# Patient Record
Sex: Female | Born: 1965 | Race: White | Hispanic: No | Marital: Married | Smoking: Former smoker
Health system: Southern US, Community
[De-identification: ages and names within clinical notes are randomized; demographics above are authoritative.]

## PROBLEM LIST (undated history)

## (undated) DIAGNOSIS — Z85850 Personal history of malignant neoplasm of thyroid: Secondary | ICD-10-CM

## (undated) DIAGNOSIS — E785 Hyperlipidemia, unspecified: Secondary | ICD-10-CM

## (undated) DIAGNOSIS — Z923 Personal history of irradiation: Secondary | ICD-10-CM

## (undated) DIAGNOSIS — R9389 Abnormal findings on diagnostic imaging of other specified body structures: Secondary | ICD-10-CM

## (undated) DIAGNOSIS — K219 Gastro-esophageal reflux disease without esophagitis: Secondary | ICD-10-CM

## (undated) DIAGNOSIS — Z9221 Personal history of antineoplastic chemotherapy: Secondary | ICD-10-CM

## (undated) DIAGNOSIS — F419 Anxiety disorder, unspecified: Secondary | ICD-10-CM

## (undated) DIAGNOSIS — E039 Hypothyroidism, unspecified: Secondary | ICD-10-CM

## (undated) DIAGNOSIS — C801 Malignant (primary) neoplasm, unspecified: Secondary | ICD-10-CM

## (undated) DIAGNOSIS — C20 Malignant neoplasm of rectum: Secondary | ICD-10-CM

## (undated) DIAGNOSIS — N840 Polyp of corpus uteri: Secondary | ICD-10-CM

## (undated) DIAGNOSIS — Z8639 Personal history of other endocrine, nutritional and metabolic disease: Secondary | ICD-10-CM

## (undated) MED FILL — Fluorouracil IV Soln 5 GM/100ML (50 MG/ML): INTRAVENOUS | Qty: 70 | Status: AC

---

## 1996-04-28 HISTORY — PX: LAPAROSCOPIC CHOLECYSTECTOMY: SUR755

## 1998-03-12 ENCOUNTER — Other Ambulatory Visit: Admission: RE | Admit: 1998-03-12 | Discharge: 1998-03-12 | Payer: Self-pay | Admitting: Obstetrics and Gynecology

## 2000-03-28 HISTORY — PX: TUBAL LIGATION: SHX77

## 2000-05-09 ENCOUNTER — Other Ambulatory Visit: Admission: RE | Admit: 2000-05-09 | Discharge: 2000-05-09 | Payer: Self-pay | Admitting: *Deleted

## 2000-06-20 ENCOUNTER — Observation Stay (HOSPITAL_COMMUNITY): Admission: AD | Admit: 2000-06-20 | Discharge: 2000-06-21 | Payer: Self-pay | Admitting: Obstetrics and Gynecology

## 2000-06-20 HISTORY — PX: OTHER SURGICAL HISTORY: SHX169

## 2000-07-19 ENCOUNTER — Ambulatory Visit (HOSPITAL_COMMUNITY): Admission: RE | Admit: 2000-07-19 | Discharge: 2000-07-19 | Payer: Self-pay | Admitting: Obstetrics and Gynecology

## 2000-10-10 ENCOUNTER — Ambulatory Visit (HOSPITAL_COMMUNITY): Admission: RE | Admit: 2000-10-10 | Discharge: 2000-10-10 | Payer: Self-pay | Admitting: Obstetrics and Gynecology

## 2000-12-26 ENCOUNTER — Inpatient Hospital Stay (HOSPITAL_COMMUNITY): Admission: AD | Admit: 2000-12-26 | Discharge: 2000-12-29 | Payer: Self-pay | Admitting: Obstetrics and Gynecology

## 2000-12-26 ENCOUNTER — Encounter (INDEPENDENT_AMBULATORY_CARE_PROVIDER_SITE_OTHER): Payer: Self-pay | Admitting: *Deleted

## 2001-02-02 ENCOUNTER — Other Ambulatory Visit: Admission: RE | Admit: 2001-02-02 | Discharge: 2001-02-02 | Payer: Self-pay | Admitting: Obstetrics and Gynecology

## 2002-11-28 ENCOUNTER — Ambulatory Visit (HOSPITAL_COMMUNITY): Admission: RE | Admit: 2002-11-28 | Discharge: 2002-11-28 | Payer: Self-pay | Admitting: Internal Medicine

## 2002-11-28 ENCOUNTER — Encounter: Payer: Self-pay | Admitting: Internal Medicine

## 2004-04-20 ENCOUNTER — Other Ambulatory Visit: Admission: RE | Admit: 2004-04-20 | Discharge: 2004-04-20 | Payer: Self-pay | Admitting: Obstetrics and Gynecology

## 2004-08-13 ENCOUNTER — Encounter (INDEPENDENT_AMBULATORY_CARE_PROVIDER_SITE_OTHER): Payer: Self-pay | Admitting: *Deleted

## 2004-08-13 ENCOUNTER — Ambulatory Visit (HOSPITAL_COMMUNITY): Admission: RE | Admit: 2004-08-13 | Discharge: 2004-08-13 | Payer: Self-pay | Admitting: Obstetrics and Gynecology

## 2004-08-13 HISTORY — PX: OTHER SURGICAL HISTORY: SHX169

## 2006-09-14 ENCOUNTER — Encounter (HOSPITAL_COMMUNITY): Admission: RE | Admit: 2006-09-14 | Discharge: 2006-10-14 | Payer: Self-pay | Admitting: Endocrinology

## 2007-07-24 ENCOUNTER — Ambulatory Visit (HOSPITAL_COMMUNITY): Admission: RE | Admit: 2007-07-24 | Discharge: 2007-07-24 | Payer: Self-pay | Admitting: Family Medicine

## 2007-07-26 ENCOUNTER — Ambulatory Visit (HOSPITAL_COMMUNITY): Admission: RE | Admit: 2007-07-26 | Discharge: 2007-07-26 | Payer: Self-pay | Admitting: Family Medicine

## 2010-01-27 ENCOUNTER — Ambulatory Visit (HOSPITAL_COMMUNITY): Admission: RE | Admit: 2010-01-27 | Discharge: 2010-01-27 | Payer: Self-pay | Admitting: Obstetrics and Gynecology

## 2010-08-13 NOTE — Op Note (Signed)
[facility]  Patient:    LAVAUGHN, BISIG                      MRN: [id number] Proc. Date: 06/20/00 Adm. Date:  [id number] Disc. Date: [id number] Attending:  Osborn Coho                           Operative Report  PREOPERATIVE DIAGNOSES:       1. Intrauterine pregnancy at 13 weeks estimated                                  gestational age.                               2. Incarcerated uterus.                               3. Urinary retention.  POSTOPERATIVE DIAGNOSES:      1. Intrauterine pregnancy at 13 weeks estimated                                  gestational age.                               2. Incarcerated uterus.                               3. Urinary retention.  PROCEDURE:                    Reduction of incarcerated uterus.  SURGEON:                      Mark E. Dareen Piano, M.D.  ANESTHESIA:                   Spinal.  ESTIMATED BLOOD LOSS:         Minimal.  DRAINS:                       Foley to bedside drainage.  COMPLICATIONS:                None.  INDICATIONS:                  The patient is a 45 year old white female, G4, P3, who presented to the office earlier today complaining of the inability to void since 3:00 a.m.  The patient was complaining of exquisite pain at that time.  She was felt to have an incarcerated uterus.  Her bladder was drained and attempts to release the incarcerated uterus in the office were made.  The patient left with a pessary in her vagina.  The patient tried to void later in the day, and the pessary fell out.  She had been  unable to void since being catheterized in the office this morning.  The patient again was becoming painful.  A Foley catheter was placed in the emergency room and 850 cc of urine was found.  Urinalysis was normal.  The patient had a retroverted uterus,  with the cervix being very anteverted up near the bladder neck.  DESCRIPTION OF PROCEDURE:     The patient was taken to  the operating room, where a spinal anesthetic was placed.  At that point, she was placed in the dorsal lithotomy position and prepped with Hibiclens.  She was draped with blue and green sheets.  At that point, a tenaculum was applied to the anterior cervical lip and a hand placed in the vagina.  The cervix was then pulled posteriorly with the hand elevating the fundus of the uterus.  The released the incarcerated uterus and the cervix was then placed posteriorly.  At that point, a sponge was placed in the vagina and the patient will be observed overnight with the Foley catheter.  Fetal heart tones were in the 160s at the conclusion of this procedure.  The patient tolerated the procedure well.  She was taken to the recovery room in stable condition. DD:  06/20/00 TD:  06/21/00 Job: [id number]

## 2010-08-13 NOTE — Op Note (Signed)
NAMEELIF, Samantha Reynolds               ACCOUNT NO.:  [id number]   MEDICAL RECORD NO.:  [id number]          PATIENT TYPE:  AMB   LOCATION:  SDC                           FACILITY:  [facility]   PHYSICIAN:  Michelle L. Grewal, M.D.DATE OF BIRTH:  1966-03-10   DATE OF PROCEDURE:  08/13/2004  DATE OF DISCHARGE:                                 OPERATIVE REPORT   PREOPERATIVE DIAGNOSIS:  Menorrhagia.   POSTOPERATIVE DIAGNOSIS:  Menorrhagia.   PROCEDURE:  Dilatation and curettage, hysteroscopy, ThermaChoice endometrial  ablation.   SURGEON:  Michelle L. Vincente Poli, M.D.   ANESTHESIA:  General.   PROCEDURE:  The patient was taken to the operating room.  She was intubated  without difficulty.  She was prepped and draped in the usual sterile  fashion.  An in-and-out catheter was used to empty the bladder.  Exam under  anesthesia revealed her uterus was approximately nine to 10 weeks' size and  retroverted.  A speculum was inserted into the vagina, the cervix was  grasped with a tenaculum, and a paracervical block was performed in the  standard fashion at 5 and 7 o'clock.  The uterus was gently sounded to 10 cm  and the [facility] dilators were then used to gently dilate the internal cervical  os.  The diagnostic hysteroscope was inserted one time without difficulty  with excellent visualization.  We noted that the patient had a very large  endometrial cavity that was tortuous.  She did have some polypoid areas kind  of down around near the lower uterine segment and the endocervix, but  otherwise I found no polyps or fibroids.  The hysteroscope was removed and a  thorough sharp uterine curettage was performed.  All tissue was sent to  pathology.  At that point, then the ThermaChoice array was inserted and  according to the manufacturer's specifications, we filled up the balloon  with approximately 15 mL of D5W.  Once we started the ablation cycle at the  point of three minutes' ablation, the machine read an  over-read area stating  overheat, and I discontinued the cycle.  I immediately removed the  balloon.  The balloon was intact.  We then at that point decided to use  another balloon.  A brand new ThermaChoice balloon was used and again  according to the manufacturer's specifications, I did insert it and prior to  even starting the ablation cycle, the machine immediately said over-read  error, overheat error, and I discontinued the procedure completely.  I do  not know if this error is due to an intrinsic problem with the generator  machine versus the possibility that she had such a large cavity that the  balloon was not able to contact completely with the endometrium.  I was  unable to get a normal ablation started.  That may have been the  possibility.  I do not think the patient had a uterine perforation with this  pressure maintained in the normal range throughout the entire procedure, and  the hysteroscope had a 40 mL deficit.  At the end of the procedure, there  was no  active bleeding noted.  She has the normal drainage which appeared to  be hysteroscopic fluid.  The intact balloon was removed a second time as  well.  All sponge, lap and instrument counts are correct x2.  After the  procedure, I went in and discussed the entire procedure with her husband.  Given that there was a three-minute ablation cycle, I feel that she may have  benefitted a little bit at least from the procedure, even though we were  unable to do the complete eight-minute ablation.  At the end of the  procedure, the patient was extubated and went to  the recovery room, where she was very comfortable.  She was given Toradol.  She will be discharged home with Darvocet.  She was advised to call should  she have any nausea, vomiting, severe abdominal pain or fever, and she will  follow up in two weeks for postop check.      MLG/MEDQ  D:  08/13/2004  T:  08/13/2004  Job:  [id number]

## 2010-08-13 NOTE — Op Note (Signed)
[facility]  Patient:    Samantha Reynolds, Samantha Reynolds Visit Number: [id number] MRN: [id number]          Service Type: OBS Location: 910A [id number] [id number] Attending Physician:  Marcelle Overlie Dictated by:   Miguel Aschoff, M.D. Proc. Date: 12/27/00 Admit Date:  12/26/2000                             Operative Report  PREOPERATIVE DIAGNOSIS:         Desired sterilization postpartum.  POSTOPERATIVE DIAGNOSIS:        Desired sterilization postpartum.  OPERATION:                      Postpartum [place] sterilization.  SURGEON:                        Miguel Aschoff, M.D.  ANESTHESIA:                     Epidural.  COMPLICATIONS:                  None.  INDICATIONS:                    The patient is a 45 year old old white female, gravida 5, para 4-0-1-4 who delivered on the morning of August 31, 2000.  The patient has requested that a sterilization be performed for socioeconomic reasons and has given her informed consent for bilateral tubal sterilization.  DESCRIPTION OF PROCEDURE:       After satisfactory epidural anesthesia a small infraumbilical incision was made after the patient had been prepped and draped in the usual sterile fashion.  Incision was extended down to subcutaneous tissue, until the fascia was identified. The fascia was then incised and the peritoneum revealed below.  The peritoneum was then entered carefully avoiding underlying structures.  Using [employer] retractors it was possible to bring the left tube into view.  The tube was traced back to its fimbriated end, then grasped on its mid portion and doubly ligated with two ligatures of 0 plain gut.  The area of tube above the ligatures was then excised and the tubal stumps were cauterized. Then the identical procedure was carried out on the right side again with the tube being identified and traced out to its fimbriated end and then again a loop being created in the mid portion and doubly ligated with two  ligatures of 0 plain gut.  After this was done inspection was made for hemostasis.  Hemostasis appeared to be excellent and at this point the abdomen was closed.  The parietal peritoneum was closed using pursestring suture of 0 Vicryl. The fascia was closed 1 interlocking 0 Vicryl.  The subcutaneous tissue was closed using an interrupted suture of 0 Vicryl and then the skin incision was closed using subcuticular 4.0 Vicryl. The estimated blood loss was less than 20 cc.  The patient tolerated the procedure well and went to the recovery room in satisfactory condition. Dictated by:   Miguel Aschoff, M.D. Attending Physician:  Marcelle Overlie DD:  12/27/00 TD:  12/27/00 Job: [id number]

## 2011-02-03 ENCOUNTER — Other Ambulatory Visit (HOSPITAL_COMMUNITY): Payer: Self-pay | Admitting: Obstetrics and Gynecology

## 2011-02-03 DIAGNOSIS — Z139 Encounter for screening, unspecified: Secondary | ICD-10-CM

## 2011-02-08 ENCOUNTER — Ambulatory Visit (HOSPITAL_COMMUNITY)
Admission: RE | Admit: 2011-02-08 | Discharge: 2011-02-08 | Disposition: A | Discharge status: Home / Self Care | Payer: 59 | Source: Ambulatory Visit | Attending: Obstetrics and Gynecology | Admitting: Obstetrics and Gynecology

## 2011-02-08 DIAGNOSIS — Z1231 Encounter for screening mammogram for malignant neoplasm of breast: Secondary | ICD-10-CM | POA: Insufficient documentation

## 2011-02-08 DIAGNOSIS — Z139 Encounter for screening, unspecified: Secondary | ICD-10-CM

## 2011-03-09 ENCOUNTER — Ambulatory Visit (HOSPITAL_COMMUNITY)
Admission: RE | Admit: 2011-03-09 | Discharge: 2011-03-09 | Disposition: A | Discharge status: Home / Self Care | Payer: 59 | Source: Ambulatory Visit | Attending: Podiatry | Admitting: Podiatry

## 2011-03-09 DIAGNOSIS — R262 Difficulty in walking, not elsewhere classified: Secondary | ICD-10-CM | POA: Insufficient documentation

## 2011-03-09 DIAGNOSIS — IMO0001 Reserved for inherently not codable concepts without codable children: Secondary | ICD-10-CM | POA: Insufficient documentation

## 2011-03-09 DIAGNOSIS — M25579 Pain in unspecified ankle and joints of unspecified foot: Secondary | ICD-10-CM | POA: Insufficient documentation

## 2011-03-09 DIAGNOSIS — M722 Plantar fascial fibromatosis: Secondary | ICD-10-CM | POA: Insufficient documentation

## 2011-03-09 NOTE — Progress Notes (Signed)
Physical Therapy Evaluation  Patient Details  Name: Samantha Reynolds MRN: [id number] Date of Birth: 02/12/1966  Today's Date: 03/09/2011 Time: 4098-1191 Time Calculation (min): 46 min Charges: 1 eval Visit#: 1  of 6   Re-eval: 04/08/11 Assessment Diagnosis: B plantar fascitis Next MD Visit: unscheduled  Prior Therapy: None  Subjective Symptoms/Limitations Symptoms: Pt reports that she had started notice pain to the bottom of her R foot about a year ago.  She has had conservative treatment including cortisone injections over the past year (3 to her L, 2 to her R).  Over the past year she started to notice more pain in her L foot.  She has increased pain when She has been educated on orthotics.  She has bought OTC insoles and new shoes.  Pt pain is 4-5/10 to her L foot, R no pain.  Range: 0-10/10.  Nature: Throbbing pain from middle of arch to back of her heel.  Aggravating: AM and over an hour of walking.  Alleviating: Ice, rest. She has not tried heating her foot.  Her goals are to reduce the pain and feel comfortable with her exercises so she does not re-injure herself.  Pain Assessment Currently in Pain?: Yes Pain Score:   5 Pain Location: Heel Pain Orientation: Right;Left Pain Type: Acute pain  Objective/Assessment Diagnosis B plantar fascitis Next MD Visit unscheduled  Prior Therapy None Prior Function Vocation Full time employment Vocation Requirements Works in admissions  Leisure Hobbies-yes (Comment) Comments She enjoys shopping, watching her children play sports at the college and high school level. Flexibility Hoovers Positive Thomas Positive 90/90 Positive Functional Tests Functional Tests Observation: Decreased (R>L) flexibility to her B: hamstrings, hip flexors, quadricpes, gastroc and solues complex Functional Tests Palpation: Increased pain with fascial restricitons to the medial insertion of plantar fascia on her L foot.  Without pain to her R plantar fascial  insertion  Increased calcaneal mobility. Functional Tests LEFS:  RLE Strength Right Ankle Dorsiflexion 5/5 Right Ankle Plantar Flexion 5/5 Right Ankle Inversion 4/5 Right Ankle Eversion 4/5 LLE Assessment LLE Assessment X LLE Strength LLE Overall Strength Comments ankle 5/5 thorughout   Exercise/Treatments Stretches Active Hamstring Stretch: 1 rep;30 seconds;Limitations Active Hamstring Stretch Limitations: BLE Passive Hamstring Stretch: 1 rep;30 seconds;Limitations Passive Hamstring Stretch Limitations: BLE long sitting on bed Hip Flexor Stretch: 1 rep;30 seconds;Limitations Hip Flexor Stretch Limitations: BLE with leg hanging of EOB Gastroc Stretch: 1 rep;30 seconds;Limitations Gastroc Stretch Limitations: BLE Soleus Stretch: 1 rep;30 seconds;Limitations Soleus Stretch Limitations: BLE Towel scrunches x10 minutes Physical Therapy Assessment and Plan PT Assessment and Plan Clinical Impression Statement: Pt is a 45 year old female who is referred to PT secondary to plantar fascial pain. After examination it was found that she has current body structure impairments including increased pain, decreased LE flexibility, impaired gait, and decreased percieved functional ability which is limiting her ability to fully participate in her everyday activities.  She will benefit from skilled OPPT in order to address the above impairments in order to maximize function and improve quality of life.  PT Frequency: Min 2X/week PT Duration: Other (comment) (3 weeks) PT Treatment/Interventions: Gait training;Stair training;Functional mobility training;Therapeutic activities;Therapeutic exercise;Neuromuscular re-education;Patient/family education;Other (comment) (Manual techniques, MYK, and modalities for pain control) PT Plan: MYK techniques, modalities for pain control, ankle t-band, towel inversion/eversion as needed.     Goals Home Exercise Program Pt will Perform Home Exercise Program:  Independently PT Short Term Goals Time to Complete Short Term Goals: 3 weeks PT Short Term Goal 1:  Pt will report pain less than or equal to 3/10 for 75% of her day while walking for greater then an hour for improved quality of life.  PT Short Term Goal 2: Pt will improve her LE flexibility and decrease her fascial restrictions to her plantar region to tolerate walking for greater than an hour without pain.   PT Short Term Goal 3: Pt will improve her LEFS by 9 points for improved percieved functional ability.   Problem List Patient Active Problem List  Diagnoses  . Plantar fascial fibromatosis  . Pain in joint, ankle and foot    PT - End of Session Activity Tolerance: Patient tolerated treatment well   Constantin Hillery 03/09/2011, 7:08 PM  Physician Documentation Your signature is required to indicate approval of the treatment plan as stated above.  Please sign and either send electronically or make a copy of this report for your files and return this physician signed original.   Please mark one 1.__approve of plan  2. ___approve of plan with the following conditions.   ______________________________                                                          _____________________ Physician Signature                                                                                                             Date

## 2011-03-15 ENCOUNTER — Ambulatory Visit (HOSPITAL_COMMUNITY)
Admission: RE | Admit: 2011-03-15 | Discharge: 2011-03-15 | Disposition: A | Discharge status: Home / Self Care | Payer: 59 | Source: Ambulatory Visit | Attending: Podiatry | Admitting: Podiatry

## 2011-03-15 NOTE — Progress Notes (Signed)
Physical Therapy Treatment Patient Details  Name: Samantha Reynolds MRN: [id number] Date of Birth: February 18, 1966  Today's Date: 03/15/2011 Time: 5621-3086 Time Calculation (min): 42 min Visit#: 2  of 6   Re-eval: 04/08/11 Charges: Therex x 38'  Subjective: Symptoms/Limitations Symptoms: Pt reports that she is only having in the left foot. Pt reprots taht she may have overdone it with the exercises. Pain Assessment Currently in Pain?: Yes Pain Score:   8 Pain Location: Ankle Pain Orientation: Left;Posterior   Exercise/Treatments Stretches Active Hamstring Stretch: 2 reps;30 seconds Active Hamstring Stretch Limitations: BLE Passive Hamstring Stretch: 2 reps;30 seconds Gastroc Stretch: 2 reps;30 seconds Gastroc Stretch Limitations: BLE Soleus Stretch: 2 reps;30 seconds Soleus Stretch Limitations: BLE Standing Other Standing Knee Exercises: Plantar stretch 2x30" Grn tband ankle all directions x 10 each  Physical Therapy Assessment and Plan PT Assessment and Plan Clinical Impression Statement: Pt completes stretches and new exercises with minimal difficulty and minimal need for cueing. Began plantar stretch on step to decrease plantar fascia tightness. BEgan ankle tband exercises to increase ankle strenght/stability. Pt reports pain decrease to 4/10 at end of session. PT Plan: Continue to progress per PT POC. Address need for manual techniques to decrease pain/tightness next session.     Problem List Patient Active Problem List  Diagnoses  . Plantar fascial fibromatosis  . Pain in joint, ankle and foot    PT - End of Session Activity Tolerance: Patient tolerated treatment well General Behavior During Session: [facility] for tasks performed Cognition: [facility] for tasks performed  Antonieta Iba 03/15/2011, 5:36 PM

## 2011-03-17 ENCOUNTER — Ambulatory Visit (HOSPITAL_COMMUNITY)
Admission: RE | Admit: 2011-03-17 | Discharge: 2011-03-17 | Disposition: A | Discharge status: Home / Self Care | Payer: 59 | Source: Ambulatory Visit | Attending: Podiatry | Admitting: Podiatry

## 2011-03-17 NOTE — Progress Notes (Signed)
Physical Therapy Treatment Patient Details  Name: Samantha Reynolds MRN: [id number] Date of Birth: 1965-12-10  Today's Date: 03/17/2011 Time: 4098-1191 Time Calculation (min): 36 min Visit#: 3  of 6   Re-eval: 04/08/11  Charge: therex: 21 min [name] 5 min  Manual 10 min  Subjective: Symptoms/Limitations Symptoms: The soreness is decreasing but her pain remains the same.   Pain Assessment Pain Score:   5 Pain Location: Foot Pain Orientation: Left Pain Type: Acute pain  Objective:  Exercise/Treatments Ankle Exercises Ankle Plantar Flexion:  (Plantar Fascia stretch 1x 1 min) Heel Raises:  (Slant board stretch 1'x 2)  Towel Inversion/Eversion:  (sidelying in and eversion with 3# x 10)  Modalities Modalities: Ultrasound Manual Therapy Manual Therapy: Other (comment) Other Manual Therapy: massage to forefoot to decrease tension. Ultrasound Ultrasound Location: medial aspect of L hind foot. Ultrasound Parameters: 1.2 w/cm2; 3 MgHz x 5 min.  Physical Therapy Assessment and Plan PT Assessment and Plan Clinical Impression Statement: Changed modalities to US/STM to gastroc and plantar surface of foot.  Tightness resolved medial gastroc region, decreased adhesions plantar surface following STM.  PT Plan: assess pain releif following session.  Continue progressing per POC.    Goals    Problem List Patient Active Problem List  Diagnoses  . Plantar fascial fibromatosis  . Pain in joint, ankle and foot    PT - End of Session Activity Tolerance: Patient tolerated treatment well General Behavior During Session: [facility] for tasks performed Cognition: [facility] for tasks performed  Juel Burrow 03/17/2011, 5:46 PM

## 2011-03-23 ENCOUNTER — Ambulatory Visit (HOSPITAL_COMMUNITY)
Admission: RE | Admit: 2011-03-23 | Discharge: 2011-03-23 | Disposition: A | Discharge status: Home / Self Care | Payer: 59 | Source: Ambulatory Visit | Attending: Family Medicine | Admitting: Family Medicine

## 2011-03-23 NOTE — Progress Notes (Signed)
Physical Therapy Treatment Patient Details  Name: Samantha Reynolds MRN: [id number] Date of Birth: 04-20-1965  Today's Date: 03/23/2011 Time: 4098-1191 Time Calculation (min): 35 min Visit#: 4  of 8   Re-eval: 04/08/11  Charge: therex 15 min [name] 10 min Manual 10 min  Subjective: Symptoms/Limitations Symptoms: Pt stated she had less pain following last session but unable to report if the relief lasted.  She was feeling good on Friday but up on feet all weekend for Christmas and has increased pain now LLE 5/10 R 2/10.  Pt wearing new pair of [employer] running shoes she reported they are specialiized for plantar fascia with orthotics stated they are helping with pain. Pain Assessment Currently in Pain?: Yes Pain Score:   5 Pain Location: Foot Pain Orientation: Left;Right  Objective:  Exercise/Treatments Stretches Gastroc Stretch: 3 reps;30 seconds Gastroc Stretch Limitations: BLE Soleus Stretch: 30 seconds;3 reps Soleus Stretch Limitations: BLE Standing Other Standing Knee Exercises: Plantar stretch 3.x30"   Modalities Modalities: Ultrasound Manual Therapy Other Manual Therapy: message gastroc to forefoot to decrease tension. Ultrasound Ultrasound Location: medial aspect of B hind foot Ultrasound Parameters: 3 MHz continous 1.2 w/cm2 x 5 min each Ultrasound Goals: Pain  Physical Therapy Assessment and Plan PT Assessment and Plan Clinical Impression Statement: Continued with US/STM modalitiy to gastroc and plantar surface of both feet and stretches to reduce tightness.  Pt stated pain relieved at end of session. PT Plan: Continue progressing per POC.    Goals    Problem List Patient Active Problem List  Diagnoses  . Plantar fascial fibromatosis  . Pain in joint, ankle and foot    PT - End of Session Activity Tolerance: Patient tolerated treatment well General Behavior During Session: [facility] for tasks performed Cognition: [facility] for tasks performed  Juel Burrow 03/23/2011, 5:52 PM

## 2011-03-24 ENCOUNTER — Ambulatory Visit (HOSPITAL_COMMUNITY)
Admission: RE | Admit: 2011-03-24 | Discharge: 2011-03-24 | Disposition: A | Discharge status: Home / Self Care | Payer: 59 | Source: Ambulatory Visit | Attending: Family Medicine | Admitting: Family Medicine

## 2011-03-24 NOTE — Progress Notes (Signed)
Physical Therapy Treatment Patient Details  Name: Samantha Reynolds MRN: [id number] Date of Birth: [birth year]  Today's Date: 03/24/2011 Time: 1650-1730 Time Calculation (min): 40 min Visit#: 5  of 8   Re-eval: 04/08/11  Charge: therex [name] 10 min Manual 10 min  Subjective: Symptoms/Limitations Symptoms: Was feeling good following last session with the [name], my R foot was totally pain free but noticed L foot hurt on the outside.  I wasn't on my feet as much as I thought I was going to be (first day back to work since Christmas break).  Pain scale R 1/10, L 5/10. Pain Assessment Currently in Pain?: Yes Pain Score:   5 Pain Location: Foot Pain Orientation: Left Multiple Pain Sites: Yes  Objective:  Exercise/Treatments Stretches Gastroc Stretch: 3 reps;30 seconds Gastroc Stretch Limitations: BLE Soleus Stretch: 30 seconds;3 reps Soleus Stretch Limitations: BLE Standing Other Standing Knee Exercises: Plantar stretch 3.x30"   Modalities Modalities: Ultrasound Ultrasound Ultrasound Location: B LE heels Ultrasound Parameters: 3 MHz continous 1.2 w/cm2 x 5 min each foot Ultrasound Goals: Pain  Physical Therapy Assessment and Plan PT Assessment and Plan Clinical Impression Statement: Continued with US/STM modalitiy to gastroc and plantar surface of both feet and stretches to reduce tightness. Able to reduce tightness B gastrocs with manual STM.  Pt stated pain relieved at end of session. PT Plan: Begin SLS barefoot on foam to help increase intrinsic muscular support next session    Goals Home Exercise Program Pt will Perform Home Exercise Program: Independently PT Short Term Goals Time to Complete Short Term Goals: 3 weeks PT Short Term Goal 1: Pt will report pain less than or equal to 3/10 for 75% of her day while walking for greater then an hour for improved quality of life.  PT Short Term Goal 2: Pt will improve her LE flexibility and decrease her fascial restrictions to  her plantar region to tolerate walking for greater than an hour without pain.   PT Short Term Goal 3: Pt will improve her LEFS by 9 points for improved percieved functional ability.   Problem List Patient Active Problem List  Diagnoses  . Plantar fascial fibromatosis  . Pain in joint, ankle and foot    PT - End of Session Activity Tolerance: Patient tolerated treatment well General Cognition: [facility] for tasks performed  Juel Burrow 03/24/2011, 5:33 PM

## 2011-03-25 ENCOUNTER — Ambulatory Visit (HOSPITAL_COMMUNITY): Payer: 59

## 2011-03-29 DIAGNOSIS — C801 Malignant (primary) neoplasm, unspecified: Secondary | ICD-10-CM

## 2011-03-29 HISTORY — DX: Malignant (primary) neoplasm, unspecified: C80.1

## 2011-03-30 ENCOUNTER — Ambulatory Visit (HOSPITAL_COMMUNITY)
Admission: RE | Admit: 2011-03-30 | Discharge: 2011-03-30 | Disposition: A | Discharge status: Home / Self Care | Payer: 59 | Source: Ambulatory Visit | Attending: Podiatry | Admitting: Podiatry

## 2011-03-30 DIAGNOSIS — R262 Difficulty in walking, not elsewhere classified: Secondary | ICD-10-CM | POA: Insufficient documentation

## 2011-03-30 DIAGNOSIS — IMO0001 Reserved for inherently not codable concepts without codable children: Secondary | ICD-10-CM | POA: Insufficient documentation

## 2011-03-30 DIAGNOSIS — M25579 Pain in unspecified ankle and joints of unspecified foot: Secondary | ICD-10-CM | POA: Insufficient documentation

## 2011-03-30 NOTE — Progress Notes (Signed)
Physical Therapy Treatment Patient Details  Name: Samantha Reynolds MRN: [id number] Date of Birth: Nov 29, 1965  Today's Date: 03/30/2011 Time: 7829-5621 Time Calculation (min): 39 min Visit#: 6  of 8   Re-eval: 04/08/11  Charge: therex 19 min [name] 10 min Manual 10 min  Subjective: Symptoms/Limitations Symptoms: Pt stated she's feeling good today, have taken it easy last couple days.  She reported feet felt good following [name] last session.  Pain scale R 1/10, L 3/10.  Objective:   Exercise/Treatments  Stretches Gastroc Stretch: 3 reps;30 seconds Gastroc Stretch Limitations: BLE Soleus Stretch: 30 seconds;3 reps Soleus Stretch Limitations: BLE Standing SLS: SLS barefoot on foam 49" B max of 4 Ankle Exercises Ankle Plantar Flexion: Standing;Limitations Ankle Plantar Flexion Limitations: 3x 30" plantar fascia st.   SLS: SLS barefoot on foam 49" B max of 4  Modalities Modalities: Ultrasound Manual Therapy Other Manual Therapy: message gastroc to forefoot to decrease tension Ultrasound Ultrasound Location: B LE Heels Ultrasound Parameters: 3 MHz continous 1.2 w/cm2 x 5 min each foot Ultrasound Goals: Pain  Physical Therapy Assessment and Plan PT Assessment and Plan Clinical Impression Statement: Added SLS on foam to increase intrinsic muscular support, pt demonstrated with good ankle strategy with increased time each attempts.   PT Plan: Assess pain relief following treatment.    Goals    Problem List Patient Active Problem List  Diagnoses  . Plantar fascial fibromatosis  . Pain in joint, ankle and foot    PT - End of Session Activity Tolerance: Patient tolerated treatment well General Behavior During Session: [facility] for tasks performed Cognition: [facility] for tasks performed  Juel Burrow 03/30/2011, 5:39 PM

## 2011-03-31 MED ORDER — EPINEPHRINE HCL 1 MG/ML IJ SOLN
INTRAMUSCULAR | Status: AC
  Filled 2011-03-31: qty 1

## 2011-04-01 ENCOUNTER — Ambulatory Visit (HOSPITAL_COMMUNITY)
Admission: RE | Admit: 2011-04-01 | Discharge: 2011-04-01 | Disposition: A | Discharge status: Home / Self Care | Payer: 59 | Source: Ambulatory Visit | Attending: Family Medicine | Admitting: Family Medicine

## 2011-04-01 NOTE — Progress Notes (Signed)
Physical Therapy Treatment Patient Details  Name: CONCHETTA LAMIA MRN: [id number] Date of Birth: 1965/07/31  Today's Date: 04/01/2011 Time: 4098-1191 Time Calculation (min): 58 min Visit#: 7  of 8   Re-eval: 04/08/11  Charge: Therex 38 min [name] 10 min Manual 5 min Self care home management 5 min  Subjective: Symptoms/Limitations Symptoms: Pt stated she was surprised at how well her feet felt after standing for long periods last night.  Pain scale R 1/10, L 4/10. Pain Assessment Currently in Pain?: Yes Pain Score:   4 Pain Location: Foot Pain Orientation: Left  Objective:   Exercise/Treatments Stretches Gastroc Stretch: 3 reps;30 seconds Gastroc Stretch Limitations: BLE Soleus Stretch: 30 seconds;3 reps Soleus Stretch Limitations: BLE Standing SLS: SLS barefoot on foam R 49" L 46" max of 3 Other Standing Knee Exercises: Plantar stretch 3.x30" Other Standing Knee Exercises: Instructed standing tennis ball massage x 5 min each Manual Therapy Other Manual Therapy: Massage to L lateral gastroc to forefoot to decrease tension x 5 min Ultrasound Ultrasound Location: B LE Heels Ultrasound Parameters: 3 MHz continous 1.2 w/cm2 x 5 min each foot  Ultrasound Goals: Pain  Physical Therapy Assessment and Plan PT Assessment and Plan Clinical Impression Statement: Instructed tennis ball massage to add to HEP, pt stated relief following.  Written instructions given to pt for proper tech.  L lateral gastroc decreased tension palpated at end of session following US/ STM. PT Plan: Reassess next session.    Goals    Problem List Patient Active Problem List  Diagnoses  . Plantar fascial fibromatosis  . Pain in joint, ankle and foot    PT - End of Session Activity Tolerance: Patient tolerated treatment well General Behavior During Session: [facility] for tasks performed Cognition: [facility] for tasks performed  Juel Burrow 04/01/2011, 6:12 PM

## 2011-04-05 ENCOUNTER — Ambulatory Visit (HOSPITAL_COMMUNITY): Payer: 59

## 2011-04-07 ENCOUNTER — Ambulatory Visit (HOSPITAL_COMMUNITY): Payer: 59

## 2011-09-22 ENCOUNTER — Encounter (INDEPENDENT_AMBULATORY_CARE_PROVIDER_SITE_OTHER): Payer: Self-pay

## 2011-09-26 ENCOUNTER — Encounter (INDEPENDENT_AMBULATORY_CARE_PROVIDER_SITE_OTHER): Payer: Self-pay

## 2011-10-27 ENCOUNTER — Encounter (INDEPENDENT_AMBULATORY_CARE_PROVIDER_SITE_OTHER): Payer: Self-pay | Admitting: Surgery

## 2011-10-31 ENCOUNTER — Ambulatory Visit (INDEPENDENT_AMBULATORY_CARE_PROVIDER_SITE_OTHER): Payer: Commercial Managed Care - PPO | Admitting: Surgery

## 2011-10-31 ENCOUNTER — Encounter (INDEPENDENT_AMBULATORY_CARE_PROVIDER_SITE_OTHER): Payer: Self-pay | Admitting: Surgery

## 2011-10-31 VITALS — BP 142/82 | HR 72 | Temp 97.3°F | Resp 16 | Ht 67.0 in | Wt 186.1 lb

## 2011-10-31 DIAGNOSIS — D440 Neoplasm of uncertain behavior of thyroid gland: Secondary | ICD-10-CM | POA: Insufficient documentation

## 2011-10-31 DIAGNOSIS — D449 Neoplasm of uncertain behavior of unspecified endocrine gland: Secondary | ICD-10-CM

## 2011-10-31 NOTE — Progress Notes (Signed)
General Surgery - [facility]  Chief Complaint  Patient presents with  . New Evaluation    thyroid nodule with atypia left lobe - referral from Dr. Sam Morayati, [facility]    HISTORY: The patient is a 45-year-old white female referred by her endocrinologist for left thyroid lobectomy for diagnosis of a left thyroid nodule with cytologic atypia. 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 She recently started on Armour Thyroid in May of 2013. She has had no prior surgery on the head or neck. Patient underwent ultrasound in May 2013. This showed a 9 mm nodule in the left thyroid lobe which was a new finding. Patient underwent fine-needle aspiration which showed a follicular lesion with cytologic atypia. Further testing raised a suspicion of malignancy. Left thyroid lobectomy was recommended for definitive diagnosis. Ultrasound does show a 3 mm nodule in the right lobe and a poorly defined hyperechoic area in the central portion of the right lobe.  There is a family history of thyroidectomy in a paternal grandmother for unknown cause. There is no family history of thyroid malignancy. There is no family history of other endocrinopathy.  Past Medical History  Diagnosis Date  . Thyroid disease      Current Outpatient Prescriptions  Medication Sig Dispense Refill  . piroxicam (FELDENE) 20 MG capsule Take 20 mg by mouth daily.      . thyroid (ARMOUR) 60 MG tablet Take 60 mg by mouth daily.         Allergies  Allergen Reactions  . Demerol (Meperidine) Itching    All over the body.  . Penicillins Hives    Happened in childhood, does not remember if it spread all over the body or not.     Family History  Problem Relation Age of Onset  . Cancer Maternal Aunt     spine/back  . Cancer Paternal Grandfather     lung     History   Social History  . Marital Status: Married    Spouse Name: N/A    Number of  Children: N/A  . Years of Education: N/A   Social History Main Topics  . Smoking status: Former Smoker    Quit date: 10/31/1991  . Smokeless tobacco: Never Used  . Alcohol Use: No  . Drug Use: No  . Sexually Active: None   Other Topics Concern  . None   Social History Narrative  . None     REVIEW OF SYSTEMS - PERTINENT POSITIVES ONLY: Denies tremor. Denies palpitation. Denies compressive symptoms.  EXAM: Filed Vitals:   10/31/11 1336  BP: 142/82  Pulse: 72  Temp: 97.3 F (36.3 C)  Resp: 16    HEENT: normocephalic; pupils equal and reactive; sclerae clear; dentition good; mucous membranes moist NECK:  Firm thyroid gland with right lobe slightly larger than left, no discrete palpable nodules; symmetric on extension; no palpable anterior or posterior cervical lymphadenopathy; no supraclavicular masses; no tenderness CHEST: clear to auscultation bilaterally without rales, rhonchi, or wheezes CARDIAC: regular rate and rhythm without significant murmur; peripheral pulses are full EXT:  non-tender without edema; no deformity NEURO: no gross focal deficits; no sign of tremor   LABORATORY RESULTS: See [facility] (CHL-Epic) for most recent results   RADIOLOGY RESULTS: See [facility] (CHL-Epic) for most recent results   IMPRESSION: #1 left thyroid nodule, 9 mm, with cytologic atypia on fine needle aspiration biopsy #2 history of thyroiditis  PLAN: The patient   and I and her husband discussed all of the above findings at length. I agree with left thyroid lobectomy for definitive diagnosis based on the cytopathology findings. I have offered total thyroidectomy given that there are nodules in the right lobe and that the patient is already taking thyroid hormone supplementation. We discussed the pros and cons of lobectomy versus total thyroidectomy. Patient would like to proceed with left thyroid lobectomy, and consider proceeding with right thyroid lobectomy only in  the event of malignancy or other significant abnormality.  We discussed the procedure, the hospital stay, and the postoperative recovery. We discussed the likely need for lifelong thyroid hormone replacement therapy. We discussed potential risk including recurrent nerve injury and injury to parathyroid glands. They understand and wish to proceed with surgery.  The risks and benefits of the procedure have been discussed at length with the patient.  The patient understands the proposed procedure, potential alternative treatments, and the course of recovery to be expected.  All of the patient's questions have been answered at this time.  The patient wishes to proceed with surgery.  Jayvyn Haselton M. Eden Toohey, MD, FACS General & Endocrine Surgery [facility]   Visit Diagnoses: 1. Neoplasm of uncertain behavior of thyroid gland, left lobe     Primary Care Physician: LUKING,SCOTT, MD   

## 2011-10-31 NOTE — Patient Instructions (Signed)
[facility] [phone]  THYROID & PARATHYROID SURGERY -- POST OP INSTRUCTIONS  Always review your discharge instruction sheet from the facility where your surgery was performed.  1. A prescription for pain medication may be given to you upon discharge.  Take your pain medication as prescribed.  If narcotic pain medicine is not needed, then you may take acetaminophen (Tylenol) or ibuprofen (Advil) as needed. 2. Take your usually prescribed medications unless otherwise directed. 3. If you need a refill on your pain medication, please contact your pharmacy. They will contact our office to request authorization.  Prescriptions will not be processed after 5 pm or on weekends. 4. Start with a light diet upon arrival home, such as soup and crackers or toast.  Be sure to drink plenty of fluids daily.  Resume your normal diet the day after surgery. 5. Most patients will experience some swelling and bruising on the chest and neck area.  Ice packs will help.  Swelling and bruising can take several days to resolve.  6. It is common to experience some constipation if taking pain medication after surgery.  Increasing fluid intake and taking a stool softener will usually help or prevent this problem.  A mild laxative (Milk of Magnesia or Miralax) should be taken according to package directions if there are no bowel movements after 48 hours. 7. You may remove your bandages 24-48 hours after surgery, and you may shower at that time.  You have steri-strips (small skin tapes) in place directly over the incision.  These strips should be left on the skin for 7-10 days and then removed. 8. You may resume regular (light) daily activities beginning the next day-such as daily self-care, walking, climbing stairs-gradually increasing activities as tolerated.  You may have sexual intercourse when it is comfortable.  Refrain from any heavy lifting or straining until approved by your doctor.  You may drive when  you no longer are taking prescription pain medication, you can comfortably wear a seatbelt, and you can safely maneuver your car and apply brakes. 9. You should see your doctor in the office for a follow-up appointment approximately two to three weeks after your surgery.  Make sure that you call for this appointment within a day or two after you arrive home to insure a convenient appointment time.  WHEN TO CALL YOUR DOCTOR: 1. Fever greater than 101.5 2. Inability to urinate 3. Nausea and/or vomiting - persistent 4. Extreme swelling or bruising 5. Continued bleeding from incision 6. Increased pain, redness, or drainage from the incision 7. Difficulty swallowing or breathing 8. Muscle cramping or spasms 9. Numbness or tingling in hands or around lips  The clinic staff is available to answer your questions during regular business hours.  Please don't hesitate to call and ask to speak to one of the nurses if you have concerns. [facility] [phone]  [email or web address]   

## 2011-11-09 ENCOUNTER — Encounter (HOSPITAL_COMMUNITY): Payer: Self-pay

## 2011-11-16 ENCOUNTER — Encounter (INDEPENDENT_AMBULATORY_CARE_PROVIDER_SITE_OTHER): Payer: Self-pay

## 2011-11-17 ENCOUNTER — Encounter (INDEPENDENT_AMBULATORY_CARE_PROVIDER_SITE_OTHER): Payer: Self-pay

## 2011-11-18 ENCOUNTER — Encounter (HOSPITAL_COMMUNITY)
Admission: RE | Admit: 2011-11-18 | Discharge: 2011-11-18 | Disposition: A | Discharge status: Home / Self Care | Payer: 59 | Source: Ambulatory Visit | Attending: Surgery | Admitting: Surgery

## 2011-11-18 ENCOUNTER — Encounter (HOSPITAL_COMMUNITY): Payer: Self-pay

## 2011-11-18 ENCOUNTER — Ambulatory Visit (HOSPITAL_COMMUNITY)
Admission: RE | Admit: 2011-11-18 | Discharge: 2011-11-18 | Disposition: A | Discharge status: Home / Self Care | Payer: 59 | Source: Ambulatory Visit | Attending: Surgery | Admitting: Surgery

## 2011-11-18 DIAGNOSIS — E041 Nontoxic single thyroid nodule: Secondary | ICD-10-CM | POA: Insufficient documentation

## 2011-11-18 DIAGNOSIS — Z01812 Encounter for preprocedural laboratory examination: Secondary | ICD-10-CM | POA: Insufficient documentation

## 2011-11-18 DIAGNOSIS — M948X9 Other specified disorders of cartilage, unspecified sites: Secondary | ICD-10-CM | POA: Insufficient documentation

## 2011-11-18 LAB — CBC
HCT: 35.3 % — ABNORMAL LOW (ref 36.0–46.0)
Hemoglobin: 12.5 g/dL (ref 12.0–15.0)
MCV: 93.6 fL (ref 78.0–100.0)
RDW: 12.7 % (ref 11.5–15.5)
WBC: 4.3 10*3/uL (ref 4.0–10.5)

## 2011-11-18 LAB — BASIC METABOLIC PANEL
BUN: 7 mg/dL (ref 6–23)
Chloride: 102 mEq/L (ref 96–112)
Creatinine, Ser: 0.79 mg/dL (ref 0.50–1.10)
Glucose, Bld: 88 mg/dL (ref 70–99)
Potassium: 3.8 mEq/L (ref 3.5–5.1)

## 2011-11-18 LAB — SURGICAL PCR SCREEN: Staphylococcus aureus: NEGATIVE

## 2011-11-18 NOTE — Patient Instructions (Signed)
YOUR SURGERY IS SCHEDULED ON:  Thursday  8/29  AT 7:30 AM  REPORT TO [place] [facility] AT:  5:30 AM      PHONE # FOR SHORT STAY IS [phone]  DO NOT EAT OR DRINK ANYTHING AFTER MIDNIGHT THE NIGHT BEFORE YOUR SURGERY.  YOU MAY BRUSH YOUR TEETH, RINSE OUT YOUR MOUTH--BUT NO WATER, NO FOOD, NO CHEWING GUM, NO MINTS, NO CANDIES, NO CHEWING TOBACCO.  PLEASE TAKE THE FOLLOWING MEDICATIONS THE AM OF YOUR SURGERY WITH A FEW SIPS OF WATER:  THYROID MEDICINE    IF YOU USE INHALERS--USE YOUR INHALERS THE AM OF YOUR SURGERY AND BRING INHALERS TO THE HOSPITAL -TAKE TO SURGERY.    IF YOU ARE DIABETIC:  DO NOT TAKE ANY DIABETIC MEDICATIONS THE AM OF YOUR SURGERY.  IF YOU TAKE INSULIN IN THE EVENINGS--PLEASE ONLY TAKE 1/2 NORMAL EVENING DOSE THE NIGHT BEFORE YOUR SURGERY.  NO INSULIN THE AM OF YOUR SURGERY.  IF YOU HAVE SLEEP APNEA AND USE CPAP OR BIPAP--PLEASE BRING THE MASK --NOT THE MACHINE-NOT THE TUBING   -JUST THE MASK. DO NOT BRING VALUABLES, MONEY, CREDIT CARDS.  CONTACT LENS, DENTURES / PARTIALS, GLASSES SHOULD NOT BE WORN TO SURGERY AND IN MOST CASES-HEARING AIDS WILL NEED TO BE REMOVED.  BRING YOUR GLASSES CASE, ANY EQUIPMENT NEEDED FOR YOUR CONTACT LENS. FOR PATIENTS ADMITTED TO THE HOSPITAL--CHECK OUT TIME THE DAY OF DISCHARGE IS 11:00 AM.  ALL INPATIENT ROOMS ARE PRIVATE - WITH BATHROOM, TELEPHONE, TELEVISION AND WIFI INTERNET. IF YOU ARE BEING DISCHARGED THE SAME DAY OF YOUR SURGERY--YOU CAN NOT DRIVE YOURSELF HOME--AND SHOULD NOT GO HOME ALONE BY TAXI OR BUS.  NO DRIVING OR OPERATING MACHINERY FOR 24 HOURS FOLLOWING ANESTHESIA / PAIN MEDICATIONS.                            SPECIAL INSTRUCTIONS:  CHLORHEXIDINE SOAP SHOWER (other brand names are Betasept and Hibiclens ) PLEASE SHOWER WITH CHLORHEXIDINE THE NIGHT BEFORE YOUR SURGERY AND THE AM OF YOUR SURGERY. DO NOT USE CHLORHEXIDINE ON YOUR FACE OR PRIVATE AREAS--YOU MAY USE YOUR NORMAL SOAP THOSE AREAS AND YOUR NORMAL SHAMPOO.    WOMEN SHOULD AVOID SHAVING UNDER ARMS AND SHAVING LEGS 48 HOURS BEFORE USING CHLORHEXIDINE TO AVOID SKIN IRRITATION.  DO NOT USE IF ALLERGIC TO CHLORHEXIDINE.  PLEASE READ OVER ANY  FACT SHEETS THAT YOU WERE GIVEN: MRSA INFORMATION

## 2011-11-18 NOTE — Progress Notes (Signed)
Quick Note:  These results are acceptable for scheduled surgery.  Marnesha Gagen M. Shamone Winzer, MD, FACS [facility] Office: [phone]   ______ 

## 2011-11-18 NOTE — Pre-Procedure Instructions (Signed)
PREOP CBC, BMET AND CXR WERE DONE TODAY AT [facility] AS PER ANESTHESIOLOGIST'S GUIDELINES. PREOP INSTRUCTIONS DISCUSSED WITH PT USING TEACH BACK METHOD.

## 2011-11-24 ENCOUNTER — Ambulatory Visit (HOSPITAL_COMMUNITY): Payer: 59 | Admitting: Anesthesiology

## 2011-11-24 ENCOUNTER — Observation Stay (HOSPITAL_COMMUNITY)
Admission: RE | Admit: 2011-11-24 | Discharge: 2011-11-25 | Disposition: A | Discharge status: Home / Self Care | Payer: 59 | Source: Ambulatory Visit | Attending: Surgery | Admitting: Surgery

## 2011-11-24 ENCOUNTER — Encounter (HOSPITAL_COMMUNITY)
Admission: RE | Disposition: A | Discharge status: Home / Self Care | Payer: Self-pay | Source: Ambulatory Visit | Attending: Surgery

## 2011-11-24 ENCOUNTER — Encounter (HOSPITAL_COMMUNITY): Payer: Self-pay | Admitting: Anesthesiology

## 2011-11-24 ENCOUNTER — Encounter (HOSPITAL_COMMUNITY): Payer: Self-pay | Admitting: *Deleted

## 2011-11-24 DIAGNOSIS — Z79899 Other long term (current) drug therapy: Secondary | ICD-10-CM | POA: Insufficient documentation

## 2011-11-24 DIAGNOSIS — C73 Malignant neoplasm of thyroid gland: Secondary | ICD-10-CM

## 2011-11-24 DIAGNOSIS — E065 Other chronic thyroiditis: Secondary | ICD-10-CM | POA: Insufficient documentation

## 2011-11-24 DIAGNOSIS — D440 Neoplasm of uncertain behavior of thyroid gland: Secondary | ICD-10-CM | POA: Diagnosis present

## 2011-11-24 DIAGNOSIS — E041 Nontoxic single thyroid nodule: Secondary | ICD-10-CM | POA: Insufficient documentation

## 2011-11-24 HISTORY — PX: THYROID LOBECTOMY: SHX420

## 2011-11-24 SURGERY — LOBECTOMY, THYROID
Anesthesia: General | Site: Neck | Laterality: Left | Wound class: Clean

## 2011-11-24 MED ORDER — CISATRACURIUM BESYLATE (PF) 10 MG/5ML IV SOLN
INTRAVENOUS | Status: DC | PRN
  Administered 2011-11-24: 4 mg via INTRAVENOUS

## 2011-11-24 MED ORDER — CIPROFLOXACIN IN D5W 400 MG/200ML IV SOLN
INTRAVENOUS | Status: AC
  Filled 2011-11-24: qty 200

## 2011-11-24 MED ORDER — ONDANSETRON HCL 4 MG PO TABS: 4.0000 mg | ORAL_TABLET | Freq: Four times a day (QID) | ORAL | Status: DC | PRN

## 2011-11-24 MED ORDER — ONDANSETRON HCL 4 MG/2ML IJ SOLN
INTRAMUSCULAR | Status: DC | PRN
  Administered 2011-11-24: 4 mg via INTRAVENOUS

## 2011-11-24 MED ORDER — GLYCOPYRROLATE 0.2 MG/ML IJ SOLN
INTRAMUSCULAR | Status: DC | PRN
  Administered 2011-11-24: .4 mg via INTRAVENOUS

## 2011-11-24 MED ORDER — 0.9 % SODIUM CHLORIDE (POUR BTL) OPTIME
TOPICAL | Status: DC | PRN
  Administered 2011-11-24: 1000 mL

## 2011-11-24 MED ORDER — HYDROCODONE-ACETAMINOPHEN 5-325 MG PO TABS
1.0000 | ORAL_TABLET | ORAL | Status: DC | PRN
  Administered 2011-11-25 (×3): 1 via ORAL
  Filled 2011-11-24 (×2): qty 1
  Filled 2011-11-24: qty 2

## 2011-11-24 MED ORDER — ACETAMINOPHEN 325 MG PO TABS: 650.0000 mg | ORAL_TABLET | ORAL | Status: DC | PRN

## 2011-11-24 MED ORDER — HYDROMORPHONE HCL PF 1 MG/ML IJ SOLN
INTRAMUSCULAR | Status: AC
  Filled 2011-11-24: qty 1

## 2011-11-24 MED ORDER — SUFENTANIL CITRATE 50 MCG/ML IV SOLN
INTRAVENOUS | Status: DC | PRN
  Administered 2011-11-24: 10 ug via INTRAVENOUS
  Administered 2011-11-24: 20 ug via INTRAVENOUS

## 2011-11-24 MED ORDER — LACTATED RINGERS IV SOLN: INTRAVENOUS | Status: DC

## 2011-11-24 MED ORDER — LACTATED RINGERS IV SOLN
INTRAVENOUS | Status: DC | PRN
  Administered 2011-11-24 (×2): via INTRAVENOUS

## 2011-11-24 MED ORDER — HYDROMORPHONE HCL PF 1 MG/ML IJ SOLN
1.0000 mg | INTRAMUSCULAR | Status: DC | PRN
  Administered 2011-11-24 – 2011-11-25 (×5): 1 mg via INTRAVENOUS
  Filled 2011-11-24 (×5): qty 1

## 2011-11-24 MED ORDER — LIDOCAINE HCL 4 % MT SOLN
OROMUCOSAL | Status: DC | PRN
  Administered 2011-11-24: 4 mL via TOPICAL

## 2011-11-24 MED ORDER — PROPOFOL 10 MG/ML IV BOLUS
INTRAVENOUS | Status: DC | PRN
  Administered 2011-11-24: 160 mg via INTRAVENOUS

## 2011-11-24 MED ORDER — CIPROFLOXACIN IN D5W 400 MG/200ML IV SOLN
INTRAVENOUS | Status: DC | PRN
  Administered 2011-11-24: 400 mg via INTRAVENOUS

## 2011-11-24 MED ORDER — NEOSTIGMINE METHYLSULFATE 1 MG/ML IJ SOLN
INTRAMUSCULAR | Status: DC | PRN
  Administered 2011-11-24: 3 mg via INTRAVENOUS

## 2011-11-24 MED ORDER — MIDAZOLAM HCL 5 MG/5ML IJ SOLN
INTRAMUSCULAR | Status: DC | PRN
  Administered 2011-11-24 (×2): 1 mg via INTRAVENOUS

## 2011-11-24 MED ORDER — SUCCINYLCHOLINE CHLORIDE 20 MG/ML IJ SOLN
INTRAMUSCULAR | Status: DC | PRN
  Administered 2011-11-24: 80 mg via INTRAVENOUS

## 2011-11-24 MED ORDER — ONDANSETRON HCL 4 MG/2ML IJ SOLN: 4.0000 mg | Freq: Four times a day (QID) | INTRAMUSCULAR | Status: DC | PRN

## 2011-11-24 MED ORDER — PROMETHAZINE HCL 25 MG/ML IJ SOLN: 6.2500 mg | INTRAMUSCULAR | Status: DC | PRN

## 2011-11-24 MED ORDER — HYDROMORPHONE HCL PF 1 MG/ML IJ SOLN
0.2500 mg | INTRAMUSCULAR | Status: DC | PRN
  Administered 2011-11-24 (×2): 0.5 mg via INTRAVENOUS

## 2011-11-24 MED ORDER — KCL IN DEXTROSE-NACL 20-5-0.45 MEQ/L-%-% IV SOLN
INTRAVENOUS | Status: DC
  Administered 2011-11-24: 13:00:00 via INTRAVENOUS
  Administered 2011-11-25: 75 mL/h via INTRAVENOUS
  Filled 2011-11-24 (×3): qty 1000

## 2011-11-24 MED ORDER — DEXAMETHASONE SODIUM PHOSPHATE 4 MG/ML IJ SOLN
INTRAMUSCULAR | Status: DC | PRN
  Administered 2011-11-24: 10 mg via INTRAVENOUS

## 2011-11-24 SURGICAL SUPPLY — 40 items
APL SKNCLS STERI-STRIP NONHPOA (GAUZE/BANDAGES/DRESSINGS) ×1
ATTRACTOMAT 16X20 MAGNETIC DRP (DRAPES) ×2 IMPLANT
BENZOIN TINCTURE PRP APPL 2/3 (GAUZE/BANDAGES/DRESSINGS) ×2 IMPLANT
BLADE HEX COATED 2.75 (ELECTRODE) ×2 IMPLANT
BLADE SURG 15 STRL LF DISP TIS (BLADE) ×1 IMPLANT
BLADE SURG 15 STRL SS (BLADE) ×2
CANISTER SUCTION 2500CC (MISCELLANEOUS) ×2 IMPLANT
CHLORAPREP W/TINT 10.5 ML (MISCELLANEOUS) ×2 IMPLANT
CLIP TI MEDIUM 6 (CLIP) ×4 IMPLANT
CLIP TI WIDE RED SMALL 6 (CLIP) ×4 IMPLANT
CLOTH BEACON ORANGE TIMEOUT ST (SAFETY) ×2 IMPLANT
CLSR STERI-STRIP ANTIMIC 1/2X4 (GAUZE/BANDAGES/DRESSINGS) ×1 IMPLANT
DISSECTOR ROUND CHERRY 3/8 STR (MISCELLANEOUS) IMPLANT
DRAPE PED LAPAROTOMY (DRAPES) ×2 IMPLANT
DRESSING SURGICEL FIBRLLR 1X2 (HEMOSTASIS) ×1 IMPLANT
DRSG SURGICEL FIBRILLAR 1X2 (HEMOSTASIS) ×2
ELECT REM PT RETURN 9FT ADLT (ELECTROSURGICAL) ×2
ELECTRODE REM PT RTRN 9FT ADLT (ELECTROSURGICAL) ×1 IMPLANT
GAUZE SPONGE 4X4 16PLY XRAY LF (GAUZE/BANDAGES/DRESSINGS) ×2 IMPLANT
GLOVE SURG ORTHO 8.0 STRL STRW (GLOVE) ×2 IMPLANT
GOWN STRL NON-REIN LRG LVL3 (GOWN DISPOSABLE) ×2 IMPLANT
GOWN STRL REIN XL XLG (GOWN DISPOSABLE) ×4 IMPLANT
KIT BASIN OR (CUSTOM PROCEDURE TRAY) ×2 IMPLANT
NS IRRIG 1000ML POUR BTL (IV SOLUTION) ×2 IMPLANT
PACK BASIC VI WITH GOWN DISP (CUSTOM PROCEDURE TRAY) ×2 IMPLANT
PENCIL BUTTON HOLSTER BLD 10FT (ELECTRODE) ×2 IMPLANT
SHEARS HARMONIC 9CM CVD (BLADE) ×2 IMPLANT
SPONGE GAUZE 4X4 12PLY (GAUZE/BANDAGES/DRESSINGS) ×1 IMPLANT
STAPLER VISISTAT 35W (STAPLE) ×2 IMPLANT
STRIP CLOSURE SKIN 1/2X4 (GAUZE/BANDAGES/DRESSINGS) ×2 IMPLANT
SUT MNCRL AB 4-0 PS2 18 (SUTURE) ×2 IMPLANT
SUT SILK 2 0 (SUTURE) ×2
SUT SILK 2-0 18XBRD TIE 12 (SUTURE) ×1 IMPLANT
SUT SILK 3 0 (SUTURE)
SUT SILK 3-0 18XBRD TIE 12 (SUTURE) IMPLANT
SUT VIC AB 3-0 SH 18 (SUTURE) ×3 IMPLANT
SYR BULB IRRIGATION 50ML (SYRINGE) ×2 IMPLANT
TAPE CLOTH SURG 4X10 WHT LF (GAUZE/BANDAGES/DRESSINGS) ×1 IMPLANT
TOWEL OR 17X26 10 PK STRL BLUE (TOWEL DISPOSABLE) ×2 IMPLANT
YANKAUER SUCT BULB TIP 10FT TU (MISCELLANEOUS) ×2 IMPLANT

## 2011-11-24 NOTE — Brief Op Note (Signed)
11/24/2011  8:42 AM  PATIENT:  Samantha Reynolds  46 y.o. female  PRE-OPERATIVE DIAGNOSIS:  thyroid nodule with atypia  POST-OPERATIVE DIAGNOSIS:  thyroid nodule with atypia  PROCEDURE:  Procedure(s) (LRB): THYROID LOBECTOMY (Left)  SURGEON:  Surgeon(s) and Role:    * Velora Heckler, MD - Primary    * Adolph Pollack, MD - Assisting  ANESTHESIA:   general  EBL:  Total I/O In: 1000 [I.V.:1000] Out: -   BLOOD ADMINISTERED:none  DRAINS: none   LOCAL MEDICATIONS USED:  NONE  SPECIMEN:  Excision  DISPOSITION OF SPECIMEN:  PATHOLOGY  COUNTS:  YES  TOURNIQUET:  * No tourniquets in log *  DICTATION: .Other Dictation: Dictation Number [id number]  PLAN OF CARE: Admit for overnight observation  PATIENT DISPOSITION:  PACU - hemodynamically stable.   Delay start of Pharmacological VTE agent (>24hrs) due to surgical blood loss or risk of bleeding: yes  Velora Heckler, MD, FACS General & Endocrine Surgery [facility] Office: [phone]

## 2011-11-24 NOTE — Transfer of Care (Signed)
Immediate Anesthesia Transfer of Care Note  Patient: Samantha Reynolds  Procedure(s) Performed: Procedure(s) (LRB): THYROID LOBECTOMY (Left)  Patient Location: PACU  Anesthesia Type: General  Level of Consciousness: awake, alert  and oriented  Airway & Oxygen Therapy: Patient Spontanous Breathing and Patient connected to face mask oxygen  Post-op Assessment: Report given to PACU RN and Post -op Vital signs reviewed and stable  Post vital signs: Reviewed and stable  Complications: No apparent anesthesia complications

## 2011-11-24 NOTE — Anesthesia Postprocedure Evaluation (Signed)
  Anesthesia Post-op Note  Patient: Samantha Reynolds  Procedure(s) Performed: Procedure(s) (LRB): THYROID LOBECTOMY (Left)  Patient Location: PACU  Anesthesia Type: General  Level of Consciousness: awake and alert   Airway and Oxygen Therapy: Patient Spontanous Breathing  Post-op Pain: mild  Post-op Assessment: Post-op Vital signs reviewed, Patient's Cardiovascular Status Stable, Respiratory Function Stable, Patent Airway and No signs of Nausea or vomiting  Post-op Vital Signs: stable  Complications: No apparent anesthesia complications

## 2011-11-24 NOTE — H&P (View-Only) (Signed)
General Surgery [facility]  Chief Complaint  Patient presents with  . New Evaluation    thyroid nodule with atypia left lobe - referral from Dr. Sharin Grave, [facility]    HISTORY: The patient is a 46 year old white female referred by her endocrinologist for left thyroid lobectomy for diagnosis of a left thyroid nodule with cytologic atypia. 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 She recently started on Armour Thyroid in May of 2013. She has had no prior surgery on the head or neck. Patient underwent ultrasound in May 2013. This showed a 9 mm nodule in the left thyroid lobe which was a new finding. Patient underwent fine-needle aspiration which showed a follicular lesion with cytologic atypia. Further testing raised a suspicion of malignancy. Left thyroid lobectomy was recommended for definitive diagnosis. Ultrasound does show a 3 mm nodule in the right lobe and a poorly defined hyperechoic area in the central portion of the right lobe.  There is a family history of thyroidectomy in a paternal grandmother for unknown cause. There is no family history of thyroid malignancy. There is no family history of other endocrinopathy.  Past Medical History  Diagnosis Date  . Thyroid disease      Current Outpatient Prescriptions  Medication Sig Dispense Refill  . piroxicam (FELDENE) 20 MG capsule Take 20 mg by mouth daily.      Marland Kitchen thyroid (ARMOUR) 60 MG tablet Take 60 mg by mouth daily.         Allergies  Allergen Reactions  . Demerol (Meperidine) Itching    All over the body.  . Penicillins Hives    Happened in childhood, does not remember if it spread all over the body or not.     Family History  Problem Relation Age of Onset  . Cancer Maternal Aunt     spine/back  . Cancer Paternal Grandfather     lung     History   Social History  . Marital Status: Married    Spouse Name: N/A    Number of  Children: N/A  . Years of Education: N/A   Social History Main Topics  . Smoking status: Former Smoker    Quit date: 10/31/1991  . Smokeless tobacco: Never Used  . Alcohol Use: No  . Drug Use: No  . Sexually Active: None   Other Topics Concern  . None   Social History Narrative  . None     REVIEW OF SYSTEMS - PERTINENT POSITIVES ONLY: Denies tremor. Denies palpitation. Denies compressive symptoms.  EXAM: Filed Vitals:   10/31/11 1336  BP: 142/82  Pulse: 72  Temp: 97.3 F (36.3 C)  Resp: 16    HEENT: normocephalic; pupils equal and reactive; sclerae clear; dentition good; mucous membranes moist NECK:  Firm thyroid gland with right lobe slightly larger than left, no discrete palpable nodules; symmetric on extension; no palpable anterior or posterior cervical lymphadenopathy; no supraclavicular masses; no tenderness CHEST: clear to auscultation bilaterally without rales, rhonchi, or wheezes CARDIAC: regular rate and rhythm without significant murmur; peripheral pulses are full EXT:  non-tender without edema; no deformity NEURO: no gross focal deficits; no sign of tremor   LABORATORY RESULTS: See [facility] (CHL-Epic) for most recent results   RADIOLOGY RESULTS: See [facility] (CHL-Epic) for most recent results   IMPRESSION: #1 left thyroid nodule, 9 mm, with cytologic atypia on fine needle aspiration biopsy #2 history of thyroiditis  PLAN: The patient  and I and her husband discussed all of the above findings at length. I agree with left thyroid lobectomy for definitive diagnosis based on the cytopathology findings. I have offered total thyroidectomy given that there are nodules in the right lobe and that the patient is already taking thyroid hormone supplementation. We discussed the pros and cons of lobectomy versus total thyroidectomy. Patient would like to proceed with left thyroid lobectomy, and consider proceeding with right thyroid lobectomy only in  the event of malignancy or other significant abnormality.  We discussed the procedure, the hospital stay, and the postoperative recovery. We discussed the likely need for lifelong thyroid hormone replacement therapy. We discussed potential risk including recurrent nerve injury and injury to parathyroid glands. They understand and wish to proceed with surgery.  The risks and benefits of the procedure have been discussed at length with the patient.  The patient understands the proposed procedure, potential alternative treatments, and the course of recovery to be expected.  All of the patient's questions have been answered at this time.  The patient wishes to proceed with surgery.  Velora Heckler, MD, FACS General & Endocrine Surgery [facility]   Visit Diagnoses: 1. Neoplasm of uncertain behavior of thyroid gland, left lobe     Primary Care Physician: Lilyan Punt, MD

## 2011-11-24 NOTE — Interval H&P Note (Signed)
History and Physical Interval Note:  11/24/2011 7:25 AM  Samantha Reynolds  has presented today for surgery, with the diagnosis of thyroid nodule with atypia.  The various methods of treatment have been discussed with the patient and family. After consideration of risks, benefits and other options for treatment, the patient has consented to    Procedure(s) (LRB): THYROID LOBECTOMY (Left) as a surgical intervention.    The patient's history has been reviewed, patient examined, no change in status, stable for surgery.  I have reviewed the patient's chart and labs.  Questions were answered to the patient's satisfaction.    Velora Heckler, MD, [facility] Office: [phone]   Gevorg Brum Judie Petit

## 2011-11-24 NOTE — Anesthesia Procedure Notes (Signed)
Procedure Name: Intubation Date/Time: 11/24/2011 7:40 AM Performed by: Leroy Libman L Patient Re-evaluated:Patient Re-evaluated prior to inductionOxygen Delivery Method: Circle system utilized Preoxygenation: Pre-oxygenation with 100% oxygen Intubation Type: IV induction Ventilation: Mask ventilation without difficulty and Oral airway inserted - appropriate to patient size Laryngoscope Size: Hyacinth Meeker and 2 Grade View: Grade I Tube type: Reinforced Tube size: 7.5 mm Number of attempts: 1 Airway Equipment and Method: Stylet and LTA kit utilized Placement Confirmation: ETT inserted through vocal cords under direct vision,  breath sounds checked- equal and bilateral and positive ETCO2 Secured at: 22 cm Tube secured with: Tape Dental Injury: Teeth and Oropharynx as per pre-operative assessment

## 2011-11-24 NOTE — Anesthesia Preprocedure Evaluation (Signed)
Anesthesia Evaluation  Patient identified by MRN, date of birth, ID band Patient awake    Reviewed: Allergy & Precautions, H&P , NPO status , Patient's Chart, lab work & pertinent test results  Airway Mallampati: II TM Distance: >3 FB Neck ROM: Full    Dental No notable dental hx.    Pulmonary neg pulmonary ROS,  breath sounds clear to auscultation  Pulmonary exam normal       Cardiovascular negative cardio ROS  Rhythm:Regular Rate:Normal     Neuro/Psych negative neurological ROS  negative psych ROS   GI/Hepatic negative GI ROS, Neg liver ROS,   Endo/Other  negative endocrine ROS  Renal/GU negative Renal ROS  negative genitourinary   Musculoskeletal negative musculoskeletal ROS (+)   Abdominal   Peds negative pediatric ROS (+)  Hematology negative hematology ROS (+)   Anesthesia Other Findings   Reproductive/Obstetrics negative OB ROS                           Anesthesia Physical Anesthesia Plan  ASA: II  Anesthesia Plan: General   Post-op Pain Management:    Induction: Intravenous  Airway Management Planned: Oral ETT  Additional Equipment:   Intra-op Plan:   Post-operative Plan: Extubation in OR  Informed Consent: I have reviewed the patients History and Physical, chart, labs and discussed the procedure including the risks, benefits and alternatives for the proposed anesthesia with the patient or authorized representative who has indicated his/her understanding and acceptance.   Dental advisory given  Plan Discussed with: CRNA  Anesthesia Plan Comments:         Anesthesia Quick Evaluation  

## 2011-11-25 ENCOUNTER — Encounter (HOSPITAL_COMMUNITY): Payer: Self-pay | Admitting: Surgery

## 2011-11-25 ENCOUNTER — Telehealth (INDEPENDENT_AMBULATORY_CARE_PROVIDER_SITE_OTHER): Payer: Self-pay

## 2011-11-25 MED ORDER — HYDROCODONE-ACETAMINOPHEN 5-325 MG PO TABS: 1.0000 | ORAL_TABLET | ORAL | Status: AC | PRN

## 2011-11-25 NOTE — Care Management Note (Signed)
    Page 1 of 1   11/25/2011     1:25:06 PM   CARE MANAGEMENT NOTE 11/25/2011  Patient:  Samantha Reynolds, Samantha Reynolds   Account Number:  [id number]  Date Initiated:  11/25/2011  Documentation initiated by:  Lorenda Ishihara  Subjective/Objective Assessment:   46 yo female admitted s/p thyroid lobectomy. PTA lived at home with spouse.     Action/Plan:   Anticipated DC Date:  11/25/2011   Anticipated DC Plan:  HOME/SELF CARE      DC Planning Services  CM consult      Choice offered to / List presented to:             Status of service:  Completed, signed off Medicare Important Message given?   (If response is "NO", the following Medicare IM given date fields will be blank) Date Medicare IM given:   Date Additional Medicare IM given:    Discharge Disposition:  HOME/SELF CARE  Per UR Regulation:  Reviewed for med. necessity/level of care/duration of stay  If discussed at Long Length of Stay Meetings, dates discussed:    Comments:

## 2011-11-25 NOTE — Telephone Encounter (Signed)
LMOM for pt to call and confirm po appt. Pt also needs to make f/u appt with [name] for 4 wk f/u.

## 2011-11-25 NOTE — Discharge Summary (Signed)
Physician Discharge Summary [facility]  Patient ID: Samantha Reynolds MRN: [id number] DOB/AGE: 07-29-1965 46 y.o.  Admit date: 11/24/2011 Discharge date: 11/25/2011  Admission Diagnoses:  Thyroid nodule with atypia  Discharge Diagnoses:  Principal Problem:  *Neoplasm of uncertain behavior of thyroid gland, left lobe   Discharged Condition: good  Hospital Course: patient admitted for observation after left thyroid lobectomy.  Post op course stable without any sign of bleeding or edema.  Tolerated diet.  Pain well controlled.  Prepared for discharge on POD#1.  Consults: None  Significant Diagnostic Studies: none  Treatments: surgery: left thyroid lobectomy 11/24/2011  Discharge Exam: Blood pressure 101/70, pulse 77, temperature 97.8 F (36.6 C), temperature source Oral, resp. rate 16, height 5\' 7"  (1.702 m), weight 185 lb 12.8 oz (84.278 kg), last menstrual period 11/22/2011, SpO2 99.00%. HEENT - clear Neck - incision clear and dry, minimal STS, voice normal Chest - clear bilat  Disposition: Home with family  Discharge Orders    Future Appointments: Provider: Department: Dept Phone: Center:   12/12/2011 10:15 AM Velora Heckler, MD [facility] [phone] None     Future Orders Please Complete By Expires   Diet - low sodium heart healthy      Increase activity slowly      Discharge instructions      Comments:   [facility], [place] [phone]  THYROID & PARATHYROID SURGERY -- POST OP INSTRUCTIONS  Always review your discharge instruction sheet from the facility where your surgery was performed.  A prescription for pain medication may be given to you upon discharge.  Take your pain medication as prescribed.  If narcotic pain medicine is not needed, then you may take acetaminophen (Tylenol) or ibuprofen (Advil) as needed. Take your usually prescribed medications unless otherwise directed. If you need a refill on your pain medication, please  contact your pharmacy. They will contact our office to request authorization.  Prescriptions will not be processed after 5 pm or on weekends. Start with a light diet upon arrival home, such as soup and crackers or toast.  Be sure to drink plenty of fluids daily.  Resume your normal diet the day after surgery. Most patients will experience some swelling and bruising on the chest and neck area.  Ice packs will help.  Swelling and bruising can take several days to resolve.  It is common to experience some constipation if taking pain medication after surgery.  Increasing fluid intake and taking a stool softener will usually help or prevent this problem.  A mild laxative (Milk of Magnesia or Miralax) should be taken according to package directions if there are no bowel movements after 48 hours. You may remove your bandages 24-48 hours after surgery, and you may shower at that time.  You have steri-strips (small skin tapes) in place directly over the incision.  These strips should be left on the skin for 7-10 days and then removed. You may resume regular (light) daily activities beginning the next day-such as daily self-care, walking, climbing stairs-gradually increasing activities as tolerated.  You may have sexual intercourse when it is comfortable.  Refrain from any heavy lifting or straining until approved by your doctor.  You may drive when you no longer are taking prescription pain medication, you can comfortably wear a seatbelt, and you can safely maneuver your car and apply brakes. You should see your doctor in the office for a follow-up appointment approximately two to three weeks after your surgery.  Make sure  that you call for this appointment within a day or two after you arrive home to insure a convenient appointment time.  WHEN TO CALL YOUR DOCTOR: Fever greater than 101.5 Inability to urinate Nausea and/or vomiting - persistent Extreme swelling or bruising Continued bleeding from  incision Increased pain, redness, or drainage from the incision Difficulty swallowing or breathing Muscle cramping or spasms Numbness or tingling in hands or around lips  The clinic staff is available to answer your questions during regular business hours.  Please don't hesitate to call and ask to speak to one of the nurses if you have concerns. [facility], [place] [phone]  [email or web address]   Remove dressing in 24 hours        Medication List  As of 11/25/2011  9:56 AM   TAKE these medications         HYDROcodone-acetaminophen 5-325 MG per tablet   Commonly known as: NORCO/VICODIN   Take 1-2 tablets by mouth every 4 (four) hours as needed for pain.      ibuprofen 200 MG tablet   Commonly known as: ADVIL,MOTRIN   Take 400 mg by mouth every 6 (six) hours as needed.      thyroid 60 MG tablet   Commonly known as: ARMOUR   Take 60 mg by mouth every morning.      vitamin B-12 1000 MCG tablet   Commonly known as: CYANOCOBALAMIN   Take 2,000 mcg by mouth daily.           Follow-up Information    Follow up with Velora Heckler, MD. Schedule an appointment as soon as possible for a visit in 3 weeks.   Contact information:   [address] [place] [address] [phone]         Velora Heckler, MD, FACS General & Endocrine Surgery [facility] Office: [phone]  Signed: Velora Heckler 11/25/2011, 9:56 AM

## 2011-11-25 NOTE — Op Note (Signed)
Samantha Reynolds, CHRISTENSON NO.:  [id number]  MEDICAL RECORD NO.:  [id number]  LOCATION:  1535                         FACILITY:  [facility]  PHYSICIAN:  Velora Heckler, MD      DATE OF BIRTH:  Dec 03, 1965  DATE OF PROCEDURE:  11/24/2011                               OPERATIVE REPORT   PREOPERATIVE DIAGNOSIS:  Left thyroid nodule.  POSTOPERATIVE DIAGNOSIS:  Left thyroid nodule.  PROCEDURE:  Left thyroid lobectomy.  SURGEON:  Velora Heckler, MD, FACS  ASSISTANT:  Adolph Pollack, MD, FACS  ANESTHESIA:  General per Dr. Phillips Grout.  ESTIMATED BLOOD LOSS:  Minimal.  PREPARATION:  ChloraPrep.  COMPLICATIONS:  None.  INDICATIONS:  The patient is a 46 year old white female, referred by her endocrinologist for left thyroid lobectomy for diagnosis of a left thyroid nodule with cytologic atypia on fine-needle aspiration biopsy. The patient has been followed for approximately 5 years with underlying thyroiditis.  Ultrasound demonstrated a 9-mm nodule in the left thyroid lobe which was a new finding.  Fine-needle aspiration biopsy showed a follicular lesion with cytologic atypia, raising a suspicion of malignancy.  The patient now comes to surgery for excision for definitive diagnosis.  BODY OF REPORT:  Procedure was done in OR #1 at the [facility].  The patient was brought to the operating room and placed in a supine position on the operating room table.  Following administration of general anesthesia, the patient was positioned and then prepped and draped in usual strict aseptic fashion.  After ascertaining that an adequate level of anesthesia been achieved, a Kocher incision was made with a #15 blade.  Dissection was carried through subcutaneous tissues and platysma and hemostasis obtained with electrocautery.  Skin flaps were elevated cephalad and caudad from the thyroid notch to the sternal notch.  A Mahorner self-retaining retractor was  placed for exposure.  Strap muscles were incised in the midline. The dissection was begun on the left side.  Left thyroid lobe was slightly firm and moderately thickened.  There were no dominant nodules palpable.  Strap muscles were reflected laterally and the lobe was gently mobilized with blunt dissection.  Venous tributaries were divided between small and medium Ligaclips with the Harmonic Scalpel.  Superior pole was dissected out, and superior pole vessels divided between medium Ligaclips with the Harmonic Scalpel.  Gland was rolled anteriorly, and the recurrent laryngeal nerve was identified.  Branches of the inferior thyroid artery were divided between small Ligaclips with the Harmonic Scalpel.  Inferior venous tributaries were divided between small Ligaclips with the Harmonic Scalpel.  Ligament of Allyson Sabal was released with the electrocautery and the gland was rolled onto the anterior trachea.  Isthmus was mobilized across the midline.  A moderate pyramidal lobe was also dissected out and resected with the isthmus. Isthmus was transected at its junction with the right thyroid lobe using the Harmonic Scalpel.  Specimen was submitted in its entirety to pathology for review.  Right thyroid lobe appeared grossly normal.  It was palpated.  There were no dominant nor discrete masses.  Remainder of the neck was without palpable adenopathy.  The neck was irrigated with warm saline.  Surgicel was placed throughout the operative field.  Strap muscles were reapproximated in the midline with interrupted 3-0 Vicryl sutures.  Platysma was closed with interrupted 3-0 Vicryl sutures.  Skin was closed with a running 4-0 Monocryl subcuticular suture.  Wound was washed and dried and benzoin and Steri-Strips were applied.  Sterile dressings were applied.  The patient was awakened from anesthesia and brought to the operating room in stable condition.  The patient tolerated the procedure well.  Velora Heckler, MD, FACS General & Endocrine Surgery [facility] Office: [phone]   TMG/MEDQ  D:  11/24/2011  T:  11/25/2011  Job:  [id number]  cc:   Alan Mulder, M.D. Fax: [phone]

## 2011-11-29 ENCOUNTER — Telehealth (INDEPENDENT_AMBULATORY_CARE_PROVIDER_SITE_OTHER): Payer: Self-pay

## 2011-11-29 NOTE — Telephone Encounter (Signed)
Per Dr Ardine Eng request appt made with Dr Donalda Ewings for 9-19 and path reports faxed to his office.

## 2011-11-29 NOTE — Telephone Encounter (Signed)
Pt returned call and advised of appt with Dr Gerrit Friends 9-16. Pt states she will make her appt with [name] for 4 weeks per Dr Ardine Eng request.

## 2011-12-01 ENCOUNTER — Telehealth (INDEPENDENT_AMBULATORY_CARE_PROVIDER_SITE_OTHER): Payer: Self-pay

## 2011-12-01 NOTE — Telephone Encounter (Addendum)
[facility] @ Dr. Williemae Natter office called regarding Ms. Lengel's post operative follow up (s/p left lobectomy) care.  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  Dr. Gerrit Friends would like Dr. Williemae Natter (referring Physician) opinion for the following:          1.  Treat & manage thyroid papillary carcinoma  2.  Observe/Surveillance Thyroid Carcinoma  3.  Total Thyroidectomy.   We will forward a copy of this telephone encounter to Dr. Kerrie Pleasure and ask to please give Dr. Gerrit Friends a call to discuss in further detail.

## 2011-12-12 ENCOUNTER — Encounter (INDEPENDENT_AMBULATORY_CARE_PROVIDER_SITE_OTHER): Payer: Self-pay | Admitting: Surgery

## 2011-12-12 ENCOUNTER — Ambulatory Visit (INDEPENDENT_AMBULATORY_CARE_PROVIDER_SITE_OTHER): Payer: Commercial Managed Care - PPO | Admitting: Surgery

## 2011-12-12 VITALS — BP 114/80 | HR 78 | Temp 98.4°F | Resp 16 | Ht 67.0 in | Wt 179.0 lb

## 2011-12-12 DIAGNOSIS — D440 Neoplasm of uncertain behavior of thyroid gland: Secondary | ICD-10-CM

## 2011-12-12 DIAGNOSIS — D449 Neoplasm of uncertain behavior of unspecified endocrine gland: Secondary | ICD-10-CM

## 2011-12-12 DIAGNOSIS — C73 Malignant neoplasm of thyroid gland: Secondary | ICD-10-CM

## 2011-12-12 NOTE — Patient Instructions (Signed)
  COCOA BUTTER & VITAMIN E CREAM  (Palmer's or other brand)  Apply cocoa butter/vitamin E cream to your incision 2 - 3 times daily.  Massage cream into incision for one minute with each application.  Use sunscreen (50 SPF or higher) for first 6 months after surgery if area is exposed to sun.  You may substitute Mederma or other scar reducing creams as desired.   

## 2011-12-12 NOTE — Progress Notes (Signed)
General Surgery [facility]  Visit Diagnoses: 1. Thyroid cancer, left lobe, 6mm   2. Neoplasm of uncertain behavior of thyroid gland, left lobe     HISTORY: Patient returns for her first postoperative visit having undergone left thyroid lobectomy. Final pathology showed extensive lymphocytic thyroiditis. The solitary nodule in the left lobe measured 6 mm in size and final pathology showed a follicular variant of papillary thyroid carcinoma. There were no worrisome prognostic findings. 2 lymph nodes were negative for metastatic disease.  Patient has been seen by her endocrinologist. He recommends careful observation but no further surgical intervention at this point. She remains on Armour thyroid 60 mg daily.  EXAM: Surgical incision is well-healed. No sign of seroma. No sign of infection. Voice quality is normal.  IMPRESSION: Follicular variant of papillary thyroid carcinoma, 6 mm, left lobe  PLAN: Patient will continue with close followup both with her endocrinologist and here at our office. She will return to work tomorrow. We will see her back in 3 months for physical examination.  Velora Heckler, MD, FACS General & Endocrine Surgery [facility]

## 2012-01-02 ENCOUNTER — Other Ambulatory Visit (HOSPITAL_COMMUNITY): Payer: Self-pay | Admitting: Obstetrics and Gynecology

## 2012-01-02 DIAGNOSIS — IMO0001 Reserved for inherently not codable concepts without codable children: Secondary | ICD-10-CM

## 2012-02-17 ENCOUNTER — Ambulatory Visit (HOSPITAL_COMMUNITY)
Admission: RE | Admit: 2012-02-17 | Discharge: 2012-02-17 | Disposition: A | Discharge status: Home / Self Care | Payer: 59 | Source: Ambulatory Visit | Attending: Obstetrics and Gynecology | Admitting: Obstetrics and Gynecology

## 2012-02-17 ENCOUNTER — Other Ambulatory Visit: Payer: Self-pay | Admitting: Obstetrics and Gynecology

## 2012-02-17 DIAGNOSIS — IMO0001 Reserved for inherently not codable concepts without codable children: Secondary | ICD-10-CM

## 2012-02-17 DIAGNOSIS — Z1231 Encounter for screening mammogram for malignant neoplasm of breast: Secondary | ICD-10-CM | POA: Insufficient documentation

## 2012-04-02 ENCOUNTER — Ambulatory Visit (INDEPENDENT_AMBULATORY_CARE_PROVIDER_SITE_OTHER): Payer: Commercial Managed Care - PPO | Admitting: Surgery

## 2012-04-02 ENCOUNTER — Encounter (INDEPENDENT_AMBULATORY_CARE_PROVIDER_SITE_OTHER): Payer: Self-pay | Admitting: Surgery

## 2012-04-02 VITALS — BP 122/78 | HR 88 | Temp 96.6°F | Resp 18 | Ht 67.0 in | Wt 183.4 lb

## 2012-04-02 DIAGNOSIS — C73 Malignant neoplasm of thyroid gland: Secondary | ICD-10-CM

## 2012-04-02 DIAGNOSIS — Z85850 Personal history of malignant neoplasm of thyroid: Secondary | ICD-10-CM

## 2012-04-02 NOTE — Progress Notes (Signed)
General Surgery [facility]  Visit Diagnoses: 1. Thyroid cancer, left lobe, 6mm   2. History of thyroid cancer     HISTORY: Patient is a 47 year old white female who underwent thyroid lobectomy with an incidental finding of a 6 mm follicular variant of papillary thyroid carcinoma in the left thyroid lobe. Surgical margins were negative. This was felt to be a low risk lesion. Completion thyroidectomy and radioactive iodine treatment were not performed.  Patient continues close follow-up with her endocrinologist. She had an ultrasound in December which showed no sign of recurrent disease. Her dosage of Armour thyroid was increased to 90 mg daily.  PERTINENT REVIEW OF SYSTEMS: Patient feels well. Denies fatigue. Denies palpitations. Denies tremors. Denies difficulty sleeping.  EXAM: HEENT: normocephalic; pupils equal and reactive; sclerae clear; dentition good; mucous membranes moist NECK:  Surgical incision well healed; symmetric on extension; no palpable anterior or posterior cervical lymphadenopathy; no supraclavicular masses; no tenderness CHEST: clear to auscultation bilaterally without rales, rhonchi, or wheezes CARDIAC: regular rate and rhythm without significant murmur; peripheral pulses are full EXT:  non-tender without edema; no deformity NEURO: no gross focal deficits; no sign of tremor   IMPRESSION: Follicular variant of papillary thyroid carcinoma, 6 mm, status post lobectomy, no sign of recurrent disease  PLAN: Patient will continue close follow-up with Dr. Sharin Grave. Patient will see me in August 2014 at the one-year anniversary of her surgery. Her endocrinologist plans a followup ultrasound prior to that date.  Velora Heckler, MD, [facility] Office: [phone]

## 2012-04-02 NOTE — Patient Instructions (Addendum)
  COCOA BUTTER & VITAMIN E CREAM  (Palmer's or other brand)  Apply cocoa butter/vitamin E cream to your incision 2 - 3 times daily.  Massage cream into incision for one minute with each application.  Use sunscreen (50 SPF or higher) for first 6 months after surgery if area is exposed to sun.  You may substitute Mederma or other scar reducing creams as desired.   

## 2012-06-21 ENCOUNTER — Encounter: Payer: Self-pay | Admitting: *Deleted

## 2012-06-26 ENCOUNTER — Ambulatory Visit (INDEPENDENT_AMBULATORY_CARE_PROVIDER_SITE_OTHER): Payer: 59 | Admitting: Family Medicine

## 2012-06-26 ENCOUNTER — Encounter: Payer: Self-pay | Admitting: Family Medicine

## 2012-06-26 VITALS — BP 128/80 | HR 70 | Ht 67.0 in | Wt 189.6 lb

## 2012-06-26 DIAGNOSIS — M5431 Sciatica, right side: Secondary | ICD-10-CM | POA: Insufficient documentation

## 2012-06-26 DIAGNOSIS — M5430 Sciatica, unspecified side: Secondary | ICD-10-CM

## 2012-06-26 MED ORDER — PREDNISONE 20 MG PO TABS: ORAL_TABLET | ORAL | Status: DC

## 2012-06-26 MED ORDER — DICLOFENAC SODIUM 75 MG PO TBEC: 75.0000 mg | DELAYED_RELEASE_TABLET | Freq: Two times a day (BID) | ORAL | Status: AC

## 2012-06-26 NOTE — Progress Notes (Signed)
  Subjective:    Patient ID: Samantha Reynolds, female    DOB: December 15, 1965, 47 y.o.   MRN: [id number]  Hip Pain  The incident occurred more than 1 week ago. There was no injury mechanism. The pain is present in the right hip. The quality of the pain is described as aching. The pain is at a severity of 3/10. The pain is moderate. The pain has been intermittent since onset. Associated symptoms include numbness and tingling. She reports no foreign bodies present. Nothing aggravates the symptoms. She has tried heat and NSAIDs for the symptoms. The treatment provided mild relief.   This patient relates the pain been present for about a month maybe even up to 6 weeks she describes as right hip pain but she points more to the right but I rates the pain going down the right side of her leg and down the right side all the way to the calf sometimes burning sometimes aching sitting laying for any period of time causes more trouble she also relates occasional numbness on the side of her leg as well. She denies any injury but she does states stiffness with movement. Has never had this problem before. Has tried over-the-counter medications without much help.   Review of Systems  Neurological: Positive for tingling and numbness.   Past medical history family history social history all reviewed    Objective:   Physical Exam  Vital signs stable, lungs are clear heart is regular pulse normal lower back nontender negative straight leg raise reflexes good strength good      Assessment & Plan:  Sciatica of right side  if not doing dramatically better in the next 4 weeks may need MRI. Prednisone taper next 9 days. Then Voltaren twice a day for the next few weeks. Call us if any problems take with food

## 2012-06-26 NOTE — Patient Instructions (Signed)
Prednisone   20 mg tablet. Take 3 tablets daily for 3 days then 2 tablets daily for 3 days then 1 tablet daily for 3 days  After prednisone is finished may use Voltaren 75 mg twice daily with a snack  Do your stretches twice daily  If not significant improvement over the next 4-8 weeks or if significantly worse I would recommend to followup. The next step would be doing MRI.

## 2013-01-17 ENCOUNTER — Other Ambulatory Visit (HOSPITAL_COMMUNITY): Payer: Self-pay | Admitting: Obstetrics and Gynecology

## 2013-01-17 DIAGNOSIS — Z139 Encounter for screening, unspecified: Secondary | ICD-10-CM

## 2013-02-18 ENCOUNTER — Ambulatory Visit (HOSPITAL_COMMUNITY): Payer: 59

## 2013-02-18 ENCOUNTER — Ambulatory Visit (HOSPITAL_COMMUNITY)
Admission: RE | Admit: 2013-02-18 | Discharge: 2013-02-18 | Disposition: A | Discharge status: Home / Self Care | Payer: 59 | Source: Ambulatory Visit | Attending: Obstetrics and Gynecology | Admitting: Obstetrics and Gynecology

## 2013-02-18 DIAGNOSIS — Z1231 Encounter for screening mammogram for malignant neoplasm of breast: Secondary | ICD-10-CM | POA: Insufficient documentation

## 2013-02-18 DIAGNOSIS — Z139 Encounter for screening, unspecified: Secondary | ICD-10-CM

## 2013-12-26 ENCOUNTER — Other Ambulatory Visit: Payer: Self-pay | Admitting: Obstetrics and Gynecology

## 2013-12-27 LAB — CYTOLOGY - PAP

## 2014-02-06 ENCOUNTER — Other Ambulatory Visit (HOSPITAL_COMMUNITY): Payer: Self-pay | Admitting: Obstetrics and Gynecology

## 2014-02-06 DIAGNOSIS — Z1231 Encounter for screening mammogram for malignant neoplasm of breast: Secondary | ICD-10-CM

## 2014-02-19 ENCOUNTER — Ambulatory Visit (HOSPITAL_COMMUNITY)
Admission: RE | Admit: 2014-02-19 | Discharge: 2014-02-19 | Disposition: A | Discharge status: Home / Self Care | Payer: 59 | Source: Ambulatory Visit | Attending: Obstetrics and Gynecology | Admitting: Obstetrics and Gynecology

## 2014-02-19 DIAGNOSIS — Z1231 Encounter for screening mammogram for malignant neoplasm of breast: Secondary | ICD-10-CM | POA: Insufficient documentation

## 2014-04-28 ENCOUNTER — Encounter: Payer: Self-pay | Admitting: Family Medicine

## 2014-04-28 ENCOUNTER — Encounter: Payer: Self-pay | Admitting: Nurse Practitioner

## 2014-04-28 ENCOUNTER — Ambulatory Visit (INDEPENDENT_AMBULATORY_CARE_PROVIDER_SITE_OTHER): Payer: 59 | Admitting: Nurse Practitioner

## 2014-04-28 VITALS — BP 112/68 | Temp 98.6°F | Ht 67.5 in | Wt 193.0 lb

## 2014-04-28 DIAGNOSIS — J069 Acute upper respiratory infection, unspecified: Secondary | ICD-10-CM

## 2014-04-28 DIAGNOSIS — J029 Acute pharyngitis, unspecified: Secondary | ICD-10-CM

## 2014-04-28 MED ORDER — AZITHROMYCIN 250 MG PO TABS: ORAL_TABLET | ORAL | Status: DC

## 2014-04-28 NOTE — Progress Notes (Signed)
Subjective:  Presents for c/o low grade fever, sore throat, head congestion and cough that began 2 days ago. Headache. occas nonproductive cough. Right ear pain. No rash. Taking fluids well. Voiding nl.   Objective:   BP 112/68 mmHg  Temp(Src) 98.6 F (37 C) (Oral)  Ht 5' 7.5" (1.715 m)  Wt 193 lb (87.544 kg)  BMI 29.76 kg/m2 NAD. Alert, oriented. TMs clear effusion, more on right. Pharynx several small pockets white exudate on tonsils, mild erythema. Clear PND. Neck supple with mild tender, anterior lymphadenopathy. Lungs clear. Heart RRR.  Assessment: Exudative pharyngitis  Acute upper respiratory infection  Plan: . Meds ordered this encounter  Medications  . azithromycin (ZITHROMAX Z-PAK) 250 MG tablet    Sig: Take 2 tablets (500 mg) on  Day 1,  followed by 1 tablet (250 mg) once daily on Days 2 through 5.    Dispense:  6 each    Refill:  0    Order Specific Question:  Supervising Provider    Answer:  Mikey Kirschner [[id number]]   Continue OTC meds as directed. Call back in 48-72 hours if no improvement, sooner if worse.

## 2015-01-08 ENCOUNTER — Other Ambulatory Visit (HOSPITAL_COMMUNITY): Payer: Self-pay | Admitting: Obstetrics and Gynecology

## 2015-01-08 DIAGNOSIS — Z1231 Encounter for screening mammogram for malignant neoplasm of breast: Secondary | ICD-10-CM

## 2015-02-27 ENCOUNTER — Ambulatory Visit (HOSPITAL_COMMUNITY)
Admission: RE | Admit: 2015-02-27 | Discharge: 2015-02-27 | Disposition: A | Discharge status: Home / Self Care | Payer: 59 | Source: Ambulatory Visit | Attending: Obstetrics and Gynecology | Admitting: Obstetrics and Gynecology

## 2015-02-27 ENCOUNTER — Ambulatory Visit (HOSPITAL_COMMUNITY): Payer: 59

## 2015-02-27 DIAGNOSIS — Z1231 Encounter for screening mammogram for malignant neoplasm of breast: Secondary | ICD-10-CM | POA: Diagnosis present

## 2015-04-07 MED FILL — ARMOUR THYROID 90 MG TABLET: 90 | 90 days supply | Qty: 90 | Fill #0

## 2015-04-21 ENCOUNTER — Encounter (HOSPITAL_BASED_OUTPATIENT_CLINIC_OR_DEPARTMENT_OTHER): Payer: Self-pay | Admitting: *Deleted

## 2015-04-22 ENCOUNTER — Encounter (HOSPITAL_BASED_OUTPATIENT_CLINIC_OR_DEPARTMENT_OTHER): Payer: Self-pay | Admitting: *Deleted

## 2015-04-22 ENCOUNTER — Encounter (HOSPITAL_COMMUNITY)
Admission: RE | Admit: 2015-04-22 | Discharge: 2015-04-22 | Disposition: A | Discharge status: Home / Self Care | Payer: 59 | Source: Ambulatory Visit | Attending: Obstetrics and Gynecology | Admitting: Obstetrics and Gynecology

## 2015-04-22 DIAGNOSIS — Z01812 Encounter for preprocedural laboratory examination: Secondary | ICD-10-CM | POA: Insufficient documentation

## 2015-04-22 DIAGNOSIS — N939 Abnormal uterine and vaginal bleeding, unspecified: Secondary | ICD-10-CM | POA: Insufficient documentation

## 2015-04-22 LAB — BASIC METABOLIC PANEL
Anion gap: 6 (ref 5–15)
BUN: 13 mg/dL (ref 6–20)
CALCIUM: 9 mg/dL (ref 8.9–10.3)
CO2: 28 mmol/L (ref 22–32)
CREATININE: 0.8 mg/dL (ref 0.44–1.00)
Chloride: 104 mmol/L (ref 101–111)
GFR calc non Af Amer: >60 mL/min (ref >60)
Glucose, Bld: 103 mg/dL — ABNORMAL HIGH (ref 65–99)
Potassium: 4.1 mmol/L (ref 3.5–5.1)
SODIUM: 138 mmol/L (ref 135–145)

## 2015-04-22 LAB — CBC
HCT: 38.5 % (ref 36.0–46.0)
Hemoglobin: 13.2 g/dL (ref 12.0–15.0)
MCH: 32.3 pg (ref 26.0–34.0)
MCHC: 34.3 g/dL (ref 30.0–36.0)
MCV: 94.1 fL (ref 78.0–100.0)
PLATELETS: 149 10*3/uL — AB (ref 150–400)
RBC: 4.09 MIL/uL (ref 3.87–5.11)
RDW: 12.8 % (ref 11.5–15.5)
WBC: 5.5 10*3/uL (ref 4.0–10.5)

## 2015-04-22 LAB — PREGNANCY, URINE: PREG TEST UR: NEGATIVE

## 2015-04-22 NOTE — Progress Notes (Signed)
NPO AFTER MN.  ARRIVE AT 0600.  NEEDS URINE PREG.  GETTING LAB WORK DONE TODAY AT [place] (CBC, BMET).  WILL TAKE ARMOUR THYROID AM DOS W/ SIPS OF WATER.

## 2015-04-22 NOTE — H&P (Signed)
  50 year old G 5 P 4 with abnormal bleeding. Endometrial biopsy revealed fragments of endometrial polyp.  Past Medical History  Diagnosis Date  . Anemia   . History of thyroid cancer     2013  s/p  left lobe thyroidectomy--  follicular varient papillary / lymphocyctic thyroiditis  . H/O Hashimoto thyroiditis   . Endometrial thickening on ultra sound    Past Surgical History  Procedure Laterality Date  . Endometrial ablation  07/2003  . Tubal ligation    . Thyroid lobectomy  11/24/2011    Procedure: THYROID LOBECTOMY;  Surgeon: Earnstine Regal, MD;  Location: [facility];  Service: General;  Laterality: Left;  Left Thyroid Lobectomy  . Cholecystectomy  04/1996  . D & c hysterooscopy w/ thermachoice endometrial ablation  08-13-2004  . Reduction incarcerated uterus  06-20-2000    intrauterine preg. 13 wks /  urinary retention   Prior to Admission medications   Medication Sig Start Date End Date Taking? Authorizing Provider  ibuprofen (ADVIL,MOTRIN) 200 MG tablet Take 400 mg by mouth every 6 (six) hours as needed.    Historical Provider, MD  thyroid (ARMOUR) 90 MG tablet Take 90 mg by mouth daily.    Historical Provider, MD  vitamin B-12 (CYANOCOBALAMIN) 1000 MCG tablet Take 2,000 mcg by mouth daily.    Historical Provider, MD    Allergies: Demerol; Penicillins; and Other Family History  Problem Relation Age of Onset  . Cancer Maternal Aunt     spine/back  . Cancer Paternal Grandfather     lung   Social History   Social History  . Marital Status: Married    Spouse Name: N/A  . Number of Children: N/A  . Years of Education: N/A   Occupational History  . Not on file.   Social History Main Topics  . Smoking status: Former Smoker    Quit date: 10/31/1991  . Smokeless tobacco: Never Used  . Alcohol Use: No  . Drug Use: No  . Sexual Activity: Not on file   Other Topics Concern  . Not on file   Social History Narrative   There were no vitals taken for this visit. General  alert and oriented Lung CTAB Car RRR Abdomen is soft and non tender Pelvic Above  IMPRESSION: Endometrial Polyp  PLAN: D and C Hysteroscopy Risks reviewed Consent signed

## 2015-04-30 ENCOUNTER — Ambulatory Visit (HOSPITAL_BASED_OUTPATIENT_CLINIC_OR_DEPARTMENT_OTHER): Payer: 59 | Admitting: Anesthesiology

## 2015-04-30 ENCOUNTER — Encounter (HOSPITAL_BASED_OUTPATIENT_CLINIC_OR_DEPARTMENT_OTHER)
Admission: RE | Disposition: A | Discharge status: Home / Self Care | Payer: Self-pay | Source: Ambulatory Visit | Attending: Obstetrics and Gynecology

## 2015-04-30 ENCOUNTER — Ambulatory Visit (HOSPITAL_BASED_OUTPATIENT_CLINIC_OR_DEPARTMENT_OTHER)
Admission: RE | Admit: 2015-04-30 | Discharge: 2015-04-30 | Disposition: A | Discharge status: Home / Self Care | Payer: 59 | Source: Ambulatory Visit | Attending: Obstetrics and Gynecology | Admitting: Obstetrics and Gynecology

## 2015-04-30 ENCOUNTER — Encounter (HOSPITAL_BASED_OUTPATIENT_CLINIC_OR_DEPARTMENT_OTHER): Payer: Self-pay | Admitting: *Deleted

## 2015-04-30 DIAGNOSIS — Z85850 Personal history of malignant neoplasm of thyroid: Secondary | ICD-10-CM | POA: Diagnosis not present

## 2015-04-30 DIAGNOSIS — Z888 Allergy status to other drugs, medicaments and biological substances status: Secondary | ICD-10-CM | POA: Insufficient documentation

## 2015-04-30 DIAGNOSIS — Z87891 Personal history of nicotine dependence: Secondary | ICD-10-CM | POA: Diagnosis not present

## 2015-04-30 DIAGNOSIS — N882 Stricture and stenosis of cervix uteri: Secondary | ICD-10-CM | POA: Insufficient documentation

## 2015-04-30 DIAGNOSIS — Z880 Allergy status to penicillin: Secondary | ICD-10-CM | POA: Diagnosis not present

## 2015-04-30 DIAGNOSIS — Z9049 Acquired absence of other specified parts of digestive tract: Secondary | ICD-10-CM | POA: Diagnosis not present

## 2015-04-30 DIAGNOSIS — Z79899 Other long term (current) drug therapy: Secondary | ICD-10-CM | POA: Insufficient documentation

## 2015-04-30 DIAGNOSIS — N840 Polyp of corpus uteri: Secondary | ICD-10-CM | POA: Diagnosis not present

## 2015-04-30 DIAGNOSIS — N938 Other specified abnormal uterine and vaginal bleeding: Secondary | ICD-10-CM | POA: Insufficient documentation

## 2015-04-30 DIAGNOSIS — K219 Gastro-esophageal reflux disease without esophagitis: Secondary | ICD-10-CM | POA: Insufficient documentation

## 2015-04-30 DIAGNOSIS — Z885 Allergy status to narcotic agent status: Secondary | ICD-10-CM | POA: Diagnosis not present

## 2015-04-30 DIAGNOSIS — N939 Abnormal uterine and vaginal bleeding, unspecified: Secondary | ICD-10-CM | POA: Diagnosis not present

## 2015-04-30 DIAGNOSIS — E039 Hypothyroidism, unspecified: Secondary | ICD-10-CM | POA: Insufficient documentation

## 2015-04-30 DIAGNOSIS — Z9851 Tubal ligation status: Secondary | ICD-10-CM | POA: Diagnosis not present

## 2015-04-30 DIAGNOSIS — N736 Female pelvic peritoneal adhesions (postinfective): Secondary | ICD-10-CM | POA: Diagnosis not present

## 2015-04-30 HISTORY — DX: Personal history of malignant neoplasm of thyroid: Z85.850

## 2015-04-30 HISTORY — DX: Hypothyroidism, unspecified: E03.9

## 2015-04-30 HISTORY — PX: HYSTEROSCOPY WITH D & C: SHX1775

## 2015-04-30 HISTORY — DX: Personal history of other endocrine, nutritional and metabolic disease: Z86.39

## 2015-04-30 HISTORY — DX: Abnormal findings on diagnostic imaging of other specified body structures: R93.89

## 2015-04-30 HISTORY — DX: Polyp of corpus uteri: N84.0

## 2015-04-30 HISTORY — DX: Gastro-esophageal reflux disease without esophagitis: K21.9

## 2015-04-30 SURGERY — DILATATION AND CURETTAGE /HYSTEROSCOPY
Anesthesia: General | Site: Vagina

## 2015-04-30 MED ORDER — FENTANYL CITRATE (PF) 100 MCG/2ML IJ SOLN
25.0000 ug | INTRAMUSCULAR | Status: DC | PRN
  Filled 2015-04-30: qty 1

## 2015-04-30 MED ORDER — OXYCODONE HCL 5 MG PO TABS
ORAL_TABLET | ORAL | Status: AC
  Filled 2015-04-30: qty 1

## 2015-04-30 MED ORDER — DEXAMETHASONE SODIUM PHOSPHATE 10 MG/ML IJ SOLN
INTRAMUSCULAR | Status: AC
  Filled 2015-04-30: qty 1

## 2015-04-30 MED ORDER — ONDANSETRON HCL 4 MG/2ML IJ SOLN
4.0000 mg | Freq: Four times a day (QID) | INTRAMUSCULAR | Status: DC | PRN
  Filled 2015-04-30: qty 2

## 2015-04-30 MED ORDER — DEXAMETHASONE SODIUM PHOSPHATE 4 MG/ML IJ SOLN
INTRAMUSCULAR | Status: DC | PRN
  Administered 2015-04-30: 10 mg via INTRAVENOUS

## 2015-04-30 MED ORDER — DEXTROSE 5 % IV SOLN: INTRAVENOUS | Status: DC

## 2015-04-30 MED ORDER — ACETAMINOPHEN 325 MG PO TABS
ORAL_TABLET | ORAL | Status: DC | PRN
  Administered 2015-04-30: 1000 mg via ORAL

## 2015-04-30 MED ORDER — LIDOCAINE HCL (CARDIAC) 20 MG/ML IV SOLN
INTRAVENOUS | Status: DC | PRN
  Administered 2015-04-30: 100 mg via INTRAVENOUS

## 2015-04-30 MED ORDER — FENTANYL CITRATE (PF) 100 MCG/2ML IJ SOLN
INTRAMUSCULAR | Status: AC
  Filled 2015-04-30: qty 2

## 2015-04-30 MED ORDER — PROPOFOL 10 MG/ML IV BOLUS
INTRAVENOUS | Status: AC
  Filled 2015-04-30: qty 20

## 2015-04-30 MED ORDER — PROPOFOL 10 MG/ML IV BOLUS
INTRAVENOUS | Status: DC | PRN
  Administered 2015-04-30: 200 mg via INTRAVENOUS

## 2015-04-30 MED ORDER — LIDOCAINE HCL 1 % IJ SOLN
INTRAMUSCULAR | Status: DC | PRN
  Administered 2015-04-30: 10 mL

## 2015-04-30 MED ORDER — GLYCINE 1.5 % IR SOLN
Status: DC | PRN
  Administered 2015-04-30: 3000 mL

## 2015-04-30 MED ORDER — OXYCODONE HCL 5 MG/5ML PO SOLN
5.0000 mg | Freq: Once | ORAL | Status: AC | PRN
  Filled 2015-04-30: qty 5

## 2015-04-30 MED ORDER — OXYCODONE HCL 5 MG PO TABS
5.0000 mg | ORAL_TABLET | Freq: Once | ORAL | Status: AC | PRN
  Administered 2015-04-30: 5 mg via ORAL
  Filled 2015-04-30: qty 1

## 2015-04-30 MED ORDER — FENTANYL CITRATE (PF) 100 MCG/2ML IJ SOLN
INTRAMUSCULAR | Status: DC | PRN
  Administered 2015-04-30 (×2): 50 ug via INTRAVENOUS

## 2015-04-30 MED ORDER — LACTATED RINGERS IV SOLN
INTRAVENOUS | Status: DC
  Filled 2015-04-30: qty 1000

## 2015-04-30 MED ORDER — LIDOCAINE HCL (CARDIAC) 20 MG/ML IV SOLN
INTRAVENOUS | Status: AC
  Filled 2015-04-30: qty 5

## 2015-04-30 MED ORDER — WHITE PETROLATUM GEL
Status: AC
  Filled 2015-04-30: qty 5

## 2015-04-30 MED ORDER — MIDAZOLAM HCL 5 MG/5ML IJ SOLN
INTRAMUSCULAR | Status: DC | PRN
  Administered 2015-04-30: 2 mg via INTRAVENOUS

## 2015-04-30 MED ORDER — DEXTROSE 5 % IV SOLN
Freq: Once | INTRAVENOUS | Status: AC
  Administered 2015-04-30: 370 mL via INTRAVENOUS
  Administered 2015-04-30: 900 mL via INTRAVENOUS
  Filled 2015-04-30: qty 9.25

## 2015-04-30 MED ORDER — ONDANSETRON HCL 4 MG/2ML IJ SOLN
INTRAMUSCULAR | Status: AC
  Filled 2015-04-30: qty 2

## 2015-04-30 MED ORDER — ACETAMINOPHEN 500 MG PO TABS
ORAL_TABLET | ORAL | Status: AC
  Filled 2015-04-30: qty 2

## 2015-04-30 MED ORDER — LACTATED RINGERS IV SOLN
INTRAVENOUS | Status: DC
  Administered 2015-04-30: 07:00:00 via INTRAVENOUS
  Filled 2015-04-30: qty 1000

## 2015-04-30 MED ORDER — KETOROLAC TROMETHAMINE 30 MG/ML IJ SOLN
INTRAMUSCULAR | Status: DC | PRN
Start: 2015-04-30 — End: 2015-04-30
  Administered 2015-04-30: 30 mg via INTRAVENOUS

## 2015-04-30 MED ORDER — ONDANSETRON HCL 4 MG/2ML IJ SOLN
INTRAMUSCULAR | Status: DC | PRN
  Administered 2015-04-30: 4 mg via INTRAVENOUS

## 2015-04-30 MED ORDER — KETOROLAC TROMETHAMINE 30 MG/ML IJ SOLN
INTRAMUSCULAR | Status: AC
  Filled 2015-04-30: qty 1

## 2015-04-30 MED ORDER — MIDAZOLAM HCL 2 MG/2ML IJ SOLN
INTRAMUSCULAR | Status: AC
  Filled 2015-04-30: qty 2

## 2015-04-30 SURGICAL SUPPLY — 21 items
CATH ROBINSON RED A/P 16FR (CATHETERS) ×2 IMPLANT
COVER BACK TABLE 60X90IN (DRAPES) ×2 IMPLANT
DRAPE LG THREE QUARTER DISP (DRAPES) ×2 IMPLANT
DRSG TELFA 3X8 NADH (GAUZE/BANDAGES/DRESSINGS) ×4 IMPLANT
ELECT REM PT RETURN 9FT ADLT (ELECTROSURGICAL)
ELECTRODE REM PT RTRN 9FT ADLT (ELECTROSURGICAL) IMPLANT
GLOVE BIO SURGEON STRL SZ 6.5 (GLOVE) ×5 IMPLANT
GOWN STRL REUS W/ TWL LRG LVL3 (GOWN DISPOSABLE) ×2 IMPLANT
GOWN STRL REUS W/TWL LRG LVL3 (GOWN DISPOSABLE) ×4
KIT ROOM TURNOVER WOR (KITS) ×2 IMPLANT
LEGGING LITHOTOMY PAIR STRL (DRAPES) ×2 IMPLANT
LOOP ANGLED CUTTING 22FR (CUTTING LOOP) IMPLANT
NDL SPNL 25GX3.5 QUINCKE BL (NEEDLE) ×1 IMPLANT
NEEDLE SPNL 25GX3.5 QUINCKE BL (NEEDLE) ×2 IMPLANT
PACK BASIN DAY SURGERY FS (CUSTOM PROCEDURE TRAY) ×1 IMPLANT
PAD DRESSING TELFA 3X8 NADH (GAUZE/BANDAGES/DRESSINGS) ×2 IMPLANT
SYR CONTROL 10ML LL (SYRINGE) ×1 IMPLANT
TOWEL OR 17X24 6PK STRL BLUE (TOWEL DISPOSABLE) ×4 IMPLANT
TRAY DSU PREP LF (CUSTOM PROCEDURE TRAY) ×2 IMPLANT
TUBING AQUILEX INFLOW (TUBING) ×2 IMPLANT
TUBING AQUILEX OUTFLOW (TUBING) ×2 IMPLANT

## 2015-04-30 NOTE — Discharge Instructions (Signed)
° °  D & C Home care Instructions: ° ° °Personal hygiene:  Used sanitary napkins for vaginal drainage not tampons. Shower or tub bathe the day after your procedure. No douching until bleeding stops. Always wipe from front to back after  Elimination. ° °Activity: Do not drive or operate any equipment today. The effects of the anesthesia are still present and drowsiness may result. Rest today, not necessarily flat bed rest, just take it easy. You may resume your normal activity in one to 2 days. ° °Sexual activity: No intercourse for one week or as indicated by your physician ° °Diet: Eat a light diet as desired this evening. You may resume a regular diet tomorrow. ° °Return to work: One to 2 days. ° °General Expectations of your surgery: Vaginal bleeding should be no heavier than a normal period. Spotting may continue up to 10 days. Mild cramps may continue for a couple of days. You may have a regular period in 2-6 weeks. ° °Unexpected observations call your doctor if these occur: persistent or heavy bleeding. Severe abdominal cramping or pain. Elevation of temperature greater than 100°F. ° °Call for an appointment in one week. ° ° ° °Patient's Signature_______________________________________________________ ° °Nurse's Signature________________________________________________________ ° ° °Post Anesthesia Home Care Instructions ° ° °Activity: °Get plenty of rest for the remainder of the day. A responsible adult should stay with you for 24 hours following the procedure.  °For the next 24 hours, DO NOT: °-Drive a car °-Operate machinery °-Drink alcoholic beverages °-Take any medication unless instructed by your physician °-Make any legal decisions or sign important papers. ° °Meals: °Start with liquid foods such as gelatin or soup. Progress to regular foods as tolerated. Avoid greasy, spicy, heavy foods. If nausea and/or vomiting occur, drink only clear liquids until the nausea and/or vomiting subsides. Call your  physician if vomiting continues. ° °Special Instructions/Symptoms: °Your throat may feel dry or sore from the anesthesia or the breathing tube placed in your throat during surgery. If this causes discomfort, gargle with warm salt water. The discomfort should disappear within 24 hours. ° °If you had a scopolamine patch placed behind your ear for the management of post- operative nausea and/or vomiting: ° °1. The medication in the patch is effective for 72 hours, after which it should be removed.  Wrap patch in a tissue and discard in the trash. Wash hands thoroughly with soap and water. °2. You may remove the patch earlier than 72 hours if you experience unpleasant side effects which may include dry mouth, dizziness or visual disturbances. °3. Avoid touching the patch. Wash your hands with soap and water after contact with the patch. °  ° °

## 2015-04-30 NOTE — Progress Notes (Signed)
H and P on the chart No changes Will proceed with D and C, Hysteroscopy Consent signed 

## 2015-04-30 NOTE — Anesthesia Procedure Notes (Signed)
Procedure Name: LMA Insertion Date/Time: 04/30/2015 7:32 AM Performed by: Bethena Roys T Pre-anesthesia Checklist: Patient identified, Emergency Drugs available, Suction available and Patient being monitored Patient Re-evaluated:Patient Re-evaluated prior to inductionOxygen Delivery Method: Circle System Utilized Preoxygenation: Pre-oxygenation with 100% oxygen Intubation Type: IV induction Ventilation: Mask ventilation without difficulty LMA: LMA inserted LMA Size: 5.0 Number of attempts: 1 Airway Equipment and Method: Bite block Placement Confirmation: positive ETCO2 Dental Injury: Teeth and Oropharynx as per pre-operative assessment

## 2015-04-30 NOTE — Anesthesia Preprocedure Evaluation (Signed)
Anesthesia Evaluation  Patient identified by MRN, date of birth, ID band Patient awake    Reviewed: Allergy & Precautions, NPO status , Patient's Chart, lab work & pertinent test results  Airway Mallampati: II   Neck ROM: full    Dental   Pulmonary former smoker,    breath sounds clear to auscultation       Cardiovascular negative cardio ROS   Rhythm:regular Rate:Normal     Neuro/Psych  Neuromuscular disease    GI/Hepatic GERD  ,  Endo/Other  Hypothyroidism   Renal/GU      Musculoskeletal   Abdominal   Peds  Hematology   Anesthesia Other Findings   Reproductive/Obstetrics                             Anesthesia Physical Anesthesia Plan  ASA: II  Anesthesia Plan: General   Post-op Pain Management:    Induction: Intravenous  Airway Management Planned: LMA  Additional Equipment:   Intra-op Plan:   Post-operative Plan:   Informed Consent: I have reviewed the patients History and Physical, chart, labs and discussed the procedure including the risks, benefits and alternatives for the proposed anesthesia with the patient or authorized representative who has indicated his/her understanding and acceptance.     Plan Discussed with: CRNA, Anesthesiologist and Surgeon  Anesthesia Plan Comments:         Anesthesia Quick Evaluation

## 2015-04-30 NOTE — Transfer of Care (Signed)
Immediate Anesthesia Transfer of Care Note  Patient: Samantha Reynolds  Procedure(s) Performed: Procedure(s): DILATATION AND CURETTAGE / INTENDED HYSTEROSCOPY (N/A)  Patient Location: PACU  Anesthesia Type:General  Level of Consciousness: awake, alert  and oriented  Airway & Oxygen Therapy: Patient Spontanous Breathing and Patient connected to nasal cannula oxygen  Post-op Assessment: Report given to RN  Post vital signs: Reviewed and stable  Last Vitals:  Filed Vitals:   04/30/15 0612 04/30/15 0811  BP: 114/82 128/78  Pulse: 98 101  Temp: 36.9 C 36.6 C  Resp: 16 14    Complications: No apparent anesthesia complications

## 2015-04-30 NOTE — Brief Op Note (Signed)
04/30/2015  8:03 AM  PATIENT:  Samantha Reynolds  50 y.o. female  PRE-OPERATIVE DIAGNOSIS:   Endometrial polyp   POST-OPERATIVE DIAGNOSIS:   Endometrial Polyp Cervical Stenosis Intrauterine adhesions  PROCEDURE:  Procedure(s): DILATATION AND CURETTAGE / INTENDED HYSTEROSCOPY (N/A)     SURGEON:  Surgeon(s) and Role:    * Dian Queen, MD - Primary  PHYSICIAN ASSISTANT:   ASSISTANTS: none   ANESTHESIA:   paracervical block and MAC  EBL:  Total I/O In: 400 [I.V.:400] Out: -   BLOOD ADMINISTERED:none  DRAINS: none   LOCAL MEDICATIONS USED:  LIDOCAINE   SPECIMEN:  Source of Specimen:  uterine currettings  DISPOSITION OF SPECIMEN:  PATHOLOGY  COUNTS:  YES  TOURNIQUET:  * No tourniquets in log *  DICTATION: .Other Dictation: Dictation Number [id number]  PLAN OF CARE: Other Dictation: Dictation Number [id number]  PATIENT DISPOSITION:  PACU - hemodynamically stable.   Delay start of Pharmacological VTE agent (>24hrs) due to surgical blood loss or risk of bleeding: not applicable

## 2015-04-30 NOTE — Anesthesia Postprocedure Evaluation (Signed)
Anesthesia Post Note  Patient: Samantha Reynolds  Procedure(s) Performed: Procedure(s) (LRB): DILATATION AND CURETTAGE / INTENDED HYSTEROSCOPY (N/A)  Patient location during evaluation: PACU Anesthesia Type: General Level of consciousness: awake and alert and patient cooperative Pain management: pain level controlled Vital Signs Assessment: post-procedure vital signs reviewed and stable Respiratory status: spontaneous breathing and respiratory function stable Cardiovascular status: stable Anesthetic complications: no    Last Vitals:  Filed Vitals:   04/30/15 0845 04/30/15 0923  BP: 108/77 108/73  Pulse: 78 77  Temp:  36.7 C  Resp: 16 14    Last Pain:  Filed Vitals:   04/30/15 0926  PainSc: [place]

## 2015-05-01 ENCOUNTER — Encounter (HOSPITAL_BASED_OUTPATIENT_CLINIC_OR_DEPARTMENT_OTHER): Payer: Self-pay | Admitting: Obstetrics and Gynecology

## 2015-05-01 NOTE — Op Note (Signed)
NAMELUSINE, GARI               ACCOUNT NO.:  [id number]  MEDICAL RECORD NO.:  [id number]  LOCATION:                                 FACILITY:  PHYSICIAN:  Candon Caras L. Vernona Peake, M.D.DATE OF BIRTH:  Nov 19, 1965  DATE OF PROCEDURE:  04/30/2015 DATE OF DISCHARGE:                              OPERATIVE REPORT   PREOPERATIVE DIAGNOSES:  Endometrial polyps and abnormal uterine bleeding.  POSTOPERATIVE DIAGNOSES:  Endometrial polyps and abnormal uterine bleeding and cervical stenosis and intrauterine adhesions.  SURGEON:  Remmy Crass L. Helane Rima, M.D.  ANESTHESIA:  MAC with paracervical.  PATHOLOGY:  Uterine curettings.  PROCEDURE:  Attempted diagnostic hysteroscopy with limited visualization and dilation and curettage and removal of endometrial tissue.  ESTIMATED BLOOD LOSS:  Minimal.  COMPLICATIONS:  None.  INDICATION FOR PROCEDURE:  This is a 50 year old female, who is postmenopausal.  She had history of endometrial ablation many years ago for menorrhagia.  She presented with abnormal uterine bleeding. Endometrial biopsy in the office had revealed fragments of endometrial polyps.  The patient was counseled for hysteroscopy and D and C.  The risks have been reviewed with the patient.  Consent was signed.  DESCRIPTION OF PROCEDURE:  She was taken to the operating room and was administered anesthesia.  She was prepped and draped and an In- and out catheter used to empty the bladder.  A speculum was inserted into the vagina.  The cervix was grasped with a tenaculum.  The uterus was noted to be retroverted.  Paracervical block was performed.  The cervical internal os was noted to be very stenotic internally consistent with cervical stenosis.  We gently used the [facility] dilators starting with the smallest and attempted to dilate.  With the hysteroscope in, I could see that the cervix was very stenotic at the level of the internal os where it is just open a very small amount, and it  funneled from the external os to the internal os.  I believe she has significant adhesions from her history of endometrial ablation as well as from her history of being postmenopausal with not on any hormone therapy.  We attempted to insert the hysteroscope, was very limited, felt very stiff, and I was concerned that we might perforate the uterus, so my visualization was limited.  I removed the hysteroscope and then inserted a sharp curette which I was able to insert into the uterine cavity and thoroughly curette tissue. There was a minimal amount of tissue and some of it appeared to be polypoid.  I then probed the cavity with skinny polyp forceps and cannot feel any intracavitary lesions.  All tissue was sent to pathology for analysis.  All other instruments were removed from the vagina.  All sponge, lap, and instrument counts were correct x2.     Preeti Winegardner L. Helane Rima, M.D.     Nevin Bloodgood  D:  04/30/2015  T:  04/30/2015  Job:  [id number]

## 2015-05-14 ENCOUNTER — Other Ambulatory Visit (INDEPENDENT_AMBULATORY_CARE_PROVIDER_SITE_OTHER): Payer: Self-pay | Admitting: *Deleted

## 2015-05-14 DIAGNOSIS — Z1211 Encounter for screening for malignant neoplasm of colon: Secondary | ICD-10-CM

## 2015-07-06 ENCOUNTER — Other Ambulatory Visit (INDEPENDENT_AMBULATORY_CARE_PROVIDER_SITE_OTHER): Payer: Self-pay | Admitting: *Deleted

## 2015-07-06 ENCOUNTER — Encounter (INDEPENDENT_AMBULATORY_CARE_PROVIDER_SITE_OTHER): Payer: Self-pay | Admitting: *Deleted

## 2015-07-06 NOTE — Telephone Encounter (Signed)
Patient needs suprep 

## 2015-07-07 MED ORDER — SUPREP BOWEL PREP KIT 17.5-3.13-1.6 GM/177ML PO SOLN: 1.0000 | Freq: Once | ORAL | Status: DC

## 2015-07-14 MED FILL — ARMOUR THYROID 90 MG TABLET: 90 | 90 days supply | Qty: 90 | Fill #1

## 2015-07-23 ENCOUNTER — Telehealth (INDEPENDENT_AMBULATORY_CARE_PROVIDER_SITE_OTHER): Payer: Self-pay | Admitting: *Deleted

## 2015-07-23 NOTE — Telephone Encounter (Signed)
Referring MD/PCP: scott luking   Procedure: tcs  Reason/Indication:  screenng  Has patient had this procedure before?  no  If so, when, by whom and where?    Is there a family history of colon cancer?  no  Who?  What age when diagnosed?    Is patient diabetic?   no      Does patient have prosthetic heart valve or mechanical valve?  no  Do you have a pacemaker?  no  Has patient ever had endocarditis? no  Has patient had joint replacement within last 12 months?  no  Does patient tend to be constipated or take laxatives? no  Does patient have a history of alcohol/drug use?  no  Is patient on Coumadin, Plavix and/or Aspirin? no  Medications: armour thyroid 90 mg daily,  Allergies: see epic  Medication Adjustment:   Procedure date & time: 08/20/15 at 730

## 2015-07-24 NOTE — Telephone Encounter (Signed)
agree

## 2015-08-05 MED FILL — SUPREP BOWEL PREP KIT: 17.5-3.13-1 | 1 days supply | Qty: 354 | Fill #0

## 2015-08-20 ENCOUNTER — Ambulatory Visit (HOSPITAL_COMMUNITY)
Admission: RE | Admit: 2015-08-20 | Discharge: 2015-08-20 | Disposition: A | Discharge status: Home Health | Payer: 59 | Source: Ambulatory Visit | Attending: Internal Medicine | Admitting: Internal Medicine

## 2015-08-20 ENCOUNTER — Encounter (HOSPITAL_COMMUNITY)
Admission: RE | Disposition: A | Discharge status: Home Health | Payer: Self-pay | Source: Ambulatory Visit | Attending: Internal Medicine

## 2015-08-20 ENCOUNTER — Encounter (HOSPITAL_COMMUNITY): Payer: Self-pay | Admitting: *Deleted

## 2015-08-20 DIAGNOSIS — D123 Benign neoplasm of transverse colon: Secondary | ICD-10-CM | POA: Insufficient documentation

## 2015-08-20 DIAGNOSIS — K644 Residual hemorrhoidal skin tags: Secondary | ICD-10-CM | POA: Insufficient documentation

## 2015-08-20 DIAGNOSIS — Z801 Family history of malignant neoplasm of trachea, bronchus and lung: Secondary | ICD-10-CM | POA: Diagnosis not present

## 2015-08-20 DIAGNOSIS — Z1211 Encounter for screening for malignant neoplasm of colon: Secondary | ICD-10-CM | POA: Insufficient documentation

## 2015-08-20 DIAGNOSIS — Z87891 Personal history of nicotine dependence: Secondary | ICD-10-CM | POA: Insufficient documentation

## 2015-08-20 DIAGNOSIS — Z85850 Personal history of malignant neoplasm of thyroid: Secondary | ICD-10-CM | POA: Diagnosis not present

## 2015-08-20 DIAGNOSIS — E890 Postprocedural hypothyroidism: Secondary | ICD-10-CM | POA: Diagnosis not present

## 2015-08-20 DIAGNOSIS — K219 Gastro-esophageal reflux disease without esophagitis: Secondary | ICD-10-CM | POA: Diagnosis not present

## 2015-08-20 HISTORY — PX: COLONOSCOPY: SHX5424

## 2015-08-20 HISTORY — PX: POLYPECTOMY: SHX5525

## 2015-08-20 SURGERY — COLONOSCOPY
Anesthesia: Moderate Sedation

## 2015-08-20 MED ORDER — FENTANYL CITRATE (PF) 100 MCG/2ML IJ SOLN
INTRAMUSCULAR | Status: DC | PRN
  Administered 2015-08-20 (×3): 25 ug via INTRAVENOUS

## 2015-08-20 MED ORDER — MEPERIDINE HCL 50 MG/ML IJ SOLN
INTRAMUSCULAR | Status: AC
  Filled 2015-08-20: qty 1

## 2015-08-20 MED ORDER — SIMETHICONE 40 MG/0.6ML PO SUSP
ORAL | Status: DC
Start: 2015-08-20 — End: 2015-08-20
  Filled 2015-08-20: qty 30

## 2015-08-20 MED ORDER — MIDAZOLAM HCL 5 MG/5ML IJ SOLN
INTRAMUSCULAR | Status: AC
  Filled 2015-08-20: qty 10

## 2015-08-20 MED ORDER — SIMETHICONE 40 MG/0.6ML PO SUSP
ORAL | Status: DC | PRN
  Administered 2015-08-20: 08:00:00

## 2015-08-20 MED ORDER — MIDAZOLAM HCL 5 MG/5ML IJ SOLN
INTRAMUSCULAR | Status: DC | PRN
  Administered 2015-08-20 (×2): 2 mg via INTRAVENOUS
  Administered 2015-08-20 (×2): 3 mg via INTRAVENOUS

## 2015-08-20 MED ORDER — FENTANYL CITRATE (PF) 100 MCG/2ML IJ SOLN
INTRAMUSCULAR | Status: DC
Start: 2015-08-20 — End: 2015-08-20
  Filled 2015-08-20: qty 2

## 2015-08-20 MED ORDER — SODIUM CHLORIDE 0.9 % IV SOLN
INTRAVENOUS | Status: DC
  Administered 2015-08-20: 07:00:00 via INTRAVENOUS

## 2015-08-20 NOTE — Op Note (Signed)
[facility] Patient Name: Samantha Reynolds Procedure Date: 08/20/2015 7:31 AM MRN: [id number] Date of Birth: 11-Aug-1965 Attending MD: Hildred Laser , MD CSN: [id number] Age: 50 Admit Type: Outpatient Procedure:                Colonoscopy Indications:              Screening for colorectal malignant neoplasm Providers:                Hildred Laser, MD, Renda Rolls, RN, Georgeann Oppenheim,                            Technician Referring MD:             Elayne Snare. Wolfgang Phoenix, MD Medicines:                Fentanyl 75 micrograms IV, Midazolam 10 mg IV Complications:            No immediate complications. Estimated Blood Loss:     Estimated blood loss: none. Procedure:                Pre-Anesthesia Assessment:                           - Prior to the procedure, a History and Physical                            was performed, and patient medications and                            allergies were reviewed. The patient's tolerance of                            previous anesthesia was also reviewed. The risks                            and benefits of the procedure and the sedation                            options and risks were discussed with the patient.                            All questions were answered, and informed consent                            was obtained. Prior Anticoagulants: The patient                            last took ibuprofen 3 days prior to the procedure.                            ASA Grade Assessment: I - A normal, healthy                            patient. After reviewing the risks and benefits,  the patient was deemed in satisfactory condition to                            undergo the procedure.                           After obtaining informed consent, the colonoscope                            was passed under direct vision. Throughout the                            procedure, the patient's blood pressure, pulse, and                             oxygen saturations were monitored continuously. The                            [id number]) scope was introduced through                            the anus and advanced to the the cecum, identified                            by appendiceal orifice and ileocecal valve. The                            colonoscopy was performed without difficulty. The                            patient tolerated the procedure well. The quality                            of the bowel preparation was excellent. The                            ileocecal valve, appendiceal orifice, and rectum                            were photographed. Scope In: 7:49:42 AM Scope Out: 8:05:35 AM Scope Withdrawal Time: 0 hours 7 minutes 49 seconds  Total Procedure Duration: 0 hours 15 minutes 53 seconds  Findings:      A small polyp was found in the splenic flexure. The polyp was sessile.       Biopsies were taken with a cold forceps for histology.      The exam was otherwise normal throughout the examined colon.      External hemorrhoids were found during retroflexion. The hemorrhoids       were small. Impression:               - One small polyp at the splenic flexure. Biopsied.                           - External hemorrhoids. Moderate Sedation:      Moderate (conscious) sedation was administered  by the endoscopy nurse       and supervised by the endoscopist. The following parameters were       monitored: oxygen saturation, heart rate, blood pressure, CO2       capnography and response to care. Total physician intraservice time was       26 minutes. Recommendation:           - Patient has a contact number available for                            emergencies. The signs and symptoms of potential                            delayed complications were discussed with the                            patient. Return to normal activities tomorrow.                            Written discharge instructions were provided to the                             patient.                           - Patient has a contact number available for                            emergencies. The signs and symptoms of potential                            delayed complications were discussed with the                            patient. Return to normal activities tomorrow.                            Written discharge instructions were provided to the                            patient.                           - Resume previous diet today.                           - Continue present medications.                           - Await pathology results.                           - Repeat colonoscopy for surveillance based on                            pathology results. Procedure Code(s):        --- Professional ---  [id number], Colonoscopy, flexible; with biopsy, single                            or multiple                           99152, Moderate sedation services provided by the                            same physician or other qualified health care                            professional performing the diagnostic or                            therapeutic service that the sedation supports,                            requiring the presence of an independent trained                            observer to assist in the monitoring of the                            patient's level of consciousness and physiological                            status; initial 15 minutes of intraservice time,                            patient age 64 years or older                           [phone], Moderate sedation services; each additional                            15 minutes intraservice time Diagnosis Code(s):        --- Professional ---                           Z12.11, Encounter for screening for malignant                            neoplasm of colon                           D12.3, Benign neoplasm of transverse colon (hepatic                             flexure or splenic flexure)                           K64.4, Residual hemorrhoidal skin tags CPT copyright 2016 American Medical Association. All rights reserved. The codes documented in this report are preliminary and upon coder review may  be revised to meet current compliance requirements. [employer]  Laural Golden, MD Hildred Laser, MD 08/20/2015 8:13:47 AM This report has been signed electronically. Number of Addenda: 0

## 2015-08-20 NOTE — Discharge Instructions (Signed)
Resume usual diet and medications. No driving for 24 hours. Physician will call with biopsy results.  Colonoscopy, Care After Refer to this sheet in the next few weeks. These instructions provide you with information on caring for yourself after your procedure. Your health care provider may also give you more specific instructions. Your treatment has been planned according to current medical practices, but problems sometimes occur. Call your health care provider if you have any problems or questions after your procedure. WHAT TO EXPECT AFTER THE PROCEDURE  After your procedure, it is typical to have the following:  A small amount of blood in your stool.  Moderate amounts of gas and mild abdominal cramping or bloating. HOME CARE INSTRUCTIONS  Do not drive, operate machinery, or sign important documents for 24 hours.  You may shower and resume your regular physical activities, but move at a slower pace for the first 24 hours.  Take frequent rest periods for the first 24 hours.  Walk around or put a warm pack on your abdomen to help reduce abdominal cramping and bloating.  Drink enough fluids to keep your urine clear or pale yellow.  You may resume your normal diet as instructed by your health care provider. Avoid heavy or fried foods that are hard to digest.  Avoid drinking alcohol for 24 hours or as instructed by your health care provider.  Only take over-the-counter or prescription medicines as directed by your health care provider.  If a tissue sample (biopsy) was taken during your procedure:  Do not take aspirin or blood thinners for 7 days, or as instructed by your health care provider.  Do not drink alcohol for 7 days, or as instructed by your health care provider.  Eat soft foods for the first 24 hours. SEEK MEDICAL CARE IF: You have persistent spotting of blood in your stool 2-3 days after the procedure. SEEK IMMEDIATE MEDICAL CARE IF:  You have more than a small  spotting of blood in your stool.  You pass large blood clots in your stool.  Your abdomen is swollen (distended).  You have nausea or vomiting.  You have a fever.  You have increasing abdominal pain that is not relieved with medicine.   This information is not intended to replace advice given to you by your health care provider. Make sure you discuss any questions you have with your health care provider.   Colon Polyps Polyps are lumps of extra tissue growing inside the body. Polyps can grow in the large intestine (colon). Most colon polyps are noncancerous (benign). However, some colon polyps can become cancerous over time. Polyps that are larger than a pea may be harmful. To be safe, caregivers remove and test all polyps. CAUSES  Polyps form when mutations in the genes cause your cells to grow and divide even though no more tissue is needed. RISK FACTORS There are a number of risk factors that can increase your chances of getting colon polyps. They include:  Being older than 50 years.  Family history of colon polyps or colon cancer.  Long-term colon diseases, such as colitis or Crohn disease.  Being overweight.  Smoking.  Being inactive.  Drinking too much alcohol. SYMPTOMS  Most small polyps do not cause symptoms. If symptoms are present, they may include:  Blood in the stool. The stool may look dark red or black.  Constipation or diarrhea that lasts longer than 1 week. DIAGNOSIS People often do not know they have polyps until their caregiver finds them  during a regular checkup. Your caregiver can use 4 tests to check for polyps:  Digital rectal exam. The caregiver wears gloves and feels inside the rectum. This test would find polyps only in the rectum.  Barium enema. The caregiver puts a liquid called barium into your rectum before taking X-rays of your colon. Barium makes your colon look white. Polyps are dark, so they are easy to see in the X-ray  pictures.  Sigmoidoscopy. A thin, flexible tube (sigmoidoscope) is placed into your rectum. The sigmoidoscope has a light and tiny camera in it. The caregiver uses the sigmoidoscope to look at the last third of your colon.  Colonoscopy. This test is like sigmoidoscopy, but the caregiver looks at the entire colon. This is the most common method for finding and removing polyps. TREATMENT  Any polyps will be removed during a sigmoidoscopy or colonoscopy. The polyps are then tested for cancer. PREVENTION  To help lower your risk of getting more colon polyps:  Eat plenty of fruits and vegetables. Avoid eating fatty foods.  Do not smoke.  Avoid drinking alcohol.  Exercise every day.  Lose weight if recommended by your caregiver.  Eat plenty of calcium and folate. Foods that are rich in calcium include milk, cheese, and broccoli. Foods that are rich in folate include chickpeas, kidney beans, and spinach. HOME CARE INSTRUCTIONS Keep all follow-up appointments as directed by your caregiver. You may need periodic exams to check for polyps. SEEK MEDICAL CARE IF: You notice bleeding during a bowel movement.   This information is not intended to replace advice given to you by your health care provider. Make sure you discuss any questions you have with your health care provider.

## 2015-08-20 NOTE — H&P (Signed)
Samantha Reynolds is an 50 y.o. female.   Chief Complaint: Patient is here for colonoscopy. HPI: Patient is 50 year old Caucasian female who is here for screening colonoscopy. She denies abdominal pain change in bowel habits or rectal bleeding. Family history is negative for CRC.  Past Medical History  Diagnosis Date  . 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 H/O Hashimoto thyroiditis   . Endometrial thickening on ultra sound   . Hypothyroidism   . Endometrial polyp   . GERD (gastroesophageal reflux disease)     Past Surgical History  Procedure Laterality Date  . Thyroid lobectomy  11/24/2011    Procedure: THYROID LOBECTOMY;  Surgeon: Earnstine Regal, MD;  Location: [facility];  Service: General;  Laterality: Left;  Left Thyroid Lobectomy  . D & c hysterooscopy w/ thermachoice endometrial ablation  08-13-2004  . Reduction incarcerated uterus  06-20-2000    intrauterine preg. 13 wks /  urinary retention  . Tubal ligation  2002  . Laparoscopic cholecystectomy  04/1996  . Hysteroscopy w/d&c N/A 04/30/2015    Procedure: DILATATION AND CURETTAGE / INTENDED HYSTEROSCOPY;  Surgeon: Dian Queen, MD;  Location: [facility];  Service: Gynecology;  Laterality: N/A;    Family History  Problem Relation Age of Onset  . Cancer Maternal Aunt     spine/back  . Cancer Paternal Grandfather     lung   Social History:  reports that she quit smoking about 23 years ago. Her smoking use included Cigarettes. She has a 2.5 pack-year smoking history. She has never used smokeless tobacco. She reports that she does not drink alcohol or use illicit drugs.  Allergies:  Allergies  Allergen Reactions  . Demerol [Meperidine] Itching    All over the body.  . Penicillins Hives     childhood, does not remember if it spread all over the body or not. Has patient had a PCN reaction causing immediate  rash, facial/tongue/throat swelling, SOB or lightheadedness with hypotension: Nounknown Has patient had a PCN reaction causing severe rash involving mucus membranes or skin necrosis: Nounknown Has patient had a PCN reaction that required hospitalization Nounknown Has patient had a PCN reaction occurring within the last 10 years: {Yes/No:30  . Other Other (See Comments)    COCONUT-HIVES     Medications Prior to Admission  Medication Sig Dispense Refill  . calcium carbonate (TUMS - DOSED IN MG ELEMENTAL CALCIUM) 500 MG chewable tablet Chew 1 tablet by mouth as needed for indigestion or heartburn.    Marland Kitchen ibuprofen (ADVIL,MOTRIN) 200 MG tablet Take 400 mg by mouth every 6 (six) hours as needed.    [place] Gunning BOWEL PREP SOLN Take 1 kit by mouth once. 1 Bottle 0  . thyroid (ARMOUR) 90 MG tablet Take 90 mg by mouth every morning.     . vitamin B-12 (CYANOCOBALAMIN) 1000 MCG tablet Take 1,000 mcg by mouth daily.       No results found for this or any previous visit (from the past 48 hour(s)). No results found.  ROS  Blood pressure 127/89, pulse 109, temperature 97.8 F (36.6 C), temperature source Oral, resp. rate 19, height 5' 7.5" (1.715 m), weight 195 lb (88.451 kg), SpO2 99 %. Physical Exam  Constitutional: She appears well-developed and well-nourished.  HENT:  Mouth/Throat: Oropharynx is clear and moist.  Eyes: Conjunctivae are normal. No scleral icterus.  Neck: No thyromegaly present.  Cardiovascular: Normal rate, regular rhythm and normal heart sounds.   No murmur heard. Respiratory: Effort normal and breath sounds normal.  GI: Soft. She exhibits no distension and no mass. There is no tenderness.  Musculoskeletal: She exhibits no edema.  Lymphadenopathy:    She has no cervical adenopathy.  Neurological: She is alert.  Skin: Skin is warm and dry.     Assessment/Plan Average risk screening colonoscopy.  Rogene Houston, MD 08/20/2015, 7:34 AM

## 2015-08-25 ENCOUNTER — Encounter (HOSPITAL_COMMUNITY): Payer: Self-pay | Admitting: Internal Medicine

## 2015-10-08 ENCOUNTER — Telehealth: Payer: Self-pay | Admitting: Family Medicine

## 2015-10-08 DIAGNOSIS — Z139 Encounter for screening, unspecified: Secondary | ICD-10-CM

## 2015-10-08 DIAGNOSIS — Z13220 Encounter for screening for lipoid disorders: Secondary | ICD-10-CM

## 2015-10-08 NOTE — Telephone Encounter (Signed)
Last labs done at hospital on 04/22/15 cbc and met 7

## 2015-10-08 NOTE — Telephone Encounter (Signed)
Pt's specialist does tsh. Orders ready pt notified.

## 2015-10-08 NOTE — Telephone Encounter (Signed)
Patient is requesting lab work for physical appointment Aug.14.

## 2015-10-08 NOTE — Telephone Encounter (Signed)
I recommend lipid, liver, metabolic 7, CBC-check with patient I would recommend TSH and less she has a doctor who is following this

## 2015-10-12 MED FILL — ARMOUR THYROID 90 MG TABLET: 90 | 90 days supply | Qty: 90 | Fill #2

## 2015-11-03 DIAGNOSIS — Z13220 Encounter for screening for lipoid disorders: Secondary | ICD-10-CM | POA: Diagnosis not present

## 2015-11-03 DIAGNOSIS — Z139 Encounter for screening, unspecified: Secondary | ICD-10-CM | POA: Diagnosis not present

## 2015-11-04 LAB — CBC WITH DIFFERENTIAL/PLATELET
BASOS: 0 %
Basophils Absolute: 0 10*3/uL (ref 0.0–0.2)
EOS (ABSOLUTE): 0.1 10*3/uL (ref 0.0–0.4)
EOS: 1 %
HEMATOCRIT: 39.7 % (ref 34.0–46.6)
Hemoglobin: 13.5 g/dL (ref 11.1–15.9)
IMMATURE GRANS (ABS): 0 10*3/uL (ref 0.0–0.1)
IMMATURE GRANULOCYTES: 0 %
LYMPHS: 30 %
Lymphocytes Absolute: 1.4 10*3/uL (ref 0.7–3.1)
MCH: 32.5 pg (ref 26.6–33.0)
MCHC: 34 g/dL (ref 31.5–35.7)
MCV: 95 fL (ref 79–97)
MONOS ABS: 0.3 10*3/uL (ref 0.1–0.9)
Monocytes: 6 %
NEUTROS ABS: 3 10*3/uL (ref 1.4–7.0)
NEUTROS PCT: 63 %
PLATELETS: 165 10*3/uL (ref 150–379)
RBC: 4.16 x10E6/uL (ref 3.77–5.28)
RDW: 13.2 % (ref 12.3–15.4)
WBC: 4.7 10*3/uL (ref 3.4–10.8)

## 2015-11-04 LAB — HEPATIC FUNCTION PANEL
ALBUMIN: 4.5 g/dL (ref 3.5–5.5)
ALK PHOS: 76 IU/L (ref 39–117)
ALT: 22 IU/L (ref 0–32)
AST: 19 IU/L (ref 0–40)
BILIRUBIN TOTAL: 2.1 mg/dL — AB (ref 0.0–1.2)
Bilirubin, Direct: 0.28 mg/dL (ref 0.00–0.40)
TOTAL PROTEIN: 6.9 g/dL (ref 6.0–8.5)

## 2015-11-04 LAB — BASIC METABOLIC PANEL
BUN/Creatinine Ratio: 12 (ref 9–23)
BUN: 10 mg/dL (ref 6–24)
CO2: 20 mmol/L (ref 18–29)
CREATININE: 0.84 mg/dL (ref 0.57–1.00)
Calcium: 9.1 mg/dL (ref 8.7–10.2)
Chloride: 101 mmol/L (ref 96–106)
GFR, EST AFRICAN AMERICAN: 93 mL/min/{1.73_m2} (ref >59)
GFR, EST NON AFRICAN AMERICAN: 81 mL/min/{1.73_m2} (ref >59)
Glucose: 90 mg/dL (ref 65–99)
POTASSIUM: 4.1 mmol/L (ref 3.5–5.2)
SODIUM: 140 mmol/L (ref 134–144)

## 2015-11-04 LAB — LIPID PANEL
CHOLESTEROL TOTAL: 189 mg/dL (ref 100–199)
Chol/HDL Ratio: 5.1 ratio units — ABNORMAL HIGH (ref 0.0–4.4)
HDL: 37 mg/dL — ABNORMAL LOW (ref >39)
LDL Calculated: 116 mg/dL — ABNORMAL HIGH (ref 0–99)
Triglycerides: 179 mg/dL — ABNORMAL HIGH (ref 0–149)
VLDL CHOLESTEROL CAL: 36 mg/dL (ref 5–40)

## 2015-11-09 ENCOUNTER — Encounter: Payer: Self-pay | Admitting: Family Medicine

## 2015-11-09 ENCOUNTER — Ambulatory Visit (INDEPENDENT_AMBULATORY_CARE_PROVIDER_SITE_OTHER): Payer: 59 | Admitting: Family Medicine

## 2015-11-09 DIAGNOSIS — E785 Hyperlipidemia, unspecified: Secondary | ICD-10-CM

## 2015-11-09 HISTORY — DX: Gilbert syndrome: E80.4

## 2015-11-09 HISTORY — DX: Hyperlipidemia, unspecified: E78.5

## 2015-11-09 MED ORDER — DICLOFENAC SODIUM 75 MG PO TBEC: 75.0000 mg | DELAYED_RELEASE_TABLET | Freq: Two times a day (BID) | ORAL | 0 refills | Status: DC

## 2015-11-09 MED FILL — DICLOFENAC SOD EC 75 MG TAB: 75 | 15 days supply | Qty: 30 | Fill #0

## 2015-11-09 NOTE — Progress Notes (Signed)
   Subjective:    Patient ID: Samantha Reynolds, female    DOB: 12/05/1965, 50 y.o.   MRN: [id number]  HPI The patient comes in today for a wellness visit. Pt has physicals done yearly at gyn.     A review of their health history was completed.  A review of medications was also completed.  Any needed refills; none  Eating habits: health conscious  Falls/  MVA accidents in past few months: none  Regular exercise:   Specialist pt sees on regular basis: Dr. Ronnald Collum for thyroid. Has physicals done at gyn  Preventative health issues were discussed.   Additional concerns: none Patient does have family history of gilbert syndrome Review of Systems  Constitutional: Negative for activity change, appetite change and fatigue.  HENT: Negative for congestion.   Respiratory: Negative for cough.   Cardiovascular: Negative for chest pain.  Gastrointestinal: Negative for abdominal pain.  Endocrine: Negative for polydipsia and polyphagia.  Neurological: Negative for weakness.  Psychiatric/Behavioral: Negative for confusion.       Objective:   Physical Exam  Constitutional: She appears well-nourished. No distress.  Cardiovascular: Normal rate, regular rhythm and normal heart sounds.   No murmur heard. Pulmonary/Chest: Effort normal and breath sounds normal. No respiratory distress.  Musculoskeletal: She exhibits no edema.  Lymphadenopathy:    She has no cervical adenopathy.  Neurological: She is alert. She exhibits normal muscle tone.  Psychiatric: Her behavior is normal.  Vitals reviewed.         Assessment & Plan:  1. Gilbert syndrome Bilirubin stable if patient ever has jaundice follow-up  2. Hyperlipidemia Low fat diet follow-up within 2 years

## 2015-12-28 MED FILL — ARMOUR THYROID 90 MG TABLET: 90 | 90 days supply | Qty: 90 | Fill #3

## 2015-12-29 ENCOUNTER — Telehealth: Payer: Self-pay | Admitting: Obstetrics and Gynecology

## 2015-12-29 MED ORDER — MEGESTROL ACETATE 40 MG PO TABS: 40.0000 mg | ORAL_TABLET | Freq: Three times a day (TID) | ORAL | 0 refills | Status: DC

## 2015-12-29 MED FILL — MEGESTROL 40 MG TABLET: 40 | 15 days supply | Qty: 45 | Fill #0

## 2015-12-29 NOTE — Telephone Encounter (Signed)
Discussed pt's pending travel, : she requests attempted hormone manipulation of time of menses, to avoid menses during travel. Hx reviewed,no contraindication to progestins. Will Rx Megace 40 mg tid til spotting stops then q day til end of trip.

## 2016-02-01 ENCOUNTER — Other Ambulatory Visit (HOSPITAL_COMMUNITY): Payer: Self-pay | Admitting: Obstetrics and Gynecology

## 2016-02-01 DIAGNOSIS — Z1231 Encounter for screening mammogram for malignant neoplasm of breast: Secondary | ICD-10-CM

## 2016-02-29 ENCOUNTER — Ambulatory Visit (HOSPITAL_COMMUNITY)
Admission: RE | Admit: 2016-02-29 | Discharge: 2016-02-29 | Disposition: A | Discharge status: Home / Self Care | Payer: 59 | Source: Ambulatory Visit | Attending: Obstetrics and Gynecology | Admitting: Obstetrics and Gynecology

## 2016-02-29 DIAGNOSIS — Z1231 Encounter for screening mammogram for malignant neoplasm of breast: Secondary | ICD-10-CM | POA: Diagnosis not present

## 2016-03-01 DIAGNOSIS — C73 Malignant neoplasm of thyroid gland: Secondary | ICD-10-CM | POA: Diagnosis not present

## 2016-03-01 DIAGNOSIS — E063 Autoimmune thyroiditis: Secondary | ICD-10-CM | POA: Diagnosis not present

## 2016-03-08 DIAGNOSIS — D511 Vitamin B12 deficiency anemia due to selective vitamin B12 malabsorption with proteinuria: Secondary | ICD-10-CM | POA: Diagnosis not present

## 2016-03-08 DIAGNOSIS — C73 Malignant neoplasm of thyroid gland: Secondary | ICD-10-CM | POA: Diagnosis not present

## 2016-03-08 DIAGNOSIS — E063 Autoimmune thyroiditis: Secondary | ICD-10-CM | POA: Diagnosis not present

## 2016-03-08 DIAGNOSIS — E011 Iodine-deficiency related multinodular (endemic) goiter: Secondary | ICD-10-CM | POA: Diagnosis not present

## 2016-03-08 DIAGNOSIS — E039 Hypothyroidism, unspecified: Secondary | ICD-10-CM | POA: Diagnosis not present

## 2016-03-08 MED FILL — ARMOUR THYROID 60 MG TABLET: 60 | 30 days supply | Qty: 30 | Fill #0

## 2016-04-29 DIAGNOSIS — Z01419 Encounter for gynecological examination (general) (routine) without abnormal findings: Secondary | ICD-10-CM | POA: Diagnosis not present

## 2016-04-29 DIAGNOSIS — Z6831 Body mass index (BMI) 31.0-31.9, adult: Secondary | ICD-10-CM | POA: Diagnosis not present

## 2016-04-29 DIAGNOSIS — N951 Menopausal and female climacteric states: Secondary | ICD-10-CM | POA: Diagnosis not present

## 2016-04-29 MED FILL — TEMAZEPAM 30 MG CAPSULE: 30 | 30 days supply | Qty: 30 | Fill #0

## 2016-04-29 MED FILL — ALPRAZolam 0.5 MG TABS: 0.5 | 15 days supply | Qty: 60 | Fill #0

## 2016-04-29 MED FILL — PROGESTERONE 100 MG CAPSULE: 100 | 30 days supply | Qty: 30 | Fill #0

## 2016-05-17 MED FILL — ARMOUR THYROID 90 MG TABLET: 90 | 90 days supply | Qty: 90 | Fill #0

## 2016-05-27 MED FILL — PROGESTERONE 100 MG CAPSULE: 100 | 30 days supply | Qty: 30 | Fill #1

## 2016-06-29 ENCOUNTER — Ambulatory Visit (INDEPENDENT_AMBULATORY_CARE_PROVIDER_SITE_OTHER): Payer: 59 | Admitting: Family Medicine

## 2016-06-29 ENCOUNTER — Encounter: Payer: Self-pay | Admitting: Family Medicine

## 2016-06-29 VITALS — BP 118/72 | Temp 98.0°F | Ht 67.0 in | Wt 193.4 lb

## 2016-06-29 DIAGNOSIS — J029 Acute pharyngitis, unspecified: Secondary | ICD-10-CM

## 2016-06-29 DIAGNOSIS — A084 Viral intestinal infection, unspecified: Secondary | ICD-10-CM | POA: Diagnosis not present

## 2016-06-29 LAB — POCT RAPID STREP A (OFFICE): RAPID STREP A SCREEN: NEGATIVE

## 2016-06-29 NOTE — Progress Notes (Signed)
   Subjective:    Patient ID: Samantha Reynolds, female    DOB: 21-Mar-1966, 51 y.o.   MRN: [id number]  Sore Throat   This is a new problem. The current episode started in the past 7 days. Associated symptoms include ear pain and headaches. Pertinent negatives include no congestion, coughing or shortness of breath. Associated symptoms comments: Vomiting and diarrhea. She has tried acetaminophen for the symptoms.   Started over the weekend with the soreness on the left posterior part of her throat she felt like there was a sore there and now there seems to be several sores ear but none on the right side there is no fevers so she with no sweats chills or vomiting. She did over the weekend have an intestinal bug including vomiting and diarrhea but that is now gone   Review of Systems  Constitutional: Negative for activity change and fever.  HENT: Positive for ear pain. Negative for congestion and rhinorrhea.   Eyes: Negative for discharge.  Respiratory: Negative for cough, shortness of breath and wheezing.   Cardiovascular: Negative for chest pain.  Neurological: Positive for headaches.       Objective:   Physical Exam  Constitutional: She appears well-developed.  HENT:  Head: Normocephalic.  Nose: Nose normal.  Mouth/Throat: Oropharynx is clear and moist. No oropharyngeal exudate.  Neck: Neck supple.  Cardiovascular: Normal rate and normal heart sounds.   No murmur heard. Pulmonary/Chest: Effort normal and breath sounds normal. She has no wheezes.  Lymphadenopathy:    She has no cervical adenopathy.  Skin: Skin is warm and dry.  Nursing note and vitals reviewed.         Assessment & Plan:  Intestinal gastroenteritis viral resolving no need to do any other intervention  Aphthous ulcers in the back of the throat strep test negative should gradually get better over the next 5 days if not getting better or getting worse may need Valtrex or short course prednisone patient give [name]  update on Monday I do not feel this is a sign a cancer any type of growth does not need to see ENT currently

## 2016-06-30 LAB — STREP A DNA PROBE: STREP GP A DIRECT, DNA PROBE: NEGATIVE

## 2016-06-30 MED FILL — PROGESTERONE 100 MG CAPSULE: 100 | 30 days supply | Qty: 30 | Fill #2

## 2016-08-15 MED FILL — PROGESTERONE 100 MG CAPSULE: 100 | 30 days supply | Qty: 30 | Fill #3

## 2016-08-15 MED FILL — ARMOUR THYROID 90 MG TABLET: 90 | 90 days supply | Qty: 90 | Fill #1

## 2016-10-03 MED FILL — PROGESTERONE 100 MG CAPSULE: 100 | 30 days supply | Qty: 30 | Fill #4

## 2016-10-03 MED FILL — ALPRAZolam 0.5 MG TABS: 0.5 | 15 days supply | Qty: 60 | Fill #1

## 2016-11-10 MED FILL — ARMOUR THYROID 90 MG TABLET: 90 | 90 days supply | Qty: 90 | Fill #2

## 2016-11-22 DIAGNOSIS — H5213 Myopia, bilateral: Secondary | ICD-10-CM | POA: Diagnosis not present

## 2016-11-22 DIAGNOSIS — H524 Presbyopia: Secondary | ICD-10-CM | POA: Diagnosis not present

## 2016-12-19 MED FILL — PROGESTERONE 100 MG CAPSULE: 100 | 30 days supply | Qty: 30 | Fill #5

## 2017-01-25 ENCOUNTER — Other Ambulatory Visit (HOSPITAL_COMMUNITY): Payer: Self-pay | Admitting: Obstetrics and Gynecology

## 2017-01-25 DIAGNOSIS — Z1231 Encounter for screening mammogram for malignant neoplasm of breast: Secondary | ICD-10-CM

## 2017-01-27 MED FILL — ARMOUR THYROID 90 MG TABLET: 90 | 90 days supply | Qty: 90 | Fill #3

## 2017-01-27 MED FILL — ALPRAZolam 0.5 MG TABS: 0.5 | 15 days supply | Qty: 60 | Fill #0

## 2017-01-27 MED FILL — PROGESTERONE 100 MG CAPSULE: 100 | 30 days supply | Qty: 30 | Fill #6

## 2017-03-01 ENCOUNTER — Encounter (HOSPITAL_COMMUNITY): Payer: Self-pay

## 2017-03-01 ENCOUNTER — Ambulatory Visit (HOSPITAL_COMMUNITY)
Admission: RE | Admit: 2017-03-01 | Discharge: 2017-03-01 | Disposition: A | Discharge status: Home / Self Care | Payer: 59 | Source: Ambulatory Visit | Attending: Obstetrics and Gynecology | Admitting: Obstetrics and Gynecology

## 2017-03-01 DIAGNOSIS — Z1231 Encounter for screening mammogram for malignant neoplasm of breast: Secondary | ICD-10-CM | POA: Insufficient documentation

## 2017-03-09 DIAGNOSIS — E890 Postprocedural hypothyroidism: Secondary | ICD-10-CM | POA: Diagnosis not present

## 2017-03-09 DIAGNOSIS — E063 Autoimmune thyroiditis: Secondary | ICD-10-CM | POA: Diagnosis not present

## 2017-03-15 DIAGNOSIS — D511 Vitamin B12 deficiency anemia due to selective vitamin B12 malabsorption with proteinuria: Secondary | ICD-10-CM | POA: Diagnosis not present

## 2017-03-15 DIAGNOSIS — E890 Postprocedural hypothyroidism: Secondary | ICD-10-CM | POA: Diagnosis not present

## 2017-03-15 DIAGNOSIS — E011 Iodine-deficiency related multinodular (endemic) goiter: Secondary | ICD-10-CM | POA: Diagnosis not present

## 2017-03-15 DIAGNOSIS — E063 Autoimmune thyroiditis: Secondary | ICD-10-CM | POA: Diagnosis not present

## 2017-03-15 DIAGNOSIS — C73 Malignant neoplasm of thyroid gland: Secondary | ICD-10-CM | POA: Diagnosis not present

## 2017-04-05 ENCOUNTER — Telehealth (INDEPENDENT_AMBULATORY_CARE_PROVIDER_SITE_OTHER): Payer: Self-pay | Admitting: *Deleted

## 2017-04-05 MED FILL — HEMMOREX-HC 25 MG SUPP: 25 | 14 days supply | Qty: 14 | Fill #0

## 2017-04-05 MED FILL — DICYCLOMINE 10 MG CAPSULE: 10 | 30 days supply | Qty: 90 | Fill #0

## 2017-04-05 NOTE — Telephone Encounter (Signed)
Per Dr.Rehman - May call in Dicyclomine 10 mg - take 1 in the morning prior to meal , the 2 nd and 3 rd dose patient will take as needed.#90 with 5 refills. Anusol Suppository - 1 per rectum for 2 weeks @14  with 1 refill. Patient was also ask to keep a stool diary.  The above prescriptions were called to [facility] , patient's husband to pick up the medication.  Patient is aware.

## 2017-04-05 NOTE — Telephone Encounter (Signed)
Patient states that she is can't remember exactly when but knows that it was before Christmas , she started seeing blood after bowel movements. She says that it was a pinky color and it had a gathered look to it. She has had Gallbladder Surgery , so her BM's are not normal . She goes every morning and ifs not formed but is soft. Depending on what she eats during the day will depend on how many more times a day she will go to the bathroom. She sees blood then and she has seen it in the stool. On yesterday , she saw a clot,purple in color, and this has concerned her. She experiences a burning sensation in her abdomen afterwards.  Patient had TCS on 08/20/2015 , 1 small polyp removed and this was a tubular adenoma , also external hemorrhoids were noted.

## 2017-04-10 MED FILL — PROGESTERONE 100 MG CAPSULE: 100 | 30 days supply | Qty: 30 | Fill #7

## 2017-05-16 MED FILL — ARMOUR THYROID 90 MG TABLET: 90 | 90 days supply | Qty: 90 | Fill #0

## 2017-06-01 DIAGNOSIS — Z6831 Body mass index (BMI) 31.0-31.9, adult: Secondary | ICD-10-CM | POA: Diagnosis not present

## 2017-06-01 DIAGNOSIS — Z01419 Encounter for gynecological examination (general) (routine) without abnormal findings: Secondary | ICD-10-CM | POA: Diagnosis not present

## 2017-06-01 MED FILL — traZODone HCL 50 MG TABS: 50 | 30 days supply | Qty: 30 | Fill #0

## 2017-06-01 MED FILL — PROGESTERONE 100 MG CAPSULE: 100 | 90 days supply | Qty: 90 | Fill #0

## 2017-06-01 MED FILL — ALPRAZolam 0.5 MG TABS: 0.5 | 15 days supply | Qty: 60 | Fill #0

## 2017-08-01 MED FILL — ARMOUR THYROID 90 MG TABLET: 90 | 90 days supply | Qty: 90 | Fill #1

## 2017-08-03 ENCOUNTER — Other Ambulatory Visit (HOSPITAL_COMMUNITY)
Admission: RE | Admit: 2017-08-03 | Discharge: 2017-08-03 | Disposition: A | Discharge status: Home / Self Care | Payer: 59 | Source: Ambulatory Visit | Attending: Internal Medicine | Admitting: Internal Medicine

## 2017-08-03 ENCOUNTER — Other Ambulatory Visit (INDEPENDENT_AMBULATORY_CARE_PROVIDER_SITE_OTHER): Payer: Self-pay | Admitting: Internal Medicine

## 2017-08-03 DIAGNOSIS — K625 Hemorrhage of anus and rectum: Secondary | ICD-10-CM | POA: Insufficient documentation

## 2017-08-03 LAB — CBC
HCT: 38.8 % (ref 36.0–46.0)
HEMOGLOBIN: 13.7 g/dL (ref 12.0–15.0)
MCH: 33.2 pg (ref 26.0–34.0)
MCHC: 35.3 g/dL (ref 30.0–36.0)
MCV: 93.9 fL (ref 78.0–100.0)
Platelets: 188 10*3/uL (ref 150–400)
RBC: 4.13 MIL/uL (ref 3.87–5.11)
RDW: 12.9 % (ref 11.5–15.5)
WBC: 6.3 10*3/uL (ref 4.0–10.5)

## 2017-09-27 MED FILL — ALPRAZolam 0.5 MG TABS: 0.5 | 15 days supply | Qty: 60 | Fill #1

## 2017-09-27 MED FILL — traZODone HCL 50 MG TABS: 50 | 30 days supply | Qty: 30 | Fill #1

## 2017-09-29 ENCOUNTER — Other Ambulatory Visit (HOSPITAL_COMMUNITY): Payer: Self-pay | Admitting: Obstetrics and Gynecology

## 2017-09-29 DIAGNOSIS — Z1231 Encounter for screening mammogram for malignant neoplasm of breast: Secondary | ICD-10-CM

## 2017-10-19 ENCOUNTER — Encounter: Payer: Self-pay | Admitting: Nutrition

## 2017-10-19 ENCOUNTER — Encounter: Payer: 59 | Attending: Family Medicine | Admitting: Nutrition

## 2017-10-19 VITALS — Ht 67.0 in | Wt 193.4 lb

## 2017-10-19 DIAGNOSIS — Z6830 Body mass index (BMI) 30.0-30.9, adult: Secondary | ICD-10-CM | POA: Insufficient documentation

## 2017-10-19 DIAGNOSIS — E669 Obesity, unspecified: Secondary | ICD-10-CM | POA: Insufficient documentation

## 2017-10-19 DIAGNOSIS — E782 Mixed hyperlipidemia: Secondary | ICD-10-CM | POA: Insufficient documentation

## 2017-10-19 DIAGNOSIS — Z713 Dietary counseling and surveillance: Secondary | ICD-10-CM | POA: Insufficient documentation

## 2017-10-19 NOTE — Progress Notes (Signed)
  Medical Nutrition Therapy:  Appt start time: 1100 end time:  1130.   Assessment:  Primary concerns today: First Visit  Overweight and elevated TG, LDL. She would like to lose some weight. PMH: Hypothyroidism. Doesn't like to eat breakfast. Sometimes skips lunch. Doesn't get hungry. Eats more at night and snacks at night after supper. Drinks sodas and sweet tea. Working on cutting out soda and reducing sweet tea.  Would like to lose 10 lbs. Use to walk on treadmill during work. [place] to make changes with her diet and meal planing to reduce TG and LDL and lose 10 lbs.  Preferred Learning Style:   No preference indicated   Learning Readiness:   Ready  Change in progress   MEDICATIONS: see list   DIETARY INTAKE:   Eats 2-3 meals per day. Eats out 2-3 times per week.   Usual physical activity: Walks  Estimated energy needs: 1500  calories 170 g carbohydrates 112 g protein 42 g fat  Progress Towards Goal(s):  In progress.   Nutritional Diagnosis:  [place]-3.3 Overweight/obesity As related to Excess calorie intake.  As evidenced by BMI > 27.    Intervention:  Nutrition and heart healthy education provided on My Plate, CHO counting, meal planning, portion sizes, timing of meals, avoiding snacks between meals  as prescribed, benefits of exercising 30 minutes per day Ways to lower TG and increasing HIgh Fiber foods.   Teaching Method Utilized:  Visual Auditory Hands on  Handouts given during visit include:  The Plate Method  Lowering TG  Cardiac TLC Handout   Barriers to learning/adherence to lifestyle change: none  Demonstrated degree of understanding via:  Teach Back   Monitoring/Evaluation:  Dietary intake, exercise, meal planning, and body weight in 1 day(s).

## 2017-10-19 NOTE — Patient Instructions (Signed)
Goals 1. Eat meals on time 2. Don't skip meals 3. Cut down on chips, snack foods 4. Increase fresh fruits and vegetable and high fiber foods Drjink only water Lose 1 lbs per week

## 2017-10-25 ENCOUNTER — Encounter: Payer: 59 | Attending: Family Medicine | Admitting: Nutrition

## 2017-10-25 ENCOUNTER — Encounter: Payer: Self-pay | Admitting: Nutrition

## 2017-10-25 VITALS — Ht 67.0 in | Wt 194.0 lb

## 2017-10-25 DIAGNOSIS — Z713 Dietary counseling and surveillance: Secondary | ICD-10-CM | POA: Diagnosis not present

## 2017-10-25 DIAGNOSIS — E669 Obesity, unspecified: Secondary | ICD-10-CM

## 2017-10-25 DIAGNOSIS — E782 Mixed hyperlipidemia: Secondary | ICD-10-CM

## 2017-10-25 NOTE — Progress Notes (Signed)
  Medical Nutrition Therapy:  Appt start time: 1315end time:  8550  Assessment:  Primary concerns today: First Visit  Overweight and elevated TG, LDL.  Drinking water now and cut out tea and soda.  Has been cutting back on heavier carb foods.  Trying new fresher foods. Trying to eat more baked and broiled foods. Walking once a week for 15-30 minutes.    Wt up 1 lb  Preferred Learning Style:   No preference indicated   Learning Readiness:   Ready  Change in progress   MEDICATIONS: see list   DIETARY INTAKE:   Eats 2-3 meals per day. Eats out 2-3 times per week.   Usual physical activity: Walks  Estimated energy needs: 1500  calories 170 g carbohydrates 112 g protein 42 g fat  Progress Towards Goal(s):  In progress.   Nutritional Diagnosis:  [place]-3.3 Overweight/obesity As related to Excess calorie intake.  As evidenced by BMI > 27.    Intervention:  Nutrition and heart healthy education provided on My Plate, CHO counting, meal planning, portion sizes, timing of meals, avoiding snacks between meals  as prescribed, benefits of exercising 30 minutes per day Ways to lower TG and increasing HIgh Fiber foods.   Teaching Method Utilized:  Visual Auditory Hands on  Handouts given during visit include:  The Plate Method  Lowering TG  Cardiac TLC Handout   Barriers to learning/adherence to lifestyle change: none  Demonstrated degree of understanding via:  Teach Back   Monitoring/Evaluation:  Dietary intake, exercise, meal planning, and body weight in 1 month(s).

## 2017-10-25 NOTE — Patient Instructions (Addendum)
Goals 1.  Increase exercise to 30 minutes twice a week. 2. Drink more water- 4  Lose 1-2 llb per week. Follow HIgh Fiber diet.

## 2017-11-09 MED FILL — ARMOUR THYROID 90 MG TABLET: 90 | 90 days supply | Qty: 90 | Fill #2

## 2017-11-15 ENCOUNTER — Encounter: Payer: 59 | Attending: Family Medicine | Admitting: Nutrition

## 2017-11-15 ENCOUNTER — Encounter: Payer: Self-pay | Admitting: Nutrition

## 2017-11-15 VITALS — Ht 67.0 in | Wt 196.0 lb

## 2017-11-15 DIAGNOSIS — Z6830 Body mass index (BMI) 30.0-30.9, adult: Secondary | ICD-10-CM | POA: Insufficient documentation

## 2017-11-15 DIAGNOSIS — E782 Mixed hyperlipidemia: Secondary | ICD-10-CM | POA: Insufficient documentation

## 2017-11-15 DIAGNOSIS — E669 Obesity, unspecified: Secondary | ICD-10-CM | POA: Insufficient documentation

## 2017-11-15 DIAGNOSIS — Z713 Dietary counseling and surveillance: Secondary | ICD-10-CM | POA: Insufficient documentation

## 2017-11-15 NOTE — Progress Notes (Signed)
  Medical Nutrition Therapy:  Appt start time: 1130 end time:  1200 Assessment:  Primary concerns today: Third  Visit  Overweight and elevated TG, LDL.  Gained 2 lbs Changes made: Drinking more water-- drinking 1 bottle per day now.  Still using crystal light for dinner. Has cut backt tea and soda.  Has been more aware of food choices and making more healthier food choices. Exercise: not exercising much.  Feel better over all though surprised she hasn't lost any weight.    Current calorie and exercise routine hasn't been enough of a deficient to lose weight yet. Lipid Panel     Component Value Date/Time   CHOL 189 11/03/2015 0841   TRIG 179 (H) 11/03/2015 0841   HDL 37 (L) 11/03/2015 0841   CHOLHDL 5.1 (H) 11/03/2015 0841   LDLCALC 116 (H) 11/03/2015 0841    Preferred Learning Style:   No preference indicated   Learning Readiness:   Ready  Change in progress   MEDICATIONS: see list   DIETARY INTAKE:  B) Yogurt or still skips or breakfast bar L) Apple , PB and yogurt, unsweet tea or water crystal light D) Chicken, sliced tomatoes, corn on cob,  Snack: wheat thins   Usual physical activity: Walks  Estimated energy needs: 1500  calories 170 g carbohydrates 112 g protein 42 g fat  Progress Towards Goal(s):  In progress.   Nutritional Diagnosis:  [place]-3.3 Overweight/obesity As related to Excess calorie intake.  As evidenced by BMI > 27.    Intervention:  Nutrition and heart healthy education provided on My Plate, CHO counting, meal planning, portion sizes, timing of meals, avoiding snacks between meals  as prescribed, benefits of exercising 30 minutes per day Ways to lower TG and increasing HIgh Fiber foods.  Goals 1. Start walking 15-20 minutes 3 times per week. 2. Continue to make better choces 3 Lose 2 lb per month Get TG less than 150 mg/dl. Teaching Method Utilized:  Visual Auditory Hands on  Handouts given during visit include:  The Plate  Method  Lowering TG  Cardiac TLC Handout   Barriers to learning/adherence to lifestyle change: none  Demonstrated degree of understanding via:  Teach Back   Monitoring/Evaluation:  Dietary intake, exercise, meal planning, and body weight PRN.

## 2017-11-15 NOTE — Patient Instructions (Addendum)
Goals 1. Start walking 15-20 minutes 3 times per week. 2. Continue to make better choces 3 Lose 2 lb per month Get TG less than 150 mg/dl.

## 2017-12-01 MED FILL — ALPRAZolam 0.5 MG TABS: 0.5 | 15 days supply | Qty: 60 | Fill #0

## 2017-12-01 MED FILL — PROGESTERONE 100 MG CAPSULE: 100 | 90 days supply | Qty: 90 | Fill #1

## 2018-01-02 ENCOUNTER — Ambulatory Visit (INDEPENDENT_AMBULATORY_CARE_PROVIDER_SITE_OTHER): Payer: 59 | Admitting: Internal Medicine

## 2018-01-02 ENCOUNTER — Encounter (INDEPENDENT_AMBULATORY_CARE_PROVIDER_SITE_OTHER): Payer: Self-pay | Admitting: Internal Medicine

## 2018-01-02 VITALS — BP 114/80 | HR 72 | Temp 98.2°F | Resp 18 | Ht 67.0 in | Wt 190.1 lb

## 2018-01-02 DIAGNOSIS — K921 Melena: Secondary | ICD-10-CM | POA: Diagnosis not present

## 2018-01-02 MED ORDER — HYDROCORTISONE ACETATE 25 MG RE SUPP: 25.0000 mg | Freq: Every day | RECTAL | 1 refills | Status: DC

## 2018-01-02 MED FILL — HYDROCORTISONE ACETATE 25 M: 25 | 14 days supply | Qty: 14 | Fill #0

## 2018-01-02 NOTE — Progress Notes (Signed)
Presenting complaint;  Hematochezia.  Subjective:  Patient is 52 year old Caucasian female who presents with 1 year history of hematochezia.  Her bowels have been irregular but she has never been constipated.  She was advised to take fiber supplement daily.  She feels her bowels are more bulky and soft but bleeding has not stopped.  She has used Preparation H once again without any benefit.  Lately she has had bleeding every day with her bowel movements.  She has noted perianal burning and occasionally mild pain at defecation.  In addition to gummy fibers she has been also taking probiotic. She has good appetite and her weight has been stable.  She takes ibuprofen for back pain.  She may take 10 to 15 pills/month.  She does not take it every day.  She has screening colonoscopy in February 2017 with removal of a small polyp which was a tubular adenoma and she had small external hemorrhoids.  It was decided to wait 10 years before next exam.  Family history is negative for inflammatory bowel disease.  Current Medications: Outpatient Encounter Medications as of 01/02/2018  Medication Sig  . ALPRAZolam (XANAX) 0.5 MG tablet Take 0.5 mg by mouth 2 (two) times daily as needed for anxiety.  . calcium carbonate (TUMS - DOSED IN MG ELEMENTAL CALCIUM) 500 MG chewable tablet Chew 1 tablet by mouth as needed for indigestion or heartburn.  Marland Kitchen ibuprofen (ADVIL,MOTRIN) 200 MG tablet Take 400 mg by mouth every 6 (six) hours as needed.  . Progesterone Micronized (PROGESTERONE PO) Take by mouth. At bedtime  . thyroid (ARMOUR) 90 MG tablet Take 90 mg by mouth every morning.   . [DISCONTINUED] diclofenac (VOLTAREN) 75 MG EC tablet Take 1 tablet (75 mg total) by mouth 2 (two) times daily. (Patient not taking: Reported on 06/29/2016)  . [DISCONTINUED] megestrol (MEGACE) 40 MG tablet Take 1 tablet (40 mg total) by mouth 3 (three) times daily. Til bleeding stops then once daily. (Patient not taking: Reported on 06/29/2016)   . [DISCONTINUED] vitamin B-12 (CYANOCOBALAMIN) 1000 MCG tablet Take 1,000 mcg by mouth daily.    No facility-administered encounter medications on file as of 01/02/2018.      Objective: Blood pressure 114/80, pulse 72, temperature 98.2 F (36.8 C), temperature source Oral, resp. rate 18, height 5\' 7"  (1.702 m), weight 190 lb 1.6 oz (86.2 kg), last menstrual period 02/16/2015. Patient is alert and in no acute distress. Conjunctiva is pink. Sclera is nonicteric Oropharyngeal mucosa is normal. No neck masses or thyromegaly noted. Cardiac exam with regular rhythm normal S1 and S2. No murmur or gallop noted. Lungs are clear to auscultation. Abdomen is symmetrical soft and nontender with organomegaly or masses. Rectal examination was limited to external inspection and no ulcer or other abnormalities noted. No LE edema or clubbing noted.    Assessment:  #1.  Chronic hematochezia.  She is noticing small amount of blood with her bowel movements almost daily for close to 12 months.  Her last colonoscopy was over 2-1/2 years ago.  Doubt hemorrhoidal bleeding given its frequency.  She could have chronic anal fissure or mild proctitis.  Plan:  She will continue fiber supplement at a dose of 3 g p.o. twice daily. Anusol HC suppository 1 per rectum daily at bedtime for 2 weeks. Patient will keep symptom diary as to bleeding episodes in the next 2 weeks and call with progress report. If she fails to respond to topical steroids would treat her with topical calcium channel blocker before  considering flexible sigmoidoscopy. Office visit on as-needed basis.

## 2018-01-02 NOTE — Patient Instructions (Signed)
Keep symptom diary as to frequency of rectal bleeding and bowel movements for the next 2 weeks while you are using suppository.

## 2018-01-16 ENCOUNTER — Encounter: Payer: Self-pay | Admitting: Family Medicine

## 2018-01-16 ENCOUNTER — Ambulatory Visit (INDEPENDENT_AMBULATORY_CARE_PROVIDER_SITE_OTHER): Payer: 59 | Admitting: Family Medicine

## 2018-01-16 VITALS — BP 130/80 | Ht 67.0 in | Wt 190.2 lb

## 2018-01-16 DIAGNOSIS — Z13220 Encounter for screening for lipoid disorders: Secondary | ICD-10-CM

## 2018-01-16 DIAGNOSIS — Z131 Encounter for screening for diabetes mellitus: Secondary | ICD-10-CM | POA: Diagnosis not present

## 2018-01-16 DIAGNOSIS — R21 Rash and other nonspecific skin eruption: Secondary | ICD-10-CM

## 2018-01-16 MED ORDER — TRIAMCINOLONE ACETONIDE 0.1 % EX CREA: TOPICAL_CREAM | CUTANEOUS | 4 refills | Status: DC

## 2018-01-16 MED FILL — TRIAMCINOLONE 0.1% CREAM: 0.1 | 7 days supply | Qty: 45 | Fill #0

## 2018-01-16 NOTE — Progress Notes (Signed)
   Subjective:    Patient ID: Samantha Reynolds, female    DOB: 11-14-1965, 52 y.o.   MRN: [id number]  HPI Patient arrives with a spot under her left arm pit that feels raw for a month. Patient stated that the place doesn't look raw or irritated but feels raw and irritated.  The area was first noticed on her left side underneath the arm is approximately three fourths of an inch by 3 inches long rough skin with some rawness around it and some discomfort denies any other symptoms no chest related symptoms no other skin lesions she is aware of Review of Systems  Constitutional: Negative for activity change and appetite change.  HENT: Negative for congestion and rhinorrhea.   Respiratory: Negative for cough and shortness of breath.   Cardiovascular: Negative for chest pain and leg swelling.  Gastrointestinal: Negative for abdominal pain, nausea and vomiting.  Skin: Negative for color change.  Neurological: Negative for dizziness and weakness.  Psychiatric/Behavioral: Negative for agitation and confusion.       Objective:   Physical Exam  Constitutional: She appears well-nourished. No distress.  HENT:  Head: Normocephalic and atraumatic.  Eyes: Right eye exhibits no discharge. Left eye exhibits no discharge.  Neck: No tracheal deviation present.  Cardiovascular: Normal rate, regular rhythm and normal heart sounds.  No murmur heard. Pulmonary/Chest: Effort normal and breath sounds normal. No respiratory distress.  Musculoskeletal: She exhibits no edema.  Lymphadenopathy:    She has no cervical adenopathy.  Neurological: She is alert. Coordination normal.  Skin: Skin is warm and dry.  Psychiatric: She has a normal mood and affect. Her behavior is normal.  Vitals reviewed.   Excoriated skin underneath the left arm as well as thickened skin underneath the right arm the right arm is not as much as the left we will go ahead and use steroid cream but referred to dermatology        Assessment & Plan:  Screening labs ordered Unusual rash with thickened skin recommend steroid cream recommend dermatology consult Patient does female health checkups with her gynecologist

## 2018-01-23 ENCOUNTER — Other Ambulatory Visit (INDEPENDENT_AMBULATORY_CARE_PROVIDER_SITE_OTHER): Payer: Self-pay | Admitting: Internal Medicine

## 2018-01-23 ENCOUNTER — Encounter (INDEPENDENT_AMBULATORY_CARE_PROVIDER_SITE_OTHER): Payer: Self-pay | Admitting: *Deleted

## 2018-01-23 ENCOUNTER — Telehealth (INDEPENDENT_AMBULATORY_CARE_PROVIDER_SITE_OTHER): Payer: Self-pay | Admitting: *Deleted

## 2018-01-23 DIAGNOSIS — K921 Melena: Secondary | ICD-10-CM

## 2018-01-23 NOTE — Telephone Encounter (Signed)
Flex sig sch'd 02/19/18, patient aware

## 2018-01-23 NOTE — Telephone Encounter (Signed)
Patient called the offie on 01/17/2018 and states that she finished the suppositories , she says that she is no worse , no better.  Per Dr.Rehman -arrange a Sigmoidoscopy with conscious sedation. Patient was called and made aware.

## 2018-01-24 DIAGNOSIS — K921 Melena: Secondary | ICD-10-CM | POA: Insufficient documentation

## 2018-02-01 DIAGNOSIS — L438 Other lichen planus: Secondary | ICD-10-CM | POA: Diagnosis not present

## 2018-02-07 DIAGNOSIS — Z131 Encounter for screening for diabetes mellitus: Secondary | ICD-10-CM | POA: Diagnosis not present

## 2018-02-07 DIAGNOSIS — Z13220 Encounter for screening for lipoid disorders: Secondary | ICD-10-CM | POA: Diagnosis not present

## 2018-02-07 MED FILL — ALPRAZolam 0.5 MG TABS: 0.5 | 15 days supply | Qty: 60 | Fill #1

## 2018-02-07 MED FILL — ARMOUR THYROID 90 MG TABLET: 90 | 90 days supply | Qty: 90 | Fill #3

## 2018-02-07 MED FILL — FLUTICASONE PROP 0.05% CRM: 0.05 | 30 days supply | Qty: 60 | Fill #0

## 2018-02-08 LAB — GLUCOSE, RANDOM: Glucose: 95 mg/dL (ref 65–99)

## 2018-02-08 LAB — LIPID PANEL
CHOL/HDL RATIO: 4.7 ratio — AB (ref 0.0–4.4)
Cholesterol, Total: 166 mg/dL (ref 100–199)
HDL: 35 mg/dL — AB (ref >39)
LDL Calculated: 98 mg/dL (ref 0–99)
Triglycerides: 165 mg/dL — ABNORMAL HIGH (ref 0–149)
VLDL CHOLESTEROL CAL: 33 mg/dL (ref 5–40)

## 2018-02-19 ENCOUNTER — Encounter (HOSPITAL_COMMUNITY)
Admission: RE | Disposition: A | Discharge status: Home / Self Care | Payer: Self-pay | Source: Ambulatory Visit | Attending: Internal Medicine

## 2018-02-19 ENCOUNTER — Ambulatory Visit (HOSPITAL_COMMUNITY)
Admission: RE | Admit: 2018-02-19 | Discharge: 2018-02-19 | Disposition: A | Discharge status: Home / Self Care | Payer: 59 | Source: Ambulatory Visit | Attending: Internal Medicine | Admitting: Internal Medicine

## 2018-02-19 ENCOUNTER — Other Ambulatory Visit: Payer: Self-pay

## 2018-02-19 ENCOUNTER — Encounter (HOSPITAL_COMMUNITY): Payer: Self-pay | Admitting: *Deleted

## 2018-02-19 DIAGNOSIS — C20 Malignant neoplasm of rectum: Secondary | ICD-10-CM

## 2018-02-19 DIAGNOSIS — Z79899 Other long term (current) drug therapy: Secondary | ICD-10-CM | POA: Insufficient documentation

## 2018-02-19 DIAGNOSIS — Z880 Allergy status to penicillin: Secondary | ICD-10-CM | POA: Insufficient documentation

## 2018-02-19 DIAGNOSIS — K921 Melena: Secondary | ICD-10-CM

## 2018-02-19 DIAGNOSIS — Z8042 Family history of malignant neoplasm of prostate: Secondary | ICD-10-CM | POA: Insufficient documentation

## 2018-02-19 DIAGNOSIS — E890 Postprocedural hypothyroidism: Secondary | ICD-10-CM | POA: Insufficient documentation

## 2018-02-19 DIAGNOSIS — K219 Gastro-esophageal reflux disease without esophagitis: Secondary | ICD-10-CM | POA: Diagnosis not present

## 2018-02-19 DIAGNOSIS — F419 Anxiety disorder, unspecified: Secondary | ICD-10-CM | POA: Insufficient documentation

## 2018-02-19 DIAGNOSIS — Z801 Family history of malignant neoplasm of trachea, bronchus and lung: Secondary | ICD-10-CM | POA: Diagnosis not present

## 2018-02-19 DIAGNOSIS — Z8249 Family history of ischemic heart disease and other diseases of the circulatory system: Secondary | ICD-10-CM | POA: Diagnosis not present

## 2018-02-19 DIAGNOSIS — K621 Rectal polyp: Secondary | ICD-10-CM

## 2018-02-19 DIAGNOSIS — Z85850 Personal history of malignant neoplasm of thyroid: Secondary | ICD-10-CM | POA: Insufficient documentation

## 2018-02-19 DIAGNOSIS — K6289 Other specified diseases of anus and rectum: Secondary | ICD-10-CM

## 2018-02-19 DIAGNOSIS — Z87891 Personal history of nicotine dependence: Secondary | ICD-10-CM | POA: Insufficient documentation

## 2018-02-19 DIAGNOSIS — Z885 Allergy status to narcotic agent status: Secondary | ICD-10-CM | POA: Diagnosis not present

## 2018-02-19 DIAGNOSIS — Z9049 Acquired absence of other specified parts of digestive tract: Secondary | ICD-10-CM | POA: Insufficient documentation

## 2018-02-19 DIAGNOSIS — E063 Autoimmune thyroiditis: Secondary | ICD-10-CM | POA: Diagnosis not present

## 2018-02-19 DIAGNOSIS — Z888 Allergy status to other drugs, medicaments and biological substances status: Secondary | ICD-10-CM | POA: Diagnosis not present

## 2018-02-19 HISTORY — PX: POLYPECTOMY: SHX5525

## 2018-02-19 HISTORY — DX: Malignant (primary) neoplasm, unspecified: C80.1

## 2018-02-19 HISTORY — PX: FLEXIBLE SIGMOIDOSCOPY: SHX5431

## 2018-02-19 HISTORY — PX: BIOPSY: SHX5522

## 2018-02-19 HISTORY — DX: Anxiety disorder, unspecified: F41.9

## 2018-02-19 LAB — CBC
HCT: 32.7 % — ABNORMAL LOW (ref 36.0–46.0)
HEMOGLOBIN: 11.1 g/dL — AB (ref 12.0–15.0)
MCH: 32.1 pg (ref 26.0–34.0)
MCHC: 33.9 g/dL (ref 30.0–36.0)
MCV: 94.5 fL (ref 80.0–100.0)
Platelets: 147 10*3/uL — ABNORMAL LOW (ref 150–400)
RBC: 3.46 MIL/uL — AB (ref 3.87–5.11)
RDW: 12.4 % (ref 11.5–15.5)
WBC: 4.5 10*3/uL (ref 4.0–10.5)
nRBC: 0 % (ref 0.0–0.2)

## 2018-02-19 SURGERY — SIGMOIDOSCOPY, FLEXIBLE
Anesthesia: Moderate Sedation

## 2018-02-19 MED ORDER — FENTANYL CITRATE (PF) 100 MCG/2ML IJ SOLN
INTRAMUSCULAR | Status: DC | PRN
  Administered 2018-02-19 (×4): 25 ug via INTRAVENOUS

## 2018-02-19 MED ORDER — FENTANYL CITRATE (PF) 100 MCG/2ML IJ SOLN
INTRAMUSCULAR | Status: AC
  Filled 2018-02-19: qty 2

## 2018-02-19 MED ORDER — STERILE WATER FOR IRRIGATION IR SOLN
Status: DC | PRN
  Administered 2018-02-19: 10:00:00

## 2018-02-19 MED ORDER — SODIUM CHLORIDE 0.9 % IV SOLN
INTRAVENOUS | Status: DC
  Administered 2018-02-19: 10:00:00 via INTRAVENOUS

## 2018-02-19 MED ORDER — MIDAZOLAM HCL 5 MG/5ML IJ SOLN
INTRAMUSCULAR | Status: DC | PRN
  Administered 2018-02-19 (×5): 2 mg via INTRAVENOUS

## 2018-02-19 MED ORDER — MIDAZOLAM HCL 5 MG/5ML IJ SOLN
INTRAMUSCULAR | Status: AC
  Filled 2018-02-19: qty 10

## 2018-02-19 NOTE — Progress Notes (Signed)
Work excuse note given to [employer], [facility] of Surgical services for [facility].

## 2018-02-19 NOTE — Discharge Instructions (Signed)
Resume usual medications as before. Resume usual diet. No driving for 24 hours. Physician will call with results of biopsy and blood test.       Flexible Sigmoidoscopy, Care After This sheet gives you information about how to care for yourself after your procedure. Your health care provider may also give you more specific instructions. If you have problems or questions, contact your health care provider. What can I expect after the procedure? After the procedure, it is common to have:  Abdominal cramping or pain.  Bloating.  A small amount of rectal bleeding if you had a biopsy.  Follow these instructions at home:  Take over-the-counter and prescription medicines only as told by your health care provider.  Do not drive for 24 hours if you received a medicine to help you relax (sedative).  Keep all follow-up visits as told by your health care provider. This is important. Contact a health care provider if:  You have abdominal pain or cramping that gets worse or is not helped with medicine.  You continue to have small amounts of rectal bleeding after 24 hours.  You have nausea or vomiting.  You feel weak or dizzy.  You have a fever. Get help right away if:  You pass large blood clots or see a large amount of blood in the toilet after having a bowel movement.  You have nausea or vomiting for more than 24 hours after the procedure. This information is not intended to replace advice given to you by your health care provider. Make sure you discuss any questions you have with your health care provider. Document Released: 03/19/2013 Document Revised: 10/02/2015 Document Reviewed: 06/13/2015 Elsevier Interactive Patient Education  [employer].

## 2018-02-19 NOTE — H&P (Signed)
Samantha Reynolds is an 52 y.o. female.   Chief Complaint: Patient is here for occipital sigmoidoscopy. HPI: Patient is 52 year old Caucasian female who presents with 1 year history of hematochezia.  She has been passing a small amount of blood with her bowel movements.  She was seen in the office 6 weeks ago.  She has not responded to topical therapy.  She had colonoscopy in May 2017 with removal of a small polyp from splenic flexure and was a tubular adenoma.  She also had small external hemorrhoids.  Lately she has noted rectal discomfort.  It is not intractable.  Her bowels generally been normal.  She has not experienced diarrhea or constipation.  Since her office visit she has lost 8 or 9 pounds.  She says her appetite has not been as good. Family history is negative for IBD or CRC.  Past Medical History:  Diagnosis Date  . Anxiety   . Cancer [facility]) 2013   thyroid  . Endometrial polyp   . Endometrial thickening on ultra sound   . GERD (gastroesophageal reflux disease)   . H/O Hashimoto thyroiditis   . 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 Hypothyroidism     Past Surgical History:  Procedure Laterality Date  . COLONOSCOPY N/A 08/20/2015   Procedure: COLONOSCOPY;  Surgeon: Rogene Houston, MD;  Location: [facility];  Service: Endoscopy;  Laterality: N/A;  730  . [place] ENDOMETRIAL ABLATION  08-13-2004  . HYSTEROSCOPY W/D&C N/A 04/30/2015   Procedure: DILATATION AND CURETTAGE / INTENDED HYSTEROSCOPY;  Surgeon: Dian Queen, MD;  Location: [place];  Service: Gynecology;  Laterality: N/A;  . LAPAROSCOPIC CHOLECYSTECTOMY  04/1996  . POLYPECTOMY  08/20/2015   Procedure: POLYPECTOMY;  Surgeon: Rogene Houston, MD;  Location: [facility];  Service: Endoscopy;;  Splenic flexure polypectomy  . REDUCTION INCARCERATED UTERUS  06-20-2000   intrauterine preg. 13 wks /  urinary retention  . THYROID LOBECTOMY  11/24/2011   Procedure: THYROID LOBECTOMY;  Surgeon: Earnstine Regal, MD;  Location: [facility];  Service: General;  Laterality: Left;  Left Thyroid Lobectomy  . TUBAL LIGATION  2002    Family History  Problem Relation Age of Onset  . Hypertension Mother   . Hypertension Father   . Prostate cancer Father   . Cancer Maternal Aunt        spine/back  . Cancer Paternal Grandfather        lung  . Healthy Son   . Healthy Son   . Healthy Son   . Colon cancer Neg Hx    Social History:  reports that she quit smoking about 26 years ago. Her smoking use included cigarettes. She has a 2.50 pack-year smoking history. She has never used smokeless tobacco. She reports that she does not drink alcohol or use drugs.  Allergies:  Allergies  Allergen Reactions  . Demerol [Meperidine] Itching    All over the body.  . Penicillins Hives     childhood, does not remember if it spread all over the body or not. Has patient had a PCN reaction causing immediate rash, facial/tongue/throat swelling, SOB or lightheadedness with hypotension: Nounknown Has patient had a PCN reaction causing severe rash involving mucus membranes or skin necrosis: Nounknown Has patient had a PCN reaction that required hospitalization Nounknown Has patient had a PCN reaction occurring within the  last 10 years: {Yes/No:30  . Other Other (See Comments)    COCONUT-HIVES     Medications Prior to Admission  Medication Sig Dispense Refill  . ALPRAZolam (XANAX) 0.5 MG tablet Take 0.5 mg by mouth 2 (two) times daily as needed for anxiety.    . calcium carbonate (TUMS - DOSED IN MG ELEMENTAL CALCIUM) 500 MG chewable tablet Chew 1 tablet by mouth 3 (three) times daily as needed for indigestion or heartburn.     Marland Kitchen FIBER ADULT GUMMIES PO Take 1 tablet by mouth 3 (three) times daily.    . fluticasone (CUTIVATE) 0.05 % cream Apply 1 application topically 2 (two) times daily as  needed. Skin irritation/rash  3  . ibuprofen (ADVIL,MOTRIN) 200 MG tablet Take 400 mg by mouth every 8 (eight) hours as needed (for pain.).     Marland Kitchen ketotifen (ALAWAY) 0.025 % ophthalmic solution Place 1 drop into both eyes daily as needed (for allergy eyes.).    Marland Kitchen Probiotic Product (CULTURELLE PROBIOTICS PO) Take 1 capsule by mouth at bedtime.    . progesterone (PROMETRIUM) 100 MG capsule Take 100 mg by mouth at bedtime.    Marland Kitchen thyroid (ARMOUR) 90 MG tablet Take 90 mg by mouth daily before breakfast.     . hydrocortisone (ANUSOL-HC) 25 MG suppository Place 1 suppository (25 mg total) rectally at bedtime. (Patient not taking: Reported on 02/12/2018) 14 suppository 1  . triamcinolone cream (KENALOG) 0.1 % Apply thin amount bid prn (Patient not taking: Reported on 02/12/2018) 45 g 4    No results found for this or any previous visit (from the past 48 hour(s)). No results found.  ROS  Blood pressure (!) 146/113, pulse (!) 108, temperature 98.3 F (36.8 C), temperature source Oral, resp. rate 14, height 5\' 7"  (1.702 m), weight 83 kg, last menstrual period 02/16/2015, SpO2 100 %. Physical Exam  Constitutional: She appears well-developed and well-nourished.  HENT:  Mouth/Throat: Oropharynx is clear and moist.  Eyes: Conjunctivae are normal. No scleral icterus.  Neck: No thyromegaly present.  Cardiovascular: Normal rate, regular rhythm and normal heart sounds.  No murmur heard. Respiratory: Effort normal and breath sounds normal.  GI: Soft. She exhibits no distension and no mass. There is no tenderness.  Musculoskeletal: She exhibits no edema.  Lymphadenopathy:    She has no cervical adenopathy.  Neurological: She is alert.  Skin: Skin is warm and dry.     Assessment/Plan Hematochezia. Diagnostic flexible sigmoidoscopy  Hildred Laser, MD 02/19/2018, 10:15 AM

## 2018-02-19 NOTE — Op Note (Signed)
[facility] Patient Name: Samantha Reynolds Procedure Date: 02/19/2018 10:08 AM MRN: [id number] Date of Birth: 11-Feb-1966 Attending MD: Hildred Laser , MD CSN: [id number] Age: 52 Admit Type: Outpatient Procedure:                Flexible Sigmoidoscopy Indications:              Hematochezia Providers:                Hildred Laser, MD, Jeanann Lewandowsky. Sharon Seller, RN, Nelma Rothman, Technician Referring MD:             Elayne Snare. Wolfgang Phoenix, MD Medicines:                Fentanyl 100 micrograms IV, Midazolam 10 mg IV Complications:            No immediate complications. Estimated Blood Loss:     Estimated blood loss was minimal. Estimated blood                            loss was minimal. Procedure:                Pre-Anesthesia Assessment:                           - Prior to the procedure, a History and Physical                            was performed, and patient medications and                            allergies were reviewed. The patient's tolerance of                            previous anesthesia was also reviewed. The risks                            and benefits of the procedure and the sedation                            options and risks were discussed with the patient.                            All questions were answered, and informed consent                            was obtained. Prior Anticoagulants: The patient has                            taken no previous anticoagulant or antiplatelet                            agents. ASA Grade Assessment: II - A patient with  mild systemic disease. After reviewing the risks                            and benefits, the patient was deemed in                            satisfactory condition to undergo the procedure.                           After obtaining informed consent, the scope was                            passed under direct vision. The PCF-[id number]                            ([id number])  scope was introduced through the anus and                            advanced to the the sigmoid colon. The flexible                            sigmoidoscopy was accomplished without difficulty.                            The patient tolerated the procedure well. The                            quality of the bowel preparation was excellent. Scope In: 10:29:04 AM Scope Out: 10:43:38 AM Total Procedure Duration: 0 hours 14 minutes 34 seconds  Findings:      The perianal examination was normal.      The digital rectal exam revealed a less than 1 cm (diameter) firm and       mobile rectal mass palpated 2 to 4 cm from the anal verge. The mass was       non-circumferential and located predominantly at the posterior bowel       wall.      The sigmoid colon appeared normal.      The rectum, proximal rectum and recto-sigmoid colon appeared normal.      A polypoid and ulcerated non-obstructing medium-sized mass was found in       the distal rectum. The mass was partially circumferential (involving       one-third of the lumen circumference). The mass measured three cm in       length. This was biopsied with a cold forceps for histology. The       pathology specimen was placed into Bottle Number 1.      A small polyp was found in the rectum. The polyp was sessile. The polyp       was removed with a cold snare. Resection and retrieval were complete.       The pathology specimen was placed into Bottle Number 2.      External and internal hemorrhoids were found during retroflexion. The       hemorrhoids were small. Impression:               - Rectal mass 2 to 4 cm from the  anal verge.                           - The sigmoid colon is normal.                           - The rectum, proximal rectum and recto-sigmoid                            colon are normal.                           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 Biopsied.                           - One small polyp in the rectum, removed  with a                            cold snare. Resected and retrieved. Moderate Sedation:      Moderate (conscious) sedation was administered by the endoscopy nurse       and supervised by the endoscopist. The following parameters were       monitored: oxygen saturation, heart rate, blood pressure, CO2       capnography and response to care. Total physician intraservice time was       21 minutes. Recommendation:           - Discharge patient to home (with spouse).                           - Resume previous diet today.                           - Continue present medications.                           - Await pathology results.                           - Check CBC and CEA today. Procedure Code(s):        --- Professional ---                           [phone], Sigmoidoscopy, flexible; with removal of                            tumor(s), polyp(s), or other lesion(s) by snare                            technique                           [id number], 59, Sigmoidoscopy, flexible; with biopsy,                            single or multiple  G0500, Moderate sedation services provided by the                            same physician or other qualified health care                            professional performing a gastrointestinal                            endoscopic service that sedation supports,                            requiring the presence of an independent trained                            observer to assist in the monitoring of the                            patient's level of consciousness and physiological                            status; initial 15 minutes of intra-service time;                            patient age 16 years or older (additional time may                            be reported with [phone], as appropriate) Diagnosis Code(s):        --- Professional ---                           K62.89, Other specified diseases of anus and rectum                            C20, Malignant neoplasm of rectum                           K62.1, Rectal polyp                           K92.1, Melena (includes Hematochezia) CPT copyright 2018 [employer]. All rights reserved. The codes documented in this report are preliminary and upon coder review may  be revised to meet current compliance requirements. Hildred Laser, MD Hildred Laser, MD 02/19/2018 11:11:51 AM This report has been signed electronically. Number of Addenda: 0

## 2018-02-19 NOTE — Progress Notes (Signed)
Samantha Reynolds cannot drive, operate heavy machinery , or sign legal documents until after noon on Tuesday November 26th,2019.  She can return to work on Wednesday November 27th, 2019.

## 2018-02-20 ENCOUNTER — Other Ambulatory Visit (INDEPENDENT_AMBULATORY_CARE_PROVIDER_SITE_OTHER): Payer: Self-pay | Admitting: *Deleted

## 2018-02-20 DIAGNOSIS — C20 Malignant neoplasm of rectum: Secondary | ICD-10-CM

## 2018-02-20 LAB — CEA: CEA: 2.1 ng/mL (ref 0.0–4.7)

## 2018-02-21 ENCOUNTER — Ambulatory Visit (HOSPITAL_COMMUNITY)
Admission: RE | Admit: 2018-02-21 | Discharge: 2018-02-21 | Disposition: A | Discharge status: Home / Self Care | Payer: 59 | Source: Ambulatory Visit | Attending: Internal Medicine | Admitting: Internal Medicine

## 2018-02-21 ENCOUNTER — Other Ambulatory Visit: Payer: Self-pay | Admitting: Family Medicine

## 2018-02-21 ENCOUNTER — Telehealth: Payer: Self-pay | Admitting: Family Medicine

## 2018-02-21 ENCOUNTER — Other Ambulatory Visit (INDEPENDENT_AMBULATORY_CARE_PROVIDER_SITE_OTHER): Payer: Self-pay | Admitting: *Deleted

## 2018-02-21 DIAGNOSIS — C20 Malignant neoplasm of rectum: Secondary | ICD-10-CM | POA: Diagnosis not present

## 2018-02-21 DIAGNOSIS — C218 Malignant neoplasm of overlapping sites of rectum, anus and anal canal: Secondary | ICD-10-CM | POA: Diagnosis not present

## 2018-02-21 MED ORDER — GADOBUTROL 1 MMOL/ML IV SOLN
7.5000 mL | Freq: Once | INTRAVENOUS | Status: AC | PRN
  Administered 2018-02-21: 7.5 mL via INTRAVENOUS

## 2018-02-21 MED ORDER — ALPRAZOLAM 0.5 MG PO TABS: 0.5000 mg | ORAL_TABLET | Freq: Two times a day (BID) | ORAL | 1 refills | Status: DC | PRN

## 2018-02-21 MED ORDER — IOPAMIDOL (ISOVUE-300) INJECTION 61%
100.0000 mL | Freq: Once | INTRAVENOUS | Status: AC | PRN
  Administered 2018-02-21: 100 mL via INTRAVENOUS

## 2018-02-21 MED FILL — ALPRAZolam 0.5 MG TABS: 0.5 | 30 days supply | Qty: 60 | Fill #0

## 2018-02-21 NOTE — Telephone Encounter (Signed)
I did have the opportunity to talk with the patient.  Let her know that we are thinking about her and praying for her.  We did discuss how she is requesting a prescription for Xanax to use when she feels overwhelmed or at evening time to help her sleep.  60 tablets were sent in with 1 refill.  We will connect with her again in a few weeks time

## 2018-02-26 ENCOUNTER — Encounter (HOSPITAL_COMMUNITY): Payer: Self-pay | Admitting: Hematology

## 2018-02-26 ENCOUNTER — Other Ambulatory Visit (HOSPITAL_COMMUNITY): Payer: Self-pay | Admitting: Nurse Practitioner

## 2018-02-26 ENCOUNTER — Other Ambulatory Visit: Payer: Self-pay

## 2018-02-26 ENCOUNTER — Inpatient Hospital Stay (HOSPITAL_COMMUNITY): Payer: 59 | Attending: Hematology | Admitting: Hematology

## 2018-02-26 VITALS — BP 132/83 | HR 101 | Temp 98.1°F | Resp 18 | Ht 67.0 in | Wt 180.4 lb

## 2018-02-26 DIAGNOSIS — Z5111 Encounter for antineoplastic chemotherapy: Secondary | ICD-10-CM | POA: Diagnosis not present

## 2018-02-26 DIAGNOSIS — Z801 Family history of malignant neoplasm of trachea, bronchus and lung: Secondary | ICD-10-CM | POA: Insufficient documentation

## 2018-02-26 DIAGNOSIS — Z452 Encounter for adjustment and management of vascular access device: Secondary | ICD-10-CM | POA: Insufficient documentation

## 2018-02-26 DIAGNOSIS — Z809 Family history of malignant neoplasm, unspecified: Secondary | ICD-10-CM | POA: Insufficient documentation

## 2018-02-26 DIAGNOSIS — K921 Melena: Secondary | ICD-10-CM | POA: Diagnosis not present

## 2018-02-26 DIAGNOSIS — Z87891 Personal history of nicotine dependence: Secondary | ICD-10-CM | POA: Diagnosis not present

## 2018-02-26 DIAGNOSIS — R911 Solitary pulmonary nodule: Secondary | ICD-10-CM | POA: Insufficient documentation

## 2018-02-26 DIAGNOSIS — R634 Abnormal weight loss: Secondary | ICD-10-CM | POA: Insufficient documentation

## 2018-02-26 DIAGNOSIS — Z8042 Family history of malignant neoplasm of prostate: Secondary | ICD-10-CM | POA: Insufficient documentation

## 2018-02-26 DIAGNOSIS — Z85850 Personal history of malignant neoplasm of thyroid: Secondary | ICD-10-CM | POA: Insufficient documentation

## 2018-02-26 DIAGNOSIS — C20 Malignant neoplasm of rectum: Secondary | ICD-10-CM

## 2018-02-26 DIAGNOSIS — Z79899 Other long term (current) drug therapy: Secondary | ICD-10-CM | POA: Diagnosis not present

## 2018-02-26 DIAGNOSIS — R200 Anesthesia of skin: Secondary | ICD-10-CM | POA: Diagnosis not present

## 2018-02-26 DIAGNOSIS — C787 Secondary malignant neoplasm of liver and intrahepatic bile duct: Secondary | ICD-10-CM | POA: Insufficient documentation

## 2018-02-26 DIAGNOSIS — Z791 Long term (current) use of non-steroidal anti-inflammatories (NSAID): Secondary | ICD-10-CM | POA: Insufficient documentation

## 2018-02-26 DIAGNOSIS — F419 Anxiety disorder, unspecified: Secondary | ICD-10-CM | POA: Insufficient documentation

## 2018-02-26 DIAGNOSIS — C19 Malignant neoplasm of rectosigmoid junction: Secondary | ICD-10-CM

## 2018-02-26 DIAGNOSIS — K5900 Constipation, unspecified: Secondary | ICD-10-CM | POA: Diagnosis not present

## 2018-02-26 DIAGNOSIS — R110 Nausea: Secondary | ICD-10-CM | POA: Insufficient documentation

## 2018-02-26 HISTORY — DX: Malignant neoplasm of rectum: C20

## 2018-02-26 NOTE — Progress Notes (Signed)
[facility] CONSULT NOTE  Patient Care Team: [name], MD as PCP - General (Family Medicine)  CHIEF COMPLAINTS/PURPOSE OF CONSULTATION:  Newly diagnosed rectal adenocarcinoma.  HISTORY OF PRESENTING ILLNESS:  Samantha Reynolds 52 y.o. female is seen in consultation today for further work-up and management of newly diagnosed rectal cancer.  She had a colonoscopy in May 2017 which showed tubular adenoma of splenic flexure.  She has been having intermittent bleeding for the last 1 year.  She also reportedly lost about 20 pounds in the last 3 months, at least 5 pounds of it is intentional.  She had a colonoscopy on 02/19/2018 which showed a polypoid and ulcerated nonobstructing medium-sized mass found in the distal rectum.  The mass was partially circumferential involving one third of the lumen circumference.  This was biopsied and consistent with adenocarcinoma.  MRI of the pelvis on 02/21/2018 shows T3 N1/2 staging.  Distance of the tumor to the anal sphincter is 2.1 cm.  CT CAP on 02/21/2018 showed 9 mm left lower lobe pulmonary nodule and 1.6 cm hypoattenuation in the right posterior lobe of the liver.  She works as a Dance movement psychotherapist.  Her only medical problem is a left thyroid cancer, resected 6 years ago.  Family history is significant for paternal grandfather had lung cancer was a smoker.  Maternal aunt had a rare form of cancer on her back.  Father had prostate cancer.  No colorectal cancers in the family.  She is accompanied by her husband today.  MEDICAL HISTORY:  Past Medical History:  Diagnosis Date  . Anxiety   . Cancer [facility]) 2013   thyroid  . Endometrial polyp   . Endometrial thickening on ultra sound   . GERD (gastroesophageal reflux disease)   . H/O Hashimoto thyroiditis   . 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 Hypothyroidism     SURGICAL HISTORY: Past  Surgical History:  Procedure Laterality Date  . BIOPSY  02/19/2018   Procedure: BIOPSY;  Surgeon: Rogene Houston, MD;  [facility];  Service: Endoscopy;;  rectum  . COLONOSCOPY N/A 08/20/2015   Procedure: COLONOSCOPY;  Surgeon: Rogene Houston, MD;  [facility];  Service: Endoscopy;  Laterality: N/A;  730  . [place] ENDOMETRIAL ABLATION  08-13-2004  . FLEXIBLE SIGMOIDOSCOPY N/A 02/19/2018   Procedure: FLEXIBLE SIGMOIDOSCOPY;  Surgeon: Rogene Houston, MD;  [facility];  Service: Endoscopy;  Laterality: N/A;  . HYSTEROSCOPY W/D&C N/A 04/30/2015   Procedure: DILATATION AND CURETTAGE / INTENDED HYSTEROSCOPY;  Surgeon: Dian Queen, MD;  Location: [place];  Service: Gynecology;  Laterality: N/A;  . LAPAROSCOPIC CHOLECYSTECTOMY  04/1996  . POLYPECTOMY  08/20/2015   Procedure: POLYPECTOMY;  Surgeon: Rogene Houston, MD;  [facility];  Service: Endoscopy;;  Splenic flexure polypectomy  . POLYPECTOMY  02/19/2018   Procedure: POLYPECTOMY;  Surgeon: Rogene Houston, MD;  [facility];  Service: Endoscopy;;  rectum  . REDUCTION INCARCERATED UTERUS  06-20-2000   intrauterine preg. 13 wks /  urinary retention  . THYROID LOBECTOMY  11/24/2011   Procedure: THYROID LOBECTOMY;  Surgeon: Earnstine Regal, MD;  Location: [facility];  Service: General;  Laterality: Left;  Left Thyroid Lobectomy  . TUBAL LIGATION  2002    SOCIAL HISTORY: Social History   Socioeconomic History  .  Marital status: Married    Spouse name: Not on file  . Number of children: 4  . Years of education: Not on file  . Highest education level: Not on file  Occupational History  . Not on file  Social Needs  . Financial resource strain: Not hard at all  . Food insecurity:    Worry: Never true    Inability: Never true  . Transportation needs:    Medical: No    Non-medical: No  Tobacco Use  . Smoking status: Former Smoker     Packs/day: 0.25    Years: 10.00    Pack years: 2.50    Types: Cigarettes    Last attempt to quit: 10/31/1991    Years since quitting: 26.3  . Smokeless tobacco: Never Used  Substance and Sexual Activity  . Alcohol use: No  . Drug use: No  . Sexual activity: Not Currently  Lifestyle  . Physical activity:    Days per week: 0 days    Minutes per session: 0 min  . Stress: To some extent  Relationships  . Social connections:    Talks on phone: More than three times a week    Gets together: Twice a week    Attends religious service: More than 4 times per year    Active member of club or organization: Yes    Attends meetings of clubs or organizations: More than 4 times per year    Relationship status: Married  . Intimate partner violence:    Fear of current or ex partner: No    Emotionally abused: No    Physically abused: No    Forced sexual activity: No  Other Topics Concern  . Not on file  Social History Narrative  . Not on file    FAMILY HISTORY: Family History  Problem Relation Age of Onset  . Hypertension Mother   . Hypertension Father   . Prostate cancer Father   . Cancer Maternal Aunt        spine/back  . Cancer Paternal Grandfather        lung  . Healthy Son   . Healthy Son   . Healthy Son   . Healthy Son   . Colon cancer Neg Hx     ALLERGIES:  is allergic to demerol [meperidine]; penicillins; and other.  MEDICATIONS:  Current Outpatient Medications  Medication Sig Dispense Refill  . ALPRAZolam (XANAX) 0.5 MG tablet Take 1 tablet (0.5 mg total) by mouth 2 (two) times daily as needed for anxiety. 60 tablet 1  . fluticasone (CUTIVATE) 0.05 % cream Apply 1 application topically 2 (two) times daily as needed. Skin irritation/rash  3  . ibuprofen (ADVIL,MOTRIN) 200 MG tablet Take 400 mg by mouth every 8 (eight) hours as needed (for pain.).     Marland Kitchen progesterone (PROMETRIUM) 100 MG capsule Take 100 mg by mouth at bedtime.    Marland Kitchen thyroid (ARMOUR) 90 MG tablet Take 90  mg by mouth daily before breakfast.     . calcium carbonate (TUMS - DOSED IN MG ELEMENTAL CALCIUM) 500 MG chewable tablet Chew 1 tablet by mouth 3 (three) times daily as needed for indigestion or heartburn.     Marland Kitchen ketotifen (ALAWAY) 0.025 % ophthalmic solution Place 1 drop into both eyes daily as needed (for allergy eyes.).     No current facility-administered medications for this visit.     REVIEW OF SYSTEMS:   Constitutional: Denies fevers, chills or abnormal night sweats Eyes: Denies blurriness  of vision, double vision or watery eyes Ears, nose, mouth, throat, and face: Denies mucositis or sore throat Respiratory: Denies cough, dyspnea or wheezes Cardiovascular: Denies palpitation, chest discomfort or lower extremity swelling Gastrointestinal: Occasional nausea. Skin: Denies abnormal skin rashes Lymphatics: Denies new lymphadenopathy or easy bruising Neurological:Denies numbness, tingling or new weaknesses Behavioral/Psych: Mood is stable, no new changes  All other systems were reviewed with the patient and are negative.  PHYSICAL EXAMINATION: ECOG PERFORMANCE STATUS: 0 - Asymptomatic  Vitals:   02/26/18 1316  BP: 132/83  Pulse: (!) 101  Resp: 18  Temp: 98.1 F (36.7 C)  SpO2: 100%   Filed Weights   02/26/18 1316  Weight: 180 lb 6.4 oz (81.8 kg)    GENERAL:alert, no distress and comfortable SKIN: skin color, texture, turgor are normal, no rashes or significant lesions EYES: normal, conjunctiva are pink and non-injected, sclera clear OROPHARYNX:no exudate, no erythema and lips, buccal mucosa, and tongue normal  NECK: supple, thyroid normal size, non-tender, without nodularity LYMPH:  no palpable lymphadenopathy in the cervical, axillary or inguinal LUNGS: clear to auscultation and percussion with normal breathing effort HEART: regular rate & rhythm and no murmurs and no lower extremity edema ABDOMEN:abdomen soft, non-tender and normal bowel sounds Musculoskeletal:no  cyanosis of digits and no clubbing  PSYCH: alert & oriented x 3 with fluent speech NEURO: no focal motor/sensory deficits Rectal exam: Mass palpable about 5 to 6 cm from the anal verge.  No blood on the examining glove.  LABORATORY DATA:  I have reviewed the data as listed Lab Results  Component Value Date   WBC 4.5 02/19/2018   HGB 11.1 (L) 02/19/2018   HCT 32.7 (L) 02/19/2018   MCV 94.5 02/19/2018   PLT 147 (L) 02/19/2018     Chemistry      Component Value Date/Time   NA 140 11/03/2015 0841   K 4.1 11/03/2015 0841   CL 101 11/03/2015 0841   CO2 20 11/03/2015 0841   BUN 10 11/03/2015 0841   CREATININE 0.84 11/03/2015 0841      Component Value Date/Time   CALCIUM 9.1 11/03/2015 0841   ALKPHOS 76 11/03/2015 0841   AST 19 11/03/2015 0841   ALT 22 11/03/2015 0841   BILITOT 2.1 (H) 11/03/2015 0841       RADIOGRAPHIC STUDIES: I have personally reviewed the radiological images as listed and agreed with the findings in the report. Ct Chest W Contrast  Result Date: 02/21/2018 CLINICAL DATA:  Rectal cancer on colonoscopy. Hematochezia for 1 year. Thyroid cancer with thyroidectomy in 2013. EXAM: CT CHEST, ABDOMEN, AND PELVIS WITH CONTRAST TECHNIQUE: Multidetector CT imaging of the chest, abdomen and pelvis was performed following the standard protocol during bolus administration of intravenous contrast. CONTRAST:  167mL ISOVUE-300 IOPAMIDOL (ISOVUE-300) INJECTION 61% COMPARISON:  Colonoscopy report 02/19/2018.  Chest CT 07/26/07 FINDINGS: CT CHEST FINDINGS Cardiovascular: Ascending aortic dilatation is mild, including at 4.2 cm on image 30/2. Similar to minimally increased compared to 4.1 cm on 07/26/2007. Normal caliber of the transverse and descending aorta. Normal heart size, without pericardial effusion. No central pulmonary embolism, on this non-dedicated study. Mediastinum/Nodes: Left thyroidectomy. No supraclavicular adenopathy. No mediastinal or hilar adenopathy. Tiny hiatal  hernia. Lungs/Pleura: No pleural fluid. Isolated, mildly spiculated pleural-based left lower lobe 9 x 7 mm pulmonary nodule on image 136/4, new since 2009. Possible subpleural left lower lobe 3 mm nodule versus area of pleural thickening on image 108/4. Musculoskeletal: No acute osseous abnormality. CT ABDOMEN PELVIS FINDINGS Hepatobiliary: Focal  steatosis adjacent the falciform ligament. Vague area of hypoattenuation or hypoenhancement in the posterior right hepatic lobe measures 1.6 cm on image 61/2. There is more wedge-shaped mild hypoenhancement peripheral to this including on image 61/2. Cholecystectomy, without biliary ductal dilatation. Pancreas: Normal, without mass or ductal dilatation. Spleen: Hypoattenuating splenic lesion of 1.9 cm anteriorly is present at 1.3 cm in 2009 and is likely a benign lesion such as a hemangioma or lymphangioma. Adrenals/Urinary Tract: Normal adrenal glands. Normal kidneys, without hydronephrosis. Normal urinary bladder. Stomach/Bowel: Normal remainder of the stomach. Low rectal wall thickening and asymmetric right-sided mucosal hyperenhancement, including on image 123/2. Small perirectal nodes, the largest of which measures 7 mm on image 118/2 (5 o'clock position). Normal terminal ileum and appendix.  Normal small bowel. Vascular/Lymphatic: Aortic atherosclerosis. No abdominal or pelvic sidewall adenopathy. Reproductive: Retroverted uterus.  No adnexal mass. Other: No significant free fluid. Musculoskeletal: No acute osseous abnormality. IMPRESSION: 1. Rectal wall thickening, likely representing the primary lesion. Perirectal nodes of maximally 7 mm, suspicious for nodal metastasis. 2. Isolated 9 mm left lower lobe pulmonary nodule, new since 07/26/2007. Suspicious for pulmonary metastasis from 1 of the patient's primaries. Consider further evaluation with PET versus tissue sampling. 3. Right hepatic lobe hypoattenuation, including central more nodular component and peripheral  more wedge-shaped component. Indeterminate. Differential considerations include a benign or malignant lesion with secondary altered perfusion. Given geographic appearance, focal steatosis is possible. This would also be better characterized with PET and especially if PET is not definitive, dedicated pre and post contrast abdominal MRI. 4.  Aortic Atherosclerosis (ICD10-I70.0). 5. Slight increase in ascending aortic dilatation. Recommend annual imaging followup by CTA or MRA. This recommendation follows 2010 ACCF/AHA/AATS/ACR/ASA/SCA/SCAI/SIR/STS/SVM Guidelines for the Diagnosis and Management of Patients with Thoracic Aortic Disease. Circulation. 2010; 121: S283-T517 Electronically Signed   By: Abigail Miyamoto M.D.   On: 02/21/2018 12:27   Mr Pelvis W OH Contrast  Result Date: 02/21/2018 CLINICAL DATA:  New diagnosis of rectal cancer. EXAM: MRI PELVIS WITHOUT AND WITH CONTRAST TECHNIQUE: Multiplanar multisequence MR imaging of the pelvis was performed both before and after administration of intravenous contrast. Small amount of [name] gel was administered per rectum to optimize tumor evaluation. CONTRAST:  7.5 cc Gadavist COMPARISON:  Today's CT, dictated separately. Colonoscopy report of 02/19/2018 also reviewed. FINDINGS: TUMOR LOCATION Location from Anal Verge:  Mid, 7.2 cm from anal verge on 16/3 Shortest Distance from Tumor to Anal Sphincter: On the order of 2.1 cm on image 18/6 TUMOR DESCRIPTION Circumferential extent: approximately 180 degree circumferential, centered about the 6 o'clock position on image 21/4. Craniocaudal Extent:  4.4 cm on image 14/6 T - CATEGORY Extension through Muscularis Propria:  Yes, T3 Maximum extension beyond Muscularis Propria: 6 mm, including on image 21/4 (advanced T3 = >67mm) Extramural vascular invasion/tumor thrombus: Likely present including at the 5 o'clock position on image 10/9 and image 16/4. Shortest distance of any tumor/node from Mesorectal Fascia: 7 mm, including on  image 21/4. Invasion of Anterior Peritoneal Reflection:  No Involvement of Adjacent Organs or Pelvic Sidewall Structures:  No N - CATEGORY Mesorectal Lymph Nodes >=15mm: Up to 6 mm at the 7 o'clock position on image 3/9 and image 8/9. Extra-mesorectal Lymphadenopathy: Equivocal. Right obturator node of 6 mm on image 10/9. Right external iliac node at 8 mm on image 15/9. Other: No significant free fluid. Normal urinary bladder, uterus, and adnexa. IMPRESSION: Rectal adenocarcinoma T stage:   T3 Rectal adenocarcinoma N stage: N1-equivocal N2 nodes. Distance from tumor to  the anal sphincter is 2.1 cm. Electronically Signed   By: Abigail Miyamoto M.D.   On: 02/21/2018 14:01   Ct Abdomen Pelvis W Contrast  Result Date: 02/21/2018 CLINICAL DATA:  Rectal cancer on colonoscopy. Hematochezia for 1 year. Thyroid cancer with thyroidectomy in 2013. EXAM: CT CHEST, ABDOMEN, AND PELVIS WITH CONTRAST TECHNIQUE: Multidetector CT imaging of the chest, abdomen and pelvis was performed following the standard protocol during bolus administration of intravenous contrast. CONTRAST:  143mL ISOVUE-300 IOPAMIDOL (ISOVUE-300) INJECTION 61% COMPARISON:  Colonoscopy report 02/19/2018.  Chest CT 07/26/07 FINDINGS: CT CHEST FINDINGS Cardiovascular: Ascending aortic dilatation is mild, including at 4.2 cm on image 30/2. Similar to minimally increased compared to 4.1 cm on 07/26/2007. Normal caliber of the transverse and descending aorta. Normal heart size, without pericardial effusion. No central pulmonary embolism, on this non-dedicated study. Mediastinum/Nodes: Left thyroidectomy. No supraclavicular adenopathy. No mediastinal or hilar adenopathy. Tiny hiatal hernia. Lungs/Pleura: No pleural fluid. Isolated, mildly spiculated pleural-based left lower lobe 9 x 7 mm pulmonary nodule on image 136/4, new since 2009. Possible subpleural left lower lobe 3 mm nodule versus area of pleural thickening on image 108/4. Musculoskeletal: No acute osseous  abnormality. CT ABDOMEN PELVIS FINDINGS Hepatobiliary: Focal steatosis adjacent the falciform ligament. Vague area of hypoattenuation or hypoenhancement in the posterior right hepatic lobe measures 1.6 cm on image 61/2. There is more wedge-shaped mild hypoenhancement peripheral to this including on image 61/2. Cholecystectomy, without biliary ductal dilatation. Pancreas: Normal, without mass or ductal dilatation. Spleen: Hypoattenuating splenic lesion of 1.9 cm anteriorly is present at 1.3 cm in 2009 and is likely a benign lesion such as a hemangioma or lymphangioma. Adrenals/Urinary Tract: Normal adrenal glands. Normal kidneys, without hydronephrosis. Normal urinary bladder. Stomach/Bowel: Normal remainder of the stomach. Low rectal wall thickening and asymmetric right-sided mucosal hyperenhancement, including on image 123/2. Small perirectal nodes, the largest of which measures 7 mm on image 118/2 (5 o'clock position). Normal terminal ileum and appendix.  Normal small bowel. Vascular/Lymphatic: Aortic atherosclerosis. No abdominal or pelvic sidewall adenopathy. Reproductive: Retroverted uterus.  No adnexal mass. Other: No significant free fluid. Musculoskeletal: No acute osseous abnormality. IMPRESSION: 1. Rectal wall thickening, likely representing the primary lesion. Perirectal nodes of maximally 7 mm, suspicious for nodal metastasis. 2. Isolated 9 mm left lower lobe pulmonary nodule, new since 07/26/2007. Suspicious for pulmonary metastasis from 1 of the patient's primaries. Consider further evaluation with PET versus tissue sampling. 3. Right hepatic lobe hypoattenuation, including central more nodular component and peripheral more wedge-shaped component. Indeterminate. Differential considerations include a benign or malignant lesion with secondary altered perfusion. Given geographic appearance, focal steatosis is possible. This would also be better characterized with PET and especially if PET is not  definitive, dedicated pre and post contrast abdominal MRI. 4.  Aortic Atherosclerosis (ICD10-I70.0). 5. Slight increase in ascending aortic dilatation. Recommend annual imaging followup by CTA or MRA. This recommendation follows 2010 ACCF/AHA/AATS/ACR/ASA/SCA/SCAI/SIR/STS/SVM Guidelines for the Diagnosis and Management of Patients with Thoracic Aortic Disease. Circulation. 2010; 121: [id number] Electronically Signed   By: Abigail Miyamoto M.D.   On: 02/21/2018 12:27    ASSESSMENT & PLAN:  Rectal cancer ([place]) 1.  T3 N1/2 rectal adenocarcinoma: - 1 year of intermittent rectal bleeding, reports 20 pounds of weight loss in the last 3 months, although some of it is intentional. - Colonoscopy on 02/19/2018 shows rectal mass, 2 to 4 cm from the anal verge, normal sigmoid colon.  Biopsies consistent with adenocarcinoma. - MRI of the pelvis shows rectal adenocarcinoma,  T stage-T3, N stage- N1-equivocal N2 nodes.  Maximum extension beyond muscularis propria is 6 mm (advanced T3).  Circumferential resection margin is 7 mm.  Right obturator node 6 mm.  Right external iliac node at 8 mm.  Craniocaudal extent of the tumor is 4.4 cm.  Approximately 180 degree circumferential.  Shortest distance from the distal tumor to anal sphincter is 2.1 cm.  Location from anal verge, 7.2 cm to the middle of the tumor. - CT CAP on 02/21/2018 shows 9 x 7 mm left lower lobe pulmonary nodule, new since 2009.  Possible subpleural left lower lobe 3 mm nodule versus area of pleural thickening.  Vague area of hypoattenuation in the posterior right hepatic lobe measuring 1.6 cm.  Differential includes benign/malignant lesion with altered perfusion.  Focal steatosis is possible. - We talked about pathology and scan findings in detail.  She has a clear circumferential resection margin, greater than 1 mm from mesorectal fascia. - We will make an appointment for her to see Dr. Marcello Moores.  We will also make a referral to radiation oncology. - We talked  about combination chemoradiation therapy with Xeloda.  We will have a multidisciplinary discussion after she has a PET CT scan.  I have recommended PET CT scan due to the vague left lower lobe pulmonary nodule and right hepatic nodule. - We will also consider upfront chemotherapy (total neoadjuvant therapy) if Dr. Marcello Moores feels it will be useful for getting better margins. - She will also need port placement for administration of chemotherapy. -We also briefly talked about side effects of chemotherapy.  2.  Thyroid cancer: -She had a left thyroidectomy done about 6 years ago.  She is on Synthroid at this time. -We plan to check her TSH, thyroglobulin and antithyroglobulin antibodies in later part of this month.  Orders Placed This Encounter  Procedures  . Ambulatory referral to Social Work    Referral Priority:   Routine    Referral Type:   Consultation    Referral Reason:   Specialty Services Required    Number of Visits Requested:   1    All questions were answered. The patient knows to call the clinic with any problems, questions or concerns.     Derek Jack, MD 02/26/2018 3:51 PM

## 2018-02-26 NOTE — Assessment & Plan Note (Addendum)
1.  T3 N1/2 rectal adenocarcinoma: - 1 year of intermittent rectal bleeding, reports 20 pounds of weight loss in the last 3 months, although some of it is intentional. - Colonoscopy on 02/19/2018 shows rectal mass, 2 to 4 cm from the anal verge, normal sigmoid colon.  Biopsies consistent with adenocarcinoma. - MRI of the pelvis shows rectal adenocarcinoma, T stage-T3, N stage- N1-equivocal N2 nodes.  Maximum extension beyond muscularis propria is 6 mm (advanced T3).  Circumferential resection margin is 7 mm.  Right obturator node 6 mm.  Right external iliac node at 8 mm.  Craniocaudal extent of the tumor is 4.4 cm.  Approximately 180 degree circumferential.  Shortest distance from the distal tumor to anal sphincter is 2.1 cm.  Location from anal verge, 7.2 cm to the middle of the tumor. - CT CAP on 02/21/2018 shows 9 x 7 mm left lower lobe pulmonary nodule, new since 2009.  Possible subpleural left lower lobe 3 mm nodule versus area of pleural thickening.  Vague area of hypoattenuation in the posterior right hepatic lobe measuring 1.6 cm.  Differential includes benign/malignant lesion with altered perfusion.  Focal steatosis is possible. - We talked about pathology and scan findings in detail.  She has a clear circumferential resection margin, greater than 1 mm from mesorectal fascia. - We will make an appointment for her to see Dr. Marcello Moores.  We will also make a referral to radiation oncology. - We talked about combination chemoradiation therapy with Xeloda.  We will have a multidisciplinary discussion after she has a PET CT scan.  I have recommended PET CT scan due to the vague left lower lobe pulmonary nodule and right hepatic nodule. - We will also consider upfront chemotherapy (total neoadjuvant therapy) if Dr. Marcello Moores feels it will be useful for getting better margins. - She will also need port placement for administration of chemotherapy. -We also briefly talked about side effects of  chemotherapy.  2.  Thyroid cancer: -She had a left thyroidectomy done about 6 years ago.  She is on Synthroid at this time. -We plan to check her TSH, thyroglobulin and antithyroglobulin antibodies in later part of this month.

## 2018-02-26 NOTE — Patient Instructions (Signed)
[facility] at [facility] Discharge Instructions     Thank you for choosing [facility] at [facility] to provide your oncology and hematology care.  To afford each patient quality time with our provider, please arrive at least 15 minutes before your scheduled appointment time.   If you have a lab appointment with the Cancer Center please come in thru the  Main Entrance and check in at the main information desk  You need to re-schedule your appointment should you arrive 10 or more minutes late.  We strive to give you quality time with our providers, and arriving late affects you and other patients whose appointments are after yours.  Also, if you no show three or more times for appointments you may be dismissed from the clinic at the providers discretion.     Again, thank you for choosing [facility].  Our hope is that these requests will decrease the amount of time that you wait before being seen by our physicians.       _____________________________________________________________  Should you have questions after your visit to [facility], please contact our office at [phone] between the hours of 8:00 a.m. and 4:30 p.m.  Voicemails left after 4:00 p.m. will not be returned until the following business day.  For prescription refill requests, have your pharmacy contact our office and allow 72 hours.    Cancer Center Support Programs:   > Cancer Support Group  2nd Tuesday of the month 1pm-2pm, Journey Room    

## 2018-02-27 ENCOUNTER — Encounter: Payer: Self-pay | Admitting: General Practice

## 2018-02-27 DIAGNOSIS — C20 Malignant neoplasm of rectum: Secondary | ICD-10-CM | POA: Diagnosis not present

## 2018-02-27 MED FILL — PROGESTERONE 100 MG CAPSULE: 100 | 90 days supply | Qty: 90 | Fill #2

## 2018-02-27 NOTE — Progress Notes (Signed)
[place] Psychosocial Distress Screening Clinical Social Work  Clinical Social Work was referred by distress screening protocol.  The patient scored a 10 on the Psychosocial Distress Thermometer which indicates severe distress. Clinical Social Worker contacted patient by phone to assess for distress and other psychosocial needs. Unable to reach patient, left VM w my contact information and encouraged her to call back. CSW will also reach out to patient again.  ONCBCN DISTRESS SCREENING 02/26/2018  Screening Type Initial Screening  Distress experienced in past week (1-10) 10  Emotional problem type Adjusting to illness  Information Concerns Type Lack of info about diagnosis;Lack of info about treatment;Lack of info about complementary therapy choices  Physical Problem type Nausea/vomiting;Pain;Loss of appetitie    Clinical Social Worker follow up needed: Yes.   Continue to try to reach patient.    If yes, follow up plan:  Beverely Pace, [place], LCSW Clinical Social Worker Phone:  [phone]

## 2018-02-28 ENCOUNTER — Ambulatory Visit: Payer: Self-pay | Admitting: General Surgery

## 2018-02-28 NOTE — Progress Notes (Signed)
GI Location of Tumor / Histology: Rectal Cancer  [place] presented with intermittent bleeding for about a year and about a 20 pound weight loss.  5 of which was intentional.  No real pain, described as discomfort.  PET 03/05/2018:  CT CAP 02/21/2018: 9 mm left lower lobe pulmonary nodule and 1.6 cm hypo-attenuation in the right posterior lobe of the liver.  MRI Pelvis 02/21/2018:  Staging scan T3, N1-equivocal N2 nodes, 2.1 cm from tumor to anal sphincter.  Colonoscopy 02/19/2018:  Polypoid and ulcerated non-obstructing medium sized mass found in the distal rectum.  The mass was partially circumferential involving one third of the lumen circumference.  Biopsies of Rectum 02/19/2018    Past/Anticipated interventions by surgeon, if any:  Dr. Marcello Moores 02/27/2018 -MRI shows good potential margins. -I have recommended a proctectomy with coloanal anastomosis when she has completed neoadjuvant treatment. -She seems to be a good candidate for total neoadjuvant therapy as well. -I will leave this decision up to her and her oncologist. -I will see her back approximately 1 week after completing radiation treatment.  Past/Anticipated interventions by medical oncology, if any:  Dr. Delton Coombes 02/26/2018 -We talked about pathology and scan findings in detail.  She has a clear circumferential resection margin, greater than 1 mm from mesorectal fascia. -We will make an appointment for her to see Dr. Marcello Moores.  We will also make a referral to radiation oncology. -We talked about combination chemoradiation therapy with xeloda. We will have a multidisciplinary discussion after she has a PET CT scan.  I have recommended PET CT scan due to the vague left lower lobe pulmonary nodule and right hepatic nodule. -We will also consider upfront chemotherapy if Dr. Marcello Moores feels it will be useful for getting better margins. -She will also need port placement for administration of chemotherapy. -03/07/2018  follow-up   Weight changes, if any: About 20 pound weight loss.  Bowel/Bladder complaints, if any: Having softer more frequent bowel movements.  Nausea / Vomiting, if any: Having some nausea.  Pain issues, if any:  Just discomfort.  Any blood per rectum: Having some bleeding with bowels, some darker red early in the morning, turning brighter throughout the day.   BP 137/80 (BP Location: Left Arm, Patient Position: Sitting)   Pulse (!) 111   Temp 98.8 F (37.1 C) (Oral)   Resp 18   Ht 5\' 7"  (1.702 m)   Wt 177 lb 4 oz (80.4 kg)   LMP 02/16/2015   SpO2 100%   BMI 27.76 kg/m    Wt Readings from Last 3 Encounters:  03/01/18 177 lb 4 oz (80.4 kg)  02/26/18 180 lb 6.4 oz (81.8 kg)  02/19/18 183 lb (83 kg)   SAFETY ISSUES:  Prior radiation? No  Pacemaker/ICD? No  Possible current pregnancy? No  Is the patient on methotrexate? No  Current Complaints/Details: Left thyroid cancer- thyroidectomy 6 years ago.

## 2018-03-01 ENCOUNTER — Other Ambulatory Visit: Payer: Self-pay

## 2018-03-01 ENCOUNTER — Ambulatory Visit
Admission: RE | Admit: 2018-03-01 | Discharge: 2018-03-01 | Disposition: A | Discharge status: Home / Self Care | Payer: 59 | Source: Ambulatory Visit | Attending: Radiation Oncology | Admitting: Radiation Oncology

## 2018-03-01 ENCOUNTER — Encounter: Payer: Self-pay | Admitting: Radiation Oncology

## 2018-03-01 VITALS — BP 137/80 | HR 111 | Temp 98.8°F | Resp 18 | Ht 67.0 in | Wt 177.2 lb

## 2018-03-01 DIAGNOSIS — Z87891 Personal history of nicotine dependence: Secondary | ICD-10-CM | POA: Diagnosis not present

## 2018-03-01 DIAGNOSIS — Z808 Family history of malignant neoplasm of other organs or systems: Secondary | ICD-10-CM | POA: Diagnosis not present

## 2018-03-01 DIAGNOSIS — Z79899 Other long term (current) drug therapy: Secondary | ICD-10-CM | POA: Insufficient documentation

## 2018-03-01 DIAGNOSIS — C20 Malignant neoplasm of rectum: Secondary | ICD-10-CM

## 2018-03-01 DIAGNOSIS — Z85850 Personal history of malignant neoplasm of thyroid: Secondary | ICD-10-CM | POA: Diagnosis not present

## 2018-03-01 DIAGNOSIS — E039 Hypothyroidism, unspecified: Secondary | ICD-10-CM | POA: Insufficient documentation

## 2018-03-01 NOTE — Progress Notes (Addendum)
Radiation Oncology         [phone] ________________________________  Name: Samantha Reynolds        MRN: [id number]  Date of Service: 03/01/2018 DOB: [birth year]  [facility]:[id number], Elayne Snare, MD  Derek Jack, MD     REFERRING PHYSICIAN: Derek Jack, MD   DIAGNOSIS: The primary encounter diagnosis was Rectal adenocarcinoma [facility]). A diagnosis of Rectal cancer [facility]) was also pertinent to this visit.   HISTORY OF PRESENT ILLNESS: Samantha Reynolds is a 52 y.o. female seen at the request of Dr. Delton Coombes with a newly diagnosed rectal cancer. The patient has had 20 pounds of weight loss and intermittent rectal bleeding over the last year. She was evaluated by GI in 2017 and had a tubular adenoma. A colonoscopy on 02/19/18 revealed a polypoid, nonobstructing mass in the distal rectum. It involved one third of the lumen circumference. A biopsy revealed adenocarcinoma. She underwent a CT CAP on 02/21/18 that revealed a 9 mm LLL nodule and a 1.6 cm hypoattenuation in the right posterior lobe of the liver, and her MRI of the pelvis that day revealed T3N1-2 disease. She has met with Dr. Marcello Moores as well and comes today to discuss treatment recommendations.    PREVIOUS RADIATION THERAPY: No   PAST MEDICAL HISTORY:  Past Medical History:  Diagnosis Date  . Anxiety   . Cancer [facility]) 2013   thyroid  . Endometrial polyp   . Endometrial thickening on ultra sound   . GERD (gastroesophageal reflux disease)   . H/O Hashimoto thyroiditis   . 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 Hypothyroidism        PAST SURGICAL HISTORY: Past Surgical History:  Procedure Laterality Date  . BIOPSY  02/19/2018   Procedure: BIOPSY;  Surgeon: Rogene Houston, MD;  [facility];  Service: Endoscopy;;  rectum  . COLONOSCOPY N/A 08/20/2015   Procedure: COLONOSCOPY;  Surgeon: Rogene Houston, MD;   [facility];  Service: Endoscopy;  Laterality: N/A;  730  . [place] ENDOMETRIAL ABLATION  08-13-2004  . FLEXIBLE SIGMOIDOSCOPY N/A 02/19/2018   Procedure: FLEXIBLE SIGMOIDOSCOPY;  Surgeon: Rogene Houston, MD;  [facility];  Service: Endoscopy;  Laterality: N/A;  . HYSTEROSCOPY W/D&C N/A 04/30/2015   Procedure: DILATATION AND CURETTAGE / INTENDED HYSTEROSCOPY;  Surgeon: Dian Queen, MD;  Location: [place];  Service: Gynecology;  Laterality: N/A;  . LAPAROSCOPIC CHOLECYSTECTOMY  04/1996  . POLYPECTOMY  08/20/2015   Procedure: POLYPECTOMY;  Surgeon: Rogene Houston, MD;  [facility];  Service: Endoscopy;;  Splenic flexure polypectomy  . POLYPECTOMY  02/19/2018   Procedure: POLYPECTOMY;  Surgeon: Rogene Houston, MD;  [facility];  Service: Endoscopy;;  rectum  . REDUCTION INCARCERATED UTERUS  06-20-2000   intrauterine preg. 13 wks /  urinary retention  . THYROID LOBECTOMY  11/24/2011   Procedure: THYROID LOBECTOMY;  Surgeon: Earnstine Regal, MD;  Location: [facility];  Service: General;  Laterality: Left;  Left Thyroid Lobectomy  . TUBAL LIGATION  2002     FAMILY HISTORY:  Family History  Problem Relation Age of Onset  . Hypertension Mother   . Hypertension Father   . Prostate cancer Father   . Cancer Maternal Aunt        spine/back  . Cancer Paternal Grandfather  lung  . Healthy Son   . Healthy Son   . Healthy Son   . Healthy Son   . Colon cancer Neg Hx      SOCIAL HISTORY:  reports that she quit smoking about 26 years ago. Her smoking use included cigarettes. She has a 2.50 pack-year smoking history. She has never used smokeless tobacco. She reports that she does not drink alcohol or use drugs.   ALLERGIES: Demerol [meperidine]; Penicillins; and Other   MEDICATIONS:  Current Outpatient Medications  Medication Sig Dispense Refill  . ALPRAZolam (XANAX) 0.5 MG tablet Take 1  tablet (0.5 mg total) by mouth 2 (two) times daily as needed for anxiety. 60 tablet 1  . calcium carbonate (TUMS - DOSED IN MG ELEMENTAL CALCIUM) 500 MG chewable tablet Chew 1 tablet by mouth 3 (three) times daily as needed for indigestion or heartburn.     . fluticasone (CUTIVATE) 0.05 % cream Apply 1 application topically 2 (two) times daily as needed. Skin irritation/rash  3  . ibuprofen (ADVIL,MOTRIN) 200 MG tablet Take 400 mg by mouth every 8 (eight) hours as needed (for pain.).     Marland Kitchen ketotifen (ALAWAY) 0.025 % ophthalmic solution Place 1 drop into both eyes daily as needed (for allergy eyes.).    Marland Kitchen progesterone (PROMETRIUM) 100 MG capsule Take 100 mg by mouth at bedtime.    Marland Kitchen thyroid (ARMOUR) 90 MG tablet Take 90 mg by mouth daily before breakfast.      No current facility-administered medications for this encounter.      REVIEW OF SYSTEMS: On review of systems, the patient reports that she is doing well overall but is concerned about her cancer diagnosis and feels a bit numb about acknowledging she has a cancer. She denies any chest pain, shortness of breath, cough, fevers, chills, night sweats, unintended weight changes. She denies any bladder disturbances, and denies abdominal pain, nausea or vomiting. She does have some deep pelvic pressure and rectal bleeding as well as loose stools which predates her bleeding symptoms due to cholecystectomy. She denies any new musculoskeletal or joint aches or pains. A complete review of systems is obtained and is otherwise negative.     PHYSICAL EXAM:  Wt Readings from Last 3 Encounters:  03/01/18 177 lb 4 oz (80.4 kg)  02/26/18 180 lb 6.4 oz (81.8 kg)  02/19/18 183 lb (83 kg)   Temp Readings from Last 3 Encounters:  03/01/18 98.8 F (37.1 C) (Oral)  02/26/18 98.1 F (36.7 C) (Oral)  02/19/18 98.6 F (37 C) (Oral)   BP Readings from Last 3 Encounters:  03/01/18 137/80  02/26/18 132/83  02/19/18 113/80   Pulse Readings from Last 3  Encounters:  03/01/18 (!) 111  02/26/18 (!) 101  02/19/18 (!) 102   Pain Assessment Pain Score: 0-No pain/10  In general this is a well appearing caucasian female in no acute distress. She is alert and oriented x4 and appropriate throughout the examination. HEENT reveals that the patient is normocephalic, atraumatic. EOMs are intact. Skin is intact without any evidence of gross lesions. Cardiopulmonary assessment is negative for acute distress and she exhibits normal effort.    ECOG = 1  0 - Asymptomatic (Fully active, able to carry on all predisease activities without restriction)  1 - Symptomatic but completely ambulatory (Restricted in physically strenuous activity but ambulatory and able to carry out work of a light or sedentary nature. For example, light housework, office work)  2 - Symptomatic, <50% in bed  during the day (Ambulatory and capable of all self care but unable to carry out any work activities. Up and about more than 50% of waking hours)  3 - Symptomatic, >50% in bed, but not bedbound (Capable of only limited self-care, confined to bed or chair 50% or more of waking hours)  4 - Bedbound (Completely disabled. Cannot carry on any self-care. Totally confined to bed or chair)  5 - Death   Eustace Pen MM, Creech RH, Tormey DC, et al. [phone]). "Toxicity and response criteria of the [employer]". [place] Oncol. 5 (6): 649-55    LABORATORY DATA:  Lab Results  Component Value Date   WBC 4.5 02/19/2018   HGB 11.1 (L) 02/19/2018   HCT 32.7 (L) 02/19/2018   MCV 94.5 02/19/2018   PLT 147 (L) 02/19/2018   Lab Results  Component Value Date   NA 140 11/03/2015   K 4.1 11/03/2015   CL 101 11/03/2015   CO2 20 11/03/2015   Lab Results  Component Value Date   ALT 22 11/03/2015   AST 19 11/03/2015   ALKPHOS 76 11/03/2015   BILITOT 2.1 (H) 11/03/2015      RADIOGRAPHY: Ct Chest W Contrast  Result Date: 02/21/2018 CLINICAL DATA:  Rectal cancer on  colonoscopy. Hematochezia for 1 year. Thyroid cancer with thyroidectomy in 2013. EXAM: CT CHEST, ABDOMEN, AND PELVIS WITH CONTRAST TECHNIQUE: Multidetector CT imaging of the chest, abdomen and pelvis was performed following the standard protocol during bolus administration of intravenous contrast. CONTRAST:  166m ISOVUE-300 IOPAMIDOL (ISOVUE-300) INJECTION 61% COMPARISON:  Colonoscopy report 02/19/2018.  Chest CT 07/26/07 FINDINGS: CT CHEST FINDINGS Cardiovascular: Ascending aortic dilatation is mild, including at 4.2 cm on image 30/2. Similar to minimally increased compared to 4.1 cm on 07/26/2007. Normal caliber of the transverse and descending aorta. Normal heart size, without pericardial effusion. No central pulmonary embolism, on this non-dedicated study. Mediastinum/Nodes: Left thyroidectomy. No supraclavicular adenopathy. No mediastinal or hilar adenopathy. Tiny hiatal hernia. Lungs/Pleura: No pleural fluid. Isolated, mildly spiculated pleural-based left lower lobe 9 x 7 mm pulmonary nodule on image 136/4, new since 2009. Possible subpleural left lower lobe 3 mm nodule versus area of pleural thickening on image 108/4. Musculoskeletal: No acute osseous abnormality. CT ABDOMEN PELVIS FINDINGS Hepatobiliary: Focal steatosis adjacent the falciform ligament. Vague area of hypoattenuation or hypoenhancement in the posterior right hepatic lobe measures 1.6 cm on image 61/2. There is more wedge-shaped mild hypoenhancement peripheral to this including on image 61/2. Cholecystectomy, without biliary ductal dilatation. Pancreas: Normal, without mass or ductal dilatation. Spleen: Hypoattenuating splenic lesion of 1.9 cm anteriorly is present at 1.3 cm in 2009 and is likely a benign lesion such as a hemangioma or lymphangioma. Adrenals/Urinary Tract: Normal adrenal glands. Normal kidneys, without hydronephrosis. Normal urinary bladder. Stomach/Bowel: Normal remainder of the stomach. Low rectal wall thickening and  asymmetric right-sided mucosal hyperenhancement, including on image 123/2. Small perirectal nodes, the largest of which measures 7 mm on image 118/2 (5 o'clock position). Normal terminal ileum and appendix.  Normal small bowel. Vascular/Lymphatic: Aortic atherosclerosis. No abdominal or pelvic sidewall adenopathy. Reproductive: Retroverted uterus.  No adnexal mass. Other: No significant free fluid. Musculoskeletal: No acute osseous abnormality. IMPRESSION: 1. Rectal wall thickening, likely representing the primary lesion. Perirectal nodes of maximally 7 mm, suspicious for nodal metastasis. 2. Isolated 9 mm left lower lobe pulmonary nodule, new since 07/26/2007. Suspicious for pulmonary metastasis from 1 of the patient's primaries. Consider further evaluation with PET versus tissue sampling. 3.  Right hepatic lobe hypoattenuation, including central more nodular component and peripheral more wedge-shaped component. Indeterminate. Differential considerations include a benign or malignant lesion with secondary altered perfusion. Given geographic appearance, focal steatosis is possible. This would also be better characterized with PET and especially if PET is not definitive, dedicated pre and post contrast abdominal MRI. 4.  Aortic Atherosclerosis (ICD10-I70.0). 5. Slight increase in ascending aortic dilatation. Recommend annual imaging followup by CTA or MRA. This recommendation follows 2010 ACCF/AHA/AATS/ACR/ASA/SCA/SCAI/SIR/STS/SVM Guidelines for the Diagnosis and Management of Patients with Thoracic Aortic Disease. Circulation. 2010; 121: X528-U132 Electronically Signed   By: Abigail Miyamoto M.D.   On: 02/21/2018 12:27   Mr Pelvis W GM Contrast  Result Date: 02/21/2018 CLINICAL DATA:  New diagnosis of rectal cancer. EXAM: MRI PELVIS WITHOUT AND WITH CONTRAST TECHNIQUE: Multiplanar multisequence MR imaging of the pelvis was performed both before and after administration of intravenous contrast. Small amount of [name] gel  was administered per rectum to optimize tumor evaluation. CONTRAST:  7.5 cc Gadavist COMPARISON:  Today's CT, dictated separately. Colonoscopy report of 02/19/2018 also reviewed. FINDINGS: TUMOR LOCATION Location from Anal Verge:  Mid, 7.2 cm from anal verge on 16/3 Shortest Distance from Tumor to Anal Sphincter: On the order of 2.1 cm on image 18/6 TUMOR DESCRIPTION Circumferential extent: approximately 180 degree circumferential, centered about the 6 o'clock position on image 21/4. Craniocaudal Extent:  4.4 cm on image 14/6 T - CATEGORY Extension through Muscularis Propria:  Yes, T3 Maximum extension beyond Muscularis Propria: 6 mm, including on image 21/4 (advanced T3 = >56m) Extramural vascular invasion/tumor thrombus: Likely present including at the 5 o'clock position on image 10/9 and image 16/4. Shortest distance of any tumor/node from Mesorectal Fascia: 7 mm, including on image 21/4. Invasion of Anterior Peritoneal Reflection:  No Involvement of Adjacent Organs or Pelvic Sidewall Structures:  No N - CATEGORY Mesorectal Lymph Nodes >=551m Up to 6 mm at the 7 o'clock position on image 3/9 and image 8/9. Extra-mesorectal Lymphadenopathy: Equivocal. Right obturator node of 6 mm on image 10/9. Right external iliac node at 8 mm on image 15/9. Other: No significant free fluid. Normal urinary bladder, uterus, and adnexa. IMPRESSION: Rectal adenocarcinoma T stage:   T3 Rectal adenocarcinoma N stage: N1-equivocal N2 nodes. Distance from tumor to the anal sphincter is 2.1 cm. Electronically Signed   By: KyAbigail Miyamoto.D.   On: 02/21/2018 14:01   Ct Abdomen Pelvis W Contrast  Result Date: 02/21/2018 CLINICAL DATA:  Rectal cancer on colonoscopy. Hematochezia for 1 year. Thyroid cancer with thyroidectomy in 2013. EXAM: CT CHEST, ABDOMEN, AND PELVIS WITH CONTRAST TECHNIQUE: Multidetector CT imaging of the chest, abdomen and pelvis was performed following the standard protocol during bolus administration of intravenous  contrast. CONTRAST:  10035mSOVUE-300 IOPAMIDOL (ISOVUE-300) INJECTION 61% COMPARISON:  Colonoscopy report 02/19/2018.  Chest CT 07/26/07 FINDINGS: CT CHEST FINDINGS Cardiovascular: Ascending aortic dilatation is mild, including at 4.2 cm on image 30/2. Similar to minimally increased compared to 4.1 cm on 07/26/2007. Normal caliber of the transverse and descending aorta. Normal heart size, without pericardial effusion. No central pulmonary embolism, on this non-dedicated study. Mediastinum/Nodes: Left thyroidectomy. No supraclavicular adenopathy. No mediastinal or hilar adenopathy. Tiny hiatal hernia. Lungs/Pleura: No pleural fluid. Isolated, mildly spiculated pleural-based left lower lobe 9 x 7 mm pulmonary nodule on image 136/4, new since 2009. Possible subpleural left lower lobe 3 mm nodule versus area of pleural thickening on image 108/4. Musculoskeletal: No acute osseous abnormality. CT ABDOMEN PELVIS FINDINGS Hepatobiliary: Focal  steatosis adjacent the falciform ligament. Vague area of hypoattenuation or hypoenhancement in the posterior right hepatic lobe measures 1.6 cm on image 61/2. There is more wedge-shaped mild hypoenhancement peripheral to this including on image 61/2. Cholecystectomy, without biliary ductal dilatation. Pancreas: Normal, without mass or ductal dilatation. Spleen: Hypoattenuating splenic lesion of 1.9 cm anteriorly is present at 1.3 cm in 2009 and is likely a benign lesion such as a hemangioma or lymphangioma. Adrenals/Urinary Tract: Normal adrenal glands. Normal kidneys, without hydronephrosis. Normal urinary bladder. Stomach/Bowel: Normal remainder of the stomach. Low rectal wall thickening and asymmetric right-sided mucosal hyperenhancement, including on image 123/2. Small perirectal nodes, the largest of which measures 7 mm on image 118/2 (5 o'clock position). Normal terminal ileum and appendix.  Normal small bowel. Vascular/Lymphatic: Aortic atherosclerosis. No abdominal or pelvic  sidewall adenopathy. Reproductive: Retroverted uterus.  No adnexal mass. Other: No significant free fluid. Musculoskeletal: No acute osseous abnormality. IMPRESSION: 1. Rectal wall thickening, likely representing the primary lesion. Perirectal nodes of maximally 7 mm, suspicious for nodal metastasis. 2. Isolated 9 mm left lower lobe pulmonary nodule, new since 07/26/2007. Suspicious for pulmonary metastasis from 1 of the patient's primaries. Consider further evaluation with PET versus tissue sampling. 3. Right hepatic lobe hypoattenuation, including central more nodular component and peripheral more wedge-shaped component. Indeterminate. Differential considerations include a benign or malignant lesion with secondary altered perfusion. Given geographic appearance, focal steatosis is possible. This would also be better characterized with PET and especially if PET is not definitive, dedicated pre and post contrast abdominal MRI. 4.  Aortic Atherosclerosis (ICD10-I70.0). 5. Slight increase in ascending aortic dilatation. Recommend annual imaging followup by CTA or MRA. This recommendation follows 2010 ACCF/AHA/AATS/ACR/ASA/SCA/SCAI/SIR/STS/SVM Guidelines for the Diagnosis and Management of Patients with Thoracic Aortic Disease. Circulation. 2010; 121: [id number] Electronically Signed   By: Abigail Miyamoto M.D.   On: 02/21/2018 12:27       IMPRESSION/PLAN: 1. 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 Dr. Lisbeth Renshaw discusses the pathology findings and reviews the nature of rectal cancers. Dr. Lisbeth Renshaw recommends proceeding with the PET scan to rule out metastatic disease in the lungs.  He outlines the options of chemotherapy in the neoadjuvant versus adjuvant setting and the course of chemoRT which is recommended. We are awaiting Dr. Tomie China plan on timing for her IV chemotherapy. We discussed the risks, benefits, short, and long term effects of radiotherapy, and the patient is interested in proceeding. Dr. Lisbeth Renshaw  discusses the delivery and logistics of radiotherapy and anticipates a course of 5 1/2 weeks of radiotherapy. We will plan to follow up with the patient to coordinate simulation depending on the timing of her IV chemotherapy. She was not interested in signing consent yet at this time. She was given precautions to call sooner if she has questions or concerns.   In a visit lasting 60 minutes, greater than 50% of the time was spent face to face discussing her case, and coordinating the patient's care.   The above documentation reflects my direct findings during this shared patient visit. Please see the separate note by Dr. Lisbeth Renshaw on this date for the remainder of the patient's plan of care.    Carola Rhine, PAC

## 2018-03-02 ENCOUNTER — Encounter (HOSPITAL_COMMUNITY): Payer: Self-pay

## 2018-03-02 ENCOUNTER — Ambulatory Visit (HOSPITAL_COMMUNITY)
Admission: RE | Admit: 2018-03-02 | Discharge: 2018-03-02 | Disposition: A | Discharge status: Home / Self Care | Payer: 59 | Source: Ambulatory Visit | Attending: Obstetrics and Gynecology | Admitting: Obstetrics and Gynecology

## 2018-03-02 DIAGNOSIS — Z1231 Encounter for screening mammogram for malignant neoplasm of breast: Secondary | ICD-10-CM | POA: Insufficient documentation

## 2018-03-04 ENCOUNTER — Other Ambulatory Visit (HOSPITAL_COMMUNITY): Payer: Self-pay | Admitting: Hematology

## 2018-03-04 ENCOUNTER — Other Ambulatory Visit (HOSPITAL_COMMUNITY): Payer: Self-pay | Admitting: *Deleted

## 2018-03-04 DIAGNOSIS — C20 Malignant neoplasm of rectum: Secondary | ICD-10-CM

## 2018-03-04 MED ORDER — CAPECITABINE 500 MG PO TABS: 825.0000 mg/m2 | ORAL_TABLET | Freq: Two times a day (BID) | ORAL | 0 refills | Status: DC

## 2018-03-04 NOTE — Telephone Encounter (Signed)
Prescription for Xeloda sent electronically to [facility] outpatient pharmacy.

## 2018-03-05 ENCOUNTER — Ambulatory Visit (HOSPITAL_COMMUNITY): Payer: 59

## 2018-03-05 ENCOUNTER — Ambulatory Visit
Admission: RE | Admit: 2018-03-05 | Discharge: 2018-03-05 | Disposition: A | Discharge status: Home / Self Care | Payer: 59 | Source: Ambulatory Visit | Attending: Nurse Practitioner | Admitting: Nurse Practitioner

## 2018-03-05 ENCOUNTER — Telehealth (HOSPITAL_COMMUNITY): Payer: Self-pay | Admitting: Pharmacist

## 2018-03-05 ENCOUNTER — Other Ambulatory Visit (HOSPITAL_COMMUNITY): Payer: Self-pay | Admitting: *Deleted

## 2018-03-05 ENCOUNTER — Telehealth (HOSPITAL_COMMUNITY): Payer: Self-pay | Admitting: Pharmacy Technician

## 2018-03-05 DIAGNOSIS — C19 Malignant neoplasm of rectosigmoid junction: Secondary | ICD-10-CM

## 2018-03-05 DIAGNOSIS — R911 Solitary pulmonary nodule: Secondary | ICD-10-CM | POA: Insufficient documentation

## 2018-03-05 DIAGNOSIS — C20 Malignant neoplasm of rectum: Secondary | ICD-10-CM | POA: Diagnosis not present

## 2018-03-05 DIAGNOSIS — C787 Secondary malignant neoplasm of liver and intrahepatic bile duct: Secondary | ICD-10-CM | POA: Diagnosis not present

## 2018-03-05 LAB — GLUCOSE, CAPILLARY: Glucose-Capillary: 82 mg/dL (ref 70–99)

## 2018-03-05 MED ORDER — FLUDEOXYGLUCOSE F - 18 (FDG) INJECTION
9.4100 | Freq: Once | INTRAVENOUS | Status: AC | PRN
  Administered 2018-03-05: 9.41 via INTRAVENOUS

## 2018-03-05 NOTE — Telephone Encounter (Signed)
I have patient scheduled to get labs drawn tomorrow.

## 2018-03-05 NOTE — Telephone Encounter (Signed)
Oral Oncology Patient Advocate Encounter  Received notification from [employer] that prior authorization for Xeloda is required.  PA submitted on CoverMyMeds Key [id number]  Status is pending  Oral Oncology Clinic will continue to follow.  [place] Patient [place] Phone [phone] Fax [phone] 03/05/2018 3:52 PM

## 2018-03-05 NOTE — Telephone Encounter (Signed)
Oral Oncology Pharmacist Encounter  Received new prescription for Xeloda (capecitabine) for the treatment of Stage IIIB rectal adenocarcinoma in conjunction with RT, planned duration 5 1/2 weeks of RT.  Recommend checking a CMP prior to the start of Xeloda to check SCr and evaluation patient renal function. Prescription dose and frequency assessed.   Current medication list in Epic reviewed, no relevant DDIs with Xeloda identified.  Prescription has been e-scribed to the [facility] for benefits analysis and approval.  Oral Oncology Clinic will continue to follow for insurance authorization, copayment issues, initial counseling and start date.  Darl Pikes, PharmD, BCPS, [facility] Hematology/Oncology Clinical Pharmacist [facility] [phone]  03/05/2018 12:32 PM

## 2018-03-06 ENCOUNTER — Telehealth: Payer: Self-pay | Admitting: Radiation Oncology

## 2018-03-06 ENCOUNTER — Inpatient Hospital Stay (HOSPITAL_COMMUNITY): Payer: 59 | Attending: Hematology

## 2018-03-06 ENCOUNTER — Other Ambulatory Visit (HOSPITAL_COMMUNITY): Payer: Self-pay | Admitting: Nurse Practitioner

## 2018-03-06 DIAGNOSIS — C19 Malignant neoplasm of rectosigmoid junction: Secondary | ICD-10-CM | POA: Diagnosis not present

## 2018-03-06 DIAGNOSIS — C20 Malignant neoplasm of rectum: Secondary | ICD-10-CM

## 2018-03-06 LAB — CBC WITH DIFFERENTIAL/PLATELET
Abs Immature Granulocytes: 0.02 10*3/uL (ref 0.00–0.07)
Basophils Absolute: 0 10*3/uL (ref 0.0–0.1)
Basophils Relative: 0 %
Eosinophils Absolute: 0 10*3/uL (ref 0.0–0.5)
Eosinophils Relative: 0 %
HCT: 39.2 % (ref 36.0–46.0)
Hemoglobin: 13.3 g/dL (ref 12.0–15.0)
Immature Granulocytes: 0 %
Lymphocytes Relative: 20 %
Lymphs Abs: 1.1 10*3/uL (ref 0.7–4.0)
MCH: 31.9 pg (ref 26.0–34.0)
MCHC: 33.9 g/dL (ref 30.0–36.0)
MCV: 94 fL (ref 80.0–100.0)
Monocytes Absolute: 0.4 10*3/uL (ref 0.1–1.0)
Monocytes Relative: 6 %
Neutro Abs: 4 10*3/uL (ref 1.7–7.7)
Neutrophils Relative %: 74 %
Platelets: 178 10*3/uL (ref 150–400)
RBC: 4.17 MIL/uL (ref 3.87–5.11)
RDW: 13 % (ref 11.5–15.5)
WBC: 5.5 10*3/uL (ref 4.0–10.5)
nRBC: 0 % (ref 0.0–0.2)

## 2018-03-06 LAB — COMPREHENSIVE METABOLIC PANEL
ALT: 44 U/L (ref 0–44)
AST: 37 U/L (ref 15–41)
Albumin: 4.9 g/dL (ref 3.5–5.0)
Alkaline Phosphatase: 72 U/L (ref 38–126)
Anion gap: 9 (ref 5–15)
BUN: 13 mg/dL (ref 6–20)
CALCIUM: 9.4 mg/dL (ref 8.9–10.3)
CO2: 25 mmol/L (ref 22–32)
Chloride: 104 mmol/L (ref 98–111)
Creatinine, Ser: 0.77 mg/dL (ref 0.44–1.00)
GFR calc Af Amer: >60 mL/min (ref >60)
GFR calc non Af Amer: >60 mL/min (ref >60)
Glucose, Bld: 108 mg/dL — ABNORMAL HIGH (ref 70–99)
Potassium: 3.2 mmol/L — ABNORMAL LOW (ref 3.5–5.1)
Sodium: 138 mmol/L (ref 135–145)
Total Bilirubin: 2.6 mg/dL — ABNORMAL HIGH (ref 0.3–1.2)
Total Protein: 7.9 g/dL (ref 6.5–8.1)

## 2018-03-06 NOTE — Telephone Encounter (Signed)
Oral Oncology Patient Advocate Encounter  Prior Authorization for Xeloda (Capecitabine) has been approved.    PA# [id number] Effective dates: 03/05/18 through 03/05/19 (Max of 12 fills)  Patients co-pay is $30.87.  Oral Oncology Clinic will continue to follow.   [place] Patient [place] Phone [phone] Fax [phone] 03/06/2018 10:17 AM

## 2018-03-06 NOTE — Telephone Encounter (Signed)
I called the patient to review her PET imaging. She was unavailable to take my call and I left her a voicemail to return my call.

## 2018-03-07 ENCOUNTER — Ambulatory Visit (HOSPITAL_COMMUNITY): Payer: 59

## 2018-03-07 ENCOUNTER — Encounter (HOSPITAL_COMMUNITY): Payer: Self-pay | Admitting: Hematology

## 2018-03-07 ENCOUNTER — Inpatient Hospital Stay (HOSPITAL_COMMUNITY): Payer: 59 | Attending: Hematology | Admitting: Hematology

## 2018-03-07 VITALS — BP 139/90 | HR 116 | Temp 98.4°F | Resp 18 | Wt 177.2 lb

## 2018-03-07 DIAGNOSIS — Z87891 Personal history of nicotine dependence: Secondary | ICD-10-CM | POA: Insufficient documentation

## 2018-03-07 DIAGNOSIS — Z85850 Personal history of malignant neoplasm of thyroid: Secondary | ICD-10-CM | POA: Diagnosis not present

## 2018-03-07 DIAGNOSIS — C20 Malignant neoplasm of rectum: Secondary | ICD-10-CM | POA: Diagnosis not present

## 2018-03-07 DIAGNOSIS — Z85820 Personal history of malignant melanoma of skin: Secondary | ICD-10-CM | POA: Diagnosis not present

## 2018-03-07 DIAGNOSIS — Z79899 Other long term (current) drug therapy: Secondary | ICD-10-CM | POA: Diagnosis not present

## 2018-03-07 NOTE — Progress Notes (Signed)
[place], [place] [address]   CLINIC:  Medical Oncology/Hematology  PCP:  [name], MD [address] [place] [address] [phone]   REASON FOR VISIT: Follow-up for rectal cancer  CURRENT THERAPY: Surgery and chemoradiation    INTERVAL HISTORY:  Ms. Nay 52 y.o. female returns for routine follow-up for rectal cancer. She is here today with her husband. She has rectal discomfort but not pain. She is now having regular bowel movements. She denies any new pain. Denies any nausea, vomiting, or diarrhea. Denies any fevers or recent infections. Denies any bleeding or easy bruising. She reports her appetite at 25% and energy level at 50%. She lives at home with her husband and performs all her own ADLs activities. She still works a full time job at [employer].     REVIEW OF SYSTEMS:  Review of Systems  Constitutional: Positive for appetite change and fatigue.  All other systems reviewed and are negative.    PAST MEDICAL/SURGICAL HISTORY:  Past Medical History:  Diagnosis Date  . Anxiety   . Cancer [facility]) 2013   thyroid  . Endometrial polyp   . Endometrial thickening on ultra sound   . GERD (gastroesophageal reflux disease)   . H/O Hashimoto thyroiditis   . 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 Hypothyroidism    Past Surgical History:  Procedure Laterality Date  . BIOPSY  02/19/2018   Procedure: BIOPSY;  Surgeon: Rogene Houston, MD;  [facility];  Service: Endoscopy;;  rectum  . COLONOSCOPY N/A 08/20/2015   Procedure: COLONOSCOPY;  Surgeon: Rogene Houston, MD;  [facility];  Service: Endoscopy;  Laterality: N/A;  730  . [place] ENDOMETRIAL ABLATION  08-13-2004  . FLEXIBLE SIGMOIDOSCOPY N/A 02/19/2018   Procedure: FLEXIBLE SIGMOIDOSCOPY;  Surgeon: Rogene Houston, MD;  [facility];  Service: Endoscopy;  Laterality: N/A;  . HYSTEROSCOPY W/D&C N/A 04/30/2015   Procedure: DILATATION AND CURETTAGE / INTENDED HYSTEROSCOPY;  Surgeon: Dian Queen, MD;  Location: [place];  Service: Gynecology;  Laterality: N/A;  . LAPAROSCOPIC CHOLECYSTECTOMY  04/1996  . POLYPECTOMY  08/20/2015   Procedure: POLYPECTOMY;  Surgeon: Rogene Houston, MD;  [facility];  Service: Endoscopy;;  Splenic flexure polypectomy  . POLYPECTOMY  02/19/2018   Procedure: POLYPECTOMY;  Surgeon: Rogene Houston, MD;  [facility];  Service: Endoscopy;;  rectum  . REDUCTION INCARCERATED UTERUS  06-20-2000   intrauterine preg. 13 wks /  urinary retention  . THYROID LOBECTOMY  11/24/2011   Procedure: THYROID LOBECTOMY;  Surgeon: Earnstine Regal, MD;  Location: [facility];  Service: General;  Laterality: Left;  Left Thyroid Lobectomy  . TUBAL LIGATION  2002     SOCIAL HISTORY:  Social History   Socioeconomic History  . Marital status: Married    Spouse name: Not on file  . Number of children: 4  . Years of education: Not on file  . Highest education level: Not on file  Occupational History  . Not on file  Social Needs  . Financial resource strain: Not hard at all  . Food insecurity:    Worry: Never true    Inability: Never true  . Transportation needs:    Medical: No    Non-medical: No  Tobacco Use  .  Smoking status: Former Smoker    Packs/day: 0.25    Years: 10.00    Pack years: 2.50    Types: Cigarettes    Last attempt to quit: 10/31/1991    Years since quitting: 26.3  . Smokeless tobacco: Never Used  Substance and Sexual Activity  . Alcohol use: No  . Drug use: No  . Sexual activity: Not Currently  Lifestyle  . Physical activity:    Days per week: 0 days    Minutes per session: 0 min  . Stress: To some extent  Relationships  . Social connections:    Talks on phone: More than three times a week    Gets together:  Twice a week    Attends religious service: More than 4 times per year    Active member of club or organization: Yes    Attends meetings of clubs or organizations: More than 4 times per year    Relationship status: Married  . Intimate partner violence:    Fear of current or ex partner: No    Emotionally abused: No    Physically abused: No    Forced sexual activity: No  Other Topics Concern  . Not on file  Social History Narrative  . Not on file    FAMILY HISTORY:  Family History  Problem Relation Age of Onset  . Hypertension Mother   . Hypertension Father   . Prostate cancer Father   . Cancer Maternal Aunt        spine/back  . Cancer Paternal Grandfather        lung  . Healthy Son   . Healthy Son   . Healthy Son   . Healthy Son   . Colon cancer Neg Hx     CURRENT MEDICATIONS:  Outpatient Encounter Medications as of 03/07/2018  Medication Sig Note  . ALPRAZolam (XANAX) 0.5 MG tablet Take 1 tablet (0.5 mg total) by mouth 2 (two) times daily as needed for anxiety.   . calcium carbonate (TUMS - DOSED IN MG ELEMENTAL CALCIUM) 500 MG chewable tablet Chew 1 tablet by mouth 3 (three) times daily as needed for indigestion or heartburn.  02/12/2018: rarely  . capecitabine (XELODA) 500 MG tablet Take 3 tablets (1,500 mg total) by mouth 2 (two) times daily after a meal. M-F during XRT   . fluticasone (CUTIVATE) 0.05 % cream Apply 1 application topically 2 (two) times daily as needed. Skin irritation/rash   . ibuprofen (ADVIL,MOTRIN) 200 MG tablet Take 400 mg by mouth every 8 (eight) hours as needed (for pain.).    Marland Kitchen ketotifen (ALAWAY) 0.025 % ophthalmic solution Place 1 drop into both eyes daily as needed (for allergy eyes.).   Marland Kitchen progesterone (PROMETRIUM) 100 MG capsule Take 100 mg by mouth at bedtime.   Marland Kitchen thyroid (ARMOUR) 90 MG tablet Take 90 mg by mouth daily before breakfast.     No facility-administered encounter medications on file as of 03/07/2018.      PHYSICAL EXAM:  ECOG  Performance status: 1  Vitals:   03/07/18 1349  BP: 139/90  Pulse: (!) 116  Resp: 18  Temp: 98.4 F (36.9 C)  SpO2: 98%   Filed Weights   03/07/18 1349  Weight: 177 lb 3.2 oz (80.4 kg)    Physical Exam  Constitutional: She is oriented to person, place, and time. She appears well-developed and well-nourished.  Musculoskeletal: Normal range of motion.  Neurological: She is alert and oriented to person, place, and time.  Skin: Skin  is warm and dry.  Psychiatric: She has a normal mood and affect. Her behavior is normal. Judgment and thought content normal.     LABORATORY DATA:  I have reviewed the labs as listed.  CBC    Component Value Date/Time   WBC 5.5 03/06/2018 0957   RBC 4.17 03/06/2018 0957   HGB 13.3 03/06/2018 0957   HGB 13.5 11/03/2015 0841   HCT 39.2 03/06/2018 0957   HCT 39.7 11/03/2015 0841   PLT 178 03/06/2018 0957   PLT 165 11/03/2015 0841   MCV 94.0 03/06/2018 0957   MCV 95 11/03/2015 0841   MCH 31.9 03/06/2018 0957   MCHC 33.9 03/06/2018 0957   RDW 13.0 03/06/2018 0957   RDW 13.2 11/03/2015 0841   LYMPHSABS 1.1 03/06/2018 0957   LYMPHSABS 1.4 11/03/2015 0841   MONOABS 0.4 03/06/2018 0957   EOSABS 0.0 03/06/2018 0957   EOSABS 0.1 11/03/2015 0841   BASOSABS 0.0 03/06/2018 0957   BASOSABS 0.0 11/03/2015 0841   CMP Latest Ref Rng & Units 03/06/2018 02/07/2018 11/03/2015  Glucose 70 - 99 mg/dL 108(H) 95 90  BUN 6 - 20 mg/dL 13 - 10  Creatinine 0.44 - 1.00 mg/dL 0.77 - 0.84  Sodium 135 - 145 mmol/L 138 - 140  Potassium 3.5 - 5.1 mmol/L 3.2(L) - 4.1  Chloride 98 - 111 mmol/L 104 - 101  CO2 22 - 32 mmol/L 25 - 20  Calcium 8.9 - 10.3 mg/dL 9.4 - 9.1  Total Protein 6.5 - 8.1 g/dL 7.9 - 6.9  Total Bilirubin 0.3 - 1.2 mg/dL 2.6(H) - 2.1(H)  Alkaline Phos 38 - 126 U/L 72 - 76  AST 15 - 41 U/L 37 - 19  ALT 0 - 44 U/L 44 - 22       DIAGNOSTIC IMAGING:  I have independently reviewed the scans and discussed with the patient.   I have reviewed Francene Finders, NP's note and agree with the documentation.  I personally performed a face-to-face visit, made revisions and my assessment and plan is as follows.     ASSESSMENT & PLAN:   Rectal cancer ([place]) 1.  T3 N1/2 rectal adenocarcinoma: - 1 year of intermittent rectal bleeding, reports 20 pounds of weight loss in the last 3 months, although some of it is intentional. - Colonoscopy on 02/19/2018 shows rectal mass, 2 to 4 cm from the anal verge, normal sigmoid colon.  Biopsies consistent with adenocarcinoma. - MRI of the pelvis shows rectal adenocarcinoma, T stage-T3, N stage- N1-equivocal N2 nodes.  Maximum extension beyond muscularis propria is 6 mm (advanced T3).  Circumferential resection margin is 7 mm.  Right obturator node 6 mm.  Right external iliac node at 8 mm.  Craniocaudal extent of the tumor is 4.4 cm.  Approximately 180 degree circumferential.  Shortest distance from the distal tumor to anal sphincter is 2.1 cm.  Location from anal verge, 7.2 cm to the middle of the tumor. - CT CAP on 02/21/2018 shows 9 x 7 mm left lower lobe pulmonary nodule, new since 2009.  Possible subpleural left lower lobe 3 mm nodule versus area of pleural thickening.  Vague area of hypoattenuation in the posterior right hepatic lobe measuring 1.6 cm.  Differential includes benign/malignant lesion with altered perfusion.  Focal steatosis is possible. - We talked about pathology and scan findings in detail.  She has a clear circumferential resection margin, greater than 1 mm from mesorectal fascia. - She was evaluated by Dr. Marcello Moores.  I called and  talked to Dr. Marcello Moores last week.  She did not think that neoadjuvant chemotherapy is necessary for clear margins. -We talked about the PET CT scan dated 03/05/2018 which shows hypermetabolic lesion in the right lobe of the liver.  There is also small hypermetabolic lesion in the left lower lobe of the lung. -We have arranged for biopsy of the liver lesion.  This will be done  on Friday.  I have also recommended MRI of the liver with and without contrast. - We will see her back after the pathology report is available.  2.  Thyroid cancer: -She had a left thyroidectomy done about 6 years ago.  She is on Synthroid at this time. -We plan to check her TSH, thyroglobulin and antithyroglobulin antibodies in later part of this month.      Orders placed this encounter:  Orders Placed This Encounter  Procedures  . MR LIVER W [place], [place] [phone]

## 2018-03-07 NOTE — Assessment & Plan Note (Signed)
1.  T3 N1/2 rectal adenocarcinoma: - 1 year of intermittent rectal bleeding, reports 20 pounds of weight loss in the last 3 months, although some of it is intentional. - Colonoscopy on 02/19/2018 shows rectal mass, 2 to 4 cm from the anal verge, normal sigmoid colon.  Biopsies consistent with adenocarcinoma. - MRI of the pelvis shows rectal adenocarcinoma, T stage-T3, N stage- N1-equivocal N2 nodes.  Maximum extension beyond muscularis propria is 6 mm (advanced T3).  Circumferential resection margin is 7 mm.  Right obturator node 6 mm.  Right external iliac node at 8 mm.  Craniocaudal extent of the tumor is 4.4 cm.  Approximately 180 degree circumferential.  Shortest distance from the distal tumor to anal sphincter is 2.1 cm.  Location from anal verge, 7.2 cm to the middle of the tumor. - CT CAP on 02/21/2018 shows 9 x 7 mm left lower lobe pulmonary nodule, new since 2009.  Possible subpleural left lower lobe 3 mm nodule versus area of pleural thickening.  Vague area of hypoattenuation in the posterior right hepatic lobe measuring 1.6 cm.  Differential includes benign/malignant lesion with altered perfusion.  Focal steatosis is possible. - We talked about pathology and scan findings in detail.  She has a clear circumferential resection margin, greater than 1 mm from mesorectal fascia. - She was evaluated by Dr. Marcello Moores.  I called and talked to Dr. Marcello Moores last week.  She did not think that neoadjuvant chemotherapy is necessary for clear margins. -We talked about the PET CT scan dated 03/05/2018 which shows hypermetabolic lesion in the right lobe of the liver.  There is also small hypermetabolic lesion in the left lower lobe of the lung. -We have arranged for biopsy of the liver lesion.  This will be done on Friday.  I have also recommended MRI of the liver with and without contrast. - We will see her back after the pathology report is available.  2.  Thyroid cancer: -She had a left thyroidectomy done about  6 years ago.  She is on Synthroid at this time. -We plan to check her TSH, thyroglobulin and antithyroglobulin antibodies in later part of this month.

## 2018-03-07 NOTE — Patient Instructions (Signed)
[facility]  Discharge Instructions:   _______________________________________________________________  Thank you for choosing [facility] to provide your oncology and hematology care.  To afford each patient quality time with our providers, please arrive at least 15 minutes before your scheduled appointment.  You need to re-schedule your appointment if you arrive 10 or more minutes late.  We strive to give you quality time with our providers, and arriving late affects you and other patients whose appointments are after yours.  Also, if you no show three or more times for appointments you may be dismissed from the clinic.  Again, thank you for choosing [facility]. Our hope is that these requests will allow you access to exceptional care and in a timely manner. _______________________________________________________________  If you have questions after your visit, please contact our office at [phone] between the hours of 8:30 a.m. and 5:00 p.m. Voicemails left after 4:30 p.m. will not be returned until the following business day. _______________________________________________________________  For prescription refill requests, have your pharmacy contact our office. _______________________________________________________________  Recommendations made by the consultant and any test results will be sent to your referring physician. _______________________________________________________________ 

## 2018-03-08 ENCOUNTER — Ambulatory Visit (HOSPITAL_COMMUNITY)
Admission: RE | Admit: 2018-03-08 | Discharge: 2018-03-08 | Disposition: A | Discharge status: Home / Self Care | Payer: 59 | Source: Ambulatory Visit | Attending: Nurse Practitioner | Admitting: Nurse Practitioner

## 2018-03-08 ENCOUNTER — Other Ambulatory Visit: Payer: Self-pay | Admitting: Radiology

## 2018-03-08 DIAGNOSIS — C20 Malignant neoplasm of rectum: Secondary | ICD-10-CM | POA: Insufficient documentation

## 2018-03-08 DIAGNOSIS — N281 Cyst of kidney, acquired: Secondary | ICD-10-CM | POA: Insufficient documentation

## 2018-03-08 DIAGNOSIS — Z79899 Other long term (current) drug therapy: Secondary | ICD-10-CM | POA: Insufficient documentation

## 2018-03-08 DIAGNOSIS — D734 Cyst of spleen: Secondary | ICD-10-CM | POA: Insufficient documentation

## 2018-03-08 DIAGNOSIS — K769 Liver disease, unspecified: Secondary | ICD-10-CM | POA: Diagnosis present

## 2018-03-08 DIAGNOSIS — Z85850 Personal history of malignant neoplasm of thyroid: Secondary | ICD-10-CM | POA: Diagnosis not present

## 2018-03-08 DIAGNOSIS — Z8249 Family history of ischemic heart disease and other diseases of the circulatory system: Secondary | ICD-10-CM | POA: Diagnosis not present

## 2018-03-08 DIAGNOSIS — E890 Postprocedural hypothyroidism: Secondary | ICD-10-CM | POA: Diagnosis not present

## 2018-03-08 DIAGNOSIS — Z79890 Hormone replacement therapy (postmenopausal): Secondary | ICD-10-CM | POA: Insufficient documentation

## 2018-03-08 DIAGNOSIS — C787 Secondary malignant neoplasm of liver and intrahepatic bile duct: Secondary | ICD-10-CM | POA: Diagnosis not present

## 2018-03-08 DIAGNOSIS — C229 Malignant neoplasm of liver, not specified as primary or secondary: Secondary | ICD-10-CM | POA: Diagnosis not present

## 2018-03-08 DIAGNOSIS — F419 Anxiety disorder, unspecified: Secondary | ICD-10-CM | POA: Insufficient documentation

## 2018-03-08 MED ORDER — GADOBUTROL 1 MMOL/ML IV SOLN
7.5000 mL | Freq: Once | INTRAVENOUS | Status: AC | PRN
  Administered 2018-03-08: 7.5 mL via INTRAVENOUS

## 2018-03-09 ENCOUNTER — Other Ambulatory Visit: Payer: Self-pay

## 2018-03-09 ENCOUNTER — Ambulatory Visit (HOSPITAL_COMMUNITY): Payer: 59 | Admitting: Hematology

## 2018-03-09 ENCOUNTER — Encounter (HOSPITAL_COMMUNITY): Payer: Self-pay

## 2018-03-09 ENCOUNTER — Ambulatory Visit (HOSPITAL_COMMUNITY)
Admission: RE | Admit: 2018-03-09 | Discharge: 2018-03-09 | Disposition: A | Discharge status: Home / Self Care | Payer: 59 | Source: Ambulatory Visit | Attending: Nurse Practitioner | Admitting: Nurse Practitioner

## 2018-03-09 DIAGNOSIS — Z8249 Family history of ischemic heart disease and other diseases of the circulatory system: Secondary | ICD-10-CM | POA: Diagnosis not present

## 2018-03-09 DIAGNOSIS — D734 Cyst of spleen: Secondary | ICD-10-CM | POA: Diagnosis not present

## 2018-03-09 DIAGNOSIS — Z85850 Personal history of malignant neoplasm of thyroid: Secondary | ICD-10-CM | POA: Diagnosis not present

## 2018-03-09 DIAGNOSIS — K7689 Other specified diseases of liver: Secondary | ICD-10-CM | POA: Diagnosis not present

## 2018-03-09 DIAGNOSIS — E890 Postprocedural hypothyroidism: Secondary | ICD-10-CM | POA: Diagnosis not present

## 2018-03-09 DIAGNOSIS — Z79899 Other long term (current) drug therapy: Secondary | ICD-10-CM | POA: Diagnosis not present

## 2018-03-09 DIAGNOSIS — F419 Anxiety disorder, unspecified: Secondary | ICD-10-CM | POA: Diagnosis not present

## 2018-03-09 DIAGNOSIS — C229 Malignant neoplasm of liver, not specified as primary or secondary: Secondary | ICD-10-CM | POA: Diagnosis not present

## 2018-03-09 DIAGNOSIS — C20 Malignant neoplasm of rectum: Secondary | ICD-10-CM

## 2018-03-09 DIAGNOSIS — Z79890 Hormone replacement therapy (postmenopausal): Secondary | ICD-10-CM | POA: Diagnosis not present

## 2018-03-09 HISTORY — PX: IR US GUIDE BX ASP/DRAIN: IMG2392

## 2018-03-09 LAB — CBC WITH DIFFERENTIAL/PLATELET
ABS IMMATURE GRANULOCYTES: 0.03 10*3/uL (ref 0.00–0.07)
BASOS PCT: 1 %
Basophils Absolute: 0 10*3/uL (ref 0.0–0.1)
Eosinophils Absolute: 0 10*3/uL (ref 0.0–0.5)
Eosinophils Relative: 1 %
HCT: 38.3 % (ref 36.0–46.0)
Hemoglobin: 12.7 g/dL (ref 12.0–15.0)
Immature Granulocytes: 1 %
Lymphocytes Relative: 23 %
Lymphs Abs: 1 10*3/uL (ref 0.7–4.0)
MCH: 31.1 pg (ref 26.0–34.0)
MCHC: 33.2 g/dL (ref 30.0–36.0)
MCV: 93.9 fL (ref 80.0–100.0)
Monocytes Absolute: 0.3 10*3/uL (ref 0.1–1.0)
Monocytes Relative: 8 %
Neutro Abs: 2.9 10*3/uL (ref 1.7–7.7)
Neutrophils Relative %: 66 %
PLATELETS: 162 10*3/uL (ref 150–400)
RBC: 4.08 MIL/uL (ref 3.87–5.11)
RDW: 12.7 % (ref 11.5–15.5)
WBC: 4.3 10*3/uL (ref 4.0–10.5)
nRBC: 0 % (ref 0.0–0.2)

## 2018-03-09 LAB — COMPREHENSIVE METABOLIC PANEL
ALT: 31 U/L (ref 0–44)
AST: 26 U/L (ref 15–41)
Albumin: 4.4 g/dL (ref 3.5–5.0)
Alkaline Phosphatase: 59 U/L (ref 38–126)
Anion gap: 16 — ABNORMAL HIGH (ref 5–15)
BUN: 15 mg/dL (ref 6–20)
CO2: 23 mmol/L (ref 22–32)
Calcium: 9.3 mg/dL (ref 8.9–10.3)
Chloride: 99 mmol/L (ref 98–111)
Creatinine, Ser: 0.82 mg/dL (ref 0.44–1.00)
GFR calc Af Amer: >60 mL/min (ref >60)
GFR calc non Af Amer: >60 mL/min (ref >60)
Glucose, Bld: 99 mg/dL (ref 70–99)
Potassium: 3.2 mmol/L — ABNORMAL LOW (ref 3.5–5.1)
Sodium: 138 mmol/L (ref 135–145)
Total Bilirubin: 2.4 mg/dL — ABNORMAL HIGH (ref 0.3–1.2)
Total Protein: 7.3 g/dL (ref 6.5–8.1)

## 2018-03-09 LAB — PROTIME-INR
INR: 0.98
Prothrombin Time: 12.9 seconds (ref 11.4–15.2)

## 2018-03-09 MED ORDER — LIDOCAINE HCL 1 % IJ SOLN
INTRAMUSCULAR | Status: AC
  Filled 2018-03-09: qty 20

## 2018-03-09 MED ORDER — LIDOCAINE-EPINEPHRINE (PF) 1 %-1:200000 IJ SOLN
INTRAMUSCULAR | Status: AC
  Filled 2018-03-09: qty 30

## 2018-03-09 MED ORDER — FENTANYL CITRATE (PF) 100 MCG/2ML IJ SOLN
INTRAMUSCULAR | Status: AC
  Filled 2018-03-09: qty 2

## 2018-03-09 MED ORDER — MIDAZOLAM HCL 2 MG/2ML IJ SOLN
INTRAMUSCULAR | Status: AC | PRN
  Administered 2018-03-09 (×2): 1 mg via INTRAVENOUS

## 2018-03-09 MED ORDER — FENTANYL CITRATE (PF) 100 MCG/2ML IJ SOLN
INTRAMUSCULAR | Status: AC | PRN
  Administered 2018-03-09 (×2): 50 ug via INTRAVENOUS

## 2018-03-09 MED ORDER — GELATIN ABSORBABLE 12-7 MM EX MISC
CUTANEOUS | Status: AC
  Filled 2018-03-09: qty 1

## 2018-03-09 MED ORDER — SODIUM CHLORIDE 0.9 % IV SOLN
INTRAVENOUS | Status: AC | PRN
  Administered 2018-03-09: 10 mL/h via INTRAVENOUS

## 2018-03-09 MED ORDER — MIDAZOLAM HCL 2 MG/2ML IJ SOLN
INTRAMUSCULAR | Status: AC
  Filled 2018-03-09: qty 2

## 2018-03-09 MED ORDER — LIDOCAINE-EPINEPHRINE 2 %-1:100000 IJ SOLN
INTRAMUSCULAR | Status: AC | PRN
  Administered 2018-03-09: 10 mL

## 2018-03-09 MED ORDER — HYDROCODONE-ACETAMINOPHEN 5-325 MG PO TABS
1.0000 | ORAL_TABLET | ORAL | Status: AC
  Administered 2018-03-09: 1 via ORAL
  Filled 2018-03-09: qty 1

## 2018-03-09 MED ORDER — SODIUM CHLORIDE 0.9 % IV SOLN
INTRAVENOUS | Status: DC
  Administered 2018-03-09: 08:00:00 via INTRAVENOUS

## 2018-03-09 NOTE — H&P (Signed)
Chief Complaint: Patient was seen in consultation today for liver lesion biopsy at the request of Grady L  Referring Physician(s): Lockamy,Randi L Dr Delton Coombes  Supervising Physician: Sandi Mariscal  Patient Status: [facility] - Out-pt  History of Present Illness: Samantha Reynolds is a 52 y.o. female   Hx Thyroid Ca 2013 New dx rectal cancer Nov 2019 To start treatment soon with Dr Delton Coombes  +PET: right hepatic lobe and left pulmonary nodule  MR yesterday: IMPRESSION: 1. 1.4 cm lesion in the posterior right hepatic lobe restricts diffusion and shows relatively diffuse delayed enhancement. Given the hypermetabolism seen in this lesion on PET-CT, the finding remains highly concerning for metastatic disease. 2. 5 mm focus of delayed hyperenhancement in the lateral segment left liver, nonspecific, but as a second metastatic deposit is not be excluded, close attention will be required. 3. Complex cyst anterior spleen. 4. Tiny simple cyst interpolar right kidney.  Scheduled now for liver lesion biopsy   Past Medical History:  Diagnosis Date  . Anxiety   . Cancer [facility]) 2013   thyroid  . Endometrial polyp   . Endometrial thickening on ultra sound   . GERD (gastroesophageal reflux disease)   . H/O Hashimoto thyroiditis   . 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 Hypothyroidism     Past Surgical History:  Procedure Laterality Date  . BIOPSY  02/19/2018   Procedure: BIOPSY;  Surgeon: Rogene Houston, MD;  [facility]: [facility];  Service: Endoscopy;;  rectum  . COLONOSCOPY N/A 08/20/2015   Procedure: COLONOSCOPY;  Surgeon: Rogene Houston, MD;  Location: [facility];  Service: Endoscopy;  Laterality: N/A;  730  . [place] ENDOMETRIAL ABLATION  08-13-2004  . FLEXIBLE SIGMOIDOSCOPY N/A 02/19/2018   Procedure: FLEXIBLE SIGMOIDOSCOPY;   Surgeon: Rogene Houston, MD;  Location: [facility];  Service: Endoscopy;  Laterality: N/A;  . HYSTEROSCOPY W/D&C N/A 04/30/2015   Procedure: DILATATION AND CURETTAGE / INTENDED HYSTEROSCOPY;  Surgeon: Dian Queen, MD;  Location: [place];  Service: Gynecology;  Laterality: N/A;  . LAPAROSCOPIC CHOLECYSTECTOMY  04/1996  . POLYPECTOMY  08/20/2015   Procedure: POLYPECTOMY;  Surgeon: Rogene Houston, MD;  [facility]: [facility];  Service: Endoscopy;;  Splenic flexure polypectomy  . POLYPECTOMY  02/19/2018   Procedure: POLYPECTOMY;  Surgeon: Rogene Houston, MD;  Location: [facility];  Service: Endoscopy;;  rectum  . REDUCTION INCARCERATED UTERUS  06-20-2000   intrauterine preg. 13 wks /  urinary retention  . THYROID LOBECTOMY  11/24/2011   Procedure: THYROID LOBECTOMY;  Surgeon: Earnstine Regal, MD;  Location: [facility];  Service: General;  Laterality: Left;  Left Thyroid Lobectomy  . TUBAL LIGATION  2002    Allergies: Demerol [meperidine]; Penicillins; and Other  Medications: Prior to Admission medications   Medication Sig Start Date End Date Taking? Authorizing Provider  ALPRAZolam Duanne Moron) 0.5 MG tablet Take 1 tablet (0.5 mg total) by mouth 2 (two) times daily as needed for anxiety. 02/21/18  Yes Luking, Scott A, MD  fluticasone (CUTIVATE) 0.05 % cream Apply 1 application topically 2 (two) times daily as needed. Skin irritation/rash 02/07/18  Yes [provider]  ibuprofen (ADVIL,MOTRIN) 200 MG tablet Take 400 mg by mouth every 8 (eight) hours as needed (for pain.).    Yes [provider]  ketotifen (ALAWAY) 0.025 % ophthalmic solution Place  1 drop into both eyes daily as needed (for allergy eyes.).   Yes [provider]  progesterone (PROMETRIUM) 100 MG capsule Take 100 mg by mouth at bedtime.   Yes [provider]  thyroid (ARMOUR) 90 MG tablet Take 90 mg by mouth daily before breakfast.    Yes [provider]  calcium  carbonate (TUMS - DOSED IN MG ELEMENTAL CALCIUM) 500 MG chewable tablet Chew 1 tablet by mouth 3 (three) times daily as needed for indigestion or heartburn.     [provider]  capecitabine (XELODA) 500 MG tablet Take 3 tablets (1,500 mg total) by mouth 2 (two) times daily after a meal. M-F during XRT 03/04/18   Derek Jack, MD     Family History  Problem Relation Age of Onset  . Hypertension Mother   . Hypertension Father   . Prostate cancer Father   . Cancer Maternal Aunt        spine/back  . Cancer Paternal Grandfather        lung  . Healthy Son   . Healthy Son   . Healthy Son   . Healthy Son   . Colon cancer Neg Hx     Social History   Socioeconomic History  . Marital status: Married    Spouse name: Not on file  . Number of children: 4  . Years of education: Not on file  . Highest education level: Not on file  Occupational History  . Not on file  Social Needs  . Financial resource strain: Not hard at all  . Food insecurity:    Worry: Never true    Inability: Never true  . Transportation needs:    Medical: No    Non-medical: No  Tobacco Use  . Smoking status: Former Smoker    Packs/day: 0.25    Years: 10.00    Pack years: 2.50    Types: Cigarettes    Last attempt to quit: 10/31/1991    Years since quitting: 26.3  . Smokeless tobacco: Never Used  Substance and Sexual Activity  . Alcohol use: No  . Drug use: No  . Sexual activity: Not Currently  Lifestyle  . Physical activity:    Days per week: 0 days    Minutes per session: 0 min  . Stress: To some extent  Relationships  . Social connections:    Talks on phone: More than three times a week    Gets together: Twice a week    Attends religious service: More than 4 times per year    Active member of club or organization: Yes    Attends meetings of clubs or organizations: More than 4 times per year    Relationship status: Married  Other Topics Concern  . Not on file  Social History  Narrative  . Not on file    Review of Systems: A 12 point ROS discussed and pertinent positives are indicated in the HPI above.  All other systems are negative.  Review of Systems  Constitutional: Positive for activity change, appetite change, fatigue and unexpected weight change.  Respiratory: Negative for shortness of breath.   Gastrointestinal: Positive for abdominal pain, anal bleeding and rectal pain.  Neurological: Positive for weakness.  Psychiatric/Behavioral: Negative for behavioral problems and confusion.    Vital Signs: BP 127/87   Pulse (!) 110   Temp 98.8 F (37.1 C)   Resp 20   Ht 5\' 7"  (1.702 m)   Wt 175 lb (79.4 kg)  LMP 02/16/2015 (Approximate)   SpO2 100%   BMI 27.41 kg/m   Physical Exam Vitals signs reviewed.  Constitutional:      Appearance: Normal appearance.  Cardiovascular:     Rate and Rhythm: Normal rate and regular rhythm.  Pulmonary:     Effort: Pulmonary effort is normal.     Breath sounds: Normal breath sounds.  Abdominal:     General: Bowel sounds are normal.     Palpations: Abdomen is soft.     Tenderness: There is abdominal tenderness.  Musculoskeletal: Normal range of motion.  Skin:    General: Skin is warm and dry.  Neurological:     General: No focal deficit present.     Mental Status: She is alert and oriented to person, place, and time.  Psychiatric:        Mood and Affect: Mood normal.        Behavior: Behavior normal.        Thought Content: Thought content normal.        Judgment: Judgment normal.     Imaging: Ct Chest W Contrast  Result Date: 02/21/2018 CLINICAL DATA:  Rectal cancer on colonoscopy. Hematochezia for 1 year. Thyroid cancer with thyroidectomy in 2013. EXAM: CT CHEST, ABDOMEN, AND PELVIS WITH CONTRAST TECHNIQUE: Multidetector CT imaging of the chest, abdomen and pelvis was performed following the standard protocol during bolus administration of intravenous contrast. CONTRAST:  169mL ISOVUE-300 IOPAMIDOL  (ISOVUE-300) INJECTION 61% COMPARISON:  Colonoscopy report 02/19/2018.  Chest CT 07/26/07 FINDINGS: CT CHEST FINDINGS Cardiovascular: Ascending aortic dilatation is mild, including at 4.2 cm on image 30/2. Similar to minimally increased compared to 4.1 cm on 07/26/2007. Normal caliber of the transverse and descending aorta. Normal heart size, without pericardial effusion. No central pulmonary embolism, on this non-dedicated study. Mediastinum/Nodes: Left thyroidectomy. No supraclavicular adenopathy. No mediastinal or hilar adenopathy. Tiny hiatal hernia. Lungs/Pleura: No pleural fluid. Isolated, mildly spiculated pleural-based left lower lobe 9 x 7 mm pulmonary nodule on image 136/4, new since 2009. Possible subpleural left lower lobe 3 mm nodule versus area of pleural thickening on image 108/4. Musculoskeletal: No acute osseous abnormality. CT ABDOMEN PELVIS FINDINGS Hepatobiliary: Focal steatosis adjacent the falciform ligament. Vague area of hypoattenuation or hypoenhancement in the posterior right hepatic lobe measures 1.6 cm on image 61/2. There is more wedge-shaped mild hypoenhancement peripheral to this including on image 61/2. Cholecystectomy, without biliary ductal dilatation. Pancreas: Normal, without mass or ductal dilatation. Spleen: Hypoattenuating splenic lesion of 1.9 cm anteriorly is present at 1.3 cm in 2009 and is likely a benign lesion such as a hemangioma or lymphangioma. Adrenals/Urinary Tract: Normal adrenal glands. Normal kidneys, without hydronephrosis. Normal urinary bladder. Stomach/Bowel: Normal remainder of the stomach. Low rectal wall thickening and asymmetric right-sided mucosal hyperenhancement, including on image 123/2. Small perirectal nodes, the largest of which measures 7 mm on image 118/2 (5 o'clock position). Normal terminal ileum and appendix.  Normal small bowel. Vascular/Lymphatic: Aortic atherosclerosis. No abdominal or pelvic sidewall adenopathy. Reproductive: Retroverted  uterus.  No adnexal mass. Other: No significant free fluid. Musculoskeletal: No acute osseous abnormality. IMPRESSION: 1. Rectal wall thickening, likely representing the primary lesion. Perirectal nodes of maximally 7 mm, suspicious for nodal metastasis. 2. Isolated 9 mm left lower lobe pulmonary nodule, new since 07/26/2007. Suspicious for pulmonary metastasis from 1 of the patient's primaries. Consider further evaluation with PET versus tissue sampling. 3. Right hepatic lobe hypoattenuation, including central more nodular component and peripheral more wedge-shaped component. Indeterminate. Differential considerations include a  benign or malignant lesion with secondary altered perfusion. Given geographic appearance, focal steatosis is possible. This would also be better characterized with PET and especially if PET is not definitive, dedicated pre and post contrast abdominal MRI. 4.  Aortic Atherosclerosis (ICD10-I70.0). 5. Slight increase in ascending aortic dilatation. Recommend annual imaging followup by CTA or MRA. This recommendation follows 2010 ACCF/AHA/AATS/ACR/ASA/SCA/SCAI/SIR/STS/SVM Guidelines for the Diagnosis and Management of Patients with Thoracic Aortic Disease. Circulation. 2010; 121: T517-O160 Electronically Signed   By: Abigail Miyamoto M.D.   On: 02/21/2018 12:27   Mr Pelvis W VP Contrast  Result Date: 02/21/2018 CLINICAL DATA:  New diagnosis of rectal cancer. EXAM: MRI PELVIS WITHOUT AND WITH CONTRAST TECHNIQUE: Multiplanar multisequence MR imaging of the pelvis was performed both before and after administration of intravenous contrast. Small amount of [name] gel was administered per rectum to optimize tumor evaluation. CONTRAST:  7.5 cc Gadavist COMPARISON:  Today's CT, dictated separately. Colonoscopy report of 02/19/2018 also reviewed. FINDINGS: TUMOR LOCATION Location from Anal Verge:  Mid, 7.2 cm from anal verge on 16/3 Shortest Distance from Tumor to Anal Sphincter: On the order of 2.1 cm on  image 18/6 TUMOR DESCRIPTION Circumferential extent: approximately 180 degree circumferential, centered about the 6 o'clock position on image 21/4. Craniocaudal Extent:  4.4 cm on image 14/6 T - CATEGORY Extension through Muscularis Propria:  Yes, T3 Maximum extension beyond Muscularis Propria: 6 mm, including on image 21/4 (advanced T3 = >33mm) Extramural vascular invasion/tumor thrombus: Likely present including at the 5 o'clock position on image 10/9 and image 16/4. Shortest distance of any tumor/node from Mesorectal Fascia: 7 mm, including on image 21/4. Invasion of Anterior Peritoneal Reflection:  No Involvement of Adjacent Organs or Pelvic Sidewall Structures:  No N - CATEGORY Mesorectal Lymph Nodes >=42mm: Up to 6 mm at the 7 o'clock position on image 3/9 and image 8/9. Extra-mesorectal Lymphadenopathy: Equivocal. Right obturator node of 6 mm on image 10/9. Right external iliac node at 8 mm on image 15/9. Other: No significant free fluid. Normal urinary bladder, uterus, and adnexa. IMPRESSION: Rectal adenocarcinoma T stage:   T3 Rectal adenocarcinoma N stage: N1-equivocal N2 nodes. Distance from tumor to the anal sphincter is 2.1 cm. Electronically Signed   By: Abigail Miyamoto M.D.   On: 02/21/2018 14:01   Ct Abdomen Pelvis W Contrast  Result Date: 02/21/2018 CLINICAL DATA:  Rectal cancer on colonoscopy. Hematochezia for 1 year. Thyroid cancer with thyroidectomy in 2013. EXAM: CT CHEST, ABDOMEN, AND PELVIS WITH CONTRAST TECHNIQUE: Multidetector CT imaging of the chest, abdomen and pelvis was performed following the standard protocol during bolus administration of intravenous contrast. CONTRAST:  139mL ISOVUE-300 IOPAMIDOL (ISOVUE-300) INJECTION 61% COMPARISON:  Colonoscopy report 02/19/2018.  Chest CT 07/26/07 FINDINGS: CT CHEST FINDINGS Cardiovascular: Ascending aortic dilatation is mild, including at 4.2 cm on image 30/2. Similar to minimally increased compared to 4.1 cm on 07/26/2007. Normal caliber of the  transverse and descending aorta. Normal heart size, without pericardial effusion. No central pulmonary embolism, on this non-dedicated study. Mediastinum/Nodes: Left thyroidectomy. No supraclavicular adenopathy. No mediastinal or hilar adenopathy. Tiny hiatal hernia. Lungs/Pleura: No pleural fluid. Isolated, mildly spiculated pleural-based left lower lobe 9 x 7 mm pulmonary nodule on image 136/4, new since 2009. Possible subpleural left lower lobe 3 mm nodule versus area of pleural thickening on image 108/4. Musculoskeletal: No acute osseous abnormality. CT ABDOMEN PELVIS FINDINGS Hepatobiliary: Focal steatosis adjacent the falciform ligament. Vague area of hypoattenuation or hypoenhancement in the posterior right hepatic lobe measures 1.6  cm on image 61/2. There is more wedge-shaped mild hypoenhancement peripheral to this including on image 61/2. Cholecystectomy, without biliary ductal dilatation. Pancreas: Normal, without mass or ductal dilatation. Spleen: Hypoattenuating splenic lesion of 1.9 cm anteriorly is present at 1.3 cm in 2009 and is likely a benign lesion such as a hemangioma or lymphangioma. Adrenals/Urinary Tract: Normal adrenal glands. Normal kidneys, without hydronephrosis. Normal urinary bladder. Stomach/Bowel: Normal remainder of the stomach. Low rectal wall thickening and asymmetric right-sided mucosal hyperenhancement, including on image 123/2. Small perirectal nodes, the largest of which measures 7 mm on image 118/2 (5 o'clock position). Normal terminal ileum and appendix.  Normal small bowel. Vascular/Lymphatic: Aortic atherosclerosis. No abdominal or pelvic sidewall adenopathy. Reproductive: Retroverted uterus.  No adnexal mass. Other: No significant free fluid. Musculoskeletal: No acute osseous abnormality. IMPRESSION: 1. Rectal wall thickening, likely representing the primary lesion. Perirectal nodes of maximally 7 mm, suspicious for nodal metastasis. 2. Isolated 9 mm left lower lobe  pulmonary nodule, new since 07/26/2007. Suspicious for pulmonary metastasis from 1 of the patient's primaries. Consider further evaluation with PET versus tissue sampling. 3. Right hepatic lobe hypoattenuation, including central more nodular component and peripheral more wedge-shaped component. Indeterminate. Differential considerations include a benign or malignant lesion with secondary altered perfusion. Given geographic appearance, focal steatosis is possible. This would also be better characterized with PET and especially if PET is not definitive, dedicated pre and post contrast abdominal MRI. 4.  Aortic Atherosclerosis (ICD10-I70.0). 5. Slight increase in ascending aortic dilatation. Recommend annual imaging followup by CTA or MRA. This recommendation follows 2010 ACCF/AHA/AATS/ACR/ASA/SCA/SCAI/SIR/STS/SVM Guidelines for the Diagnosis and Management of Patients with Thoracic Aortic Disease. Circulation. 2010; 121: [id number] Electronically Signed   By: Abigail Miyamoto M.D.   On: 02/21/2018 12:27   Mr Liver W VZ Contrast  Result Date: 03/08/2018 CLINICAL DATA:  Rectal cancer. Hypermetabolic lesion in the central right liver on PET-CT. EXAM: MRI ABDOMEN WITHOUT AND WITH CONTRAST TECHNIQUE: Multiplanar multisequence MR imaging of the abdomen was performed both before and after the administration of intravenous contrast. CONTRAST:  7.5 cc Gadavist COMPARISON:  PET-CT 03/05/2018.  CT abdomen and pelvis 02/21/2018. FINDINGS: Lower chest: Unremarkable. Hepatobiliary: 1.4 cm T1 hypointense, T2 intermediate intensity lesion is identified in the posterior right liver (segment VI). This lesion restricts diffusion and shows circumferential low level peripheral enhancement. Delayed post-contrast imaging shows altered perfusion peripheral to the lesion. A 5 mm focus of hyperenhancement is identified in the lateral segment left liver on the 5 minutes postcontrast T1 gradient imaging (image 26/series 22). No definite signal  abnormality identified at this location on precontrast imaging. Gallbladder surgically absent. No intrahepatic or extrahepatic biliary dilation. Pancreas: No focal mass lesion. No dilatation of the main duct. No intraparenchymal cyst. No peripancreatic edema. Spleen: 2.3 cm septated cystic lesion identified anterior spleen. No irregular septal thickening or gross mural nodularity. Adrenals/Urinary Tract: No adrenal nodule or mass. Tiny cysts noted interpolar right kidney. Left kidney unremarkable. Stomach/Bowel: Stomach is nondistended. No gastric wall thickening. No evidence of outlet obstruction. Duodenum is normally positioned as is the ligament of Treitz. No small bowel wall thickening. No small bowel or colonic dilatation within the visualized abdomen. Vascular/Lymphatic: No abdominal aortic aneurysm. There is no gastrohepatic or hepatoduodenal ligament lymphadenopathy. No intraperitoneal or retroperitoneal lymphadenopathy. Other:  No intraperitoneal free fluid. Musculoskeletal: No abnormal marrow enhancement within the visualized bony anatomy. IMPRESSION: 1. 1.4 cm lesion in the posterior right hepatic lobe restricts diffusion and shows relatively diffuse delayed enhancement. Given the hypermetabolism  seen in this lesion on PET-CT, the finding remains highly concerning for metastatic disease. 2. 5 mm focus of delayed hyperenhancement in the lateral segment left liver, nonspecific, but as a second metastatic deposit is not be excluded, close attention will be required. 3. Complex cyst anterior spleen. 4. Tiny simple cyst interpolar right kidney. Electronically Signed   By: Misty Stanley M.D.   On: 03/08/2018 10:28   Nm Pet Image Initial (pi) Skull Base To Thigh  Result Date: 03/05/2018 CLINICAL DATA:  Initial treatment strategy for rectal cancer. EXAM: NUCLEAR MEDICINE PET SKULL BASE TO THIGH TECHNIQUE: 9.41 mCi F-18 FDG was injected intravenously. Full-ring PET imaging was performed from the skull base to  thigh after the radiotracer. CT data was obtained and used for attenuation correction and anatomic localization. Fasting blood glucose: 82 mg/dl COMPARISON:  CT abdomen/pelvis 02/21/2018 and pelvic MRI 02/21/2018 FINDINGS: Mediastinal blood pool activity: SUV max 2.65 NECK: No hypermetabolic lymph nodes in the neck. Incidental CT findings: none CHEST: No enlarged or hypermetabolic mediastinal or hilar lymph nodes. 7 mm left basilar pulmonary nodule also seen on the prior chest CT is very slightly hypermetabolic with SUV max of 5.78. This is worrisome for a metastatic focus. Incidental CT findings: none ABDOMEN/PELVIS: There is a hypermetabolic liver lesion in segment 6 consistent with a metastasis. It is difficult to see on the noncontrast CT scan but is evident on a prior CT scan of the abdomen. No other liver lesions are identified. The splenic lesion is not hypermetabolic. The rectal mass is hypermetabolic with SUV max of 46.96. 9 mm left perirectal lymph node is hypermetabolic with SUV max of 2.95. There are also small nodes in the sigmoid mesocolon matter mildly hypermetabolic. The more caudal node on image number 221 measures 9 mm and the SUV max is 3.37. The slightly more cephalad node measures 8 mm in the SUV max is 3.64. No retroperitoneal adenopathy. Incidental CT findings: none SKELETON: No focal hypermetabolic activity to suggest skeletal metastasis. Incidental CT findings: none IMPRESSION: 1. Hypermetabolic rectal mass consistent with known neoplasm. 2. Small but hypermetabolic left perirectal and sigmoid mesocolon lymph nodes. 3. Single hepatic metastatic lesion in the right hepatic lobe. 4. 7 mm pulmonary nodule at the left lung base is suspicious for metastasis. Electronically Signed   By: Marijo Sanes M.D.   On: 03/05/2018 16:22   Mm 3d Screen Breast Bilateral  Result Date: 03/02/2018 CLINICAL DATA:  Screening. EXAM: DIGITAL SCREENING BILATERAL MAMMOGRAM WITH TOMO AND CAD COMPARISON:  Previous  exam(s). ACR Breast Density Category c: The breast tissue is heterogeneously dense, which may obscure small masses. FINDINGS: There are no findings suspicious for malignancy. Images were processed with CAD. IMPRESSION: No mammographic evidence of malignancy. A result letter of this screening mammogram will be mailed directly to the patient. RECOMMENDATION: Screening mammogram in one year. (Code:[id number]) BI-RADS CATEGORY  1: Negative. Electronically Signed   By: Curlene Dolphin M.D.   On: 03/02/2018 16:39    Labs:  CBC: Recent Labs    08/03/17 1528 02/19/18 1102 03/06/18 0957  WBC 6.3 4.5 5.5  HGB 13.7 11.1* 13.3  HCT 38.8 32.7* 39.2  PLT 188 147* 178    COAGS: No results for input(s): INR, APTT in the last 8760 hours.  BMP: Recent Labs    02/07/18 0826 03/06/18 0957  NA  --  138  K  --  3.2*  CL  --  104  CO2  --  25  GLUCOSE 95 108*  BUN  --  13  CALCIUM  --  9.4  CREATININE  --  0.77  GFRNONAA  --  >60  GFRAA  --  >60    LIVER FUNCTION TESTS: Recent Labs    03/06/18 0957  BILITOT 2.6*  AST 37  ALT 44  ALKPHOS 72  PROT 7.9  ALBUMIN 4.9    TUMOR MARKERS: No results for input(s): AFPTM, CEA, CA199, CHROMGRNA in the last 8760 hours.  Assessment and Plan:  Newly diagnosed rectal cancer +PET; MR liver lesion Scheduled for biopsy today of same Risks and benefits discussed with the patient including, but not limited to bleeding, infection, damage to adjacent structures or low yield requiring additional tests.  All of the patient's questions were answered, patient is agreeable to proceed. Consent signed and in chart.   Thank you for this interesting consult.  I greatly enjoyed meeting Samantha Reynolds and look forward to participating in their care.  A copy of this report was sent to the requesting provider on this date.  Electronically Signed: Lavonia Drafts, PA-C 03/09/2018, 8:17 AM   I spent a total of  30 Minutes   in face to face in clinical  consultation, greater than 50% of which was counseling/coordinating care for liver lesion biopsy

## 2018-03-09 NOTE — Progress Notes (Signed)
Pt c/o of anterior mid upper abdominal discomfort, new onset. Pt states she has been experiencing discomfort after eating. Also needs to get up fr the bathroom. Dr Pascal Lux and Jannifer Franklin pa notified, orders followed.

## 2018-03-09 NOTE — Procedures (Signed)
Pre Procedure Dx: Liver lesion Post Procedural Dx: Same  Technically successful US guided biopsy of indeterminate lesion within the right lobe of the liver.   EBL: Trace No immediate complications.   Jay Korinne Greenstein, MD Pager #: [phone]     

## 2018-03-09 NOTE — Discharge Instructions (Signed)
Liver Biopsy, Care After °These instructions give you information on caring for yourself after your procedure. Your doctor may also give you more specific instructions. Call your doctor if you have any problems or questions after your procedure. °Follow these instructions at home: °· Rest at home for 1-2 days or as told by your doctor. °· Have someone stay with you for at least 24 hours. °· Do not do these things in the first 24 hours: °? Drive. °? Use machinery. °? Take care of other people. °? Sign legal documents. °? Take a bath or shower. °· There are many different ways to close and cover a cut (incision). For example, a cut can be closed with stitches, skin glue, or adhesive strips. Follow your doctor's instructions on: °? Taking care of your cut. °? Changing and removing your bandage (dressing). °? Removing whatever was used to close your cut. °· Do not drink alcohol in the first week. °· Do not lift more than 5 pounds or play contact sports for the first 2 weeks. °· Take medicines only as told by your doctor. For 1 week, do not take medicine that has aspirin in it or medicines like ibuprofen. °· Get your test results. °Contact a doctor if: °· A cut bleeds and leaves more than just a small spot of blood. °· A cut is red, puffs up (swells), or hurts more than before. °· Fluid or something else comes from a cut. °· A cut smells bad. °· You have a fever or chills. °Get help right away if: °· You have swelling, bloating, or pain in your belly (abdomen). °· You get dizzy or faint. °· You have a rash. °· You feel sick to your stomach (nauseous) or throw up (vomit). °· You have trouble breathing, feel short of breath, or feel faint. °· Your chest hurts. °· You have problems talking or seeing. °· You have trouble balancing or moving your arms or legs. °This information is not intended to replace advice given to you by your health care provider. Make sure you discuss any questions you have with your health care  provider. °Document Released: 12/22/2007 Document Revised: 08/20/2015 Document Reviewed: 05/10/2013 °Elsevier Interactive Patient Education © 2018 [employer]vier Inc[employer] ° ° ° ° °Moderate Conscious Sedation, Adult, Care After °These instructions provide you with information about caring for yourself after your procedure. Your health care provider may also give you more specific instructions. Your treatment has been planned according to current medical practices, but problems sometimes occur. Call your health care provider if you have any problems or questions after your procedure. °What can I expect after the procedure? °After your procedure, it is common: °· To feel sleepy for several hours. °· To feel clumsy and have poor balance for several hours. °· To have poor judgment for several hours. °· To vomit if you eat too soon. ° °Follow these instructions at home: °For at least 24 hours after the procedure: ° °· Do not: °? Participate in activities where you could fall or become injured. °? Drive. °? Use heavy machinery. °? Drink alcohol. °? Take sleeping pills or medicines that cause drowsiness. °? Make important decisions or sign legal documents. °? Take care of children on your own. °· Rest. °Eating and drinking °· Follow the diet recommended by your health care provider. °· If you vomit: °? Drink water, juice, or soup when you can drink without vomiting. °? Make sure you have little or no nausea before eating solid foods. °General instructions °·   Have a responsible adult stay with you until you are awake and alert. °· Take over-the-counter and prescription medicines only as told by your health care provider. °· If you smoke, do not smoke without supervision. °· Keep all follow-up visits as told by your health care provider. This is important. °Contact a health care provider if: °· You keep feeling nauseous or you keep vomiting. °· You feel light-headed. °· You develop a rash. °· You have a fever. °Get help right away  if: °· You have trouble breathing. °This information is not intended to replace advice given to you by your health care provider. Make sure you discuss any questions you have with your health care provider. °Document Released: 01/02/2013 Document Revised: 08/17/2015 Document Reviewed: 07/04/2015 °Elsevier Interactive Patient Education © 2018 Elsevier Inc. ° ° °

## 2018-03-12 ENCOUNTER — Ambulatory Visit (HOSPITAL_COMMUNITY): Payer: 59 | Admitting: Hematology

## 2018-03-13 ENCOUNTER — Ambulatory Visit (INDEPENDENT_AMBULATORY_CARE_PROVIDER_SITE_OTHER): Payer: 59 | Admitting: General Surgery

## 2018-03-13 ENCOUNTER — Encounter (HOSPITAL_COMMUNITY): Payer: Self-pay | Admitting: Hematology

## 2018-03-13 ENCOUNTER — Encounter (HOSPITAL_COMMUNITY): Payer: Self-pay | Admitting: *Deleted

## 2018-03-13 ENCOUNTER — Other Ambulatory Visit (HOSPITAL_COMMUNITY)
Admission: RE | Admit: 2018-03-13 | Discharge: 2018-03-13 | Disposition: A | Discharge status: Home / Self Care | Payer: 59 | Source: Ambulatory Visit | Attending: Endocrinology | Admitting: Endocrinology

## 2018-03-13 ENCOUNTER — Encounter: Payer: Self-pay | Admitting: General Surgery

## 2018-03-13 ENCOUNTER — Inpatient Hospital Stay (HOSPITAL_BASED_OUTPATIENT_CLINIC_OR_DEPARTMENT_OTHER): Payer: 59 | Admitting: Hematology

## 2018-03-13 ENCOUNTER — Inpatient Hospital Stay (HOSPITAL_COMMUNITY): Payer: 59

## 2018-03-13 ENCOUNTER — Other Ambulatory Visit (HOSPITAL_COMMUNITY): Payer: 59

## 2018-03-13 ENCOUNTER — Ambulatory Visit: Payer: 59 | Admitting: General Surgery

## 2018-03-13 ENCOUNTER — Ambulatory Visit (HOSPITAL_COMMUNITY): Payer: 59

## 2018-03-13 ENCOUNTER — Encounter (HOSPITAL_COMMUNITY)
Admission: RE | Admit: 2018-03-13 | Discharge: 2018-03-13 | Disposition: A | Discharge status: Home / Self Care | Payer: 59 | Source: Ambulatory Visit | Attending: General Surgery | Admitting: General Surgery

## 2018-03-13 ENCOUNTER — Encounter (HOSPITAL_COMMUNITY): Payer: Self-pay

## 2018-03-13 VITALS — BP 109/89 | HR 103 | Temp 98.0°F | Resp 16 | Wt 173.0 lb

## 2018-03-13 DIAGNOSIS — K5900 Constipation, unspecified: Secondary | ICD-10-CM | POA: Diagnosis not present

## 2018-03-13 DIAGNOSIS — C787 Secondary malignant neoplasm of liver and intrahepatic bile duct: Secondary | ICD-10-CM

## 2018-03-13 DIAGNOSIS — R200 Anesthesia of skin: Secondary | ICD-10-CM | POA: Diagnosis not present

## 2018-03-13 DIAGNOSIS — R911 Solitary pulmonary nodule: Secondary | ICD-10-CM

## 2018-03-13 DIAGNOSIS — C73 Malignant neoplasm of thyroid gland: Secondary | ICD-10-CM | POA: Insufficient documentation

## 2018-03-13 DIAGNOSIS — Z85850 Personal history of malignant neoplasm of thyroid: Secondary | ICD-10-CM | POA: Diagnosis not present

## 2018-03-13 DIAGNOSIS — Z5111 Encounter for antineoplastic chemotherapy: Secondary | ICD-10-CM | POA: Diagnosis not present

## 2018-03-13 DIAGNOSIS — Z87891 Personal history of nicotine dependence: Secondary | ICD-10-CM

## 2018-03-13 DIAGNOSIS — Z791 Long term (current) use of non-steroidal anti-inflammatories (NSAID): Secondary | ICD-10-CM

## 2018-03-13 DIAGNOSIS — E063 Autoimmune thyroiditis: Secondary | ICD-10-CM | POA: Insufficient documentation

## 2018-03-13 DIAGNOSIS — Z79899 Other long term (current) drug therapy: Secondary | ICD-10-CM | POA: Diagnosis not present

## 2018-03-13 DIAGNOSIS — F419 Anxiety disorder, unspecified: Secondary | ICD-10-CM | POA: Diagnosis not present

## 2018-03-13 DIAGNOSIS — E890 Postprocedural hypothyroidism: Secondary | ICD-10-CM | POA: Diagnosis not present

## 2018-03-13 DIAGNOSIS — C20 Malignant neoplasm of rectum: Secondary | ICD-10-CM | POA: Diagnosis not present

## 2018-03-13 DIAGNOSIS — R634 Abnormal weight loss: Secondary | ICD-10-CM | POA: Diagnosis not present

## 2018-03-13 DIAGNOSIS — K921 Melena: Secondary | ICD-10-CM | POA: Diagnosis not present

## 2018-03-13 DIAGNOSIS — R110 Nausea: Secondary | ICD-10-CM | POA: Diagnosis not present

## 2018-03-13 LAB — T4, FREE: Free T4: 0.95 ng/dL (ref 0.82–1.77)

## 2018-03-13 LAB — TSH: TSH: <0.01 u[IU]/mL — ABNORMAL LOW (ref 0.350–4.500)

## 2018-03-13 MED ORDER — LIDOCAINE-PRILOCAINE 2.5-2.5 % EX CREA: TOPICAL_CREAM | CUTANEOUS | 3 refills | Status: DC

## 2018-03-13 MED ORDER — PROCHLORPERAZINE MALEATE 10 MG PO TABS: 10.0000 mg | ORAL_TABLET | Freq: Four times a day (QID) | ORAL | 1 refills | Status: DC | PRN

## 2018-03-13 MED FILL — LIDOCAINE-PRILOCAINE CREAM: 2.5-2.5 | 30 days supply | Qty: 30 | Fill #0

## 2018-03-13 MED FILL — MAGIC MW (HC/LIDO/BEN): 12 days supply | Qty: 240 | Fill #0

## 2018-03-13 MED FILL — PROCHLORPERAZINE 10 MG TAB: 10 | 8 days supply | Qty: 30 | Fill #0

## 2018-03-13 NOTE — Patient Instructions (Addendum)
Implanted Port Insertion, Care After This sheet gives you information about how to care for yourself after your procedure. Your health care provider may also give you more specific instructions. If you have problems or questions, contact your health care provider. What can I expect after the procedure? After your procedure, it is common to have:  Discomfort at the port insertion site.  Bruising on the skin over the port. This should improve over 3-4 days.  Follow these instructions at home: [facility] care  After your port is placed, you will get a manufacturer's information card. The card has information about your port. Keep this card with you at all times.  Take care of the port as told by your health care provider. Ask your health care provider if you or a family member can get training for taking care of the port at home. A home health care nurse may also take care of the port.  Make sure to remember what type of port you have. Incision care  Follow instructions from your health care provider about how to take care of your port insertion site. Make sure you: ? Wash your hands with soap and water before you change your bandage (dressing). If soap and water are not available, use hand sanitizer. ? Change your dressing as told by your health care provider. ? Leave stitches (sutures), skin glue, or adhesive strips in place. These skin closures may need to stay in place for 2 weeks or longer. If adhesive strip edges start to loosen and curl up, you may trim the loose edges. Do not remove adhesive strips completely unless your health care provider tells you to do that.  Check your port insertion site every day for signs of infection. Check for: ? More redness, swelling, or pain. ? More fluid or blood. ? Warmth. ? Pus or a bad smell. General instructions  Do not take baths, swim, or use a hot tub until your health care provider approves.  Do not lift anything that is heavier than 10 lb (4.5  kg) for a week, or as told by your health care provider.  Ask your health care provider when it is okay to:  Implanted [facility] Guide An implanted port is a type of central line that is placed under the skin. Central lines are used to provide IV access when treatment or nutrition needs to be given through a person's veins. Implanted ports are used for long-term IV access. An implanted port may be placed because:  You need IV medicine that would be irritating to the small veins in your hands or arms.  You need long-term IV medicines, such as antibiotics.  You need IV nutrition for a long period.  You need frequent blood draws for lab tests.  You need dialysis.  Implanted ports are usually placed in the chest area, but they can also be placed in the upper arm, the abdomen, or the leg. An implanted port has two main parts:  Reservoir. The reservoir is round and will appear as a small, raised area under your skin. The reservoir is the part where a needle is inserted to give medicines or draw blood.  Catheter. The catheter is a thin, flexible tube that extends from the reservoir. The catheter is placed into a large vein. Medicine that is inserted into the reservoir goes into the catheter and then into the vein.  How will I care for my incision site? Do not get the incision site wet. Bathe or shower  as directed by your health care provider. How is my port accessed? Special steps must be taken to access the port:  Before the port is accessed, a numbing cream can be placed on the skin. This helps numb the skin over the port site.  Your health care provider uses a sterile technique to access the port. ? Your health care provider must put on a mask and sterile gloves. ? The skin over your port is cleaned carefully with an antiseptic and allowed to dry. ? The port is gently pinched between sterile gloves, and a needle is inserted into the port.  Only "non-coring" port needles should be used  to access the port. Once the port is accessed, a blood return should be checked. This helps ensure that the port is in the vein and is not clogged.  If your port needs to remain accessed for a constant infusion, a clear (transparent) bandage will be placed over the needle site. The bandage and needle will need to be changed every week, or as directed by your health care provider.  Keep the bandage covering the needle clean and dry. Do not get it wet. Follow your health care provider's instructions on how to take a shower or bath while the port is accessed.  If your port does not need to stay accessed, no bandage is needed over the port.  What is flushing? Flushing helps keep the port from getting clogged. Follow your health care provider's instructions on how and when to flush the port. Ports are usually flushed with saline solution or a medicine called heparin. The need for flushing will depend on how the port is used.  If the port is used for intermittent medicines or blood draws, the port will need to be flushed: ? After medicines have been given. ? After blood has been drawn. ? As part of routine maintenance.  If a constant infusion is running, the port may not need to be flushed.  How long will my port stay implanted? The port can stay in for as long as your health care provider thinks it is needed. When it is time for the port to come out, surgery will be done to remove it. The procedure is similar to the one performed when the port was put in. When should I seek immediate medical care? When you have an implanted port, you should seek immediate medical care if:  You notice a bad smell coming from the incision site.  You have swelling, redness, or drainage at the incision site.  You have more swelling or pain at the port site or the surrounding area.  You have a fever that is not controlled with medicine.  This information is not intended to replace advice given to you by your  health care provider. Make sure you discuss any questions you have with your health care provider. Document Released: 03/14/2005 Document Revised: 08/20/2015 Document Reviewed: 11/19/2012 Elsevier Interactive Patient Education  2017 [employer]. ? Return to work or school. ? Resume usual physical activities or sports.  Do not drive for 24 hours if you were given a medicine to help you relax (sedative).  Take over-the-counter and prescription medicines only as told by your health care provider.  Wear a medical alert bracelet in case of an emergency. This will tell any health care providers that you have a port.  Keep all follow-up visits as told by your health care provider. This is important. Contact a health care provider if:  You  cannot flush your port with saline as directed, or you cannot draw blood from the port.  You have a fever or chills.  You have more redness, swelling, or pain around your port insertion site.  You have more fluid or blood coming from your port insertion site.  Your port insertion site feels warm to the touch.  You have pus or a bad smell coming from the port insertion site. Get help right away if:  You have chest pain or shortness of breath.  You have bleeding from your port that you cannot control. Summary  Take care of the port as told by your health care provider.  Change your dressing as told by your health care provider.  Keep all follow-up visits as told by your health care provider. This information is not intended to replace advice given to you by your health care provider. Make sure you discuss any questions you have with your health care provider. Document Released: 01/02/2013 Document Revised: 02/03/2016 Document Reviewed: 02/03/2016 Elsevier Interactive Patient Education  2017 [employer].

## 2018-03-13 NOTE — H&P (Signed)
Samantha Reynolds; [id number]; 1966/03/23   HPI Patient is a 52 year old white female who was referred to my care by Dr. Delton Coombes for Port-A-Cath placement.  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  She is about to undergo chemotherapy.  She has 2 out of 10 pain from a recent liver biopsy.  She denies any fever or chills.  She is to start chemotherapy tomorrow. Past Medical History:  Diagnosis Date  . Anxiety   . Cancer [facility]) 2013   thyroid  . Endometrial polyp   . Endometrial thickening on ultra sound   . GERD (gastroesophageal reflux disease)   . H/O Hashimoto thyroiditis   . 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 Hypothyroidism     Past Surgical History:  Procedure Laterality Date  . BIOPSY  02/19/2018   Procedure: BIOPSY;  Surgeon: Rogene Houston, MD;  [facility];  Service: Endoscopy;;  rectum  . COLONOSCOPY N/A 08/20/2015   Procedure: COLONOSCOPY;  Surgeon: Rogene [place], MD;  [facility];  Service: Endoscopy;  Laterality: N/A;  730  . [place] ENDOMETRIAL ABLATION  08-13-2004  . FLEXIBLE SIGMOIDOSCOPY N/A 02/19/2018   Procedure: FLEXIBLE SIGMOIDOSCOPY;  Surgeon: Rogene [place], MD;  [facility];  Service: Endoscopy;  Laterality: N/A;  . HYSTEROSCOPY W/D&C N/A 04/30/2015   Procedure: DILATATION AND CURETTAGE / INTENDED HYSTEROSCOPY;  Surgeon: Dian Queen, MD;  Location: [place];  Service: Gynecology;  Laterality: N/A;  . IR US GUIDE BX ASP/DRAIN  03/09/2018  . LAPAROSCOPIC CHOLECYSTECTOMY  04/1996  . POLYPECTOMY  08/20/2015   Procedure: POLYPECTOMY;  Surgeon: Rogene [place], MD;  [facility];  Service: Endoscopy;;  Splenic flexure polypectomy  . POLYPECTOMY  02/19/2018   Procedure: POLYPECTOMY;  Surgeon: Rogene [place], MD;  [facility];  Service:  Endoscopy;;  rectum  . REDUCTION INCARCERATED UTERUS  06-20-2000   intrauterine preg. 13 wks /  urinary retention  . THYROID LOBECTOMY  11/24/2011   Procedure: THYROID LOBECTOMY;  Surgeon: Earnstine Regal, MD;  Location: [facility];  Service: General;  Laterality: Left;  Left Thyroid Lobectomy  . TUBAL LIGATION  2002    Family History  Problem Relation Age of Onset  . Hypertension Mother   . Hypertension Father   . Prostate cancer Father   . Cancer Maternal Aunt        spine/back  . Cancer Paternal Grandfather        lung  . Healthy Son   . Healthy Son   . Healthy Son   . Healthy Son   . Colon cancer Neg Hx     Current Outpatient Medications on File Prior to Visit  Medication Sig Dispense Refill  . ALPRAZolam (XANAX) 0.5 MG tablet Take 1 tablet (0.5 mg total) by mouth 2 (two) times daily as needed for anxiety. 60 tablet 1  . calcium carbonate (TUMS - DOSED IN MG ELEMENTAL CALCIUM) 500 MG chewable tablet Chew 1 tablet by mouth 3 (three) times daily as needed for indigestion or heartburn.     . fluorouracil CALGB 63149 in sodium chloride 0.9 % 150 mL Inject into the vein over 96 hr.    . fluticasone (CUTIVATE) 0.05 % cream Apply 1 application topically 2 (two) times daily as needed. Skin irritation/rash  3  . ibuprofen (ADVIL,MOTRIN)  200 MG tablet Take 400 mg by mouth every 8 (eight) hours as needed (for pain.).     Marland Kitchen ketotifen (ALAWAY) 0.025 % ophthalmic solution Place 1 drop into both eyes daily as needed (for allergy eyes.).    Marland Kitchen leucovorin in dextrose 5 % 250 mL Inject into the vein once.    . lidocaine-prilocaine (EMLA) cream Apply small amount to port site one hour prior to appointment and cover with plastic wrap. 30 g 3  . OXALIPLATIN IV Inject into the vein.    Marland Kitchen prochlorperazine (COMPAZINE) 10 MG tablet Take 1 tablet (10 mg total) by mouth every 6 (six) hours as needed (NAUSEA). 30 tablet 1  . progesterone (PROMETRIUM) 100 MG capsule Take 100 mg by mouth at bedtime.    Marland Kitchen thyroid  (ARMOUR) 90 MG tablet Take 90 mg by mouth daily before breakfast.      No current facility-administered medications on file prior to visit.     Allergies  Allergen Reactions  . Demerol [Meperidine] Itching    All over the body.  . Penicillins Hives     childhood, does not remember if it spread all over the body or not. Has patient had a PCN reaction causing immediate rash, facial/tongue/throat swelling, SOB or lightheadedness with hypotension: Nounknown Has patient had a PCN reaction causing severe rash involving mucus membranes or skin necrosis: Nounknown Has patient had a PCN reaction that required hospitalization Nounknown Has patient had a PCN reaction occurring within the last 10 years: {Yes/No:30  . Other Other (See Comments)    COCONUT-HIVES     Social History   Substance and Sexual Activity  Alcohol Use No    Social History   Tobacco Use  Smoking Status Former Smoker  . Packs/day: 0.25  . Years: 10.00  . Pack years: 2.50  . Types: Cigarettes  . Last attempt to quit: 10/31/1991  . Years since quitting: 26.3  Smokeless Tobacco Never Used    Review of Systems  Constitutional: Positive for malaise/fatigue.  HENT: Negative.   Eyes: Negative.   Respiratory: Negative.   Cardiovascular: Negative.   Gastrointestinal: Negative.   Genitourinary: Negative.   Musculoskeletal: Negative.   Skin: Negative.   Neurological: Negative.   Endo/Heme/Allergies: Negative.   Psychiatric/Behavioral: Negative.     Objective   Vitals:   03/13/18 1213  BP: 109/89  Pulse: (!) 103  Resp: 16  Temp: 98 F (36.7 C)    Physical Exam Vitals signs reviewed.  Constitutional:      Appearance: Normal appearance. She is not ill-appearing.  HENT:     Head: Normocephalic and atraumatic.  Cardiovascular:     Rate and Rhythm: Normal rate.     Heart sounds: Normal heart sounds. No murmur. No friction rub. No gallop.   Pulmonary:     Effort: Pulmonary effort is normal. No  respiratory distress.     Breath sounds: Normal breath sounds. No stridor. No wheezing, rhonchi or rales.  Skin:    General: Skin is warm and dry.  Neurological:     Mental Status: She is alert and oriented to person, place, and time.    Oncology notes reviewed Assessment  Stage IV rectal cancer, need for central venous access Plan   Patient is scheduled for Port-A-Cath insertion on 03/14/2018.  The risks and benefits of the procedure including bleeding, infection, and pneumothorax were fully explained to the patient, who gave informed consent.  I will leave the Port-A-Cath accessed.

## 2018-03-13 NOTE — Progress Notes (Signed)
START ON PATHWAY REGIMEN - Colorectal     A cycle is every 14 days:     Bevacizumab-xxxx      Irinotecan      Oxaliplatin      Leucovorin      5-Fluorouracil   **Always confirm dose/schedule in your pharmacy ordering system**  Patient Characteristics: Distant Metastases, First Line, Potentially Resectable, KRAS Mutation Positive/Unknown, BRAF Wild-Type/Unknown, Bevacizumab Eligible Therapeutic Status: Distant Metastases BRAF Mutation Status: Awaiting Test Results KRAS/NRAS Mutation Status: Awaiting Test Results Line of Therapy: First Line Bevacizumab Eligibility: Eligible Intent of Therapy: Curative Intent, Discussed with Patient

## 2018-03-13 NOTE — Progress Notes (Signed)
I spoke with Amber in pathology ordered [facility] on Accession # [id number] Stage IV Dx: C20.

## 2018-03-13 NOTE — Patient Instructions (Signed)
[facility] Discharge Instructions     Thank you for choosing [facility] to provide your oncology and hematology care.  To afford each patient quality time with our provider, please arrive at least 15 minutes before your scheduled appointment time.   If you have a lab appointment with the Cancer Center please come in thru the  Main Entrance and check in at the main information desk  You need to re-schedule your appointment should you arrive 10 or more minutes late.  We strive to give you quality time with our providers, and arriving late affects you and other patients whose appointments are after yours.  Also, if you no show three or more times for appointments you may be dismissed from the clinic at the providers discretion.     Again, thank you for choosing [facility].  Our hope is that these requests will decrease the amount of time that you wait before being seen by our physicians.       _____________________________________________________________  Should you have questions after your visit to [facility], please contact our office at [phone] between the hours of 8:00 a.m. and 4:30 p.m.  Voicemails left after 4:00 p.m. will not be returned until the following business day.  For prescription refill requests, have your pharmacy contact our office and allow 72 hours.    Cancer Center Support Programs:   > Cancer Support Group  2nd Tuesday of the month 1pm-2pm, Journey Room    

## 2018-03-13 NOTE — Progress Notes (Signed)
Samantha Reynolds; [id number]; 1965-12-12   HPI Patient is a 52 year old white female who was referred to my care by Dr. Delton Coombes for Port-A-Cath placement.  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  She is about to undergo chemotherapy.  She has 2 out of 10 pain from a recent liver biopsy.  She denies any fever or chills.  She is to start chemotherapy tomorrow. Past Medical History:  Diagnosis Date  . Anxiety   . Cancer [facility]) 2013   thyroid  . Endometrial polyp   . Endometrial thickening on ultra sound   . GERD (gastroesophageal reflux disease)   . H/O Hashimoto thyroiditis   . 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 Hypothyroidism     Past Surgical History:  Procedure Laterality Date  . BIOPSY  02/19/2018   Procedure: BIOPSY;  Surgeon: Rogene Houston, MD;  [facility];  Service: Endoscopy;;  rectum  . COLONOSCOPY N/A 08/20/2015   Procedure: COLONOSCOPY;  Surgeon: Rogene Houston, MD;  [facility];  Service: Endoscopy;  Laterality: N/A;  730  . [place] ENDOMETRIAL ABLATION  08-13-2004  . FLEXIBLE SIGMOIDOSCOPY N/A 02/19/2018   Procedure: FLEXIBLE SIGMOIDOSCOPY;  Surgeon: Rogene Houston, MD;  [facility];  Service: Endoscopy;  Laterality: N/A;  . HYSTEROSCOPY W/D&C N/A 04/30/2015   Procedure: DILATATION AND CURETTAGE / INTENDED HYSTEROSCOPY;  Surgeon: Dian Queen, MD;  Location: [place];  Service: Gynecology;  Laterality: N/A;  . IR US GUIDE BX ASP/DRAIN  03/09/2018  . LAPAROSCOPIC CHOLECYSTECTOMY  04/1996  . POLYPECTOMY  08/20/2015   Procedure: POLYPECTOMY;  Surgeon: Rogene Houston, MD;  [facility];  Service: Endoscopy;;  Splenic flexure polypectomy  . POLYPECTOMY  02/19/2018   Procedure: POLYPECTOMY;  Surgeon: Rogene Houston, MD;  [facility];  Service:  Endoscopy;;  rectum  . REDUCTION INCARCERATED UTERUS  06-20-2000   intrauterine preg. 13 wks /  urinary retention  . THYROID LOBECTOMY  11/24/2011   Procedure: THYROID LOBECTOMY;  Surgeon: Earnstine Regal, MD;  [facility];  Service: General;  Laterality: Left;  Left Thyroid Lobectomy  . TUBAL LIGATION  2002    Family History  Problem Relation Age of Onset  . Hypertension Mother   . Hypertension Father   . Prostate cancer Father   . Cancer Maternal Aunt        spine/back  . Cancer Paternal Grandfather        lung  . Healthy Son   . Healthy Son   . Healthy Son   . Healthy Son   . Colon cancer Neg Hx     Current Outpatient Medications on File Prior to Visit  Medication Sig Dispense Refill  . ALPRAZolam (XANAX) 0.5 MG tablet Take 1 tablet (0.5 mg total) by mouth 2 (two) times daily as needed for anxiety. 60 tablet 1  . calcium carbonate (TUMS - DOSED IN MG ELEMENTAL CALCIUM) 500 MG chewable tablet Chew 1 tablet by mouth 3 (three) times daily as needed for indigestion or heartburn.     . fluorouracil CALGB 56314 in sodium chloride 0.9 % 150 mL Inject into the vein over 96 hr.    . fluticasone (CUTIVATE) 0.05 % cream Apply 1 application topically 2 (two) times daily as needed. Skin irritation/rash  3  . ibuprofen (ADVIL,MOTRIN)  200 MG tablet Take 400 mg by mouth every 8 (eight) hours as needed (for pain.).     Marland Kitchen ketotifen (ALAWAY) 0.025 % ophthalmic solution Place 1 drop into both eyes daily as needed (for allergy eyes.).    Marland Kitchen leucovorin in dextrose 5 % 250 mL Inject into the vein once.    . lidocaine-prilocaine (EMLA) cream Apply small amount to port site one hour prior to appointment and cover with plastic wrap. 30 g 3  . OXALIPLATIN IV Inject into the vein.    Marland Kitchen prochlorperazine (COMPAZINE) 10 MG tablet Take 1 tablet (10 mg total) by mouth every 6 (six) hours as needed (NAUSEA). 30 tablet 1  . progesterone (PROMETRIUM) 100 MG capsule Take 100 mg by mouth at bedtime.    Marland Kitchen thyroid  (ARMOUR) 90 MG tablet Take 90 mg by mouth daily before breakfast.      No current facility-administered medications on file prior to visit.     Allergies  Allergen Reactions  . Demerol [Meperidine] Itching    All over the body.  . Penicillins Hives     childhood, does not remember if it spread all over the body or not. Has patient had a PCN reaction causing immediate rash, facial/tongue/throat swelling, SOB or lightheadedness with hypotension: no:[id number] Has patient had a PCN reaction causing severe rash involving mucus membranes or skin necrosis: Nounknown Has patient had a PCN reaction that required hospitalization Nounknown Has patient had a PCN reaction occurring within the last 10 years: no:30  . Other Other (See Comments)    COCONUT-HIVES     Social History   Substance and Sexual Activity  Alcohol Use No    Social History   Tobacco Use  Smoking Status Former Smoker  . Packs/day: 0.25  . Years: 10.00  . Pack years: 2.50  . Types: Cigarettes  . Last attempt to quit: 10/31/1991  . Years since quitting: 26.3  Smokeless Tobacco Never Used    Review of Systems  Constitutional: Positive for malaise/fatigue.  HENT: Negative.   Eyes: Negative.   Respiratory: Negative.   Cardiovascular: Negative.   Gastrointestinal: Negative.   Genitourinary: Negative.   Musculoskeletal: Negative.   Skin: Negative.   Neurological: Negative.   Endo/Heme/Allergies: Negative.   Psychiatric/Behavioral: Negative.     Objective   Vitals:   03/13/18 1213  BP: 109/89  Pulse: (!) 103  Resp: 16  Temp: 98 F (36.7 C)    Physical Exam Vitals signs reviewed.  Constitutional:      Appearance: Normal appearance. She is not ill-appearing.  HENT:     Head: Normocephalic and atraumatic.  Cardiovascular:     Rate and Rhythm: Normal rate.     Heart sounds: Normal heart sounds. No murmur. No friction rub. No gallop.   Pulmonary:     Effort: Pulmonary effort is normal. No  respiratory distress.     Breath sounds: Normal breath sounds. No stridor. No wheezing, rhonchi or rales.  Skin:    General: Skin is warm and dry.  Neurological:     Mental Status: She is alert and oriented to person, place, and time.    Oncology notes reviewed Assessment  Stage IV rectal cancer, need for central venous access Plan   Patient is scheduled for Port-A-Cath insertion on 03/14/2018.  The risks and benefits of the procedure including bleeding, infection, and pneumothorax were fully explained to the patient, who gave informed consent.  I will leave the Port-A-Cath accessed.

## 2018-03-13 NOTE — Progress Notes (Signed)
[place], [place] [address]   CLINIC:  Medical Oncology/Hematology  PCP:  [name], MD [address] [place] [address] [phone]   REASON FOR VISIT: Follow-up for rectal cancer  CURRENT THERAPY: Folfox    INTERVAL HISTORY:  Ms. Parrillo 52 y.o. female returns for routine follow-up for rectal cancer. She is here today with her husband. She is having anxiety and decreased appetite and energy level. She is unable to sleep at night due to the stress and anxiety. Otherwise she is ready to begin treatment. She denies any bleeding. Denies any new pains. Denies any nausea, vomiting, or diarrhea. She reports her appetite and energy level at 25%. She is maintaining her weight at this time.     REVIEW OF SYSTEMS:  Review of Systems  Psychiatric/Behavioral: Positive for sleep disturbance. The patient is nervous/anxious.   All other systems reviewed and are negative.    PAST MEDICAL/SURGICAL HISTORY:  Past Medical History:  Diagnosis Date  . Anxiety   . Cancer [facility]) 2013   thyroid  . Endometrial polyp   . Endometrial thickening on ultra sound   . GERD (gastroesophageal reflux disease)   . H/O Hashimoto thyroiditis   . 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 Hypothyroidism    Past Surgical History:  Procedure Laterality Date  . BIOPSY  02/19/2018   Procedure: BIOPSY;  Surgeon: Rogene Houston, MD;  [facility];  Service: Endoscopy;;  rectum  . COLONOSCOPY N/A 08/20/2015   Procedure: COLONOSCOPY;  Surgeon: Rogene Houston, MD;  [facility];  Service: Endoscopy;  Laterality: N/A;  730  . [place] ENDOMETRIAL ABLATION  08-13-2004  . FLEXIBLE SIGMOIDOSCOPY N/A 02/19/2018   Procedure: FLEXIBLE SIGMOIDOSCOPY;  Surgeon: Rogene Houston, MD;  [facility];  Service:  Endoscopy;  Laterality: N/A;  . HYSTEROSCOPY W/D&C N/A 04/30/2015   Procedure: DILATATION AND CURETTAGE / INTENDED HYSTEROSCOPY;  Surgeon: Dian Queen, MD;  Location: [place];  Service: Gynecology;  Laterality: N/A;  . IR US GUIDE BX ASP/DRAIN  03/09/2018  . LAPAROSCOPIC CHOLECYSTECTOMY  04/1996  . POLYPECTOMY  08/20/2015   Procedure: POLYPECTOMY;  Surgeon: Rogene Houston, MD;  [facility];  Service: Endoscopy;;  Splenic flexure polypectomy  . POLYPECTOMY  02/19/2018   Procedure: POLYPECTOMY;  Surgeon: Rogene Houston, MD;  [facility];  Service: Endoscopy;;  rectum  . REDUCTION INCARCERATED UTERUS  06-20-2000   intrauterine preg. 13 wks /  urinary retention  . THYROID LOBECTOMY  11/24/2011   Procedure: THYROID LOBECTOMY;  Surgeon: Earnstine Regal, MD;  [facility];  Service: General;  Laterality: Left;  Left Thyroid Lobectomy  . TUBAL LIGATION  2002     SOCIAL HISTORY:  Social History   Socioeconomic History  . Marital status: Married    Spouse name: Not on file  . Number of children: 4  . Years of education: Not on file  . Highest education level: Not on file  Occupational History  . Not on file  Social Needs  . Financial resource strain: Not hard at all  . Food insecurity:    Worry: Never true    Inability: Never true  . Transportation needs:    Medical: No    Non-medical: No  Tobacco Use  .  Smoking status: Former Smoker    Packs/day: 0.25    Years: 10.00    Pack years: 2.50    Types: Cigarettes    Last attempt to quit: 10/31/1991    Years since quitting: 26.3  . Smokeless tobacco: Never Used  Substance and Sexual Activity  . Alcohol use: No  . Drug use: No  . Sexual activity: Not Currently  Lifestyle  . Physical activity:    Days per week: 0 days    Minutes per session: 0 min  . Stress: To some extent  Relationships  . Social connections:    Talks on phone: More than three times a week    Gets together: Twice a  week    Attends religious service: More than 4 times per year    Active member of club or organization: Yes    Attends meetings of clubs or organizations: More than 4 times per year    Relationship status: Married  . Intimate partner violence:    Fear of current or ex partner: No    Emotionally abused: No    Physically abused: No    Forced sexual activity: No  Other Topics Concern  . Not on file  Social History Narrative  . Not on file    FAMILY HISTORY:  Family History  Problem Relation Age of Onset  . Hypertension Mother   . Hypertension Father   . Prostate cancer Father   . Cancer Maternal Aunt        spine/back  . Cancer Paternal Grandfather        lung  . Healthy Son   . Healthy Son   . Healthy Son   . Healthy Son   . Colon cancer Neg Hx     CURRENT MEDICATIONS:  Outpatient Encounter Medications as of 03/13/2018  Medication Sig Note  . ALPRAZolam (XANAX) 0.5 MG tablet Take 1 tablet (0.5 mg total) by mouth 2 (two) times daily as needed for anxiety.   . calcium carbonate (TUMS - DOSED IN MG ELEMENTAL CALCIUM) 500 MG chewable tablet Chew 1 tablet by mouth 3 (three) times daily as needed for indigestion or heartburn.  02/12/2018: rarely  . fluticasone (CUTIVATE) 0.05 % cream Apply 1 application topically 2 (two) times daily as needed. Skin irritation/rash   . ibuprofen (ADVIL,MOTRIN) 200 MG tablet Take 400 mg by mouth every 8 (eight) hours as needed (for pain.).    Marland Kitchen ketotifen (ALAWAY) 0.025 % ophthalmic solution Place 1 drop into both eyes daily as needed (for allergy eyes.).   Marland Kitchen progesterone (PROMETRIUM) 100 MG capsule Take 100 mg by mouth at bedtime.   Marland Kitchen thyroid (ARMOUR) 90 MG tablet Take 90 mg by mouth daily before breakfast.    . [DISCONTINUED] capecitabine (XELODA) 500 MG tablet Take 3 tablets (1,500 mg total) by mouth 2 (two) times daily after a meal. M-F during XRT 03/09/2018: Not started yet   No facility-administered encounter medications on file as of  03/13/2018.     ALLERGIES:  Please refer to allergy list.   PHYSICAL EXAM:  ECOG Performance status: 1  Vitals:   03/13/18 0937  BP: 133/86  Pulse: (!) 109  Resp: 16  Temp: 98.7 F (37.1 C)  SpO2: 100%   Filed Weights   03/13/18 0937  Weight: 173 lb (78.5 kg)    Physical Exam Constitutional:      Appearance: Normal appearance. She is normal weight.  Musculoskeletal: Normal range of motion.  Skin:    General: Skin  is warm and dry.  Neurological:     Mental Status: She is alert and oriented to person, place, and time. Mental status is at baseline.  Psychiatric:        Mood and Affect: Mood normal.        Behavior: Behavior normal.        Thought Content: Thought content normal.        Judgment: Judgment normal.      LABORATORY DATA:  I have reviewed the labs as listed.  CBC    Component Value Date/Time   WBC 4.3 03/09/2018 0742   RBC 4.08 03/09/2018 0742   HGB 12.7 03/09/2018 0742   HGB 13.5 11/03/2015 0841   HCT 38.3 03/09/2018 0742   HCT 39.7 11/03/2015 0841   PLT 162 03/09/2018 0742   PLT 165 11/03/2015 0841   MCV 93.9 03/09/2018 0742   MCV 95 11/03/2015 0841   MCH 31.1 03/09/2018 0742   MCHC 33.2 03/09/2018 0742   RDW 12.7 03/09/2018 0742   RDW 13.2 11/03/2015 0841   LYMPHSABS 1.0 03/09/2018 0742   LYMPHSABS 1.4 11/03/2015 0841   MONOABS 0.3 03/09/2018 0742   EOSABS 0.0 03/09/2018 0742   EOSABS 0.1 11/03/2015 0841   BASOSABS 0.0 03/09/2018 0742   BASOSABS 0.0 11/03/2015 0841   CMP Latest Ref Rng & Units 03/09/2018 03/06/2018 02/07/2018  Glucose 70 - 99 mg/dL 99 108(H) 95  BUN 6 - 20 mg/dL 15 13 -  Creatinine 0.44 - 1.00 mg/dL 0.82 0.77 -  Sodium 135 - 145 mmol/L 138 138 -  Potassium 3.5 - 5.1 mmol/L 3.2(L) 3.2(L) -  Chloride 98 - 111 mmol/L 99 104 -  CO2 22 - 32 mmol/L 23 25 -  Calcium 8.9 - 10.3 mg/dL 9.3 9.4 -  Total Protein 6.5 - 8.1 g/dL 7.3 7.9 -  Total Bilirubin 0.3 - 1.2 mg/dL 2.4(H) 2.6(H) -  Alkaline Phos 38 - 126 U/L 59 72 -    AST 15 - 41 U/L 26 37 -  ALT 0 - 44 U/L 31 44 -       DIAGNOSTIC IMAGING:  I have independently reviewed the scans and discussed with the patient.   I have reviewed Francene Finders, NP's note and agree with the documentation.  I personally performed a face-to-face visit, made revisions and my assessment and plan is as follows.    ASSESSMENT & PLAN:   Rectal cancer ([facility]) 1.  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 - Colonoscopy on 02/19/2018 shows rectal mass, 2 to 4 cm from the anal verge, normal sigmoid colon.  Biopsies consistent with adenocarcinoma. - MRI of the pelvis shows rectal adenocarcinoma, T stage-T3, N stage- N1-equivocal N2 nodes.  Maximum extension beyond muscularis propria is 6 mm (advanced T3).  Circumferential resection margin is 7 mm.  Right obturator node 6 mm.  Right external iliac node at 8 mm.  Craniocaudal extent of the tumor is 4.4 cm.  Approximately 180 degree circumferential.  Shortest distance from the distal tumor to anal sphincter is 2.1 cm.  Location from anal verge, 7.2 cm to the middle of the tumor. - CT CAP on 02/21/2018 shows 9 x 7 mm left lower lobe pulmonary nodule, new since 2009.  Possible subpleural left lower lobe 3 mm nodule versus area of pleural thickening.  Vague area of hypoattenuation in the posterior  right hepatic lobe measuring 1.6 cm.  Differential includes benign/malignant lesion with altered perfusion.  Focal steatosis is possible. - MRI of the liver on 03/08/2018 shows 1.4 cm lesion in the posterior right hepatic lobe.  There is a 5 mm focus of delayed hyperenhancement in the lateral segment of the left liver, questionable for a second metastatic deposit. -PET CT scan on 03/05/2018 shows hypermetabolic rectal mass.  Left perirectal hypermetabolic and sigmoid mesocolon lymph nodes.  Single hepatic  metastatic lesion in the right hepatic lobe.  7 mm pulmonary nodule at the left lung base is suspicious for meta stasis. -We discussed the liver biopsy results dated 03/09/2018 consistent with adenocarcinoma. -She can be considered as having oligo metastatic disease.  I will obtain foundation 1 testing.  We will also obtain MSI testing. - I have recommended primary treatment with chemotherapy followed by restaging scans in 2 months.  This will be followed by short course radiation therapy and synchronous resection of liver metastasis and rectal lesion. - We talked about various chemotherapy options including standard FOLFOX-based regimen.  I think she will benefit from FOLFOXIRI regimen with bevacizumab given her Strathman age and good performance status. - She will need port for chemotherapy administration.  We talked briefly about side effects of chemotherapy.  I plan to start out with FOLFOX and bevacizumab and add Irinotecan during second cycle.  2.  Thyroid cancer: -She had a left thyroidectomy done about 6 years ago.  She is on Synthroid at this time. -We plan to check her TSH, thyroglobulin and antithyroglobulin antibodies in later part of this month.      Orders placed this encounter:  No orders of the defined types were placed in this encounter.     Derek Jack, MD [place] [phone]

## 2018-03-13 NOTE — Assessment & Plan Note (Signed)
1.  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 - Colonoscopy on 02/19/2018 shows rectal mass, 2 to 4 cm from the anal verge, normal sigmoid colon.  Biopsies consistent with adenocarcinoma. - MRI of the pelvis shows rectal adenocarcinoma, T stage-T3, N stage- N1-equivocal N2 nodes.  Maximum extension beyond muscularis propria is 6 mm (advanced T3).  Circumferential resection margin is 7 mm.  Right obturator node 6 mm.  Right external iliac node at 8 mm.  Craniocaudal extent of the tumor is 4.4 cm.  Approximately 180 degree circumferential.  Shortest distance from the distal tumor to anal sphincter is 2.1 cm.  Location from anal verge, 7.2 cm to the middle of the tumor. - CT CAP on 02/21/2018 shows 9 x 7 mm left lower lobe pulmonary nodule, new since 2009.  Possible subpleural left lower lobe 3 mm nodule versus area of pleural thickening.  Vague area of hypoattenuation in the posterior right hepatic lobe measuring 1.6 cm.  Differential includes benign/malignant lesion with altered perfusion.  Focal steatosis is possible. - MRI of the liver on 03/08/2018 shows 1.4 cm lesion in the posterior right hepatic lobe.  There is a 5 mm focus of delayed hyperenhancement in the lateral segment of the left liver, questionable for a second metastatic deposit. -PET CT scan on 03/05/2018 shows hypermetabolic rectal mass.  Left perirectal hypermetabolic and sigmoid mesocolon lymph nodes.  Single hepatic metastatic lesion in the right hepatic lobe.  7 mm pulmonary nodule at the left lung base is suspicious for meta stasis. -We discussed the liver biopsy results dated 03/09/2018 consistent with adenocarcinoma. -She can be considered as having oligo metastatic disease.  I will obtain foundation 1 testing.  We will also obtain MSI testing. - I have recommended primary treatment with chemotherapy  followed by restaging scans in 2 months.  This will be followed by short course radiation therapy and synchronous resection of liver metastasis and rectal lesion. - We talked about various chemotherapy options including standard FOLFOX-based regimen.  I think she will benefit from FOLFOXIRI regimen with bevacizumab given her Emmitt age and good performance status. - She will need port for chemotherapy administration.  We talked briefly about side effects of chemotherapy.  I plan to start out with FOLFOX and bevacizumab and add Irinotecan during second cycle.  2.  Thyroid cancer: -She had a left thyroidectomy done about 6 years ago.  She is on Synthroid at this time. -We plan to check her TSH, thyroglobulin and antithyroglobulin antibodies in later part of this month.

## 2018-03-13 NOTE — Progress Notes (Unsigned)
Extensive education packet provided to patient.  Diagnosis and staging discussed.  Discussed chemotherapy regimen in detail. Medications and side effects reviewed, how to manage side effects discussed.  Reviewed nausea/vomiting/diarrhea and when to call the clinic.  Reviewed infection prevention and importance of fevers. Reviewed contraception and intimacy during chemotherapy treatments.  Discussed how to reach nurse navigator and how to reach clinic after hours.  Patient provided with copy of support staff phone numbers.  Patient and husband were given time to ask questions and all were answered to their satisfaction. Consent will be obtained tomorrow prior to treatment. Patient aware of appointment times.

## 2018-03-13 NOTE — Patient Instructions (Signed)
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  We are going to be treating you with chemotherapy with palliative intent. This means that your cancer is not curable but it is treatable.  We are going to be treating you with the FOLFOX regimen. This is Fluorouracil (5-FU, Adrucil ), Oxaliplatin (Eloxatin), Leucovorin.  We will be giving you the 5-FU through an ambulatory pump that you will take home and you will return in 2 days to have it discontinued.  This will be given every 2 weeks. You will see the doctor regularly throughout treatment.  We monitor your lab work prior to every treatment. The doctor monitors your response to treatment by the way you are feeling, your blood work, and scans periodically.  There will be wait times while you are here for treatment.  It will take about 30 minutes to 1 hour for your lab work to result.  Then there will be wait times while pharmacy mixes your medications.   You will have pre-medications that you receive prior to each chemotherapy: Premeds: Aloxi - high powered nausea/vomiting prevention medication used for chemotherapy patients.  Dexamethasone - steroid - given to reduce the risk of you having an allergic type reaction to the chemotherapy. Dexamethasone can cause you to feel energized, nervous/anxious/jittery, make you have trouble sleeping, and/or make you feel hot/flushed in the face/neck and/or look pink/red in the face/neck. These side effects will pass as the medication wears off.   POTENTIAL SIDE EFFECTS OF TREATMENT: 5-Fluorouracil (Adrucil)  About This Drug Fluorouracil is used to treat cancer. It is given in the vein (IV).  This is the drug that will go home with you in your ambulatory pump.   Possible Side Effects . Hair loss. Hair loss is often temporary, although with certain medicine, hair loss can sometimes be permanent. Hair loss may happen suddenly or gradually. If you lose  hair, you may lose it from your head, face, armpits, pubic area, chest, and/or legs. You may also notice your hair getting thin. . Changes in your nail color, nail loss and/or brittle nail . Darkening of the skin, or changes to the color of your skin and/or veins used for infusion . Rash, itching . Nausea and throwing up (vomiting) . Loose bowel movements (diarrhea) . Ulcers - sores that may cause pain or bleeding in your digestive tract, which includes your mouth, esophagus, stomach, small/large intestines and rectum . Soreness of the mouth and throat. You may have red areas, white patches, or sores that hurt. . Decreased appetite (decreased hunger) . Changes in the tissue of the heart and/or heart attack. Some changes may happen that can cause your heart to have less ability to pump blood. . Bone marrow depression. This is a decrease in the number of white blood cells, red blood cells, and platelets. This may raise your risk of infection, make you tired and weak (fatigue), and raise your risk of bleeding . Sensitivity to light (photosensitivity). Photosensitivity means that you may become more sensitive to the sun and/or light. Your eyes may water more, mostly in bright light. . Allergic reaction: Allergic reactions, including anaphylaxis are rare but may happen in some patients. Signs of allergic reaction to this drug may be swelling of the face, feeling like your tongue or throat are swelling, trouble breathing, rash, itching, fever, chills, feeling dizzy, and/or feeling that your heart is beating in a fast or not normal way. If this happens,  do not take another dose of this drug. You should get urgent medical treatment. . Blurred vision or other changes in eyesight Note: Not all possible side effects are included above.  Warnings and Precautions . Hand-and-foot syndrome. The palms of your hands or soles of your feet may tingle, become numb, painful, swollen, or red. . Changes in your central  nervous system can happen. The central nervous system is made up of your brain and spinal cord. You could feel extreme tiredness, agitation, confusion, hallucinations (see or hear things that are not there), trouble understanding or speaking, loss of control of your bowels or bladder, eyesight changes, numbness or lack of strength to your arms, legs, face, or body, and coma. If you start to have any of these symptoms let your doctor know right away. . Side effects of this drug may be unexpectedly severe in some patients Note: Some of the side effects above are very rare. If you have concerns and/or questions, please discuss them with your medical team.  Important Information . This drug may be present in the saliva, tears, sweat, urine, stool, vomit, semen, and vaginal secretions. Talk to your doctor and/or your nurse about the necessary precautions to take during this time.  Treating Side Effects . To help with hair loss, wash with a mild shampoo and avoid washing your hair every day. . Avoid rubbing your scalp, pat your hair or scalp dry. . Avoid coloring your hair. . Limit your use of hair spray, electric curlers, blow dryers, and curling irons. . If you are interested in getting a wig, talk to your nurse. You can also call the [place] at [phone] to find out information about the "Look Good, Feel Better" program close to where you live. It is a free program where women getting chemotherapy can learn about wigs, turbans and scarves as well as makeup techniques and skin and nail care. Marland Kitchen Keeping your nails moisturized may help with brittleness. . To help with itching, moisturize your skin several times day. . Avoid sun exposure and apply sunscreen routinely when outdoors. . If you get a rash do not put anything on it unless your doctor or nurse says you may. Keep the area around the rash clean and dry. Ask your doctor for medicine if your rash bothers you. . To help with  decreased appetite, eat small, frequent meals. . Eat high caloric food such as pudding, ice cream, yogurt and milkshakes. . Drink plenty of fluids (a minimum of eight glasses per day is recommended). . If you throw up or have loose bowel movements, you should drink more fluids so that you do not become dehydrated (lack water in the body from losing too much fluid). . To help with nausea and vomiting, eat small, frequent meals instead of three large meals a day. Choose foods and drinks that are at room temperature. Ask your nurse or doctor about other helpful tips and medicine that is available to help or stop lessen these symptoms. . If you get diarrhea, eat low-fiber foods that are high in protein and calories and avoid foods that can irritate your digestive tracts or lead to cramping. . Ask your nurse or doctor about medicine that can lessen or stop your diarrhea. . Mouth care is very important. Your mouth care should consist of routine, gentle cleaning of your teeth or dentures and rinsing your mouth with a mixture of 1/2 teaspoon of salt in 8 ounces of water or  teaspoon of baking  soda in 8 ounces of water. This should be done at least after each meal and at bedtime. . If you have mouth sores, avoid mouthwash that has alcohol. Also avoid alcohol and smoking because they can bother your mouth and throat. . Manage tiredness by pacing your activities for the day. . Be sure to include periods of rest between energy-draining activities. . To help decrease your risk of infections, wash your hands regularly. . Avoid close contact with people who have a cold, the flu, or other infections. . Use a soft toothbrush. Check with your nurse before using dental floss. . Be very careful when using knives or tools. . Use an electric shaver instead of a razor.  Food and Drug Interactions . There are no known interactions of fluorouracil with food. . Check with your doctor or pharmacist about all other  prescription medicines and dietary supplements you are taking before starting this medicine as there are a lot of known drug interactions with fluorouracil. Also, check with your doctor or pharmacist before starting any new prescription or over-the-counter medicines, or dietary supplement to make sure that there are no interactions.  When to Call the Doctor Call your doctor or nurse if you have any of these symptoms and/or any new or unusual symptoms: . Fever of 100.5 F (38 C) or higher . Chills . Easy bleeding or bruising . Trouble breathing . Feeling dizzy or lightheaded . Feeling that your heart is beating in a fast or not normal way (palpitations) . Chest pain or symptoms of a heart attack. Most heart attacks involve pain in the center of the chest that lasts more than a few minutes. The pain may go away and come back or it can be constant. It can feel like pressure, squeezing, fullness, or pain. Sometimes pain is felt in one or both arms, the back, neck, jaw, or stomach. If any of these symptoms last 2 minutes, call 911. Marland Kitchen Confusion and/or agitation . Hallucinations . Trouble understanding or speaking . Blurry vision or changes in your eyesight . Numbness or lack of strength to your arms, legs, face, or body . Nausea that stops you from eating or drinking and/or is not relieved by prescribed medicines . Throwing up more than 3 times a day . Loose bowel movements (diarrhea) 4 times a day or loose bowel movements with lack of strength or a feeling of being dizzy . Lasting loss of appetite or rapid weight loss of five pounds in a week . Pain in your mouth or throat that makes it hard to eat or drink . Pain along the digestive tract - especially if worse after eating . Blood in your vomit (bright red or coffee-ground) and/or stools (bright red, or black/tarry) . Coughing up blood . Fatigue that interferes with your daily activities . Painful, red, or swollen areas on your hands or feet .  Numbness and/or tingling of your hands and/or feet . Signs of allergic reaction: swelling of the face, feeling like your tongue or throat are swelling, trouble breathing, rash, itching, fever, chills, feeling dizzy, and/or feeling that your heart is beating in a fast or not normal way . If you think you are pregnant or may have impregnated your partner  Reproduction Warnings . Pregnancy warning: This drug may have harmful effects on the unborn baby. Women of child bearing potential should use effective methods of birth control during your cancer treatment. Let your doctor know right away if you think you may be pregnant. Marland Kitchen  Breastfeeding warning: It is not known if this drug passes into breast milk. For this reason, women should talk to their doctor about the risks and benefits of breast feeding during treatment with this drug because this drug may enter the breast milk and cause harm to a breast feeding baby. . Fertility warning: In men and women both, this drug may affect your ability to have children in the future. Talk with your doctor or nurse if you plan to have children. Ask for information on sperm or egg banking.  Leucovorin Calcium (Generic Name) Other Names: Leucovorin, Wellcovorin, Folinic Acid, Citrovorum Factor  About This Drug Leucovorin is a vitamin. It is used in combination with other cancer fighting drugs such as 5-fluorouracil and methotrexate. Leucovorin helps 5-fluorouracil kill the cancer cells. It also helps to reduce the side effects of methotrexate. Leucovorin is given in the vein (IV) or by mouth (orally).  Will take 2 hours to infuse.  Possible Side Effects . Rash . Wheezing Leucovorin by itself has very few side effects. Side effects you may have can be caused by the other drugs you are taking, such as 5-fluorouracil or methotrexate.  Allergic Reactions . Allergic reactions to this drug are rare. While you are getting this drug by in your vein (IV), please tell your  nurse right away if you have any of these symptoms of an allergic reaction: . Trouble catching your breath . Feeling like your tongue or throat are swelling . Feeling your heart beat quickly or in a not normal way (palpitations) . Feeling dizzy or lightheaded . Flushing, itching, rash, and/or hives . If you are taking the oral form of leucovorin and you have these symptoms, do not take another dose of this drug and get urgent medical treatment.  Treating Side Effects If you get a rash do not put anything on it unless your doctor or nurse says you may. Keep the area around the rash clean and dry. Ask your doctor for medicine if your rash bothers you.  Food and Drug Interactions There are no known interactions of leucovorin with food. This drug may interact with other medicines. Tell your doctor and pharmacist about all the medicines and dietary supplements (vitamins, minerals, herbs and others) that you are taking at this time. The safety and use of dietary supplements and alternative diets are often not known. Using these might affect your cancer or interfere with your treatment. Until more is known, you should not use dietary supplements or alternative diets without your cancer doctor's help.  When to Call the Doctor . Call your doctor or nurse right away if you have any of these symptoms: . Wheezing or trouble breathing . Rash with or without itching, redness, or hives . If you take leucovorin by mouth, please also call your doctor right away if you have: Marland Kitchen Throwing up (vomiting) . Loose bowel movements (diarrhea)  Reproduction Concerns . Pregnancy warning: It is not known if this drug may harm an unborn child. For this reason, be sure to talk with your doctor if you are pregnant or planning to become pregnant while getting this drug. . Genetic counseling is available for you to talk about the effects of this drug therapy on future pregnancies. Also, a genetic counselor can look at the  possible risk of problems in the unborn baby due to this medicine if an exposure happens during pregnancy. . Breast feeding warning: It is not known if this drug passes into breast milk. For this reason,  women should talk to their doctor about the risks and benefits of breast feeding during treatment with this drug because this drug may enter the breast milk and badly harm a breast feeding baby.   Oxaliplatin (Generic Name) Other Names: EloxatinTM  About This Drug Oxaliplatin is used to treat cancer. It is given in the vein (IV).  Will take 2 hours to infuse and will infuse with the leucovorin.       Possible Side Effects (More Common) . Effects on the nerves are called peripheral neuropathy. You may feel numbness, tingling, or pain in your hands and feet. Cold temperatures can make it worse. Avoid cold drinks with ice. Dress warmly and cover the skin if you go out in the cold. Wear socks or gloves if you have to touch cold objects like cold flooring or items in the refrigerator or freezer. . It may be hard for you to button your clothes, open jars, or walk as usual. The effect on the nerves may get worse with more doses of the drug. These effects get better in some people after the drug is stopped but it does not get better in all people . Bone marrow depression: This is a decrease in the number of white blood cells, red blood cells, and platelets. It may increase your risk for infection, make you tired and weak (fatigue), and raise your risk of bleeding. . Nausea and throwing up (vomiting). These symptoms may happen within a few hours after your treatment and may last up to 72 hours. Medicines are available to stop or lessen these side effects. . Electrolyte changes. Your blood will be checked for electrolyte changes as needed.  Possible Side Effects (Less Common) . Skin and tissue irritation: This may involve redness, pain, warmth, or swelling at the IV site. This happens if the drug leaks  out of the vein and into nearby tissue. . Serious allergic reactions including anaphylaxis are rare. While you are getting this drug in your vein (IV), tell your nurse right away if you have any of these symptoms of an allergic reaction: . Trouble catching your breath. . Feeling like your tongue or throat are swelling. . Feeling your heart beat quickly or in a not normal way (palpitations). . Feeling dizzy or lightheaded. . Flushing, itching, rash, or hives. . Changes in your liver function. Your doctor will check your liver function as needed.  Treating Side Effects . Do not drink cold drinks or use ice cubes in drinks. Drink fluids at room temperature or warmer. Drink through a straw. . Wear gloves to touch cold objects. Be aware that most metals are cold to the touch, especially in winter. Examples are your car door handle and your mail box latch. . Wear warm clothing in cold weather at all times. Cover your mouth and nose with a scarf or a pulldown cap (ski cap) to warm the air that goes to your lungs. . Ask your doctor or nurse about medicine to treat nausea, throwing up, headache, or loose bowel movements. . While you are getting this drug in your vein, tell your nurse right away if you have any pain, redness, or swelling at the site of the IV infusion. . Mouth care is very important. Your mouth care should consist of routine, gentle cleaning of your teeth or dentures and rinsing your mouth with a mixture of 1/2 teaspoon of salt in 8 ounces of water or  teaspoon of baking soda in 8 ounces of water. This  should be done at least after each meal and at bedtime. . Drink 6-8 cups of fluids each day unless your doctor has told you to limit your fluid intake due to some other health problem. A cup is 8 ounces of fluid. If you throw up or have loose bowel movements you should drink more fluids so that you do not become dehydrated (lack water in the body due to losing too much fluid).  Food and  Drug Interactions There are no known interactions of oxaliplatin with food. This drug may interact with other medicines. Tell your doctor and pharmacist about all the medicines and dietary supplements (vitamins, minerals, herbs and others) that you are taking at this time. The safety and use of dietary supplements and alternative diets are often not known. Using these might affect your cancer or interfere with your treatment. Until more is known, you should not use dietary supplements or alternative diets without your cancer doctor's help.  When to Call the Doctor Call your doctor or nurse right away if you have any of these symptoms: . Fever of 100.5 F (38 C) or above . Chills . Easy bruising or bleeding . Wheezing or trouble breathing . Rash or itching . Feeling dizzy or lightheaded . Feeling that your heart is beating in a fast or not normal way (palpitations) . Loose bowel movements (diarrhea) more than 4 times a day or diarrhea with weakness or feeling lightheaded . Blurred vision or other changes in eyesight . Pain when passing urine; blood in urine . Pain in your lower back or side . Feeling confused or agitated . Nausea that stops you from eating or drinking . Throwing up more than 3 times a day . Chest pain or symptoms of a heart attack. Most heart attacks involve pain in the center of the chest that lasts more than a few minutes. The pain may go away and come back. It can feel like pressure, squeezing, fullness, or pain. Sometimes pain is felt in one or both arms, the back, neck, jaw, or stomach. If any of these symptoms last 2 minutes, call 911. Marland Kitchen Symptoms of a stroke such as sudden numbness or weakness of your face, arm, or leg, mostly on one side of your body; sudden confusion, trouble speaking or understanding; sudden trouble seeing in one or both eyes; sudden trouble walking, feeling dizzy, loss of balance or coordination; or sudden, bad headache with no known cause. If you have  any of these symptoms for 2 minutes, call 911. . Signs of liver problems: dark urine, pale bowel movements, bad stomach pain, feeling very tired and weak, itching, or yellowing of the eyes or skin. Call your doctor or nurse as soon as possible if any of these symptoms happen: . Change in hearing, ringing in the ears . Decreased urine . Unusual thirst or passing urine often . Pain in your mouth or throat that makes it hard to eat or drink . Nausea that is not relieved by prescribed medicines . Rash that is not relieved by prescribed medicines . Heavy menstrual period that lasts longer than normal . Numbness, tingling, decreased feeling or weakness in fingers, toes, arms, or legs . Trouble walking or changes in the way you walk, feeling clumsy when buttoning clothes, opening jars, or other routine hand motions . Swelling of legs, ankles, or feet . Weight gain of 5 pounds in one week (fluid retention) . Lasting loss of appetite or rapid weight loss of five pounds in a  week . Fatigue that interferes with your daily activities . Headache that does not go away . Painful, red, or swollen areas on your hands or feet. . No bowel movement for 3 days or you feel uncomfortable . Extreme weakness that interferes with normal activities  Reproduction Concerns Infertility Warning: Sexual problems and reproduction concerns may happen. In both men and women, this drug may affect your ability to have children. This cannot be determined before your treatment. Talk with your doctor or nurse if you plan to have children. Ask for information on sperm or egg banking. In men, this drug may interfere with your ability to make sperm, but it should not change your ability to have sexual relations. In women, menstrual bleeding may become irregular or stop while you are getting this drug. Do not assume that you cannot become pregnant if you do not have a menstrual period. Women may go through signs of menopause (change of  life) like vaginal dryness or itching. Vaginal lubricants can be used to lessen vaginal dryness, itching, and pain during sexual relations. Genetic counseling is available for you to talk about the effects of this drug therapy on future pregnancies. Also, a genetic counselor can look at the possible risk of problems in the unborn baby due to this medicine if an exposure happens during pregnancy. Pregnancy warning: This drug may have harmful effects on the unborn child, so effective methods of birth control should be used during your cancer treatment. Breast feeding warning: It is not known if this drug passes into breast milk. For this reason, women should talk to their doctor about the risks and benefits of breast feeding during treatment with this drug because this drug may enter the breast milk and badly harm a breast feeding baby.  SELF CARE ACTIVITIES WHILE ON CHEMOTHERAPY:  Hydration Increase your fluid intake 48 hours prior to treatment and drink at least 8 to 12 cups (64 ounces) of water/decaffeinated beverages per day after treatment. You can still have your cup of coffee or soda but these beverages do not count as part of your 8 to 12 cups that you need to drink daily. No alcohol intake.  Medications Continue taking your normal prescription medication as prescribed.  If you start any new herbal or new supplements please let us know first to make sure it is safe.  Mouth Care Have teeth cleaned professionally before starting treatment. Keep dentures and partial plates clean. Use soft toothbrush and do not use mouthwashes that contain alcohol. Biotene is a good mouthwash that is available at most pharmacies or may be ordered by calling [phone]. Use warm salt water gargles (1 teaspoon salt per 1 quart warm water) before and after meals and at bedtime. Or you may rinse with 2 tablespoons of three-percent hydrogen peroxide mixed in eight ounces of water. If you are still having problems with  your mouth or sores in your mouth please call the clinic. If you need dental work, please let the doctor know before you go for your appointment so that we can coordinate the best possible time for you in regards to your chemo regimen. You need to also let your dentist know that you are actively taking chemo. We may need to do labs prior to your dental appointment.  Skin Care Always use sunscreen that has not expired and with SPF (Sun Protection Factor) of 50 or higher. Wear hats to protect your head from the sun. Remember to use sunscreen on your hands, ears,  face, & feet.  Use good moisturizing lotions such as udder cream, eucerin, or even Vaseline. Some chemotherapies can cause dry skin, color changes in your skin and nails.    . Avoid long, hot showers or baths. . Use gentle, fragrance-free soaps and laundry detergent. . Use moisturizers, preferably creams or ointments rather than lotions because the thicker consistency is better at preventing skin dehydration. Apply the cream or ointment within 15 minutes of showering. Reapply moisturizer at night, and moisturize your hands every time after you wash them.  Hair Loss (if your doctor says your hair will fall out)  . If your doctor says that your hair is likely to fall out, decide before you begin chemo whether you want to wear a wig. You may want to shop before treatment to match your hair color. . Hats, turbans, and scarves can also camouflage hair loss, although some people prefer to leave their heads uncovered. If you go bare-headed outdoors, be sure to use sunscreen on your scalp. . Cut your hair short. It eases the inconvenience of shedding lots of hair, but it also can reduce the emotional impact of watching your hair fall out. . Don't perm or color your hair during chemotherapy. Those chemical treatments are already damaging to hair and can enhance hair loss. Once your chemo treatments are done and your hair has grown back, it's OK to resume  dyeing or perming hair. With chemotherapy, hair loss is almost always temporary. But when it grows back, it may be a different color or texture. In older adults who still had hair color before chemotherapy, the new growth may be completely gray.  Often, new hair is very fine and soft.  Infection Prevention Please wash your hands for at least 30 seconds using warm soapy water. Handwashing is the #1 way to prevent the spread of germs. Stay away from sick people or people who are getting over a cold. If you develop respiratory systems such as green/yellow mucus production or productive cough or persistent cough let us know and we will see if you need an antibiotic. It is a good idea to keep a pair of gloves on when going into grocery stores/[employer] to decrease your risk of coming into contact with germs on the carts, etc. Carry alcohol hand gel with you at all times and use it frequently if out in public. If your temperature reaches 100.5 or higher please call the clinic and let us know.  If it is after hours or on the weekend please go to the ER if your temperature is over 100.5.  Please have your own personal thermometer at home to use.    Sex and bodily fluids If you are going to have sex, a condom must be used to protect the person that isn't taking chemotherapy. Chemo can decrease your libido (sex drive). For a few days after chemotherapy, chemotherapy can be excreted through your bodily fluids.  When using the toilet please close the lid and flush the toilet twice.  Do this for a few day after you have had chemotherapy.   Effects of chemotherapy on your sex life Some changes are simple and won't last long. They won't affect your sex life permanently. Sometimes you may feel: . too tired . not strong enough to be very active . sick or sore  . not in the mood . anxious or low Your anxiety might not seem related to sex. For example, you may be worried about the cancer and how  your treatment is going.  Or you may be worried about money, or about how you family are coping with your illness. These things can cause stress, which can affect your interest in sex. It's important to talk to your partner about how you feel. Remember - the changes to your sex life don't usually last long. There's usually no medical reason to stop having sex during chemo. The drugs won't have any long term physical effects on your performance or enjoyment of sex. Cancer can't be passed on to your partner during sex  Contraception It's important to use reliable contraception during treatment. Avoid getting pregnant while you or your partner are having chemotherapy. This is because the drugs may harm the baby. Sometimes chemotherapy drugs can leave a man or woman infertile.  This means you would not be able to have children in the future. You might want to talk to someone about permanent infertility. It can be very difficult to learn that you may no longer be able to have children. Some people find counselling helpful. There might be ways to preserve your fertility, although this is easier for men than for women. You may want to speak to a fertility expert. You can talk about sperm banking or harvesting your eggs. You can also ask about other fertility options, such as donor eggs. If you have or have had breast cancer, your doctor might advise you not to take the contraceptive pill. This is because the hormones in it might affect the cancer.  It is not known for sure whether or not chemotherapy drugs can be passed on through semen or secretions from the vagina. Because of this some doctors advise people to use a barrier method if you have sex during treatment. This applies to vaginal, anal or oral sex. Generally, doctors advise a barrier method only for the time you are actually having the treatment and for about a week after your treatment. Advice like this can be worrying, but this does not mean that you have to avoid being  intimate with your partner. You can still have close contact with your partner and continue to enjoy sex.  Animals If you have cats or birds we just ask that you not change the litter or change the cage.  Please have someone else do this for you while you are on chemotherapy.   Food Safety During and After Cancer Treatment Food safety is important for people both during and after cancer treatment. Cancer and cancer treatments, such as chemotherapy, radiation therapy, and stem cell/bone marrow transplantation, often weaken the immune system. This makes it harder for your body to protect itself from foodborne illness, also called food poisoning. Foodborne illness is caused by eating food that contains harmful bacteria, parasites, or viruses.  Foods to avoid Some foods have a higher risk of becoming tainted with bacteria. These include: Marland Kitchen Unwashed fresh fruit and vegetables, especially leafy vegetables that can hide dirt and other contaminants . Raw sprouts, such as alfalfa sprouts . Raw or undercooked beef, especially ground beef, or other raw or undercooked meat and poultry . Fatty, fried, or spicy foods immediately before or after treatment.  These can sit heavy on your stomach and make you feel nauseous. . Raw or undercooked shellfish, such as oysters. . Sushi and sashimi, which often contain raw fish.  . Unpasteurized beverages, such as unpasteurized fruit juices, raw milk, raw yogurt, or cider . Undercooked eggs, such as soft boiled, over easy, and poached; raw, unpasteurized eggs; or  foods made with raw egg, such as homemade raw cookie dough and homemade mayonnaise Simple steps for food safety Shop smart. . Do not buy food stored or displayed in an unclean area. . Do not buy bruised or damaged fruits or vegetables. . Do not buy cans that have cracks, dents, or bulges. . Pick up foods that can spoil at the end of your shopping trip and store them in a cooler on the way home. Prepare and  clean up foods carefully. . Rinse all fresh fruits and vegetables under running water, and dry them with a clean towel or paper towel. . Clean the top of cans before opening them. . After preparing food, wash your hands for 20 seconds with hot water and soap. Pay special attention to areas between fingers and under nails. . Clean your utensils and dishes with hot water and soap. Marland Kitchen Disinfect your kitchen and cutting boards using 1 teaspoon of liquid, unscented bleach mixed into 1 quart of water.   Dispose of old food. . Eat canned and packaged food before its expiration date (the "use by" or "best before" date). . Consume refrigerated leftovers within 3 to 4 days. After that time, throw out the food. Even if the food does not smell or look spoiled, it still may be unsafe. Some bacteria, such as Listeria, can grow even on foods stored in the refrigerator if they are kept for too long. Take precautions when eating out. . At restaurants, avoid buffets and salad bars where food sits out for a long time and comes in contact with many people. Food can become contaminated when someone with a virus, often a norovirus, or another "bug" handles it. . Put any leftover food in a "to-go" container yourself, rather than having the server do it. And, refrigerate leftovers as soon as you get home. . Choose restaurants that are clean and that are willing to prepare your food as you order it cooked.   MEDICATIONS:                                                                                                                                                                Compazine/Prochlorperazine 10mg  tablet. Take 1 tablet every 6 hours as needed for nausea/vomiting. (This can make you sleepy)   EMLA cream. Apply a quarter size amount to port site 1 hour prior to chemo. Do not rub in. Cover with plastic wrap.   Over-the-Counter Meds:  Colace - 100 mg capsules - take 2 capsules daily.  If this doesn't help  then you can increase to 2 capsules twice daily.  Call us if this does not help your bowels move.   Imodium 2mg  capsule. Take 2 capsules after the 1st loose stool and then 1 capsule every 2  hours until you go a total of 12 hours without having a loose stool. Call the [facility] if loose stools continue. If diarrhea occurs at bedtime, take 2 capsules at bedtime. Then take 2 capsules every 4 hours until morning. Call [facility].    Diarrhea Sheet   If you are having loose stools/diarrhea, please purchase Imodium and begin taking as outlined:  At the first sign of poorly formed or loose stools you should begin taking Imodium (loperamide) 2 mg capsules.  Take two caplets (4mg ) followed by one caplet (2mg ) every 2 hours until you have had no diarrhea for 12 hours.  During the night take two caplets (4mg ) at bedtime and continue every 4 hours during the night until the morning.  Stop taking Imodium only after there is no sign of diarrhea for 12 hours.    Always call the [facility] if you are having loose stools/diarrhea that you can't get under control.  Loose stools/diarrhea leads to dehydration (loss of water) in your body.  We have other options of trying to get the loose stools/diarrhea to stop but you must let us know!   Constipation Sheet  Colace - 100 mg capsules - take 2 capsules daily.  If this doesn't help then you can increase to 2 capsules twice daily.  Please call if the above does not work for you.   Do not go more than 2 days without a bowel movement.  It is very important that you do not become constipated.  It will make you feel sick to your stomach (nausea) and can cause abdominal pain and vomiting.   Nausea Sheet   Compazine/Prochlorperazine 10mg  tablet. Take 1 tablet every 6 hours as needed for nausea/vomiting. (This can make you sleepy)  If you are having persistent nausea (nausea that does not stop) please call the [facility] and let us know the amount of  nausea that you are experiencing.  If you begin to vomit, you need to call the [facility] and if it is the weekend and you have vomited more than one time and can't get it to stop-go to the Emergency Room.  Persistent nausea/vomiting can lead to dehydration (loss of fluid in your body) and will make you feel terrible.   Ice chips, sips of clear liquids, foods that are @ room temperature, crackers, and toast tend to be better tolerated.   SYMPTOMS TO REPORT AS SOON AS POSSIBLE AFTER TREATMENT:   FEVER GREATER THAN 100.5 F  CHILLS WITH OR WITHOUT FEVER  NAUSEA AND VOMITING THAT IS NOT CONTROLLED WITH YOUR NAUSEA MEDICATION  UNUSUAL SHORTNESS OF BREATH  UNUSUAL BRUISING OR BLEEDING  TENDERNESS IN MOUTH AND THROAT WITH OR WITHOUT PRESENCE OF ULCERS  URINARY PROBLEMS  BOWEL PROBLEMS  UNUSUAL RASH      Wear comfortable clothing and clothing appropriate for easy access to any Portacath or PICC line. Let us know if there is anything that we can do to make your therapy better!    What to do if you need assistance after hours or on the weekends: CALL [phone].  HOLD on the line, do not hang up.  You will hear multiple messages but at the end you will be connected with a nurse triage line.  They will contact the doctor if necessary.  Most of the time they will be able to assist you.  Do not call the hospital operator.      I have been informed and understand all of the instructions  given to me and have received a copy. I have been instructed to call the clinic [phone] or my family physician as soon as possible for continued medical care, if indicated. I do not have any more questions at this time but understand that I may call the [facility] or the Patient Navigator at [phone] during office hours should I have questions or need assistance in obtaining follow-up care.

## 2018-03-14 ENCOUNTER — Ambulatory Visit (HOSPITAL_COMMUNITY): Payer: 59

## 2018-03-14 ENCOUNTER — Ambulatory Visit (HOSPITAL_COMMUNITY): Payer: 59 | Admitting: Anesthesiology

## 2018-03-14 ENCOUNTER — Encounter (HOSPITAL_COMMUNITY)
Admission: RE | Disposition: A | Discharge status: Home / Self Care | Payer: Self-pay | Source: Home / Self Care | Attending: General Surgery

## 2018-03-14 ENCOUNTER — Ambulatory Visit (HOSPITAL_COMMUNITY)
Admission: RE | Admit: 2018-03-14 | Discharge: 2018-03-14 | Disposition: A | Discharge status: Home / Self Care | Payer: 59 | Attending: General Surgery | Admitting: General Surgery

## 2018-03-14 ENCOUNTER — Encounter (HOSPITAL_COMMUNITY): Payer: Self-pay | Admitting: Anesthesiology

## 2018-03-14 VITALS — BP 117/78 | HR 92 | Temp 97.7°F | Resp 18

## 2018-03-14 DIAGNOSIS — F17210 Nicotine dependence, cigarettes, uncomplicated: Secondary | ICD-10-CM | POA: Insufficient documentation

## 2018-03-14 DIAGNOSIS — Z87891 Personal history of nicotine dependence: Secondary | ICD-10-CM | POA: Diagnosis not present

## 2018-03-14 DIAGNOSIS — Z79890 Hormone replacement therapy (postmenopausal): Secondary | ICD-10-CM | POA: Insufficient documentation

## 2018-03-14 DIAGNOSIS — F419 Anxiety disorder, unspecified: Secondary | ICD-10-CM | POA: Insufficient documentation

## 2018-03-14 DIAGNOSIS — Z801 Family history of malignant neoplasm of trachea, bronchus and lung: Secondary | ICD-10-CM | POA: Diagnosis not present

## 2018-03-14 DIAGNOSIS — K219 Gastro-esophageal reflux disease without esophagitis: Secondary | ICD-10-CM | POA: Diagnosis not present

## 2018-03-14 DIAGNOSIS — E890 Postprocedural hypothyroidism: Secondary | ICD-10-CM | POA: Insufficient documentation

## 2018-03-14 DIAGNOSIS — C20 Malignant neoplasm of rectum: Secondary | ICD-10-CM | POA: Diagnosis not present

## 2018-03-14 DIAGNOSIS — E039 Hypothyroidism, unspecified: Secondary | ICD-10-CM | POA: Diagnosis not present

## 2018-03-14 DIAGNOSIS — Z8550 Personal history of malignant neoplasm of unspecified urinary tract organ: Secondary | ICD-10-CM | POA: Diagnosis not present

## 2018-03-14 DIAGNOSIS — Z8042 Family history of malignant neoplasm of prostate: Secondary | ICD-10-CM | POA: Insufficient documentation

## 2018-03-14 DIAGNOSIS — Z451 Encounter for adjustment and management of infusion pump: Secondary | ICD-10-CM | POA: Diagnosis not present

## 2018-03-14 DIAGNOSIS — Z95828 Presence of other vascular implants and grafts: Secondary | ICD-10-CM

## 2018-03-14 HISTORY — PX: PORTACATH PLACEMENT: SHX2246

## 2018-03-14 LAB — THYROID ANTIBODIES
Thyroglobulin Antibody: <1 IU/mL (ref 0.0–0.9)
Thyroperoxidase Ab SerPl-aCnc: 63 IU/mL — ABNORMAL HIGH (ref 0–34)

## 2018-03-14 LAB — T3, FREE: T3 FREE: 6.2 pg/mL — AB (ref 2.0–4.4)

## 2018-03-14 SURGERY — INSERTION, TUNNELED CENTRAL VENOUS DEVICE, WITH PORT
Anesthesia: Monitor Anesthesia Care

## 2018-03-14 MED ORDER — LIDOCAINE HCL (PF) 1 % IJ SOLN
INTRAMUSCULAR | Status: AC
  Filled 2018-03-14: qty 30

## 2018-03-14 MED ORDER — OXALIPLATIN CHEMO INJECTION 100 MG/20ML
85.0000 mg/m2 | Freq: Once | INTRAVENOUS | Status: AC
  Administered 2018-03-14: 165 mg via INTRAVENOUS
  Filled 2018-03-14: qty 33

## 2018-03-14 MED ORDER — PROPOFOL 10 MG/ML IV BOLUS
INTRAVENOUS | Status: DC | PRN
  Administered 2018-03-14: 10 mg via INTRAVENOUS
  Administered 2018-03-14 (×5): 20 mg via INTRAVENOUS

## 2018-03-14 MED ORDER — HYDROCODONE-ACETAMINOPHEN 5-325 MG PO TABS: 1.0000 | ORAL_TABLET | ORAL | 0 refills | Status: DC | PRN

## 2018-03-14 MED ORDER — VANCOMYCIN HCL 10 G IV SOLR
INTRAVENOUS | Status: AC
  Filled 2018-03-14: qty 1500

## 2018-03-14 MED ORDER — DIPHENHYDRAMINE HCL 50 MG/ML IJ SOLN
INTRAMUSCULAR | Status: AC
  Filled 2018-03-14: qty 1

## 2018-03-14 MED ORDER — KETOROLAC TROMETHAMINE 30 MG/ML IJ SOLN
INTRAMUSCULAR | Status: AC
  Filled 2018-03-14: qty 1

## 2018-03-14 MED ORDER — DEXTROSE 5 % IV SOLN
Freq: Once | INTRAVENOUS | Status: AC
  Administered 2018-03-14: 12:00:00 via INTRAVENOUS

## 2018-03-14 MED ORDER — VANCOMYCIN HCL IN DEXTROSE 1-5 GM/200ML-% IV SOLN
INTRAVENOUS | Status: AC
  Filled 2018-03-14: qty 200

## 2018-03-14 MED ORDER — CHLORHEXIDINE GLUCONATE CLOTH 2 % EX PADS: 6.0000 | MEDICATED_PAD | Freq: Once | CUTANEOUS | Status: DC

## 2018-03-14 MED ORDER — LEUCOVORIN CALCIUM INJECTION 350 MG
800.0000 mg | Freq: Once | INTRAVENOUS | Status: AC
  Administered 2018-03-14: 800 mg via INTRAVENOUS
  Filled 2018-03-14: qty 40

## 2018-03-14 MED ORDER — SODIUM CHLORIDE 0.9 % IV SOLN
10.0000 mg | Freq: Once | INTRAVENOUS | Status: AC
  Administered 2018-03-14: 10 mg via INTRAVENOUS
  Filled 2018-03-14: qty 1

## 2018-03-14 MED ORDER — SODIUM CHLORIDE 0.9 % IV SOLN
2400.0000 mg/m2 | INTRAVENOUS | Status: DC
  Administered 2018-03-14: 4650 mg via INTRAVENOUS
  Filled 2018-03-14: qty 93

## 2018-03-14 MED ORDER — PALONOSETRON HCL INJECTION 0.25 MG/5ML
0.2500 mg | Freq: Once | INTRAVENOUS | Status: AC
  Administered 2018-03-14: 0.25 mg via INTRAVENOUS
  Filled 2018-03-14: qty 5

## 2018-03-14 MED ORDER — SODIUM CHLORIDE (PF) 0.9 % IJ SOLN
INTRAMUSCULAR | Status: DC | PRN
  Administered 2018-03-14: 50 mL via INTRAVENOUS

## 2018-03-14 MED ORDER — FLUOROURACIL CHEMO INJECTION 2.5 GM/50ML
400.0000 mg/m2 | Freq: Once | INTRAVENOUS | Status: AC
  Administered 2018-03-14: 750 mg via INTRAVENOUS
  Filled 2018-03-14: qty 15

## 2018-03-14 MED ORDER — LACTATED RINGERS IV SOLN
INTRAVENOUS | Status: DC
  Administered 2018-03-14: 08:00:00 via INTRAVENOUS

## 2018-03-14 MED ORDER — MEPERIDINE HCL 50 MG/ML IJ SOLN: 6.2500 mg | INTRAMUSCULAR | Status: DC | PRN

## 2018-03-14 MED ORDER — DIPHENHYDRAMINE HCL 50 MG/ML IJ SOLN
12.5000 mg | Freq: Once | INTRAMUSCULAR | Status: AC
  Administered 2018-03-14: 12.5 mg via INTRAVENOUS

## 2018-03-14 MED ORDER — ONDANSETRON HCL 4 MG/2ML IJ SOLN: 4.0000 mg | Freq: Once | INTRAMUSCULAR | Status: DC | PRN

## 2018-03-14 MED ORDER — PROPOFOL 500 MG/50ML IV EMUL
INTRAVENOUS | Status: DC | PRN
  Administered 2018-03-14: 10:00:00 via INTRAVENOUS
  Administered 2018-03-14: 150 ug/kg/min via INTRAVENOUS

## 2018-03-14 MED ORDER — VANCOMYCIN HCL IN DEXTROSE 1-5 GM/200ML-% IV SOLN
1000.0000 mg | INTRAVENOUS | Status: AC
  Administered 2018-03-14: 1000 mg via INTRAVENOUS

## 2018-03-14 MED ORDER — HYDROCODONE-ACETAMINOPHEN 7.5-325 MG PO TABS: 1.0000 | ORAL_TABLET | Freq: Once | ORAL | Status: DC | PRN

## 2018-03-14 MED ORDER — HEPARIN SOD (PORK) LOCK FLUSH 100 UNIT/ML IV SOLN
INTRAVENOUS | Status: AC
  Filled 2018-03-14: qty 5

## 2018-03-14 MED ORDER — KETOROLAC TROMETHAMINE 30 MG/ML IJ SOLN
30.0000 mg | Freq: Once | INTRAMUSCULAR | Status: AC | PRN
  Administered 2018-03-14: 30 mg via INTRAVENOUS

## 2018-03-14 MED ORDER — LIDOCAINE HCL (PF) 1 % IJ SOLN
INTRAMUSCULAR | Status: DC | PRN
  Administered 2018-03-14: 9 mL

## 2018-03-14 MED ORDER — FENTANYL CITRATE (PF) 100 MCG/2ML IJ SOLN
INTRAMUSCULAR | Status: AC
  Filled 2018-03-14: qty 2

## 2018-03-14 MED ORDER — MIDAZOLAM HCL 2 MG/2ML IJ SOLN
INTRAMUSCULAR | Status: DC | PRN
  Administered 2018-03-14: 2 mg via INTRAVENOUS

## 2018-03-14 MED ORDER — PROPOFOL 10 MG/ML IV BOLUS
INTRAVENOUS | Status: AC
  Filled 2018-03-14: qty 20

## 2018-03-14 MED ORDER — LIDOCAINE HCL 1 % IJ SOLN
INTRAMUSCULAR | Status: DC | PRN
  Administered 2018-03-14: 40 mg via INTRADERMAL

## 2018-03-14 MED ORDER — MIDAZOLAM HCL 2 MG/2ML IJ SOLN
INTRAMUSCULAR | Status: AC
  Filled 2018-03-14: qty 2

## 2018-03-14 MED ORDER — FENTANYL CITRATE (PF) 100 MCG/2ML IJ SOLN
INTRAMUSCULAR | Status: DC | PRN
  Administered 2018-03-14 (×2): 25 ug via INTRAVENOUS

## 2018-03-14 MED ORDER — HEPARIN SOD (PORK) LOCK FLUSH 100 UNIT/ML IV SOLN
INTRAVENOUS | Status: DC | PRN
  Administered 2018-03-14: 500 [IU] via INTRAVENOUS

## 2018-03-14 MED ORDER — DEXTROSE 5 % IV SOLN
INTRAVENOUS | Status: DC
  Administered 2018-03-14: 12:00:00 via INTRAVENOUS

## 2018-03-14 MED ORDER — ONDANSETRON HCL 4 MG/2ML IJ SOLN
INTRAMUSCULAR | Status: AC
  Filled 2018-03-14: qty 2

## 2018-03-14 MED ORDER — HYDROMORPHONE HCL 1 MG/ML IJ SOLN: 0.2500 mg | INTRAMUSCULAR | Status: DC | PRN

## 2018-03-14 SURGICAL SUPPLY — 33 items
ADH SKN CLS APL DERMABOND .7 (GAUZE/BANDAGES/DRESSINGS) ×1
BAG DECANTER FOR FLEXI CONT (MISCELLANEOUS) ×2 IMPLANT
CHLORAPREP W/TINT 10.5 ML (MISCELLANEOUS) ×2 IMPLANT
CLOTH BEACON ORANGE TIMEOUT ST (SAFETY) ×2 IMPLANT
COVER LIGHT HANDLE STERIS (MISCELLANEOUS) ×4 IMPLANT
DECANTER SPIKE VIAL GLASS SM (MISCELLANEOUS) ×2 IMPLANT
DERMABOND ADVANCED (GAUZE/BANDAGES/DRESSINGS) ×1
DERMABOND ADVANCED .7 DNX12 (GAUZE/BANDAGES/DRESSINGS) ×1 IMPLANT
DRAPE C-ARM FOLDED MOBILE STRL (DRAPES) ×2 IMPLANT
DRSG TEGADERM 4X10 (GAUZE/BANDAGES/DRESSINGS) ×1 IMPLANT
ELECT REM PT RETURN 9FT ADLT (ELECTROSURGICAL) ×2
ELECTRODE REM PT RTRN 9FT ADLT (ELECTROSURGICAL) ×1 IMPLANT
GLOVE BIOGEL PI IND STRL 7.0 (GLOVE) ×2 IMPLANT
GLOVE BIOGEL PI INDICATOR 7.0 (GLOVE) ×3
GLOVE ECLIPSE 7.0 STRL STRAW (GLOVE) ×2 IMPLANT
GLOVE SURG SS PI 7.5 STRL IVOR (GLOVE) ×2 IMPLANT
GOWN STRL REUS W/TWL LRG LVL3 (GOWN DISPOSABLE) ×4 IMPLANT
IV NS 500ML (IV SOLUTION) ×2
IV NS 500ML BAXH (IV SOLUTION) ×1 IMPLANT
KIT PORT POWER 8FR ISP MRI (Port) ×2 IMPLANT
KIT TURNOVER KIT A (KITS) ×2 IMPLANT
NDL HYPO 25X1 1.5 SAFETY (NEEDLE) ×1 IMPLANT
NEEDLE HYPO 25X1 1.5 SAFETY (NEEDLE) ×2 IMPLANT
PACK MINOR (CUSTOM PROCEDURE TRAY) ×2 IMPLANT
PAD ARMBOARD 7.5X6 YLW CONV (MISCELLANEOUS) ×2 IMPLANT
SET BASIN LINEN APH (SET/KITS/TRAYS/PACK) ×2 IMPLANT
SPONGE GAUZE 2X2 8PLY STRL LF (GAUZE/BANDAGES/DRESSINGS) ×3 IMPLANT
SUT MNCRL AB 4-0 PS2 18 (SUTURE) ×2 IMPLANT
SUT VIC AB 3-0 SH 27 (SUTURE) ×2
SUT VIC AB 3-0 SH 27X BRD (SUTURE) ×1 IMPLANT
SYR 20CC LL (SYRINGE) ×2 IMPLANT
SYR 5ML LL (SYRINGE) ×2 IMPLANT
SYR CONTROL 10ML LL (SYRINGE) ×2 IMPLANT

## 2018-03-14 NOTE — Anesthesia Postprocedure Evaluation (Signed)
Anesthesia Post Note  Patient: Samantha Reynolds  Procedure(s) Performed: INSERTION PORT-A-CATH (N/A )  Patient location during evaluation: PACU Anesthesia Type: MAC Level of consciousness: awake and alert and patient cooperative Pain management: satisfactory to patient Vital Signs Assessment: post-procedure vital signs reviewed and stable Respiratory status: spontaneous breathing Cardiovascular status: stable Postop Assessment: no apparent nausea or vomiting Anesthetic complications: no     Last Vitals:  Vitals:   03/14/18 1015 03/14/18 1044  BP: 110/79 117/85  Pulse: 79 87  Resp: 16 18  Temp:  37 C  SpO2: 95% 100%    Last Pain:  Vitals:   03/14/18 1044  TempSrc: Oral  PainSc: 0-No pain                 Pratyush Ammon

## 2018-03-14 NOTE — Transfer of Care (Signed)
Immediate Anesthesia Transfer of Care Note  Patient: Samantha Reynolds  Procedure(s) Performed: INSERTION PORT-A-CATH (N/A )  Patient Location: PACU  Anesthesia Type:MAC  Level of Consciousness: awake and alert   Airway & Oxygen Therapy: Patient Spontanous Breathing  Post-op Assessment: Report given to RN  Post vital signs: Reviewed  Last Vitals:  Vitals Value Taken Time  BP    Temp    Pulse    Resp    SpO2      Last Pain:  Vitals:   03/14/18 0806  TempSrc: Oral  PainSc: 2          Complications: No apparent anesthesia complications

## 2018-03-14 NOTE — Op Note (Signed)
Patient:  Samantha Reynolds  DOB:  20-Nov-1965  MRN:  [id number]   Preop Diagnosis: Metastatic rectal carcinoma  Postop Diagnosis: Same  Procedure: Port-A-Cath insertion  Surgeon: Aviva Signs, MD  Anes: MAC  Indications: Patient is a 52 year old white female who is about to undergo chemotherapy for metastatic rectal carcinoma.  The risks and benefits of the procedure including bleeding, infection, and pneumothorax were fully explained to the patient, who gave informed consent.  Procedure note: The patient was placed in Trendelenburg position after the left upper chest was prepped and draped using usual sterile technique with DuraPrep.  Surgical site confirmation was performed.  1% Xylocaine was used for local anesthesia.  An incision was made below the left clavicle.  A subcutaneous pocket was formed.  A needle was advanced into the left subclavian vein using Seldinger technique without difficulty.  A guidewire was then advanced into the right atrium under fluoroscopic guidance.  An introducer and peel-away sheath were placed over the guidewire.  The guidewire was removed and the catheter inserted into the peel-away sheath.  The peel-away sheath was removed and the catheter was attached to the port.  Port was then placed in subcutaneous pocket.  Adequate positioning was confirmed by fluoroscopy.  Good backflow blood was noted on aspiration of the port.  The port was left accessed.  The port was flushed with heparin flush.  Subcutaneous layer was reapproximated using a 3-0 Vicryl interrupted suture.  The skin was closed using a 4-0 Monocryl subcuticular suture.  Dermabond was applied.  All tape and needle counts were correct at the end of the procedure.  The patient was awakened and transferred to PACU in stable condition.  A chest x-ray will be performed at that time.  Complications: None  EBL: Minimal  Specimen: None

## 2018-03-14 NOTE — Discharge Instructions (Signed)
Implanted Port Home Guide °An implanted port is a device that is placed under the skin. It is usually placed in the chest. The device can be used to give IV medicine, to take blood, or for dialysis. You may have an implanted port if: °· You need IV medicine that would be irritating to the small veins in your hands or arms. °· You need IV medicines, such as antibiotics, for a long period of time. °· You need IV nutrition for a long period of time. °· You need dialysis. °Having a port means that your health care provider will not need to use the veins in your arms for these procedures. You may have fewer limitations when using a port than you would if you used other types of long-term IVs, and you will likely be able to return to normal activities after your incision heals. °An implanted port has two main parts: °· Reservoir. The reservoir is the part where a needle is inserted to give medicines or draw blood. The reservoir is round. After it is placed, it appears as a small, raised area under your skin. °· Catheter. The catheter is a thin, flexible tube that connects the reservoir to a vein. Medicine that is inserted into the reservoir goes into the catheter and then into the vein. °How is my port accessed? °To access your port: °· A numbing cream may be placed on the skin over the port site. °· Your health care provider will put on a mask and sterile gloves. °· The skin over your port will be cleaned carefully with a germ-killing soap and allowed to dry. °· Your health care provider will gently pinch the port and insert a needle into it. °· Your health care provider will check for a blood return to make sure the port is in the vein and is not clogged. °· If your port needs to remain accessed to get medicine continuously (constant infusion), your health care provider will place a clear bandage (dressing) over the needle site. The dressing and needle will need to be changed every week, or as told by your health care  provider. °What is flushing? °Flushing helps keep the port from getting clogged. Follow instructions from your health care provider about how and when to flush the port. Ports are usually flushed with saline solution or a medicine called heparin. The need for flushing will depend on how the port is used: °· If the port is only used from time to time to give medicines or draw blood, the port may need to be flushed: °? Before and after medicines have been given. °? Before and after blood has been drawn. °? As part of routine maintenance. Flushing may be recommended every 4-6 weeks. °· If a constant infusion is running, the port may not need to be flushed. °· Throw away any syringes in a disposal container that is meant for sharp items (sharps container). You can buy a sharps container from a pharmacy, or you can make one by using an empty hard plastic bottle with a cover. °How long will my port stay implanted? °The port can stay in for as long as your health care provider thinks it is needed. When it is time for the port to come out, a surgery will be done to remove it. The surgery will be similar to the procedure that was done to put the port in. °Follow these instructions at home: ° °· Flush your port as told by your health care provider. °·   If you need an infusion over several days, follow instructions from your health care provider about how to take care of your port site. Make sure you: °? Wash your hands with soap and water before you change your dressing. If soap and water are not available, use alcohol-based hand sanitizer. °? Change your dressing as told by your health care provider. °? Place any used dressings or infusion bags into a plastic bag. Throw that bag in the trash. °? Keep the dressing that covers the needle clean and dry. Do not get it wet. °? Do not use scissors or sharp objects near the tube. °? Keep the tube clamped, unless it is being used. °· Check your port site every day for signs of  infection. Check for: °? Redness, swelling, or pain. °? Fluid or blood. °? Pus or a bad smell. °· Protect the skin around the port site. °? Avoid wearing bra straps that rub or irritate the site. °? Protect the skin around your port from seat belts. Place a soft pad over your chest if needed. °· Bathe or shower as told by your health care provider. The site may get wet as long as you are not actively receiving an infusion. °· Return to your normal activities as told by your health care provider. Ask your health care provider what activities are safe for you. °· Carry a medical alert card or wear a medical alert bracelet at all times. This will let health care providers know that you have an implanted port in case of an emergency. °Get help right away if: °· You have redness, swelling, or pain at the port site. °· You have fluid or blood coming from your port site. °· You have pus or a bad smell coming from the port site. °· You have a fever. °Summary °· Implanted ports are usually placed in the chest for long-term IV access. °· Follow instructions from your health care provider about flushing the port and changing bandages (dressings). °· Take care of the area around your port by avoiding clothing that puts pressure on the area, and by watching for signs of infection. °· Protect the skin around your port from seat belts. Place a soft pad over your chest if needed. °· Get help right away if you have a fever or you have redness, swelling, pain, drainage, or a bad smell at the port site. °This information is not intended to replace advice given to you by your health care provider. Make sure you discuss any questions you have with your health care provider. °Document Released: 03/14/2005 Document Revised: 04/16/2016 Document Reviewed: 04/16/2016 °Elsevier Interactive Patient Education © 2019 [employer] ° ° ° ° °Monitored Anesthesia Care, Care After °These instructions provide you with information about caring for  yourself after your procedure. Your health care provider may also give you more specific instructions. Your treatment has been planned according to current medical practices, but problems sometimes occur. Call your health care provider if you have any problems or questions after your procedure. °What can I expect after the procedure? °After your procedure, you may: °· Feel sleepy for several hours. °· Feel clumsy and have poor balance for several hours. °· Feel forgetful about what happened after the procedure. °· Have poor judgment for several hours. °· Feel nauseous or vomit. °· Have a sore throat if you had a breathing tube during the procedure. °Follow these instructions at home: °For at least 24 hours after the procedure: ° °  ° °·   Have a responsible adult stay with you. It is important to have someone help care for you until you are awake and alert. °· Rest as needed. °· Do not: °? Participate in activities in which you could fall or become injured. °? Drive. °? Use heavy machinery. °? Drink alcohol. °? Take sleeping pills or medicines that cause drowsiness. °? Make important decisions or sign legal documents. °? Take care of children on your own. °Eating and drinking °· Follow the diet that is recommended by your health care provider. °· If you vomit, drink water, juice, or soup when you can drink without vomiting. °· Make sure you have little or no nausea before eating solid foods. °General instructions °· Take over-the-counter and prescription medicines only as told by your health care provider. °· If you have sleep apnea, surgery and certain medicines can increase your risk for breathing problems. Follow instructions from your health care provider about wearing your sleep device: °? Anytime you are sleeping, including during daytime naps. °? While taking prescription pain medicines, sleeping medicines, or medicines that make you drowsy. °· If you smoke, do not smoke without supervision. °· Keep all  follow-up visits as told by your health care provider. This is important. °Contact a health care provider if: °· You keep feeling nauseous or you keep vomiting. °· You feel light-headed. °· You develop a rash. °· You have a fever. °Get help right away if: °· You have trouble breathing. °Summary °· For several hours after your procedure, you may feel sleepy and have poor judgment. °· Have a responsible adult stay with you for at least 24 hours or until you are awake and alert. °This information is not intended to replace advice given to you by your health care provider. Make sure you discuss any questions you have with your health care provider. °Document Released: 07/05/2015 Document Revised: 10/28/2016 Document Reviewed: 07/05/2015 °Elsevier Interactive Patient Education © 2019 [employer]. ° ° °

## 2018-03-14 NOTE — Interval H&P Note (Signed)
History and Physical Interval Note:  03/14/2018 8:55 AM  Samantha Reynolds  has presented today for surgery, with the diagnosis of rectal cancer  The various methods of treatment have been discussed with the patient and family. After consideration of risks, benefits and other options for treatment, the patient has consented to  Procedure(s): INSERTION PORT-A-CATH (N/A) as a surgical intervention .  The patient's history has been reviewed, patient examined, no change in status, stable for surgery.  I have reviewed the patient's chart and labs.  Questions were answered to the patient's satisfaction.     Aviva Signs

## 2018-03-14 NOTE — Anesthesia Preprocedure Evaluation (Signed)
Anesthesia Evaluation  Patient identified by MRN, date of birth, ID band Patient awake    Reviewed: Allergy & Precautions, H&P , NPO status , Patient's Chart, lab work & pertinent test results  Airway Mallampati: II  TM Distance: >3 FB Neck ROM: full    Dental no notable dental hx.    Pulmonary neg pulmonary ROS, former smoker,    Pulmonary exam normal breath sounds clear to auscultation       Cardiovascular Exercise Tolerance: Good negative cardio ROS   Rhythm:regular Rate:Normal     Neuro/Psych PSYCHIATRIC DISORDERS Anxiety  Neuromuscular disease    GI/Hepatic Neg liver ROS, GERD  ,  Endo/Other  Hypothyroidism   Renal/GU negative Renal ROS  negative genitourinary   Musculoskeletal   Abdominal   Peds  Hematology negative hematology ROS (+)   Anesthesia Other Findings Metastatic rectal  CA   Reproductive/Obstetrics negative OB ROS                             Anesthesia Physical Anesthesia Plan  ASA: III  Anesthesia Plan: MAC   Post-op Pain Management:    Induction:   PONV Risk Score and Plan:   Airway Management Planned:   Additional Equipment:   Intra-op Plan:   Post-operative Plan:   Informed Consent: I have reviewed the patients History and Physical, chart, labs and discussed the procedure including the risks, benefits and alternatives for the proposed anesthesia with the patient or authorized representative who has indicated his/her understanding and acceptance.     Plan Discussed with: CRNA  Anesthesia Plan Comments:         Anesthesia Quick Evaluation

## 2018-03-15 ENCOUNTER — Encounter (HOSPITAL_COMMUNITY): Payer: Self-pay | Admitting: General Surgery

## 2018-03-15 NOTE — Progress Notes (Signed)
Tolerated infusions w/o adverse reaction.  Alert, in no distress.  VSS.  Discharged ambulatory in c/o spouse.  

## 2018-03-16 ENCOUNTER — Encounter (HOSPITAL_COMMUNITY): Payer: Self-pay

## 2018-03-16 ENCOUNTER — Inpatient Hospital Stay (HOSPITAL_COMMUNITY): Payer: 59

## 2018-03-16 ENCOUNTER — Other Ambulatory Visit: Payer: Self-pay

## 2018-03-16 VITALS — BP 117/76 | HR 87 | Temp 98.4°F | Resp 18

## 2018-03-16 DIAGNOSIS — C787 Secondary malignant neoplasm of liver and intrahepatic bile duct: Secondary | ICD-10-CM | POA: Diagnosis not present

## 2018-03-16 DIAGNOSIS — R911 Solitary pulmonary nodule: Secondary | ICD-10-CM | POA: Diagnosis not present

## 2018-03-16 DIAGNOSIS — C20 Malignant neoplasm of rectum: Secondary | ICD-10-CM

## 2018-03-16 DIAGNOSIS — R200 Anesthesia of skin: Secondary | ICD-10-CM | POA: Diagnosis not present

## 2018-03-16 DIAGNOSIS — K921 Melena: Secondary | ICD-10-CM | POA: Diagnosis not present

## 2018-03-16 DIAGNOSIS — R634 Abnormal weight loss: Secondary | ICD-10-CM | POA: Diagnosis not present

## 2018-03-16 DIAGNOSIS — R110 Nausea: Secondary | ICD-10-CM | POA: Diagnosis not present

## 2018-03-16 DIAGNOSIS — K5900 Constipation, unspecified: Secondary | ICD-10-CM | POA: Diagnosis not present

## 2018-03-16 DIAGNOSIS — Z5111 Encounter for antineoplastic chemotherapy: Secondary | ICD-10-CM | POA: Diagnosis not present

## 2018-03-16 MED ORDER — DEXAMETHASONE 4 MG PO TABS
4.0000 mg | ORAL_TABLET | Freq: Once | ORAL | Status: AC
  Administered 2018-03-16: 4 mg via ORAL
  Filled 2018-03-16: qty 1

## 2018-03-16 MED ORDER — DEXAMETHASONE 4 MG PO TABS: 4.0000 mg | ORAL_TABLET | Freq: Every day | ORAL | 1 refills | Status: DC

## 2018-03-16 MED ORDER — SODIUM CHLORIDE 0.9% FLUSH
10.0000 mL | INTRAVENOUS | Status: DC | PRN
  Administered 2018-03-16: 10 mL
  Filled 2018-03-16: qty 10

## 2018-03-16 MED ORDER — HEPARIN SOD (PORK) LOCK FLUSH 100 UNIT/ML IV SOLN
500.0000 [IU] | Freq: Once | INTRAVENOUS | Status: AC | PRN
  Administered 2018-03-16: 500 [IU]

## 2018-03-16 NOTE — Progress Notes (Signed)
Samantha Reynolds presents to have home infusion pump d/c'd and for port-a-cath deaccess with flush.  Proper placement of portacath confirmed by CXR.  Portacath located left chest wall accessed with  H 20 needle.  Good blood return present. Portacath flushed with NS and 500U/40ml Heparin, and needle removed intact.  Procedure tolerated well and without incident.  Discharged ambulatory.

## 2018-03-19 LAB — THYROGLOBULIN LEVEL: Thyroglobulin: 13 ng/mL

## 2018-03-26 DIAGNOSIS — C20 Malignant neoplasm of rectum: Secondary | ICD-10-CM | POA: Diagnosis not present

## 2018-03-27 ENCOUNTER — Encounter (HOSPITAL_COMMUNITY): Payer: Self-pay | Admitting: Hematology

## 2018-03-27 ENCOUNTER — Other Ambulatory Visit: Payer: Self-pay

## 2018-03-27 ENCOUNTER — Inpatient Hospital Stay (HOSPITAL_COMMUNITY): Payer: 59

## 2018-03-27 ENCOUNTER — Encounter (HOSPITAL_COMMUNITY): Payer: Self-pay | Admitting: *Deleted

## 2018-03-27 ENCOUNTER — Inpatient Hospital Stay (HOSPITAL_BASED_OUTPATIENT_CLINIC_OR_DEPARTMENT_OTHER): Payer: 59 | Admitting: Hematology

## 2018-03-27 ENCOUNTER — Emergency Department (HOSPITAL_COMMUNITY): Payer: 59

## 2018-03-27 ENCOUNTER — Inpatient Hospital Stay (HOSPITAL_COMMUNITY)
Admission: EM | Admit: 2018-03-27 | Discharge: 2018-03-29 | DRG: 871 | Disposition: A | Discharge status: Home / Self Care | Payer: 59 | Attending: Family Medicine | Admitting: Family Medicine

## 2018-03-27 VITALS — BP 105/73 | HR 87 | Temp 97.7°F | Resp 18 | Wt 171.1 lb

## 2018-03-27 VITALS — BP 114/72 | HR 75 | Temp 98.1°F | Resp 18

## 2018-03-27 DIAGNOSIS — D61810 Antineoplastic chemotherapy induced pancytopenia: Secondary | ICD-10-CM | POA: Diagnosis present

## 2018-03-27 DIAGNOSIS — Z791 Long term (current) use of non-steroidal anti-inflammatories (NSAID): Secondary | ICD-10-CM

## 2018-03-27 DIAGNOSIS — Z9049 Acquired absence of other specified parts of digestive tract: Secondary | ICD-10-CM | POA: Diagnosis not present

## 2018-03-27 DIAGNOSIS — C787 Secondary malignant neoplasm of liver and intrahepatic bile duct: Secondary | ICD-10-CM

## 2018-03-27 DIAGNOSIS — K219 Gastro-esophageal reflux disease without esophagitis: Secondary | ICD-10-CM | POA: Diagnosis present

## 2018-03-27 DIAGNOSIS — R6520 Severe sepsis without septic shock: Secondary | ICD-10-CM | POA: Diagnosis present

## 2018-03-27 DIAGNOSIS — Z9851 Tubal ligation status: Secondary | ICD-10-CM | POA: Diagnosis not present

## 2018-03-27 DIAGNOSIS — Z881 Allergy status to other antibiotic agents status: Secondary | ICD-10-CM

## 2018-03-27 DIAGNOSIS — C20 Malignant neoplasm of rectum: Secondary | ICD-10-CM

## 2018-03-27 DIAGNOSIS — Z85850 Personal history of malignant neoplasm of thyroid: Secondary | ICD-10-CM

## 2018-03-27 DIAGNOSIS — R911 Solitary pulmonary nodule: Secondary | ICD-10-CM

## 2018-03-27 DIAGNOSIS — K921 Melena: Secondary | ICD-10-CM | POA: Diagnosis not present

## 2018-03-27 DIAGNOSIS — Z808 Family history of malignant neoplasm of other organs or systems: Secondary | ICD-10-CM

## 2018-03-27 DIAGNOSIS — T451X5A Adverse effect of antineoplastic and immunosuppressive drugs, initial encounter: Secondary | ICD-10-CM | POA: Diagnosis present

## 2018-03-27 DIAGNOSIS — F419 Anxiety disorder, unspecified: Secondary | ICD-10-CM | POA: Diagnosis present

## 2018-03-27 DIAGNOSIS — R739 Hyperglycemia, unspecified: Secondary | ICD-10-CM | POA: Diagnosis not present

## 2018-03-27 DIAGNOSIS — Z888 Allergy status to other drugs, medicaments and biological substances status: Secondary | ICD-10-CM

## 2018-03-27 DIAGNOSIS — A419 Sepsis, unspecified organism: Secondary | ICD-10-CM | POA: Diagnosis not present

## 2018-03-27 DIAGNOSIS — Z79899 Other long term (current) drug therapy: Secondary | ICD-10-CM

## 2018-03-27 DIAGNOSIS — R200 Anesthesia of skin: Secondary | ICD-10-CM | POA: Diagnosis not present

## 2018-03-27 DIAGNOSIS — E785 Hyperlipidemia, unspecified: Secondary | ICD-10-CM | POA: Diagnosis not present

## 2018-03-27 DIAGNOSIS — R634 Abnormal weight loss: Secondary | ICD-10-CM | POA: Diagnosis not present

## 2018-03-27 DIAGNOSIS — R110 Nausea: Secondary | ICD-10-CM | POA: Diagnosis not present

## 2018-03-27 DIAGNOSIS — Z87891 Personal history of nicotine dependence: Secondary | ICD-10-CM | POA: Diagnosis not present

## 2018-03-27 DIAGNOSIS — T380X5A Adverse effect of glucocorticoids and synthetic analogues, initial encounter: Secondary | ICD-10-CM | POA: Diagnosis present

## 2018-03-27 DIAGNOSIS — Z91018 Allergy to other foods: Secondary | ICD-10-CM

## 2018-03-27 DIAGNOSIS — K5900 Constipation, unspecified: Secondary | ICD-10-CM

## 2018-03-27 DIAGNOSIS — R9431 Abnormal electrocardiogram [ECG] [EKG]: Secondary | ICD-10-CM | POA: Diagnosis present

## 2018-03-27 DIAGNOSIS — E890 Postprocedural hypothyroidism: Secondary | ICD-10-CM | POA: Diagnosis present

## 2018-03-27 DIAGNOSIS — E876 Hypokalemia: Secondary | ICD-10-CM | POA: Diagnosis present

## 2018-03-27 DIAGNOSIS — E872 Acidosis, unspecified: Secondary | ICD-10-CM | POA: Diagnosis present

## 2018-03-27 DIAGNOSIS — Z8042 Family history of malignant neoplasm of prostate: Secondary | ICD-10-CM | POA: Diagnosis not present

## 2018-03-27 DIAGNOSIS — R509 Fever, unspecified: Secondary | ICD-10-CM | POA: Diagnosis not present

## 2018-03-27 DIAGNOSIS — Z8249 Family history of ischemic heart disease and other diseases of the circulatory system: Secondary | ICD-10-CM | POA: Diagnosis not present

## 2018-03-27 DIAGNOSIS — Z801 Family history of malignant neoplasm of trachea, bronchus and lung: Secondary | ICD-10-CM

## 2018-03-27 DIAGNOSIS — I2510 Atherosclerotic heart disease of native coronary artery without angina pectoris: Secondary | ICD-10-CM | POA: Diagnosis present

## 2018-03-27 DIAGNOSIS — J181 Lobar pneumonia, unspecified organism: Secondary | ICD-10-CM | POA: Diagnosis present

## 2018-03-27 DIAGNOSIS — R000 Tachycardia, unspecified: Secondary | ICD-10-CM | POA: Diagnosis not present

## 2018-03-27 HISTORY — DX: Malignant neoplasm of rectum: C20

## 2018-03-27 HISTORY — DX: Gilbert syndrome: E80.4

## 2018-03-27 HISTORY — DX: Hyperlipidemia, unspecified: E78.5

## 2018-03-27 LAB — I-STAT CG4 LACTIC ACID, ED
Lactic Acid, Venous: 2.54 mmol/L (ref 0.5–1.9)
Lactic Acid, Venous: 2.8 mmol/L (ref 0.5–1.9)

## 2018-03-27 LAB — COMPREHENSIVE METABOLIC PANEL
ALBUMIN: 4.1 g/dL (ref 3.5–5.0)
ALT: 29 U/L (ref 0–44)
ALT: 48 U/L — ABNORMAL HIGH (ref 0–44)
AST: 32 U/L (ref 15–41)
AST: 66 U/L — AB (ref 15–41)
Albumin: 4.3 g/dL (ref 3.5–5.0)
Alkaline Phosphatase: 55 U/L (ref 38–126)
Alkaline Phosphatase: 65 U/L (ref 38–126)
Anion gap: 5 (ref 5–15)
Anion gap: 9 (ref 5–15)
BILIRUBIN TOTAL: 2.6 mg/dL — AB (ref 0.3–1.2)
BUN: 9 mg/dL (ref 6–20)
BUN: 10 mg/dL (ref 6–20)
CALCIUM: 9 mg/dL (ref 8.9–10.3)
CO2: 26 mmol/L (ref 22–32)
CO2: 21 mmol/L — AB (ref 22–32)
Calcium: 8.8 mg/dL — ABNORMAL LOW (ref 8.9–10.3)
Chloride: 109 mmol/L (ref 98–111)
Chloride: 108 mmol/L (ref 98–111)
Creatinine, Ser: 0.92 mg/dL (ref 0.44–1.00)
Creatinine, Ser: 0.81 mg/dL (ref 0.44–1.00)
GFR calc Af Amer: >60 mL/min (ref >60)
GFR calc Af Amer: >60 mL/min (ref >60)
GFR calc non Af Amer: >60 mL/min (ref >60)
GFR calc non Af Amer: >60 mL/min (ref >60)
GLUCOSE: 107 mg/dL — AB (ref 70–99)
Glucose, Bld: 174 mg/dL — ABNORMAL HIGH (ref 70–99)
POTASSIUM: 3.1 mmol/L — AB (ref 3.5–5.1)
Potassium: 2.9 mmol/L — ABNORMAL LOW (ref 3.5–5.1)
Sodium: 140 mmol/L (ref 135–145)
Sodium: 138 mmol/L (ref 135–145)
Total Bilirubin: 2.6 mg/dL — ABNORMAL HIGH (ref 0.3–1.2)
Total Protein: 6.9 g/dL (ref 6.5–8.1)
Total Protein: 7.1 g/dL (ref 6.5–8.1)

## 2018-03-27 LAB — CBC WITH DIFFERENTIAL/PLATELET
Abs Immature Granulocytes: 0.01 10*3/uL (ref 0.00–0.07)
Abs Immature Granulocytes: 0.01 10*3/uL (ref 0.00–0.07)
Basophils Absolute: 0 10*3/uL (ref 0.0–0.1)
Basophils Absolute: 0 10*3/uL (ref 0.0–0.1)
Basophils Relative: 0 %
Basophils Relative: 0 %
EOS PCT: 0 %
Eosinophils Absolute: 0 10*3/uL (ref 0.0–0.5)
Eosinophils Absolute: 0 10*3/uL (ref 0.0–0.5)
Eosinophils Relative: 1 %
HCT: 36.6 % (ref 36.0–46.0)
HCT: 34.4 % — ABNORMAL LOW (ref 36.0–46.0)
Hemoglobin: 12.3 g/dL (ref 12.0–15.0)
Hemoglobin: 11.5 g/dL — ABNORMAL LOW (ref 12.0–15.0)
Immature Granulocytes: 0 %
Immature Granulocytes: 0 %
Lymphocytes Relative: 38 %
Lymphocytes Relative: 2 %
Lymphs Abs: 1.2 10*3/uL (ref 0.7–4.0)
Lymphs Abs: 0.1 10*3/uL — ABNORMAL LOW (ref 0.7–4.0)
MCH: 31.9 pg (ref 26.0–34.0)
MCH: 31.8 pg (ref 26.0–34.0)
MCHC: 33.6 g/dL (ref 30.0–36.0)
MCHC: 33.4 g/dL (ref 30.0–36.0)
MCV: 95.1 fL (ref 80.0–100.0)
MCV: 95 fL (ref 80.0–100.0)
Monocytes Absolute: 0.3 10*3/uL (ref 0.1–1.0)
Monocytes Absolute: 0.1 10*3/uL (ref 0.1–1.0)
Monocytes Relative: 9 %
Monocytes Relative: 4 %
NRBC: 0 % (ref 0.0–0.2)
Neutro Abs: 1.7 10*3/uL (ref 1.7–7.7)
Neutro Abs: 3.5 10*3/uL (ref 1.7–7.7)
Neutrophils Relative %: 52 %
Neutrophils Relative %: 94 %
Platelets: 125 10*3/uL — ABNORMAL LOW (ref 150–400)
Platelets: 84 10*3/uL — ABNORMAL LOW (ref 150–400)
RBC: 3.85 MIL/uL — ABNORMAL LOW (ref 3.87–5.11)
RBC: 3.62 MIL/uL — ABNORMAL LOW (ref 3.87–5.11)
RDW: 13.8 % (ref 11.5–15.5)
RDW: 14 % (ref 11.5–15.5)
WBC: 3.1 10*3/uL — ABNORMAL LOW (ref 4.0–10.5)
WBC: 3.7 10*3/uL — ABNORMAL LOW (ref 4.0–10.5)
nRBC: 0 % (ref 0.0–0.2)

## 2018-03-27 LAB — INFLUENZA PANEL BY PCR (TYPE A & B)
Influenza A By PCR: NEGATIVE
Influenza B By PCR: NEGATIVE

## 2018-03-27 LAB — PROTIME-INR
INR: 1
Prothrombin Time: 13.1 seconds (ref 11.4–15.2)

## 2018-03-27 LAB — I-STAT BETA HCG BLOOD, ED (MC, WL, AP ONLY): I-stat hCG, quantitative: 9.8 m[IU]/mL — ABNORMAL HIGH (ref <5)

## 2018-03-27 MED ORDER — POTASSIUM CHLORIDE CRYS ER 20 MEQ PO TBCR
40.0000 meq | EXTENDED_RELEASE_TABLET | Freq: Once | ORAL | Status: AC
  Administered 2018-03-27: 40 meq via ORAL
  Filled 2018-03-27: qty 2

## 2018-03-27 MED ORDER — POTASSIUM CHLORIDE IN NACL 40-0.9 MEQ/L-% IV SOLN: INTRAVENOUS | Status: DC

## 2018-03-27 MED ORDER — DIPHENHYDRAMINE HCL 50 MG/ML IJ SOLN
50.0000 mg | Freq: Once | INTRAMUSCULAR | Status: AC
  Administered 2018-03-27: 50 mg via INTRAVENOUS
  Filled 2018-03-27: qty 1

## 2018-03-27 MED ORDER — PALONOSETRON HCL INJECTION 0.25 MG/5ML
0.2500 mg | Freq: Once | INTRAVENOUS | Status: AC
  Administered 2018-03-27: 0.25 mg via INTRAVENOUS

## 2018-03-27 MED ORDER — SODIUM CHLORIDE 0.9 % IV SOLN
10.0000 mg | Freq: Once | INTRAVENOUS | Status: AC
  Administered 2018-03-27: 10 mg via INTRAVENOUS
  Filled 2018-03-27: qty 1

## 2018-03-27 MED ORDER — ONDANSETRON HCL 4 MG/2ML IJ SOLN
4.0000 mg | Freq: Once | INTRAMUSCULAR | Status: AC
  Administered 2018-03-27: 4 mg via INTRAVENOUS
  Filled 2018-03-27: qty 2

## 2018-03-27 MED ORDER — OXALIPLATIN CHEMO INJECTION 100 MG/20ML
85.0000 mg/m2 | Freq: Once | INTRAVENOUS | Status: AC
  Administered 2018-03-27: 165 mg via INTRAVENOUS
  Filled 2018-03-27: qty 33

## 2018-03-27 MED ORDER — LEUCOVORIN CALCIUM INJECTION 350 MG
800.0000 mg | Freq: Once | INTRAVENOUS | Status: AC
  Administered 2018-03-27: 800 mg via INTRAVENOUS
  Filled 2018-03-27: qty 35

## 2018-03-27 MED ORDER — POTASSIUM CHLORIDE 10 MEQ/100ML IV SOLN
10.0000 meq | INTRAVENOUS | Status: DC
  Administered 2018-03-28: 10 meq via INTRAVENOUS
  Filled 2018-03-27 (×2): qty 100

## 2018-03-27 MED ORDER — PROCHLORPERAZINE MALEATE 10 MG PO TABS: 10.0000 mg | ORAL_TABLET | Freq: Four times a day (QID) | ORAL | 3 refills | Status: DC | PRN

## 2018-03-27 MED ORDER — SODIUM CHLORIDE 0.9 % IV BOLUS (SEPSIS)
1000.0000 mL | Freq: Once | INTRAVENOUS | Status: AC
  Administered 2018-03-27: 1000 mL via INTRAVENOUS

## 2018-03-27 MED ORDER — METRONIDAZOLE IN NACL 5-0.79 MG/ML-% IV SOLN: 500.0000 mg | Freq: Three times a day (TID) | INTRAVENOUS | Status: DC

## 2018-03-27 MED ORDER — METRONIDAZOLE IN NACL 5-0.79 MG/ML-% IV SOLN
500.0000 mg | Freq: Three times a day (TID) | INTRAVENOUS | Status: DC
  Administered 2018-03-27 – 2018-03-29 (×6): 500 mg via INTRAVENOUS
  Filled 2018-03-27 (×6): qty 100

## 2018-03-27 MED ORDER — PALONOSETRON HCL INJECTION 0.25 MG/5ML
INTRAVENOUS | Status: AC
  Filled 2018-03-27: qty 5

## 2018-03-27 MED ORDER — ONDANSETRON HCL 4 MG/2ML IJ SOLN
4.0000 mg | Freq: Four times a day (QID) | INTRAMUSCULAR | Status: DC | PRN
  Administered 2018-03-28 (×2): 4 mg via INTRAVENOUS
  Filled 2018-03-27 (×2): qty 2

## 2018-03-27 MED ORDER — ACETAMINOPHEN 325 MG PO TABS
650.0000 mg | ORAL_TABLET | Freq: Once | ORAL | Status: AC
  Administered 2018-03-27: 650 mg via ORAL
  Filled 2018-03-27: qty 2

## 2018-03-27 MED ORDER — ACETAMINOPHEN 500 MG PO TABS: 1000.0000 mg | ORAL_TABLET | ORAL | Status: DC

## 2018-03-27 MED ORDER — VANCOMYCIN HCL IN DEXTROSE 1-5 GM/200ML-% IV SOLN: 1000.0000 mg | Freq: Once | INTRAVENOUS | Status: DC

## 2018-03-27 MED ORDER — SODIUM CHLORIDE 0.9 % IV BOLUS (SEPSIS)
500.0000 mL | Freq: Once | INTRAVENOUS | Status: AC
  Administered 2018-03-27: 500 mL via INTRAVENOUS

## 2018-03-27 MED ORDER — VANCOMYCIN HCL 10 G IV SOLR
1500.0000 mg | Freq: Once | INTRAVENOUS | Status: AC
  Administered 2018-03-28: 1500 mg via INTRAVENOUS
  Filled 2018-03-27 (×2): qty 1500

## 2018-03-27 MED ORDER — CLINDAMYCIN PHOSPHATE 900 MG/50ML IV SOLN: 900.0000 mg | INTRAVENOUS | Status: DC

## 2018-03-27 MED ORDER — POTASSIUM CHLORIDE IN NACL 40-0.9 MEQ/L-% IV SOLN
INTRAVENOUS | Status: DC
  Administered 2018-03-28: 125 mL/h via INTRAVENOUS
  Filled 2018-03-27 (×3): qty 1000

## 2018-03-27 MED ORDER — DEXTROSE 5 % IV SOLN
Freq: Once | INTRAVENOUS | Status: AC
  Administered 2018-03-27: 11:00:00 via INTRAVENOUS

## 2018-03-27 MED ORDER — SODIUM CHLORIDE 0.9% FLUSH
10.0000 mL | INTRAVENOUS | Status: DC | PRN
  Administered 2018-03-27: 10 mL
  Filled 2018-03-27: qty 10

## 2018-03-27 MED ORDER — SODIUM CHLORIDE 0.9 % IV SOLN
2400.0000 mg/m2 | INTRAVENOUS | Status: DC
  Administered 2018-03-27: 4650 mg via INTRAVENOUS
  Filled 2018-03-27: qty 93

## 2018-03-27 MED ORDER — ONDANSETRON HCL 4 MG PO TABS: 4.0000 mg | ORAL_TABLET | Freq: Four times a day (QID) | ORAL | Status: DC | PRN

## 2018-03-27 MED ORDER — SODIUM CHLORIDE 0.9 % IV SOLN
2.0000 g | Freq: Once | INTRAVENOUS | Status: AC
  Administered 2018-03-27: 2 g via INTRAVENOUS
  Filled 2018-03-27: qty 2

## 2018-03-27 MED ORDER — DEXTROSE 5 % IV SOLN
INTRAVENOUS | Status: DC
  Administered 2018-03-27: 11:00:00 via INTRAVENOUS

## 2018-03-27 NOTE — Patient Instructions (Signed)
[facility] at [facility] Discharge Instructions     Thank you for choosing [facility] at [facility] to provide your oncology and hematology care.  To afford each patient quality time with our provider, please arrive at least 15 minutes before your scheduled appointment time.   If you have a lab appointment with the Cancer Center please come in thru the  Main Entrance and check in at the main information desk  You need to re-schedule your appointment should you arrive 10 or more minutes late.  We strive to give you quality time with our providers, and arriving late affects you and other patients whose appointments are after yours.  Also, if you no show three or more times for appointments you may be dismissed from the clinic at the providers discretion.     Again, thank you for choosing [facility].  Our hope is that these requests will decrease the amount of time that you wait before being seen by our physicians.       _____________________________________________________________  Should you have questions after your visit to  Cancer Center, please contact our office at [phone] between the hours of 8:00 a.m. and 4:30 p.m.  Voicemails left after 4:00 p.m. will not be returned until the following business day.  For prescription refill requests, have your pharmacy contact our office and allow 72 hours.    Cancer Center Support Programs:   > Cancer Support Group  2nd Tuesday of the month 1pm-2pm, Journey Room    

## 2018-03-27 NOTE — Patient Instructions (Signed)
[facility] Discharge Instructions for Patients Receiving Chemotherapy   Beginning January 23rd 2017 lab work for the [facility] will be done in the  Main lab at [facility] on 1st floor. If you have a lab appointment with the [facility] please come in thru the  Main Entrance and check in at the main information desk   Today you received the following chemotherapy agents leucovorin, oxaliplatin, and 5FU.  To help prevent nausea and vomiting after your treatment, we encourage you to take your nausea medication   If you develop nausea and vomiting, or diarrhea that is not controlled by your medication, call the clinic.  The clinic phone number is [phone]. Office hours are Monday-Friday 8:30am-5:00pm.  BELOW ARE SYMPTOMS THAT SHOULD BE REPORTED IMMEDIATELY:  *FEVER GREATER THAN 101.0 F  *CHILLS WITH OR WITHOUT FEVER  NAUSEA AND VOMITING THAT IS NOT CONTROLLED WITH YOUR NAUSEA MEDICATION  *UNUSUAL SHORTNESS OF BREATH  *UNUSUAL BRUISING OR BLEEDING  TENDERNESS IN MOUTH AND THROAT WITH OR WITHOUT PRESENCE OF ULCERS  *URINARY PROBLEMS  *BOWEL PROBLEMS  UNUSUAL RASH Items with * indicate a potential emergency and should be followed up as soon as possible. If you have an emergency after office hours please contact your primary care physician or go to the nearest emergency department.  Please call the clinic during office hours if you have any questions or concerns.   You may also contact the Patient Navigator at [phone] should you have any questions or need assistance in obtaining follow up care.      Resources For Cancer Patients and their Caregivers ? American Cancer Society: Can assist with transportation, wigs, general needs, runs Look Good Feel Better.        343-019-1146 ? Cancer Care: Provides financial assistance, online support groups, medication/co-pay assistance.  1-800-813-HOPE (787) 679-1335) ? [place] [facility]ists [place] cancer patients and their families through emotional , educational and financial support.  330 750 6607 ? [place] DSS Where to apply for food stamps, Medicaid and utility assistance. 7197766080 ? [facility]: Transportation to medical appointments. 4787247861 ? Social Security Administration: May apply for disability if have a Stage IV cancer. 657-876-1383 272-498-9525 ? [employer], Disability and Transit Services: Assists with nutrition, care and transit needs. (724)170-1519

## 2018-03-27 NOTE — ED Provider Notes (Signed)
[name] EMERGENCY DEPARTMENT Provider Note   CSN: [id number] Arrival date & time: 03/27/18  2024     History   Chief Complaint Chief Complaint  Patient presents with  . Fever    HPI Samantha Reynolds is a 52 y.o. female.  HPI   52 year old female with recent diagnosis of rectal cancer, last chemo today Zentz with shaking chills and fever she denies headache, neck pain, cough, chest pain, dyspnea, change in rashes, or wounds.  She states she has had some nausea and had one episode of diarrhea.  She has had her flu shot had and has had no definite sick contacts.  She was diagnosed with rectal carcinoma the end of November after an episode of rectal bleeding.  She had a flex sigmoid performed.  She is undergoing chemotherapy.  She has known mets to the liver.  She had a port placed in her left anterior chest in mid December.  She states that there was some soreness at the time it was placed but since then it has healed well and she has not noticed any discharge, tenderness, or redness at the site.  She has had some increased frequency of urination but no pain today. Past Medical History:  Diagnosis Date  . Anxiety   . Cancer [facility]) 2013   thyroid  . Endometrial polyp   . Endometrial thickening on ultra sound   . GERD (gastroesophageal reflux disease)   . H/O Hashimoto thyroiditis   . 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 Hypothyroidism     Patient Active Problem List   Diagnosis Date Noted  . 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[place]) 02/26/2018  . Hematochezia 01/24/2018  . 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 Hyperlipidemia 11/09/2015  . Sciatica of right side 06/26/2012  . History of thyroid cancer 04/02/2012  . 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 Pain in joint, ankle and foot 03/09/2011    Past Surgical History:  Procedure Laterality Date  . BIOPSY  02/19/2018   Procedure: BIOPSY;  Surgeon: Rogene Houston, MD;  [facility];  Service: Endoscopy;;  rectum  . COLONOSCOPY N/A 08/20/2015   Procedure: COLONOSCOPY;  Surgeon: Rogene Houston, MD;  [facility];  Service: Endoscopy;  Laterality: N/A;  730  . [place] ENDOMETRIAL ABLATION  08-13-2004  . FLEXIBLE SIGMOIDOSCOPY N/A 02/19/2018   Procedure: FLEXIBLE SIGMOIDOSCOPY;  Surgeon: Rogene Houston, MD;  [facility];  Service: Endoscopy;  Laterality: N/A;  . HYSTEROSCOPY W/D&C N/A 04/30/2015   Procedure: DILATATION AND CURETTAGE / INTENDED HYSTEROSCOPY;  Surgeon: Dian Queen, MD;  Location: [place];  Service: Gynecology;  Laterality: N/A;  . IR US GUIDE BX ASP/DRAIN  03/09/2018  . LAPAROSCOPIC CHOLECYSTECTOMY  04/1996  . POLYPECTOMY  08/20/2015   Procedure: POLYPECTOMY;  Surgeon: Rogene Houston, MD;  [facility];  Service: Endoscopy;;  Splenic flexure polypectomy  . POLYPECTOMY  02/19/2018   Procedure: POLYPECTOMY;  Surgeon: Rogene Houston, MD;  [facility];  Service: Endoscopy;;  rectum  . PORTACATH PLACEMENT N/A 03/14/2018   Procedure: INSERTION PORT-A-CATH;  Surgeon: Aviva Signs, MD;  Location: [facility];  Service: General;  Laterality: N/A;  . REDUCTION INCARCERATED UTERUS  06-20-2000   intrauterine preg. 13 wks /  urinary retention  . THYROID LOBECTOMY  11/24/2011   Procedure: THYROID LOBECTOMY;  Surgeon: Earnstine Regal, MD;  Location: [facility];  Service: General;  Laterality: Left;  Left Thyroid Lobectomy  . TUBAL LIGATION  2002     OB History   No obstetric history on file.      Home Medications    Prior to Admission medications   Medication Sig Start Date End Date Taking? Authorizing Provider  ALPRAZolam Duanne Moron) 0.5 MG tablet Take 1 tablet (0.5 mg total) by mouth 2 (two) times daily as needed for anxiety. 02/21/18   [name], MD  dexamethasone (DECADRON) 4 MG tablet Take 1  tablet (4 mg total) by mouth daily. For 2 days after chemotherapy 03/16/18   Glennie Isle, NP-C  fluorouracil CALGB 35361 in sodium chloride 0.9 % 150 mL Inject into the vein over 96 hr.    [provider]  fluticasone (CUTIVATE) 0.05 % cream Apply 1 application topically 2 (two) times daily as needed. Skin irritation/rash 02/07/18   [provider]  HYDROcodone-acetaminophen (NORCO) 5-325 MG tablet Take 1 tablet by mouth every 4 (four) hours as needed for moderate pain. Patient not taking: Reported on 03/27/2018 03/14/18   Aviva Signs, MD  ketotifen (ALAWAY) 0.025 % ophthalmic solution Place 1 drop into both eyes daily as needed (for allergy eyes.).    [provider]  leucovorin in dextrose 5 % 250 mL Inject into the vein once.    [provider]  lidocaine-prilocaine (EMLA) cream Apply small amount to port site one hour prior to appointment and cover with plastic wrap. 03/13/18   Derek Jack, MD  OXALIPLATIN IV Inject into the vein.    [provider]  prochlorperazine (COMPAZINE) 10 MG tablet Take 1 tablet (10 mg total) by mouth every 6 (six) hours as needed (NAUSEA). 03/13/18   Derek Jack, MD  prochlorperazine (COMPAZINE) 10 MG tablet Take 1 tablet (10 mg total) by mouth every 6 (six) hours as needed for nausea or vomiting. 03/27/18   Lockamy, Randi L, NP-C  progesterone (PROMETRIUM) 100 MG capsule Take 100 mg by mouth at bedtime.    [provider]  thyroid (ARMOUR) 90 MG tablet Take 90 mg by mouth daily before breakfast.     [provider]    Family History Family History  Problem Relation Age of Onset  . Hypertension Mother   . Hypertension Father   . Prostate cancer Father   . Cancer Maternal Aunt        spine/back  . Cancer Paternal Grandfather        lung  . Healthy Son   . Healthy Son   . Healthy Son   . Healthy Son   . Colon cancer Neg Hx     Social History Social History   Tobacco  Use  . Smoking status: Former Smoker    Packs/day: 0.25    Years: 10.00    Pack years: 2.50    Types: Cigarettes    Last attempt to quit: 10/31/1991    Years since quitting: 26.4  . Smokeless tobacco: Never Used  Substance Use Topics  . Alcohol use: No  . Drug use: No     Allergies   Demerol [meperidine]; Penicillins; and Other   Review of Systems Review of Systems  All other systems reviewed and are negative.    Physical Exam Updated Vital Signs BP 103/70 (BP Location: Left Arm)   Pulse (!) 124   Temp (!) 102.8 F (39.3 C) (Oral)   Resp 20   Ht  1.702 m (5\' 7" )   Wt 77.6 kg   LMP 02/16/2015 (Approximate)   SpO2 98%   BMI 26.79 kg/m   Physical Exam Vitals signs and nursing note reviewed. Exam conducted with a chaperone present.  Constitutional:      Appearance: Normal appearance.  HENT:     Head: Normocephalic and atraumatic.     Right Ear: Tympanic membrane and external ear normal.     Left Ear: Tympanic membrane and external ear normal.     Nose: Nose normal.     Mouth/Throat:     Mouth: Mucous membranes are moist.     Pharynx: Oropharynx is clear.  Eyes:     Pupils: Pupils are equal, round, and reactive to light.  Neck:     Musculoskeletal: Normal range of motion.  Cardiovascular:     Rate and Rhythm: Tachycardia present.     Pulses: Normal pulses.  Pulmonary:     Effort: Pulmonary effort is normal.     Breath sounds: Normal breath sounds.     Comments: Chest wall with left port in place no tenderness, erythema, or induration Abdominal:     General: Abdomen is flat.     Palpations: Abdomen is soft.  Musculoskeletal: Normal range of motion.        General: No swelling or tenderness.  Skin:    General: Skin is warm and dry.     Capillary Refill: Capillary refill takes less than 2 seconds.  Neurological:     General: No focal deficit present.     Mental Status: She is alert and oriented to person, place, and time.  Psychiatric:        Mood and  Affect: Mood normal.        Behavior: Behavior normal.      ED Treatments / Results  Labs (all labs ordered are listed, but only abnormal results are displayed) Labs Reviewed  CULTURE, BLOOD (ROUTINE X 2)  CULTURE, BLOOD (ROUTINE X 2)  COMPREHENSIVE METABOLIC PANEL  CBC WITH DIFFERENTIAL/PLATELET  PROTIME-INR  URINALYSIS, ROUTINE W REFLEX MICROSCOPIC  I-STAT CG4 LACTIC ACID, ED  I-STAT BETA HCG BLOOD, ED ([facility], [facility], [facility] ONLY)    EKG None  Radiology No results found.  Procedures .Critical Care Performed by: Pattricia Boss, MD Authorized by: Pattricia Boss, MD   Critical care provider statement:    Critical care time (minutes):  45   Critical care end time:  03/27/2018 10:40 PM   Critical care was necessary to treat or prevent imminent or life-threatening deterioration of the following conditions:  Sepsis   Critical care was time spent personally by me on the following activities:  Discussions with consultants, evaluation of patient's response to treatment, examination of patient, ordering and performing treatments and interventions, ordering and review of laboratory studies, ordering and review of radiographic studies, pulse oximetry, re-evaluation of patient's condition, obtaining history from patient or surrogate and review of old charts   (including critical care time)  Medications Ordered in ED Medications - No data to display   Initial Impression / Assessment and Plan / ED Course  I have reviewed the triage vital signs and the nursing notes.  Pertinent labs & imaging results that were available during my care of the patient were reviewed by me and considered in my medical decision making (see chart for details).     Sepsis - Repeat Assessment  Performed at:    10:40 PM   Vitals     Blood pressure 103/70, pulse Marland Kitchen)  124, temperature (!) 102.8 F (39.3 C), temperature source Oral, resp. rate 20, height 1.702 m (5\' 7" ), weight 77.6 kg, last menstrual period  02/16/2015, SpO2 98 %.  Heart:     Tachycardic  Lungs:    CTA  Capillary Refill:   <2 sec  Peripheral Pulse:   Radial pulse palpable  Skin:     Normal Color    52 year old female with rectal cancer and liver mets presents today after recent chemotherapeutics  with fever, chills, tachycardia, elevated lactic acid.  Blood cultures obtained.  No definitive source seen on exam or initial work-up.  Urinalysis is pending.  Patient is receiving broad-spectrum antibiotics.  Historically, she reports rash with penicillin.  She received cefepime here in the emergency department without any reaction.  She also reports that she had some redness after receiving vancomycin peri-surgically for her port placement.  She has received Benadryl here and we will observe while she is receiving Vancomycin Heart rate has decreased from 1 35-1 22.  Blood pressure has remained stable with systolic at approximately 105.  This is reported baseline for her.   Currently she is received 1 L of fluid and remaining 2 L are to be infused.  Plan admission to hospitalist service for ongoing evaluation and treatment of sepsis Discussed discussed discussed with Dr. Olevia Bowens and he will see for admission to stepdown Final Clinical Impressions(s) / ED Diagnoses   Final diagnoses:  Sepsis, due to unspecified organism, unspecified whether acute organ dysfunction present [facility])    ED Discharge Orders    None       Pattricia Boss, MD 03/27/18 2243

## 2018-03-27 NOTE — ED Notes (Signed)
Date and time results received: 03/27/18 2125 (use smartphrase ".now" to insert current time)  Test: lactic acid Critical Value: 2.8  Name of Provider Notified: Dr. Jeanell Sparrow at 2125  Orders Received? Or Actions Taken?: no/na

## 2018-03-27 NOTE — ED Triage Notes (Signed)
Pt started having nausea and chills tonight around 6pm; pt states she finished her treatment around 2pm today; pt took a compazine at home with some relief; pt states her temp at 100.5

## 2018-03-27 NOTE — H&P (Signed)
History and Physical    Samantha Reynolds OMV:[id number] DOB: Feb 24, 1966 DOA: 03/27/2018  PCP: [name], MD   Patient coming from: Home.  I have personally briefly reviewed patient's old medical records in [place]  Chief Complaint: Nausea and chills.  HPI: Samantha Reynolds is a 52 y.o. female with medical history significant of anxiety, thyroid cancer, thyroid lobectomy, Hashimoto's thyroiditis, hypothyroidism, GERD, endometrial polyp and thickening on ultrasound, Gilbert syndrome, hyperlipidemia, rectal carcinoma who underwent chemotherapy earlier today with FOLFOX.  She is also on 5-FU home infusion pump.  She was discharged from the cancer center after the FOLFOX infusion around 1400.  Around 1800 the patient started feeling nauseous and having chills.  She denies sick contacts to her best of her knowledge or travel history.  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  Occasional nonspecific headache, but no sore throat, rhinorrhea or productive cough.  No chest pain, dizziness, palpitations, diaphoresis, PND orthopnea or pitting edema of the lower extremities.  Positive nausea, one episode of mildly loose stools, mild abdominal discomfort, flatulence and bloating sensation, but no emesis, constipation, melena or hematochezia.  She denies dysuria, frequency or hematuria.  ED Course: Initial vital signs temperature 102.8 F, pulse 124, respirations 20, blood pressure 103/70 mmHg and O2 sat 98% on room air.  The patient received 2500 normal saline bolus, Zofran 4 mg IVP x1, K-Lor 40 mEq p.o. x1, acetaminophen 650 mg p.o. x1 dose, cefepime, metronidazole and vancomycin.  She was premedicated with diphenhydramine 50 mg IVP prior to vancomycin infusion.  I spoke to Dr. Delton Coombes about the patient's condition and will be holding her 5-FU infusion pump for now.  Blood cultures x2 were drawn.  Her urinalysis was normal.  CBC showed a white count of 3.7 with 94% neutrophils,  2% lymphocytes and 4% monocytes.  Hemoglobin was 11.5 g/dL and platelets of 84.  Lactic acid was 2.8 then 2.54 mmol/L.  PT and INR were normal.  CMP shows a potassium of 2.9 and CO2 21 mmol/L.  All other electrolytes anion gap are within normal limits.  Glucose 174 and bilirubin 2.6 mg/dL.  AST 66 and ALT 48 units/L.  All other values are within normal limits.  Influenza a and B by PCR was negative.  Her chest radiograph did not show any acute cardiopulmonary pathology.  Review of Systems: As per HPI otherwise 10 point review of systems negative  Past Medical History:  Diagnosis Date  . Anxiety   . Cancer [facility]) 2013   thyroid  . Endometrial polyp   . Endometrial thickening on ultra sound   . GERD (gastroesophageal reflux disease)   . H/O Hashimoto thyroiditis   . 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 Hypothyroidism     Past Surgical History:  Procedure Laterality Date  . BIOPSY  02/19/2018   Procedure: BIOPSY;  Surgeon: Rogene Houston, MD;  [facility];  Service: Endoscopy;;  rectum  . COLONOSCOPY N/A 08/20/2015   Procedure: COLONOSCOPY;  Surgeon: Rogene Houston, MD;  [facility];  Service: Endoscopy;  Laterality: N/A;  730  . [place] ENDOMETRIAL ABLATION  08-13-2004  . FLEXIBLE SIGMOIDOSCOPY N/A 02/19/2018   Procedure: FLEXIBLE SIGMOIDOSCOPY;  Surgeon: Rogene Houston, MD;  [facility];  Service: Endoscopy;  Laterality: N/A;  . HYSTEROSCOPY W/D&C N/A 04/30/2015  Procedure: DILATATION AND CURETTAGE / INTENDED HYSTEROSCOPY;  Surgeon: Dian Queen, MD;  Location: [place];  Service: Gynecology;  Laterality: N/A;  . IR US GUIDE BX ASP/DRAIN  03/09/2018  . LAPAROSCOPIC CHOLECYSTECTOMY  04/1996  . POLYPECTOMY  08/20/2015   Procedure: POLYPECTOMY;  Surgeon: Rogene Houston, MD;  [facility];   Service: Endoscopy;;  Splenic flexure polypectomy  . POLYPECTOMY  02/19/2018   Procedure: POLYPECTOMY;  Surgeon: Rogene Houston, MD;  [facility];  Service: Endoscopy;;  rectum  . PORTACATH PLACEMENT N/A 03/14/2018   Procedure: INSERTION PORT-A-CATH;  Surgeon: Aviva Signs, MD;  Location: [facility];  Service: General;  Laterality: N/A;  . REDUCTION INCARCERATED UTERUS  06-20-2000   intrauterine preg. 13 wks /  urinary retention  . THYROID LOBECTOMY  11/24/2011   Procedure: THYROID LOBECTOMY;  Surgeon: Earnstine Regal, MD;  Location: [facility];  Service: General;  Laterality: Left;  Left Thyroid Lobectomy  . TUBAL LIGATION  2002     reports that she quit smoking about 26 years ago. Her smoking use included cigarettes. She has a 2.50 pack-year smoking history. She has never used smokeless tobacco. She reports that she does not drink alcohol or use drugs.  Allergies  Allergen Reactions  . Demerol [Meperidine] Itching    All over the body.  . Penicillins Hives     childhood, does not remember if it spread all over the body or not. Has patient had a PCN reaction causing immediate rash, facial/tongue/throat swelling, SOB or lightheadedness with hypotension: unknown Has patient had a PCN reaction causing severe rash involving mucus membranes or skin necrosis: unknown Has patient had a PCN reaction that required hospitalization unknown Has patient had a PCN reaction occurring within the last 10 years:   . Other Hives and Other (See Comments)    COCONUT   . Vancomycin Rash    Will need Benadryl prior to administration per IV. "Red man syndrome"    Family History  Problem Relation Age of Onset  . Hypertension Mother   . Hypertension Father   . Prostate cancer Father   . Cancer Maternal Aunt        spine/back  . Cancer Paternal Grandfather        lung  . Healthy Son   . Healthy Son   . Healthy Son   . Healthy Son   . Colon cancer Neg Hx    Prior to Admission medications     Medication Sig Start Date End Date Taking? Authorizing Provider  ALPRAZolam Duanne Moron) 0.5 MG tablet Take 1 tablet (0.5 mg total) by mouth 2 (two) times daily as needed for anxiety. Patient taking differently: Take 1 mg by mouth at bedtime. *May take twice daily as needed for anxiety 02/21/18  Yes Luking, Elayne Snare, MD  dexamethasone (DECADRON) 4 MG tablet Take 1 tablet (4 mg total) by mouth daily. For 2 days after chemotherapy Patient taking differently: Take 4 mg by mouth daily. For 2 days after chemotherapy. Treatments are every 2 weeks 03/16/18  Yes Lockamy, Randi L, NP-C  docusate sodium (COLACE) 100 MG capsule Take 100 mg by mouth daily as needed for mild constipation.   Yes [provider]  fluorouracil CALGB 81191 in sodium chloride 0.9 % 150 mL Inject into the vein over 96 hr.   Yes [provider]  fluticasone (CUTIVATE) 0.05 % cream Apply 1 application topically 2 (two) times daily as needed. Skin irritation/rash 02/07/18  Yes  [provider]  leucovorin in dextrose 5 % 250 mL Inject into the vein once.   Yes [provider]  lidocaine-prilocaine (EMLA) cream Apply small amount to port site one hour prior to appointment and cover with plastic wrap. 03/13/18  Yes Derek Jack, MD  OXALIPLATIN IV Inject into the vein every 14 (fourteen) days.    Yes [provider]  prochlorperazine (COMPAZINE) 10 MG tablet Take 1 tablet (10 mg total) by mouth every 6 (six) hours as needed for nausea or vomiting. 03/27/18  Yes Lockamy, Randi L, NP-C  progesterone (PROMETRIUM) 100 MG capsule Take 100 mg by mouth at bedtime.   Yes [provider]  thyroid (ARMOUR) 90 MG tablet Take 90 mg by mouth daily before breakfast.    Yes [provider]  HYDROcodone-acetaminophen (NORCO) 5-325 MG tablet Take 1 tablet by mouth every 4 (four) hours as needed for moderate pain. Patient not taking: Reported on 03/27/2018 03/14/18   Aviva Signs, MD   prochlorperazine (COMPAZINE) 10 MG tablet Take 1 tablet (10 mg total) by mouth every 6 (six) hours as needed (NAUSEA). Patient not taking: Reported on 03/27/2018 03/13/18   Derek Jack, MD    Physical Exam: Vitals:   03/27/18 2028 03/27/18 2039  BP:  103/70  Pulse:  (!) 124  Resp:  20  Temp:  (!) 102.8 F (39.3 C)  TempSrc:  Oral  SpO2:  98%  Weight: 77.6 kg   Height: 5\' 7"  (1.702 m)     Constitutional: Looks acutely ill, but currently in NAD, calm, comfortable Eyes: PERRL, lids and conjunctivae normal ENMT: Mucous membranes are moist. Posterior pharynx clear of any exudate or lesions. Neck: normal, supple, no masses, no thyromegaly Respiratory: clear to auscultation bilaterally, no wheezing, no crackles. Normal respiratory effort. No accessory muscle use.  Cardiovascular: Tachycardic at 115 bpm, no murmurs / rubs / gallops. No extremity edema. 2+ pedal pulses. No carotid bruits.  Abdomen: Nondistended.  Bowel sounds positive. Soft, mild suprapubic tenderness, no guarding or rebound, no masses palpated. No hepatosplenomegaly.  Musculoskeletal: no clubbing / cyanosis. No joint deformity upper and lower extremities. Good ROM, no contractures. Normal muscle tone.  Skin: no rashes, lesions, ulcers on limited dermatological examination. Neurologic: CN 2-12 grossly intact. Sensation intact, DTR normal. Strength 5/5 in all 4.  Psychiatric: Normal judgment and insight. Alert and oriented x 4. Normal mood.   Labs on Admission: I have personally reviewed following labs and imaging studies  CBC: Recent Labs  Lab 03/27/18 0929 03/27/18 2115  WBC 3.1* 3.7*  NEUTROABS 1.7 3.5  HGB 12.3 11.5*  HCT 36.6 34.4*  MCV 95.1 95.0  PLT 125* 84*   Basic Metabolic Panel: Recent Labs  Lab 03/27/18 0929 03/27/18 2103  NA 140 138  K 3.1* 2.9*  CL 109 108  CO2 26 21*  GLUCOSE 107* 174*  BUN 9 10  CREATININE 0.92 0.81  CALCIUM 8.8* 9.0   GFR: Estimated Creatinine Clearance:  85.2 mL/min (by C-G formula based on SCr of 0.81 mg/dL). Liver Function Tests: Recent Labs  Lab 03/27/18 0929 03/27/18 2103  AST 32 66*  ALT 29 48*  ALKPHOS 55 65  BILITOT 2.6* 2.6*  PROT 6.9 7.1  ALBUMIN 4.1 4.3   No results for input(s): LIPASE, AMYLASE in the last 168 hours. No results for input(s): AMMONIA in the last 168 hours. Coagulation Profile: Recent Labs  Lab 03/27/18 2103  INR 1.00   Cardiac Enzymes: No results for input(s): CKTOTAL, CKMB, CKMBINDEX,  TROPONINI in the last 168 hours. BNP (last 3 results) No results for input(s): PROBNP in the last 8760 hours. HbA1C: No results for input(s): HGBA1C in the last 72 hours. CBG: No results for input(s): GLUCAP in the last 168 hours. Lipid Profile: No results for input(s): CHOL, HDL, LDLCALC, TRIG, CHOLHDL, LDLDIRECT in the last 72 hours. Thyroid Function Tests: No results for input(s): TSH, T4TOTAL, FREET4, T3FREE, THYROIDAB in the last 72 hours. Anemia Panel: No results for input(s): VITAMINB12, FOLATE, FERRITIN, TIBC, IRON, RETICCTPCT in the last 72 hours. Urine analysis: No results found for: COLORURINE, APPEARANCEUR, LABSPEC, [place], GLUCOSEU, [place], BILIRUBINUR, KETONESUR, PROTEINUR, UROBILINOGEN, NITRITE, LEUKOCYTESUR  Radiological Exams on Admission: Dg Chest Port 1 View  Result Date: 03/27/2018 CLINICAL DATA:  Fever EXAM: PORTABLE CHEST 1 VIEW COMPARISON:  03/14/2018 chest radiograph. FINDINGS: Left subclavian Port-A-Cath terminates in the middle third of the SVC. Stable cardiomediastinal silhouette with normal heart size. No pneumothorax. No pleural effusion. Lungs appear clear, with no acute consolidative airspace disease and no pulmonary edema. IMPRESSION: No active disease. Electronically Signed   By: Ilona Sorrel M.D.   On: 03/27/2018 21:29    EKG: Independently reviewed.  Vent. rate 131 BPM PR interval * ms QRS duration 85 ms QT/QTc 286/423 ms P-R-T axes 64 60 21 Sinus tachycardia Abnormal  R-wave progression, early transition Minimal ST depression, inferior leads  Assessment/Plan Principal Problem:   Sepsis due to undetermined organism ([place]) Admit to stepdown/inpatient. Continue IV fluids. Continue cefepime 2 g IVPB every 8 hours. Continue metronidazole 500 mg IVPB every 8 hours. Continue vancomycin per pharmacy. Premedicate with Benadryl before vancomycin infusion. Follow-up blood culture and sensitivity.  Active Problems:   Lactic acidosis Secondary to sepsis. Give LR bolus. Continue IV fluids. Follow-up lactic acid.    Hypokalemia Replacing. Check magnesium and phosphorus level. Follow-up potassium level.    Antineoplastic chemotherapy induced pancytopenia (HCC) Monitor CBC.    Hyperglycemia Secondary to prechemotherapy oral dexamethasone. Carbohydrate modified diet while hyperglycemic. Monitor CBG before meals and bedtime. Begin regular insulin sliding scale if the patient glucose persistently high.    Abnormal EKG No chest pain or dyspnea. Minimal ST depressions on inferior leads. Check echocardiogram.    History of thyroid cancer Continue Armour Thyroid.    Gilbert syndrome Monitor liver functions.    Hyperlipidemia Not on medical therapy due to [facility] syndrome. Patient to consider high-fiber diet and lifestyle modifications.    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HCC) 5-FU infusion pump will be held for now. Follow-up with Dr. Delton Coombes in the near future.    GERD (gastroesophageal reflux disease) Protonix 40 mg p.o. daily.    Anxiety Continue alprazolam as needed.     DVT prophylaxis: SCDs. Code Status: Full code. Family Communication:  Disposition Plan: Admit for IV hydration, IV antibiotics and electrolyte replacement. Consults called: Admission status: Inpatient/stepdown.   Reubin Milan MD [facility]   If 7PM-7AM, please contact night-coverage [email or web address] Password [email or web address]  03/27/2018, 11:39 PM   This  document was prepared using Dragon voice recognition software and may contain some unintended transcription errors.

## 2018-03-27 NOTE — Assessment & Plan Note (Signed)
1.  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 - Colonoscopy on 02/19/2018 shows rectal mass, 2 to 4 cm from the anal verge, normal sigmoid colon.  Biopsies consistent with adenocarcinoma. - MRI of the pelvis shows rectal adenocarcinoma, T stage-T3, N stage- N1-equivocal N2 nodes.  Maximum extension beyond muscularis propria is 6 mm (advanced T3).  Circumferential resection margin is 7 mm.  Right obturator node 6 mm.  Right external iliac node at 8 mm.  Craniocaudal extent of the tumor is 4.4 cm.  Approximately 180 degree circumferential.  Shortest distance from the distal tumor to anal sphincter is 2.1 cm.  Location from anal verge, 7.2 cm to the middle of the tumor. - CT CAP on 02/21/2018 shows 9 x 7 mm left lower lobe pulmonary nodule, new since 2009.  Possible subpleural left lower lobe 3 mm nodule versus area of pleural thickening.  Vague area of hypoattenuation in the posterior right hepatic lobe measuring 1.6 cm.  Differential includes benign/malignant lesion with altered perfusion.  Focal steatosis is possible. - MRI of the liver on 03/08/2018 shows 1.4 cm lesion in the posterior right hepatic lobe.  There is a 5 mm focus of delayed hyperenhancement in the lateral segment of the left liver, questionable for a second metastatic deposit. -PET CT scan on 03/05/2018 shows hypermetabolic rectal mass.  Left perirectal hypermetabolic and sigmoid mesocolon lymph nodes.  Single hepatic metastatic lesion in the right hepatic lobe.  7 mm pulmonary nodule at the left lung base is suspicious for meta stasis. -Liver biopsy on 03/09/2018 consistent with adenocarcinoma.  -We will obtain foundation 1 and MSI testing. - Cycle 1 FOLFOX on 03/14/2018.  She had constipation lasting for 5 days.  She also had cold sensitivity for few days.  She had some nausea but denied any  vomiting. -She may proceed with cycle 2 FOLFOX today.  I am reluctant to add bevacizumab today as she is having occasional rectal bleeding from her malignancy.  Rectal bleeding has improved from prior to start of therapy. - Her bilirubin was elevated about 2.  I am reluctant to add Irinotecan at this time.  Upon further questioning she reports that she was diagnosed with Gilbert's syndrome and her 64s. -We will send a UG T1 A1 level. -We will see her back in 2 weeks for follow-up.  2.  Thyroid cancer: -She had a left thyroidectomy done about 6 years ago.  She is on Synthroid at this time. -We plan to check her TSH, thyroglobulin and antithyroglobulin antibodies in later part of this month.

## 2018-03-27 NOTE — Progress Notes (Signed)
[facility] [place], [place] [address]   CLINIC:  Medical Oncology/Hematology  PCP:  [name], MD [address] [place] [address] [phone]   REASON FOR VISIT: Follow-up for rectal cancer  CURRENT THERAPY: Folfox   BRIEF ONCOLOGIC HISTORY:    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[place])   02/26/2018 Initial Diagnosis    Rectal cancer ([place])    03/13/2018 Cancer Staging    Staging form: Colon and Rectum, AJCC 8th Edition - Clinical stage from 03/13/2018: Stage IVA (cT3, cN1b, cM1a) - Signed by Derek Jack, MD on 03/13/2018    03/14/2018 -  Chemotherapy    The patient had palonosetron (ALOXI) injection 0.25 mg, 0.25 mg, Intravenous,  Once, 2 of 6 cycles Administration: 0.25 mg (03/14/2018), 0.25 mg (03/27/2018) bevacizumab (AVASTIN) 400 mg in sodium chloride 0.9 % 100 mL chemo infusion, 5 mg/kg = 400 mg, Intravenous,  Once, 0 of 4 cycles irinotecan (CAMPTOSAR) 320 mg in dextrose 5 % 500 mL chemo infusion, 165 mg/m2 = 320 mg, Intravenous,  Once, 0 of 4 cycles leucovorin 800 mg in dextrose 5 % 250 mL infusion, 772 mg, Intravenous,  Once, 2 of 6 cycles Administration: 800 mg (03/14/2018), 800 mg (03/27/2018) oxaliplatin (ELOXATIN) 165 mg in dextrose 5 % 500 mL chemo infusion, 85 mg/m2 = 165 mg, Intravenous,  Once, 2 of 6 cycles Administration: 165 mg (03/14/2018), 165 mg (03/27/2018) fluorouracil (ADRUCIL) chemo injection 750 mg, 400 mg/m2 = 750 mg (100 % of original dose 400 mg/m2), Intravenous,  Once, 1 of 1 cycle Dose modification: 400 mg/m2 (original dose 400 mg/m2, Cycle 1) Administration: 750 mg (03/14/2018) fluorouracil (ADRUCIL) 4,650 mg in sodium chloride 0.9 % 57 mL chemo infusion, 2,400 mg/m2 = 4,650 mg, Intravenous, 1 Day/Dose, 2 of 6 cycles Administration: 4,650 mg (03/14/2018)  for chemotherapy treatment.       CANCER STAGING: Cancer Staging Malignant neoplasm of rectum [facility]) Staging form: Colon and Rectum,  AJCC 8th Edition - Clinical stage from 03/13/2018: Stage IVA (cT3, cN1b, cM1a) - Signed by Derek Jack, MD on 03/13/2018    INTERVAL HISTORY:  Ms. Carlino 52 y.o. female returns for routine follow-up for rectal cancer. She is here today with her husband. She has cold sensitivity and numbness in her hands and fingers for a week after treatment but it has improved. She had constipation after her last treatment. She was also nauseous for a few days. All of these symptoms have improved and she is feeling better than she did before. She is still having mild blood in her stool.  Denies any vomiting or diarrhea. Denies any new pains. Had not noticed any recent bleeding such as epistaxis or hematuria. Denies recent chest pain on exertion, shortness of breath on minimal exertion, pre-syncopal episodes, or palpitations. Denies any recent fevers, infections, or recent hospitalizations. She reports her appetite as low and her energy level is 75%.     REVIEW OF SYSTEMS:  Review of Systems  Constitutional: Positive for chills.  Gastrointestinal: Positive for constipation and nausea.     PAST MEDICAL/SURGICAL HISTORY:  Past Medical History:  Diagnosis Date  . Anxiety   . Cancer [facility]) 2013   thyroid  . Endometrial polyp   . Endometrial thickening on ultra sound   . GERD (gastroesophageal reflux disease)   . H/O Hashimoto thyroiditis   . 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  Hypothyroidism    Past Surgical History:  Procedure Laterality Date  . BIOPSY  02/19/2018   Procedure: BIOPSY;  Surgeon: Rogene Houston, MD;  [facility];  Service: Endoscopy;;  rectum  . COLONOSCOPY N/A 08/20/2015   Procedure: COLONOSCOPY;  Surgeon: Rogene Houston, MD;  [facility];  Service: Endoscopy;  Laterality: N/A;  730  . [place] ENDOMETRIAL ABLATION   08-13-2004  . FLEXIBLE SIGMOIDOSCOPY N/A 02/19/2018   Procedure: FLEXIBLE SIGMOIDOSCOPY;  Surgeon: Rogene Houston, MD;  [facility];  Service: Endoscopy;  Laterality: N/A;  . HYSTEROSCOPY W/D&C N/A 04/30/2015   Procedure: DILATATION AND CURETTAGE / INTENDED HYSTEROSCOPY;  Surgeon: Dian Queen, MD;  Location: [place];  Service: Gynecology;  Laterality: N/A;  . IR US GUIDE BX ASP/DRAIN  03/09/2018  . LAPAROSCOPIC CHOLECYSTECTOMY  04/1996  . POLYPECTOMY  08/20/2015   Procedure: POLYPECTOMY;  Surgeon: Rogene Houston, MD;  [facility];  Service: Endoscopy;;  Splenic flexure polypectomy  . POLYPECTOMY  02/19/2018   Procedure: POLYPECTOMY;  Surgeon: Rogene Houston, MD;  [facility];  Service: Endoscopy;;  rectum  . PORTACATH PLACEMENT N/A 03/14/2018   Procedure: INSERTION PORT-A-CATH;  Surgeon: Aviva Signs, MD;  Location: [facility];  Service: General;  Laterality: N/A;  . REDUCTION INCARCERATED UTERUS  06-20-2000   intrauterine preg. 13 wks /  urinary retention  . THYROID LOBECTOMY  11/24/2011   Procedure: THYROID LOBECTOMY;  Surgeon: Earnstine Regal, MD;  Location: [facility];  Service: General;  Laterality: Left;  Left Thyroid Lobectomy  . TUBAL LIGATION  2002     SOCIAL HISTORY:  Social History   Socioeconomic History  . Marital status: Married    Spouse name: Not on file  . Number of children: 4  . Years of education: Not on file  . Highest education level: Not on file  Occupational History  . Not on file  Social Needs  . Financial resource strain: Not hard at all  . Food insecurity:    Worry: Never true    Inability: Never true  . Transportation needs:    Medical: No    Non-medical: No  Tobacco Use  . Smoking status: Former Smoker    Packs/day: 0.25    Years: 10.00    Pack years: 2.50    Types: Cigarettes    Last attempt to quit: 10/31/1991    Years since quitting: 26.4  . Smokeless tobacco: Never Used  Substance and  Sexual Activity  . Alcohol use: No  . Drug use: No  . Sexual activity: Not Currently  Lifestyle  . Physical activity:    Days per week: 0 days    Minutes per session: 0 min  . Stress: To some extent  Relationships  . Social connections:    Talks on phone: More than three times a week    Gets together: Twice a week    Attends religious service: More than 4 times per year    Active member of club or organization: Yes    Attends meetings of clubs or organizations: More than 4 times per year    Relationship status: Married  . Intimate partner violence:    Fear of current or ex partner: No    Emotionally abused: No    Physically abused: No    Forced sexual activity: No  Other Topics Concern  . Not on file  Social History Narrative  .  Not on file    FAMILY HISTORY:  Family History  Problem Relation Age of Onset  . Hypertension Mother   . Hypertension Father   . Prostate cancer Father   . Cancer Maternal Aunt        spine/back  . Cancer Paternal Grandfather        lung  . Healthy Son   . Healthy Son   . Healthy Son   . Healthy Son   . Colon cancer Neg Hx     CURRENT MEDICATIONS:  Outpatient Encounter Medications as of 03/27/2018  Medication Sig Note  . ALPRAZolam (XANAX) 0.5 MG tablet Take 1 tablet (0.5 mg total) by mouth 2 (two) times daily as needed for anxiety.   Marland Kitchen dexamethasone (DECADRON) 4 MG tablet Take 1 tablet (4 mg total) by mouth daily. For 2 days after chemotherapy   . fluorouracil CALGB 99371 in sodium chloride 0.9 % 150 mL Inject into the vein over 96 hr.   . fluticasone (CUTIVATE) 0.05 % cream Apply 1 application topically 2 (two) times daily as needed. Skin irritation/rash   . HYDROcodone-acetaminophen (NORCO) 5-325 MG tablet Take 1 tablet by mouth every 4 (four) hours as needed for moderate pain. (Patient not taking: Reported on 03/27/2018)   . ketotifen (ALAWAY) 0.025 % ophthalmic solution Place 1 drop into both eyes daily as needed (for allergy eyes.).    Marland Kitchen leucovorin in dextrose 5 % 250 mL Inject into the vein once.   . lidocaine-prilocaine (EMLA) cream Apply small amount to port site one hour prior to appointment and cover with plastic wrap.   . OXALIPLATIN IV Inject into the vein.   Marland Kitchen prochlorperazine (COMPAZINE) 10 MG tablet Take 1 tablet (10 mg total) by mouth every 6 (six) hours as needed (NAUSEA).   . prochlorperazine (COMPAZINE) 10 MG tablet Take 1 tablet (10 mg total) by mouth every 6 (six) hours as needed for nausea or vomiting.   . progesterone (PROMETRIUM) 100 MG capsule Take 100 mg by mouth at bedtime.   Marland Kitchen thyroid (ARMOUR) 90 MG tablet Take 90 mg by mouth daily before breakfast.    . [DISCONTINUED] calcium carbonate (TUMS - DOSED IN MG ELEMENTAL CALCIUM) 500 MG chewable tablet Chew 1 tablet by mouth 3 (three) times daily as needed for indigestion or heartburn.  02/12/2018: rarely  . [DISCONTINUED] ibuprofen (ADVIL,MOTRIN) 200 MG tablet Take 400 mg by mouth every 8 (eight) hours as needed (for pain.).     No facility-administered encounter medications on file as of 03/27/2018.     ALLERGIES:  Please see epic.  PHYSICAL EXAM:  ECOG Performance status: 1  Vitals:   03/27/18 0944  BP: 105/73  Pulse: 87  Resp: 18  Temp: 97.7 F (36.5 C)  SpO2: 100%   Filed Weights   03/27/18 0944  Weight: 171 lb 1.2 oz (77.6 kg)    Physical Exam Constitutional:      Appearance: Normal appearance. She is normal weight.  Cardiovascular:     Rate and Rhythm: Normal rate and regular rhythm.     Heart sounds: Normal heart sounds.  Pulmonary:     Effort: Pulmonary effort is normal.     Breath sounds: Normal breath sounds.  Musculoskeletal: Normal range of motion.  Skin:    General: Skin is warm and dry.  Neurological:     Mental Status: She is alert and oriented to person, place, and time. Mental status is at baseline.  Psychiatric:  Mood and Affect: Mood normal.        Behavior: Behavior normal.        Thought Content:  Thought content normal.        Judgment: Judgment normal.      LABORATORY DATA:  I have reviewed the labs as listed.  CBC    Component Value Date/Time   WBC 3.1 (L) 03/27/2018 0929   RBC 3.85 (L) 03/27/2018 0929   HGB 12.3 03/27/2018 0929   HGB 13.5 11/03/2015 0841   HCT 36.6 03/27/2018 0929   HCT 39.7 11/03/2015 0841   PLT 125 (L) 03/27/2018 0929   PLT 165 11/03/2015 0841   MCV 95.1 03/27/2018 0929   MCV 95 11/03/2015 0841   MCH 31.9 03/27/2018 0929   MCHC 33.6 03/27/2018 0929   RDW 13.8 03/27/2018 0929   RDW 13.2 11/03/2015 0841   LYMPHSABS 1.2 03/27/2018 0929   LYMPHSABS 1.4 11/03/2015 0841   MONOABS 0.3 03/27/2018 0929   EOSABS 0.0 03/27/2018 0929   EOSABS 0.1 11/03/2015 0841   BASOSABS 0.0 03/27/2018 0929   BASOSABS 0.0 11/03/2015 0841   CMP Latest Ref Rng & Units 03/27/2018 03/09/2018 03/06/2018  Glucose 70 - 99 mg/dL 107(H) 99 108(H)  BUN 6 - 20 mg/dL '9 15 13  '$ Creatinine 0.44 - 1.00 mg/dL 0.92 0.82 0.77  Sodium 135 - 145 mmol/L 140 138 138  Potassium 3.5 - 5.1 mmol/L 3.1(L) 3.2(L) 3.2(L)  Chloride 98 - 111 mmol/L 109 99 104  CO2 22 - 32 mmol/L '26 23 25  '$ Calcium 8.9 - 10.3 mg/dL 8.8(L) 9.3 9.4  Total Protein 6.5 - 8.1 g/dL 6.9 7.3 7.9  Total Bilirubin 0.3 - 1.2 mg/dL 2.6(H) 2.4(H) 2.6(H)  Alkaline Phos 38 - 126 U/L 55 59 72  AST 15 - 41 U/L 32 26 37  ALT 0 - 44 U/L 29 31 44       DIAGNOSTIC IMAGING:  I have independently reviewed the scans and discussed with the patient.   I have reviewed Francene Finders, NP's note and agree with the documentation.  I personally performed a face-to-face visit, made revisions and my assessment and plan is as follows.    ASSESSMENT & PLAN:   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HCC) 1.  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 - Colonoscopy on 02/19/2018 shows rectal mass, 2 to 4 cm from  the anal verge, normal sigmoid colon.  Biopsies consistent with adenocarcinoma. - MRI of the pelvis shows rectal adenocarcinoma, T stage-T3, N stage- N1-equivocal N2 nodes.  Maximum extension beyond muscularis propria is 6 mm (advanced T3).  Circumferential resection margin is 7 mm.  Right obturator node 6 mm.  Right external iliac node at 8 mm.  Craniocaudal extent of the tumor is 4.4 cm.  Approximately 180 degree circumferential.  Shortest distance from the distal tumor to anal sphincter is 2.1 cm.  Location from anal verge, 7.2 cm to the middle of the tumor. - CT CAP on 02/21/2018 shows 9 x 7 mm left lower lobe pulmonary nodule, new since 2009.  Possible subpleural left lower lobe 3 mm nodule versus area of pleural thickening.  Vague area of hypoattenuation in the posterior right hepatic lobe measuring 1.6 cm.  Differential includes benign/malignant lesion with altered perfusion.  Focal steatosis is possible. - MRI of the liver on 03/08/2018 shows 1.4 cm lesion in  the posterior right hepatic lobe.  There is a 5 mm focus of delayed hyperenhancement in the lateral segment of the left liver, questionable for a second metastatic deposit. -PET CT scan on 03/05/2018 shows hypermetabolic rectal mass.  Left perirectal hypermetabolic and sigmoid mesocolon lymph nodes.  Single hepatic metastatic lesion in the right hepatic lobe.  7 mm pulmonary nodule at the left lung base is suspicious for meta stasis. -Liver biopsy on 03/09/2018 consistent with adenocarcinoma.  -We will obtain foundation 1 and MSI testing. - Cycle 1 FOLFOX on 03/14/2018.  She had constipation lasting for 5 days.  She also had cold sensitivity for few days.  She had some nausea but denied any vomiting. -She may proceed with cycle 2 FOLFOX today.  I am reluctant to add bevacizumab today as she is having occasional rectal bleeding from her malignancy.  Rectal bleeding has improved from prior to start of therapy. - Her bilirubin was elevated about 2.   I am reluctant to add Irinotecan at this time.  Upon further questioning she reports that she was diagnosed with Gilbert's syndrome and her 62s. -We will send a UG T1 A1 level. -We will see her back in 2 weeks for follow-up.  2.  Thyroid cancer: -She had a left thyroidectomy done about 6 years ago.  She is on Synthroid at this time. -We plan to check her TSH, thyroglobulin and antithyroglobulin antibodies in later part of this month.      Orders placed this encounter:  Orders Placed This Encounter  Procedures  . CEA  . CBC with Differential/Platelet  . Comprehensive metabolic panel      Derek Jack, MD [place] [phone]

## 2018-03-27 NOTE — Progress Notes (Signed)
1100 lab work reviewed and pt seen by Dr. Delton Coombes who approved patient for FOLFOX today. Per MD, we will hold off on starting Avastin d/t pt still having rectal bleeding and we will hold off on starting Irinotecan d/t increased bilirubin. Ok to proceed with FOLFOX today.  Victory Dakin Rollyson tolerated FOLFOX without incident or complaint. VSS upon completion of treatment. 5FU infusing via home infusion pump without difficulties. Discharged self ambulatory in satisfactory condition in presence of husband.

## 2018-03-27 NOTE — Progress Notes (Signed)
Pharmacy Note:  Initial antibiotic(s) regimen of Vancomycin and Cefepime ordered by EDP to treat unknown source.  Estimated Creatinine Clearance: 75 mL/min (by C-G formula based on SCr of 0.92 mg/dL).   Allergies  Allergen Reactions  . Demerol [Meperidine] Itching    All over the body.  . Penicillins Hives     childhood, does not remember if it spread all over the body or not. Has patient had a PCN reaction causing immediate rash, facial/tongue/throat swelling, SOB or lightheadedness with hypotension: No, gives Has patient had a PCN reaction causing severe rash involving mucus membranes or skin necrosis: no Has patient had a PCN reaction that required hospitalization no Has patient had a PCN reaction occurring within the last 10 years: No  . Other Other (See Comments)    COCONUT-HIVES     Vitals:   03/27/18 2039  BP: 103/70  Pulse: (!) 124  Resp: 20  Temp: (!) 102.8 F (39.3 C)  SpO2: 98%    Anti-infectives (From admission, onward)   Start     Dose/Rate Route Frequency Ordered Stop   03/27/18 2130  vancomycin (VANCOCIN) 1,500 mg in sodium chloride 0.9 % 500 mL IVPB     1,500 mg 250 mL/hr over 120 Minutes Intravenous  Once 03/27/18 2103     03/27/18 2100  ceFEPIme (MAXIPIME) 2 g in sodium chloride 0.9 % 100 mL IVPB     2 g 200 mL/hr over 30 Minutes Intravenous  Once 03/27/18 2057     03/27/18 2100  metroNIDAZOLE (FLAGYL) IVPB 500 mg     500 mg 100 mL/hr over 60 Minutes Intravenous Every 8 hours 03/27/18 2057     03/27/18 2100  vancomycin (VANCOCIN) IVPB 1000 mg/200 mL premix  Status:  Discontinued     1,000 mg 200 mL/hr over 60 Minutes Intravenous  Once 03/27/18 2057 03/27/18 2103      Plan: Initial dose(s) of Vancomycin 1500mg  IV loading dose and Cefepime 2gm IV X 1 ordered. F/U admission orders for further dosing if therapy continued.  Isac Sarna, BS [place], [place] Clinical Pharmacist Pager [phone] 03/27/2018 9:04 PM

## 2018-03-28 ENCOUNTER — Other Ambulatory Visit: Payer: Self-pay

## 2018-03-28 DIAGNOSIS — F419 Anxiety disorder, unspecified: Secondary | ICD-10-CM

## 2018-03-28 DIAGNOSIS — K219 Gastro-esophageal reflux disease without esophagitis: Secondary | ICD-10-CM

## 2018-03-28 DIAGNOSIS — D61810 Antineoplastic chemotherapy induced pancytopenia: Secondary | ICD-10-CM

## 2018-03-28 DIAGNOSIS — T451X5A Adverse effect of antineoplastic and immunosuppressive drugs, initial encounter: Secondary | ICD-10-CM

## 2018-03-28 DIAGNOSIS — R6520 Severe sepsis without septic shock: Secondary | ICD-10-CM

## 2018-03-28 DIAGNOSIS — A419 Sepsis, unspecified organism: Secondary | ICD-10-CM | POA: Diagnosis present

## 2018-03-28 DIAGNOSIS — R9431 Abnormal electrocardiogram [ECG] [EKG]: Secondary | ICD-10-CM | POA: Diagnosis present

## 2018-03-28 LAB — URINALYSIS, ROUTINE W REFLEX MICROSCOPIC
Bilirubin Urine: NEGATIVE
Glucose, UA: NEGATIVE mg/dL
Hgb urine dipstick: NEGATIVE
Ketones, ur: NEGATIVE mg/dL
Leukocytes, UA: NEGATIVE
Nitrite: NEGATIVE
Protein, ur: NEGATIVE mg/dL
Specific Gravity, Urine: 1.014 (ref 1.005–1.030)
pH: 5 (ref 5.0–8.0)

## 2018-03-28 LAB — COMPREHENSIVE METABOLIC PANEL
ALBUMIN: 3 g/dL — AB (ref 3.5–5.0)
ALT: 97 U/L — ABNORMAL HIGH (ref 0–44)
AST: 167 U/L — ABNORMAL HIGH (ref 15–41)
Alkaline Phosphatase: 50 U/L (ref 38–126)
Anion gap: 5 (ref 5–15)
BILIRUBIN TOTAL: 2.5 mg/dL — AB (ref 0.3–1.2)
BUN: 10 mg/dL (ref 6–20)
CO2: 18 mmol/L — ABNORMAL LOW (ref 22–32)
Calcium: 7.8 mg/dL — ABNORMAL LOW (ref 8.9–10.3)
Chloride: 117 mmol/L — ABNORMAL HIGH (ref 98–111)
Creatinine, Ser: 0.72 mg/dL (ref 0.44–1.00)
GFR calc Af Amer: >60 mL/min (ref >60)
GLUCOSE: 144 mg/dL — AB (ref 70–99)
Potassium: 3.7 mmol/L (ref 3.5–5.1)
Sodium: 140 mmol/L (ref 135–145)
Total Protein: 5 g/dL — ABNORMAL LOW (ref 6.5–8.1)

## 2018-03-28 LAB — CBC WITH DIFFERENTIAL/PLATELET
Abs Immature Granulocytes: 0.01 10*3/uL (ref 0.00–0.07)
Basophils Absolute: 0 10*3/uL (ref 0.0–0.1)
Basophils Relative: 0 %
EOS ABS: 0 10*3/uL (ref 0.0–0.5)
Eosinophils Relative: 0 %
HCT: 26.6 % — ABNORMAL LOW (ref 36.0–46.0)
Hemoglobin: 8.7 g/dL — ABNORMAL LOW (ref 12.0–15.0)
Immature Granulocytes: 1 %
Lymphocytes Relative: 3 %
Lymphs Abs: 0.1 10*3/uL — ABNORMAL LOW (ref 0.7–4.0)
MCH: 31.9 pg (ref 26.0–34.0)
MCHC: 32.7 g/dL (ref 30.0–36.0)
MCV: 97.4 fL (ref 80.0–100.0)
Monocytes Absolute: 0.1 10*3/uL (ref 0.1–1.0)
Monocytes Relative: 6 %
NEUTROS PCT: 90 %
Neutro Abs: 1.6 10*3/uL — ABNORMAL LOW (ref 1.7–7.7)
Platelets: 59 10*3/uL — ABNORMAL LOW (ref 150–400)
RBC: 2.73 MIL/uL — ABNORMAL LOW (ref 3.87–5.11)
RDW: 14.1 % (ref 11.5–15.5)
WBC: 1.8 10*3/uL — ABNORMAL LOW (ref 4.0–10.5)
nRBC: 0 % (ref 0.0–0.2)

## 2018-03-28 LAB — PHOSPHORUS: Phosphorus: 1 mg/dL — CL (ref 2.5–4.6)

## 2018-03-28 LAB — TROPONIN I
Troponin I: <0.03 ng/mL (ref <0.03)
Troponin I: <0.03 ng/mL (ref <0.03)

## 2018-03-28 LAB — PROCALCITONIN: PROCALCITONIN: 0.21 ng/mL

## 2018-03-28 LAB — LACTIC ACID, PLASMA
Lactic Acid, Venous: 2.7 mmol/L (ref 0.5–1.9)
Lactic Acid, Venous: 1.7 mmol/L (ref 0.5–1.9)

## 2018-03-28 LAB — MAGNESIUM: Magnesium: 2 mg/dL (ref 1.7–2.4)

## 2018-03-28 LAB — APTT: aPTT: 28 seconds (ref 24–36)

## 2018-03-28 MED ORDER — HEPARIN SOD (PORK) LOCK FLUSH 100 UNIT/ML IV SOLN
INTRAVENOUS | Status: AC
  Filled 2018-03-28: qty 5

## 2018-03-28 MED ORDER — DOCUSATE SODIUM 100 MG PO CAPS: 100.0000 mg | ORAL_CAPSULE | Freq: Every day | ORAL | Status: DC | PRN

## 2018-03-28 MED ORDER — POTASSIUM PHOSPHATE MONOBASIC 500 MG PO TABS
1000.0000 mg | ORAL_TABLET | Freq: Once | ORAL | Status: DC
  Filled 2018-03-28: qty 2

## 2018-03-28 MED ORDER — ACETAMINOPHEN 325 MG PO TABS
ORAL_TABLET | ORAL | Status: AC
  Filled 2018-03-28: qty 2

## 2018-03-28 MED ORDER — PROCHLORPERAZINE MALEATE 5 MG PO TABS
10.0000 mg | ORAL_TABLET | Freq: Four times a day (QID) | ORAL | Status: DC | PRN
  Administered 2018-03-28 (×2): 10 mg via ORAL
  Filled 2018-03-28 (×2): qty 2

## 2018-03-28 MED ORDER — HEPARIN SOD (PORK) LOCK FLUSH 100 UNIT/ML IV SOLN
INTRAVENOUS | Status: AC
  Administered 2018-03-28: 04:00:00
  Filled 2018-03-28: qty 5

## 2018-03-28 MED ORDER — LEVOFLOXACIN IN D5W 750 MG/150ML IV SOLN
750.0000 mg | INTRAVENOUS | Status: DC
  Administered 2018-03-28 – 2018-03-29 (×2): 750 mg via INTRAVENOUS
  Filled 2018-03-28 (×2): qty 150

## 2018-03-28 MED ORDER — K PHOS MONO-SOD PHOS DI & MONO 155-852-130 MG PO TABS
500.0000 mg | ORAL_TABLET | Freq: Once | ORAL | Status: AC
  Administered 2018-03-28: 500 mg via ORAL
  Filled 2018-03-28: qty 2

## 2018-03-28 MED ORDER — DEXAMETHASONE 4 MG PO TABS
4.0000 mg | ORAL_TABLET | Freq: Every day | ORAL | Status: AC
  Administered 2018-03-28 – 2018-03-29 (×2): 4 mg via ORAL
  Filled 2018-03-28 (×2): qty 1

## 2018-03-28 MED ORDER — POTASSIUM CHLORIDE IN NACL 40-0.9 MEQ/L-% IV SOLN
INTRAVENOUS | Status: DC
  Administered 2018-03-28: 175 mL/h via INTRAVENOUS
  Administered 2018-03-29: 125 mL/h via INTRAVENOUS
  Filled 2018-03-28 (×5): qty 1000

## 2018-03-28 MED ORDER — POTASSIUM PHOSPHATES 15 MMOLE/5ML IV SOLN
30.0000 mmol | Freq: Once | INTRAVENOUS | Status: AC
  Administered 2018-03-28 (×2): 30 mmol via INTRAVENOUS
  Filled 2018-03-28 (×2): qty 10

## 2018-03-28 MED ORDER — ACETAMINOPHEN 325 MG PO TABS
650.0000 mg | ORAL_TABLET | Freq: Four times a day (QID) | ORAL | Status: DC | PRN
  Administered 2018-03-28: 650 mg via ORAL

## 2018-03-28 MED ORDER — MAGNESIUM SULFATE 2 GM/50ML IV SOLN
2.0000 g | Freq: Once | INTRAVENOUS | Status: AC
  Administered 2018-03-28: 2 g via INTRAVENOUS
  Filled 2018-03-28: qty 50

## 2018-03-28 MED ORDER — LACTATED RINGERS IV BOLUS
1000.0000 mL | Freq: Once | INTRAVENOUS | Status: AC
  Administered 2018-03-28: 1000 mL via INTRAVENOUS

## 2018-03-28 MED ORDER — THYROID 30 MG PO TABS
90.0000 mg | ORAL_TABLET | Freq: Every day | ORAL | Status: DC
  Administered 2018-03-29: 90 mg via ORAL
  Filled 2018-03-28 (×3): qty 3

## 2018-03-28 MED ORDER — MAGNESIUM SULFATE 2 GM/50ML IV SOLN
INTRAVENOUS | Status: AC
  Administered 2018-03-28: 2 g via INTRAVENOUS
  Filled 2018-03-28: qty 50

## 2018-03-28 MED ORDER — THYROID 60 MG PO TABS
90.0000 mg | ORAL_TABLET | Freq: Every day | ORAL | Status: DC
  Filled 2018-03-28: qty 1

## 2018-03-28 MED ORDER — ALPRAZOLAM 1 MG PO TABS
1.0000 mg | ORAL_TABLET | Freq: Every evening | ORAL | Status: DC | PRN
  Administered 2018-03-28: 1 mg via ORAL
  Filled 2018-03-28: qty 1

## 2018-03-28 MED ORDER — PROGESTERONE MICRONIZED 100 MG PO CAPS
100.0000 mg | ORAL_CAPSULE | Freq: Every day | ORAL | Status: DC
  Administered 2018-03-28: 100 mg via ORAL
  Filled 2018-03-28 (×2): qty 1

## 2018-03-28 MED ORDER — ACETAMINOPHEN 500 MG PO TABS
500.0000 mg | ORAL_TABLET | Freq: Once | ORAL | Status: AC
  Administered 2018-03-28: 500 mg via ORAL
  Filled 2018-03-28: qty 1

## 2018-03-28 MED ORDER — SODIUM CHLORIDE 0.9 % IV BOLUS: 1000.0000 mL | Freq: Once | INTRAVENOUS | Status: DC

## 2018-03-28 NOTE — ED Notes (Signed)
Pt bp's a little higher and pt does not want fluids at this time, pt has swleling noted to hands and face also.

## 2018-03-28 NOTE — ED Notes (Signed)
Pt has had 3 loose bms with mucous and blood tinged. Husband states pt has had blood in stools and this is not new. Per pharmacist, finish Pot phosphate then start vancomycin. Pt/husband aware

## 2018-03-28 NOTE — ED Notes (Signed)
Per pharmacist, mag sulfate is compatible with pot phosphate and Na 81meq potassium

## 2018-03-28 NOTE — ED Notes (Signed)
Dr Wynetta Emery aware of bp's

## 2018-03-28 NOTE — ED Notes (Signed)
CRITICAL VALUE ALERT  Critical Value:  Phosphorus 1.0  Date & Time Notied:  03/28/18 0239  Provider Notified: Tennis Must, MD  Orders Received/Actions taken: n/a

## 2018-03-28 NOTE — Progress Notes (Signed)
PROGRESS NOTE   Samantha Reynolds  OBS:[id number]  DOB: [birth year]  DOA: 03/27/2018 PCP: [name], MD   Brief Admission Hx: Samantha Reynolds is a 53 y.o. female with medical history significant of anxiety, thyroid cancer, thyroid lobectomy, Hashimoto's thyroiditis, hypothyroidism, GERD, endometrial polyp and thickening on ultrasound, Gilbert syndrome, hyperlipidemia, rectal carcinoma who underwent chemotherapy earlier today with FOLFOX.  She is also on 5-FU home infusion pump.  She was discharged from the cancer center after the FOLFOX infusion around 1400.  Around 1800 the patient started feeling nauseous and having chills.   MDM/Assessment & Plan: 1. Severe sepsis - unknown source - continue supportive therapy, continue broad spectrum antibiotics : Pt apparently experienced red man reaction from vancomycin and was discontinued, I discussed with pharmacy team and will Rx levofloxacin to cover pseudomonas. Continue to follow closely.   2. Lactic acidosis - treating with aggressive fluid replacement, follow lactic acid level.  3.  Hypokalemia - replete and recheck in AM.  4. Antineoplastic chemotherapy induced pancytopenia - follow closely, neutropenic precautions.  5. Hyperglycemia from steroids - follow.  6. Hypothyroidism - continue home armour thyroid 7. HLE - had not been on medical therapy due to [place] syndrom.   8. Rectal cancer - per oncologist Dr. Delton Coombes, hold 5-FU infusion pump for now.  9. GERD - protonix ordered.  10. GAD - resume home alprazolam as needed.  11. Hypotension - bolus IVFs, consider pressors if no improvement.    DVT prophylaxis: SCDS Code Status: FULL  Family Communication: husband  Disposition Plan: Home when medically stabilized  Antimicrobials:  Vancomycin 12/31  Metronidazole 12/31  Cefepime 12/31  Levofloxacin 1/1    Subjective: Pt feels weak and ill.  No diarrhea.  No chest pain and no SOB.  Pt having fever.  Pt reports  nonproductive cough.    Objective: Vitals:   03/28/18 0930 03/28/18 1100 03/28/18 1119 03/28/18 1130  BP: 96/66 (!) 89/57 (!) 88/56 (!) 85/60  Pulse:   87 81  Resp: 19 (!) 25 20 (!) 21  Temp:      TempSrc:      SpO2:   100% 99%  Weight:      Height:        Intake/Output Summary (Last 24 hours) at 03/28/2018 1247 Last data filed at 03/28/2018 1019 Gross per 24 hour  Intake 6128.94 ml  Output -  Net 6128.94 ml   Filed Weights   03/27/18 2028  Weight: 77.6 kg   REVIEW OF SYSTEMS  As per history otherwise all reviewed and reported negative  Exam:  General exam: awake, alert, NAD, cooperative.  Pt does appear ill.  Pt is pale.  Dry mucus membranes.   Respiratory system: Clear. No increased work of breathing. Cardiovascular system: S1 & S2 heard, RRR. No JVD, murmurs, gallops, clicks or pedal edema. Gastrointestinal system: Abdomen is nondistended, soft and nontender. Normal bowel sounds heard. Central nervous system: Alert and oriented. No focal neurological deficits. Extremities: no CCE.  Data Reviewed: Basic Metabolic Panel: Recent Labs  Lab 03/27/18 0929 03/27/18 2103 03/27/18 2115 03/28/18 0303  NA 140 138  --  140  K 3.1* 2.9*  --  3.7  CL 109 108  --  117*  CO2 26 21*  --  18*  GLUCOSE 107* 174*  --  144*  BUN 9 10  --  10  CREATININE 0.92 0.81  --  0.72  CALCIUM 8.8* 9.0  --  7.8*  MG  --   --  2.0  --   PHOS  --   --  1.0*  --    Liver Function Tests: Recent Labs  Lab 03/27/18 0929 03/27/18 2103 03/28/18 0303  AST 32 66* 167*  ALT 29 48* 97*  ALKPHOS 55 65 50  BILITOT 2.6* 2.6* 2.5*  PROT 6.9 7.1 5.0*  ALBUMIN 4.1 4.3 3.0*   No results for input(s): LIPASE, AMYLASE in the last 168 hours. No results for input(s): AMMONIA in the last 168 hours. CBC: Recent Labs  Lab 03/27/18 0929 03/27/18 2115 03/28/18 0303  WBC 3.1* 3.7* 1.8*  NEUTROABS 1.7 3.5 1.6*  HGB 12.3 11.5* 8.7*  HCT 36.6 34.4* 26.6*  MCV 95.1 95.0 97.4  PLT 125* 84* 59*    Cardiac Enzymes: Recent Labs  Lab 03/28/18 0303 03/28/18 0908  TROPONINI <0.03 <0.03   CBG (last 3)  No results for input(s): GLUCAP in the last 72 hours. Recent Results (from the past 240 hour(s))  Blood Culture (routine x 2)     Status: None (Preliminary result)   Collection Time: 03/27/18  9:15 PM  Result Value Ref Range Status   Specimen Description LEFT ANTECUBITAL  Final   Special Requests   Final    BOTTLES DRAWN AEROBIC AND ANAEROBIC Blood Culture adequate volume   Culture   Final    NO GROWTH < 12 HOURS Performed at [facility], [address], [place], [place] [address]    Report Status PENDING  Incomplete  Blood Culture (routine x 2)     Status: None (Preliminary result)   Collection Time: 03/27/18  9:15 PM  Result Value Ref Range Status   Specimen Description RIGHT ANTECUBITAL  Final   Special Requests   Final    BOTTLES DRAWN AEROBIC AND ANAEROBIC Blood Culture adequate volume   Culture   Final    NO GROWTH < 12 HOURS Performed at [facility], [address], [place], [place] [address]    Report Status PENDING  Incomplete     Studies: Dg Chest Port 1 View  Result Date: 03/27/2018 CLINICAL DATA:  Fever EXAM: PORTABLE CHEST 1 VIEW COMPARISON:  03/14/2018 chest radiograph. FINDINGS: Left subclavian Port-A-Cath terminates in the middle third of the SVC. Stable cardiomediastinal silhouette with normal heart size. No pneumothorax. No pleural effusion. Lungs appear clear, with no acute consolidative airspace disease and no pulmonary edema. IMPRESSION: No active disease. Electronically Signed   By: Ilona Sorrel M.D.   On: 03/27/2018 21:29     Scheduled Meds: . acetaminophen      . dexamethasone  4 mg Oral Daily  . heparin lock flush      . progesterone  100 mg Oral QHS  . thyroid  90 mg Oral QAC breakfast   Continuous Infusions: . 0.9 % NaCl with KCl 40 mEq / L    . levofloxacin (LEVAQUIN) IV    . metronidazole Stopped (03/28/18 0743)  . [COMPLETED]  potassium PHOSPHATE IVPB (in mmol) 30 mmol (03/28/18 0821)  . sodium chloride      Principal Problem:   Sepsis due to undetermined organism [facility]) Active Problems:   History of thyroid cancer   Gilbert syndrome   Hyperlipidemia   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HCC)   GERD (gastroesophageal reflux disease)   Anxiety   Lactic acidosis   Hypokalemia   Antineoplastic chemotherapy induced pancytopenia (HCC)   Hyperglycemia   Abnormal EKG   Severe sepsis ([place])   Time spent:   Irwin Brakeman, MD, FAAFP [facility]  Pager [phone]  If 7PM-7AM, please contact night-coverage [email or web address] Password [email or web address] 03/28/2018, 12:47 PM    LOS: 1 day

## 2018-03-28 NOTE — ED Notes (Signed)
Dr. Olevia Bowens contacted and stated to cancel I-stat lactic acid.

## 2018-03-28 NOTE — ED Notes (Signed)
Date and time results received: 03/28/18 0458 (use smartphrase ".now" to insert current time)  Test: Lactic Acid Critical Value: 2.7  Name of Provider Notified: Dr Olevia Bowens  Orders Received? Or Actions Taken?:

## 2018-03-28 NOTE — ED Notes (Signed)
Dr Wynetta Emery aware of pt temp.

## 2018-03-28 NOTE — ED Notes (Addendum)
Patient's on call oncologist contacted and stated that patient's chemo pump could be disconnected and her port-a-cath could be de-accessed. Chemo pump turned off by the patient. RN de-accessed . Port-a-cath de-accessed per on call oncologist.

## 2018-03-29 ENCOUNTER — Encounter (HOSPITAL_COMMUNITY): Payer: 59

## 2018-03-29 ENCOUNTER — Inpatient Hospital Stay (HOSPITAL_COMMUNITY): Payer: 59

## 2018-03-29 ENCOUNTER — Ambulatory Visit (HOSPITAL_COMMUNITY): Payer: 59

## 2018-03-29 ENCOUNTER — Other Ambulatory Visit (HOSPITAL_COMMUNITY): Payer: 59

## 2018-03-29 ENCOUNTER — Ambulatory Visit (HOSPITAL_COMMUNITY): Payer: 59 | Admitting: Hematology

## 2018-03-29 DIAGNOSIS — R6520 Severe sepsis without septic shock: Secondary | ICD-10-CM

## 2018-03-29 DIAGNOSIS — E872 Acidosis: Secondary | ICD-10-CM

## 2018-03-29 DIAGNOSIS — C20 Malignant neoplasm of rectum: Secondary | ICD-10-CM

## 2018-03-29 DIAGNOSIS — Z85850 Personal history of malignant neoplasm of thyroid: Secondary | ICD-10-CM

## 2018-03-29 DIAGNOSIS — R739 Hyperglycemia, unspecified: Secondary | ICD-10-CM

## 2018-03-29 DIAGNOSIS — E785 Hyperlipidemia, unspecified: Secondary | ICD-10-CM

## 2018-03-29 LAB — COMPREHENSIVE METABOLIC PANEL
ALT: 119 U/L — ABNORMAL HIGH (ref 0–44)
AST: 85 U/L — ABNORMAL HIGH (ref 15–41)
Albumin: 3 g/dL — ABNORMAL LOW (ref 3.5–5.0)
Alkaline Phosphatase: 58 U/L (ref 38–126)
Anion gap: 3 — ABNORMAL LOW (ref 5–15)
BUN: 8 mg/dL (ref 6–20)
CHLORIDE: 114 mmol/L — AB (ref 98–111)
CO2: 21 mmol/L — ABNORMAL LOW (ref 22–32)
Calcium: 7.9 mg/dL — ABNORMAL LOW (ref 8.9–10.3)
Creatinine, Ser: 0.65 mg/dL (ref 0.44–1.00)
GFR calc Af Amer: >60 mL/min (ref >60)
Glucose, Bld: 100 mg/dL — ABNORMAL HIGH (ref 70–99)
Potassium: 4.6 mmol/L (ref 3.5–5.1)
Sodium: 138 mmol/L (ref 135–145)
Total Bilirubin: 2.6 mg/dL — ABNORMAL HIGH (ref 0.3–1.2)
Total Protein: 5.4 g/dL — ABNORMAL LOW (ref 6.5–8.1)

## 2018-03-29 LAB — CBC WITH DIFFERENTIAL/PLATELET
Abs Immature Granulocytes: 0.01 10*3/uL (ref 0.00–0.07)
Basophils Absolute: 0 10*3/uL (ref 0.0–0.1)
Basophils Relative: 0 %
Eosinophils Absolute: 0 10*3/uL (ref 0.0–0.5)
Eosinophils Relative: 2 %
HCT: 28.6 % — ABNORMAL LOW (ref 36.0–46.0)
Hemoglobin: 9.2 g/dL — ABNORMAL LOW (ref 12.0–15.0)
Immature Granulocytes: 1 %
Lymphocytes Relative: 24 %
Lymphs Abs: 0.4 10*3/uL — ABNORMAL LOW (ref 0.7–4.0)
MCH: 32.4 pg (ref 26.0–34.0)
MCHC: 32.2 g/dL (ref 30.0–36.0)
MCV: 100.7 fL — ABNORMAL HIGH (ref 80.0–100.0)
Monocytes Absolute: 0.2 10*3/uL (ref 0.1–1.0)
Monocytes Relative: 13 %
NEUTROS PCT: 60 %
Neutro Abs: 1 10*3/uL — ABNORMAL LOW (ref 1.7–7.7)
PLATELETS: 56 10*3/uL — AB (ref 150–400)
RBC: 2.84 MIL/uL — ABNORMAL LOW (ref 3.87–5.11)
RDW: 14 % (ref 11.5–15.5)
WBC: 1.6 10*3/uL — ABNORMAL LOW (ref 4.0–10.5)
nRBC: 0 % (ref 0.0–0.2)

## 2018-03-29 LAB — MAGNESIUM: Magnesium: 2.4 mg/dL (ref 1.7–2.4)

## 2018-03-29 LAB — HIV ANTIBODY (ROUTINE TESTING W REFLEX): HIV Screen 4th Generation wRfx: NONREACTIVE

## 2018-03-29 MED ORDER — LEVOFLOXACIN 750 MG PO TABS: 750.0000 mg | ORAL_TABLET | Freq: Every day | ORAL | 0 refills | Status: AC

## 2018-03-29 NOTE — Discharge Summary (Signed)
Physician Discharge Summary  Samantha Reynolds WIO:[id number] DOB: 1966/03/06 DOA: 03/27/2018  PCP: [name], MD Oncologist: Dr. Delton Coombes  Admit date: 03/27/2018 Discharge date: 03/29/2018  Admitted From: Home  Disposition: Home   Recommendations for Outpatient Follow-up:  1. Follow up with PCP in 1 weeks 2. Follow up with Dr. Delton Coombes next week.  3. Please obtain BMP/CBC in one week  Discharge Condition: STABLE   CODE STATUS: FULL    Brief Hospitalization Summary: Please see all hospital notes, images, labs for full details of the hospitalization. HPI: Samantha Reynolds is a 53 y.o. female with medical history significant of anxiety, thyroid cancer, thyroid lobectomy, Hashimoto's thyroiditis, hypothyroidism, GERD, endometrial polyp and thickening on ultrasound, Gilbert syndrome, hyperlipidemia, rectal carcinoma who underwent chemotherapy earlier today with FOLFOX.  She is also on 5-FU home infusion pump.  She was discharged from the cancer center after the FOLFOX infusion around 1400.  Around 1800 the patient started feeling nauseous and having chills.  She denies sick contacts to her best of her knowledge or travel history.  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  Occasional nonspecific headache, but no sore throat, rhinorrhea or productive cough.  No chest pain, dizziness, palpitations, diaphoresis, PND orthopnea or pitting edema of the lower extremities.  Positive nausea, one episode of mildly loose stools, mild abdominal discomfort, flatulence and bloating sensation, but no emesis, constipation, melena or hematochezia.  She denies dysuria, frequency or hematuria.  ED Course: Initial vital signs temperature 102.8 F, pulse 124, respirations 20, blood pressure 103/70 mmHg and O2 sat 98% on room air.  The patient received 2500 normal saline bolus, Zofran 4 mg IVP x1, K-Lor 40 mEq p.o. x1, acetaminophen 650 mg p.o. x1 dose, cefepime, metronidazole and vancomycin.  She  was premedicated with diphenhydramine 50 mg IVP prior to vancomycin infusion.  I spoke to Dr. Delton Coombes about the patient's condition and will be holding her 5-FU infusion pump for now.  Blood cultures x2 were drawn.  Her urinalysis was normal.  CBC showed a white count of 3.7 with 94% neutrophils, 2% lymphocytes and 4% monocytes.  Hemoglobin was 11.5 g/dL and platelets of 84.  Lactic acid was 2.8 then 2.54 mmol/L.  PT and INR were normal.  CMP shows a potassium of 2.9 and CO2 21 mmol/L.  All other electrolytes anion gap are within normal limits.  Glucose 174 and bilirubin 2.6 mg/dL.  AST 66 and ALT 48 units/L.  All other values are within normal limits.  Influenza a and B by PCR was negative.  Her chest radiograph did not show any acute cardiopulmonary pathology.  Brief Admission Hx: Samantha Reynolds a 53 y.o.femalewith medical history significant ofanxiety, thyroid cancer, thyroid lobectomy, Hashimoto's thyroiditis, hypothyroidism, GERD, endometrial polyp and thickening on ultrasound,Gilbert syndrome, hyperlipidemia, rectal carcinoma who underwent chemotherapy earlier today withFOLFOX. She is also on 5-FU home infusion pump. She was discharged from the cancer center after the FOLFOX infusion around 1400. Around 1800 the patient started feeling nauseous and having chills.  MDM/Assessment & Plan: 1. Severe sepsis - RESOLVED - treated with supportive therapy,  broad spectrum antibiotics : Pt apparently experienced red man reaction from vancomycin and was discontinued, I discussed with pharmacy team and will Rx levofloxacin to cover pseudomonas. 2. RLL Pneumonia - noted on repeat CXR this morning after hydration, Pt feels much better today after levofloxacin and will discharge home on levofloxacin to complete course.   3. Lactic acidosis - RESOLVED.  treated with aggressive fluid  replacement.  4.  Hypokalemia - repleted.  5. Antineoplastic chemotherapy induced pancytopenia - follow  closely, neutropenic precautions.  6. Hyperglycemia from steroids - stable. Improved with IV fluids.  7. Hypothyroidism - continue home armour thyroid 8. HLE - had not been on medical therapy due to [facility] syndrome.   9. Rectal cancer - per oncologist Dr. Delton Coombes, hold 5-FU infusion pump for now.  Follow up with oncology.  10. GERD - protonix ordered.  11. GAD - resume home alprazolam as needed.  12. Hypotension - RESOLVED.    DVT prophylaxis: SCDS Code Status: FULL  Family Communication: husband  Disposition Plan: Home  Antimicrobials:  Vancomycin 12/31  Metronidazole 12/31  Cefepime 12/31  Levofloxacin 1/1>1/2  Discharge Diagnoses:  Principal Problem:   Sepsis due to undetermined organism [facility]) Active Problems:   History of thyroid cancer   Gilbert syndrome   Hyperlipidemia   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HCC)   GERD (gastroesophageal reflux disease)   Anxiety   Lactic acidosis   Hypokalemia   Antineoplastic chemotherapy induced pancytopenia (HCC)   Hyperglycemia   Abnormal EKG   Severe sepsis [facility])    Discharge Instructions: Discharge Instructions    Call MD for:  difficulty breathing, headache or visual disturbances   Complete by:  As directed    Call MD for:  persistant dizziness or light-headedness   Complete by:  As directed    Call MD for:  persistant nausea and vomiting   Complete by:  As directed    Call MD for:  severe uncontrolled pain   Complete by:  As directed    Diet - low sodium heart healthy   Complete by:  As directed    Increase activity slowly   Complete by:  As directed       Follow-up Information    Derek Jack, MD. Schedule an appointment as soon as possible for a visit on 04/11/2018.   Specialty:  Hematology Why:  Hospital Follow Up as scheduled Contact information: [place] [address] [phone]            Procedures/Studies: Mr Liver W Wo Contrast  Result Date:  03/08/2018 CLINICAL DATA:  Rectal cancer. Hypermetabolic lesion in the central right liver on PET-CT. EXAM: MRI ABDOMEN WITHOUT AND WITH CONTRAST TECHNIQUE: Multiplanar multisequence MR imaging of the abdomen was performed both before and after the administration of intravenous contrast. CONTRAST:  7.5 cc Gadavist COMPARISON:  PET-CT 03/05/2018.  CT abdomen and pelvis 02/21/2018. FINDINGS: Lower chest: Unremarkable. Hepatobiliary: 1.4 cm T1 hypointense, T2 intermediate intensity lesion is identified in the posterior right liver (segment VI). This lesion restricts diffusion and shows circumferential low level peripheral enhancement. Delayed post-contrast imaging shows altered perfusion peripheral to the lesion. A 5 mm focus of hyperenhancement is identified in the lateral segment left liver on the 5 minutes postcontrast T1 gradient imaging (image 26/series 22). No definite signal abnormality identified at this location on precontrast imaging. Gallbladder surgically absent. No intrahepatic or extrahepatic biliary dilation. Pancreas: No focal mass lesion. No dilatation of the main duct. No intraparenchymal cyst. No peripancreatic edema. Spleen: 2.3 cm septated cystic lesion identified anterior spleen. No irregular septal thickening or gross mural nodularity. Adrenals/Urinary Tract: No adrenal nodule or mass. Tiny cysts noted interpolar right kidney. Left kidney unremarkable. Stomach/Bowel: Stomach is nondistended. No gastric wall thickening. No evidence of outlet obstruction. Duodenum is normally positioned as is the ligament of Treitz. No small bowel wall thickening. No small bowel or colonic dilatation  within the visualized abdomen. Vascular/Lymphatic: No abdominal aortic aneurysm. There is no gastrohepatic or hepatoduodenal ligament lymphadenopathy. No intraperitoneal or retroperitoneal lymphadenopathy. Other:  No intraperitoneal free fluid. Musculoskeletal: No abnormal marrow enhancement within the visualized bony  anatomy. IMPRESSION: 1. 1.4 cm lesion in the posterior right hepatic lobe restricts diffusion and shows relatively diffuse delayed enhancement. Given the hypermetabolism seen in this lesion on PET-CT, the finding remains highly concerning for metastatic disease. 2. 5 mm focus of delayed hyperenhancement in the lateral segment left liver, nonspecific, but as a second metastatic deposit is not be excluded, close attention will be required. 3. Complex cyst anterior spleen. 4. Tiny simple cyst interpolar right kidney. Electronically Signed   By: Misty Stanley M.D.   On: 03/08/2018 10:28   Nm Pet Image Initial (pi) Skull Base To Thigh  Result Date: 03/05/2018 CLINICAL DATA:  Initial treatment strategy for rectal cancer. EXAM: NUCLEAR MEDICINE PET SKULL BASE TO THIGH TECHNIQUE: 9.41 mCi F-18 FDG was injected intravenously. Full-ring PET imaging was performed from the skull base to thigh after the radiotracer. CT data was obtained and used for attenuation correction and anatomic localization. Fasting blood glucose: 82 mg/dl COMPARISON:  CT abdomen/pelvis 02/21/2018 and pelvic MRI 02/21/2018 FINDINGS: Mediastinal blood pool activity: SUV max 2.65 NECK: No hypermetabolic lymph nodes in the neck. Incidental CT findings: none CHEST: No enlarged or hypermetabolic mediastinal or hilar lymph nodes. 7 mm left basilar pulmonary nodule also seen on the prior chest CT is very slightly hypermetabolic with SUV max of 4.09. This is worrisome for a metastatic focus. Incidental CT findings: none ABDOMEN/PELVIS: There is a hypermetabolic liver lesion in segment 6 consistent with a metastasis. It is difficult to see on the noncontrast CT scan but is evident on a prior CT scan of the abdomen. No other liver lesions are identified. The splenic lesion is not hypermetabolic. The rectal mass is hypermetabolic with SUV max of 81.19. 9 mm left perirectal lymph node is hypermetabolic with SUV max of 1.47. There are also small nodes in the  sigmoid mesocolon matter mildly hypermetabolic. The more caudal node on image number 221 measures 9 mm and the SUV max is 3.37. The slightly more cephalad node measures 8 mm in the SUV max is 3.64. No retroperitoneal adenopathy. Incidental CT findings: none SKELETON: No focal hypermetabolic activity to suggest skeletal metastasis. Incidental CT findings: none IMPRESSION: 1. Hypermetabolic rectal mass consistent with known neoplasm. 2. Small but hypermetabolic left perirectal and sigmoid mesocolon lymph nodes. 3. Single hepatic metastatic lesion in the right hepatic lobe. 4. 7 mm pulmonary nodule at the left lung base is suspicious for metastasis. Electronically Signed   By: Marijo Sanes M.D.   On: 03/05/2018 16:22   Ir US Guide Bx Asp/drain  Result Date: 03/09/2018 INDICATION: History of colorectal cancer, now with indeterminate hypermetabolic liver lesion worrisome for metastatic disease. Please perform ultrasound-guided liver lesion biopsy for tissue diagnostic purposes. EXAM: ULTRASOUND GUIDED LIVER LESION BIOPSY COMPARISON:  Abdominal MRI-03/08/2018; PET CT-03/05/2018 MEDICATIONS: None ANESTHESIA/SEDATION: Fentanyl 100 mcg IV; Versed 2 mg IV Total Moderate Sedation time:  20 Minutes. The patient's level of consciousness and vital signs were monitored continuously by radiology nursing throughout the procedure under my direct supervision. COMPLICATIONS: None immediate. PROCEDURE: Informed written consent was obtained from the patient after a discussion of the risks, benefits and alternatives to treatment. The patient understands and consents the procedure. A timeout was performed prior to the initiation of the procedure. Ultrasound scanning was performed of the right  upper abdominal quadrant demonstrates a slightly ill-defined mixed echogenic approximately 1.7 x 1.3 cm nodule within the caudal aspect the right lobe of the liver correlating with the indeterminate lesion seen on preceding abdominal MRI image  15, series 5. The procedure was planned. The right upper abdominal quadrant was prepped and draped in the usual sterile fashion. The overlying soft tissues were anesthetized with 1% lidocaine with epinephrine. A 17 gauge, 6.8 cm co-axial needle was advanced into a peripheral aspect of the lesion. This was followed by 4 core biopsies with an 18 gauge core device under direct ultrasound guidance. Multiple ultrasound images were saved for procedural documentation purposes. The coaxial needle tract was embolized with a small amount of Gel-Foam slurry and superficial hemostasis was obtained with manual compression. Post procedural scanning was negative for definitive area of hemorrhage or additional complication. A dressing was placed. The patient tolerated the procedure well without immediate post procedural complication. IMPRESSION: Technically successful ultrasound guided core needle biopsy of indeterminate lesion within the caudal aspect of the right lobe liver. Electronically Signed   By: Sandi Mariscal M.D.   On: 03/09/2018 10:03   Dg Chest Port 1 View  Result Date: 03/29/2018 CLINICAL DATA:  Nausea, chills, fever, sepsis. History of thyroid and rectal malignancy. Smoker. EXAM: PORTABLE CHEST 1 VIEW COMPARISON:  Portable chest x-ray of March 27, 2018. FINDINGS: The lungs are well-expanded. There is subtle increased density obscuring the right lateral costophrenic angle which is new. In the adjacent right lung base the interstitial markings are more conspicuous as well. Left lung is clear. The heart and mediastinal structures are normal. The power port catheter tip projects over the midportion of the SVC. IMPRESSION: Subsegmental atelectasis or developing pneumonia at the right lung base. Trace right pleural effusion. These findings are new since the study of 2 days ago. Electronically Signed   By: David  Martinique M.D.   On: 03/29/2018 07:36   Dg Chest Port 1 View  Result Date: 03/27/2018 CLINICAL DATA:  Fever  EXAM: PORTABLE CHEST 1 VIEW COMPARISON:  03/14/2018 chest radiograph. FINDINGS: Left subclavian Port-A-Cath terminates in the middle third of the SVC. Stable cardiomediastinal silhouette with normal heart size. No pneumothorax. No pleural effusion. Lungs appear clear, with no acute consolidative airspace disease and no pulmonary edema. IMPRESSION: No active disease. Electronically Signed   By: Ilona Sorrel M.D.   On: 03/27/2018 21:29   Dg Chest Port 1 View  Result Date: 03/14/2018 CLINICAL DATA:  Status post port placement EXAM: PORTABLE CHEST 1 VIEW COMPARISON:  None. FINDINGS: Cardiac shadow is within normal limits. Left chest wall port is noted with the catheter tip in the mid superior vena cava. No pneumothorax is seen. No focal infiltrate or effusion is noted. No bony abnormality is seen. IMPRESSION: No pneumothorax following port placement. Electronically Signed   By: Inez Catalina M.D.   On: 03/14/2018 10:45   Dg C-arm 1-60 Min-no Report  Result Date: 03/14/2018 Fluoroscopy was utilized by the requesting physician.  No radiographic interpretation.   Mm 3d Screen Breast Bilateral  Result Date: 03/02/2018 CLINICAL DATA:  Screening. EXAM: DIGITAL SCREENING BILATERAL MAMMOGRAM WITH TOMO AND CAD COMPARISON:  Previous exam(s). ACR Breast Density Category c: The breast tissue is heterogeneously dense, which may obscure small masses. FINDINGS: There are no findings suspicious for malignancy. Images were processed with CAD. IMPRESSION: No mammographic evidence of malignancy. A result letter of this screening mammogram will be mailed directly to the patient. RECOMMENDATION: Screening mammogram in one year. (  Code:[id number]-B-01Y) BI-RADS CATEGORY  1: Negative. Electronically Signed   By: Curlene Dolphin M.D.   On: 03/02/2018 16:39      Subjective: Pt says she feels much better today and would like to go  Home. She is off oxygen and ambulating and eating and drinking much better.   Discharge Exam: Vitals:    03/29/18 0621 03/29/18 1355  BP: 108/75 104/73  Pulse: 69 84  Resp: 16 17  Temp: 98.4 F (36.9 C) 98.2 F (36.8 C)  SpO2: 100% 100%   Vitals:   03/28/18 1544 03/28/18 2242 03/29/18 0621 03/29/18 1355  BP: 108/70 97/67 108/75 104/73  Pulse: 72 71 69 84  Resp: 16 18 16 17   Temp:  98.7 F (37.1 C) 98.4 F (36.9 C) 98.2 F (36.8 C)  TempSrc:  Oral Oral Oral  SpO2: 99% 99% 100% 100%  Weight:      Height:        General: Pt is alert, awake, not in acute distress Cardiovascular: RRR, S1/S2 +, no rubs, no gallops Respiratory: CTA bilaterally, no wheezing, no rhonchi Abdominal: Soft, NT, ND, bowel sounds + Extremities: no edema, no cyanosis   The results of significant diagnostics from this hospitalization (including imaging, microbiology, ancillary and laboratory) are listed below for reference.     Microbiology: Recent Results (from the past 240 hour(s))  Blood Culture (routine x 2)     Status: None (Preliminary result)   Collection Time: 03/27/18  9:15 PM  Result Value Ref Range Status   Specimen Description LEFT ANTECUBITAL  Final   Special Requests   Final    BOTTLES DRAWN AEROBIC AND ANAEROBIC Blood Culture adequate volume   Culture   Final    NO GROWTH 2 DAYS Performed at [facility], [address], [place], [place] [address]    Report Status PENDING  Incomplete  Blood Culture (routine x 2)     Status: None (Preliminary result)   Collection Time: 03/27/18  9:15 PM  Result Value Ref Range Status   Specimen Description RIGHT ANTECUBITAL  Final   Special Requests   Final    BOTTLES DRAWN AEROBIC AND ANAEROBIC Blood Culture adequate volume   Culture   Final    NO GROWTH 2 DAYS Performed at [facility], [address], [place], [place] [address]    Report Status PENDING  Incomplete     Labs: BNP (last 3 results) No results for input(s): BNP in the last 8760 hours. Basic Metabolic Panel: Recent Labs  Lab 03/27/18 0929 03/27/18 2103 03/27/18 2115  03/28/18 0303 03/29/18 0447  NA 140 138  --  140 138  K 3.1* 2.9*  --  3.7 4.6  CL 109 108  --  117* 114*  CO2 26 21*  --  18* 21*  GLUCOSE 107* 174*  --  144* 100*  BUN 9 10  --  10 8  CREATININE 0.92 0.81  --  0.72 0.65  CALCIUM 8.8* 9.0  --  7.8* 7.9*  MG  --   --  2.0  --  2.4  PHOS  --   --  1.0*  --   --    Liver Function Tests: Recent Labs  Lab 03/27/18 0929 03/27/18 2103 03/28/18 0303 03/29/18 0447  AST 32 66* 167* 85*  ALT 29 48* 97* 119*  ALKPHOS 55 65 50 58  BILITOT 2.6* 2.6* 2.5* 2.6*  PROT 6.9 7.1 5.0* 5.4*  ALBUMIN 4.1 4.3 3.0* 3.0*   No results  for input(s): LIPASE, AMYLASE in the last 168 hours. No results for input(s): AMMONIA in the last 168 hours. CBC: Recent Labs  Lab 03/27/18 0929 03/27/18 2115 03/28/18 0303 03/29/18 0447  WBC 3.1* 3.7* 1.8* 1.6*  NEUTROABS 1.7 3.5 1.6* 1.0*  HGB 12.3 11.5* 8.7* 9.2*  HCT 36.6 34.4* 26.6* 28.6*  MCV 95.1 95.0 97.4 100.7*  PLT 125* 84* 59* 56*   Cardiac Enzymes: Recent Labs  Lab 03/28/18 0303 03/28/18 0908  TROPONINI <0.03 <0.03   BNP: Invalid input(s): POCBNP CBG: No results for input(s): GLUCAP in the last 168 hours. D-Dimer No results for input(s): DDIMER in the last 72 hours. Hgb A1c No results for input(s): HGBA1C in the last 72 hours. Lipid Profile No results for input(s): CHOL, HDL, LDLCALC, TRIG, CHOLHDL, LDLDIRECT in the last 72 hours. Thyroid function studies No results for input(s): TSH, T4TOTAL, T3FREE, THYROIDAB in the last 72 hours.  Invalid input(s): FREET3 Anemia work up No results for input(s): VITAMINB12, FOLATE, FERRITIN, TIBC, IRON, RETICCTPCT in the last 72 hours. Urinalysis    Component Value Date/Time   COLORURINE YELLOW 03/27/2018 2355   APPEARANCEUR CLEAR 03/27/2018 2355   LABSPEC 1.014 03/27/2018 2355   PHURINE 5.0 03/27/2018 2355   GLUCOSEU NEGATIVE 03/27/2018 2355   HGBUR NEGATIVE 03/27/2018 2355   BILIRUBINUR NEGATIVE 03/27/2018 2355   KETONESUR NEGATIVE  03/27/2018 2355   PROTEINUR NEGATIVE 03/27/2018 2355   NITRITE NEGATIVE 03/27/2018 2355   LEUKOCYTESUR NEGATIVE 03/27/2018 2355   Sepsis Labs Invalid input(s): PROCALCITONIN,  WBC,  LACTICIDVEN Microbiology Recent Results (from the past 240 hour(s))  Blood Culture (routine x 2)     Status: None (Preliminary result)   Collection Time: 03/27/18  9:15 PM  Result Value Ref Range Status   Specimen Description LEFT ANTECUBITAL  Final   Special Requests   Final    BOTTLES DRAWN AEROBIC AND ANAEROBIC Blood Culture adequate volume   Culture   Final    NO GROWTH 2 DAYS Performed at [facility], [address] [place], [place] [address]    Report Status PENDING  Incomplete  Blood Culture (routine x 2)     Status: None (Preliminary result)   Collection Time: 03/27/18  9:15 PM  Result Value Ref Range Status   Specimen Description RIGHT ANTECUBITAL  Final   Special Requests   Final    BOTTLES DRAWN AEROBIC AND ANAEROBIC Blood Culture adequate volume   Culture   Final    NO GROWTH 2 DAYS Performed at [facility], [address] [place], [place] [address]    Report Status PENDING  Incomplete    Time coordinating discharge: 34 mins.   SIGNED:  Irwin Brakeman, MD  [facility] 03/29/2018, 3:19 PM Pager [phone]  If 7PM-7AM, please contact night-coverage [email or web address] Password [email or web address]

## 2018-03-29 NOTE — Plan of Care (Signed)

## 2018-03-29 NOTE — Discharge Instructions (Signed)
Sepsis, Diagnosis, Adult °Sepsis is a serious bodily reaction to an infection. The infection that triggers sepsis may be from a bacteria, virus, or fungus. Sepsis can result from an infection in any part of your body. Infections that commonly lead to sepsis include skin, lung, and urinary tract infections. °Sepsis is a medical emergency that must be treated right away in a hospital. In severe cases, it can lead to septic shock. Septic shock can weaken your heart and cause your blood pressure to drop. This can cause your central nervous system and your body's organs to stop working. °What are the causes? °This condition is caused by a severe reaction to infections from bacteria, viruses, or fungus. The germs that most often lead to sepsis include: °· Escherichia coli (E. coli) bacteria. °· Staphylococcus aureus (staph) bacteria. °· Some types of Streptococcus bacteria. °The most common infections affect these organs: °· The lung (pneumonia). °· The kidneys or bladder (urinary tract infection). °· The skin (cellulitis). °· The bowel, gallbladder, or pancreas. °What increases the risk? °You are more likely to develop this condition if: °· Your body's disease-fighting system (immune system) is weakened. °· You are age 65 or older. °· You are female. °· You had surgery or you have been hospitalized. °· You have these devices inserted into your body: °? A small, thin tube (catheter). °? IV line. °? Breathing tube. °? Drainage tube. °· You are not getting enough nutrients from food (malnourished). °· You have a long-term (chronic) disease, such as cancer, lung disease, kidney disease, or diabetes. °· You are African American. °What are the signs or symptoms? °Symptoms of this condition may include: °· Fever. °· Chills or feeling very cold. °· Confusion or anxiety. °· Fatigue. °· Muscle aches. °· Shortness of breath. °· Nausea and vomiting. °· Urinating much less than usual. °· Fast heart rate (tachycardia). °· Rapid  breathing (hyperventilation). °· Changes in skin color. Your skin may look blotchy, pale, or blue. °· Cool, clammy, or sweaty skin. °· Skin rash. °Other symptoms depend on the source of your infection. °How is this diagnosed? °This condition is diagnosed based on: °· Your symptoms. °· Your medical history. °· A physical exam. °Other tests may also be done to find out the cause of the infection and how severe the sepsis is. These tests may include: °· Blood tests. °· Urine tests. °· Swabs from other areas of your body that may have an infection. These samples may be tested (cultured) to find out what type of bacteria is causing the infection. °· Chest X-ray to check for pneumonia. Other imaging tests, such as a CT scan, may also be done. °· Lumbar puncture. This removes a small amount of the fluid that surrounds your brain and spinal cord. The fluid is then examined for infection. °How is this treated? °This condition must be treated in a hospital. Based on the cause of your infection, you may be given an antibiotic, antiviral, or antifungal medicine. °You may also receive: °· Fluids through an IV. °· Oxygen and breathing assistance. °· Medicines to increase your blood pressure. °· Kidney dialysis. This process cleans your blood if your kidneys have failed. °· Surgery to remove infected tissue. °· Blood transfusion if needed. °· Medicine to prevent blood clots. °· Nutrients to correct imbalances in basic body function (metabolism). You may: °? Receive important salts and minerals (electrolytes) through an IV. °? Have your blood sugar level adjusted. °Follow these instructions at home: °Medicines ° °· Take over-the-counter and   prescription medicines only as told by your health care provider.  If you were prescribed an antibiotic, antiviral, or antifungal medicine, take it as told by your health care provider. Do not stop taking the medicine even if you start to feel better. General instructions  If you have a  catheter or other indwelling device, ask to have it removed as soon as possible.  Keep all follow-up visits as told by your health care provider. This is important. Contact a health care provider if:  You do not feel like you are getting better or regaining strength.  You are having trouble coping with your recovery.  You frequently feel tired.  You feel worse or do not seem to get better after surgery.  You think you may have an infection after surgery. Get help right away if:  You have any symptoms of sepsis.  You have difficulty breathing.  You have a rapid or skipping heartbeat.  You become confused or disoriented.  You have a high fever.  Your skin becomes blotchy, pale, or blue.  You have an infection that is getting worse or not getting better. These symptoms may represent a serious problem that is an emergency. Do not wait to see if the symptoms will go away. Get medical help right away. Call your local emergency services (911 in the [place] Do not drive yourself to the hospital. Summary  Sepsis is a medical emergency that requires immediate treatment in a hospital.  This condition is caused by a severe reaction to infections from bacteria, viruses, or fungus.  Based on the cause of your infection, you may be given an antibiotic, antiviral, or antifungal medicine.  Treatment may also include IV fluids, breathing assistance, and kidney dialysis. This information is not intended to replace advice given to you by your health care provider. Make sure you discuss any questions you have with your health care provider. Document Released: 12/11/2002 Document Revised: 10/20/2017 Document Reviewed: 10/20/2017 Elsevier Interactive Patient Education  2019 [place] Pneumonia, Adult Pneumonia is an infection of the lungs. There are different types of pneumonia. One type can develop while a person is in a hospital. A different type, called  community-acquired pneumonia, develops in people who are not, or have not recently been, in the hospital or other health care facility. What are the causes?  Pneumonia may be caused by bacteria, viruses, or funguses. Community-acquired pneumonia is often caused by Streptococcus pneumonia bacteria. These bacteria are often passed from one person to another by breathing in droplets from the cough or sneeze of an infected person. What increases the risk? The condition is more likely to develop in:  People who havechronic diseases, such as chronic obstructive pulmonary disease (COPD), asthma, congestive heart failure, cystic fibrosis, diabetes, or kidney disease.  People who haveearly-stage or late-stage HIV.  People who havesickle cell disease.  People who havehad their spleen removed (splenectomy).  People who havepoor Human resources officer.  People who havemedical conditions that increase the risk of breathing in (aspirating) secretions their own mouth and nose.  People who havea weakened immune system (immunocompromised).  People who smoke.  People whotravel to areas where pneumonia-causing germs commonly exist.  People whoare around animal habitats or animals that have pneumonia-causing germs, including birds, bats, rabbits, cats, and farm animals. What are the signs or symptoms? Symptoms of this condition include:  Adry cough.  A wet (productive) cough.  Fever.  Sweating.  Chest pain, especially when breathing deeply or coughing.  Rapid breathing  or difficulty breathing.  Shortness of breath.  Shaking chills.  Fatigue.  Muscle aches. How is this diagnosed? Your health care provider will take a medical history and perform a physical exam. You may also have other tests, including:  Imaging studies of your chest, including X-rays.  Tests to check your blood oxygen level and other blood gases.  Other tests on blood, mucus (sputum), fluid around your lungs  (pleural fluid), and urine. If your pneumonia is severe, other tests may be done to identify the specific cause of your illness. How is this treated? The type of treatment that you receive depends on many factors, such as the cause of your pneumonia, the medicines you take, and other medical conditions that you have. For most adults, treatment and recovery from pneumonia may occur at home. In some cases, treatment must happen in a hospital. Treatment may include:  Antibiotic medicines, if the pneumonia was caused by bacteria.  Antiviral medicines, if the pneumonia was caused by a virus.  Medicines that are given by mouth or through an IV tube.  Oxygen.  Respiratory therapy. Although rare, treating severe pneumonia may include:  Mechanical ventilation. This is done if you are not breathing well on your own and you cannot maintain a safe blood oxygen level.  Thoracentesis. This procedureremoves fluid around one lung or both lungs to help you breathe better. Follow these instructions at home:   Take over-the-counter and prescription medicines only as told by your health care provider. ? Only takecough medicine if you are losing sleep. Understand that cough medicine can prevent your bodys natural ability to remove mucus from your lungs. ? If you were prescribed an antibiotic medicine, take it as told by your health care provider. Do not stop taking the antibiotic even if you start to feel better.  Sleep in a semi-upright position at night. Try sleeping in a reclining chair, or place a few pillows under your head.  Do not use tobacco products, including cigarettes, chewing tobacco, and e-cigarettes. If you need help quitting, ask your health care provider.  Drink enough water to keep your urine clear or pale yellow. This will help to thin out mucus secretions in your lungs. How is this prevented? There are ways that you can decrease your risk of developing community-acquired pneumonia.  Consider getting a pneumococcal vaccine if:  You are older than 55 years of age.  You are older than 55 years of age and are undergoing cancer treatment, have chronic lung disease, or have other medical conditions that affect your immune system. Ask your health care provider if this applies to you. There are different types and schedules of pneumococcal vaccines. Ask your health care provider which vaccination option is best for you. You may also prevent community-acquired pneumonia if you take these actions:  Get an influenza vaccine every year. Ask your health care provider which type of influenza vaccine is best for you.  Go to the dentist on a regular basis.  Wash your hands often. Use hand sanitizer if soap and water are not available. Contact a health care provider if:  You have a fever.  You are losing sleep because you cannot control your cough with cough medicine. Get help right away if:  You have worsening shortness of breath.  You have increased chest pain.  Your sickness becomes worse, especially if you are an older adult or have a weakened immune system.  You cough up blood. This information is not intended to replace advice  given to you by your health care provider. Make sure you discuss any questions you have with your health care provider. Document Released: 03/14/2005 Document Revised: 12/01/2016 Document Reviewed: 07/09/2014 Elsevier Interactive Patient Education  2019 [place].   Follow with Primary MD  [name], MD  and other consultant's as instructed your Hospitalist MD  Please get a complete blood count and chemistry panel checked by your Primary MD at your next visit, and again as instructed by your Primary MD.  Get Medicines reviewed and adjusted: Please take all your medications with you for your next visit with your Primary MD  Laboratory/radiological data: Please request your Primary MD to go over all hospital tests and  procedure/radiological results at the follow up, please ask your Primary MD to get all Hospital records sent to his/her office.  In some cases, they will be blood work, cultures and biopsy results pending at the time of your discharge. Please request that your primary care M.D. follows up on these results.  Also Note the following: If you experience worsening of your admission symptoms, develop shortness of breath, life threatening emergency, suicidal or homicidal thoughts you must seek medical attention immediately by calling 911 or calling your MD immediately  if symptoms less severe.  You must read complete instructions/literature along with all the possible adverse reactions/side effects for all the Medicines you take and that have been prescribed to you. Take any new Medicines after you have completely understood and accpet all the possible adverse reactions/side effects.   Do not drive when taking Pain medications or sleeping medications (Benzodaizepines)  Do not take more than prescribed Pain, Sleep and Anxiety Medications. It is not advisable to combine anxiety,sleep and pain medications without talking with your primary care practitioner  Special Instructions: If you have smoked or chewed Tobacco  in the last 2 yrs please stop smoking, stop any regular Alcohol  and or any Recreational drug use.  Wear Seat belts while driving.  Please note: You were cared for by a hospitalist during your hospital stay. Once you are discharged, your primary care physician will handle any further medical issues. Please note that NO REFILLS for any discharge medications will be authorized once you are discharged, as it is imperative that you return to your primary care physician (or establish a relationship with a primary care physician if you do not have one) for your post hospital discharge needs so that they can reassess your need for medications and monitor your lab values.

## 2018-03-29 NOTE — Plan of Care (Signed)
  Problem: Education: Goal: Knowledge of General Education information will improve Description Including pain rating scale, medication(s)/side effects and non-pharmacologic comfort measures 03/29/2018 1538 by Rance Muir, RN Outcome: Adequate for Discharge 03/29/2018 1024 by Rance Muir, RN Outcome: Progressing   Problem: Health Behavior/Discharge Planning: Goal: Ability to manage health-related needs will improve 03/29/2018 1538 by Rance Muir, RN Outcome: Adequate for Discharge 03/29/2018 1024 by Rance Muir, RN Outcome: Progressing   Problem: Clinical Measurements: Goal: Ability to maintain clinical measurements within normal limits will improve 03/29/2018 1538 by Rance Muir, RN Outcome: Adequate for Discharge 03/29/2018 1024 by Rance Muir, RN Outcome: Progressing Goal: Will remain free from infection 03/29/2018 1538 by Rance Muir, RN Outcome: Adequate for Discharge 03/29/2018 1024 by Rance Muir, RN Outcome: Progressing Goal: Diagnostic test results will improve 03/29/2018 1538 by Rance Muir, RN Outcome: Adequate for Discharge 03/29/2018 1024 by Rance Muir, RN Outcome: Progressing Goal: Respiratory complications will improve 03/29/2018 1538 by Rance Muir, RN Outcome: Adequate for Discharge 03/29/2018 1024 by Rance Muir, RN Outcome: Progressing Goal: Cardiovascular complication will be avoided 03/29/2018 1538 by Rance Muir, RN Outcome: Adequate for Discharge 03/29/2018 1024 by Rance Muir, RN Outcome: Progressing   Problem: Activity: Goal: Risk for activity intolerance will decrease 03/29/2018 1538 by Rance Muir, RN Outcome: Adequate for Discharge 03/29/2018 1024 by Rance Muir, RN Outcome: Progressing   Problem: Nutrition: Goal: Adequate nutrition will be maintained 03/29/2018 1538 by Rance Muir, RN Outcome: Adequate for Discharge 03/29/2018 1024 by Rance Muir, RN Outcome: Progressing   Problem: Coping: Goal: Level of anxiety will decrease 03/29/2018 1538 by Rance Muir, RN Outcome: Adequate for Discharge 03/29/2018  1024 by Rance Muir, RN Outcome: Progressing   Problem: Elimination: Goal: Will not experience complications related to bowel motility 03/29/2018 1538 by Rance Muir, RN Outcome: Adequate for Discharge 03/29/2018 1024 by Rance Muir, RN Outcome: Progressing Goal: Will not experience complications related to urinary retention 03/29/2018 1538 by Rance Muir, RN Outcome: Adequate for Discharge 03/29/2018 1024 by Rance Muir, RN Outcome: Progressing   Problem: Pain Managment: Goal: General experience of comfort will improve 03/29/2018 1538 by Rance Muir, RN Outcome: Adequate for Discharge 03/29/2018 1024 by Rance Muir, RN Outcome: Progressing   Problem: Safety: Goal: Ability to remain free from injury will improve 03/29/2018 1538 by Rance Muir, RN Outcome: Adequate for Discharge 03/29/2018 1024 by Rance Muir, RN Outcome: Progressing   Problem: Skin Integrity: Goal: Risk for impaired skin integrity will decrease 03/29/2018 1538 by Rance Muir, RN Outcome: Adequate for Discharge 03/29/2018 1024 by Rance Muir, RN Outcome: Progressing

## 2018-03-31 ENCOUNTER — Encounter (HOSPITAL_COMMUNITY): Payer: 59

## 2018-04-01 LAB — CULTURE, BLOOD (ROUTINE X 2)
Culture: NO GROWTH
Culture: NO GROWTH
Special Requests: ADEQUATE
Special Requests: ADEQUATE

## 2018-04-01 NOTE — ED Notes (Signed)
Patient given 500 mg of acetaminophen PRN for headache.

## 2018-04-01 NOTE — ED Notes (Signed)
Hospital bed brought to patient due to patient holding. Patient had bedside toilet at bedside. Patient had hourly rounding throughout the shift.

## 2018-04-01 NOTE — ED Notes (Signed)
Dr. Olevia Bowens contacted about patient getting potassium. Patient started having chest pain after the 1st run of potassium. A re-peat EKG was completed. DR. Olevia Bowens stated to discontinue the other 2 ordered runs of potassium.

## 2018-04-01 NOTE — ED Notes (Signed)
Date and time results received: 03/27/18 23:59 (use smartphrase ".now" to insert current time)  Test: I-STAT LACTIC Critical Value: 2.54  Name of Provider Notified: Dr. Olevia Bowens notified  Orders Received? Or Actions Taken?: Provider notified

## 2018-04-01 NOTE — ED Notes (Signed)
Patient asking for Zofran due to nausea.

## 2018-04-02 ENCOUNTER — Other Ambulatory Visit (HOSPITAL_COMMUNITY): Payer: Self-pay | Admitting: *Deleted

## 2018-04-02 ENCOUNTER — Other Ambulatory Visit: Payer: Self-pay | Admitting: *Deleted

## 2018-04-02 ENCOUNTER — Inpatient Hospital Stay (HOSPITAL_COMMUNITY): Payer: 59 | Attending: Hematology

## 2018-04-02 DIAGNOSIS — Z5111 Encounter for antineoplastic chemotherapy: Secondary | ICD-10-CM | POA: Insufficient documentation

## 2018-04-02 DIAGNOSIS — K5900 Constipation, unspecified: Secondary | ICD-10-CM | POA: Insufficient documentation

## 2018-04-02 DIAGNOSIS — Z79899 Other long term (current) drug therapy: Secondary | ICD-10-CM | POA: Insufficient documentation

## 2018-04-02 DIAGNOSIS — R110 Nausea: Secondary | ICD-10-CM | POA: Insufficient documentation

## 2018-04-02 DIAGNOSIS — D696 Thrombocytopenia, unspecified: Secondary | ICD-10-CM | POA: Diagnosis not present

## 2018-04-02 DIAGNOSIS — C787 Secondary malignant neoplasm of liver and intrahepatic bile duct: Secondary | ICD-10-CM | POA: Insufficient documentation

## 2018-04-02 DIAGNOSIS — Z85850 Personal history of malignant neoplasm of thyroid: Secondary | ICD-10-CM | POA: Diagnosis not present

## 2018-04-02 DIAGNOSIS — Z87891 Personal history of nicotine dependence: Secondary | ICD-10-CM | POA: Diagnosis not present

## 2018-04-02 DIAGNOSIS — C20 Malignant neoplasm of rectum: Secondary | ICD-10-CM

## 2018-04-02 DIAGNOSIS — F419 Anxiety disorder, unspecified: Secondary | ICD-10-CM | POA: Diagnosis not present

## 2018-04-02 DIAGNOSIS — Z452 Encounter for adjustment and management of vascular access device: Secondary | ICD-10-CM | POA: Insufficient documentation

## 2018-04-02 DIAGNOSIS — R5383 Other fatigue: Secondary | ICD-10-CM | POA: Diagnosis not present

## 2018-04-02 LAB — COMPREHENSIVE METABOLIC PANEL
ALK PHOS: 70 U/L (ref 38–126)
ALT: 96 U/L — ABNORMAL HIGH (ref 0–44)
AST: 67 U/L — ABNORMAL HIGH (ref 15–41)
Albumin: 4.3 g/dL (ref 3.5–5.0)
Anion gap: 9 (ref 5–15)
BUN: 11 mg/dL (ref 6–20)
CO2: 25 mmol/L (ref 22–32)
Calcium: 9.2 mg/dL (ref 8.9–10.3)
Chloride: 101 mmol/L (ref 98–111)
Creatinine, Ser: 0.93 mg/dL (ref 0.44–1.00)
GFR calc Af Amer: >60 mL/min (ref >60)
GFR calc non Af Amer: >60 mL/min (ref >60)
GLUCOSE: 118 mg/dL — AB (ref 70–99)
Potassium: 3.5 mmol/L (ref 3.5–5.1)
Sodium: 135 mmol/L (ref 135–145)
Total Bilirubin: 2.6 mg/dL — ABNORMAL HIGH (ref 0.3–1.2)
Total Protein: 7.3 g/dL (ref 6.5–8.1)

## 2018-04-02 LAB — CBC WITH DIFFERENTIAL/PLATELET
Abs Immature Granulocytes: 0.03 10*3/uL (ref 0.00–0.07)
BASOS ABS: 0 10*3/uL (ref 0.0–0.1)
Basophils Relative: 1 %
Eosinophils Absolute: 0.1 10*3/uL (ref 0.0–0.5)
Eosinophils Relative: 1 %
HEMATOCRIT: 35.4 % — AB (ref 36.0–46.0)
Hemoglobin: 12.1 g/dL (ref 12.0–15.0)
Immature Granulocytes: 1 %
Lymphocytes Relative: 37 %
Lymphs Abs: 1.3 10*3/uL (ref 0.7–4.0)
MCH: 32 pg (ref 26.0–34.0)
MCHC: 34.2 g/dL (ref 30.0–36.0)
MCV: 93.7 fL (ref 80.0–100.0)
MONO ABS: 0.4 10*3/uL (ref 0.1–1.0)
Monocytes Relative: 12 %
Neutro Abs: 1.7 10*3/uL (ref 1.7–7.7)
Neutrophils Relative %: 48 %
Platelets: 125 10*3/uL — ABNORMAL LOW (ref 150–400)
RBC: 3.78 MIL/uL — ABNORMAL LOW (ref 3.87–5.11)
RDW: 13.5 % (ref 11.5–15.5)
WBC: 3.5 10*3/uL — ABNORMAL LOW (ref 4.0–10.5)
nRBC: 0 % (ref 0.0–0.2)

## 2018-04-02 LAB — MAGNESIUM: Magnesium: 2.2 mg/dL (ref 1.7–2.4)

## 2018-04-02 LAB — MISC LABCORP TEST (SEND OUT): Labcorp test code: 511500

## 2018-04-02 NOTE — Patient Outreach (Addendum)
[facility]) Care Management  04/02/2018  [name] [birth year] [id number]   Transition of care telephone call:   Subjective: Samantha Reynolds immediately returned call to this RNCM from her work phone after this RNCM left a message on her mobile number. 2 HIPAA identifiers verified. Explained purpose of call and completed transition of care assessment. Samantha Reynolds states she is still a little  weak and plans to take a nap during her lunch break on the days she works. 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 She says her husband is an Therapist, sports and is Insurance account manager and that he is keeping a close eye on her. She says she has 4 children- all boys ages 60,25,22,and 17. She says the 51 and 55 year old still live a home. Her older children are about 25- 30 minutes away and they have children- all girls.  Samantha Reynolds says she has been in touch with the RN Navigator Jene Every at the [facility].  Objective:  Samantha Reynolds was hospitalized at [facility] from 12/31-03/29/18 for fever of 102 with sepsis and right lower lobe pneumonia  approximately 4 hours after she received her first treatment of chemotherapy (FOLFOX). 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 She was discharged to home on 03/29/18 without the need for home health services.  Assessment:  See transition of care flowsheet for assessment details. .   Plan:  No current care management needs identified so will close case to [place] Management care management services and route successful outreach letter to [facility] clinical  pool to be mailed to patient's home address.   Barrington Ellison RN,CCM,CDE [place] Management Coordinator Office Phone [phone] Office Fax [phone]

## 2018-04-05 ENCOUNTER — Other Ambulatory Visit: Payer: Self-pay | Admitting: *Deleted

## 2018-04-05 ENCOUNTER — Encounter (HOSPITAL_COMMUNITY): Payer: Self-pay | Admitting: Hematology

## 2018-04-05 NOTE — Patient Outreach (Signed)
[facility]) Care Management  04/05/2018  Samantha Reynolds 1966/01/29 [id number]   Monthly UMR Case Management Review  Monthly telephonic UMR case management review with [facility] RNCM. Update provided per details in transition of care call note on 04/02/18.  Barrington Ellison RN,CCM,CDE [place] Management Coordinator Office Phone [phone] Office Fax [phone]

## 2018-04-09 ENCOUNTER — Inpatient Hospital Stay (HOSPITAL_COMMUNITY): Payer: 59

## 2018-04-09 DIAGNOSIS — C20 Malignant neoplasm of rectum: Secondary | ICD-10-CM

## 2018-04-09 DIAGNOSIS — Z452 Encounter for adjustment and management of vascular access device: Secondary | ICD-10-CM | POA: Diagnosis not present

## 2018-04-09 DIAGNOSIS — D696 Thrombocytopenia, unspecified: Secondary | ICD-10-CM | POA: Diagnosis not present

## 2018-04-09 DIAGNOSIS — Z5111 Encounter for antineoplastic chemotherapy: Secondary | ICD-10-CM | POA: Diagnosis not present

## 2018-04-09 DIAGNOSIS — R5383 Other fatigue: Secondary | ICD-10-CM | POA: Diagnosis not present

## 2018-04-09 DIAGNOSIS — K5900 Constipation, unspecified: Secondary | ICD-10-CM | POA: Diagnosis not present

## 2018-04-09 DIAGNOSIS — R110 Nausea: Secondary | ICD-10-CM | POA: Diagnosis not present

## 2018-04-09 DIAGNOSIS — C787 Secondary malignant neoplasm of liver and intrahepatic bile duct: Secondary | ICD-10-CM | POA: Diagnosis not present

## 2018-04-09 DIAGNOSIS — F419 Anxiety disorder, unspecified: Secondary | ICD-10-CM | POA: Diagnosis not present

## 2018-04-09 LAB — CBC WITH DIFFERENTIAL/PLATELET
Abs Immature Granulocytes: 0.03 10*3/uL (ref 0.00–0.07)
Basophils Absolute: 0 10*3/uL (ref 0.0–0.1)
Basophils Relative: 0 %
Eosinophils Absolute: 0 10*3/uL (ref 0.0–0.5)
Eosinophils Relative: 0 %
HEMATOCRIT: 34.4 % — AB (ref 36.0–46.0)
Hemoglobin: 11.6 g/dL — ABNORMAL LOW (ref 12.0–15.0)
Immature Granulocytes: 1 %
Lymphocytes Relative: 26 %
Lymphs Abs: 1.2 10*3/uL (ref 0.7–4.0)
MCH: 32.3 pg (ref 26.0–34.0)
MCHC: 33.7 g/dL (ref 30.0–36.0)
MCV: 95.8 fL (ref 80.0–100.0)
MONOS PCT: 9 %
Monocytes Absolute: 0.4 10*3/uL (ref 0.1–1.0)
Neutro Abs: 3 10*3/uL (ref 1.7–7.7)
Neutrophils Relative %: 64 %
Platelets: 85 10*3/uL — ABNORMAL LOW (ref 150–400)
RBC: 3.59 MIL/uL — ABNORMAL LOW (ref 3.87–5.11)
RDW: 15.7 % — ABNORMAL HIGH (ref 11.5–15.5)
WBC: 4.7 10*3/uL (ref 4.0–10.5)
nRBC: 0 % (ref 0.0–0.2)

## 2018-04-09 LAB — COMPREHENSIVE METABOLIC PANEL
ALT: 37 U/L (ref 0–44)
AST: 34 U/L (ref 15–41)
Albumin: 4.3 g/dL (ref 3.5–5.0)
Alkaline Phosphatase: 65 U/L (ref 38–126)
Anion gap: 7 (ref 5–15)
BUN: 10 mg/dL (ref 6–20)
CO2: 24 mmol/L (ref 22–32)
Calcium: 9.2 mg/dL (ref 8.9–10.3)
Chloride: 108 mmol/L (ref 98–111)
Creatinine, Ser: 0.78 mg/dL (ref 0.44–1.00)
Glucose, Bld: 102 mg/dL — ABNORMAL HIGH (ref 70–99)
Potassium: 3.7 mmol/L (ref 3.5–5.1)
Sodium: 139 mmol/L (ref 135–145)
TOTAL PROTEIN: 7.5 g/dL (ref 6.5–8.1)
Total Bilirubin: 4.2 mg/dL — ABNORMAL HIGH (ref 0.3–1.2)

## 2018-04-10 ENCOUNTER — Inpatient Hospital Stay (HOSPITAL_COMMUNITY): Payer: 59 | Admitting: Dietician

## 2018-04-10 ENCOUNTER — Encounter (HOSPITAL_COMMUNITY): Payer: Self-pay | Admitting: Hematology

## 2018-04-10 ENCOUNTER — Encounter (HOSPITAL_COMMUNITY): Payer: Self-pay

## 2018-04-10 ENCOUNTER — Inpatient Hospital Stay (HOSPITAL_COMMUNITY): Payer: 59

## 2018-04-10 ENCOUNTER — Other Ambulatory Visit: Payer: Self-pay

## 2018-04-10 ENCOUNTER — Inpatient Hospital Stay (HOSPITAL_BASED_OUTPATIENT_CLINIC_OR_DEPARTMENT_OTHER): Payer: 59 | Admitting: Hematology

## 2018-04-10 ENCOUNTER — Other Ambulatory Visit (HOSPITAL_COMMUNITY): Payer: 59

## 2018-04-10 VITALS — BP 113/71 | HR 97 | Temp 97.9°F | Wt 168.0 lb

## 2018-04-10 DIAGNOSIS — D696 Thrombocytopenia, unspecified: Secondary | ICD-10-CM | POA: Diagnosis not present

## 2018-04-10 DIAGNOSIS — R110 Nausea: Secondary | ICD-10-CM

## 2018-04-10 DIAGNOSIS — Z79899 Other long term (current) drug therapy: Secondary | ICD-10-CM | POA: Diagnosis not present

## 2018-04-10 DIAGNOSIS — Z85850 Personal history of malignant neoplasm of thyroid: Secondary | ICD-10-CM

## 2018-04-10 DIAGNOSIS — Z452 Encounter for adjustment and management of vascular access device: Secondary | ICD-10-CM | POA: Diagnosis not present

## 2018-04-10 DIAGNOSIS — F419 Anxiety disorder, unspecified: Secondary | ICD-10-CM | POA: Diagnosis not present

## 2018-04-10 DIAGNOSIS — R5383 Other fatigue: Secondary | ICD-10-CM | POA: Diagnosis not present

## 2018-04-10 DIAGNOSIS — C20 Malignant neoplasm of rectum: Secondary | ICD-10-CM | POA: Diagnosis not present

## 2018-04-10 DIAGNOSIS — C787 Secondary malignant neoplasm of liver and intrahepatic bile duct: Secondary | ICD-10-CM | POA: Diagnosis not present

## 2018-04-10 DIAGNOSIS — Z87891 Personal history of nicotine dependence: Secondary | ICD-10-CM

## 2018-04-10 DIAGNOSIS — K5900 Constipation, unspecified: Secondary | ICD-10-CM

## 2018-04-10 DIAGNOSIS — Z5111 Encounter for antineoplastic chemotherapy: Secondary | ICD-10-CM | POA: Diagnosis not present

## 2018-04-10 LAB — CEA: CEA: 2.1 ng/mL (ref 0.0–4.7)

## 2018-04-10 NOTE — Progress Notes (Signed)
Pt presents today for Folfoxiri. VSS. Pt has no complaints of pain or any changes since the last visit. MAR updated and reviewed. Labs drawn yesterday 04/09/2018. Platelets 85.   Dr.Katragadda at the bedside. Plan of care discussed with patient. Per MD we will hold treatment today. Lab work to be drawn on Monday per MD. Bilirubin elevated 4.2 resulting in chemo held today.

## 2018-04-10 NOTE — Progress Notes (Signed)
[facility] [place],  [address]   CLINIC:  Medical Oncology/Hematology  PCP:  [name], MD [address] [place] [address] [phone]   REASON FOR VISIT: Follow-up for rectal cancer  CURRENT THERAPY:Folfox  BRIEF ONCOLOGIC HISTORY:    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[place])   02/26/2018 Initial Diagnosis    Rectal cancer ([place])    03/13/2018 Cancer Staging    Staging form: Colon and Rectum, AJCC 8th Edition - Clinical stage from 03/13/2018: Stage IVA (cT3, cN1b, cM1a) - Signed by Derek Jack, MD on 03/13/2018    03/14/2018 -  Chemotherapy    The patient had palonosetron (ALOXI) injection 0.25 mg, 0.25 mg, Intravenous,  Once, 2 of 6 cycles Administration: 0.25 mg (03/14/2018), 0.25 mg (03/27/2018) bevacizumab (AVASTIN) 400 mg in sodium chloride 0.9 % 100 mL chemo infusion, 5 mg/kg = 400 mg, Intravenous,  Once, 0 of 4 cycles irinotecan (CAMPTOSAR) 320 mg in dextrose 5 % 500 mL chemo infusion, 165 mg/m2 = 320 mg, Intravenous,  Once, 0 of 4 cycles leucovorin 800 mg in dextrose 5 % 250 mL infusion, 772 mg, Intravenous,  Once, 2 of 6 cycles Administration: 800 mg (03/14/2018), 800 mg (03/27/2018) oxaliplatin (ELOXATIN) 165 mg in dextrose 5 % 500 mL chemo infusion, 85 mg/m2 = 165 mg, Intravenous,  Once, 2 of 6 cycles Administration: 165 mg (03/14/2018), 165 mg (03/27/2018) fluorouracil (ADRUCIL) chemo injection 750 mg, 400 mg/m2 = 750 mg (100 % of original dose 400 mg/m2), Intravenous,  Once, 1 of 1 cycle Dose modification: 400 mg/m2 (original dose 400 mg/m2, Cycle 1) Administration: 750 mg (03/14/2018) fluorouracil (ADRUCIL) 4,650 mg in sodium chloride 0.9 % 57 mL chemo infusion, 2,400 mg/m2 = 4,650 mg, Intravenous, 1 Day/Dose, 2 of 6 cycles Administration: 4,650 mg (03/14/2018), 4,650 mg (03/27/2018)  for chemotherapy treatment.       CANCER STAGING: Cancer Staging Malignant neoplasm of rectum [facility]) Staging  form: Colon and Rectum, AJCC 8th Edition - Clinical stage from 03/13/2018: Stage IVA (cT3, cN1b, cM1a) - Signed by Derek Jack, MD on 03/13/2018    INTERVAL HISTORY:  Samantha Reynolds 53 y.o. female returns for routine follow-up for rectal cancer. She is here today with her husband. She is doing well and tolerating treatment well. She was still able to work her full time job with only fatigue and occasional nausea. Denies any vomiting or diarrhea. Denies any new pains. Had not noticed any recent bleeding such as epistaxis, hematuria or hematochezia. Denies recent chest pain on exertion, shortness of breath on minimal exertion, pre-syncopal episodes, or palpitations. Denies any numbness or tingling in hands or feet. Denies any recent fevers, infections, or recent hospitalizations. She reports her appetite is at 100% and her energy level is at 75%. Otherwise she feels ready for her next treatment.      REVIEW OF SYSTEMS:  Review of Systems  Constitutional: Positive for fatigue.  Gastrointestinal: Positive for nausea.  All other systems reviewed and are negative.    PAST MEDICAL/SURGICAL HISTORY:  Past Medical History:  Diagnosis Date  . Anxiety   . Cancer [facility]) 2013   thyroid  . Endometrial polyp   . Endometrial thickening on ultra sound   . GERD (gastroesophageal reflux disease)   . 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 H/O Hashimoto thyroiditis   . 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 Hyperlipidemia 11/09/2015  . Hypothyroidism   . 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[place]) 02/26/2018   Past Surgical History:  Procedure Laterality Date  . BIOPSY  02/19/2018   Procedure: BIOPSY;  Surgeon: Rogene Houston, MD;  [facility];  Service: Endoscopy;;  rectum  . COLONOSCOPY N/A 08/20/2015   Procedure: COLONOSCOPY;  Surgeon: Rogene Houston, MD;   [facility];  Service: Endoscopy;  Laterality: N/A;  [address]  . [place] ENDOMETRIAL ABLATION  08-13-2004  . FLEXIBLE SIGMOIDOSCOPY N/A 02/19/2018   Procedure: FLEXIBLE SIGMOIDOSCOPY;  Surgeon: Rogene Houston, MD;  [facility];  Service: Endoscopy;  Laterality: N/A;  . HYSTEROSCOPY W/D&C N/A 04/30/2015   Procedure: DILATATION AND CURETTAGE / INTENDED HYSTEROSCOPY;  Surgeon: Dian Queen, MD;  Location: [place];  Service: Gynecology;  Laterality: N/A;  . IR US GUIDE BX ASP/DRAIN  03/09/2018  . LAPAROSCOPIC CHOLECYSTECTOMY  04/1996  . POLYPECTOMY  08/20/2015   Procedure: POLYPECTOMY;  Surgeon: Rogene Houston, MD;  [facility];  Service: Endoscopy;;  Splenic flexure polypectomy  . POLYPECTOMY  02/19/2018   Procedure: POLYPECTOMY;  Surgeon: Rogene Houston, MD;  [facility];  Service: Endoscopy;;  rectum  . PORTACATH PLACEMENT N/A 03/14/2018   Procedure: INSERTION PORT-A-CATH;  Surgeon: Aviva Signs, MD;  Location: [facility];  Service: General;  Laterality: N/A;  . REDUCTION INCARCERATED UTERUS  06-20-2000   intrauterine preg. 13 wks /  urinary retention  . THYROID LOBECTOMY  11/24/2011   Procedure: THYROID LOBECTOMY;  Surgeon: Earnstine Regal, MD;  Location: [facility];  Service: General;  Laterality: Left;  Left Thyroid Lobectomy  . TUBAL LIGATION  2002     SOCIAL HISTORY:  Social History   Socioeconomic History  . Marital status: Married    Spouse name: Not on file  . Number of children: 4  . Years of education: Not on file  . Highest education level: Not on file  Occupational History  . Not on file  Social Needs  . Financial resource strain: Not hard at all  . Food insecurity:    Worry: Never true    Inability: Never true  . Transportation needs:    Medical: No    Non-medical: No  Tobacco Use  . Smoking status: Former Smoker    Packs/day: 0.25    Years: 10.00    Pack years: 2.50    Types:  Cigarettes    Last attempt to quit: 10/31/1991    Years since quitting: 26.4  . Smokeless tobacco: Never Used  Substance and Sexual Activity  . Alcohol use: No  . Drug use: No  . Sexual activity: Not Currently  Lifestyle  . Physical activity:    Days per week: 0 days    Minutes per session: 0 min  . Stress: To some extent  Relationships  . Social connections:    Talks on phone: More than three times a week    Gets together: Twice a week    Attends religious service: More than 4 times per year    Active member of club or organization: Yes    Attends meetings of clubs or organizations: More than 4 times per year    Relationship status: Married  . Intimate partner violence:    Fear of current or ex partner: No    Emotionally abused: No    Physically abused: No  Forced sexual activity: No  Other Topics Concern  . Not on file  Social History Narrative  . Not on file    FAMILY HISTORY:  Family History  Problem Relation Age of Onset  . Hypertension Mother   . Hypertension Father   . Prostate cancer Father   . Cancer Maternal Aunt        spine/back  . Cancer Paternal Grandfather        lung  . Healthy Son   . Healthy Son   . Healthy Son   . Healthy Son   . Colon cancer Neg Hx     CURRENT MEDICATIONS:  Outpatient Encounter Medications as of 04/10/2018  Medication Sig Note  . ALPRAZolam (XANAX) 0.5 MG tablet Take 1 tablet (0.5 mg total) by mouth 2 (two) times daily as needed for anxiety. (Patient taking differently: Take 1 mg by mouth at bedtime. *May take twice daily as needed for anxiety)   . dexamethasone (DECADRON) 4 MG tablet Take 1 tablet (4 mg total) by mouth daily. For 2 days after chemotherapy (Patient taking differently: Take 4 mg by mouth daily. For 2 days after chemotherapy. Treatments are every 2 weeks) 03/27/2018: Due for dose on 03/28/2018  . docusate sodium (COLACE) 100 MG capsule Take 100 mg by mouth daily as needed for mild constipation.   . fluorouracil  CALGB 22297 in sodium chloride 0.9 % 150 mL Inject into the vein over 96 hr.   . fluticasone (CUTIVATE) 0.05 % cream Apply 1 application topically 2 (two) times daily as needed. Skin irritation/rash   . leucovorin in dextrose 5 % 250 mL Inject into the vein once.   . lidocaine-prilocaine (EMLA) cream Apply small amount to port site one hour prior to appointment and cover with plastic wrap.   . OXALIPLATIN IV Inject into the vein every 14 (fourteen) days.    . prochlorperazine (COMPAZINE) 10 MG tablet Take 1 tablet (10 mg total) by mouth every 6 (six) hours as needed for nausea or vomiting.   . progesterone (PROMETRIUM) 100 MG capsule Take 100 mg by mouth at bedtime.   Marland Kitchen thyroid (ARMOUR) 90 MG tablet Take 90 mg by mouth daily before breakfast.     No facility-administered encounter medications on file as of 04/10/2018.     ALLERGIES:  Please see allergy list.  PHYSICAL EXAM:  ECOG Performance status: 1  Vital SIGNS: BP:113/71, P:97, R:18, T:97.9, O2:100% Weight: 168  Physical Exam Constitutional:      Appearance: Normal appearance. She is normal weight.  Musculoskeletal: Normal range of motion.  Skin:    General: Skin is warm and dry.  Neurological:     Mental Status: She is alert and oriented to person, place, and time. Mental status is at baseline.  Psychiatric:        Mood and Affect: Mood normal.        Behavior: Behavior normal.        Thought Content: Thought content normal.        Judgment: Judgment normal.      LABORATORY DATA:  I have reviewed the labs as listed.  CBC    Component Value Date/Time   WBC 4.7 04/09/2018 0855   RBC 3.59 (L) 04/09/2018 0855   HGB 11.6 (L) 04/09/2018 0855   HGB 13.5 11/03/2015 0841   HCT 34.4 (L) 04/09/2018 0855   HCT 39.7 11/03/2015 0841   PLT 85 (L) 04/09/2018 0855   PLT 165 11/03/2015 0841   MCV 95.8 04/09/2018  0855   MCV 95 11/03/2015 0841   MCH 32.3 04/09/2018 0855   MCHC 33.7 04/09/2018 0855   RDW 15.7 (H) 04/09/2018 0855    RDW 13.2 11/03/2015 0841   LYMPHSABS 1.2 04/09/2018 0855   LYMPHSABS 1.4 11/03/2015 0841   MONOABS 0.4 04/09/2018 0855   EOSABS 0.0 04/09/2018 0855   EOSABS 0.1 11/03/2015 0841   BASOSABS 0.0 04/09/2018 0855   BASOSABS 0.0 11/03/2015 0841   CMP Latest Ref Rng & Units 04/09/2018 04/02/2018 03/29/2018  Glucose 70 - 99 mg/dL 102(H) 118(H) 100(H)  BUN 6 - 20 mg/dL '10 11 8  '$ Creatinine 0.44 - 1.00 mg/dL 0.78 0.93 0.65  Sodium 135 - 145 mmol/L 139 135 138  Potassium 3.5 - 5.1 mmol/L 3.7 3.5 4.6  Chloride 98 - 111 mmol/L 108 101 114(H)  CO2 22 - 32 mmol/L 24 25 21(L)  Calcium 8.9 - 10.3 mg/dL 9.2 9.2 7.9(L)  Total Protein 6.5 - 8.1 g/dL 7.5 7.3 5.4(L)  Total Bilirubin 0.3 - 1.2 mg/dL 4.2(H) 2.6(H) 2.6(H)  Alkaline Phos 38 - 126 U/L 65 70 58  AST 15 - 41 U/L 34 67(H) 85(H)  ALT 0 - 44 U/L 37 96(H) 119(H)       DIAGNOSTIC IMAGING:  I have independently reviewed the scans and discussed with the patient.   I have reviewed Francene Finders, NP's note and agree with the documentation.  I personally performed a face-to-face visit, made revisions and my assessment and plan is as follows.    ASSESSMENT & PLAN:   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HCC) 1.  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 - Colonoscopy on 02/19/2018 shows rectal mass, 2 to 4 cm from the anal verge, normal sigmoid colon.  Biopsies consistent with adenocarcinoma. - MRI of the pelvis shows rectal adenocarcinoma, T stage-T3, N stage- N1-equivocal N2 nodes.  Maximum extension beyond muscularis propria is 6 mm (advanced T3).  Circumferential resection margin is 7 mm.  Right obturator node 6 mm.  Right external iliac node at 8 mm.  Craniocaudal extent of the tumor is 4.4 cm.  Approximately 180 degree circumferential.   Shortest distance from the distal tumor to anal sphincter is 2.1 cm.  Location from anal verge, 7.2 cm to the middle of the tumor. - CT CAP on 02/21/2018 shows 9 x 7 mm left lower lobe pulmonary nodule, new since 2009.  Possible subpleural left lower lobe 3 mm nodule versus area of pleural thickening.  Vague area of hypoattenuation in the posterior right hepatic lobe measuring 1.6 cm.  Differential includes benign/malignant lesion with altered perfusion.  Focal steatosis is possible. - MRI of the liver on 03/08/2018 shows 1.4 cm lesion in the posterior right hepatic lobe.  There is a 5 mm focus of delayed hyperenhancement in the lateral segment of the left liver, questionable for a second metastatic deposit. -PET CT scan on 03/05/2018 shows hypermetabolic rectal mass.  Left perirectal hypermetabolic and sigmoid mesocolon lymph nodes.  Single hepatic metastatic lesion in the right hepatic lobe.  7 mm pulmonary nodule at the left lung base is suspicious for meta stasis. -Liver biopsy on 03/09/2018 consistent with adenocarcinoma.  -We will obtain foundation 1 and MSI testing. - Cycle 1 FOLFOX on 03/14/2018.  She had constipation lasting for 5 days.  She also  had cold sensitivity for few days.  She had some nausea but denied any vomiting. - Cycle 2 of FOLFOX on 03/27/2018. -She was admitted to the hospital from 03/27/2018 through 03/29/2018 with neutropenic fever and sepsis.  She received IV antibiotics. - She worked all last week without any problems. -Today's blood work shows her bilirubin has increased to 4.6.  Hence I have recommended to hold her chemotherapy cycle. - We will check her total bilirubin and fractionation on Monday. 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  Low starting dose of Irinotecan is indicated. -She has mild thrombocytopenia with platelet count of 85.  2.  Thyroid cancer: -She had a left thyroidectomy done  about 6 years ago.  She is on Synthroid at this time. -We plan to check her TSH, thyroglobulin and antithyroglobulin antibodies in later part of this month.      Orders placed this encounter:  Orders Placed This Encounter  Procedures  . Magnesium  . CBC with Differential/Platelet  . Comprehensive metabolic panel  . CEA      Derek Jack, MD [place] [phone]

## 2018-04-10 NOTE — Patient Instructions (Signed)
[facility] at [facility] Discharge Instructions     Thank you for choosing [facility] at [facility] to provide your oncology and hematology care.  To afford each patient quality time with our provider, please arrive at least 15 minutes before your scheduled appointment time.   If you have a lab appointment with the Cancer Center please come in thru the  Main Entrance and check in at the main information desk  You need to re-schedule your appointment should you arrive 10 or more minutes late.  We strive to give you quality time with our providers, and arriving late affects you and other patients whose appointments are after yours.  Also, if you no show three or more times for appointments you may be dismissed from the clinic at the providers discretion.     Again, thank you for choosing [facility].  Our hope is that these requests will decrease the amount of time that you wait before being seen by our physicians.       _____________________________________________________________  Should you have questions after your visit to [facility], please contact our office at [phone] between the hours of 8:00 a.m. and 4:30 p.m.  Voicemails left after 4:00 p.m. will not be returned until the following business day.  For prescription refill requests, have your pharmacy contact our office and allow 72 hours.    Cancer Center Support Programs:   > Cancer Support Group  2nd Tuesday of the month 1pm-2pm, Journey Room    

## 2018-04-10 NOTE — Assessment & Plan Note (Signed)
1.  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 - Colonoscopy on 02/19/2018 shows rectal mass, 2 to 4 cm from the anal verge, normal sigmoid colon.  Biopsies consistent with adenocarcinoma. - MRI of the pelvis shows rectal adenocarcinoma, T stage-T3, N stage- N1-equivocal N2 nodes.  Maximum extension beyond muscularis propria is 6 mm (advanced T3).  Circumferential resection margin is 7 mm.  Right obturator node 6 mm.  Right external iliac node at 8 mm.  Craniocaudal extent of the tumor is 4.4 cm.  Approximately 180 degree circumferential.  Shortest distance from the distal tumor to anal sphincter is 2.1 cm.  Location from anal verge, 7.2 cm to the middle of the tumor. - CT CAP on 02/21/2018 shows 9 x 7 mm left lower lobe pulmonary nodule, new since 2009.  Possible subpleural left lower lobe 3 mm nodule versus area of pleural thickening.  Vague area of hypoattenuation in the posterior right hepatic lobe measuring 1.6 cm.  Differential includes benign/malignant lesion with altered perfusion.  Focal steatosis is possible. - MRI of the liver on 03/08/2018 shows 1.4 cm lesion in the posterior right hepatic lobe.  There is a 5 mm focus of delayed hyperenhancement in the lateral segment of the left liver, questionable for a second metastatic deposit. -PET CT scan on 03/05/2018 shows hypermetabolic rectal mass.  Left perirectal hypermetabolic and sigmoid mesocolon lymph nodes.  Single hepatic metastatic lesion in the right hepatic lobe.  7 mm pulmonary nodule at the left lung base is suspicious for meta stasis. -Liver biopsy on 03/09/2018 consistent with adenocarcinoma.  -We will obtain foundation 1 and MSI testing. - Cycle 1 FOLFOX on 03/14/2018.  She had constipation lasting for 5 days.  She also  had cold sensitivity for few days.  She had some nausea but denied any vomiting. - Cycle 2 of FOLFOX on 03/27/2018. -She was admitted to the hospital from 03/27/2018 through 03/29/2018 with neutropenic fever and sepsis.  She received IV antibiotics. - She worked all last week without any problems. -Today's blood work shows her bilirubin has increased to 4.6.  Hence I have recommended to hold her chemotherapy cycle. - We will check her total bilirubin and fractionation on Monday. 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  Low starting dose of Irinotecan is indicated. -She has mild thrombocytopenia with platelet count of 85.  2.  Thyroid cancer: -She had a left thyroidectomy done about 6 years ago.  She is on Synthroid at this time. -We plan to check her TSH, thyroglobulin and antithyroglobulin antibodies in later part of this month.

## 2018-04-11 ENCOUNTER — Ambulatory Visit (HOSPITAL_COMMUNITY): Payer: 59 | Admitting: Hematology

## 2018-04-11 ENCOUNTER — Ambulatory Visit (HOSPITAL_COMMUNITY): Payer: 59

## 2018-04-12 ENCOUNTER — Encounter (HOSPITAL_COMMUNITY): Payer: 59

## 2018-04-13 ENCOUNTER — Encounter (HOSPITAL_COMMUNITY): Payer: 59

## 2018-04-14 DIAGNOSIS — C20 Malignant neoplasm of rectum: Secondary | ICD-10-CM | POA: Diagnosis not present

## 2018-04-16 ENCOUNTER — Encounter (HOSPITAL_COMMUNITY): Payer: Self-pay | Admitting: *Deleted

## 2018-04-16 ENCOUNTER — Other Ambulatory Visit (HOSPITAL_COMMUNITY): Payer: Self-pay | Admitting: Surgery

## 2018-04-16 ENCOUNTER — Other Ambulatory Visit (HOSPITAL_COMMUNITY): Payer: Self-pay

## 2018-04-16 ENCOUNTER — Ambulatory Visit (HOSPITAL_COMMUNITY)
Admission: RE | Admit: 2018-04-16 | Discharge: 2018-04-16 | Disposition: A | Discharge status: Home / Self Care | Payer: 59 | Source: Ambulatory Visit | Attending: Nurse Practitioner | Admitting: Nurse Practitioner

## 2018-04-16 ENCOUNTER — Inpatient Hospital Stay (HOSPITAL_COMMUNITY): Payer: 59

## 2018-04-16 ENCOUNTER — Other Ambulatory Visit (HOSPITAL_COMMUNITY): Payer: Self-pay | Admitting: Pharmacist

## 2018-04-16 ENCOUNTER — Other Ambulatory Visit (INDEPENDENT_AMBULATORY_CARE_PROVIDER_SITE_OTHER): Payer: Self-pay | Admitting: Internal Medicine

## 2018-04-16 ENCOUNTER — Other Ambulatory Visit (HOSPITAL_COMMUNITY): Payer: Self-pay | Admitting: Nurse Practitioner

## 2018-04-16 DIAGNOSIS — C20 Malignant neoplasm of rectum: Secondary | ICD-10-CM | POA: Diagnosis not present

## 2018-04-16 DIAGNOSIS — R17 Unspecified jaundice: Secondary | ICD-10-CM

## 2018-04-16 DIAGNOSIS — F419 Anxiety disorder, unspecified: Secondary | ICD-10-CM | POA: Diagnosis not present

## 2018-04-16 DIAGNOSIS — Z5111 Encounter for antineoplastic chemotherapy: Secondary | ICD-10-CM | POA: Diagnosis not present

## 2018-04-16 DIAGNOSIS — C787 Secondary malignant neoplasm of liver and intrahepatic bile duct: Secondary | ICD-10-CM | POA: Diagnosis not present

## 2018-04-16 DIAGNOSIS — K5900 Constipation, unspecified: Secondary | ICD-10-CM | POA: Diagnosis not present

## 2018-04-16 DIAGNOSIS — R5383 Other fatigue: Secondary | ICD-10-CM | POA: Diagnosis not present

## 2018-04-16 DIAGNOSIS — C7800 Secondary malignant neoplasm of unspecified lung: Secondary | ICD-10-CM | POA: Diagnosis not present

## 2018-04-16 DIAGNOSIS — R110 Nausea: Secondary | ICD-10-CM | POA: Diagnosis not present

## 2018-04-16 DIAGNOSIS — Z452 Encounter for adjustment and management of vascular access device: Secondary | ICD-10-CM | POA: Diagnosis not present

## 2018-04-16 DIAGNOSIS — D696 Thrombocytopenia, unspecified: Secondary | ICD-10-CM | POA: Diagnosis not present

## 2018-04-16 LAB — CBC WITH DIFFERENTIAL/PLATELET
Abs Immature Granulocytes: 0.01 10*3/uL (ref 0.00–0.07)
Basophils Absolute: 0 10*3/uL (ref 0.0–0.1)
Basophils Relative: 1 %
Eosinophils Absolute: 0 10*3/uL (ref 0.0–0.5)
Eosinophils Relative: 0 %
HCT: 37.4 % (ref 36.0–46.0)
Hemoglobin: 12.6 g/dL (ref 12.0–15.0)
Immature Granulocytes: 0 %
Lymphocytes Relative: 33 %
Lymphs Abs: 1.1 10*3/uL (ref 0.7–4.0)
MCH: 33.2 pg (ref 26.0–34.0)
MCHC: 33.7 g/dL (ref 30.0–36.0)
MCV: 98.4 fL (ref 80.0–100.0)
Monocytes Absolute: 0.3 10*3/uL (ref 0.1–1.0)
Monocytes Relative: 10 %
Neutro Abs: 1.9 10*3/uL (ref 1.7–7.7)
Neutrophils Relative %: 56 %
Platelets: 122 10*3/uL — ABNORMAL LOW (ref 150–400)
RBC: 3.8 MIL/uL — ABNORMAL LOW (ref 3.87–5.11)
RDW: 17.4 % — ABNORMAL HIGH (ref 11.5–15.5)
WBC: 3.4 10*3/uL — ABNORMAL LOW (ref 4.0–10.5)
nRBC: 0 % (ref 0.0–0.2)

## 2018-04-16 LAB — COMPREHENSIVE METABOLIC PANEL
ALT: 37 U/L (ref 0–44)
AST: 43 U/L — ABNORMAL HIGH (ref 15–41)
Albumin: 4.6 g/dL (ref 3.5–5.0)
Alkaline Phosphatase: 76 U/L (ref 38–126)
Anion gap: 11 (ref 5–15)
BUN: 15 mg/dL (ref 6–20)
CO2: 23 mmol/L (ref 22–32)
Calcium: 9.4 mg/dL (ref 8.9–10.3)
Chloride: 104 mmol/L (ref 98–111)
Creatinine, Ser: 0.69 mg/dL (ref 0.44–1.00)
GFR calc Af Amer: >60 mL/min (ref >60)
GFR calc non Af Amer: >60 mL/min (ref >60)
GLUCOSE: 105 mg/dL — AB (ref 70–99)
Potassium: 3.7 mmol/L (ref 3.5–5.1)
Sodium: 138 mmol/L (ref 135–145)
Total Bilirubin: 5 mg/dL — ABNORMAL HIGH (ref 0.3–1.2)
Total Protein: 7.9 g/dL (ref 6.5–8.1)

## 2018-04-16 LAB — BILIRUBIN, FRACTIONATED(TOT/DIR/INDIR)
Bilirubin, Direct: 0.4 mg/dL — ABNORMAL HIGH (ref 0.0–0.2)
Bilirubin, Direct: 0.4 mg/dL — ABNORMAL HIGH (ref 0.0–0.2)
Indirect Bilirubin: 4.8 mg/dL — ABNORMAL HIGH (ref 0.3–0.9)
Indirect Bilirubin: 4.2 mg/dL — ABNORMAL HIGH (ref 0.3–0.9)
Total Bilirubin: 5.2 mg/dL — ABNORMAL HIGH (ref 0.3–1.2)
Total Bilirubin: 4.6 mg/dL — ABNORMAL HIGH (ref 0.3–1.2)

## 2018-04-16 LAB — MAGNESIUM: Magnesium: 2.4 mg/dL (ref 1.7–2.4)

## 2018-04-16 NOTE — Progress Notes (Signed)
Patient's LFTs noted from this morning. Patient has known Gilbert's syndrome.  Bilirubin has increased in the setting of chemotherapy/stress.  Doubt obstructive process.  Similarly she does not have hepatocellular injury.  Liver lesion is 2.4 cm but no ductal dilation Patient advised to eat her lunch and she will have fractionated bilirubin at 4 PM.

## 2018-04-16 NOTE — Progress Notes (Signed)
Spoke with patient and reviewed lab results with her.  Per Dr. Delton Coombes we will hold patient's treatment tomorrow.

## 2018-04-16 NOTE — Progress Notes (Signed)
Lab work reviewed with Dr. Delton Coombes and Verbal orders received for additional labs.

## 2018-04-17 ENCOUNTER — Ambulatory Visit (HOSPITAL_COMMUNITY): Admission: RE | Admit: 2018-04-17 | Payer: 59 | Source: Ambulatory Visit

## 2018-04-17 ENCOUNTER — Telehealth: Payer: Self-pay | Admitting: General Practice

## 2018-04-17 ENCOUNTER — Encounter (HOSPITAL_COMMUNITY): Payer: Self-pay | Admitting: *Deleted

## 2018-04-17 ENCOUNTER — Ambulatory Visit (HOSPITAL_COMMUNITY): Payer: 59

## 2018-04-17 ENCOUNTER — Other Ambulatory Visit (HOSPITAL_COMMUNITY): Payer: Self-pay | Admitting: Nurse Practitioner

## 2018-04-17 ENCOUNTER — Other Ambulatory Visit (HOSPITAL_COMMUNITY): Payer: Self-pay | Admitting: Hematology

## 2018-04-17 ENCOUNTER — Encounter (HOSPITAL_COMMUNITY): Payer: 59 | Admitting: Dietician

## 2018-04-17 DIAGNOSIS — C20 Malignant neoplasm of rectum: Secondary | ICD-10-CM

## 2018-04-17 LAB — CEA: CEA: 1.6 ng/mL (ref 0.0–4.7)

## 2018-04-17 MED ORDER — HYDROCORTISONE 0.5 % EX CREA: 1.0000 "application " | TOPICAL_CREAM | Freq: Two times a day (BID) | CUTANEOUS | 0 refills | Status: DC

## 2018-04-17 MED ORDER — DOXYCYCLINE HYCLATE 100 MG PO TABS: 100.0000 mg | ORAL_TABLET | Freq: Two times a day (BID) | ORAL | 3 refills | Status: DC

## 2018-04-17 MED FILL — HYDROCORTISONE 1% CREAM: 1 | 14 days supply | Qty: 28 | Fill #0

## 2018-04-17 MED FILL — DOXYCYCLINE HYCLATE 100 MG: 100 | 30 days supply | Qty: 60 | Fill #0

## 2018-04-17 NOTE — Progress Notes (Addendum)
Per Dr. Delton Coombes, patient to come tomorrow for change in therapy.  Patient has been notified of appointment.   I talked to patient about the new chemotherapy agent that will be added to her regimen, Vectibix and talked about the potential side effects. I told her that she would get more teaching tomorrow.  She verbalizes understanding.   I spoke with patient and asked if she was having any more rectal bleeding. Patient states that it is minimal to none at all. She describes her stools as tan/yellow in color and soft formed and she passes large amounts 4 times daily.  She denies diarrhea. She states that if there is any blood it is at the very beginning of the stool and rarely any on the toilet tissue. She does complain of vaginal and rectal dryness but voices the use of cream that helps.   Patient also states that she has an appointment at [facility] with the Hepatologist next Tuesday.     I will relay this information to Dr. Delton Coombes.

## 2018-04-17 NOTE — Telephone Encounter (Signed)
[facility] CSW Progress Notes  Call to patient to assess for needs and resource and to encourage participation in support services programming.  Patient struggling w adjustment to chemotherapy and side effects, aware adjustments are being made to optimize treatments for her specific situation.  Wants to talk w dietician re what to eat/drink to best help herself maintain weight and adjust to treatments.  Message sent to nurse navigator and dietician.  Encouraged to come to upcoming cooking class - will come if she feels well enough after chemotherapy tomorrow.  Continues to work which is a good coping mechanism for her.  Edwyna Shell, LCSW Clinical Social Worker Phone:  [phone]

## 2018-04-18 ENCOUNTER — Encounter (HOSPITAL_COMMUNITY): Payer: Self-pay

## 2018-04-18 ENCOUNTER — Other Ambulatory Visit: Payer: Self-pay

## 2018-04-18 ENCOUNTER — Telehealth (HOSPITAL_COMMUNITY): Payer: Self-pay

## 2018-04-18 ENCOUNTER — Inpatient Hospital Stay (HOSPITAL_COMMUNITY): Payer: 59

## 2018-04-18 VITALS — BP 99/68 | HR 88 | Temp 98.4°F | Resp 18 | Wt 165.1 lb

## 2018-04-18 DIAGNOSIS — Z5111 Encounter for antineoplastic chemotherapy: Secondary | ICD-10-CM | POA: Diagnosis not present

## 2018-04-18 DIAGNOSIS — K5900 Constipation, unspecified: Secondary | ICD-10-CM | POA: Diagnosis not present

## 2018-04-18 DIAGNOSIS — Z452 Encounter for adjustment and management of vascular access device: Secondary | ICD-10-CM | POA: Diagnosis not present

## 2018-04-18 DIAGNOSIS — C20 Malignant neoplasm of rectum: Secondary | ICD-10-CM | POA: Diagnosis not present

## 2018-04-18 DIAGNOSIS — C787 Secondary malignant neoplasm of liver and intrahepatic bile duct: Secondary | ICD-10-CM | POA: Diagnosis not present

## 2018-04-18 DIAGNOSIS — R110 Nausea: Secondary | ICD-10-CM | POA: Diagnosis not present

## 2018-04-18 DIAGNOSIS — F419 Anxiety disorder, unspecified: Secondary | ICD-10-CM | POA: Diagnosis not present

## 2018-04-18 DIAGNOSIS — R5383 Other fatigue: Secondary | ICD-10-CM | POA: Diagnosis not present

## 2018-04-18 DIAGNOSIS — D696 Thrombocytopenia, unspecified: Secondary | ICD-10-CM | POA: Diagnosis not present

## 2018-04-18 LAB — URINALYSIS, DIPSTICK ONLY
Bilirubin Urine: NEGATIVE
Glucose, UA: NEGATIVE mg/dL
Hgb urine dipstick: NEGATIVE
Ketones, ur: NEGATIVE mg/dL
Leukocytes, UA: NEGATIVE
NITRITE: NEGATIVE
Protein, ur: NEGATIVE mg/dL
Specific Gravity, Urine: 1.008 (ref 1.005–1.030)
pH: 5 (ref 5.0–8.0)

## 2018-04-18 MED ORDER — SODIUM CHLORIDE 0.9 % IV SOLN
500.0000 mg | Freq: Once | INTRAVENOUS | Status: AC
  Administered 2018-04-18: 500 mg via INTRAVENOUS
  Filled 2018-04-18: qty 5

## 2018-04-18 MED ORDER — SODIUM CHLORIDE 0.9 % IV SOLN
2400.0000 mg/m2 | INTRAVENOUS | Status: DC
  Administered 2018-04-18: 4650 mg via INTRAVENOUS
  Filled 2018-04-18: qty 93

## 2018-04-18 MED ORDER — PALONOSETRON HCL INJECTION 0.25 MG/5ML
0.2500 mg | Freq: Once | INTRAVENOUS | Status: AC
  Administered 2018-04-18: 0.25 mg via INTRAVENOUS
  Filled 2018-04-18: qty 5

## 2018-04-18 MED ORDER — FLUOROURACIL CHEMO INJECTION 2.5 GM/50ML
400.0000 mg/m2 | Freq: Once | INTRAVENOUS | Status: AC
  Administered 2018-04-18: 750 mg via INTRAVENOUS
  Filled 2018-04-18: qty 15

## 2018-04-18 MED ORDER — SODIUM CHLORIDE 0.9 % IV SOLN
700.0000 mg | Freq: Once | INTRAVENOUS | Status: DC
  Filled 2018-04-18: qty 35

## 2018-04-18 MED ORDER — SODIUM CHLORIDE 0.9 % IV SOLN
INTRAVENOUS | Status: DC
  Administered 2018-04-18: 10:00:00 via INTRAVENOUS

## 2018-04-18 MED ORDER — SODIUM CHLORIDE 0.9 % IV SOLN
10.0000 mg | Freq: Once | INTRAVENOUS | Status: AC
  Administered 2018-04-18: 10 mg via INTRAVENOUS
  Filled 2018-04-18: qty 1

## 2018-04-18 MED ORDER — SODIUM CHLORIDE 0.9 % IV SOLN
700.0000 mg | Freq: Once | INTRAVENOUS | Status: AC
  Administered 2018-04-18: 700 mg via INTRAVENOUS
  Filled 2018-04-18: qty 35

## 2018-04-18 MED FILL — ALPRAZolam 0.5 MG TABS: 0.5 | 30 days supply | Qty: 60 | Fill #1

## 2018-04-18 NOTE — Progress Notes (Signed)
This patient is homozygous for the UGT allele which is significant for increased risk of irinotecan toxicity.  Pt should always be dose reduced for irinotecan .  Check with Provider if otherwise

## 2018-04-18 NOTE — Treatment Plan (Signed)
Ok to treat with elevated bilirubin, Pt has Gilbert's syndrome. No Oxliplatin  or Irinotecan until after [facility] consult Hepatology

## 2018-04-18 NOTE — Telephone Encounter (Signed)
Nutrition  Called patient to inform of changing cooking class tomorrow to Wednesday Jan 29th.  Patient wanted to discuss nutrition concerns.  Questions addressed. Will have face to face meeting with patient on Friday.  Bernd Crom B. Zenia Resides, [place], [place] Registered Dietitian [phone] (pager)

## 2018-04-18 NOTE — Progress Notes (Signed)
Pt presents today for Vectibix, Leucovorin/ Flourouracil / 18fu pump. VSS. MAR updated and reviewed with patient. Consent for Vectibix signed, authorization with Angie confirmed. Labs drawn on Jan. 20th. Mag level 2.4. UA obtained and sent to lab per pharmacist order. Dipstick UA negative for protein. VO received from Dr. Delton Coombes to proceed with treatment. Per Dr. Delton Coombes, tell pt to stop taking Decadron at home and Benadryl can be used over the counter at home for rash PRN. May cut pill in half per MD.   Pt takes Armour 90mg  daily for Thyroid. Per Dr. Delton Coombes, we will use her labs from 03/13/2018 as baseline for TSH. MD to adjust her medication if needed while on Vectibix.   Treatment given today per MD orders. Tolerated infusion without adverse affects. Vital signs stable. No complaints at this time. Discharged from clinic ambulatory. 67F/U infusing per protocol. Patent. F/U with [facility] as scheduled.

## 2018-04-18 NOTE — Patient Instructions (Signed)
[facility] Discharge Instructions for Patients Receiving Chemotherapy  Today you received the following chemotherapy agents   To help prevent nausea and vomiting after your treatment, we encourage you to take your nausea medication   If you develop nausea and vomiting that is not controlled by your nausea medication, call the clinic.   BELOW ARE SYMPTOMS THAT SHOULD BE REPORTED IMMEDIATELY:  *FEVER GREATER THAN 100.5 F  *CHILLS WITH OR WITHOUT FEVER  NAUSEA AND VOMITING THAT IS NOT CONTROLLED WITH YOUR NAUSEA MEDICATION  *UNUSUAL SHORTNESS OF BREATH  *UNUSUAL BRUISING OR BLEEDING  TENDERNESS IN MOUTH AND THROAT WITH OR WITHOUT PRESENCE OF ULCERS  *URINARY PROBLEMS  *BOWEL PROBLEMS  UNUSUAL RASH Items with * indicate a potential emergency and should be followed up as soon as possible.  Feel free to call the clinic should you have any questions or concerns. The clinic phone number is [phone].  Please show the CHEMO ALERT CARD at check-in to the Emergency Department and triage nurse.   

## 2018-04-19 ENCOUNTER — Telehealth (HOSPITAL_COMMUNITY): Payer: Self-pay

## 2018-04-19 ENCOUNTER — Encounter (HOSPITAL_COMMUNITY): Payer: 59

## 2018-04-19 NOTE — Telephone Encounter (Signed)
Called the patient for 24 hour post chemotherapy.  Patient stated prn nausea and uses medications as directed.  No other complaints voiced.

## 2018-04-20 ENCOUNTER — Inpatient Hospital Stay (HOSPITAL_COMMUNITY): Payer: 59

## 2018-04-20 ENCOUNTER — Encounter (HOSPITAL_COMMUNITY): Payer: Self-pay

## 2018-04-20 VITALS — BP 107/76 | HR 90 | Temp 98.0°F | Resp 18

## 2018-04-20 DIAGNOSIS — C20 Malignant neoplasm of rectum: Secondary | ICD-10-CM

## 2018-04-20 DIAGNOSIS — F419 Anxiety disorder, unspecified: Secondary | ICD-10-CM | POA: Diagnosis not present

## 2018-04-20 DIAGNOSIS — D696 Thrombocytopenia, unspecified: Secondary | ICD-10-CM | POA: Diagnosis not present

## 2018-04-20 DIAGNOSIS — Z452 Encounter for adjustment and management of vascular access device: Secondary | ICD-10-CM | POA: Diagnosis not present

## 2018-04-20 DIAGNOSIS — Z5111 Encounter for antineoplastic chemotherapy: Secondary | ICD-10-CM | POA: Diagnosis not present

## 2018-04-20 DIAGNOSIS — R110 Nausea: Secondary | ICD-10-CM | POA: Diagnosis not present

## 2018-04-20 DIAGNOSIS — C787 Secondary malignant neoplasm of liver and intrahepatic bile duct: Secondary | ICD-10-CM | POA: Diagnosis not present

## 2018-04-20 DIAGNOSIS — R5383 Other fatigue: Secondary | ICD-10-CM | POA: Diagnosis not present

## 2018-04-20 DIAGNOSIS — K5900 Constipation, unspecified: Secondary | ICD-10-CM | POA: Diagnosis not present

## 2018-04-20 MED ORDER — SODIUM CHLORIDE 0.9% FLUSH
10.0000 mL | INTRAVENOUS | Status: DC | PRN
  Administered 2018-04-20: 10 mL
  Filled 2018-04-20: qty 10

## 2018-04-20 MED ORDER — HEPARIN SOD (PORK) LOCK FLUSH 100 UNIT/ML IV SOLN: 500.0000 [IU] | Freq: Once | INTRAVENOUS | Status: DC

## 2018-04-20 MED ORDER — HEPARIN SOD (PORK) LOCK FLUSH 100 UNIT/ML IV SOLN
500.0000 [IU] | Freq: Once | INTRAVENOUS | Status: AC | PRN
  Administered 2018-04-20: 500 [IU]

## 2018-04-20 MED ORDER — SODIUM CHLORIDE 0.9% FLUSH: 10.0000 mL | INTRAVENOUS | Status: DC | PRN

## 2018-04-20 NOTE — Progress Notes (Signed)
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  Past medical history of thyroid cancer s/p thyroidectomy, Gilbert's syndrome, GERD, HLD. Patient receiving chemotherapy folfox initially but admitted to hospital on 12/31-1/2 with neutropenic fever and sepsis.  Now with elevated bilirubin and increased risk of irinotecan toxicity due to allele. Chemotherapy has been changed to vectibx, leucovorin, flourouracil/5FU pump.    Met with patient and husband prior to pump removal. Patient reports poor appetite, nothing really sounds good to eat.  "I have to force myself to eat."  Reports that food does not really have a taste.  "I can taste salsa, barbecue chicken pizza from [facility]."  Reports this am ate biscuit with tater tots but reports biscuit felt like it is growing in her mouth.  Likes to drink premier protein shakes, has tried Conservation officer, historic buildings but likes premier the best.  Reports last night for dinner at [employer] from [employer] and some chips.  Reports smell of greasy foods or combination of foods makes her sick on her stomach. Has noticed she gets more nauseated in the evening vs during the day. Has not noticed any certain foods causing stomach upset or abdominal pain.  Reports more constipation vs diarrhea.   Nutrition Focused Physical Exam: deferred   Medications: reviewed   Labs: reviewed   Anthropometrics:   Height: 67 inches Weight: 165 lb 2 oz (1/22) UBW: 190 lb 10/22 BMI: 25 13% weight loss in the last 3 months, significant   Estimated Energy Needs  Kcals: 2250-2625 calories Protein: 112-131 g  Fluid: >2 L/d   NUTRITION DIAGNOSIS: Inadequate oral intake related to cancer and cancer related treatment side effects as evidenced by 13% weight loss in 3 months and poor appetite    INTERVENTION:  Discussed strategies to increase calories and protein. Written material  given Cautioned patient on high fiber foods, encouraged more low fiber foods Encouraged oral nutrition supplements at least 2 per day as tolerated between meals.  Provided samples of other shakes to try.  Contact information provided to patient   MONITORING, EVALUATION, GOAL: Patient will consume adequate calories and protein to prevent weight from decreasing   Next Visit: to be determined.  Patient wants to call RD for follow-up after meets with [place].  Did not want to schedule face to face follow-up at this time.   Samantha Reynolds, [place], [place] Registered Dietitian [phone] (pager)

## 2018-04-20 NOTE — Addendum Note (Signed)
Addended by: Anselmo Rod on: 04/20/2018 02:40 PM   Modules accepted: Orders

## 2018-04-20 NOTE — Progress Notes (Signed)
Samantha Reynolds tolerated 5FU pump well without complaints or incident.5FU pump discontinued with portacath flushed with 10 ml NS and 5 ml Heparin easily per protocol then de-accessed. VSS Pt discharged self ambulatory in satisfactory condition accompanied by her husband

## 2018-04-20 NOTE — Patient Instructions (Signed)
[place] at [facility] Discharge Instructions  5FU pump discontinuation with portacath flush. Follow-up as scheduled. Call clinic for any questions or concerns   Thank you for choosing [place] at [facility] to provide your oncology and hematology care.  To afford each patient quality time with our provider, please arrive at least 15 minutes before your scheduled appointment time.   If you have a lab appointment with the [place] please come in thru the  Main Entrance and check in at the main information desk  You need to re-schedule your appointment should you arrive 10 or more minutes late.  We strive to give you quality time with our providers, and arriving late affects you and other patients whose appointments are after yours.  Also, if you no show three or more times for appointments you may be dismissed from the clinic at the providers discretion.     Again, thank you for choosing [facility].  Our hope is that these requests will decrease the amount of time that you wait before being seen by our physicians.       _____________________________________________________________  Should you have questions after your visit to [facility], please contact our office at [phone] between the hours of 8:00 a.m. and 4:30 p.m.  Voicemails left after 4:00 p.m. will not be returned until the following business day.  For prescription refill requests, have your pharmacy contact our office and allow 72 hours.    Cancer Center Support Programs:   > Cancer Support Group  2nd Tuesday of the month 1pm-2pm, Journey Room

## 2018-04-24 ENCOUNTER — Encounter (HOSPITAL_COMMUNITY): Payer: 59 | Admitting: Dietician

## 2018-04-24 ENCOUNTER — Ambulatory Visit (HOSPITAL_COMMUNITY): Payer: 59

## 2018-04-24 DIAGNOSIS — C20 Malignant neoplasm of rectum: Secondary | ICD-10-CM | POA: Diagnosis not present

## 2018-04-24 DIAGNOSIS — Z79899 Other long term (current) drug therapy: Secondary | ICD-10-CM | POA: Diagnosis not present

## 2018-04-26 ENCOUNTER — Encounter (HOSPITAL_COMMUNITY): Payer: 59

## 2018-04-27 ENCOUNTER — Telehealth (HOSPITAL_COMMUNITY): Payer: Self-pay | Admitting: *Deleted

## 2018-04-27 NOTE — Telephone Encounter (Signed)
Called pt yesterday, 04/26/2018, to check on her.  She reports that she is feeling well, has her normal energy, denies n/v/d.  C/o mild acneiform rash to chest and face - she reports that there are just a few bumps on her chest and several on her face that are pimple-like.  Denies pain to these areas, but states her facial skin feels tight, "like when you have a mild sunburn".  She is using hydrocortisone cream to these areas as directed and states this is helping.  She also reports some constipation - states that she is having BMs every day, but it is a "struggle".  She is currently taking Colace 100 mg bid.  Instructed pt to increase to Colace 200 mg bid per Dr. Tomie China standing orders to see if this will help with her constipation.  She verbalizes understanding.  Instructed to call the clinic with any additional questions or concerns.

## 2018-04-30 ENCOUNTER — Other Ambulatory Visit (HOSPITAL_COMMUNITY): Payer: 59

## 2018-05-01 ENCOUNTER — Ambulatory Visit (HOSPITAL_COMMUNITY): Payer: 59

## 2018-05-01 ENCOUNTER — Ambulatory Visit (HOSPITAL_COMMUNITY): Payer: 59 | Admitting: Hematology

## 2018-05-01 ENCOUNTER — Encounter (HOSPITAL_COMMUNITY): Payer: 59 | Admitting: Dietician

## 2018-05-01 ENCOUNTER — Inpatient Hospital Stay (HOSPITAL_COMMUNITY): Payer: 59 | Attending: Hematology

## 2018-05-01 DIAGNOSIS — C787 Secondary malignant neoplasm of liver and intrahepatic bile duct: Secondary | ICD-10-CM | POA: Insufficient documentation

## 2018-05-01 DIAGNOSIS — Z79899 Other long term (current) drug therapy: Secondary | ICD-10-CM | POA: Diagnosis not present

## 2018-05-01 DIAGNOSIS — Z452 Encounter for adjustment and management of vascular access device: Principal | ICD-10-CM

## 2018-05-01 DIAGNOSIS — Z5111 Encounter for antineoplastic chemotherapy: Secondary | ICD-10-CM | POA: Insufficient documentation

## 2018-05-01 DIAGNOSIS — R5383 Other fatigue: Secondary | ICD-10-CM | POA: Insufficient documentation

## 2018-05-01 DIAGNOSIS — C20 Malignant neoplasm of rectum: Secondary | ICD-10-CM | POA: Diagnosis not present

## 2018-05-01 DIAGNOSIS — R238 Other skin changes: Secondary | ICD-10-CM | POA: Insufficient documentation

## 2018-05-01 DIAGNOSIS — K5900 Constipation, unspecified: Secondary | ICD-10-CM | POA: Diagnosis not present

## 2018-05-01 DIAGNOSIS — F419 Anxiety disorder, unspecified: Secondary | ICD-10-CM | POA: Diagnosis not present

## 2018-05-01 DIAGNOSIS — Z85850 Personal history of malignant neoplasm of thyroid: Secondary | ICD-10-CM | POA: Insufficient documentation

## 2018-05-01 DIAGNOSIS — Z87891 Personal history of nicotine dependence: Secondary | ICD-10-CM | POA: Insufficient documentation

## 2018-05-01 DIAGNOSIS — Z5112 Encounter for antineoplastic immunotherapy: Secondary | ICD-10-CM | POA: Diagnosis not present

## 2018-05-01 LAB — URINALYSIS, DIPSTICK ONLY
Bilirubin Urine: NEGATIVE
Glucose, UA: NEGATIVE mg/dL
Ketones, ur: NEGATIVE mg/dL
Leukocytes, UA: NEGATIVE
Nitrite: NEGATIVE
PH: 5 (ref 5.0–8.0)
Protein, ur: NEGATIVE mg/dL
SPECIFIC GRAVITY, URINE: 1.015 (ref 1.005–1.030)

## 2018-05-01 LAB — CBC WITH DIFFERENTIAL/PLATELET
Abs Immature Granulocytes: 0.01 10*3/uL (ref 0.00–0.07)
Basophils Absolute: 0 10*3/uL (ref 0.0–0.1)
Basophils Relative: 0 %
Eosinophils Absolute: 0 10*3/uL (ref 0.0–0.5)
Eosinophils Relative: 0 %
HCT: 39.3 % (ref 36.0–46.0)
HEMOGLOBIN: 13.1 g/dL (ref 12.0–15.0)
Immature Granulocytes: 0 %
Lymphocytes Relative: 32 %
Lymphs Abs: 1.2 10*3/uL (ref 0.7–4.0)
MCH: 33.5 pg (ref 26.0–34.0)
MCHC: 33.3 g/dL (ref 30.0–36.0)
MCV: 100.5 fL — ABNORMAL HIGH (ref 80.0–100.0)
Monocytes Absolute: 0.3 10*3/uL (ref 0.1–1.0)
Monocytes Relative: 8 %
Neutro Abs: 2.2 10*3/uL (ref 1.7–7.7)
Neutrophils Relative %: 60 %
Platelets: 141 10*3/uL — ABNORMAL LOW (ref 150–400)
RBC: 3.91 MIL/uL (ref 3.87–5.11)
RDW: 17 % — ABNORMAL HIGH (ref 11.5–15.5)
WBC: 3.7 10*3/uL — ABNORMAL LOW (ref 4.0–10.5)
nRBC: 0 % (ref 0.0–0.2)

## 2018-05-01 LAB — COMPREHENSIVE METABOLIC PANEL
ALT: 35 U/L (ref 0–44)
AST: 33 U/L (ref 15–41)
Albumin: 4.7 g/dL (ref 3.5–5.0)
Alkaline Phosphatase: 69 U/L (ref 38–126)
Anion gap: 8 (ref 5–15)
BUN: 9 mg/dL (ref 6–20)
CO2: 27 mmol/L (ref 22–32)
Calcium: 9.3 mg/dL (ref 8.9–10.3)
Chloride: 105 mmol/L (ref 98–111)
Creatinine, Ser: 0.66 mg/dL (ref 0.44–1.00)
GFR calc Af Amer: >60 mL/min (ref >60)
GFR calc non Af Amer: >60 mL/min (ref >60)
Glucose, Bld: 95 mg/dL (ref 70–99)
Potassium: 3.2 mmol/L — ABNORMAL LOW (ref 3.5–5.1)
Sodium: 140 mmol/L (ref 135–145)
Total Bilirubin: 3.2 mg/dL — ABNORMAL HIGH (ref 0.3–1.2)
Total Protein: 7.9 g/dL (ref 6.5–8.1)

## 2018-05-01 LAB — MAGNESIUM: Magnesium: 2.2 mg/dL (ref 1.7–2.4)

## 2018-05-02 ENCOUNTER — Encounter (HOSPITAL_COMMUNITY): Payer: Self-pay | Admitting: Hematology

## 2018-05-02 ENCOUNTER — Other Ambulatory Visit: Payer: Self-pay

## 2018-05-02 ENCOUNTER — Inpatient Hospital Stay (HOSPITAL_COMMUNITY): Payer: 59

## 2018-05-02 ENCOUNTER — Inpatient Hospital Stay (HOSPITAL_BASED_OUTPATIENT_CLINIC_OR_DEPARTMENT_OTHER): Payer: 59 | Admitting: Hematology

## 2018-05-02 VITALS — BP 109/74 | HR 103 | Temp 98.8°F | Resp 18 | Wt 165.3 lb

## 2018-05-02 VITALS — BP 105/70 | HR 83 | Temp 98.2°F | Resp 16

## 2018-05-02 DIAGNOSIS — R238 Other skin changes: Secondary | ICD-10-CM | POA: Diagnosis not present

## 2018-05-02 DIAGNOSIS — R5383 Other fatigue: Secondary | ICD-10-CM

## 2018-05-02 DIAGNOSIS — K5900 Constipation, unspecified: Secondary | ICD-10-CM

## 2018-05-02 DIAGNOSIS — C20 Malignant neoplasm of rectum: Secondary | ICD-10-CM | POA: Diagnosis not present

## 2018-05-02 DIAGNOSIS — Z452 Encounter for adjustment and management of vascular access device: Secondary | ICD-10-CM | POA: Diagnosis not present

## 2018-05-02 DIAGNOSIS — Z87891 Personal history of nicotine dependence: Secondary | ICD-10-CM | POA: Diagnosis not present

## 2018-05-02 DIAGNOSIS — F419 Anxiety disorder, unspecified: Secondary | ICD-10-CM

## 2018-05-02 DIAGNOSIS — Z85850 Personal history of malignant neoplasm of thyroid: Secondary | ICD-10-CM | POA: Diagnosis not present

## 2018-05-02 DIAGNOSIS — Z79899 Other long term (current) drug therapy: Secondary | ICD-10-CM

## 2018-05-02 DIAGNOSIS — Z5111 Encounter for antineoplastic chemotherapy: Secondary | ICD-10-CM | POA: Diagnosis not present

## 2018-05-02 DIAGNOSIS — C787 Secondary malignant neoplasm of liver and intrahepatic bile duct: Secondary | ICD-10-CM

## 2018-05-02 DIAGNOSIS — Z5112 Encounter for antineoplastic immunotherapy: Secondary | ICD-10-CM | POA: Diagnosis not present

## 2018-05-02 MED ORDER — FLUOROURACIL CHEMO INJECTION 2.5 GM/50ML
400.0000 mg/m2 | Freq: Once | INTRAVENOUS | Status: AC
  Administered 2018-05-02: 750 mg via INTRAVENOUS
  Filled 2018-05-02: qty 15

## 2018-05-02 MED ORDER — SODIUM CHLORIDE 0.9% FLUSH
10.0000 mL | INTRAVENOUS | Status: DC | PRN
  Administered 2018-05-02: 10 mL
  Filled 2018-05-02: qty 10

## 2018-05-02 MED ORDER — SODIUM CHLORIDE 0.9 % IV SOLN
Freq: Once | INTRAVENOUS | Status: AC
  Administered 2018-05-02: 12:00:00 via INTRAVENOUS
  Filled 2018-05-02: qty 5

## 2018-05-02 MED ORDER — OXALIPLATIN CHEMO INJECTION 100 MG/20ML
68.0000 mg/m2 | Freq: Once | INTRAVENOUS | Status: AC
  Administered 2018-05-02: 130 mg via INTRAVENOUS
  Filled 2018-05-02: qty 20

## 2018-05-02 MED ORDER — PALONOSETRON HCL INJECTION 0.25 MG/5ML
0.2500 mg | Freq: Once | INTRAVENOUS | Status: AC
  Administered 2018-05-02: 0.25 mg via INTRAVENOUS
  Filled 2018-05-02: qty 5

## 2018-05-02 MED ORDER — LEUCOVORIN CALCIUM INJECTION 350 MG
800.0000 mg | Freq: Once | INTRAVENOUS | Status: AC
  Administered 2018-05-02: 800 mg via INTRAVENOUS
  Filled 2018-05-02: qty 5

## 2018-05-02 MED ORDER — DEXTROSE 5 % IV SOLN
INTRAVENOUS | Status: DC
  Administered 2018-05-02: 14:00:00 via INTRAVENOUS

## 2018-05-02 MED ORDER — SODIUM CHLORIDE 0.9 % IV SOLN
2400.0000 mg/m2 | INTRAVENOUS | Status: DC
  Administered 2018-05-02: 4650 mg via INTRAVENOUS
  Filled 2018-05-02: qty 93

## 2018-05-02 MED ORDER — SODIUM CHLORIDE 0.9 % IV SOLN
INTRAVENOUS | Status: DC
  Administered 2018-05-02: 12:00:00 via INTRAVENOUS

## 2018-05-02 MED ORDER — LACTULOSE 10 GM/15ML PO SOLN: 30.0000 g | Freq: Every evening | ORAL | 0 refills | Status: DC | PRN

## 2018-05-02 MED ORDER — SODIUM CHLORIDE 0.9 % IV SOLN
6.0000 mg/kg | Freq: Once | INTRAVENOUS | Status: AC
  Administered 2018-05-02: 480 mg via INTRAVENOUS
  Filled 2018-05-02: qty 4

## 2018-05-02 MED FILL — LACTULOSE 10 GM/15 ML SOLUT: 10 | 5 days supply | Qty: 236 | Fill #0

## 2018-05-02 NOTE — Assessment & Plan Note (Signed)
1.  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 - Colonoscopy on 02/19/2018 shows rectal mass, 2 to 4 cm from the anal verge, normal sigmoid colon.  Biopsies consistent with adenocarcinoma. - MRI of the pelvis shows rectal adenocarcinoma, T stage-T3, N stage- N1-equivocal N2 nodes.  Maximum extension beyond muscularis propria is 6 mm (advanced T3).  Circumferential resection margin is 7 mm.  Right obturator node 6 mm.  Right external iliac node at 8 mm.  Craniocaudal extent of the tumor is 4.4 cm.  Approximately 180 degree circumferential.  Shortest distance from the distal tumor to anal sphincter is 2.1 cm.  Location from anal verge, 7.2 cm to the middle of the tumor. - CT CAP on 02/21/2018 shows 9 x 7 mm left lower lobe pulmonary nodule, new since 2009.  Possible subpleural left lower lobe 3 mm nodule versus area of pleural thickening.  Vague area of hypoattenuation in the posterior right hepatic lobe measuring 1.6 cm.  Differential includes benign/malignant lesion with altered perfusion.  Focal steatosis is possible. - MRI of the liver on 03/08/2018 shows 1.4 cm lesion in the posterior right hepatic lobe.  There is a 5 mm focus of delayed hyperenhancement in the lateral segment of the left liver, questionable for a second metastatic deposit. -PET CT scan on 03/05/2018 shows hypermetabolic rectal mass.  Left perirectal hypermetabolic and sigmoid mesocolon lymph nodes.  Single hepatic metastatic lesion in the right hepatic lobe.  7 mm pulmonary nodule at the left lung base is suspicious for meta stasis. -Liver biopsy on 03/09/2018 consistent with adenocarcinoma.  - Cycle 1 FOLFOX on 03/14/2018.  She had constipation lasting for 5 days.  She also had cold sensitivity for few days.  She had some  nausea but denied any vomiting. - Cycle 2 of FOLFOX on 03/27/2018. -She was admitted to the hospital from 03/27/2018 through 03/29/2018 with neutropenic fever and sepsis.  She received IV antibiotics. 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  Low starting dose of Irinotecan is indicated. -Because of her elevated total bilirubin to above 5, cycle 3 was given on 04/18/2018 without oxaliplatin.  However vectibix was started. - She did have 1 day of severe weakness after last chemotherapy.  However she did very well without cold sensitivity. -She was evaluated by Dr. Marcille Buffy at transplant hepatology clinic at [facility].  They felt that Irinotecan should be avoided. - We have reviewed her labs.  Bilirubin has come down to her baseline.  We will proceed with cycle 4.  Patient lost about 6 pounds since start of cycle 1.  I will cut back on the dose of oxaliplatin by 20%.  She reports that cold sensitivity lasted up to 12 days after cycle 2. - We will see her back in 2 weeks for follow-up.  We will plan to repeat scans after cycle 6.  2.  EGFR antibody induced rash: -This is grade 2 with occasional sensitivity of the scalp and skin. -She had papular rash on the upper back, extremities and face including nasolabial fold. -We have started her on doxycycline 100 mg twice daily.  She is also using hydrocortisone 2% cream twice daily. -If her rash gets worse we will use fluticasone propionate cream twice  daily.   3.  Thyroid cancer: -She had a left thyroidectomy done about 6 years ago.  She is on Synthroid at this time. -We plan to check her TSH, thyroglobulin and antithyroglobulin antibodies in later part of this month.

## 2018-05-02 NOTE — Patient Instructions (Signed)
[facility] at [facility] Discharge Instructions     Thank you for choosing [facility] at [facility] to provide your oncology and hematology care.  To afford each patient quality time with our provider, please arrive at least 15 minutes before your scheduled appointment time.   If you have a lab appointment with the Cancer Center please come in thru the  Main Entrance and check in at the main information desk  You need to re-schedule your appointment should you arrive 10 or more minutes late.  We strive to give you quality time with our providers, and arriving late affects you and other patients whose appointments are after yours.  Also, if you no show three or more times for appointments you may be dismissed from the clinic at the providers discretion.     Again, thank you for choosing [facility].  Our hope is that these requests will decrease the amount of time that you wait before being seen by our physicians.       _____________________________________________________________  Should you have questions after your visit to [facility], please contact our office at [phone] between the hours of 8:00 a.m. and 4:30 p.m.  Voicemails left after 4:00 p.m. will not be returned until the following business day.  For prescription refill requests, have your pharmacy contact our office and allow 72 hours.    Cancer Center Support Programs:   > Cancer Support Group  2nd Tuesday of the month 1pm-2pm, Journey Room    

## 2018-05-02 NOTE — Progress Notes (Signed)
Labs reviewed at office visit, will given vectibix per md with folfox, decreasing the oxaliplatin per md. Will add emend as well.   Late entry:  Treatment given per orders. Patient tolerated it well without problems. Vitals stable and discharged home from clinic ambulatory. Follow up as scheduled.

## 2018-05-02 NOTE — Progress Notes (Signed)
[place], [place] [address]   CLINIC:  Medical Oncology/Hematology  PCP:  [name], MD [address] [place] [address] [phone]   REASON FOR VISIT: Follow-up for rectal cancer  CURRENT THERAPY:Folfoxand vectibix  BRIEF ONCOLOGIC HISTORY:    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[place])   02/26/2018 Initial Diagnosis    Rectal cancer ([place])    03/13/2018 Cancer Staging    Staging form: Colon and Rectum, AJCC 8th Edition - Clinical stage from 03/13/2018: Stage IVA (cT3, cN1b, cM1a) - Signed by Derek Jack, MD on 03/13/2018    03/14/2018 -  Chemotherapy    The patient had palonosetron (ALOXI) injection 0.25 mg, 0.25 mg, Intravenous,  Once, 4 of 6 cycles Administration: 0.25 mg (03/14/2018), 0.25 mg (03/27/2018), 0.25 mg (04/18/2018) leucovorin 800 mg in dextrose 5 % 250 mL infusion, 772 mg, Intravenous,  Once, 4 of 6 cycles Administration: 800 mg (03/14/2018), 800 mg (03/27/2018), 700 mg (04/18/2018) oxaliplatin (ELOXATIN) 165 mg in dextrose 5 % 500 mL chemo infusion, 85 mg/m2 = 165 mg, Intravenous,  Once, 3 of 5 cycles Dose modification: 68 mg/m2 (80 % of original dose 85 mg/m2, Cycle 4, Reason: Provider Judgment) Administration: 165 mg (03/14/2018), 165 mg (03/27/2018) panitumumab (VECTIBIX) 500 mg in sodium chloride 0.9 % 100 mL chemo infusion, 480 mg, Intravenous,  Once, 2 of 4 cycles Administration: 500 mg (04/18/2018) fluorouracil (ADRUCIL) chemo injection 750 mg, 400 mg/m2 = 750 mg (100 % of original dose 400 mg/m2), Intravenous,  Once, 3 of 5 cycles Dose modification: 400 mg/m2 (original dose 400 mg/m2, Cycle 1), 400 mg/m2 (original dose 400 mg/m2, Cycle 3) Administration: 750 mg (03/14/2018), 750 mg (04/18/2018) fosaprepitant (EMEND) 150 mg, dexamethasone (DECADRON) 12 mg in sodium chloride 0.9 % 145 mL IVPB, , Intravenous,  Once, 1 of 3 cycles fluorouracil (ADRUCIL) 4,650 mg in sodium chloride 0.9 % 57 mL chemo  infusion, 2,400 mg/m2 = 4,650 mg, Intravenous, 1 Day/Dose, 4 of 6 cycles Administration: 4,650 mg (03/14/2018), 4,650 mg (03/27/2018), 4,650 mg (04/18/2018)  for chemotherapy treatment.       CANCER STAGING: Cancer Staging Malignant neoplasm of rectum [facility]) Staging form: Colon and Rectum, AJCC 8th Edition - Clinical stage from 03/13/2018: Stage IVA (cT3, cN1b, cM1a) - Signed by Derek Jack, MD on 03/13/2018    INTERVAL HISTORY:  Samantha Reynolds 53 y.o. female returns for routine follow-up for rectal cancer. She is here with her husband today. She has a light rash on her back. That is not itchy. Her scalp is tender but no redness or rash. Also her face feels mildly sunburn and dry. She has been taking stool softeners daily and still has constipation. Denies any nausea, vomiting, or diarrhea. Denies any new pains. Had not noticed any recent bleeding such as epistaxis, hematuria or hematochezia. Denies recent chest pain on exertion, shortness of breath on minimal exertion, pre-syncopal episodes, or palpitations. Denies any numbness or tingling in hands or feet. Denies any recent fevers, infections, or recent hospitalizations. Patient reports appetite at 50% and energy level at 50%.   REVIEW OF SYSTEMS:  Review of Systems  Constitutional: Positive for fatigue.  Skin: Positive for rash.  All other systems reviewed and are negative.    PAST MEDICAL/SURGICAL HISTORY:  Past Medical History:  Diagnosis Date  . Anxiety   . Cancer [facility]) 2013   thyroid  . Endometrial polyp   . Endometrial thickening on ultra sound   . GERD (gastroesophageal reflux disease)   .  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 H/O Hashimoto thyroiditis   . 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 Hyperlipidemia 11/09/2015  . Hypothyroidism   . 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[place]) 02/26/2018     Past Surgical History:  Procedure Laterality Date  . BIOPSY  02/19/2018   Procedure: BIOPSY;  Surgeon: Rogene Houston, MD;  [facility];  Service: Endoscopy;;  rectum  . COLONOSCOPY N/A 08/20/2015   Procedure: COLONOSCOPY;  Surgeon: Rogene Houston, MD;  [facility];  Service: Endoscopy;  Laterality: N/A;  730  . [place] ENDOMETRIAL ABLATION  08-13-2004  . FLEXIBLE SIGMOIDOSCOPY N/A 02/19/2018   Procedure: FLEXIBLE SIGMOIDOSCOPY;  Surgeon: Rogene Houston, MD;  [facility];  Service: Endoscopy;  Laterality: N/A;  . HYSTEROSCOPY W/D&C N/A 04/30/2015   Procedure: DILATATION AND CURETTAGE / INTENDED HYSTEROSCOPY;  Surgeon: Dian Queen, MD;  Location: [facility];  Service: Gynecology;  Laterality: N/A;  . IR US GUIDE BX ASP/DRAIN  03/09/2018  . LAPAROSCOPIC CHOLECYSTECTOMY  04/1996  . POLYPECTOMY  08/20/2015   Procedure: POLYPECTOMY;  Surgeon: Rogene Houston, MD;  [facility];  Service: Endoscopy;;  Splenic flexure polypectomy  . POLYPECTOMY  02/19/2018   Procedure: POLYPECTOMY;  Surgeon: Rogene Houston, MD;  [facility];  Service: Endoscopy;;  rectum  . PORTACATH PLACEMENT N/A 03/14/2018   Procedure: INSERTION PORT-A-CATH;  Surgeon: Aviva Signs, MD;  Location: [facility];  Service: General;  Laterality: N/A;  . REDUCTION INCARCERATED UTERUS  06-20-2000   intrauterine preg. 13 wks /  urinary retention  . THYROID LOBECTOMY  11/24/2011   Procedure: THYROID LOBECTOMY;  Surgeon: Earnstine Regal, MD;  Location: [facility];  Service: General;  Laterality: Left;  Left Thyroid Lobectomy  . TUBAL LIGATION  2002     SOCIAL HISTORY:  Social History   Socioeconomic History  . Marital status: Married    Spouse name: Not on file  . Number of children: 4  . Years of education: Not on file  . Highest education level: Not on file  Occupational History  . Not on file  Social Needs  . Financial  resource strain: Not hard at all  . Food insecurity:    Worry: Never true    Inability: Never true  . Transportation needs:    Medical: No    Non-medical: No  Tobacco Use  . Smoking status: Former Smoker    Packs/day: 0.25    Years: 10.00    Pack years: 2.50    Types: Cigarettes    Last attempt to quit: 10/31/1991    Years since quitting: 26.5  . Smokeless tobacco: Never Used  Substance and Sexual Activity  . Alcohol use: No  . Drug use: No  . Sexual activity: Not Currently  Lifestyle  . Physical activity:    Days per week: 0 days    Minutes per session: 0 min  . Stress: To some extent  Relationships  . Social connections:    Talks on phone: More than three times a week    Gets together: Twice a week    Attends religious service: More than 4 times per year    Active member of club or organization: Yes    Attends meetings of clubs or organizations: More than  4 times per year    Relationship status: Married  . Intimate partner violence:    Fear of current or ex partner: No    Emotionally abused: No    Physically abused: No    Forced sexual activity: No  Other Topics Concern  . Not on file  Social History Narrative  . Not on file    FAMILY HISTORY:  Family History  Problem Relation Age of Onset  . Hypertension Mother   . Hypertension Father   . Prostate cancer Father   . Cancer Maternal Aunt        spine/back  . Cancer Paternal Grandfather        lung  . Healthy Son   . Healthy Son   . Healthy Son   . Healthy Son   . Colon cancer Neg Hx     CURRENT MEDICATIONS:  Outpatient Encounter Medications as of 05/02/2018  Medication Sig Note  . ALPRAZolam (XANAX) 0.5 MG tablet Take 1 tablet (0.5 mg total) by mouth 2 (two) times daily as needed for anxiety. (Patient taking differently: Take 1 mg by mouth at bedtime. *May take twice daily as needed for anxiety)   . docusate sodium (COLACE) 100 MG capsule Take 100 mg by mouth daily as needed for mild constipation.   Marland Kitchen  doxycycline (VIBRA-TABS) 100 MG tablet Take 1 tablet (100 mg total) by mouth 2 (two) times daily.   . fluorouracil CALGB 40814 in sodium chloride 0.9 % 150 mL Inject into the vein over 96 hr.   . fluticasone (CUTIVATE) 0.05 % cream Apply 1 application topically 2 (two) times daily as needed. Skin irritation/rash   . hydrocortisone cream 0.5 % Apply 1 application topically 2 (two) times daily.   Marland Kitchen leucovorin in dextrose 5 % 250 mL Inject into the vein once.   . lidocaine-prilocaine (EMLA) cream Apply small amount to port site one hour prior to appointment and cover with plastic wrap.   . prochlorperazine (COMPAZINE) 10 MG tablet Take 1 tablet (10 mg total) by mouth every 6 (six) hours as needed for nausea or vomiting.   . progesterone (PROMETRIUM) 100 MG capsule Take 100 mg by mouth at bedtime.   Marland Kitchen thyroid (ARMOUR) 90 MG tablet Take 90 mg by mouth daily before breakfast.    . [DISCONTINUED] dexamethasone (DECADRON) 4 MG tablet Take 1 tablet (4 mg total) by mouth daily. For 2 days after chemotherapy (Patient taking differently: Take 4 mg by mouth daily. For 2 days after chemotherapy. Treatments are every 2 weeks) 03/27/2018: Due for dose on 03/28/2018  . lactulose (CHRONULAC) 10 GM/15ML solution Take 45 mLs (30 g total) by mouth at bedtime as needed for mild constipation.   . [DISCONTINUED] lidocaine (XYLOCAINE) 2 % solution     No facility-administered encounter medications on file as of 05/02/2018.     ALLERGIES:  Please check her medical record.  PHYSICAL EXAM:  ECOG Performance status: 1  Vitals:   05/02/18 1009  BP: 109/74  Pulse: (!) 103  Resp: 18  Temp: 98.8 F (37.1 C)  SpO2: 100%   Filed Weights   05/02/18 1009  Weight: 165 lb 4.8 oz (75 kg)    Physical Exam Constitutional:      Appearance: Normal appearance. She is normal weight.  Cardiovascular:     Rate and Rhythm: Normal rate and regular rhythm.     Heart sounds: Normal heart sounds.  Pulmonary:     Effort:  Pulmonary effort is normal.  Breath sounds: Normal breath sounds.  Abdominal:     General: Abdomen is flat.     Palpations: Abdomen is soft.  Musculoskeletal: Normal range of motion.  Skin:    General: Skin is warm and dry.  Neurological:     Mental Status: She is alert and oriented to person, place, and time. Mental status is at baseline.  Psychiatric:        Mood and Affect: Mood normal.        Behavior: Behavior normal.        Thought Content: Thought content normal.        Judgment: Judgment normal.      LABORATORY DATA:  I have reviewed the labs as listed.  CBC    Component Value Date/Time   WBC 3.7 (L) 05/01/2018 0954   RBC 3.91 05/01/2018 0954   HGB 13.1 05/01/2018 0954   HGB 13.5 11/03/2015 0841   HCT 39.3 05/01/2018 0954   HCT 39.7 11/03/2015 0841   PLT 141 (L) 05/01/2018 0954   PLT 165 11/03/2015 0841   MCV 100.5 (H) 05/01/2018 0954   MCV 95 11/03/2015 0841   MCH 33.5 05/01/2018 0954   MCHC 33.3 05/01/2018 0954   RDW 17.0 (H) 05/01/2018 0954   RDW 13.2 11/03/2015 0841   LYMPHSABS 1.2 05/01/2018 0954   LYMPHSABS 1.4 11/03/2015 0841   MONOABS 0.3 05/01/2018 0954   EOSABS 0.0 05/01/2018 0954   EOSABS 0.1 11/03/2015 0841   BASOSABS 0.0 05/01/2018 0954   BASOSABS 0.0 11/03/2015 0841   CMP Latest Ref Rng & Units 05/01/2018 04/16/2018 04/16/2018  Glucose 70 - 99 mg/dL 95 - -  BUN 6 - 20 mg/dL 9 - -  Creatinine 0.44 - 1.00 mg/dL 0.66 - -  Sodium 135 - 145 mmol/L 140 - -  Potassium 3.5 - 5.1 mmol/L 3.2(L) - -  Chloride 98 - 111 mmol/L 105 - -  CO2 22 - 32 mmol/L 27 - -  Calcium 8.9 - 10.3 mg/dL 9.3 - -  Total Protein 6.5 - 8.1 g/dL 7.9 - -  Total Bilirubin 0.3 - 1.2 mg/dL 3.2(H) 4.6(H) 5.2(H)  Alkaline Phos 38 - 126 U/L 69 - -  AST 15 - 41 U/L 33 - -  ALT 0 - 44 U/L 35 - -       DIAGNOSTIC IMAGING:  I have independently reviewed the scans and discussed with the patient.   I have reviewed Francene Finders, NP's note and agree with the documentation.  I  personally performed a face-to-face visit, made revisions and my assessment and plan is as follows.    ASSESSMENT & PLAN:   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[facility]) 1.  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 - Colonoscopy on 02/19/2018 shows rectal mass, 2 to 4 cm from the anal verge, normal sigmoid colon.  Biopsies consistent with adenocarcinoma. - MRI of the pelvis shows rectal adenocarcinoma, T stage-T3, N stage- N1-equivocal N2 nodes.  Maximum extension beyond muscularis propria is 6 mm (advanced T3).  Circumferential resection margin is 7 mm.  Right obturator node 6 mm.  Right external iliac node at 8 mm.  Craniocaudal extent of the tumor is 4.4 cm.  Approximately 180 degree circumferential.  Shortest distance from the distal tumor to anal sphincter is 2.1 cm.  Location  from anal verge, 7.2 cm to the middle of the tumor. - CT CAP on 02/21/2018 shows 9 x 7 mm left lower lobe pulmonary nodule, new since 2009.  Possible subpleural left lower lobe 3 mm nodule versus area of pleural thickening.  Vague area of hypoattenuation in the posterior right hepatic lobe measuring 1.6 cm.  Differential includes benign/malignant lesion with altered perfusion.  Focal steatosis is possible. - MRI of the liver on 03/08/2018 shows 1.4 cm lesion in the posterior right hepatic lobe.  There is a 5 mm focus of delayed hyperenhancement in the lateral segment of the left liver, questionable for a second metastatic deposit. -PET CT scan on 03/05/2018 shows hypermetabolic rectal mass.  Left perirectal hypermetabolic and sigmoid mesocolon lymph nodes.  Single hepatic metastatic lesion in the right hepatic lobe.  7 mm pulmonary nodule at the left lung base is suspicious for meta stasis. -Liver biopsy on 03/09/2018  consistent with adenocarcinoma.  - Cycle 1 FOLFOX on 03/14/2018.  She had constipation lasting for 5 days.  She also had cold sensitivity for few days.  She had some nausea but denied any vomiting. - Cycle 2 of FOLFOX on 03/27/2018. -She was admitted to the hospital from 03/27/2018 through 03/29/2018 with neutropenic fever and sepsis.  She received IV antibiotics. 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  Low starting dose of Irinotecan is indicated. -Because of her elevated total bilirubin to above 5, cycle 3 was given on 04/18/2018 without oxaliplatin.  However vectibix was started. - She did have 1 day of severe weakness after last chemotherapy.  However she did very well without cold sensitivity. -She was evaluated by Dr. Marcille Buffy at transplant hepatology clinic at [facility]  They felt that Irinotecan should be avoided. - We have reviewed her labs.  Bilirubin has come down to her baseline.  We will proceed with cycle 4.  Patient lost about 6 pounds since start of cycle 1.  I will cut back on the dose of oxaliplatin by 20%.  She reports that cold sensitivity lasted up to 12 days after cycle 2. - We will see her back in 2 weeks for follow-up.  We will plan to repeat scans after cycle 6.  2.  EGFR antibody induced rash: -This is grade 2 with occasional sensitivity of the scalp and skin. -She had papular rash on the upper back, extremities and face including nasolabial fold. -We have started her on doxycycline 100 mg twice daily.  She is also using hydrocortisone 2% cream twice daily. -If her rash gets worse we will use fluticasone propionate cream twice daily.   3.  Thyroid cancer: -She had a left thyroidectomy done about 6 years ago.  She is on Synthroid at this time. -We plan to check her TSH, thyroglobulin and antithyroglobulin antibodies in later part of this month.      Orders placed this encounter:    Orders Placed This Encounter  Procedures  . CEA  . TSH  . Magnesium  . CBC with Differential/Platelet  . Comprehensive metabolic panel      Derek Jack, MD [place] [phone]

## 2018-05-03 ENCOUNTER — Encounter (HOSPITAL_COMMUNITY): Payer: 59

## 2018-05-03 NOTE — Patient Instructions (Signed)
[facility] Discharge Instructions for Patients Receiving Chemotherapy  Today you received the following chemotherapy agents   To help prevent nausea and vomiting after your treatment, we encourage you to take your nausea medication   If you develop nausea and vomiting that is not controlled by your nausea medication, call the clinic.   BELOW ARE SYMPTOMS THAT SHOULD BE REPORTED IMMEDIATELY:  *FEVER GREATER THAN 100.5 F  *CHILLS WITH OR WITHOUT FEVER  NAUSEA AND VOMITING THAT IS NOT CONTROLLED WITH YOUR NAUSEA MEDICATION  *UNUSUAL SHORTNESS OF BREATH  *UNUSUAL BRUISING OR BLEEDING  TENDERNESS IN MOUTH AND THROAT WITH OR WITHOUT PRESENCE OF ULCERS  *URINARY PROBLEMS  *BOWEL PROBLEMS  UNUSUAL RASH Items with * indicate a potential emergency and should be followed up as soon as possible.  Feel free to call the clinic should you have any questions or concerns. The clinic phone number is [phone].  Please show the CHEMO ALERT CARD at check-in to the Emergency Department and triage nurse.   

## 2018-05-04 ENCOUNTER — Inpatient Hospital Stay (HOSPITAL_COMMUNITY): Payer: 59

## 2018-05-04 VITALS — BP 111/65 | HR 73 | Temp 98.4°F | Resp 18

## 2018-05-04 DIAGNOSIS — Z5112 Encounter for antineoplastic immunotherapy: Secondary | ICD-10-CM | POA: Diagnosis not present

## 2018-05-04 DIAGNOSIS — R238 Other skin changes: Secondary | ICD-10-CM | POA: Diagnosis not present

## 2018-05-04 DIAGNOSIS — K5900 Constipation, unspecified: Secondary | ICD-10-CM | POA: Diagnosis not present

## 2018-05-04 DIAGNOSIS — R5383 Other fatigue: Secondary | ICD-10-CM | POA: Diagnosis not present

## 2018-05-04 DIAGNOSIS — C787 Secondary malignant neoplasm of liver and intrahepatic bile duct: Secondary | ICD-10-CM | POA: Diagnosis not present

## 2018-05-04 DIAGNOSIS — C20 Malignant neoplasm of rectum: Secondary | ICD-10-CM | POA: Diagnosis not present

## 2018-05-04 DIAGNOSIS — Z5111 Encounter for antineoplastic chemotherapy: Secondary | ICD-10-CM | POA: Diagnosis not present

## 2018-05-04 DIAGNOSIS — Z452 Encounter for adjustment and management of vascular access device: Secondary | ICD-10-CM | POA: Diagnosis not present

## 2018-05-04 DIAGNOSIS — F419 Anxiety disorder, unspecified: Secondary | ICD-10-CM | POA: Diagnosis not present

## 2018-05-04 MED ORDER — SODIUM CHLORIDE 0.9% FLUSH
10.0000 mL | INTRAVENOUS | Status: DC | PRN
  Administered 2018-05-04: 10 mL
  Filled 2018-05-04: qty 10

## 2018-05-04 MED ORDER — HEPARIN SOD (PORK) LOCK FLUSH 100 UNIT/ML IV SOLN
500.0000 [IU] | Freq: Once | INTRAVENOUS | Status: AC | PRN
  Administered 2018-05-04: 500 [IU]

## 2018-05-04 NOTE — Progress Notes (Signed)
Pt presents today for pump d/c. VSS. Pt has no complaints of pain or any changes since the last visit.  Pt denies any nausea.    No complaints at this time. Discharged from clinic ambulatory. F/U with [facility] as scheduled.

## 2018-05-04 NOTE — Patient Instructions (Signed)
[facility]  Discharge Instructions:   _______________________________________________________________  Thank you for choosing [facility] at [facility] to provide your oncology and hematology care.  To afford each patient quality time with our providers, please arrive at least 15 minutes before your scheduled appointment.  You need to re-schedule your appointment if you arrive 10 or more minutes late.  We strive to give you quality time with our providers, and arriving late affects you and other patients whose appointments are after yours.  Also, if you no show three or more times for appointments you may be dismissed from the clinic.  Again, thank you for choosing [facility] at [facility]. Our hope is that these requests will allow you access to exceptional care and in a timely manner. _______________________________________________________________  If you have questions after your visit, please contact our office at [phone] between the hours of 8:30 a.m. and 5:00 p.m. Voicemails left after 4:30 p.m. will not be returned until the following business day. _______________________________________________________________  For prescription refill requests, have your pharmacy contact our office. _______________________________________________________________  Recommendations made by the consultant and any test results will be sent to your referring physician. _______________________________________________________________ 

## 2018-05-07 DIAGNOSIS — C73 Malignant neoplasm of thyroid gland: Secondary | ICD-10-CM | POA: Diagnosis not present

## 2018-05-07 DIAGNOSIS — D511 Vitamin B12 deficiency anemia due to selective vitamin B12 malabsorption with proteinuria: Secondary | ICD-10-CM | POA: Diagnosis not present

## 2018-05-07 DIAGNOSIS — E063 Autoimmune thyroiditis: Secondary | ICD-10-CM | POA: Diagnosis not present

## 2018-05-07 DIAGNOSIS — E011 Iodine-deficiency related multinodular (endemic) goiter: Secondary | ICD-10-CM | POA: Diagnosis not present

## 2018-05-07 DIAGNOSIS — E890 Postprocedural hypothyroidism: Secondary | ICD-10-CM | POA: Diagnosis not present

## 2018-05-08 MED FILL — ARMOUR THYROID 90 MG TABLET: 90 | 90 days supply | Qty: 90 | Fill #0 | Status: TO

## 2018-05-14 MED FILL — DOXYCYCLINE HYCLATE 100 MG: 100 | 30 days supply | Qty: 60 | Fill #1 | Status: TO

## 2018-05-15 ENCOUNTER — Other Ambulatory Visit (HOSPITAL_COMMUNITY)
Admission: RE | Admit: 2018-05-15 | Discharge: 2018-05-15 | Disposition: A | Discharge status: Home / Self Care | Payer: 59 | Source: Ambulatory Visit | Attending: Hematology | Admitting: Hematology

## 2018-05-15 ENCOUNTER — Other Ambulatory Visit (HOSPITAL_COMMUNITY): Payer: 59

## 2018-05-15 DIAGNOSIS — C20 Malignant neoplasm of rectum: Secondary | ICD-10-CM | POA: Diagnosis not present

## 2018-05-15 LAB — CBC WITH DIFFERENTIAL/PLATELET
Abs Immature Granulocytes: 0 10*3/uL (ref 0.00–0.07)
Basophils Absolute: 0 10*3/uL (ref 0.0–0.1)
Basophils Relative: 1 %
EOS PCT: 0 %
Eosinophils Absolute: 0 10*3/uL (ref 0.0–0.5)
HCT: 38.5 % (ref 36.0–46.0)
HEMOGLOBIN: 12.7 g/dL (ref 12.0–15.0)
Immature Granulocytes: 0 %
LYMPHS PCT: 36 %
Lymphs Abs: 1.3 10*3/uL (ref 0.7–4.0)
MCH: 33.4 pg (ref 26.0–34.0)
MCHC: 33 g/dL (ref 30.0–36.0)
MCV: 101.3 fL — ABNORMAL HIGH (ref 80.0–100.0)
Monocytes Absolute: 0.3 10*3/uL (ref 0.1–1.0)
Monocytes Relative: 9 %
Neutro Abs: 2 10*3/uL (ref 1.7–7.7)
Neutrophils Relative %: 54 %
Platelets: 113 10*3/uL — ABNORMAL LOW (ref 150–400)
RBC: 3.8 MIL/uL — ABNORMAL LOW (ref 3.87–5.11)
RDW: 16.5 % — ABNORMAL HIGH (ref 11.5–15.5)
WBC: 3.6 10*3/uL — ABNORMAL LOW (ref 4.0–10.5)
nRBC: 0 % (ref 0.0–0.2)

## 2018-05-15 LAB — COMPREHENSIVE METABOLIC PANEL
ALT: 45 U/L — ABNORMAL HIGH (ref 0–44)
AST: 49 U/L — ABNORMAL HIGH (ref 15–41)
Albumin: 4.3 g/dL (ref 3.5–5.0)
Alkaline Phosphatase: 69 U/L (ref 38–126)
Anion gap: 7 (ref 5–15)
BUN: 8 mg/dL (ref 6–20)
CHLORIDE: 106 mmol/L (ref 98–111)
CO2: 25 mmol/L (ref 22–32)
Calcium: 9 mg/dL (ref 8.9–10.3)
Creatinine, Ser: 0.62 mg/dL (ref 0.44–1.00)
GFR calc Af Amer: >60 mL/min (ref >60)
Glucose, Bld: 99 mg/dL (ref 70–99)
Potassium: 3.2 mmol/L — ABNORMAL LOW (ref 3.5–5.1)
Sodium: 138 mmol/L (ref 135–145)
Total Bilirubin: 1.9 mg/dL — ABNORMAL HIGH (ref 0.3–1.2)
Total Protein: 7.1 g/dL (ref 6.5–8.1)

## 2018-05-15 LAB — MAGNESIUM: Magnesium: 2.1 mg/dL (ref 1.7–2.4)

## 2018-05-16 ENCOUNTER — Encounter (HOSPITAL_COMMUNITY): Payer: Self-pay | Admitting: Hematology

## 2018-05-16 ENCOUNTER — Inpatient Hospital Stay (HOSPITAL_COMMUNITY): Payer: 59

## 2018-05-16 ENCOUNTER — Inpatient Hospital Stay (HOSPITAL_BASED_OUTPATIENT_CLINIC_OR_DEPARTMENT_OTHER): Payer: 59 | Admitting: Hematology

## 2018-05-16 ENCOUNTER — Encounter (HOSPITAL_COMMUNITY): Payer: Self-pay

## 2018-05-16 VITALS — BP 106/70 | HR 92 | Temp 98.0°F | Resp 18 | Wt 166.4 lb

## 2018-05-16 DIAGNOSIS — K5900 Constipation, unspecified: Secondary | ICD-10-CM | POA: Diagnosis not present

## 2018-05-16 DIAGNOSIS — Z85850 Personal history of malignant neoplasm of thyroid: Secondary | ICD-10-CM | POA: Diagnosis not present

## 2018-05-16 DIAGNOSIS — Z87891 Personal history of nicotine dependence: Secondary | ICD-10-CM

## 2018-05-16 DIAGNOSIS — C20 Malignant neoplasm of rectum: Secondary | ICD-10-CM | POA: Diagnosis not present

## 2018-05-16 DIAGNOSIS — F419 Anxiety disorder, unspecified: Secondary | ICD-10-CM | POA: Diagnosis not present

## 2018-05-16 DIAGNOSIS — Z79899 Other long term (current) drug therapy: Secondary | ICD-10-CM

## 2018-05-16 DIAGNOSIS — Z5112 Encounter for antineoplastic immunotherapy: Secondary | ICD-10-CM | POA: Diagnosis not present

## 2018-05-16 DIAGNOSIS — C787 Secondary malignant neoplasm of liver and intrahepatic bile duct: Secondary | ICD-10-CM | POA: Diagnosis not present

## 2018-05-16 DIAGNOSIS — R238 Other skin changes: Secondary | ICD-10-CM

## 2018-05-16 DIAGNOSIS — Z452 Encounter for adjustment and management of vascular access device: Secondary | ICD-10-CM | POA: Diagnosis not present

## 2018-05-16 DIAGNOSIS — Z5111 Encounter for antineoplastic chemotherapy: Secondary | ICD-10-CM | POA: Diagnosis not present

## 2018-05-16 DIAGNOSIS — R5383 Other fatigue: Secondary | ICD-10-CM | POA: Diagnosis not present

## 2018-05-16 DIAGNOSIS — E876 Hypokalemia: Secondary | ICD-10-CM

## 2018-05-16 LAB — CEA: CEA: 1.5 ng/mL (ref 0.0–4.7)

## 2018-05-16 MED ORDER — LEUCOVORIN CALCIUM INJECTION 350 MG
800.0000 mg | Freq: Once | INTRAVENOUS | Status: AC
  Administered 2018-05-16: 800 mg via INTRAVENOUS
  Filled 2018-05-16: qty 35

## 2018-05-16 MED ORDER — SODIUM CHLORIDE 0.9 % IV SOLN
6.0000 mg/kg | Freq: Once | INTRAVENOUS | Status: AC
  Administered 2018-05-16: 480 mg via INTRAVENOUS
  Filled 2018-05-16: qty 4

## 2018-05-16 MED ORDER — OXALIPLATIN CHEMO INJECTION 100 MG/20ML
68.0000 mg/m2 | Freq: Once | INTRAVENOUS | Status: AC
  Administered 2018-05-16: 130 mg via INTRAVENOUS
  Filled 2018-05-16: qty 6

## 2018-05-16 MED ORDER — PALONOSETRON HCL INJECTION 0.25 MG/5ML
0.2500 mg | Freq: Once | INTRAVENOUS | Status: AC
  Administered 2018-05-16: 0.25 mg via INTRAVENOUS
  Filled 2018-05-16: qty 5

## 2018-05-16 MED ORDER — SODIUM CHLORIDE 0.9 % IV SOLN
2400.0000 mg/m2 | INTRAVENOUS | Status: DC
  Administered 2018-05-16: 4650 mg via INTRAVENOUS
  Filled 2018-05-16: qty 93

## 2018-05-16 MED ORDER — DEXTROSE 5 % IV SOLN
Freq: Once | INTRAVENOUS | Status: AC
  Administered 2018-05-16: 12:00:00 via INTRAVENOUS

## 2018-05-16 MED ORDER — FLUOROURACIL CHEMO INJECTION 2.5 GM/50ML
400.0000 mg/m2 | Freq: Once | INTRAVENOUS | Status: AC
  Administered 2018-05-16: 750 mg via INTRAVENOUS
  Filled 2018-05-16: qty 15

## 2018-05-16 MED ORDER — SODIUM CHLORIDE 0.9% FLUSH
10.0000 mL | INTRAVENOUS | Status: DC | PRN
  Administered 2018-05-16: 10 mL
  Filled 2018-05-16: qty 10

## 2018-05-16 MED ORDER — POTASSIUM CHLORIDE CRYS ER 20 MEQ PO TBCR
40.0000 meq | EXTENDED_RELEASE_TABLET | Freq: Once | ORAL | Status: AC
  Administered 2018-05-16: 40 meq via ORAL
  Filled 2018-05-16: qty 2

## 2018-05-16 MED ORDER — SODIUM CHLORIDE 0.9 % IV SOLN
INTRAVENOUS | Status: DC
  Administered 2018-05-16: 09:00:00 via INTRAVENOUS

## 2018-05-16 MED ORDER — SODIUM CHLORIDE 0.9 % IV SOLN
Freq: Once | INTRAVENOUS | Status: AC
  Administered 2018-05-16: 10:00:00 via INTRAVENOUS
  Filled 2018-05-16: qty 5

## 2018-05-16 NOTE — Progress Notes (Signed)
[place], [place] [address]   CLINIC:  Medical Oncology/Hematology  PCP:  [name], MD [address] [place] [address] [phone]   REASON FOR VISIT: Follow-up for rectal cancer  CURRENT THERAPY:Folfoxand vectibix  BRIEF ONCOLOGIC HISTORY:    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[place])   02/26/2018 Initial Diagnosis    Rectal cancer ([place])    03/13/2018 Cancer Staging    Staging form: Colon and Rectum, AJCC 8th Edition - Clinical stage from 03/13/2018: Stage IVA (cT3, cN1b, cM1a) - Signed by Derek Jack, MD on 03/13/2018    03/14/2018 -  Chemotherapy    The patient had palonosetron (ALOXI) injection 0.25 mg, 0.25 mg, Intravenous,  Once, 5 of 8 cycles Administration: 0.25 mg (03/14/2018), 0.25 mg (03/27/2018), 0.25 mg (04/18/2018), 0.25 mg (05/02/2018) leucovorin 800 mg in dextrose 5 % 250 mL infusion, 772 mg, Intravenous,  Once, 5 of 8 cycles Administration: 800 mg (03/14/2018), 800 mg (03/27/2018), 800 mg (05/02/2018), 700 mg (04/18/2018) oxaliplatin (ELOXATIN) 165 mg in dextrose 5 % 500 mL chemo infusion, 85 mg/m2 = 165 mg, Intravenous,  Once, 4 of 7 cycles Dose modification: 68 mg/m2 (80 % of original dose 85 mg/m2, Cycle 4, Reason: Provider Judgment) Administration: 165 mg (03/14/2018), 165 mg (03/27/2018), 130 mg (05/02/2018) panitumumab (VECTIBIX) 500 mg in sodium chloride 0.9 % 100 mL chemo infusion, 480 mg, Intravenous,  Once, 3 of 6 cycles Administration: 500 mg (04/18/2018), 480 mg (05/02/2018) fluorouracil (ADRUCIL) chemo injection 750 mg, 400 mg/m2 = 750 mg (100 % of original dose 400 mg/m2), Intravenous,  Once, 4 of 7 cycles Dose modification: 400 mg/m2 (original dose 400 mg/m2, Cycle 1), 400 mg/m2 (original dose 400 mg/m2, Cycle 3) Administration: 750 mg (03/14/2018), 750 mg (04/18/2018), 750 mg (05/02/2018) fosaprepitant (EMEND) 150 mg, dexamethasone (DECADRON) 12 mg in sodium chloride 0.9 % 145 mL IVPB, ,  Intravenous,  Once, 2 of 5 cycles Administration:  (05/02/2018) fluorouracil (ADRUCIL) 4,650 mg in sodium chloride 0.9 % 57 mL chemo infusion, 2,400 mg/m2 = 4,650 mg, Intravenous, 1 Day/Dose, 5 of 8 cycles Administration: 4,650 mg (03/14/2018), 4,650 mg (03/27/2018), 4,650 mg (04/18/2018), 4,650 mg (05/02/2018)  for chemotherapy treatment.       CANCER STAGING: Cancer Staging Malignant neoplasm of rectum [facility]) Staging form: Colon and Rectum, AJCC 8th Edition - Clinical stage from 03/13/2018: Stage IVA (cT3, cN1b, cM1a) - Signed by Derek Jack, MD on 03/13/2018    INTERVAL HISTORY:  Ms. Lilja 53 y.o. female returns for routine follow-up for rectal cancer. She is here today with her husband. She is having burning in her scalp. She is also having dry skin all over her body and scalp. She is using lotions twice a day and it has helped. She also reports having a small amount of blood when she has a bowel movement but it is due to being dry and cracked. She is also constipated. Since we cut back on her treatment dose her numbness in her finger went away within the week after treatment. She also had less nausea. Denies any nausea, vomiting, or diarrhea. Denies any new pains. Had not noticed any recent bleeding such as epistaxis, hematuria or hematochezia. Denies recent chest pain on exertion, shortness of breath on minimal exertion, pre-syncopal episodes, or palpitations. Denies any recent fevers, infections, or recent hospitalizations. Patient reports appetite at 75% and energy level at 50%. She is maintaining her weight at this time.     REVIEW OF SYSTEMS:  Review of Systems  Constitutional: Positive for fatigue.  Gastrointestinal: Positive for constipation.  Skin: Positive for itching (dry skin).  All other systems reviewed and are negative.    PAST MEDICAL/SURGICAL HISTORY:  Past Medical History:  Diagnosis Date  . Anxiety   . Cancer [facility]) 2013   thyroid  . Endometrial polyp     . Endometrial thickening on ultra sound   . GERD (gastroesophageal reflux disease)   . 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 H/O Hashimoto thyroiditis   . 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 Hyperlipidemia 11/09/2015  . Hypothyroidism   . 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[place]) 02/26/2018   Past Surgical History:  Procedure Laterality Date  . BIOPSY  02/19/2018   Procedure: BIOPSY;  Surgeon: Rogene Houston, MD;  [facility];  Service: Endoscopy;;  rectum  . COLONOSCOPY N/A 08/20/2015   Procedure: COLONOSCOPY;  Surgeon: Rogene Houston, MD;  [facility];  Service: Endoscopy;  Laterality: N/A;  [address]  . [place] ENDOMETRIAL ABLATION  08-13-2004  . FLEXIBLE SIGMOIDOSCOPY N/A 02/19/2018   Procedure: FLEXIBLE SIGMOIDOSCOPY;  Surgeon: Rogene Houston, MD;  [facility];  Service: Endoscopy;  Laterality: N/A;  . HYSTEROSCOPY W/D&C N/A 04/30/2015   Procedure: DILATATION AND CURETTAGE / INTENDED HYSTEROSCOPY;  Surgeon: Dian Queen, MD;  Location: [facility];  Service: Gynecology;  Laterality: N/A;  . IR US GUIDE BX ASP/DRAIN  03/09/2018  . LAPAROSCOPIC CHOLECYSTECTOMY  04/1996  . POLYPECTOMY  08/20/2015   Procedure: POLYPECTOMY;  Surgeon: Rogene Houston, MD;  [facility];  Service: Endoscopy;;  Splenic flexure polypectomy  . POLYPECTOMY  02/19/2018   Procedure: POLYPECTOMY;  Surgeon: Rogene Houston, MD;  [facility];  Service: Endoscopy;;  rectum  . PORTACATH PLACEMENT N/A 03/14/2018   Procedure: INSERTION PORT-A-CATH;  Surgeon: Aviva Signs, MD;  Location: [facility];  Service: General;  Laterality: N/A;  . REDUCTION INCARCERATED UTERUS  06-20-2000   intrauterine preg. 13 wks /  urinary retention  . THYROID LOBECTOMY  11/24/2011   Procedure: THYROID LOBECTOMY;   Surgeon: Earnstine Regal, MD;  Location: [facility];  Service: General;  Laterality: Left;  Left Thyroid Lobectomy  . TUBAL LIGATION  2002     SOCIAL HISTORY:  Social History   Socioeconomic History  . Marital status: Married    Spouse name: Not on file  . Number of children: 4  . Years of education: Not on file  . Highest education level: Not on file  Occupational History  . Not on file  Social Needs  . Financial resource strain: Not hard at all  . Food insecurity:    Worry: Never true    Inability: Never true  . Transportation needs:    Medical: No    Non-medical: No  Tobacco Use  . Smoking status: Former Smoker    Packs/day: 0.25    Years: 10.00    Pack years: 2.50    Types: Cigarettes    Last attempt to quit: 10/31/1991    Years since quitting: 26.5  . Smokeless tobacco: Never Used  Substance and Sexual Activity  . Alcohol use: No  . Drug use: No  . Sexual activity: Not Currently  Lifestyle  . Physical activity:    Days per week:  0 days    Minutes per session: 0 min  . Stress: To some extent  Relationships  . Social connections:    Talks on phone: More than three times a week    Gets together: Twice a week    Attends religious service: More than 4 times per year    Active member of club or organization: Yes    Attends meetings of clubs or organizations: More than 4 times per year    Relationship status: Married  . Intimate partner violence:    Fear of current or ex partner: No    Emotionally abused: No    Physically abused: No    Forced sexual activity: No  Other Topics Concern  . Not on file  Social History Narrative  . Not on file    FAMILY HISTORY:  Family History  Problem Relation Age of Onset  . Hypertension Mother   . Hypertension Father   . Prostate cancer Father   . Cancer Maternal Aunt        spine/back  . Cancer Paternal Grandfather        lung  . Healthy Son   . Healthy Son   . Healthy Son   . Healthy Son   . Colon cancer Neg Hx      CURRENT MEDICATIONS:  Outpatient Encounter Medications as of 05/16/2018  Medication Sig  . ALPRAZolam (XANAX) 0.5 MG tablet Take 1 tablet (0.5 mg total) by mouth 2 (two) times daily as needed for anxiety. (Patient taking differently: Take 1 mg by mouth at bedtime. *May take twice daily as needed for anxiety)  . docusate sodium (COLACE) 100 MG capsule Take 100 mg by mouth daily as needed for mild constipation.  Marland Kitchen doxycycline (VIBRA-TABS) 100 MG tablet Take 1 tablet (100 mg total) by mouth 2 (two) times daily.  . fluorouracil CALGB 28413 in sodium chloride 0.9 % 150 mL Inject into the vein over 96 hr.  . fluticasone (CUTIVATE) 0.05 % cream Apply 1 application topically 2 (two) times daily as needed. Skin irritation/rash  . hydrocortisone cream 0.5 % Apply 1 application topically 2 (two) times daily.  Marland Kitchen lactulose (CHRONULAC) 10 GM/15ML solution Take 45 mLs (30 g total) by mouth at bedtime as needed for mild constipation.  Marland Kitchen leucovorin in dextrose 5 % 250 mL Inject into the vein once.  . lidocaine-prilocaine (EMLA) cream Apply small amount to port site one hour prior to appointment and cover with plastic wrap.  . prochlorperazine (COMPAZINE) 10 MG tablet Take 1 tablet (10 mg total) by mouth every 6 (six) hours as needed for nausea or vomiting.  . progesterone (PROMETRIUM) 100 MG capsule Take 100 mg by mouth at bedtime.  Marland Kitchen thyroid (ARMOUR) 90 MG tablet Take 90 mg by mouth daily before breakfast.    No facility-administered encounter medications on file as of 05/16/2018.     ALLERGIES:  Please see allergy list in the EMR.  PHYSICAL EXAM:  ECOG Performance status: 1  Vitals:   05/16/18 0838  BP: 119/71  Pulse: 95  Resp: 18  Temp: 98.1 F (36.7 C)  SpO2: 100%   Filed Weights   05/16/18 0838  Weight: 166 lb 6.4 oz (75.5 kg)    Physical Exam Constitutional:      Appearance: Normal appearance. She is normal weight.  Cardiovascular:     Rate and Rhythm: Normal rate and regular  rhythm.     Heart sounds: Normal heart sounds.  Pulmonary:     Effort:  Pulmonary effort is normal.     Breath sounds: Normal breath sounds.  Musculoskeletal: Normal range of motion.  Skin:    General: Skin is warm and dry.  Neurological:     Mental Status: She is alert and oriented to person, place, and time. Mental status is at baseline.  Psychiatric:        Mood and Affect: Mood normal.        Behavior: Behavior normal.        Thought Content: Thought content normal.        Judgment: Judgment normal.      LABORATORY DATA:  I have reviewed the labs as listed.  CBC    Component Value Date/Time   WBC 3.6 (L) 05/15/2018 0900   RBC 3.80 (L) 05/15/2018 0900   HGB 12.7 05/15/2018 0900   HGB 13.5 11/03/2015 0841   HCT 38.5 05/15/2018 0900   HCT 39.7 11/03/2015 0841   PLT 113 (L) 05/15/2018 0900   PLT 165 11/03/2015 0841   MCV 101.3 (H) 05/15/2018 0900   MCV 95 11/03/2015 0841   MCH 33.4 05/15/2018 0900   MCHC 33.0 05/15/2018 0900   RDW 16.5 (H) 05/15/2018 0900   RDW 13.2 11/03/2015 0841   LYMPHSABS 1.3 05/15/2018 0900   LYMPHSABS 1.4 11/03/2015 0841   MONOABS 0.3 05/15/2018 0900   EOSABS 0.0 05/15/2018 0900   EOSABS 0.1 11/03/2015 0841   BASOSABS 0.0 05/15/2018 0900   BASOSABS 0.0 11/03/2015 0841   CMP Latest Ref Rng & Units 05/15/2018 05/01/2018 04/16/2018  Glucose 70 - 99 mg/dL 99 95 -  BUN 6 - 20 mg/dL 8 9 -  Creatinine 0.44 - 1.00 mg/dL 0.62 0.66 -  Sodium 135 - 145 mmol/L 138 140 -  Potassium 3.5 - 5.1 mmol/L 3.2(L) 3.2(L) -  Chloride 98 - 111 mmol/L 106 105 -  CO2 22 - 32 mmol/L 25 27 -  Calcium 8.9 - 10.3 mg/dL 9.0 9.3 -  Total Protein 6.5 - 8.1 g/dL 7.1 7.9 -  Total Bilirubin 0.3 - 1.2 mg/dL 1.9(H) 3.2(H) 4.6(H)  Alkaline Phos 38 - 126 U/L 69 69 -  AST 15 - 41 U/L 49(H) 33 -  ALT 0 - 44 U/L 45(H) 35 -       DIAGNOSTIC IMAGING:  I have independently reviewed the scans and discussed with the patient.   I have reviewed Francene Finders, NP's note and agree  with the documentation.  I personally performed a face-to-face visit, made revisions and my assessment and plan is as follows.    ASSESSMENT & PLAN:   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[facility]) 1.  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 - Colonoscopy on 02/19/2018 shows rectal mass, 2 to 4 cm from the anal verge, normal sigmoid colon.  Biopsies consistent with adenocarcinoma. - MRI of the pelvis shows rectal adenocarcinoma, T stage-T3, N stage- N1-equivocal N2 nodes.  Maximum extension beyond muscularis propria is 6 mm (advanced T3).  Circumferential resection margin is 7 mm.  Right obturator node 6 mm.  Right external iliac node at 8 mm.  Craniocaudal extent of the tumor is 4.4 cm.  Approximately 180 degree circumferential.  Shortest distance from the distal tumor to anal sphincter is 2.1 cm.  Location from anal verge, 7.2 cm to the middle of  the tumor. - CT CAP on 02/21/2018 shows 9 x 7 mm left lower lobe pulmonary nodule, new since 2009.  Possible subpleural left lower lobe 3 mm nodule versus area of pleural thickening.  Vague area of hypoattenuation in the posterior right hepatic lobe measuring 1.6 cm.  Differential includes benign/malignant lesion with altered perfusion.  Focal steatosis is possible. - MRI of the liver on 03/08/2018 shows 1.4 cm lesion in the posterior right hepatic lobe.  There is a 5 mm focus of delayed hyperenhancement in the lateral segment of the left liver, questionable for a second metastatic deposit. -PET CT scan on 03/05/2018 shows hypermetabolic rectal mass.  Left perirectal hypermetabolic and sigmoid mesocolon lymph nodes.  Single hepatic metastatic lesion in the right hepatic lobe.  7 mm pulmonary nodule at the left lung base is suspicious for meta  stasis. -Liver biopsy on 03/09/2018 consistent with adenocarcinoma.  - Cycle 1 FOLFOX on 03/14/2018.  She had constipation lasting for 5 days.  She also had cold sensitivity for few days.  She had some nausea but denied any vomiting. - Cycle 2 of FOLFOX on 03/27/2018. -She was admitted to the hospital from 03/27/2018 through 03/29/2018 with neutropenic fever and sepsis.  She received IV antibiotics. 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  Low starting dose of Irinotecan is indicated. -Because of her elevated total bilirubin to above 5, cycle 3 was given on 04/18/2018 without oxaliplatin.  However vectibix was started. - She did have 1 day of severe weakness after last chemotherapy.  However she did very well without cold sensitivity. -She was evaluated by Dr. Marcille Buffy at transplant hepatology clinic at [facility].  They felt that Irinotecan should be avoided. - Cycle 4 was on 05/02/2018 with oxaliplatin dose reduction by 20%. -She tolerated her last cycle reasonably well.  She had cold sensitivity lasting for 1 week.  Denies any tingling or numbness in extremities.  She had nausea for couple of days which was controlled with Compazine. -She may proceed with cycle 5 at the same dose level.  She will come back in 3 weeks for follow-up.  She is taking 1 week off to visit [place]. - I plan to repeat her scans after 6 cycles.  2.  EGFR antibody induced rash: -She has grade 2 rash with occasional sensitivity of the scalp and skin. -She also has papular rash on the upper back, extremities and face including nasolabial fold. -She is continuing doxycycline 100 mg twice daily.  She is also using hydrocortisone 2% cream twice daily. -She was recommended to use oil-based moisturizing creams like Vaseline and udder  Cream.   3.  Thyroid cancer: -She had a left thyroidectomy done about 6 years ago.  She is on Synthroid at this  time. -TSH, thyroglobulin and antithyroglobulin antibodies were normal.      Orders placed this encounter:  No orders of the defined types were placed in this encounter.     Derek Jack, MD [place] [phone]

## 2018-05-16 NOTE — Assessment & Plan Note (Signed)
1.  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 - Colonoscopy on 02/19/2018 shows rectal mass, 2 to 4 cm from the anal verge, normal sigmoid colon.  Biopsies consistent with adenocarcinoma. - MRI of the pelvis shows rectal adenocarcinoma, T stage-T3, N stage- N1-equivocal N2 nodes.  Maximum extension beyond muscularis propria is 6 mm (advanced T3).  Circumferential resection margin is 7 mm.  Right obturator node 6 mm.  Right external iliac node at 8 mm.  Craniocaudal extent of the tumor is 4.4 cm.  Approximately 180 degree circumferential.  Shortest distance from the distal tumor to anal sphincter is 2.1 cm.  Location from anal verge, 7.2 cm to the middle of the tumor. - CT CAP on 02/21/2018 shows 9 x 7 mm left lower lobe pulmonary nodule, new since 2009.  Possible subpleural left lower lobe 3 mm nodule versus area of pleural thickening.  Vague area of hypoattenuation in the posterior right hepatic lobe measuring 1.6 cm.  Differential includes benign/malignant lesion with altered perfusion.  Focal steatosis is possible. - MRI of the liver on 03/08/2018 shows 1.4 cm lesion in the posterior right hepatic lobe.  There is a 5 mm focus of delayed hyperenhancement in the lateral segment of the left liver, questionable for a second metastatic deposit. -PET CT scan on 03/05/2018 shows hypermetabolic rectal mass.  Left perirectal hypermetabolic and sigmoid mesocolon lymph nodes.  Single hepatic metastatic lesion in the right hepatic lobe.  7 mm pulmonary nodule at the left lung base is suspicious for meta stasis. -Liver biopsy on 03/09/2018 consistent with adenocarcinoma.  - Cycle 1 FOLFOX on 03/14/2018.  She had constipation lasting for 5 days.  She also had cold sensitivity for few days.  She had some  nausea but denied any vomiting. - Cycle 2 of FOLFOX on 03/27/2018. -She was admitted to the hospital from 03/27/2018 through 03/29/2018 with neutropenic fever and sepsis.  She received IV antibiotics. 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  Low starting dose of Irinotecan is indicated. -Because of her elevated total bilirubin to above 5, cycle 3 was given on 04/18/2018 without oxaliplatin.  However vectibix was started. - She did have 1 day of severe weakness after last chemotherapy.  However she did very well without cold sensitivity. -She was evaluated by Dr. Marcille Buffy at transplant hepatology clinic at [facility].  They felt that Irinotecan should be avoided. - Cycle 4 was on 05/02/2018 with oxaliplatin dose reduction by 20%. -She tolerated her last cycle reasonably well.  She had cold sensitivity lasting for 1 week.  Denies any tingling or numbness in extremities.  She had nausea for couple of days which was controlled with Compazine. -She may proceed with cycle 5 at the same dose level.  She will come back in 3 weeks for follow-up.  She is taking 1 week off to visit [place]. - I plan to repeat her scans after 6 cycles.  2.  EGFR antibody induced rash: -She has grade 2 rash with occasional sensitivity of the scalp and skin. -She also has papular rash on the upper back, extremities and face including nasolabial fold. -She is continuing doxycycline 100 mg twice daily.  She is also using hydrocortisone 2% cream twice daily. -She  was recommended to use oil-based moisturizing creams like Vaseline and udder  Cream.   3.  Thyroid cancer: -She had a left thyroidectomy done about 6 years ago.  She is on Synthroid at this time. -TSH, thyroglobulin and antithyroglobulin antibodies were normal.

## 2018-05-16 NOTE — Patient Instructions (Signed)
[facility] at [facility] Discharge Instructions     Thank you for choosing [facility] at [facility] to provide your oncology and hematology care.  To afford each patient quality time with our provider, please arrive at least 15 minutes before your scheduled appointment time.   If you have a lab appointment with the Cancer Center please come in thru the  Main Entrance and check in at the main information desk  You need to re-schedule your appointment should you arrive 10 or more minutes late.  We strive to give you quality time with our providers, and arriving late affects you and other patients whose appointments are after yours.  Also, if you no show three or more times for appointments you may be dismissed from the clinic at the providers discretion.     Again, thank you for choosing [facility].  Our hope is that these requests will decrease the amount of time that you wait before being seen by our physicians.       _____________________________________________________________  Should you have questions after your visit to [facility], please contact our office at [phone] between the hours of 8:00 a.m. and 4:30 p.m.  Voicemails left after 4:00 p.m. will not be returned until the following business day.  For prescription refill requests, have your pharmacy contact our office and allow 72 hours.    Cancer Center Support Programs:   > Cancer Support Group  2nd Tuesday of the month 1pm-2pm, Journey Room    

## 2018-05-16 NOTE — Addendum Note (Signed)
Addended by: Glennie Isle on: 05/16/2018 01:17 PM   Modules accepted: Orders

## 2018-05-16 NOTE — Progress Notes (Signed)
Patent seen by Dr. Delton Coombes with lab review with ok to treat today verbal order.    Patient tolerated chemotherapy with no complaints voiced.  Port site clean and dry with no bruising or swelling noted at site.  Good blood return noted before and after administration of chemotherapy.  Tegaderm dressing intact.  Chemotherapy pump connected with no alarms noted.   Patient left ambulatory with VSS and no s/s of distress noted.

## 2018-05-16 NOTE — Patient Instructions (Signed)
[facility] Discharge Instructions for Patients Receiving Chemotherapy  Today you received the following chemotherapy agents  If you develop nausea and vomiting that is not controlled by your nausea medication, call the clinic.   BELOW ARE SYMPTOMS THAT SHOULD BE REPORTED IMMEDIATELY:  *FEVER GREATER THAN 100.5 F  *CHILLS WITH OR WITHOUT FEVER  NAUSEA AND VOMITING THAT IS NOT CONTROLLED WITH YOUR NAUSEA MEDICATION  *UNUSUAL SHORTNESS OF BREATH  *UNUSUAL BRUISING OR BLEEDING  TENDERNESS IN MOUTH AND THROAT WITH OR WITHOUT PRESENCE OF ULCERS  *URINARY PROBLEMS  *BOWEL PROBLEMS  UNUSUAL RASH Items with * indicate a potential emergency and should be followed up as soon as possible.  Feel free to call the clinic should you have any questions or concerns. The clinic phone number is [phone].  Please show the CHEMO ALERT CARD at check-in to the Emergency Department and triage nurse.   

## 2018-05-18 ENCOUNTER — Inpatient Hospital Stay (HOSPITAL_COMMUNITY): Payer: 59

## 2018-05-18 ENCOUNTER — Encounter: Payer: Self-pay | Admitting: Family Medicine

## 2018-05-18 ENCOUNTER — Encounter (HOSPITAL_COMMUNITY): Payer: Self-pay

## 2018-05-18 VITALS — BP 103/69 | HR 80 | Temp 98.5°F | Resp 18

## 2018-05-18 DIAGNOSIS — Z5111 Encounter for antineoplastic chemotherapy: Secondary | ICD-10-CM | POA: Diagnosis not present

## 2018-05-18 DIAGNOSIS — C787 Secondary malignant neoplasm of liver and intrahepatic bile duct: Secondary | ICD-10-CM | POA: Diagnosis not present

## 2018-05-18 DIAGNOSIS — Z452 Encounter for adjustment and management of vascular access device: Secondary | ICD-10-CM | POA: Diagnosis not present

## 2018-05-18 DIAGNOSIS — R5383 Other fatigue: Secondary | ICD-10-CM | POA: Diagnosis not present

## 2018-05-18 DIAGNOSIS — F419 Anxiety disorder, unspecified: Secondary | ICD-10-CM | POA: Diagnosis not present

## 2018-05-18 DIAGNOSIS — K5900 Constipation, unspecified: Secondary | ICD-10-CM | POA: Diagnosis not present

## 2018-05-18 DIAGNOSIS — C20 Malignant neoplasm of rectum: Secondary | ICD-10-CM | POA: Diagnosis not present

## 2018-05-18 DIAGNOSIS — R238 Other skin changes: Secondary | ICD-10-CM | POA: Diagnosis not present

## 2018-05-18 DIAGNOSIS — Z5112 Encounter for antineoplastic immunotherapy: Secondary | ICD-10-CM | POA: Diagnosis not present

## 2018-05-18 MED ORDER — SODIUM CHLORIDE 0.9% FLUSH
10.0000 mL | INTRAVENOUS | Status: DC | PRN
  Administered 2018-05-18: 10 mL
  Filled 2018-05-18: qty 10

## 2018-05-18 MED ORDER — HEPARIN SOD (PORK) LOCK FLUSH 100 UNIT/ML IV SOLN
500.0000 [IU] | Freq: Once | INTRAVENOUS | Status: AC | PRN
  Administered 2018-05-18: 500 [IU]

## 2018-05-18 NOTE — Progress Notes (Signed)
Samantha Reynolds tolerated 5FU pump well without complaints or incident. 5FU pump discontinued with portacath flushed with 10 ml NS and 5 ml Heparin easily per protocol then de-accessed. VSS Pt discharged self ambulatory in satisfactory condition accompanied by her husband

## 2018-05-18 NOTE — Patient Instructions (Signed)
[facility] at [facility] Discharge Instructions  5FU pump discontinued with portacath flushed per protocol. Follow-up as scheduled. Call clinic for any questions or concerns   Thank you for choosing [facility] at [facility] to provide your oncology and hematology care.  To afford each patient quality time with our provider, please arrive at least 15 minutes before your scheduled appointment time.   If you have a lab appointment with the Cancer Center please come in thru the  Main Entrance and check in at the main information desk  You need to re-schedule your appointment should you arrive 10 or more minutes late.  We strive to give you quality time with our providers, and arriving late affects you and other patients whose appointments are after yours.  Also, if you no show three or more times for appointments you may be dismissed from the clinic at the providers discretion.     Again, thank you for choosing [facility].  Our hope is that these requests will decrease the amount of time that you wait before being seen by our physicians.       _____________________________________________________________  Should you have questions after your visit to [facility], please contact our office at [phone] between the hours of 8:00 a.m. and 4:30 p.m.  Voicemails left after 4:00 p.m. will not be returned until the following business day.  For prescription refill requests, have your pharmacy contact our office and allow 72 hours.    Cancer Center Support Programs:   > Cancer Support Group  2nd Tuesday of the month 1pm-2pm, Journey Room   

## 2018-05-20 ENCOUNTER — Other Ambulatory Visit: Payer: Self-pay | Admitting: Family Medicine

## 2018-05-20 MED ORDER — ALPRAZOLAM 0.5 MG PO TABS: 0.5000 mg | ORAL_TABLET | Freq: Two times a day (BID) | ORAL | 2 refills | Status: DC | PRN

## 2018-05-21 ENCOUNTER — Other Ambulatory Visit (HOSPITAL_COMMUNITY): Payer: Self-pay | Admitting: *Deleted

## 2018-05-21 ENCOUNTER — Encounter (HOSPITAL_COMMUNITY)
Admission: RE | Admit: 2018-05-21 | Discharge: 2018-05-21 | Disposition: A | Discharge status: Home / Self Care | Payer: 59 | Source: Ambulatory Visit | Attending: Hematology | Admitting: Hematology

## 2018-05-21 ENCOUNTER — Encounter (HOSPITAL_COMMUNITY): Payer: Self-pay

## 2018-05-21 DIAGNOSIS — R197 Diarrhea, unspecified: Secondary | ICD-10-CM | POA: Insufficient documentation

## 2018-05-21 DIAGNOSIS — C20 Malignant neoplasm of rectum: Secondary | ICD-10-CM | POA: Diagnosis not present

## 2018-05-21 MED ORDER — SODIUM CHLORIDE 0.9 % IV SOLN
INTRAVENOUS | Status: DC
  Administered 2018-05-21: 14:00:00 via INTRAVENOUS
  Filled 2018-05-21: qty 1000

## 2018-05-21 MED ORDER — SODIUM CHLORIDE 0.9 % IV SOLN
Freq: Once | INTRAVENOUS | Status: AC
  Filled 2018-05-21: qty 1000

## 2018-05-21 MED FILL — ALPRAZolam 0.5 MG TABS: 0.5 | 30 days supply | Qty: 60 | Fill #0 | Status: TO

## 2018-05-21 NOTE — Progress Notes (Unsigned)
Presents to clinic c/o diarrhea and feeling unwell today.  Would like to receive IVF in Short Stay - Dr. Raliegh Ip notified and orders rec'd for NS 1L  w/ KCl 40 mEq and Mg++ 2g to infuse over 2 hours. Orders placed in orders only encounter.

## 2018-05-30 ENCOUNTER — Ambulatory Visit (HOSPITAL_COMMUNITY): Payer: 59 | Admitting: Hematology

## 2018-05-30 ENCOUNTER — Ambulatory Visit (HOSPITAL_COMMUNITY): Payer: 59

## 2018-06-01 ENCOUNTER — Encounter (HOSPITAL_COMMUNITY): Payer: 59

## 2018-06-04 ENCOUNTER — Inpatient Hospital Stay (HOSPITAL_COMMUNITY): Payer: 59 | Attending: Hematology

## 2018-06-04 ENCOUNTER — Other Ambulatory Visit (HOSPITAL_COMMUNITY): Payer: Self-pay | Admitting: Hematology

## 2018-06-04 ENCOUNTER — Other Ambulatory Visit (HOSPITAL_COMMUNITY): Payer: Self-pay

## 2018-06-04 DIAGNOSIS — Z85850 Personal history of malignant neoplasm of thyroid: Secondary | ICD-10-CM | POA: Diagnosis not present

## 2018-06-04 DIAGNOSIS — C787 Secondary malignant neoplasm of liver and intrahepatic bile duct: Secondary | ICD-10-CM | POA: Insufficient documentation

## 2018-06-04 DIAGNOSIS — Z5112 Encounter for antineoplastic immunotherapy: Secondary | ICD-10-CM | POA: Diagnosis not present

## 2018-06-04 DIAGNOSIS — C20 Malignant neoplasm of rectum: Secondary | ICD-10-CM | POA: Diagnosis not present

## 2018-06-04 DIAGNOSIS — Z5111 Encounter for antineoplastic chemotherapy: Secondary | ICD-10-CM | POA: Insufficient documentation

## 2018-06-04 DIAGNOSIS — Z87891 Personal history of nicotine dependence: Secondary | ICD-10-CM | POA: Insufficient documentation

## 2018-06-04 LAB — URINALYSIS, DIPSTICK ONLY
Bilirubin Urine: NEGATIVE
Glucose, UA: NEGATIVE mg/dL
Ketones, ur: NEGATIVE mg/dL
Leukocytes,Ua: NEGATIVE
NITRITE: NEGATIVE
Protein, ur: NEGATIVE mg/dL
Specific Gravity, Urine: 1.015 (ref 1.005–1.030)
pH: 6 (ref 5.0–8.0)

## 2018-06-04 LAB — COMPREHENSIVE METABOLIC PANEL
ALT: 23 U/L (ref 0–44)
AST: 30 U/L (ref 15–41)
Albumin: 4.1 g/dL (ref 3.5–5.0)
Alkaline Phosphatase: 82 U/L (ref 38–126)
Anion gap: 9 (ref 5–15)
BUN: 9 mg/dL (ref 6–20)
CHLORIDE: 105 mmol/L (ref 98–111)
CO2: 25 mmol/L (ref 22–32)
Calcium: 9.1 mg/dL (ref 8.9–10.3)
Creatinine, Ser: 0.63 mg/dL (ref 0.44–1.00)
GFR calc Af Amer: >60 mL/min (ref >60)
GFR calc non Af Amer: >60 mL/min (ref >60)
Glucose, Bld: 129 mg/dL — ABNORMAL HIGH (ref 70–99)
Potassium: 3.1 mmol/L — ABNORMAL LOW (ref 3.5–5.1)
Sodium: 139 mmol/L (ref 135–145)
Total Bilirubin: 1.6 mg/dL — ABNORMAL HIGH (ref 0.3–1.2)
Total Protein: 7.3 g/dL (ref 6.5–8.1)

## 2018-06-04 LAB — CBC WITH DIFFERENTIAL/PLATELET
Abs Immature Granulocytes: 0.01 10*3/uL (ref 0.00–0.07)
BASOS PCT: 0 %
Basophils Absolute: 0 10*3/uL (ref 0.0–0.1)
Eosinophils Absolute: 0 10*3/uL (ref 0.0–0.5)
Eosinophils Relative: 1 %
HCT: 40.4 % (ref 36.0–46.0)
Hemoglobin: 13.2 g/dL (ref 12.0–15.0)
Immature Granulocytes: 0 %
Lymphocytes Relative: 37 %
Lymphs Abs: 1.2 10*3/uL (ref 0.7–4.0)
MCH: 33.1 pg (ref 26.0–34.0)
MCHC: 32.7 g/dL (ref 30.0–36.0)
MCV: 101.3 fL — ABNORMAL HIGH (ref 80.0–100.0)
Monocytes Absolute: 0.4 10*3/uL (ref 0.1–1.0)
Monocytes Relative: 14 %
Neutro Abs: 1.5 10*3/uL — ABNORMAL LOW (ref 1.7–7.7)
Neutrophils Relative %: 48 %
PLATELETS: 106 10*3/uL — AB (ref 150–400)
RBC: 3.99 MIL/uL (ref 3.87–5.11)
RDW: 15.1 % (ref 11.5–15.5)
WBC: 3.1 10*3/uL — ABNORMAL LOW (ref 4.0–10.5)
nRBC: 0 % (ref 0.0–0.2)

## 2018-06-04 LAB — MAGNESIUM: Magnesium: 1.9 mg/dL (ref 1.7–2.4)

## 2018-06-05 ENCOUNTER — Other Ambulatory Visit: Payer: Self-pay

## 2018-06-05 ENCOUNTER — Inpatient Hospital Stay (HOSPITAL_COMMUNITY): Payer: 59

## 2018-06-05 ENCOUNTER — Encounter (HOSPITAL_COMMUNITY): Payer: Self-pay

## 2018-06-05 VITALS — BP 94/66 | HR 77 | Temp 97.9°F | Resp 18 | Wt 164.8 lb

## 2018-06-05 DIAGNOSIS — Z87891 Personal history of nicotine dependence: Secondary | ICD-10-CM | POA: Diagnosis not present

## 2018-06-05 DIAGNOSIS — Z5111 Encounter for antineoplastic chemotherapy: Secondary | ICD-10-CM | POA: Diagnosis not present

## 2018-06-05 DIAGNOSIS — Z5112 Encounter for antineoplastic immunotherapy: Secondary | ICD-10-CM | POA: Diagnosis not present

## 2018-06-05 DIAGNOSIS — C20 Malignant neoplasm of rectum: Secondary | ICD-10-CM | POA: Diagnosis not present

## 2018-06-05 DIAGNOSIS — C787 Secondary malignant neoplasm of liver and intrahepatic bile duct: Secondary | ICD-10-CM | POA: Diagnosis not present

## 2018-06-05 DIAGNOSIS — Z85850 Personal history of malignant neoplasm of thyroid: Secondary | ICD-10-CM | POA: Diagnosis not present

## 2018-06-05 MED ORDER — ALTEPLASE 2 MG IJ SOLR: 2.0000 mg | Freq: Once | INTRAMUSCULAR | Status: DC | PRN

## 2018-06-05 MED ORDER — DEXTROSE 5 % IV SOLN: INTRAVENOUS | Status: DC

## 2018-06-05 MED ORDER — FLUOROURACIL CHEMO INJECTION 2.5 GM/50ML
400.0000 mg/m2 | Freq: Once | INTRAVENOUS | Status: AC
  Administered 2018-06-05: 750 mg via INTRAVENOUS
  Filled 2018-06-05: qty 15

## 2018-06-05 MED ORDER — OXALIPLATIN CHEMO INJECTION 100 MG/20ML
68.0000 mg/m2 | Freq: Once | INTRAVENOUS | Status: AC
  Administered 2018-06-05: 130 mg via INTRAVENOUS
  Filled 2018-06-05: qty 20

## 2018-06-05 MED ORDER — SODIUM CHLORIDE 0.9% FLUSH
10.0000 mL | INTRAVENOUS | Status: DC | PRN
  Administered 2018-06-05: 10 mL
  Filled 2018-06-05: qty 10

## 2018-06-05 MED ORDER — POTASSIUM CHLORIDE CRYS ER 20 MEQ PO TBCR
40.0000 meq | EXTENDED_RELEASE_TABLET | Freq: Once | ORAL | Status: AC
  Administered 2018-06-05: 40 meq via ORAL
  Filled 2018-06-05: qty 2

## 2018-06-05 MED ORDER — SODIUM CHLORIDE 0.9 % IV SOLN
Freq: Once | INTRAVENOUS | Status: AC
  Administered 2018-06-05: 10:00:00 via INTRAVENOUS
  Filled 2018-06-05: qty 5

## 2018-06-05 MED ORDER — SODIUM CHLORIDE 0.9 % IV SOLN
2400.0000 mg/m2 | INTRAVENOUS | Status: DC
  Administered 2018-06-05: 4650 mg via INTRAVENOUS
  Filled 2018-06-05: qty 93

## 2018-06-05 MED ORDER — LEUCOVORIN CALCIUM INJECTION 350 MG
800.0000 mg | Freq: Once | INTRAVENOUS | Status: AC
  Administered 2018-06-05: 800 mg via INTRAVENOUS
  Filled 2018-06-05: qty 35

## 2018-06-05 MED ORDER — PALONOSETRON HCL INJECTION 0.25 MG/5ML
0.2500 mg | Freq: Once | INTRAVENOUS | Status: AC
  Administered 2018-06-05: 0.25 mg via INTRAVENOUS
  Filled 2018-06-05: qty 5

## 2018-06-05 MED ORDER — DEXTROSE 5 % IV SOLN
INTRAVENOUS | Status: DC
  Administered 2018-06-05 (×2): via INTRAVENOUS

## 2018-06-05 MED ORDER — SODIUM CHLORIDE 0.9 % IV SOLN
6.0000 mg/kg | Freq: Once | INTRAVENOUS | Status: AC
  Administered 2018-06-05: 480 mg via INTRAVENOUS
  Filled 2018-06-05: qty 20

## 2018-06-05 MED ORDER — SODIUM CHLORIDE 0.9 % IV SOLN
INTRAVENOUS | Status: DC
  Administered 2018-06-05: 09:00:00 via INTRAVENOUS

## 2018-06-05 NOTE — Progress Notes (Signed)
Labs reviewed with Dr. Delton Coombes. VO received to give pt 50meq of K+ PO x 1 dose today. Proceed with treatment.   Total bili 1.6 viewed by Dr. Delton Coombes. Proceed with treatment.   Treatment given today per MD orders. Tolerated infusion without adverse affects. Vital signs stable. No complaints at this time. 5 FU pump infusing per protocol. Discharged from clinic ambulatory. F/U with [facility] as scheduled.

## 2018-06-05 NOTE — Patient Instructions (Signed)
[facility] Discharge Instructions for Patients Receiving Chemotherapy  Today you received the following chemotherapy agents   To help prevent nausea and vomiting after your treatment, we encourage you to take your nausea medication   If you develop nausea and vomiting that is not controlled by your nausea medication, call the clinic.   BELOW ARE SYMPTOMS THAT SHOULD BE REPORTED IMMEDIATELY:  *FEVER GREATER THAN 100.5 F  *CHILLS WITH OR WITHOUT FEVER  NAUSEA AND VOMITING THAT IS NOT CONTROLLED WITH YOUR NAUSEA MEDICATION  *UNUSUAL SHORTNESS OF BREATH  *UNUSUAL BRUISING OR BLEEDING  TENDERNESS IN MOUTH AND THROAT WITH OR WITHOUT PRESENCE OF ULCERS  *URINARY PROBLEMS  *BOWEL PROBLEMS  UNUSUAL RASH Items with * indicate a potential emergency and should be followed up as soon as possible.  Feel free to call the clinic should you have any questions or concerns. The clinic phone number is [phone].  Please show the CHEMO ALERT CARD at check-in to the Emergency Department and triage nurse.   

## 2018-06-07 ENCOUNTER — Other Ambulatory Visit: Payer: Self-pay

## 2018-06-07 ENCOUNTER — Inpatient Hospital Stay (HOSPITAL_COMMUNITY): Payer: 59

## 2018-06-07 VITALS — BP 100/69 | HR 76 | Temp 97.9°F | Resp 18

## 2018-06-07 DIAGNOSIS — C20 Malignant neoplasm of rectum: Secondary | ICD-10-CM

## 2018-06-07 DIAGNOSIS — Z5112 Encounter for antineoplastic immunotherapy: Secondary | ICD-10-CM | POA: Diagnosis not present

## 2018-06-07 DIAGNOSIS — Z87891 Personal history of nicotine dependence: Secondary | ICD-10-CM | POA: Diagnosis not present

## 2018-06-07 DIAGNOSIS — Z85850 Personal history of malignant neoplasm of thyroid: Secondary | ICD-10-CM | POA: Diagnosis not present

## 2018-06-07 DIAGNOSIS — Z5111 Encounter for antineoplastic chemotherapy: Secondary | ICD-10-CM | POA: Diagnosis not present

## 2018-06-07 DIAGNOSIS — C787 Secondary malignant neoplasm of liver and intrahepatic bile duct: Secondary | ICD-10-CM | POA: Diagnosis not present

## 2018-06-07 MED ORDER — SODIUM CHLORIDE 0.9% FLUSH
10.0000 mL | INTRAVENOUS | Status: DC | PRN
  Administered 2018-06-07: 10 mL
  Filled 2018-06-07: qty 10

## 2018-06-07 MED ORDER — HEPARIN SOD (PORK) LOCK FLUSH 100 UNIT/ML IV SOLN
500.0000 [IU] | Freq: Once | INTRAVENOUS | Status: AC | PRN
  Administered 2018-06-07: 500 [IU]

## 2018-06-11 ENCOUNTER — Other Ambulatory Visit (HOSPITAL_COMMUNITY): Payer: Self-pay | Admitting: *Deleted

## 2018-06-11 ENCOUNTER — Other Ambulatory Visit (HOSPITAL_COMMUNITY): Payer: Self-pay | Admitting: Hematology

## 2018-06-11 MED ORDER — DIPHENOXYLATE-ATROPINE 2.5-0.025 MG PO TABS: 2.0000 | ORAL_TABLET | Freq: Four times a day (QID) | ORAL | 3 refills | Status: DC | PRN

## 2018-06-11 NOTE — Progress Notes (Signed)
Patient called today stating that she is having diarrhea that is uncontrolled. She states that it has been going on since the weekend and imodium is not controlling it.  I have sent a message to the provider, as patient wants to come in for fluids.  Will await orders.

## 2018-06-12 ENCOUNTER — Other Ambulatory Visit (HOSPITAL_COMMUNITY): Payer: Self-pay | Admitting: *Deleted

## 2018-06-12 ENCOUNTER — Encounter (HOSPITAL_COMMUNITY): Payer: Self-pay

## 2018-06-12 ENCOUNTER — Encounter (HOSPITAL_COMMUNITY)
Admission: RE | Admit: 2018-06-12 | Discharge: 2018-06-12 | Disposition: A | Discharge status: Home / Self Care | Payer: 59 | Source: Ambulatory Visit | Attending: Hematology | Admitting: Hematology

## 2018-06-12 ENCOUNTER — Other Ambulatory Visit: Payer: Self-pay

## 2018-06-12 DIAGNOSIS — E876 Hypokalemia: Secondary | ICD-10-CM | POA: Insufficient documentation

## 2018-06-12 DIAGNOSIS — C20 Malignant neoplasm of rectum: Secondary | ICD-10-CM | POA: Diagnosis not present

## 2018-06-12 MED ORDER — SODIUM CHLORIDE 0.9 % IV SOLN: Freq: Once | INTRAVENOUS | Status: DC

## 2018-06-12 MED ORDER — HEPARIN SOD (PORK) LOCK FLUSH 100 UNIT/ML IV SOLN
500.0000 [IU] | Freq: Once | INTRAVENOUS | Status: AC
  Administered 2018-06-12: 500 [IU] via INTRAVENOUS

## 2018-06-12 MED ORDER — SODIUM CHLORIDE 0.9 % IV SOLN
INTRAVENOUS | Status: DC
  Administered 2018-06-12: 10:00:00 via INTRAVENOUS
  Filled 2018-06-12: qty 1000

## 2018-06-13 DIAGNOSIS — C20 Malignant neoplasm of rectum: Secondary | ICD-10-CM | POA: Diagnosis not present

## 2018-06-19 MED FILL — DOXYCYCLINE HYCLATE 100 MG: 100 | 30 days supply | Qty: 60 | Fill #0

## 2018-06-21 ENCOUNTER — Ambulatory Visit
Admission: RE | Admit: 2018-06-21 | Discharge: 2018-06-21 | Disposition: A | Discharge status: Home / Self Care | Payer: 59 | Source: Ambulatory Visit | Attending: Nurse Practitioner | Admitting: Nurse Practitioner

## 2018-06-21 ENCOUNTER — Other Ambulatory Visit: Payer: Self-pay

## 2018-06-21 DIAGNOSIS — R911 Solitary pulmonary nodule: Secondary | ICD-10-CM | POA: Insufficient documentation

## 2018-06-21 DIAGNOSIS — C20 Malignant neoplasm of rectum: Secondary | ICD-10-CM | POA: Diagnosis not present

## 2018-06-21 DIAGNOSIS — C218 Malignant neoplasm of overlapping sites of rectum, anus and anal canal: Secondary | ICD-10-CM | POA: Diagnosis not present

## 2018-06-21 LAB — GLUCOSE, CAPILLARY: Glucose-Capillary: 71 mg/dL (ref 70–99)

## 2018-06-21 MED ORDER — FLUDEOXYGLUCOSE F - 18 (FDG) INJECTION
8.5000 | Freq: Once | INTRAVENOUS | Status: AC | PRN
  Administered 2018-06-21: 8.94 via INTRAVENOUS

## 2018-06-25 ENCOUNTER — Ambulatory Visit (HOSPITAL_COMMUNITY)
Admission: RE | Admit: 2018-06-25 | Discharge: 2018-06-25 | Disposition: A | Discharge status: Home / Self Care | Payer: 59 | Source: Ambulatory Visit | Attending: Nurse Practitioner | Admitting: Nurse Practitioner

## 2018-06-25 ENCOUNTER — Other Ambulatory Visit: Payer: Self-pay

## 2018-06-25 ENCOUNTER — Ambulatory Visit: Payer: 59

## 2018-06-25 DIAGNOSIS — C218 Malignant neoplasm of overlapping sites of rectum, anus and anal canal: Secondary | ICD-10-CM | POA: Diagnosis not present

## 2018-06-25 DIAGNOSIS — C20 Malignant neoplasm of rectum: Secondary | ICD-10-CM | POA: Insufficient documentation

## 2018-06-25 DIAGNOSIS — C787 Secondary malignant neoplasm of liver and intrahepatic bile duct: Secondary | ICD-10-CM | POA: Diagnosis not present

## 2018-06-25 MED ORDER — GADOBUTROL 1 MMOL/ML IV SOLN
7.0000 mL | Freq: Once | INTRAVENOUS | Status: AC | PRN
  Administered 2018-06-25: 7 mL via INTRAVENOUS

## 2018-06-26 ENCOUNTER — Inpatient Hospital Stay (HOSPITAL_BASED_OUTPATIENT_CLINIC_OR_DEPARTMENT_OTHER): Payer: 59 | Admitting: Hematology

## 2018-06-26 ENCOUNTER — Other Ambulatory Visit (HOSPITAL_COMMUNITY)
Admission: RE | Admit: 2018-06-26 | Discharge: 2018-06-26 | Disposition: A | Discharge status: Home / Self Care | Payer: 59 | Source: Ambulatory Visit | Attending: Hematology | Admitting: Hematology

## 2018-06-26 ENCOUNTER — Ambulatory Visit (HOSPITAL_COMMUNITY): Payer: 59

## 2018-06-26 ENCOUNTER — Other Ambulatory Visit: Payer: Self-pay

## 2018-06-26 ENCOUNTER — Encounter (HOSPITAL_COMMUNITY): Payer: Self-pay | Admitting: Hematology

## 2018-06-26 DIAGNOSIS — C20 Malignant neoplasm of rectum: Secondary | ICD-10-CM | POA: Diagnosis not present

## 2018-06-26 DIAGNOSIS — Z87891 Personal history of nicotine dependence: Secondary | ICD-10-CM | POA: Diagnosis not present

## 2018-06-26 DIAGNOSIS — Z5111 Encounter for antineoplastic chemotherapy: Secondary | ICD-10-CM | POA: Diagnosis not present

## 2018-06-26 DIAGNOSIS — C787 Secondary malignant neoplasm of liver and intrahepatic bile duct: Secondary | ICD-10-CM

## 2018-06-26 DIAGNOSIS — Z5112 Encounter for antineoplastic immunotherapy: Secondary | ICD-10-CM | POA: Diagnosis not present

## 2018-06-26 DIAGNOSIS — Z85850 Personal history of malignant neoplasm of thyroid: Secondary | ICD-10-CM | POA: Diagnosis not present

## 2018-06-26 LAB — COMPREHENSIVE METABOLIC PANEL
ALT: 19 U/L (ref 0–44)
ANION GAP: 8 (ref 5–15)
AST: 28 U/L (ref 15–41)
Albumin: 4.1 g/dL (ref 3.5–5.0)
Alkaline Phosphatase: 93 U/L (ref 38–126)
BUN: 9 mg/dL (ref 6–20)
CO2: 26 mmol/L (ref 22–32)
Calcium: 9 mg/dL (ref 8.9–10.3)
Chloride: 107 mmol/L (ref 98–111)
Creatinine, Ser: 0.72 mg/dL (ref 0.44–1.00)
GFR calc non Af Amer: >60 mL/min (ref >60)
Glucose, Bld: 93 mg/dL (ref 70–99)
Potassium: 3.3 mmol/L — ABNORMAL LOW (ref 3.5–5.1)
SODIUM: 141 mmol/L (ref 135–145)
TOTAL PROTEIN: 7.2 g/dL (ref 6.5–8.1)
Total Bilirubin: 2.2 mg/dL — ABNORMAL HIGH (ref 0.3–1.2)

## 2018-06-26 LAB — MAGNESIUM: Magnesium: 2 mg/dL (ref 1.7–2.4)

## 2018-06-26 LAB — CBC WITH DIFFERENTIAL/PLATELET
Abs Immature Granulocytes: 0.03 10*3/uL (ref 0.00–0.07)
BASOS PCT: 1 %
Basophils Absolute: 0 10*3/uL (ref 0.0–0.1)
EOS ABS: 0 10*3/uL (ref 0.0–0.5)
Eosinophils Relative: 0 %
HCT: 41.7 % (ref 36.0–46.0)
Hemoglobin: 13.8 g/dL (ref 12.0–15.0)
Immature Granulocytes: 1 %
Lymphocytes Relative: 33 %
Lymphs Abs: 1.1 10*3/uL (ref 0.7–4.0)
MCH: 32.7 pg (ref 26.0–34.0)
MCHC: 33.1 g/dL (ref 30.0–36.0)
MCV: 98.8 fL (ref 80.0–100.0)
Monocytes Absolute: 0.5 10*3/uL (ref 0.1–1.0)
Monocytes Relative: 15 %
Neutro Abs: 1.6 10*3/uL — ABNORMAL LOW (ref 1.7–7.7)
Neutrophils Relative %: 50 %
PLATELETS: 120 10*3/uL — AB (ref 150–400)
RBC: 4.22 MIL/uL (ref 3.87–5.11)
RDW: 13.8 % (ref 11.5–15.5)
WBC: 3.3 10*3/uL — ABNORMAL LOW (ref 4.0–10.5)
nRBC: 0 % (ref 0.0–0.2)

## 2018-06-26 NOTE — Patient Instructions (Addendum)
[place] at [facility] Discharge Instructions  You were seen today by Dr. Delton Coombes. He went over your recent scan results look good. He would like to send you to a surgeon to have you evaluated. He will see you back in 2 weeks for labs and follow up.   Thank you for choosing [place] at [facility] to provide your oncology and hematology care.  To afford each patient quality time with our provider, please arrive at least 15 minutes before your scheduled appointment time.   If you have a lab appointment with the [facility] please come in thru the  Main Entrance and check in at the main information desk  You need to re-schedule your appointment should you arrive 10 or more minutes late.  We strive to give you quality time with our providers, and arriving late affects you and other patients whose appointments are after yours.  Also, if you no show three or more times for appointments you may be dismissed from the clinic at the providers discretion.     Again, thank you for choosing [facility].  Our hope is that these requests will decrease the amount of time that you wait before being seen by our physicians.       _____________________________________________________________  Should you have questions after your visit to [facility], please contact our office at [phone] between the hours of 8:00 a.m. and 4:30 p.m.  Voicemails left after 4:00 p.m. will not be returned until the following business day.  For prescription refill requests, have your pharmacy contact our office and allow 72 hours.    Cancer Center Support Programs:   > Cancer Support Group  2nd Tuesday of the month 1pm-2pm, Journey Room

## 2018-06-26 NOTE — Progress Notes (Signed)
[place], [place] [address]   CLINIC:  Medical Oncology/Hematology  PCP:  [name], MD [address] [place] [address] [phone]   REASON FOR VISIT:  Follow-up for rectal cancer  CURRENT THERAPY:Folfoxand vectibix   BRIEF ONCOLOGIC HISTORY:    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[place])   02/26/2018 Initial Diagnosis    Rectal cancer ([place])    03/13/2018 Cancer Staging    Staging form: Colon and Rectum, AJCC 8th Edition - Clinical stage from 03/13/2018: Stage IVA (cT3, cN1b, cM1a) - Signed by Derek Jack, MD on 03/13/2018    03/14/2018 -  Chemotherapy    The patient had palonosetron (ALOXI) injection 0.25 mg, 0.25 mg, Intravenous,  Once, 6 of 8 cycles Administration: 0.25 mg (03/14/2018), 0.25 mg (03/27/2018), 0.25 mg (04/18/2018), 0.25 mg (05/02/2018), 0.25 mg (05/16/2018), 0.25 mg (06/05/2018) leucovorin 800 mg in dextrose 5 % 250 mL infusion, 772 mg, Intravenous,  Once, 6 of 8 cycles Administration: 800 mg (03/14/2018), 800 mg (03/27/2018), 800 mg (05/02/2018), 800 mg (05/16/2018), 700 mg (04/18/2018), 800 mg (06/05/2018) oxaliplatin (ELOXATIN) 165 mg in dextrose 5 % 500 mL chemo infusion, 85 mg/m2 = 165 mg, Intravenous,  Once, 5 of 7 cycles Dose modification: 68 mg/m2 (80 % of original dose 85 mg/m2, Cycle 4, Reason: Provider Judgment) Administration: 165 mg (03/14/2018), 165 mg (03/27/2018), 130 mg (05/02/2018), 130 mg (05/16/2018), 130 mg (06/05/2018) panitumumab (VECTIBIX) 500 mg in sodium chloride 0.9 % 100 mL chemo infusion, 480 mg, Intravenous,  Once, 4 of 5 cycles Administration: 500 mg (04/18/2018), 480 mg (05/02/2018), 480 mg (05/16/2018), 480 mg (06/05/2018) fluorouracil (ADRUCIL) chemo injection 750 mg, 400 mg/m2 = 750 mg (100 % of original dose 400 mg/m2), Intravenous,  Once, 5 of 7 cycles Dose modification: 400 mg/m2 (original dose 400 mg/m2, Cycle 1), 400 mg/m2 (original dose 400 mg/m2, Cycle 3) Administration: 750  mg (03/14/2018), 750 mg (04/18/2018), 750 mg (05/02/2018), 750 mg (05/16/2018), 750 mg (06/05/2018) fosaprepitant (EMEND) 150 mg, dexamethasone (DECADRON) 12 mg in sodium chloride 0.9 % 145 mL IVPB, , Intravenous,  Once, 3 of 5 cycles Administration:  (05/02/2018),  (05/16/2018),  (06/05/2018) fluorouracil (ADRUCIL) 4,650 mg in sodium chloride 0.9 % 57 mL chemo infusion, 2,400 mg/m2 = 4,650 mg, Intravenous, 1 Day/Dose, 6 of 8 cycles Administration: 4,650 mg (03/14/2018), 4,650 mg (03/27/2018), 4,650 mg (04/18/2018), 4,650 mg (05/02/2018), 4,650 mg (05/16/2018), 4,650 mg (06/05/2018)  for chemotherapy treatment.       CANCER STAGING: Cancer Staging Malignant neoplasm of rectum [facility]) Staging form: Colon and Rectum, AJCC 8th Edition - Clinical stage from 03/13/2018: Stage IVA (cT3, cN1b, cM1a) - Signed by Derek Jack, MD on 03/13/2018    INTERVAL HISTORY:  Ms. Kosier 53 y.o. female returns for routine follow-up. She is here today alone. She states that she her hair is starting to fall out and her skin is severely dry. She states that the rash still comes and goes. Denies any nausea, vomiting, or diarrhea. Denies any new pains. Had not noticed any recent bleeding such as epistaxis, hematuria or hematochezia. Denies recent chest pain on exertion, shortness of breath on minimal exertion, pre-syncopal episodes, or palpitations. Denies any numbness or tingling in hands or feet. Denies any recent fevers, infections, or recent hospitalizations. Patient reports appetite at 50% and energy level at 50%.   REVIEW OF SYSTEMS:  Review of Systems  Skin: Positive for rash.  Neurological:       Cold sensitivity in the hands  present.  All other systems reviewed and are negative.    PAST MEDICAL/SURGICAL HISTORY:  Past Medical History:  Diagnosis Date  . Anxiety   . Cancer [facility]) 2013   thyroid  . Endometrial polyp   . Endometrial thickening on ultra sound   . GERD (gastroesophageal reflux disease)   .  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 H/O Hashimoto thyroiditis   . 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 Hyperlipidemia 11/09/2015  . Hypothyroidism   . 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[place]) 02/26/2018   Past Surgical History:  Procedure Laterality Date  . BIOPSY  02/19/2018   Procedure: BIOPSY;  Surgeon: Rogene Houston, MD;  [facility];  Service: Endoscopy;;  rectum  . COLONOSCOPY N/A 08/20/2015   Procedure: COLONOSCOPY;  Surgeon: Rogene Houston, MD;  [facility];  Service: Endoscopy;  Laterality: N/A;  730  . [place] ENDOMETRIAL ABLATION  08-13-2004  . FLEXIBLE SIGMOIDOSCOPY N/A 02/19/2018   Procedure: FLEXIBLE SIGMOIDOSCOPY;  Surgeon: Rogene Houston, MD;  [facility];  Service: Endoscopy;  Laterality: N/A;  . HYSTEROSCOPY W/D&C N/A 04/30/2015   Procedure: DILATATION AND CURETTAGE / INTENDED HYSTEROSCOPY;  Surgeon: Dian Queen, MD;  Location: [facility];  Service: Gynecology;  Laterality: N/A;  . IR US GUIDE BX ASP/DRAIN  03/09/2018  . LAPAROSCOPIC CHOLECYSTECTOMY  04/1996  . POLYPECTOMY  08/20/2015   Procedure: POLYPECTOMY;  Surgeon: Rogene Houston, MD;  [facility];  Service: Endoscopy;;  Splenic flexure polypectomy  . POLYPECTOMY  02/19/2018   Procedure: POLYPECTOMY;  Surgeon: Rogene Houston, MD;  [facility];  Service: Endoscopy;;  rectum  . PORTACATH PLACEMENT N/A 03/14/2018   Procedure: INSERTION PORT-A-CATH;  Surgeon: Aviva Signs, MD;  Location: [facility];  Service: General;  Laterality: N/A;  . REDUCTION INCARCERATED UTERUS  06-20-2000   intrauterine preg. 13 wks /  urinary retention  . THYROID LOBECTOMY  11/24/2011   Procedure: THYROID LOBECTOMY;  Surgeon: Earnstine Regal, MD;  Location: [facility];  Service: General;  Laterality: Left;  Left  Thyroid Lobectomy  . TUBAL LIGATION  2002     SOCIAL HISTORY:  Social History   Socioeconomic History  . Marital status: Married    Spouse name: Not on file  . Number of children: 4  . Years of education: Not on file  . Highest education level: Not on file  Occupational History  . Not on file  Social Needs  . Financial resource strain: Not hard at all  . Food insecurity:    Worry: Never true    Inability: Never true  . Transportation needs:    Medical: No    Non-medical: No  Tobacco Use  . Smoking status: Former Smoker    Packs/day: 0.25    Years: 10.00    Pack years: 2.50    Types: Cigarettes    Last attempt to quit: 10/31/1991    Years since quitting: 26.6  . Smokeless tobacco: Never Used  Substance and Sexual Activity  . Alcohol use: No  . Drug use: No  . Sexual activity: Not Currently  Lifestyle  . Physical activity:    Days per week: 0 days    Minutes per session: 0 min  . Stress: To some extent  Relationships  .  Social connections:    Talks on phone: More than three times a week    Gets together: Twice a week    Attends religious service: More than 4 times per year    Active member of club or organization: Yes    Attends meetings of clubs or organizations: More than 4 times per year    Relationship status: Married  . Intimate partner violence:    Fear of current or ex partner: No    Emotionally abused: No    Physically abused: No    Forced sexual activity: No  Other Topics Concern  . Not on file  Social History Narrative  . Not on file    FAMILY HISTORY:  Family History  Problem Relation Age of Onset  . Hypertension Mother   . Hypertension Father   . Prostate cancer Father   . Cancer Maternal Aunt        spine/back  . Cancer Paternal Grandfather        lung  . Healthy Son   . Healthy Son   . Healthy Son   . Healthy Son   . Colon cancer Neg Hx     CURRENT MEDICATIONS:  Outpatient Encounter Medications as of 06/26/2018  Medication Sig   . ALPRAZolam (XANAX) 0.5 MG tablet Take 1 tablet (0.5 mg total) by mouth 2 (two) times daily as needed for anxiety.  . docusate sodium (COLACE) 100 MG capsule Take 100 mg by mouth daily as needed for mild constipation.  Marland Kitchen doxycycline (VIBRA-TABS) 100 MG tablet Take 1 tablet (100 mg total) by mouth 2 (two) times daily.  . fluticasone (CUTIVATE) 0.05 % cream Apply 1 application topically 2 (two) times daily as needed. Skin irritation/rash  . hydrocortisone cream 0.5 % Apply 1 application topically 2 (two) times daily.  Marland Kitchen lactulose (CHRONULAC) 10 GM/15ML solution Take 45 mLs (30 g total) by mouth at bedtime as needed for mild constipation.  . lidocaine-prilocaine (EMLA) cream Apply small amount to port site one hour prior to appointment and cover with plastic wrap.  . prochlorperazine (COMPAZINE) 10 MG tablet Take 1 tablet (10 mg total) by mouth every 6 (six) hours as needed for nausea or vomiting.  . thyroid (ARMOUR) 90 MG tablet Take 90 mg by mouth daily before breakfast.   . diphenoxylate-atropine (LOMOTIL) 2.5-0.025 MG tablet Take 2 tablets by mouth 4 (four) times daily as needed for diarrhea or loose stools. (Patient not taking: Reported on 06/26/2018)  . fluorouracil CALGB 66063 in sodium chloride 0.9 % 150 mL Inject into the vein over 96 hr.  . leucovorin in dextrose 5 % 250 mL Inject into the vein once.  . progesterone (PROMETRIUM) 100 MG capsule Take 100 mg by mouth at bedtime.   Facility-Administered Encounter Medications as of 06/26/2018  Medication  . sodium chloride 0.9 % 1,000 mL with potassium chloride 20 mEq, magnesium sulfate 2 g infusion    Allergies: -Please see allergy list.   PHYSICAL EXAM:  ECOG Performance status: 0  Vitals:   06/26/18 0938  BP: 95/69  Pulse: (!) 111  Resp: 18  Temp: (!) 97.5 F (36.4 C)  SpO2: 100%   Filed Weights   06/26/18 0938  Weight: 164 lb 1.6 oz (74.4 kg)    Physical Exam Constitutional:      Appearance: Normal appearance.   Cardiovascular:     Rate and Rhythm: Normal rate and regular rhythm.     Heart sounds: Normal heart sounds.  Pulmonary:  Effort: Pulmonary effort is normal.     Breath sounds: Normal breath sounds.  Abdominal:     General: There is no distension.     Palpations: Abdomen is soft. There is no mass.  Skin:    General: Skin is warm.     Comments: Scaling of skin on the anterior abdomen.  There is papular rash on the face.  Neurological:     General: No focal deficit present.     Mental Status: She is alert and oriented to person, place, and time.  Psychiatric:        Mood and Affect: Mood normal.        Behavior: Behavior normal.      LABORATORY DATA:  I have reviewed the labs as listed.  CBC    Component Value Date/Time   WBC 3.3 (L) 06/26/2018 1046   RBC 4.22 06/26/2018 1046   HGB 13.8 06/26/2018 1046   HGB 13.5 11/03/2015 0841   HCT 41.7 06/26/2018 1046   HCT 39.7 11/03/2015 0841   PLT 120 (L) 06/26/2018 1046   PLT 165 11/03/2015 0841   MCV 98.8 06/26/2018 1046   MCV 95 11/03/2015 0841   MCH 32.7 06/26/2018 1046   MCHC 33.1 06/26/2018 1046   RDW 13.8 06/26/2018 1046   RDW 13.2 11/03/2015 0841   LYMPHSABS 1.1 06/26/2018 1046   LYMPHSABS 1.4 11/03/2015 0841   MONOABS 0.5 06/26/2018 1046   EOSABS 0.0 06/26/2018 1046   EOSABS 0.1 11/03/2015 0841   BASOSABS 0.0 06/26/2018 1046   BASOSABS 0.0 11/03/2015 0841   CMP Latest Ref Rng & Units 06/26/2018 06/04/2018 05/15/2018  Glucose 70 - 99 mg/dL 93 129(H) 99  BUN 6 - 20 mg/dL '9 9 8  '$ Creatinine 0.44 - 1.00 mg/dL 0.72 0.63 0.62  Sodium 135 - 145 mmol/L 141 139 138  Potassium 3.5 - 5.1 mmol/L 3.3(L) 3.1(L) 3.2(L)  Chloride 98 - 111 mmol/L 107 105 106  CO2 22 - 32 mmol/L '26 25 25  '$ Calcium 8.9 - 10.3 mg/dL 9.0 9.1 9.0  Total Protein 6.5 - 8.1 g/dL 7.2 7.3 7.1  Total Bilirubin 0.3 - 1.2 mg/dL 2.2(H) 1.6(H) 1.9(H)  Alkaline Phos 38 - 126 U/L 93 82 69  AST 15 - 41 U/L 28 30 49(H)  ALT 0 - 44 U/L 19 23 45(H)        DIAGNOSTIC IMAGING:  I have independently reviewed the scans and discussed with the patient.   I have reviewed Venita Lick LPN's note and agree with the documentation.  I personally performed a face-to-face visit, made revisions and my assessment and plan is as follows.    ASSESSMENT & PLAN:   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HCC) 1.  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 - Colonoscopy on 02/19/2018 shows rectal mass, 2 to 4 cm from the anal verge, normal sigmoid colon.  Biopsies consistent with adenocarcinoma. - MRI of the pelvis shows rectal adenocarcinoma, T stage-T3, N stage- N1-equivocal N2 nodes.  Maximum extension beyond muscularis propria is 6 mm (advanced T3).  Circumferential resection margin is 7 mm.  Right obturator node 6 mm.  Right external iliac node at 8 mm.  Craniocaudal extent of the tumor is 4.4 cm.  Approximately 180 degree circumferential.  Shortest distance from the distal tumor to anal  sphincter is 2.1 cm.  Location from anal verge, 7.2 cm to the middle of the tumor. - CT CAP on 02/21/2018 shows 9 x 7 mm left lower lobe pulmonary nodule, new since 2009.  Possible subpleural left lower lobe 3 mm nodule versus area of pleural thickening.  Vague area of hypoattenuation in the posterior right hepatic lobe measuring 1.6 cm.  Differential includes benign/malignant lesion with altered perfusion.  Focal steatosis is possible. - MRI of the liver on 03/08/2018 shows 1.4 cm lesion in the posterior right hepatic lobe.  There is a 5 mm focus of delayed hyperenhancement in the lateral segment of the left liver, questionable for a second metastatic deposit. -PET CT scan on 03/05/2018 shows hypermetabolic rectal mass.  Left perirectal hypermetabolic and sigmoid mesocolon  lymph nodes.  Single hepatic metastatic lesion in the right hepatic lobe.  7 mm pulmonary nodule at the left lung base is suspicious for meta stasis. -Liver biopsy on 03/09/2018 consistent with adenocarcinoma.  - Cycle 1 FOLFOX on 03/14/2018.  She had constipation lasting for 5 days.  She also had cold sensitivity for few days.  She had some nausea but denied any vomiting. - Cycle 2 of FOLFOX on 03/27/2018. -She was admitted to the hospital from 03/27/2018 through 03/29/2018 with neutropenic fever and sepsis.  She received IV antibiotics. 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  Low starting dose of Irinotecan is indicated. -Because of her elevated total bilirubin to above 5, cycle 3 was given on 04/18/2018 without oxaliplatin.  However vectibix was started. - She did have 1 day of severe weakness after last chemotherapy.  However she did very well without cold sensitivity. -She was evaluated by Dr. Marcille Buffy at transplant hepatology clinic at [facility].  They felt that Irinotecan should be avoided. - Oxaliplatin was dose reduced by 20% during cycle 4 on 05/02/2018. - 6 cycles were completed on 06/05/2018. -She continues to have some cold sensitivity just in the hands.  Denies any tingling or numbness extremities. -We reviewed the results of the PET scan dated 06/21/2018 which showed interval resolution of the hypermetabolic them associated with right lobe of liver metastasis.  Spleen has slightly enlarged to 13.4 cm in length from 12.9 cm previously.  Interval decrease in the radiotracer uptake associated with rectal neoplasm with SUV of 5 compared to 17.5 previously.  Previously noted FDG avid sigmoid mesocolon lymph nodes have resolved. - We have also reviewed MRI of the liver results dated 06/25/2018.  This showed decrease in size of the liver metastasis to 5 mm from 15 mm previously.  No new lesions were noted. - I have  recommended seeking opinion from Dr. Marcello Moores at this time.  I have recommended to proceed with her chemotherapy tomorrow.  She is having a lot of skin rash from vectibix.  We will hold off on vectibix during cycle 7. -I will contact Dr. Marcello Moores to let her know the scan findings.  I will see her back in 2 weeks for follow-up.   2.  EGFR antibody induced skin rash: -She had grade 3- with occasional sensitivity of the scalp and skin. -She has papular rash on the upper back extremities, lower extremities, abdomen and face including nasolabial fold. -She will continue doxycycline 100 mg twice daily for another week and will stop it as we are holding vectibix during cycle 7.  3.  Thyroid cancer: -She had a left thyroidectomy done  about 6 years ago.  She is on Synthroid at this time. -TSH, thyroglobulin and antithyroglobulin antibodies were normal.      Orders placed this encounter:  No orders of the defined types were placed in this encounter.     Derek Jack, MD [place] [phone]

## 2018-06-26 NOTE — Assessment & Plan Note (Addendum)
1.  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 - Colonoscopy on 02/19/2018 shows rectal mass, 2 to 4 cm from the anal verge, normal sigmoid colon.  Biopsies consistent with adenocarcinoma. - MRI of the pelvis shows rectal adenocarcinoma, T stage-T3, N stage- N1-equivocal N2 nodes.  Maximum extension beyond muscularis propria is 6 mm (advanced T3).  Circumferential resection margin is 7 mm.  Right obturator node 6 mm.  Right external iliac node at 8 mm.  Craniocaudal extent of the tumor is 4.4 cm.  Approximately 180 degree circumferential.  Shortest distance from the distal tumor to anal sphincter is 2.1 cm.  Location from anal verge, 7.2 cm to the middle of the tumor. - CT CAP on 02/21/2018 shows 9 x 7 mm left lower lobe pulmonary nodule, new since 2009.  Possible subpleural left lower lobe 3 mm nodule versus area of pleural thickening.  Vague area of hypoattenuation in the posterior right hepatic lobe measuring 1.6 cm.  Differential includes benign/malignant lesion with altered perfusion.  Focal steatosis is possible. - MRI of the liver on 03/08/2018 shows 1.4 cm lesion in the posterior right hepatic lobe.  There is a 5 mm focus of delayed hyperenhancement in the lateral segment of the left liver, questionable for a second metastatic deposit. -PET CT scan on 03/05/2018 shows hypermetabolic rectal mass.  Left perirectal hypermetabolic and sigmoid mesocolon lymph nodes.  Single hepatic metastatic lesion in the right hepatic lobe.  7 mm pulmonary nodule at the left lung base is suspicious for meta stasis. -Liver biopsy on 03/09/2018 consistent with adenocarcinoma.  - Cycle 1 FOLFOX on 03/14/2018.  She had constipation lasting for 5 days.  She also had cold sensitivity for few days.  She had some  nausea but denied any vomiting. - Cycle 2 of FOLFOX on 03/27/2018. -She was admitted to the hospital from 03/27/2018 through 03/29/2018 with neutropenic fever and sepsis.  She received IV antibiotics. 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  Low starting dose of Irinotecan is indicated. -Because of her elevated total bilirubin to above 5, cycle 3 was given on 04/18/2018 without oxaliplatin.  However vectibix was started. - She did have 1 day of severe weakness after last chemotherapy.  However she did very well without cold sensitivity. -She was evaluated by Dr. Marcille Buffy at transplant hepatology clinic at [facility].  They felt that Irinotecan should be avoided. - Oxaliplatin was dose reduced by 20% during cycle 4 on 05/02/2018. - 6 cycles were completed on 06/05/2018. -She continues to have some cold sensitivity just in the hands.  Denies any tingling or numbness extremities. -We reviewed the results of the PET scan dated 06/21/2018 which showed interval resolution of the hypermetabolic them associated with right lobe of liver metastasis.  Spleen has slightly enlarged to 13.4 cm in length from 12.9 cm previously.  Interval decrease in the radiotracer uptake associated with rectal neoplasm with SUV of 5 compared to 17.5 previously.  Previously noted FDG avid sigmoid mesocolon lymph nodes have resolved. - We have also reviewed MRI of the liver results dated 06/25/2018.  This showed decrease in size of the liver metastasis to 5 mm from 15 mm previously.  No new lesions were noted. - I have  recommended seeking opinion from Dr. Marcello Moores at this time.  I have recommended to proceed with her chemotherapy tomorrow.  She is having a lot of skin rash from vectibix.  We will hold off on vectibix during cycle 7. -I will contact Dr. Marcello Moores to let her know the scan findings.  I will see her back in 2 weeks for follow-up.   2.  EGFR antibody  induced skin rash: -She had grade 3- with occasional sensitivity of the scalp and skin. -She has papular rash on the upper back extremities, lower extremities, abdomen and face including nasolabial fold. -She will continue doxycycline 100 mg twice daily for another week and will stop it as we are holding vectibix during cycle 7.  3.  Thyroid cancer: -She had a left thyroidectomy done about 6 years ago.  She is on Synthroid at this time. -TSH, thyroglobulin and antithyroglobulin antibodies were normal.

## 2018-06-27 ENCOUNTER — Inpatient Hospital Stay (HOSPITAL_COMMUNITY): Payer: 59 | Attending: Hematology

## 2018-06-27 ENCOUNTER — Ambulatory Visit (HOSPITAL_COMMUNITY): Payer: 59 | Admitting: Hematology

## 2018-06-27 ENCOUNTER — Encounter (HOSPITAL_COMMUNITY): Payer: Self-pay

## 2018-06-27 VITALS — BP 103/72 | HR 94 | Temp 97.9°F | Resp 18

## 2018-06-27 DIAGNOSIS — Z5111 Encounter for antineoplastic chemotherapy: Secondary | ICD-10-CM | POA: Diagnosis not present

## 2018-06-27 DIAGNOSIS — Z85850 Personal history of malignant neoplasm of thyroid: Secondary | ICD-10-CM | POA: Diagnosis not present

## 2018-06-27 DIAGNOSIS — C787 Secondary malignant neoplasm of liver and intrahepatic bile duct: Secondary | ICD-10-CM | POA: Diagnosis not present

## 2018-06-27 DIAGNOSIS — C20 Malignant neoplasm of rectum: Secondary | ICD-10-CM | POA: Diagnosis not present

## 2018-06-27 MED ORDER — DEXTROSE 5 % IV SOLN
INTRAVENOUS | Status: DC
  Administered 2018-06-27: 10:00:00 via INTRAVENOUS

## 2018-06-27 MED ORDER — SODIUM CHLORIDE 0.9 % IV SOLN
Freq: Once | INTRAVENOUS | Status: AC
  Administered 2018-06-27: 09:00:00 via INTRAVENOUS
  Filled 2018-06-27: qty 5

## 2018-06-27 MED ORDER — SODIUM CHLORIDE 0.9% FLUSH
10.0000 mL | INTRAVENOUS | Status: DC | PRN
  Administered 2018-06-27: 10 mL
  Filled 2018-06-27: qty 10

## 2018-06-27 MED ORDER — FLUOROURACIL CHEMO INJECTION 2.5 GM/50ML
400.0000 mg/m2 | Freq: Once | INTRAVENOUS | Status: AC
  Administered 2018-06-27: 750 mg via INTRAVENOUS
  Filled 2018-06-27: qty 15

## 2018-06-27 MED ORDER — OXALIPLATIN CHEMO INJECTION 100 MG/20ML
68.0000 mg/m2 | Freq: Once | INTRAVENOUS | Status: AC
  Administered 2018-06-27: 130 mg via INTRAVENOUS
  Filled 2018-06-27: qty 20

## 2018-06-27 MED ORDER — LEUCOVORIN CALCIUM INJECTION 350 MG
415.0000 mg/m2 | Freq: Once | INTRAVENOUS | Status: AC
  Administered 2018-06-27: 800 mg via INTRAVENOUS
  Filled 2018-06-27: qty 35

## 2018-06-27 MED ORDER — SODIUM CHLORIDE 0.9 % IV SOLN
2400.0000 mg/m2 | INTRAVENOUS | Status: DC
  Administered 2018-06-27: 4650 mg via INTRAVENOUS
  Filled 2018-06-27: qty 93

## 2018-06-27 MED ORDER — DEXTROSE 5 % IV SOLN
INTRAVENOUS | Status: DC
  Administered 2018-06-27: 09:00:00 via INTRAVENOUS

## 2018-06-27 MED ORDER — PALONOSETRON HCL INJECTION 0.25 MG/5ML
0.2500 mg | Freq: Once | INTRAVENOUS | Status: AC
  Administered 2018-06-27: 0.25 mg via INTRAVENOUS
  Filled 2018-06-27: qty 5

## 2018-06-27 NOTE — Progress Notes (Signed)
[id number] Labs reviewed with and pt seen by Dr. Delton Coombes yesterday and pt approved for chemo tx except Vectibix today per MD.                                           Josue Hector tolerated chemo tx well without complaints or incident. Pt discharged with 5FU pump infusing without issues. VSS upon discharge. Pt discharged self ambulatory in satisfactory condition

## 2018-06-27 NOTE — Patient Instructions (Signed)
[facility] Discharge Instructions for Patients Receiving Chemotherapy   Beginning January 23rd 2017 lab work for the [facility] will be done in the  Main lab at [facility] on 1st floor. If you have a lab appointment with the [facility] please come in thru the  Main Entrance and check in at the main information desk   Today you received the following chemotherapy agents Oxaliplatin,Leucovorin and 5FU. Follow-up as scheduled, Call clinic for any questions or concerns  To help prevent nausea and vomiting after your treatment, we encourage you to take your nausea medication   If you develop nausea and vomiting, or diarrhea that is not controlled by your medication, call the clinic.  The clinic phone number is [phone]. Office hours are Monday-Friday 8:30am-5:00pm.  BELOW ARE SYMPTOMS THAT SHOULD BE REPORTED IMMEDIATELY:  *FEVER GREATER THAN 101.0 F  *CHILLS WITH OR WITHOUT FEVER  NAUSEA AND VOMITING THAT IS NOT CONTROLLED WITH YOUR NAUSEA MEDICATION  *UNUSUAL SHORTNESS OF BREATH  *UNUSUAL BRUISING OR BLEEDING  TENDERNESS IN MOUTH AND THROAT WITH OR WITHOUT PRESENCE OF ULCERS  *URINARY PROBLEMS  *BOWEL PROBLEMS  UNUSUAL RASH Items with * indicate a potential emergency and should be followed up as soon as possible. If you have an emergency after office hours please contact your primary care physician or go to the nearest emergency department.  Please call the clinic during office hours if you have any questions or concerns.   You may also contact the Patient Navigator at [phone] should you have any questions or need assistance in obtaining follow up care.      Resources For Cancer Patients and their Caregivers ? [employer]: Can assist with transportation, wigs, general needs, runs Look Good Feel Better.        4844338753 ? Cancer Care: Provides financial assistance, online support groups, medication/co-pay assistance.   1-800-813-HOPE 617-420-1974) ? [place] Assists [place] cancer patients and their families through emotional , educational and financial support.  (732)480-1074 ? [place] DSS Where to apply for food stamps, Medicaid and utility assistance. (910)618-0953 ? [facility]: Transportation to medical appointments. 607-159-1974 ? Social Security Administration: May apply for disability if have a Stage IV cancer. (412) 103-1179 (620) 428-9513 ? [employer], Disability and Transit Services: Assists with nutrition, care and transit needs. 9527567068

## 2018-06-28 ENCOUNTER — Other Ambulatory Visit: Payer: Self-pay

## 2018-06-29 ENCOUNTER — Inpatient Hospital Stay (HOSPITAL_COMMUNITY): Payer: 59

## 2018-06-29 ENCOUNTER — Encounter (HOSPITAL_COMMUNITY): Payer: Self-pay

## 2018-06-29 VITALS — BP 104/74 | HR 76 | Temp 97.9°F | Resp 16

## 2018-06-29 DIAGNOSIS — Z5111 Encounter for antineoplastic chemotherapy: Secondary | ICD-10-CM | POA: Diagnosis not present

## 2018-06-29 DIAGNOSIS — C787 Secondary malignant neoplasm of liver and intrahepatic bile duct: Secondary | ICD-10-CM | POA: Diagnosis not present

## 2018-06-29 DIAGNOSIS — C20 Malignant neoplasm of rectum: Secondary | ICD-10-CM | POA: Diagnosis not present

## 2018-06-29 DIAGNOSIS — Z85850 Personal history of malignant neoplasm of thyroid: Secondary | ICD-10-CM | POA: Diagnosis not present

## 2018-06-29 MED ORDER — HEPARIN SOD (PORK) LOCK FLUSH 100 UNIT/ML IV SOLN
500.0000 [IU] | Freq: Once | INTRAVENOUS | Status: AC | PRN
  Administered 2018-06-29: 500 [IU]

## 2018-06-29 MED ORDER — SODIUM CHLORIDE 0.9% FLUSH
10.0000 mL | INTRAVENOUS | Status: DC | PRN
  Administered 2018-06-29: 10 mL
  Filled 2018-06-29: qty 10

## 2018-06-29 NOTE — Progress Notes (Signed)
Samantha Reynolds returns today for port de access and flush after 46 hr continous infusion of 79fu. Tolerated infusion without problems. Portacath located left chest wall was  deaccessed and flushed with 31ml NS and 500U/70ml Heparin and needle removed intact.  Procedure without incident. Patient tolerated procedure well.

## 2018-06-30 MED FILL — ALPRAZolam 0.5 MG TABS: 0.5 | 30 days supply | Qty: 60 | Fill #0

## 2018-07-03 DIAGNOSIS — C20 Malignant neoplasm of rectum: Secondary | ICD-10-CM | POA: Diagnosis not present

## 2018-07-03 DIAGNOSIS — R911 Solitary pulmonary nodule: Secondary | ICD-10-CM | POA: Diagnosis not present

## 2018-07-03 DIAGNOSIS — C787 Secondary malignant neoplasm of liver and intrahepatic bile duct: Secondary | ICD-10-CM | POA: Diagnosis not present

## 2018-07-11 ENCOUNTER — Ambulatory Visit (HOSPITAL_COMMUNITY): Payer: 59

## 2018-07-11 ENCOUNTER — Ambulatory Visit (HOSPITAL_COMMUNITY): Payer: 59 | Admitting: Hematology

## 2018-07-13 ENCOUNTER — Encounter (HOSPITAL_COMMUNITY): Payer: 59

## 2018-07-13 ENCOUNTER — Other Ambulatory Visit: Payer: Self-pay

## 2018-07-13 ENCOUNTER — Other Ambulatory Visit (HOSPITAL_COMMUNITY): Payer: Self-pay | Admitting: Nurse Practitioner

## 2018-07-13 ENCOUNTER — Ambulatory Visit (HOSPITAL_COMMUNITY)
Admission: RE | Admit: 2018-07-13 | Discharge: 2018-07-13 | Disposition: A | Discharge status: Home / Self Care | Payer: 59 | Source: Ambulatory Visit | Attending: Nurse Practitioner | Admitting: Nurse Practitioner

## 2018-07-13 DIAGNOSIS — C19 Malignant neoplasm of rectosigmoid junction: Secondary | ICD-10-CM | POA: Diagnosis not present

## 2018-07-13 DIAGNOSIS — C787 Secondary malignant neoplasm of liver and intrahepatic bile duct: Secondary | ICD-10-CM | POA: Diagnosis not present

## 2018-07-13 DIAGNOSIS — C189 Malignant neoplasm of colon, unspecified: Secondary | ICD-10-CM | POA: Diagnosis not present

## 2018-07-13 DIAGNOSIS — C20 Malignant neoplasm of rectum: Secondary | ICD-10-CM

## 2018-07-13 NOTE — Progress Notes (Signed)
us

## 2018-07-16 ENCOUNTER — Ambulatory Visit: Payer: 59

## 2018-07-16 ENCOUNTER — Ambulatory Visit: Payer: 59 | Admitting: Radiation Oncology

## 2018-07-17 ENCOUNTER — Other Ambulatory Visit: Payer: Self-pay

## 2018-07-17 ENCOUNTER — Ambulatory Visit
Admission: RE | Admit: 2018-07-17 | Discharge: 2018-07-17 | Disposition: A | Discharge status: Home / Self Care | Payer: 59 | Source: Ambulatory Visit | Attending: Radiation Oncology | Admitting: Radiation Oncology

## 2018-07-17 ENCOUNTER — Encounter: Payer: Self-pay | Admitting: Radiation Oncology

## 2018-07-17 VITALS — Ht 66.5 in | Wt 167.0 lb

## 2018-07-17 DIAGNOSIS — C20 Malignant neoplasm of rectum: Secondary | ICD-10-CM

## 2018-07-17 DIAGNOSIS — K7689 Other specified diseases of liver: Secondary | ICD-10-CM | POA: Diagnosis not present

## 2018-07-17 DIAGNOSIS — Z9221 Personal history of antineoplastic chemotherapy: Secondary | ICD-10-CM | POA: Diagnosis not present

## 2018-07-17 DIAGNOSIS — R918 Other nonspecific abnormal finding of lung field: Secondary | ICD-10-CM | POA: Diagnosis not present

## 2018-07-17 NOTE — Progress Notes (Signed)
Radiation Oncology         [phone]) [phone]  Outpatient Consultation - Conducted via telephone due to current COVID-19 concerns for limiting patient exposure  I spoke with the patient to conduct this consult visit via telephone to spare the patient unnecessary potential exposure in the healthcare setting during the current COVID-19 pandemic. The patient was notified in advance and was offered a [place] meeting to allow for face to face communication but unfortunately reported that they did not have the appropriate resources/technology to support such a visit and instead preferred to proceed with a telephone consult.  ________________________________  Name: Samantha Reynolds        MRN: [id number]  Date of Service: 07/17/2018 DOB: 07-Sep-1965  PR:FFMBWG, Elayne Snare, MD  [name], MD     REFERRING PHYSICIAN: [name], MD   DIAGNOSIS: The encounter diagnosis was 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[place]).   HISTORY OF PRESENT ILLNESS: Samantha Reynolds is a 53 y.o. female seen at the request of Dr. Delton Coombes with a newly diagnosed rectal cancer. The patient has had 20 pounds of weight loss and intermittent rectal bleeding over the last year. She was evaluated by GI in 2017 and had a tubular adenoma. A colonoscopy on 02/19/18 revealed a polypoid, nonobstructing mass in the distal rectum. It involved one third of the lumen circumference. A biopsy revealed adenocarcinoma. She underwent a CT CAP on 02/21/18 that revealed a 9 mm LLL nodule and a 1.6 cm hypoattenuation in the right posterior lobe of the liver, and her MRI of the pelvis that day revealed T3N1-2 disease. She has met with Dr. Marcello Moores as well and at the time of her diagnosis was found to have disease in the liver and a questionable pulmonary nodule on PET in December 2019. She met with Dr. Delton Coombes about chemotherapy and has received 6 cycles of FOLFOX. 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 Her treatments made adjustments for oxaliplatin as well. She was able to complete her 6th cycle though on 06/05/2018. She had repeat PET imaging on 06/21/2018 that showed interval resolution of the lesion in the right hepatic lobe, and decrease in the rectal uptake, and of the regional nodes. She had cycle #7 on 06/27/2018. She went for ultrasound of the liver on 07/13/2018, and this could not be seen. She has been counseled on holding off on any intervention to the liver and to proceed with chemoRT. She is contacted by phone as she cannot participate in [employer].   PREVIOUS RADIATION THERAPY: No   PAST MEDICAL HISTORY:  Past Medical History:  Diagnosis Date   Anxiety    Cancer ([place]) 2013   thyroid   Endometrial polyp    Endometrial thickening on ultra sound    GERD (gastroesophageal reflux disease)    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   Worse in her 108s when she was ill   H/O Hashimoto thyroiditis    History of thyroid cancer no recurrence   2013--  s/p  left lobe thyroidectomy--  follicular varient papillary / lymphocyctic thyroiditis   Hyperlipidemia 11/09/2015   Hypothyroidism    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[place]) 02/26/2018       PAST SURGICAL HISTORY: Past Surgical History:  Procedure Laterality Date   BIOPSY  02/19/2018   Procedure: BIOPSY;  Surgeon: Rogene Houston, MD;  [facility];  Service: Endoscopy;;  rectum  COLONOSCOPY N/A 08/20/2015   Procedure: COLONOSCOPY;  Surgeon: Rogene Houston, MD;  [facility];  Service: Endoscopy;  Laterality: N/A;  [place] ENDOMETRIAL ABLATION  08-13-2004   FLEXIBLE SIGMOIDOSCOPY N/A 02/19/2018   Procedure: FLEXIBLE SIGMOIDOSCOPY;  Surgeon: Rogene Houston, MD;  [facility];  Service: Endoscopy;  Laterality: N/A;   HYSTEROSCOPY W/D&C N/A 04/30/2015   Procedure: DILATATION AND CURETTAGE / INTENDED HYSTEROSCOPY;   Surgeon: Dian Queen, MD;  Location: [facility];  Service: Gynecology;  Laterality: N/A;   IR US GUIDE BX ASP/DRAIN  03/09/2018   LAPAROSCOPIC CHOLECYSTECTOMY  04/1996   POLYPECTOMY  08/20/2015   Procedure: POLYPECTOMY;  Surgeon: Rogene Houston, MD;  [facility];  Service: Endoscopy;;  Splenic flexure polypectomy   POLYPECTOMY  02/19/2018   Procedure: POLYPECTOMY;  Surgeon: Rogene Houston, MD;  [facility];  Service: Endoscopy;;  rectum   PORTACATH PLACEMENT N/A 03/14/2018   Procedure: INSERTION PORT-A-CATH;  Surgeon: Aviva Signs, MD;  Location: [facility];  Service: General;  Laterality: N/A;   REDUCTION INCARCERATED UTERUS  06-20-2000   intrauterine preg. 13 wks /  urinary retention   THYROID LOBECTOMY  11/24/2011   Procedure: THYROID LOBECTOMY;  Surgeon: Earnstine Regal, MD;  Location: [facility];  Service: General;  Laterality: Left;  Left Thyroid Lobectomy   TUBAL LIGATION  2002     FAMILY HISTORY:  Family History  Problem Relation Age of Onset   Hypertension Mother    Hypertension Father    Prostate cancer Father    Cancer Maternal Aunt        spine/back   Cancer Paternal Grandfather        lung   Healthy Son    Healthy Son    Healthy Son    Healthy Son    Colon cancer Neg Hx      SOCIAL HISTORY:  reports that she quit smoking about 26 years ago. Her smoking use included cigarettes. She has a 2.50 pack-year smoking history. She has never used smokeless tobacco. She reports that she does not drink alcohol or use drugs. The patient is married and lives in [place]. 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   ALLERGIES: Demerol [meperidine]; Penicillins; Other; and Vancomycin   MEDICATIONS:  Current Outpatient Medications  Medication Sig Dispense Refill   ALPRAZolam (XANAX) 0.5 MG tablet Take 1 tablet (0.5 mg total) by mouth 2 (two) times daily as needed for  anxiety. 60 tablet 2   lidocaine-prilocaine (EMLA) cream Apply small amount to port site one hour prior to appointment and cover with plastic wrap. 30 g 3   thyroid (ARMOUR) 90 MG tablet Take 90 mg by mouth daily before breakfast.      docusate sodium (COLACE) 100 MG capsule Take 100 mg by mouth daily as needed for mild constipation.     fluorouracil CALGB 02409 in sodium chloride 0.9 % 150 mL Inject into the vein over 96 hr.     fluticasone (CUTIVATE) 0.05 % cream Apply 1 application topically 2 (two) times daily as needed. Skin irritation/rash  3   hydrocortisone cream 0.5 % Apply 1 application topically 2 (two) times daily. (Patient not taking: Reported on 07/17/2018) 30 g 0   lactulose (CHRONULAC) 10 GM/15ML solution Take 45 mLs (30 g total) by mouth at bedtime as needed for mild constipation. (Patient not  taking: Reported on 07/17/2018) 236 mL 0   leucovorin in dextrose 5 % 250 mL Inject into the vein once.     prochlorperazine (COMPAZINE) 10 MG tablet Take 1 tablet (10 mg total) by mouth every 6 (six) hours as needed for nausea or vomiting. (Patient not taking: Reported on 07/17/2018) 90 tablet 3   progesterone (PROMETRIUM) 100 MG capsule Take 100 mg by mouth at bedtime.     No current facility-administered medications for this encounter.    Facility-Administered Medications Ordered in Other Encounters  Medication Dose Route Frequency Provider Last Rate Last Dose   sodium chloride 0.9 % 1,000 mL with potassium chloride 20 mEq, magnesium sulfate 2 g infusion   Intravenous Once Derek Jack, MD         REVIEW OF SYSTEMS: On review of systems, the patient reports that she is doing well overall. The last week she's felt normal again since stopping chemo. She reports she did have very dry skin from one of her chemo agents and has had urinary urgency at times. She is still seeing a small amount of blood with BMs at times and occasional a piece of bloody tissue that passes. She  denies any pelvic pain at this time. She denies any chest pain, shortness of breath, cough, fevers, chills, night sweats, unintended weight changes. She denies any  nausea or vomiting. She denies any new musculoskeletal or joint aches or pains, new skin lesions or concerns. A complete review of systems is obtained and is otherwise negative.      PHYSICAL EXAM:  Wt Readings from Last 3 Encounters:  07/17/18 167 lb (75.8 kg)  06/26/18 164 lb 1.6 oz (74.4 kg)  06/12/18 164 lb 14.5 oz (74.8 kg)   Temp Readings from Last 3 Encounters:  06/29/18 97.9 F (36.6 C) (Oral)  06/27/18 97.9 F (36.6 C) (Oral)  06/26/18 (!) 97.5 F (36.4 C) (Oral)   BP Readings from Last 3 Encounters:  06/29/18 104/74  06/27/18 103/72  06/26/18 95/69   Pulse Readings from Last 3 Encounters:  06/29/18 76  06/27/18 94  06/26/18 (!) 111   Pain Assessment Pain Score: 0-No pain/10  Unable to assess due to nature of encounter.   ECOG = 1  0 - Asymptomatic (Fully active, able to carry on all predisease activities without restriction)  1 - Symptomatic but completely ambulatory (Restricted in physically strenuous activity but ambulatory and able to carry out work of a light or sedentary nature. For example, light housework, office work)  2 - Symptomatic, <50% in bed during the day (Ambulatory and capable of all self care but unable to carry out any work activities. Up and about more than 50% of waking hours)  3 - Symptomatic, >50% in bed, but not bedbound (Capable of only limited self-care, confined to bed or chair 50% or more of waking hours)  4 - Bedbound (Completely disabled. Cannot carry on any self-care. Totally confined to bed or chair)  5 - Death   Eustace Pen MM, Creech RH, Tormey DC, et al. [phone]). "Toxicity and response criteria of the [facility]". [place] Oncol. 5 (6): 649-55    LABORATORY DATA:  Lab Results  Component Value Date   WBC 3.3 (L) 06/26/2018   HGB 13.8  06/26/2018   HCT 41.7 06/26/2018   MCV 98.8 06/26/2018   PLT 120 (L) 06/26/2018   Lab Results  Component Value Date   NA 141 06/26/2018   K 3.3 (L) 06/26/2018  CL 107 06/26/2018   CO2 26 06/26/2018   Lab Results  Component Value Date   ALT 19 06/26/2018   AST 28 06/26/2018   ALKPHOS 93 06/26/2018   BILITOT 2.2 (H) 06/26/2018      RADIOGRAPHY: Mr Liver W Wo Contrast  Result Date: 06/25/2018 CLINICAL DATA:  Follow-up rectal carcinoma.  Hepatic metastasis. EXAM: MRI ABDOMEN WITHOUT AND WITH CONTRAST TECHNIQUE: Multiplanar multisequence MR imaging of the abdomen was performed both before and after the administration of intravenous contrast. CONTRAST:  7 mL Gadavist COMPARISON:  PET-CT 06/21/2018, MRI 03/08/2018, PET-CT 12 919 FINDINGS: Lower chest:  Lung bases are clear. Hepatobiliary: Previously identified lesion in the posterior RIGHT hepatic lobe identified on FDG PET scan and comparison MRI is subtly evident on delayed postcontrast imaging (image 37/18) as a 4 mm lesion decreased from 15 mm. No clear evidence of enhancement of this lesion. Lesion not identified on the diffusion-weighted imaging as previously present. No new hepatic lesions. Normal biliary tree. Pancreas: Normal pancreatic parenchymal intensity. No ductal dilatation or inflammation. Spleen: Spleen is enlarged measuring 14 cm in craniocaudad dimension. Benign nonenhancing cysts the upper pole of the spleen. Adrenals/urinary tract: Adrenal glands and kidneys are normal. Stomach/Bowel: Stomach and limited of the small bowel is unremarkable Vascular/Lymphatic: Abdominal aortic normal caliber. No retroperitoneal periportal lymphadenopathy. Musculoskeletal: No aggressive osseous lesion IMPRESSION: 1. Hepatic metastasis in the RIGHT hepatic lobe is decreased significantly in size and demonstrates no perceptible enhancement. Lesion measures 5 mm reduced from 15 mm. Lesion not appreciably hypermetabolic on most recent PET-CT scan. No  new hepatic metastasis. 2. Benign cyst of the spleen.  Stable moderate splenomegaly. Electronically Signed   By: Suzy Bouchard M.D.   On: 06/25/2018 09:21   Nm Pet Image Restag (ps) Skull Base To Thigh  Result Date: 06/21/2018 CLINICAL DATA:  Subsequent treatment strategy for malignant neoplasm of the rectum. EXAM: NUCLEAR MEDICINE PET SKULL BASE TO THIGH TECHNIQUE: 8.94 mCi F-18 FDG was injected intravenously. Full-ring PET imaging was performed from the skull base to thigh after the radiotracer. CT data was obtained and used for attenuation correction and anatomic localization. Fasting blood glucose: 71 mg/dl COMPARISON:  PET-CT 03/05/2018 FINDINGS: Mediastinal blood pool activity: SUV max 2.16 NECK: No hypermetabolic lymph nodes in the neck. Incidental CT findings: none CHEST: No hypermetabolic mediastinal or hilar nodes. No hypermetabolic pulmonary nodules on the CT scan. The previously noted 7 mm posteromedial left base nodule appears less solid and slightly smaller in size measuring 6 mm, image 127/3. Incidental CT findings: none ABDOMEN/PELVIS: There has been interval resolution of hypermetabolism associated with the previous right lobe of liver metastasis. Anatomic correlate for the treated liver lesion is difficult to identified on the corresponding unenhanced CT images. No new hypermetabolic liver lesions identified. No abnormal radiotracer activity within the pancreas. The spleen is enlarged measuring 13.4 cm in length. Increased from 12.9 cm previously. No focal areas of hypermetabolism identified. No abnormal uptake within the adrenal glands. No hypermetabolic abdominal or pelvic lymph nodes. Interval decrease in radiotracer uptake associated with rectal neoplasm. SUV max measures 5.04. This is compared with 17.5 previously. Previously noted FDG avid sigmoid mesocolon lymph nodes have resolved. Incidental CT findings: Aortic atherosclerosis.  No aneurysm. SKELETON: No focal hypermetabolic  activity to suggest skeletal metastasis. Incidental CT findings: none IMPRESSION: 1. Interval resolution of hypermetabolism associated with hypermetabolic right lobe of liver metastases. 2. Persistent but significantly improved radiotracer uptake associated with rectal neoplasm. 3. Slight improvement in posterior left lung base  nodule. Electronically Signed   By: Kerby Moors M.D.   On: 06/21/2018 13:37   US Abdomen Limited Ruq  Result Date: 07/13/2018 CLINICAL DATA:  Colorectal carcinoma with solitary liver metastasis confirmed on needle biopsy 03/09/2018. interval decrease in size of lesion on follow-up MRI. Percutaneous ablation is contemplated. EXAM: ULTRASOUND ABDOMEN LIMITED COMPARISON:  MR 06/25/2018 and previous FINDINGS: The right lobe liver lesion previously seen is no longer identified on ultrasound. No new liver lesion is identified. Liver parenchyma is homogeneous in echotexture. Antegrade color flow signal in the main portal vein. No biliary ductal dilatation. IMPRESSION: 1. The 4 mm solitary liver lesion seen on recent MRI is not identified on ultrasound. Electronically Signed   By: Lucrezia Europe M.D.   On: 07/13/2018 11:31       IMPRESSION/PLAN: 1. Stage IV adenocarcinoma of the rectum. Dr. Lisbeth Renshaw has reviewed her case but was unavailable for the phone call. We reviewed that if IR is not proceeding with liver ablation at this time, we would recommend moving forward with ChemoRT. She will see Dr. Delton Coombes next week to initiate the process of obtaining Xeloda. We discussed the risks, benefits, short, and long term effects of radiotherapy, and the patient is interested in proceeding. I  discussed the delivery and logistics of radiotherapy and that Dr. Lisbeth Renshaw recommends a course of 5 1/2 weeks of radiotherapy. We did spent time discussing options of hypofractionation, however more long term data is not available for this style of therapy and the patient is more comfortable with the traditional  course of treatment. We will plan to simulate next Tuesday with the plan to begin her treatment on 07/30/2018.  2. Gilbert's disease. She will follow up with Dr. Delton Coombes for managmenet of her Xeloda as he plans to dose modify this due to hyperbilirubinemia, and to follow with hepatology clinic at [facility].  3. Management of her pulmonary nodule and liver lesion. The patient's recent ultrasound showed resolution of her liver disease. She was counseled on a repeat CT of the CAP prior to proceeding with surgery so that if she needed liver ablation, this could be performed prior to surgery with Dr. Marcello Moores. We will follow this aspect expectantly.   Given current concerns for patient exposure during the COVID-19 pandemic, this encounter was conducted via telephone.  The patient has given verbal consent for this type of encounter. The time spent during this encounter was 45 minutes and 50% of that time was spent in the coordination of his care. The attendants for this meeting include Shona Simpson, [facility] and YAZHINI MCAULAY and her husband Kaydra Borgen. During the encounter, Shona Simpson [facility] was located at [facility] Radiation Oncology Department.  Josue Hector and her husband were located at home.      Carola Rhine, PAC

## 2018-07-17 NOTE — Progress Notes (Signed)
Patient reports having a small soft bowel movement this morning. Patient reports on three occasions over the last month she noted having the urge to have a bowel movement then passing only a dime size amount of blood and tissue. She reports her skin from the waist up remains extremely dry with cracks in it from the Vectibix. Reports all these findings to Shona Simpson, PA-C.

## 2018-07-17 NOTE — Progress Notes (Signed)
See progress note under physician encounter. 

## 2018-07-20 ENCOUNTER — Other Ambulatory Visit: Payer: Self-pay

## 2018-07-20 ENCOUNTER — Encounter (HOSPITAL_COMMUNITY): Payer: Self-pay | Admitting: Hematology

## 2018-07-20 ENCOUNTER — Inpatient Hospital Stay (HOSPITAL_BASED_OUTPATIENT_CLINIC_OR_DEPARTMENT_OTHER): Payer: 59 | Admitting: Hematology

## 2018-07-20 VITALS — BP 102/80 | HR 91 | Temp 98.2°F | Resp 18 | Wt 165.0 lb

## 2018-07-20 DIAGNOSIS — Z85850 Personal history of malignant neoplasm of thyroid: Secondary | ICD-10-CM

## 2018-07-20 DIAGNOSIS — C787 Secondary malignant neoplasm of liver and intrahepatic bile duct: Secondary | ICD-10-CM

## 2018-07-20 DIAGNOSIS — C20 Malignant neoplasm of rectum: Secondary | ICD-10-CM | POA: Diagnosis not present

## 2018-07-20 DIAGNOSIS — Z5111 Encounter for antineoplastic chemotherapy: Secondary | ICD-10-CM | POA: Diagnosis not present

## 2018-07-20 MED ORDER — CAPECITABINE 500 MG PO TABS: 1500.0000 mg | ORAL_TABLET | Freq: Two times a day (BID) | ORAL | 0 refills | Status: DC

## 2018-07-20 NOTE — Assessment & Plan Note (Signed)
1.  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 - Colonoscopy on 02/19/2018 shows rectal mass, 2 to 4 cm from the anal verge, normal sigmoid colon.  Biopsies consistent with adenocarcinoma. - MRI of the pelvis shows rectal adenocarcinoma, T stage-T3, N stage- N1-equivocal N2 nodes.  Maximum extension beyond muscularis propria is 6 mm (advanced T3).  Circumferential resection margin is 7 mm.  Right obturator node 6 mm.  Right external iliac node at 8 mm.  Craniocaudal extent of the tumor is 4.4 cm.  Approximately 180 degree circumferential.  Shortest distance from the distal tumor to anal sphincter is 2.1 cm.  Location from anal verge, 7.2 cm to the middle of the tumor. - CT CAP on 02/21/2018 shows 9 x 7 mm left lower lobe pulmonary nodule, new since 2009.  Possible subpleural left lower lobe 3 mm nodule versus area of pleural thickening.  Vague area of hypoattenuation in the posterior right hepatic lobe measuring 1.6 cm.  Differential includes benign/malignant lesion with altered perfusion.  Focal steatosis is possible. - MRI of the liver on 03/08/2018 shows 1.4 cm lesion in the posterior right hepatic lobe.  There is a 5 mm focus of delayed hyperenhancement in the lateral segment of the left liver, questionable for a second metastatic deposit. -PET CT scan on 03/05/2018 shows hypermetabolic rectal mass.  Left perirectal hypermetabolic and sigmoid mesocolon lymph nodes.  Single hepatic metastatic lesion in the right hepatic lobe.  7 mm pulmonary nodule at the left lung base is suspicious for meta stasis. -Liver biopsy on 03/09/2018 consistent with adenocarcinoma.  - Cycle 1 FOLFOX on 03/14/2018.  She had constipation lasting for 5 days.  She also had cold sensitivity for few days.  She had some  nausea but denied any vomiting. - Cycle 2 of FOLFOX on 03/27/2018. -She was admitted to the hospital from 03/27/2018 through 03/29/2018 with neutropenic fever and sepsis.  She received IV antibiotics. 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  Low starting dose of Irinotecan is indicated. -Because of her elevated total bilirubin to above 5, cycle 3 was given on 04/18/2018 without oxaliplatin.  However vectibix was started. - She did have 1 day of severe weakness after last chemotherapy.  However she did very well without cold sensitivity. -She was evaluated by Dr. Marcille Buffy at transplant hepatology clinic at [facility].  They felt that Irinotecan should be avoided. - Oxaliplatin was dose reduced by 20% during cycle 4 on 05/02/2018. - 7 cycles of FOLFOX with vectibix completed on 06/27/2018.  Vectibix was left out during cycle 7.   - PET scan on 06/21/2018 showed interval resolution of the hypermetabolic area associated with right lobe of the liver metastasis.  Spleen has slightly enlarged to 13.4 cm in length and 12.9 cm previously.  Interval decrease in the radiotracer uptake associated with rectal neoplasm with SUV of 5 compared to 17.5 previously.  Previously noted FDG avid sigmoid mesocolon lymph nodes have resolved. - MRI of the liver on 06/25/2018 shows decrease in size of the liver metastasis to 5 mm, from 15 mm previously.  No new lesions noted. -Patient attended a multidisciplinary consultation with physicians at [facility] as a virtual visit. -She was recommended to have ablative therapy to  the liver lesion.  She underwent ultrasound of her liver which did not show liver lesion.  Hence local treatment for liver lesion was put on hold at this time. -She was evaluated by Dr. Lisbeth Renshaw at [facility].  She will initiate radiation therapy to the rectum on 07/30/2018.  This will be followed by surgery by Dr. Marcello Moores. - Today we talked about  concurrent chemotherapy with Xeloda at 825 mg per metered square twice daily Monday through Friday throughout 5 weeks of radiation. -We talked about the various side effects in detail.  Her dose of Xeloda adjusted to her BSA is 1500 mg twice daily. 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  2.  EGFR antibody induced skin rash: -She has developed severe rash when she was receiving 8 weeks. -She still has some rash in the anterior chest wall which is improving.  3.  Thyroid cancer: -She had a left thyroidectomy done about 6 years ago.  She is on Synthroid at this time. -TSH, thyroglobulin and antithyroglobulin antibodies were normal.

## 2018-07-20 NOTE — Patient Instructions (Addendum)
[place] at [facility] Discharge Instructions  You were seen today by Dr. Delton Coombes. He went over your recent lab results. He will send in the Rx for the Xeloda for you to start. He will see you back in 3weeks for labs and follow up.   Thank you for choosing [place] at [facility] to provide your oncology and hematology care.  To afford each patient quality time with our provider, please arrive at least 15 minutes before your scheduled appointment time.   If you have a lab appointment with the  please come in thru the  Main Entrance and check in at the main information desk  You need to re-schedule your appointment should you arrive 10 or more minutes late.  We strive to give you quality time with our providers, and arriving late affects you and other patients whose appointments are after yours.  Also, if you no show three or more times for appointments you may be dismissed from the clinic at the providers discretion.     Again, thank you for choosing [facility].  Our hope is that these requests will decrease the amount of time that you wait before being seen by our physicians.       _____________________________________________________________  Should you have questions after your visit to [facility], please contact our office at [phone] between the hours of 8:00 a.m. and 4:30 p.m.  Voicemails left after 4:00 p.m. will not be returned until the following business day.  For prescription refill requests, have your pharmacy contact our office and allow 72 hours.    Cancer Center Support Programs:   > Cancer Support Group  2nd Tuesday of the month 1pm-2pm, Journey Room

## 2018-07-20 NOTE — Progress Notes (Signed)
[place], [place] [address]   CLINIC:  Medical Oncology/Hematology  PCP:  [name], MD [address] [place] [address] [phone]   REASON FOR VISIT:  Follow-up for rectal cancer  CURRENT THERAPY:Folfoxand vectibix   BRIEF ONCOLOGIC HISTORY:    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[facility])   02/26/2018 Initial Diagnosis    Rectal cancer ([facility])    03/13/2018 Cancer Staging    Staging form: Colon and Rectum, AJCC 8th Edition - Clinical stage from 03/13/2018: Stage IVA (cT3, cN1b, cM1a) - Signed by Derek Jack, MD on 03/13/2018    03/14/2018 -  Chemotherapy    The patient had palonosetron (ALOXI) injection 0.25 mg, 0.25 mg, Intravenous,  Once, 7 of 8 cycles Administration: 0.25 mg (03/14/2018), 0.25 mg (03/27/2018), 0.25 mg (04/18/2018), 0.25 mg (05/02/2018), 0.25 mg (05/16/2018), 0.25 mg (06/05/2018), 0.25 mg (06/27/2018) leucovorin 800 mg in dextrose 5 % 250 mL infusion, 772 mg, Intravenous,  Once, 7 of 8 cycles Administration: 800 mg (03/14/2018), 800 mg (03/27/2018), 800 mg (05/02/2018), 800 mg (05/16/2018), 700 mg (04/18/2018), 800 mg (06/05/2018), 800 mg (06/27/2018) oxaliplatin (ELOXATIN) 165 mg in dextrose 5 % 500 mL chemo infusion, 85 mg/m2 = 165 mg, Intravenous,  Once, 6 of 7 cycles Dose modification: 68 mg/m2 (80 % of original dose 85 mg/m2, Cycle 4, Reason: Provider Judgment) Administration: 165 mg (03/14/2018), 165 mg (03/27/2018), 130 mg (05/02/2018), 130 mg (05/16/2018), 130 mg (06/05/2018), 130 mg (06/27/2018) panitumumab (VECTIBIX) 500 mg in sodium chloride 0.9 % 100 mL chemo infusion, 480 mg, Intravenous,  Once, 4 of 5 cycles Administration: 500 mg (04/18/2018), 480 mg (05/02/2018), 480 mg (05/16/2018), 480 mg (06/05/2018) fluorouracil (ADRUCIL) chemo injection 750 mg, 400 mg/m2 = 750 mg (100 % of original dose 400 mg/m2), Intravenous,  Once, 6 of 7 cycles Dose modification: 400 mg/m2 (original dose 400 mg/m2, Cycle 1), 400  mg/m2 (original dose 400 mg/m2, Cycle 3) Administration: 750 mg (03/14/2018), 750 mg (04/18/2018), 750 mg (05/02/2018), 750 mg (05/16/2018), 750 mg (06/05/2018), 750 mg (06/27/2018) fosaprepitant (EMEND) 150 mg, dexamethasone (DECADRON) 12 mg in sodium chloride 0.9 % 145 mL IVPB, , Intravenous,  Once, 4 of 5 cycles Administration:  (05/02/2018),  (05/16/2018),  (06/05/2018),  (06/27/2018) fluorouracil (ADRUCIL) 4,650 mg in sodium chloride 0.9 % 57 mL chemo infusion, 2,400 mg/m2 = 4,650 mg, Intravenous, 1 Day/Dose, 7 of 8 cycles Administration: 4,650 mg (03/14/2018), 4,650 mg (03/27/2018), 4,650 mg (04/18/2018), 4,650 mg (05/02/2018), 4,650 mg (05/16/2018), 4,650 mg (06/05/2018), 4,650 mg (06/27/2018)  for chemotherapy treatment.       CANCER STAGING: Cancer Staging Malignant neoplasm of rectum [facility]) Staging form: Colon and Rectum, AJCC 8th Edition - Clinical stage from 03/13/2018: Stage IVA (cT3, cN1b, cM1a) - Signed by Derek Jack, MD on 03/13/2018    INTERVAL HISTORY:  Ms. Lett 53 y.o. female returns for routine follow-up and consideration for next cycle of chemotherapy. She is here today alone. She states that she continues to have tingling in her hands and feet of and on, not constant. She states that she has noticed hair growth on her face, eye brows and upper lip area. Denies any nausea, vomiting, or diarrhea. Denies any new pains. Had not noticed any recent bleeding such as epistaxis, hematuria or hematochezia. Denies recent chest pain on exertion, shortness of breath on minimal exertion, pre-syncopal episodes, or palpitations. Denies any recent fevers, infections, or recent hospitalizations. Patient reports appetite at 75% and energy level at 75%.  REVIEW OF SYSTEMS:  Review of Systems  Skin: Positive for itching and rash.  All other systems reviewed and are negative.    PAST MEDICAL/SURGICAL HISTORY:  Past Medical History:  Diagnosis Date  . Anxiety   . Cancer [facility]) 2013    thyroid  . Endometrial polyp   . Endometrial thickening on ultra sound   . GERD (gastroesophageal reflux disease)   . 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 H/O Hashimoto thyroiditis   . 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 Hyperlipidemia 11/09/2015  . Hypothyroidism   . 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[facility]) 02/26/2018   Past Surgical History:  Procedure Laterality Date  . BIOPSY  02/19/2018   Procedure: BIOPSY;  Surgeon: Rogene Houston, MD;  [facility];  Service: Endoscopy;;  rectum  . COLONOSCOPY N/A 08/20/2015   Procedure: COLONOSCOPY;  Surgeon: Rogene Houston, MD;  [facility];  Service: Endoscopy;  Laterality: N/A;  730  . [place] ENDOMETRIAL ABLATION  08-13-2004  . FLEXIBLE SIGMOIDOSCOPY N/A 02/19/2018   Procedure: FLEXIBLE SIGMOIDOSCOPY;  Surgeon: Rogene Houston, MD;  [facility];  Service: Endoscopy;  Laterality: N/A;  . HYSTEROSCOPY W/D&C N/A 04/30/2015   Procedure: DILATATION AND CURETTAGE / INTENDED HYSTEROSCOPY;  Surgeon: Dian Queen, MD;  Location: [facility];  Service: Gynecology;  Laterality: N/A;  . IR US GUIDE BX ASP/DRAIN  03/09/2018  . LAPAROSCOPIC CHOLECYSTECTOMY  04/1996  . POLYPECTOMY  08/20/2015   Procedure: POLYPECTOMY;  Surgeon: Rogene Houston, MD;  [facility];  Service: Endoscopy;;  Splenic flexure polypectomy  . POLYPECTOMY  02/19/2018   Procedure: POLYPECTOMY;  Surgeon: Rogene Houston, MD;  [facility];  Service: Endoscopy;;  rectum  . PORTACATH PLACEMENT N/A 03/14/2018   Procedure: INSERTION PORT-A-CATH;  Surgeon: Aviva Signs, MD;  Location: [facility];  Service: General;  Laterality: N/A;  . REDUCTION INCARCERATED UTERUS  06-20-2000   intrauterine preg. 13 wks /  urinary retention  . THYROID LOBECTOMY  11/24/2011    Procedure: THYROID LOBECTOMY;  Surgeon: Earnstine Regal, MD;  Location: [facility];  Service: General;  Laterality: Left;  Left Thyroid Lobectomy  . TUBAL LIGATION  2002     SOCIAL HISTORY:  Social History   Socioeconomic History  . Marital status: Married    Spouse name: Not on file  . Number of children: 4  . Years of education: Not on file  . Highest education level: Not on file  Occupational History    Comment: Marketing executive at [employer]  . Financial resource strain: Not hard at all  . Food insecurity:    Worry: Never true    Inability: Never true  . Transportation needs:    Medical: No    Non-medical: No  Tobacco Use  . Smoking status: Former Smoker    Packs/day: 0.25    Years: 10.00    Pack years: 2.50    Types: Cigarettes    Last attempt to quit: 10/31/1991    Years since quitting: 26.7  . Smokeless tobacco: Never Used  Substance and Sexual Activity  . Alcohol use: No  . Drug use: No  . Sexual activity: Not Currently  Lifestyle  . Physical activity:    Days per week: 0 days  Minutes per session: 0 min  . Stress: To some extent  Relationships  . Social connections:    Talks on phone: More than three times a week    Gets together: Twice a week    Attends religious service: More than 4 times per year    Active member of club or organization: Yes    Attends meetings of clubs or organizations: More than 4 times per year    Relationship status: Married  . Intimate partner violence:    Fear of current or ex partner: No    Emotionally abused: No    Physically abused: No    Forced sexual activity: No  Other Topics Concern  . Not on file  Social History Narrative  . Not on file    FAMILY HISTORY:  Family History  Problem Relation Age of Onset  . Hypertension Mother   . Hypertension Father   . Prostate cancer Father   . Cancer Maternal Aunt        spine/back  . Cancer Paternal Grandfather        lung  . Healthy Son   . Healthy Son   . Healthy  Son   . Healthy Son   . Colon cancer Neg Hx     CURRENT MEDICATIONS:  Outpatient Encounter Medications as of 07/20/2018  Medication Sig  . ALPRAZolam (XANAX) 0.5 MG tablet Take 1 tablet (0.5 mg total) by mouth 2 (two) times daily as needed for anxiety.  . capecitabine (XELODA) 500 MG tablet Take 3 tablets (1,500 mg total) by mouth 2 (two) times daily after a meal. Take Monday through Friday throughout the course of radiation.  . lidocaine-prilocaine (EMLA) cream Apply small amount to port site one hour prior to appointment and cover with plastic wrap.  . thyroid (ARMOUR) 90 MG tablet Take 90 mg by mouth daily before breakfast.    Facility-Administered Encounter Medications as of 07/20/2018  Medication  . sodium chloride 0.9 % 1,000 mL with potassium chloride 20 mEq, magnesium sulfate 2 g infusion    ALLERGIES:  Please see epic for details.  PHYSICAL EXAM:  ECOG Performance status: 0  Vitals:   07/20/18 0825  BP: 102/80  Pulse: 91  Resp: 18  Temp: 98.2 F (36.8 C)   Filed Weights   07/20/18 0825  Weight: 165 lb (74.8 kg)    Physical Exam Vitals signs reviewed.  Constitutional:      Appearance: Normal appearance.  Cardiovascular:     Rate and Rhythm: Normal rate and regular rhythm.     Heart sounds: Normal heart sounds.  Pulmonary:     Effort: Pulmonary effort is normal.     Breath sounds: Normal breath sounds.  Abdominal:     General: There is no distension.     Palpations: Abdomen is soft. There is no mass.  Musculoskeletal:        General: No swelling.  Skin:    General: Skin is warm.  Neurological:     General: No focal deficit present.     Mental Status: She is alert and oriented to person, place, and time.  Psychiatric:        Mood and Affect: Mood normal.        Behavior: Behavior normal.      LABORATORY DATA:  I have reviewed the labs as listed.  CBC    Component Value Date/Time   WBC 3.3 (L) 06/26/2018 1046   RBC 4.22 06/26/2018 1046   HGB  13.8  06/26/2018 1046   HGB 13.5 11/03/2015 0841   HCT 41.7 06/26/2018 1046   HCT 39.7 11/03/2015 0841   PLT 120 (L) 06/26/2018 1046   PLT 165 11/03/2015 0841   MCV 98.8 06/26/2018 1046   MCV 95 11/03/2015 0841   MCH 32.7 06/26/2018 1046   MCHC 33.1 06/26/2018 1046   RDW 13.8 06/26/2018 1046   RDW 13.2 11/03/2015 0841   LYMPHSABS 1.1 06/26/2018 1046   LYMPHSABS 1.4 11/03/2015 0841   MONOABS 0.5 06/26/2018 1046   EOSABS 0.0 06/26/2018 1046   EOSABS 0.1 11/03/2015 0841   BASOSABS 0.0 06/26/2018 1046   BASOSABS 0.0 11/03/2015 0841   CMP Latest Ref Rng & Units 06/26/2018 06/04/2018 05/15/2018  Glucose 70 - 99 mg/dL 93 129(H) 99  BUN 6 - 20 mg/dL _0 Creatinine 0.44 - 1.00 mg/dL 0.72 0.63 0.62  Sodium 135 - 145 mmol/L 141 139 138  Potassium 3.5 - 5.1 mmol/L 3.3(L) 3.1(L) 3.2(L)  Chloride 98 - 111 mmol/L 107 105 106  CO2 22 - 32 mmol/L _1 Calcium 8.9 - 10.3 mg/dL 9.0 9.1 9.0  Total Protein 6.5 - 8.1 g/dL 7.2 7.3 7.1  Total Bilirubin 0.3 - 1.2 mg/dL 2.2(H) 1.6(H) 1.9(H)  Alkaline Phos 38 - 126 U/L 93 82 69  AST 15 - 41 U/L 28 30 49(H)  ALT 0 - 44 U/L 19 23 45(H)       DIAGNOSTIC IMAGING:  I have independently reviewed the scans and discussed with the patient.   I have reviewed Venita Lick LPN's note and agree with the documentation.  I personally performed a face-to-face visit, made revisions and my assessment and plan is as follows.    ASSESSMENT & PLAN:   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[facility]) 1.  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 - Colonoscopy on 02/19/2018 shows rectal mass, 2 to 4 cm from the anal verge, normal sigmoid colon.  Biopsies consistent with adenocarcinoma. - MRI of the pelvis shows rectal adenocarcinoma, T stage-T3, N stage-  N1-equivocal N2 nodes.  Maximum extension beyond muscularis propria is 6 mm (advanced T3).  Circumferential resection margin is 7 mm.  Right obturator node 6 mm.  Right external iliac node at 8 mm.  Craniocaudal extent of the tumor is 4.4 cm.  Approximately 180 degree circumferential.  Shortest distance from the distal tumor to anal sphincter is 2.1 cm.  Location from anal verge, 7.2 cm to the middle of the tumor. - CT CAP on 02/21/2018 shows 9 x 7 mm left lower lobe pulmonary nodule, new since 2009.  Possible subpleural left lower lobe 3 mm nodule versus area of pleural thickening.  Vague area of hypoattenuation in the posterior right hepatic lobe measuring 1.6 cm.  Differential includes benign/malignant lesion with altered perfusion.  Focal steatosis is possible. - MRI of the liver on 03/08/2018 shows 1.4 cm lesion in the posterior right hepatic lobe.  There is a 5 mm focus of delayed hyperenhancement in the lateral segment of the left liver, questionable for a second metastatic deposit. -PET CT scan on 03/05/2018 shows hypermetabolic rectal mass.  Left perirectal hypermetabolic and sigmoid mesocolon lymph nodes.  Single hepatic metastatic lesion in the right hepatic lobe.  7 mm pulmonary nodule at the left lung base is suspicious  for meta stasis. -Liver biopsy on 03/09/2018 consistent with adenocarcinoma.  - Cycle 1 FOLFOX on 03/14/2018.  She had constipation lasting for 5 days.  She also had cold sensitivity for few days.  She had some nausea but denied any vomiting. - Cycle 2 of FOLFOX on 03/27/2018. -She was admitted to the hospital from 03/27/2018 through 03/29/2018 with neutropenic fever and sepsis.  She received IV antibiotics. 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  Low starting dose of Irinotecan is indicated. -Because of her elevated total bilirubin to above 5, cycle 3 was given on 04/18/2018 without oxaliplatin.  However  vectibix was started. - She did have 1 day of severe weakness after last chemotherapy.  However she did very well without cold sensitivity. -She was evaluated by Dr. Marcille Buffy at transplant hepatology clinic at [facility].  They felt that Irinotecan should be avoided. - Oxaliplatin was dose reduced by 20% during cycle 4 on 05/02/2018. - 7 cycles of FOLFOX with vectibix completed on 06/27/2018.  Vectibix was left out during cycle 7.   - PET scan on 06/21/2018 showed interval resolution of the hypermetabolic area associated with right lobe of the liver metastasis.  Spleen has slightly enlarged to 13.4 cm in length and 12.9 cm previously.  Interval decrease in the radiotracer uptake associated with rectal neoplasm with SUV of 5 compared to 17.5 previously.  Previously noted FDG avid sigmoid mesocolon lymph nodes have resolved. - MRI of the liver on 06/25/2018 shows decrease in size of the liver metastasis to 5 mm, from 15 mm previously.  No new lesions noted. -Patient attended a multidisciplinary consultation with physicians at [facility] as a virtual visit. -She was recommended to have ablative therapy to the liver lesion.  She underwent ultrasound of her liver which did not show liver lesion.  Hence local treatment for liver lesion was put on hold at this time. -She was evaluated by Dr. Lisbeth Renshaw at [place] [facility].  She will initiate radiation therapy to the rectum on 07/30/2018.  This will be followed by surgery by Dr. Marcello Moores. - Today we talked about concurrent chemotherapy with Xeloda at 825 mg per metered square twice daily Monday through Friday throughout 5 weeks of radiation. -We talked about the various side effects in detail.  Her dose of Xeloda adjusted to her BSA is 1500 mg twice daily. 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  2.  EGFR antibody induced skin rash: -She has developed severe rash when she was receiving 8 weeks. -She still has some rash in the anterior chest wall  which is improving.  3.  Thyroid cancer: -She had a left thyroidectomy done about 6 years ago.  She is on Synthroid at this time. -TSH, thyroglobulin and antithyroglobulin antibodies were normal.      Orders placed this encounter:  No orders of the defined types were placed in this encounter.     Derek Jack, MD [place] [phone]

## 2018-07-20 NOTE — Progress Notes (Signed)
I met with patient after visit with Dr. Delton Coombes. I provided her with written material on Xeloda that she will be taking with radiation.  She was given the opportunity to ask questions and all were answered to her satisfaction.

## 2018-07-23 ENCOUNTER — Telehealth (HOSPITAL_COMMUNITY): Payer: Self-pay | Admitting: Pharmacy Technician

## 2018-07-23 ENCOUNTER — Telehealth (HOSPITAL_COMMUNITY): Payer: Self-pay | Admitting: Pharmacist

## 2018-07-23 DIAGNOSIS — C20 Malignant neoplasm of rectum: Secondary | ICD-10-CM

## 2018-07-23 MED ORDER — CAPECITABINE 500 MG PO TABS: 1500.0000 mg | ORAL_TABLET | Freq: Two times a day (BID) | ORAL | 0 refills | Status: DC

## 2018-07-23 NOTE — Telephone Encounter (Signed)
Oral Chemotherapy Pharmacist Encounter  Xeloda will be delivered on 4/29. She knows that she will not start the Xeloda until the start of her radiation.   Patient Education I spoke with patient for overview of new oral chemotherapy medication: Xeloda (capecitabine) for the treatment of stage IV rectal adenocarcinoma in conjunction with RT, planned duration until the end of radiation, ~5 weeks.  Pt is doing well. Counseled patient on administration, dosing, side effects, monitoring, drug-food interactions, safe handling, storage, and disposal. Patient will take 3 tablets (1,500 mg total) by mouth 2 (two) times daily after a meal. Take Monday through Friday throughout the course of radiation.  Side effects include but not limited to: diarrhea, N/V, decreased wbc/plt/hgb, hand-foot syndrome, fatigue.    Reviewed with patient importance of keeping a medication schedule and plan for any missed doses.  Ms. Coppernoll voiced understanding and appreciation.  All questions answered. Medication handout placed in the mail.  Provided patient with [facility] phone number. Patient knows to call the office with questions or concerns. Oral Chemotherapy Navigation Clinic will continue to follow.  Darl Pikes, PharmD, BCPS, [facility] Hematology/Oncology Clinical Pharmacist [facility] [phone]  07/23/2018 2:57 PM

## 2018-07-23 NOTE — Telephone Encounter (Signed)
Oral Oncology Pharmacist Encounter  Received new prescription for Xeloda (capecitabine) for the treatment of stage IV rectal adenocarcinoma in conjunction with RT, planned duration until the end of radiation, ~5 weeks.  CMP from 06/26/2018 assessed, no relevant lab abnormalities. Prescription dose and frequency assessed.   Current medication list in Epic reviewed, no DDIs with Xeloda identified.  Prescription has been e-scribed to the [facility] for benefits analysis and approval.  Oral Oncology Clinic will continue to follow for insurance authorization, copayment issues, initial counseling and start date.  Darl Pikes, PharmD, BCPS, [facility] Hematology/Oncology Clinical Pharmacist [facility] [phone]  07/23/2018 9:25 AM

## 2018-07-23 NOTE — Telephone Encounter (Signed)
Oral Oncology Patient Advocate Encounter  After completing a benefits investigation, prior authorization for Xeloda (Capecitabine) is still effective from previous approval on 03/05/2018.  Per [employer], this approval is for a max of 12 fills between 03/05/18-03/05/2019.  Patient's copay is $0.00.  I will call the patient to set up delivery of her medication.  [place] Patient Samantha Reynolds Phone [phone] Fax [phone] 07/23/2018 10:49 AM

## 2018-07-23 NOTE — Telephone Encounter (Signed)
Spoke with patient and scheduled delivery of Xeloda to her home for 4/29.  Patient is aware that she will need a refill to cover the remaining treatment days.

## 2018-07-24 ENCOUNTER — Other Ambulatory Visit: Payer: Self-pay

## 2018-07-24 ENCOUNTER — Ambulatory Visit
Admission: RE | Admit: 2018-07-24 | Discharge: 2018-07-24 | Disposition: A | Discharge status: Home / Self Care | Payer: 59 | Source: Ambulatory Visit | Attending: Radiation Oncology | Admitting: Radiation Oncology

## 2018-07-24 DIAGNOSIS — C20 Malignant neoplasm of rectum: Secondary | ICD-10-CM | POA: Insufficient documentation

## 2018-07-24 DIAGNOSIS — Z510 Encounter for antineoplastic radiation therapy: Secondary | ICD-10-CM | POA: Diagnosis not present

## 2018-07-24 MED FILL — CAPECITABINE 500 MG TABS: 500 | 28 days supply | Qty: 120 | Fill #0

## 2018-07-25 MED FILL — ARMOUR THYROID 90 MG TABLET: 90 | 90 days supply | Qty: 90 | Fill #0

## 2018-07-25 NOTE — Progress Notes (Signed)
  Radiation Oncology         [phone] ________________________________  Name: Samantha Reynolds MRN: [id number]  Date: 07/24/2018  DOB: [birth year]   SIMULATION AND TREATMENT PLANNING NOTE  DIAGNOSIS:     ICD-10-CM   1. 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[place]) C20      The patient presented for simulation for the patient's upcoming course of radiation for the diagnosis of rectal cancer. The patient was placed in a supine position. A customized vac-lock bag was constructed to aid in patient immobilization on. This complex treatment device will be used on a daily basis during the treatment. In this fashion a CT scan was obtained through the pelvic region and the isocenter was placed near midline within the pelvis. Surface markings were placed.  The patient's imaging was loaded into the radiation treatment planning system. The patient will initially be planned to receive a course of radiation to a dose of 45 Gy. This will be accomplished in 25 fractions at 1.8 gray per fraction. This initial treatment will correspond to a 3-D conformal technique. The target has been contoured in addition to the rectum, bladder and femoral heads. Dose volume histograms of each of these structures have been requested and these will be carefully reviewed as part of the 3-D conformal treatment planning process. To accomplish this initial treatment, 4 customized blocks have been designed for this purpose. Each of these 4 complex treatment devices will be used on a daily basis during the initial course of the treatment. It is anticipated that the patient will then receive a boost for an additional 5.4 Gy. The anticipated total dose therefore will be 50.4 Gy.    Special treatment procedure The patient will receive chemotherapy during the course of radiation treatment. The patient may experience increased or overlapping toxicity due to this combined-modality approach and the patient will be monitored for such problems. This may  include extra lab work as necessary. This therefore constitutes a special treatment procedure.    ________________________________  Jodelle Gross, MD, PhD

## 2018-07-25 NOTE — Progress Notes (Signed)
  Radiation Oncology         [phone]) [phone] ________________________________  Name: Samantha Reynolds MRN: [id number]  Date: 07/24/2018  DOB: 07/21/1965  Optical Surface Tracking Plan:  Since intensity modulated radiotherapy (IMRT) and 3D conformal radiation treatment methods are predicated on accurate and precise positioning for treatment, intrafraction motion monitoring is medically necessary to ensure accurate and safe treatment delivery.  The ability to quantify intrafraction motion without excessive ionizing radiation dose can only be performed with optical surface tracking. Accordingly, surface imaging offers the opportunity to obtain 3D measurements of patient position throughout IMRT and 3D treatments without excessive radiation exposure.  I am ordering optical surface tracking for this patient's upcoming course of radiotherapy. ________________________________  Kyung Rudd, MD 07/25/2018 3:25 PM    Reference:   Ursula Alert, J, et al. Surface imaging-based analysis of intrafraction motion for breast radiotherapy patients.Journal of [place], n. 6, nov. 2014. ISSN [id number].   Available at: <http://www.jacmp.org/index.php/jacmp/article/view/4957>.

## 2018-07-26 DIAGNOSIS — Z510 Encounter for antineoplastic radiation therapy: Secondary | ICD-10-CM | POA: Diagnosis not present

## 2018-07-26 DIAGNOSIS — C20 Malignant neoplasm of rectum: Secondary | ICD-10-CM | POA: Diagnosis not present

## 2018-07-30 ENCOUNTER — Ambulatory Visit
Admission: RE | Admit: 2018-07-30 | Discharge: 2018-07-30 | Disposition: A | Discharge status: Home / Self Care | Payer: 59 | Source: Ambulatory Visit | Attending: Radiation Oncology | Admitting: Radiation Oncology

## 2018-07-30 ENCOUNTER — Other Ambulatory Visit: Payer: Self-pay

## 2018-07-30 DIAGNOSIS — C787 Secondary malignant neoplasm of liver and intrahepatic bile duct: Secondary | ICD-10-CM | POA: Diagnosis not present

## 2018-07-30 DIAGNOSIS — R634 Abnormal weight loss: Secondary | ICD-10-CM | POA: Diagnosis not present

## 2018-07-30 DIAGNOSIS — F419 Anxiety disorder, unspecified: Secondary | ICD-10-CM | POA: Diagnosis not present

## 2018-07-30 DIAGNOSIS — K625 Hemorrhage of anus and rectum: Secondary | ICD-10-CM | POA: Diagnosis not present

## 2018-07-30 DIAGNOSIS — R197 Diarrhea, unspecified: Secondary | ICD-10-CM | POA: Diagnosis not present

## 2018-07-30 DIAGNOSIS — Z85850 Personal history of malignant neoplasm of thyroid: Secondary | ICD-10-CM | POA: Diagnosis not present

## 2018-07-30 DIAGNOSIS — R110 Nausea: Secondary | ICD-10-CM | POA: Diagnosis not present

## 2018-07-30 DIAGNOSIS — Z510 Encounter for antineoplastic radiation therapy: Secondary | ICD-10-CM | POA: Diagnosis not present

## 2018-07-30 DIAGNOSIS — K5900 Constipation, unspecified: Secondary | ICD-10-CM | POA: Diagnosis not present

## 2018-07-30 DIAGNOSIS — Z79899 Other long term (current) drug therapy: Secondary | ICD-10-CM | POA: Diagnosis not present

## 2018-07-30 DIAGNOSIS — Z8249 Family history of ischemic heart disease and other diseases of the circulatory system: Secondary | ICD-10-CM | POA: Diagnosis not present

## 2018-07-30 DIAGNOSIS — Z8042 Family history of malignant neoplasm of prostate: Secondary | ICD-10-CM | POA: Diagnosis not present

## 2018-07-30 DIAGNOSIS — C20 Malignant neoplasm of rectum: Secondary | ICD-10-CM | POA: Insufficient documentation

## 2018-07-30 DIAGNOSIS — R5383 Other fatigue: Secondary | ICD-10-CM | POA: Diagnosis not present

## 2018-07-30 DIAGNOSIS — E785 Hyperlipidemia, unspecified: Secondary | ICD-10-CM | POA: Diagnosis not present

## 2018-07-30 DIAGNOSIS — Z87891 Personal history of nicotine dependence: Secondary | ICD-10-CM | POA: Diagnosis not present

## 2018-07-31 ENCOUNTER — Other Ambulatory Visit (HOSPITAL_COMMUNITY): Payer: Self-pay | Admitting: *Deleted

## 2018-07-31 ENCOUNTER — Ambulatory Visit
Admission: RE | Admit: 2018-07-31 | Discharge: 2018-07-31 | Disposition: A | Discharge status: Home / Self Care | Payer: 59 | Source: Ambulatory Visit | Attending: Radiation Oncology | Admitting: Radiation Oncology

## 2018-07-31 ENCOUNTER — Other Ambulatory Visit: Payer: Self-pay

## 2018-07-31 DIAGNOSIS — R110 Nausea: Secondary | ICD-10-CM | POA: Diagnosis not present

## 2018-07-31 DIAGNOSIS — K625 Hemorrhage of anus and rectum: Secondary | ICD-10-CM | POA: Diagnosis not present

## 2018-07-31 DIAGNOSIS — R5383 Other fatigue: Secondary | ICD-10-CM | POA: Diagnosis not present

## 2018-07-31 DIAGNOSIS — E785 Hyperlipidemia, unspecified: Secondary | ICD-10-CM | POA: Diagnosis not present

## 2018-07-31 DIAGNOSIS — R197 Diarrhea, unspecified: Secondary | ICD-10-CM | POA: Diagnosis not present

## 2018-07-31 DIAGNOSIS — C787 Secondary malignant neoplasm of liver and intrahepatic bile duct: Secondary | ICD-10-CM | POA: Diagnosis not present

## 2018-07-31 DIAGNOSIS — R634 Abnormal weight loss: Secondary | ICD-10-CM | POA: Diagnosis not present

## 2018-07-31 DIAGNOSIS — K5900 Constipation, unspecified: Secondary | ICD-10-CM | POA: Diagnosis not present

## 2018-07-31 DIAGNOSIS — C20 Malignant neoplasm of rectum: Secondary | ICD-10-CM | POA: Diagnosis not present

## 2018-07-31 MED ORDER — ONDANSETRON HCL 4 MG PO TABS: 4.0000 mg | ORAL_TABLET | Freq: Three times a day (TID) | ORAL | 2 refills | Status: DC | PRN

## 2018-08-01 ENCOUNTER — Other Ambulatory Visit: Payer: Self-pay

## 2018-08-01 ENCOUNTER — Ambulatory Visit
Admission: RE | Admit: 2018-08-01 | Discharge: 2018-08-01 | Disposition: A | Discharge status: Home / Self Care | Payer: 59 | Source: Ambulatory Visit | Attending: Radiation Oncology | Admitting: Radiation Oncology

## 2018-08-01 DIAGNOSIS — E785 Hyperlipidemia, unspecified: Secondary | ICD-10-CM | POA: Diagnosis not present

## 2018-08-01 DIAGNOSIS — C787 Secondary malignant neoplasm of liver and intrahepatic bile duct: Secondary | ICD-10-CM | POA: Diagnosis not present

## 2018-08-01 DIAGNOSIS — R5383 Other fatigue: Secondary | ICD-10-CM | POA: Diagnosis not present

## 2018-08-01 DIAGNOSIS — R634 Abnormal weight loss: Secondary | ICD-10-CM | POA: Diagnosis not present

## 2018-08-01 DIAGNOSIS — K5900 Constipation, unspecified: Secondary | ICD-10-CM | POA: Diagnosis not present

## 2018-08-01 DIAGNOSIS — K625 Hemorrhage of anus and rectum: Secondary | ICD-10-CM | POA: Diagnosis not present

## 2018-08-01 DIAGNOSIS — C20 Malignant neoplasm of rectum: Secondary | ICD-10-CM | POA: Diagnosis not present

## 2018-08-01 DIAGNOSIS — R110 Nausea: Secondary | ICD-10-CM | POA: Diagnosis not present

## 2018-08-01 DIAGNOSIS — R197 Diarrhea, unspecified: Secondary | ICD-10-CM | POA: Diagnosis not present

## 2018-08-02 ENCOUNTER — Ambulatory Visit
Admission: RE | Admit: 2018-08-02 | Discharge: 2018-08-02 | Disposition: A | Discharge status: Home / Self Care | Payer: 59 | Source: Ambulatory Visit | Attending: Radiation Oncology | Admitting: Radiation Oncology

## 2018-08-02 ENCOUNTER — Other Ambulatory Visit: Payer: Self-pay

## 2018-08-02 DIAGNOSIS — E785 Hyperlipidemia, unspecified: Secondary | ICD-10-CM | POA: Diagnosis not present

## 2018-08-02 DIAGNOSIS — K625 Hemorrhage of anus and rectum: Secondary | ICD-10-CM | POA: Diagnosis not present

## 2018-08-02 DIAGNOSIS — R110 Nausea: Secondary | ICD-10-CM | POA: Diagnosis not present

## 2018-08-02 DIAGNOSIS — R197 Diarrhea, unspecified: Secondary | ICD-10-CM | POA: Diagnosis not present

## 2018-08-02 DIAGNOSIS — R634 Abnormal weight loss: Secondary | ICD-10-CM | POA: Diagnosis not present

## 2018-08-02 DIAGNOSIS — K5900 Constipation, unspecified: Secondary | ICD-10-CM | POA: Diagnosis not present

## 2018-08-02 DIAGNOSIS — R5383 Other fatigue: Secondary | ICD-10-CM | POA: Diagnosis not present

## 2018-08-02 DIAGNOSIS — C787 Secondary malignant neoplasm of liver and intrahepatic bile duct: Secondary | ICD-10-CM | POA: Diagnosis not present

## 2018-08-02 DIAGNOSIS — C20 Malignant neoplasm of rectum: Secondary | ICD-10-CM | POA: Diagnosis not present

## 2018-08-03 ENCOUNTER — Other Ambulatory Visit: Payer: Self-pay

## 2018-08-03 ENCOUNTER — Ambulatory Visit
Admission: RE | Admit: 2018-08-03 | Discharge: 2018-08-03 | Disposition: A | Discharge status: Home / Self Care | Payer: 59 | Source: Ambulatory Visit | Attending: Radiation Oncology | Admitting: Radiation Oncology

## 2018-08-03 DIAGNOSIS — C20 Malignant neoplasm of rectum: Secondary | ICD-10-CM | POA: Diagnosis not present

## 2018-08-03 DIAGNOSIS — E785 Hyperlipidemia, unspecified: Secondary | ICD-10-CM | POA: Diagnosis not present

## 2018-08-03 DIAGNOSIS — R634 Abnormal weight loss: Secondary | ICD-10-CM | POA: Diagnosis not present

## 2018-08-03 DIAGNOSIS — R5383 Other fatigue: Secondary | ICD-10-CM | POA: Diagnosis not present

## 2018-08-03 DIAGNOSIS — K625 Hemorrhage of anus and rectum: Secondary | ICD-10-CM | POA: Diagnosis not present

## 2018-08-03 DIAGNOSIS — K5900 Constipation, unspecified: Secondary | ICD-10-CM | POA: Diagnosis not present

## 2018-08-03 DIAGNOSIS — R110 Nausea: Secondary | ICD-10-CM | POA: Diagnosis not present

## 2018-08-03 DIAGNOSIS — R197 Diarrhea, unspecified: Secondary | ICD-10-CM | POA: Diagnosis not present

## 2018-08-03 DIAGNOSIS — C787 Secondary malignant neoplasm of liver and intrahepatic bile duct: Secondary | ICD-10-CM | POA: Diagnosis not present

## 2018-08-06 ENCOUNTER — Encounter (HOSPITAL_COMMUNITY): Payer: Self-pay | Admitting: Hematology

## 2018-08-06 ENCOUNTER — Inpatient Hospital Stay (HOSPITAL_COMMUNITY): Payer: 59 | Attending: Hematology | Admitting: Hematology

## 2018-08-06 ENCOUNTER — Inpatient Hospital Stay (HOSPITAL_COMMUNITY): Payer: 59

## 2018-08-06 ENCOUNTER — Ambulatory Visit
Admission: RE | Admit: 2018-08-06 | Discharge: 2018-08-06 | Disposition: A | Discharge status: Home / Self Care | Payer: 59 | Source: Ambulatory Visit | Attending: Radiation Oncology | Admitting: Radiation Oncology

## 2018-08-06 ENCOUNTER — Other Ambulatory Visit: Payer: Self-pay

## 2018-08-06 ENCOUNTER — Encounter (HOSPITAL_COMMUNITY): Admission: RE | Admit: 2018-08-06 | Payer: 59 | Source: Ambulatory Visit | Admitting: Hematology

## 2018-08-06 VITALS — BP 122/84 | HR 90 | Resp 20 | Wt 161.6 lb

## 2018-08-06 DIAGNOSIS — K5900 Constipation, unspecified: Secondary | ICD-10-CM | POA: Diagnosis not present

## 2018-08-06 DIAGNOSIS — E785 Hyperlipidemia, unspecified: Secondary | ICD-10-CM | POA: Insufficient documentation

## 2018-08-06 DIAGNOSIS — Z85850 Personal history of malignant neoplasm of thyroid: Secondary | ICD-10-CM | POA: Diagnosis not present

## 2018-08-06 DIAGNOSIS — R197 Diarrhea, unspecified: Secondary | ICD-10-CM | POA: Insufficient documentation

## 2018-08-06 DIAGNOSIS — Z87891 Personal history of nicotine dependence: Secondary | ICD-10-CM | POA: Insufficient documentation

## 2018-08-06 DIAGNOSIS — C20 Malignant neoplasm of rectum: Secondary | ICD-10-CM

## 2018-08-06 DIAGNOSIS — Z8042 Family history of malignant neoplasm of prostate: Secondary | ICD-10-CM | POA: Diagnosis not present

## 2018-08-06 DIAGNOSIS — R634 Abnormal weight loss: Secondary | ICD-10-CM | POA: Diagnosis not present

## 2018-08-06 DIAGNOSIS — C787 Secondary malignant neoplasm of liver and intrahepatic bile duct: Secondary | ICD-10-CM | POA: Diagnosis not present

## 2018-08-06 DIAGNOSIS — Z79899 Other long term (current) drug therapy: Secondary | ICD-10-CM | POA: Insufficient documentation

## 2018-08-06 DIAGNOSIS — F419 Anxiety disorder, unspecified: Secondary | ICD-10-CM | POA: Insufficient documentation

## 2018-08-06 DIAGNOSIS — Z8249 Family history of ischemic heart disease and other diseases of the circulatory system: Secondary | ICD-10-CM | POA: Insufficient documentation

## 2018-08-06 DIAGNOSIS — R5383 Other fatigue: Secondary | ICD-10-CM | POA: Diagnosis not present

## 2018-08-06 DIAGNOSIS — Z510 Encounter for antineoplastic radiation therapy: Secondary | ICD-10-CM | POA: Insufficient documentation

## 2018-08-06 DIAGNOSIS — K625 Hemorrhage of anus and rectum: Secondary | ICD-10-CM | POA: Insufficient documentation

## 2018-08-06 DIAGNOSIS — R110 Nausea: Secondary | ICD-10-CM | POA: Diagnosis not present

## 2018-08-06 LAB — COMPREHENSIVE METABOLIC PANEL
ALT: 33 U/L (ref 0–44)
AST: 45 U/L — ABNORMAL HIGH (ref 15–41)
Albumin: 4.2 g/dL (ref 3.5–5.0)
Alkaline Phosphatase: 117 U/L (ref 38–126)
Anion gap: 8 (ref 5–15)
BUN: 15 mg/dL (ref 6–20)
CO2: 24 mmol/L (ref 22–32)
Calcium: 9.2 mg/dL (ref 8.9–10.3)
Chloride: 106 mmol/L (ref 98–111)
Creatinine, Ser: 0.63 mg/dL (ref 0.44–1.00)
GFR calc Af Amer: >60 mL/min (ref >60)
GFR calc non Af Amer: >60 mL/min (ref >60)
Glucose, Bld: 109 mg/dL — ABNORMAL HIGH (ref 70–99)
Potassium: 3.5 mmol/L (ref 3.5–5.1)
Sodium: 138 mmol/L (ref 135–145)
Total Bilirubin: 2.7 mg/dL — ABNORMAL HIGH (ref 0.3–1.2)
Total Protein: 7.2 g/dL (ref 6.5–8.1)

## 2018-08-06 LAB — CBC WITH DIFFERENTIAL/PLATELET
Abs Immature Granulocytes: 0.02 10*3/uL (ref 0.00–0.07)
Basophils Absolute: 0 10*3/uL (ref 0.0–0.1)
Basophils Relative: 0 %
Eosinophils Absolute: 0 10*3/uL (ref 0.0–0.5)
Eosinophils Relative: 1 %
HCT: 34.1 % — ABNORMAL LOW (ref 36.0–46.0)
Hemoglobin: 11.4 g/dL — ABNORMAL LOW (ref 12.0–15.0)
Immature Granulocytes: 1 %
Lymphocytes Relative: 26 %
Lymphs Abs: 0.7 10*3/uL (ref 0.7–4.0)
MCH: 32.5 pg (ref 26.0–34.0)
MCHC: 33.4 g/dL (ref 30.0–36.0)
MCV: 97.2 fL (ref 80.0–100.0)
Monocytes Absolute: 0.2 10*3/uL (ref 0.1–1.0)
Monocytes Relative: 7 %
Neutro Abs: 1.9 10*3/uL (ref 1.7–7.7)
Neutrophils Relative %: 65 %
Platelets: 87 10*3/uL — ABNORMAL LOW (ref 150–400)
RBC: 3.51 MIL/uL — ABNORMAL LOW (ref 3.87–5.11)
RDW: 12.9 % (ref 11.5–15.5)
WBC: 2.9 10*3/uL — ABNORMAL LOW (ref 4.0–10.5)
nRBC: 0 % (ref 0.0–0.2)

## 2018-08-06 LAB — TSH: TSH: 0.114 u[IU]/mL — ABNORMAL LOW (ref 0.350–4.500)

## 2018-08-06 LAB — MAGNESIUM: Magnesium: 2.1 mg/dL (ref 1.7–2.4)

## 2018-08-06 MED FILL — ONDANSETRON HCL 4 MG TABLET: 4 | 10 days supply | Qty: 30 | Fill #0

## 2018-08-06 MED FILL — ALPRAZolam 0.5 MG TABS: 0.5 | 30 days supply | Qty: 60 | Fill #1

## 2018-08-06 NOTE — Patient Instructions (Addendum)
[facility] at [facility] Discharge Instructions  You were seen today by Dr. Katragadda. He went over your recent lab results. He will see you back in 1 week for labs and follow up.   Thank you for choosing [facility] at [facility] to provide your oncology and hematology care.  To afford each patient quality time with our provider, please arrive at least 15 minutes before your scheduled appointment time.   If you have a lab appointment with the Cancer Center please come in thru the  Main Entrance and check in at the main information desk  You need to re-schedule your appointment should you arrive 10 or more minutes late.  We strive to give you quality time with our providers, and arriving late affects you and other patients whose appointments are after yours.  Also, if you no show three or more times for appointments you may be dismissed from the clinic at the providers discretion.     Again, thank you for choosing [facility].  Our hope is that these requests will decrease the amount of time that you wait before being seen by our physicians.       _____________________________________________________________  Should you have questions after your visit to [facility], please contact our office at [phone] between the hours of 8:00 a.m. and 4:30 p.m.  Voicemails left after 4:00 p.m. will not be returned until the following business day.  For prescription refill requests, have your pharmacy contact our office and allow 72 hours.    Cancer Center Support Programs:   > Cancer Support Group  2nd Tuesday of the month 1pm-2pm, [facility]    

## 2018-08-06 NOTE — Assessment & Plan Note (Signed)
1.  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 - Colonoscopy on 02/19/2018 shows rectal mass, 2 to 4 cm from the anal verge, normal sigmoid colon.  Biopsies consistent with adenocarcinoma. - MRI of the pelvis shows rectal adenocarcinoma, T stage-T3, N stage- N1-equivocal N2 nodes.  Maximum extension beyond muscularis propria is 6 mm (advanced T3).  Circumferential resection margin is 7 mm.  Right obturator node 6 mm.  Right external iliac node at 8 mm.  Craniocaudal extent of the tumor is 4.4 cm.  Approximately 180 degree circumferential.  Shortest distance from the distal tumor to anal sphincter is 2.1 cm.  Location from anal verge, 7.2 cm to the middle of the tumor. - CT CAP on 02/21/2018 shows 9 x 7 mm left lower lobe pulmonary nodule, new since 2009.  Possible subpleural left lower lobe 3 mm nodule versus area of pleural thickening.  Vague area of hypoattenuation in the posterior right hepatic lobe measuring 1.6 cm.  Differential includes benign/malignant lesion with altered perfusion.  Focal steatosis is possible. - MRI of the liver on 03/08/2018 shows 1.4 cm lesion in the posterior right hepatic lobe.  There is a 5 mm focus of delayed hyperenhancement in the lateral segment of the left liver, questionable for a second metastatic deposit. -PET CT scan on 03/05/2018 shows hypermetabolic rectal mass.  Left perirectal hypermetabolic and sigmoid mesocolon lymph nodes.  Single hepatic metastatic lesion in the right hepatic lobe.  7 mm pulmonary nodule at the left lung base is suspicious for meta stasis. -Liver biopsy on 03/09/2018 consistent with adenocarcinoma.  - Cycle 1 FOLFOX on 03/14/2018.  She had constipation lasting for 5 days.  She also had cold sensitivity for few days.  She had some  nausea but denied any vomiting. - Cycle 2 of FOLFOX on 03/27/2018. -She was admitted to the hospital from 03/27/2018 through 03/29/2018 with neutropenic fever and sepsis.  She received IV antibiotics. 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  Low starting dose of Irinotecan is indicated. -Because of her elevated total bilirubin to above 5, cycle 3 was given on 04/18/2018 without oxaliplatin.  However vectibix was started. - She did have 1 day of severe weakness after last chemotherapy.  However she did very well without cold sensitivity. -She was evaluated by Dr. Marcille Buffy at transplant hepatology clinic at [facility].  They felt that Irinotecan should be avoided. - Oxaliplatin was dose reduced by 20% during cycle 4 on 05/02/2018. - 7 cycles of FOLFOX with vectibix completed on 06/27/2018.  Vectibix was left out during cycle 7.   - PET scan on 06/21/2018 showed interval resolution of the hypermetabolic area associated with right lobe of the liver metastasis.  Spleen has slightly enlarged to 13.4 cm in length and 12.9 cm previously.  Interval decrease in the radiotracer uptake associated with rectal neoplasm with SUV of 5 compared to 17.5 previously.  Previously noted FDG avid sigmoid mesocolon lymph nodes have resolved. - MRI of the liver on 06/25/2018 shows decrease in size of the liver metastasis to 5 mm, from 15 mm previously.  No new lesions noted. -Patient attended a multidisciplinary consultation with physicians at [facility] as a virtual visit. -She was recommended to have ablative therapy to  the liver lesion.  She underwent ultrasound of her liver which did not show liver lesion.  Hence local treatment for liver lesion was put on hold at this time. - She started radiation therapy with Xeloda on 07/30/2018. -She is taking Xeloda 1500 mg twice daily Monday through Friday.  She did have some loose stools but did not require  Imodium. -She denied any nausea or vomiting.  Some easy bruising was noted on the lower abdomen.  She lost taste sensation. - No signs of hand-foot skin reaction.  We reviewed her blood work.  Platelet count is 87 and white count was low at 2.9 with ANC of 1600. - She will continue Xeloda at the same dose.  I plan to follow her with weekly with labs.  2.  Thyroid cancer: -She had a left thyroidectomy done about 6 years ago.  She is on Synthroid at this time. -TSH, thyroglobulin and antithyroglobulin antibodies were normal.

## 2018-08-06 NOTE — Progress Notes (Signed)
[address], [place] [address]   CLINIC:  Medical Oncology/Hematology  PCP:  [name], MD [address] [place] [address] [phone]   REASON FOR VISIT:  Follow-up for rectal cancer    BRIEF ONCOLOGIC HISTORY:    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[place])   02/26/2018 Initial Diagnosis    Rectal cancer ([place])    03/13/2018 Cancer Staging    Staging form: Colon and Rectum, AJCC 8th Edition - Clinical stage from 03/13/2018: Stage IVA (cT3, cN1b, cM1a) - Signed by Derek Jack, MD on 03/13/2018    03/14/2018 -  Chemotherapy    The patient had palonosetron (ALOXI) injection 0.25 mg, 0.25 mg, Intravenous,  Once, 7 of 8 cycles Administration: 0.25 mg (03/14/2018), 0.25 mg (03/27/2018), 0.25 mg (04/18/2018), 0.25 mg (05/02/2018), 0.25 mg (05/16/2018), 0.25 mg (06/05/2018), 0.25 mg (06/27/2018) leucovorin 800 mg in dextrose 5 % 250 mL infusion, 772 mg, Intravenous,  Once, 7 of 8 cycles Administration: 800 mg (03/14/2018), 800 mg (03/27/2018), 800 mg (05/02/2018), 800 mg (05/16/2018), 700 mg (04/18/2018), 800 mg (06/05/2018), 800 mg (06/27/2018) oxaliplatin (ELOXATIN) 165 mg in dextrose 5 % 500 mL chemo infusion, 85 mg/m2 = 165 mg, Intravenous,  Once, 6 of 7 cycles Dose modification: 68 mg/m2 (80 % of original dose 85 mg/m2, Cycle 4, Reason: Provider Judgment) Administration: 165 mg (03/14/2018), 165 mg (03/27/2018), 130 mg (05/02/2018), 130 mg (05/16/2018), 130 mg (06/05/2018), 130 mg (06/27/2018) panitumumab (VECTIBIX) 500 mg in sodium chloride 0.9 % 100 mL chemo infusion, 480 mg, Intravenous,  Once, 4 of 5 cycles Administration: 500 mg (04/18/2018), 480 mg (05/02/2018), 480 mg (05/16/2018), 480 mg (06/05/2018) fluorouracil (ADRUCIL) chemo injection 750 mg, 400 mg/m2 = 750 mg (100 % of original dose 400 mg/m2), Intravenous,  Once, 6 of 7 cycles Dose modification: 400 mg/m2 (original dose 400 mg/m2, Cycle 1), 400 mg/m2 (original dose 400 mg/m2, Cycle 3)  Administration: 750 mg (03/14/2018), 750 mg (04/18/2018), 750 mg (05/02/2018), 750 mg (05/16/2018), 750 mg (06/05/2018), 750 mg (06/27/2018) fosaprepitant (EMEND) 150 mg, dexamethasone (DECADRON) 12 mg in sodium chloride 0.9 % 145 mL IVPB, , Intravenous,  Once, 4 of 5 cycles Administration:  (05/02/2018),  (05/16/2018),  (06/05/2018),  (06/27/2018) fluorouracil (ADRUCIL) 4,650 mg in sodium chloride 0.9 % 57 mL chemo infusion, 2,400 mg/m2 = 4,650 mg, Intravenous, 1 Day/Dose, 7 of 8 cycles Administration: 4,650 mg (03/14/2018), 4,650 mg (03/27/2018), 4,650 mg (04/18/2018), 4,650 mg (05/02/2018), 4,650 mg (05/16/2018), 4,650 mg (06/05/2018), 4,650 mg (06/27/2018)  for chemotherapy treatment.       CANCER STAGING: Cancer Staging Malignant neoplasm of rectum [facility]) Staging form: Colon and Rectum, AJCC 8th Edition - Clinical stage from 03/13/2018: Stage IVA (cT3, cN1b, cM1a) - Signed by Derek Jack, MD on 03/13/2018    INTERVAL HISTORY:  Ms. Sacco 53 y.o. female returns for routine follow-up. She is here today alone. She states that she continues to experience fatigue and diarrhea at times. She states that she has felt ok since her last visit. She states that she has started the chemo pill and radiation. She states that she has noticed some changes in her appetite as well as a slick feeling on her tongue.  Denies any nausea, vomiting, or diarrhea. Denies any new pains. Had not noticed any recent bleeding such as epistaxis, hematuria or hematochezia. Denies recent chest pain on exertion, shortness of breath on minimal exertion, pre-syncopal episodes, or palpitations. Denies any numbness or tingling in hands or feet. Denies any  recent fevers, infections, or recent hospitalizations. Patient reports appetite at 50% and energy level at 50-75%.  REVIEW OF SYSTEMS:  Review of Systems  Constitutional: Positive for fatigue.  Gastrointestinal: Positive for diarrhea.     PAST MEDICAL/SURGICAL HISTORY:  Past Medical  History:  Diagnosis Date  . Anxiety   . Cancer [facility]) 2013   thyroid  . Endometrial polyp   . Endometrial thickening on ultra sound   . GERD (gastroesophageal reflux disease)   . 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 H/O Hashimoto thyroiditis   . 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 Hyperlipidemia 11/09/2015  . Hypothyroidism   . 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[place]) 02/26/2018   Past Surgical History:  Procedure Laterality Date  . BIOPSY  02/19/2018   Procedure: BIOPSY;  Surgeon: Rogene Houston, MD;  [facility];  Service: Endoscopy;;  rectum  . COLONOSCOPY N/A 08/20/2015   Procedure: COLONOSCOPY;  Surgeon: Rogene Houston, MD;  [facility];  Service: Endoscopy;  Laterality: N/A;  730  . [place] ENDOMETRIAL ABLATION  08-13-2004  . FLEXIBLE SIGMOIDOSCOPY N/A 02/19/2018   Procedure: FLEXIBLE SIGMOIDOSCOPY;  Surgeon: Rogene Houston, MD;  [facility];  Service: Endoscopy;  Laterality: N/A;  . HYSTEROSCOPY W/D&C N/A 04/30/2015   Procedure: DILATATION AND CURETTAGE / INTENDED HYSTEROSCOPY;  Surgeon: Dian Queen, MD;  Location: [place];  Service: Gynecology;  Laterality: N/A;  . IR US GUIDE BX ASP/DRAIN  03/09/2018  . LAPAROSCOPIC CHOLECYSTECTOMY  04/1996  . POLYPECTOMY  08/20/2015   Procedure: POLYPECTOMY;  Surgeon: Rogene Houston, MD;  [facility];  Service: Endoscopy;;  Splenic flexure polypectomy  . POLYPECTOMY  02/19/2018   Procedure: POLYPECTOMY;  Surgeon: Rogene Houston, MD;  [facility];  Service: Endoscopy;;  rectum  . PORTACATH PLACEMENT N/A 03/14/2018   Procedure: INSERTION PORT-A-CATH;  Surgeon: Aviva Signs, MD;  Location: [facility];  Service: General;  Laterality: N/A;  . REDUCTION INCARCERATED UTERUS  06-20-2000   intrauterine preg. 13  wks /  urinary retention  . THYROID LOBECTOMY  11/24/2011   Procedure: THYROID LOBECTOMY;  Surgeon: Earnstine Regal, MD;  Location: [facility];  Service: General;  Laterality: Left;  Left Thyroid Lobectomy  . TUBAL LIGATION  2002     SOCIAL HISTORY:  Social History   Socioeconomic History  . Marital status: Married    Spouse name: Not on file  . Number of children: 4  . Years of education: Not on file  . Highest education level: Not on file  Occupational History    Comment: Marketing executive at [employer]  . Financial resource strain: Not hard at all  . Food insecurity:    Worry: Never true    Inability: Never true  . Transportation needs:    Medical: No    Non-medical: No  Tobacco Use  . Smoking status: Former Smoker    Packs/day: 0.25    Years: 10.00    Pack years: 2.50    Types: Cigarettes    Last attempt to quit: 10/31/1991    Years since quitting: 26.7  . Smokeless tobacco: Never Used  Substance and Sexual Activity  . Alcohol use: No  . Drug use: No  . Sexual activity: Not Currently  Lifestyle  .  Physical activity:    Days per week: 0 days    Minutes per session: 0 min  . Stress: To some extent  Relationships  . Social connections:    Talks on phone: More than three times a week    Gets together: Twice a week    Attends religious service: More than 4 times per year    Active member of club or organization: Yes    Attends meetings of clubs or organizations: More than 4 times per year    Relationship status: Married  . Intimate partner violence:    Fear of current or ex partner: No    Emotionally abused: No    Physically abused: No    Forced sexual activity: No  Other Topics Concern  . Not on file  Social History Narrative  . Not on file    FAMILY HISTORY:  Family History  Problem Relation Age of Onset  . Hypertension Mother   . Hypertension Father   . Prostate cancer Father   . Cancer Maternal Aunt        spine/back  . Cancer Paternal  Grandfather        lung  . Healthy Son   . Healthy Son   . Healthy Son   . Healthy Son   . Colon cancer Neg Hx     CURRENT MEDICATIONS:  Outpatient Encounter Medications as of 08/06/2018  Medication Sig  . ALPRAZolam (XANAX) 0.5 MG tablet Take 1 tablet (0.5 mg total) by mouth 2 (two) times daily as needed for anxiety.  . capecitabine (XELODA) 500 MG tablet Take 3 tablets (1,500 mg total) by mouth 2 (two) times daily after a meal. Take Monday through Friday throughout the course of radiation.  . lidocaine-prilocaine (EMLA) cream Apply small amount to port site one hour prior to appointment and cover with plastic wrap.  . ondansetron (ZOFRAN) 4 MG tablet Take 1 tablet (4 mg total) by mouth every 8 (eight) hours as needed for nausea or vomiting.  . thyroid (ARMOUR) 90 MG tablet Take 90 mg by mouth daily before breakfast.    Facility-Administered Encounter Medications as of 08/06/2018  Medication  . sodium chloride 0.9 % 1,000 mL with potassium chloride 20 mEq, magnesium sulfate 2 g infusion    ALLERGIES:  I have reviewed her drug allergies.  PHYSICAL EXAM:  ECOG Performance status: 0  Vitals:   08/06/18 0918  BP: 122/84  Pulse: 90  Resp: 20  SpO2: 100%   Filed Weights   08/06/18 0918  Weight: 161 lb 9.6 oz (73.3 kg)    Physical Exam Vitals signs reviewed.  Constitutional:      Appearance: Normal appearance.  Cardiovascular:     Rate and Rhythm: Normal rate and regular rhythm.     Heart sounds: Normal heart sounds.  Pulmonary:     Effort: Pulmonary effort is normal.     Breath sounds: Normal breath sounds.  Abdominal:     General: There is no distension.     Palpations: Abdomen is soft. There is no mass.  Musculoskeletal:        General: No swelling.  Skin:    General: Skin is warm.  Neurological:     General: No focal deficit present.     Mental Status: She is alert and oriented to person, place, and time.  Psychiatric:        Mood and Affect: Mood normal.         Behavior: Behavior normal.  LABORATORY DATA:  I have reviewed the labs as listed.  CBC    Component Value Date/Time   WBC 2.9 (L) 08/06/2018 0829   RBC 3.51 (L) 08/06/2018 0829   HGB 11.4 (L) 08/06/2018 0829   HGB 13.5 11/03/2015 0841   HCT 34.1 (L) 08/06/2018 0829   HCT 39.7 11/03/2015 0841   PLT 87 (L) 08/06/2018 0829   PLT 165 11/03/2015 0841   MCV 97.2 08/06/2018 0829   MCV 95 11/03/2015 0841   MCH 32.5 08/06/2018 0829   MCHC 33.4 08/06/2018 0829   RDW 12.9 08/06/2018 0829   RDW 13.2 11/03/2015 0841   LYMPHSABS 0.7 08/06/2018 0829   LYMPHSABS 1.4 11/03/2015 0841   MONOABS 0.2 08/06/2018 0829   EOSABS 0.0 08/06/2018 0829   EOSABS 0.1 11/03/2015 0841   BASOSABS 0.0 08/06/2018 0829   BASOSABS 0.0 11/03/2015 0841   CMP Latest Ref Rng & Units 08/06/2018 06/26/2018 06/04/2018  Glucose 70 - 99 mg/dL 109(H) 93 129(H)  BUN 6 - 20 mg/dL _0 Creatinine 0.44 - 1.00 mg/dL 0.63 0.72 0.63  Sodium 135 - 145 mmol/L 138 141 139  Potassium 3.5 - 5.1 mmol/L 3.5 3.3(L) 3.1(L)  Chloride 98 - 111 mmol/L 106 107 105  CO2 22 - 32 mmol/L _1 Calcium 8.9 - 10.3 mg/dL 9.2 9.0 9.1  Total Protein 6.5 - 8.1 g/dL 7.2 7.2 7.3  Total Bilirubin 0.3 - 1.2 mg/dL 2.7(H) 2.2(H) 1.6(H)  Alkaline Phos 38 - 126 U/L 117 93 82  AST 15 - 41 U/L 45(H) 28 30  ALT 0 - 44 U/L 33 19 23       DIAGNOSTIC IMAGING:  I have independently reviewed the scans and discussed with the patient.   I have reviewed Venita Lick LPN's note and agree with the documentation.  I personally performed a face-to-face visit, made revisions and my assessment and plan is as follows.    ASSESSMENT & PLAN:   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[facility]) 1.  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 -  Colonoscopy on 02/19/2018 shows rectal mass, 2 to 4 cm from the anal verge, normal sigmoid colon.  Biopsies consistent with adenocarcinoma. - MRI of the pelvis shows rectal adenocarcinoma, T stage-T3, N stage- N1-equivocal N2 nodes.  Maximum extension beyond muscularis propria is 6 mm (advanced T3).  Circumferential resection margin is 7 mm.  Right obturator node 6 mm.  Right external iliac node at 8 mm.  Craniocaudal extent of the tumor is 4.4 cm.  Approximately 180 degree circumferential.  Shortest distance from the distal tumor to anal sphincter is 2.1 cm.  Location from anal verge, 7.2 cm to the middle of the tumor. - CT CAP on 02/21/2018 shows 9 x 7 mm left lower lobe pulmonary nodule, new since 2009.  Possible subpleural left lower lobe 3 mm nodule versus area of pleural thickening.  Vague area of hypoattenuation in the posterior right hepatic lobe measuring 1.6 cm.  Differential includes benign/malignant lesion with altered perfusion.  Focal steatosis is possible. - MRI of the liver on 03/08/2018 shows 1.4 cm lesion in the posterior right hepatic lobe.  There is a 5 mm focus of delayed hyperenhancement in the lateral segment of the left liver, questionable for a second metastatic deposit. -PET CT  scan on 03/05/2018 shows hypermetabolic rectal mass.  Left perirectal hypermetabolic and sigmoid mesocolon lymph nodes.  Single hepatic metastatic lesion in the right hepatic lobe.  7 mm pulmonary nodule at the left lung base is suspicious for meta stasis. -Liver biopsy on 03/09/2018 consistent with adenocarcinoma.  - Cycle 1 FOLFOX on 03/14/2018.  She had constipation lasting for 5 days.  She also had cold sensitivity for few days.  She had some nausea but denied any vomiting. - Cycle 2 of FOLFOX on 03/27/2018. -She was admitted to the hospital from 03/27/2018 through 03/29/2018 with neutropenic fever and sepsis.  She received IV antibiotics. 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  Low starting dose of Irinotecan is indicated. -Because of her elevated total bilirubin to above 5, cycle 3 was given on 04/18/2018 without oxaliplatin.  However vectibix was started. - She did have 1 day of severe weakness after last chemotherapy.  However she did very well without cold sensitivity. -She was evaluated by Dr. Marcille Buffy at transplant hepatology clinic at [facility].  They felt that Irinotecan should be avoided. - Oxaliplatin was dose reduced by 20% during cycle 4 on 05/02/2018. - 7 cycles of FOLFOX with vectibix completed on 06/27/2018.  Vectibix was left out during cycle 7.   - PET scan on 06/21/2018 showed interval resolution of the hypermetabolic area associated with right lobe of the liver metastasis.  Spleen has slightly enlarged to 13.4 cm in length and 12.9 cm previously.  Interval decrease in the radiotracer uptake associated with rectal neoplasm with SUV of 5 compared to 17.5 previously.  Previously noted FDG avid sigmoid mesocolon lymph nodes have resolved. - MRI of the liver on 06/25/2018 shows decrease in size of the liver metastasis to 5 mm, from 15 mm previously.  No new lesions noted. -Patient attended a multidisciplinary consultation with physicians at [facility] as a virtual visit. -She was recommended to have ablative therapy to the liver lesion.  She underwent ultrasound of her liver which did not show liver lesion.  Hence local treatment for liver lesion was put on hold at this time. - She started radiation therapy with Xeloda on 07/30/2018. -She is taking Xeloda 1500 mg twice daily Monday through Friday.  She did have some loose stools but did not require Imodium. -She denied any nausea or vomiting.  Some easy bruising was noted on the lower abdomen.  She lost taste sensation. - No signs of hand-foot skin reaction.  We reviewed her blood work.  Platelet count is 87 and white count was low at 2.9 with ANC of 1600. - She will  continue Xeloda at the same dose.  I plan to follow her with weekly with labs.  2.  Thyroid cancer: -She had a left thyroidectomy done about 6 years ago.  She is on Synthroid at this time. -TSH, thyroglobulin and antithyroglobulin antibodies were normal.      Orders placed this encounter:  No orders of the defined types were placed in this encounter.     Derek Jack, MD [place] [phone]

## 2018-08-07 ENCOUNTER — Other Ambulatory Visit: Payer: Self-pay

## 2018-08-07 ENCOUNTER — Ambulatory Visit
Admission: RE | Admit: 2018-08-07 | Discharge: 2018-08-07 | Disposition: A | Discharge status: Home / Self Care | Payer: 59 | Source: Ambulatory Visit | Attending: Radiation Oncology | Admitting: Radiation Oncology

## 2018-08-07 DIAGNOSIS — R634 Abnormal weight loss: Secondary | ICD-10-CM | POA: Diagnosis not present

## 2018-08-07 DIAGNOSIS — C787 Secondary malignant neoplasm of liver and intrahepatic bile duct: Secondary | ICD-10-CM | POA: Diagnosis not present

## 2018-08-07 DIAGNOSIS — E785 Hyperlipidemia, unspecified: Secondary | ICD-10-CM | POA: Diagnosis not present

## 2018-08-07 DIAGNOSIS — R110 Nausea: Secondary | ICD-10-CM | POA: Diagnosis not present

## 2018-08-07 DIAGNOSIS — K625 Hemorrhage of anus and rectum: Secondary | ICD-10-CM | POA: Diagnosis not present

## 2018-08-07 DIAGNOSIS — K5900 Constipation, unspecified: Secondary | ICD-10-CM | POA: Diagnosis not present

## 2018-08-07 DIAGNOSIS — R197 Diarrhea, unspecified: Secondary | ICD-10-CM | POA: Diagnosis not present

## 2018-08-07 DIAGNOSIS — R5383 Other fatigue: Secondary | ICD-10-CM | POA: Diagnosis not present

## 2018-08-07 DIAGNOSIS — C20 Malignant neoplasm of rectum: Secondary | ICD-10-CM | POA: Diagnosis not present

## 2018-08-07 LAB — CEA: CEA: 0.9 ng/mL (ref 0.0–4.7)

## 2018-08-08 ENCOUNTER — Other Ambulatory Visit: Payer: Self-pay

## 2018-08-08 ENCOUNTER — Ambulatory Visit
Admission: RE | Admit: 2018-08-08 | Discharge: 2018-08-08 | Disposition: A | Discharge status: Home / Self Care | Payer: 59 | Source: Ambulatory Visit | Attending: Radiation Oncology | Admitting: Radiation Oncology

## 2018-08-08 DIAGNOSIS — C787 Secondary malignant neoplasm of liver and intrahepatic bile duct: Secondary | ICD-10-CM | POA: Diagnosis not present

## 2018-08-08 DIAGNOSIS — R634 Abnormal weight loss: Secondary | ICD-10-CM | POA: Diagnosis not present

## 2018-08-08 DIAGNOSIS — K5900 Constipation, unspecified: Secondary | ICD-10-CM | POA: Diagnosis not present

## 2018-08-08 DIAGNOSIS — E785 Hyperlipidemia, unspecified: Secondary | ICD-10-CM | POA: Diagnosis not present

## 2018-08-08 DIAGNOSIS — C20 Malignant neoplasm of rectum: Secondary | ICD-10-CM | POA: Diagnosis not present

## 2018-08-08 DIAGNOSIS — R5383 Other fatigue: Secondary | ICD-10-CM | POA: Diagnosis not present

## 2018-08-08 DIAGNOSIS — R197 Diarrhea, unspecified: Secondary | ICD-10-CM | POA: Diagnosis not present

## 2018-08-08 DIAGNOSIS — K625 Hemorrhage of anus and rectum: Secondary | ICD-10-CM | POA: Diagnosis not present

## 2018-08-08 DIAGNOSIS — R110 Nausea: Secondary | ICD-10-CM | POA: Diagnosis not present

## 2018-08-09 ENCOUNTER — Other Ambulatory Visit: Payer: Self-pay

## 2018-08-09 ENCOUNTER — Ambulatory Visit
Admission: RE | Admit: 2018-08-09 | Discharge: 2018-08-09 | Disposition: A | Discharge status: Home / Self Care | Payer: 59 | Source: Ambulatory Visit | Attending: Radiation Oncology | Admitting: Radiation Oncology

## 2018-08-09 DIAGNOSIS — K5900 Constipation, unspecified: Secondary | ICD-10-CM | POA: Diagnosis not present

## 2018-08-09 DIAGNOSIS — E785 Hyperlipidemia, unspecified: Secondary | ICD-10-CM | POA: Diagnosis not present

## 2018-08-09 DIAGNOSIS — K625 Hemorrhage of anus and rectum: Secondary | ICD-10-CM | POA: Diagnosis not present

## 2018-08-09 DIAGNOSIS — C787 Secondary malignant neoplasm of liver and intrahepatic bile duct: Secondary | ICD-10-CM | POA: Diagnosis not present

## 2018-08-09 DIAGNOSIS — R197 Diarrhea, unspecified: Secondary | ICD-10-CM | POA: Diagnosis not present

## 2018-08-09 DIAGNOSIS — R110 Nausea: Secondary | ICD-10-CM | POA: Diagnosis not present

## 2018-08-09 DIAGNOSIS — R634 Abnormal weight loss: Secondary | ICD-10-CM | POA: Diagnosis not present

## 2018-08-09 DIAGNOSIS — R5383 Other fatigue: Secondary | ICD-10-CM | POA: Diagnosis not present

## 2018-08-09 DIAGNOSIS — C20 Malignant neoplasm of rectum: Secondary | ICD-10-CM | POA: Diagnosis not present

## 2018-08-10 ENCOUNTER — Ambulatory Visit
Admission: RE | Admit: 2018-08-10 | Discharge: 2018-08-10 | Disposition: A | Discharge status: Home / Self Care | Payer: 59 | Source: Ambulatory Visit | Attending: Radiation Oncology | Admitting: Radiation Oncology

## 2018-08-10 ENCOUNTER — Other Ambulatory Visit: Payer: Self-pay

## 2018-08-10 DIAGNOSIS — K625 Hemorrhage of anus and rectum: Secondary | ICD-10-CM | POA: Diagnosis not present

## 2018-08-10 DIAGNOSIS — C787 Secondary malignant neoplasm of liver and intrahepatic bile duct: Secondary | ICD-10-CM | POA: Diagnosis not present

## 2018-08-10 DIAGNOSIS — R110 Nausea: Secondary | ICD-10-CM | POA: Diagnosis not present

## 2018-08-10 DIAGNOSIS — R634 Abnormal weight loss: Secondary | ICD-10-CM | POA: Diagnosis not present

## 2018-08-10 DIAGNOSIS — R5383 Other fatigue: Secondary | ICD-10-CM | POA: Diagnosis not present

## 2018-08-10 DIAGNOSIS — R197 Diarrhea, unspecified: Secondary | ICD-10-CM | POA: Diagnosis not present

## 2018-08-10 DIAGNOSIS — E785 Hyperlipidemia, unspecified: Secondary | ICD-10-CM | POA: Diagnosis not present

## 2018-08-10 DIAGNOSIS — C20 Malignant neoplasm of rectum: Secondary | ICD-10-CM | POA: Diagnosis not present

## 2018-08-10 DIAGNOSIS — K5900 Constipation, unspecified: Secondary | ICD-10-CM | POA: Diagnosis not present

## 2018-08-10 NOTE — Progress Notes (Signed)
Pt here for patient teaching.  Pt given Radiation and You booklet.  Reviewed areas of pertinence such as diarrhea, fatigue, hair loss, sexual and fertility changes, skin changes and urinary and bladder changes . Pt able to give teach back of to pat skin, use unscented/gentle soap, use baby wipes, have Imodium on hand, drink plenty of water and sitz bath,avoid applying anything to skin within 4 hours of treatment. Pt verbalized understanding of information given and will contact nursing with any questions or concerns.    Gloriajean Dell. Leonie Green, BSN

## 2018-08-13 ENCOUNTER — Inpatient Hospital Stay (HOSPITAL_COMMUNITY): Payer: 59

## 2018-08-13 ENCOUNTER — Inpatient Hospital Stay (HOSPITAL_BASED_OUTPATIENT_CLINIC_OR_DEPARTMENT_OTHER): Payer: 59 | Admitting: Hematology

## 2018-08-13 ENCOUNTER — Other Ambulatory Visit: Payer: Self-pay

## 2018-08-13 ENCOUNTER — Ambulatory Visit
Admission: RE | Admit: 2018-08-13 | Discharge: 2018-08-13 | Disposition: A | Discharge status: Home / Self Care | Payer: 59 | Source: Ambulatory Visit | Attending: Radiation Oncology | Admitting: Radiation Oncology

## 2018-08-13 ENCOUNTER — Encounter (HOSPITAL_COMMUNITY): Payer: Self-pay | Admitting: Hematology

## 2018-08-13 ENCOUNTER — Ambulatory Visit (HOSPITAL_COMMUNITY): Payer: 59 | Admitting: Hematology

## 2018-08-13 ENCOUNTER — Other Ambulatory Visit (HOSPITAL_COMMUNITY): Payer: 59

## 2018-08-13 DIAGNOSIS — Z87891 Personal history of nicotine dependence: Secondary | ICD-10-CM

## 2018-08-13 DIAGNOSIS — K625 Hemorrhage of anus and rectum: Secondary | ICD-10-CM

## 2018-08-13 DIAGNOSIS — R5383 Other fatigue: Secondary | ICD-10-CM

## 2018-08-13 DIAGNOSIS — Z79899 Other long term (current) drug therapy: Secondary | ICD-10-CM

## 2018-08-13 DIAGNOSIS — R197 Diarrhea, unspecified: Secondary | ICD-10-CM | POA: Diagnosis not present

## 2018-08-13 DIAGNOSIS — R110 Nausea: Secondary | ICD-10-CM | POA: Diagnosis not present

## 2018-08-13 DIAGNOSIS — C20 Malignant neoplasm of rectum: Secondary | ICD-10-CM

## 2018-08-13 DIAGNOSIS — C787 Secondary malignant neoplasm of liver and intrahepatic bile duct: Secondary | ICD-10-CM | POA: Diagnosis not present

## 2018-08-13 DIAGNOSIS — Z8249 Family history of ischemic heart disease and other diseases of the circulatory system: Secondary | ICD-10-CM

## 2018-08-13 DIAGNOSIS — Z85850 Personal history of malignant neoplasm of thyroid: Secondary | ICD-10-CM

## 2018-08-13 DIAGNOSIS — K5900 Constipation, unspecified: Secondary | ICD-10-CM | POA: Diagnosis not present

## 2018-08-13 DIAGNOSIS — R634 Abnormal weight loss: Secondary | ICD-10-CM

## 2018-08-13 DIAGNOSIS — Z8042 Family history of malignant neoplasm of prostate: Secondary | ICD-10-CM

## 2018-08-13 DIAGNOSIS — E785 Hyperlipidemia, unspecified: Secondary | ICD-10-CM | POA: Diagnosis not present

## 2018-08-13 LAB — COMPREHENSIVE METABOLIC PANEL
ALT: 24 U/L (ref 0–44)
AST: 35 U/L (ref 15–41)
Albumin: 4.3 g/dL (ref 3.5–5.0)
Alkaline Phosphatase: 118 U/L (ref 38–126)
Anion gap: 12 (ref 5–15)
BUN: 11 mg/dL (ref 6–20)
CO2: 23 mmol/L (ref 22–32)
Calcium: 9.1 mg/dL (ref 8.9–10.3)
Chloride: 105 mmol/L (ref 98–111)
Creatinine, Ser: 0.68 mg/dL (ref 0.44–1.00)
GFR calc Af Amer: >60 mL/min (ref >60)
GFR calc non Af Amer: >60 mL/min (ref >60)
Glucose, Bld: 86 mg/dL (ref 70–99)
Potassium: 3.3 mmol/L — ABNORMAL LOW (ref 3.5–5.1)
Sodium: 140 mmol/L (ref 135–145)
Total Bilirubin: 3.5 mg/dL — ABNORMAL HIGH (ref 0.3–1.2)
Total Protein: 7.2 g/dL (ref 6.5–8.1)

## 2018-08-13 LAB — CBC WITH DIFFERENTIAL/PLATELET
Abs Immature Granulocytes: 0.03 10*3/uL (ref 0.00–0.07)
Basophils Absolute: 0 10*3/uL (ref 0.0–0.1)
Basophils Relative: 1 %
Eosinophils Absolute: 0.1 10*3/uL (ref 0.0–0.5)
Eosinophils Relative: 2 %
HCT: 32.3 % — ABNORMAL LOW (ref 36.0–46.0)
Hemoglobin: 11.3 g/dL — ABNORMAL LOW (ref 12.0–15.0)
Immature Granulocytes: 1 %
Lymphocytes Relative: 19 %
Lymphs Abs: 0.8 10*3/uL (ref 0.7–4.0)
MCH: 33.9 pg (ref 26.0–34.0)
MCHC: 35 g/dL (ref 30.0–36.0)
MCV: 97 fL (ref 80.0–100.0)
Monocytes Absolute: 0.3 10*3/uL (ref 0.1–1.0)
Monocytes Relative: 7 %
Neutro Abs: 2.8 10*3/uL (ref 1.7–7.7)
Neutrophils Relative %: 70 %
Platelets: 89 10*3/uL — ABNORMAL LOW (ref 150–400)
RBC: 3.33 MIL/uL — ABNORMAL LOW (ref 3.87–5.11)
RDW: 14.9 % (ref 11.5–15.5)
WBC: 3.9 10*3/uL — ABNORMAL LOW (ref 4.0–10.5)
nRBC: 0 % (ref 0.0–0.2)

## 2018-08-13 LAB — MAGNESIUM: Magnesium: 2.1 mg/dL (ref 1.7–2.4)

## 2018-08-13 NOTE — Progress Notes (Signed)
Samantha Reynolds, Samantha Reynolds   CLINIC:  Medical Oncology/Hematology  PCP:  Samantha Drown, MD [address] [place] [address] [phone]   REASON FOR VISIT:  Follow-up for rectal cancer    BRIEF ONCOLOGIC HISTORY:    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[place])   02/26/2018 Initial Diagnosis    Rectal cancer ([place])    03/13/2018 Cancer Staging    Staging form: Colon and Rectum, AJCC 8th Edition - Clinical stage from 03/13/2018: Stage IVA (cT3, cN1b, cM1a) - Signed by Derek Jack, MD on 03/13/2018    03/14/2018 -  Chemotherapy    The patient had palonosetron (ALOXI) injection 0.25 mg, 0.25 mg, Intravenous,  Once, 7 of 8 cycles Administration: 0.25 mg (03/14/2018), 0.25 mg (03/27/2018), 0.25 mg (04/18/2018), 0.25 mg (05/02/2018), 0.25 mg (05/16/2018), 0.25 mg (06/05/2018), 0.25 mg (06/27/2018) leucovorin 800 mg in dextrose 5 % 250 mL infusion, 772 mg, Intravenous,  Once, 7 of 8 cycles Administration: 800 mg (03/14/2018), 800 mg (03/27/2018), 800 mg (05/02/2018), 800 mg (05/16/2018), 700 mg (04/18/2018), 800 mg (06/05/2018), 800 mg (06/27/2018) oxaliplatin (ELOXATIN) 165 mg in dextrose 5 % 500 mL chemo infusion, 85 mg/m2 = 165 mg, Intravenous,  Once, 6 of 7 cycles Dose modification: 68 mg/m2 (80 % of original dose 85 mg/m2, Cycle 4, Reason: Provider Judgment) Administration: 165 mg (03/14/2018), 165 mg (03/27/2018), 130 mg (05/02/2018), 130 mg (05/16/2018), 130 mg (06/05/2018), 130 mg (06/27/2018) panitumumab (VECTIBIX) 500 mg in sodium chloride 0.9 % 100 mL chemo infusion, 480 mg, Intravenous,  Once, 4 of 5 cycles Administration: 500 mg (04/18/2018), 480 mg (05/02/2018), 480 mg (05/16/2018), 480 mg (06/05/2018) fluorouracil (ADRUCIL) chemo injection 750 mg, 400 mg/m2 = 750 mg (100 % of original dose 400 mg/m2), Intravenous,  Once, 6 of 7 cycles Dose modification: 400 mg/m2 (original dose 400 mg/m2, Cycle 1), 400 mg/m2 (original dose 400 mg/m2, Cycle 3)  Administration: 750 mg (03/14/2018), 750 mg (04/18/2018), 750 mg (05/02/2018), 750 mg (05/16/2018), 750 mg (06/05/2018), 750 mg (06/27/2018) fosaprepitant (EMEND) 150 mg, dexamethasone (DECADRON) 12 mg in sodium chloride 0.9 % 145 mL IVPB, , Intravenous,  Once, 4 of 5 cycles Administration:  (05/02/2018),  (05/16/2018),  (06/05/2018),  (06/27/2018) fluorouracil (ADRUCIL) 4,650 mg in sodium chloride 0.9 % 57 mL chemo infusion, 2,400 mg/m2 = 4,650 mg, Intravenous, 1 Day/Dose, 7 of 8 cycles Administration: 4,650 mg (03/14/2018), 4,650 mg (03/27/2018), 4,650 mg (04/18/2018), 4,650 mg (05/02/2018), 4,650 mg (05/16/2018), 4,650 mg (06/05/2018), 4,650 mg (06/27/2018)  for chemotherapy treatment.       CANCER STAGING: Cancer Staging Malignant neoplasm of rectum [facility]) Staging form: Colon and Rectum, AJCC 8th Edition - Clinical stage from 03/13/2018: Stage IVA (cT3, cN1b, cM1a) - Signed by Derek Jack, MD on 03/13/2018    INTERVAL HISTORY:  Samantha Reynolds 53 y.o. female returns for follow-up.  She is on chemoradiation therapy.  She is taking Xeloda 3 tablets twice daily.  She does report some nausea associated with it.  She is requiring some Zofran.  She continues to have loose stools.  On Saturday she had 2 episodes of bleeding per rectum with small quantity.  She has history of hemorrhoids and had on and off bleeding in the past.  Appetite is 50% and energy levels are 75%.  Denies any fevers or infections.  No vomiting reported.  REVIEW OF SYSTEMS:  Review of Systems  Constitutional: Negative for fatigue.  Gastrointestinal: Positive for blood in stool, diarrhea and nausea.  All  other systems reviewed and are negative.    PAST MEDICAL/SURGICAL HISTORY:  Past Medical History:  Diagnosis Date  . Anxiety   . Cancer [facility]) 2013   thyroid  . Endometrial polyp   . Endometrial thickening on ultra sound   . GERD (gastroesophageal reflux disease)   . 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 H/O Hashimoto thyroiditis   . 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 Hyperlipidemia 11/09/2015  . Hypothyroidism   . 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[place]) 02/26/2018   Past Surgical History:  Procedure Laterality Date  . BIOPSY  02/19/2018   Procedure: BIOPSY;  Surgeon: Rogene Houston, MD;  [facility];  Service: Endoscopy;;  rectum  . COLONOSCOPY N/A 08/20/2015   Procedure: COLONOSCOPY;  Surgeon: Rogene Houston, MD;  [facility];  Service: Endoscopy;  Laterality: N/A;  730  . [place] ENDOMETRIAL ABLATION  08-13-2004  . FLEXIBLE SIGMOIDOSCOPY N/A 02/19/2018   Procedure: FLEXIBLE SIGMOIDOSCOPY;  Surgeon: Rogene Houston, MD;  [facility];  Service: Endoscopy;  Laterality: N/A;  . HYSTEROSCOPY W/D&C N/A 04/30/2015   Procedure: DILATATION AND CURETTAGE / INTENDED HYSTEROSCOPY;  Surgeon: Dian Queen, MD;  Location: [place];  Service: Gynecology;  Laterality: N/A;  . IR US GUIDE BX ASP/DRAIN  03/09/2018  . LAPAROSCOPIC CHOLECYSTECTOMY  04/1996  . POLYPECTOMY  08/20/2015   Procedure: POLYPECTOMY;  Surgeon: Rogene Houston, MD;  [facility];  Service: Endoscopy;;  Splenic flexure polypectomy  . POLYPECTOMY  02/19/2018   Procedure: POLYPECTOMY;  Surgeon: Rogene Houston, MD;  [facility];  Service: Endoscopy;;  rectum  . PORTACATH PLACEMENT N/A 03/14/2018   Procedure: INSERTION PORT-A-CATH;  Surgeon: Aviva Signs, MD;  Location: [facility];  Service: General;  Laterality: N/A;  . REDUCTION INCARCERATED UTERUS  06-20-2000   intrauterine preg. 13 wks /  urinary retention  . THYROID LOBECTOMY  11/24/2011   Procedure: THYROID LOBECTOMY;  Surgeon: Earnstine Regal, MD;  Location: [facility];  Service: General;  Laterality: Left;  Left Thyroid Lobectomy  . TUBAL LIGATION  2002     SOCIAL  HISTORY:  Social History   Socioeconomic History  . Marital status: Married    Spouse name: Not on file  . Number of children: 4  . Years of education: Not on file  . Highest education level: Not on file  Occupational History    Comment: Marketing executive at [employer]  . Financial resource strain: Not hard at all  . Food insecurity:    Worry: Never true    Inability: Never true  . Transportation needs:    Medical: No    Non-medical: No  Tobacco Use  . Smoking status: Former Smoker    Packs/day: 0.25    Years: 10.00    Pack years: 2.50    Types: Cigarettes    Last attempt to quit: 10/31/1991    Years since quitting: 26.8  . Smokeless tobacco: Never Used  Substance and Sexual Activity  . Alcohol use: No  . Drug use: No  . Sexual activity: Not Currently  Lifestyle  . Physical activity:    Days per week: 0 days    Minutes per session: 0 min  . Stress: To some extent  Relationships  .  Social connections:    Talks on phone: More than three times a week    Gets together: Twice a week    Attends religious service: More than 4 times per year    Active member of club or organization: Yes    Attends meetings of clubs or organizations: More than 4 times per year    Relationship status: Married  . Intimate partner violence:    Fear of current or ex partner: No    Emotionally abused: No    Physically abused: No    Forced sexual activity: No  Other Topics Concern  . Not on file  Social History Narrative  . Not on file    FAMILY HISTORY:  Family History  Problem Relation Age of Onset  . Hypertension Mother   . Hypertension Father   . Prostate cancer Father   . Cancer Maternal Aunt        spine/back  . Cancer Paternal Grandfather        lung  . Healthy Son   . Healthy Son   . Healthy Son   . Healthy Son   . Colon cancer Neg Hx     CURRENT MEDICATIONS:  Outpatient Encounter Medications as of 08/13/2018  Medication Sig  . ALPRAZolam (XANAX) 0.5 MG tablet  Take 1 tablet (0.5 mg total) by mouth 2 (two) times daily as needed for anxiety.  . capecitabine (XELODA) 500 MG tablet Take 3 tablets (1,500 mg total) by mouth 2 (two) times daily after a meal. Take Monday through Friday throughout the course of radiation.  . lidocaine-prilocaine (EMLA) cream Apply small amount to port site one hour prior to appointment and cover with plastic wrap.  . loperamide (IMODIUM) 2 MG capsule Take 2 mg by mouth as needed for diarrhea or loose stools.  . ondansetron (ZOFRAN) 4 MG tablet Take 1 tablet (4 mg total) by mouth every 8 (eight) hours as needed for nausea or vomiting.  . thyroid (ARMOUR) 90 MG tablet Take 90 mg by mouth daily before breakfast.    Facility-Administered Encounter Medications as of 08/13/2018  Medication  . sodium chloride 0.9 % 1,000 mL with potassium chloride 20 mEq, magnesium sulfate 2 g infusion    ALLERGIES:  I have reviewed her drug allergies.  PHYSICAL EXAM:  ECOG Performance status: 0  Vitals:   08/13/18 1100  BP: 113/70  Pulse: 91  Resp: 16  Temp: 98.1 F (36.7 C)  SpO2: 100%   Filed Weights   08/13/18 1100  Weight: 163 lb 1 oz (74 kg)    Physical Exam Vitals signs reviewed.  Constitutional:      Appearance: Normal appearance.  Cardiovascular:     Rate and Rhythm: Normal rate and regular rhythm.     Heart sounds: Normal heart sounds.  Pulmonary:     Effort: Pulmonary effort is normal.     Breath sounds: Normal breath sounds.  Abdominal:     General: There is no distension.     Palpations: Abdomen is soft. There is no mass.  Musculoskeletal:        General: No swelling.  Skin:    General: Skin is warm.  Neurological:     General: No focal deficit present.     Mental Status: She is alert and oriented to person, place, and time.  Psychiatric:        Mood and Affect: Mood normal.        Behavior: Behavior normal.      LABORATORY  DATA:  I have reviewed the labs as listed.  CBC    Component Value  Date/Time   WBC 3.9 (L) 08/13/2018 1000   RBC 3.33 (L) 08/13/2018 1000   HGB 11.3 (L) 08/13/2018 1000   HGB 13.5 11/03/2015 0841   HCT 32.3 (L) 08/13/2018 1000   HCT 39.7 11/03/2015 0841   PLT 89 (L) 08/13/2018 1000   PLT 165 11/03/2015 0841   MCV 97.0 08/13/2018 1000   MCV 95 11/03/2015 0841   MCH 33.9 08/13/2018 1000   MCHC 35.0 08/13/2018 1000   RDW 14.9 08/13/2018 1000   RDW 13.2 11/03/2015 0841   LYMPHSABS 0.8 08/13/2018 1000   LYMPHSABS 1.4 11/03/2015 0841   MONOABS 0.3 08/13/2018 1000   EOSABS 0.1 08/13/2018 1000   EOSABS 0.1 11/03/2015 0841   BASOSABS 0.0 08/13/2018 1000   BASOSABS 0.0 11/03/2015 0841   CMP Latest Ref Rng & Units 08/13/2018 08/06/2018 06/26/2018  Glucose 70 - 99 mg/dL 86 109(H) 93  BUN 6 - 20 mg/dL _0 Creatinine 0.44 - 1.00 mg/dL 0.68 0.63 0.72  Sodium 135 - 145 mmol/L 140 138 141  Potassium 3.5 - 5.1 mmol/L 3.3(L) 3.5 3.3(L)  Chloride 98 - 111 mmol/L 105 106 107  CO2 22 - 32 mmol/L _1 Calcium 8.9 - 10.3 mg/dL 9.1 9.2 9.0  Total Protein 6.5 - 8.1 g/dL 7.2 7.2 7.2  Total Bilirubin 0.3 - 1.2 mg/dL 3.5(H) 2.7(H) 2.2(H)  Alkaline Phos 38 - 126 U/L 118 117 93  AST 15 - 41 U/L 35 45(H) 28  ALT 0 - 44 U/L 24 33 19       DIAGNOSTIC IMAGING:  I have independently reviewed the scans and discussed with the patient.    ASSESSMENT & PLAN:   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[facility]) 1.  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 - Colonoscopy on 02/19/2018 shows rectal mass, 2 to 4 cm from the anal verge, normal sigmoid colon.  Biopsies consistent with adenocarcinoma. - MRI of the pelvis shows rectal adenocarcinoma, T stage-T3, N stage- N1-equivocal N2 nodes.  Maximum extension beyond muscularis propria is 6 mm (advanced T3).   Circumferential resection margin is 7 mm.  Right obturator node 6 mm.  Right external iliac node at 8 mm.  Craniocaudal extent of the tumor is 4.4 cm.  Approximately 180 degree circumferential.  Shortest distance from the distal tumor to anal sphincter is 2.1 cm.  Location from anal verge, 7.2 cm to the middle of the tumor. - CT CAP on 02/21/2018 shows 9 x 7 mm left lower lobe pulmonary nodule, new since 2009.  Possible subpleural left lower lobe 3 mm nodule versus area of pleural thickening.  Vague area of hypoattenuation in the posterior right hepatic lobe measuring 1.6 cm.  Differential includes benign/malignant lesion with altered perfusion.  Focal steatosis is possible. - MRI of the liver on 03/08/2018 shows 1.4 cm lesion in the posterior right hepatic lobe.  There is a 5 mm focus of delayed hyperenhancement in the lateral segment of the left liver, questionable for a second metastatic deposit. -PET CT scan on 03/05/2018 shows hypermetabolic rectal mass.  Left perirectal hypermetabolic and sigmoid mesocolon lymph nodes.  Single hepatic metastatic lesion in the right hepatic lobe.  7 mm pulmonary nodule  at the left lung base is suspicious for meta stasis. -Liver biopsy on 03/09/2018 consistent with adenocarcinoma.  - Cycle 1 FOLFOX on 03/14/2018.  She had constipation lasting for 5 days.  She also had cold sensitivity for few days.  She had some nausea but denied any vomiting. - Cycle 2 of FOLFOX on 03/27/2018. -She was admitted to the hospital from 03/27/2018 through 03/29/2018 with neutropenic fever and sepsis.  She received IV antibiotics. 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  Low starting dose of Irinotecan is indicated. -Because of her elevated total bilirubin to above 5, cycle 3 was given on 04/18/2018 without oxaliplatin.  However vectibix was started. - She did have 1 day of severe weakness after last chemotherapy.   However she did very well without cold sensitivity. -She was evaluated by Dr. Marcille Buffy at transplant hepatology clinic at [facility].  They felt that Irinotecan should be avoided. - Oxaliplatin was dose reduced by 20% during cycle 4 on 05/02/2018. - 7 cycles of FOLFOX with vectibix completed on 06/27/2018.  Vectibix was left out during cycle 7.   - PET scan on 06/21/2018 showed interval resolution of the hypermetabolic area associated with right lobe of the liver metastasis.  Spleen has slightly enlarged to 13.4 cm in length and 12.9 cm previously.  Interval decrease in the radiotracer uptake associated with rectal neoplasm with SUV of 5 compared to 17.5 previously.  Previously noted FDG avid sigmoid mesocolon lymph nodes have resolved. - MRI of the liver on 06/25/2018 shows decrease in size of the liver metastasis to 5 mm, from 15 mm previously.  No new lesions noted. -Patient attended a multidisciplinary consultation with physicians at [facility] as a virtual visit. -She was recommended to have ablative therapy to the liver lesion.  She underwent ultrasound of her liver which did not show liver lesion.  Hence local treatment for liver lesion was put on hold at this time. - She started radiation therapy with Xeloda on 07/30/2018. -She is taking Xeloda 1500 mg twice daily Monday through Friday. -She did not have any clinical signs or symptoms of hand-foot skin reaction.  However she reported bleeding per rectum on Saturday, 2 episodes.  She did have history of hemorrhoids. - I have reviewed her blood counts.  Total bilirubin has gone up to 3.4.  We will closely monitor it. -She has mild nausea with Xeloda.  I have advised her to take Zofran 30 minutes prior to taking morning dose of Xeloda.  2.  Thyroid cancer: -She had a left thyroidectomy done about 6 years ago.  She is on Synthroid at this time. -TSH, thyroglobulin and antithyroglobulin antibodies were normal.      Orders placed this encounter:  No  orders of the defined types were placed in this encounter.     Derek Jack, MD [place] [phone]

## 2018-08-13 NOTE — Assessment & Plan Note (Signed)
1.  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 - Colonoscopy on 02/19/2018 shows rectal mass, 2 to 4 cm from the anal verge, normal sigmoid colon.  Biopsies consistent with adenocarcinoma. - MRI of the pelvis shows rectal adenocarcinoma, T stage-T3, N stage- N1-equivocal N2 nodes.  Maximum extension beyond muscularis propria is 6 mm (advanced T3).  Circumferential resection margin is 7 mm.  Right obturator node 6 mm.  Right external iliac node at 8 mm.  Craniocaudal extent of the tumor is 4.4 cm.  Approximately 180 degree circumferential.  Shortest distance from the distal tumor to anal sphincter is 2.1 cm.  Location from anal verge, 7.2 cm to the middle of the tumor. - CT CAP on 02/21/2018 shows 9 x 7 mm left lower lobe pulmonary nodule, new since 2009.  Possible subpleural left lower lobe 3 mm nodule versus area of pleural thickening.  Vague area of hypoattenuation in the posterior right hepatic lobe measuring 1.6 cm.  Differential includes benign/malignant lesion with altered perfusion.  Focal steatosis is possible. - MRI of the liver on 03/08/2018 shows 1.4 cm lesion in the posterior right hepatic lobe.  There is a 5 mm focus of delayed hyperenhancement in the lateral segment of the left liver, questionable for a second metastatic deposit. -PET CT scan on 03/05/2018 shows hypermetabolic rectal mass.  Left perirectal hypermetabolic and sigmoid mesocolon lymph nodes.  Single hepatic metastatic lesion in the right hepatic lobe.  7 mm pulmonary nodule at the left lung base is suspicious for meta stasis. -Liver biopsy on 03/09/2018 consistent with adenocarcinoma.  - Cycle 1 FOLFOX on 03/14/2018.  She had constipation lasting for 5 days.  She also had cold sensitivity for few days.  She had some  nausea but denied any vomiting. - Cycle 2 of FOLFOX on 03/27/2018. -She was admitted to the hospital from 03/27/2018 through 03/29/2018 with neutropenic fever and sepsis.  She received IV antibiotics. 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  Low starting dose of Irinotecan is indicated. -Because of her elevated total bilirubin to above 5, cycle 3 was given on 04/18/2018 without oxaliplatin.  However vectibix was started. - She did have 1 day of severe weakness after last chemotherapy.  However she did very well without cold sensitivity. -She was evaluated by Dr. Marcille Buffy at transplant hepatology clinic at [facility].  They felt that Irinotecan should be avoided. - Oxaliplatin was dose reduced by 20% during cycle 4 on 05/02/2018. - 7 cycles of FOLFOX with vectibix completed on 06/27/2018.  Vectibix was left out during cycle 7.   - PET scan on 06/21/2018 showed interval resolution of the hypermetabolic area associated with right lobe of the liver metastasis.  Spleen has slightly enlarged to 13.4 cm in length and 12.9 cm previously.  Interval decrease in the radiotracer uptake associated with rectal neoplasm with SUV of 5 compared to 17.5 previously.  Previously noted FDG avid sigmoid mesocolon lymph nodes have resolved. - MRI of the liver on 06/25/2018 shows decrease in size of the liver metastasis to 5 mm, from 15 mm previously.  No new lesions noted. -Patient attended a multidisciplinary consultation with physicians at [facility] as a virtual visit. -She was recommended to have ablative therapy to  the liver lesion.  She underwent ultrasound of her liver which did not show liver lesion.  Hence local treatment for liver lesion was put on hold at this time. - She started radiation therapy with Xeloda on 07/30/2018. -She is taking Xeloda 1500 mg twice daily Monday through Friday. -She did not have any clinical signs or symptoms of hand-foot  skin reaction.  However she reported bleeding per rectum on Saturday, 2 episodes.  She did have history of hemorrhoids. - I have reviewed her blood counts.  Total bilirubin has gone up to 3.4.  We will closely monitor it. -She has mild nausea with Xeloda.  I have advised her to take Zofran 30 minutes prior to taking morning dose of Xeloda.  2.  Thyroid cancer: -She had a left thyroidectomy done about 6 years ago.  She is on Synthroid at this time. -TSH, thyroglobulin and antithyroglobulin antibodies were normal.

## 2018-08-13 NOTE — Patient Instructions (Signed)
[facility] at [facility] Discharge Instructions  You were seen today by Dr. Katragadda. He went over your recent lab results. He will see you back in 1 week for labs and follow up.   Thank you for choosing [facility] at [facility] to provide your oncology and hematology care.  To afford each patient quality time with our provider, please arrive at least 15 minutes before your scheduled appointment time.   If you have a lab appointment with the Cancer Center please come in thru the  Main Entrance and check in at the main information desk  You need to re-schedule your appointment should you arrive 10 or more minutes late.  We strive to give you quality time with our providers, and arriving late affects you and other patients whose appointments are after yours.  Also, if you no show three or more times for appointments you may be dismissed from the clinic at the providers discretion.     Again, thank you for choosing [facility].  Our hope is that these requests will decrease the amount of time that you wait before being seen by our physicians.       _____________________________________________________________  Should you have questions after your visit to [facility], please contact our office at [phone] between the hours of 8:00 a.m. and 4:30 p.m.  Voicemails left after 4:00 p.m. will not be returned until the following business day.  For prescription refill requests, have your pharmacy contact our office and allow 72 hours.    Cancer Center Support Programs:   > Cancer Support Group  2nd Tuesday of the month 1pm-2pm, Journey Room    

## 2018-08-14 ENCOUNTER — Ambulatory Visit
Admission: RE | Admit: 2018-08-14 | Discharge: 2018-08-14 | Disposition: A | Discharge status: Home / Self Care | Payer: 59 | Source: Ambulatory Visit | Attending: Radiation Oncology | Admitting: Radiation Oncology

## 2018-08-14 ENCOUNTER — Other Ambulatory Visit: Payer: Self-pay

## 2018-08-14 DIAGNOSIS — C20 Malignant neoplasm of rectum: Secondary | ICD-10-CM | POA: Diagnosis not present

## 2018-08-14 DIAGNOSIS — E785 Hyperlipidemia, unspecified: Secondary | ICD-10-CM | POA: Diagnosis not present

## 2018-08-14 DIAGNOSIS — R634 Abnormal weight loss: Secondary | ICD-10-CM | POA: Diagnosis not present

## 2018-08-14 DIAGNOSIS — K625 Hemorrhage of anus and rectum: Secondary | ICD-10-CM | POA: Diagnosis not present

## 2018-08-14 DIAGNOSIS — R197 Diarrhea, unspecified: Secondary | ICD-10-CM | POA: Diagnosis not present

## 2018-08-14 DIAGNOSIS — R5383 Other fatigue: Secondary | ICD-10-CM | POA: Diagnosis not present

## 2018-08-14 DIAGNOSIS — K5900 Constipation, unspecified: Secondary | ICD-10-CM | POA: Diagnosis not present

## 2018-08-14 DIAGNOSIS — C787 Secondary malignant neoplasm of liver and intrahepatic bile duct: Secondary | ICD-10-CM | POA: Diagnosis not present

## 2018-08-14 DIAGNOSIS — R110 Nausea: Secondary | ICD-10-CM | POA: Diagnosis not present

## 2018-08-15 ENCOUNTER — Ambulatory Visit
Admission: RE | Admit: 2018-08-15 | Discharge: 2018-08-15 | Disposition: A | Discharge status: Home / Self Care | Payer: 59 | Source: Ambulatory Visit | Attending: Radiation Oncology | Admitting: Radiation Oncology

## 2018-08-15 ENCOUNTER — Other Ambulatory Visit: Payer: Self-pay

## 2018-08-15 ENCOUNTER — Other Ambulatory Visit (HOSPITAL_COMMUNITY): Payer: Self-pay | Admitting: *Deleted

## 2018-08-15 DIAGNOSIS — K5900 Constipation, unspecified: Secondary | ICD-10-CM | POA: Diagnosis not present

## 2018-08-15 DIAGNOSIS — E785 Hyperlipidemia, unspecified: Secondary | ICD-10-CM | POA: Diagnosis not present

## 2018-08-15 DIAGNOSIS — R110 Nausea: Secondary | ICD-10-CM | POA: Diagnosis not present

## 2018-08-15 DIAGNOSIS — C787 Secondary malignant neoplasm of liver and intrahepatic bile duct: Secondary | ICD-10-CM | POA: Diagnosis not present

## 2018-08-15 DIAGNOSIS — K625 Hemorrhage of anus and rectum: Secondary | ICD-10-CM | POA: Diagnosis not present

## 2018-08-15 DIAGNOSIS — C20 Malignant neoplasm of rectum: Secondary | ICD-10-CM | POA: Diagnosis not present

## 2018-08-15 DIAGNOSIS — R634 Abnormal weight loss: Secondary | ICD-10-CM | POA: Diagnosis not present

## 2018-08-15 DIAGNOSIS — R5383 Other fatigue: Secondary | ICD-10-CM | POA: Diagnosis not present

## 2018-08-15 DIAGNOSIS — R197 Diarrhea, unspecified: Secondary | ICD-10-CM | POA: Diagnosis not present

## 2018-08-15 NOTE — Progress Notes (Signed)
Patient called and she is being told that she is a little more yellow that her usual. Her bilirubin was elevated earlier this week.   I talked with Dr. Delton Coombes and orders received for LFTs on Friday. Patient can not come on Friday due to her work schedule, so she will come late tomorrow evening.

## 2018-08-16 ENCOUNTER — Other Ambulatory Visit: Payer: Self-pay

## 2018-08-16 ENCOUNTER — Other Ambulatory Visit (HOSPITAL_COMMUNITY)
Admission: RE | Admit: 2018-08-16 | Discharge: 2018-08-16 | Disposition: A | Discharge status: Home / Self Care | Payer: 59 | Source: Ambulatory Visit | Attending: Hematology | Admitting: Hematology

## 2018-08-16 ENCOUNTER — Other Ambulatory Visit (HOSPITAL_COMMUNITY): Payer: Self-pay | Admitting: Hematology

## 2018-08-16 ENCOUNTER — Ambulatory Visit
Admission: RE | Admit: 2018-08-16 | Discharge: 2018-08-16 | Disposition: A | Discharge status: Home / Self Care | Payer: 59 | Source: Ambulatory Visit | Attending: Radiation Oncology | Admitting: Radiation Oncology

## 2018-08-16 DIAGNOSIS — C20 Malignant neoplasm of rectum: Secondary | ICD-10-CM

## 2018-08-16 DIAGNOSIS — E785 Hyperlipidemia, unspecified: Secondary | ICD-10-CM | POA: Diagnosis not present

## 2018-08-16 DIAGNOSIS — R110 Nausea: Secondary | ICD-10-CM | POA: Diagnosis not present

## 2018-08-16 DIAGNOSIS — K625 Hemorrhage of anus and rectum: Secondary | ICD-10-CM | POA: Diagnosis not present

## 2018-08-16 DIAGNOSIS — R634 Abnormal weight loss: Secondary | ICD-10-CM | POA: Diagnosis not present

## 2018-08-16 DIAGNOSIS — R5383 Other fatigue: Secondary | ICD-10-CM | POA: Diagnosis not present

## 2018-08-16 DIAGNOSIS — K5900 Constipation, unspecified: Secondary | ICD-10-CM | POA: Diagnosis not present

## 2018-08-16 DIAGNOSIS — C787 Secondary malignant neoplasm of liver and intrahepatic bile duct: Secondary | ICD-10-CM | POA: Diagnosis not present

## 2018-08-16 DIAGNOSIS — R197 Diarrhea, unspecified: Secondary | ICD-10-CM | POA: Diagnosis not present

## 2018-08-16 LAB — HEPATIC FUNCTION PANEL
ALT: 20 U/L (ref 0–44)
AST: 33 U/L (ref 15–41)
Albumin: 4.2 g/dL (ref 3.5–5.0)
Alkaline Phosphatase: 103 U/L (ref 38–126)
Bilirubin, Direct: 0.3 mg/dL — ABNORMAL HIGH (ref 0.0–0.2)
Indirect Bilirubin: 3.7 mg/dL — ABNORMAL HIGH (ref 0.3–0.9)
Total Bilirubin: 4 mg/dL — ABNORMAL HIGH (ref 0.3–1.2)
Total Protein: 6.6 g/dL (ref 6.5–8.1)

## 2018-08-17 ENCOUNTER — Other Ambulatory Visit: Payer: Self-pay

## 2018-08-17 ENCOUNTER — Ambulatory Visit
Admission: RE | Admit: 2018-08-17 | Discharge: 2018-08-17 | Disposition: A | Discharge status: Home / Self Care | Payer: 59 | Source: Ambulatory Visit | Attending: Radiation Oncology | Admitting: Radiation Oncology

## 2018-08-17 DIAGNOSIS — R110 Nausea: Secondary | ICD-10-CM | POA: Diagnosis not present

## 2018-08-17 DIAGNOSIS — C787 Secondary malignant neoplasm of liver and intrahepatic bile duct: Secondary | ICD-10-CM | POA: Diagnosis not present

## 2018-08-17 DIAGNOSIS — K5900 Constipation, unspecified: Secondary | ICD-10-CM | POA: Diagnosis not present

## 2018-08-17 DIAGNOSIS — C20 Malignant neoplasm of rectum: Secondary | ICD-10-CM

## 2018-08-17 DIAGNOSIS — R634 Abnormal weight loss: Secondary | ICD-10-CM | POA: Diagnosis not present

## 2018-08-17 DIAGNOSIS — R5383 Other fatigue: Secondary | ICD-10-CM | POA: Diagnosis not present

## 2018-08-17 DIAGNOSIS — E785 Hyperlipidemia, unspecified: Secondary | ICD-10-CM | POA: Diagnosis not present

## 2018-08-17 DIAGNOSIS — R197 Diarrhea, unspecified: Secondary | ICD-10-CM | POA: Diagnosis not present

## 2018-08-17 DIAGNOSIS — K625 Hemorrhage of anus and rectum: Secondary | ICD-10-CM | POA: Diagnosis not present

## 2018-08-17 MED ORDER — SONAFINE EX EMUL
1.0000 "application " | Freq: Once | CUTANEOUS | Status: AC
  Administered 2018-08-17: 1 via TOPICAL

## 2018-08-21 ENCOUNTER — Encounter (HOSPITAL_COMMUNITY): Payer: Self-pay | Admitting: Hematology

## 2018-08-21 ENCOUNTER — Other Ambulatory Visit: Payer: Self-pay

## 2018-08-21 ENCOUNTER — Inpatient Hospital Stay (HOSPITAL_BASED_OUTPATIENT_CLINIC_OR_DEPARTMENT_OTHER): Payer: 59 | Admitting: Hematology

## 2018-08-21 ENCOUNTER — Ambulatory Visit
Admission: RE | Admit: 2018-08-21 | Discharge: 2018-08-21 | Disposition: A | Discharge status: Home / Self Care | Payer: 59 | Source: Ambulatory Visit | Attending: Radiation Oncology | Admitting: Radiation Oncology

## 2018-08-21 ENCOUNTER — Inpatient Hospital Stay (HOSPITAL_COMMUNITY): Payer: 59

## 2018-08-21 VITALS — BP 107/69 | HR 79 | Temp 98.3°F | Resp 18 | Wt 160.9 lb

## 2018-08-21 DIAGNOSIS — K625 Hemorrhage of anus and rectum: Secondary | ICD-10-CM

## 2018-08-21 DIAGNOSIS — Z8249 Family history of ischemic heart disease and other diseases of the circulatory system: Secondary | ICD-10-CM

## 2018-08-21 DIAGNOSIS — R110 Nausea: Secondary | ICD-10-CM | POA: Diagnosis not present

## 2018-08-21 DIAGNOSIS — K5900 Constipation, unspecified: Secondary | ICD-10-CM | POA: Diagnosis not present

## 2018-08-21 DIAGNOSIS — Z85850 Personal history of malignant neoplasm of thyroid: Secondary | ICD-10-CM

## 2018-08-21 DIAGNOSIS — R5383 Other fatigue: Secondary | ICD-10-CM

## 2018-08-21 DIAGNOSIS — E785 Hyperlipidemia, unspecified: Secondary | ICD-10-CM

## 2018-08-21 DIAGNOSIS — C20 Malignant neoplasm of rectum: Secondary | ICD-10-CM | POA: Diagnosis not present

## 2018-08-21 DIAGNOSIS — Z8042 Family history of malignant neoplasm of prostate: Secondary | ICD-10-CM

## 2018-08-21 DIAGNOSIS — R197 Diarrhea, unspecified: Secondary | ICD-10-CM | POA: Diagnosis not present

## 2018-08-21 DIAGNOSIS — C787 Secondary malignant neoplasm of liver and intrahepatic bile duct: Secondary | ICD-10-CM | POA: Diagnosis not present

## 2018-08-21 DIAGNOSIS — Z87891 Personal history of nicotine dependence: Secondary | ICD-10-CM

## 2018-08-21 DIAGNOSIS — Z79899 Other long term (current) drug therapy: Secondary | ICD-10-CM

## 2018-08-21 DIAGNOSIS — R634 Abnormal weight loss: Secondary | ICD-10-CM | POA: Diagnosis not present

## 2018-08-21 DIAGNOSIS — F419 Anxiety disorder, unspecified: Secondary | ICD-10-CM

## 2018-08-21 LAB — CBC WITH DIFFERENTIAL/PLATELET
Abs Immature Granulocytes: 0.04 10*3/uL (ref 0.00–0.07)
Basophils Absolute: 0 10*3/uL (ref 0.0–0.1)
Basophils Relative: 0 %
Eosinophils Absolute: 0 10*3/uL (ref 0.0–0.5)
Eosinophils Relative: 1 %
HCT: 31.2 % — ABNORMAL LOW (ref 36.0–46.0)
Hemoglobin: 10.8 g/dL — ABNORMAL LOW (ref 12.0–15.0)
Immature Granulocytes: 1 %
Lymphocytes Relative: 13 %
Lymphs Abs: 0.5 10*3/uL — ABNORMAL LOW (ref 0.7–4.0)
MCH: 34.4 pg — ABNORMAL HIGH (ref 26.0–34.0)
MCHC: 34.6 g/dL (ref 30.0–36.0)
MCV: 99.4 fL (ref 80.0–100.0)
Monocytes Absolute: 0.3 10*3/uL (ref 0.1–1.0)
Monocytes Relative: 7 %
Neutro Abs: 3.1 10*3/uL (ref 1.7–7.7)
Neutrophils Relative %: 78 %
Platelets: 110 10*3/uL — ABNORMAL LOW (ref 150–400)
RBC: 3.14 MIL/uL — ABNORMAL LOW (ref 3.87–5.11)
RDW: 18.3 % — ABNORMAL HIGH (ref 11.5–15.5)
WBC: 4 10*3/uL (ref 4.0–10.5)
nRBC: 0 % (ref 0.0–0.2)

## 2018-08-21 LAB — COMPREHENSIVE METABOLIC PANEL
ALT: 20 U/L (ref 0–44)
AST: 31 U/L (ref 15–41)
Albumin: 4.3 g/dL (ref 3.5–5.0)
Alkaline Phosphatase: 108 U/L (ref 38–126)
Anion gap: 10 (ref 5–15)
BUN: 15 mg/dL (ref 6–20)
CO2: 23 mmol/L (ref 22–32)
Calcium: 9 mg/dL (ref 8.9–10.3)
Chloride: 105 mmol/L (ref 98–111)
Creatinine, Ser: 0.66 mg/dL (ref 0.44–1.00)
GFR calc Af Amer: >60 mL/min (ref >60)
GFR calc non Af Amer: >60 mL/min (ref >60)
Glucose, Bld: 101 mg/dL — ABNORMAL HIGH (ref 70–99)
Potassium: 3.3 mmol/L — ABNORMAL LOW (ref 3.5–5.1)
Sodium: 138 mmol/L (ref 135–145)
Total Bilirubin: 3.7 mg/dL — ABNORMAL HIGH (ref 0.3–1.2)
Total Protein: 7.3 g/dL (ref 6.5–8.1)

## 2018-08-21 LAB — MAGNESIUM: Magnesium: 2.2 mg/dL (ref 1.7–2.4)

## 2018-08-21 NOTE — Progress Notes (Signed)
[place], [place] [address]   CLINIC:  Medical Oncology/Hematology  PCP:  [name], MD [address] [place] [address] [phone]   REASON FOR VISIT:  Follow-up for Rectal Ca   CURRENT THERAPY: Xeloda  BRIEF ONCOLOGIC HISTORY:    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[facility])   02/26/2018 Initial Diagnosis    Rectal cancer ([facility])    03/13/2018 Cancer Staging    Staging form: Colon and Rectum, AJCC 8th Edition - Clinical stage from 03/13/2018: Stage IVA (cT3, cN1b, cM1a) - Signed by Derek Jack, MD on 03/13/2018    03/14/2018 -  Chemotherapy    The patient had palonosetron (ALOXI) injection 0.25 mg, 0.25 mg, Intravenous,  Once, 7 of 8 cycles Administration: 0.25 mg (03/14/2018), 0.25 mg (03/27/2018), 0.25 mg (04/18/2018), 0.25 mg (05/02/2018), 0.25 mg (05/16/2018), 0.25 mg (06/05/2018), 0.25 mg (06/27/2018) leucovorin 800 mg in dextrose 5 % 250 mL infusion, 772 mg, Intravenous,  Once, 7 of 8 cycles Administration: 800 mg (03/14/2018), 800 mg (03/27/2018), 800 mg (05/02/2018), 800 mg (05/16/2018), 700 mg (04/18/2018), 800 mg (06/05/2018), 800 mg (06/27/2018) oxaliplatin (ELOXATIN) 165 mg in dextrose 5 % 500 mL chemo infusion, 85 mg/m2 = 165 mg, Intravenous,  Once, 6 of 7 cycles Dose modification: 68 mg/m2 (80 % of original dose 85 mg/m2, Cycle 4, Reason: Provider Judgment) Administration: 165 mg (03/14/2018), 165 mg (03/27/2018), 130 mg (05/02/2018), 130 mg (05/16/2018), 130 mg (06/05/2018), 130 mg (06/27/2018) panitumumab (VECTIBIX) 500 mg in sodium chloride 0.9 % 100 mL chemo infusion, 480 mg, Intravenous,  Once, 4 of 5 cycles Administration: 500 mg (04/18/2018), 480 mg (05/02/2018), 480 mg (05/16/2018), 480 mg (06/05/2018) fluorouracil (ADRUCIL) chemo injection 750 mg, 400 mg/m2 = 750 mg (100 % of original dose 400 mg/m2), Intravenous,  Once, 6 of 7 cycles Dose modification: 400 mg/m2 (original dose 400 mg/m2, Cycle 1), 400 mg/m2 (original dose  400 mg/m2, Cycle 3) Administration: 750 mg (03/14/2018), 750 mg (04/18/2018), 750 mg (05/02/2018), 750 mg (05/16/2018), 750 mg (06/05/2018), 750 mg (06/27/2018) fosaprepitant (EMEND) 150 mg, dexamethasone (DECADRON) 12 mg in sodium chloride 0.9 % 145 mL IVPB, , Intravenous,  Once, 4 of 5 cycles Administration:  (05/02/2018),  (05/16/2018),  (06/05/2018),  (06/27/2018) fluorouracil (ADRUCIL) 4,650 mg in sodium chloride 0.9 % 57 mL chemo infusion, 2,400 mg/m2 = 4,650 mg, Intravenous, 1 Day/Dose, 7 of 8 cycles Administration: 4,650 mg (03/14/2018), 4,650 mg (03/27/2018), 4,650 mg (04/18/2018), 4,650 mg (05/02/2018), 4,650 mg (05/16/2018), 4,650 mg (06/05/2018), 4,650 mg (06/27/2018)  for chemotherapy treatment.       CANCER STAGING: Cancer Staging Malignant neoplasm of rectum [facility]) Staging form: Colon and Rectum, AJCC 8th Edition - Clinical stage from 03/13/2018: Stage IVA (cT3, cN1b, cM1a) - Signed by Derek Jack, MD on 03/13/2018    INTERVAL HISTORY:  Samantha Reynolds 53 y.o. female presents today for follow up. Reports overall doing well. She continues on Xeloda, tolerating well. Reports intermittent nausea that is worse in the evenings. Nausea is controlled with PRN antiemetics. No vomiting. Reports diarrhea improved with PRN imodium. Overall, she is doing well.  Appetite and energy levels are 25%.   REVIEW OF SYSTEMS:  Review of Systems  Constitutional: Positive for appetite change.  HENT:  Negative.   Eyes: Negative.   Respiratory: Negative.   Cardiovascular: Negative.   Gastrointestinal: Positive for constipation, diarrhea and nausea.  Endocrine: Negative.   Genitourinary: Negative.    Musculoskeletal: Negative.   Skin: Negative.   Neurological: Negative.  Hematological: Negative.   Psychiatric/Behavioral: Negative.      PAST MEDICAL/SURGICAL HISTORY:  Past Medical History:  Diagnosis Date  . Anxiety   . Cancer [facility]) 2013   thyroid  . Endometrial polyp   . Endometrial thickening on  ultra sound   . GERD (gastroesophageal reflux disease)   . 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 H/O Hashimoto thyroiditis   . 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 Hyperlipidemia 11/09/2015  . Hypothyroidism   . 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[facility]) 02/26/2018   Past Surgical History:  Procedure Laterality Date  . BIOPSY  02/19/2018   Procedure: BIOPSY;  Surgeon: Rogene Houston, MD;  [facility];  Service: Endoscopy;;  rectum  . COLONOSCOPY N/A 08/20/2015   Procedure: COLONOSCOPY;  Surgeon: Rogene Houston, MD;  [facility];  Service: Endoscopy;  Laterality: N/A;  730  . [place] ENDOMETRIAL ABLATION  08-13-2004  . FLEXIBLE SIGMOIDOSCOPY N/A 02/19/2018   Procedure: FLEXIBLE SIGMOIDOSCOPY;  Surgeon: Rogene Houston, MD;  [facility];  Service: Endoscopy;  Laterality: N/A;  . HYSTEROSCOPY W/D&C N/A 04/30/2015   Procedure: DILATATION AND CURETTAGE / INTENDED HYSTEROSCOPY;  Surgeon: Dian Queen, MD;  Location: [facility];  Service: Gynecology;  Laterality: N/A;  . IR US GUIDE BX ASP/DRAIN  03/09/2018  . LAPAROSCOPIC CHOLECYSTECTOMY  04/1996  . POLYPECTOMY  08/20/2015   Procedure: POLYPECTOMY;  Surgeon: Rogene Houston, MD;  [facility];  Service: Endoscopy;;  Splenic flexure polypectomy  . POLYPECTOMY  02/19/2018   Procedure: POLYPECTOMY;  Surgeon: Rogene Houston, MD;  [facility];  Service: Endoscopy;;  rectum  . PORTACATH PLACEMENT N/A 03/14/2018   Procedure: INSERTION PORT-A-CATH;  Surgeon: Aviva Signs, MD;  Location: [facility];  Service: General;  Laterality: N/A;  . REDUCTION INCARCERATED UTERUS  06-20-2000   intrauterine preg. 13 wks /  urinary retention  . THYROID LOBECTOMY  11/24/2011   Procedure: THYROID LOBECTOMY;  Surgeon: Earnstine Regal, MD;   Location: [facility];  Service: General;  Laterality: Left;  Left Thyroid Lobectomy  . TUBAL LIGATION  2002     SOCIAL HISTORY:  Social History   Socioeconomic History  . Marital status: Married    Spouse name: Not on file  . Number of children: 4  . Years of education: Not on file  . Highest education level: Not on file  Occupational History    Comment: Marketing executive at [employer]  . Financial resource strain: Not hard at all  . Food insecurity:    Worry: Never true    Inability: Never true  . Transportation needs:    Medical: No    Non-medical: No  Tobacco Use  . Smoking status: Former Smoker    Packs/day: 0.25    Years: 10.00    Pack years: 2.50    Types: Cigarettes    Last attempt to quit: 10/31/1991    Years since quitting: 26.8  . Smokeless tobacco: Never Used  Substance and Sexual Activity  . Alcohol use: No  . Drug use: No  . Sexual activity: Not Currently  Lifestyle  . Physical activity:    Days per week: 0 days    Minutes per session: 0 min  . Stress: To some extent  Relationships  .  Social connections:    Talks on phone: More than three times a week    Gets together: Twice a week    Attends religious service: More than 4 times per year    Active member of club or organization: Yes    Attends meetings of clubs or organizations: More than 4 times per year    Relationship status: Married  . Intimate partner violence:    Fear of current or ex partner: No    Emotionally abused: No    Physically abused: No    Forced sexual activity: No  Other Topics Concern  . Not on file  Social History Narrative  . Not on file    FAMILY HISTORY:  Family History  Problem Relation Age of Onset  . Hypertension Mother   . Hypertension Father   . Prostate cancer Father   . Cancer Maternal Aunt        spine/back  . Cancer Paternal Grandfather        lung  . Healthy Son   . Healthy Son   . Healthy Son   . Healthy Son   . Colon cancer Neg Hx     CURRENT  MEDICATIONS:  Outpatient Encounter Medications as of 08/21/2018  Medication Sig  . ALPRAZolam (XANAX) 0.5 MG tablet Take 1 tablet (0.5 mg total) by mouth 2 (two) times daily as needed for anxiety.  . capecitabine (XELODA) 500 MG tablet Take 3 tablets (1,500 mg total) by mouth 2 (two) times daily after a meal. Take Monday through Friday throughout the course of radiation.  . lidocaine-prilocaine (EMLA) cream Apply small amount to port site one hour prior to appointment and cover with plastic wrap.  . loperamide (IMODIUM) 2 MG capsule Take 2 mg by mouth as needed for diarrhea or loose stools.  . ondansetron (ZOFRAN) 4 MG tablet Take 1 tablet (4 mg total) by mouth every 8 (eight) hours as needed for nausea or vomiting.  . thyroid (ARMOUR) 90 MG tablet Take 90 mg by mouth daily before breakfast.    Facility-Administered Encounter Medications as of 08/21/2018  Medication  . sodium chloride 0.9 % 1,000 mL with potassium chloride 20 mEq, magnesium sulfate 2 g infusion    ALLERGIES:  I have reviewed them.  PHYSICAL EXAM:  ECOG Performance status: 0  Vitals:   08/21/18 1104  BP: 107/69  Pulse: 79  Resp: 18  Temp: 98.3 F (36.8 C)  SpO2: 100%   Filed Weights   08/21/18 1104  Weight: 160 lb 14.4 oz (73 kg)    Physical Exam Vitals signs reviewed.  Constitutional:      Appearance: Normal appearance.  Cardiovascular:     Rate and Rhythm: Normal rate and regular rhythm.     Heart sounds: Normal heart sounds.  Pulmonary:     Effort: Pulmonary effort is normal.     Breath sounds: Normal breath sounds.  Abdominal:     General: There is no distension.     Palpations: Abdomen is soft. There is no mass.  Musculoskeletal:        General: No swelling.  Skin:    General: Skin is warm.  Neurological:     General: No focal deficit present.     Mental Status: She is alert and oriented to person, place, and time.  Psychiatric:        Mood and Affect: Mood normal.        Behavior: Behavior  normal.      LABORATORY DATA:  I have reviewed the labs as listed.  CBC    Component Value Date/Time   WBC 4.0 08/21/2018 0929   RBC 3.14 (L) 08/21/2018 0929   HGB 10.8 (L) 08/21/2018 0929   HGB 13.5 11/03/2015 0841   HCT 31.2 (L) 08/21/2018 0929   HCT 39.7 11/03/2015 0841   PLT 110 (L) 08/21/2018 0929   PLT 165 11/03/2015 0841   MCV 99.4 08/21/2018 0929   MCV 95 11/03/2015 0841   MCH 34.4 (H) 08/21/2018 0929   MCHC 34.6 08/21/2018 0929   RDW 18.3 (H) 08/21/2018 0929   RDW 13.2 11/03/2015 0841   LYMPHSABS 0.5 (L) 08/21/2018 0929   LYMPHSABS 1.4 11/03/2015 0841   MONOABS 0.3 08/21/2018 0929   EOSABS 0.0 08/21/2018 0929   EOSABS 0.1 11/03/2015 0841   BASOSABS 0.0 08/21/2018 0929   BASOSABS 0.0 11/03/2015 0841   CMP Latest Ref Rng & Units 08/21/2018 08/16/2018 08/13/2018  Glucose 70 - 99 mg/dL 101(H) - 86  BUN 6 - 20 mg/dL 15 - 11  Creatinine 0.44 - 1.00 mg/dL 0.66 - 0.68  Sodium 135 - 145 mmol/L 138 - 140  Potassium 3.5 - 5.1 mmol/L 3.3(L) - 3.3(L)  Chloride 98 - 111 mmol/L 105 - 105  CO2 22 - 32 mmol/L 23 - 23  Calcium 8.9 - 10.3 mg/dL 9.0 - 9.1  Total Protein 6.5 - 8.1 g/dL 7.3 6.6 7.2  Total Bilirubin 0.3 - 1.2 mg/dL 3.7(H) 4.0(H) 3.5(H)  Alkaline Phos 38 - 126 U/L 108 103 118  AST 15 - 41 U/L 31 33 35  ALT 0 - 44 U/L '20 20 24       '$ DIAGNOSTIC IMAGING:  I have independently reviewed the scans and discussed with the patient.   I have reviewed Reynolds Bowl, NP's note and agree with the documentation.  I personally performed a face-to-face visit, made revisions and my assessment and plan is as follows.    ASSESSMENT & PLAN:   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[facility]) 1.  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 - Colonoscopy on 02/19/2018 shows  rectal mass, 2 to 4 cm from the anal verge, normal sigmoid colon.  Biopsies consistent with adenocarcinoma. - MRI of the pelvis shows rectal adenocarcinoma, T stage-T3, N stage- N1-equivocal N2 nodes.  Maximum extension beyond muscularis propria is 6 mm (advanced T3).  Circumferential resection margin is 7 mm.  Right obturator node 6 mm.  Right external iliac node at 8 mm.  Craniocaudal extent of the tumor is 4.4 cm.  Approximately 180 degree circumferential.  Shortest distance from the distal tumor to anal sphincter is 2.1 cm.  Location from anal verge, 7.2 cm to the middle of the tumor. - CT CAP on 02/21/2018 shows 9 x 7 mm left lower lobe pulmonary nodule, new since 2009.  Possible subpleural left lower lobe 3 mm nodule versus area of pleural thickening.  Vague area of hypoattenuation in the posterior right hepatic lobe measuring 1.6 cm.  Differential includes benign/malignant lesion with altered perfusion.  Focal steatosis is possible. - MRI of the liver on 03/08/2018 shows 1.4 cm lesion in the posterior right hepatic lobe.  There is a 5 mm focus of delayed hyperenhancement in the lateral segment of the left liver, questionable for a second metastatic deposit. -PET CT scan  on 03/05/2018 shows hypermetabolic rectal mass.  Left perirectal hypermetabolic and sigmoid mesocolon lymph nodes.  Single hepatic metastatic lesion in the right hepatic lobe.  7 mm pulmonary nodule at the left lung base is suspicious for meta stasis. -Liver biopsy on 03/09/2018 consistent with adenocarcinoma.  - Cycle 1 FOLFOX on 03/14/2018.  She had constipation lasting for 5 days.  She also had cold sensitivity for few days.  She had some nausea but denied any vomiting. - Cycle 2 of FOLFOX on 03/27/2018. -She was admitted to the hospital from 03/27/2018 through 03/29/2018 with neutropenic fever and sepsis.  She received IV antibiotics. 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  Low starting dose of Irinotecan is indicated. -Because of her elevated total bilirubin to above 5, cycle 3 was given on 04/18/2018 without oxaliplatin.  However vectibix was started. - She did have 1 day of severe weakness after last chemotherapy.  However she did very well without cold sensitivity. -She was evaluated by Dr. Marcille Buffy at transplant hepatology clinic at [facility].  They felt that Irinotecan should be avoided. - Oxaliplatin was dose reduced by 20% during cycle 4 on 05/02/2018. - 7 cycles of FOLFOX with vectibix completed on 06/27/2018.  Vectibix was left out during cycle 7.   - PET scan on 06/21/2018 showed interval resolution of the hypermetabolic area associated with right lobe of the liver metastasis.  Spleen has slightly enlarged to 13.4 cm in length and 12.9 cm previously.  Interval decrease in the radiotracer uptake associated with rectal neoplasm with SUV of 5 compared to 17.5 previously.  Previously noted FDG avid sigmoid mesocolon lymph nodes have resolved. - MRI of the liver on 06/25/2018 shows decrease in size of the liver metastasis to 5 mm, from 15 mm previously.  No new lesions noted. -Patient attended a multidisciplinary consultation with physicians at [facility] as a virtual visit. -She was recommended to have ablative therapy to the liver lesion.  She underwent ultrasound of her liver which did not show liver lesion.  Hence local treatment for liver lesion was put on hold at this time. - XRT with Xeloda 1500 mg twice daily started on 07/30/2018.  - Total bilirubin today has improved to 3.7.  This was for the last week.  Platelet count improved to 110. -She does not have any clinical signs or symptoms of hand-foot skin reaction. -Nausea is well controlled with Zofran in the morning and Compazine in the evening. - She will continue with her Xeloda.  She is drinking protein drinks 1-2 times per day.  2.  Thyroid cancer: -She had a left thyroidectomy done  about 6 years ago.  She is on Synthroid at this time. -TSH, thyroglobulin and antithyroglobulin antibodies were normal.      Orders placed this encounter:  Orders Placed This Encounter  Procedures  . CBC with Differential  . Comprehensive metabolic panel  . Magnesium      Derek Jack, MD [place] [phone]

## 2018-08-21 NOTE — Assessment & Plan Note (Addendum)
1.  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 - Colonoscopy on 02/19/2018 shows rectal mass, 2 to 4 cm from the anal verge, normal sigmoid colon.  Biopsies consistent with adenocarcinoma. - MRI of the pelvis shows rectal adenocarcinoma, T stage-T3, N stage- N1-equivocal N2 nodes.  Maximum extension beyond muscularis propria is 6 mm (advanced T3).  Circumferential resection margin is 7 mm.  Right obturator node 6 mm.  Right external iliac node at 8 mm.  Craniocaudal extent of the tumor is 4.4 cm.  Approximately 180 degree circumferential.  Shortest distance from the distal tumor to anal sphincter is 2.1 cm.  Location from anal verge, 7.2 cm to the middle of the tumor. - CT CAP on 02/21/2018 shows 9 x 7 mm left lower lobe pulmonary nodule, new since 2009.  Possible subpleural left lower lobe 3 mm nodule versus area of pleural thickening.  Vague area of hypoattenuation in the posterior right hepatic lobe measuring 1.6 cm.  Differential includes benign/malignant lesion with altered perfusion.  Focal steatosis is possible. - MRI of the liver on 03/08/2018 shows 1.4 cm lesion in the posterior right hepatic lobe.  There is a 5 mm focus of delayed hyperenhancement in the lateral segment of the left liver, questionable for a second metastatic deposit. -PET CT scan on 03/05/2018 shows hypermetabolic rectal mass.  Left perirectal hypermetabolic and sigmoid mesocolon lymph nodes.  Single hepatic metastatic lesion in the right hepatic lobe.  7 mm pulmonary nodule at the left lung base is suspicious for meta stasis. -Liver biopsy on 03/09/2018 consistent with adenocarcinoma.  - Cycle 1 FOLFOX on 03/14/2018.  She had constipation lasting for 5 days.  She also had cold sensitivity for few days.  She had some  nausea but denied any vomiting. - Cycle 2 of FOLFOX on 03/27/2018. -She was admitted to the hospital from 03/27/2018 through 03/29/2018 with neutropenic fever and sepsis.  She received IV antibiotics. 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  Low starting dose of Irinotecan is indicated. -Because of her elevated total bilirubin to above 5, cycle 3 was given on 04/18/2018 without oxaliplatin.  However vectibix was started. - She did have 1 day of severe weakness after last chemotherapy.  However she did very well without cold sensitivity. -She was evaluated by Dr. Marcille Buffy at transplant hepatology clinic at [facility].  They felt that Irinotecan should be avoided. - Oxaliplatin was dose reduced by 20% during cycle 4 on 05/02/2018. - 7 cycles of FOLFOX with vectibix completed on 06/27/2018.  Vectibix was left out during cycle 7.   - PET scan on 06/21/2018 showed interval resolution of the hypermetabolic area associated with right lobe of the liver metastasis.  Spleen has slightly enlarged to 13.4 cm in length and 12.9 cm previously.  Interval decrease in the radiotracer uptake associated with rectal neoplasm with SUV of 5 compared to 17.5 previously.  Previously noted FDG avid sigmoid mesocolon lymph nodes have resolved. - MRI of the liver on 06/25/2018 shows decrease in size of the liver metastasis to 5 mm, from 15 mm previously.  No new lesions noted. -Patient attended a multidisciplinary consultation with physicians at [facility] as a virtual visit. -She was recommended to have ablative therapy to  the liver lesion.  She underwent ultrasound of her liver which did not show liver lesion.  Hence local treatment for liver lesion was put on hold at this time. - XRT with Xeloda 1500 mg twice daily started on 07/30/2018.  - Total bilirubin today has improved to 3.7.  This was for the last week.  Platelet count improved to 110. -She does not  have any clinical signs or symptoms of hand-foot skin reaction. -Nausea is well controlled with Zofran in the morning and Compazine in the evening. - She will continue with her Xeloda.  She is drinking protein drinks 1-2 times per day.  2.  Thyroid cancer: -She had a left thyroidectomy done about 6 years ago.  She is on Synthroid at this time. -TSH, thyroglobulin and antithyroglobulin antibodies were normal.

## 2018-08-22 ENCOUNTER — Other Ambulatory Visit: Payer: Self-pay

## 2018-08-22 ENCOUNTER — Ambulatory Visit
Admission: RE | Admit: 2018-08-22 | Discharge: 2018-08-22 | Disposition: A | Discharge status: Home / Self Care | Payer: 59 | Source: Ambulatory Visit | Attending: Radiation Oncology | Admitting: Radiation Oncology

## 2018-08-22 DIAGNOSIS — C20 Malignant neoplasm of rectum: Secondary | ICD-10-CM | POA: Diagnosis not present

## 2018-08-22 DIAGNOSIS — R197 Diarrhea, unspecified: Secondary | ICD-10-CM | POA: Diagnosis not present

## 2018-08-22 DIAGNOSIS — R110 Nausea: Secondary | ICD-10-CM | POA: Diagnosis not present

## 2018-08-22 DIAGNOSIS — K625 Hemorrhage of anus and rectum: Secondary | ICD-10-CM | POA: Diagnosis not present

## 2018-08-22 DIAGNOSIS — C787 Secondary malignant neoplasm of liver and intrahepatic bile duct: Secondary | ICD-10-CM | POA: Diagnosis not present

## 2018-08-22 DIAGNOSIS — R634 Abnormal weight loss: Secondary | ICD-10-CM | POA: Diagnosis not present

## 2018-08-22 DIAGNOSIS — R5383 Other fatigue: Secondary | ICD-10-CM | POA: Diagnosis not present

## 2018-08-22 DIAGNOSIS — K5900 Constipation, unspecified: Secondary | ICD-10-CM | POA: Diagnosis not present

## 2018-08-22 DIAGNOSIS — E785 Hyperlipidemia, unspecified: Secondary | ICD-10-CM | POA: Diagnosis not present

## 2018-08-23 ENCOUNTER — Other Ambulatory Visit: Payer: Self-pay

## 2018-08-23 ENCOUNTER — Other Ambulatory Visit (HOSPITAL_COMMUNITY): Payer: Self-pay | Admitting: *Deleted

## 2018-08-23 ENCOUNTER — Ambulatory Visit
Admission: RE | Admit: 2018-08-23 | Discharge: 2018-08-23 | Disposition: A | Discharge status: Home / Self Care | Payer: 59 | Source: Ambulatory Visit | Attending: Radiation Oncology | Admitting: Radiation Oncology

## 2018-08-23 DIAGNOSIS — C20 Malignant neoplasm of rectum: Secondary | ICD-10-CM | POA: Diagnosis not present

## 2018-08-23 DIAGNOSIS — K5900 Constipation, unspecified: Secondary | ICD-10-CM | POA: Diagnosis not present

## 2018-08-23 DIAGNOSIS — R110 Nausea: Secondary | ICD-10-CM | POA: Diagnosis not present

## 2018-08-23 DIAGNOSIS — K625 Hemorrhage of anus and rectum: Secondary | ICD-10-CM | POA: Diagnosis not present

## 2018-08-23 DIAGNOSIS — R197 Diarrhea, unspecified: Secondary | ICD-10-CM | POA: Diagnosis not present

## 2018-08-23 DIAGNOSIS — R634 Abnormal weight loss: Secondary | ICD-10-CM | POA: Diagnosis not present

## 2018-08-23 DIAGNOSIS — C787 Secondary malignant neoplasm of liver and intrahepatic bile duct: Secondary | ICD-10-CM | POA: Diagnosis not present

## 2018-08-23 DIAGNOSIS — R5383 Other fatigue: Secondary | ICD-10-CM | POA: Diagnosis not present

## 2018-08-23 DIAGNOSIS — E785 Hyperlipidemia, unspecified: Secondary | ICD-10-CM | POA: Diagnosis not present

## 2018-08-23 MED ORDER — CAPECITABINE 500 MG PO TABS: 1500.0000 mg | ORAL_TABLET | Freq: Two times a day (BID) | ORAL | 0 refills | Status: DC

## 2018-08-23 MED FILL — CAPECITABINE 500 MG TABS: 500 | 28 days supply | Qty: 120 | Fill #0

## 2018-08-23 NOTE — Telephone Encounter (Signed)
Chart reviewed, xeloda refilled.  

## 2018-08-24 ENCOUNTER — Other Ambulatory Visit: Payer: Self-pay

## 2018-08-24 ENCOUNTER — Ambulatory Visit
Admission: RE | Admit: 2018-08-24 | Discharge: 2018-08-24 | Disposition: A | Discharge status: Home / Self Care | Payer: 59 | Source: Ambulatory Visit | Attending: Radiation Oncology | Admitting: Radiation Oncology

## 2018-08-24 DIAGNOSIS — K5900 Constipation, unspecified: Secondary | ICD-10-CM | POA: Diagnosis not present

## 2018-08-24 DIAGNOSIS — E785 Hyperlipidemia, unspecified: Secondary | ICD-10-CM | POA: Diagnosis not present

## 2018-08-24 DIAGNOSIS — C787 Secondary malignant neoplasm of liver and intrahepatic bile duct: Secondary | ICD-10-CM | POA: Diagnosis not present

## 2018-08-24 DIAGNOSIS — R634 Abnormal weight loss: Secondary | ICD-10-CM | POA: Diagnosis not present

## 2018-08-24 DIAGNOSIS — C20 Malignant neoplasm of rectum: Secondary | ICD-10-CM | POA: Diagnosis not present

## 2018-08-24 DIAGNOSIS — R197 Diarrhea, unspecified: Secondary | ICD-10-CM | POA: Diagnosis not present

## 2018-08-24 DIAGNOSIS — R110 Nausea: Secondary | ICD-10-CM | POA: Diagnosis not present

## 2018-08-24 DIAGNOSIS — R5383 Other fatigue: Secondary | ICD-10-CM | POA: Diagnosis not present

## 2018-08-24 DIAGNOSIS — K625 Hemorrhage of anus and rectum: Secondary | ICD-10-CM | POA: Diagnosis not present

## 2018-08-27 ENCOUNTER — Inpatient Hospital Stay (HOSPITAL_COMMUNITY): Payer: 59

## 2018-08-27 ENCOUNTER — Ambulatory Visit
Admission: RE | Admit: 2018-08-27 | Discharge: 2018-08-27 | Disposition: A | Discharge status: Home / Self Care | Payer: 59 | Source: Ambulatory Visit | Attending: Radiation Oncology | Admitting: Radiation Oncology

## 2018-08-27 ENCOUNTER — Ambulatory Visit (HOSPITAL_COMMUNITY): Payer: 59 | Admitting: Hematology

## 2018-08-27 ENCOUNTER — Other Ambulatory Visit (HOSPITAL_COMMUNITY): Payer: 59

## 2018-08-27 ENCOUNTER — Other Ambulatory Visit: Payer: Self-pay

## 2018-08-27 ENCOUNTER — Inpatient Hospital Stay (HOSPITAL_BASED_OUTPATIENT_CLINIC_OR_DEPARTMENT_OTHER): Payer: 59 | Admitting: Hematology

## 2018-08-27 ENCOUNTER — Encounter (HOSPITAL_COMMUNITY): Payer: Self-pay | Admitting: Hematology

## 2018-08-27 VITALS — BP 104/70 | HR 88 | Temp 98.0°F | Resp 18 | Wt 158.9 lb

## 2018-08-27 DIAGNOSIS — Z8042 Family history of malignant neoplasm of prostate: Secondary | ICD-10-CM

## 2018-08-27 DIAGNOSIS — D701 Agranulocytosis secondary to cancer chemotherapy: Secondary | ICD-10-CM | POA: Insufficient documentation

## 2018-08-27 DIAGNOSIS — C20 Malignant neoplasm of rectum: Secondary | ICD-10-CM

## 2018-08-27 DIAGNOSIS — E876 Hypokalemia: Secondary | ICD-10-CM | POA: Insufficient documentation

## 2018-08-27 DIAGNOSIS — C787 Secondary malignant neoplasm of liver and intrahepatic bile duct: Secondary | ICD-10-CM | POA: Diagnosis not present

## 2018-08-27 DIAGNOSIS — R630 Anorexia: Secondary | ICD-10-CM

## 2018-08-27 DIAGNOSIS — R911 Solitary pulmonary nodule: Secondary | ICD-10-CM | POA: Insufficient documentation

## 2018-08-27 DIAGNOSIS — Z8249 Family history of ischemic heart disease and other diseases of the circulatory system: Secondary | ICD-10-CM

## 2018-08-27 DIAGNOSIS — R110 Nausea: Secondary | ICD-10-CM

## 2018-08-27 DIAGNOSIS — F419 Anxiety disorder, unspecified: Secondary | ICD-10-CM | POA: Diagnosis not present

## 2018-08-27 DIAGNOSIS — Z510 Encounter for antineoplastic radiation therapy: Secondary | ICD-10-CM | POA: Diagnosis not present

## 2018-08-27 DIAGNOSIS — R5383 Other fatigue: Secondary | ICD-10-CM

## 2018-08-27 DIAGNOSIS — D6959 Other secondary thrombocytopenia: Secondary | ICD-10-CM | POA: Diagnosis not present

## 2018-08-27 DIAGNOSIS — K625 Hemorrhage of anus and rectum: Secondary | ICD-10-CM | POA: Diagnosis not present

## 2018-08-27 DIAGNOSIS — K5900 Constipation, unspecified: Secondary | ICD-10-CM

## 2018-08-27 DIAGNOSIS — R200 Anesthesia of skin: Secondary | ICD-10-CM | POA: Diagnosis not present

## 2018-08-27 DIAGNOSIS — Z87891 Personal history of nicotine dependence: Secondary | ICD-10-CM

## 2018-08-27 DIAGNOSIS — R634 Abnormal weight loss: Secondary | ICD-10-CM | POA: Diagnosis not present

## 2018-08-27 DIAGNOSIS — Z85850 Personal history of malignant neoplasm of thyroid: Secondary | ICD-10-CM

## 2018-08-27 DIAGNOSIS — Z79899 Other long term (current) drug therapy: Secondary | ICD-10-CM

## 2018-08-27 LAB — COMPREHENSIVE METABOLIC PANEL
ALT: 18 U/L (ref 0–44)
AST: 31 U/L (ref 15–41)
Albumin: 4.3 g/dL (ref 3.5–5.0)
Alkaline Phosphatase: 99 U/L (ref 38–126)
Anion gap: 10 (ref 5–15)
BUN: 14 mg/dL (ref 6–20)
CO2: 25 mmol/L (ref 22–32)
Calcium: 9.1 mg/dL (ref 8.9–10.3)
Chloride: 103 mmol/L (ref 98–111)
Creatinine, Ser: 0.64 mg/dL (ref 0.44–1.00)
GFR calc Af Amer: >60 mL/min (ref >60)
GFR calc non Af Amer: >60 mL/min (ref >60)
Glucose, Bld: 97 mg/dL (ref 70–99)
Potassium: 3.1 mmol/L — ABNORMAL LOW (ref 3.5–5.1)
Sodium: 138 mmol/L (ref 135–145)
Total Bilirubin: 4.2 mg/dL — ABNORMAL HIGH (ref 0.3–1.2)
Total Protein: 7.4 g/dL (ref 6.5–8.1)

## 2018-08-27 LAB — CBC WITH DIFFERENTIAL/PLATELET
Abs Immature Granulocytes: 0.05 10*3/uL (ref 0.00–0.07)
Basophils Absolute: 0 10*3/uL (ref 0.0–0.1)
Basophils Relative: 0 %
Eosinophils Absolute: 0 10*3/uL (ref 0.0–0.5)
Eosinophils Relative: 1 %
HCT: 31.1 % — ABNORMAL LOW (ref 36.0–46.0)
Hemoglobin: 10.8 g/dL — ABNORMAL LOW (ref 12.0–15.0)
Immature Granulocytes: 1 %
Lymphocytes Relative: 12 %
Lymphs Abs: 0.4 10*3/uL — ABNORMAL LOW (ref 0.7–4.0)
MCH: 34.7 pg — ABNORMAL HIGH (ref 26.0–34.0)
MCHC: 34.7 g/dL (ref 30.0–36.0)
MCV: 100 fL (ref 80.0–100.0)
Monocytes Absolute: 0.3 10*3/uL (ref 0.1–1.0)
Monocytes Relative: 8 %
Neutro Abs: 2.7 10*3/uL (ref 1.7–7.7)
Neutrophils Relative %: 78 %
Platelets: 119 10*3/uL — ABNORMAL LOW (ref 150–400)
RBC: 3.11 MIL/uL — ABNORMAL LOW (ref 3.87–5.11)
RDW: 18.7 % — ABNORMAL HIGH (ref 11.5–15.5)
WBC: 3.5 10*3/uL — ABNORMAL LOW (ref 4.0–10.5)
nRBC: 0 % (ref 0.0–0.2)

## 2018-08-27 LAB — MAGNESIUM: Magnesium: 2.1 mg/dL (ref 1.7–2.4)

## 2018-08-27 MED ORDER — POTASSIUM CHLORIDE CRYS ER 20 MEQ PO TBCR
20.0000 meq | EXTENDED_RELEASE_TABLET | Freq: Once | ORAL | Status: AC
  Administered 2018-08-27: 12:00:00 20 meq via ORAL
  Filled 2018-08-27: qty 1

## 2018-08-27 MED ORDER — POTASSIUM CHLORIDE CRYS ER 20 MEQ PO TBCR: 20.0000 meq | EXTENDED_RELEASE_TABLET | Freq: Every day | ORAL | 2 refills | Status: DC

## 2018-08-27 MED FILL — POTASSIUM CHLORIDE CRYS ER: 20 | 30 days supply | Qty: 30 | Fill #0

## 2018-08-27 NOTE — Assessment & Plan Note (Signed)
1.  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 - Colonoscopy on 02/19/2018 shows rectal mass, 2 to 4 cm from the anal verge, normal sigmoid colon.  Biopsies consistent with adenocarcinoma. - MRI of the pelvis shows rectal adenocarcinoma, T stage-T3, N stage- N1-equivocal N2 nodes.  Maximum extension beyond muscularis propria is 6 mm (advanced T3).  Circumferential resection margin is 7 mm.  Right obturator node 6 mm.  Right external iliac node at 8 mm.  Craniocaudal extent of the tumor is 4.4 cm.  Approximately 180 degree circumferential.  Shortest distance from the distal tumor to anal sphincter is 2.1 cm.  Location from anal verge, 7.2 cm to the middle of the tumor. - CT CAP on 02/21/2018 shows 9 x 7 mm left lower lobe pulmonary nodule, new since 2009.  Possible subpleural left lower lobe 3 mm nodule versus area of pleural thickening.  Vague area of hypoattenuation in the posterior right hepatic lobe measuring 1.6 cm.  Differential includes benign/malignant lesion with altered perfusion.  Focal steatosis is possible. - MRI of the liver on 03/08/2018 shows 1.4 cm lesion in the posterior right hepatic lobe.  There is a 5 mm focus of delayed hyperenhancement in the lateral segment of the left liver, questionable for a second metastatic deposit. -PET CT scan on 03/05/2018 shows hypermetabolic rectal mass.  Left perirectal hypermetabolic and sigmoid mesocolon lymph nodes.  Single hepatic metastatic lesion in the right hepatic lobe.  7 mm pulmonary nodule at the left lung base is suspicious for meta stasis. -Liver biopsy on 03/09/2018 consistent with adenocarcinoma.  - Cycle 1 FOLFOX on 03/14/2018.  She had constipation lasting for 5 days.  She also had cold sensitivity for few days.  She had some  nausea but denied any vomiting. - Cycle 2 of FOLFOX on 03/27/2018. -She was admitted to the hospital from 03/27/2018 through 03/29/2018 with neutropenic fever and sepsis.  She received IV antibiotics. 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  Low starting dose of Irinotecan is indicated. -Because of her elevated total bilirubin to above 5, cycle 3 was given on 04/18/2018 without oxaliplatin.  However vectibix was started. - She did have 1 day of severe weakness after last chemotherapy.  However she did very well without cold sensitivity. -She was evaluated by Dr. Marcille Buffy at transplant hepatology clinic at [facility].  They felt that Irinotecan should be avoided. - Oxaliplatin was dose reduced by 20% during cycle 4 on 05/02/2018. - 7 cycles of FOLFOX with vectibix completed on 06/27/2018.  Vectibix was left out during cycle 7.   - PET scan on 06/21/2018 showed interval resolution of the hypermetabolic area associated with right lobe of the liver metastasis.  Spleen has slightly enlarged to 13.4 cm in length and 12.9 cm previously.  Interval decrease in the radiotracer uptake associated with rectal neoplasm with SUV of 5 compared to 17.5 previously.  Previously noted FDG avid sigmoid mesocolon lymph nodes have resolved. - MRI of the liver on 06/25/2018 shows decrease in size of the liver metastasis to 5 mm, from 15 mm previously.  No new lesions noted. -Patient attended a multidisciplinary consultation with physicians at [facility] as a virtual visit. -She was recommended to have ablative therapy to  the liver lesion.  She underwent ultrasound of her liver which did not show liver lesion.  Hence local treatment for liver lesion was put on hold at this time. - XRT with Xeloda 1500 mg twice daily started on 07/30/2018.  She is doing Xeloda Monday through Friday. -Total bilirubin is 4.2 today. - She reported complete loss of appetite.  She is  drinking protein shakes 3/day.  She is also eating yogurt 2 times a day.  She lost about 2 pounds in the last 1 week.  Total weight loss is about 8 pounds since the start of therapy. -She does not have any hand-foot skin reaction.  No skin rashes noted. -Her potassium is low at 3.1.  She will receive potassium 20 mEq today in office.  We will start her on potassium 20 mEq daily. I shall reevaluate her in 1 week.  2.  Thyroid cancer: -She had a left thyroidectomy done about 6 years ago.  She is on Synthroid at this time. -TSH, thyroglobulin and antithyroglobulin antibodies were normal.

## 2018-08-27 NOTE — Progress Notes (Signed)
Samantha Reynolds, [place] [id number]   CLINIC:  Medical Oncology/Hematology  PCP:  [name], MD [address] [place] [address] [phone]   REASON FOR VISIT:  Follow-up for rectal cancer    BRIEF ONCOLOGIC HISTORY:    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[place])   02/26/2018 Initial Diagnosis    Rectal cancer ([place])    03/13/2018 Cancer Staging    Staging form: Colon and Rectum, AJCC 8th Edition - Clinical stage from 03/13/2018: Stage IVA (cT3, cN1b, cM1a) - Signed by Derek Jack, MD on 03/13/2018    03/14/2018 -  Chemotherapy    The patient had palonosetron (ALOXI) injection 0.25 mg, 0.25 mg, Intravenous,  Once, 7 of 8 cycles Administration: 0.25 mg (03/14/2018), 0.25 mg (03/27/2018), 0.25 mg (04/18/2018), 0.25 mg (05/02/2018), 0.25 mg (05/16/2018), 0.25 mg (06/05/2018), 0.25 mg (06/27/2018) leucovorin 800 mg in dextrose 5 % 250 mL infusion, 772 mg, Intravenous,  Once, 7 of 8 cycles Administration: 800 mg (03/14/2018), 800 mg (03/27/2018), 800 mg (05/02/2018), 800 mg (05/16/2018), 700 mg (04/18/2018), 800 mg (06/05/2018), 800 mg (06/27/2018) oxaliplatin (ELOXATIN) 165 mg in dextrose 5 % 500 mL chemo infusion, 85 mg/m2 = 165 mg, Intravenous,  Once, 6 of 7 cycles Dose modification: 68 mg/m2 (80 % of original dose 85 mg/m2, Cycle 4, Reason: Provider Judgment) Administration: 165 mg (03/14/2018), 165 mg (03/27/2018), 130 mg (05/02/2018), 130 mg (05/16/2018), 130 mg (06/05/2018), 130 mg (06/27/2018) panitumumab (VECTIBIX) 500 mg in sodium chloride 0.9 % 100 mL chemo infusion, 480 mg, Intravenous,  Once, 4 of 5 cycles Administration: 500 mg (04/18/2018), 480 mg (05/02/2018), 480 mg (05/16/2018), 480 mg (06/05/2018) fluorouracil (ADRUCIL) chemo injection 750 mg, 400 mg/m2 = 750 mg (100 % of original dose 400 mg/m2), Intravenous,  Once, 6 of 7 cycles Dose modification: 400 mg/m2 (original dose 400 mg/m2, Cycle 1), 400 mg/m2 (original dose 400 mg/m2, Cycle 3)  Administration: 750 mg (03/14/2018), 750 mg (04/18/2018), 750 mg (05/02/2018), 750 mg (05/16/2018), 750 mg (06/05/2018), 750 mg (06/27/2018) fosaprepitant (EMEND) 150 mg, dexamethasone (DECADRON) 12 mg in sodium chloride 0.9 % 145 mL IVPB, , Intravenous,  Once, 4 of 5 cycles Administration:  (05/02/2018),  (05/16/2018),  (06/05/2018),  (06/27/2018) fluorouracil (ADRUCIL) 4,650 mg in sodium chloride 0.9 % 57 mL chemo infusion, 2,400 mg/m2 = 4,650 mg, Intravenous, 1 Day/Dose, 7 of 8 cycles Administration: 4,650 mg (03/14/2018), 4,650 mg (03/27/2018), 4,650 mg (04/18/2018), 4,650 mg (05/02/2018), 4,650 mg (05/16/2018), 4,650 mg (06/05/2018), 4,650 mg (06/27/2018)  for chemotherapy treatment.       CANCER STAGING: Cancer Staging Malignant neoplasm of rectum [facility]) Staging form: Colon and Rectum, AJCC 8th Edition - Clinical stage from 03/13/2018: Stage IVA (cT3, cN1b, cM1a) - Signed by Derek Jack, MD on 03/13/2018    INTERVAL HISTORY:  Samantha Reynolds 53 y.o. female seen for follow-up of rectal cancer.  She is continued chemoradiation therapy with Xeloda 3 tablets twice daily Monday through Friday.  He reports fatigue and complete loss of appetite in the last 1 week.  She is drinking protein shakes up to 3/day.  She is eating some solid food.  She is eating yogurt twice daily.  She lost about 2 pounds in last 1 week.  She also reports some bleeding on the tissue paper every time she has a bowel movement.  She does have some constipation.  Appetite is very low and energy levels are also very low.  No fevers or infections noted.  REVIEW OF SYSTEMS:  Review of Systems  Constitutional: Positive for fatigue.  Gastrointestinal: Positive for blood in stool, constipation, diarrhea and nausea.  Neurological: Positive for numbness.  All other systems reviewed and are negative.    PAST MEDICAL/SURGICAL HISTORY:  Past Medical History:  Diagnosis Date  . Anxiety   . Cancer [facility]) 2013   thyroid  . Endometrial polyp    . Endometrial thickening on ultra sound   . GERD (gastroesophageal reflux disease)   . 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 H/O Hashimoto thyroiditis   . 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 Hyperlipidemia 11/09/2015  . Hypothyroidism   . 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[place]) 02/26/2018   Past Surgical History:  Procedure Laterality Date  . BIOPSY  02/19/2018   Procedure: BIOPSY;  Surgeon: Rogene Houston, MD;  [facility];  Service: Endoscopy;;  rectum  . COLONOSCOPY N/A 08/20/2015   Procedure: COLONOSCOPY;  Surgeon: Rogene Houston, MD;  [facility];  Service: Endoscopy;  Laterality: N/A;  730  . [place] ENDOMETRIAL ABLATION  08-13-2004  . FLEXIBLE SIGMOIDOSCOPY N/A 02/19/2018   Procedure: FLEXIBLE SIGMOIDOSCOPY;  Surgeon: Rogene Houston, MD;  [facility];  Service: Endoscopy;  Laterality: N/A;  . HYSTEROSCOPY W/D&C N/A 04/30/2015   Procedure: DILATATION AND CURETTAGE / INTENDED HYSTEROSCOPY;  Surgeon: Dian Queen, MD;  Location: [place];  Service: Gynecology;  Laterality: N/A;  . IR US GUIDE BX ASP/DRAIN  03/09/2018  . LAPAROSCOPIC CHOLECYSTECTOMY  04/1996  . POLYPECTOMY  08/20/2015   Procedure: POLYPECTOMY;  Surgeon: Rogene Houston, MD;  [facility];  Service: Endoscopy;;  Splenic flexure polypectomy  . POLYPECTOMY  02/19/2018   Procedure: POLYPECTOMY;  Surgeon: Rogene Houston, MD;  [facility];  Service: Endoscopy;;  rectum  . PORTACATH PLACEMENT N/A 03/14/2018   Procedure: INSERTION PORT-A-CATH;  Surgeon: Aviva Signs, MD;  Location: [facility];  Service: General;  Laterality: N/A;  . REDUCTION INCARCERATED UTERUS  06-20-2000   intrauterine preg. 13 wks /  urinary retention  . THYROID LOBECTOMY  11/24/2011   Procedure: THYROID LOBECTOMY;   Surgeon: Earnstine Regal, MD;  Location: [facility];  Service: General;  Laterality: Left;  Left Thyroid Lobectomy  . TUBAL LIGATION  2002     SOCIAL HISTORY:  Social History   Socioeconomic History  . Marital status: Married    Spouse name: Not on file  . Number of children: 4  . Years of education: Not on file  . Highest education level: Not on file  Occupational History    Comment: Marketing executive at [employer]  . Financial resource strain: Not hard at all  . Food insecurity:    Worry: Never true    Inability: Never true  . Transportation needs:    Medical: No    Non-medical: No  Tobacco Use  . Smoking status: Former Smoker    Packs/day: 0.25    Years: 10.00    Pack years: 2.50    Types: Cigarettes    Last attempt to quit: 10/31/1991    Years since quitting: 26.8  . Smokeless tobacco: Never Used  Substance and Sexual Activity  . Alcohol use: No  . Drug use: No  . Sexual activity: Not Currently  Lifestyle  . Physical activity:  Days per week: 0 days    Minutes per session: 0 min  . Stress: To some extent  Relationships  . Social connections:    Talks on phone: More than three times a week    Gets together: Twice a week    Attends religious service: More than 4 times per year    Active member of club or organization: Yes    Attends meetings of clubs or organizations: More than 4 times per year    Relationship status: Married  . Intimate partner violence:    Fear of current or ex partner: No    Emotionally abused: No    Physically abused: No    Forced sexual activity: No  Other Topics Concern  . Not on file  Social History Narrative  . Not on file    FAMILY HISTORY:  Family History  Problem Relation Age of Onset  . Hypertension Mother   . Hypertension Father   . Prostate cancer Father   . Cancer Maternal Aunt        spine/back  . Cancer Paternal Grandfather        lung  . Healthy Son   . Healthy Son   . Healthy Son   . Healthy Son   . Colon  cancer Neg Hx     CURRENT MEDICATIONS:  Outpatient Encounter Medications as of 08/27/2018  Medication Sig  . ALPRAZolam (XANAX) 0.5 MG tablet Take 1 tablet (0.5 mg total) by mouth 2 (two) times daily as needed for anxiety.  . capecitabine (XELODA) 500 MG tablet Take 3 tablets (1,500 mg total) by mouth 2 (two) times daily after a meal. Take Monday through Friday throughout the course of radiation.  . lidocaine-prilocaine (EMLA) cream Apply small amount to port site one hour prior to appointment and cover with plastic wrap.  . loperamide (IMODIUM) 2 MG capsule Take 2 mg by mouth as needed for diarrhea or loose stools.  . ondansetron (ZOFRAN) 4 MG tablet Take 1 tablet (4 mg total) by mouth every 8 (eight) hours as needed for nausea or vomiting.  . thyroid (ARMOUR) 90 MG tablet Take 90 mg by mouth daily before breakfast.   . potassium chloride SA (K-DUR) 20 MEQ tablet Take 1 tablet (20 mEq total) by mouth daily.   Facility-Administered Encounter Medications as of 08/27/2018  Medication  . [COMPLETED] potassium chloride SA (K-DUR) CR tablet 20 mEq  . sodium chloride 0.9 % 1,000 mL with potassium chloride 20 mEq, magnesium sulfate 2 g infusion    ALLERGIES:  I have reviewed her drug allergies.  PHYSICAL EXAM:  ECOG Performance status: 0  Vitals:   08/27/18 1128  BP: 104/70  Pulse: 88  Resp: 18  Temp: 98 F (36.7 C)  SpO2: 100%   Filed Weights   08/27/18 1128  Weight: 158 lb 14.4 oz (72.1 kg)    Physical Exam Vitals signs reviewed.  Constitutional:      Appearance: Normal appearance.  Cardiovascular:     Rate and Rhythm: Normal rate and regular rhythm.     Heart sounds: Normal heart sounds.  Pulmonary:     Effort: Pulmonary effort is normal.     Breath sounds: Normal breath sounds.  Abdominal:     General: There is no distension.     Palpations: Abdomen is soft. There is no mass.  Musculoskeletal:        General: No swelling.  Skin:    General: Skin is warm.   Neurological:  General: No focal deficit present.     Mental Status: She is alert and oriented to person, place, and time.  Psychiatric:        Mood and Affect: Mood normal.        Behavior: Behavior normal.      LABORATORY DATA:  I have reviewed the labs as listed.  CBC    Component Value Date/Time   WBC 3.5 (L) 08/27/2018 0915   RBC 3.11 (L) 08/27/2018 0915   HGB 10.8 (L) 08/27/2018 0915   HGB 13.5 11/03/2015 0841   HCT 31.1 (L) 08/27/2018 0915   HCT 39.7 11/03/2015 0841   PLT 119 (L) 08/27/2018 0915   PLT 165 11/03/2015 0841   MCV 100.0 08/27/2018 0915   MCV 95 11/03/2015 0841   MCH 34.7 (H) 08/27/2018 0915   MCHC 34.7 08/27/2018 0915   RDW 18.7 (H) 08/27/2018 0915   RDW 13.2 11/03/2015 0841   LYMPHSABS 0.4 (L) 08/27/2018 0915   LYMPHSABS 1.4 11/03/2015 0841   MONOABS 0.3 08/27/2018 0915   EOSABS 0.0 08/27/2018 0915   EOSABS 0.1 11/03/2015 0841   BASOSABS 0.0 08/27/2018 0915   BASOSABS 0.0 11/03/2015 0841   CMP Latest Ref Rng & Units 08/27/2018 08/21/2018 08/16/2018  Glucose 70 - 99 mg/dL 97 101(H) -  BUN 6 - 20 mg/dL 14 15 -  Creatinine 0.44 - 1.00 mg/dL 0.64 0.66 -  Sodium 135 - 145 mmol/L 138 138 -  Potassium 3.5 - 5.1 mmol/L 3.1(L) 3.3(L) -  Chloride 98 - 111 mmol/L 103 105 -  CO2 22 - 32 mmol/L 25 23 -  Calcium 8.9 - 10.3 mg/dL 9.1 9.0 -  Total Protein 6.5 - 8.1 g/dL 7.4 7.3 6.6  Total Bilirubin 0.3 - 1.2 mg/dL 4.2(H) 3.7(H) 4.0(H)  Alkaline Phos 38 - 126 U/L 99 108 103  AST 15 - 41 U/L 31 31 33  ALT 0 - 44 U/L '18 20 20       '$ DIAGNOSTIC IMAGING:  I have independently reviewed the scans and discussed with the patient.    ASSESSMENT & PLAN:   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[facility]) 1.  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 -  Colonoscopy on 02/19/2018 shows rectal mass, 2 to 4 cm from the anal verge, normal sigmoid colon.  Biopsies consistent with adenocarcinoma. - MRI of the pelvis shows rectal adenocarcinoma, T stage-T3, N stage- N1-equivocal N2 nodes.  Maximum extension beyond muscularis propria is 6 mm (advanced T3).  Circumferential resection margin is 7 mm.  Right obturator node 6 mm.  Right external iliac node at 8 mm.  Craniocaudal extent of the tumor is 4.4 cm.  Approximately 180 degree circumferential.  Shortest distance from the distal tumor to anal sphincter is 2.1 cm.  Location from anal verge, 7.2 cm to the middle of the tumor. - CT CAP on 02/21/2018 shows 9 x 7 mm left lower lobe pulmonary nodule, new since 2009.  Possible subpleural left lower lobe 3 mm nodule versus area of pleural thickening.  Vague area of hypoattenuation in the posterior right hepatic lobe measuring 1.6 cm.  Differential includes benign/malignant lesion with altered perfusion.  Focal steatosis is possible. - MRI of the liver on 03/08/2018 shows 1.4 cm lesion in the posterior right hepatic lobe.  There is a  5 mm focus of delayed hyperenhancement in the lateral segment of the left liver, questionable for a second metastatic deposit. -PET CT scan on 03/05/2018 shows hypermetabolic rectal mass.  Left perirectal hypermetabolic and sigmoid mesocolon lymph nodes.  Single hepatic metastatic lesion in the right hepatic lobe.  7 mm pulmonary nodule at the left lung base is suspicious for meta stasis. -Liver biopsy on 03/09/2018 consistent with adenocarcinoma.  - Cycle 1 FOLFOX on 03/14/2018.  She had constipation lasting for 5 days.  She also had cold sensitivity for few days.  She had some nausea but denied any vomiting. - Cycle 2 of FOLFOX on 03/27/2018. -She was admitted to the hospital from 03/27/2018 through 03/29/2018 with neutropenic fever and sepsis.  She received IV antibiotics. 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  Low starting dose of Irinotecan is indicated. -Because of her elevated total bilirubin to above 5, cycle 3 was given on 04/18/2018 without oxaliplatin.  However vectibix was started. - She did have 1 day of severe weakness after last chemotherapy.  However she did very well without cold sensitivity. -She was evaluated by Dr. Marcille Buffy at transplant hepatology clinic at [facility].  They felt that Irinotecan should be avoided. - Oxaliplatin was dose reduced by 20% during cycle 4 on 05/02/2018. - 7 cycles of FOLFOX with vectibix completed on 06/27/2018.  Vectibix was left out during cycle 7.   - PET scan on 06/21/2018 showed interval resolution of the hypermetabolic area associated with right lobe of the liver metastasis.  Spleen has slightly enlarged to 13.4 cm in length and 12.9 cm previously.  Interval decrease in the radiotracer uptake associated with rectal neoplasm with SUV of 5 compared to 17.5 previously.  Previously noted FDG avid sigmoid mesocolon lymph nodes have resolved. - MRI of the liver on 06/25/2018 shows decrease in size of the liver metastasis to 5 mm, from 15 mm previously.  No new lesions noted. -Patient attended a multidisciplinary consultation with physicians at [facility] as a virtual visit. -She was recommended to have ablative therapy to the liver lesion.  She underwent ultrasound of her liver which did not show liver lesion.  Hence local treatment for liver lesion was put on hold at this time. - XRT with Xeloda 1500 mg twice daily started on 07/30/2018.  She is doing Xeloda Monday through Friday. -Total bilirubin is 4.2 today. - She reported complete loss of appetite.  She is drinking protein shakes 3/day.  She is also eating yogurt 2 times a day.  She lost about 2 pounds in the last 1 week.  Total weight loss is about 8 pounds since the start of therapy. -She does not have any hand-foot skin reaction.  No skin rashes noted. -Her potassium  is low at 3.1.  She will receive potassium 20 mEq today in office.  We will start her on potassium 20 mEq daily. I shall reevaluate her in 1 week.  2.  Thyroid cancer: -She had a left thyroidectomy done about 6 years ago.  She is on Synthroid at this time. -TSH, thyroglobulin and antithyroglobulin antibodies were normal.      Orders placed this encounter:  No orders of the defined types were placed in this encounter.     Derek Jack, MD [place] [phone]

## 2018-08-27 NOTE — Patient Instructions (Addendum)
[facility] at [facility] Discharge Instructions  You were seen today by Dr. Katragadda. He went over your recent lab results. He will see you back in 1 week for labs and follow up.   Thank you for choosing [facility] at [facility] to provide your oncology and hematology care.  To afford each patient quality time with our provider, please arrive at least 15 minutes before your scheduled appointment time.   If you have a lab appointment with the Cancer Center please come in thru the  Main Entrance and check in at the main information desk  You need to re-schedule your appointment should you arrive 10 or more minutes late.  We strive to give you quality time with our providers, and arriving late affects you and other patients whose appointments are after yours.  Also, if you no show three or more times for appointments you may be dismissed from the clinic at the providers discretion.     Again, thank you for choosing [facility].  Our hope is that these requests will decrease the amount of time that you wait before being seen by our physicians.       _____________________________________________________________  Should you have questions after your visit to  Cancer Center, please contact our office at [phone] between the hours of 8:00 a.m. and 4:30 p.m.  Voicemails left after 4:00 p.m. will not be returned until the following business day.  For prescription refill requests, have your pharmacy contact our office and allow 72 hours.    Cancer Center Support Programs:   > Cancer Support Group  2nd Tuesday of the month 1pm-2pm, Journey Room    

## 2018-08-27 NOTE — Progress Notes (Signed)
08/27/18  Received order to add Potassium Chloride 25mEq orally to today's visit.  K+ = 3.1 today  V.O. Dr Beckey Downing LPN/Auriana Scalia Ronnald Ramp, PharmD

## 2018-08-28 ENCOUNTER — Ambulatory Visit
Admission: RE | Admit: 2018-08-28 | Discharge: 2018-08-28 | Disposition: A | Discharge status: Home / Self Care | Payer: 59 | Source: Ambulatory Visit | Attending: Radiation Oncology | Admitting: Radiation Oncology

## 2018-08-28 ENCOUNTER — Other Ambulatory Visit: Payer: Self-pay

## 2018-08-28 DIAGNOSIS — Z510 Encounter for antineoplastic radiation therapy: Secondary | ICD-10-CM | POA: Diagnosis not present

## 2018-08-28 DIAGNOSIS — C20 Malignant neoplasm of rectum: Secondary | ICD-10-CM | POA: Diagnosis not present

## 2018-08-29 ENCOUNTER — Other Ambulatory Visit: Payer: Self-pay

## 2018-08-29 ENCOUNTER — Ambulatory Visit
Admission: RE | Admit: 2018-08-29 | Discharge: 2018-08-29 | Disposition: A | Discharge status: Home / Self Care | Payer: 59 | Source: Ambulatory Visit | Attending: Radiation Oncology | Admitting: Radiation Oncology

## 2018-08-29 DIAGNOSIS — Z510 Encounter for antineoplastic radiation therapy: Secondary | ICD-10-CM | POA: Diagnosis not present

## 2018-08-29 DIAGNOSIS — C20 Malignant neoplasm of rectum: Secondary | ICD-10-CM | POA: Diagnosis not present

## 2018-08-30 ENCOUNTER — Other Ambulatory Visit: Payer: Self-pay | Admitting: Nurse Practitioner

## 2018-08-30 ENCOUNTER — Ambulatory Visit
Admission: RE | Admit: 2018-08-30 | Discharge: 2018-08-30 | Disposition: A | Discharge status: Home / Self Care | Payer: 59 | Source: Ambulatory Visit | Attending: Radiation Oncology | Admitting: Radiation Oncology

## 2018-08-30 DIAGNOSIS — C20 Malignant neoplasm of rectum: Secondary | ICD-10-CM

## 2018-08-30 DIAGNOSIS — Z510 Encounter for antineoplastic radiation therapy: Secondary | ICD-10-CM | POA: Diagnosis not present

## 2018-08-31 ENCOUNTER — Other Ambulatory Visit: Payer: Self-pay

## 2018-08-31 ENCOUNTER — Ambulatory Visit
Admission: RE | Admit: 2018-08-31 | Discharge: 2018-08-31 | Disposition: A | Discharge status: Home / Self Care | Payer: 59 | Source: Ambulatory Visit | Attending: Radiation Oncology | Admitting: Radiation Oncology

## 2018-08-31 DIAGNOSIS — C20 Malignant neoplasm of rectum: Secondary | ICD-10-CM | POA: Diagnosis not present

## 2018-08-31 DIAGNOSIS — Z510 Encounter for antineoplastic radiation therapy: Secondary | ICD-10-CM | POA: Diagnosis not present

## 2018-09-03 ENCOUNTER — Other Ambulatory Visit: Payer: Self-pay

## 2018-09-03 ENCOUNTER — Encounter (HOSPITAL_COMMUNITY): Payer: Self-pay | Admitting: Hematology

## 2018-09-03 ENCOUNTER — Inpatient Hospital Stay (HOSPITAL_COMMUNITY): Payer: 59

## 2018-09-03 ENCOUNTER — Ambulatory Visit
Admission: RE | Admit: 2018-09-03 | Discharge: 2018-09-03 | Disposition: A | Discharge status: Home / Self Care | Payer: 59 | Source: Ambulatory Visit | Attending: Radiation Oncology | Admitting: Radiation Oncology

## 2018-09-03 ENCOUNTER — Inpatient Hospital Stay (HOSPITAL_BASED_OUTPATIENT_CLINIC_OR_DEPARTMENT_OTHER): Payer: 59 | Admitting: Hematology

## 2018-09-03 VITALS — BP 116/72 | HR 85 | Temp 98.6°F | Resp 18 | Wt 156.0 lb

## 2018-09-03 DIAGNOSIS — C20 Malignant neoplasm of rectum: Secondary | ICD-10-CM | POA: Diagnosis not present

## 2018-09-03 DIAGNOSIS — K5900 Constipation, unspecified: Secondary | ICD-10-CM

## 2018-09-03 DIAGNOSIS — R5383 Other fatigue: Secondary | ICD-10-CM

## 2018-09-03 DIAGNOSIS — F329 Major depressive disorder, single episode, unspecified: Secondary | ICD-10-CM

## 2018-09-03 DIAGNOSIS — R634 Abnormal weight loss: Secondary | ICD-10-CM

## 2018-09-03 DIAGNOSIS — Z87891 Personal history of nicotine dependence: Secondary | ICD-10-CM

## 2018-09-03 DIAGNOSIS — R197 Diarrhea, unspecified: Secondary | ICD-10-CM | POA: Diagnosis not present

## 2018-09-03 DIAGNOSIS — Z510 Encounter for antineoplastic radiation therapy: Secondary | ICD-10-CM | POA: Diagnosis not present

## 2018-09-03 DIAGNOSIS — F419 Anxiety disorder, unspecified: Secondary | ICD-10-CM

## 2018-09-03 DIAGNOSIS — R110 Nausea: Secondary | ICD-10-CM | POA: Diagnosis not present

## 2018-09-03 DIAGNOSIS — K625 Hemorrhage of anus and rectum: Secondary | ICD-10-CM

## 2018-09-03 DIAGNOSIS — Z85850 Personal history of malignant neoplasm of thyroid: Secondary | ICD-10-CM

## 2018-09-03 DIAGNOSIS — C787 Secondary malignant neoplasm of liver and intrahepatic bile duct: Secondary | ICD-10-CM | POA: Diagnosis not present

## 2018-09-03 DIAGNOSIS — Z79899 Other long term (current) drug therapy: Secondary | ICD-10-CM

## 2018-09-03 DIAGNOSIS — E876 Hypokalemia: Secondary | ICD-10-CM

## 2018-09-03 DIAGNOSIS — Z8249 Family history of ischemic heart disease and other diseases of the circulatory system: Secondary | ICD-10-CM

## 2018-09-03 DIAGNOSIS — R630 Anorexia: Secondary | ICD-10-CM

## 2018-09-03 DIAGNOSIS — Z8042 Family history of malignant neoplasm of prostate: Secondary | ICD-10-CM

## 2018-09-03 LAB — CBC WITH DIFFERENTIAL/PLATELET
Abs Immature Granulocytes: 0.06 10*3/uL (ref 0.00–0.07)
Basophils Absolute: 0 10*3/uL (ref 0.0–0.1)
Basophils Relative: 0 %
Eosinophils Absolute: 0 10*3/uL (ref 0.0–0.5)
Eosinophils Relative: 1 %
HCT: 31 % — ABNORMAL LOW (ref 36.0–46.0)
Hemoglobin: 10.8 g/dL — ABNORMAL LOW (ref 12.0–15.0)
Immature Granulocytes: 1 %
Lymphocytes Relative: 8 %
Lymphs Abs: 0.4 10*3/uL — ABNORMAL LOW (ref 0.7–4.0)
MCH: 35.4 pg — ABNORMAL HIGH (ref 26.0–34.0)
MCHC: 34.8 g/dL (ref 30.0–36.0)
MCV: 101.6 fL — ABNORMAL HIGH (ref 80.0–100.0)
Monocytes Absolute: 0.3 10*3/uL (ref 0.1–1.0)
Monocytes Relative: 5 %
Neutro Abs: 4.2 10*3/uL (ref 1.7–7.7)
Neutrophils Relative %: 85 %
Platelets: 123 10*3/uL — ABNORMAL LOW (ref 150–400)
RBC: 3.05 MIL/uL — ABNORMAL LOW (ref 3.87–5.11)
RDW: 19.8 % — ABNORMAL HIGH (ref 11.5–15.5)
WBC: 5 10*3/uL (ref 4.0–10.5)
nRBC: 0 % (ref 0.0–0.2)

## 2018-09-03 LAB — COMPREHENSIVE METABOLIC PANEL
ALT: 19 U/L (ref 0–44)
AST: 31 U/L (ref 15–41)
Albumin: 4.7 g/dL (ref 3.5–5.0)
Alkaline Phosphatase: 91 U/L (ref 38–126)
Anion gap: 13 (ref 5–15)
BUN: 13 mg/dL (ref 6–20)
CO2: 21 mmol/L — ABNORMAL LOW (ref 22–32)
Calcium: 9.3 mg/dL (ref 8.9–10.3)
Chloride: 102 mmol/L (ref 98–111)
Creatinine, Ser: 0.75 mg/dL (ref 0.44–1.00)
GFR calc Af Amer: >60 mL/min (ref >60)
GFR calc non Af Amer: >60 mL/min (ref >60)
Glucose, Bld: 102 mg/dL — ABNORMAL HIGH (ref 70–99)
Potassium: 3.3 mmol/L — ABNORMAL LOW (ref 3.5–5.1)
Sodium: 136 mmol/L (ref 135–145)
Total Bilirubin: 5.9 mg/dL — ABNORMAL HIGH (ref 0.3–1.2)
Total Protein: 7.6 g/dL (ref 6.5–8.1)

## 2018-09-03 LAB — MAGNESIUM: Magnesium: 2.3 mg/dL (ref 1.7–2.4)

## 2018-09-03 LAB — TSH: TSH: 0.654 u[IU]/mL (ref 0.350–4.500)

## 2018-09-03 NOTE — Progress Notes (Signed)
[place], [place] [address]   CLINIC:  Medical Oncology/Hematology  PCP:  [name], MD [address] [place] [address] [phone]   REASON FOR VISIT:  Follow-up for rectal cancer   BRIEF ONCOLOGIC HISTORY:    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[facility])   02/26/2018 Initial Diagnosis    Rectal cancer ([place])    03/13/2018 Cancer Staging    Staging form: Colon and Rectum, AJCC 8th Edition - Clinical stage from 03/13/2018: Stage IVA (cT3, cN1b, cM1a) - Signed by Derek Jack, MD on 03/13/2018    03/14/2018 -  Chemotherapy    The patient had palonosetron (ALOXI) injection 0.25 mg, 0.25 mg, Intravenous,  Once, 7 of 8 cycles Administration: 0.25 mg (03/14/2018), 0.25 mg (03/27/2018), 0.25 mg (04/18/2018), 0.25 mg (05/02/2018), 0.25 mg (05/16/2018), 0.25 mg (06/05/2018), 0.25 mg (06/27/2018) leucovorin 800 mg in dextrose 5 % 250 mL infusion, 772 mg, Intravenous,  Once, 7 of 8 cycles Administration: 800 mg (03/14/2018), 800 mg (03/27/2018), 800 mg (05/02/2018), 800 mg (05/16/2018), 700 mg (04/18/2018), 800 mg (06/05/2018), 800 mg (06/27/2018) oxaliplatin (ELOXATIN) 165 mg in dextrose 5 % 500 mL chemo infusion, 85 mg/m2 = 165 mg, Intravenous,  Once, 6 of 7 cycles Dose modification: 68 mg/m2 (80 % of original dose 85 mg/m2, Cycle 4, Reason: Provider Judgment) Administration: 165 mg (03/14/2018), 165 mg (03/27/2018), 130 mg (05/02/2018), 130 mg (05/16/2018), 130 mg (06/05/2018), 130 mg (06/27/2018) panitumumab (VECTIBIX) 500 mg in sodium chloride 0.9 % 100 mL chemo infusion, 480 mg, Intravenous,  Once, 4 of 5 cycles Administration: 500 mg (04/18/2018), 480 mg (05/02/2018), 480 mg (05/16/2018), 480 mg (06/05/2018) fluorouracil (ADRUCIL) chemo injection 750 mg, 400 mg/m2 = 750 mg (100 % of original dose 400 mg/m2), Intravenous,  Once, 6 of 7 cycles Dose modification: 400 mg/m2 (original dose 400 mg/m2, Cycle 1), 400 mg/m2 (original dose 400 mg/m2, Cycle 3)  Administration: 750 mg (03/14/2018), 750 mg (04/18/2018), 750 mg (05/02/2018), 750 mg (05/16/2018), 750 mg (06/05/2018), 750 mg (06/27/2018) fosaprepitant (EMEND) 150 mg, dexamethasone (DECADRON) 12 mg in sodium chloride 0.9 % 145 mL IVPB, , Intravenous,  Once, 4 of 5 cycles Administration:  (05/02/2018),  (05/16/2018),  (06/05/2018),  (06/27/2018) fluorouracil (ADRUCIL) 4,650 mg in sodium chloride 0.9 % 57 mL chemo infusion, 2,400 mg/m2 = 4,650 mg, Intravenous, 1 Day/Dose, 7 of 8 cycles Administration: 4,650 mg (03/14/2018), 4,650 mg (03/27/2018), 4,650 mg (04/18/2018), 4,650 mg (05/02/2018), 4,650 mg (05/16/2018), 4,650 mg (06/05/2018), 4,650 mg (06/27/2018)  for chemotherapy treatment.       CANCER STAGING: Cancer Staging Malignant neoplasm of rectum [facility]) Staging form: Colon and Rectum, AJCC 8th Edition - Clinical stage from 03/13/2018: Stage IVA (cT3, cN1b, cM1a) - Signed by Derek Jack, MD on 03/13/2018    INTERVAL HISTORY:  Ms. Koran 53 y.o. female returns for routine follow-up.  She does not have any appetite.  She does have diarrhea which is stable.  Denies any nausea or vomiting.  Denies any hand-foot skin reaction.  Denies any chest pains or lightheadedness.  She felt somewhat depressed on Sunday.  She started back on Xarelto this morning, 3 pills.  She is finishing up radiation this Thursday.  She does not report any appetite.  Energy levels are 25%.  No fevers or infections noted.    REVIEW OF SYSTEMS:  Review of Systems  Constitutional: Positive for unexpected weight change.  Gastrointestinal: Positive for diarrhea.  Psychiatric/Behavioral: Positive for depression.  All other systems reviewed and  are negative.    PAST MEDICAL/SURGICAL HISTORY:  Past Medical History:  Diagnosis Date  . Anxiety   . Cancer [facility]) 2013   thyroid  . Endometrial polyp   . Endometrial thickening on ultra sound   . GERD (gastroesophageal reflux disease)   . 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 H/O Hashimoto thyroiditis   . 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 Hyperlipidemia 11/09/2015  . Hypothyroidism   . 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[place]) 02/26/2018   Past Surgical History:  Procedure Laterality Date  . BIOPSY  02/19/2018   Procedure: BIOPSY;  Surgeon: Rogene Houston, MD;  [facility];  Service: Endoscopy;;  rectum  . COLONOSCOPY N/A 08/20/2015   Procedure: COLONOSCOPY;  Surgeon: Rogene Houston, MD;  [facility];  Service: Endoscopy;  Laterality: N/A;  730  . [place] ENDOMETRIAL ABLATION  08-13-2004  . FLEXIBLE SIGMOIDOSCOPY N/A 02/19/2018   Procedure: FLEXIBLE SIGMOIDOSCOPY;  Surgeon: Rogene Houston, MD;  [facility];  Service: Endoscopy;  Laterality: N/A;  . HYSTEROSCOPY W/D&C N/A 04/30/2015   Procedure: DILATATION AND CURETTAGE / INTENDED HYSTEROSCOPY;  Surgeon: Dian Queen, MD;  Location: [facility];  Service: Gynecology;  Laterality: N/A;  . IR US GUIDE BX ASP/DRAIN  03/09/2018  . LAPAROSCOPIC CHOLECYSTECTOMY  04/1996  . POLYPECTOMY  08/20/2015   Procedure: POLYPECTOMY;  Surgeon: Rogene Houston, MD;  [facility];  Service: Endoscopy;;  Splenic flexure polypectomy  . POLYPECTOMY  02/19/2018   Procedure: POLYPECTOMY;  Surgeon: Rogene Houston, MD;  [facility];  Service: Endoscopy;;  rectum  . PORTACATH PLACEMENT N/A 03/14/2018   Procedure: INSERTION PORT-A-CATH;  Surgeon: Aviva Signs, MD;  Location: [facility];  Service: General;  Laterality: N/A;  . REDUCTION INCARCERATED UTERUS  06-20-2000   intrauterine preg. 13 wks /  urinary retention  . THYROID LOBECTOMY  11/24/2011   Procedure: THYROID LOBECTOMY;  Surgeon: Earnstine Regal, MD;  Location: [facility];  Service: General;  Laterality: Left;  Left Thyroid Lobectomy  . TUBAL LIGATION  2002      SOCIAL HISTORY:  Social History   Socioeconomic History  . Marital status: Married    Spouse name: Not on file  . Number of children: 4  . Years of education: Not on file  . Highest education level: Not on file  Occupational History    Comment: Marketing executive at [employer]  . Financial resource strain: Not hard at all  . Food insecurity:    Worry: Never true    Inability: Never true  . Transportation needs:    Medical: No    Non-medical: No  Tobacco Use  . Smoking status: Former Smoker    Packs/day: 0.25    Years: 10.00    Pack years: 2.50    Types: Cigarettes    Last attempt to quit: 10/31/1991    Years since quitting: 26.8  . Smokeless tobacco: Never Used  Substance and Sexual Activity  . Alcohol use: No  . Drug use: No  . Sexual activity: Not Currently  Lifestyle  . Physical activity:    Days per week: 0 days    Minutes per session: 0 min  . Stress: To some extent  Relationships  . Social connections:  Talks on phone: More than three times a week    Gets together: Twice a week    Attends religious service: More than 4 times per year    Active member of club or organization: Yes    Attends meetings of clubs or organizations: More than 4 times per year    Relationship status: Married  . Intimate partner violence:    Fear of current or ex partner: No    Emotionally abused: No    Physically abused: No    Forced sexual activity: No  Other Topics Concern  . Not on file  Social History Narrative  . Not on file    FAMILY HISTORY:  Family History  Problem Relation Age of Onset  . Hypertension Mother   . Hypertension Father   . Prostate cancer Father   . Cancer Maternal Aunt        spine/back  . Cancer Paternal Grandfather        lung  . Healthy Son   . Healthy Son   . Healthy Son   . Healthy Son   . Colon cancer Neg Hx     CURRENT MEDICATIONS:  Outpatient Encounter Medications as of 09/03/2018  Medication Sig  . ALPRAZolam (XANAX)  0.5 MG tablet Take 1 tablet (0.5 mg total) by mouth 2 (two) times daily as needed for anxiety.  . capecitabine (XELODA) 500 MG tablet Take 3 tablets (1,500 mg total) by mouth 2 (two) times daily after a meal. Take Monday through Friday throughout the course of radiation.  . lidocaine-prilocaine (EMLA) cream Apply small amount to port site one hour prior to appointment and cover with plastic wrap.  . loperamide (IMODIUM) 2 MG capsule Take 2 mg by mouth as needed for diarrhea or loose stools.  . ondansetron (ZOFRAN) 4 MG tablet Take 1 tablet (4 mg total) by mouth every 8 (eight) hours as needed for nausea or vomiting.  . potassium chloride SA (K-DUR) 20 MEQ tablet Take 1 tablet (20 mEq total) by mouth daily.  Marland Kitchen thyroid (ARMOUR) 90 MG tablet Take 90 mg by mouth daily before breakfast.    Facility-Administered Encounter Medications as of 09/03/2018  Medication  . sodium chloride 0.9 % 1,000 mL with potassium chloride 20 mEq, magnesium sulfate 2 g infusion    ALLERGIES:  I have reviewed her allergies.  PHYSICAL EXAM:  ECOG Performance status: 0  Vitals:   09/03/18 1000  BP: 116/72  Pulse: 85  Resp: 18  Temp: 98.6 F (37 C)  SpO2: 100%   Filed Weights   09/03/18 1000  Weight: 156 lb (70.8 kg)    Physical Exam Vitals signs reviewed.  Constitutional:      Appearance: Normal appearance.  Cardiovascular:     Rate and Rhythm: Normal rate and regular rhythm.     Heart sounds: Normal heart sounds.  Pulmonary:     Effort: Pulmonary effort is normal.     Breath sounds: Normal breath sounds.  Abdominal:     General: There is no distension.     Palpations: Abdomen is soft. There is no mass.  Musculoskeletal:        General: No swelling.  Skin:    General: Skin is warm.  Neurological:     General: No focal deficit present.     Mental Status: She is alert and oriented to person, place, and time.  Psychiatric:        Mood and Affect: Mood normal.      LABORATORY  DATA:  I have  reviewed the labs as listed.  CBC    Component Value Date/Time   WBC 5.0 09/03/2018 0916   RBC 3.05 (L) 09/03/2018 0916   HGB 10.8 (L) 09/03/2018 0916   HGB 13.5 11/03/2015 0841   HCT 31.0 (L) 09/03/2018 0916   HCT 39.7 11/03/2015 0841   PLT 123 (L) 09/03/2018 0916   PLT 165 11/03/2015 0841   MCV 101.6 (H) 09/03/2018 0916   MCV 95 11/03/2015 0841   MCH 35.4 (H) 09/03/2018 0916   MCHC 34.8 09/03/2018 0916   RDW 19.8 (H) 09/03/2018 0916   RDW 13.2 11/03/2015 0841   LYMPHSABS 0.4 (L) 09/03/2018 0916   LYMPHSABS 1.4 11/03/2015 0841   MONOABS 0.3 09/03/2018 0916   EOSABS 0.0 09/03/2018 0916   EOSABS 0.1 11/03/2015 0841   BASOSABS 0.0 09/03/2018 0916   BASOSABS 0.0 11/03/2015 0841   CMP Latest Ref Rng & Units 09/03/2018 08/27/2018 08/21/2018  Glucose 70 - 99 mg/dL 102(H) 97 101(H)  BUN 6 - 20 mg/dL '13 14 15  '$ Creatinine 0.44 - 1.00 mg/dL 0.75 0.64 0.66  Sodium 135 - 145 mmol/L 136 138 138  Potassium 3.5 - 5.1 mmol/L 3.3(L) 3.1(L) 3.3(L)  Chloride 98 - 111 mmol/L 102 103 105  CO2 22 - 32 mmol/L 21(L) 25 23  Calcium 8.9 - 10.3 mg/dL 9.3 9.1 9.0  Total Protein 6.5 - 8.1 g/dL 7.6 7.4 7.3  Total Bilirubin 0.3 - 1.2 mg/dL 5.9(H) 4.2(H) 3.7(H)  Alkaline Phos 38 - 126 U/L 91 99 108  AST 15 - 41 U/L '31 31 31  '$ ALT 0 - 44 U/L '19 18 20       '$ DIAGNOSTIC IMAGING:  I have independently reviewed the scans and discussed with the patient.   I have reviewed Venita Lick LPN's note and agree with the documentation.  I personally performed a face-to-face visit, made revisions and my assessment and plan is as follows.    ASSESSMENT & PLAN:   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[facility]) 1.  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 - Colonoscopy on 02/19/2018 shows rectal mass, 2 to 4  cm from the anal verge, normal sigmoid colon.  Biopsies consistent with adenocarcinoma. - MRI of the pelvis shows rectal adenocarcinoma, T stage-T3, N stage- N1-equivocal N2 nodes.  Maximum extension beyond muscularis propria is 6 mm (advanced T3).  Circumferential resection margin is 7 mm.  Right obturator node 6 mm.  Right external iliac node at 8 mm.  Craniocaudal extent of the tumor is 4.4 cm.  Approximately 180 degree circumferential.  Shortest distance from the distal tumor to anal sphincter is 2.1 cm.  Location from anal verge, 7.2 cm to the middle of the tumor. - CT CAP on 02/21/2018 shows 9 x 7 mm left lower lobe pulmonary nodule, new since 2009.  Possible subpleural left lower lobe 3 mm nodule versus area of pleural thickening.  Vague area of hypoattenuation in the posterior right hepatic lobe measuring 1.6 cm.  Differential includes benign/malignant lesion with altered perfusion.  Focal steatosis is possible. - MRI of the liver on 03/08/2018 shows 1.4 cm lesion in the posterior right hepatic lobe.  There is a 5 mm focus of delayed hyperenhancement in the lateral segment of the left liver, questionable for a second metastatic  deposit. -PET CT scan on 03/05/2018 shows hypermetabolic rectal mass.  Left perirectal hypermetabolic and sigmoid mesocolon lymph nodes.  Single hepatic metastatic lesion in the right hepatic lobe.  7 mm pulmonary nodule at the left lung base is suspicious for meta stasis. -Liver biopsy on 03/09/2018 consistent with adenocarcinoma.  - Cycle 1 FOLFOX on 03/14/2018.  She had constipation lasting for 5 days.  She also had cold sensitivity for few days.  She had some nausea but denied any vomiting. - Cycle 2 of FOLFOX on 03/27/2018. -She was admitted to the hospital from 03/27/2018 through 03/29/2018 with neutropenic fever and sepsis.  She received IV antibiotics. 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  Low  starting dose of Irinotecan is indicated. -Because of her elevated total bilirubin to above 5, cycle 3 was given on 04/18/2018 without oxaliplatin.  However vectibix was started. - She did have 1 day of severe weakness after last chemotherapy.  However she did very well without cold sensitivity. -She was evaluated by Dr. Marcille Buffy at transplant hepatology clinic at [facility].  They felt that Irinotecan should be avoided. - Oxaliplatin was dose reduced by 20% during cycle 4 on 05/02/2018. - 7 cycles of FOLFOX with vectibix completed on 06/27/2018.  Vectibix was left out during cycle 7.   - PET scan on 06/21/2018 showed interval resolution of the hypermetabolic area associated with right lobe of the liver metastasis.  Spleen has slightly enlarged to 13.4 cm in length and 12.9 cm previously.  Interval decrease in the radiotracer uptake associated with rectal neoplasm with SUV of 5 compared to 17.5 previously.  Previously noted FDG avid sigmoid mesocolon lymph nodes have resolved. - MRI of the liver on 06/25/2018 shows decrease in size of the liver metastasis to 5 mm, from 15 mm previously.  No new lesions noted. -Patient attended a multidisciplinary consultation with physicians at [facility] as a virtual visit. -She was recommended to have ablative therapy to the liver lesion.  She underwent ultrasound of her liver which did not show liver lesion.  Hence local treatment for liver lesion was put on hold at this time. - XRT with Xeloda 1500 mg twice daily Monday through Friday started on 07/30/2018. - I have reviewed her blood work.  Bilirubin increased to 5.9 today. -She had some diarrhea which is slightly worse since last week. -I will recheck her LFTs this Wednesday.  I will cut back on Xeloda to 2 tablets twice daily.  She is finishing radiation therapy on 09/06/2018. -She has an appointment to see Dr. Marcello Moores on 09/17/2018.  A CT CAP was set up by [facility] doctors on 09/27/2018. -I will reevaluate her in 1 week.    2.  Weight loss: -She lost 10 pounds since the beginning of treatment.  She lost 2 pounds since last week. -She does not have any appetite.  She is eating a few bites of food.  She is drinking anywhere between 2 and 4 boost plus daily.  She is also drinking 1 can of Ensure clear daily.  3.  Hypokalemia: -She is taking potassium 20 mEq daily, started last week. -Potassium today is 3.3. -I will increase potassium to 40 mEq daily.       Orders placed this encounter:  Orders Placed This Encounter  Procedures  . Hepatic function panel      Derek Jack, MD [place] [phone]

## 2018-09-03 NOTE — Assessment & Plan Note (Signed)
1.  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 - Colonoscopy on 02/19/2018 shows rectal mass, 2 to 4 cm from the anal verge, normal sigmoid colon.  Biopsies consistent with adenocarcinoma. - MRI of the pelvis shows rectal adenocarcinoma, T stage-T3, N stage- N1-equivocal N2 nodes.  Maximum extension beyond muscularis propria is 6 mm (advanced T3).  Circumferential resection margin is 7 mm.  Right obturator node 6 mm.  Right external iliac node at 8 mm.  Craniocaudal extent of the tumor is 4.4 cm.  Approximately 180 degree circumferential.  Shortest distance from the distal tumor to anal sphincter is 2.1 cm.  Location from anal verge, 7.2 cm to the middle of the tumor. - CT CAP on 02/21/2018 shows 9 x 7 mm left lower lobe pulmonary nodule, new since 2009.  Possible subpleural left lower lobe 3 mm nodule versus area of pleural thickening.  Vague area of hypoattenuation in the posterior right hepatic lobe measuring 1.6 cm.  Differential includes benign/malignant lesion with altered perfusion.  Focal steatosis is possible. - MRI of the liver on 03/08/2018 shows 1.4 cm lesion in the posterior right hepatic lobe.  There is a 5 mm focus of delayed hyperenhancement in the lateral segment of the left liver, questionable for a second metastatic deposit. -PET CT scan on 03/05/2018 shows hypermetabolic rectal mass.  Left perirectal hypermetabolic and sigmoid mesocolon lymph nodes.  Single hepatic metastatic lesion in the right hepatic lobe.  7 mm pulmonary nodule at the left lung base is suspicious for meta stasis. -Liver biopsy on 03/09/2018 consistent with adenocarcinoma.  - Cycle 1 FOLFOX on 03/14/2018.  She had constipation lasting for 5 days.  She also had cold sensitivity for few days.  She had some  nausea but denied any vomiting. - Cycle 2 of FOLFOX on 03/27/2018. -She was admitted to the hospital from 03/27/2018 through 03/29/2018 with neutropenic fever and sepsis.  She received IV antibiotics. 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  Low starting dose of Irinotecan is indicated. -Because of her elevated total bilirubin to above 5, cycle 3 was given on 04/18/2018 without oxaliplatin.  However vectibix was started. - She did have 1 day of severe weakness after last chemotherapy.  However she did very well without cold sensitivity. -She was evaluated by Dr. Marcille Buffy at transplant hepatology clinic at [facility].  They felt that Irinotecan should be avoided. - Oxaliplatin was dose reduced by 20% during cycle 4 on 05/02/2018. - 7 cycles of FOLFOX with vectibix completed on 06/27/2018.  Vectibix was left out during cycle 7.   - PET scan on 06/21/2018 showed interval resolution of the hypermetabolic area associated with right lobe of the liver metastasis.  Spleen has slightly enlarged to 13.4 cm in length and 12.9 cm previously.  Interval decrease in the radiotracer uptake associated with rectal neoplasm with SUV of 5 compared to 17.5 previously.  Previously noted FDG avid sigmoid mesocolon lymph nodes have resolved. - MRI of the liver on 06/25/2018 shows decrease in size of the liver metastasis to 5 mm, from 15 mm previously.  No new lesions noted. -Patient attended a multidisciplinary consultation with physicians at [facility] as a virtual visit. -She was recommended to have ablative therapy to  the liver lesion.  She underwent ultrasound of her liver which did not show liver lesion.  Hence local treatment for liver lesion was put on hold at this time. - XRT with Xeloda 1500 mg twice daily Monday through Friday started on 07/30/2018. - I have reviewed her blood work.  Bilirubin increased to 5.9 today. -She had some diarrhea which is  slightly worse since last week. -I will recheck her LFTs this Wednesday.  I will cut back on Xeloda to 2 tablets twice daily.  She is finishing radiation therapy on 09/06/2018. -She has an appointment to see Dr. Marcello Moores on 09/17/2018.  A CT CAP was set up by [facility] doctors on 09/27/2018. -I will reevaluate her in 1 week.   2.  Weight loss: -She lost 10 pounds since the beginning of treatment.  She lost 2 pounds since last week. -She does not have any appetite.  She is eating a few bites of food.  She is drinking anywhere between 2 and 4 boost plus daily.  She is also drinking 1 can of Ensure clear daily.  3.  Hypokalemia: -She is taking potassium 20 mEq daily, started last week. -Potassium today is 3.3. -I will increase potassium to 40 mEq daily.

## 2018-09-03 NOTE — Patient Instructions (Signed)
[facility] at [facility] Discharge Instructions  You were seen today by Dr. Katragadda. He went over your recent lab results. He will see you back in 1 week for labs and follow up.   Thank you for choosing [facility] at [facility] to provide your oncology and hematology care.  To afford each patient quality time with our provider, please arrive at least 15 minutes before your scheduled appointment time.   If you have a lab appointment with the Cancer Center please come in thru the  Main Entrance and check in at the main information desk  You need to re-schedule your appointment should you arrive 10 or more minutes late.  We strive to give you quality time with our providers, and arriving late affects you and other patients whose appointments are after yours.  Also, if you no show three or more times for appointments you may be dismissed from the clinic at the providers discretion.     Again, thank you for choosing [facility].  Our hope is that these requests will decrease the amount of time that you wait before being seen by our physicians.       _____________________________________________________________  Should you have questions after your visit to [facility], please contact our office at [phone] between the hours of 8:00 a.m. and 4:30 p.m.  Voicemails left after 4:00 p.m. will not be returned until the following business day.  For prescription refill requests, have your pharmacy contact our office and allow 72 hours.    Cancer Center Support Programs:   > Cancer Support Group  2nd Tuesday of the month 1pm-2pm, Journey Room    

## 2018-09-04 ENCOUNTER — Ambulatory Visit
Admission: RE | Admit: 2018-09-04 | Discharge: 2018-09-04 | Disposition: A | Discharge status: Home / Self Care | Payer: 59 | Source: Ambulatory Visit | Attending: Radiation Oncology | Admitting: Radiation Oncology

## 2018-09-04 ENCOUNTER — Other Ambulatory Visit: Payer: Self-pay

## 2018-09-04 ENCOUNTER — Encounter: Payer: Self-pay | Admitting: Family Medicine

## 2018-09-04 DIAGNOSIS — C20 Malignant neoplasm of rectum: Secondary | ICD-10-CM | POA: Diagnosis not present

## 2018-09-04 DIAGNOSIS — Z510 Encounter for antineoplastic radiation therapy: Secondary | ICD-10-CM | POA: Diagnosis not present

## 2018-09-04 LAB — CEA: CEA: 1.1 ng/mL (ref 0.0–4.7)

## 2018-09-05 ENCOUNTER — Telehealth (HOSPITAL_COMMUNITY): Payer: Self-pay | Admitting: *Deleted

## 2018-09-05 ENCOUNTER — Inpatient Hospital Stay (HOSPITAL_COMMUNITY): Payer: 59

## 2018-09-05 ENCOUNTER — Ambulatory Visit
Admission: RE | Admit: 2018-09-05 | Discharge: 2018-09-05 | Disposition: A | Discharge status: Home / Self Care | Payer: 59 | Source: Ambulatory Visit | Attending: Radiation Oncology | Admitting: Radiation Oncology

## 2018-09-05 ENCOUNTER — Other Ambulatory Visit: Payer: Self-pay

## 2018-09-05 DIAGNOSIS — Z510 Encounter for antineoplastic radiation therapy: Secondary | ICD-10-CM | POA: Diagnosis not present

## 2018-09-05 DIAGNOSIS — C20 Malignant neoplasm of rectum: Secondary | ICD-10-CM | POA: Diagnosis not present

## 2018-09-05 LAB — HEPATIC FUNCTION PANEL
ALT: 19 U/L (ref 0–44)
AST: 31 U/L (ref 15–41)
Albumin: 4.5 g/dL (ref 3.5–5.0)
Alkaline Phosphatase: 89 U/L (ref 38–126)
Bilirubin, Direct: 0.3 mg/dL — ABNORMAL HIGH (ref 0.0–0.2)
Indirect Bilirubin: 6.7 mg/dL — ABNORMAL HIGH (ref 0.3–0.9)
Total Bilirubin: 7 mg/dL — ABNORMAL HIGH (ref 0.3–1.2)
Total Protein: 7.1 g/dL (ref 6.5–8.1)

## 2018-09-05 MED ORDER — ALPRAZOLAM 0.5 MG PO TABS: 0.5000 mg | ORAL_TABLET | Freq: Two times a day (BID) | ORAL | 2 refills | Status: DC | PRN

## 2018-09-05 MED FILL — ALPRAZolam 0.5 MG TABS: 0.5 | 30 days supply | Qty: 60 | Fill #0

## 2018-09-05 NOTE — Telephone Encounter (Signed)
Pt called clinic to inquire about her hepatic panel lab results that were done today - her total bilirubin is noted to be elevated at 7.0, up from 5.9 two days ago.  Dr. Delton Coombes notified of results.  Advised pt to discontinue Xeloda per MD order. She verbalizes understanding.  Will RTC 09/10/18 for lab work and [place].

## 2018-09-06 ENCOUNTER — Encounter: Payer: Self-pay | Admitting: Radiation Oncology

## 2018-09-06 ENCOUNTER — Ambulatory Visit
Admission: RE | Admit: 2018-09-06 | Discharge: 2018-09-06 | Disposition: A | Discharge status: Home / Self Care | Payer: 59 | Source: Ambulatory Visit | Attending: Radiation Oncology | Admitting: Radiation Oncology

## 2018-09-06 ENCOUNTER — Other Ambulatory Visit: Payer: Self-pay

## 2018-09-06 DIAGNOSIS — C20 Malignant neoplasm of rectum: Secondary | ICD-10-CM | POA: Diagnosis not present

## 2018-09-06 DIAGNOSIS — Z510 Encounter for antineoplastic radiation therapy: Secondary | ICD-10-CM | POA: Diagnosis not present

## 2018-09-07 ENCOUNTER — Other Ambulatory Visit: Payer: Self-pay

## 2018-09-10 ENCOUNTER — Inpatient Hospital Stay (HOSPITAL_BASED_OUTPATIENT_CLINIC_OR_DEPARTMENT_OTHER): Payer: 59 | Admitting: Hematology

## 2018-09-10 ENCOUNTER — Encounter (HOSPITAL_COMMUNITY): Payer: Self-pay | Admitting: Hematology

## 2018-09-10 ENCOUNTER — Other Ambulatory Visit: Payer: Self-pay

## 2018-09-10 ENCOUNTER — Inpatient Hospital Stay (HOSPITAL_COMMUNITY): Payer: 59

## 2018-09-10 VITALS — BP 111/75 | HR 87 | Temp 99.1°F | Resp 18 | Wt 154.0 lb

## 2018-09-10 DIAGNOSIS — R634 Abnormal weight loss: Secondary | ICD-10-CM

## 2018-09-10 DIAGNOSIS — Z510 Encounter for antineoplastic radiation therapy: Secondary | ICD-10-CM | POA: Diagnosis not present

## 2018-09-10 DIAGNOSIS — C20 Malignant neoplasm of rectum: Secondary | ICD-10-CM

## 2018-09-10 DIAGNOSIS — E876 Hypokalemia: Secondary | ICD-10-CM

## 2018-09-10 DIAGNOSIS — C787 Secondary malignant neoplasm of liver and intrahepatic bile duct: Secondary | ICD-10-CM | POA: Diagnosis not present

## 2018-09-10 DIAGNOSIS — R110 Nausea: Secondary | ICD-10-CM | POA: Diagnosis not present

## 2018-09-10 DIAGNOSIS — R197 Diarrhea, unspecified: Secondary | ICD-10-CM | POA: Diagnosis not present

## 2018-09-10 LAB — CBC WITH DIFFERENTIAL/PLATELET
Abs Immature Granulocytes: 0.02 10*3/uL (ref 0.00–0.07)
Basophils Absolute: 0 10*3/uL (ref 0.0–0.1)
Basophils Relative: 0 %
Eosinophils Absolute: 0 10*3/uL (ref 0.0–0.5)
Eosinophils Relative: 1 %
HCT: 32 % — ABNORMAL LOW (ref 36.0–46.0)
Hemoglobin: 10.9 g/dL — ABNORMAL LOW (ref 12.0–15.0)
Immature Granulocytes: 1 %
Lymphocytes Relative: 11 %
Lymphs Abs: 0.4 10*3/uL — ABNORMAL LOW (ref 0.7–4.0)
MCH: 36 pg — ABNORMAL HIGH (ref 26.0–34.0)
MCHC: 34.1 g/dL (ref 30.0–36.0)
MCV: 105.6 fL — ABNORMAL HIGH (ref 80.0–100.0)
Monocytes Absolute: 0.2 10*3/uL (ref 0.1–1.0)
Monocytes Relative: 6 %
Neutro Abs: 3.2 10*3/uL (ref 1.7–7.7)
Neutrophils Relative %: 81 %
Platelets: 122 10*3/uL — ABNORMAL LOW (ref 150–400)
RBC: 3.03 MIL/uL — ABNORMAL LOW (ref 3.87–5.11)
RDW: 21.3 % — ABNORMAL HIGH (ref 11.5–15.5)
WBC: 3.9 10*3/uL — ABNORMAL LOW (ref 4.0–10.5)
nRBC: 0 % (ref 0.0–0.2)

## 2018-09-10 LAB — COMPREHENSIVE METABOLIC PANEL
ALT: 17 U/L (ref 0–44)
AST: 26 U/L (ref 15–41)
Albumin: 4.6 g/dL (ref 3.5–5.0)
Alkaline Phosphatase: 82 U/L (ref 38–126)
Anion gap: 14 (ref 5–15)
BUN: 10 mg/dL (ref 6–20)
CO2: 21 mmol/L — ABNORMAL LOW (ref 22–32)
Calcium: 9.6 mg/dL (ref 8.9–10.3)
Chloride: 105 mmol/L (ref 98–111)
Creatinine, Ser: 0.72 mg/dL (ref 0.44–1.00)
GFR calc Af Amer: >60 mL/min (ref >60)
GFR calc non Af Amer: >60 mL/min (ref >60)
Glucose, Bld: 96 mg/dL (ref 70–99)
Potassium: 3.5 mmol/L (ref 3.5–5.1)
Sodium: 140 mmol/L (ref 135–145)
Total Bilirubin: 6 mg/dL — ABNORMAL HIGH (ref 0.3–1.2)
Total Protein: 7.6 g/dL (ref 6.5–8.1)

## 2018-09-10 LAB — MAGNESIUM: Magnesium: 2.4 mg/dL (ref 1.7–2.4)

## 2018-09-10 NOTE — Patient Instructions (Signed)
[facility] at [facility] Discharge Instructions  You were seen today by Dr. Katragadda. He went over your recent lab results. He will see you back in 2 weeks for labs and follow up.   Thank you for choosing [facility] at [facility] to provide your oncology and hematology care.  To afford each patient quality time with our provider, please arrive at least 15 minutes before your scheduled appointment time.   If you have a lab appointment with the Cancer Center please come in thru the  Main Entrance and check in at the main information desk  You need to re-schedule your appointment should you arrive 10 or more minutes late.  We strive to give you quality time with our providers, and arriving late affects you and other patients whose appointments are after yours.  Also, if you no show three or more times for appointments you may be dismissed from the clinic at the providers discretion.     Again, thank you for choosing [facility].  Our hope is that these requests will decrease the amount of time that you wait before being seen by our physicians.       _____________________________________________________________  Should you have questions after your visit to [facility], please contact our office at [phone] between the hours of 8:00 a.m. and 4:30 p.m.  Voicemails left after 4:00 p.m. will not be returned until the following business day.  For prescription refill requests, have your pharmacy contact our office and allow 72 hours.    Cancer Center Support Programs:   > Cancer Support Group  2nd Tuesday of the month 1pm-2pm, Journey Room    

## 2018-09-10 NOTE — Assessment & Plan Note (Addendum)
1.  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 - Colonoscopy on 02/19/2018 shows rectal mass, 2 to 4 cm from the anal verge, normal sigmoid colon.  Biopsies consistent with adenocarcinoma. - MRI of the pelvis shows rectal adenocarcinoma, T stage-T3, N stage- N1-equivocal N2 nodes.  Maximum extension beyond muscularis propria is 6 mm (advanced T3).  Circumferential resection margin is 7 mm.  Right obturator node 6 mm.  Right external iliac node at 8 mm.  Craniocaudal extent of the tumor is 4.4 cm.  Approximately 180 degree circumferential.  Shortest distance from the distal tumor to anal sphincter is 2.1 cm.  Location from anal verge, 7.2 cm to the middle of the tumor. - CT CAP on 02/21/2018 shows 9 x 7 mm left lower lobe pulmonary nodule, new since 2009.  Possible subpleural left lower lobe 3 mm nodule versus area of pleural thickening.  Vague area of hypoattenuation in the posterior right hepatic lobe measuring 1.6 cm.  Differential includes benign/malignant lesion with altered perfusion.  Focal steatosis is possible. - MRI of the liver on 03/08/2018 shows 1.4 cm lesion in the posterior right hepatic lobe.  There is a 5 mm focus of delayed hyperenhancement in the lateral segment of the left liver, questionable for a second metastatic deposit. -PET CT scan on 03/05/2018 shows hypermetabolic rectal mass.  Left perirectal hypermetabolic and sigmoid mesocolon lymph nodes.  Single hepatic metastatic lesion in the right hepatic lobe.  7 mm pulmonary nodule at the left lung base is suspicious for meta stasis. -Liver biopsy on 03/09/2018 consistent with adenocarcinoma.  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  Low starting dose  of Irinotecan is indicated. - 7 cycles of FOLFOX with vectibix completed on 06/27/2018.  Vectibix was left out during cycle 7.   - PET scan on 06/21/2018 showed interval resolution of the hypermetabolic area associated with right lobe of the liver metastasis.  Spleen has slightly enlarged to 13.4 cm in length and 12.9 cm previously.  Interval decrease in the radiotracer uptake associated with rectal neoplasm with SUV of 5 compared to 17.5 previously.  Previously noted FDG avid sigmoid mesocolon lymph nodes have resolved. - MRI of the liver on 06/25/2018 shows decrease in size of the liver metastasis to 5 mm, from 15 mm previously.  No new lesions noted. -Patient attended a multidisciplinary consultation with physicians at [facility] as a virtual visit. -She was recommended to have ablative therapy to the liver lesion.  She underwent ultrasound of her liver which did not show liver lesion.  Hence local treatment for liver lesion was put on hold at this time. - XRT with Xeloda 1500 mg twice daily Monday through Friday started on 07/30/2018. - She completed radiation therapy on 09/06/2018.  We have stopped her Xeloda on 09/05/2018 secondary to elevated bilirubin. -She has an appointment to see Dr. Marcello Moores on 09/17/2018.  A CT CAP was set up by [facility] doctors on 09/27/2018. - She has mild irritation with skin breakdown at the rectal area.  She also complains of vaginal dryness. - I have reviewed her blood work.  Bilirubin improved to 6.  Mild leukopenia and thrombocytopenia from therapy.  Diarrhea and nausea has also improved slightly. -She reports that her  eating has improved slightly although she lost couple of pounds. - I will reevaluate her in 2 weeks.  2.  Weight loss: -She lost about 7 pounds since the beginning of treatment.  She lost 2 pounds since last week. -She reports eating has improved slightly.  She is drinking about 2 cans of boost per day and 1 can of Ensure juice.  3.  Hypokalemia: -Potassium is 3.5  today.  She will continue potassium 40 mEq daily.

## 2018-09-10 NOTE — Progress Notes (Signed)
[place], [place] [address]   CLINIC:  Medical Oncology/Hematology  PCP:  [name], MD [address] [place] [address] [phone]   REASON FOR VISIT:  Follow-up for rectal cancer   BRIEF ONCOLOGIC HISTORY:  Oncology History  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[place])  02/26/2018 Initial Diagnosis   Rectal cancer ([place])   03/13/2018 Cancer Staging   Staging form: Colon and Rectum, AJCC 8th Edition - Clinical stage from 03/13/2018: Stage IVA (cT3, cN1b, cM1a) - Signed by Derek Jack, MD on 03/13/2018   03/14/2018 -  Chemotherapy   The patient had palonosetron (ALOXI) injection 0.25 mg, 0.25 mg, Intravenous,  Once, 7 of 8 cycles Administration: 0.25 mg (03/14/2018), 0.25 mg (03/27/2018), 0.25 mg (04/18/2018), 0.25 mg (05/02/2018), 0.25 mg (05/16/2018), 0.25 mg (06/05/2018), 0.25 mg (06/27/2018) leucovorin 800 mg in dextrose 5 % 250 mL infusion, 772 mg, Intravenous,  Once, 7 of 8 cycles Administration: 800 mg (03/14/2018), 800 mg (03/27/2018), 800 mg (05/02/2018), 800 mg (05/16/2018), 700 mg (04/18/2018), 800 mg (06/05/2018), 800 mg (06/27/2018) oxaliplatin (ELOXATIN) 165 mg in dextrose 5 % 500 mL chemo infusion, 85 mg/m2 = 165 mg, Intravenous,  Once, 6 of 7 cycles Dose modification: 68 mg/m2 (80 % of original dose 85 mg/m2, Cycle 4, Reason: Provider Judgment) Administration: 165 mg (03/14/2018), 165 mg (03/27/2018), 130 mg (05/02/2018), 130 mg (05/16/2018), 130 mg (06/05/2018), 130 mg (06/27/2018) panitumumab (VECTIBIX) 500 mg in sodium chloride 0.9 % 100 mL chemo infusion, 480 mg, Intravenous,  Once, 4 of 5 cycles Administration: 500 mg (04/18/2018), 480 mg (05/02/2018), 480 mg (05/16/2018), 480 mg (06/05/2018) fluorouracil (ADRUCIL) chemo injection 750 mg, 400 mg/m2 = 750 mg (100 % of original dose 400 mg/m2), Intravenous,  Once, 6 of 7 cycles Dose modification: 400 mg/m2 (original dose 400 mg/m2, Cycle 1), 400 mg/m2 (original dose 400 mg/m2,  Cycle 3) Administration: 750 mg (03/14/2018), 750 mg (04/18/2018), 750 mg (05/02/2018), 750 mg (05/16/2018), 750 mg (06/05/2018), 750 mg (06/27/2018) fosaprepitant (EMEND) 150 mg, dexamethasone (DECADRON) 12 mg in sodium chloride 0.9 % 145 mL IVPB, , Intravenous,  Once, 4 of 5 cycles Administration:  (05/02/2018),  (05/16/2018),  (06/05/2018),  (06/27/2018) fluorouracil (ADRUCIL) 4,650 mg in sodium chloride 0.9 % 57 mL chemo infusion, 2,400 mg/m2 = 4,650 mg, Intravenous, 1 Day/Dose, 7 of 8 cycles Administration: 4,650 mg (03/14/2018), 4,650 mg (03/27/2018), 4,650 mg (04/18/2018), 4,650 mg (05/02/2018), 4,650 mg (05/16/2018), 4,650 mg (06/05/2018), 4,650 mg (06/27/2018)  for chemotherapy treatment.       CANCER STAGING: Cancer Staging Malignant neoplasm of rectum [facility]) Staging form: Colon and Rectum, AJCC 8th Edition - Clinical stage from 03/13/2018: Stage IVA (cT3, cN1b, cM1a) - Signed by Derek Jack, MD on 03/13/2018    INTERVAL HISTORY:  Ms. Dimaano 53 y.o. female seen for follow-up.  She finished radiation on 09/06/2018.  She reports that her diarrhea is slightly getting better along with nausea.  She is taking Compazine in the evenings.  Fatigue is also slightly better.  She has some discomfort and pain in the rectal area where there is skin breakdown.  Appetite is 25%.  Energy levels are 50%.  She lost about 2 pounds from last visit.  She thinks eating has improved slightly over the weekend.  She is drinking about 2 cans of boost per day and 1 can of Ensure or juice daily.  Denies any fevers or infections.    REVIEW OF SYSTEMS:  Review of Systems  Constitutional: Positive for  fatigue. Negative for unexpected weight change.  Gastrointestinal: Positive for diarrhea and nausea.  Psychiatric/Behavioral: Negative for depression.  All other systems reviewed and are negative.    PAST MEDICAL/SURGICAL HISTORY:  Past Medical History:  Diagnosis Date  . Anxiety   . Cancer [facility]) 2013   thyroid  .  Endometrial polyp   . Endometrial thickening on ultra sound   . GERD (gastroesophageal reflux disease)   . 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 H/O Hashimoto thyroiditis   . 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 Hyperlipidemia 11/09/2015  . Hypothyroidism   . 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[place]) 02/26/2018   Past Surgical History:  Procedure Laterality Date  . BIOPSY  02/19/2018   Procedure: BIOPSY;  Surgeon: Rogene Houston, MD;  [facility];  Service: Endoscopy;;  rectum  . COLONOSCOPY N/A 08/20/2015   Procedure: COLONOSCOPY;  Surgeon: Rogene Houston, MD;  [facility];  Service: Endoscopy;  Laterality: N/A;  730  . [place] ENDOMETRIAL ABLATION  08-13-2004  . FLEXIBLE SIGMOIDOSCOPY N/A 02/19/2018   Procedure: FLEXIBLE SIGMOIDOSCOPY;  Surgeon: Rogene Houston, MD;  [facility];  Service: Endoscopy;  Laterality: N/A;  . HYSTEROSCOPY W/D&C N/A 04/30/2015   Procedure: DILATATION AND CURETTAGE / INTENDED HYSTEROSCOPY;  Surgeon: Dian Queen, MD;  Location: [facility];  Service: Gynecology;  Laterality: N/A;  . IR US GUIDE BX ASP/DRAIN  03/09/2018  . LAPAROSCOPIC CHOLECYSTECTOMY  04/1996  . POLYPECTOMY  08/20/2015   Procedure: POLYPECTOMY;  Surgeon: Rogene Houston, MD;  [facility];  Service: Endoscopy;;  Splenic flexure polypectomy  . POLYPECTOMY  02/19/2018   Procedure: POLYPECTOMY;  Surgeon: Rogene Houston, MD;  [facility];  Service: Endoscopy;;  rectum  . PORTACATH PLACEMENT N/A 03/14/2018   Procedure: INSERTION PORT-A-CATH;  Surgeon: Aviva Signs, MD;  Location: [facility];  Service: General;  Laterality: N/A;  . REDUCTION INCARCERATED UTERUS  06-20-2000   intrauterine preg. 13 wks /  urinary retention  . THYROID LOBECTOMY  11/24/2011   Procedure:  THYROID LOBECTOMY;  Surgeon: Earnstine Regal, MD;  Location: [facility];  Service: General;  Laterality: Left;  Left Thyroid Lobectomy  . TUBAL LIGATION  2002     SOCIAL HISTORY:  Social History   Socioeconomic History  . Marital status: Married    Spouse name: Not on file  . Number of children: 4  . Years of education: Not on file  . Highest education level: Not on file  Occupational History    Comment: Marketing executive at [employer]  . Financial resource strain: Not hard at all  . Food insecurity    Worry: Never true    Inability: Never true  . Transportation needs    Medical: No    Non-medical: No  Tobacco Use  . Smoking status: Former Smoker    Packs/day: 0.25    Years: 10.00    Pack years: 2.50    Types: Cigarettes    Quit date: 10/31/1991    Years since quitting: 26.8  . Smokeless tobacco: Never Used  Substance and Sexual Activity  . Alcohol use: No  . Drug use: No  . Sexual activity: Not Currently  Lifestyle  . Physical activity    Days per week: 0 days  Minutes per session: 0 min  . Stress: To some extent  Relationships  . Social connections    Talks on phone: More than three times a week    Gets together: Twice a week    Attends religious service: More than 4 times per year    Active member of club or organization: Yes    Attends meetings of clubs or organizations: More than 4 times per year    Relationship status: Married  . Intimate partner violence    Fear of current or ex partner: No    Emotionally abused: No    Physically abused: No    Forced sexual activity: No  Other Topics Concern  . Not on file  Social History Narrative  . Not on file    FAMILY HISTORY:  Family History  Problem Relation Age of Onset  . Hypertension Mother   . Hypertension Father   . Prostate cancer Father   . Cancer Maternal Aunt        spine/back  . Cancer Paternal Grandfather        lung  . Healthy Son   . Healthy Son   . Healthy Son   . Healthy Son   .  Colon cancer Neg Hx     CURRENT MEDICATIONS:  Outpatient Encounter Medications as of 09/10/2018  Medication Sig  . ALPRAZolam (XANAX) 0.5 MG tablet Take 1 tablet (0.5 mg total) by mouth 2 (two) times daily as needed for anxiety.  . Hydrocortisone-Aloe 0.5 % CREA Apply topically.  . lidocaine-prilocaine (EMLA) cream Apply small amount to port site one hour prior to appointment and cover with plastic wrap.  . loperamide (IMODIUM) 2 MG capsule Take 2 mg by mouth as needed for diarrhea or loose stools.  . ondansetron (ZOFRAN) 4 MG tablet Take 1 tablet (4 mg total) by mouth every 8 (eight) hours as needed for nausea or vomiting.  . potassium chloride SA (K-DUR) 20 MEQ tablet Take 1 tablet (20 mEq total) by mouth daily.  Marland Kitchen thyroid (ARMOUR) 90 MG tablet Take 90 mg by mouth daily before breakfast.   . capecitabine (XELODA) 500 MG tablet Take 3 tablets (1,500 mg total) by mouth 2 (two) times daily after a meal. Take Monday through Friday throughout the course of radiation. (Patient not taking: Reported on 09/10/2018)   Facility-Administered Encounter Medications as of 09/10/2018  Medication  . sodium chloride 0.9 % 1,000 mL with potassium chloride 20 mEq, magnesium sulfate 2 g infusion    ALLERGIES:  I have reviewed her allergies.  PHYSICAL EXAM:  ECOG Performance status: 0  Vitals:   09/10/18 1135  BP: 111/75  Pulse: 87  Resp: 18  Temp: 99.1 F (37.3 C)  SpO2: 100%   Filed Weights   09/10/18 1135  Weight: 154 lb (69.9 kg)    Physical Exam Vitals signs reviewed.  Constitutional:      Appearance: Normal appearance.  Cardiovascular:     Rate and Rhythm: Normal rate and regular rhythm.     Heart sounds: Normal heart sounds.  Pulmonary:     Effort: Pulmonary effort is normal.     Breath sounds: Normal breath sounds.  Abdominal:     General: There is no distension.     Palpations: Abdomen is soft. There is no mass.  Musculoskeletal:        General: No swelling.  Skin:     General: Skin is warm.  Neurological:     General: No focal deficit present.  Mental Status: She is alert and oriented to person, place, and time.  Psychiatric:        Mood and Affect: Mood normal.      LABORATORY DATA:  I have reviewed the labs as listed.  CBC    Component Value Date/Time   WBC 3.9 (L) 09/10/2018 0907   RBC 3.03 (L) 09/10/2018 0907   HGB 10.9 (L) 09/10/2018 0907   HGB 13.5 11/03/2015 0841   HCT 32.0 (L) 09/10/2018 0907   HCT 39.7 11/03/2015 0841   PLT 122 (L) 09/10/2018 0907   PLT 165 11/03/2015 0841   MCV 105.6 (H) 09/10/2018 0907   MCV 95 11/03/2015 0841   MCH 36.0 (H) 09/10/2018 0907   MCHC 34.1 09/10/2018 0907   RDW 21.3 (H) 09/10/2018 0907   RDW 13.2 11/03/2015 0841   LYMPHSABS 0.4 (L) 09/10/2018 0907   LYMPHSABS 1.4 11/03/2015 0841   MONOABS 0.2 09/10/2018 0907   EOSABS 0.0 09/10/2018 0907   EOSABS 0.1 11/03/2015 0841   BASOSABS 0.0 09/10/2018 0907   BASOSABS 0.0 11/03/2015 0841   CMP Latest Ref Rng & Units 09/10/2018 09/05/2018 09/03/2018  Glucose 70 - 99 mg/dL 96 - 102(H)  BUN 6 - 20 mg/dL 10 - 13  Creatinine 0.44 - 1.00 mg/dL 0.72 - 0.75  Sodium 135 - 145 mmol/L 140 - 136  Potassium 3.5 - 5.1 mmol/L 3.5 - 3.3(L)  Chloride 98 - 111 mmol/L 105 - 102  CO2 22 - 32 mmol/L 21(L) - 21(L)  Calcium 8.9 - 10.3 mg/dL 9.6 - 9.3  Total Protein 6.5 - 8.1 g/dL 7.6 7.1 7.6  Total Bilirubin 0.3 - 1.2 mg/dL 6.0(H) 7.0(H) 5.9(H)  Alkaline Phos 38 - 126 U/L 82 89 91  AST 15 - 41 U/L _0 ALT 0 - 44 U/L _1 DIAGNOSTIC IMAGING:  I have independently reviewed the scans and discussed with the patient.   I have reviewed Venita Lick LPN's note and agree with the documentation.  I personally performed a face-to-face visit, made revisions and my assessment and plan is as follows.    ASSESSMENT & PLAN:   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[facility]) 1.  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 - Colonoscopy on 02/19/2018 shows rectal mass, 2 to 4 cm from the anal verge, normal sigmoid colon.  Biopsies consistent with adenocarcinoma. - MRI of the pelvis shows rectal adenocarcinoma, T stage-T3, N stage- N1-equivocal N2 nodes.  Maximum extension beyond muscularis propria is 6 mm (advanced T3).  Circumferential resection margin is 7 mm.  Right obturator node 6 mm.  Right external iliac node at 8 mm.  Craniocaudal extent of the tumor is 4.4 cm.  Approximately 180 degree circumferential.  Shortest distance from the distal tumor to anal sphincter is 2.1 cm.  Location from anal verge, 7.2 cm to the middle of the tumor. - CT CAP on 02/21/2018 shows 9 x 7 mm left lower lobe pulmonary nodule, new since 2009.  Possible subpleural left lower lobe 3 mm nodule versus area of pleural thickening.  Vague area of hypoattenuation in the posterior right hepatic lobe measuring 1.6 cm.  Differential includes benign/malignant lesion with altered perfusion.  Focal steatosis is possible. - MRI of the liver on 03/08/2018 shows  1.4 cm lesion in the posterior right hepatic lobe.  There is a 5 mm focus of delayed hyperenhancement in the lateral segment of the left liver, questionable for a second metastatic deposit. -PET CT scan on 03/05/2018 shows hypermetabolic rectal mass.  Left perirectal hypermetabolic and sigmoid mesocolon lymph nodes.  Single hepatic metastatic lesion in the right hepatic lobe.  7 mm pulmonary nodule at the left lung base is suspicious for meta stasis. -Liver biopsy on 03/09/2018 consistent with adenocarcinoma.  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  Low starting dose of Irinotecan is indicated. - 7 cycles of FOLFOX with vectibix completed on 06/27/2018.   Vectibix was left out during cycle 7.   - PET scan on 06/21/2018 showed interval resolution of the hypermetabolic area associated with right lobe of the liver metastasis.  Spleen has slightly enlarged to 13.4 cm in length and 12.9 cm previously.  Interval decrease in the radiotracer uptake associated with rectal neoplasm with SUV of 5 compared to 17.5 previously.  Previously noted FDG avid sigmoid mesocolon lymph nodes have resolved. - MRI of the liver on 06/25/2018 shows decrease in size of the liver metastasis to 5 mm, from 15 mm previously.  No new lesions noted. -Patient attended a multidisciplinary consultation with physicians at [facility] as a virtual visit. -She was recommended to have ablative therapy to the liver lesion.  She underwent ultrasound of her liver which did not show liver lesion.  Hence local treatment for liver lesion was put on hold at this time. - XRT with Xeloda 1500 mg twice daily Monday through Friday started on 07/30/2018. - She completed radiation therapy on 09/06/2018.  We have stopped her Xeloda on 09/05/2018 secondary to elevated bilirubin. -She has an appointment to see Dr. Marcello Moores on 09/17/2018.  A CT CAP was set up by [facility] doctors on 09/27/2018. - She has mild irritation with skin breakdown at the rectal area.  She also complains of vaginal dryness. - I have reviewed her blood work.  Bilirubin improved to 6.  Mild leukopenia and thrombocytopenia from therapy.  Diarrhea and nausea has also improved slightly. -She reports that her eating has improved slightly although she lost couple of pounds. - I will reevaluate her in 2 weeks.  2.  Weight loss: -She lost about 7 pounds since the beginning of treatment.  She lost 2 pounds since last week. -She reports eating has improved slightly.  She is drinking about 2 cans of boost per day and 1 can of Ensure juice.  3.  Hypokalemia: -Potassium is 3.5 today.  She will continue potassium 40 mEq daily.    Total time spent is 25 minutes  with more than 50% of the time spent face-to-face discussing lab work, treatment plan and coordination of care.  Orders placed this encounter:  Orders Placed This Encounter  Procedures  . CBC with Differential/Platelet  . Comprehensive metabolic panel      Derek Jack, MD [place] [phone]

## 2018-09-14 ENCOUNTER — Other Ambulatory Visit (HOSPITAL_COMMUNITY): Payer: Self-pay | Admitting: *Deleted

## 2018-09-14 MED ORDER — POTASSIUM CHLORIDE CRYS ER 20 MEQ PO TBCR: 20.0000 meq | EXTENDED_RELEASE_TABLET | Freq: Two times a day (BID) | ORAL | 2 refills | Status: DC

## 2018-09-14 MED FILL — POTASSIUM CHLORIDE CRYS ER: 20 | 15 days supply | Qty: 30 | Fill #0

## 2018-09-17 ENCOUNTER — Ambulatory Visit: Payer: Self-pay | Admitting: General Surgery

## 2018-09-17 DIAGNOSIS — C20 Malignant neoplasm of rectum: Secondary | ICD-10-CM | POA: Diagnosis not present

## 2018-09-17 MED ORDER — GENTAMICIN SULFATE 40 MG/ML IJ SOLN: 5.0000 mg/kg | INTRAVENOUS | Status: AC

## 2018-09-17 MED ORDER — DEXTROSE 5 % IV SOLN: 900.0000 mg | INTRAVENOUS | Status: AC

## 2018-09-17 MED FILL — NEOMYCIN 500 MG TABLET: 500 | 1 days supply | Qty: 6 | Fill #0

## 2018-09-17 MED FILL — metroNIDAZOLE 500 MG TABS: 500 | 1 days supply | Qty: 6 | Fill #0

## 2018-09-24 ENCOUNTER — Inpatient Hospital Stay (HOSPITAL_COMMUNITY): Payer: 59

## 2018-09-24 ENCOUNTER — Other Ambulatory Visit: Payer: Self-pay

## 2018-09-24 DIAGNOSIS — C20 Malignant neoplasm of rectum: Secondary | ICD-10-CM | POA: Diagnosis not present

## 2018-09-24 DIAGNOSIS — Z510 Encounter for antineoplastic radiation therapy: Secondary | ICD-10-CM | POA: Diagnosis not present

## 2018-09-24 LAB — CBC WITH DIFFERENTIAL/PLATELET
Abs Immature Granulocytes: 0.02 10*3/uL (ref 0.00–0.07)
Basophils Absolute: 0 10*3/uL (ref 0.0–0.1)
Basophils Relative: 0 %
Eosinophils Absolute: 0 10*3/uL (ref 0.0–0.5)
Eosinophils Relative: 0 %
HCT: 33.8 % — ABNORMAL LOW (ref 36.0–46.0)
Hemoglobin: 11.6 g/dL — ABNORMAL LOW (ref 12.0–15.0)
Immature Granulocytes: 1 %
Lymphocytes Relative: 13 %
Lymphs Abs: 0.4 10*3/uL — ABNORMAL LOW (ref 0.7–4.0)
MCH: 35.5 pg — ABNORMAL HIGH (ref 26.0–34.0)
MCHC: 34.3 g/dL (ref 30.0–36.0)
MCV: 103.4 fL — ABNORMAL HIGH (ref 80.0–100.0)
Monocytes Absolute: 0.3 10*3/uL (ref 0.1–1.0)
Monocytes Relative: 8 %
Neutro Abs: 2.6 10*3/uL (ref 1.7–7.7)
Neutrophils Relative %: 78 %
Platelets: 101 10*3/uL — ABNORMAL LOW (ref 150–400)
RBC: 3.27 MIL/uL — ABNORMAL LOW (ref 3.87–5.11)
RDW: 15.2 % (ref 11.5–15.5)
WBC: 3.3 10*3/uL — ABNORMAL LOW (ref 4.0–10.5)
nRBC: 0 % (ref 0.0–0.2)

## 2018-09-24 LAB — COMPREHENSIVE METABOLIC PANEL
ALT: 22 U/L (ref 0–44)
AST: 32 U/L (ref 15–41)
Albumin: 4.9 g/dL (ref 3.5–5.0)
Alkaline Phosphatase: 98 U/L (ref 38–126)
Anion gap: 10 (ref 5–15)
BUN: 11 mg/dL (ref 6–20)
CO2: 25 mmol/L (ref 22–32)
Calcium: 9.7 mg/dL (ref 8.9–10.3)
Chloride: 103 mmol/L (ref 98–111)
Creatinine, Ser: 0.65 mg/dL (ref 0.44–1.00)
GFR calc Af Amer: >60 mL/min (ref >60)
GFR calc non Af Amer: >60 mL/min (ref >60)
Glucose, Bld: 100 mg/dL — ABNORMAL HIGH (ref 70–99)
Potassium: 3.8 mmol/L (ref 3.5–5.1)
Sodium: 138 mmol/L (ref 135–145)
Total Bilirubin: 6.7 mg/dL — ABNORMAL HIGH (ref 0.3–1.2)
Total Protein: 7.9 g/dL (ref 6.5–8.1)

## 2018-09-25 ENCOUNTER — Other Ambulatory Visit: Payer: Self-pay

## 2018-09-25 ENCOUNTER — Encounter (HOSPITAL_COMMUNITY): Payer: Self-pay | Admitting: Hematology

## 2018-09-25 ENCOUNTER — Inpatient Hospital Stay (HOSPITAL_COMMUNITY): Payer: 59

## 2018-09-25 ENCOUNTER — Inpatient Hospital Stay (HOSPITAL_BASED_OUTPATIENT_CLINIC_OR_DEPARTMENT_OTHER): Payer: 59 | Admitting: Hematology

## 2018-09-25 DIAGNOSIS — C20 Malignant neoplasm of rectum: Secondary | ICD-10-CM | POA: Diagnosis not present

## 2018-09-25 DIAGNOSIS — Z510 Encounter for antineoplastic radiation therapy: Secondary | ICD-10-CM | POA: Diagnosis not present

## 2018-09-25 LAB — CEA: CEA: 0.9 ng/mL (ref 0.0–4.7)

## 2018-09-25 MED ORDER — HEPARIN SOD (PORK) LOCK FLUSH 100 UNIT/ML IV SOLN
500.0000 [IU] | Freq: Once | INTRAVENOUS | Status: AC | PRN
  Administered 2018-09-25: 500 [IU]

## 2018-09-25 MED ORDER — SODIUM CHLORIDE 0.9% FLUSH
10.0000 mL | INTRAVENOUS | Status: DC | PRN
  Administered 2018-09-25: 10 mL
  Filled 2018-09-25: qty 10

## 2018-09-25 NOTE — Patient Instructions (Addendum)
[facility] at [facility] Discharge Instructions  You were seen today by Dr. Katragadda. He went over your recent lab results. He will see you back in 3-4 weeks for labs and follow up.   Thank you for choosing [facility] at [facility] to provide your oncology and hematology care.  To afford each patient quality time with our provider, please arrive at least 15 minutes before your scheduled appointment time.   If you have a lab appointment with the Cancer Center please come in thru the  Main Entrance and check in at the main information desk  You need to re-schedule your appointment should you arrive 10 or more minutes late.  We strive to give you quality time with our providers, and arriving late affects you and other patients whose appointments are after yours.  Also, if you no show three or more times for appointments you may be dismissed from the clinic at the providers discretion.     Again, thank you for choosing [facility].  Our hope is that these requests will decrease the amount of time that you wait before being seen by our physicians.       _____________________________________________________________  Should you have questions after your visit to [facility], please contact our office at [phone] between the hours of 8:00 a.m. and 4:30 p.m.  Voicemails left after 4:00 p.m. will not be returned until the following business day.  For prescription refill requests, have your pharmacy contact our office and allow 72 hours.    Cancer Center Support Programs:   > Cancer Support Group  2nd Tuesday of the month 1pm-2pm, Journey Room    

## 2018-09-25 NOTE — Assessment & Plan Note (Addendum)
1.  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 - Colonoscopy on 02/19/2018 shows rectal mass, 2 to 4 cm from the anal verge, normal sigmoid colon.  Biopsies consistent with adenocarcinoma. - MRI of the pelvis shows rectal adenocarcinoma, T stage-T3, N stage- N1-equivocal N2 nodes.  Maximum extension beyond muscularis propria is 6 mm (advanced T3).  Circumferential resection margin is 7 mm.  Right obturator node 6 mm.  Right external iliac node at 8 mm.  Craniocaudal extent of the tumor is 4.4 cm.  Approximately 180 degree circumferential.  Shortest distance from the distal tumor to anal sphincter is 2.1 cm.  Location from anal verge, 7.2 cm to the middle of the tumor. - CT CAP on 02/21/2018 shows 9 x 7 mm left lower lobe pulmonary nodule, new since 2009.  Possible subpleural left lower lobe 3 mm nodule versus area of pleural thickening.  Vague area of hypoattenuation in the posterior right hepatic lobe measuring 1.6 cm.  Differential includes benign/malignant lesion with altered perfusion.  Focal steatosis is possible. - MRI of the liver on 03/08/2018 shows 1.4 cm lesion in the posterior right hepatic lobe.  There is a 5 mm focus of delayed hyperenhancement in the lateral segment of the left liver, questionable for a second metastatic deposit. -PET CT scan on 03/05/2018 shows hypermetabolic rectal mass.  Left perirectal hypermetabolic and sigmoid mesocolon lymph nodes.  Single hepatic metastatic lesion in the right hepatic lobe.  7 mm pulmonary nodule at the left lung base is suspicious for meta stasis. -Liver biopsy on 03/09/2018 consistent with adenocarcinoma.  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  Low starting dose  of Irinotecan is indicated. - 7 cycles of FOLFOX with vectibix completed on 06/27/2018.  Vectibix was left out during cycle 7.   - PET scan on 06/21/2018 showed interval resolution of the hypermetabolic area associated with right lobe of the liver metastasis.  Spleen has slightly enlarged to 13.4 cm in length and 12.9 cm previously.  Interval decrease in the radiotracer uptake associated with rectal neoplasm with SUV of 5 compared to 17.5 previously.  Previously noted FDG avid sigmoid mesocolon lymph nodes have resolved. - MRI of the liver on 06/25/2018 shows decrease in size of the liver metastasis to 5 mm, from 15 mm previously.  No new lesions noted. -Patient attended a multidisciplinary consultation with physicians at [facility] as a virtual visit. -She was recommended to have ablative therapy to the liver lesion.  She underwent ultrasound of her liver which did not show liver lesion.  Hence local treatment for liver lesion was put on hold at this time. - XRT with Xeloda from 07/29/2020 09/06/2018. - Xeloda was stopped on 09/05/2018 secondary to elevated bilirubin. -She was seen by Dr. Marcello Moores on 09/17/2018. -She is scheduled for surgery on 11/16/2018.  She has CT scan CAP ordered by [place] doctors on 09/27/2018. -She has a telephone consult with Dr. Leamon Arnt on 10/02/2018. -Her energy levels are improving.  Appetite is much better.  She is able to taste foods.  No weight loss reported. - I reviewed her blood work.  Bilirubin is 6.7.  Mild thrombocytopenia and leukopenia stable. - I will see her back in 3 to 4 weeks with repeat  labs.  We will go over the reports of the CT scans at that time. - Most recent CEA from 09/23/2017 was 0.9. - She has noticed constant numbness in her left big toe started 7 to 10 days ago.  Approximately same time, she also had on and off numbness and both index fingers on the nails.  We will closely watch it.  2.  Weight loss: As she lost about 7 pounds since the beginning of treatment.  Her  weight has been stable in the last 2 to 3 weeks.  Her appetite is picked up.  3.  Hypokalemia: -Potassium is 3.8.  She will continue potassium supplements.

## 2018-09-25 NOTE — Progress Notes (Signed)
[place], [place] [address]   CLINIC:  Medical Oncology/Hematology  PCP:  [name], MD [address] [place] [address] [phone]   REASON FOR VISIT:  Follow-up for rectal cancer   BRIEF ONCOLOGIC HISTORY:  Oncology History  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[place])  02/26/2018 Initial Diagnosis   Rectal cancer ([place])   03/13/2018 Cancer Staging   Staging form: Colon and Rectum, AJCC 8th Edition - Clinical stage from 03/13/2018: Stage IVA (cT3, cN1b, cM1a) - Signed by Derek Jack, MD on 03/13/2018   03/14/2018 -  Chemotherapy   The patient had palonosetron (ALOXI) injection 0.25 mg, 0.25 mg, Intravenous,  Once, 7 of 8 cycles Administration: 0.25 mg (03/14/2018), 0.25 mg (03/27/2018), 0.25 mg (04/18/2018), 0.25 mg (05/02/2018), 0.25 mg (05/16/2018), 0.25 mg (06/05/2018), 0.25 mg (06/27/2018) leucovorin 800 mg in dextrose 5 % 250 mL infusion, 772 mg, Intravenous,  Once, 7 of 8 cycles Administration: 800 mg (03/14/2018), 800 mg (03/27/2018), 800 mg (05/02/2018), 800 mg (05/16/2018), 700 mg (04/18/2018), 800 mg (06/05/2018), 800 mg (06/27/2018) oxaliplatin (ELOXATIN) 165 mg in dextrose 5 % 500 mL chemo infusion, 85 mg/m2 = 165 mg, Intravenous,  Once, 6 of 7 cycles Dose modification: 68 mg/m2 (80 % of original dose 85 mg/m2, Cycle 4, Reason: Provider Judgment) Administration: 165 mg (03/14/2018), 165 mg (03/27/2018), 130 mg (05/02/2018), 130 mg (05/16/2018), 130 mg (06/05/2018), 130 mg (06/27/2018) panitumumab (VECTIBIX) 500 mg in sodium chloride 0.9 % 100 mL chemo infusion, 480 mg, Intravenous,  Once, 4 of 5 cycles Administration: 500 mg (04/18/2018), 480 mg (05/02/2018), 480 mg (05/16/2018), 480 mg (06/05/2018) fluorouracil (ADRUCIL) chemo injection 750 mg, 400 mg/m2 = 750 mg (100 % of original dose 400 mg/m2), Intravenous,  Once, 6 of 7 cycles Dose modification: 400 mg/m2 (original dose 400 mg/m2, Cycle 1), 400 mg/m2 (original dose 400 mg/m2,  Cycle 3) Administration: 750 mg (03/14/2018), 750 mg (04/18/2018), 750 mg (05/02/2018), 750 mg (05/16/2018), 750 mg (06/05/2018), 750 mg (06/27/2018) fosaprepitant (EMEND) 150 mg, dexamethasone (DECADRON) 12 mg in sodium chloride 0.9 % 145 mL IVPB, , Intravenous,  Once, 4 of 5 cycles Administration:  (05/02/2018),  (05/16/2018),  (06/05/2018),  (06/27/2018) fluorouracil (ADRUCIL) 4,650 mg in sodium chloride 0.9 % 57 mL chemo infusion, 2,400 mg/m2 = 4,650 mg, Intravenous, 1 Day/Dose, 7 of 8 cycles Administration: 4,650 mg (03/14/2018), 4,650 mg (03/27/2018), 4,650 mg (04/18/2018), 4,650 mg (05/02/2018), 4,650 mg (05/16/2018), 4,650 mg (06/05/2018), 4,650 mg (06/27/2018)  for chemotherapy treatment.       CANCER STAGING: Cancer Staging Malignant neoplasm of rectum [facility]) Staging form: Colon and Rectum, AJCC 8th Edition - Clinical stage from 03/13/2018: Stage IVA (cT3, cN1b, cM1a) - Signed by Derek Jack, MD on 03/13/2018    INTERVAL HISTORY:  Ms. Konen 53 y.o. female seen for follow-up of rectal cancer.  She finished radiation therapy on 09/06/2018.  Appetite is 50%.  Energy levels are 50 to 75%.  Rectal area pain has also improved.  She is able to foods.  She has seen Dr. Marcello Moores on 09/17/2018.  Surgery has been scheduled on 11/16/2018.  She reported 7 to 10-day history of numbness in the index finger of both hands under the nails on and off.  She also reported left great toe constant numbness.  She is scheduled for CT scans on the second of next month.  She is able to taste foods and feel like eating at this time.  Does not report any fevers or infections.  REVIEW OF SYSTEMS:  Review of Systems  Neurological: Positive for numbness.  All other systems reviewed and are negative.    PAST MEDICAL/SURGICAL HISTORY:  Past Medical History:  Diagnosis Date  . Anxiety   . Cancer [facility]) 2013   thyroid  . Endometrial polyp   . Endometrial thickening on ultra sound   . GERD (gastroesophageal reflux  disease)   . 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 H/O Hashimoto thyroiditis   . 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 Hyperlipidemia 11/09/2015  . Hypothyroidism   . 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[place]) 02/26/2018   Past Surgical History:  Procedure Laterality Date  . BIOPSY  02/19/2018   Procedure: BIOPSY;  Surgeon: Rogene Houston, MD;  [facility];  Service: Endoscopy;;  rectum  . COLONOSCOPY N/A 08/20/2015   Procedure: COLONOSCOPY;  Surgeon: Rogene Houston, MD;  [facility];  Service: Endoscopy;  Laterality: N/A;  730  . [place] ENDOMETRIAL ABLATION  08-13-2004  . FLEXIBLE SIGMOIDOSCOPY N/A 02/19/2018   Procedure: FLEXIBLE SIGMOIDOSCOPY;  Surgeon: Rogene Houston, MD;  [facility];  Service: Endoscopy;  Laterality: N/A;  . HYSTEROSCOPY W/D&C N/A 04/30/2015   Procedure: DILATATION AND CURETTAGE / INTENDED HYSTEROSCOPY;  Surgeon: Dian Queen, MD;  Location: [place];  Service: Gynecology;  Laterality: N/A;  . IR US GUIDE BX ASP/DRAIN  03/09/2018  . LAPAROSCOPIC CHOLECYSTECTOMY  04/1996  . POLYPECTOMY  08/20/2015   Procedure: POLYPECTOMY;  Surgeon: Rogene Houston, MD;  [facility];  Service: Endoscopy;;  Splenic flexure polypectomy  . POLYPECTOMY  02/19/2018   Procedure: POLYPECTOMY;  Surgeon: Rogene Houston, MD;  [facility];  Service: Endoscopy;;  rectum  . PORTACATH PLACEMENT N/A 03/14/2018   Procedure: INSERTION PORT-A-CATH;  Surgeon: Aviva Signs, MD;  Location: [facility];  Service: General;  Laterality: N/A;  . REDUCTION INCARCERATED UTERUS  06-20-2000   intrauterine preg. 13 wks /  urinary retention  . THYROID LOBECTOMY  11/24/2011   Procedure: THYROID LOBECTOMY;  Surgeon: Earnstine Regal, MD;  Location: [facility];  Service: General;  Laterality:  Left;  Left Thyroid Lobectomy  . TUBAL LIGATION  2002     SOCIAL HISTORY:  Social History   Socioeconomic History  . Marital status: Married    Spouse name: Not on file  . Number of children: 4  . Years of education: Not on file  . Highest education level: Not on file  Occupational History    Comment: Marketing executive at [employer]  . Financial resource strain: Not hard at all  . Food insecurity    Worry: Never true    Inability: Never true  . Transportation needs    Medical: No    Non-medical: No  Tobacco Use  . Smoking status: Former Smoker    Packs/day: 0.25    Years: 10.00    Pack years: 2.50    Types: Cigarettes    Quit date: 10/31/1991    Years since quitting: 26.9  . Smokeless tobacco: Never Used  Substance and Sexual Activity  . Alcohol use: No  . Drug use: No  . Sexual activity: Not Currently  Lifestyle  . Physical activity    Days per week: 0 days    Minutes per session:  0 min  . Stress: To some extent  Relationships  . Social connections    Talks on phone: More than three times a week    Gets together: Twice a week    Attends religious service: More than 4 times per year    Active member of club or organization: Yes    Attends meetings of clubs or organizations: More than 4 times per year    Relationship status: Married  . Intimate partner violence    Fear of current or ex partner: No    Emotionally abused: No    Physically abused: No    Forced sexual activity: No  Other Topics Concern  . Not on file  Social History Narrative  . Not on file    FAMILY HISTORY:  Family History  Problem Relation Age of Onset  . Hypertension Mother   . Hypertension Father   . Prostate cancer Father   . Cancer Maternal Aunt        spine/back  . Cancer Paternal Grandfather        lung  . Healthy Son   . Healthy Son   . Healthy Son   . Healthy Son   . Colon cancer Neg Hx     CURRENT MEDICATIONS:  Outpatient Encounter Medications as of 09/25/2018   Medication Sig  . ALPRAZolam (XANAX) 0.5 MG tablet Take 1 tablet (0.5 mg total) by mouth 2 (two) times daily as needed for anxiety.  . Hydrocortisone-Aloe 0.5 % CREA Apply topically as needed.   . lidocaine-prilocaine (EMLA) cream Apply small amount to port site one hour prior to appointment and cover with plastic wrap.  . loperamide (IMODIUM) 2 MG capsule Take 2 mg by mouth as needed for diarrhea or loose stools.  . ondansetron (ZOFRAN) 4 MG tablet Take 1 tablet (4 mg total) by mouth every 8 (eight) hours as needed for nausea or vomiting.  . potassium chloride SA (K-DUR) 20 MEQ tablet Take 1 tablet (20 mEq total) by mouth 2 (two) times daily.  Marland Kitchen thyroid (ARMOUR) 90 MG tablet Take 90 mg by mouth daily before breakfast.   . [DISCONTINUED] capecitabine (XELODA) 500 MG tablet Take 3 tablets (1,500 mg total) by mouth 2 (two) times daily after a meal. Take Monday through Friday throughout the course of radiation. (Patient not taking: Reported on 09/10/2018)   Facility-Administered Encounter Medications as of 09/25/2018  Medication  . clindamycin (CLEOCIN) 900 mg in dextrose 5 % 50 mL IVPB   And  . gentamicin (GARAMYCIN) 350 mg in dextrose 5 % 50 mL IVPB  . sodium chloride 0.9 % 1,000 mL with potassium chloride 20 mEq, magnesium sulfate 2 g infusion    ALLERGIES:  I have reviewed her allergies.  PHYSICAL EXAM:  ECOG Performance status: 0  Vitals:   09/25/18 0806  BP: 108/77  Pulse: 94  Resp: 16  Temp: 97.7 F (36.5 C)  SpO2: 100%   Filed Weights   09/25/18 0806  Weight: 154 lb 11.2 oz (70.2 kg)    Physical Exam Vitals signs reviewed.  Constitutional:      Appearance: Normal appearance.  Cardiovascular:     Rate and Rhythm: Normal rate and regular rhythm.     Heart sounds: Normal heart sounds.  Pulmonary:     Effort: Pulmonary effort is normal.     Breath sounds: Normal breath sounds.  Abdominal:     General: There is no distension.     Palpations: Abdomen is soft. There is  no mass.  Musculoskeletal:        General: No swelling.  Skin:    General: Skin is warm.  Neurological:     General: No focal deficit present.     Mental Status: She is alert and oriented to person, place, and time.  Psychiatric:        Mood and Affect: Mood normal.      LABORATORY DATA:  I have reviewed the labs as listed.  CBC    Component Value Date/Time   WBC 3.3 (L) 09/24/2018 1030   RBC 3.27 (L) 09/24/2018 1030   HGB 11.6 (L) 09/24/2018 1030   HGB 13.5 11/03/2015 0841   HCT 33.8 (L) 09/24/2018 1030   HCT 39.7 11/03/2015 0841   PLT 101 (L) 09/24/2018 1030   PLT 165 11/03/2015 0841   MCV 103.4 (H) 09/24/2018 1030   MCV 95 11/03/2015 0841   MCH 35.5 (H) 09/24/2018 1030   MCHC 34.3 09/24/2018 1030   RDW 15.2 09/24/2018 1030   RDW 13.2 11/03/2015 0841   LYMPHSABS 0.4 (L) 09/24/2018 1030   LYMPHSABS 1.4 11/03/2015 0841   MONOABS 0.3 09/24/2018 1030   EOSABS 0.0 09/24/2018 1030   EOSABS 0.1 11/03/2015 0841   BASOSABS 0.0 09/24/2018 1030   BASOSABS 0.0 11/03/2015 0841   CMP Latest Ref Rng & Units 09/24/2018 09/10/2018 09/05/2018  Glucose 70 - 99 mg/dL 100(H) 96 -  BUN 6 - 20 mg/dL 11 10 -  Creatinine 0.44 - 1.00 mg/dL 0.65 0.72 -  Sodium 135 - 145 mmol/L 138 140 -  Potassium 3.5 - 5.1 mmol/L 3.8 3.5 -  Chloride 98 - 111 mmol/L 103 105 -  CO2 22 - 32 mmol/L 25 21(L) -  Calcium 8.9 - 10.3 mg/dL 9.7 9.6 -  Total Protein 6.5 - 8.1 g/dL 7.9 7.6 7.1  Total Bilirubin 0.3 - 1.2 mg/dL 6.7(H) 6.0(H) 7.0(H)  Alkaline Phos 38 - 126 U/L 98 82 89  AST 15 - 41 U/L 32 26 31  ALT 0 - 44 U/L '22 17 19       '$ DIAGNOSTIC IMAGING:  I have independently reviewed the scans and discussed with the patient.   I have reviewed Venita Lick LPN's note and agree with the documentation.  I personally performed a face-to-face visit, made revisions and my assessment and plan is as follows.    ASSESSMENT & PLAN:   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[facility]) 1.  T3 N1/2 M1a (stage IVa) rectal  adenocarcinoma: -Foundation 1 testing shows KRAS/NRAS wild-type, MSI-stable, TMB-low, TP53 R175H - 1 year of intermittent rectal bleeding, reports 20 pounds of weight loss in the last 3 months, although some of it is intentional. - Colonoscopy on 02/19/2018 shows rectal mass, 2 to 4 cm from the anal verge, normal sigmoid colon.  Biopsies consistent with adenocarcinoma. - MRI of the pelvis shows rectal adenocarcinoma, T stage-T3, N stage- N1-equivocal N2 nodes.  Maximum extension beyond muscularis propria is 6 mm (advanced T3).  Circumferential resection margin is 7 mm.  Right obturator node 6 mm.  Right external iliac node at 8 mm.  Craniocaudal extent of the tumor is 4.4 cm.  Approximately 180 degree circumferential.  Shortest distance from the distal tumor to anal sphincter is 2.1 cm.  Location from anal verge, 7.2 cm to the middle of the tumor. - CT CAP on 02/21/2018 shows 9 x 7 mm left lower lobe pulmonary nodule, new since 2009.  Possible subpleural left lower lobe 3 mm nodule versus area of  pleural thickening.  Vague area of hypoattenuation in the posterior right hepatic lobe measuring 1.6 cm.  Differential includes benign/malignant lesion with altered perfusion.  Focal steatosis is possible. - MRI of the liver on 03/08/2018 shows 1.4 cm lesion in the posterior right hepatic lobe.  There is a 5 mm focus of delayed hyperenhancement in the lateral segment of the left liver, questionable for a second metastatic deposit. -PET CT scan on 03/05/2018 shows hypermetabolic rectal mass.  Left perirectal hypermetabolic and sigmoid mesocolon lymph nodes.  Single hepatic metastatic lesion in the right hepatic lobe.  7 mm pulmonary nodule at the left lung base is suspicious for meta stasis. -Liver biopsy on 03/09/2018 consistent with adenocarcinoma.  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  Low starting dose of Irinotecan is indicated. - 7  cycles of FOLFOX with vectibix completed on 06/27/2018.  Vectibix was left out during cycle 7.   - PET scan on 06/21/2018 showed interval resolution of the hypermetabolic area associated with right lobe of the liver metastasis.  Spleen has slightly enlarged to 13.4 cm in length and 12.9 cm previously.  Interval decrease in the radiotracer uptake associated with rectal neoplasm with SUV of 5 compared to 17.5 previously.  Previously noted FDG avid sigmoid mesocolon lymph nodes have resolved. - MRI of the liver on 06/25/2018 shows decrease in size of the liver metastasis to 5 mm, from 15 mm previously.  No new lesions noted. -Patient attended a multidisciplinary consultation with physicians at [facility] as a virtual visit. -She was recommended to have ablative therapy to the liver lesion.  She underwent ultrasound of her liver which did not show liver lesion.  Hence local treatment for liver lesion was put on hold at this time. - XRT with Xeloda from 07/29/2020 09/06/2018. - Xeloda was stopped on 09/05/2018 secondary to elevated bilirubin. -She was seen by Dr. Marcello Moores on 09/17/2018. -She is scheduled for surgery on 11/16/2018.  She has CT scan CAP ordered by [facility] doctors on 09/27/2018. -She has a telephone consult with Dr. Leamon Arnt on 10/02/2018. -Her energy levels are improving.  Appetite is much better.  She is able to taste foods.  No weight loss reported. - I reviewed her blood work.  Bilirubin is 6.7.  Mild thrombocytopenia and leukopenia stable. - I will see her back in 3 to 4 weeks with repeat labs.  We will go over the reports of the CT scans at that time. - Most recent CEA from 09/23/2017 was 0.9. - She has noticed constant numbness in her left big toe started 7 to 10 days ago.  Approximately same time, she also had on and off numbness and both index fingers on the nails.  We will closely watch it.  2.  Weight loss: As she lost about 7 pounds since the beginning of treatment.  Her weight has been stable in the last 2  to 3 weeks.  Her appetite is picked up.  3.  Hypokalemia: -Potassium is 3.8.  She will continue potassium supplements.      Total time spent is 25 minutes with more than 50% of the time spent face-to-face discussing lab work, treatment plan and coordination of care.  Orders placed this encounter:  No orders of the defined types were placed in this encounter.     Derek Jack, MD [place] [phone]

## 2018-09-26 ENCOUNTER — Other Ambulatory Visit (HOSPITAL_COMMUNITY): Payer: Self-pay | Admitting: *Deleted

## 2018-09-26 ENCOUNTER — Other Ambulatory Visit (HOSPITAL_COMMUNITY): Payer: Self-pay

## 2018-09-26 DIAGNOSIS — C20 Malignant neoplasm of rectum: Secondary | ICD-10-CM

## 2018-09-26 DIAGNOSIS — E876 Hypokalemia: Secondary | ICD-10-CM

## 2018-09-26 MED ORDER — POTASSIUM CHLORIDE CRYS ER 20 MEQ PO TBCR: 20.0000 meq | EXTENDED_RELEASE_TABLET | Freq: Once | ORAL | 1 refills | Status: DC

## 2018-09-26 MED ORDER — POTASSIUM CHLORIDE CRYS ER 20 MEQ PO TBCR: 20.0000 meq | EXTENDED_RELEASE_TABLET | Freq: Every day | ORAL | 1 refills | Status: DC

## 2018-09-26 MED FILL — POTASSIUM CHLORIDE CRYS ER: 20 | 30 days supply | Qty: 30 | Fill #0

## 2018-09-26 NOTE — Telephone Encounter (Signed)
[facility] pharmacy called to verify potassium prescription sent for the patient.  Checked with Dr. Delton Coombes and should be Potassium 42meq by mouth daily.  New prescription sent to the pharmacy.

## 2018-09-26 NOTE — Telephone Encounter (Signed)
Per Dr. Delton Coombes, okay for patient to continue taking potassium but decrease it to once per day.  Patient is aware and new prescription sent to her pharmacy.

## 2018-09-27 ENCOUNTER — Other Ambulatory Visit: Payer: Self-pay

## 2018-09-27 ENCOUNTER — Ambulatory Visit (HOSPITAL_COMMUNITY)
Admission: RE | Admit: 2018-09-27 | Discharge: 2018-09-27 | Disposition: A | Discharge status: Home / Self Care | Payer: 59 | Source: Ambulatory Visit | Attending: Nurse Practitioner | Admitting: Nurse Practitioner

## 2018-09-27 DIAGNOSIS — C787 Secondary malignant neoplasm of liver and intrahepatic bile duct: Secondary | ICD-10-CM | POA: Diagnosis not present

## 2018-09-27 DIAGNOSIS — C20 Malignant neoplasm of rectum: Secondary | ICD-10-CM | POA: Insufficient documentation

## 2018-09-27 DIAGNOSIS — K769 Liver disease, unspecified: Secondary | ICD-10-CM | POA: Diagnosis not present

## 2018-09-27 DIAGNOSIS — R911 Solitary pulmonary nodule: Secondary | ICD-10-CM | POA: Diagnosis not present

## 2018-09-27 MED ORDER — IOHEXOL 300 MG/ML  SOLN
100.0000 mL | Freq: Once | INTRAMUSCULAR | Status: AC | PRN
  Administered 2018-09-27: 100 mL via INTRAVENOUS

## 2018-10-02 DIAGNOSIS — C787 Secondary malignant neoplasm of liver and intrahepatic bile duct: Secondary | ICD-10-CM | POA: Diagnosis not present

## 2018-10-02 DIAGNOSIS — C20 Malignant neoplasm of rectum: Secondary | ICD-10-CM | POA: Diagnosis not present

## 2018-10-02 DIAGNOSIS — C189 Malignant neoplasm of colon, unspecified: Secondary | ICD-10-CM | POA: Diagnosis not present

## 2018-10-04 DIAGNOSIS — C787 Secondary malignant neoplasm of liver and intrahepatic bile duct: Secondary | ICD-10-CM | POA: Insufficient documentation

## 2018-10-04 DIAGNOSIS — C20 Malignant neoplasm of rectum: Secondary | ICD-10-CM | POA: Insufficient documentation

## 2018-10-05 MED FILL — ALPRAZolam 0.5 MG TABS: 0.5 | 30 days supply | Qty: 60 | Fill #1

## 2018-10-10 ENCOUNTER — Other Ambulatory Visit (HOSPITAL_COMMUNITY): Payer: Self-pay | Admitting: *Deleted

## 2018-10-10 ENCOUNTER — Other Ambulatory Visit (HOSPITAL_COMMUNITY)
Admission: RE | Admit: 2018-10-10 | Discharge: 2018-10-10 | Disposition: A | Discharge status: Home / Self Care | Payer: 59 | Source: Ambulatory Visit | Attending: Hematology | Admitting: Hematology

## 2018-10-10 ENCOUNTER — Other Ambulatory Visit: Payer: Self-pay

## 2018-10-10 ENCOUNTER — Encounter (HOSPITAL_COMMUNITY)
Admission: RE | Admit: 2018-10-10 | Discharge: 2018-10-10 | Disposition: A | Discharge status: Home / Self Care | Payer: 59 | Source: Ambulatory Visit | Attending: General Surgery | Admitting: General Surgery

## 2018-10-10 DIAGNOSIS — C20 Malignant neoplasm of rectum: Secondary | ICD-10-CM

## 2018-10-10 LAB — CBC WITH DIFFERENTIAL/PLATELET
Abs Immature Granulocytes: 0.02 10*3/uL (ref 0.00–0.07)
Basophils Absolute: 0 10*3/uL (ref 0.0–0.1)
Basophils Relative: 0 %
Eosinophils Absolute: 0 10*3/uL (ref 0.0–0.5)
Eosinophils Relative: 1 %
HCT: 33.6 % — ABNORMAL LOW (ref 36.0–46.0)
Hemoglobin: 11.8 g/dL — ABNORMAL LOW (ref 12.0–15.0)
Immature Granulocytes: 1 %
Lymphocytes Relative: 14 %
Lymphs Abs: 0.5 10*3/uL — ABNORMAL LOW (ref 0.7–4.0)
MCH: 35.3 pg — ABNORMAL HIGH (ref 26.0–34.0)
MCHC: 35.1 g/dL (ref 30.0–36.0)
MCV: 100.6 fL — ABNORMAL HIGH (ref 80.0–100.0)
Monocytes Absolute: 0.2 10*3/uL (ref 0.1–1.0)
Monocytes Relative: 7 %
Neutro Abs: 2.7 10*3/uL (ref 1.7–7.7)
Neutrophils Relative %: 77 %
Platelets: 110 10*3/uL — ABNORMAL LOW (ref 150–400)
RBC: 3.34 MIL/uL — ABNORMAL LOW (ref 3.87–5.11)
RDW: 13.4 % (ref 11.5–15.5)
WBC: 3.4 10*3/uL — ABNORMAL LOW (ref 4.0–10.5)
nRBC: 0 % (ref 0.0–0.2)

## 2018-10-10 LAB — COMPREHENSIVE METABOLIC PANEL
ALT: 20 U/L (ref 0–44)
AST: 30 U/L (ref 15–41)
Albumin: 4.6 g/dL (ref 3.5–5.0)
Alkaline Phosphatase: 101 U/L (ref 38–126)
Anion gap: 9 (ref 5–15)
BUN: 13 mg/dL (ref 6–20)
CO2: 26 mmol/L (ref 22–32)
Calcium: 9.3 mg/dL (ref 8.9–10.3)
Chloride: 104 mmol/L (ref 98–111)
Creatinine, Ser: 0.71 mg/dL (ref 0.44–1.00)
GFR calc Af Amer: >60 mL/min (ref >60)
GFR calc non Af Amer: >60 mL/min (ref >60)
Glucose, Bld: 102 mg/dL — ABNORMAL HIGH (ref 70–99)
Potassium: 3.5 mmol/L (ref 3.5–5.1)
Sodium: 139 mmol/L (ref 135–145)
Total Bilirubin: 5.8 mg/dL — ABNORMAL HIGH (ref 0.3–1.2)
Total Protein: 7.5 g/dL (ref 6.5–8.1)

## 2018-10-10 LAB — PROTIME-INR
INR: 1 (ref 0.8–1.2)
Prothrombin Time: 13.2 seconds (ref 11.4–15.2)

## 2018-10-16 ENCOUNTER — Telehealth: Payer: Self-pay | Admitting: Radiation Oncology

## 2018-10-16 NOTE — Telephone Encounter (Signed)
  Radiation Oncology         [phone] ________________________________  Name: Samantha Reynolds MRN: [id number]  Date of Service: 10/16/2018  DOB: [birth year]  Post Treatment Telephone Note  Diagnosis:  Stage IV, IO0B5-5H7C adenocarcinoma of the rectum.  Interval Since Last Radiation:  6 weeks   07/30/2018-09/06/2018: The rectum was treated to 45 Gy in 25 fractions, and 5.4 Gy boost in 3 fractions  Narrative:  The patient was contacted today for routine follow-up. During treatment she did very well with radiotherapy and did not have significant desquamation. She reports she is feeling better and her diarrhea has improved. She had a restaging scan a few weeks ago that showed resolution of the rectal tumor. She does have a liver lesion that is ill defined on CT and u/s based imaging but was seen on MRI. She plans for surgery on 11/16/2018 with the surgeons at [facility], and is also planning to attempt IR ablation of her liver lesion if it can be seen by [name].  Impression/Plan: 1. Stage IV, [id number] adenocarcinoma of the rectum. The patient has been doing well since completion of radiotherapy. We discussed that we would be happy to continue to follow her as needed, but she will also continue to follow up with Dr. Delton Coombes in medical oncology.  2. Liver lesion. The patient will let us know how her procedure goes with IR. I will also discuss with Dr. Lisbeth Renshaw if they're not able to treat the lesion as he may be interested in offering SBRT.     Carola Rhine, PAC

## 2018-10-17 DIAGNOSIS — C787 Secondary malignant neoplasm of liver and intrahepatic bile duct: Secondary | ICD-10-CM | POA: Diagnosis not present

## 2018-10-17 DIAGNOSIS — C20 Malignant neoplasm of rectum: Secondary | ICD-10-CM | POA: Diagnosis not present

## 2018-10-17 HISTORY — PX: OTHER SURGICAL HISTORY: SHX169

## 2018-10-17 MED FILL — CIPROFLOXACIN HCL 250 MG TA: 250 | 7 days supply | Qty: 28 | Fill #0

## 2018-10-17 MED FILL — oxyCODONE HCL 5 MG TABS: 5 | 5 days supply | Qty: 30 | Fill #0

## 2018-10-23 ENCOUNTER — Other Ambulatory Visit: Payer: Self-pay

## 2018-10-23 ENCOUNTER — Inpatient Hospital Stay (HOSPITAL_COMMUNITY): Payer: 59 | Attending: Hematology

## 2018-10-23 ENCOUNTER — Other Ambulatory Visit (HOSPITAL_COMMUNITY): Payer: Self-pay

## 2018-10-23 DIAGNOSIS — C787 Secondary malignant neoplasm of liver and intrahepatic bile duct: Secondary | ICD-10-CM | POA: Insufficient documentation

## 2018-10-23 DIAGNOSIS — E876 Hypokalemia: Secondary | ICD-10-CM | POA: Insufficient documentation

## 2018-10-23 DIAGNOSIS — C20 Malignant neoplasm of rectum: Secondary | ICD-10-CM

## 2018-10-23 DIAGNOSIS — R634 Abnormal weight loss: Secondary | ICD-10-CM | POA: Diagnosis not present

## 2018-10-23 DIAGNOSIS — R200 Anesthesia of skin: Secondary | ICD-10-CM | POA: Diagnosis not present

## 2018-10-23 LAB — CBC WITH DIFFERENTIAL/PLATELET
Abs Immature Granulocytes: 0.01 10*3/uL (ref 0.00–0.07)
Basophils Absolute: 0 10*3/uL (ref 0.0–0.1)
Basophils Relative: 0 %
Eosinophils Absolute: 0 10*3/uL (ref 0.0–0.5)
Eosinophils Relative: 1 %
HCT: 34.8 % — ABNORMAL LOW (ref 36.0–46.0)
Hemoglobin: 11.8 g/dL — ABNORMAL LOW (ref 12.0–15.0)
Immature Granulocytes: 0 %
Lymphocytes Relative: 12 %
Lymphs Abs: 0.4 10*3/uL — ABNORMAL LOW (ref 0.7–4.0)
MCH: 34.6 pg — ABNORMAL HIGH (ref 26.0–34.0)
MCHC: 33.9 g/dL (ref 30.0–36.0)
MCV: 102.1 fL — ABNORMAL HIGH (ref 80.0–100.0)
Monocytes Absolute: 0.2 10*3/uL (ref 0.1–1.0)
Monocytes Relative: 7 %
Neutro Abs: 2.6 10*3/uL (ref 1.7–7.7)
Neutrophils Relative %: 80 %
Platelets: 106 10*3/uL — ABNORMAL LOW (ref 150–400)
RBC: 3.41 MIL/uL — ABNORMAL LOW (ref 3.87–5.11)
RDW: 13.2 % (ref 11.5–15.5)
WBC: 3.3 10*3/uL — ABNORMAL LOW (ref 4.0–10.5)
nRBC: 0 % (ref 0.0–0.2)

## 2018-10-23 LAB — COMPREHENSIVE METABOLIC PANEL
ALT: 31 U/L (ref 0–44)
AST: 42 U/L — ABNORMAL HIGH (ref 15–41)
Albumin: 4.6 g/dL (ref 3.5–5.0)
Alkaline Phosphatase: 106 U/L (ref 38–126)
Anion gap: 8 (ref 5–15)
BUN: 13 mg/dL (ref 6–20)
CO2: 25 mmol/L (ref 22–32)
Calcium: 9.1 mg/dL (ref 8.9–10.3)
Chloride: 107 mmol/L (ref 98–111)
Creatinine, Ser: 0.8 mg/dL (ref 0.44–1.00)
GFR calc Af Amer: >60 mL/min (ref >60)
GFR calc non Af Amer: >60 mL/min (ref >60)
Glucose, Bld: 76 mg/dL (ref 70–99)
Potassium: 3.4 mmol/L — ABNORMAL LOW (ref 3.5–5.1)
Sodium: 140 mmol/L (ref 135–145)
Total Bilirubin: 5 mg/dL — ABNORMAL HIGH (ref 0.3–1.2)
Total Protein: 7.5 g/dL (ref 6.5–8.1)

## 2018-10-23 LAB — MAGNESIUM: Magnesium: 2.4 mg/dL (ref 1.7–2.4)

## 2018-10-23 LAB — TSH: TSH: 0.372 u[IU]/mL (ref 0.350–4.500)

## 2018-10-24 ENCOUNTER — Inpatient Hospital Stay (HOSPITAL_BASED_OUTPATIENT_CLINIC_OR_DEPARTMENT_OTHER): Payer: 59 | Admitting: Hematology

## 2018-10-24 ENCOUNTER — Encounter (HOSPITAL_COMMUNITY): Payer: Self-pay | Admitting: Hematology

## 2018-10-24 DIAGNOSIS — R200 Anesthesia of skin: Secondary | ICD-10-CM | POA: Diagnosis not present

## 2018-10-24 DIAGNOSIS — R634 Abnormal weight loss: Secondary | ICD-10-CM | POA: Diagnosis not present

## 2018-10-24 DIAGNOSIS — E876 Hypokalemia: Secondary | ICD-10-CM

## 2018-10-24 DIAGNOSIS — C20 Malignant neoplasm of rectum: Secondary | ICD-10-CM

## 2018-10-24 DIAGNOSIS — C787 Secondary malignant neoplasm of liver and intrahepatic bile duct: Secondary | ICD-10-CM | POA: Diagnosis not present

## 2018-10-24 LAB — CEA: CEA: 1.1 ng/mL (ref 0.0–4.7)

## 2018-10-24 NOTE — Progress Notes (Signed)
Patient called clinic after appointment asking if she needs to continue potassium.  I spoke with Dr. Delton Coombes and patient is to continue potassium at current dose.  Patient is aware and verbalizes understanding.

## 2018-10-24 NOTE — Patient Instructions (Addendum)
[facility] at [facility] Discharge Instructions  You were seen today by Dr. Katragadda. He went over your recent lab results. He will see you back in 8 weeks for labs and follow up.   Thank you for choosing [facility] at [facility] to provide your oncology and hematology care.  To afford each patient quality time with our provider, please arrive at least 15 minutes before your scheduled appointment time.   If you have a lab appointment with the Cancer Center please come in thru the  Main Entrance and check in at the main information desk  You need to re-schedule your appointment should you arrive 10 or more minutes late.  We strive to give you quality time with our providers, and arriving late affects you and other patients whose appointments are after yours.  Also, if you no show three or more times for appointments you may be dismissed from the clinic at the providers discretion.     Again, thank you for choosing [facility].  Our hope is that these requests will decrease the amount of time that you wait before being seen by our physicians.       _____________________________________________________________  Should you have questions after your visit to [facility], please contact our office at [phone] between the hours of 8:00 a.m. and 4:30 p.m.  Voicemails left after 4:00 p.m. will not be returned until the following business day.  For prescription refill requests, have your pharmacy contact our office and allow 72 hours.    Cancer Center Support Programs:   > Cancer Support Group  2nd Tuesday of the month 1pm-2pm, Journey Room    

## 2018-10-24 NOTE — Assessment & Plan Note (Signed)
1.  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 - Colonoscopy on 02/19/2018 shows rectal mass, 2 to 4 cm from the anal verge, normal sigmoid colon.  Biopsies consistent with adenocarcinoma. - MRI of the pelvis shows rectal adenocarcinoma, T stage-T3, N stage- N1-equivocal N2 nodes.  Maximum extension beyond muscularis propria is 6 mm (advanced T3).  Circumferential resection margin is 7 mm.  Right obturator node 6 mm.  Right external iliac node at 8 mm.  Craniocaudal extent of the tumor is 4.4 cm.  Approximately 180 degree circumferential.  Shortest distance from the distal tumor to anal sphincter is 2.1 cm.  Location from anal verge, 7.2 cm to the middle of the tumor. - CT CAP on 02/21/2018 shows 9 x 7 mm left lower lobe pulmonary nodule, new since 2009.  Possible subpleural left lower lobe 3 mm nodule versus area of pleural thickening.  Vague area of hypoattenuation in the posterior right hepatic lobe measuring 1.6 cm.  Differential includes benign/malignant lesion with altered perfusion.  Focal steatosis is possible. - MRI of the liver on 03/08/2018 shows 1.4 cm lesion in the posterior right hepatic lobe.  There is a 5 mm focus of delayed hyperenhancement in the lateral segment of the left liver, questionable for a second metastatic deposit. -PET CT scan on 03/05/2018 shows hypermetabolic rectal mass.  Left perirectal hypermetabolic and sigmoid mesocolon lymph nodes.  Single hepatic metastatic lesion in the right hepatic lobe.  7 mm pulmonary nodule at the left lung base is suspicious for meta stasis. -Liver biopsy on 03/09/2018 consistent with adenocarcinoma.  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  Low starting dose  of Irinotecan is indicated. - 7 cycles of FOLFOX with vectibix completed on 06/27/2018.  Vectibix was left out during cycle 7.   - PET scan on 06/21/2018 showed interval resolution of the hypermetabolic area associated with right lobe of the liver metastasis.  Spleen has slightly enlarged to 13.4 cm in length and 12.9 cm previously.  Interval decrease in the radiotracer uptake associated with rectal neoplasm with SUV of 5 compared to 17.5 previously.  Previously noted FDG avid sigmoid mesocolon lymph nodes have resolved. - MRI of the liver on 06/25/2018 shows decrease in size of the liver metastasis to 5 mm, from 15 mm previously.  No new lesions noted. - XRT with Xeloda from 07/30/2018-09/06/2018.  Xeloda stopped on 09/05/2018 secondary to elevated bilirubin.  -CEA is 1.1 on 10/23/2018.  Total bilirubin is elevated at 5. - CT CAP on 09/27/2018 shows stable posttreatment findings of the rectal neoplasm, metastatic liver lesion and left lung nodule compared to PET CT scan from 06/21/2018.  No new metastatic disease. - Microwave ablation of the right liver lesion on 10/17/2018. - She is scheduled for surgery on 11/16/2018.  I will see her back 4 weeks after the surgery.  Patient is reluctant to consider any chemotherapy after surgery.  We will discuss pathology at that time.  2.  Weight loss: -Her weight has been stable over the last few visits.  3.  Hypokalemia: -Potassium today is 3.4.  She will continue potassium supplements.

## 2018-10-24 NOTE — Progress Notes (Signed)
[place], [place] [address]   CLINIC:  Medical Oncology/Hematology  PCP:  [name], MD [address] [place] [address] [phone]   REASON FOR VISIT:  Follow-up for rectal cancer   BRIEF ONCOLOGIC HISTORY:  Oncology History  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[place])  02/26/2018 Initial Diagnosis   Rectal cancer ([place])   03/13/2018 Cancer Staging   Staging form: Colon and Rectum, AJCC 8th Edition - Clinical stage from 03/13/2018: Stage IVA (cT3, cN1b, cM1a) - Signed by Derek Jack, MD on 03/13/2018   03/14/2018 -  Chemotherapy   The patient had palonosetron (ALOXI) injection 0.25 mg, 0.25 mg, Intravenous,  Once, 7 of 8 cycles Administration: 0.25 mg (03/14/2018), 0.25 mg (03/27/2018), 0.25 mg (04/18/2018), 0.25 mg (05/02/2018), 0.25 mg (05/16/2018), 0.25 mg (06/05/2018), 0.25 mg (06/27/2018) leucovorin 800 mg in dextrose 5 % 250 mL infusion, 772 mg, Intravenous,  Once, 7 of 8 cycles Administration: 800 mg (03/14/2018), 800 mg (03/27/2018), 800 mg (05/02/2018), 800 mg (05/16/2018), 700 mg (04/18/2018), 800 mg (06/05/2018), 800 mg (06/27/2018) oxaliplatin (ELOXATIN) 165 mg in dextrose 5 % 500 mL chemo infusion, 85 mg/m2 = 165 mg, Intravenous,  Once, 6 of 7 cycles Dose modification: 68 mg/m2 (80 % of original dose 85 mg/m2, Cycle 4, Reason: Provider Judgment) Administration: 165 mg (03/14/2018), 165 mg (03/27/2018), 130 mg (05/02/2018), 130 mg (05/16/2018), 130 mg (06/05/2018), 130 mg (06/27/2018) panitumumab (VECTIBIX) 500 mg in sodium chloride 0.9 % 100 mL chemo infusion, 480 mg, Intravenous,  Once, 4 of 5 cycles Administration: 500 mg (04/18/2018), 480 mg (05/02/2018), 480 mg (05/16/2018), 480 mg (06/05/2018) fluorouracil (ADRUCIL) chemo injection 750 mg, 400 mg/m2 = 750 mg (100 % of original dose 400 mg/m2), Intravenous,  Once, 6 of 7 cycles Dose modification: 400 mg/m2 (original dose 400 mg/m2, Cycle 1), 400 mg/m2 (original dose 400 mg/m2,  Cycle 3) Administration: 750 mg (03/14/2018), 750 mg (04/18/2018), 750 mg (05/02/2018), 750 mg (05/16/2018), 750 mg (06/05/2018), 750 mg (06/27/2018) fosaprepitant (EMEND) 150 mg, dexamethasone (DECADRON) 12 mg in sodium chloride 0.9 % 145 mL IVPB, , Intravenous,  Once, 4 of 5 cycles Administration:  (05/02/2018),  (05/16/2018),  (06/05/2018),  (06/27/2018) fluorouracil (ADRUCIL) 4,650 mg in sodium chloride 0.9 % 57 mL chemo infusion, 2,400 mg/m2 = 4,650 mg, Intravenous, 1 Day/Dose, 7 of 8 cycles Administration: 4,650 mg (03/14/2018), 4,650 mg (03/27/2018), 4,650 mg (04/18/2018), 4,650 mg (05/02/2018), 4,650 mg (05/16/2018), 4,650 mg (06/05/2018), 4,650 mg (06/27/2018)  for chemotherapy treatment.       CANCER STAGING: Cancer Staging Malignant neoplasm of rectum [facility]) Staging form: Colon and Rectum, AJCC 8th Edition - Clinical stage from 03/13/2018: Stage IVA (cT3, cN1b, cM1a) - Signed by Derek Jack, MD on 03/13/2018    INTERVAL HISTORY:  Ms. Akkerman 53 y.o. female seen for follow-up of rectal cancer.  She had microwave ablation of the right liver lesion done on 10/17/2018 and [facility].  She has some numbness in the fingertips which is stable.  Left foot toe numbness is also new.  Appetite is 75%.  Energy levels are 75%.  She is continuing to do full-time job.  Pain is reported as 0.  No nausea, vomiting or diarrhea reported.  No fevers or chills reported.  REVIEW OF SYSTEMS:  Review of Systems  Neurological: Positive for numbness.  All other systems reviewed and are negative.    PAST MEDICAL/SURGICAL HISTORY:  Past Medical History:  Diagnosis Date  . Anxiety   . Cancer [facility]) 2013  thyroid  . Endometrial polyp   . Endometrial thickening on ultra sound   . GERD (gastroesophageal reflux disease)   . 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 H/O Hashimoto thyroiditis   . History of thyroid cancer no recurrence   2013--  s/p  left lobe thyroidectomy--  follicular varient  papillary / lymphocyctic thyroiditis  . Hyperlipidemia 11/09/2015  . Hypothyroidism   . 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[place]) 02/26/2018   Past Surgical History:  Procedure Laterality Date  . BIOPSY  02/19/2018   Procedure: BIOPSY;  Surgeon: Rogene Houston, MD;  [facility];  Service: Endoscopy;;  rectum  . COLONOSCOPY N/A 08/20/2015   Procedure: COLONOSCOPY;  Surgeon: Rogene Houston, MD;  [facility];  Service: Endoscopy;  Laterality: N/A;  730  . [place] ENDOMETRIAL ABLATION  08-13-2004  . FLEXIBLE SIGMOIDOSCOPY N/A 02/19/2018   Procedure: FLEXIBLE SIGMOIDOSCOPY;  Surgeon: Rogene Houston, MD;  [facility];  Service: Endoscopy;  Laterality: N/A;  . HYSTEROSCOPY W/D&C N/A 04/30/2015   Procedure: DILATATION AND CURETTAGE / INTENDED HYSTEROSCOPY;  Surgeon: Dian Queen, MD;  Location: [place];  Service: Gynecology;  Laterality: N/A;  . IR US GUIDE BX ASP/DRAIN  03/09/2018  . LAPAROSCOPIC CHOLECYSTECTOMY  04/1996  . POLYPECTOMY  08/20/2015   Procedure: POLYPECTOMY;  Surgeon: Rogene Houston, MD;  [facility];  Service: Endoscopy;;  Splenic flexure polypectomy  . POLYPECTOMY  02/19/2018   Procedure: POLYPECTOMY;  Surgeon: Rogene Houston, MD;  [facility];  Service: Endoscopy;;  rectum  . PORTACATH PLACEMENT N/A 03/14/2018   Procedure: INSERTION PORT-A-CATH;  Surgeon: Aviva Signs, MD;  Location: [facility];  Service: General;  Laterality: N/A;  . REDUCTION INCARCERATED UTERUS  06-20-2000   intrauterine preg. 13 wks /  urinary retention  . THYROID LOBECTOMY  11/24/2011   Procedure: THYROID LOBECTOMY;  Surgeon: Earnstine Regal, MD;  Location: [facility];  Service: General;  Laterality: Left;  Left Thyroid Lobectomy  . TUBAL LIGATION  2002     SOCIAL HISTORY:  Social History   Socioeconomic History  . Marital status: Married    Spouse name: Not on file  . Number of children: 4  . Years of  education: Not on file  . Highest education level: Not on file  Occupational History    Comment: Marketing executive at [employer]  . Financial resource strain: Not hard at all  . Food insecurity    Worry: Never true    Inability: Never true  . Transportation needs    Medical: No    Non-medical: No  Tobacco Use  . Smoking status: Former Smoker    Packs/day: 0.25    Years: 10.00    Pack years: 2.50    Types: Cigarettes    Quit date: 10/31/1991    Years since quitting: 27.0  . Smokeless tobacco: Never Used  Substance and Sexual Activity  . Alcohol use: No  . Drug use: No  . Sexual activity: Not Currently  Lifestyle  . Physical activity    Days per week: 0 days    Minutes per session: 0 min  . Stress: To some extent  Relationships  . Social connections    Talks on phone: More than three times a week    Gets together: Twice a week    Attends religious service: More than 4 times  per year    Active member of club or organization: Yes    Attends meetings of clubs or organizations: More than 4 times per year    Relationship status: Married  . Intimate partner violence    Fear of current or ex partner: No    Emotionally abused: No    Physically abused: No    Forced sexual activity: No  Other Topics Concern  . Not on file  Social History Narrative  . Not on file    FAMILY HISTORY:  Family History  Problem Relation Age of Onset  . Hypertension Mother   . Hypertension Father   . Prostate cancer Father   . Cancer Maternal Aunt        spine/back  . Cancer Paternal Grandfather        lung  . Healthy Son   . Healthy Son   . Healthy Son   . Healthy Son   . Colon cancer Neg Hx     CURRENT MEDICATIONS:  Outpatient Encounter Medications as of 10/24/2018  Medication Sig  . oxyCODONE (OXY IR/ROXICODONE) 5 MG immediate release tablet Take 5 mg by mouth every 4 (four) hours as needed.   . ALPRAZolam (XANAX) 0.5 MG tablet Take 1 tablet (0.5 mg total) by mouth 2 (two)  times daily as needed for anxiety.  . ciprofloxacin (CIPRO) 250 MG tablet Take 250 mg by mouth 2 (two) times daily.   . Hydrocortisone-Aloe 0.5 % CREA Apply topically as needed.   . lidocaine-prilocaine (EMLA) cream Apply small amount to port site one hour prior to appointment and cover with plastic wrap.  . loperamide (IMODIUM) 2 MG capsule Take 2 mg by mouth as needed for diarrhea or loose stools.  . ondansetron (ZOFRAN) 4 MG tablet Take 1 tablet (4 mg total) by mouth every 8 (eight) hours as needed for nausea or vomiting.  . potassium chloride SA (K-DUR) 20 MEQ tablet Take 1 tablet (20 mEq total) by mouth daily.  Marland Kitchen thyroid (ARMOUR) 90 MG tablet Take 90 mg by mouth daily before breakfast.    Facility-Administered Encounter Medications as of 10/24/2018  Medication  . clindamycin (CLEOCIN) 900 mg in dextrose 5 % 50 mL IVPB   And  . gentamicin (GARAMYCIN) 350 mg in dextrose 5 % 50 mL IVPB  . sodium chloride 0.9 % 1,000 mL with potassium chloride 20 mEq, magnesium sulfate 2 g infusion    ALLERGIES:  I have reviewed her allergies.  PHYSICAL EXAM:  ECOG Performance status: 0  Vitals:   10/24/18 0806  BP: 95/71  Pulse: 91  Resp: 16  Temp: (!) 97.5 F (36.4 C)  SpO2: 100%   Filed Weights   10/24/18 0806  Weight: 154 lb 8 oz (70.1 kg)    Physical Exam Vitals signs reviewed.  Constitutional:      Appearance: Normal appearance.  Cardiovascular:     Rate and Rhythm: Normal rate and regular rhythm.     Heart sounds: Normal heart sounds.  Pulmonary:     Effort: Pulmonary effort is normal.     Breath sounds: Normal breath sounds.  Abdominal:     General: There is no distension.     Palpations: Abdomen is soft. There is no mass.  Musculoskeletal:        General: No swelling.  Skin:    General: Skin is warm.  Neurological:     General: No focal deficit present.     Mental Status: She is alert and oriented to  person, place, and time.  Psychiatric:        Mood and Affect: Mood  normal.      LABORATORY DATA:  I have reviewed the labs as listed.  CBC    Component Value Date/Time   WBC 3.3 (L) 10/23/2018 1048   RBC 3.41 (L) 10/23/2018 1048   HGB 11.8 (L) 10/23/2018 1048   HGB 13.5 11/03/2015 0841   HCT 34.8 (L) 10/23/2018 1048   HCT 39.7 11/03/2015 0841   PLT 106 (L) 10/23/2018 1048   PLT 165 11/03/2015 0841   MCV 102.1 (H) 10/23/2018 1048   MCV 95 11/03/2015 0841   MCH 34.6 (H) 10/23/2018 1048   MCHC 33.9 10/23/2018 1048   RDW 13.2 10/23/2018 1048   RDW 13.2 11/03/2015 0841   LYMPHSABS 0.4 (L) 10/23/2018 1048   LYMPHSABS 1.4 11/03/2015 0841   MONOABS 0.2 10/23/2018 1048   EOSABS 0.0 10/23/2018 1048   EOSABS 0.1 11/03/2015 0841   BASOSABS 0.0 10/23/2018 1048   BASOSABS 0.0 11/03/2015 0841   CMP Latest Ref Rng & Units 10/23/2018 10/10/2018 09/24/2018  Glucose 70 - 99 mg/dL 76 102(H) 100(H)  BUN 6 - 20 mg/dL _0 Creatinine 0.44 - 1.00 mg/dL 0.80 0.71 0.65  Sodium 135 - 145 mmol/L 140 139 138  Potassium 3.5 - 5.1 mmol/L 3.4(L) 3.5 3.8  Chloride 98 - 111 mmol/L 107 104 103  CO2 22 - 32 mmol/L _1 Calcium 8.9 - 10.3 mg/dL 9.1 9.3 9.7  Total Protein 6.5 - 8.1 g/dL 7.5 7.5 7.9  Total Bilirubin 0.3 - 1.2 mg/dL 5.0(H) 5.8(H) 6.7(H)  Alkaline Phos 38 - 126 U/L 106 101 98  AST 15 - 41 U/L 42(H) 30 32  ALT 0 - 44 U/L _2 DIAGNOSTIC IMAGING:  I have independently reviewed the scans and discussed with the patient.   I have reviewed Venita Lick LPN's note and agree with the documentation.  I personally performed a face-to-face visit, made revisions and my assessment and plan is as follows.    ASSESSMENT & PLAN:   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[facility]) 1.  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 - Colonoscopy  on 02/19/2018 shows rectal mass, 2 to 4 cm from the anal verge, normal sigmoid colon.  Biopsies consistent with adenocarcinoma. - MRI of the pelvis shows rectal adenocarcinoma, T stage-T3, N stage- N1-equivocal N2 nodes.  Maximum extension beyond muscularis propria is 6 mm (advanced T3).  Circumferential resection margin is 7 mm.  Right obturator node 6 mm.  Right external iliac node at 8 mm.  Craniocaudal extent of the tumor is 4.4 cm.  Approximately 180 degree circumferential.  Shortest distance from the distal tumor to anal sphincter is 2.1 cm.  Location from anal verge, 7.2 cm to the middle of the tumor. - CT CAP on 02/21/2018 shows 9 x 7 mm left lower lobe pulmonary nodule, new since 2009.  Possible subpleural left lower lobe 3 mm nodule versus area of pleural thickening.  Vague area of hypoattenuation in the posterior right hepatic lobe measuring 1.6 cm.  Differential includes benign/malignant lesion with altered perfusion.  Focal steatosis is possible. - MRI of the liver on 03/08/2018 shows 1.4 cm lesion in the posterior right hepatic  lobe.  There is a 5 mm focus of delayed hyperenhancement in the lateral segment of the left liver, questionable for a second metastatic deposit. -PET CT scan on 03/05/2018 shows hypermetabolic rectal mass.  Left perirectal hypermetabolic and sigmoid mesocolon lymph nodes.  Single hepatic metastatic lesion in the right hepatic lobe.  7 mm pulmonary nodule at the left lung base is suspicious for meta stasis. -Liver biopsy on 03/09/2018 consistent with adenocarcinoma.  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  Low starting dose of Irinotecan is indicated. - 7 cycles of FOLFOX with vectibix completed on 06/27/2018.  Vectibix was left out during cycle 7.   - PET scan on 06/21/2018 showed interval resolution of the hypermetabolic area associated with right lobe of the liver metastasis.  Spleen has slightly  enlarged to 13.4 cm in length and 12.9 cm previously.  Interval decrease in the radiotracer uptake associated with rectal neoplasm with SUV of 5 compared to 17.5 previously.  Previously noted FDG avid sigmoid mesocolon lymph nodes have resolved. - MRI of the liver on 06/25/2018 shows decrease in size of the liver metastasis to 5 mm, from 15 mm previously.  No new lesions noted. - XRT with Xeloda from 07/30/2018-09/06/2018.  Xeloda stopped on 09/05/2018 secondary to elevated bilirubin.  -CEA is 1.1 on 10/23/2018.  Total bilirubin is elevated at 5. - CT CAP on 09/27/2018 shows stable posttreatment findings of the rectal neoplasm, metastatic liver lesion and left lung nodule compared to PET CT scan from 06/21/2018.  No new metastatic disease. - Microwave ablation of the right liver lesion on 10/17/2018. - She is scheduled for surgery on 11/16/2018.  I will see her back 4 weeks after the surgery.  Patient is reluctant to consider any chemotherapy after surgery.  We will discuss pathology at that time.  2.  Weight loss: -Her weight has been stable over the last few visits.  3.  Hypokalemia: -Potassium today is 3.4.  She will continue potassium supplements.      Total time spent is 25 minutes with more than 50% of the time spent face-to-face discussing lab work, treatment plan and coordination of care.  Orders placed this encounter:  No orders of the defined types were placed in this encounter.     Derek Jack, MD [place] [phone]

## 2018-10-30 NOTE — Progress Notes (Signed)
  Radiation Oncology         ([phone]) [phone] ________________________________  Name: Samantha Reynolds MRN: [id number]  Date: 09/06/2018  DOB: 02/19/1966  End of Treatment Note  Diagnosis:   53 y.o. female with Stage IV adenocarcinoma of the rectum   Indication for treatment:  Curative       Radiation treatment dates:   07/30/2018 - 09/06/2018  Site/dose:    1. Rectum / 45 Gy in 25 fractions 2. Rectum Boost / 5.4 Gy in 3 fractions Total dose: 50.4 Gy  Beams/energy:    1. 3D / 10X, 6X, 15X Photon 2. 10X photon boost  Narrative: The patient tolerated radiation treatment relatively well.   She experienced moderate fatigue. She developed rectal irritation and is using skin creams and Neosporin to this area as directed. She reported diarrhea but states it is tolerable. No urinary complaints. She also noted poor appetite and weight loss; supplementing with Boost and Ensure drinks.  Plan: The patient has completed radiation treatment. The patient will return to radiation oncology clinic for routine followup in one month. I advised them to call or return sooner if they have any questions or concerns related to their recovery or treatment.  ------------------------------------------------  Jodelle Gross, MD, PhD  This document serves as a record of services personally performed by Kyung Rudd, MD. It was created on his behalf by Rae Lips, a trained medical scribe. The creation of this record is based on the scribe's personal observations and the provider's statements to them. This document has been checked and approved by the attending provider.

## 2018-10-31 DIAGNOSIS — C20 Malignant neoplasm of rectum: Secondary | ICD-10-CM | POA: Diagnosis not present

## 2018-10-31 DIAGNOSIS — C787 Secondary malignant neoplasm of liver and intrahepatic bile duct: Secondary | ICD-10-CM | POA: Diagnosis not present

## 2018-11-06 ENCOUNTER — Other Ambulatory Visit (HOSPITAL_COMMUNITY): Payer: Self-pay | Admitting: *Deleted

## 2018-11-06 DIAGNOSIS — C20 Malignant neoplasm of rectum: Secondary | ICD-10-CM

## 2018-11-06 MED FILL — ALPRAZolam 0.5 MG TABS: 0.5 | 30 days supply | Qty: 60 | Fill #2

## 2018-11-06 MED FILL — ARMOUR THYROID 90 MG TABLET: 90 | 90 days supply | Qty: 90 | Fill #0

## 2018-11-06 NOTE — Progress Notes (Signed)
Orders received from [facility], NP for patient to have CT chest abdomen pelvis around the end of October.  They want multiphasic imaging of abdomen and focus on the liver.  I spoke with Dr. Delton Coombes and he is okay to order them.

## 2018-11-07 NOTE — Progress Notes (Signed)
EKG 03-27-18 EPIC

## 2018-11-07 NOTE — Patient Instructions (Addendum)
YOU NEED TO HAVE A COVID 19 TEST ON 11-13-2018 at 3:20 pm.  THIS TEST MUST BE DONE BEFORE SURGERY, COME  TO [facility].  ONCE YOUR COVID TEST IS COMPLETED, PLEASE BEGIN THE QUARANTINE INSTRUCTIONS AS OUTLINED IN YOUR HANDOUT.                Samantha Reynolds    Your procedure is scheduled on: 11-16-2018   Report to [facility] Main  Entrance    Report to [place] at  5:30 AM   1 VISITOR IS ALLOWED TO WAIT IN WAITING ROOM  ONLY DAY OF YOUR SURGERY.    Call this number if you have problems the morning of surgery [phone]    Remember: CLEAR LIQUIDS ALL DAY Thursday . CLEAR LIQUIDS UNTIL 4:30 AM DAY OF SURGERY.   DRINK 2 PRESURGERY ENSURE DRINKS THE NIGHT BEFORE SURGERY AT 1000 PM AND 1 PRESURGERY DRINK THE DAY OF THE PROCEDURE AT 430 AM. PLEASE FINISH PRESURGERY ENSURE DRINK PER SURGEON ORDER 3 HOURS PRIOR TO SCHEDULED SURGERY AT 430 AM.    CLEAR LIQUID DIET   Foods Allowed                                                                     Foods Excluded  Coffee and tea, regular and decaf                             liquids that you cannot  Plain Jell-O any favor except red or purple                                           see through such as: Fruit ices (not with fruit pulp)                                     milk, soups, orange juice  Iced Popsicles                                    All solid food Carbonated beverages, regular and diet                                    Cranberry, grape and apple juices Sports drinks like Gatorade Lightly seasoned clear broth or consume(fat free) Sugar, honey syrup  Sample Menu Breakfast                                Lunch                                     Supper Cranberry juice                    Beef  broth                            Chicken broth Jell-O                                     Grape juice                           Apple juice Coffee or tea                        Jell-O                                       Popsicle                                                Coffee or tea                        Coffee or tea  _____________________________________________________________________     Take these medicines the morning of surgery with A SIP OF WATER: Thyroid   BRUSH YOUR TEETH MORNING OF SURGERY AND RINSE YOUR MOUTH OUT, NO CHEWING GUM CANDY OR MINTS.                               You may not have any metal on your body including hair pins and              piercings     Do not wear jewelry, make-up, lotions, powders or perfumes, deodorant              Do not wear nail polish.  Do not shave  48 hours prior to surgery.                 Do not bring valuables to the hospital. [place].  Contacts, dentures or bridgework may not be worn into surgery.  Leave suitcase in the car. After surgery it may be brought to your room.     _____________________________________________________________________             [facility] - Preparing for Surgery Before surgery, you can play an important role.  Because skin is not sterile, your skin needs to be as free of germs as possible.  You can reduce the number of germs on your skin by washing with CHG (chlorahexidine gluconate) soap before surgery.  CHG is an antiseptic cleaner which kills germs and bonds with the skin to continue killing germs even after washing. Please DO NOT use if you have an allergy to CHG or antibacterial soaps.  If your skin becomes reddened/irritated stop using the CHG and inform your nurse when you arrive at Short Stay. Do not shave (including legs and underarms) for at least 48 hours prior to the first CHG shower.  You may shave your face/neck. Please follow these instructions carefully:  1.  Shower with CHG Soap the night before surgery and the  morning of Surgery.  2.  If you choose to wash your hair, wash your hair first as usual with your  normal  shampoo.  3.  After  you shampoo, rinse your hair and body thoroughly to remove the  shampoo.                           4.  Use CHG as you would any other liquid soap.  You can apply chg directly  to the skin and wash                       Gently with a scrungie or clean washcloth.  5.  Apply the CHG Soap to your body ONLY FROM THE NECK DOWN.   Do not use on face/ open                           Wound or open sores. Avoid contact with eyes, ears mouth and genitals (private parts).                       Wash face,  Genitals (private parts) with your normal soap.             6.  Wash thoroughly, paying special attention to the area where your surgery  will be performed.  7.  Thoroughly rinse your body with warm water from the neck down.  8.  DO NOT shower/wash with your normal soap after using and rinsing off  the CHG Soap.                9.  Pat yourself dry with a clean towel.            10.  Wear clean pajamas.            11.  Place clean sheets on your bed the night of your first shower and do not  sleep with pets. Day of Surgery : Do not apply any lotions/deodorants the morning of surgery.  Please wear clean clothes to the hospital/surgery center.  FAILURE TO FOLLOW THESE INSTRUCTIONS MAY RESULT IN THE CANCELLATION OF YOUR SURGERY PATIENT SIGNATURE_________________________________  NURSE SIGNATURE__________________________________  ________________________________________________________________________  WHAT IS A BLOOD TRANSFUSION? Blood Transfusion Information  A transfusion is the replacement of blood or some of its parts. Blood is made up of multiple cells which provide different functions.  Red blood cells carry oxygen and are used for blood loss replacement.  White blood cells fight against infection.  Platelets control bleeding.  Plasma helps clot blood.  Other blood products are available for specialized needs, such as hemophilia or other clotting disorders. BEFORE THE TRANSFUSION  Who gives  blood for transfusions?   Healthy volunteers who are fully evaluated to make sure their blood is safe. This is blood bank blood. Transfusion therapy is the safest it has ever been in the practice of medicine. Before blood is taken from a donor, a complete history is taken to make sure that person has no history of diseases nor engages in risky social behavior (examples are intravenous drug use or sexual activity with multiple partners). The donor's travel history is screened to minimize risk of transmitting infections, such as malaria. The donated blood is tested for signs of infectious diseases, such as HIV and hepatitis. The blood is then tested  to be sure it is compatible with you in order to minimize the chance of a transfusion reaction. If you or a relative donates blood, this is often done in anticipation of surgery and is not appropriate for emergency situations. It takes many days to process the donated blood. RISKS AND COMPLICATIONS Although transfusion therapy is very safe and saves many lives, the main dangers of transfusion include:   Getting an infectious disease.  Developing a transfusion reaction. This is an allergic reaction to something in the blood you were given. Every precaution is taken to prevent this. The decision to have a blood transfusion has been considered carefully by your caregiver before blood is given. Blood is not given unless the benefits outweigh the risks. AFTER THE TRANSFUSION  Right after receiving a blood transfusion, you will usually feel much better and more energetic. This is especially true if your red blood cells have gotten low (anemic). The transfusion raises the level of the red blood cells which carry oxygen, and this usually causes an energy increase.  The nurse administering the transfusion will monitor you carefully for complications. HOME CARE INSTRUCTIONS  No special instructions are needed after a transfusion. You may find your energy is better.  Speak with your caregiver about any limitations on activity for underlying diseases you may have. SEEK MEDICAL CARE IF:   Your condition is not improving after your transfusion.  You develop redness or irritation at the intravenous (IV) site. SEEK IMMEDIATE MEDICAL CARE IF:  Any of the following symptoms occur over the next 12 hours:  Shaking chills.  You have a temperature by mouth above 102 F (38.9 C), not controlled by medicine.  Chest, back, or muscle pain.  People around you feel you are not acting correctly or are confused.  Shortness of breath or difficulty breathing.  Dizziness and fainting.  You get a rash or develop hives.  You have a decrease in urine output.  Your urine turns a dark color or changes to pink, red, or brown. Any of the following symptoms occur over the next 10 days:  You have a temperature by mouth above 102 F (38.9 C), not controlled by medicine.  Shortness of breath.  Weakness after normal activity.  The white part of the eye turns yellow (jaundice).  You have a decrease in the amount of urine or are urinating less often.  Your urine turns a dark color or changes to pink, red, or brown. Document Released: 03/11/2000 Document Revised: 06/06/2011 Document Reviewed: 10/29/2007 [facility] Patient Information 2014 [place], [place].  _______________________________________________________________________

## 2018-11-08 ENCOUNTER — Encounter (HOSPITAL_COMMUNITY): Payer: Self-pay | Admitting: Anesthesiology

## 2018-11-08 ENCOUNTER — Other Ambulatory Visit (HOSPITAL_COMMUNITY): Admission: RE | Admit: 2018-11-08 | Payer: 59 | Source: Ambulatory Visit

## 2018-11-08 ENCOUNTER — Encounter (HOSPITAL_COMMUNITY)
Admission: RE | Admit: 2018-11-08 | Discharge: 2018-11-08 | Disposition: A | Discharge status: Home / Self Care | Payer: 59 | Source: Ambulatory Visit | Attending: General Surgery | Admitting: General Surgery

## 2018-11-08 ENCOUNTER — Encounter (HOSPITAL_COMMUNITY): Payer: Self-pay

## 2018-11-08 ENCOUNTER — Other Ambulatory Visit (HOSPITAL_COMMUNITY): Payer: Self-pay | Admitting: *Deleted

## 2018-11-08 ENCOUNTER — Other Ambulatory Visit (HOSPITAL_COMMUNITY): Payer: Self-pay | Admitting: Hematology

## 2018-11-08 ENCOUNTER — Other Ambulatory Visit: Payer: Self-pay

## 2018-11-08 ENCOUNTER — Encounter (HOSPITAL_COMMUNITY): Payer: Self-pay | Admitting: Physician Assistant

## 2018-11-08 ENCOUNTER — Other Ambulatory Visit (HOSPITAL_COMMUNITY)
Admission: RE | Admit: 2018-11-08 | Discharge: 2018-11-08 | Disposition: A | Discharge status: Home / Self Care | Payer: 59 | Source: Ambulatory Visit | Attending: Hematology | Admitting: Hematology

## 2018-11-08 DIAGNOSIS — Z01812 Encounter for preprocedural laboratory examination: Secondary | ICD-10-CM | POA: Insufficient documentation

## 2018-11-08 DIAGNOSIS — C20 Malignant neoplasm of rectum: Secondary | ICD-10-CM | POA: Insufficient documentation

## 2018-11-08 LAB — CBC WITH DIFFERENTIAL/PLATELET
Abs Immature Granulocytes: 0.01 10*3/uL (ref 0.00–0.07)
Basophils Absolute: 0 10*3/uL (ref 0.0–0.1)
Basophils Relative: 0 %
Eosinophils Absolute: 0 10*3/uL (ref 0.0–0.5)
Eosinophils Relative: 0 %
HCT: 31.3 % — ABNORMAL LOW (ref 36.0–46.0)
Hemoglobin: 10.8 g/dL — ABNORMAL LOW (ref 12.0–15.0)
Immature Granulocytes: 1 %
Lymphocytes Relative: 10 %
Lymphs Abs: 0.2 10*3/uL — ABNORMAL LOW (ref 0.7–4.0)
MCH: 33.8 pg (ref 26.0–34.0)
MCHC: 34.5 g/dL (ref 30.0–36.0)
MCV: 97.8 fL (ref 80.0–100.0)
Monocytes Absolute: 0.3 10*3/uL (ref 0.1–1.0)
Monocytes Relative: 17 %
Neutro Abs: 1.2 10*3/uL — ABNORMAL LOW (ref 1.7–7.7)
Neutrophils Relative %: 72 %
Platelets: 87 10*3/uL — ABNORMAL LOW (ref 150–400)
RBC: 3.2 MIL/uL — ABNORMAL LOW (ref 3.87–5.11)
RDW: 12.5 % (ref 11.5–15.5)
WBC: 1.7 10*3/uL — ABNORMAL LOW (ref 4.0–10.5)
nRBC: 0 % (ref 0.0–0.2)

## 2018-11-08 LAB — CBC
HCT: 34 % — ABNORMAL LOW (ref 36.0–46.0)
Hemoglobin: 11.4 g/dL — ABNORMAL LOW (ref 12.0–15.0)
MCH: 33.5 pg (ref 26.0–34.0)
MCHC: 33.5 g/dL (ref 30.0–36.0)
MCV: 100 fL (ref 80.0–100.0)
Platelets: 82 10*3/uL — ABNORMAL LOW (ref 150–400)
RBC: 3.4 MIL/uL — ABNORMAL LOW (ref 3.87–5.11)
RDW: 12.6 % (ref 11.5–15.5)
WBC: 1.8 10*3/uL — ABNORMAL LOW (ref 4.0–10.5)
nRBC: 0 % (ref 0.0–0.2)

## 2018-11-08 LAB — HEPATIC FUNCTION PANEL
ALT: 23 U/L (ref 0–44)
AST: 31 U/L (ref 15–41)
Albumin: 4.4 g/dL (ref 3.5–5.0)
Alkaline Phosphatase: 109 U/L (ref 38–126)
Bilirubin, Direct: 0.3 mg/dL — ABNORMAL HIGH (ref 0.0–0.2)
Indirect Bilirubin: 3.3 mg/dL — ABNORMAL HIGH (ref 0.3–0.9)
Total Bilirubin: 3.6 mg/dL — ABNORMAL HIGH (ref 0.3–1.2)
Total Protein: 7.1 g/dL (ref 6.5–8.1)

## 2018-11-08 LAB — BASIC METABOLIC PANEL
Anion gap: 9 (ref 5–15)
BUN: 11 mg/dL (ref 6–20)
CO2: 25 mmol/L (ref 22–32)
Calcium: 9.3 mg/dL (ref 8.9–10.3)
Chloride: 106 mmol/L (ref 98–111)
Creatinine, Ser: 0.86 mg/dL (ref 0.44–1.00)
GFR calc Af Amer: >60 mL/min (ref >60)
GFR calc non Af Amer: >60 mL/min (ref >60)
Glucose, Bld: 103 mg/dL — ABNORMAL HIGH (ref 70–99)
Potassium: 3.7 mmol/L (ref 3.5–5.1)
Sodium: 140 mmol/L (ref 135–145)

## 2018-11-08 LAB — VITAMIN B12: Vitamin B-12: 141 pg/mL — ABNORMAL LOW (ref 180–914)

## 2018-11-08 LAB — ABO/RH: ABO/RH(D): A NEG

## 2018-11-08 LAB — PROTIME-INR
INR: 1 (ref 0.8–1.2)
Prothrombin Time: 12.9 seconds (ref 11.4–15.2)

## 2018-11-08 LAB — FOLATE: Folate: 13.5 ng/mL (ref >5.9)

## 2018-11-08 NOTE — Consult Note (Signed)
[place] Nurse ostomy consult note  [place] Nurse requested for preoperative stoma site marking by Dr. Marcello Moores.  Discussed surgical procedure and stoma creation with patient and husband.  Explained role of the [place] nurse team.  Answered patient and husband's questions.   Examined patient sitting and standing in order to place the marking in the patient's visual field, away from any creases or abdominal contour issues and within the rectus muscle.   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  Marked for ileostomy in the RUQ  7cm to the right of the umbilicus and 3cm above the umbilicus.   Patient's abdomen cleansed with CHG wipes at site markings, allowed to air dry prior to marking.Covered marks with thin film transparent dressing to preserve mark until date of surgery, which is 11/16/18.   [place] Nurse team will follow up with patient after surgery for continued ostomy care and teaching.   Thanks, Maudie Flakes, MSN, RN, [place], [place]  Pager# [phone]

## 2018-11-08 NOTE — Progress Notes (Signed)
11-08-18 CBC results routed to Dr. Marcello Moores for review

## 2018-11-09 ENCOUNTER — Inpatient Hospital Stay (HOSPITAL_COMMUNITY): Payer: 59 | Attending: Hematology

## 2018-11-09 ENCOUNTER — Other Ambulatory Visit (HOSPITAL_COMMUNITY): Payer: Self-pay | Admitting: Nurse Practitioner

## 2018-11-09 VITALS — BP 117/81 | HR 100 | Temp 98.7°F | Resp 16

## 2018-11-09 DIAGNOSIS — C20 Malignant neoplasm of rectum: Secondary | ICD-10-CM | POA: Diagnosis not present

## 2018-11-09 DIAGNOSIS — E875 Hyperkalemia: Secondary | ICD-10-CM | POA: Diagnosis not present

## 2018-11-09 DIAGNOSIS — Z801 Family history of malignant neoplasm of trachea, bronchus and lung: Secondary | ICD-10-CM | POA: Diagnosis not present

## 2018-11-09 DIAGNOSIS — Z79899 Other long term (current) drug therapy: Secondary | ICD-10-CM | POA: Insufficient documentation

## 2018-11-09 DIAGNOSIS — Z808 Family history of malignant neoplasm of other organs or systems: Secondary | ICD-10-CM | POA: Insufficient documentation

## 2018-11-09 DIAGNOSIS — Z8249 Family history of ischemic heart disease and other diseases of the circulatory system: Secondary | ICD-10-CM | POA: Diagnosis not present

## 2018-11-09 DIAGNOSIS — F17210 Nicotine dependence, cigarettes, uncomplicated: Secondary | ICD-10-CM | POA: Insufficient documentation

## 2018-11-09 DIAGNOSIS — E538 Deficiency of other specified B group vitamins: Secondary | ICD-10-CM

## 2018-11-09 DIAGNOSIS — R200 Anesthesia of skin: Secondary | ICD-10-CM | POA: Diagnosis not present

## 2018-11-09 DIAGNOSIS — Z8042 Family history of malignant neoplasm of prostate: Secondary | ICD-10-CM | POA: Insufficient documentation

## 2018-11-09 MED ORDER — CYANOCOBALAMIN 1000 MCG/ML IJ SOLN
INTRAMUSCULAR | Status: AC
  Filled 2018-11-09: qty 1

## 2018-11-09 MED ORDER — CYANOCOBALAMIN 1000 MCG/ML IJ SOLN
1000.0000 ug | Freq: Once | INTRAMUSCULAR | Status: AC
  Administered 2018-11-09: 1000 ug via INTRAMUSCULAR

## 2018-11-09 MED ORDER — CYANOCOBALAMIN 1000 MCG/ML IJ SOLN: 1000.0000 ug | Freq: Once | INTRAMUSCULAR | Status: DC

## 2018-11-09 NOTE — Progress Notes (Signed)
Patient tolerated injection with no complaints voiced.  Site clean and dry with no bruising or swelling noted at site.  Band aid applied.  Vss with discharge and left ambulatory with no s/s of distress noted.  

## 2018-11-11 LAB — METHYLMALONIC ACID, SERUM: Methylmalonic Acid, Quantitative: 290 nmol/L (ref 0–378)

## 2018-11-11 LAB — COPPER, SERUM: Copper: 121 ug/dL (ref 72–166)

## 2018-11-13 ENCOUNTER — Other Ambulatory Visit: Payer: Self-pay

## 2018-11-13 ENCOUNTER — Other Ambulatory Visit (HOSPITAL_COMMUNITY)
Admission: RE | Admit: 2018-11-13 | Discharge: 2018-11-13 | Disposition: A | Discharge status: Home / Self Care | Payer: 59 | Source: Ambulatory Visit | Attending: General Surgery | Admitting: General Surgery

## 2018-11-13 ENCOUNTER — Other Ambulatory Visit (HOSPITAL_COMMUNITY): Payer: 59

## 2018-11-13 DIAGNOSIS — Z01812 Encounter for preprocedural laboratory examination: Secondary | ICD-10-CM | POA: Diagnosis not present

## 2018-11-13 DIAGNOSIS — U071 COVID-19: Secondary | ICD-10-CM | POA: Insufficient documentation

## 2018-11-13 LAB — SARS CORONAVIRUS 2 (TAT 6-24 HRS): SARS Coronavirus 2: POSITIVE — AB

## 2018-11-14 ENCOUNTER — Other Ambulatory Visit (HOSPITAL_COMMUNITY): Payer: 59

## 2018-11-16 ENCOUNTER — Inpatient Hospital Stay (HOSPITAL_COMMUNITY): Admission: RE | Admit: 2018-11-16 | Payer: 59 | Source: Other Acute Inpatient Hospital | Admitting: General Surgery

## 2018-11-16 ENCOUNTER — Encounter (HOSPITAL_COMMUNITY): Admission: RE | Payer: Self-pay | Source: Other Acute Inpatient Hospital

## 2018-11-16 LAB — TYPE AND SCREEN
ABO/RH(D): A NEG
Antibody Screen: NEGATIVE

## 2018-11-16 SURGERY — RESECTION, RECTUM, LOW ANTERIOR, ROBOT-ASSISTED
Anesthesia: General

## 2018-11-22 ENCOUNTER — Encounter (HOSPITAL_COMMUNITY): Payer: Self-pay | Admitting: *Deleted

## 2018-11-22 NOTE — Progress Notes (Signed)
Patient called the clinic reporting itching all over her body.  She denies rash, redness or any changes in the appearance of her skin, just the itching.  She reports that nothing has changed other than she has started taking a B-12 vitamin.  I spoke with Dr. Delton Coombes and it is not coming from the vitamin.    I advised her to use hydrocortisone cream for the itching as needed.  She can also take benadryl to help calm it down as well.  She verbalizes understanding.

## 2018-11-26 ENCOUNTER — Ambulatory Visit: Payer: Self-pay | Admitting: General Surgery

## 2018-11-26 DIAGNOSIS — C20 Malignant neoplasm of rectum: Secondary | ICD-10-CM | POA: Diagnosis not present

## 2018-11-27 NOTE — Patient Instructions (Addendum)
DUE TO COVID-19 ONLY ONE VISITOR IS ALLOWED TO COME WITH YOU AND STAY IN THE WAITING ROOM ONLY DURING PRE OP AND PROCEDURE DAY OF SURGERY. THE 1 VISITOR MAY VISIT WITH YOU AFTER SURGERY IN YOUR PRIVATE ROOM DURING VISITING HOURS ONLY!   Follow instructions about self quarantine until after surgery!               Samantha Reynolds  11/27/2018   Your procedure is scheduled on: Friday 12/07/2018   Report to [facility] Main  Entrance              Report to admitting at 1000 AM     Call this number if you have problems the morning of surgery [phone]    Remember: Do not eat food :After Midnight on 12/05/2018.              Follow Bowel Prep instructions from Dr. Marcello Moores on Thursday 12/06/2018 and follow a clear liquid diet all day   DRINK 2 PRESURGERY ENSURE DRINKS THE NIGHT BEFORE SURGERY AT  1000 PM AND 1 PRESURGERY DRINK THE DAY OF THE PROCEDURE 3 HOURS PRIOR TO SCHEDULED SURGERY. NO SOLIDS AFTER MIDNIGHT THE DAY PRIOR TO THE SURGERY. NOTHING BY MOUTH EXCEPT CLEAR LIQUIDS UNTIL THREE HOURS PRIOR TO SCHEDULED SURGERY. PLEASE FINISH PRESURGERY ENSURE DRINK PER SURGEON ORDER 3 HOURS PRIOR TO SCHEDULED SURGERY TIME WHICH NEEDS TO BE COMPLETED AT 0900 am.    CLEAR LIQUID DIET   Foods Allowed                                                                     Foods Excluded  Coffee and tea, regular and decaf                             liquids that you cannot  Plain Jell-O any favor except red or purple                                           see through such as: Fruit ices (not with fruit pulp)                                     milk, soups, orange juice  Iced Popsicles                                    All solid food Carbonated beverages, regular and diet                                    Cranberry, grape and apple juices Sports drinks like Gatorade Lightly seasoned clear broth or consume(fat free) Sugar, honey syrup  Sample Menu Breakfast  Lunch                                     Supper Cranberry juice                    Beef broth                            Chicken broth Jell-O                                     Grape juice                           Apple juice Coffee or tea                        Jell-O                                      Popsicle                                                Coffee or tea                        Coffee or tea  _____________________________________________________________________               BRUSH YOUR TEETH MORNING OF SURGERY AND RINSE YOUR MOUTH OUT, NO CHEWING GUM CANDY OR MINTS.     Take these medicines the morning of surgery with A SIP OF WATER: Thyroid                                 You may not have any metal on your body including hair pins and              piercings  Do not wear jewelry, make-up, lotions, powders or perfumes, deodorant             Do not wear nail polish.  Do not shave  48 hours prior to surgery.               Do not bring valuables to the hospital. [place].  Contacts, dentures or bridgework may not be worn into surgery.  Leave suitcase in the car. After surgery it may be brought to your room.                  Please read over the following fact sheets you were given: _____________________________________________________________________             [facility] - Preparing for Surgery Before surgery, you can play an important role.  Because skin is not sterile, your skin needs to be as free of germs as possible.  You can reduce the number of germs on your skin by washing with CHG (chlorahexidine  gluconate) soap before surgery.  CHG is an antiseptic cleaner which kills germs and bonds with the skin to continue killing germs even after washing. Please DO NOT use if you have an allergy to CHG or antibacterial soaps.  If your skin becomes reddened/irritated stop using the CHG and inform your nurse when  you arrive at Short Stay. Do not shave (including legs and underarms) for at least 48 hours prior to the first CHG shower.  You may shave your face/neck. Please follow these instructions carefully:  1.  Shower with CHG Soap the night before surgery and the  morning of Surgery.  2.  If you choose to wash your hair, wash your hair first as usual with your  normal  shampoo.  3.  After you shampoo, rinse your hair and body thoroughly to remove the  shampoo.                           4.  Use CHG as you would any other liquid soap.  You can apply chg directly  to the skin and wash                       Gently with a scrungie or clean washcloth.  5.  Apply the CHG Soap to your body ONLY FROM THE NECK DOWN.   Do not use on face/ open                           Wound or open sores. Avoid contact with eyes, ears mouth and genitals (private parts).                       Wash face,  Genitals (private parts) with your normal soap.             6.  Wash thoroughly, paying special attention to the area where your surgery  will be performed.  7.  Thoroughly rinse your body with warm water from the neck down.  8.  DO NOT shower/wash with your normal soap after using and rinsing off  the CHG Soap.                9.  Pat yourself dry with a clean towel.            10.  Wear clean pajamas.            11.  Place clean sheets on your bed the night of your first shower and do not  sleep with pets. Day of Surgery : Do not apply any lotions/deodorants the morning of surgery.  Please wear clean clothes to the hospital/surgery center.  FAILURE TO FOLLOW THESE INSTRUCTIONS MAY RESULT IN THE CANCELLATION OF YOUR SURGERY PATIENT SIGNATURE_________________________________  NURSE SIGNATURE__________________________________  ________________________________________________________________________   Samantha Reynolds  An incentive spirometer is a tool that can help keep your lungs clear and active. This tool measures  how well you are filling your lungs with each breath. Taking long deep breaths may help reverse or decrease the chance of developing breathing (pulmonary) problems (especially infection) following:  A long period of time when you are unable to move or be active. BEFORE THE PROCEDURE   If the spirometer includes an indicator to show your best effort, your nurse or respiratory therapist will set it to a desired goal.  If possible, sit up straight or lean slightly  forward. Try not to slouch.  Hold the incentive spirometer in an upright position. INSTRUCTIONS FOR USE  1. Sit on the edge of your bed if possible, or sit up as far as you can in bed or on a chair. 2. Hold the incentive spirometer in an upright position. 3. Breathe out normally. 4. Place the mouthpiece in your mouth and seal your lips tightly around it. 5. Breathe in slowly and as deeply as possible, raising the piston or the ball toward the top of the column. 6. Hold your breath for 3-5 seconds or for as long as possible. Allow the piston or ball to fall to the bottom of the column. 7. Remove the mouthpiece from your mouth and breathe out normally. 8. Rest for a few seconds and repeat Steps 1 through 7 at least 10 times every 1-2 hours when you are awake. Take your time and take a few normal breaths between deep breaths. 9. The spirometer may include an indicator to show your best effort. Use the indicator as a goal to work toward during each repetition. 10. After each set of 10 deep breaths, practice coughing to be sure your lungs are clear. If you have an incision (the cut made at the time of surgery), support your incision when coughing by placing a pillow or rolled up towels firmly against it. Once you are able to get out of bed, walk around indoors and cough well. You may stop using the incentive spirometer when instructed by your caregiver.  RISKS AND COMPLICATIONS  Take your time so you do not get dizzy or light-headed.  If  you are in pain, you may need to take or ask for pain medication before doing incentive spirometry. It is harder to take a deep breath if you are having pain. AFTER USE  Rest and breathe slowly and easily.  It can be helpful to keep track of a log of your progress. Your caregiver can provide you with a simple table to help with this. If you are using the spirometer at home, follow these instructions: [place] IF:   You are having difficultly using the spirometer.  You have trouble using the spirometer as often as instructed.  Your pain medication is not giving enough relief while using the spirometer.  You develop fever of 100.5 F (38.1 C) or higher. SEEK IMMEDIATE MEDICAL CARE IF:   You cough up bloody sputum that had not been present before.  You develop fever of 102 F (38.9 C) or greater.  You develop worsening pain at or near the incision site. MAKE SURE YOU:   Understand these instructions.  Will watch your condition.  Will get help right away if you are not doing well or get worse. Document Released: 07/25/2006 Document Revised: 06/06/2011 Document Reviewed: 09/25/2006 [facility] Patient Information 2014 [place], [place].   ________________________________________________________________________

## 2018-11-29 ENCOUNTER — Encounter (HOSPITAL_COMMUNITY): Payer: Self-pay | Admitting: *Deleted

## 2018-11-29 ENCOUNTER — Encounter (HOSPITAL_COMMUNITY)
Admission: RE | Admit: 2018-11-29 | Discharge: 2018-11-29 | Disposition: A | Discharge status: Home / Self Care | Payer: 59 | Source: Ambulatory Visit | Attending: General Surgery | Admitting: General Surgery

## 2018-11-29 ENCOUNTER — Other Ambulatory Visit: Payer: Self-pay

## 2018-11-29 ENCOUNTER — Other Ambulatory Visit (HOSPITAL_COMMUNITY): Payer: Self-pay | Admitting: Hematology

## 2018-11-29 ENCOUNTER — Encounter (HOSPITAL_COMMUNITY): Payer: Self-pay

## 2018-11-29 DIAGNOSIS — Z01818 Encounter for other preprocedural examination: Secondary | ICD-10-CM | POA: Diagnosis not present

## 2018-11-29 DIAGNOSIS — R9431 Abnormal electrocardiogram [ECG] [EKG]: Secondary | ICD-10-CM | POA: Insufficient documentation

## 2018-11-29 LAB — COMPREHENSIVE METABOLIC PANEL
ALT: 23 U/L (ref 0–44)
AST: 31 U/L (ref 15–41)
Albumin: 4.4 g/dL (ref 3.5–5.0)
Alkaline Phosphatase: 103 U/L (ref 38–126)
Anion gap: 8 (ref 5–15)
BUN: 18 mg/dL (ref 6–20)
CO2: 27 mmol/L (ref 22–32)
Calcium: 9.3 mg/dL (ref 8.9–10.3)
Chloride: 104 mmol/L (ref 98–111)
Creatinine, Ser: 0.82 mg/dL (ref 0.44–1.00)
GFR calc Af Amer: >60 mL/min (ref >60)
GFR calc non Af Amer: >60 mL/min (ref >60)
Glucose, Bld: 98 mg/dL (ref 70–99)
Potassium: 3.5 mmol/L (ref 3.5–5.1)
Sodium: 139 mmol/L (ref 135–145)
Total Bilirubin: 3.3 mg/dL — ABNORMAL HIGH (ref 0.3–1.2)
Total Protein: 7.5 g/dL (ref 6.5–8.1)

## 2018-11-29 LAB — CBC WITH DIFFERENTIAL/PLATELET
Abs Immature Granulocytes: 0.03 10*3/uL (ref 0.00–0.07)
Basophils Absolute: 0 10*3/uL (ref 0.0–0.1)
Basophils Relative: 0 %
Eosinophils Absolute: 0 10*3/uL (ref 0.0–0.5)
Eosinophils Relative: 0 %
HCT: 36.6 % (ref 36.0–46.0)
Hemoglobin: 12.4 g/dL (ref 12.0–15.0)
Immature Granulocytes: 1 %
Lymphocytes Relative: 9 %
Lymphs Abs: 0.4 10*3/uL — ABNORMAL LOW (ref 0.7–4.0)
MCH: 33.1 pg (ref 26.0–34.0)
MCHC: 33.9 g/dL (ref 30.0–36.0)
MCV: 97.6 fL (ref 80.0–100.0)
Monocytes Absolute: 0.3 10*3/uL (ref 0.1–1.0)
Monocytes Relative: 6 %
Neutro Abs: 4 10*3/uL (ref 1.7–7.7)
Neutrophils Relative %: 84 %
Platelets: 114 10*3/uL — ABNORMAL LOW (ref 150–400)
RBC: 3.75 MIL/uL — ABNORMAL LOW (ref 3.87–5.11)
RDW: 13 % (ref 11.5–15.5)
WBC: 4.7 10*3/uL (ref 4.0–10.5)
nRBC: 0 % (ref 0.0–0.2)

## 2018-11-29 NOTE — Consult Note (Signed)
[place] Nurse requested for preoperative stoma site marking  Discussed surgical procedure and stoma creation with patient.  Explained role of the [place] nurse team.  Provided the patient with educational booklet and provided samples of pouching options.  Answered patient's questions.   Examined patient sitting, and standing in order to place the marking in the patient's visual field, away from any creases or abdominal contour issues and within the rectus muscle.  Attempted to mark below the patient's belt line on the left side and above on the right side.  There are creases which occur below the site markings which occur when she leans forward and should be avoided if possible.   Marked for colostomy in the LLQ  __6__ cm to the left of the umbilicus and [id number] below the umbilicus.  Marked for ileostomy in the RLQ  __6__cm to the right of the umbilicus and  [id number] cm above the umbilicus.  Patient's abdomen cleansed with CHG wipes at site markings, allowed to air dry prior to marking. Covered mark with thin film transparent dressing to preserve mark until date of surgery.   [place] Nurse team will follow up with patient after surgery for continued ostomy care and teaching.  Julien Girt MSN, RN, [place], [place], [place]

## 2018-11-29 NOTE — Progress Notes (Signed)
PCP - Dr. Sallee Lange Cardiologist - N/A  Chest x-ray - N/A  On 09/27/2018- CT chest with contrast in Epic  EKG - 11/29/2018 Stress Test - N/A ECHO - patient states had one many years ago but does not remember the reason why she had it  Cardiac Cath - N/A  Sleep Study - N/A CPAP - N/A  Fasting Blood Sugar - N/A Checks Blood Sugar _____ times a day  Blood Thinner Instructions: Aspirin Instructions:N/A Last Dose:  Anesthesia review:   Patient denies shortness of breath, fever, cough and chest pain at PAT appointment   Patient verbalized understanding of instructions that were given to them at the PAT appointment. Patient was also instructed that they will need to review over the PAT instructions again at home before surgery.

## 2018-11-29 NOTE — Progress Notes (Signed)
Patient called the clinic today reporting heightened anxiety pending her upcoming surgery.  She is prescribed xanax 0.5 mg BID and is asking if there is anything else she can do to help her alleviate some of this anxiety.  She reports being so worked up during the day that she is unable to sleep.  Orders received from Dr. Delton Coombes to increase her xanax to 0.5 mg in the mornings and then 1.5 mg QHS.  I have called patient and advised of the above. She is to only do this until her surgery and then should she need anything further she is to follow up with PCP for further management of anxiety.  She verbalizes understanding.

## 2018-11-30 ENCOUNTER — Other Ambulatory Visit: Payer: Self-pay | Admitting: Family Medicine

## 2018-11-30 MED ORDER — ALPRAZOLAM 0.5 MG PO TABS: ORAL_TABLET | ORAL | 0 refills | Status: DC

## 2018-11-30 MED FILL — ALPRAZolam 0.5 MG TABS: 0.5 | 30 days supply | Qty: 75 | Fill #0

## 2018-11-30 MED FILL — POTASSIUM CHLORIDE CRYS ER: 20 | 30 days supply | Qty: 30 | Fill #1

## 2018-12-04 ENCOUNTER — Other Ambulatory Visit (HOSPITAL_COMMUNITY): Payer: 59

## 2018-12-06 MED ORDER — BUPIVACAINE LIPOSOME 1.3 % IJ SUSP
20.0000 mL | INTRAMUSCULAR | Status: DC
  Filled 2018-12-06: qty 20

## 2018-12-06 NOTE — Progress Notes (Signed)
Pt aware of surgical time change and to arrive at [facility] admitting by 0930 on 12/07/2018. Pt aware to consume ERAS drink by 0830 and to take medications as instructed during PAT appt. Otherwise no food or drink after midnight. Pt verbalized understanding.

## 2018-12-07 ENCOUNTER — Inpatient Hospital Stay (HOSPITAL_COMMUNITY): Payer: 59 | Admitting: Registered Nurse

## 2018-12-07 ENCOUNTER — Inpatient Hospital Stay (HOSPITAL_COMMUNITY)
Admission: RE | Admit: 2018-12-07 | Discharge: 2018-12-21 | DRG: 330 | Disposition: A | Discharge status: Home Health | Payer: 59 | Attending: General Surgery | Admitting: General Surgery

## 2018-12-07 ENCOUNTER — Encounter (HOSPITAL_COMMUNITY): Payer: Self-pay | Admitting: *Deleted

## 2018-12-07 ENCOUNTER — Inpatient Hospital Stay (HOSPITAL_COMMUNITY): Payer: 59 | Admitting: Physician Assistant

## 2018-12-07 ENCOUNTER — Encounter (HOSPITAL_COMMUNITY)
Admission: RE | Disposition: A | Discharge status: Home Health | Payer: Self-pay | Source: Home / Self Care | Attending: General Surgery

## 2018-12-07 ENCOUNTER — Other Ambulatory Visit: Payer: Self-pay

## 2018-12-07 DIAGNOSIS — Z4682 Encounter for fitting and adjustment of non-vascular catheter: Secondary | ICD-10-CM | POA: Diagnosis not present

## 2018-12-07 DIAGNOSIS — Y833 Surgical operation with formation of external stoma as the cause of abnormal reaction of the patient, or of later complication, without mention of misadventure at the time of the procedure: Secondary | ICD-10-CM | POA: Diagnosis not present

## 2018-12-07 DIAGNOSIS — Z923 Personal history of irradiation: Secondary | ICD-10-CM | POA: Diagnosis not present

## 2018-12-07 DIAGNOSIS — E890 Postprocedural hypothyroidism: Secondary | ICD-10-CM | POA: Diagnosis present

## 2018-12-07 DIAGNOSIS — K219 Gastro-esophageal reflux disease without esophagitis: Secondary | ICD-10-CM | POA: Diagnosis present

## 2018-12-07 DIAGNOSIS — E440 Moderate protein-calorie malnutrition: Secondary | ICD-10-CM | POA: Diagnosis not present

## 2018-12-07 DIAGNOSIS — R110 Nausea: Secondary | ICD-10-CM | POA: Diagnosis not present

## 2018-12-07 DIAGNOSIS — Z8619 Personal history of other infectious and parasitic diseases: Secondary | ICD-10-CM | POA: Diagnosis not present

## 2018-12-07 DIAGNOSIS — R198 Other specified symptoms and signs involving the digestive system and abdomen: Secondary | ICD-10-CM

## 2018-12-07 DIAGNOSIS — Z8639 Personal history of other endocrine, nutritional and metabolic disease: Secondary | ICD-10-CM

## 2018-12-07 DIAGNOSIS — Z79899 Other long term (current) drug therapy: Secondary | ICD-10-CM

## 2018-12-07 DIAGNOSIS — Z9221 Personal history of antineoplastic chemotherapy: Secondary | ICD-10-CM | POA: Diagnosis not present

## 2018-12-07 DIAGNOSIS — R000 Tachycardia, unspecified: Secondary | ICD-10-CM | POA: Diagnosis not present

## 2018-12-07 DIAGNOSIS — Z932 Ileostomy status: Secondary | ICD-10-CM

## 2018-12-07 DIAGNOSIS — Z85850 Personal history of malignant neoplasm of thyroid: Secondary | ICD-10-CM | POA: Diagnosis not present

## 2018-12-07 DIAGNOSIS — Z8042 Family history of malignant neoplasm of prostate: Secondary | ICD-10-CM

## 2018-12-07 DIAGNOSIS — Z87891 Personal history of nicotine dependence: Secondary | ICD-10-CM | POA: Diagnosis not present

## 2018-12-07 DIAGNOSIS — Z6825 Body mass index (BMI) 25.0-25.9, adult: Secondary | ICD-10-CM | POA: Diagnosis not present

## 2018-12-07 DIAGNOSIS — Y733 Surgical instruments, materials and gastroenterology and urology devices (including sutures) associated with adverse incidents: Secondary | ICD-10-CM | POA: Diagnosis not present

## 2018-12-07 DIAGNOSIS — Z881 Allergy status to other antibiotic agents status: Secondary | ICD-10-CM

## 2018-12-07 DIAGNOSIS — Z808 Family history of malignant neoplasm of other organs or systems: Secondary | ICD-10-CM | POA: Diagnosis not present

## 2018-12-07 DIAGNOSIS — F419 Anxiety disorder, unspecified: Secondary | ICD-10-CM | POA: Diagnosis present

## 2018-12-07 DIAGNOSIS — Y92230 Patient room in hospital as the place of occurrence of the external cause: Secondary | ICD-10-CM | POA: Diagnosis not present

## 2018-12-07 DIAGNOSIS — E876 Hypokalemia: Secondary | ICD-10-CM | POA: Diagnosis not present

## 2018-12-07 DIAGNOSIS — Z79890 Hormone replacement therapy (postmenopausal): Secondary | ICD-10-CM

## 2018-12-07 DIAGNOSIS — C20 Malignant neoplasm of rectum: Secondary | ICD-10-CM | POA: Diagnosis not present

## 2018-12-07 DIAGNOSIS — E785 Hyperlipidemia, unspecified: Secondary | ICD-10-CM | POA: Diagnosis present

## 2018-12-07 DIAGNOSIS — K567 Ileus, unspecified: Secondary | ICD-10-CM | POA: Diagnosis not present

## 2018-12-07 DIAGNOSIS — Z885 Allergy status to narcotic agent status: Secondary | ICD-10-CM

## 2018-12-07 DIAGNOSIS — E039 Hypothyroidism, unspecified: Secondary | ICD-10-CM | POA: Diagnosis not present

## 2018-12-07 DIAGNOSIS — I10 Essential (primary) hypertension: Secondary | ICD-10-CM | POA: Diagnosis present

## 2018-12-07 DIAGNOSIS — K660 Peritoneal adhesions (postprocedural) (postinfection): Secondary | ICD-10-CM | POA: Diagnosis present

## 2018-12-07 DIAGNOSIS — K9189 Other postprocedural complications and disorders of digestive system: Secondary | ICD-10-CM | POA: Diagnosis not present

## 2018-12-07 DIAGNOSIS — E860 Dehydration: Secondary | ICD-10-CM | POA: Diagnosis not present

## 2018-12-07 DIAGNOSIS — K6389 Other specified diseases of intestine: Secondary | ICD-10-CM | POA: Diagnosis not present

## 2018-12-07 DIAGNOSIS — Z880 Allergy status to penicillin: Secondary | ICD-10-CM

## 2018-12-07 DIAGNOSIS — Z9089 Acquired absence of other organs: Secondary | ICD-10-CM | POA: Diagnosis not present

## 2018-12-07 DIAGNOSIS — Z801 Family history of malignant neoplasm of trachea, bronchus and lung: Secondary | ICD-10-CM

## 2018-12-07 DIAGNOSIS — K9413 Enterostomy malfunction: Secondary | ICD-10-CM | POA: Diagnosis not present

## 2018-12-07 DIAGNOSIS — Z978 Presence of other specified devices: Secondary | ICD-10-CM

## 2018-12-07 DIAGNOSIS — K9419 Other complications of enterostomy: Secondary | ICD-10-CM | POA: Diagnosis not present

## 2018-12-07 DIAGNOSIS — D649 Anemia, unspecified: Secondary | ICD-10-CM | POA: Diagnosis not present

## 2018-12-07 HISTORY — DX: Personal history of antineoplastic chemotherapy: Z92.21

## 2018-12-07 HISTORY — DX: Personal history of irradiation: Z92.3

## 2018-12-07 HISTORY — PX: XI ROBOTIC ASSISTED LOWER ANTERIOR RESECTION: SHX6558

## 2018-12-07 HISTORY — PX: DIVERTING ILEOSTOMY: SHX5799

## 2018-12-07 LAB — TYPE AND SCREEN
ABO/RH(D): A NEG
Antibody Screen: NEGATIVE

## 2018-12-07 SURGERY — RESECTION, RECTUM, LOW ANTERIOR, ROBOT-ASSISTED
Anesthesia: General | Site: Abdomen

## 2018-12-07 MED ORDER — KETOROLAC TROMETHAMINE 30 MG/ML IJ SOLN: 30.0000 mg | Freq: Once | INTRAMUSCULAR | Status: DC | PRN

## 2018-12-07 MED ORDER — DEXAMETHASONE SODIUM PHOSPHATE 10 MG/ML IJ SOLN
INTRAMUSCULAR | Status: AC
  Filled 2018-12-07: qty 1

## 2018-12-07 MED ORDER — THYROID 60 MG PO TABS
90.0000 mg | ORAL_TABLET | Freq: Every day | ORAL | Status: DC
  Administered 2018-12-08 – 2018-12-21 (×11): 90 mg via ORAL
  Filled 2018-12-07 (×14): qty 1

## 2018-12-07 MED ORDER — MIDAZOLAM HCL 5 MG/5ML IJ SOLN
INTRAMUSCULAR | Status: DC | PRN
  Administered 2018-12-07: 2 mg via INTRAVENOUS

## 2018-12-07 MED ORDER — GENTAMICIN SULFATE 40 MG/ML IJ SOLN
300.0000 mg | Freq: Once | INTRAVENOUS | Status: AC
  Administered 2018-12-07: 300 mg via INTRAVENOUS
  Filled 2018-12-07: qty 7.5

## 2018-12-07 MED ORDER — POTASSIUM CHLORIDE CRYS ER 20 MEQ PO TBCR: 20.0000 meq | EXTENDED_RELEASE_TABLET | Freq: Every day | ORAL | Status: DC

## 2018-12-07 MED ORDER — PROMETHAZINE HCL 25 MG/ML IJ SOLN: 6.2500 mg | INTRAMUSCULAR | Status: DC | PRN

## 2018-12-07 MED ORDER — PROPOFOL 10 MG/ML IV BOLUS
INTRAVENOUS | Status: AC
  Filled 2018-12-07: qty 20

## 2018-12-07 MED ORDER — HEPARIN SODIUM (PORCINE) 5000 UNIT/ML IJ SOLN
5000.0000 [IU] | Freq: Once | INTRAMUSCULAR | Status: AC
  Administered 2018-12-07: 5000 [IU] via SUBCUTANEOUS
  Filled 2018-12-07: qty 1

## 2018-12-07 MED ORDER — SCOPOLAMINE 1 MG/3DAYS TD PT72
MEDICATED_PATCH | TRANSDERMAL | Status: AC
  Filled 2018-12-07: qty 1

## 2018-12-07 MED ORDER — LIDOCAINE 20MG/ML (2%) 15 ML SYRINGE OPTIME
INTRAMUSCULAR | Status: DC | PRN
  Administered 2018-12-07: 1.5 mg/kg/h via INTRAVENOUS

## 2018-12-07 MED ORDER — CLINDAMYCIN PHOSPHATE 900 MG/50ML IV SOLN
900.0000 mg | Freq: Three times a day (TID) | INTRAVENOUS | Status: AC
  Administered 2018-12-07: 900 mg via INTRAVENOUS
  Filled 2018-12-07: qty 50

## 2018-12-07 MED ORDER — SCOPOLAMINE 1 MG/3DAYS TD PT72
MEDICATED_PATCH | TRANSDERMAL | Status: DC | PRN
  Administered 2018-12-07: 1 via TRANSDERMAL

## 2018-12-07 MED ORDER — KETAMINE HCL 10 MG/ML IJ SOLN
INTRAMUSCULAR | Status: DC | PRN
  Administered 2018-12-07: 30 mg via INTRAVENOUS

## 2018-12-07 MED ORDER — ALVIMOPAN 12 MG PO CAPS
12.0000 mg | ORAL_CAPSULE | ORAL | Status: AC
  Administered 2018-12-07: 10:00:00 12 mg via ORAL
  Filled 2018-12-07: qty 1

## 2018-12-07 MED ORDER — ROCURONIUM BROMIDE 10 MG/ML (PF) SYRINGE
PREFILLED_SYRINGE | INTRAVENOUS | Status: AC
  Filled 2018-12-07: qty 10

## 2018-12-07 MED ORDER — HYDROMORPHONE HCL 1 MG/ML IJ SOLN
0.2500 mg | INTRAMUSCULAR | Status: DC | PRN
  Administered 2018-12-07: 0.25 mg via INTRAVENOUS
  Administered 2018-12-07: 0.5 mg via INTRAVENOUS
  Administered 2018-12-07: 0.25 mg via INTRAVENOUS
  Administered 2018-12-07: 15:00:00 0.5 mg via INTRAVENOUS
  Administered 2018-12-07: 16:00:00 0.25 mg via INTRAVENOUS

## 2018-12-07 MED ORDER — KETAMINE HCL 10 MG/ML IJ SOLN
INTRAMUSCULAR | Status: AC
  Filled 2018-12-07: qty 1

## 2018-12-07 MED ORDER — HYDROMORPHONE HCL 1 MG/ML IJ SOLN
0.5000 mg | INTRAMUSCULAR | Status: DC | PRN
  Administered 2018-12-07 – 2018-12-14 (×27): 0.5 mg via INTRAVENOUS
  Filled 2018-12-07 (×29): qty 0.5

## 2018-12-07 MED ORDER — ACETAMINOPHEN 500 MG PO TABS
1000.0000 mg | ORAL_TABLET | Freq: Four times a day (QID) | ORAL | Status: DC
  Administered 2018-12-07 – 2018-12-10 (×11): 1000 mg via ORAL
  Filled 2018-12-07 (×12): qty 2

## 2018-12-07 MED ORDER — BUPIVACAINE-EPINEPHRINE 0.5% -1:200000 IJ SOLN
INTRAMUSCULAR | Status: DC | PRN
  Administered 2018-12-07: 30 mL

## 2018-12-07 MED ORDER — ALVIMOPAN 12 MG PO CAPS
12.0000 mg | ORAL_CAPSULE | Freq: Two times a day (BID) | ORAL | Status: DC
  Filled 2018-12-07: qty 1

## 2018-12-07 MED ORDER — LACTATED RINGERS IR SOLN
Status: DC | PRN
  Administered 2018-12-07: 1000 mL

## 2018-12-07 MED ORDER — BUPIVACAINE HCL (PF) 0.25 % IJ SOLN
INTRAMUSCULAR | Status: AC
  Filled 2018-12-07: qty 30

## 2018-12-07 MED ORDER — GABAPENTIN 300 MG PO CAPS
300.0000 mg | ORAL_CAPSULE | Freq: Two times a day (BID) | ORAL | Status: DC
  Administered 2018-12-07 – 2018-12-10 (×7): 300 mg via ORAL
  Filled 2018-12-07 (×9): qty 1

## 2018-12-07 MED ORDER — CLINDAMYCIN PHOSPHATE 900 MG/50ML IV SOLN
900.0000 mg | Freq: Once | INTRAVENOUS | Status: AC
  Administered 2018-12-07: 900 mg via INTRAVENOUS
  Filled 2018-12-07: qty 50

## 2018-12-07 MED ORDER — ALPRAZOLAM 0.5 MG PO TABS
0.5000 mg | ORAL_TABLET | Freq: Every evening | ORAL | Status: DC | PRN
  Administered 2018-12-14 – 2018-12-20 (×6): 0.5 mg via ORAL
  Filled 2018-12-07 (×6): qty 1

## 2018-12-07 MED ORDER — SUCCINYLCHOLINE CHLORIDE 200 MG/10ML IV SOSY
PREFILLED_SYRINGE | INTRAVENOUS | Status: AC
  Filled 2018-12-07: qty 10

## 2018-12-07 MED ORDER — LIDOCAINE 2% (20 MG/ML) 5 ML SYRINGE
INTRAMUSCULAR | Status: DC | PRN
  Administered 2018-12-07: 100 mg via INTRAVENOUS

## 2018-12-07 MED ORDER — BUPIVACAINE-EPINEPHRINE 0.5% -1:200000 IJ SOLN
INTRAMUSCULAR | Status: AC
  Filled 2018-12-07: qty 1

## 2018-12-07 MED ORDER — SACCHAROMYCES BOULARDII 250 MG PO CAPS
250.0000 mg | ORAL_CAPSULE | Freq: Two times a day (BID) | ORAL | Status: DC
  Administered 2018-12-07 – 2018-12-19 (×19): 250 mg via ORAL
  Filled 2018-12-07 (×21): qty 1

## 2018-12-07 MED ORDER — BUPIVACAINE LIPOSOME 1.3 % IJ SUSP
INTRAMUSCULAR | Status: DC | PRN
  Administered 2018-12-07: 20 mL

## 2018-12-07 MED ORDER — FENTANYL CITRATE (PF) 250 MCG/5ML IJ SOLN
INTRAMUSCULAR | Status: AC
  Filled 2018-12-07: qty 5

## 2018-12-07 MED ORDER — ONDANSETRON HCL 4 MG/2ML IJ SOLN
INTRAMUSCULAR | Status: AC
  Filled 2018-12-07: qty 2

## 2018-12-07 MED ORDER — GABAPENTIN 300 MG PO CAPS: 300.0000 mg | ORAL_CAPSULE | ORAL | Status: DC

## 2018-12-07 MED ORDER — ACETAMINOPHEN 500 MG PO TABS
1000.0000 mg | ORAL_TABLET | ORAL | Status: AC
  Administered 2018-12-07: 1000 mg via ORAL
  Filled 2018-12-07: qty 2

## 2018-12-07 MED ORDER — FENTANYL CITRATE (PF) 250 MCG/5ML IJ SOLN
INTRAMUSCULAR | Status: DC | PRN
  Administered 2018-12-07: 50 ug via INTRAVENOUS
  Administered 2018-12-07: 150 ug via INTRAVENOUS
  Administered 2018-12-07: 50 ug via INTRAVENOUS

## 2018-12-07 MED ORDER — TRAMADOL HCL 50 MG PO TABS
50.0000 mg | ORAL_TABLET | Freq: Four times a day (QID) | ORAL | Status: DC | PRN
  Administered 2018-12-07 – 2018-12-10 (×7): 50 mg via ORAL
  Filled 2018-12-07 (×8): qty 1

## 2018-12-07 MED ORDER — GABAPENTIN 300 MG PO CAPS
300.0000 mg | ORAL_CAPSULE | ORAL | Status: AC
  Administered 2018-12-07: 300 mg via ORAL
  Filled 2018-12-07: qty 1

## 2018-12-07 MED ORDER — 0.9 % SODIUM CHLORIDE (POUR BTL) OPTIME
TOPICAL | Status: DC | PRN
  Administered 2018-12-07: 2000 mL

## 2018-12-07 MED ORDER — ROCURONIUM BROMIDE 10 MG/ML (PF) SYRINGE
PREFILLED_SYRINGE | INTRAVENOUS | Status: DC | PRN
  Administered 2018-12-07: 20 mg via INTRAVENOUS
  Administered 2018-12-07: 60 mg via INTRAVENOUS

## 2018-12-07 MED ORDER — ALUM & MAG HYDROXIDE-SIMETH 200-200-20 MG/5ML PO SUSP
30.0000 mL | Freq: Four times a day (QID) | ORAL | Status: DC | PRN
  Administered 2018-12-15: 30 mL via ORAL
  Filled 2018-12-07 (×2): qty 30

## 2018-12-07 MED ORDER — SUGAMMADEX SODIUM 200 MG/2ML IV SOLN
INTRAVENOUS | Status: DC | PRN
  Administered 2018-12-07: 200 mg via INTRAVENOUS

## 2018-12-07 MED ORDER — ENSURE SURGERY PO LIQD
237.0000 mL | Freq: Two times a day (BID) | ORAL | Status: DC
  Administered 2018-12-08 – 2018-12-20 (×11): 237 mL via ORAL
  Filled 2018-12-07 (×29): qty 237

## 2018-12-07 MED ORDER — DEXAMETHASONE SODIUM PHOSPHATE 10 MG/ML IJ SOLN
INTRAMUSCULAR | Status: DC | PRN
  Administered 2018-12-07: 8 mg via INTRAVENOUS

## 2018-12-07 MED ORDER — ONDANSETRON HCL 4 MG PO TABS: 4.0000 mg | ORAL_TABLET | Freq: Four times a day (QID) | ORAL | Status: DC | PRN

## 2018-12-07 MED ORDER — HYDROMORPHONE HCL 1 MG/ML IJ SOLN
INTRAMUSCULAR | Status: AC
  Administered 2018-12-07: 15:00:00 0.5 mg via INTRAVENOUS
  Filled 2018-12-07: qty 1

## 2018-12-07 MED ORDER — KCL IN DEXTROSE-NACL 20-5-0.45 MEQ/L-%-% IV SOLN
INTRAVENOUS | Status: DC
  Administered 2018-12-07 – 2018-12-19 (×11): via INTRAVENOUS
  Filled 2018-12-07 (×21): qty 1000

## 2018-12-07 MED ORDER — LIDOCAINE 2% (20 MG/ML) 5 ML SYRINGE
INTRAMUSCULAR | Status: AC
  Filled 2018-12-07: qty 5

## 2018-12-07 MED ORDER — MIDAZOLAM HCL 2 MG/2ML IJ SOLN
INTRAMUSCULAR | Status: AC
  Filled 2018-12-07: qty 2

## 2018-12-07 MED ORDER — BUPIVACAINE LIPOSOME 1.3 % IJ SUSP: 20.0000 mL | Freq: Once | INTRAMUSCULAR | Status: DC

## 2018-12-07 MED ORDER — LACTATED RINGERS IV SOLN
INTRAVENOUS | Status: DC
  Administered 2018-12-07 (×2): via INTRAVENOUS

## 2018-12-07 MED ORDER — PHENYLEPHRINE 40 MCG/ML (10ML) SYRINGE FOR IV PUSH (FOR BLOOD PRESSURE SUPPORT)
PREFILLED_SYRINGE | INTRAVENOUS | Status: AC
  Filled 2018-12-07: qty 10

## 2018-12-07 MED ORDER — ACETAMINOPHEN 500 MG PO TABS: 1000.0000 mg | ORAL_TABLET | ORAL | Status: DC

## 2018-12-07 MED ORDER — HEPARIN SODIUM (PORCINE) 5000 UNIT/ML IJ SOLN: 5000.0000 [IU] | Freq: Once | INTRAMUSCULAR | Status: DC

## 2018-12-07 MED ORDER — INDOCYANINE GREEN 25 MG IV SOLR
INTRAVENOUS | Status: DC | PRN
  Administered 2018-12-07: 2.5 mg via INTRAVENOUS

## 2018-12-07 MED ORDER — ONDANSETRON HCL 4 MG/2ML IJ SOLN
INTRAMUSCULAR | Status: DC | PRN
  Administered 2018-12-07: 4 mg via INTRAVENOUS

## 2018-12-07 MED ORDER — ENOXAPARIN SODIUM 40 MG/0.4ML ~~LOC~~ SOLN
40.0000 mg | SUBCUTANEOUS | Status: DC
  Administered 2018-12-08 – 2018-12-21 (×14): 40 mg via SUBCUTANEOUS
  Filled 2018-12-07 (×14): qty 0.4

## 2018-12-07 MED ORDER — ONDANSETRON HCL 4 MG/2ML IJ SOLN
4.0000 mg | Freq: Four times a day (QID) | INTRAMUSCULAR | Status: DC | PRN
  Administered 2018-12-08 – 2018-12-20 (×14): 4 mg via INTRAVENOUS
  Filled 2018-12-07 (×15): qty 2

## 2018-12-07 MED ORDER — LIDOCAINE HCL 2 % IJ SOLN
INTRAMUSCULAR | Status: AC
  Filled 2018-12-07: qty 20

## 2018-12-07 MED ORDER — PHENYLEPHRINE 40 MCG/ML (10ML) SYRINGE FOR IV PUSH (FOR BLOOD PRESSURE SUPPORT)
PREFILLED_SYRINGE | INTRAVENOUS | Status: DC | PRN
  Administered 2018-12-07: 80 ug via INTRAVENOUS

## 2018-12-07 MED ORDER — HYDROMORPHONE HCL 1 MG/ML IJ SOLN
INTRAMUSCULAR | Status: AC
  Administered 2018-12-07: 0.25 mg via INTRAVENOUS
  Filled 2018-12-07: qty 1

## 2018-12-07 MED ORDER — PROPOFOL 10 MG/ML IV BOLUS
INTRAVENOUS | Status: DC | PRN
  Administered 2018-12-07: 60 mg via INTRAVENOUS

## 2018-12-07 SURGICAL SUPPLY — 114 items
ADH SKN CLS APL DERMABOND .7 (GAUZE/BANDAGES/DRESSINGS) ×1
BARRIER SKIN 2 3/4 (OSTOMY) ×2 IMPLANT
BARRIER SKIN OD2.25 2 3/4 FLNG (OSTOMY) IMPLANT
BLADE EXTENDED COATED 6.5IN (ELECTRODE) IMPLANT
BRR SKN FLT 2.75X2.25 2 PC (OSTOMY) ×1
CANNULA REDUC XI 12-8 STAPL (CANNULA) ×1
CANNULA REDUCER 12-8 DVNC XI (CANNULA) ×1 IMPLANT
CATH ROBINSON RED A/P 16FR (CATHETERS) ×1 IMPLANT
CELLS DAT CNTRL 66122 CELL SVR (MISCELLANEOUS) IMPLANT
CLIP VESOLOCK LG 6/CT PURPLE (CLIP) IMPLANT
CLIP VESOLOCK MED 6/CT (CLIP) IMPLANT
COVER SURGICAL LIGHT HANDLE (MISCELLANEOUS) ×4 IMPLANT
COVER TIP SHEARS 8 DVNC (MISCELLANEOUS) ×1 IMPLANT
COVER TIP SHEARS 8MM DA VINCI (MISCELLANEOUS) ×1
COVER WAND RF STERILE (DRAPES) ×2 IMPLANT
DECANTER SPIKE VIAL GLASS SM (MISCELLANEOUS) ×1 IMPLANT
DERMABOND ADVANCED (GAUZE/BANDAGES/DRESSINGS) ×1
DERMABOND ADVANCED .7 DNX12 (GAUZE/BANDAGES/DRESSINGS) IMPLANT
DRAIN CHANNEL 19F RND (DRAIN) ×1 IMPLANT
DRAPE ARM DVNC X/XI (DISPOSABLE) ×4 IMPLANT
DRAPE COLUMN DVNC XI (DISPOSABLE) ×1 IMPLANT
DRAPE DA VINCI XI ARM (DISPOSABLE) ×4
DRAPE DA VINCI XI COLUMN (DISPOSABLE) ×1
DRAPE SURG IRRIG POUCH 19X23 (DRAPES) ×2 IMPLANT
DRSG OPSITE POSTOP 4X10 (GAUZE/BANDAGES/DRESSINGS) IMPLANT
DRSG OPSITE POSTOP 4X6 (GAUZE/BANDAGES/DRESSINGS) IMPLANT
DRSG OPSITE POSTOP 4X8 (GAUZE/BANDAGES/DRESSINGS) IMPLANT
ELECT PENCIL ROCKER SW 15FT (MISCELLANEOUS) ×2 IMPLANT
ELECT REM PT RETURN 15FT ADLT (MISCELLANEOUS) ×2 IMPLANT
ENDOLOOP SUT PDS II  0 18 (SUTURE)
ENDOLOOP SUT PDS II 0 18 (SUTURE) IMPLANT
EVACUATOR SILICONE 100CC (DRAIN) ×1 IMPLANT
GAUZE SPONGE 4X4 12PLY STRL (GAUZE/BANDAGES/DRESSINGS) IMPLANT
GLOVE BIO SURGEON STRL SZ 6.5 (GLOVE) ×8 IMPLANT
GLOVE BIOGEL PI IND STRL 6.5 (GLOVE) IMPLANT
GLOVE BIOGEL PI IND STRL 7.0 (GLOVE) ×3 IMPLANT
GLOVE BIOGEL PI IND STRL 7.5 (GLOVE) IMPLANT
GLOVE BIOGEL PI INDICATOR 6.5 (GLOVE) ×2
GLOVE BIOGEL PI INDICATOR 7.0 (GLOVE) ×3
GLOVE BIOGEL PI INDICATOR 7.5 (GLOVE) ×2
GLOVE ECLIPSE 8.0 STRL XLNG CF (GLOVE) ×2 IMPLANT
GLOVE INDICATOR 8.0 STRL GRN (GLOVE) ×2 IMPLANT
GLOVE SURG SS PI 7.0 STRL IVOR (GLOVE) ×2 IMPLANT
GOWN STRL REUS W/ TWL LRG LVL3 (GOWN DISPOSABLE) IMPLANT
GOWN STRL REUS W/TWL LRG LVL3 (GOWN DISPOSABLE) ×4
GOWN STRL REUS W/TWL XL LVL3 (GOWN DISPOSABLE) ×10 IMPLANT
GRASPER ENDOPATH ANVIL 10MM (MISCELLANEOUS) IMPLANT
GRASPER SUT TROCAR 14GX15 (MISCELLANEOUS) IMPLANT
HOLDER FOLEY CATH W/STRAP (MISCELLANEOUS) ×2 IMPLANT
IRRIG SUCT STRYKERFLOW 2 WTIP (MISCELLANEOUS) ×2
IRRIGATION SUCT STRKRFLW 2 WTP (MISCELLANEOUS) ×1 IMPLANT
IRRIGATOR SUCT 8 DISP DVNC XI (IRRIGATION / IRRIGATOR) IMPLANT
IRRIGATOR SUCTION 8MM XI DISP (IRRIGATION / IRRIGATOR)
KIT PROCEDURE DA VINCI SI (MISCELLANEOUS) ×1
KIT PROCEDURE DVNC SI (MISCELLANEOUS) IMPLANT
KIT SIGMOIDOSCOPE (SET/KITS/TRAYS/PACK) ×1 IMPLANT
KIT TURNOVER KIT A (KITS) IMPLANT
NDL INSUFFLATION 14GA 120MM (NEEDLE) ×1 IMPLANT
NEEDLE INSUFFLATION 14GA 120MM (NEEDLE) ×2 IMPLANT
PACK CARDIOVASCULAR III (CUSTOM PROCEDURE TRAY) ×2 IMPLANT
PACK COLON (CUSTOM PROCEDURE TRAY) ×2 IMPLANT
PAD POSITIONING PINK XL (MISCELLANEOUS) ×1 IMPLANT
PORT LAP GEL ALEXIS MED 5-9CM (MISCELLANEOUS) ×2 IMPLANT
POUCH OSTOMY 2 3/4  H 3804 (WOUND CARE) ×1
POUCH OSTOMY 2 3/4 H 3804 (WOUND CARE) ×1
POUCH OSTOMY 2 PC DRNBL 2.75 (WOUND CARE) IMPLANT
RELOAD STAPLE 45 BLU REG DVNC (STAPLE) IMPLANT
RELOAD STAPLE 45 GRN THCK DVNC (STAPLE) IMPLANT
RETRACTOR WND ALEXIS 18 MED (MISCELLANEOUS) IMPLANT
RTRCTR WOUND ALEXIS 18CM MED (MISCELLANEOUS)
SCISSORS LAP 5X35 DISP (ENDOMECHANICALS) ×2 IMPLANT
SEAL CANN UNIV 5-8 DVNC XI (MISCELLANEOUS) ×3 IMPLANT
SEAL XI 5MM-8MM UNIVERSAL (MISCELLANEOUS) ×4
SEALER VESSEL DA VINCI XI (MISCELLANEOUS) ×1
SEALER VESSEL EXT DVNC XI (MISCELLANEOUS) ×1 IMPLANT
SLEEVE ADV FIXATION 5X100MM (TROCAR) IMPLANT
SOLUTION ELECTROLUBE (MISCELLANEOUS) ×2 IMPLANT
SPONGE DRAIN TRACH 4X4 STRL 2S (GAUZE/BANDAGES/DRESSINGS) ×1 IMPLANT
STAPLER 45 BLU RELOAD XI (STAPLE) IMPLANT
STAPLER 45 BLUE RELOAD XI (STAPLE)
STAPLER 45 GREEN RELOAD XI (STAPLE) ×2
STAPLER 45 GRN RELOAD XI (STAPLE) ×2 IMPLANT
STAPLER CANNULA SEAL DVNC XI (STAPLE) ×1 IMPLANT
STAPLER CANNULA SEAL XI (STAPLE) ×1
STAPLER ECHELON POWER CIR 29 (STAPLE) ×1 IMPLANT
STAPLER SHEATH (SHEATH) ×1
STAPLER SHEATH ENDOWRIST DVNC (SHEATH) ×1 IMPLANT
STAPLER VISISTAT 35W (STAPLE) IMPLANT
SUT ETHILON 2 0 PS N (SUTURE) ×1 IMPLANT
SUT NOVA NAB DX-16 0-1 5-0 T12 (SUTURE) ×4 IMPLANT
SUT PROLENE 2 0 KS (SUTURE) ×2 IMPLANT
SUT SILK 2 0 (SUTURE) ×2
SUT SILK 2 0 SH CR/8 (SUTURE) ×1 IMPLANT
SUT SILK 2-0 18XBRD TIE 12 (SUTURE) ×1 IMPLANT
SUT SILK 3 0 (SUTURE) ×2
SUT SILK 3 0 SH CR/8 (SUTURE) ×2 IMPLANT
SUT SILK 3-0 18XBRD TIE 12 (SUTURE) IMPLANT
SUT V-LOC BARB 180 2/0GR6 GS22 (SUTURE)
SUT VIC AB 2-0 SH 18 (SUTURE) ×2 IMPLANT
SUT VIC AB 2-0 SH 27 (SUTURE) ×2
SUT VIC AB 2-0 SH 27X BRD (SUTURE) ×1 IMPLANT
SUT VIC AB 3-0 SH 18 (SUTURE) IMPLANT
SUT VIC AB 4-0 PS2 27 (SUTURE) ×3 IMPLANT
SUT VICRYL 0 UR6 27IN ABS (SUTURE) ×1 IMPLANT
SUTURE V-LC BRB 180 2/0GR6GS22 (SUTURE) IMPLANT
SYR 10ML ECCENTRIC (SYRINGE) ×2 IMPLANT
SYS LAPSCP GELPORT 120MM (MISCELLANEOUS)
SYSTEM LAPSCP GELPORT 120MM (MISCELLANEOUS) IMPLANT
TOWEL OR 17X26 10 PK STRL BLUE (TOWEL DISPOSABLE) IMPLANT
TOWEL OR NON WOVEN STRL DISP B (DISPOSABLE) ×2 IMPLANT
TRAY FOLEY MTR SLVR 14FR STAT (SET/KITS/TRAYS/PACK) ×1 IMPLANT
TROCAR ADV FIXATION 5X100MM (TROCAR) ×2 IMPLANT
TUBING CONNECTING 10 (TUBING) ×4 IMPLANT
TUBING INSUFFLATION 10FT LAP (TUBING) ×2 IMPLANT

## 2018-12-07 NOTE — H&P (Signed)
Samantha Reynolds is an 53 y.o. female.   Chief Complaint: 53 y.o. F with rectal cancer.  HPI:  S/p neoadjuvant chemoRT.  Her surgery was delayed 3 wks due to positive Covid.  She is now ready to proceed with surgery.    Past Medical History:  Diagnosis Date  . Anxiety   . Cancer [facility]) 2013   thyroid  . Endometrial polyp   . Endometrial thickening on ultra sound   . GERD (gastroesophageal reflux disease)   . 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 H/O Hashimoto thyroiditis   . 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 Hyperlipidemia 11/09/2015  . Hypothyroidism   . 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[place]) 02/26/2018    Past Surgical History:  Procedure Laterality Date  . BIOPSY  02/19/2018   Procedure: BIOPSY;  Surgeon: Samantha Houston, MD;  [facility];  Service: Endoscopy;;  rectum  . COLONOSCOPY N/A 08/20/2015   Procedure: COLONOSCOPY;  Surgeon: Samantha Houston, MD;  [facility];  Service: Endoscopy;  Laterality: N/A;  730  . Samantha Reynolds ENDOMETRIAL ABLATION  08-13-2004  . FLEXIBLE SIGMOIDOSCOPY N/A 02/19/2018   Procedure: FLEXIBLE SIGMOIDOSCOPY;  Surgeon: Samantha Houston, MD;  [facility];  Service: Endoscopy;  Laterality: N/A;  . HYSTEROSCOPY W/D&C N/A 04/30/2015   Procedure: DILATATION AND CURETTAGE / INTENDED HYSTEROSCOPY;  Surgeon: Samantha Queen, MD;  Location: [place];  Service: Gynecology;  Laterality: N/A;  . IR US GUIDE BX ASP/DRAIN  03/09/2018  . LAPAROSCOPIC CHOLECYSTECTOMY  04/1996  . Liver Microwave   10/17/2018  . POLYPECTOMY  08/20/2015   Procedure: POLYPECTOMY;  Surgeon: Samantha Houston, MD;  [facility];  Service: Endoscopy;;  Splenic flexure polypectomy  . POLYPECTOMY  02/19/2018   Procedure: POLYPECTOMY;  Surgeon: Samantha Houston, MD;  [facility];  Service: Endoscopy;;  rectum  . PORTACATH PLACEMENT N/A 03/14/2018   Procedure: INSERTION PORT-A-CATH;  Surgeon: Samantha Signs, MD;  Location: [facility];  Service: General;  Laterality: N/A;  . REDUCTION INCARCERATED UTERUS  06-20-2000   intrauterine preg. 13 wks /  urinary retention  . THYROID LOBECTOMY  11/24/2011   Procedure: THYROID LOBECTOMY;  Surgeon: Samantha Regal, MD;  Location: [facility];  Service: General;  Laterality: Left;  Left Thyroid Lobectomy  . TUBAL LIGATION  2002    Family History  Problem Relation Age of Onset  . Hypertension Mother   . Hypertension Father   . Prostate cancer Father   . Cancer Maternal Aunt        spine/back  . Cancer Paternal Grandfather        lung  . Healthy Son   . Healthy Son   . Healthy Son   . Healthy Son   . Colon cancer Neg Hx    Social History:  reports that she quit smoking about 27 years ago. Her smoking use included cigarettes. She has a 2.50 pack-year smoking history. She has never used smokeless tobacco. She reports that she does not drink alcohol or use drugs.  Allergies:  Allergies  Allergen Reactions  . Demerol [Meperidine] Itching    All over the body.  . Penicillins Hives     childhood, does not remember if it spread all over the body or not. Did  it involve swelling of the face/tongue/throat, SOB, or low BP? No Did it involve sudden or severe rash/hives, skin peeling, or any reaction on the inside of your mouth or nose? Yes Did you need to seek medical attention at a hospital or doctor's office? Yes When did it last happen?childhood allergy If all above answers are "NO", may proceed with cephalosporin use.   . Other Hives and Other (See Comments)    Fresh coconut   . Vancomycin Rash    Will need Benadryl prior to administration per IV. "Red man syndrome"    Medications Prior to Admission  Medication Sig Dispense Refill  . lidocaine-prilocaine (EMLA) cream Apply small amount to port site one hour prior to  appointment and cover with plastic wrap. (Patient taking differently: Apply 1 application topically daily as needed (port access). ) 30 g 3  . ondansetron (ZOFRAN) 4 MG tablet Take 1 tablet (4 mg total) by mouth every 8 (eight) hours as needed for nausea or vomiting. 30 tablet 2  . potassium chloride SA (K-DUR) 20 MEQ tablet Take 1 tablet (20 mEq total) by mouth daily. (Patient taking differently: Take 20 mEq by mouth daily with lunch. ) 30 tablet 1  . prochlorperazine (COMPAZINE) 10 MG tablet Take 10 mg by mouth every 6 (six) hours as needed for nausea or vomiting.    . thyroid (ARMOUR) 90 MG tablet Take 90 mg by mouth daily before breakfast.     . vitamin B-12 (CYANOCOBALAMIN) 1000 MCG tablet Take 1,000 mcg by mouth daily.    Marland Kitchen ALPRAZolam (XANAX) 0.5 MG tablet Patient may take a tablet during the day may use up to 1-1/2 tablets at night to help with sleep 75 tablet 0    No results found for this or any previous visit (from the past 48 hour(s)). No results found.  Review of Systems  Constitutional: Negative for chills, fever and weight loss.  HENT: Negative for hearing loss and tinnitus.   Eyes: Negative for blurred vision and double vision.  Respiratory: Negative for cough and shortness of breath.   Cardiovascular: Negative for chest pain and palpitations.  Gastrointestinal: Negative for abdominal pain, nausea and vomiting.  Genitourinary: Negative for dysuria, frequency and urgency.  Musculoskeletal: Negative for myalgias.  Skin: Negative for itching and rash.  Neurological: Negative for dizziness and headaches.    Blood pressure (!) 136/92, pulse (!) 114, temperature 98.3 F (36.8 C), temperature source Oral, resp. rate 18, last menstrual period 02/16/2015, SpO2 100 %. Physical Exam  Constitutional: She is oriented to person, place, and time. She appears well-developed and well-nourished. No distress.  HENT:  Head: Normocephalic and atraumatic.  Eyes: Pupils are equal, round, and  reactive to light. Conjunctivae and EOM are normal.  Neck: Normal range of motion. Neck supple.  Cardiovascular: Normal rate and regular rhythm.  Respiratory: Effort normal. No respiratory distress.  GI: Soft. She exhibits no distension. There is no abdominal tenderness.  Musculoskeletal: Normal range of motion.  Neurological: She is alert and oriented to person, place, and time.  Skin: Skin is warm and dry.     Assessment/Plan The surgery and anatomy were described to the patient as well as the risks of surgery and the possible complications.  These include: Bleeding, deep abdominal infections and possible wound complications such as hernia and infection, damage to adjacent structures, leak of surgical connections, which can lead to other surgeries and possibly an ostomy, possible need for other procedures, such as abscess drains in radiology, possible  prolonged hospital stay, possible diarrhea from removal of part of the colon, possible constipation from narcotics, prolonged fatigue/weakness or appetite loss, possible early recurrence of of disease, possible complications of their medical problems such as heart disease or arrhythmias or lung problems, death (less than 1%). I believe the patient understands and wishes to proceed with the surgery.   Rosario Adie, MD [phone], 9:57 AM

## 2018-12-07 NOTE — Op Note (Signed)
12/07/2018  2:04 PM  PATIENT:  Samantha Reynolds  53 y.o. female  Patient Care Team: [name], MD as PCP - General (Family Medicine) Barrington Ellison, RN as [place] [facility]  PRE-OPERATIVE DIAGNOSIS:  rectal cancer, distal  POST-OPERATIVE DIAGNOSIS:  rectal cancer, distal  PROCEDURE:  XI ROBOTIC ASSISTED LOWER ANTERIOR RESECTION, SPLENIC FLEXURE MOBILIZATION, COLOANAL ANASTOMOSIS, INTRAOPERATIVE ASSESSMENT OF VASCULAR PERFUSION and DIVERTING LOOP ILEOSTOMY   Surgeon(s): Leighton Ruff, MD Michael Boston, MD  ASSISTANT: Dr Johney Maine   ANESTHESIA:   local and general  EBL: 3ml  Total I/O In: 1000 [I.V.:1000] Out: 300 [Urine:250; Blood:50]  Delay start of Pharmacological VTE agent (>24hrs) due to surgical blood loss or risk of bleeding:  no  DRAINS: (65F) Jackson-Pratt drain(s) with closed bulb suction in the pelvis   SPECIMEN:  Source of Specimen:  Sigmoid and Rectum, final distal margin  DISPOSITION OF SPECIMEN:  PATHOLOGY  COUNTS:  YES  PLAN OF CARE: Admit to inpatient   PATIENT DISPOSITION:  PACU - hemodynamically stable.  INDICATION:    53 year old female was stage IV rectal cancer.  She underwent liver ablation and DVT.  Total neoadjuvant lymphomatosis, chemoradiation.  She is now here for to refer to low anterior resection:  The anatomy & physiology of the digestive tract was discussed.  The pathophysiology was discussed.  Natural history risks without surgery was discussed.   I worked to give an overview of the disease and the frequent need to have multispecialty involvement.  I feel the risks of no intervention will lead to serious problems that outweigh the operative risks; therefore, I recommended a partial colectomy to remove the pathology.  Laparoscopic & open techniques were discussed.   Risks such as bleeding, infection, abscess, leak, reoperation, possible ostomy, hernia, heart attack, death, and other risks were discussed.  I noted a  good likelihood this will help address the problem.   Goals of post-operative recovery were discussed as well.    The patient expressed understanding & wished to proceed with surgery.  OR FINDINGS:   Patient had a posterior midline scar associated with hypertension approximately 1 cm from the anal verge  No obvious metastatic disease on visceral parietal peritoneum or liver.  The anastomosis rests 0 cm from the anal verge by rigid proctoscopy.  DESCRIPTION:   Informed consent was confirmed.  The patient underwent general anaesthesia without difficulty.  The patient was positioned appropriately.  VTE prevention in place.  The patient's abdomen was clipped, prepped, & draped in a sterile fashion.  Surgical timeout confirmed our plan.  The patient was positioned in reverse Trendelenburg.  Abdominal entry was gained using a varies needle.  Entry was clean.  I induced carbon dioxide insufflation.  An 32mm robotic port was placed in the upper quadrant.  Camera inspection revealed no injury.  Extra ports were carefully placed under direct laparoscopic visualization.  I laparoscopically reflected the greater omentum and the upper abdomen the small bowel in the upper abdomen. The patient was appropriately positioned and the robot was docked to the patient's left side.  Instruments were placed under direct visualization.    I mobilized the sigmoid colon off of the pelvic sidewall.  I scored the base of peritoneum of the right side of the mesentery of the left colon from the ligament of Treitz to the peritoneal reflection of the mid rectum.  The patient had minimal adhesions.  I elevated the sigmoid mesentery and enetered into the retro-mesenteric plane. We were  able to identify the left ureter and gonadal vessels. We kept those posterior within the retroperitoneum and elevated the left colon mesentery off that. I did isolated IMA pedicle but did not ligate it yet.  I continued distally and got into the  avascular plane posterior to the mesorectum. This allowed me to help mobilize the rectum as well by freeing the mesorectum off the sacrum.  I mobilized the peritoneal coverings towards the peritoneal reflection on both the right and left sides of the rectum.  I could see the right and left ureters and stayed away from them.    I skeletonized the inferior mesenteric artery pedicle.  I went down to its takeoff from the aorta.   I isolated the inferior mesenteric vein off of the ligament of Treitz just cephalad to that as well.  After confirming the left ureter was out of the way, I went ahead and ligated the inferior mesenteric artery pedicle with bipolar robotic vessel sealer ~2cm above its takeoff from the aorta.   I did ligate the inferior mesenteric vein in a similar fashion.  We ensured hemostasis. I skeletonized the mesorectum at the junction at the proximal rectum using blunt dissection & bipolar robotic vessel sealer.  I mobilized the left colon in a lateral to medial fashion off the line of Toldt up towards the splenic flexure to ensure good mobilization of the left colon to reach into the pelvis.  I divided the splenocolic attachments to mobilize the splenic flexure.  I continued to mobilize the peritoneum distally to the peritoneal reflection.  I then divided the peritoneal reflection using I bluntly dissected out the rectum, surrounding tissues and divided this with the vessel sealer down to the level of the pelvic floor posteriorly.  I then continued my dissection laterally on both sides.  I was then able to pull the rectum out of the pelvis and divide the remaining anterior attachments down to the level of the pelvic floor.  The vagina was separated from the rectum using blunt mobilization.  I then used a green load robotic stapler to divide the rectum and 2 fires of the pelvic floor.  Identified a portion of distal descending colon and confirmed that this did reach to the pelvic floor.  I then  divided the mesentery using the robotic vessel sealer up into this area.  I then had anesthesia inject firefly intravenously approximately 2.5 mL.  I evaluated her vascular perfusion.  There was good perfusion of the small bowel and subsequently the colon, to the level of my dissection into the mesentery.  This point the robot was undocked.  I enlarged my 12 mm port at her ileostomy site using electrocautery.  I would protector was placed.  The colon was then brought out through this wound protector and a pursestring device was placed on the previously dissected out the descending colon.  The colon was transected.  There was bleeding noted at the mucosal edge.  A 2-0 Prolene suture was placed through the pursestring device.  This was secured in place using interrupted 3-0 silk sutures.  A 29 mm EEA anvil was placed and the pursestring was tied tightly around this.  This was then placed back into the abdomen.  Under laparoscopic visualization we inserted the EEA into the anal canal.  An anastomosis was created.  There was no tension on the anastomosis.  Distal and proximal rings were intact.  The distal ring was sent as final distal margin to pathology.  I then  evaluated the abdomen laparoscopically.  Hemostasis was good.  A 19 [place] Blake drain was placed into the pelvis and brought out through the the right lower quadrant port site.  This was secured into place with a 2-0 nylon suture.  The omentum was then brought back over the abdominal contents.  The abdomen was desufflated.  I identified the terminal ileum and brought this out through the ileostomy site after the wound protector was removed.  I to clean gloves.  I then closed the port sites using interrupted 4-0 Vicryl subcuticular sutures and Dermabond.  Once this was complete, the ileostomy was matured in standard [place] fashion.  This was completed using interrupted 2-0 Vicryl sutures.  I confirmed patency of the ostomy using digital examination.   Sterile dressings were placed around the drain site.  An ostomy appliance was placed.  The patient was then awakened from anesthesia and sent to the postanesthesia care unit in stable condition.  All counts were correct per operating room staff.  An MD assistant was necessary for tissue manipulation, retraction and positioning due to the complexity of the case and hospital policies

## 2018-12-07 NOTE — Anesthesia Preprocedure Evaluation (Signed)
Anesthesia Evaluation  Patient identified by MRN, date of birth, ID band Patient awake    Reviewed: Allergy & Precautions, NPO status , Patient's Chart, lab work & pertinent test results  Airway Mallampati: I       Dental no notable dental hx. (+) Teeth Intact   Pulmonary former smoker,    Pulmonary exam normal breath sounds clear to auscultation       Cardiovascular negative cardio ROS Normal cardiovascular exam Rhythm:Regular Rate:Normal     Neuro/Psych PSYCHIATRIC DISORDERS Anxiety    GI/Hepatic   Endo/Other  Hypothyroidism   Renal/GU   negative genitourinary   Musculoskeletal negative musculoskeletal ROS (+)   Abdominal Normal abdominal exam  (+)   Peds  Hematology  (+) Blood dyscrasia, anemia ,   Anesthesia Other Findings   Reproductive/Obstetrics                             Anesthesia Physical Anesthesia Plan  ASA: II  Anesthesia Plan: General   Post-op Pain Management:    Induction:   PONV Risk Score and Plan: 4 or greater and Ondansetron, Dexamethasone and Midazolam  Airway Management Planned: Oral ETT  Additional Equipment: None  Intra-op Plan:   Post-operative Plan: Extubation in OR  Informed Consent: I have reviewed the patients History and Physical, chart, labs and discussed the procedure including the risks, benefits and alternatives for the proposed anesthesia with the patient or authorized representative who has indicated his/her understanding and acceptance.     Dental advisory given  Plan Discussed with: CRNA  Anesthesia Plan Comments:         Anesthesia Quick Evaluation

## 2018-12-07 NOTE — Transfer of Care (Signed)
Immediate Anesthesia Transfer of Care Note  Patient: Samantha Reynolds  Procedure(s) Performed: XI ROBOTIC ASSISTED LOWER ANTERIOR RESECTION, RIGID PROCTOSCOPY (N/A Abdomen) DIVERTING ILEOSTOMY (N/A Abdomen)  Patient Location: PACU  Anesthesia Type:General  Level of Consciousness: sedated  Airway & Oxygen Therapy: Patient Spontanous Breathing and Patient connected to face mask oxygen  Post-op Assessment: Report given to RN and Post -op Vital signs reviewed and stable  Post vital signs: Reviewed and stable  Last Vitals:  Vitals Value Taken Time  BP 120/77 12/07/18 1412  Temp    Pulse 86 12/07/18 1415  Resp 20 12/07/18 1415  SpO2 100 % 12/07/18 1415  Vitals shown include unvalidated device data.  Last Pain:  Vitals:   12/07/18 1006  TempSrc:   PainSc: 0-No pain         Complications: No apparent anesthesia complications

## 2018-12-07 NOTE — Anesthesia Procedure Notes (Signed)
Procedure Name: Intubation Date/Time: 12/07/2018 11:17 AM Performed by: Talbot Grumbling, CRNA Pre-anesthesia Checklist: Patient identified, Emergency Drugs available, Suction available and Patient being monitored Patient Re-evaluated:Patient Re-evaluated prior to induction Oxygen Delivery Method: Circle system utilized Preoxygenation: Pre-oxygenation with 100% oxygen Induction Type: IV induction Ventilation: Mask ventilation without difficulty Laryngoscope Size: Mac and 3 Grade View: Grade I Tube type: Oral Tube size: 7.0 mm Number of attempts: 1 Airway Equipment and Method: Stylet Placement Confirmation: ETT inserted through vocal cords under direct vision,  positive ETCO2 and breath sounds checked- equal and bilateral Secured at: 21 cm Tube secured with: Tape Dental Injury: Teeth and Oropharynx as per pre-operative assessment

## 2018-12-08 LAB — CBC
HCT: 33.1 % — ABNORMAL LOW (ref 36.0–46.0)
Hemoglobin: 11.2 g/dL — ABNORMAL LOW (ref 12.0–15.0)
MCH: 32.6 pg (ref 26.0–34.0)
MCHC: 33.8 g/dL (ref 30.0–36.0)
MCV: 96.2 fL (ref 80.0–100.0)
Platelets: 121 10*3/uL — ABNORMAL LOW (ref 150–400)
RBC: 3.44 MIL/uL — ABNORMAL LOW (ref 3.87–5.11)
RDW: 13.6 % (ref 11.5–15.5)
WBC: 6.9 10*3/uL (ref 4.0–10.5)
nRBC: 0 % (ref 0.0–0.2)

## 2018-12-08 LAB — BASIC METABOLIC PANEL
Anion gap: 6 (ref 5–15)
BUN: 11 mg/dL (ref 6–20)
CO2: 24 mmol/L (ref 22–32)
Calcium: 9 mg/dL (ref 8.9–10.3)
Chloride: 107 mmol/L (ref 98–111)
Creatinine, Ser: 0.91 mg/dL (ref 0.44–1.00)
GFR calc Af Amer: >60 mL/min (ref >60)
GFR calc non Af Amer: >60 mL/min (ref >60)
Glucose, Bld: 137 mg/dL — ABNORMAL HIGH (ref 70–99)
Potassium: 4.1 mmol/L (ref 3.5–5.1)
Sodium: 137 mmol/L (ref 135–145)

## 2018-12-08 NOTE — Plan of Care (Signed)
Patient lying in bed this morning; requesting medication for pain. No other needs expressed. Will continue to monitor.

## 2018-12-08 NOTE — Progress Notes (Signed)
Patient ambulating in hall w/ spouse. Complains of nausea and continued burning at ostomy. Previously medicated with tramadol, Dilaudid, and Zofran which worked temporarily but did not last long enough. Physician on call paged; awaiting callback. Will continue to monitor.

## 2018-12-08 NOTE — Progress Notes (Signed)
Patient complaining of ostomy "burning". On inspection, states it is the skin around the ostomy site that feels like it is burning. Assessed ostomy dressing and skin; ostomy appliance appears to be in place well with no leaking noted under dressing. Will continue to monitor site.

## 2018-12-08 NOTE — Progress Notes (Signed)
1 Day Post-Op Robotic LAR, diverting ostomy Subjective: Min pain, some soreness.  No nausea.  Has a sore throat, tolerating ice chips  Objective: Vital signs in last 24 hours: Temp:  [96 F (35.6 C)-98.6 F (37 C)] 98.6 F (37 C) (09/12 0547) Pulse Rate:  [79-114] 79 (09/12 0547) Resp:  [12-20] 18 (09/12 0547) BP: (94-136)/(69-95) 105/69 (09/12 0547) SpO2:  [98 %-100 %] 100 % (09/12 0547) Weight:  [71 kg] 71 kg (09/12 0500)   Intake/Output from previous day: 09/11 0701 - 09/12 0700 In: 3332.6 [P.O.:660; I.V.:2515.1; IV Piggyback:157.5] Out: 2335 [Urine:2075; Drains:210; Blood:50] Intake/Output this shift: Total I/O In: -  Out: 15 [Drains:15]   General appearance: alert and cooperative Cardio: RRR GI: soft, appropriately tender, non-distended  Incision: no significant erythema  Lab Results:  Recent Labs    12/08/18 0322  WBC 6.9  HGB 11.2*  HCT 33.1*  PLT 121*   BMET Recent Labs    12/08/18 0322  NA 137  K 4.1  CL 107  CO2 24  GLUCOSE 137*  BUN 11  CREATININE 0.91  CALCIUM 9.0   PT/INR No results for input(s): LABPROT, INR in the last 72 hours. ABG No results for input(s): PHART, HCO3 in the last 72 hours.  Invalid input(s): PCO2, PO2  MEDS, Scheduled . acetaminophen  1,000 mg Oral Q6H  . alvimopan  12 mg Oral BID  . enoxaparin (LOVENOX) injection  40 mg Subcutaneous Q24H  . feeding supplement  237 mL Oral BID BM  . gabapentin  300 mg Oral BID  . potassium chloride SA  20 mEq Oral Q lunch  . saccharomyces boulardii  250 mg Oral BID  . thyroid  90 mg Oral Q0600    Studies/Results: No results found.  Assessment: s/p Procedure(s): XI ROBOTIC ASSISTED LOWER ANTERIOR RESECTION, SPENIC FLEXURE IMMOBILIZATION, COLOANAL ANASTOMOSIS, RIGID PROCTOSCOPY DIVERTING LOOP ILEOSTOMY Patient Active Problem List   Diagnosis Date Noted  . Rectal cancer ([place]) 12/07/2018  . Abnormal EKG 03/28/2018  . Severe sepsis ([place]) 03/28/2018  . Sepsis due to  undetermined organism ([place]) 03/27/2018  . GERD (gastroesophageal reflux disease) 03/27/2018  . Anxiety 03/27/2018  . Lactic acidosis 03/27/2018  . Hypokalemia 03/27/2018  . Antineoplastic chemotherapy induced pancytopenia ([place]) 03/27/2018  . Hyperglycemia 03/27/2018  . 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[place]) 02/26/2018  . Hematochezia 01/24/2018  . 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 Hyperlipidemia 11/09/2015  . Sciatica of right side 06/26/2012  . History of thyroid cancer 04/02/2012  . 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 Pain in joint, ankle and foot 03/09/2011    Expected post op course  Plan: Cont fulls  Cont IVF's until tolerating a diet better Ambulate Ostomy teaching   LOS: 1 day     .Rosario Adie, [facility], [place]   12/08/2018 7:55 AM

## 2018-12-09 LAB — BASIC METABOLIC PANEL
Anion gap: 10 (ref 5–15)
BUN: 16 mg/dL (ref 6–20)
CO2: 21 mmol/L — ABNORMAL LOW (ref 22–32)
Calcium: 8.9 mg/dL (ref 8.9–10.3)
Chloride: 105 mmol/L (ref 98–111)
Creatinine, Ser: 0.91 mg/dL (ref 0.44–1.00)
GFR calc Af Amer: >60 mL/min (ref >60)
GFR calc non Af Amer: >60 mL/min (ref >60)
Glucose, Bld: 139 mg/dL — ABNORMAL HIGH (ref 70–99)
Potassium: 4.1 mmol/L (ref 3.5–5.1)
Sodium: 136 mmol/L (ref 135–145)

## 2018-12-09 LAB — CBC
HCT: 40.3 % (ref 36.0–46.0)
Hemoglobin: 13.8 g/dL (ref 12.0–15.0)
MCH: 33.1 pg (ref 26.0–34.0)
MCHC: 34.2 g/dL (ref 30.0–36.0)
MCV: 96.6 fL (ref 80.0–100.0)
Platelets: 131 10*3/uL — ABNORMAL LOW (ref 150–400)
RBC: 4.17 MIL/uL (ref 3.87–5.11)
RDW: 14.4 % (ref 11.5–15.5)
WBC: 9.6 10*3/uL (ref 4.0–10.5)
nRBC: 0 % (ref 0.0–0.2)

## 2018-12-09 MED ORDER — PROMETHAZINE HCL 25 MG/ML IJ SOLN
12.5000 mg | Freq: Four times a day (QID) | INTRAMUSCULAR | Status: DC | PRN
  Administered 2018-12-09 – 2018-12-10 (×3): 12.5 mg via INTRAVENOUS
  Filled 2018-12-09 (×3): qty 1

## 2018-12-09 MED ORDER — PROMETHAZINE HCL 25 MG/ML IJ SOLN
12.5000 mg | Freq: Four times a day (QID) | INTRAMUSCULAR | Status: DC | PRN
  Administered 2018-12-09 (×2): 12.5 mg via INTRAVENOUS
  Filled 2018-12-09 (×2): qty 1

## 2018-12-09 MED ORDER — SODIUM CHLORIDE 0.9 % IV BOLUS
1000.0000 mL | Freq: Once | INTRAVENOUS | Status: AC
  Administered 2018-12-09: 16:00:00 1000 mL via INTRAVENOUS

## 2018-12-09 MED ORDER — SCOPOLAMINE 1 MG/3DAYS TD PT72
1.0000 | MEDICATED_PATCH | TRANSDERMAL | Status: DC
  Administered 2018-12-09 – 2018-12-12 (×2): 1.5 mg via TRANSDERMAL
  Filled 2018-12-09 (×2): qty 1

## 2018-12-09 MED ORDER — PROMETHAZINE HCL 25 MG/ML IJ SOLN: 6.2500 mg | Freq: Four times a day (QID) | INTRAMUSCULAR | Status: DC | PRN

## 2018-12-09 NOTE — Progress Notes (Signed)
2 Days Post-Op Robotic LAR, diverting ostomy Subjective: Had a lot of nausea after removing the scop patch.    Objective: Vital signs in last 24 hours: Temp:  [97.7 F (36.5 C)-99.1 F (37.3 C)] 99.1 F (37.3 C) (09/13 0522) Pulse Rate:  [79-97] 97 (09/13 0522) Resp:  [16-18] 18 (09/13 0522) BP: (99-112)/(69-79) 112/79 (09/13 0522) SpO2:  [97 %-100 %] 100 % (09/13 0522) Weight:  [69.9 kg-71 kg] 69.9 kg (09/13 0500)   Intake/Output from previous day: 09/12 0701 - 09/13 0700 In: 1805.8 [P.O.:720; I.V.:1085.8] Out: 2805 [Urine:2300; Drains:105; Stool:400] Intake/Output this shift: No intake/output data recorded.  General appearance: alert and cooperative Cardio: RRR GI: soft, appropriately tender, non-distended  Incision: no significant erythema  Lab Results:  Recent Labs    12/08/18 0322 12/09/18 0407  WBC 6.9 9.6  HGB 11.2* 13.8  HCT 33.1* 40.3  PLT 121* 131*   BMET Recent Labs    12/08/18 0322 12/09/18 0407  NA 137 136  K 4.1 4.1  CL 107 105  CO2 24 21*  GLUCOSE 137* 139*  BUN 11 16  CREATININE 0.91 0.91  CALCIUM 9.0 8.9   PT/INR No results for input(s): LABPROT, INR in the last 72 hours. ABG No results for input(s): PHART, HCO3 in the last 72 hours.  Invalid input(s): PCO2, PO2  MEDS, Scheduled . acetaminophen  1,000 mg Oral Q6H  . alvimopan  12 mg Oral BID  . enoxaparin (LOVENOX) injection  40 mg Subcutaneous Q24H  . feeding supplement  237 mL Oral BID BM  . gabapentin  300 mg Oral BID  . saccharomyces boulardii  250 mg Oral BID  . scopolamine  1 patch Transdermal Q72H  . thyroid  90 mg Oral Q0600    Studies/Results: No results found.  Assessment: s/p Procedure(s): XI ROBOTIC ASSISTED LOWER ANTERIOR RESECTION, SPENIC FLEXURE IMMOBILIZATION, COLOANAL ANASTOMOSIS, RIGID PROCTOSCOPY DIVERTING LOOP ILEOSTOMY Patient Active Problem List   Diagnosis Date Noted  . Rectal cancer ([employer]) 12/07/2018  . Abnormal EKG 03/28/2018  . Severe sepsis  ([place]) 03/28/2018  . Sepsis due to undetermined organism ([place]) 03/27/2018  . GERD (gastroesophageal reflux disease) 03/27/2018  . Anxiety 03/27/2018  . Lactic acidosis 03/27/2018  . Hypokalemia 03/27/2018  . Antineoplastic chemotherapy induced pancytopenia ([place]) 03/27/2018  . Hyperglycemia 03/27/2018  . 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[place]) 02/26/2018  . Hematochezia 01/24/2018  . 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 Hyperlipidemia 11/09/2015  . Sciatica of right side 06/26/2012  . History of thyroid cancer 04/02/2012  . 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 Pain in joint, ankle and foot 03/09/2011    Expected post op course  Plan: Cont fulls  Increase IVF's until tolerating a diet better Resume scop patch Ambulate Ostomy teaching   LOS: 2 days     .Rosario Adie, [facility], [place]   12/09/2018 7:57 AM

## 2018-12-09 NOTE — Plan of Care (Signed)
Patient lying in bed this morning; complaining of continued nausea. Antiemetics have only been working briefly and not lasting long. IV Zofran recently given. Will continue to monitor.

## 2018-12-09 NOTE — Progress Notes (Signed)
Yellow MEWS protocol inititated; HR 105; BP 97/76. Patient c/o dizziness when first sitting/up but gets better as she sits up. C/O nausea and pain throughout the day; has received prn tramadol, dilaudid, zofran, and phenergan. Scopolamine patch applied this morning. Has had no urine output since foley removal this morning. Dr. Marcello Moores notified; new order received for NS bolus x1. Will continue to monitor.

## 2018-12-09 NOTE — Progress Notes (Signed)
Initial Nutrition Assessment RD working remotely.  DOCUMENTATION CODES:   Not applicable  INTERVENTION:  Continue Ensure Surgery po BID, each supplement provides 330 kcal and 18 grams of protein.  Once patient completes Ensure Surgery, recommend changing to Ensure Max Protein po BID as this is most similar to the ONS she drinks at home. Each bottle of Ensure Max Protein provides 150 kcal and 30 grams of protein.  NUTRITION DIAGNOSIS:   Increased nutrient needs related to catabolic illness(stage IV rectal cancer) as evidenced by estimated needs.  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   Called patient's room phone and husband answered. He reported that patient was sleeping so he provided nutrition/weight history. He reports she has had a decreased appetite and intake since she started chemotherapy in December. She still attempts to eat 3 meals per day but she has small amounts at meals. She does drink Premier Protein at home. He reports that yesterday she did fairly well with the full liquids and was able to have some liquids at all 3 meals but then she had emesis last night and has not been able to tolerate liquids as well today. She is having mainly ice chips today. She did drink the Ensure Surgery yesterday but has not had any yet today.  He reports her UBW was 190 lbs and she lost a significant amount of weight, though rate of weight loss has slowed down now. Per chart she was 190.125 lbs  on 01/02/2018. She was 70.8 kg (155.76 lbs) on 09/03/2018. That is a weight loss of 34.4 lbs (18.1% body weight) over 8 months, which is significant for time frame. She has remained fairly weight-stable since then and is now 69.9 kg (154.1 lbs).   Medications reviewed and include: Ensure Surgery BID, gabapentin, Florastor 250 mg BID, Armour, D5-1/2NS with KCl 20 mEq/L at 75 mL/hr.  Labs reviewed: CO2 21.  Patient is at risk for malnutrition but unable to determine if she meets criteria without completing NFPE.  NUTRITION - FOCUSED PHYSICAL EXAM:  Unable to complete at this time.  Diet Order:   Diet Order            Diet full liquid Room service appropriate? Yes; Fluid consistency: Thin  Diet effective now             EDUCATION NEEDS:   No education needs have been identified at this time  Skin:  Skin Assessment: Skin Integrity Issues: Skin Integrity Issues:: Incisions Incisions: closed incision to abdomen  Last BM:  12/09/2018 - 325 mL output from ileostomy  Height:   Ht Readings from Last 1 Encounters:  12/08/18 5' 5.6" (1.666 m)   Weight:   Wt Readings from Last 1 Encounters:  12/09/18 69.9 kg   Ideal Body Weight:  58.2 kg  BMI:  Body mass index is 25.18 kg/m.  Estimated Nutritional Needs:   Kcal:  [id number] (MSJ x 1.3-1.5)  Protein:  90-105 grams (1.3-1.5 grams/kg)  Fluid:  2-2.4 L/day  Davarius Ridener, MS, RD, LDN Office: [phone] Pager: [phone] After Hours/Weekend Pager: [phone]  

## 2018-12-10 ENCOUNTER — Inpatient Hospital Stay (HOSPITAL_COMMUNITY): Payer: 59

## 2018-12-10 ENCOUNTER — Encounter (HOSPITAL_COMMUNITY): Payer: Self-pay | Admitting: General Surgery

## 2018-12-10 LAB — BASIC METABOLIC PANEL
Anion gap: 8 (ref 5–15)
BUN: 21 mg/dL — ABNORMAL HIGH (ref 6–20)
CO2: 22 mmol/L (ref 22–32)
Calcium: 8.2 mg/dL — ABNORMAL LOW (ref 8.9–10.3)
Chloride: 105 mmol/L (ref 98–111)
Creatinine, Ser: 0.91 mg/dL (ref 0.44–1.00)
GFR calc Af Amer: >60 mL/min (ref >60)
GFR calc non Af Amer: >60 mL/min (ref >60)
Glucose, Bld: 127 mg/dL — ABNORMAL HIGH (ref 70–99)
Potassium: 4.3 mmol/L (ref 3.5–5.1)
Sodium: 135 mmol/L (ref 135–145)

## 2018-12-10 LAB — CBC
HCT: 35.6 % — ABNORMAL LOW (ref 36.0–46.0)
Hemoglobin: 12 g/dL (ref 12.0–15.0)
MCH: 32.6 pg (ref 26.0–34.0)
MCHC: 33.7 g/dL (ref 30.0–36.0)
MCV: 96.7 fL (ref 80.0–100.0)
Platelets: 129 10*3/uL — ABNORMAL LOW (ref 150–400)
RBC: 3.68 MIL/uL — ABNORMAL LOW (ref 3.87–5.11)
RDW: 14.1 % (ref 11.5–15.5)
WBC: 8.1 10*3/uL (ref 4.0–10.5)
nRBC: 0 % (ref 0.0–0.2)

## 2018-12-10 MED ORDER — OXYCODONE HCL 5 MG PO TABS: 5.0000 mg | ORAL_TABLET | ORAL | Status: DC | PRN

## 2018-12-10 MED ORDER — PROCHLORPERAZINE MALEATE 10 MG PO TABS
5.0000 mg | ORAL_TABLET | Freq: Four times a day (QID) | ORAL | Status: DC | PRN
  Administered 2018-12-10 – 2018-12-11 (×2): 5 mg via ORAL
  Filled 2018-12-10 (×2): qty 1

## 2018-12-10 MED ORDER — TRAMADOL HCL 50 MG PO TABS: 50.0000 mg | ORAL_TABLET | Freq: Four times a day (QID) | ORAL | Status: DC | PRN

## 2018-12-10 MED ORDER — PROMETHAZINE HCL 25 MG/ML IJ SOLN
12.5000 mg | INTRAMUSCULAR | Status: DC | PRN
  Administered 2018-12-10 – 2018-12-11 (×5): 12.5 mg via INTRAVENOUS
  Filled 2018-12-10 (×6): qty 1

## 2018-12-10 MED ORDER — SODIUM CHLORIDE 0.9 % IV BOLUS
1000.0000 mL | Freq: Once | INTRAVENOUS | Status: AC
  Administered 2018-12-10: 1000 mL via INTRAVENOUS

## 2018-12-10 NOTE — Progress Notes (Addendum)
3 Days Post-Op Robotic LAR, diverting ostomy Subjective: Still has a lot of nausea, some dizziness when she stands.  Bolus given yesterday for tachycardia and low UOP.  Pain steady, controlled with dilaudid  Objective: Vital signs in last 24 hours: Temp:  [97.7 F (36.5 C)-99.4 F (37.4 C)] 98.7 F (37.1 C) (09/14 0621) Pulse Rate:  [92-105] 102 (09/14 0621) Resp:  [16-18] 18 (09/14 0621) BP: (94-105)/(73-82) 102/82 (09/14 0621) SpO2:  [97 %-99 %] 99 % (09/14 0621) Weight:  [69 kg] 69 kg (09/14 0500)   Intake/Output from previous day: 09/13 0701 - 09/14 0700 In: 2889.3 [P.O.:230; I.V.:1658.7; IV Piggyback:1000.6] Out: 1312 [Urine:300; Drains:337; Stool:275] Intake/Output this shift: No intake/output data recorded.  General appearance: alert and cooperative GI: soft, appropriately tender, non-distended Ostomy" edematous, beefy red Incision: no significant erythema  Lab Results:  Recent Labs    12/09/18 0407 12/10/18 0441  WBC 9.6 8.1  HGB 13.8 12.0  HCT 40.3 35.6*  PLT 131* 129*   BMET Recent Labs    12/09/18 0407 12/10/18 0441  NA 136 135  K 4.1 4.3  CL 105 105  CO2 21* 22  GLUCOSE 139* 127*  BUN 16 21*  CREATININE 0.91 0.91  CALCIUM 8.9 8.2*   PT/INR No results for input(s): LABPROT, INR in the last 72 hours. ABG No results for input(s): PHART, HCO3 in the last 72 hours.  Invalid input(s): PCO2, PO2  MEDS, Scheduled . acetaminophen  1,000 mg Oral Q6H  . enoxaparin (LOVENOX) injection  40 mg Subcutaneous Q24H  . feeding supplement  237 mL Oral BID BM  . gabapentin  300 mg Oral BID  . saccharomyces boulardii  250 mg Oral BID  . scopolamine  1 patch Transdermal Q72H  . thyroid  90 mg Oral Q0600    Studies/Results: No results found.  Assessment: s/p Procedure(s): XI ROBOTIC ASSISTED LOWER ANTERIOR RESECTION, SPENIC FLEXURE IMMOBILIZATION, COLOANAL ANASTOMOSIS, RIGID PROCTOSCOPY DIVERTING LOOP ILEOSTOMY Patient Active Problem List   Diagnosis  Date Noted  . Rectal cancer ([place]) 12/07/2018  . Abnormal EKG 03/28/2018  . Severe sepsis ([place]) 03/28/2018  . Sepsis due to undetermined organism ([place]) 03/27/2018  . GERD (gastroesophageal reflux disease) 03/27/2018  . Anxiety 03/27/2018  . Lactic acidosis 03/27/2018  . Hypokalemia 03/27/2018  . Antineoplastic chemotherapy induced pancytopenia ([place]) 03/27/2018  . Hyperglycemia 03/27/2018  . 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[place]) 02/26/2018  . Hematochezia 01/24/2018  . 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 Hyperlipidemia 11/09/2015  . Sciatica of right side 06/26/2012  . History of thyroid cancer 04/02/2012  . 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 Pain in joint, ankle and foot 03/09/2011    Expected post op course  Plan: Cont fulls as tolerated Will get AXR to eval for extent of ileus Increase IVF's until tolerating a diet better Bolus another 1L.  Still seems dehydrated on exam and having low UOP Cont scop patch Ambulate Ostomy teaching Recheck labs in AM   LOS: 3 days     .Rosario Adie, [facility], [place]   12/10/2018 7:41 AM

## 2018-12-10 NOTE — Progress Notes (Signed)
   12/10/18 2209  Vitals  Temp 99.4 F (37.4 C)  Temp Source Oral  BP 100/74  MAP (mmHg) 84  BP Location Right Arm  BP Method Automatic  Patient Position (if appropriate) Sitting  Pulse Rate (!) 105  Resp 16  Oxygen Therapy  SpO2 96 %  O2 Device Room Air   Patient has yellow MEWS due to pulse of 105 and BP 100/74, MD on call notified, protocol taken, this is patient's baseline since earlier in the day, is not an acute change. Patient is being monitored and patient is currently stable. Dawson Bills, RN

## 2018-12-10 NOTE — Anesthesia Postprocedure Evaluation (Signed)
Anesthesia Post Note  Patient: Samantha Reynolds  Procedure(s) Performed: XI ROBOTIC ASSISTED LOWER ANTERIOR RESECTION, SPENIC FLEXURE IMMOBILIZATION, COLOANAL ANASTOMOSIS, RIGID PROCTOSCOPY (N/A Abdomen) DIVERTING LOOP ILEOSTOMY (N/A Abdomen)     Patient location during evaluation: PACU Anesthesia Type: General Level of consciousness: sedated Pain management: pain level controlled Vital Signs Assessment: post-procedure vital signs reviewed and stable Respiratory status: spontaneous breathing Cardiovascular status: stable Postop Assessment: no apparent nausea or vomiting Anesthetic complications: no    Last Vitals:  Vitals:   12/10/18 0621 12/10/18 1016  BP: 102/82 105/72  Pulse: (!) 102 94  Resp: 18 16  Temp: 37.1 C 36.5 C  SpO2: 99% 100%    Last Pain:  Vitals:   12/10/18 1016  TempSrc: Oral  PainSc:    Pain Goal: Patients Stated Pain Goal: 2 (12/10/18 0825)                 Huston Foley

## 2018-12-11 ENCOUNTER — Inpatient Hospital Stay (HOSPITAL_COMMUNITY): Payer: 59

## 2018-12-11 ENCOUNTER — Encounter (HOSPITAL_COMMUNITY)
Admission: RE | Disposition: A | Discharge status: Home Health | Payer: Self-pay | Source: Home / Self Care | Attending: General Surgery

## 2018-12-11 ENCOUNTER — Other Ambulatory Visit (HOSPITAL_COMMUNITY): Payer: 59

## 2018-12-11 ENCOUNTER — Inpatient Hospital Stay (HOSPITAL_COMMUNITY): Payer: 59 | Admitting: Anesthesiology

## 2018-12-11 ENCOUNTER — Encounter (HOSPITAL_COMMUNITY): Payer: Self-pay | Admitting: Radiology

## 2018-12-11 HISTORY — PX: LAPAROSCOPY: SHX197

## 2018-12-11 LAB — CBC
HCT: 31.5 % — ABNORMAL LOW (ref 36.0–46.0)
Hemoglobin: 10.8 g/dL — ABNORMAL LOW (ref 12.0–15.0)
MCH: 33.1 pg (ref 26.0–34.0)
MCHC: 34.3 g/dL (ref 30.0–36.0)
MCV: 96.6 fL (ref 80.0–100.0)
Platelets: 128 10*3/uL — ABNORMAL LOW (ref 150–400)
RBC: 3.26 MIL/uL — ABNORMAL LOW (ref 3.87–5.11)
RDW: 14 % (ref 11.5–15.5)
WBC: 6 10*3/uL (ref 4.0–10.5)
nRBC: 0 % (ref 0.0–0.2)

## 2018-12-11 LAB — COMPREHENSIVE METABOLIC PANEL
ALT: 10 U/L (ref 0–44)
AST: 15 U/L (ref 15–41)
Albumin: 2.6 g/dL — ABNORMAL LOW (ref 3.5–5.0)
Alkaline Phosphatase: 59 U/L (ref 38–126)
Anion gap: 9 (ref 5–15)
BUN: 13 mg/dL (ref 6–20)
CO2: 21 mmol/L — ABNORMAL LOW (ref 22–32)
Calcium: 7.7 mg/dL — ABNORMAL LOW (ref 8.9–10.3)
Chloride: 102 mmol/L (ref 98–111)
Creatinine, Ser: 0.74 mg/dL (ref 0.44–1.00)
GFR calc Af Amer: >60 mL/min (ref >60)
GFR calc non Af Amer: >60 mL/min (ref >60)
Glucose, Bld: 116 mg/dL — ABNORMAL HIGH (ref 70–99)
Potassium: 4.2 mmol/L (ref 3.5–5.1)
Sodium: 132 mmol/L — ABNORMAL LOW (ref 135–145)
Total Bilirubin: 3.1 mg/dL — ABNORMAL HIGH (ref 0.3–1.2)
Total Protein: 5.2 g/dL — ABNORMAL LOW (ref 6.5–8.1)

## 2018-12-11 LAB — LACTIC ACID, PLASMA: Lactic Acid, Venous: 1 mmol/L (ref 0.5–1.9)

## 2018-12-11 SURGERY — LAPAROSCOPY, DIAGNOSTIC
Anesthesia: General | Site: Abdomen

## 2018-12-11 MED ORDER — CLINDAMYCIN PHOSPHATE 900 MG/50ML IV SOLN
900.0000 mg | INTRAVENOUS | Status: AC
  Administered 2018-12-11: 900 mg via INTRAVENOUS
  Filled 2018-12-11: qty 50

## 2018-12-11 MED ORDER — ACETAMINOPHEN 10 MG/ML IV SOLN
INTRAVENOUS | Status: DC | PRN
  Administered 2018-12-11: 1000 mg via INTRAVENOUS

## 2018-12-11 MED ORDER — HYDROMORPHONE HCL 1 MG/ML IJ SOLN: 0.2500 mg | INTRAMUSCULAR | Status: DC | PRN

## 2018-12-11 MED ORDER — SODIUM CHLORIDE (PF) 0.9 % IJ SOLN
INTRAMUSCULAR | Status: AC
  Filled 2018-12-11: qty 50

## 2018-12-11 MED ORDER — DEXAMETHASONE SODIUM PHOSPHATE 10 MG/ML IJ SOLN
INTRAMUSCULAR | Status: AC
  Filled 2018-12-11: qty 1

## 2018-12-11 MED ORDER — PROPOFOL 10 MG/ML IV BOLUS
INTRAVENOUS | Status: DC | PRN
  Administered 2018-12-11: 110 mg via INTRAVENOUS

## 2018-12-11 MED ORDER — ONDANSETRON HCL 4 MG/2ML IJ SOLN
INTRAMUSCULAR | Status: AC
  Filled 2018-12-11: qty 2

## 2018-12-11 MED ORDER — 0.9 % SODIUM CHLORIDE (POUR BTL) OPTIME
TOPICAL | Status: DC | PRN
  Administered 2018-12-11: 1000 mL

## 2018-12-11 MED ORDER — MIDAZOLAM HCL 5 MG/5ML IJ SOLN
INTRAMUSCULAR | Status: DC | PRN
  Administered 2018-12-11: 1 mg via INTRAVENOUS

## 2018-12-11 MED ORDER — FENTANYL CITRATE (PF) 250 MCG/5ML IJ SOLN
INTRAMUSCULAR | Status: DC | PRN
  Administered 2018-12-11: 25 ug via INTRAVENOUS
  Administered 2018-12-11: 50 ug via INTRAVENOUS
  Administered 2018-12-11: 25 ug via INTRAVENOUS

## 2018-12-11 MED ORDER — FENTANYL CITRATE (PF) 250 MCG/5ML IJ SOLN
INTRAMUSCULAR | Status: AC
  Filled 2018-12-11: qty 5

## 2018-12-11 MED ORDER — LACTATED RINGERS IV SOLN
INTRAVENOUS | Status: DC
  Administered 2018-12-11 (×2): via INTRAVENOUS

## 2018-12-11 MED ORDER — LIDOCAINE 2% (20 MG/ML) 5 ML SYRINGE
INTRAMUSCULAR | Status: DC | PRN
  Administered 2018-12-11: 60 mg via INTRAVENOUS

## 2018-12-11 MED ORDER — SUCCINYLCHOLINE CHLORIDE 200 MG/10ML IV SOSY
PREFILLED_SYRINGE | INTRAVENOUS | Status: DC | PRN
  Administered 2018-12-11: 80 mg via INTRAVENOUS

## 2018-12-11 MED ORDER — ROCURONIUM BROMIDE 10 MG/ML (PF) SYRINGE
PREFILLED_SYRINGE | INTRAVENOUS | Status: AC
  Filled 2018-12-11: qty 10

## 2018-12-11 MED ORDER — ACETAMINOPHEN 10 MG/ML IV SOLN
INTRAVENOUS | Status: AC
  Filled 2018-12-11: qty 100

## 2018-12-11 MED ORDER — ONDANSETRON HCL 4 MG/2ML IJ SOLN
INTRAMUSCULAR | Status: DC | PRN
  Administered 2018-12-11: 4 mg via INTRAVENOUS

## 2018-12-11 MED ORDER — HYDROMORPHONE HCL 1 MG/ML IJ SOLN
INTRAMUSCULAR | Status: AC
  Filled 2018-12-11: qty 1

## 2018-12-11 MED ORDER — BUPIVACAINE LIPOSOME 1.3 % IJ SUSP
20.0000 mL | Freq: Once | INTRAMUSCULAR | Status: AC
  Administered 2018-12-11: 20 mL
  Filled 2018-12-11: qty 20

## 2018-12-11 MED ORDER — LIDOCAINE 2% (20 MG/ML) 5 ML SYRINGE
INTRAMUSCULAR | Status: AC
  Filled 2018-12-11: qty 5

## 2018-12-11 MED ORDER — HYDROMORPHONE HCL 1 MG/ML IJ SOLN
0.2500 mg | INTRAMUSCULAR | Status: DC | PRN
  Administered 2018-12-11 (×3): 0.5 mg via INTRAVENOUS

## 2018-12-11 MED ORDER — DEXAMETHASONE SODIUM PHOSPHATE 10 MG/ML IJ SOLN
INTRAMUSCULAR | Status: DC | PRN
  Administered 2018-12-11: 5 mg via INTRAVENOUS

## 2018-12-11 MED ORDER — SUGAMMADEX SODIUM 200 MG/2ML IV SOLN
INTRAVENOUS | Status: DC | PRN
  Administered 2018-12-11: 150 mg via INTRAVENOUS

## 2018-12-11 MED ORDER — PROPOFOL 10 MG/ML IV BOLUS
INTRAVENOUS | Status: AC
  Filled 2018-12-11: qty 20

## 2018-12-11 MED ORDER — LACTATED RINGERS IR SOLN
Status: DC | PRN
  Administered 2018-12-11: 3000 mL

## 2018-12-11 MED ORDER — MIDAZOLAM HCL 2 MG/2ML IJ SOLN
INTRAMUSCULAR | Status: AC
  Filled 2018-12-11: qty 2

## 2018-12-11 MED ORDER — ROCURONIUM BROMIDE 10 MG/ML (PF) SYRINGE
PREFILLED_SYRINGE | INTRAVENOUS | Status: DC | PRN
  Administered 2018-12-11: 60 mg via INTRAVENOUS

## 2018-12-11 MED ORDER — IOHEXOL 300 MG/ML  SOLN
100.0000 mL | Freq: Once | INTRAMUSCULAR | Status: AC | PRN
  Administered 2018-12-11: 10:00:00 100 mL via INTRAVENOUS

## 2018-12-11 SURGICAL SUPPLY — 55 items
ADH SKN CLS APL DERMABOND .7 (GAUZE/BANDAGES/DRESSINGS) ×1
APPLIER CLIP 5 13 M/L LIGAMAX5 (MISCELLANEOUS)
APPLIER CLIP ROT 10 11.4 M/L (STAPLE)
APR CLP MED LRG 11.4X10 (STAPLE)
APR CLP MED LRG 5 ANG JAW (MISCELLANEOUS)
BLADE EXTENDED COATED 6.5IN (ELECTRODE) IMPLANT
CLIP APPLIE 5 13 M/L LIGAMAX5 (MISCELLANEOUS) IMPLANT
CLIP APPLIE ROT 10 11.4 M/L (STAPLE) IMPLANT
COVER MAYO STAND STRL (DRAPES) ×2 IMPLANT
COVER WAND RF STERILE (DRAPES) IMPLANT
DECANTER SPIKE VIAL GLASS SM (MISCELLANEOUS) ×2 IMPLANT
DERMABOND ADVANCED (GAUZE/BANDAGES/DRESSINGS) ×1
DERMABOND ADVANCED .7 DNX12 (GAUZE/BANDAGES/DRESSINGS) IMPLANT
DRAIN CHANNEL 15F RND FF 3/16 (WOUND CARE) ×1 IMPLANT
DRSG TEGADERM 2-3/8X2-3/4 SM (GAUZE/BANDAGES/DRESSINGS) ×2 IMPLANT
DRSG TEGADERM 4X4.75 (GAUZE/BANDAGES/DRESSINGS) ×1 IMPLANT
ELECT PENCIL ROCKER SW 15FT (MISCELLANEOUS) ×1 IMPLANT
ELECT REM PT RETURN 15FT ADLT (MISCELLANEOUS) ×2 IMPLANT
EVACUATOR SILICONE 100CC (DRAIN) ×1 IMPLANT
GAUZE SPONGE 4X4 12PLY STRL (GAUZE/BANDAGES/DRESSINGS) ×2 IMPLANT
GLOVE BIO SURGEON STRL SZ 6.5 (GLOVE) ×4 IMPLANT
GLOVE BIOGEL PI IND STRL 7.0 (GLOVE) ×1 IMPLANT
GLOVE BIOGEL PI INDICATOR 7.0 (GLOVE) ×1
GOWN STRL REUS W/TWL XL LVL3 (GOWN DISPOSABLE) ×4 IMPLANT
HANDLE SUCTION POOLE (INSTRUMENTS) IMPLANT
IRRIG SUCT STRYKERFLOW 2 WTIP (MISCELLANEOUS) ×2
IRRIGATION SUCT STRKRFLW 2 WTP (MISCELLANEOUS) ×1 IMPLANT
KIT BASIN OR (CUSTOM PROCEDURE TRAY) ×1 IMPLANT
KIT TURNOVER KIT A (KITS) IMPLANT
PORT LAP GEL ALEXIS MED 5-9CM (MISCELLANEOUS) ×1 IMPLANT
SEALER TISSUE G2 STRG ARTC 35C (ENDOMECHANICALS) IMPLANT
SET TUBE SMOKE EVAC HIGH FLOW (TUBING) ×1 IMPLANT
SLEEVE XCEL OPT CAN 5 100 (ENDOMECHANICALS) ×2 IMPLANT
SPONGE LAP 18X18 RF (DISPOSABLE) IMPLANT
STAPLER PROXIMATE 75MM BLUE (STAPLE) ×1 IMPLANT
STAPLER VISISTAT 35W (STAPLE) IMPLANT
SUCTION POOLE HANDLE (INSTRUMENTS) ×2
SUT ETHILON 2 0 PS N (SUTURE) ×1 IMPLANT
SUT NOVA 1 T20/GS 25DT (SUTURE) IMPLANT
SUT PDS AB 1 TP1 96 (SUTURE) IMPLANT
SUT PROLENE 2 0 KS (SUTURE) IMPLANT
SUT PROLENE 2 0 SH DA (SUTURE) IMPLANT
SUT SILK 2 0 (SUTURE) ×2
SUT SILK 2 0 SH CR/8 (SUTURE) ×2 IMPLANT
SUT SILK 2-0 18XBRD TIE 12 (SUTURE) ×1 IMPLANT
SUT SILK 3 0 (SUTURE) ×2
SUT SILK 3 0 SH CR/8 (SUTURE) ×1 IMPLANT
SUT SILK 3-0 18XBRD TIE 12 (SUTURE) ×1 IMPLANT
SUT VIC AB 2-0 SH 18 (SUTURE) ×3 IMPLANT
TOWEL OR 17X26 10 PK STRL BLUE (TOWEL DISPOSABLE) ×1 IMPLANT
TOWEL OR NON WOVEN STRL DISP B (DISPOSABLE) ×2 IMPLANT
TRAY LAPAROSCOPIC (CUSTOM PROCEDURE TRAY) ×1 IMPLANT
TROCAR BLADELESS OPT 5 100 (ENDOMECHANICALS) ×2 IMPLANT
TROCAR XCEL BLUNT TIP 100MML (ENDOMECHANICALS) IMPLANT
TROCAR XCEL NON-BLD 11X100MML (ENDOMECHANICALS) IMPLANT

## 2018-12-11 NOTE — Transfer of Care (Signed)
Immediate Anesthesia Transfer of Care Note  Patient: Samantha Reynolds  Procedure(s) Performed: LAPAROSCOPY  ILEOSTOMY REVISION AND ABDOMINAL WASHOUT (N/A Abdomen)  Patient Location: PACU  Anesthesia Type:General  Level of Consciousness: awake, alert  and oriented  Airway & Oxygen Therapy: Patient Spontanous Breathing and Patient connected to face mask oxygen  Post-op Assessment: Report given to RN and Post -op Vital signs reviewed and stable  Post vital signs: Reviewed and stable  Last Vitals:  Vitals Value Taken Time  BP 122/85 12/11/18 1815  Temp 36.8 C 12/11/18 1807  Pulse 96 12/11/18 1815  Resp 16 12/11/18 1815  SpO2 100 % 12/11/18 1815  Vitals shown include unvalidated device data.  Last Pain:  Vitals:   12/11/18 1807  TempSrc:   PainSc: (P) 10-Worst pain ever      Patients Stated Pain Goal: 3 ([id number])  Complications: No apparent anesthesia complications

## 2018-12-11 NOTE — Op Note (Signed)
12/11/2018  5:29 PM  PATIENT:  Samantha Reynolds  53 y.o. female  Patient Care Team: [name], MD as PCP - General (Family Medicine) Barrington Ellison, RN as [facility]  PRE-OPERATIVE DIAGNOSIS:  ostomy complication  POST-OPERATIVE DIAGNOSIS:  Superficial ostomy leak  PROCEDURE:  DIAGNOSTIC LAPAROSCOPY,  ILEOSTOMY REVISION AND ABDOMINAL WASHOUT    Surgeon(s): Leighton Ruff, MD  ASSISTANT: none   ANESTHESIA:   local and general  EBL: 65ml Total I/O In: 1624.2 [I.V.:1624.2] Out: 950 [Urine:900; Drains:50]  DRAINS: (38F) Jackson-Pratt drain(s) with closed bulb suction in the peristomal space   SPECIMEN:  No Specimen  DISPOSITION OF SPECIMEN:  N/A  COUNTS:  YES  PLAN OF CARE: Pt already admitted  PATIENT DISPOSITION:  PACU - hemodynamically stable.  INDICATION: 53 year old female postop day 4 from low anterior resection diverting ileostomy.  She developed signs of inflammatory response that were not consistent with normal postoperative recovery.  A CT scan was obtained and this showed a parastomal infection.  She was taken to the operating room later that day for evaluation and ileostomy revision.   OR FINDINGS: Small opening in the posterior wall of the ileostomy leaking bilious contents into the subcutaneous tissue.  No leak of bile or ostomy contents into the abdomen.  DESCRIPTION: the patient was identified in the preoperative holding area and taken to the OR where they were laid supine on the operating room table.  General anesthesia was induced without difficulty. SCDs were also noted to be in place prior to the initiation of anesthesia.  The patient was then prepped and draped in the usual sterile fashion.   A surgical timeout was performed indicating the correct patient, procedure, positioning and need for preoperative antibiotics.   I began to remove the sutures around the ostomy.  The peristomal space had bilious content within it.   This was aspirated.  Identified a small defect in the posterior wall of the ileostomy site near the red rubber catheter.  It appeared to be due to a stitch that had pulled through.  I divided the ileostomy proximal to this using electrocautery.  The mesentery was divided using a hemostat and 2-0 silk sutures.  I placed a suture on the proximal and distal limbs which were now separated and placed them back into the abdomen.  I cleared the abdominal wall contents and placed a Alexis wound protector inside the abdomen.  I evaluated the abdomen for signs of bleeding or bile staining.  I did not see any.  I placed a 5 mm trocar through the previous left upper quadrant ostomy site under direct visualization.  I checked all 4 quadrants and irrigated in all 4 quadrants.  There was no sign of bilious ascites.  The intra-abdominal space was clean.  Given the distended bowel and no sign of bilious drainage within the abdomen, I decided not to run the bowel as I did not want to risk injuring any other structures.  I removed the [place]on wound protector and irrigated the wound.  I debrided any necrotic tissue.  I brought up the 2 limbs of the ostomy.  I placed a 15 [place] Blake drain in the peristomal space and brought this out posterior to the ostomy.  This was secured with a 2-0 nylon suture.  I then matured the proximal limb in standard Brooke fashion to the dermal layer of the skin.  I then sutured the distal limb to the wall of the everted proximal limb  and then the other side was sutured to the posterior dermal layer.  Once this was completed an ostomy appliance was placed.  Dressings were placed on the JP drains.

## 2018-12-11 NOTE — Progress Notes (Signed)
4 Days Post-Op Robotic LAR, diverting ostomy Subjective: Still has a lot of nausea, vomiting some.  Bolus x2L given yesterday for tachycardia and low UOP.  UOP improved but HR has not.  Pain steady, controlled with dilaudid  Objective: Vital signs in last 24 hours: Temp:  [97.7 F (36.5 C)-99.4 F (37.4 C)] 99.4 F (37.4 C) (09/15 0513) Pulse Rate:  [94-106] 100 (09/15 0513) Resp:  [14-17] 14 (09/15 0513) BP: (92-109)/(72-85) 106/79 (09/15 0513) SpO2:  [96 %-100 %] 99 % (09/15 0513) Weight:  [72.4 kg] 72.4 kg (09/15 0513)   Intake/Output from previous day: 09/14 0701 - 09/15 0700 In: 3041.7 [P.O.:240; I.V.:2801.7] Out: 2295 [Urine:1700; Drains:525; Stool:70] Intake/Output this shift: No intake/output data recorded.  General appearance: alert and cooperative GI: soft, appropriately tender, non-distended Ostomy" edematous, beefy red Incision: no significant erythema JP: yellow tinged, unclear to me if serous fluid or bilious Lab Results:  Recent Labs    12/10/18 0441 12/11/18 0326  WBC 8.1 6.0  HGB 12.0 10.8*  HCT 35.6* 31.5*  PLT 129* 128*   BMET Recent Labs    12/10/18 0441 12/11/18 0326  NA 135 132*  K 4.3 4.2  CL 105 102  CO2 22 21*  GLUCOSE 127* 116*  BUN 21* 13  CREATININE 0.91 0.74  CALCIUM 8.2* 7.7*   PT/INR No results for input(s): LABPROT, INR in the last 72 hours. ABG No results for input(s): PHART, HCO3 in the last 72 hours.  Invalid input(s): PCO2, PO2  MEDS, Scheduled . acetaminophen  1,000 mg Oral Q6H  . enoxaparin (LOVENOX) injection  40 mg Subcutaneous Q24H  . feeding supplement  237 mL Oral BID BM  . gabapentin  300 mg Oral BID  . saccharomyces boulardii  250 mg Oral BID  . scopolamine  1 patch Transdermal Q72H  . thyroid  90 mg Oral Q0600    Studies/Results: Dg Abd Portable 1v  Result Date: 12/10/2018 CLINICAL DATA:  Nausea, colostomy. EXAM: PORTABLE ABDOMEN - 1 VIEW COMPARISON:  None. FINDINGS: Mildly dilated small bowel loops  are noted concerning for ileus or possibly obstruction. Status post cholecystectomy. Colostomy is noted in right lower quadrant. Soft tissue gas is noted in this area consistent with postoperative change. Surgical drain is seen in the pelvis. IMPRESSION: Mildly dilated small bowel loops are noted concerning for ileus or possibly distal small bowel obstruction. Colostomy is noted in right lower quadrant. Surgical drain is in the pelvis. Electronically Signed   By: Marijo Conception M.D.   On: 12/10/2018 10:23    Assessment: s/p Procedure(s): XI ROBOTIC ASSISTED LOWER ANTERIOR RESECTION, SPENIC FLEXURE IMMOBILIZATION, COLOANAL ANASTOMOSIS, RIGID PROCTOSCOPY DIVERTING LOOP ILEOSTOMY Patient Active Problem List   Diagnosis Date Noted  . Rectal cancer ([place]) 12/07/2018  . Abnormal EKG 03/28/2018  . Severe sepsis ([place]) 03/28/2018  . Sepsis due to undetermined organism ([place]) 03/27/2018  . GERD (gastroesophageal reflux disease) 03/27/2018  . Anxiety 03/27/2018  . Lactic acidosis 03/27/2018  . Hypokalemia 03/27/2018  . Antineoplastic chemotherapy induced pancytopenia ([place]) 03/27/2018  . Hyperglycemia 03/27/2018  . 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[place]) 02/26/2018  . Hematochezia 01/24/2018  . 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 Hyperlipidemia 11/09/2015  . Sciatica of right side 06/26/2012  . History of thyroid cancer 04/02/2012  . 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 Pain in joint, ankle and foot 03/09/2011    Expected post op course  Plan: Will get CT scan to determine cause of nausea and tachycarida Cont IVF's, NPO Dehydration looks better  Cont scop patch Ambulate Ostomy teaching    LOS: 4 days     .Rosario Adie, MD [facility], [place]   12/11/2018 8:14 AM

## 2018-12-11 NOTE — Progress Notes (Signed)
Patient remains in PACU. Report given to Dawson Bills, RN based on PACU report. Donne Hazel, RN

## 2018-12-11 NOTE — Anesthesia Preprocedure Evaluation (Addendum)
Anesthesia Evaluation  Patient identified by MRN, date of birth, ID band Patient awake    Reviewed: Allergy & Precautions, H&P , NPO status , Patient's Chart, lab work & pertinent test results  Airway Mallampati: II  TM Distance: >3 FB Neck ROM: Full    Dental no notable dental hx. (+) Teeth Intact, Dental Advisory Given   Pulmonary neg pulmonary ROS, former smoker,    Pulmonary exam normal breath sounds clear to auscultation       Cardiovascular negative cardio ROS   Rhythm:Regular Rate:Normal     Neuro/Psych Anxiety negative neurological ROS     GI/Hepatic Neg liver ROS, GERD  Controlled,  Endo/Other  Hypothyroidism   Renal/GU negative Renal ROS  negative genitourinary   Musculoskeletal   Abdominal   Peds  Hematology  (+) Blood dyscrasia, anemia ,   Anesthesia Other Findings   Reproductive/Obstetrics negative OB ROS                            Anesthesia Physical Anesthesia Plan  ASA: II  Anesthesia Plan: General   Post-op Pain Management:    Induction: Intravenous  PONV Risk Score and Plan: 4 or greater and Ondansetron, Dexamethasone and Midazolam  Airway Management Planned: Oral ETT  Additional Equipment:   Intra-op Plan:   Post-operative Plan: Extubation in OR  Informed Consent: I have reviewed the patients History and Physical, chart, labs and discussed the procedure including the risks, benefits and alternatives for the proposed anesthesia with the patient or authorized representative who has indicated his/her understanding and acceptance.     Dental advisory given  Plan Discussed with: CRNA  Anesthesia Plan Comments:         Anesthesia Quick Evaluation

## 2018-12-11 NOTE — Anesthesia Procedure Notes (Signed)
Procedure Name: Intubation Date/Time: 12/11/2018 4:17 PM Performed by: West Pugh, CRNA Pre-anesthesia Checklist: Patient identified, Emergency Drugs available, Suction available, Patient being monitored and Timeout performed Patient Re-evaluated:Patient Re-evaluated prior to induction Oxygen Delivery Method: Circle system utilized Preoxygenation: Pre-oxygenation with 100% oxygen Induction Type: IV induction, Rapid sequence and Cricoid Pressure applied Laryngoscope Size: Mac and 3 Grade View: Grade I Tube type: Oral Tube size: 7.0 mm Number of attempts: 1 Airway Equipment and Method: Stylet Placement Confirmation: ETT inserted through vocal cords under direct vision,  positive ETCO2,  CO2 detector and breath sounds checked- equal and bilateral Secured at: 21 cm Tube secured with: Tape Dental Injury: Teeth and Oropharynx as per pre-operative assessment

## 2018-12-11 NOTE — Anesthesia Postprocedure Evaluation (Signed)
Anesthesia Post Note  Patient: Samantha Reynolds  Procedure(s) Performed: LAPAROSCOPY  ILEOSTOMY REVISION AND ABDOMINAL WASHOUT (N/A Abdomen)     Patient location during evaluation: PACU Anesthesia Type: General Level of consciousness: awake and alert Pain management: pain level controlled Vital Signs Assessment: post-procedure vital signs reviewed and stable Respiratory status: spontaneous breathing, nonlabored ventilation, respiratory function stable and patient connected to nasal cannula oxygen Cardiovascular status: blood pressure returned to baseline and stable Postop Assessment: no apparent nausea or vomiting Anesthetic complications: no    Last Vitals:  Vitals:   12/11/18 1845 12/11/18 1850  BP: 126/84 125/82  Pulse: 92 91  Resp: 12 12  Temp:  36.9 C  SpO2: 100% 100%    Last Pain:  Vitals:   12/11/18 1850  TempSrc:   PainSc: Asleep                 Takao Lizer,W. [place]MOND

## 2018-12-11 NOTE — Addendum Note (Signed)
Addendum  created 12/11/18 1939 by West Pugh, CRNA   Intraprocedure Flowsheets edited

## 2018-12-12 ENCOUNTER — Encounter (HOSPITAL_COMMUNITY): Payer: Self-pay | Admitting: General Surgery

## 2018-12-12 MED ORDER — METRONIDAZOLE IN NACL 5-0.79 MG/ML-% IV SOLN
500.0000 mg | Freq: Three times a day (TID) | INTRAVENOUS | Status: DC
  Administered 2018-12-12 – 2018-12-19 (×21): 500 mg via INTRAVENOUS
  Filled 2018-12-12 (×21): qty 100

## 2018-12-12 MED ORDER — ACETAMINOPHEN 10 MG/ML IV SOLN
1000.0000 mg | Freq: Four times a day (QID) | INTRAVENOUS | Status: AC
  Administered 2018-12-12 (×4): 1000 mg via INTRAVENOUS
  Filled 2018-12-12 (×4): qty 100

## 2018-12-12 MED ORDER — CIPROFLOXACIN IN D5W 400 MG/200ML IV SOLN
400.0000 mg | Freq: Two times a day (BID) | INTRAVENOUS | Status: DC
  Administered 2018-12-12 – 2018-12-17 (×11): 400 mg via INTRAVENOUS
  Filled 2018-12-12 (×11): qty 200

## 2018-12-12 MED ORDER — PHENOL 1.4 % MT LIQD
1.0000 | OROMUCOSAL | Status: DC | PRN
  Filled 2018-12-12: qty 177

## 2018-12-12 NOTE — Progress Notes (Signed)
1 Day Post-Op ileostomy revision POD 5 Robotic LAR, diverting ostomy Subjective: Feeling a bit better. Pain controlled  Objective: Vital signs in last 24 hours: Temp:  [97.8 F (36.6 C)-99.7 F (37.6 C)] 98.2 F (36.8 C) (09/16 0435) Pulse Rate:  [59-107] 90 (09/16 0435) Resp:  [11-18] 18 (09/16 0435) BP: (111-128)/(79-89) 113/82 (09/16 0435) SpO2:  [99 %-100 %] 100 % (09/16 0435) Weight:  [72.4 kg-76.9 kg] 76.9 kg (09/16 0435)   Intake/Output from previous day: 09/15 0701 - 09/16 0700 In: 3134.2 [P.O.:60; I.V.:2924.2; IV Piggyback:150] Out: 3185 [Urine:2125; Drains:530; Stool:30] Intake/Output this shift: No intake/output data recorded.  General appearance: alert and cooperative GI: soft, appropriately tender, non-distended.  NG with bilious ouput Ostomy: edematous, beefy red, no real output noted Incision: no significant erythema Ostomy JP: yellow tinged, cloudy Pelvis JP: Serous fluid  Lab Results:  Recent Labs    12/10/18 0441 12/11/18 0326  WBC 8.1 6.0  HGB 12.0 10.8*  HCT 35.6* 31.5*  PLT 129* 128*   BMET Recent Labs    12/10/18 0441 12/11/18 0326  NA 135 132*  K 4.3 4.2  CL 105 102  CO2 22 21*  GLUCOSE 127* 116*  BUN 21* 13  CREATININE 0.91 0.74  CALCIUM 8.2* 7.7*   PT/INR No results for input(s): LABPROT, INR in the last 72 hours. ABG No results for input(s): PHART, HCO3 in the last 72 hours.  Invalid input(s): PCO2, PO2  MEDS, Scheduled . enoxaparin (LOVENOX) injection  40 mg Subcutaneous Q24H  . feeding supplement  237 mL Oral BID BM  . gabapentin  300 mg Oral BID  . saccharomyces boulardii  250 mg Oral BID  . scopolamine  1 patch Transdermal Q72H  . thyroid  90 mg Oral Q0600    Studies/Results: Dg Abd 1 View  Result Date: 12/11/2018 CLINICAL DATA:  NG tube placement. EXAM: ABDOMEN - 1 VIEW COMPARISON:  None. FINDINGS: An NG tube is noted with tip overlying the distal stomach. Catheter overlying the RIGHT abdomen is noted.  IMPRESSION: NG tube with tip overlying the distal stomach. Electronically Signed   By: Margarette Canada M.D.   On: 12/11/2018 18:44   Ct Abdomen Pelvis W Contrast  Result Date: 12/11/2018 CLINICAL DATA:  Status post robotic assisted low APR with colo-anal anastomosis and diverting loop ileostomy. Patient reports nausea since surgery. EXAM: CT ABDOMEN AND PELVIS WITH CONTRAST TECHNIQUE: Multidetector CT imaging of the abdomen and pelvis was performed using the standard protocol following bolus administration of intravenous contrast. CONTRAST:  149mL OMNIPAQUE IOHEXOL 300 MG/ML  SOLN COMPARISON:  09/27/2018 FINDINGS: Lower chest: No acute abnormality. Hepatobiliary: 1.4 x 0.7 by 1.3 cm low-density structure in posterior right lobe of liver is noted corresponding to previous treated metastasis. On the most recent CT from 09/27/18 this measured 0.7 x 0.5 by 0.5 cm. No additional status post cholecystectomy. No biliary dilatation. Pancreas: Unremarkable. No pancreatic ductal dilatation or surrounding inflammatory changes. Spleen: Spleen measures 13.2 cm in cranial caudal dimension. Stable. Stable cyst. Adrenals/Urinary Tract: Normal appearance of the adrenal glands. No kidney mass or hydronephrosis identified bilaterally. Urinary bladder normal. Stomach/Bowel: Postop change from low APR with colorectal anastomosis. There is a right lower quadrant ileostomy. Dilated small bowel loops are noted up to the level of the ileostomy site which measure up to 3.5 cm in diameter. There is decreased caliber of the small bowel at the ostomy site which measures 1.8 cm in diameter. Wall thickening of the small bowel within the ostomy site  is noted which measures up to 6 mm in single wall thickness. Colon is decreased in caliber throughout. Vascular/Lymphatic: No significant vascular findings are present. No enlarged abdominal or pelvic lymph nodes. Reproductive: Uterus and bilateral adnexa are unremarkable. Other: A surgical drainage  catheter is identified which enters through a right lower quadrant approach and terminates in the posterior pelvis. Small volume of pneumoperitoneum is identified, likely postoperative. There is no free fluid or fluid collection within the abdomen or pelvis. There is edema, gas and fluid within the ventral abdominal wall surrounding the ostomy site which is nonspecific in the early postoperative time frame. Musculoskeletal: No acute or significant osseous findings. IMPRESSION: 1. Postoperative changes from low APR with colo anal anastomosis and right lower quadrant ileostomy. 2. Dilated loops of small bowel up to the ostomy site identified, likely reflecting postoperative ileus. There is mild wall thickening involving the distal small bowel loops within the abdominal wall which may reflect postoperative change. 3. Previously treated liver metastases within posterior right lobe of liver appears increased in size when compared with CT from 09/27/2018. Electronically Signed   By: Kerby Moors M.D.   On: 12/11/2018 10:42   Dg Abd Portable 1v  Result Date: 12/10/2018 CLINICAL DATA:  Nausea, colostomy. EXAM: PORTABLE ABDOMEN - 1 VIEW COMPARISON:  None. FINDINGS: Mildly dilated small bowel loops are noted concerning for ileus or possibly obstruction. Status post cholecystectomy. Colostomy is noted in right lower quadrant. Soft tissue gas is noted in this area consistent with postoperative change. Surgical drain is seen in the pelvis. IMPRESSION: Mildly dilated small bowel loops are noted concerning for ileus or possibly distal small bowel obstruction. Colostomy is noted in right lower quadrant. Surgical drain is in the pelvis. Electronically Signed   By: Marijo Conception M.D.   On: 12/10/2018 10:23    Assessment: s/p Procedure(s): XI ROBOTIC ASSISTED LOWER ANTERIOR RESECTION, SPENIC FLEXURE IMMOBILIZATION, COLOANAL ANASTOMOSIS, RIGID PROCTOSCOPY DIVERTING LOOP ILEOSTOMY Patient Active Problem List   Diagnosis  Date Noted  . Rectal cancer ([place]) 12/07/2018  . Abnormal EKG 03/28/2018  . Severe sepsis ([place]) 03/28/2018  . Sepsis due to undetermined organism ([place]) 03/27/2018  . GERD (gastroesophageal reflux disease) 03/27/2018  . Anxiety 03/27/2018  . Lactic acidosis 03/27/2018  . Hypokalemia 03/27/2018  . Antineoplastic chemotherapy induced pancytopenia ([place]) 03/27/2018  . Hyperglycemia 03/27/2018  . 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 Hematochezia 01/24/2018  . 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 Hyperlipidemia 11/09/2015  . Sciatica of right side 06/26/2012  . History of thyroid cancer 04/02/2012  . 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 Pain in joint, ankle and foot 03/09/2011    Ileostomy leak into SQ, repaired in OR 9/15.  Pt with ileus  Plan:  Cont IVF's, NPO, NG Dehydration appears resolved Cont scop patch Ambulate Ostomy teaching Cipro/Flagyl for peristomal stool contamination    LOS: 5 days     .Rosario Adie, [facility], [place]   12/12/2018 8:28 AM

## 2018-12-13 ENCOUNTER — Ambulatory Visit (HOSPITAL_COMMUNITY): Payer: 59 | Admitting: Hematology

## 2018-12-13 LAB — BASIC METABOLIC PANEL
Anion gap: 6 (ref 5–15)
BUN: 10 mg/dL (ref 6–20)
CO2: 26 mmol/L (ref 22–32)
Calcium: 7.8 mg/dL — ABNORMAL LOW (ref 8.9–10.3)
Chloride: 104 mmol/L (ref 98–111)
Creatinine, Ser: 0.68 mg/dL (ref 0.44–1.00)
GFR calc Af Amer: >60 mL/min (ref >60)
GFR calc non Af Amer: >60 mL/min (ref >60)
Glucose, Bld: 110 mg/dL — ABNORMAL HIGH (ref 70–99)
Potassium: 3.4 mmol/L — ABNORMAL LOW (ref 3.5–5.1)
Sodium: 136 mmol/L (ref 135–145)

## 2018-12-13 LAB — CBC
HCT: 28.5 % — ABNORMAL LOW (ref 36.0–46.0)
Hemoglobin: 9.6 g/dL — ABNORMAL LOW (ref 12.0–15.0)
MCH: 32.4 pg (ref 26.0–34.0)
MCHC: 33.7 g/dL (ref 30.0–36.0)
MCV: 96.3 fL (ref 80.0–100.0)
Platelets: 131 10*3/uL — ABNORMAL LOW (ref 150–400)
RBC: 2.96 MIL/uL — ABNORMAL LOW (ref 3.87–5.11)
RDW: 13.4 % (ref 11.5–15.5)
WBC: 6.1 10*3/uL (ref 4.0–10.5)
nRBC: 0 % (ref 0.0–0.2)

## 2018-12-13 NOTE — Progress Notes (Signed)
2 Days Post-Op ileostomy revision POD 5 Robotic LAR, diverting ostomy Subjective: Feeling a bit better. Pain controlled.  No nausea  Objective: Vital signs in last 24 hours: Temp:  [98.2 F (36.8 C)-99.6 F (37.6 C)] 99.6 F (37.6 C) (09/17 0550) Pulse Rate:  [84-98] 98 (09/17 0550) Resp:  [14-16] 16 (09/17 0550) BP: (98-113)/(66-81) 98/68 (09/17 0550) SpO2:  [95 %-99 %] 95 % (09/17 0550) Weight:  [75.3 kg] 75.3 kg (09/17 0500)   Intake/Output from previous day: 09/16 0701 - 09/17 0700 In: 2453.7 [P.O.:45; I.V.:1500; IV Piggyback:908.7] Out: 4705 [Urine:2850; Emesis/NG output:1400; Drains:280; Stool:175] Intake/Output this shift: No intake/output data recorded.  General appearance: alert and cooperative GI: soft, appropriately tender, non-distended.  NG with bilious ouput Ostomy: edematous, beefy red, no real output noted Incision: no significant erythema Ostomy JP: yellow tinged, cloudy Pelvis JP: Serous fluid  Lab Results:  Recent Labs    12/11/18 0326 12/13/18 0312  WBC 6.0 6.1  HGB 10.8* 9.6*  HCT 31.5* 28.5*  PLT 128* 131*   BMET Recent Labs    12/11/18 0326 12/13/18 0312  NA 132* 136  K 4.2 3.4*  CL 102 104  CO2 21* 26  GLUCOSE 116* 110*  BUN 13 10  CREATININE 0.74 0.68  CALCIUM 7.7* 7.8*   PT/INR No results for input(s): LABPROT, INR in the last 72 hours. ABG No results for input(s): PHART, HCO3 in the last 72 hours.  Invalid input(s): PCO2, PO2  MEDS, Scheduled . enoxaparin (LOVENOX) injection  40 mg Subcutaneous Q24H  . feeding supplement  237 mL Oral BID BM  . gabapentin  300 mg Oral BID  . saccharomyces boulardii  250 mg Oral BID  . scopolamine  1 patch Transdermal Q72H  . thyroid  90 mg Oral Q0600    Studies/Results: Dg Abd 1 View  Result Date: 12/11/2018 CLINICAL DATA:  NG tube placement. EXAM: ABDOMEN - 1 VIEW COMPARISON:  None. FINDINGS: An NG tube is noted with tip overlying the distal stomach. Catheter overlying the RIGHT  abdomen is noted. IMPRESSION: NG tube with tip overlying the distal stomach. Electronically Signed   By: Margarette Canada M.D.   On: 12/11/2018 18:44   Ct Abdomen Pelvis W Contrast  Result Date: 12/11/2018 CLINICAL DATA:  Status post robotic assisted low APR with colo-anal anastomosis and diverting loop ileostomy. Patient reports nausea since surgery. EXAM: CT ABDOMEN AND PELVIS WITH CONTRAST TECHNIQUE: Multidetector CT imaging of the abdomen and pelvis was performed using the standard protocol following bolus administration of intravenous contrast. CONTRAST:  181mL OMNIPAQUE IOHEXOL 300 MG/ML  SOLN COMPARISON:  09/27/2018 FINDINGS: Lower chest: No acute abnormality. Hepatobiliary: 1.4 x 0.7 by 1.3 cm low-density structure in posterior right lobe of liver is noted corresponding to previous treated metastasis. On the most recent CT from 09/27/18 this measured 0.7 x 0.5 by 0.5 cm. No additional status post cholecystectomy. No biliary dilatation. Pancreas: Unremarkable. No pancreatic ductal dilatation or surrounding inflammatory changes. Spleen: Spleen measures 13.2 cm in cranial caudal dimension. Stable. Stable cyst. Adrenals/Urinary Tract: Normal appearance of the adrenal glands. No kidney mass or hydronephrosis identified bilaterally. Urinary bladder normal. Stomach/Bowel: Postop change from low APR with colorectal anastomosis. There is a right lower quadrant ileostomy. Dilated small bowel loops are noted up to the level of the ileostomy site which measure up to 3.5 cm in diameter. There is decreased caliber of the small bowel at the ostomy site which measures 1.8 cm in diameter. Wall thickening of the small bowel  within the ostomy site is noted which measures up to 6 mm in single wall thickness. Colon is decreased in caliber throughout. Vascular/Lymphatic: No significant vascular findings are present. No enlarged abdominal or pelvic lymph nodes. Reproductive: Uterus and bilateral adnexa are unremarkable. Other: A  surgical drainage catheter is identified which enters through a right lower quadrant approach and terminates in the posterior pelvis. Small volume of pneumoperitoneum is identified, likely postoperative. There is no free fluid or fluid collection within the abdomen or pelvis. There is edema, gas and fluid within the ventral abdominal wall surrounding the ostomy site which is nonspecific in the early postoperative time frame. Musculoskeletal: No acute or significant osseous findings. IMPRESSION: 1. Postoperative changes from low APR with colo anal anastomosis and right lower quadrant ileostomy. 2. Dilated loops of small bowel up to the ostomy site identified, likely reflecting postoperative ileus. There is mild wall thickening involving the distal small bowel loops within the abdominal wall which may reflect postoperative change. 3. Previously treated liver metastases within posterior right lobe of liver appears increased in size when compared with CT from 09/27/2018. Electronically Signed   By: Kerby Moors M.D.   On: 12/11/2018 10:42    Assessment: s/p Procedure(s): XI ROBOTIC ASSISTED LOWER ANTERIOR RESECTION, SPENIC FLEXURE IMMOBILIZATION, COLOANAL ANASTOMOSIS, RIGID PROCTOSCOPY DIVERTING LOOP ILEOSTOMY Patient Active Problem List   Diagnosis Date Noted  . Rectal cancer ([place]) 12/07/2018  . Abnormal EKG 03/28/2018  . Severe sepsis ([place]) 03/28/2018  . Sepsis due to undetermined organism ([place]) 03/27/2018  . GERD (gastroesophageal reflux disease) 03/27/2018  . Anxiety 03/27/2018  . Lactic acidosis 03/27/2018  . Hypokalemia 03/27/2018  . Antineoplastic chemotherapy induced pancytopenia ([place]) 03/27/2018  . Hyperglycemia 03/27/2018  . 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 Hematochezia 01/24/2018  . 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 Hyperlipidemia 11/09/2015  . Sciatica of right side 06/26/2012  . History of thyroid cancer 04/02/2012  . 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 Pain  in joint, ankle and foot 03/09/2011    Ileostomy leak into SQ, repaired in OR 9/15.  Pt with ileus  Plan:  Cont IVF's, NPO, clamp NG and check residuals in 6 h D/c foley D/c lateral JP Ambulate Ostomy teaching Cipro/Flagyl for peristomal stool contamination    LOS: 6 days     .Rosario Adie, [facility], [place]   12/13/2018 7:11 AM

## 2018-12-13 NOTE — Progress Notes (Signed)
NG clamped for 6 hours, residual checked as ordered with 40cc obtained.  Patient is c/o of nausea, returned to [facility] at this time

## 2018-12-13 NOTE — Consult Note (Signed)
[place] Nurse ostomy consult note Stoma type/location: RUQ ileostomy diversion  Ileostomy revision and abdominal revision.  Leaking ostomy into subcutaneous tissue.  Currently with ileus.  NG tube in place.  Will begin pouch change and additional teaching Friday.  Stomal assessment/size:  RUQ ileostomy WOC team will follow  [name] MSN, RN, FNP-BC CWON Wound, Ostomy, Continence Nurse Pager [phone]

## 2018-12-13 NOTE — Progress Notes (Signed)
Attempted to remove JP drain with another RN, noted increased output, called to CCS., PA paged.  Brooke PA at bedside and instructed this nurse to leave JP in and will reassess in AM

## 2018-12-14 DIAGNOSIS — R198 Other specified symptoms and signs involving the digestive system and abdomen: Secondary | ICD-10-CM

## 2018-12-14 DIAGNOSIS — Z932 Ileostomy status: Secondary | ICD-10-CM

## 2018-12-14 DIAGNOSIS — Z8639 Personal history of other endocrine, nutritional and metabolic disease: Secondary | ICD-10-CM

## 2018-12-14 DIAGNOSIS — E039 Hypothyroidism, unspecified: Secondary | ICD-10-CM | POA: Diagnosis present

## 2018-12-14 LAB — MAGNESIUM: Magnesium: 2.1 mg/dL (ref 1.7–2.4)

## 2018-12-14 MED ORDER — HYDROCORTISONE 1 % EX CREA
1.0000 "application " | TOPICAL_CREAM | Freq: Three times a day (TID) | CUTANEOUS | Status: DC | PRN
  Filled 2018-12-14: qty 28

## 2018-12-14 MED ORDER — LIP MEDEX EX OINT
1.0000 "application " | TOPICAL_OINTMENT | Freq: Two times a day (BID) | CUTANEOUS | Status: DC
  Administered 2018-12-14 – 2018-12-20 (×12): 1 via TOPICAL
  Filled 2018-12-14: qty 7

## 2018-12-14 MED ORDER — OXYCODONE HCL 5 MG PO TABS
5.0000 mg | ORAL_TABLET | ORAL | Status: DC | PRN
  Administered 2018-12-14 – 2018-12-17 (×7): 10 mg via ORAL
  Filled 2018-12-14 (×8): qty 2

## 2018-12-14 MED ORDER — PSYLLIUM 95 % PO PACK
1.0000 | PACK | Freq: Two times a day (BID) | ORAL | Status: DC
  Administered 2018-12-14 – 2018-12-18 (×6): 1 via ORAL
  Filled 2018-12-14 (×9): qty 1

## 2018-12-14 MED ORDER — MENTHOL 3 MG MT LOZG: 1.0000 | LOZENGE | OROMUCOSAL | Status: DC | PRN

## 2018-12-14 MED ORDER — MAGIC MOUTHWASH
15.0000 mL | Freq: Four times a day (QID) | ORAL | Status: DC | PRN
  Filled 2018-12-14: qty 15

## 2018-12-14 MED ORDER — SIMETHICONE 40 MG/0.6ML PO SUSP
40.0000 mg | Freq: Four times a day (QID) | ORAL | Status: DC | PRN
  Filled 2018-12-14: qty 0.6

## 2018-12-14 MED ORDER — GABAPENTIN 300 MG PO CAPS: 300.0000 mg | ORAL_CAPSULE | Freq: Two times a day (BID) | ORAL | Status: DC

## 2018-12-14 MED ORDER — BISMUTH SUBSALICYLATE 262 MG/15ML PO SUSP
30.0000 mL | Freq: Three times a day (TID) | ORAL | Status: DC | PRN
  Administered 2018-12-16: 22:00:00 30 mL via ORAL
  Filled 2018-12-14 (×2): qty 236

## 2018-12-14 MED ORDER — LACTATED RINGERS IV BOLUS: 1000.0000 mL | Freq: Three times a day (TID) | INTRAVENOUS | Status: AC | PRN

## 2018-12-14 MED ORDER — HYDROCORTISONE (PERIANAL) 2.5 % EX CREA
1.0000 "application " | TOPICAL_CREAM | Freq: Four times a day (QID) | CUTANEOUS | Status: DC | PRN
  Filled 2018-12-14: qty 28.35

## 2018-12-14 MED ORDER — GUAIFENESIN-DM 100-10 MG/5ML PO SYRP: 10.0000 mL | ORAL_SOLUTION | ORAL | Status: DC | PRN

## 2018-12-14 MED ORDER — SODIUM CHLORIDE 0.9 % IV SOLN
INTRAVENOUS | Status: DC | PRN
  Administered 2018-12-14: 250 mL via INTRAVENOUS

## 2018-12-14 MED ORDER — METHOCARBAMOL 1000 MG/10ML IJ SOLN
1000.0000 mg | Freq: Four times a day (QID) | INTRAVENOUS | Status: DC | PRN
  Filled 2018-12-14: qty 10

## 2018-12-14 MED ORDER — PROCHLORPERAZINE EDISYLATE 10 MG/2ML IJ SOLN
5.0000 mg | INTRAMUSCULAR | Status: DC | PRN
  Administered 2018-12-14 – 2018-12-19 (×5): 10 mg via INTRAVENOUS
  Filled 2018-12-14 (×5): qty 2

## 2018-12-14 MED ORDER — FERROUS SULFATE 325 (65 FE) MG PO TABS
325.0000 mg | ORAL_TABLET | Freq: Two times a day (BID) | ORAL | Status: DC
  Administered 2018-12-14 – 2018-12-21 (×14): 325 mg via ORAL
  Filled 2018-12-14 (×13): qty 1

## 2018-12-14 MED ORDER — POTASSIUM CHLORIDE CRYS ER 20 MEQ PO TBCR
40.0000 meq | EXTENDED_RELEASE_TABLET | Freq: Every day | ORAL | Status: AC
  Administered 2018-12-15: 20 meq via ORAL
  Administered 2018-12-16: 40 meq via ORAL
  Filled 2018-12-14 (×3): qty 2

## 2018-12-14 MED ORDER — POTASSIUM CHLORIDE 10 MEQ/100ML IV SOLN
10.0000 meq | INTRAVENOUS | Status: AC
  Administered 2018-12-14 (×4): 10 meq via INTRAVENOUS
  Filled 2018-12-14: qty 100

## 2018-12-14 MED ORDER — ACETAMINOPHEN 500 MG PO TABS
1000.0000 mg | ORAL_TABLET | Freq: Three times a day (TID) | ORAL | Status: DC
  Administered 2018-12-14 – 2018-12-21 (×21): 1000 mg via ORAL
  Filled 2018-12-14 (×20): qty 2

## 2018-12-14 MED ORDER — METHOCARBAMOL 500 MG PO TABS
1000.0000 mg | ORAL_TABLET | Freq: Four times a day (QID) | ORAL | Status: DC | PRN
  Administered 2018-12-15: 1000 mg via ORAL
  Administered 2018-12-16 – 2018-12-17 (×3): 500 mg via ORAL
  Filled 2018-12-14 (×5): qty 2

## 2018-12-14 MED ORDER — GABAPENTIN 300 MG PO CAPS
300.0000 mg | ORAL_CAPSULE | Freq: Three times a day (TID) | ORAL | Status: DC
  Administered 2018-12-14 – 2018-12-21 (×22): 300 mg via ORAL
  Filled 2018-12-14 (×21): qty 1

## 2018-12-14 NOTE — Consult Note (Signed)
[place] Nurse ostomy consult note Stoma type/location: RLQ ileostomy Stomal assessment/size: 1 1/4" well budded  Loose green stool in pouch Peristomal assessment: erythema to peristomal skin  Protect with barrier ring, intact Treatment options for stomal/peristomal skin: barrier ring and convex ostomy pouch Output see above Ostomy pouching: 1pc.convex with barrier ring Education provided: Pouch change performed with spouse at bedside to observe.  He is attentive and asks questions.  Discussed twice weekly pouch changes, showering and activity level. Patient demonstrated opening and closing pouch.  Enrolled patient in [place] program: Samantha Reynolds today.  [place] team will follow.  [name] MSN, RN, FNP-BC CWON Wound, Ostomy, Continence Nurse Pager [phone]

## 2018-12-14 NOTE — Progress Notes (Signed)
Samantha Reynolds [id number] 1965-10-17  CARE TEAM:  PCP: [name], MD  Outpatient Care Team: Patient Care Team: [name], MD as PCP - General (Family Medicine) Barrington Ellison, RN as [facility]  Inpatient Treatment Team: Treatment Team: Attending Provider: Leighton Ruff, MD; Technician: Aida Raider, [place]; Utilization Review: Barbara Cower, RN; Consulting Physician: Michael Boston, MD; [place] Nurse: Jeannie Fend, RN; Registered Nurse: Sunny Schlein, RN; Technician: Abbe Amsterdam, NT   Problem List:   Principal Problem:   Malignant neoplasm of rectum [facility]) Active Problems:   Rosanna Randy syndrome   Hyperlipidemia   GERD (gastroesophageal reflux disease)   Anxiety   H/O Hashimoto thyroiditis   Hypothyroidism   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   High output ileostomy ([place])   12/07/2018 POST-OPERATIVE DIAGNOSIS:  rectal cancer, distal  PROCEDURE:  XI ROBOTIC ASSISTED LOWER ANTERIOR RESECTION, SPLENIC FLEXURE MOBILIZATION, COLOANAL ANASTOMOSIS, INTRAOPERATIVE ASSESSMENT OF VASCULAR PERFUSION and DIVERTING LOOP ILEOSTOMY  Surgeon(s): Leighton Ruff, MD Michael Boston, MD  12/11/2018  3 Days Post-Op    POST-OPERATIVE DIAGNOSIS:  Superficial ostomy leak  PROCEDURE:  DIAGNOSTIC LAPAROSCOPY,  ILEOSTOMY REVISION AND ABDOMINAL WASHOUT    Surgeon(s): Leighton Ruff, MD   Assessment  Ileus resolving  [facility] Stay = 7 days)  Plan:  Nasogastric tube removed.  Start liquids.  Advance to dysphagia 1/full as tolerated.  High output ileostomy.  Start on some psyllium and iron twice a day.  Pepto-Bismol as needed diarrhea.  May start Imodium later.  Try not to be too aggressive since ileus just resolved.  Improved pain control.  Peritoneal drain removed.  Keep subcutaneous drain for 10 days according to Dr. Marcello Moores to minimize any seroma or abscess formation.  Ileostomy care.  Anxiolysis.  Thyroid  replacement  -VTE prophylaxis- SCDs, etc -mobilize as tolerated to help recovery  20 minutes spent in review, evaluation, examination, counseling, and coordination of care.  More than 50% of that time was spent in counseling.  12/14/2018    Subjective: (Chief complaint)  Tolerated nasogastric tube clamping trial.  Nasogastric tube removed.  Foley removed.  Confusion over which drain removed so not did not happen yet.  Passing gas and having liquid succus in her ileostomy bag.  No leaking.  Felt rather sore yesterday but feels a little better this morning.  Not nauseated.  Objective:  Vital signs:  Vitals:   12/13/18 0550 12/13/18 1343 12/13/18 2031 12/14/18 0522  BP: 98/68 103/68 103/69 103/74  Pulse: 98 (!) 110 (!) 102 94  Resp: 16 14 16 16   Temp: 99.6 F (37.6 C) 98.9 F (37.2 C) 98.6 F (37 C) 98.2 F (36.8 C)  TempSrc: Oral Oral Oral Oral  SpO2: 95% 98% 97% 97%  Weight:    74.2 kg  Height:        Last BM Date: 12/13/18  Intake/Output   Yesterday:  09/17 0701 - 09/18 0700 In: 1835.5 [P.O.:30; I.V.:1500; IV Piggyback:305.5] Out: 2285 [Urine:900; Emesis/NG output:50; Drains:435; Stool:900] This shift:  Total I/O In: 1835.5 [P.O.:30; I.V.:1500; IV Piggyback:305.5] Out: [id number] [Urine:650; Drains:115; Stool:250]  Bowel function:  Flatus: YES  BM:  YES  Drain: Serous   Physical Exam:  General: Pt awake/alert/oriented x4 in mild acute distress.  Tired/exhausted.  However not sickly. Eyes: PERRL, normal EOM.  Sclera clear.  No icterus Neuro: CN II-XII intact w/o focal sensory/motor deficits. Lymph: No head/neck/groin lymphadenopathy Psych:  No delerium/psychosis/paranoia HENT: Normocephalic, Mucus membranes moist.  No thrush Neck: Supple, No tracheal deviation Chest:  No chest wall pain w good excursion CV:  Pulses intact.  Regular rhythm MS: Normal AROM mjr joints.  No obvious deformity  Abdomen: Soft.  Mildy distended.  Mildly tender at incisions  only.  No evidence of peritonitis.  No incarcerated hernias. Ileostomy with good rosebud pink.  Gas and mildly thickened succus in bag  Ext:   No deformity.  No mjr edema.  No cyanosis Skin: No petechiae / purpura  Results:   Cultures: No results found for this or any previous visit (from the past 720 hour(s)).  Labs: Results for orders placed or performed during the hospital encounter of 12/07/18 (from the past 48 hour(s))  CBC     Status: Abnormal   Collection Time: 12/13/18  3:12 AM  Result Value Ref Range   WBC 6.1 4.0 - 10.5 K/uL   RBC 2.96 (L) 3.87 - 5.11 MIL/uL   Hemoglobin 9.6 (L) 12.0 - 15.0 g/dL   HCT 28.5 (L) 36.0 - 46.0 %   MCV 96.3 80.0 - 100.0 fL   MCH 32.4 26.0 - 34.0 pg   MCHC 33.7 30.0 - 36.0 g/dL   RDW 13.4 11.5 - 15.5 %   Platelets 131 (L) 150 - 400 K/uL   nRBC 0.0 0.0 - 0.2 %    Comment: Performed at [facility], [place] [address] [place], [place] [id number]  Basic metabolic panel     Status: Abnormal   Collection Time: 12/13/18  3:12 AM  Result Value Ref Range   Sodium 136 135 - 145 mmol/L   Potassium 3.4 (L) 3.5 - 5.1 mmol/L    Comment: REPEATED TO VERIFY DELTA CHECK NOTED    Chloride 104 98 - 111 mmol/L   CO2 26 22 - 32 mmol/L   Glucose, Bld 110 (H) 70 - 99 mg/dL   BUN 10 6 - 20 mg/dL   Creatinine, Ser 0.68 0.44 - 1.00 mg/dL   Calcium 7.8 (L) 8.9 - 10.3 mg/dL   GFR calc non Af Amer >60 >60 mL/min   GFR calc Af Amer >60 >60 mL/min   Anion gap 6 5 - 15    Comment: Performed at [facility], [place] [address], [place], [place] [address]    Imaging / Studies: No results found.  Medications / Allergies: per chart  Antibiotics: Anti-infectives (From admission, onward)   Start     Dose/Rate Route Frequency Ordered Stop   12/12/18 0800  ciprofloxacin (CIPRO) IVPB 400 mg     400 mg 200 mL/hr over 60 Minutes Intravenous Every 12 hours 12/12/18 0724     12/12/18 0800  metroNIDAZOLE (FLAGYL) IVPB 500 mg     500 mg  100 mL/hr over 60 Minutes Intravenous Every 8 hours 12/12/18 0724     12/11/18 1330  clindamycin (CLEOCIN) IVPB 900 mg     900 mg 100 mL/hr over 30 Minutes Intravenous On call to O.R. 12/11/18 1326 12/11/18 1648   12/07/18 1900  clindamycin (CLEOCIN) IVPB 900 mg     900 mg 100 mL/hr over 30 Minutes Intravenous Every 8 hours 12/07/18 1439 12/07/18 1910   12/07/18 1100  clindamycin (CLEOCIN) IVPB 900 mg     900 mg 100 mL/hr over 30 Minutes Intravenous  Once 12/07/18 1050 12/07/18 1136   12/07/18 1100  gentamicin (GARAMYCIN) 300 mg in dextrose 5 % 100 mL IVPB     300 mg 107.5 mL/hr over 60 Minutes Intravenous  Once 12/07/18 1050 12/07/18 1235        Note: Portions of this report may have been transcribed using voice recognition software. Every effort was made to ensure accuracy; however, inadvertent computerized transcription errors may be present.   Any transcriptional errors that result from this process are unintentional.     Adin Hector, MD, FACS, MASCRS Gastrointestinal and Minimally Invasive Surgery    [address], [place], [place] [address] [phone] Main / Paging [phone] Fax

## 2018-12-15 LAB — CBC
HCT: 28.2 % — ABNORMAL LOW (ref 36.0–46.0)
Hemoglobin: 9.4 g/dL — ABNORMAL LOW (ref 12.0–15.0)
MCH: 32.3 pg (ref 26.0–34.0)
MCHC: 33.3 g/dL (ref 30.0–36.0)
MCV: 96.9 fL (ref 80.0–100.0)
Platelets: 165 10*3/uL (ref 150–400)
RBC: 2.91 MIL/uL — ABNORMAL LOW (ref 3.87–5.11)
RDW: 13.5 % (ref 11.5–15.5)
WBC: 7.1 10*3/uL (ref 4.0–10.5)
nRBC: 0 % (ref 0.0–0.2)

## 2018-12-15 LAB — CREATININE, SERUM
Creatinine, Ser: 0.85 mg/dL (ref 0.44–1.00)
GFR calc Af Amer: >60 mL/min (ref >60)
GFR calc non Af Amer: >60 mL/min (ref >60)

## 2018-12-15 LAB — POTASSIUM: Potassium: 3.7 mmol/L (ref 3.5–5.1)

## 2018-12-15 MED ORDER — SODIUM CHLORIDE 0.9% FLUSH: 10.0000 mL | INTRAVENOUS | Status: DC | PRN

## 2018-12-15 NOTE — Progress Notes (Signed)
Samantha Reynolds [id number] 1966-03-17  CARE TEAM:  PCP: [name], MD  Outpatient Care Team: Patient Care Team: [name], MD as PCP - General (Family Medicine) Barrington Ellison, RN as [place] Management  Inpatient Treatment Team: Treatment Team: Attending Provider: Leighton Ruff, MD; Utilization Review: Barbara Cower, RN; Consulting Physician: Michael Boston, MD; [place] Nurse: Jeannie Fend, RN; Technician: Abbe Amsterdam, NT; Technician: Sue Lush, [place]; Registered Nurse: Donia Ast, RN; Registered Nurse: Petra Kuba, RN   Problem List:   Principal Problem:   Malignant neoplasm of rectum [facility]) Active Problems:   History of thyroid cancer   Rosanna Randy syndrome   Hyperlipidemia   GERD (gastroesophageal reflux disease)   Anxiety   Hypokalemia   H/O Hashimoto thyroiditis   Hypothyroidism   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   High output ileostomy ([place])   12/07/2018 POST-OPERATIVE DIAGNOSIS:  rectal cancer, distal  PROCEDURE:  XI ROBOTIC ASSISTED LOWER ANTERIOR RESECTION, SPLENIC FLEXURE MOBILIZATION, COLOANAL ANASTOMOSIS, INTRAOPERATIVE ASSESSMENT OF VASCULAR PERFUSION and DIVERTING LOOP ILEOSTOMY  Surgeon(s): Leighton Ruff, MD Michael Boston, MD  12/11/2018  4 Days Post-Op    POST-OPERATIVE DIAGNOSIS:  Superficial ostomy leak  PROCEDURE:  DIAGNOSTIC LAPAROSCOPY,  ILEOSTOMY REVISION AND ABDOMINAL WASHOUT    Surgeon(s): Leighton Ruff, MD   Assessment  Ileus resolving  [facility] Stay = 8 days)  Plan:  Advance diet to full liquids.   High output ileostomy.  Improved.  Will hold on adding imodium at this point.    Improved pain control.  Keep subcutaneous drain for 10 days according to Dr. Marcello Moores to minimize any seroma or abscess formation.  Ileostomy care.  Anxiolysis.  Thyroid replacement  -VTE prophylaxis- SCDs, etc -mobilize as tolerated to help recovery   12/15/2018     Subjective:   Feels a little queasy.  Some bloating.  No n/v.  Some belching.  Minimal gas in ostomy bag.    Objective:  Vital signs:  Vitals:   12/14/18 1030 12/14/18 1322 12/14/18 2032 12/15/18 0521  BP: 97/73 93/68 101/70 94/66  Pulse: 93 86 96 96  Resp:  17 16 16   Temp: 98.3 F (36.8 C)  98.4 F (36.9 C) 98.6 F (37 C)  TempSrc: Oral  Oral Oral  SpO2: 91% 97% 100% 100%  Weight:      Height:        Last BM Date: 12/15/18  Intake/Output   Yesterday:  09/18 0701 - 09/19 0700 In: 3409.8 [P.O.:970; I.V.:1406.8; IV Piggyback:933] Out: 2365 [Urine:1450; Drains:65; Stool:850] This shift:  Total I/O In: 627.3 [P.O.:360; IV Piggyback:267.3] Out: 10 [Drains:10]   Physical Exam:  General: Pt awake/alert/oriented x4 in mild acute distress.  Tired/exhausted.  However not sickly. Neuro: CN II-XII intact w/o focal sensory/motor deficits. Psych:  No delerium/psychosis/paranoia HENT: Normocephalic, Mucus membranes moist.  No thrush Neck: Supple, No tracheal deviation Chest:  No chest wall pain w good excursion CV:  Pulses intact.  Regular rhythm Abdomen: Soft.  Mildly distended.  Mildly tender at incisions only.  No evidence of peritonitis.  No incarcerated hernias. Ileostomy with good rosebud pink.  Minimal flatus in bag.  Some thicker output coming out.   Ext:   No deformity.  No mjr edema.  No cyanosis Skin: No petechiae / purpura  Results:   Cultures: No results found for this or any previous visit (from the past 720 hour(s)).  Labs: Results for orders placed or performed during the hospital encounter  of 12/07/18 (from the past 48 hour(s))  Magnesium     Status: None   Collection Time: 12/14/18  7:01 AM  Result Value Ref Range   Magnesium 2.1 1.7 - 2.4 mg/dL    Comment: Performed at [facility], [place] [address] [place], [place] [address]  Potassium     Status: None   Collection Time: 12/15/18  2:56 AM  Result Value Ref Range   Potassium  3.7 3.5 - 5.1 mmol/L    Comment: Performed at [facility], [place] [address], [place], [place] [address]  Creatinine, serum     Status: None   Collection Time: 12/15/18  2:56 AM  Result Value Ref Range   Creatinine, Ser 0.85 0.44 - 1.00 mg/dL   GFR calc non Af Amer >60 >60 mL/min   GFR calc Af Amer >60 >60 mL/min    Comment: Performed at [facility], [place] [address] [place], [place] [address]  CBC     Status: Abnormal   Collection Time: 12/15/18  2:56 AM  Result Value Ref Range   WBC 7.1 4.0 - 10.5 K/uL   RBC 2.91 (L) 3.87 - 5.11 MIL/uL   Hemoglobin 9.4 (L) 12.0 - 15.0 g/dL   HCT 28.2 (L) 36.0 - 46.0 %   MCV 96.9 80.0 - 100.0 fL   MCH 32.3 26.0 - 34.0 pg   MCHC 33.3 30.0 - 36.0 g/dL   RDW 13.5 11.5 - 15.5 %   Platelets 165 150 - 400 K/uL   nRBC 0.0 0.0 - 0.2 %    Comment: Performed at [facility], [place] [address], [place], [place] [address]    Imaging / Studies: No results found.  Medications / Allergies: per chart  Antibiotics: Anti-infectives (From admission, onward)   Start     Dose/Rate Route Frequency Ordered Stop   12/12/18 0800  ciprofloxacin (CIPRO) IVPB 400 mg     400 mg 200 mL/hr over 60 Minutes Intravenous Every 12 hours 12/12/18 0724     12/12/18 0800  metroNIDAZOLE (FLAGYL) IVPB 500 mg     500 mg 100 mL/hr over 60 Minutes Intravenous Every 8 hours 12/12/18 0724     12/11/18 1330  clindamycin (CLEOCIN) IVPB 900 mg     900 mg 100 mL/hr over 30 Minutes Intravenous On call to O.R. 12/11/18 1326 12/11/18 1648   12/07/18 1900  clindamycin (CLEOCIN) IVPB 900 mg     900 mg 100 mL/hr over 30 Minutes Intravenous Every 8 hours 12/07/18 1439 12/07/18 1910   12/07/18 1100  clindamycin (CLEOCIN) IVPB 900 mg     900 mg 100 mL/hr over 30 Minutes Intravenous  Once 12/07/18 1050 12/07/18 1136   12/07/18 1100  gentamicin (GARAMYCIN) 300 mg in dextrose 5 % 100 mL IVPB     300 mg 107.5 mL/hr over 60 Minutes Intravenous   Once 12/07/18 1050 12/07/18 1235        Note: Portions of this report may have been transcribed using voice recognition software. Every effort was made to ensure accuracy; however, inadvertent computerized transcription errors may be present.   Any transcriptional errors that result from this process are unintentional.     Milus Height, MD [facility] Surgical Oncology, General Surgery, Trauma and [employer] [facility], [place] [phone] Check [email or web address], password [email or web address] for coverage night/weekend    [address] [place], [place] [address] [phone] Main / Paging [phone] Fax

## 2018-12-16 MED ORDER — HYDROMORPHONE HCL 1 MG/ML IJ SOLN
0.5000 mg | INTRAMUSCULAR | Status: DC | PRN
  Administered 2018-12-16 – 2018-12-18 (×4): 1 mg via INTRAVENOUS
  Filled 2018-12-16 (×4): qty 1

## 2018-12-16 NOTE — Plan of Care (Signed)
Patient lying in bed this morning; c/o nausea. No other needs expressed. Will continue to monitor.

## 2018-12-16 NOTE — Progress Notes (Signed)
Samantha Reynolds [id number] 1966/01/22  CARE TEAM:  PCP: [name], MD  Outpatient Care Team: Patient Care Team: [name], MD as PCP - General (Family Medicine) Barrington Ellison, RN as [place] Management  Inpatient Treatment Team: Treatment Team: Attending Provider: Leighton Ruff, MD; Utilization Review: Barbara Cower, RN; Consulting Physician: Michael Boston, MD; [place] Nurse: Jeannie Fend, RN; Technician: Abbe Amsterdam, NT; Technician: Sue Lush, [place]; Registered Nurse: Petra Kuba, RN   Problem List:   Principal Problem:   Malignant neoplasm of rectum [facility]) Active Problems:   History of thyroid cancer   Rosanna Randy syndrome   Hyperlipidemia   GERD (gastroesophageal reflux disease)   Anxiety   Hypokalemia   H/O Hashimoto thyroiditis   Hypothyroidism   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   High output ileostomy ([place])   12/07/2018 POST-OPERATIVE DIAGNOSIS:  rectal cancer, distal  PROCEDURE:  XI ROBOTIC ASSISTED LOWER ANTERIOR RESECTION, SPLENIC FLEXURE MOBILIZATION, COLOANAL ANASTOMOSIS, INTRAOPERATIVE ASSESSMENT OF VASCULAR PERFUSION and DIVERTING LOOP ILEOSTOMY  Surgeon(s): Leighton Ruff, MD Michael Boston, MD  12/11/2018  5 Days Post-Op    POST-OPERATIVE DIAGNOSIS:  Superficial ostomy leak  PROCEDURE:  DIAGNOSTIC LAPAROSCOPY,  ILEOSTOMY REVISION AND ABDOMINAL WASHOUT    Surgeon(s): Leighton Ruff, MD   Assessment  Ileus resolving slowly  [facility] Stay = 9 days)  Plan:  Stay at full liquids given nausea.   High output ileostomy.  Appeared better in I&Os, but lots of liquid output in stoma tomorrow.  Given n/v, will not add imodium at this point.  Recheck electrolytes in AM.    Improved pain control.  Keep subcutaneous drain for 10 days according to Dr. Marcello Moores to minimize any seroma or abscess formation.  Protein calorie malnutrition- check prealbumin tomorrow to assess degree.  At least  moderate according to albumin or 2.6 9/15.  Ileostomy care.  Anxiolysis. Add IV pain meds as patient is quite nauseated.    Thyroid replacement  -VTE prophylaxis- SCDs, etc -mobilize as tolerated to help recovery   12/16/2018    Subjective:  Required multiple antiemetics overnight. + emesis as well.  Had quite a bit of gas pain as well.    Objective:  Vital signs:  Vitals:   12/15/18 1320 12/15/18 1423 12/15/18 2214 12/16/18 0529  BP: 92/66 94/65 94/69  105/73  Pulse: 88 88 92 91  Resp:   16 16  Temp:   99 F (37.2 C) 98.7 F (37.1 C)  TempSrc:   Oral Oral  SpO2:   98% 99%  Weight:      Height:        Last BM Date: 12/15/18  Intake/Output   Yesterday:  09/19 0701 - 09/20 0700 In: 1438.8 [P.O.:720; IV Piggyback:718.8] Out: 1010 [Urine:300; Drains:60; Stool:650] This shift:  Total I/O In: -  Out: 800 [Stool:800]   Physical Exam:  General: Pt awake/alert/oriented x4 in mild acute distress.  Has cool cloth and has emesis bag close.   Neuro: CN II-XII intact w/o focal sensory/motor deficits. Psych:  No delerium/psychosis/paranoia Neck: Supple, No tracheal deviation Chest:  No chest wall pain w good excursion CV:  Pulses intact.  Regular rhythm Abdomen: Soft.  Mildly distended.  Mildly tender at incisions only.  No evidence of peritonitis.  No incarcerated hernias. Ileostomy with good rosebud pink. Bag full of gas/liquid bilious stool.   Ext:   No deformity.  No mjr edema.  No cyanosis Skin: No petechiae / purpura  Results:  Cultures: No results found for this or any previous visit (from the past 720 hour(s)).  Labs: Results for orders placed or performed during the hospital encounter of 12/07/18 (from the past 48 hour(s))  Potassium     Status: None   Collection Time: 12/15/18  2:56 AM  Result Value Ref Range   Potassium 3.7 3.5 - 5.1 mmol/L    Comment: Performed at [facility], [place] [address], [place], [place] [address]   Creatinine, serum     Status: None   Collection Time: 12/15/18  2:56 AM  Result Value Ref Range   Creatinine, Ser 0.85 0.44 - 1.00 mg/dL   GFR calc non Af Amer >60 >60 mL/min   GFR calc Af Amer >60 >60 mL/min    Comment: Performed at [facility], [place] [address], [place], [place] [address]  CBC     Status: Abnormal   Collection Time: 12/15/18  2:56 AM  Result Value Ref Range   WBC 7.1 4.0 - 10.5 K/uL   RBC 2.91 (L) 3.87 - 5.11 MIL/uL   Hemoglobin 9.4 (L) 12.0 - 15.0 g/dL   HCT 28.2 (L) 36.0 - 46.0 %   MCV 96.9 80.0 - 100.0 fL   MCH 32.3 26.0 - 34.0 pg   MCHC 33.3 30.0 - 36.0 g/dL   RDW 13.5 11.5 - 15.5 %   Platelets 165 150 - 400 K/uL   nRBC 0.0 0.0 - 0.2 %    Comment: Performed at [facility], [place] [address] [place], [place] [address]    Imaging / Studies: No results found.  Medications / Allergies: per chart  Antibiotics: Anti-infectives (From admission, onward)   Start     Dose/Rate Route Frequency Ordered Stop   12/12/18 0800  ciprofloxacin (CIPRO) IVPB 400 mg     400 mg 200 mL/hr over 60 Minutes Intravenous Every 12 hours 12/12/18 0724     12/12/18 0800  metroNIDAZOLE (FLAGYL) IVPB 500 mg     500 mg 100 mL/hr over 60 Minutes Intravenous Every 8 hours 12/12/18 0724     12/11/18 1330  clindamycin (CLEOCIN) IVPB 900 mg     900 mg 100 mL/hr over 30 Minutes Intravenous On call to O.R. 12/11/18 1326 12/11/18 1648   12/07/18 1900  clindamycin (CLEOCIN) IVPB 900 mg     900 mg 100 mL/hr over 30 Minutes Intravenous Every 8 hours 12/07/18 1439 12/07/18 1910   12/07/18 1100  clindamycin (CLEOCIN) IVPB 900 mg     900 mg 100 mL/hr over 30 Minutes Intravenous  Once 12/07/18 1050 12/07/18 1136   12/07/18 1100  gentamicin (GARAMYCIN) 300 mg in dextrose 5 % 100 mL IVPB     300 mg 107.5 mL/hr over 60 Minutes Intravenous  Once 12/07/18 1050 12/07/18 1235        Note: Portions of this report may have been transcribed using voice  recognition software. Every effort was made to ensure accuracy; however, inadvertent computerized transcription errors may be present.   Any transcriptional errors that result from this process are unintentional.     Milus Height, MD [facility] Surgical Oncology, General Surgery, Trauma and [place] [facility], [place] [phone] Check [email or web address], password [email or web address] for coverage night/weekend    [address], [place], [place] [address] [phone] Main / Paging [phone] Fax

## 2018-12-17 ENCOUNTER — Ambulatory Visit (HOSPITAL_COMMUNITY): Payer: 59 | Admitting: Hematology

## 2018-12-17 LAB — URINALYSIS, ROUTINE W REFLEX MICROSCOPIC
Bilirubin Urine: NEGATIVE
Glucose, UA: NEGATIVE mg/dL
Hgb urine dipstick: NEGATIVE
Ketones, ur: NEGATIVE mg/dL
Leukocytes,Ua: NEGATIVE
Nitrite: NEGATIVE
Protein, ur: NEGATIVE mg/dL
Specific Gravity, Urine: 1.005 (ref 1.005–1.030)
pH: 6 (ref 5.0–8.0)

## 2018-12-17 LAB — CBC
HCT: 27.6 % — ABNORMAL LOW (ref 36.0–46.0)
Hemoglobin: 9 g/dL — ABNORMAL LOW (ref 12.0–15.0)
MCH: 31.8 pg (ref 26.0–34.0)
MCHC: 32.6 g/dL (ref 30.0–36.0)
MCV: 97.5 fL (ref 80.0–100.0)
Platelets: 208 10*3/uL (ref 150–400)
RBC: 2.83 MIL/uL — ABNORMAL LOW (ref 3.87–5.11)
RDW: 13.3 % (ref 11.5–15.5)
WBC: 9 10*3/uL (ref 4.0–10.5)
nRBC: 0 % (ref 0.0–0.2)

## 2018-12-17 LAB — BASIC METABOLIC PANEL
Anion gap: 6 (ref 5–15)
BUN: 9 mg/dL (ref 6–20)
CO2: 26 mmol/L (ref 22–32)
Calcium: 7.6 mg/dL — ABNORMAL LOW (ref 8.9–10.3)
Chloride: 103 mmol/L (ref 98–111)
Creatinine, Ser: 0.77 mg/dL (ref 0.44–1.00)
GFR calc Af Amer: >60 mL/min (ref >60)
GFR calc non Af Amer: >60 mL/min (ref >60)
Glucose, Bld: 96 mg/dL (ref 70–99)
Potassium: 3.8 mmol/L (ref 3.5–5.1)
Sodium: 135 mmol/L (ref 135–145)

## 2018-12-17 LAB — MAGNESIUM: Magnesium: 2.3 mg/dL (ref 1.7–2.4)

## 2018-12-17 LAB — PHOSPHORUS: Phosphorus: 4.2 mg/dL (ref 2.5–4.6)

## 2018-12-17 LAB — PREALBUMIN: Prealbumin: 17 mg/dL — ABNORMAL LOW (ref 18–38)

## 2018-12-17 MED ORDER — CHLORHEXIDINE GLUCONATE CLOTH 2 % EX PADS
6.0000 | MEDICATED_PAD | Freq: Every day | CUTANEOUS | Status: DC
  Administered 2018-12-17 – 2018-12-19 (×3): 6 via TOPICAL

## 2018-12-17 MED ORDER — FAMOTIDINE 20 MG PO TABS
20.0000 mg | ORAL_TABLET | Freq: Two times a day (BID) | ORAL | Status: DC
  Administered 2018-12-17 – 2018-12-21 (×9): 20 mg via ORAL
  Filled 2018-12-17 (×9): qty 1

## 2018-12-17 MED ORDER — LEVOFLOXACIN IN D5W 750 MG/150ML IV SOLN
750.0000 mg | INTRAVENOUS | Status: DC
  Administered 2018-12-17 – 2018-12-19 (×3): 750 mg via INTRAVENOUS
  Filled 2018-12-17 (×4): qty 150

## 2018-12-17 NOTE — Progress Notes (Signed)
Patient emptied her own ileostomy bag with minimal assistance. She demonstrated the process correctly. Donne Hazel, RN

## 2018-12-17 NOTE — Progress Notes (Signed)
Nutrition Follow-up  INTERVENTION:   -Ensure Surgery po BID, each supplement provides 330 kcal and 18 grams of protein.  NUTRITION DIAGNOSIS:   Increased nutrient needs related to catabolic illness(stage IV rectal cancer) as evidenced by estimated needs.  Ongoing  GOAL:   Patient will meet greater than or equal to 90% of their needs  Progressing.  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 9/15 s/p DIAGNOSTIC LAPAROSCOPY,  ILEOSTOMY REVISION AND ABDOMINAL WASHOUT   **RD working remotely**  Patient now on regular diet today after tolerating full liquids. Pt consumed 50% of breakfast and 30% of lunch. Pt is drinking Ensure Surgery, will continue.   Admission weight: 156 lbs. Current weight: 156 lbs. I/Os: +142 ml since admit UOP 9/20: 1600 ml  Medications: Ferrous sulfate tablet, Florastor capsule, D5 infusion  Labs reviewed: Mg/Phos WNL  Diet Order:   Diet Order            Diet regular Room service appropriate? Yes; Fluid consistency: Thin  Diet effective now              EDUCATION NEEDS:   No education needs have been identified at this time  Skin:  Skin Assessment: Skin Integrity Issues: Skin Integrity Issues:: Incisions Incisions: closed incision to abdomen  Last BM:  9/21 -300 ml from ileostomy  Height:   Ht Readings from Last 1 Encounters:  12/11/18 5\' 6"  (1.676 m)    Weight:   Wt Readings from Last 1 Encounters:  12/17/18 71 kg    Ideal Body Weight:  58.2 kg  BMI:  Body mass index is 25.26 kg/m.  Estimated Nutritional Needs:   Kcal:  [id number] (MSJ x 1.3-1.5)  Protein:   90-105 grams (1.3-1.5 grams/kg)  Fluid:  2-2.4 L/day  Clayton Bibles, MS, RD, LDN Inpatient Clinical Dietitian Pager: [phone] After Hours Pager: [phone]

## 2018-12-17 NOTE — Consult Note (Signed)
[place] Nurse ostomy follow up Stoma type/location: RLQ, ileosotmy Stomal assessment/size:  1 1/2" round with mucocutaneous separation from 9-3 o'clock  Peristomal assessment: induration circumferentially, area that is erythematous circumferentially, and larger area that extend to the right lateral aspect of the stoma, shared this assessment with Dr. Marcello Moores     Treatment options for stomal/peristomal skin: using 2" barrier ring to aid in seal Output: liquid green, high volumes Ostomy pouching: 1pc. Convex   Education provided:  Explained stoma characteristics; mucocutaneous separation, sloughing of the stoma   Demonstrated pouch change (cutting new skin barrier, measuring stoma, cleaning peristomal skin and stoma, use of barrier ring) Demonstrated lock and roll closure to patient again. She has emptied with assistance today with the bedside nurse Demonstrated use of wick to clean spout  Discussed bathing, diet, gas, medication use, constipation Discussed diet and risk of dehydration Provided patient and husband with educational materials on ileostomy; dehydration, diet, travel, work. 1pc pouch change and barrier ring   Enrolled patient in [place] Start Discharge program: Yes.   [place] Nurse will follow along with you for continued support with ostomy teaching and care [place] MSN, [place], [place], [place], [place]

## 2018-12-17 NOTE — Progress Notes (Signed)
6 Days Post-Op ileostomy revision POD 10 Robotic LAR, diverting ostomy Subjective: Feeling a bit better. Pain controlled.  No nausea. Tolerating fulls.  C/o indigestion   Objective: Vital signs in last 24 hours: Temp:  [98.2 F (36.8 C)] 98.2 F (36.8 C) (09/21 0548) Pulse Rate:  [88-93] 93 (09/21 0548) Resp:  [14] 14 (09/21 0548) BP: (90-97)/(67-71) 90/71 (09/21 0548) SpO2:  [98 %-100 %] 98 % (09/21 0548) Weight:  [71 kg] 71 kg (09/21 0548)   Intake/Output from previous day: 09/20 0701 - 09/21 0700 In: 2913.3 [P.O.:540; I.V.:1724.7; IV Piggyback:648.6] Out: 3895 [Urine:1600; Drains:20; [id number] Intake/Output this shift: No intake/output data recorded.  General appearance: alert and cooperative GI: soft, appropriately tender, non-distended.   Ostomy: edematous, beefy red, bilious output Incision: no significant erythema Ostomy JP: purulent  Lab Results:  Recent Labs    12/15/18 0256 12/17/18 0544  WBC 7.1 9.0  HGB 9.4* 9.0*  HCT 28.2* 27.6*  PLT 165 208   BMET Recent Labs    12/15/18 0256 12/17/18 0544  NA  --  135  K 3.7 3.8  CL  --  103  CO2  --  26  GLUCOSE  --  96  BUN  --  9  CREATININE 0.85 0.77  CALCIUM  --  7.6*   PT/INR No results for input(s): LABPROT, INR in the last 72 hours. ABG No results for input(s): PHART, HCO3 in the last 72 hours.  Invalid input(s): PCO2, PO2  MEDS, Scheduled . acetaminophen  1,000 mg Oral TID  . enoxaparin (LOVENOX) injection  40 mg Subcutaneous Q24H  . famotidine  20 mg Oral BID  . feeding supplement  237 mL Oral BID BM  . ferrous sulfate  325 mg Oral BID WC  . gabapentin  300 mg Oral TID  . lip balm  1 application Topical BID  . potassium chloride  40 mEq Oral Daily  . psyllium  1 packet Oral BID  . saccharomyces boulardii  250 mg Oral BID  . thyroid  90 mg Oral Q0600    Studies/Results: No results found.  Assessment: s/p Procedure(s): XI ROBOTIC ASSISTED LOWER ANTERIOR RESECTION, SPENIC FLEXURE  IMMOBILIZATION, COLOANAL ANASTOMOSIS, RIGID PROCTOSCOPY DIVERTING LOOP ILEOSTOMY Patient Active Problem List   Diagnosis Date Noted  . 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 High output ileostomy ([place]) 12/14/2018  . H/O Hashimoto thyroiditis   . Hypothyroidism   . Rectal cancer metastasized to liver ([place]) 10/04/2018  . Hyperbilirubinemia 04/24/2018  . Abnormal EKG 03/28/2018  . Severe sepsis ([place]) 03/28/2018  . Sepsis due to undetermined organism ([place]) 03/27/2018  . GERD (gastroesophageal reflux disease) 03/27/2018  . Anxiety 03/27/2018  . Lactic acidosis 03/27/2018  . Hypokalemia 03/27/2018  . Antineoplastic chemotherapy induced pancytopenia ([place]) 03/27/2018  . Hyperglycemia 03/27/2018  . 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[place]) 02/26/2018  . Hematochezia 01/24/2018  . 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 Hyperlipidemia 11/09/2015  . Sciatica of right side 06/26/2012  . History of thyroid cancer 04/02/2012  . 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 Pain in joint, ankle and foot 03/09/2011    Ileostomy leak into SQ, repaired in OR 9/15.  Pt with ileus that is now resolving  Plan:  Cont IVF's, advance to reg diet Ambulate Ostomy teaching Cipro/Flagyl for peristomal stool contamination    LOS: 10 days     .Rosario Adie, [facility], [place]   12/17/2018 8:35 AM

## 2018-12-18 LAB — COMPREHENSIVE METABOLIC PANEL
ALT: 10 U/L (ref 0–44)
AST: 15 U/L (ref 15–41)
Albumin: 2.3 g/dL — ABNORMAL LOW (ref 3.5–5.0)
Alkaline Phosphatase: 249 U/L — ABNORMAL HIGH (ref 38–126)
Anion gap: 6 (ref 5–15)
BUN: 6 mg/dL (ref 6–20)
CO2: 24 mmol/L (ref 22–32)
Calcium: 7.7 mg/dL — ABNORMAL LOW (ref 8.9–10.3)
Chloride: 104 mmol/L (ref 98–111)
Creatinine, Ser: 0.75 mg/dL (ref 0.44–1.00)
GFR calc Af Amer: >60 mL/min (ref >60)
GFR calc non Af Amer: >60 mL/min (ref >60)
Glucose, Bld: 93 mg/dL (ref 70–99)
Potassium: 3.5 mmol/L (ref 3.5–5.1)
Sodium: 134 mmol/L — ABNORMAL LOW (ref 135–145)
Total Bilirubin: 0.9 mg/dL (ref 0.3–1.2)
Total Protein: 5.2 g/dL — ABNORMAL LOW (ref 6.5–8.1)

## 2018-12-18 NOTE — Progress Notes (Signed)
7 Days Post-Op ileostomy revision POD 11 Robotic LAR, diverting ostomy Subjective: Feeling a bit better. Pain controlled.  No nausea. Tolerating some solid foods.   Objective: Vital signs in last 24 hours: Temp:  [97.6 F (36.4 C)-98.3 F (36.8 C)] 98.3 F (36.8 C) (09/22 0415) Pulse Rate:  [98] 98 (09/22 0415) Resp:  [15-18] 16 (09/22 0415) BP: (93-98)/(72-75) 93/72 (09/22 0415) SpO2:  [99 %-100 %] 100 % (09/22 0415)   Intake/Output from previous day: 09/21 0701 - 09/22 0700 In: 3343.9 [P.O.:1040; I.V.:1650; IV Piggyback:648.9] Out: 4615 [Urine:2950; Drains:90; [id number] Intake/Output this shift: No intake/output data recorded.  General appearance: alert and cooperative GI: soft, appropriately tender, non-distended.   Ostomy: edematous, beefy red, bilious output Incision: no significant erythema Ostomy JP: purulent, cellulitis improved  Lab Results:  Recent Labs    12/17/18 0544  WBC 9.0  HGB 9.0*  HCT 27.6*  PLT 208   BMET Recent Labs    12/17/18 0544 12/18/18 0443  NA 135 134*  K 3.8 3.5  CL 103 104  CO2 26 24  GLUCOSE 96 93  BUN 9 6  CREATININE 0.77 0.75  CALCIUM 7.6* 7.7*   PT/INR No results for input(s): LABPROT, INR in the last 72 hours. ABG No results for input(s): PHART, HCO3 in the last 72 hours.  Invalid input(s): PCO2, PO2  MEDS, Scheduled . acetaminophen  1,000 mg Oral TID  . Chlorhexidine Gluconate Cloth  6 each Topical Daily  . enoxaparin (LOVENOX) injection  40 mg Subcutaneous Q24H  . famotidine  20 mg Oral BID  . feeding supplement  237 mL Oral BID BM  . ferrous sulfate  325 mg Oral BID WC  . gabapentin  300 mg Oral TID  . lip balm  1 application Topical BID  . psyllium  1 packet Oral BID  . saccharomyces boulardii  250 mg Oral BID  . thyroid  90 mg Oral Q0600    Studies/Results: No results found.  Assessment: s/p Procedure(s): XI ROBOTIC ASSISTED LOWER ANTERIOR RESECTION, SPENIC FLEXURE IMMOBILIZATION, COLOANAL  ANASTOMOSIS, RIGID PROCTOSCOPY DIVERTING LOOP ILEOSTOMY Patient Active Problem List   Diagnosis Date Noted  . 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 High output ileostomy ([place]) 12/14/2018  . H/O Hashimoto thyroiditis   . Hypothyroidism   . Rectal cancer metastasized to liver ([place]) 10/04/2018  . Hyperbilirubinemia 04/24/2018  . Abnormal EKG 03/28/2018  . Severe sepsis ([place]) 03/28/2018  . Sepsis due to undetermined organism ([place]) 03/27/2018  . GERD (gastroesophageal reflux disease) 03/27/2018  . Anxiety 03/27/2018  . Lactic acidosis 03/27/2018  . Hypokalemia 03/27/2018  . Antineoplastic chemotherapy induced pancytopenia ([place]) 03/27/2018  . Hyperglycemia 03/27/2018  . 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[place]) 02/26/2018  . Hematochezia 01/24/2018  . 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 Hyperlipidemia 11/09/2015  . Sciatica of right side 06/26/2012  . History of thyroid cancer 04/02/2012  . 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 Pain in joint, ankle and foot 03/09/2011    Ileostomy leak into SQ, repaired in OR 9/15.  Pt with ileus that is now resolved  Plan:  Cont IVF's and reg diet Ambulate Ostomy teaching Levoquin/Flagyl for peristomal stool contamination, flush JP    LOS: 11 days     .Rosario Adie, [facility], [place]   12/18/2018 8:57 AM

## 2018-12-19 MED ORDER — CALCIUM POLYCARBOPHIL 625 MG PO TABS
625.0000 mg | ORAL_TABLET | Freq: Three times a day (TID) | ORAL | Status: DC
  Administered 2018-12-19 – 2018-12-21 (×7): 625 mg via ORAL
  Filled 2018-12-19 (×7): qty 1

## 2018-12-19 MED ORDER — METRONIDAZOLE IN NACL 5-0.79 MG/ML-% IV SOLN
500.0000 mg | Freq: Three times a day (TID) | INTRAVENOUS | Status: DC
  Administered 2018-12-19 – 2018-12-20 (×2): 500 mg via INTRAVENOUS
  Filled 2018-12-19: qty 100

## 2018-12-19 MED ORDER — LOPERAMIDE HCL 2 MG PO CAPS
2.0000 mg | ORAL_CAPSULE | Freq: Three times a day (TID) | ORAL | Status: DC
  Administered 2018-12-19 (×2): 2 mg via ORAL
  Filled 2018-12-19 (×3): qty 1

## 2018-12-19 NOTE — Progress Notes (Signed)
8 Days Post-Op ileostomy revision POD 11 Robotic LAR, diverting ostomy Subjective: Feeling a bit better. Eating better.  Pain controlled.  No nausea. Tolerating solid foods.   Objective: Vital signs in last 24 hours: Temp:  [98.1 F (36.7 C)-98.8 F (37.1 C)] 98.3 F (36.8 C) (09/23 0621) Pulse Rate:  [91-100] 91 (09/23 0621) Resp:  [16-18] 18 (09/23 0621) BP: (92-100)/(66-72) 96/66 (09/23 0621) SpO2:  [99 %-100 %] 100 % (09/23 0621)   Intake/Output from previous day: 09/22 0701 - 09/23 0700 In: 2826.8 [P.O.:930; I.V.:1276.9; IV Piggyback:619.9] Out: 4920 [Urine:2750; Drains:70; [id number] Intake/Output this shift: Total I/O In: 120 [P.O.:120] Out: -   General appearance: alert and cooperative GI: soft, appropriately tender, non-distended.   Ostomy: edematous, beefy red, bilious output Incision: no significant erythema Ostomy JP: purulent, cellulitis improved  Lab Results:  Recent Labs    12/17/18 0544  WBC 9.0  HGB 9.0*  HCT 27.6*  PLT 208   BMET Recent Labs    12/17/18 0544 12/18/18 0443  NA 135 134*  K 3.8 3.5  CL 103 104  CO2 26 24  GLUCOSE 96 93  BUN 9 6  CREATININE 0.77 0.75  CALCIUM 7.6* 7.7*   PT/INR No results for input(s): LABPROT, INR in the last 72 hours. ABG No results for input(s): PHART, HCO3 in the last 72 hours.  Invalid input(s): PCO2, PO2  MEDS, Scheduled . acetaminophen  1,000 mg Oral TID  . Chlorhexidine Gluconate Cloth  6 each Topical Daily  . enoxaparin (LOVENOX) injection  40 mg Subcutaneous Q24H  . famotidine  20 mg Oral BID  . feeding supplement  237 mL Oral BID BM  . ferrous sulfate  325 mg Oral BID WC  . gabapentin  300 mg Oral TID  . lip balm  1 application Topical BID  . loperamide  2 mg Oral TID WC & HS  . psyllium  1 packet Oral BID  . saccharomyces boulardii  250 mg Oral BID  . thyroid  90 mg Oral Q0600    Studies/Results: No results found.  Assessment: s/p Procedure(s): XI ROBOTIC ASSISTED LOWER  ANTERIOR RESECTION, SPENIC FLEXURE IMMOBILIZATION, COLOANAL ANASTOMOSIS, RIGID PROCTOSCOPY DIVERTING LOOP ILEOSTOMY Patient Active Problem List   Diagnosis Date Noted  . 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 High output ileostomy ([place]) 12/14/2018  . H/O Hashimoto thyroiditis   . Hypothyroidism   . Rectal cancer metastasized to liver ([place]) 10/04/2018  . Hyperbilirubinemia 04/24/2018  . Abnormal EKG 03/28/2018  . Severe sepsis ([place]) 03/28/2018  . Sepsis due to undetermined organism ([place]) 03/27/2018  . GERD (gastroesophageal reflux disease) 03/27/2018  . Anxiety 03/27/2018  . Lactic acidosis 03/27/2018  . Hypokalemia 03/27/2018  . Antineoplastic chemotherapy induced pancytopenia ([place]) 03/27/2018  . Hyperglycemia 03/27/2018  . 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[place]) 02/26/2018  . Hematochezia 01/24/2018  . 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 Hyperlipidemia 11/09/2015  . Sciatica of right side 06/26/2012  . History of thyroid cancer 04/02/2012  . 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 Pain in joint, ankle and foot 03/09/2011    Ileostomy leak into SQ, repaired in OR 9/15.  Pt with ileus that is now resolved  Plan:  Cont IVF's and reg diet Ambulate Ostomy teaching Levoquin/Flagyl for peristomal stool contamination, flush JP Imodium added for high output ostomy   LOS: 12 days     .Rosario Adie, [facility], [place]   12/19/2018 9:36 AM

## 2018-12-19 NOTE — Progress Notes (Signed)
Pt HR 80. Resting quietly in bed. No distress.

## 2018-12-19 NOTE — Progress Notes (Signed)
Pt walked and vitals taken. HR 110. Pt stated "my HR does that all the time". Will monitor.    MEWS Guidelines - (patients age 55 and over)  Red - At High Risk for Deterioration Yellow - At risk for Deterioration  1. Go to room and assess patient 2. Validate data. Is this patient's baseline? If data confirmed: 3. Is this an acute change? 4. Administer prn meds/treatments as ordered. 5. Note Sepsis score 6. Review goals of care 7. Sports coach, RRT nurse and Provider. 8. Ask Provider to come to bedside.  9. Document patient condition/interventions/response. 10. Increase frequency of vital signs and focused assessments to at least q15 minutes x 4, then q30 minutes x2. - If stable, then q1h x3, then q4h x3 and then q8h or dept. routine. - If unstable, contact Provider & RRT nurse. Prepare for possible transfer. 11. Add entry in progress notes using the smart phrase ".MEWS". 1. Go to room and assess patient 2. Validate data. Is this patient's baseline? If data confirmed: 3. Is this an acute change? 4. Administer prn meds/treatments as ordered? 5. Note Sepsis score 6. Review goals of care 7. Sports coach and Provider 8. Call RRT nurse as needed. 9. Document patient condition/interventions/response. 10. Increase frequency of vital signs and focused assessments to at least q2h x2. - If stable, then q4h x2 and then q8h or dept. routine. - If unstable, contact Provider & RRT nurse. Prepare for possible transfer. 11. Add entry in progress notes using the smart phrase ".MEWS".  Green - Likely stable [place] - Comfort Care Only  1. Continue routine/ordered monitoring.  2. Review goals of care. 1. Continue routine/ordered monitoring. 2. Review goals of care.

## 2018-12-19 NOTE — Consult Note (Addendum)
[place] Nurse ostomy follow up Stoma type/location: RUQ, end ileostomy Stomal assessment/size: 1 1/2" budded stoma,  pink Peristomal assessment: NA, however erythema has not extended past where I marked Monday  Treatment options for stomal/peristomal skin: using 2" barrier ring to aid seal  Output liquid green  Ostomy pouching: 1pc convex, intact  Education provided:  Patient emptied pouch today, reports that now that pouch is facing downward much easier for her independence.  Discussed bathing, diet, gas, medication use, constipation Discussed risk of peristomal hernia Discussed clothing options; ways to muffle gas Husband not in the room today Enrolled patient in [place] Start Discharge program: Yes Requested beige pouches with filter from SS today  [place] Nurse will follow along with you for continued support with ostomy teaching and care [place] MSN, [place], [place], [place], [place]

## 2018-12-20 LAB — BASIC METABOLIC PANEL
Anion gap: 9 (ref 5–15)
BUN: 7 mg/dL (ref 6–20)
CO2: 23 mmol/L (ref 22–32)
Calcium: 8 mg/dL — ABNORMAL LOW (ref 8.9–10.3)
Chloride: 104 mmol/L (ref 98–111)
Creatinine, Ser: 0.83 mg/dL (ref 0.44–1.00)
GFR calc Af Amer: >60 mL/min (ref >60)
GFR calc non Af Amer: >60 mL/min (ref >60)
Glucose, Bld: 99 mg/dL (ref 70–99)
Potassium: 3.3 mmol/L — ABNORMAL LOW (ref 3.5–5.1)
Sodium: 136 mmol/L (ref 135–145)

## 2018-12-20 LAB — CBC
HCT: 27.3 % — ABNORMAL LOW (ref 36.0–46.0)
Hemoglobin: 9 g/dL — ABNORMAL LOW (ref 12.0–15.0)
MCH: 32.3 pg (ref 26.0–34.0)
MCHC: 33 g/dL (ref 30.0–36.0)
MCV: 97.8 fL (ref 80.0–100.0)
Platelets: 229 10*3/uL (ref 150–400)
RBC: 2.79 MIL/uL — ABNORMAL LOW (ref 3.87–5.11)
RDW: 13.5 % (ref 11.5–15.5)
WBC: 6.7 10*3/uL (ref 4.0–10.5)
nRBC: 0 % (ref 0.0–0.2)

## 2018-12-20 MED ORDER — LOPERAMIDE HCL 2 MG PO CAPS
2.0000 mg | ORAL_CAPSULE | Freq: Once | ORAL | Status: AC
  Administered 2018-12-20: 2 mg via ORAL
  Filled 2018-12-20: qty 1

## 2018-12-20 MED ORDER — LOPERAMIDE HCL 2 MG PO CAPS
2.0000 mg | ORAL_CAPSULE | Freq: Three times a day (TID) | ORAL | Status: DC
  Administered 2018-12-20 – 2018-12-21 (×2): 2 mg via ORAL
  Filled 2018-12-20 (×3): qty 1

## 2018-12-20 MED ORDER — METRONIDAZOLE 500 MG PO TABS
500.0000 mg | ORAL_TABLET | Freq: Four times a day (QID) | ORAL | Status: DC
  Administered 2018-12-20 – 2018-12-21 (×4): 500 mg via ORAL
  Filled 2018-12-20 (×4): qty 1

## 2018-12-20 MED ORDER — LEVOFLOXACIN 500 MG PO TABS
500.0000 mg | ORAL_TABLET | Freq: Every day | ORAL | Status: DC
  Administered 2018-12-20 – 2018-12-21 (×2): 500 mg via ORAL
  Filled 2018-12-20 (×2): qty 1

## 2018-12-20 NOTE — TOC Initial Note (Signed)
Transition of Care [facility]) - Initial/Assessment Note    Patient Details  Name: Samantha Reynolds MRN: [id number] Date of Birth: 10-03-1965  Transition of Care [facility]) CM/SW Contact:    Lia Hopping, [place] Phone Number: 12/20/2018, 12:40 PM  Clinical Narrative:              CSW discussed Home Health options with the patient. Patient agreeable to [facility] that will assist with illostomy care. Patient reports her spouse is a Marine scientist and will assist her.  Patient and spouse would like additional support while they learn how to "really care for the illostomy."  CSW gave the patient the Medicare.gov list of Home Health agencies, patient chose [facility]).  Patient ambulating without assistance, no DME needed.   Expected Discharge Plan: [place] Barriers to Discharge: No Barriers Identified   Patient Goals and CMS Choice Patient states their goals for this hospitalization and ongoing recovery are:: to get better CMS Medicare.gov Compare Post Acute Care list provided to:: Patient Choice offered to / list presented to : Patient  Expected Discharge Plan and Services Expected Discharge Plan: [place] In-house Referral: Clinical Social Work     Living arrangements for the past 2 months: [place]: RN [employer] Agency: [facility] ([place]) Date [place]: 12/20/18 Time [place]: 61 Representative spoke with at [place]: Santiago Glad  Prior Living Arrangements/Services Living arrangements for the past 2 months: [place] with:: Minor Children, Spouse Patient language and need for interpreter reviewed:: No Do you feel safe going back to the place where you live?: Yes      Need for Family Participation in Patient Care: Yes (Comment) Care giver support system in place?: Yes (comment)   Criminal Activity/Legal Involvement Pertinent to Current  Situation/Hospitalization: No - Comment as needed  Activities of Daily Living Home Assistive Devices/Equipment: Eyeglasses ADL Screening (condition at time of admission) Patient's cognitive ability adequate to safely complete daily activities?: Yes Is the patient deaf or have difficulty hearing?: No Does the patient have difficulty seeing, even when wearing glasses/contacts?: No Does the patient have difficulty concentrating, remembering, or making decisions?: No Patient able to express need for assistance with ADLs?: Yes Does the patient have difficulty dressing or bathing?: No Independently performs ADLs?: Yes (appropriate for developmental age) Does the patient have difficulty walking or climbing stairs?: No Weakness of Legs: Both(more on left side due to disease) Weakness of Arms/Hands: Both  Permission Sought/Granted   Permission granted to share information with : Yes, Verbal Permission Granted     Permission granted to share info w AGENCY: Home Health        Emotional Assessment Appearance:: Appears stated age   Affect (typically observed): Accepting, Calm Orientation: : Oriented to Situation, Oriented to  Time, Oriented to Place, Oriented to Self Alcohol / Substance Use: Not Applicable Psych Involvement: No (comment)  Admission diagnosis:  rectal cancer, distal Patient Active Problem List   Diagnosis Date Noted  . 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 High output ileostomy ([place]) 12/14/2018  . H/O Hashimoto thyroiditis   . Hypothyroidism   . Rectal cancer metastasized to liver ([place]) 10/04/2018  . Hyperbilirubinemia 04/24/2018  . Abnormal EKG 03/28/2018  . Severe sepsis ([place]) 03/28/2018  . Sepsis  due to undetermined organism ([place]) 03/27/2018  . GERD (gastroesophageal reflux disease) 03/27/2018  . Anxiety 03/27/2018  . Lactic acidosis 03/27/2018  . Hypokalemia 03/27/2018  . Antineoplastic chemotherapy induced pancytopenia ([place]) 03/27/2018  .  Hyperglycemia 03/27/2018  . 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[place]) 02/26/2018  . Hematochezia 01/24/2018  . 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 Hyperlipidemia 11/09/2015  . Sciatica of right side 06/26/2012  . History of thyroid cancer 04/02/2012  . 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 Pain in joint, ankle and foot 03/09/2011   PCP:  [name], MD Pharmacy:   [place], [place] - [address] [facility] [address] [place] [address] Phone: [phone] Fax: [place] [address] [place] - [place] [address] [place] [address] [place] [address] Phone: [phone] Fax: [phone]  [place], [place] - [place] [address] Phone: [phone] Fax: [phone]     Social Determinants of Health (SDOH) Interventions    Readmission Risk Interventions No flowsheet data found.

## 2018-12-21 MED ORDER — LOPERAMIDE HCL 2 MG PO CAPS: 2.0000 mg | ORAL_CAPSULE | Freq: Three times a day (TID) | ORAL | 0 refills | Status: AC

## 2018-12-21 MED ORDER — LEVOFLOXACIN 500 MG PO TABS: 500.0000 mg | ORAL_TABLET | Freq: Every day | ORAL | 0 refills | Status: DC

## 2018-12-21 MED ORDER — CHLORHEXIDINE GLUCONATE CLOTH 2 % EX PADS: 6.0000 | MEDICATED_PAD | Freq: Every day | CUTANEOUS | Status: DC

## 2018-12-21 MED ORDER — CALCIUM POLYCARBOPHIL 625 MG PO TABS: 625.0000 mg | ORAL_TABLET | Freq: Two times a day (BID) | ORAL | Status: DC

## 2018-12-21 MED ORDER — HEPARIN SOD (PORK) LOCK FLUSH 100 UNIT/ML IV SOLN
500.0000 [IU] | INTRAVENOUS | Status: AC | PRN
  Administered 2018-12-21: 500 [IU]
  Filled 2018-12-21: qty 5

## 2018-12-21 MED FILL — levoFLOXacin 500 MG TABS: 500 | 3 days supply | Qty: 3 | Fill #0

## 2018-12-21 NOTE — Discharge Instructions (Signed)
ABDOMINAL SURGERY: POST OP INSTRUCTIONS  1. DIET: Follow a light bland diet the first 24 hours after arrival home, such as soup, liquids, crackers, etc.  Be sure to include lots of fluids daily.  Avoid fast food or heavy meals as your are more likely to get nauseated.  Do not eat any uncooked fruits or vegetables for the next 2 weeks as your colon heals. 2. Take your usually prescribed home medications unless otherwise directed. 3. PAIN CONTROL: a. Pain is best controlled by a usual combination of three different methods TOGETHER: i. Ice/Heat ii. Over the counter pain medication iii. Prescription pain medication b. Most patients will experience some swelling and bruising around the incisions.  Ice packs or heating pads (30-60 minutes up to 6 times a day) will help. Use ice for the first few days to help decrease swelling and bruising, then switch to heat to help relax tight/sore spots and speed recovery.  Some people prefer to use ice alone, heat alone, alternating between ice & heat.  Experiment to what works for you.  Swelling and bruising can take several weeks to resolve.   c. It is helpful to take an over-the-counter pain medication regularly for the first few weeks.  Choose one of the following that works best for you: i. Naproxen (Aleve, etc)  Two 220mg  tabs twice a day ii. Ibuprofen (Advil, etc) Three 200mg  tabs four times a day (every meal & bedtime) iii. Acetaminophen (Tylenol, etc) 500-650mg  four times a day (every meal & bedtime) d. A  prescription for pain medication (such as oxycodone, hydrocodone, etc) should be given to you upon discharge.  Take your pain medication as prescribed.  i. If you are having problems/concerns with the prescription medicine (does not control pain, nausea, vomiting, rash, itching, etc), please call us [phone] to see if we need to switch you to a different pain medicine that will work better for you and/or control your side effect better. ii. If you  need a refill on your pain medication, please contact your pharmacy.  They will contact our office to request authorization. Prescriptions will not be filled after 5 pm or on week-ends.  4. Wash / shower every day.  You may shower over the incision / wound.  Cover drain with plastic wrap.  Avoid baths until the skin is fully healed.  Continue to shower over incision(s) after the dressing is off. 5. Remove your waterproof bandages 5 days after surgery.  You may leave the incision open to air.  You may replace a dressing/Band-Aid to cover the incision for comfort if you wish. 6. ACTIVITIES as tolerated:   a. You may resume regular (light) daily activities beginning the next day--such as daily self-care, walking, climbing stairs--gradually increasing activities as tolerated.  If you can walk 30 minutes without difficulty, it is safe to try more intense activity such as jogging, treadmill, bicycling, low-impact aerobics, swimming, etc. b. Save the most intensive and strenuous activity for last such as sit-ups, heavy lifting, contact sports, etc  Refrain from any heavy lifting or straining until you are off narcotics for pain control.   c. DO NOT PUSH THROUGH PAIN.  Let pain be your guide: If it hurts to do something, don't do it.  Pain is your body warning you to avoid that activity for another week until the pain goes down. d. You may drive when you are no longer taking prescription pain medication, you can comfortably wear a seatbelt, and you can safely maneuver  your car and apply brakes. e. Dennis Bast may have sexual intercourse when it is comfortable.  7. FOLLOW UP in our office a. Please call [facility] at [phone] to set up an appointment to see your surgeon in the office for a follow-up appointment approximately 1-2 weeks after your surgery. b. Make sure that you call for this appointment the day you arrive home to insure a convenient appointment time. 10. IF YOU HAVE DISABILITY OR FAMILY LEAVE FORMS, BRING  THEM TO THE OFFICE FOR PROCESSING.  DO NOT GIVE THEM TO YOUR DOCTOR.   WHEN TO CALL us [phone]: 1. Poor pain control 2. Reactions / problems with new medications (rash/itching, nausea, etc)  3. Fever over 101.5 F (38.5 C) 4. Inability to urinate 5. Nausea and/or vomiting 6. Worsening swelling or bruising 7. Continued bleeding from incision. 8. Increased pain, redness, or drainage from the incision  The clinic staff is available to answer your questions during regular business hours (8:30am-5pm).  Please dont hesitate to call and ask to speak to one of our nurses for clinical concerns.   A surgeon from [facility] is always on call at the hospitals   If you have a medical emergency, go to the nearest emergency room or call 911.    [facility], [place], [place], [place], [place]  [address] ? MAIN: [phone] ? TOLL FREE: [phone] ? FAX [phone] [email or web address]

## 2018-12-21 NOTE — Consult Note (Addendum)
[place] Nurse ostomy follow up Stoma type/location: RUQ, end ileostomy Stomal assessment/size: 1 1/2" budded stoma,  above skin level, red and viable Peristomal assessment: previously noted erythema surrounding stoma is slowly resolving. Treatment options for stomal/peristomal skin: using 2" barrier ring to aid seal  Output mod amt liquid green stool Ostomy pouching: 1pc convex pouch Education provided:  Pt assisted with pouch application using a mirror.  She is able to open and close velcro to empty without assistance.  Reviewed dietary precautions, importance of avoiding dehydration, ordering supplies, and ostomy pouching and emptying routines.  Pt asks appropriate questions and appears to be well informed.  4 sets of barrier rings and convex pouches left in the room with educational materials. Pt states she plans to have home health assistance after discharge. Enrolled patient in [place] Start Discharge program: Yes, previously Julien Girt MSN, [place], [place], [place], [place]

## 2018-12-21 NOTE — Discharge Summary (Signed)
Physician Discharge Summary  Patient ID: Samantha Reynolds MRN: [id number] DOB/AGE: 1965-10-01 53 y.o.  Admit date: 12/07/2018 Discharge date: 12/21/2018  Admission Diagnoses:     Principal Problem:   Malignant neoplasm of rectum [facility]) Active Problems:   History of thyroid cancer   Samantha Reynolds syndrome   Hyperlipidemia   GERD (gastroesophageal reflux disease)   Anxiety   Hypokalemia   H/O Hashimoto thyroiditis   Hypothyroidism  Discharge Diagnoses:  Principal Problem:   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[name]) Active Problems:   History of thyroid cancer   Gilbert syndrome   Hyperlipidemia   GERD (gastroesophageal reflux disease)   Anxiety   Hypokalemia   H/O Hashimoto thyroiditis   Hypothyroidism   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  By postop day 3 she had developed increasing tachycardia and abdominal pain.  A CT was performed.  This showed a fluid collection in the abdominal wall consistent with an ileostomy leak.  She was taken back to the operating room and the leak was repaired.  The abdominal wall was washed out and a drain was placed.  Her abdomen was evaluated laparoscopically and no sign of complication was noted internally.  She had a significant postoperative ileus after that.  Once she began to have good bowel function, her NG tube was removed and she was started on a diet.  She then developed high output ileostomy and was placed on fiber and Imodium.  Once that was controlled, she was felt to be in stable condition for discharge to home.  Consults: None  Significant Diagnostic Studies: labs: cbc. cmet  Treatments: IV hydration, antibiotics: Cipro and metronidazole, analgesia: acetaminophen and surgery: Robotic assisted low anterior resection, ileostomy revision  Discharge Exam: Blood pressure 102/73, pulse 86,  temperature 98.4 F (36.9 C), temperature source Oral, resp. rate 18, height 5\' 6"  (1.676 m), weight 71 kg, last menstrual period 02/16/2015, SpO2 100 %. General appearance: alert and cooperative GI: normal findings: soft, non-tender Incision/Wound: clean  Disposition: home with home health   Allergies as of 12/21/2018      Reactions   Demerol [meperidine] Itching   All over the body.   Penicillins Hives    childhood, does not remember if it spread all over the body or not. Did it involve swelling of the face/tongue/throat, SOB, or low BP? No Did it involve sudden or severe rash/hives, skin peeling, or any reaction on the inside of your mouth or nose? Yes Did you need to seek medical attention at a hospital or doctor's office? Yes When did it last happen?childhood allergy If all above answers are "NO", may proceed with cephalosporin use.   Other Hives, Other (See Comments)   Fresh coconut    Vancomycin Rash   Will need Benadryl prior to administration per IV. "Red man syndrome"      Medication List    TAKE these medications   ALPRAZolam 0.5 MG tablet Commonly known as: XANAX Patient may take a tablet during the day may use up to 1-1/2 tablets at night to help with sleep   levofloxacin 500 MG tablet Commonly known as: LEVAQUIN Take 1 tablet (500 mg total) by mouth daily.   lidocaine-prilocaine cream Commonly known as: EMLA Apply small amount to port site one hour prior to appointment and cover with plastic wrap. What changed:   how much to take  how to take this  when to take this  reasons to take this  additional instructions   loperamide 2 MG capsule Commonly known as: IMODIUM Take 1 capsule (2 mg total) by mouth 3 (three) times daily.   ondansetron 4 MG tablet Commonly known as: ZOFRAN Take 1 tablet (4 mg total) by mouth every 8 (eight) hours as needed for nausea or vomiting.   polycarbophil 625 MG tablet Commonly known as: FIBERCON Take 1 tablet  (625 mg total) by mouth 2 (two) times daily.   potassium chloride SA 20 MEQ tablet Commonly known as: K-DUR Take 1 tablet (20 mEq total) by mouth daily. What changed: when to take this   prochlorperazine 10 MG tablet Commonly known as: COMPAZINE Take 10 mg by mouth every 6 (six) hours as needed for nausea or vomiting.   thyroid 90 MG tablet Commonly known as: ARMOUR Take 90 mg by mouth daily before breakfast.   vitamin B-12 1000 MCG tablet Commonly known as: CYANOCOBALAMIN Take 1,000 mcg by mouth daily.      Follow-up Information    Leighton Ruff, MD Follow up.   Specialty: General Surgery Why: as scheduled Contact information: [place] [address] [phone]           Signed: Rosario Adie [id number], 9:05 AM

## 2018-12-22 DIAGNOSIS — Z4801 Encounter for change or removal of surgical wound dressing: Secondary | ICD-10-CM | POA: Diagnosis not present

## 2018-12-22 DIAGNOSIS — C20 Malignant neoplasm of rectum: Secondary | ICD-10-CM | POA: Diagnosis not present

## 2018-12-22 DIAGNOSIS — Z483 Aftercare following surgery for neoplasm: Secondary | ICD-10-CM | POA: Diagnosis not present

## 2018-12-22 DIAGNOSIS — Z432 Encounter for attention to ileostomy: Secondary | ICD-10-CM | POA: Diagnosis not present

## 2018-12-22 DIAGNOSIS — E890 Postprocedural hypothyroidism: Secondary | ICD-10-CM | POA: Diagnosis not present

## 2018-12-22 DIAGNOSIS — K219 Gastro-esophageal reflux disease without esophagitis: Secondary | ICD-10-CM | POA: Diagnosis not present

## 2018-12-22 DIAGNOSIS — E785 Hyperlipidemia, unspecified: Secondary | ICD-10-CM | POA: Diagnosis not present

## 2018-12-22 DIAGNOSIS — C787 Secondary malignant neoplasm of liver and intrahepatic bile duct: Secondary | ICD-10-CM | POA: Diagnosis not present

## 2018-12-24 ENCOUNTER — Encounter: Payer: Self-pay | Admitting: *Deleted

## 2018-12-24 ENCOUNTER — Telehealth: Payer: Self-pay | Admitting: Family Medicine

## 2018-12-24 ENCOUNTER — Other Ambulatory Visit: Payer: Self-pay | Admitting: *Deleted

## 2018-12-24 NOTE — Telephone Encounter (Signed)
[facility] is verbal orders for skilled nursing  For 2 times a week for 2 weeks and once a week for 2 weeks for drainage management,coloscopy tare. [phone]

## 2018-12-24 NOTE — Patient Outreach (Signed)
[facility]) Care Management  12/24/2018  Samantha Reynolds 06-Aug-1965 [id number]  Transition of care call/case closure   Referral received: 11/05/18 Initial outreach: 12/24/18 Insurance: [place]  This RNCM is familiar with patient due to previous transition of care call done 04/02/18.   Subjective: Initial successful telephone call to patient's preferred (mobile)  number in order to complete transition of care assessment; 2 HIPAA identifiers verified. Explained purpose of call and completed transition of care assessment.  Samantha Reynolds states she is doing OK except for extreme fatigue that does not seem to be getting better, says surgical stab wound incisions are unremarkable, states surgical pain well managed with over the counter medications, tolerating diet, states ostomy appliance intact with no leaks and stoma is beefy red in color with some mild peristomal skin irritation. She says that she and her husband changed the ostomy appliance this morning. Denies  bladder problems.  Spouse and son are assisting with her recovery.  Samantha Reynolds is aware of the importance of staying hydrated since she is losing water via her ileostomy and she is keeping track of the amount to fluid she is emptying from the ostomy bag. She is also keeping track of the JP drainage and flushing the JP drain daily with saline.  She says the home health nurse did an initial intake visit over the weekend and that they will make 2 more visits this week and next week and then once weekly visits  x 2 weeks.  She denies any ongoing health issues and says she does not need a referral to one of the [place] chronic disease management programs.  She says she does not have the hospital indemnity She says she uses a [facility] outpatient pharmacy.  She denies educational needs related to staying safe during the COVID 19 pandemic.    Objective:  Samantha Reynolds was hospitalized at [facility] from 9/11-9/22/2020  for robotic assisted lower anterior resection, splenic flexure immobilization, coloanal anastomosis, rigid proctoscopy and diverting loop ileostomy to treat rectal cancer with metastasis to the liver. She returned to surgery on 9/15 to repair an ileostomy leak.  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    Assessment:  Patient voices good understanding of all discharge instructions.  See transition of care flowsheet for assessment details.   Plan:  Allowed Samantha Reynolds to voice her frustration of chronic fatigue and emotional support provided. Encouraged her to take one day at a time and give her body sufficient time to heal from the surgery and prolonged hospitalization.  Reviewed hospital discharge diagnosis of abdominal surgery with creation of ileostomy   and discharge treatment plan using hospital discharge instructions, assessing medication adherence, reviewing problems requiring provider notification, and discussing the importance of follow up with surgeon and specialists as directed. No ongoing care management needs identified so will close case to [place] Management services and route successful outreach letter with [place] Management pamphlet and 24 Hour Nurse Line Magnet to [place] [facility] clinical pool to be mailed to patient's home address. Thanked patient for their services to [facility].  Barrington Ellison RN,CCM,CDE [place] Management Coordinator Office Phone  [phone] Office Fax [phone]

## 2018-12-24 NOTE — Telephone Encounter (Signed)
Perfectly fine to do so thank you 

## 2018-12-24 NOTE — Telephone Encounter (Signed)
Samantha Reynolds contacted and verbalized understanding.

## 2018-12-27 DIAGNOSIS — C20 Malignant neoplasm of rectum: Secondary | ICD-10-CM | POA: Diagnosis not present

## 2018-12-27 DIAGNOSIS — C787 Secondary malignant neoplasm of liver and intrahepatic bile duct: Secondary | ICD-10-CM | POA: Diagnosis not present

## 2018-12-27 DIAGNOSIS — K219 Gastro-esophageal reflux disease without esophagitis: Secondary | ICD-10-CM | POA: Diagnosis not present

## 2018-12-27 DIAGNOSIS — Z4801 Encounter for change or removal of surgical wound dressing: Secondary | ICD-10-CM | POA: Diagnosis not present

## 2018-12-27 DIAGNOSIS — Z483 Aftercare following surgery for neoplasm: Secondary | ICD-10-CM | POA: Diagnosis not present

## 2018-12-27 DIAGNOSIS — E785 Hyperlipidemia, unspecified: Secondary | ICD-10-CM | POA: Diagnosis not present

## 2018-12-27 DIAGNOSIS — E890 Postprocedural hypothyroidism: Secondary | ICD-10-CM | POA: Diagnosis not present

## 2018-12-27 DIAGNOSIS — Z432 Encounter for attention to ileostomy: Secondary | ICD-10-CM | POA: Diagnosis not present

## 2018-12-28 DIAGNOSIS — Z932 Ileostomy status: Secondary | ICD-10-CM | POA: Diagnosis not present

## 2018-12-31 ENCOUNTER — Encounter (HOSPITAL_COMMUNITY)
Admission: RE | Admit: 2018-12-31 | Discharge: 2018-12-31 | Disposition: A | Discharge status: Home / Self Care | Payer: 59 | Source: Ambulatory Visit | Attending: Hematology | Admitting: Hematology

## 2018-12-31 ENCOUNTER — Other Ambulatory Visit: Payer: Self-pay

## 2018-12-31 ENCOUNTER — Encounter (HOSPITAL_COMMUNITY): Payer: Self-pay

## 2018-12-31 ENCOUNTER — Encounter (HOSPITAL_COMMUNITY): Payer: Self-pay | Admitting: *Deleted

## 2018-12-31 DIAGNOSIS — Z923 Personal history of irradiation: Secondary | ICD-10-CM | POA: Insufficient documentation

## 2018-12-31 DIAGNOSIS — E039 Hypothyroidism, unspecified: Secondary | ICD-10-CM | POA: Insufficient documentation

## 2018-12-31 DIAGNOSIS — Z87891 Personal history of nicotine dependence: Secondary | ICD-10-CM | POA: Diagnosis not present

## 2018-12-31 DIAGNOSIS — Z8249 Family history of ischemic heart disease and other diseases of the circulatory system: Secondary | ICD-10-CM | POA: Insufficient documentation

## 2018-12-31 DIAGNOSIS — C20 Malignant neoplasm of rectum: Secondary | ICD-10-CM | POA: Insufficient documentation

## 2018-12-31 DIAGNOSIS — R5383 Other fatigue: Secondary | ICD-10-CM | POA: Insufficient documentation

## 2018-12-31 DIAGNOSIS — Z801 Family history of malignant neoplasm of trachea, bronchus and lung: Secondary | ICD-10-CM | POA: Insufficient documentation

## 2018-12-31 DIAGNOSIS — Z9221 Personal history of antineoplastic chemotherapy: Secondary | ICD-10-CM | POA: Diagnosis not present

## 2018-12-31 DIAGNOSIS — R531 Weakness: Secondary | ICD-10-CM | POA: Insufficient documentation

## 2018-12-31 DIAGNOSIS — R110 Nausea: Secondary | ICD-10-CM | POA: Diagnosis not present

## 2018-12-31 DIAGNOSIS — Z808 Family history of malignant neoplasm of other organs or systems: Secondary | ICD-10-CM | POA: Insufficient documentation

## 2018-12-31 DIAGNOSIS — F329 Major depressive disorder, single episode, unspecified: Secondary | ICD-10-CM | POA: Insufficient documentation

## 2018-12-31 DIAGNOSIS — C787 Secondary malignant neoplasm of liver and intrahepatic bile duct: Secondary | ICD-10-CM | POA: Diagnosis not present

## 2018-12-31 DIAGNOSIS — Z85850 Personal history of malignant neoplasm of thyroid: Secondary | ICD-10-CM | POA: Diagnosis not present

## 2018-12-31 DIAGNOSIS — R519 Headache, unspecified: Secondary | ICD-10-CM | POA: Insufficient documentation

## 2018-12-31 DIAGNOSIS — Z79899 Other long term (current) drug therapy: Secondary | ICD-10-CM | POA: Insufficient documentation

## 2018-12-31 DIAGNOSIS — Z8042 Family history of malignant neoplasm of prostate: Secondary | ICD-10-CM | POA: Insufficient documentation

## 2018-12-31 DIAGNOSIS — G479 Sleep disorder, unspecified: Secondary | ICD-10-CM | POA: Insufficient documentation

## 2018-12-31 LAB — COMPREHENSIVE METABOLIC PANEL
ALT: 15 U/L (ref 0–44)
AST: 28 U/L (ref 15–41)
Albumin: 4 g/dL (ref 3.5–5.0)
Alkaline Phosphatase: 249 U/L — ABNORMAL HIGH (ref 38–126)
Anion gap: 11 (ref 5–15)
BUN: 18 mg/dL (ref 6–20)
CO2: 24 mmol/L (ref 22–32)
Calcium: 9.3 mg/dL (ref 8.9–10.3)
Chloride: 103 mmol/L (ref 98–111)
Creatinine, Ser: 0.77 mg/dL (ref 0.44–1.00)
GFR calc Af Amer: >60 mL/min (ref >60)
GFR calc non Af Amer: >60 mL/min (ref >60)
Glucose, Bld: 112 mg/dL — ABNORMAL HIGH (ref 70–99)
Potassium: 3.4 mmol/L — ABNORMAL LOW (ref 3.5–5.1)
Sodium: 138 mmol/L (ref 135–145)
Total Bilirubin: 1.4 mg/dL — ABNORMAL HIGH (ref 0.3–1.2)
Total Protein: 7.6 g/dL (ref 6.5–8.1)

## 2018-12-31 LAB — CBC WITH DIFFERENTIAL/PLATELET
Abs Immature Granulocytes: 0.03 10*3/uL (ref 0.00–0.07)
Basophils Absolute: 0 10*3/uL (ref 0.0–0.1)
Basophils Relative: 0 %
Eosinophils Absolute: 0.1 10*3/uL (ref 0.0–0.5)
Eosinophils Relative: 1 %
HCT: 31.4 % — ABNORMAL LOW (ref 36.0–46.0)
Hemoglobin: 10.5 g/dL — ABNORMAL LOW (ref 12.0–15.0)
Immature Granulocytes: 1 %
Lymphocytes Relative: 16 %
Lymphs Abs: 0.6 10*3/uL — ABNORMAL LOW (ref 0.7–4.0)
MCH: 32.6 pg (ref 26.0–34.0)
MCHC: 33.4 g/dL (ref 30.0–36.0)
MCV: 97.5 fL (ref 80.0–100.0)
Monocytes Absolute: 0.3 10*3/uL (ref 0.1–1.0)
Monocytes Relative: 9 %
Neutro Abs: 2.8 10*3/uL (ref 1.7–7.7)
Neutrophils Relative %: 73 %
Platelets: 199 10*3/uL (ref 150–400)
RBC: 3.22 MIL/uL — ABNORMAL LOW (ref 3.87–5.11)
RDW: 15.8 % — ABNORMAL HIGH (ref 11.5–15.5)
WBC: 3.8 10*3/uL — ABNORMAL LOW (ref 4.0–10.5)
nRBC: 0 % (ref 0.0–0.2)

## 2018-12-31 LAB — MAGNESIUM: Magnesium: 2.2 mg/dL (ref 1.7–2.4)

## 2018-12-31 MED ORDER — MAGNESIUM SULFATE 2 GM/50ML IV SOLN
2.0000 g | Freq: Once | INTRAVENOUS | Status: AC
  Administered 2018-12-31: 2 g via INTRAVENOUS
  Filled 2018-12-31: qty 50

## 2018-12-31 MED ORDER — POTASSIUM CHLORIDE IN NACL 20-0.9 MEQ/L-% IV SOLN
INTRAVENOUS | Status: AC
  Administered 2018-12-31: 15:00:00 via INTRAVENOUS

## 2018-12-31 MED ORDER — HEPARIN SOD (PORK) LOCK FLUSH 100 UNIT/ML IV SOLN
500.0000 [IU] | Freq: Once | INTRAVENOUS | Status: AC
  Administered 2018-12-31: 500 [IU] via INTRAVENOUS

## 2018-12-31 NOTE — Progress Notes (Signed)
Patient called the clinic stating that she has had increased fatigue, zero energy, inability to stand due to no stamina.  She is hypotensive, tachycardic, nauseous.  She is requesting to come in for fluids and labs.  Orders received.  Patient will report to short stay center for labs and then Dr. Delton Coombes will give orders for fluids based on lab work results.    I spoke with Tomi , RN in short stay to give report and orders from Dr. Delton Coombes. She will notify patient.

## 2019-01-01 DIAGNOSIS — N3090 Cystitis, unspecified without hematuria: Secondary | ICD-10-CM | POA: Diagnosis not present

## 2019-01-03 ENCOUNTER — Other Ambulatory Visit: Payer: Self-pay | Admitting: *Deleted

## 2019-01-03 NOTE — Patient Outreach (Signed)
[facility]) Care Management  01/03/2019  Samantha Reynolds 07/12/1965 [id number]  Monthly telephonic [facility] case management review with [facility] and Dr. Alonza Bogus. Update provided to include: Transition of care call completed 9/28. Patient's husband is Microbiologist. Patient is having difficulty staying hydrated related to ostomy output. She received IVF as OP at [facility] on 10/5. Labs- Mg, CBC, CMET were also checked at that time.  Barrington Ellison RN,CCM,CDE [place] Management Coordinator Office Phone [phone] Office Fax [phone]

## 2019-01-07 ENCOUNTER — Encounter (HOSPITAL_COMMUNITY): Payer: Self-pay | Admitting: *Deleted

## 2019-01-07 NOTE — Progress Notes (Signed)
Patient called clinic reporting continued fatigue/weakness. She states that it is minimally better than last week.  She is asking if she can have fluids again tomorrow while she is here for labs.  Per Dr. Delton Coombes, patient is to have CBC w/diff, CMP, Magnesium drawn tomorrow and then based on the results he will give orders for her fluids.

## 2019-01-08 ENCOUNTER — Encounter (HOSPITAL_COMMUNITY): Payer: Self-pay | Admitting: *Deleted

## 2019-01-08 ENCOUNTER — Other Ambulatory Visit: Payer: Self-pay

## 2019-01-08 ENCOUNTER — Encounter (HOSPITAL_COMMUNITY): Payer: Self-pay

## 2019-01-08 ENCOUNTER — Encounter (HOSPITAL_COMMUNITY)
Admission: RE | Admit: 2019-01-08 | Discharge: 2019-01-08 | Disposition: A | Discharge status: Home / Self Care | Payer: 59 | Source: Ambulatory Visit | Attending: Hematology | Admitting: Hematology

## 2019-01-08 ENCOUNTER — Inpatient Hospital Stay (HOSPITAL_COMMUNITY): Payer: 59 | Attending: Hematology

## 2019-01-08 DIAGNOSIS — Z85850 Personal history of malignant neoplasm of thyroid: Secondary | ICD-10-CM | POA: Insufficient documentation

## 2019-01-08 DIAGNOSIS — R110 Nausea: Secondary | ICD-10-CM | POA: Diagnosis not present

## 2019-01-08 DIAGNOSIS — Z801 Family history of malignant neoplasm of trachea, bronchus and lung: Secondary | ICD-10-CM | POA: Insufficient documentation

## 2019-01-08 DIAGNOSIS — Z9221 Personal history of antineoplastic chemotherapy: Secondary | ICD-10-CM | POA: Insufficient documentation

## 2019-01-08 DIAGNOSIS — C787 Secondary malignant neoplasm of liver and intrahepatic bile duct: Secondary | ICD-10-CM | POA: Insufficient documentation

## 2019-01-08 DIAGNOSIS — Z808 Family history of malignant neoplasm of other organs or systems: Secondary | ICD-10-CM | POA: Insufficient documentation

## 2019-01-08 DIAGNOSIS — G479 Sleep disorder, unspecified: Secondary | ICD-10-CM | POA: Insufficient documentation

## 2019-01-08 DIAGNOSIS — R519 Headache, unspecified: Secondary | ICD-10-CM | POA: Diagnosis not present

## 2019-01-08 DIAGNOSIS — Z8249 Family history of ischemic heart disease and other diseases of the circulatory system: Secondary | ICD-10-CM | POA: Insufficient documentation

## 2019-01-08 DIAGNOSIS — E039 Hypothyroidism, unspecified: Secondary | ICD-10-CM | POA: Diagnosis not present

## 2019-01-08 DIAGNOSIS — Z87891 Personal history of nicotine dependence: Secondary | ICD-10-CM | POA: Insufficient documentation

## 2019-01-08 DIAGNOSIS — F329 Major depressive disorder, single episode, unspecified: Secondary | ICD-10-CM | POA: Insufficient documentation

## 2019-01-08 DIAGNOSIS — R5383 Other fatigue: Secondary | ICD-10-CM | POA: Diagnosis not present

## 2019-01-08 DIAGNOSIS — R531 Weakness: Secondary | ICD-10-CM | POA: Insufficient documentation

## 2019-01-08 DIAGNOSIS — Z923 Personal history of irradiation: Secondary | ICD-10-CM | POA: Insufficient documentation

## 2019-01-08 DIAGNOSIS — Z79899 Other long term (current) drug therapy: Secondary | ICD-10-CM | POA: Insufficient documentation

## 2019-01-08 DIAGNOSIS — C20 Malignant neoplasm of rectum: Secondary | ICD-10-CM | POA: Diagnosis not present

## 2019-01-08 DIAGNOSIS — Z8042 Family history of malignant neoplasm of prostate: Secondary | ICD-10-CM | POA: Insufficient documentation

## 2019-01-08 LAB — CBC WITH DIFFERENTIAL/PLATELET
Abs Immature Granulocytes: 0.01 10*3/uL (ref 0.00–0.07)
Basophils Absolute: 0 10*3/uL (ref 0.0–0.1)
Basophils Relative: 0 %
Eosinophils Absolute: 0 10*3/uL (ref 0.0–0.5)
Eosinophils Relative: 1 %
HCT: 30.9 % — ABNORMAL LOW (ref 36.0–46.0)
Hemoglobin: 10.2 g/dL — ABNORMAL LOW (ref 12.0–15.0)
Immature Granulocytes: 0 %
Lymphocytes Relative: 10 %
Lymphs Abs: 0.3 10*3/uL — ABNORMAL LOW (ref 0.7–4.0)
MCH: 32.6 pg (ref 26.0–34.0)
MCHC: 33 g/dL (ref 30.0–36.0)
MCV: 98.7 fL (ref 80.0–100.0)
Monocytes Absolute: 0.3 10*3/uL (ref 0.1–1.0)
Monocytes Relative: 9 %
Neutro Abs: 2.6 10*3/uL (ref 1.7–7.7)
Neutrophils Relative %: 80 %
Platelets: 141 10*3/uL — ABNORMAL LOW (ref 150–400)
RBC: 3.13 MIL/uL — ABNORMAL LOW (ref 3.87–5.11)
RDW: 15.5 % (ref 11.5–15.5)
WBC: 3.3 10*3/uL — ABNORMAL LOW (ref 4.0–10.5)
nRBC: 0 % (ref 0.0–0.2)

## 2019-01-08 LAB — COMPREHENSIVE METABOLIC PANEL
ALT: 16 U/L (ref 0–44)
AST: 30 U/L (ref 15–41)
Albumin: 4.2 g/dL (ref 3.5–5.0)
Alkaline Phosphatase: 170 U/L — ABNORMAL HIGH (ref 38–126)
Anion gap: 10 (ref 5–15)
BUN: 15 mg/dL (ref 6–20)
CO2: 24 mmol/L (ref 22–32)
Calcium: 9.2 mg/dL (ref 8.9–10.3)
Chloride: 102 mmol/L (ref 98–111)
Creatinine, Ser: 0.9 mg/dL (ref 0.44–1.00)
GFR calc Af Amer: >60 mL/min (ref >60)
GFR calc non Af Amer: >60 mL/min (ref >60)
Glucose, Bld: 97 mg/dL (ref 70–99)
Potassium: 3.9 mmol/L (ref 3.5–5.1)
Sodium: 136 mmol/L (ref 135–145)
Total Bilirubin: 2.7 mg/dL — ABNORMAL HIGH (ref 0.3–1.2)
Total Protein: 7.3 g/dL (ref 6.5–8.1)

## 2019-01-08 LAB — MAGNESIUM: Magnesium: 2.2 mg/dL (ref 1.7–2.4)

## 2019-01-08 MED ORDER — SODIUM CHLORIDE 0.9 % IV SOLN
Freq: Once | INTRAVENOUS | Status: AC
  Administered 2019-01-08: 12:00:00 via INTRAVENOUS

## 2019-01-08 MED ORDER — HEPARIN SOD (PORK) LOCK FLUSH 100 UNIT/ML IV SOLN
500.0000 [IU] | Freq: Once | INTRAVENOUS | Status: AC
  Administered 2019-01-08: 500 [IU] via INTRAVENOUS

## 2019-01-08 NOTE — Progress Notes (Signed)
Per Dr. Delton Coombes, based on today's labs patient is to receive Normal saline 1044ml over 2 hours.  Report given to Tomi, RN

## 2019-01-09 ENCOUNTER — Other Ambulatory Visit: Payer: Self-pay

## 2019-01-09 DIAGNOSIS — Z432 Encounter for attention to ileostomy: Secondary | ICD-10-CM | POA: Diagnosis not present

## 2019-01-09 DIAGNOSIS — C20 Malignant neoplasm of rectum: Secondary | ICD-10-CM | POA: Diagnosis not present

## 2019-01-09 DIAGNOSIS — C787 Secondary malignant neoplasm of liver and intrahepatic bile duct: Secondary | ICD-10-CM | POA: Diagnosis not present

## 2019-01-09 DIAGNOSIS — Z4801 Encounter for change or removal of surgical wound dressing: Secondary | ICD-10-CM | POA: Diagnosis not present

## 2019-01-09 DIAGNOSIS — E785 Hyperlipidemia, unspecified: Secondary | ICD-10-CM | POA: Diagnosis not present

## 2019-01-09 DIAGNOSIS — E890 Postprocedural hypothyroidism: Secondary | ICD-10-CM | POA: Diagnosis not present

## 2019-01-09 DIAGNOSIS — K219 Gastro-esophageal reflux disease without esophagitis: Secondary | ICD-10-CM | POA: Diagnosis not present

## 2019-01-09 DIAGNOSIS — Z483 Aftercare following surgery for neoplasm: Secondary | ICD-10-CM | POA: Diagnosis not present

## 2019-01-10 ENCOUNTER — Encounter (HOSPITAL_COMMUNITY)
Admission: RE | Admit: 2019-01-10 | Discharge: 2019-01-10 | Disposition: A | Discharge status: Home / Self Care | Payer: 59 | Source: Ambulatory Visit | Attending: Hematology | Admitting: Hematology

## 2019-01-10 ENCOUNTER — Inpatient Hospital Stay (HOSPITAL_BASED_OUTPATIENT_CLINIC_OR_DEPARTMENT_OTHER): Payer: 59 | Admitting: Hematology

## 2019-01-10 ENCOUNTER — Encounter (HOSPITAL_COMMUNITY): Payer: Self-pay | Admitting: Hematology

## 2019-01-10 VITALS — BP 94/68 | HR 110 | Temp 97.5°F | Resp 20 | Wt 139.3 lb

## 2019-01-10 DIAGNOSIS — R110 Nausea: Secondary | ICD-10-CM | POA: Diagnosis not present

## 2019-01-10 DIAGNOSIS — C20 Malignant neoplasm of rectum: Secondary | ICD-10-CM | POA: Diagnosis not present

## 2019-01-10 DIAGNOSIS — G479 Sleep disorder, unspecified: Secondary | ICD-10-CM | POA: Diagnosis not present

## 2019-01-10 DIAGNOSIS — E063 Autoimmune thyroiditis: Secondary | ICD-10-CM | POA: Diagnosis not present

## 2019-01-10 DIAGNOSIS — E038 Other specified hypothyroidism: Secondary | ICD-10-CM | POA: Diagnosis not present

## 2019-01-10 DIAGNOSIS — E039 Hypothyroidism, unspecified: Secondary | ICD-10-CM | POA: Diagnosis not present

## 2019-01-10 DIAGNOSIS — R531 Weakness: Secondary | ICD-10-CM | POA: Diagnosis not present

## 2019-01-10 DIAGNOSIS — R5383 Other fatigue: Secondary | ICD-10-CM | POA: Diagnosis not present

## 2019-01-10 DIAGNOSIS — F329 Major depressive disorder, single episode, unspecified: Secondary | ICD-10-CM | POA: Diagnosis not present

## 2019-01-10 DIAGNOSIS — R519 Headache, unspecified: Secondary | ICD-10-CM | POA: Diagnosis not present

## 2019-01-10 DIAGNOSIS — C787 Secondary malignant neoplasm of liver and intrahepatic bile duct: Secondary | ICD-10-CM | POA: Diagnosis not present

## 2019-01-10 LAB — CORTISOL: Cortisol, Plasma: 5.6 ug/dL

## 2019-01-10 LAB — T4, FREE: Free T4: 0.8 ng/dL (ref 0.61–1.12)

## 2019-01-10 LAB — TSH: TSH: 0.136 u[IU]/mL — ABNORMAL LOW (ref 0.350–4.500)

## 2019-01-10 MED ORDER — TRAZODONE HCL 50 MG PO TABS: 50.0000 mg | ORAL_TABLET | Freq: Every evening | ORAL | 0 refills | Status: DC | PRN

## 2019-01-10 MED FILL — traZODone HCL 50 MG TABS: 50 | 10 days supply | Qty: 10 | Fill #0

## 2019-01-10 NOTE — Assessment & Plan Note (Addendum)
1.  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 - Colonoscopy on 02/19/2018 shows rectal mass, 2 to 4 cm from the anal verge, normal sigmoid colon.  Biopsies consistent with adenocarcinoma. - MRI of the pelvis shows rectal adenocarcinoma, T stage-T3, N stage- N1-equivocal N2 nodes.  Maximum extension beyond muscularis propria is 6 mm (advanced T3).  Circumferential resection margin is 7 mm.  Right obturator node 6 mm.  Right external iliac node at 8 mm.  Craniocaudal extent of the tumor is 4.4 cm.  Approximately 180 degree circumferential.  Shortest distance from the distal tumor to anal sphincter is 2.1 cm.  Location from anal verge, 7.2 cm to the middle of the tumor. - CT CAP on 02/21/2018 shows 9 x 7 mm left lower lobe pulmonary nodule, new since 2009.  Possible subpleural left lower lobe 3 mm nodule versus area of pleural thickening.  Vague area of hypoattenuation in the posterior right hepatic lobe measuring 1.6 cm.  Differential includes benign/malignant lesion with altered perfusion.  Focal steatosis is possible. - MRI of the liver on 03/08/2018 shows 1.4 cm lesion in the posterior right hepatic lobe.  There is a 5 mm focus of delayed hyperenhancement in the lateral segment of the left liver, questionable for a second metastatic deposit. -PET CT scan on 03/05/2018 shows hypermetabolic rectal mass.  Left perirectal hypermetabolic and sigmoid mesocolon lymph nodes.  Single hepatic metastatic lesion in the right hepatic lobe.  7 mm pulmonary nodule at the left lung base is suspicious for meta stasis. -Liver biopsy on 03/09/2018 consistent with adenocarcinoma.  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  Low starting dose  of Irinotecan is indicated. - 7 cycles of FOLFOX with vectibix completed on 06/27/2018.  Vectibix was left out during cycle 7.   - PET scan on 06/21/2018 showed interval resolution of the hypermetabolic area associated with right lobe of the liver metastasis.  Spleen has slightly enlarged to 13.4 cm in length and 12.9 cm previously.  Interval decrease in the radiotracer uptake associated with rectal neoplasm with SUV of 5 compared to 17.5 previously.  Previously noted FDG avid sigmoid mesocolon lymph nodes have resolved. - MRI of the liver on 06/25/2018 shows decrease in size of the liver metastasis to 5 mm, from 15 mm previously.  No new lesions noted. - XRT with Xeloda from 07/30/2018-09/06/2018.  Xeloda stopped on 09/05/2018 secondary to elevated bilirubin.  -CT CAP on 09/27/2018 shows stable posttreatment findings of rectal neoplasm, metastatic liver lesion and left lung nodule compared to PET CT scan from 06/21/2018.  No new metastatic disease. -Right liver lesion microwave ablation on 10/17/2018. -Low anterior resection with diverting ileostomy on 6/56/8127, complicated by ileostomy dysfunction requiring surgery POD 4. -Pathology shows YPT2Y PN 0, 0/6 lymph nodes positive, margins negative.  We discussed path report in detail. -She reports 500-700 mL of output per day.  She is drinking about 16 ounces of body armor, 16 ounces of Crystal light and 1 big cup of iced tea.  She is also urinating frequently. -Her blood pressure is 94/68 and pulse rate is 110.  Respirate is 20.  She denies any pain but has occasional twinges at the ostomy site. -I will check her TSH and  free T4.  We will also check her cortisol level.  I will send a CEA level. -We have scheduled her for CT CAP next Thursday.  I will see her back in 2 weeks to discuss results.  2.  Difficulty sleeping: -She is taking 1.5 mg of Xanax at bedtime.  But she wakes up in the middle of the night.  She does not want to try Ambien. -I will give her  trazodone 50 mg at bedtime as needed.  She was told not to take Xanax at bedtime with it. -I will be very low threshold to add an antidepressant next.  3.  Weight loss: -She had lost some weight since surgery.  She does not have good appetite.  This is slowly improving over time.  4.  Nausea: -She reports nausea and severe weakness every evening.  Compazine is helping with the nausea.  Zofran did not help.

## 2019-01-10 NOTE — Patient Instructions (Addendum)
[facility] at [facility] Discharge Instructions  You were seen today by Dr. Katragadda. He went over your recent lab results. He will see you back in 2 weeks for labs and follow up.   Thank you for choosing [facility] at [facility] to provide your oncology and hematology care.  To afford each patient quality time with our provider, please arrive at least 15 minutes before your scheduled appointment time.   If you have a lab appointment with the Cancer Center please come in thru the  Main Entrance and check in at the main information desk  You need to re-schedule your appointment should you arrive 10 or more minutes late.  We strive to give you quality time with our providers, and arriving late affects you and other patients whose appointments are after yours.  Also, if you no show three or more times for appointments you may be dismissed from the clinic at the providers discretion.     Again, thank you for choosing [facility].  Our hope is that these requests will decrease the amount of time that you wait before being seen by our physicians.       _____________________________________________________________  Should you have questions after your visit to [facility], please contact our office at [phone] between the hours of 8:00 a.m. and 4:30 p.m.  Voicemails left after 4:00 p.m. will not be returned until the following business day.  For prescription refill requests, have your pharmacy contact our office and allow 72 hours.    Cancer Center Support Programs:   > Cancer Support Group  2nd Tuesday of the month 1pm-2pm, Journey Room    

## 2019-01-10 NOTE — Progress Notes (Signed)
[place], [place] [address]   CLINIC:  Medical Oncology/Hematology  PCP:  [name], MD [address] [place] [address] [phone]   REASON FOR VISIT:  Follow-up for rectal cancer   BRIEF ONCOLOGIC HISTORY:  Oncology History  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[place])  02/26/2018 Initial Diagnosis   Rectal cancer ([place])   03/13/2018 Cancer Staging   Staging form: Colon and Rectum, AJCC 8th Edition - Clinical stage from 03/13/2018: Stage IVA (cT3, cN1b, cM1a) - Signed by Derek Jack, MD on 03/13/2018   03/14/2018 - 07/10/2018 Chemotherapy   The patient had palonosetron (ALOXI) injection 0.25 mg, 0.25 mg, Intravenous,  Once, 7 of 8 cycles Administration: 0.25 mg (03/14/2018), 0.25 mg (03/27/2018), 0.25 mg (04/18/2018), 0.25 mg (05/02/2018), 0.25 mg (05/16/2018), 0.25 mg (06/05/2018), 0.25 mg (06/27/2018) leucovorin 800 mg in dextrose 5 % 250 mL infusion, 772 mg, Intravenous,  Once, 7 of 8 cycles Administration: 800 mg (03/14/2018), 800 mg (03/27/2018), 800 mg (05/02/2018), 800 mg (05/16/2018), 700 mg (04/18/2018), 800 mg (06/05/2018), 800 mg (06/27/2018) oxaliplatin (ELOXATIN) 165 mg in dextrose 5 % 500 mL chemo infusion, 85 mg/m2 = 165 mg, Intravenous,  Once, 6 of 7 cycles Dose modification: 68 mg/m2 (80 % of original dose 85 mg/m2, Cycle 4, Reason: Provider Judgment) Administration: 165 mg (03/14/2018), 165 mg (03/27/2018), 130 mg (05/02/2018), 130 mg (05/16/2018), 130 mg (06/05/2018), 130 mg (06/27/2018) panitumumab (VECTIBIX) 500 mg in sodium chloride 0.9 % 100 mL chemo infusion, 480 mg, Intravenous,  Once, 4 of 5 cycles Administration: 500 mg (04/18/2018), 480 mg (05/02/2018), 480 mg (05/16/2018), 480 mg (06/05/2018) fluorouracil (ADRUCIL) chemo injection 750 mg, 400 mg/m2 = 750 mg (100 % of original dose 400 mg/m2), Intravenous,  Once, 6 of 7 cycles Dose modification: 400 mg/m2 (original dose 400 mg/m2, Cycle 1), 400 mg/m2 (original dose 400  mg/m2, Cycle 3) Administration: 750 mg (03/14/2018), 750 mg (04/18/2018), 750 mg (05/02/2018), 750 mg (05/16/2018), 750 mg (06/05/2018), 750 mg (06/27/2018) fosaprepitant (EMEND) 150 mg, dexamethasone (DECADRON) 12 mg in sodium chloride 0.9 % 145 mL IVPB, , Intravenous,  Once, 4 of 5 cycles Administration:  (05/02/2018),  (05/16/2018),  (06/05/2018),  (06/27/2018) fluorouracil (ADRUCIL) 4,650 mg in sodium chloride 0.9 % 57 mL chemo infusion, 2,400 mg/m2 = 4,650 mg, Intravenous, 1 Day/Dose, 7 of 8 cycles Administration: 4,650 mg (03/14/2018), 4,650 mg (03/27/2018), 4,650 mg (04/18/2018), 4,650 mg (05/02/2018), 4,650 mg (05/16/2018), 4,650 mg (06/05/2018), 4,650 mg (06/27/2018)  for chemotherapy treatment.       CANCER STAGING: Cancer Staging Malignant neoplasm of rectum [facility]) Staging form: Colon and Rectum, AJCC 8th Edition - Clinical stage from 03/13/2018: Stage IVA (cT3, cN1b, cM1a) - Signed by Derek Jack, MD on 03/13/2018    INTERVAL HISTORY:  Ms. Ferrie 53 y.o. female seen for follow-up of rectal cancer.  She had low anterior resection with diverting ileostomy done on 12/07/2018.  She developed ileostomy dysfunction and required surgery again on postoperative day 4.  She reports nausea in the evening times which gets better with Compazine.  Zofran does not work.  She also reports severe weakness in the evenings until she goes to bed.  She is drinking about 16 ounces of body armor along with 60 ounces of Crystal light and 1 cup of iced tea.  She is also urinating multiple times.  She had to receive IV fluids couple of times in the last 1 week.  She reported feeling better after the IV fluids.  Blood  pressure is 94/68 today.  She denies any dizziness.  No palpitations reported.  Energy levels are reported as 50%.  Appetite is low at 25%.  REVIEW OF SYSTEMS:  Review of Systems  Constitutional: Positive for fatigue.  Gastrointestinal: Positive for nausea.  Neurological: Positive for headaches.   Psychiatric/Behavioral: Positive for depression and sleep disturbance.  All other systems reviewed and are negative.    PAST MEDICAL/SURGICAL HISTORY:  Past Medical History:  Diagnosis Date  . Anxiety   . Cancer [facility]) 2013   thyroid  . Endometrial polyp   . Endometrial thickening on ultra sound   . GERD (gastroesophageal reflux disease)   . 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 H/O Hashimoto thyroiditis   . History of chemotherapy   . History of radiation therapy   . 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 Hyperlipidemia 11/09/2015  . Hypothyroidism   . 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[place]) 02/26/2018   Past Surgical History:  Procedure Laterality Date  . BIOPSY  02/19/2018   Procedure: BIOPSY;  Surgeon: Rogene Houston, MD;  [facility];  Service: Endoscopy;;  rectum  . COLONOSCOPY N/A 08/20/2015   Procedure: COLONOSCOPY;  Surgeon: Rogene Houston, MD;  [facility];  Service: Endoscopy;  Laterality: N/A;  730  . [place] ENDOMETRIAL ABLATION  08-13-2004  . DIVERTING ILEOSTOMY N/A 12/07/2018   Procedure: DIVERTING LOOP ILEOSTOMY;  Surgeon: Leighton Ruff, MD;  [facility]: [facility];  Service: General;  Laterality: N/A;  . FLEXIBLE SIGMOIDOSCOPY N/A 02/19/2018   Procedure: FLEXIBLE SIGMOIDOSCOPY;  Surgeon: Rogene Houston, MD;  [facility];  Service: Endoscopy;  Laterality: N/A;  . HYSTEROSCOPY W/D&C N/A 04/30/2015   Procedure: DILATATION AND CURETTAGE / INTENDED HYSTEROSCOPY;  Surgeon: Dian Queen, MD;  Location: [place];  Service: Gynecology;  Laterality: N/A;  . IR US GUIDE BX ASP/DRAIN  03/09/2018  . LAPAROSCOPIC CHOLECYSTECTOMY  04/1996  . LAPAROSCOPY N/A 12/11/2018   Procedure: LAPAROSCOPY  ILEOSTOMY REVISION AND ABDOMINAL WASHOUT;  Surgeon: Leighton Ruff, MD;  [facility]:  [facility];  Service: General;  Laterality: N/A;  . Liver Microwave   10/17/2018  . POLYPECTOMY  08/20/2015   Procedure: POLYPECTOMY;  Surgeon: Rogene Houston, MD;  [facility];  Service: Endoscopy;;  Splenic flexure polypectomy  . POLYPECTOMY  02/19/2018   Procedure: POLYPECTOMY;  Surgeon: Rogene Houston, MD;  [facility];  Service: Endoscopy;;  rectum  . PORTACATH PLACEMENT N/A 03/14/2018   Procedure: INSERTION PORT-A-CATH;  Surgeon: Aviva Signs, MD;  Location: [facility];  Service: General;  Laterality: N/A;  . REDUCTION INCARCERATED UTERUS  06-20-2000   intrauterine preg. 13 wks /  urinary retention  . THYROID LOBECTOMY  11/24/2011   Procedure: THYROID LOBECTOMY;  Surgeon: Earnstine Regal, MD;  [facility];  Service: General;  Laterality: Left;  Left Thyroid Lobectomy  . TUBAL LIGATION  2002  . XI ROBOTIC ASSISTED LOWER ANTERIOR RESECTION N/A 12/07/2018   Procedure: XI ROBOTIC ASSISTED LOWER ANTERIOR RESECTION, SPENIC FLEXURE IMMOBILIZATION, COLOANAL ANASTOMOSIS, RIGID PROCTOSCOPY;  Surgeon: Leighton Ruff, MD;  [facility];  Service: General;  Laterality: N/A;     SOCIAL HISTORY:  Social History   Socioeconomic History  . Marital status: Married    Spouse name: Not on file  .  Number of children: 4  . Years of education: Not on file  . Highest education level: Not on file  Occupational History    Comment: Marketing executive at [employer]  . Financial resource strain: Not hard at all  . Food insecurity    Worry: Never true    Inability: Never true  . Transportation needs    Medical: No    Non-medical: No  Tobacco Use  . Smoking status: Former Smoker    Packs/day: 0.25    Years: 10.00    Pack years: 2.50    Types: Cigarettes    Quit date: 10/31/1991    Years since quitting: 27.2  . Smokeless tobacco: Never Used  Substance and Sexual Activity  . Alcohol use: No  . Drug use: No  . Sexual activity: Not Currently  Lifestyle  . Physical  activity    Days per week: 0 days    Minutes per session: 0 min  . Stress: To some extent  Relationships  . Social connections    Talks on phone: More than three times a week    Gets together: Twice a week    Attends religious service: More than 4 times per year    Active member of club or organization: Yes    Attends meetings of clubs or organizations: More than 4 times per year    Relationship status: Married  . Intimate partner violence    Fear of current or ex partner: No    Emotionally abused: No    Physically abused: No    Forced sexual activity: No  Other Topics Concern  . Not on file  Social History Narrative   Lives with husband and 80 year old son   Husband is Interior and spatial designer of [employer]    FAMILY HISTORY:  Family History  Problem Relation Age of Onset  . Hypertension Mother   . Hypertension Father   . Prostate cancer Father   . Cancer Maternal Aunt        spine/back  . Cancer Paternal Grandfather        lung  . Healthy Son   . Healthy Son   . Healthy Son   . Healthy Son   . Colon cancer Neg Hx     CURRENT MEDICATIONS:  Outpatient Encounter Medications as of 01/10/2019  Medication Sig  . ALPRAZolam (XANAX) 0.5 MG tablet Patient may take a tablet during the day may use up to 1-1/2 tablets at night to help with sleep  . potassium chloride SA (K-DUR) 20 MEQ tablet Take 1 tablet (20 mEq total) by mouth daily. (Patient taking differently: Take 20 mEq by mouth daily with lunch. )  . thyroid (ARMOUR) 90 MG tablet Take 90 mg by mouth daily before breakfast.   . vitamin B-12 (CYANOCOBALAMIN) 1000 MCG tablet Take 1,000 mcg by mouth daily.  Marland Kitchen lidocaine-prilocaine (EMLA) cream Apply small amount to port site one hour prior to appointment and cover with plastic wrap. (Patient not taking: Reported on 01/10/2019)  . loperamide (IMODIUM) 2 MG capsule Take 1 capsule (2 mg total) by mouth 3 (three) times daily. (Patient not taking: Reported on 01/10/2019)  . ondansetron  (ZOFRAN) 4 MG tablet Take 1 tablet (4 mg total) by mouth every 8 (eight) hours as needed for nausea or vomiting. (Patient not taking: Reported on 01/10/2019)  . polycarbophil (FIBERCON) 625 MG tablet Take 1 tablet (625 mg total) by mouth 2 (two) times daily. (Patient not taking: Reported  on 01/10/2019)  . prochlorperazine (COMPAZINE) 10 MG tablet Take 10 mg by mouth every 6 (six) hours as needed for nausea or vomiting.  . traZODone (DESYREL) 50 MG tablet Take 1 tablet (50 mg total) by mouth at bedtime as needed for sleep.  . [DISCONTINUED] levofloxacin (LEVAQUIN) 500 MG tablet Take 1 tablet (500 mg total) by mouth daily.   Facility-Administered Encounter Medications as of 01/10/2019  Medication  . clindamycin (CLEOCIN) 900 mg in dextrose 5 % 50 mL IVPB   And  . gentamicin (GARAMYCIN) 350 mg in dextrose 5 % 50 mL IVPB  . sodium chloride 0.9 % 1,000 mL with potassium chloride 20 mEq, magnesium sulfate 2 g infusion    ALLERGIES:  I have reviewed her allergies.  PHYSICAL EXAM:  ECOG Performance status: 0  Vitals:   01/10/19 1134  BP: 94/68  Pulse: (!) 110  Resp: 20  Temp: (!) 97.5 F (36.4 C)  SpO2: 100%   Filed Weights   01/10/19 1134  Weight: 139 lb 4.8 oz (63.2 kg)    Physical Exam Vitals signs reviewed.  Constitutional:      Appearance: Normal appearance.  Cardiovascular:     Rate and Rhythm: Normal rate and regular rhythm.     Heart sounds: Normal heart sounds.  Pulmonary:     Effort: Pulmonary effort is normal.     Breath sounds: Normal breath sounds.  Abdominal:     General: There is no distension.     Palpations: Abdomen is soft. There is no mass.  Musculoskeletal:        General: No swelling.  Skin:    General: Skin is warm.  Neurological:     General: No focal deficit present.     Mental Status: She is alert and oriented to person, place, and time.  Psychiatric:        Mood and Affect: Mood normal.     Right upper quadrant ileostomy present.   LABORATORY DATA:  I have reviewed the labs as listed.  CBC    Component Value Date/Time   WBC 3.3 (L) 01/08/2019 1115   RBC 3.13 (L) 01/08/2019 1115   HGB 10.2 (L) 01/08/2019 1115   HGB 13.5 11/03/2015 0841   HCT 30.9 (L) 01/08/2019 1115   HCT 39.7 11/03/2015 0841   PLT 141 (L) 01/08/2019 1115   PLT 165 11/03/2015 0841   MCV 98.7 01/08/2019 1115   MCV 95 11/03/2015 0841   MCH 32.6 01/08/2019 1115   MCHC 33.0 01/08/2019 1115   RDW 15.5 01/08/2019 1115   RDW 13.2 11/03/2015 0841   LYMPHSABS 0.3 (L) 01/08/2019 1115   LYMPHSABS 1.4 11/03/2015 0841   MONOABS 0.3 01/08/2019 1115   EOSABS 0.0 01/08/2019 1115   EOSABS 0.1 11/03/2015 0841   BASOSABS 0.0 01/08/2019 1115   BASOSABS 0.0 11/03/2015 0841   CMP Latest Ref Rng & Units 01/08/2019 12/31/2018 12/20/2018  Glucose 70 - 99 mg/dL 97 112(H) 99  BUN 6 - 20 mg/dL _0 Creatinine 0.44 - 1.00 mg/dL 0.90 0.77 0.83  Sodium 135 - 145 mmol/L 136 138 136  Potassium 3.5 - 5.1 mmol/L 3.9 3.4(L) 3.3(L)  Chloride 98 - 111 mmol/L 102 103 104  CO2 22 - 32 mmol/L _1 Calcium 8.9 - 10.3 mg/dL 9.2 9.3 8.0(L)  Total Protein 6.5 - 8.1 g/dL 7.3 7.6 -  Total Bilirubin 0.3 - 1.2 mg/dL 2.7(H) 1.4(H) -  Alkaline Phos 38 - 126 U/L 170(H) 249(H) -  AST 15 - 41 U/L 30 28 -  ALT 0 - 44 U/L 16 15 -       DIAGNOSTIC IMAGING:  I have independently reviewed the scans and discussed with the patient.   I have reviewed Venita Lick LPN's note and agree with the documentation.  I personally performed a face-to-face visit, made revisions and my assessment and plan is as follows.    ASSESSMENT & PLAN:   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[facility]) 1.  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 - Colonoscopy on 02/19/2018 shows rectal  mass, 2 to 4 cm from the anal verge, normal sigmoid colon.  Biopsies consistent with adenocarcinoma. - MRI of the pelvis shows rectal adenocarcinoma, T stage-T3, N stage- N1-equivocal N2 nodes.  Maximum extension beyond muscularis propria is 6 mm (advanced T3).  Circumferential resection margin is 7 mm.  Right obturator node 6 mm.  Right external iliac node at 8 mm.  Craniocaudal extent of the tumor is 4.4 cm.  Approximately 180 degree circumferential.  Shortest distance from the distal tumor to anal sphincter is 2.1 cm.  Location from anal verge, 7.2 cm to the middle of the tumor. - CT CAP on 02/21/2018 shows 9 x 7 mm left lower lobe pulmonary nodule, new since 2009.  Possible subpleural left lower lobe 3 mm nodule versus area of pleural thickening.  Vague area of hypoattenuation in the posterior right hepatic lobe measuring 1.6 cm.  Differential includes benign/malignant lesion with altered perfusion.  Focal steatosis is possible. - MRI of the liver on 03/08/2018 shows 1.4 cm lesion in the posterior right hepatic lobe.  There is a 5 mm focus of delayed hyperenhancement in the lateral segment of the left liver, questionable for a second metastatic deposit. -PET CT scan on 03/05/2018 shows hypermetabolic rectal mass.  Left perirectal hypermetabolic and sigmoid mesocolon lymph nodes.  Single hepatic metastatic lesion in the right hepatic lobe.  7 mm pulmonary nodule at the left lung base is suspicious for meta stasis. -Liver biopsy on 03/09/2018 consistent with adenocarcinoma.  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  Low starting dose of Irinotecan is indicated. - 7 cycles of FOLFOX with vectibix completed on 06/27/2018.  Vectibix was left out during cycle 7.   - PET scan on 06/21/2018 showed interval resolution of the hypermetabolic area associated with right lobe of the liver metastasis.  Spleen has slightly enlarged to 13.4 cm in  length and 12.9 cm previously.  Interval decrease in the radiotracer uptake associated with rectal neoplasm with SUV of 5 compared to 17.5 previously.  Previously noted FDG avid sigmoid mesocolon lymph nodes have resolved. - MRI of the liver on 06/25/2018 shows decrease in size of the liver metastasis to 5 mm, from 15 mm previously.  No new lesions noted. - XRT with Xeloda from 07/30/2018-09/06/2018.  Xeloda stopped on 09/05/2018 secondary to elevated bilirubin.  -CT CAP on 09/27/2018 shows stable posttreatment findings of rectal neoplasm, metastatic liver lesion and left lung nodule compared to PET CT scan from 06/21/2018.  No new metastatic disease. -Right liver lesion microwave ablation on 10/17/2018. -Low anterior resection with diverting ileostomy on 5/99/3570, complicated by ileostomy dysfunction requiring surgery POD 4. -Pathology shows YPT2Y PN 0,  0/6 lymph nodes positive, margins negative. She reports 500-700 mL of output per day.  She is drinking about 16 ounces of body armor, 16 ounces of Crystal light and 1 big cup of iced tea.  She is also urinating frequently. -Her blood pressure is 94/68 and pulse rate is 110.  Respirate is 20.  She denies any pain but has occasional twinges at the ostomy site. -I will check her TSH and free T4.  We will also check her cortisol level.  I will send a CEA level. -She is scheduled to have CT scan of the COPA next Thursday.  I will see her back in 2 weeks to discuss results.  2.  Difficulty sleeping: -She is taking 1.5 mg of Xanax at bedtime.  But she wakes up in the middle of the night.  She does not want to try Ambien. -I will give her trazodone 50 mg at bedtime as needed.  She was told not to take Xanax at bedtime with it. -I will be very low threshold to add an antidepressant next.  3.  Weight loss: -She had lost some weight since surgery.  She does not have good appetite.  This is slowly improving over time.  4.  Nausea: She reports nausea and severe  weakness every evening.  Compazine is helping with the nausea.  Zofran did not help.       Total time spent is 40 minutes with more than 50% of the time spent face-to-face discussing path reports, further plan, counseling and coordination of care.  Orders placed this encounter:  Orders Placed This Encounter  Procedures  . TSH  . T4, free  . Cortisol  . CEA      Derek Jack, MD [place] [phone]

## 2019-01-11 LAB — CEA: CEA: 0.8 ng/mL (ref 0.0–4.7)

## 2019-01-14 ENCOUNTER — Other Ambulatory Visit (HOSPITAL_COMMUNITY): Payer: Self-pay | Admitting: *Deleted

## 2019-01-14 ENCOUNTER — Encounter (HOSPITAL_COMMUNITY)
Admission: RE | Admit: 2019-01-14 | Discharge: 2019-01-14 | Disposition: A | Discharge status: Home / Self Care | Payer: 59 | Source: Ambulatory Visit | Attending: Hematology | Admitting: Hematology

## 2019-01-14 ENCOUNTER — Other Ambulatory Visit: Payer: Self-pay

## 2019-01-14 ENCOUNTER — Telehealth: Payer: Self-pay | Admitting: *Deleted

## 2019-01-14 ENCOUNTER — Other Ambulatory Visit: Payer: Self-pay | Admitting: Family Medicine

## 2019-01-14 ENCOUNTER — Encounter: Payer: Self-pay | Admitting: Family Medicine

## 2019-01-14 DIAGNOSIS — R531 Weakness: Secondary | ICD-10-CM | POA: Diagnosis not present

## 2019-01-14 DIAGNOSIS — R519 Headache, unspecified: Secondary | ICD-10-CM | POA: Diagnosis not present

## 2019-01-14 DIAGNOSIS — G479 Sleep disorder, unspecified: Secondary | ICD-10-CM | POA: Diagnosis not present

## 2019-01-14 DIAGNOSIS — F329 Major depressive disorder, single episode, unspecified: Secondary | ICD-10-CM | POA: Diagnosis not present

## 2019-01-14 DIAGNOSIS — E039 Hypothyroidism, unspecified: Secondary | ICD-10-CM | POA: Diagnosis not present

## 2019-01-14 DIAGNOSIS — R5383 Other fatigue: Secondary | ICD-10-CM | POA: Diagnosis not present

## 2019-01-14 DIAGNOSIS — H524 Presbyopia: Secondary | ICD-10-CM | POA: Diagnosis not present

## 2019-01-14 DIAGNOSIS — C20 Malignant neoplasm of rectum: Secondary | ICD-10-CM | POA: Diagnosis not present

## 2019-01-14 DIAGNOSIS — H5213 Myopia, bilateral: Secondary | ICD-10-CM | POA: Diagnosis not present

## 2019-01-14 DIAGNOSIS — R110 Nausea: Secondary | ICD-10-CM | POA: Diagnosis not present

## 2019-01-14 DIAGNOSIS — C787 Secondary malignant neoplasm of liver and intrahepatic bile duct: Secondary | ICD-10-CM | POA: Diagnosis not present

## 2019-01-14 MED ORDER — SODIUM CHLORIDE 0.9 % IV SOLN
Freq: Once | INTRAVENOUS | Status: AC
  Administered 2019-01-14: 12:00:00 via INTRAVENOUS

## 2019-01-14 MED ORDER — ALPRAZOLAM 0.5 MG PO TABS: ORAL_TABLET | ORAL | 0 refills | Status: DC

## 2019-01-14 MED ORDER — HEPARIN SOD (PORK) LOCK FLUSH 100 UNIT/ML IV SOLN
500.0000 [IU] | Freq: Once | INTRAVENOUS | Status: AC
  Administered 2019-01-14: 500 [IU] via INTRAVENOUS
  Filled 2019-01-14: qty 5

## 2019-01-14 MED FILL — ALPRAZolam 0.5 MG TABS: 0.5 | 30 days supply | Qty: 75 | Fill #0

## 2019-01-14 NOTE — Telephone Encounter (Signed)
[facility] calling to clarify script sent in today for xanax. It had a note to pharm to fill 11/30/18 and after this prescription will revert back to one bid prn. But it was sent in for one during the day and one and a half at night.

## 2019-01-14 NOTE — Telephone Encounter (Signed)
Please clarify with the patient Call her and find out if she wants to do 1 twice daily or 1 in the morning and 1-1/2 at night  You can let her know that we were under the impression she would eventually go back to doing 1 in the morning 1 in the evening

## 2019-01-14 NOTE — Telephone Encounter (Signed)
Contacted patient. Pt states that she is taking one in the morning and 1-1/2 at night. Pt would like to keep it this way for a little while longer if possible. Please advise. Thank you

## 2019-01-14 NOTE — Telephone Encounter (Signed)
[place] and spoke with [employer]. Informed Samantha Reynolds that we will stick with one in the morning and 1-1/2 at night. Megan verbalized understanding.

## 2019-01-14 NOTE — Telephone Encounter (Signed)
Nurses Please tell: Pharmacy for now we will stick with 1 in the morning 1-1/2 in the evening thank you  Please let them know we did confirm with the patient

## 2019-01-15 ENCOUNTER — Encounter (HOSPITAL_COMMUNITY): Payer: Self-pay | Admitting: *Deleted

## 2019-01-15 NOTE — Progress Notes (Signed)
Patient called clinic to report the trazodone did not work well for her. It made her sleep but made her have a really bad "hang over" the next day. She said she felt "drunk as a skunk".  Per Dr. Delton Coombes, patient is to try half of the trazodone by mouth at night.  If this doesn't help her sleep and feel rested then she can continue taking her Xanax.   I notified patient and she verbalizes understanding. She will let Dr. Delton Coombes know how it is helping at her next visit.

## 2019-01-17 ENCOUNTER — Other Ambulatory Visit: Payer: Self-pay

## 2019-01-17 ENCOUNTER — Ambulatory Visit (HOSPITAL_COMMUNITY)
Admission: RE | Admit: 2019-01-17 | Discharge: 2019-01-17 | Disposition: A | Discharge status: Home / Self Care | Payer: 59 | Source: Ambulatory Visit | Attending: Hematology | Admitting: Hematology

## 2019-01-17 DIAGNOSIS — R918 Other nonspecific abnormal finding of lung field: Secondary | ICD-10-CM | POA: Diagnosis not present

## 2019-01-17 DIAGNOSIS — C19 Malignant neoplasm of rectosigmoid junction: Secondary | ICD-10-CM | POA: Diagnosis not present

## 2019-01-17 DIAGNOSIS — C20 Malignant neoplasm of rectum: Secondary | ICD-10-CM | POA: Insufficient documentation

## 2019-01-17 MED ORDER — IOHEXOL 300 MG/ML  SOLN
100.0000 mL | Freq: Once | INTRAMUSCULAR | Status: AC | PRN
  Administered 2019-01-17: 100 mL via INTRAVENOUS

## 2019-01-17 MED ORDER — IOHEXOL 300 MG/ML  SOLN
30.0000 mL | Freq: Once | INTRAMUSCULAR | Status: AC | PRN
  Administered 2019-01-17: 12:00:00 30 mL via ORAL

## 2019-01-18 DIAGNOSIS — Z932 Ileostomy status: Secondary | ICD-10-CM | POA: Diagnosis not present

## 2019-01-22 ENCOUNTER — Encounter (HOSPITAL_COMMUNITY): Payer: Self-pay

## 2019-01-22 ENCOUNTER — Other Ambulatory Visit: Payer: Self-pay

## 2019-01-22 ENCOUNTER — Inpatient Hospital Stay (HOSPITAL_COMMUNITY): Payer: 59

## 2019-01-22 ENCOUNTER — Encounter (HOSPITAL_COMMUNITY)
Admission: RE | Admit: 2019-01-22 | Discharge: 2019-01-22 | Disposition: A | Discharge status: Home / Self Care | Payer: 59 | Source: Ambulatory Visit | Attending: Hematology | Admitting: Hematology

## 2019-01-22 DIAGNOSIS — Z01812 Encounter for preprocedural laboratory examination: Secondary | ICD-10-CM | POA: Diagnosis not present

## 2019-01-22 DIAGNOSIS — C20 Malignant neoplasm of rectum: Secondary | ICD-10-CM | POA: Insufficient documentation

## 2019-01-22 LAB — CBC WITH DIFFERENTIAL/PLATELET
Abs Immature Granulocytes: 0.01 10*3/uL (ref 0.00–0.07)
Basophils Absolute: 0 10*3/uL (ref 0.0–0.1)
Basophils Relative: 0 %
Eosinophils Absolute: 0 10*3/uL (ref 0.0–0.5)
Eosinophils Relative: 1 %
HCT: 31 % — ABNORMAL LOW (ref 36.0–46.0)
Hemoglobin: 10.5 g/dL — ABNORMAL LOW (ref 12.0–15.0)
Immature Granulocytes: 0 %
Lymphocytes Relative: 17 %
Lymphs Abs: 0.6 10*3/uL — ABNORMAL LOW (ref 0.7–4.0)
MCH: 33.2 pg (ref 26.0–34.0)
MCHC: 33.9 g/dL (ref 30.0–36.0)
MCV: 98.1 fL (ref 80.0–100.0)
Monocytes Absolute: 0.2 10*3/uL (ref 0.1–1.0)
Monocytes Relative: 7 %
Neutro Abs: 2.6 10*3/uL (ref 1.7–7.7)
Neutrophils Relative %: 75 %
Platelets: 150 10*3/uL (ref 150–400)
RBC: 3.16 MIL/uL — ABNORMAL LOW (ref 3.87–5.11)
RDW: 14.7 % (ref 11.5–15.5)
WBC: 3.5 10*3/uL — ABNORMAL LOW (ref 4.0–10.5)
nRBC: 0 % (ref 0.0–0.2)

## 2019-01-22 LAB — COMPREHENSIVE METABOLIC PANEL
ALT: 18 U/L (ref 0–44)
AST: 29 U/L (ref 15–41)
Albumin: 4.2 g/dL (ref 3.5–5.0)
Alkaline Phosphatase: 121 U/L (ref 38–126)
Anion gap: 9 (ref 5–15)
BUN: 10 mg/dL (ref 6–20)
CO2: 24 mmol/L (ref 22–32)
Calcium: 8.8 mg/dL — ABNORMAL LOW (ref 8.9–10.3)
Chloride: 107 mmol/L (ref 98–111)
Creatinine, Ser: 0.93 mg/dL (ref 0.44–1.00)
GFR calc Af Amer: >60 mL/min (ref >60)
GFR calc non Af Amer: >60 mL/min (ref >60)
Glucose, Bld: 100 mg/dL — ABNORMAL HIGH (ref 70–99)
Potassium: 3.6 mmol/L (ref 3.5–5.1)
Sodium: 140 mmol/L (ref 135–145)
Total Bilirubin: 3.4 mg/dL — ABNORMAL HIGH (ref 0.3–1.2)
Total Protein: 6.9 g/dL (ref 6.5–8.1)

## 2019-01-22 LAB — MAGNESIUM: Magnesium: 2.2 mg/dL (ref 1.7–2.4)

## 2019-01-22 MED ORDER — SODIUM CHLORIDE 0.9 % IV SOLN
Freq: Once | INTRAVENOUS | Status: AC
  Administered 2019-01-22: 13:00:00 via INTRAVENOUS

## 2019-01-22 MED ORDER — SODIUM CHLORIDE 0.9 % IV SOLN: Freq: Once | INTRAVENOUS | Status: DC

## 2019-01-22 MED ORDER — HEPARIN SOD (PORK) LOCK FLUSH 100 UNIT/ML IV SOLN
500.0000 [IU] | Freq: Once | INTRAVENOUS | Status: AC
  Administered 2019-01-22: 500 [IU] via INTRAVENOUS

## 2019-01-23 ENCOUNTER — Other Ambulatory Visit: Payer: Self-pay

## 2019-01-24 ENCOUNTER — Encounter (HOSPITAL_COMMUNITY): Payer: Self-pay | Admitting: Hematology

## 2019-01-24 ENCOUNTER — Inpatient Hospital Stay (HOSPITAL_BASED_OUTPATIENT_CLINIC_OR_DEPARTMENT_OTHER): Payer: 59 | Admitting: Hematology

## 2019-01-24 ENCOUNTER — Ambulatory Visit (HOSPITAL_COMMUNITY): Payer: 59 | Admitting: Hematology

## 2019-01-24 VITALS — BP 96/63 | HR 87 | Temp 97.5°F | Resp 18 | Wt 140.5 lb

## 2019-01-24 DIAGNOSIS — G479 Sleep disorder, unspecified: Secondary | ICD-10-CM | POA: Diagnosis not present

## 2019-01-24 DIAGNOSIS — Z85850 Personal history of malignant neoplasm of thyroid: Secondary | ICD-10-CM | POA: Diagnosis not present

## 2019-01-24 DIAGNOSIS — C20 Malignant neoplasm of rectum: Secondary | ICD-10-CM | POA: Diagnosis not present

## 2019-01-24 DIAGNOSIS — R110 Nausea: Secondary | ICD-10-CM | POA: Diagnosis not present

## 2019-01-24 DIAGNOSIS — Z801 Family history of malignant neoplasm of trachea, bronchus and lung: Secondary | ICD-10-CM | POA: Diagnosis not present

## 2019-01-24 DIAGNOSIS — R519 Headache, unspecified: Secondary | ICD-10-CM | POA: Diagnosis not present

## 2019-01-24 DIAGNOSIS — Z808 Family history of malignant neoplasm of other organs or systems: Secondary | ICD-10-CM | POA: Diagnosis not present

## 2019-01-24 DIAGNOSIS — Z87891 Personal history of nicotine dependence: Secondary | ICD-10-CM | POA: Diagnosis not present

## 2019-01-24 DIAGNOSIS — F329 Major depressive disorder, single episode, unspecified: Secondary | ICD-10-CM | POA: Diagnosis not present

## 2019-01-24 DIAGNOSIS — Z923 Personal history of irradiation: Secondary | ICD-10-CM | POA: Diagnosis not present

## 2019-01-24 DIAGNOSIS — Z8249 Family history of ischemic heart disease and other diseases of the circulatory system: Secondary | ICD-10-CM | POA: Diagnosis not present

## 2019-01-24 DIAGNOSIS — E063 Autoimmune thyroiditis: Secondary | ICD-10-CM

## 2019-01-24 DIAGNOSIS — Z9221 Personal history of antineoplastic chemotherapy: Secondary | ICD-10-CM | POA: Diagnosis not present

## 2019-01-24 DIAGNOSIS — Z79899 Other long term (current) drug therapy: Secondary | ICD-10-CM | POA: Diagnosis not present

## 2019-01-24 DIAGNOSIS — R531 Weakness: Secondary | ICD-10-CM | POA: Diagnosis not present

## 2019-01-24 DIAGNOSIS — E039 Hypothyroidism, unspecified: Secondary | ICD-10-CM | POA: Diagnosis not present

## 2019-01-24 DIAGNOSIS — E038 Other specified hypothyroidism: Secondary | ICD-10-CM

## 2019-01-24 DIAGNOSIS — Z8042 Family history of malignant neoplasm of prostate: Secondary | ICD-10-CM | POA: Diagnosis not present

## 2019-01-24 DIAGNOSIS — R5383 Other fatigue: Secondary | ICD-10-CM | POA: Diagnosis not present

## 2019-01-24 DIAGNOSIS — C787 Secondary malignant neoplasm of liver and intrahepatic bile duct: Secondary | ICD-10-CM | POA: Diagnosis not present

## 2019-01-24 MED ORDER — THYROID 15 MG PO TABS: 15.0000 mg | ORAL_TABLET | Freq: Every day | ORAL | 1 refills | Status: DC

## 2019-01-24 MED ORDER — THYROID 30 MG PO TABS: 30.0000 mg | ORAL_TABLET | Freq: Every day | ORAL | 1 refills | Status: DC

## 2019-01-24 MED FILL — ARMOUR THYROID 15 MG TABLET: 15 | 30 days supply | Qty: 30 | Fill #0

## 2019-01-24 MED FILL — ARMOUR THYROID 30 MG TABLET: 30 | 30 days supply | Qty: 30 | Fill #0

## 2019-01-24 NOTE — Progress Notes (Signed)
[place], [place] [address]   CLINIC:  Medical Oncology/Hematology  PCP:  [name], MD [address] [place] [address] [phone]   REASON FOR VISIT:  Follow-up for rectal cancer   BRIEF ONCOLOGIC HISTORY:  Oncology History  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[facility])  02/26/2018 Initial Diagnosis   Rectal cancer ([facility])   03/13/2018 Cancer Staging   Staging form: Colon and Rectum, AJCC 8th Edition - Clinical stage from 03/13/2018: Stage IVA (cT3, cN1b, cM1a) - Signed by Derek Jack, MD on 03/13/2018   03/14/2018 - 07/10/2018 Chemotherapy   The patient had palonosetron (ALOXI) injection 0.25 mg, 0.25 mg, Intravenous,  Once, 7 of 8 cycles Administration: 0.25 mg (03/14/2018), 0.25 mg (03/27/2018), 0.25 mg (04/18/2018), 0.25 mg (05/02/2018), 0.25 mg (05/16/2018), 0.25 mg (06/05/2018), 0.25 mg (06/27/2018) leucovorin 800 mg in dextrose 5 % 250 mL infusion, 772 mg, Intravenous,  Once, 7 of 8 cycles Administration: 800 mg (03/14/2018), 800 mg (03/27/2018), 800 mg (05/02/2018), 800 mg (05/16/2018), 700 mg (04/18/2018), 800 mg (06/05/2018), 800 mg (06/27/2018) oxaliplatin (ELOXATIN) 165 mg in dextrose 5 % 500 mL chemo infusion, 85 mg/m2 = 165 mg, Intravenous,  Once, 6 of 7 cycles Dose modification: 68 mg/m2 (80 % of original dose 85 mg/m2, Cycle 4, Reason: Provider Judgment) Administration: 165 mg (03/14/2018), 165 mg (03/27/2018), 130 mg (05/02/2018), 130 mg (05/16/2018), 130 mg (06/05/2018), 130 mg (06/27/2018) panitumumab (VECTIBIX) 500 mg in sodium chloride 0.9 % 100 mL chemo infusion, 480 mg, Intravenous,  Once, 4 of 5 cycles Administration: 500 mg (04/18/2018), 480 mg (05/02/2018), 480 mg (05/16/2018), 480 mg (06/05/2018) fluorouracil (ADRUCIL) chemo injection 750 mg, 400 mg/m2 = 750 mg (100 % of original dose 400 mg/m2), Intravenous,  Once, 6 of 7 cycles Dose modification: 400 mg/m2 (original dose 400 mg/m2, Cycle 1), 400 mg/m2 (original dose 400  mg/m2, Cycle 3) Administration: 750 mg (03/14/2018), 750 mg (04/18/2018), 750 mg (05/02/2018), 750 mg (05/16/2018), 750 mg (06/05/2018), 750 mg (06/27/2018) fosaprepitant (EMEND) 150 mg, dexamethasone (DECADRON) 12 mg in sodium chloride 0.9 % 145 mL IVPB, , Intravenous,  Once, 4 of 5 cycles Administration:  (05/02/2018),  (05/16/2018),  (06/05/2018),  (06/27/2018) fluorouracil (ADRUCIL) 4,650 mg in sodium chloride 0.9 % 57 mL chemo infusion, 2,400 mg/m2 = 4,650 mg, Intravenous, 1 Day/Dose, 7 of 8 cycles Administration: 4,650 mg (03/14/2018), 4,650 mg (03/27/2018), 4,650 mg (04/18/2018), 4,650 mg (05/02/2018), 4,650 mg (05/16/2018), 4,650 mg (06/05/2018), 4,650 mg (06/27/2018)  for chemotherapy treatment.       CANCER STAGING: Cancer Staging Malignant neoplasm of rectum [facility]) Staging form: Colon and Rectum, AJCC 8th Edition - Clinical stage from 03/13/2018: Stage IVA (cT3, cN1b, cM1a) - Signed by Derek Jack, MD on 03/13/2018    INTERVAL HISTORY:  Ms. Macquarrie 53 y.o. female seen for follow-up of rectal cancer.  She is accompanied by her husband.  She had surgery on 12/07/2018.  Slowly her energy levels are improving.  She had recent CT scan of the abdomen and pelvis done.  Appetite and energy levels are 50%.  She is planning to go back to work soon.  Occasional nausea is controlled with Compazine.  She is taking Xanax for sleep.  She could not tolerate trazodone.  REVIEW OF SYSTEMS:  Review of Systems  Gastrointestinal: Positive for nausea.  Psychiatric/Behavioral: Positive for depression and sleep disturbance.  All other systems reviewed and are negative.    PAST MEDICAL/SURGICAL HISTORY:  Past Medical History:  Diagnosis Date  .  Anxiety   . Cancer [facility]) 2013   thyroid  . Endometrial polyp   . Endometrial thickening on ultra sound   . GERD (gastroesophageal reflux disease)   . 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 H/O Hashimoto thyroiditis   . History of  chemotherapy   . History of radiation therapy   . 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 Hyperlipidemia 11/09/2015  . Hypothyroidism   . 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[facility]) 02/26/2018   Past Surgical History:  Procedure Laterality Date  . BIOPSY  02/19/2018   Procedure: BIOPSY;  Surgeon: Rogene Houston, MD;  [facility];  Service: Endoscopy;;  rectum  . COLONOSCOPY N/A 08/20/2015   Procedure: COLONOSCOPY;  Surgeon: Rogene Houston, MD;  [facility];  Service: Endoscopy;  Laterality: N/A;  730  . [place] ENDOMETRIAL ABLATION  08-13-2004  . DIVERTING ILEOSTOMY N/A 12/07/2018   Procedure: DIVERTING LOOP ILEOSTOMY;  Surgeon: Leighton Ruff, MD;  [facility]: [facility];  Service: General;  Laterality: N/A;  . FLEXIBLE SIGMOIDOSCOPY N/A 02/19/2018   Procedure: FLEXIBLE SIGMOIDOSCOPY;  Surgeon: Rogene Houston, MD;  [facility];  Service: Endoscopy;  Laterality: N/A;  . HYSTEROSCOPY W/D&C N/A 04/30/2015   Procedure: DILATATION AND CURETTAGE / INTENDED HYSTEROSCOPY;  Surgeon: Dian Queen, MD;  Location: [facility];  Service: Gynecology;  Laterality: N/A;  . IR US GUIDE BX ASP/DRAIN  03/09/2018  . LAPAROSCOPIC CHOLECYSTECTOMY  04/1996  . LAPAROSCOPY N/A 12/11/2018   Procedure: LAPAROSCOPY  ILEOSTOMY REVISION AND ABDOMINAL WASHOUT;  Surgeon: Leighton Ruff, MD;  [facility];  Service: General;  Laterality: N/A;  . Liver Microwave   10/17/2018  . POLYPECTOMY  08/20/2015   Procedure: POLYPECTOMY;  Surgeon: Rogene Houston, MD;  [facility];  Service: Endoscopy;;  Splenic flexure polypectomy  . POLYPECTOMY  02/19/2018   Procedure: POLYPECTOMY;  Surgeon: Rogene Houston, MD;  [facility];  Service: Endoscopy;;  rectum  . PORTACATH PLACEMENT N/A 03/14/2018   Procedure: INSERTION PORT-A-CATH;  Surgeon:  Aviva Signs, MD;  Location: [facility];  Service: General;  Laterality: N/A;  . REDUCTION INCARCERATED UTERUS  06-20-2000   intrauterine preg. 13 wks /  urinary retention  . THYROID LOBECTOMY  11/24/2011   Procedure: THYROID LOBECTOMY;  Surgeon: Earnstine Regal, MD;  [facility];  Service: General;  Laterality: Left;  Left Thyroid Lobectomy  . TUBAL LIGATION  2002  . XI ROBOTIC ASSISTED LOWER ANTERIOR RESECTION N/A 12/07/2018   Procedure: XI ROBOTIC ASSISTED LOWER ANTERIOR RESECTION, SPENIC FLEXURE IMMOBILIZATION, COLOANAL ANASTOMOSIS, RIGID PROCTOSCOPY;  Surgeon: Leighton Ruff, MD;  [facility];  Service: General;  Laterality: N/A;     SOCIAL HISTORY:  Social History   Socioeconomic History  . Marital status: Married    Spouse name: Not on file  . Number of children: 4  . Years of education: Not on file  . Highest education level: Not on file  Occupational History    Comment: Marketing executive at [employer]  . Financial resource strain: Not hard at all  . Food insecurity    Worry: Never true    Inability: Never true  . Transportation needs    Medical: No    Non-medical: No  Tobacco Use  . Smoking status:  Former Smoker    Packs/day: 0.25    Years: 10.00    Pack years: 2.50    Types: Cigarettes    Quit date: 10/31/1991    Years since quitting: 27.2  . Smokeless tobacco: Never Used  Substance and Sexual Activity  . Alcohol use: No  . Drug use: No  . Sexual activity: Not Currently  Lifestyle  . Physical activity    Days per week: 0 days    Minutes per session: 0 min  . Stress: To some extent  Relationships  . Social connections    Talks on phone: More than three times a week    Gets together: Twice a week    Attends religious service: More than 4 times per year    Active member of club or organization: Yes    Attends meetings of clubs or organizations: More than 4 times per year    Relationship status: Married  . Intimate partner violence    Fear of  current or ex partner: No    Emotionally abused: No    Physically abused: No    Forced sexual activity: No  Other Topics Concern  . Not on file  Social History Narrative   Lives with husband and 52 year old son   Husband is Interior and spatial designer of [employer]    FAMILY HISTORY:  Family History  Problem Relation Age of Onset  . Hypertension Mother   . Hypertension Father   . Prostate cancer Father   . Cancer Maternal Aunt        spine/back  . Cancer Paternal Grandfather        lung  . Healthy Son   . Healthy Son   . Healthy Son   . Healthy Son   . Colon cancer Neg Hx     CURRENT MEDICATIONS:  Outpatient Encounter Medications as of 01/24/2019  Medication Sig  . ALPRAZolam (XANAX) 0.5 MG tablet Patient may take a tablet during the day may use up to 1-1/2 tablets at night to help with sleep  . potassium chloride SA (K-DUR) 20 MEQ tablet Take 1 tablet (20 mEq total) by mouth daily. (Patient taking differently: Take 20 mEq by mouth daily with lunch. )  . vitamin B-12 (CYANOCOBALAMIN) 1000 MCG tablet Take 1,000 mcg by mouth daily.  . [DISCONTINUED] polycarbophil (FIBERCON) 625 MG tablet Take 1 tablet (625 mg total) by mouth 2 (two) times daily.  . [DISCONTINUED] thyroid (ARMOUR) 90 MG tablet Take 90 mg by mouth daily before breakfast.   . lidocaine-prilocaine (EMLA) cream Apply small amount to port site one hour prior to appointment and cover with plastic wrap. (Patient not taking: Reported on 01/10/2019)  . loperamide (IMODIUM) 2 MG capsule Take 1 capsule (2 mg total) by mouth 3 (three) times daily. (Patient not taking: Reported on 01/10/2019)  . ondansetron (ZOFRAN) 4 MG tablet Take 1 tablet (4 mg total) by mouth every 8 (eight) hours as needed for nausea or vomiting. (Patient not taking: Reported on 01/10/2019)  . prochlorperazine (COMPAZINE) 10 MG tablet Take 10 mg by mouth every 6 (six) hours as needed for nausea or vomiting.  . thyroid (ARMOUR THYROID) 15 MG tablet Take 1 tablet (15  mg total) by mouth daily. Take one 81m tablet along with one 30 mg tablet  . thyroid (ARMOUR THYROID) 30 MG tablet Take 1 tablet (30 mg total) by mouth daily before breakfast. Take one 366mtablet along with a 1558mablet daily  . [DISCONTINUED] traZODone (  DESYREL) 50 MG tablet Take 1 tablet (50 mg total) by mouth at bedtime as needed for sleep.   Facility-Administered Encounter Medications as of 01/24/2019  Medication  . clindamycin (CLEOCIN) 900 mg in dextrose 5 % 50 mL IVPB   And  . gentamicin (GARAMYCIN) 350 mg in dextrose 5 % 50 mL IVPB  . sodium chloride 0.9 % 1,000 mL with potassium chloride 20 mEq, magnesium sulfate 2 g infusion    ALLERGIES:  I have reviewed her allergies.  PHYSICAL EXAM:  ECOG Performance status: 0  Vitals:   01/24/19 0832  BP: 96/63  Pulse: 87  Resp: 18  Temp: (!) 97.5 F (36.4 C)  SpO2: 98%   Filed Weights   01/24/19 0832  Weight: 140 lb 8 oz (63.7 kg)    Physical Exam Vitals signs reviewed.  Constitutional:      Appearance: Normal appearance.  Cardiovascular:     Rate and Rhythm: Normal rate and regular rhythm.     Heart sounds: Normal heart sounds.  Pulmonary:     Effort: Pulmonary effort is normal.     Breath sounds: Normal breath sounds.  Abdominal:     General: There is no distension.     Palpations: Abdomen is soft. There is no mass.  Musculoskeletal:        General: No swelling.  Skin:    General: Skin is warm.  Neurological:     General: No focal deficit present.     Mental Status: She is alert and oriented to person, place, and time.  Psychiatric:        Mood and Affect: Mood normal.     Right upper quadrant ileostomy present.  LABORATORY DATA:  I have reviewed the labs as listed.  CBC    Component Value Date/Time   WBC 3.5 (L) 01/22/2019 1153   RBC 3.16 (L) 01/22/2019 1153   HGB 10.5 (L) 01/22/2019 1153   HGB 13.5 11/03/2015 0841   HCT 31.0 (L) 01/22/2019 1153   HCT 39.7 11/03/2015 0841   PLT 150 01/22/2019  1153   PLT 165 11/03/2015 0841   MCV 98.1 01/22/2019 1153   MCV 95 11/03/2015 0841   MCH 33.2 01/22/2019 1153   MCHC 33.9 01/22/2019 1153   RDW 14.7 01/22/2019 1153   RDW 13.2 11/03/2015 0841   LYMPHSABS 0.6 (L) 01/22/2019 1153   LYMPHSABS 1.4 11/03/2015 0841   MONOABS 0.2 01/22/2019 1153   EOSABS 0.0 01/22/2019 1153   EOSABS 0.1 11/03/2015 0841   BASOSABS 0.0 01/22/2019 1153   BASOSABS 0.0 11/03/2015 0841   CMP Latest Ref Rng & Units 01/22/2019 01/08/2019 12/31/2018  Glucose 70 - 99 mg/dL 100(H) 97 112(H)  BUN 6 - 20 mg/dL _0 Creatinine 0.44 - 1.00 mg/dL 0.93 0.90 0.77  Sodium 135 - 145 mmol/L 140 136 138  Potassium 3.5 - 5.1 mmol/L 3.6 3.9 3.4(L)  Chloride 98 - 111 mmol/L 107 102 103  CO2 22 - 32 mmol/L _1 Calcium 8.9 - 10.3 mg/dL 8.8(L) 9.2 9.3  Total Protein 6.5 - 8.1 g/dL 6.9 7.3 7.6  Total Bilirubin 0.3 - 1.2 mg/dL 3.4(H) 2.7(H) 1.4(H)  Alkaline Phos 38 - 126 U/L 121 170(H) 249(H)  AST 15 - 41 U/L _2 ALT 0 - 44 U/L _3 DIAGNOSTIC IMAGING:  I have independently reviewed the scans and discussed with the patient.   I have reviewed Venita Lick LPN's note  and agree with the documentation.  I personally performed a face-to-face visit, made revisions and my assessment and plan is as follows.    ASSESSMENT & PLAN:   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[facility]) 1.  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 - Colonoscopy on 02/19/2018 shows rectal mass, 2 to 4 cm from the anal verge, normal sigmoid colon.  Biopsies consistent with adenocarcinoma. - MRI of the pelvis shows rectal adenocarcinoma, T stage-T3, N stage- N1-equivocal N2 nodes.  Maximum extension beyond muscularis propria is 6 mm (advanced T3).  Circumferential resection  margin is 7 mm.  Right obturator node 6 mm.  Right external iliac node at 8 mm.  Craniocaudal extent of the tumor is 4.4 cm.  Approximately 180 degree circumferential.  Shortest distance from the distal tumor to anal sphincter is 2.1 cm.  Location from anal verge, 7.2 cm to the middle of the tumor. - CT CAP on 02/21/2018 shows 9 x 7 mm left lower lobe pulmonary nodule, new since 2009.  Possible subpleural left lower lobe 3 mm nodule versus area of pleural thickening.  Vague area of hypoattenuation in the posterior right hepatic lobe measuring 1.6 cm.  Differential includes benign/malignant lesion with altered perfusion.  Focal steatosis is possible. - MRI of the liver on 03/08/2018 shows 1.4 cm lesion in the posterior right hepatic lobe.  There is a 5 mm focus of delayed hyperenhancement in the lateral segment of the left liver, questionable for a second metastatic deposit. -PET CT scan on 03/05/2018 shows hypermetabolic rectal mass.  Left perirectal hypermetabolic and sigmoid mesocolon lymph nodes.  Single hepatic metastatic lesion in the right hepatic lobe.  7 mm pulmonary nodule at the left lung base is suspicious for meta stasis. -Liver biopsy on 03/09/2018 consistent with adenocarcinoma.  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  Low starting dose of Irinotecan is indicated. - 7 cycles of FOLFOX with vectibix completed on 06/27/2018.  Vectibix was left out during cycle 7.   - PET scan on 06/21/2018 showed interval resolution of the hypermetabolic area associated with right lobe of the liver metastasis.  Spleen has slightly enlarged to 13.4 cm in length and 12.9 cm previously.  Interval decrease in the radiotracer uptake associated with rectal neoplasm with SUV of 5 compared to 17.5 previously.  Previously noted FDG avid sigmoid mesocolon lymph nodes have resolved. - MRI of the liver on 06/25/2018 shows decrease in size of the liver  metastasis to 5 mm, from 15 mm previously.  No new lesions noted. - XRT with Xeloda from 07/30/2018-09/06/2018.  Xeloda stopped on 09/05/2018 secondary to elevated bilirubin.  -CT CAP on 09/27/2018 shows stable posttreatment findings of rectal neoplasm, metastatic liver lesion and left lung nodule compared to PET CT scan from 06/21/2018.  No new metastatic disease. -Right liver lesion microwave ablation on 10/17/2018. -Low anterior resection with diverting ileostomy on 5/63/1497, complicated by ileostomy dysfunction requiring surgery POD 4. -Pathology shows YPT2Y PN 0, 0/6 lymph nodes positive, margins negative.  We discussed path report in detail. -She reports 500-700 mL of output per day. -We reviewed results of CT CAP from 01/17/2019.  Postoperative findings of low anterior resection and anastomosis.  Unchanged appearance of nonenhancing hypodense lesions of  the posterior right lobe of the liver, hepatic segment 6.  No change in the irregular 5-6 mm nodule of the dependent left lung base.  Unchanged splenomegaly.  CEA was normal. -Her TSH was low at 0.136.  As she was having tachycardia, we have decreased the dose to half tablet.  Pulse rate improved 87. -We have sent a prescription for 45 mg of Armour Thyroid.  She will follow up with Dr. Francoise Schaumann (endocrinologist) soon. -I plan to see her back in 3 months with repeat CT scan and a CEA level.  2.  Difficulty sleeping: -She is taking 1.5 mg of Xanax at bedtime.  But she wakes up in the middle of the night.  She does not want to try Ambien. -I gave her trazodone 50 mg at bedtime.  She felt like she was drunk in the morning.  Hence she is taking Xanax as needed.  3.  Weight loss: -She had some weight loss and surgery.  Her appetite is improving at this time.  4.  Nausea: -She is taking Compazine as needed which is helping.       Total time spent is 25 minutes with more than 50% of time spent face-to-face discussing scan results, surveillance  plan, counseling and coordination of care.  Orders placed this encounter:  Orders Placed This Encounter  Procedures  . CT Chest W Contrast  . CT Abdomen Pelvis W Contrast  . CBC with Differential/Platelet  . Comprehensive metabolic panel  . TSH      Derek Jack, [place] [phone]

## 2019-01-24 NOTE — Patient Instructions (Addendum)
[place] at [facility] Discharge Instructions  You were seen today by Dr. Delton Coombes. He went over your recent lab and scan results. He will send in a new prescription for Armour thyroid. He would like you to have your labs done in 6 weeks. He will see you back in 3 months for scan and follow up.   Thank you for choosing [place] at [facility] to provide your oncology and hematology care.  To afford each patient quality time with our provider, please arrive at least 15 minutes before your scheduled appointment time.   If you have a lab appointment with the [facility] please come in thru the  Main Entrance and check in at the main information desk  You need to re-schedule your appointment should you arrive 10 or more minutes late.  We strive to give you quality time with our providers, and arriving late affects you and other patients whose appointments are after yours.  Also, if you no show three or more times for appointments you may be dismissed from the clinic at the providers discretion.     Again, thank you for choosing [facility].  Our hope is that these requests will decrease the amount of time that you wait before being seen by our physicians.       _____________________________________________________________  Should you have questions after your visit to [facility], please contact our office at [phone] between the hours of 8:00 a.m. and 4:30 p.m.  Voicemails left after 4:00 p.m. will not be returned until the following business day.  For prescription refill requests, have your pharmacy contact our office and allow 72 hours.    Cancer Center Support Programs:   > Cancer Support Group  2nd Tuesday of the month 1pm-2pm, Journey Room

## 2019-01-25 NOTE — Assessment & Plan Note (Signed)
1.  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 - Colonoscopy on 02/19/2018 shows rectal mass, 2 to 4 cm from the anal verge, normal sigmoid colon.  Biopsies consistent with adenocarcinoma. - MRI of the pelvis shows rectal adenocarcinoma, T stage-T3, N stage- N1-equivocal N2 nodes.  Maximum extension beyond muscularis propria is 6 mm (advanced T3).  Circumferential resection margin is 7 mm.  Right obturator node 6 mm.  Right external iliac node at 8 mm.  Craniocaudal extent of the tumor is 4.4 cm.  Approximately 180 degree circumferential.  Shortest distance from the distal tumor to anal sphincter is 2.1 cm.  Location from anal verge, 7.2 cm to the middle of the tumor. - CT CAP on 02/21/2018 shows 9 x 7 mm left lower lobe pulmonary nodule, new since 2009.  Possible subpleural left lower lobe 3 mm nodule versus area of pleural thickening.  Vague area of hypoattenuation in the posterior right hepatic lobe measuring 1.6 cm.  Differential includes benign/malignant lesion with altered perfusion.  Focal steatosis is possible. - MRI of the liver on 03/08/2018 shows 1.4 cm lesion in the posterior right hepatic lobe.  There is a 5 mm focus of delayed hyperenhancement in the lateral segment of the left liver, questionable for a second metastatic deposit. -PET CT scan on 03/05/2018 shows hypermetabolic rectal mass.  Left perirectal hypermetabolic and sigmoid mesocolon lymph nodes.  Single hepatic metastatic lesion in the right hepatic lobe.  7 mm pulmonary nodule at the left lung base is suspicious for meta stasis. -Liver biopsy on 03/09/2018 consistent with adenocarcinoma.  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  Low starting dose  of Irinotecan is indicated. - 7 cycles of FOLFOX with vectibix completed on 06/27/2018.  Vectibix was left out during cycle 7.   - PET scan on 06/21/2018 showed interval resolution of the hypermetabolic area associated with right lobe of the liver metastasis.  Spleen has slightly enlarged to 13.4 cm in length and 12.9 cm previously.  Interval decrease in the radiotracer uptake associated with rectal neoplasm with SUV of 5 compared to 17.5 previously.  Previously noted FDG avid sigmoid mesocolon lymph nodes have resolved. - MRI of the liver on 06/25/2018 shows decrease in size of the liver metastasis to 5 mm, from 15 mm previously.  No new lesions noted. - XRT with Xeloda from 07/30/2018-09/06/2018.  Xeloda stopped on 09/05/2018 secondary to elevated bilirubin.  -CT CAP on 09/27/2018 shows stable posttreatment findings of rectal neoplasm, metastatic liver lesion and left lung nodule compared to PET CT scan from 06/21/2018.  No new metastatic disease. -Right liver lesion microwave ablation on 10/17/2018. -Low anterior resection with diverting ileostomy on 09/25/1599, complicated by ileostomy dysfunction requiring surgery POD 4. -Pathology shows YPT2Y PN 0, 0/6 lymph nodes positive, margins negative.  We discussed path report in detail. -She reports 500-700 mL of output per day. -We reviewed results of CT CAP from 01/17/2019.  Postoperative findings of low anterior resection and anastomosis.  Unchanged appearance of nonenhancing hypodense lesions of the posterior right lobe of the liver, hepatic segment 6.  No change in the irregular 5-6 mm nodule of the dependent left lung base.  Unchanged splenomegaly.  CEA was normal. -Her TSH was low  at 0.136.  As she was having tachycardia, we have decreased the dose to half tablet.  Pulse rate improved 87. -We have sent a prescription for 45 mg of Armour Thyroid.  She will follow up with Dr. Francoise Schaumann (endocrinologist) soon. -I plan to see her back in 3 months with repeat CT scan  and a CEA level.  2.  Difficulty sleeping: -She is taking 1.5 mg of Xanax at bedtime.  But she wakes up in the middle of the night.  She does not want to try Ambien. -I gave her trazodone 50 mg at bedtime.  She felt like she was drunk in the morning.  Hence she is taking Xanax as needed.  3.  Weight loss: -She had some weight loss and surgery.  Her appetite is improving at this time.  4.  Nausea: -She is taking Compazine as needed which is helping.

## 2019-01-30 DIAGNOSIS — Z932 Ileostomy status: Secondary | ICD-10-CM | POA: Diagnosis not present

## 2019-01-31 ENCOUNTER — Encounter (HOSPITAL_COMMUNITY)
Admission: RE | Admit: 2019-01-31 | Discharge: 2019-01-31 | Disposition: A | Discharge status: Home / Self Care | Payer: 59 | Source: Ambulatory Visit | Attending: Hematology | Admitting: Hematology

## 2019-01-31 ENCOUNTER — Other Ambulatory Visit: Payer: Self-pay

## 2019-01-31 ENCOUNTER — Encounter (HOSPITAL_COMMUNITY): Payer: Self-pay

## 2019-01-31 DIAGNOSIS — C20 Malignant neoplasm of rectum: Secondary | ICD-10-CM | POA: Diagnosis not present

## 2019-01-31 MED ORDER — HEPARIN SOD (PORK) LOCK FLUSH 100 UNIT/ML IV SOLN
500.0000 [IU] | Freq: Once | INTRAVENOUS | Status: AC
  Administered 2019-01-31: 500 [IU] via INTRAVENOUS

## 2019-01-31 MED ORDER — SODIUM CHLORIDE 0.9 % IV SOLN
Freq: Once | INTRAVENOUS | Status: AC
  Administered 2019-01-31: 11:00:00 via INTRAVENOUS

## 2019-01-31 NOTE — Addendum Note (Signed)
Encounter addended by: Holley Dexter, RN on: 01/31/2019 1:15 PM  Actions taken: Charge Capture section accepted

## 2019-01-31 NOTE — Addendum Note (Signed)
Encounter addended by: Holley Dexter, RN on: 01/31/2019 1:17 PM  Actions taken: Charge Capture section accepted

## 2019-02-07 ENCOUNTER — Encounter (HOSPITAL_COMMUNITY)
Admission: RE | Admit: 2019-02-07 | Discharge: 2019-02-07 | Disposition: A | Discharge status: Home / Self Care | Payer: 59 | Source: Ambulatory Visit | Attending: Hematology | Admitting: Hematology

## 2019-02-07 ENCOUNTER — Other Ambulatory Visit: Payer: Self-pay

## 2019-02-07 ENCOUNTER — Encounter (HOSPITAL_COMMUNITY): Payer: Self-pay

## 2019-02-07 DIAGNOSIS — C20 Malignant neoplasm of rectum: Secondary | ICD-10-CM | POA: Diagnosis not present

## 2019-02-07 MED ORDER — SODIUM CHLORIDE 0.9 % IV SOLN
Freq: Once | INTRAVENOUS | Status: AC
  Administered 2019-02-07: 12:00:00 via INTRAVENOUS

## 2019-02-07 MED ORDER — HEPARIN SOD (PORK) LOCK FLUSH 100 UNIT/ML IV SOLN
500.0000 [IU] | Freq: Once | INTRAVENOUS | Status: AC
  Administered 2019-02-07: 500 [IU] via INTRAVENOUS

## 2019-02-12 ENCOUNTER — Encounter: Payer: Self-pay | Admitting: Family Medicine

## 2019-02-12 ENCOUNTER — Other Ambulatory Visit: Payer: Self-pay | Admitting: Family Medicine

## 2019-02-12 MED ORDER — ALPRAZOLAM 0.5 MG PO TABS: ORAL_TABLET | ORAL | 5 refills | Status: DC

## 2019-02-12 MED FILL — PROCHLORPERAZINE 10 MG TAB: 10 | 23 days supply | Qty: 90 | Fill #1

## 2019-02-12 MED FILL — ALPRAZolam 0.5 MG TABS: 0.5 | 30 days supply | Qty: 60 | Fill #0

## 2019-02-15 ENCOUNTER — Encounter (HOSPITAL_COMMUNITY)
Admission: RE | Admit: 2019-02-15 | Discharge: 2019-02-15 | Disposition: A | Discharge status: Home / Self Care | Payer: 59 | Source: Ambulatory Visit | Attending: Hematology | Admitting: Hematology

## 2019-02-15 ENCOUNTER — Other Ambulatory Visit: Payer: Self-pay

## 2019-02-15 ENCOUNTER — Encounter (HOSPITAL_COMMUNITY): Payer: Self-pay

## 2019-02-15 DIAGNOSIS — C20 Malignant neoplasm of rectum: Secondary | ICD-10-CM | POA: Diagnosis not present

## 2019-02-15 MED ORDER — HEPARIN SOD (PORK) LOCK FLUSH 100 UNIT/ML IV SOLN
500.0000 [IU] | Freq: Once | INTRAVENOUS | Status: AC
  Administered 2019-02-15: 500 [IU] via INTRAVENOUS

## 2019-02-15 MED ORDER — SODIUM CHLORIDE 0.9 % IV SOLN
Freq: Once | INTRAVENOUS | Status: AC
  Administered 2019-02-15: 12:00:00 via INTRAVENOUS

## 2019-02-20 MED FILL — ARMOUR THYROID 15 MG TABLET: 15 | 30 days supply | Qty: 30 | Fill #1

## 2019-02-20 MED FILL — ARMOUR THYROID 30 MG TABLET: 30 | 30 days supply | Qty: 30 | Fill #1

## 2019-02-27 ENCOUNTER — Encounter (HOSPITAL_COMMUNITY)
Admission: RE | Admit: 2019-02-27 | Discharge: 2019-02-27 | Disposition: A | Discharge status: Home / Self Care | Payer: 59 | Source: Ambulatory Visit | Attending: Hematology | Admitting: Hematology

## 2019-02-27 ENCOUNTER — Other Ambulatory Visit: Payer: Self-pay

## 2019-02-27 DIAGNOSIS — Z79899 Other long term (current) drug therapy: Secondary | ICD-10-CM | POA: Diagnosis not present

## 2019-02-27 DIAGNOSIS — E039 Hypothyroidism, unspecified: Secondary | ICD-10-CM | POA: Diagnosis not present

## 2019-02-27 DIAGNOSIS — G479 Sleep disorder, unspecified: Secondary | ICD-10-CM | POA: Diagnosis not present

## 2019-02-27 DIAGNOSIS — R110 Nausea: Secondary | ICD-10-CM | POA: Diagnosis not present

## 2019-02-27 DIAGNOSIS — Z808 Family history of malignant neoplasm of other organs or systems: Secondary | ICD-10-CM | POA: Diagnosis not present

## 2019-02-27 DIAGNOSIS — Z85850 Personal history of malignant neoplasm of thyroid: Secondary | ICD-10-CM | POA: Diagnosis not present

## 2019-02-27 DIAGNOSIS — Z8249 Family history of ischemic heart disease and other diseases of the circulatory system: Secondary | ICD-10-CM | POA: Diagnosis not present

## 2019-02-27 DIAGNOSIS — Z8042 Family history of malignant neoplasm of prostate: Secondary | ICD-10-CM | POA: Diagnosis not present

## 2019-02-27 DIAGNOSIS — Z87891 Personal history of nicotine dependence: Secondary | ICD-10-CM | POA: Diagnosis not present

## 2019-02-27 DIAGNOSIS — Z923 Personal history of irradiation: Secondary | ICD-10-CM | POA: Diagnosis not present

## 2019-02-27 DIAGNOSIS — C20 Malignant neoplasm of rectum: Secondary | ICD-10-CM | POA: Diagnosis not present

## 2019-02-27 DIAGNOSIS — Z9221 Personal history of antineoplastic chemotherapy: Secondary | ICD-10-CM | POA: Diagnosis not present

## 2019-02-27 DIAGNOSIS — F329 Major depressive disorder, single episode, unspecified: Secondary | ICD-10-CM | POA: Diagnosis not present

## 2019-02-27 MED ORDER — HEPARIN SOD (PORK) LOCK FLUSH 100 UNIT/ML IV SOLN
500.0000 [IU] | Freq: Once | INTRAVENOUS | Status: AC
  Administered 2019-02-27: 500 [IU] via INTRAVENOUS

## 2019-02-27 MED ORDER — SODIUM CHLORIDE 0.9 % IV SOLN
Freq: Once | INTRAVENOUS | Status: AC
  Administered 2019-02-27: 12:00:00 via INTRAVENOUS

## 2019-03-04 ENCOUNTER — Ambulatory Visit: Payer: Self-pay | Admitting: General Surgery

## 2019-03-04 MED ORDER — DEXTROSE 5 % IV SOLN: 900.0000 mg | INTRAVENOUS | Status: AC

## 2019-03-04 MED ORDER — GENTAMICIN SULFATE 40 MG/ML IJ SOLN: 5.0000 mg/kg | INTRAVENOUS | Status: AC

## 2019-03-04 NOTE — H&P (Signed)
The patient is a 53 year old female who presents with colorectal cancer. 53 year old female with stage 4 rectal cancer. She states she underwent a flexible sigmoidoscopy for rectal bleeding. This showed a distal rectal mass approximately 2-4 cm from anal verge. Biopsies confirm adenocarcinoma. CEA level was 2.1. Initial CT chest showed a 9 mm left lower lobe pulmonary nodule. CT abdomen shows right hepatic lobe hypoattenuation. MRI showed a posterior midline rectal tumor approximately 2.1 cm to the anal sphincter. She completed TNT. She had the mass in her liver ablated at [facility] in Summer 2020. She is a Retail buyer at [employer]. Her husband is a Mudlogger at [employer] .  She underwent a robotic-assisted low anterior resection with diverting ileostomy on December 07, 2018. Her postoperative stay was complicated by ileostomy dysfunction requiring reoperative surgery on POD 4. After that, she recovered well. She was discharged home in stable condition on September 25. She is doing well with her ileostomy. They're having approximally 500-700 mL of output a day. She is urinating several times a day. Post operatively, she had some complaints of fatigue, tachycardia as well as disrupted sleep patterns. She saw her oncologist for this and he decreased her thyroid medication. This seems to have resolved some of her symptoms. She is slowly getting her energy back. This week, she is going back to work full time. She continues to have problems with dehydration and is getting fluids on a weekly basis.   Problem List/Past Medical Leighton Ruff, MD; [id number] 4:55 PM) CHANGE OR REMOVAL OF DRAINS (Z48.03) RECTAL CANCER (C20)  Past Surgical History Leighton Ruff, MD; [id number] 4:55 PM) Gallbladder Surgery - Laparoscopic Oral Surgery Thyroid Surgery  Diagnostic Studies History Leighton Ruff, MD; [id number] 4:55 PM) Mammogram within last year  Allergies Leighton Ruff, MD;  [id number] 4:55 PM) Penicillins Hives. Demerol *ANALGESICS - OPIOID* Itching.  Medication History (Armen Glo Herring, CMA; 03/04/2019 4:31 PM) ALPRAZolam (0.5MG  Tablet, Oral) Active. Armour Thyroid (30MG  Tablet, Oral) Active. Vitamin B 12 (50MCG Tablet, Oral) Active. Compazine (5MG  Tablet, Oral) Active. Tylenol (325MG  Tablet, Oral) Active. Potassium Chloride Crys ER (20MEQ Tablet ER, Oral) Active. Alaway (0.025% Solution, Ophthalmic) Active. Ibuprofen (200MG  Capsule, Oral) Active. Medications Reconciled  Social History Leighton Ruff, MD; [id number] 4:55 PM) Alcohol use Remotely quit alcohol use. Caffeine use Carbonated beverages, Tea. No drug use Tobacco use Former smoker.  Family History Leighton Ruff, MD; [id number] 4:55 PM) Ischemic Bowel Disease Father. Kidney Disease Mother.  Pregnancy / Birth History Leighton Ruff, MD; [id number] 4:55 PM) Age at menarche 48 years. Age of menopause 44-55 Gravida 5 Para 4  Other Problems Leighton Ruff, MD; [id number] 4:55 PM) Anxiety Disorder Cholelithiasis Gastroesophageal Reflux Disease Hemorrhoids Thyroid Cancer    Vitals (Armen Ferguson CMA; 03/04/2019 4:30 PM) 03/04/2019 4:30 PM Weight: 140.13 lb Height: 66in Body Surface Area: 1.72 m Body Mass Index: 22.62 kg/m  Temp.: 98.70F  Pulse: 120 (Regular)  P.OX: 97% (Room air) BP: 120/80 (Sitting, Left Arm, Standard)        Physical Exam Leighton Ruff MD; [id number] 4:55 PM)  General Mental Status-Alert. General Appearance-Not in acute distress. Build & Nutrition-Well nourished. Posture-Normal posture. Gait-Normal.  Head and Neck Head-normocephalic, atraumatic with no lesions or palpable masses. Trachea-midline.  Chest and Lung Exam Chest and lung exam reveals -on auscultation, normal breath sounds, no adventitious sounds and normal vocal resonance.  Cardiovascular Cardiovascular examination reveals -normal  heart sounds, regular rate and rhythm with no murmurs and no digital clubbing, cyanosis, edema, increased warmth or tenderness.  Abdomen  Inspection Inspection of the abdomen reveals - No Hernias. Skin - Ileostomy - Right Lower Quadrant. Palpation/Percussion Palpation and Percussion of the abdomen reveal - Soft, Non Tender, No Rigidity (guarding), No hepatosplenomegaly and No Palpable abdominal masses.  Neurologic Neurologic evaluation reveals -alert and oriented x 3 with no impairment of recent or remote memory, normal attention span and ability to concentrate, normal sensation and normal coordination.  Musculoskeletal Normal Exam - Bilateral-Upper Extremity Strength Normal and Lower Extremity Strength Normal.    Assessment & Plan Leighton Ruff MD; [id number] 4:54 PM)  RECTAL CANCER (C20) Impression: Patient status post low anterior resection with diverting ileostomy. She seems to be well healed. There is no sign of stricture. We had a long discussion today about ileostomy reversal and low anterior syndrome. All questions were answered. I think she would be a good candidate for reversal. She has good sphincter tone. She would like to have this done towards the end of January. Risks include bleeding, infection, hernia, and need for recurrent procedures and operations.

## 2019-03-06 ENCOUNTER — Ambulatory Visit: Payer: 59

## 2019-03-06 ENCOUNTER — Ambulatory Visit (INDEPENDENT_AMBULATORY_CARE_PROVIDER_SITE_OTHER): Payer: 59 | Admitting: Orthopedic Surgery

## 2019-03-06 ENCOUNTER — Other Ambulatory Visit: Payer: Self-pay

## 2019-03-06 VITALS — BP 124/81 | HR 92 | Temp 97.1°F | Ht 66.5 in | Wt 137.0 lb

## 2019-03-06 DIAGNOSIS — M25512 Pain in left shoulder: Secondary | ICD-10-CM

## 2019-03-06 DIAGNOSIS — M7542 Impingement syndrome of left shoulder: Secondary | ICD-10-CM | POA: Diagnosis not present

## 2019-03-06 NOTE — Progress Notes (Signed)
Samantha Reynolds  03/06/2019  Body mass index is 21.78 kg/m.   HISTORY SECTION :  Chief Complaint  Patient presents with  . Shoulder Pain    Left shoulder pain.   HPI The patient presents for evaluation of  (mild/moderate/severe/ ) severe pain, in the (right /left) left shoulder, for several weeks, associated with painful range of motion and decreased range of motion.  Prior treatment none  Patient gives history of prior fall when she was younger and since that time it intermittent shoulder pain.  Patient says pain radiates into the shoulder deltoid and left forearm and has some superior scapular pain as well   Review of Systems  Constitutional: Positive for malaise/fatigue and weight loss.  All other systems reviewed and are negative.    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   Past Surgical History:  Procedure Laterality Date  . BIOPSY  02/19/2018   Procedure: BIOPSY;  Surgeon: Rogene Houston, MD;  [facility];  Service: Endoscopy;;  rectum  . COLONOSCOPY N/A 08/20/2015   Procedure: COLONOSCOPY;  Surgeon: Rogene Houston, MD;  [facility];  Service: Endoscopy;  Laterality: N/A;  730  . [place] ENDOMETRIAL ABLATION  08-13-2004  . DIVERTING ILEOSTOMY N/A 12/07/2018   Procedure: DIVERTING LOOP ILEOSTOMY;  Surgeon: Leighton Ruff, MD;  Location: [facility];  Service: General;  Laterality: N/A;  . FLEXIBLE SIGMOIDOSCOPY N/A 02/19/2018   Procedure: FLEXIBLE SIGMOIDOSCOPY;  Surgeon: Rogene Houston, MD;  [facility];  Service: Endoscopy;  Laterality: N/A;  .  HYSTEROSCOPY W/D&C N/A 04/30/2015   Procedure: DILATATION AND CURETTAGE / INTENDED HYSTEROSCOPY;  Surgeon: Dian Queen, MD;  Location: [place];  Service: Gynecology;  Laterality: N/A;  . IR US GUIDE BX ASP/DRAIN  03/09/2018  . LAPAROSCOPIC CHOLECYSTECTOMY  04/1996  . LAPAROSCOPY N/A 12/11/2018   Procedure: LAPAROSCOPY  ILEOSTOMY REVISION AND ABDOMINAL WASHOUT;  Surgeon: Leighton Ruff, MD;  [facility]: [facility];  Service: General;  Laterality: N/A;  . Liver Microwave   10/17/2018  . POLYPECTOMY  08/20/2015   Procedure: POLYPECTOMY;  Surgeon: Rogene Houston, MD;  [facility];  Service: Endoscopy;;  Splenic flexure polypectomy  . POLYPECTOMY  02/19/2018   Procedure: POLYPECTOMY;  Surgeon: Rogene Houston, MD;  [facility];  Service: Endoscopy;;  rectum  . PORTACATH PLACEMENT N/A 03/14/2018   Procedure: INSERTION PORT-A-CATH;  Surgeon: Aviva Signs, MD;  Location: [facility];  Service: General;  Laterality: N/A;  . REDUCTION INCARCERATED UTERUS  06-20-2000   intrauterine preg. 13 wks /  urinary retention  . THYROID LOBECTOMY  11/24/2011   Procedure: THYROID LOBECTOMY;  Surgeon: Earnstine Regal, MD;  [facility]: [facility];  Service: General;  Laterality: Left;  Left Thyroid Lobectomy  . TUBAL LIGATION  2002  . XI ROBOTIC ASSISTED LOWER ANTERIOR RESECTION N/A 12/07/2018   Procedure: XI ROBOTIC ASSISTED LOWER ANTERIOR RESECTION, SPENIC FLEXURE IMMOBILIZATION, COLOANAL ANASTOMOSIS, RIGID PROCTOSCOPY;  Surgeon: Leighton Ruff, MD;  Location: [facility];  Service: General;  Laterality: N/A;    Body mass index is 21.78 kg/m.   Allergies  Allergen Reactions  . Demerol [Meperidine] Itching    All over the body.  Marland Kitchen  Penicillins Hives     childhood, does not remember if it spread all over the body or not. Did it involve swelling of the face/tongue/throat, SOB, or low BP? No Did it involve sudden or severe rash/hives, skin peeling, or any reaction on the inside of your mouth  or nose? Yes Did you need to seek medical attention at a hospital or doctor's office? Yes When did it last happen?childhood allergy If all above answers are "NO", may proceed with cephalosporin use.   . Other Hives and Other (See Comments)    Fresh coconut   . Vancomycin Rash    Will need Benadryl prior to administration per IV. "Red man syndrome"     Current Outpatient Medications:  .  ALPRAZolam (XANAX) 0.5 MG tablet, Patient may take a tablet during the day and 1 tablet nightly to help with sleep, Disp: 60 tablet, Rfl: 5 .  lidocaine-prilocaine (EMLA) cream, Apply small amount to port site one hour prior to appointment and cover with plastic wrap. (Patient not taking: Reported on 01/10/2019), Disp: 30 g, Rfl: 3 .  loperamide (IMODIUM) 2 MG capsule, Take 1 capsule (2 mg total) by mouth 3 (three) times daily. (Patient not taking: Reported on 01/10/2019), Disp: 30 capsule, Rfl: 0 .  ondansetron (ZOFRAN) 4 MG tablet, Take 1 tablet (4 mg total) by mouth every 8 (eight) hours as needed for nausea or vomiting. (Patient not taking: Reported on 01/10/2019), Disp: 30 tablet, Rfl: 2 .  potassium chloride SA (K-DUR) 20 MEQ tablet, Take 1 tablet (20 mEq total) by mouth daily. (Patient taking differently: Take 20 mEq by mouth daily with lunch. ), Disp: 30 tablet, Rfl: 1 .  prochlorperazine (COMPAZINE) 10 MG tablet, Take 10 mg by mouth every 6 (six) hours as needed for nausea or vomiting., Disp: , Rfl:  .  thyroid (ARMOUR THYROID) 15 MG tablet, Take 1 tablet (15 mg total) by mouth daily. Take one 15mg  tablet along with one 30 mg tablet, Disp: 30 tablet, Rfl: 1 .  thyroid (ARMOUR THYROID) 30 MG tablet, Take 1 tablet (30 mg total) by mouth daily before breakfast. Take one 30mg  tablet along with a 15mg  tablet daily, Disp: 30 tablet, Rfl: 1 .  vitamin B-12 (CYANOCOBALAMIN) 1000 MCG tablet, Take 1,000 mcg by mouth daily., Disp: , Rfl:  No current facility-administered medications for this visit.    Facility-Administered Medications Ordered in Other Visits:  .  clindamycin (CLEOCIN) 900 mg in dextrose 5 % 50 mL IVPB, 900 mg, Intravenous, 60 min Pre-Op **AND** gentamicin (GARAMYCIN) 350 mg in dextrose 5 % 50 mL IVPB, 5 mg/kg, Intravenous, 60 min Pre-Op, Leighton Ruff, MD .  clindamycin (CLEOCIN) 900 mg in dextrose 5 % 50 mL IVPB, 900 mg, Intravenous, 60 min Pre-Op **AND** gentamicin (GARAMYCIN) 5 mg/kg in dextrose 5 % 50 mL IVPB, 5 mg/kg, Intravenous, 60 min Pre-Op, Leighton Ruff, MD .  sodium chloride 0.9 % 1,000 mL with potassium chloride 20 mEq, magnesium sulfate 2 g infusion, , Intravenous, Once, Derek Jack, MD   PHYSICAL EXAM SECTION: 1) BP 124/81   Pulse 92   Temp (!) 97.1 F (36.2 C)   Ht 5' 6.5" (1.689 m)   Wt 137 lb (62.1 kg)   LMP 02/16/2015 (Approximate)   BMI 21.78 kg/m   Body mass index is 21.78 kg/m. General appearance: Well-developed well-nourished no gross deformities  2) Cardiovascular normal pulse and perfusion in the upper extremities normal color without edema  3) Neurologically deep tendon reflexes are equal and  normal, no sensation loss or deficits no pathologic reflexes  4) Psychological: Awake alert and oriented x3 mood and affect normal  5) Skin no lacerations or ulcerations no nodularity no palpable masses, no erythema or nodularity  6) Musculoskeletal:   Left shoulder small rotator cuff Stable in all positions Tender in the periacromial region alignment is normal Positive impingement sign   MEDICAL DECISION SECTION:  Encounter Diagnoses  Name Primary?  . Left shoulder pain, unspecified chronicity   . Shoulder impingement, left Yes    Imaging X-ray does show a Shenton's line break but no high riding humeral head is seen there is some acromioclavicular joint arthritis Plan:  (Rx., Inj., surg., Frx, MRI/CT, XR:2)  Recommend a subacromial injection call us if not better after couple of weeks  Patient advised about steroid  flare possibility  Procedure note the subacromial injection shoulder left   Verbal consent was obtained to inject the  Left   Shoulder  Timeout was completed to confirm the injection site is a subacromial space of the  left  shoulder  Medication used Depo-Medrol 40 mg and lidocaine 1% 3 cc  Anesthesia was provided by ethyl chloride  The injection was performed in the left  posterior subacromial space. After pinning the skin with alcohol and anesthetized the skin with ethyl chloride the subacromial space was injected using a 20-gauge needle. There were no complications  Sterile dressing was applied.         4:57 PM Arther Abbott, MD  03/06/2019

## 2019-03-06 NOTE — Patient Instructions (Addendum)
You have received an injection of steroids into the joint. 15% of patients will have increased pain within the 24 hours postinjection.   This is transient and will go away.   We recommend that you use ice packs on the injection site for 20 minutes every 2 hours and extra strength Tylenol 2 tablets every 8 as needed until the pain resolves.  If you continue to have pain after taking the Tylenol and using the ice please call the office for further instructions.   Shoulder Impingement Syndrome  Shoulder impingement syndrome is a condition that causes pain when connective tissues (tendons) surrounding the shoulder joint become pinched. These tendons are part of the group of muscles and tissues that help to stabilize the shoulder (rotator cuff). Beneath the rotator cuff is a fluid-filled sac (bursa) that allows the muscles and tendons to glide smoothly. The bursa may become swollen or irritated (bursitis). Bursitis, swelling in the rotator cuff tendons, or both conditions can decrease how much space is under a bone in the shoulder joint (acromion), resulting in impingement. What are the causes? Shoulder impingement syndrome may be caused by bursitis or swelling of the rotator cuff tendons, which may result from:  Repetitive overhead arm movements.  Falling onto the shoulder.  Weakness in the shoulder muscles. What increases the risk? You may be more likely to develop this condition if you:  Play sports that involve throwing, such as baseball.  Participate in sports such as tennis, volleyball, and swimming.  Work as a Curator, Games developer, or Architect. Some people are also more likely to develop impingement syndrome because of the shape of their acromion bone. What are the signs or symptoms? The main symptom of this condition is pain on the front or side of the shoulder. The pain may:  Get worse when lifting or raising the arm.  Get worse at night.  Wake you up from sleeping.  Feel  sharp when the shoulder is moved and then fade to an ache. Other symptoms may include:  Tenderness.  Stiffness.  Inability to raise the arm above shoulder level or behind the body.  Weakness. How is this diagnosed? This condition may be diagnosed based on:  Your symptoms and medical history.  A physical exam.  Imaging tests, such as: ? X-rays. ? MRI. ? Ultrasound. How is this treated? This condition may be treated by:  Resting your shoulder and avoiding all activities that cause pain or put stress on the shoulder.  Icing your shoulder.  NSAIDs to help reduce pain and swelling.  One or more injections of medicines to numb the area and reduce inflammation.  Physical therapy.  Surgery. This may be needed if nonsurgical treatments have not helped. Surgery may involve repairing the rotator cuff, reshaping the acromion, or removing the bursa. Follow these instructions at home: Managing pain, stiffness, and swelling   If directed, put ice on the injured area. ? Put ice in a plastic bag. ? Place a towel between your skin and the bag. ? Leave the ice on for 20 minutes, 2-3 times a day. Activity  Rest and return to your normal activities as told by your health care provider. Ask your health care provider what activities are safe for you.  Do exercises as told by your health care provider. General instructions  Do not use any products that contain nicotine or tobacco, such as cigarettes, e-cigarettes, and chewing tobacco. These can delay healing. If you need help quitting, ask your health care provider.  Ask your health care provider when it is safe for you to drive.  Take over-the-counter and prescription medicines only as told by your health care provider.  Keep all follow-up visits as told by your health care provider. This is important. How is this prevented?  Give your body time to rest between periods of activity.  Be safe and responsible while being active.  This will help you avoid falls.  Maintain physical fitness, including strength and flexibility. Contact a health care provider if:  Your symptoms have not improved after 1-2 months of treatment and rest.  You cannot lift your arm away from your body. Summary  Shoulder impingement syndrome is a condition that causes pain when connective tissues (tendons) surrounding the shoulder joint become pinched.  The main symptom of this condition is pain on the front or side of the shoulder.  This condition is usually treated with rest, ice, and pain medicines as needed. This information is not intended to replace advice given to you by your health care provider. Make sure you discuss any questions you have with your health care provider. Document Released: 03/14/2005 Document Revised: 07/06/2018 Document Reviewed: 09/06/2017 Elsevier Patient Education  2020 [employer].

## 2019-03-07 ENCOUNTER — Encounter (HOSPITAL_COMMUNITY)
Admission: RE | Admit: 2019-03-07 | Discharge: 2019-03-07 | Disposition: A | Discharge status: Home / Self Care | Payer: 59 | Source: Ambulatory Visit | Attending: Hematology | Admitting: Hematology

## 2019-03-07 ENCOUNTER — Inpatient Hospital Stay (HOSPITAL_COMMUNITY): Payer: 59 | Attending: Hematology

## 2019-03-07 DIAGNOSIS — Z87891 Personal history of nicotine dependence: Secondary | ICD-10-CM | POA: Insufficient documentation

## 2019-03-07 DIAGNOSIS — Z9221 Personal history of antineoplastic chemotherapy: Secondary | ICD-10-CM | POA: Insufficient documentation

## 2019-03-07 DIAGNOSIS — Z8042 Family history of malignant neoplasm of prostate: Secondary | ICD-10-CM | POA: Insufficient documentation

## 2019-03-07 DIAGNOSIS — Z79899 Other long term (current) drug therapy: Secondary | ICD-10-CM | POA: Diagnosis not present

## 2019-03-07 DIAGNOSIS — Z808 Family history of malignant neoplasm of other organs or systems: Secondary | ICD-10-CM | POA: Insufficient documentation

## 2019-03-07 DIAGNOSIS — E039 Hypothyroidism, unspecified: Secondary | ICD-10-CM | POA: Diagnosis not present

## 2019-03-07 DIAGNOSIS — E038 Other specified hypothyroidism: Secondary | ICD-10-CM

## 2019-03-07 DIAGNOSIS — R110 Nausea: Secondary | ICD-10-CM | POA: Insufficient documentation

## 2019-03-07 DIAGNOSIS — F329 Major depressive disorder, single episode, unspecified: Secondary | ICD-10-CM | POA: Diagnosis not present

## 2019-03-07 DIAGNOSIS — G479 Sleep disorder, unspecified: Secondary | ICD-10-CM | POA: Diagnosis not present

## 2019-03-07 DIAGNOSIS — E063 Autoimmune thyroiditis: Secondary | ICD-10-CM

## 2019-03-07 DIAGNOSIS — C20 Malignant neoplasm of rectum: Secondary | ICD-10-CM | POA: Diagnosis not present

## 2019-03-07 DIAGNOSIS — Z923 Personal history of irradiation: Secondary | ICD-10-CM | POA: Diagnosis not present

## 2019-03-07 DIAGNOSIS — Z85850 Personal history of malignant neoplasm of thyroid: Secondary | ICD-10-CM | POA: Diagnosis not present

## 2019-03-07 DIAGNOSIS — Z8249 Family history of ischemic heart disease and other diseases of the circulatory system: Secondary | ICD-10-CM | POA: Insufficient documentation

## 2019-03-07 LAB — COMPREHENSIVE METABOLIC PANEL
ALT: 22 U/L (ref 0–44)
AST: 29 U/L (ref 15–41)
Albumin: 4.6 g/dL (ref 3.5–5.0)
Alkaline Phosphatase: 101 U/L (ref 38–126)
Anion gap: 11 (ref 5–15)
BUN: 16 mg/dL (ref 6–20)
CO2: 25 mmol/L (ref 22–32)
Calcium: 9.2 mg/dL (ref 8.9–10.3)
Chloride: 102 mmol/L (ref 98–111)
Creatinine, Ser: 0.85 mg/dL (ref 0.44–1.00)
GFR calc Af Amer: >60 mL/min (ref >60)
GFR calc non Af Amer: >60 mL/min (ref >60)
Glucose, Bld: 95 mg/dL (ref 70–99)
Potassium: 3.7 mmol/L (ref 3.5–5.1)
Sodium: 138 mmol/L (ref 135–145)
Total Bilirubin: 5.7 mg/dL — ABNORMAL HIGH (ref 0.3–1.2)
Total Protein: 7.8 g/dL (ref 6.5–8.1)

## 2019-03-07 LAB — CBC WITH DIFFERENTIAL/PLATELET
Abs Immature Granulocytes: 0.01 10*3/uL (ref 0.00–0.07)
Basophils Absolute: 0 10*3/uL (ref 0.0–0.1)
Basophils Relative: 0 %
Eosinophils Absolute: 0 10*3/uL (ref 0.0–0.5)
Eosinophils Relative: 1 %
HCT: 32.7 % — ABNORMAL LOW (ref 36.0–46.0)
Hemoglobin: 11.1 g/dL — ABNORMAL LOW (ref 12.0–15.0)
Immature Granulocytes: 0 %
Lymphocytes Relative: 14 %
Lymphs Abs: 0.6 10*3/uL — ABNORMAL LOW (ref 0.7–4.0)
MCH: 32.8 pg (ref 26.0–34.0)
MCHC: 33.9 g/dL (ref 30.0–36.0)
MCV: 96.7 fL (ref 80.0–100.0)
Monocytes Absolute: 0.2 10*3/uL (ref 0.1–1.0)
Monocytes Relative: 6 %
Neutro Abs: 3.4 10*3/uL (ref 1.7–7.7)
Neutrophils Relative %: 79 %
Platelets: 127 10*3/uL — ABNORMAL LOW (ref 150–400)
RBC: 3.38 MIL/uL — ABNORMAL LOW (ref 3.87–5.11)
RDW: 13.3 % (ref 11.5–15.5)
WBC: 4.3 10*3/uL (ref 4.0–10.5)
nRBC: 0 % (ref 0.0–0.2)

## 2019-03-07 LAB — TSH: TSH: 2.209 u[IU]/mL (ref 0.350–4.500)

## 2019-03-07 MED ORDER — SODIUM CHLORIDE 0.9 % IV SOLN
Freq: Once | INTRAVENOUS | Status: AC
  Administered 2019-03-07: 11:00:00 via INTRAVENOUS

## 2019-03-07 MED ORDER — HEPARIN SOD (PORK) LOCK FLUSH 100 UNIT/ML IV SOLN: 500.0000 [IU] | Freq: Once | INTRAVENOUS | Status: DC

## 2019-03-12 ENCOUNTER — Encounter (HOSPITAL_COMMUNITY)
Admission: RE | Admit: 2019-03-12 | Discharge: 2019-03-12 | Disposition: A | Discharge status: Home / Self Care | Payer: 59 | Source: Ambulatory Visit | Attending: Hematology | Admitting: Hematology

## 2019-03-12 DIAGNOSIS — F329 Major depressive disorder, single episode, unspecified: Secondary | ICD-10-CM | POA: Diagnosis not present

## 2019-03-12 DIAGNOSIS — Z923 Personal history of irradiation: Secondary | ICD-10-CM | POA: Diagnosis not present

## 2019-03-12 DIAGNOSIS — Z79899 Other long term (current) drug therapy: Secondary | ICD-10-CM | POA: Diagnosis not present

## 2019-03-12 DIAGNOSIS — G479 Sleep disorder, unspecified: Secondary | ICD-10-CM | POA: Diagnosis not present

## 2019-03-12 DIAGNOSIS — E039 Hypothyroidism, unspecified: Secondary | ICD-10-CM | POA: Diagnosis not present

## 2019-03-12 DIAGNOSIS — R110 Nausea: Secondary | ICD-10-CM | POA: Diagnosis not present

## 2019-03-12 DIAGNOSIS — Z85850 Personal history of malignant neoplasm of thyroid: Secondary | ICD-10-CM | POA: Diagnosis not present

## 2019-03-12 DIAGNOSIS — C20 Malignant neoplasm of rectum: Secondary | ICD-10-CM | POA: Diagnosis not present

## 2019-03-12 DIAGNOSIS — Z9221 Personal history of antineoplastic chemotherapy: Secondary | ICD-10-CM | POA: Diagnosis not present

## 2019-03-12 MED ORDER — HEPARIN SOD (PORK) LOCK FLUSH 100 UNIT/ML IV SOLN
500.0000 [IU] | INTRAVENOUS | Status: AC | PRN
  Administered 2019-03-12: 500 [IU]

## 2019-03-12 MED ORDER — HEPARIN SOD (PORK) LOCK FLUSH 100 UNIT/ML IV SOLN
INTRAVENOUS | Status: AC
  Filled 2019-03-12: qty 5

## 2019-03-12 MED ORDER — SODIUM CHLORIDE 0.9 % IV SOLN
INTRAVENOUS | Status: AC
  Administered 2019-03-12: 1000 mL via INTRAVENOUS

## 2019-03-18 ENCOUNTER — Other Ambulatory Visit (HOSPITAL_COMMUNITY): Payer: Self-pay | Admitting: Obstetrics and Gynecology

## 2019-03-18 DIAGNOSIS — Z1231 Encounter for screening mammogram for malignant neoplasm of breast: Secondary | ICD-10-CM

## 2019-03-18 MED FILL — ALPRAZolam 0.5 MG TABS: 0.5 | 30 days supply | Qty: 60 | Fill #1

## 2019-03-19 ENCOUNTER — Encounter (HOSPITAL_COMMUNITY)
Admission: RE | Admit: 2019-03-19 | Discharge: 2019-03-19 | Disposition: A | Discharge status: Home / Self Care | Payer: 59 | Source: Ambulatory Visit | Attending: Hematology | Admitting: Hematology

## 2019-03-19 ENCOUNTER — Other Ambulatory Visit: Payer: Self-pay

## 2019-03-19 DIAGNOSIS — Z9221 Personal history of antineoplastic chemotherapy: Secondary | ICD-10-CM | POA: Diagnosis not present

## 2019-03-19 DIAGNOSIS — G479 Sleep disorder, unspecified: Secondary | ICD-10-CM | POA: Diagnosis not present

## 2019-03-19 DIAGNOSIS — Z85850 Personal history of malignant neoplasm of thyroid: Secondary | ICD-10-CM | POA: Diagnosis not present

## 2019-03-19 DIAGNOSIS — E039 Hypothyroidism, unspecified: Secondary | ICD-10-CM | POA: Diagnosis not present

## 2019-03-19 DIAGNOSIS — Z923 Personal history of irradiation: Secondary | ICD-10-CM | POA: Diagnosis not present

## 2019-03-19 DIAGNOSIS — Z79899 Other long term (current) drug therapy: Secondary | ICD-10-CM | POA: Diagnosis not present

## 2019-03-19 DIAGNOSIS — C20 Malignant neoplasm of rectum: Secondary | ICD-10-CM | POA: Diagnosis not present

## 2019-03-19 DIAGNOSIS — R110 Nausea: Secondary | ICD-10-CM | POA: Diagnosis not present

## 2019-03-19 DIAGNOSIS — F329 Major depressive disorder, single episode, unspecified: Secondary | ICD-10-CM | POA: Diagnosis not present

## 2019-03-19 MED ORDER — SODIUM CHLORIDE 0.9 % IV SOLN: Freq: Once | INTRAVENOUS | Status: AC

## 2019-03-19 MED ORDER — HEPARIN SOD (PORK) LOCK FLUSH 100 UNIT/ML IV SOLN
500.0000 [IU] | Freq: Once | INTRAVENOUS | Status: AC
  Administered 2019-03-19: 500 [IU] via INTRAVENOUS

## 2019-03-25 ENCOUNTER — Ambulatory Visit (HOSPITAL_COMMUNITY)
Admission: RE | Admit: 2019-03-25 | Discharge: 2019-03-25 | Disposition: A | Discharge status: Home / Self Care | Payer: 59 | Source: Ambulatory Visit | Attending: Obstetrics and Gynecology | Admitting: Obstetrics and Gynecology

## 2019-03-25 ENCOUNTER — Other Ambulatory Visit (HOSPITAL_COMMUNITY): Payer: Self-pay | Admitting: *Deleted

## 2019-03-25 ENCOUNTER — Other Ambulatory Visit: Payer: Self-pay

## 2019-03-25 DIAGNOSIS — E038 Other specified hypothyroidism: Secondary | ICD-10-CM

## 2019-03-25 DIAGNOSIS — Z1231 Encounter for screening mammogram for malignant neoplasm of breast: Secondary | ICD-10-CM | POA: Diagnosis not present

## 2019-03-25 MED ORDER — THYROID 15 MG PO TABS: 15.0000 mg | ORAL_TABLET | Freq: Every day | ORAL | 1 refills | Status: DC

## 2019-03-25 MED ORDER — THYROID 30 MG PO TABS: 30.0000 mg | ORAL_TABLET | Freq: Every day | ORAL | 1 refills | Status: DC

## 2019-03-25 MED FILL — ARMOUR THYROID 30 MG TABLET: 30 | 30 days supply | Qty: 30 | Fill #0

## 2019-03-25 MED FILL — ARMOUR THYROID 15 MG TABLET: 15 | 30 days supply | Qty: 30 | Fill #0

## 2019-03-26 DIAGNOSIS — Z932 Ileostomy status: Secondary | ICD-10-CM | POA: Diagnosis not present

## 2019-03-27 ENCOUNTER — Ambulatory Visit (INDEPENDENT_AMBULATORY_CARE_PROVIDER_SITE_OTHER): Payer: 59 | Admitting: Orthopedic Surgery

## 2019-03-27 ENCOUNTER — Other Ambulatory Visit: Payer: Self-pay

## 2019-03-27 VITALS — BP 111/74 | HR 84 | Temp 97.2°F | Ht 66.5 in | Wt 137.0 lb

## 2019-03-27 DIAGNOSIS — M25512 Pain in left shoulder: Secondary | ICD-10-CM | POA: Diagnosis not present

## 2019-03-27 NOTE — Patient Instructions (Addendum)
This is the second injection into the shoulder wants you to come back in 4 weeks  No heavy lifting  Start physical therapy  Tylenol 500 mg every 6 hours is okay for pain or an over-the-counter medication such as Advil or Aleve  It is also okay to use a liniment such as  These are the muscle and arthrits creams I recommend:  PLEASE READ THE PACKAGE INSTRUCTIONS BEFORE USING   Ben Gay arthritis cream  Icy hot vanishing gel  Aspercreme odor free  Myoflex Oderless pain reliever  Capzasin  Sportscreme  Max freeze

## 2019-03-27 NOTE — Progress Notes (Signed)
Progress Note   Patient ID: Samantha Reynolds, female   DOB: July 06, 1965, 53 y.o.   MRN: [id number]  Body mass index is 21.78 kg/m.  Chief Complaint  Patient presents with  . Follow-up    Recheck on left shoulder.    No diagnosis found.  HPI  53 year old female presented with severe pain in left shoulder for several weeks associated with painful range of motion and decreased range of motion she did get some improvement with the injection on  12/ 9   Has limitations in abduction but her extension has improved and her overall pain is improved    ROS  No new issues from 10 December   BP 111/74   Pulse 84   Temp (!) 97.2 F (36.2 C)   Ht 5' 6.5" (1.689 m)   Wt 137 lb (62.1 kg)   LMP 02/16/2015 (Approximate)   BMI 21.78 kg/m   Physical Exam   Medical decisions:  (Established problem worse, x-ray ,physical therapy, over-the-counter medicines, read outside film or summarize x-ray)  Data  Imaging:   No new x-rays  No diagnosis found.  PLAN:   Repeat injection  Procedure note the subacromial injection shoulder left   Verbal consent was obtained to inject the  Left   Shoulder  Timeout was completed to confirm the injection site is a subacromial space of the  left  shoulder  Medication used Depo-Medrol 40 mg and lidocaine 1% 3 cc  Anesthesia was provided by ethyl chloride  The injection was performed in the left  posterior subacromial space. After pinning the skin with alcohol and anesthetized the skin with ethyl chloride the subacromial space was injected using a 20-gauge needle. There were no complications  Sterile dressing was applied.  Order physical therapy  Follow-up 6 weeks         Arther Abbott, MD 03/27/2019 4:05 PM

## 2019-03-28 ENCOUNTER — Encounter (HOSPITAL_COMMUNITY)
Admission: RE | Admit: 2019-03-28 | Discharge: 2019-03-28 | Disposition: A | Discharge status: Home / Self Care | Payer: 59 | Source: Ambulatory Visit | Attending: Hematology | Admitting: Hematology

## 2019-03-28 DIAGNOSIS — G479 Sleep disorder, unspecified: Secondary | ICD-10-CM | POA: Diagnosis not present

## 2019-03-28 DIAGNOSIS — Z79899 Other long term (current) drug therapy: Secondary | ICD-10-CM | POA: Diagnosis not present

## 2019-03-28 DIAGNOSIS — C20 Malignant neoplasm of rectum: Secondary | ICD-10-CM | POA: Diagnosis not present

## 2019-03-28 DIAGNOSIS — R110 Nausea: Secondary | ICD-10-CM | POA: Diagnosis not present

## 2019-03-28 DIAGNOSIS — Z85850 Personal history of malignant neoplasm of thyroid: Secondary | ICD-10-CM | POA: Diagnosis not present

## 2019-03-28 DIAGNOSIS — Z9221 Personal history of antineoplastic chemotherapy: Secondary | ICD-10-CM | POA: Diagnosis not present

## 2019-03-28 DIAGNOSIS — F329 Major depressive disorder, single episode, unspecified: Secondary | ICD-10-CM | POA: Diagnosis not present

## 2019-03-28 DIAGNOSIS — Z923 Personal history of irradiation: Secondary | ICD-10-CM | POA: Diagnosis not present

## 2019-03-28 DIAGNOSIS — E039 Hypothyroidism, unspecified: Secondary | ICD-10-CM | POA: Diagnosis not present

## 2019-03-28 MED ORDER — SODIUM CHLORIDE 0.9 % IV SOLN: Freq: Once | INTRAVENOUS | Status: AC

## 2019-03-28 MED ORDER — HEPARIN SOD (PORK) LOCK FLUSH 100 UNIT/ML IV SOLN
500.0000 [IU] | Freq: Once | INTRAVENOUS | Status: AC
  Administered 2019-03-28: 500 [IU] via INTRAVENOUS

## 2019-03-29 DIAGNOSIS — Z932 Ileostomy status: Secondary | ICD-10-CM | POA: Diagnosis not present

## 2019-04-01 ENCOUNTER — Other Ambulatory Visit (HOSPITAL_COMMUNITY): Payer: Self-pay | Admitting: *Deleted

## 2019-04-01 ENCOUNTER — Other Ambulatory Visit (HOSPITAL_COMMUNITY): Payer: Self-pay | Admitting: Nurse Practitioner

## 2019-04-01 DIAGNOSIS — C20 Malignant neoplasm of rectum: Secondary | ICD-10-CM

## 2019-04-02 ENCOUNTER — Other Ambulatory Visit (HOSPITAL_COMMUNITY): Payer: 59

## 2019-04-03 NOTE — Addendum Note (Signed)
Addended byCandice Camp on: 04/03/2019 11:23 AM   Modules accepted: Orders

## 2019-04-04 ENCOUNTER — Encounter (HOSPITAL_COMMUNITY): Payer: Self-pay | Admitting: Occupational Therapy

## 2019-04-04 ENCOUNTER — Ambulatory Visit (HOSPITAL_COMMUNITY): Payer: 59 | Admitting: Occupational Therapy

## 2019-04-04 ENCOUNTER — Other Ambulatory Visit: Payer: Self-pay

## 2019-04-04 DIAGNOSIS — L539 Erythematous condition, unspecified: Secondary | ICD-10-CM | POA: Diagnosis not present

## 2019-04-04 DIAGNOSIS — Z8042 Family history of malignant neoplasm of prostate: Secondary | ICD-10-CM | POA: Diagnosis not present

## 2019-04-04 DIAGNOSIS — C20 Malignant neoplasm of rectum: Secondary | ICD-10-CM | POA: Diagnosis not present

## 2019-04-04 DIAGNOSIS — Z8249 Family history of ischemic heart disease and other diseases of the circulatory system: Secondary | ICD-10-CM | POA: Diagnosis not present

## 2019-04-04 DIAGNOSIS — Z79899 Other long term (current) drug therapy: Secondary | ICD-10-CM | POA: Diagnosis not present

## 2019-04-04 DIAGNOSIS — R29898 Other symptoms and signs involving the musculoskeletal system: Secondary | ICD-10-CM

## 2019-04-04 DIAGNOSIS — M25512 Pain in left shoulder: Secondary | ICD-10-CM | POA: Insufficient documentation

## 2019-04-04 DIAGNOSIS — Z808 Family history of malignant neoplasm of other organs or systems: Secondary | ICD-10-CM | POA: Diagnosis not present

## 2019-04-04 DIAGNOSIS — R918 Other nonspecific abnormal finding of lung field: Secondary | ICD-10-CM | POA: Diagnosis not present

## 2019-04-04 DIAGNOSIS — Z801 Family history of malignant neoplasm of trachea, bronchus and lung: Secondary | ICD-10-CM | POA: Diagnosis not present

## 2019-04-04 DIAGNOSIS — R200 Anesthesia of skin: Secondary | ICD-10-CM | POA: Diagnosis not present

## 2019-04-04 DIAGNOSIS — R110 Nausea: Secondary | ICD-10-CM | POA: Diagnosis not present

## 2019-04-04 DIAGNOSIS — Z923 Personal history of irradiation: Secondary | ICD-10-CM | POA: Diagnosis not present

## 2019-04-04 DIAGNOSIS — M25612 Stiffness of left shoulder, not elsewhere classified: Secondary | ICD-10-CM | POA: Insufficient documentation

## 2019-04-04 DIAGNOSIS — C787 Secondary malignant neoplasm of liver and intrahepatic bile duct: Secondary | ICD-10-CM | POA: Diagnosis not present

## 2019-04-04 DIAGNOSIS — G479 Sleep disorder, unspecified: Secondary | ICD-10-CM | POA: Diagnosis not present

## 2019-04-04 DIAGNOSIS — Z9221 Personal history of antineoplastic chemotherapy: Secondary | ICD-10-CM | POA: Diagnosis not present

## 2019-04-04 DIAGNOSIS — Z87891 Personal history of nicotine dependence: Secondary | ICD-10-CM | POA: Diagnosis not present

## 2019-04-04 DIAGNOSIS — E039 Hypothyroidism, unspecified: Secondary | ICD-10-CM | POA: Diagnosis not present

## 2019-04-04 DIAGNOSIS — Z85850 Personal history of malignant neoplasm of thyroid: Secondary | ICD-10-CM | POA: Diagnosis not present

## 2019-04-04 NOTE — Patient Instructions (Signed)
  1) Flexion Wall Stretch    Face wall, place affected handon wall in front of you. Slide hand up the wall  and lean body in towards the wall. Hold for 10 seconds. Repeat 3-5 times. 1-2 times/day.     2) Towel Stretch with Internal Rotation   Or     Gently pull up (or to the side) your affected arm  behind your back with the assist of a towel. Hold 10 seconds, repeat 3-5 times. 1-2 times/day.             3) Corner Stretch    Stand at a corner of a wall, place your arms on the walls with elbows bent. Lean into the corner until a stretch is felt along the front of your chest and/or shoulders. Hold for 10 seconds. Repeat 3-5X, 1-2 times/day.    4) Posterior Capsule Stretch    Bring the involved arm across chest. Grasp elbow and pull toward chest until you feel a stretch in the back of the upper arm and shoulder. Hold 10 seconds. Repeat 3-5X. Complete 1-2 times/day.    5) Scapular Retraction    Tuck chin back as you pinch shoulder blades together.  Hold 5 seconds. Repeat 3-5X. Complete 1-2 times/day.    6) External Rotation Stretch:     Place your affected hand on the wall with the elbow bent and gently turn your body the opposite direction until a stretch is felt. Hold 10 seconds, repeat 3-5X. Complete 1-2 times/day.     

## 2019-04-05 NOTE — Therapy (Signed)
[place], [place], [address] Phone: [phone]   Fax:  [phone]  Occupational Therapy Evaluation  Patient Details  Name: Samantha Reynolds MRN: [id number] Date of Birth: [birth year] Referring Provider (OT): Dr. Arther Abbott   Encounter Date: 04/04/2019  OT End of Session - 04/05/19 0900    Visit Number  1    Number of Visits  3    Date for OT Re-Evaluation  04/12/19    Authorization Type  [employer]    Authorization Time Period  no visit limit, review after 25 visits    Authorization - Visit Number  1    Authorization - Number of Visits  25    OT Start Time  [id number]    OT Stop Time  1721    OT Time Calculation (min)  40 min    Activity Tolerance  Patient tolerated treatment well    Behavior During Therapy  [facility] for tasks assessed/performed       Past Medical History:  Diagnosis Date  . Anxiety   . Cancer [facility]) 2013   thyroid  . Endometrial polyp   . Endometrial thickening on ultra sound   . GERD (gastroesophageal reflux disease)   . 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   Worse in her 65s when she was ill  . H/O Hashimoto thyroiditis   . History of chemotherapy   . History of radiation therapy   . 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 Hyperlipidemia 11/09/2015  . Hypothyroidism   . 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[place]) 02/26/2018    Past Surgical History:  Procedure Laterality Date  . BIOPSY  02/19/2018   Procedure: BIOPSY;  Surgeon: Rogene Houston, MD;  [facility];  Service: Endoscopy;;  rectum  . COLONOSCOPY N/A 08/20/2015   Procedure: COLONOSCOPY;  Surgeon: Rogene Houston, MD;  [facility];  Service: Endoscopy;  Laterality: N/A;  730  . [place] ENDOMETRIAL ABLATION  08-13-2004  . DIVERTING ILEOSTOMY N/A 12/07/2018   Procedure: DIVERTING LOOP  ILEOSTOMY;  Surgeon: Leighton Ruff, MD;  [facility];  Service: General;  Laterality: N/A;  . FLEXIBLE SIGMOIDOSCOPY N/A 02/19/2018   Procedure: FLEXIBLE SIGMOIDOSCOPY;  Surgeon: Rogene Houston, MD;  [facility];  Service: Endoscopy;  Laterality: N/A;  . HYSTEROSCOPY WITH D & C N/A 04/30/2015   Procedure: DILATATION AND CURETTAGE / INTENDED HYSTEROSCOPY;  Surgeon: Dian Queen, MD;  Location: [place];  Service: Gynecology;  Laterality: N/A;  . IR US GUIDE BX ASP/DRAIN  03/09/2018  . LAPAROSCOPIC CHOLECYSTECTOMY  04/1996  . LAPAROSCOPY N/A 12/11/2018   Procedure: LAPAROSCOPY  ILEOSTOMY REVISION AND ABDOMINAL WASHOUT;  Surgeon: Leighton Ruff, MD;  [facility];  Service: General;  Laterality: N/A;  . Liver Microwave   10/17/2018  . POLYPECTOMY  08/20/2015   Procedure: POLYPECTOMY;  Surgeon: Rogene Houston, MD;  [facility];  Service: Endoscopy;;  Splenic flexure polypectomy  . POLYPECTOMY  02/19/2018   Procedure: POLYPECTOMY;  Surgeon: Rogene Houston, MD;  [facility];  Service: Endoscopy;;  rectum  . PORTACATH PLACEMENT N/A 03/14/2018   Procedure: INSERTION PORT-A-CATH;  Surgeon: Aviva Signs, MD;  [facility];  Service: General;  Laterality: N/A;  . REDUCTION INCARCERATED UTERUS  06-20-2000   intrauterine preg. 13 wks /  urinary retention  . THYROID LOBECTOMY  11/24/2011   Procedure: THYROID LOBECTOMY;  Surgeon: Earnstine Regal, MD;  [facility];  Service: General;  Laterality: Left;  Left Thyroid Lobectomy  . TUBAL LIGATION  2002  . XI ROBOTIC ASSISTED LOWER ANTERIOR RESECTION N/A 12/07/2018   Procedure: XI ROBOTIC ASSISTED LOWER ANTERIOR RESECTION, SPENIC FLEXURE IMMOBILIZATION, COLOANAL ANASTOMOSIS, RIGID PROCTOSCOPY;  Surgeon: Leighton Ruff, MD;  [facility];  Service: General;  Laterality: N/A;    There were no vitals filed for this visit.  Subjective Assessment - 04/05/19 0756    Subjective   S: It's  really been bothering my since my surgery.    Pertinent History  Pt is a 54 y/o female with history of chronic shoulder pain from playing volleyball over 20 years ago. Pt had a lower anterior resection in September and has had increased pain in her left shoulder since this sx. Pt has had 2 injections which have helped with the pain somewhat. Pt was referred to occupational therapy for evaluation and treatment by Dr. Arther Abbott.    Special Tests  FOTO: 59/100    Patient Stated Goals  To decrease the pain in my arm and be able to use it more.    Currently in Pain?  No/denies        [facility] OT Assessment - 04/04/19 1639      Assessment   Medical Diagnosis  left shoulder pain    Referring Provider (OT)  Dr. 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       Written Expression   Dominant Hand  Right      Cognition   Overall Cognitive Status  Within Functional Limits for tasks assessed      Observation/Other Assessments   Focus on Therapeutic Outcomes (FOTO)   59/100      ROM / Strength   AROM / PROM / Strength  AROM;PROM;Strength      Palpation   Palpation comment  small muscle knot at upper trapezius      AROM   Overall AROM Comments  Assessed seated, er/IR adducted    AROM Assessment Site  Shoulder    Right/Left Shoulder   Left    Left Shoulder Flexion  92 Degrees    Left Shoulder ABduction  69 Degrees    Left Shoulder Internal Rotation  90 Degrees    Left Shoulder External Rotation  65 Degrees      PROM   Overall PROM Comments  Assessed supine, er/IR adducted    PROM Assessment Site  Shoulder  Right/Left Shoulder  Left    Left Shoulder Flexion  120 Degrees    Left Shoulder ABduction  86 Degrees    Left Shoulder Internal Rotation  90 Degrees    Left Shoulder External Rotation  53 Degrees      Strength   Overall Strength Comments  Assessed seated, er/IR adducted    Strength Assessment Site  Shoulder    Right/Left Shoulder  Left    Left Shoulder Flexion  4-/5    Left Shoulder ABduction  4-/5    Left Shoulder Internal Rotation  5/5    Left Shoulder External Rotation  4+/5                      OT Education - 04/04/19 1707    Education Details  shoulder stretches    Person(s) Educated  Patient    Methods  Explanation;Demonstration;Handout    Comprehension  Verbalized understanding;Returned demonstration       OT Short Term Goals - 04/05/19 0906      OT SHORT TERM GOAL #1   Title  Pt will be provided with and educated on HEP to improve mobility in LUE required for ADL completion.    Time  1    Period  Weeks    Status  New    Target Date  04/12/19      OT SHORT TERM GOAL #2   Title  Pt will increase LUE A/ROM to [facility] to increase ability to perform reaching tasks overhead and behind back during daily tasks.    Time  1    Period  Weeks    Status  New      OT SHORT TERM GOAL #3   Title  Pt will be educated on self-myofascial release techniques to decrease fascial restrictions and improve ability to perform functional reaching tasks with LUE.    Time  1    Period  Weeks    Status  New      OT SHORT TERM GOAL #4   Title  Pt will be educated on pain management techniques to decrease pain and improve ability to perform ADLs using LUE as assist.    Time  1    Period  Weeks     Status  New               Plan - 04/05/19 0901    Clinical Impression Statement  A: Pt is a 54 y/o female presenting with left shoulder pain worsening since a surgery in September. Pt reports pain with reaching tasks and limited ability to perform lifting and carrying tasks. During evaluation OT notes pt with hard end feel, ROM limited to approximately 65-75%. Pt has upcoming sx on 04/17/19 and is unable to attend any therapy or appts after required pre-op COVID test on 04/15/2019. Pt would like to attend therapy next week and get HEPs to continue after surgery.    OT Occupational Profile and History  Problem Focused Assessment - Including review of records relating to presenting problem    Occupational performance deficits (Please refer to evaluation for details):  ADL's;IADL's;Rest and Sleep;Work    Marketing executive / Function / Physical Skills  ADL;Endurance;UE functional use;Fascial restriction;Pain;ROM;IADL;Strength    Rehab Potential  Good    Clinical Decision Making  Limited treatment options, no task modification necessary    Comorbidities Affecting Occupational Performance:  None    OT Frequency  2x / week    OT Duration  --  1 week   OT Treatment/Interventions  Self-care/ADL training;Ultrasound;Patient/family education;Passive range of motion;Cryotherapy;Electrical Stimulation;Moist Heat;Therapeutic exercise;Manual Therapy;Therapeutic activities    Plan  P: Pt will benefit from skilled OT services to decrease pain and fascial restrictions, increase joint ROM, strength, and functional use of LUE. Treatment plan: manual therapy, P/ROM, A/ROM, shoulder stretches, scapular mobility, HEP    Consulted and Agree with Plan of Care  Patient       Patient will benefit from skilled therapeutic intervention in order to improve the following deficits and impairments:   Body Structure / Function / Physical Skills: ADL, Endurance, UE functional use, Fascial restriction, Pain, ROM, IADL,  Strength       Visit Diagnosis: Acute pain of left shoulder  Other symptoms and signs involving the musculoskeletal system  Stiffness of left shoulder, not elsewhere classified    Problem List Patient Active Problem List   Diagnosis Date Noted  . 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 H/O Hashimoto thyroiditis   . Hypothyroidism   . Rectal cancer metastasized to liver ([place]) 10/04/2018  . Hyperbilirubinemia 04/24/2018  . Abnormal EKG 03/28/2018  . Severe sepsis ([place]) 03/28/2018  . Sepsis due to undetermined organism ([place]) 03/27/2018  . GERD (gastroesophageal reflux disease) 03/27/2018  . Anxiety 03/27/2018  . Lactic acidosis 03/27/2018  . Hypokalemia 03/27/2018  . Antineoplastic chemotherapy induced pancytopenia ([place]) 03/27/2018  . Hyperglycemia 03/27/2018  . 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[place]) 02/26/2018  . Hematochezia 01/24/2018  . 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 Hyperlipidemia 11/09/2015  . Sciatica of right side 06/26/2012  . History of thyroid cancer 04/02/2012  . 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 Pain in joint, ankle and foot 03/09/2011   Guadelupe Sabin, OTR/L  [phone] 04/05/2019, 9:09 AM  [place] [address] [place], [place], [address] Phone: [phone]   Fax:  [phone]  Name: Samantha Reynolds MRN: [id number] Date of Birth: 05-04-1965

## 2019-04-09 ENCOUNTER — Inpatient Hospital Stay (HOSPITAL_COMMUNITY): Payer: 59

## 2019-04-09 ENCOUNTER — Other Ambulatory Visit: Payer: Self-pay

## 2019-04-09 ENCOUNTER — Ambulatory Visit (HOSPITAL_COMMUNITY)
Admission: RE | Admit: 2019-04-09 | Discharge: 2019-04-09 | Disposition: A | Discharge status: Home / Self Care | Payer: 59 | Source: Ambulatory Visit | Attending: Hematology | Admitting: Hematology

## 2019-04-09 DIAGNOSIS — C20 Malignant neoplasm of rectum: Secondary | ICD-10-CM | POA: Insufficient documentation

## 2019-04-09 LAB — CBC WITH DIFFERENTIAL/PLATELET
Abs Immature Granulocytes: 0.01 10*3/uL (ref 0.00–0.07)
Basophils Absolute: 0 10*3/uL (ref 0.0–0.1)
Basophils Relative: 1 %
Eosinophils Absolute: 0 10*3/uL (ref 0.0–0.5)
Eosinophils Relative: 1 %
HCT: 38.8 % (ref 36.0–46.0)
Hemoglobin: 13.1 g/dL (ref 12.0–15.0)
Immature Granulocytes: 0 %
Lymphocytes Relative: 17 %
Lymphs Abs: 0.8 10*3/uL (ref 0.7–4.0)
MCH: 33.5 pg (ref 26.0–34.0)
MCHC: 33.8 g/dL (ref 30.0–36.0)
MCV: 99.2 fL (ref 80.0–100.0)
Monocytes Absolute: 0.2 10*3/uL (ref 0.1–1.0)
Monocytes Relative: 5 %
Neutro Abs: 3.4 10*3/uL (ref 1.7–7.7)
Neutrophils Relative %: 76 %
Platelets: 153 10*3/uL (ref 150–400)
RBC: 3.91 MIL/uL (ref 3.87–5.11)
RDW: 14.4 % (ref 11.5–15.5)
WBC: 4.4 10*3/uL (ref 4.0–10.5)
nRBC: 0 % (ref 0.0–0.2)

## 2019-04-09 LAB — COMPREHENSIVE METABOLIC PANEL
ALT: 27 U/L (ref 0–44)
AST: 30 U/L (ref 15–41)
Albumin: 5 g/dL (ref 3.5–5.0)
Alkaline Phosphatase: 90 U/L (ref 38–126)
Anion gap: 9 (ref 5–15)
BUN: 13 mg/dL (ref 6–20)
CO2: 25 mmol/L (ref 22–32)
Calcium: 9.4 mg/dL (ref 8.9–10.3)
Chloride: 101 mmol/L (ref 98–111)
Creatinine, Ser: 0.77 mg/dL (ref 0.44–1.00)
GFR calc Af Amer: >60 mL/min (ref >60)
GFR calc non Af Amer: >60 mL/min (ref >60)
Glucose, Bld: 82 mg/dL (ref 70–99)
Potassium: 3.2 mmol/L — ABNORMAL LOW (ref 3.5–5.1)
Sodium: 135 mmol/L (ref 135–145)
Total Bilirubin: 5.5 mg/dL — ABNORMAL HIGH (ref 0.3–1.2)
Total Protein: 8.5 g/dL — ABNORMAL HIGH (ref 6.5–8.1)

## 2019-04-09 MED ORDER — IOHEXOL 300 MG/ML  SOLN
100.0000 mL | Freq: Once | INTRAMUSCULAR | Status: AC | PRN
  Administered 2019-04-09: 09:00:00 100 mL via INTRAVENOUS

## 2019-04-10 ENCOUNTER — Ambulatory Visit (HOSPITAL_COMMUNITY): Payer: 59 | Admitting: Specialist

## 2019-04-10 ENCOUNTER — Encounter: Payer: Self-pay | Admitting: Family Medicine

## 2019-04-10 ENCOUNTER — Encounter (HOSPITAL_COMMUNITY): Payer: Self-pay | Admitting: Specialist

## 2019-04-10 DIAGNOSIS — R918 Other nonspecific abnormal finding of lung field: Secondary | ICD-10-CM | POA: Diagnosis not present

## 2019-04-10 DIAGNOSIS — M25512 Pain in left shoulder: Secondary | ICD-10-CM

## 2019-04-10 DIAGNOSIS — R110 Nausea: Secondary | ICD-10-CM | POA: Diagnosis not present

## 2019-04-10 DIAGNOSIS — M25612 Stiffness of left shoulder, not elsewhere classified: Secondary | ICD-10-CM

## 2019-04-10 DIAGNOSIS — G479 Sleep disorder, unspecified: Secondary | ICD-10-CM | POA: Diagnosis not present

## 2019-04-10 DIAGNOSIS — R29898 Other symptoms and signs involving the musculoskeletal system: Secondary | ICD-10-CM

## 2019-04-10 DIAGNOSIS — C20 Malignant neoplasm of rectum: Secondary | ICD-10-CM | POA: Diagnosis not present

## 2019-04-10 DIAGNOSIS — C787 Secondary malignant neoplasm of liver and intrahepatic bile duct: Secondary | ICD-10-CM | POA: Diagnosis not present

## 2019-04-10 DIAGNOSIS — E039 Hypothyroidism, unspecified: Secondary | ICD-10-CM | POA: Diagnosis not present

## 2019-04-10 DIAGNOSIS — L539 Erythematous condition, unspecified: Secondary | ICD-10-CM | POA: Diagnosis not present

## 2019-04-10 DIAGNOSIS — R200 Anesthesia of skin: Secondary | ICD-10-CM | POA: Diagnosis not present

## 2019-04-10 LAB — CEA: CEA: 1.4 ng/mL (ref 0.0–4.7)

## 2019-04-10 NOTE — Therapy (Signed)
[place], [place], [address] Phone: [phone]   Fax:  [phone]  Occupational Therapy Treatment  Patient Details  Name: Samantha Reynolds MRN: [id number] Date of Birth: September 27, 1965 Referring Provider (OT): Dr. Arther Abbott   Encounter Date: 04/10/2019  OT End of Session - 04/10/19 1028    Visit Number  2    Number of Visits  3    Date for OT Re-Evaluation  04/12/19    Authorization Type  [employer]    Authorization Time Period  no visit limit, review after 25 visits    Authorization - Visit Number  2    Authorization - Number of Visits  25    OT Start Time  [phone]    OT Stop Time  0920    OT Time Calculation (min)  45 min    Activity Tolerance  Patient tolerated treatment well    Behavior During Therapy  [facility] for tasks assessed/performed       Past Medical History:  Diagnosis Date  . Anxiety   . Cancer [facility]) 2013   thyroid  . Endometrial polyp   . Endometrial thickening on ultra sound   . GERD (gastroesophageal reflux disease)   . 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   Worse in her 10s when she was ill  . H/O Hashimoto thyroiditis   . History of chemotherapy   . History of radiation therapy   . 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 Hyperlipidemia 11/09/2015  . Hypothyroidism   . 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[place]) 02/26/2018    Past Surgical History:  Procedure Laterality Date  . BIOPSY  02/19/2018   Procedure: BIOPSY;  Surgeon: Rogene Houston, MD;  [facility];  Service: Endoscopy;;  rectum  . COLONOSCOPY N/A 08/20/2015   Procedure: COLONOSCOPY;  Surgeon: Rogene Houston, MD;  [facility];  Service: Endoscopy;  Laterality: N/A;  730  . [place] ENDOMETRIAL ABLATION  08-13-2004  . DIVERTING ILEOSTOMY N/A 12/07/2018   Procedure: DIVERTING LOOP  ILEOSTOMY;  Surgeon: Leighton Ruff, MD;  [facility];  Service: General;  Laterality: N/A;  . FLEXIBLE SIGMOIDOSCOPY N/A 02/19/2018   Procedure: FLEXIBLE SIGMOIDOSCOPY;  Surgeon: Rogene Houston, MD;  [facility];  Service: Endoscopy;  Laterality: N/A;  . HYSTEROSCOPY WITH D & C N/A 04/30/2015   Procedure: DILATATION AND CURETTAGE / INTENDED HYSTEROSCOPY;  Surgeon: Dian Queen, MD;  Location: [place];  Service: Gynecology;  Laterality: N/A;  . IR US GUIDE BX ASP/DRAIN  03/09/2018  . LAPAROSCOPIC CHOLECYSTECTOMY  04/1996  . LAPAROSCOPY N/A 12/11/2018   Procedure: LAPAROSCOPY  ILEOSTOMY REVISION AND ABDOMINAL WASHOUT;  Surgeon: Leighton Ruff, MD;  [facility];  Service: General;  Laterality: N/A;  . Liver Microwave   10/17/2018  . POLYPECTOMY  08/20/2015   Procedure: POLYPECTOMY;  Surgeon: Rogene Houston, MD;  [facility];  Service: Endoscopy;;  Splenic flexure polypectomy  . POLYPECTOMY  02/19/2018   Procedure: POLYPECTOMY;  Surgeon: Rogene Houston, MD;  [facility];  Service: Endoscopy;;  rectum  . PORTACATH PLACEMENT N/A 03/14/2018   Procedure: INSERTION PORT-A-CATH;  Surgeon: Aviva Signs, MD;  [facility]: [facility];  Service: General;  Laterality: N/A;  . REDUCTION INCARCERATED UTERUS  06-20-2000   intrauterine preg. 13 wks /  urinary retention  . THYROID LOBECTOMY  11/24/2011   Procedure: THYROID LOBECTOMY;  Surgeon: Earnstine Regal, MD;  [facility];  Service: General;  Laterality: Left;  Left Thyroid Lobectomy  . TUBAL LIGATION  2002  . XI ROBOTIC ASSISTED LOWER ANTERIOR RESECTION N/A 12/07/2018   Procedure: XI ROBOTIC ASSISTED LOWER ANTERIOR RESECTION, SPENIC FLEXURE IMMOBILIZATION, COLOANAL ANASTOMOSIS, RIGID PROCTOSCOPY;  Surgeon: Leighton Ruff, MD;  [facility];  Service: General;  Laterality: N/A;    There were no vitals filed for this visit.  Subjective Assessment - 04/10/19 0856    Subjective   S:  I  think it might be a little less painful.  I just dont understand why it wont move    Currently in Pain?  Yes    Pain Score  6     Pain Location  Shoulder    Pain Orientation  Left    Pain Descriptors / Indicators  Burning    Pain Type  Acute pain    Aggravating Factors   with movement at end range         [facility] OT Assessment - 04/10/19 0001      Assessment   Medical Diagnosis  left shoulder pain    Referring Provider (OT)  Dr. Arther Abbott      Precautions   Precautions  Other (comment)    Precaution Comments  pt has port in left chest wall               OT Treatments/Exercises (OP) - 04/10/19 0001      Exercises   Exercises  Shoulder      Shoulder Exercises: Supine   Protraction  PROM;AAROM;10 reps    Horizontal ABduction  PROM;AAROM;10 reps    External Rotation  PROM;AAROM;10 reps    Internal Rotation  PROM;AAROM;10 reps    Flexion  PROM;AAROM;10 reps    ABduction  PROM;AAROM;10 reps      Shoulder Exercises: Seated   Elevation  AROM;10 reps    Extension  AROM;10 reps    Row  AROM;10 reps      Shoulder Exercises: Therapy Ball   Flexion  15 reps    ABduction  15 reps    Other Therapy Ball Exercises  =      Shoulder Exercises: ROM/Strengthening   Thumb Tacks  1' verbal cuing to depress shoulders     Prot/Ret//Elev/Dep  1' verbal cuing to depress shoulders      Manual Therapy   Manual Therapy  Myofascial release    Manual therapy comments  manual therapy completed seperately from all other interventions    Myofascial Release  myofascial release and manual stretchint to left upper arm, scapular, and shoulder region to decrease pain and restrictions and improve pain free mobiity             OT Education - 04/10/19 0857    Education Details  reviewed goals, possible causes of current pain and movement restrictions    Person(s) Educated  Patient    Methods  Explanation    Comprehension  Verbalized understanding       OT Short Term Goals -  04/10/19 1033      OT SHORT TERM GOAL #1   Title  Pt will be provided with and educated on HEP to improve mobility in LUE required for ADL completion.    Time  1    Period  Weeks    Status  On-going    Target Date  04/12/19      OT SHORT TERM GOAL #2   Title  Pt will increase LUE A/ROM to [facility] to increase ability to perform reaching tasks overhead and behind back during daily tasks.    Time  1    Period  Weeks    Status  On-going      OT SHORT TERM GOAL #3   Title  Pt will be educated on self-myofascial release techniques to decrease fascial restrictions and improve ability to perform functional reaching tasks with LUE.    Time  1    Period  Weeks    Status  On-going      OT SHORT TERM GOAL #4   Title  Pt will be educated on pain management techniques to decrease pain and improve ability to perform ADLs using LUE as assist.    Time  1    Period  Weeks    Status  New               Plan - 04/10/19 1029    Clinical Impression Statement  A:  Patient able to tolerate P/ROM through 60%, and aa/rom through 70% range in supine.  Occassional cuing required for depressing shoulder during active exercises.    Body Structure / Function / Physical Skills  ADL;Endurance;UE functional use;Fascial restriction;Pain;ROM;IADL;Strength    OT Treatment/Interventions  Self-care/ADL training;Ultrasound;Patient/family education;Passive range of motion;Cryotherapy;Electrical Stimulation;Moist Heat;Therapeutic exercise;Manual Therapy;Therapeutic activities    Plan  P:  Review and update HEP, place patient on hold until she has recovered from upcoming surgery.       Patient will benefit from skilled therapeutic intervention in order to improve the following deficits and impairments:   Body Structure / Function / Physical Skills: ADL, Endurance, UE functional use, Fascial restriction, Pain, ROM, IADL, Strength       Visit Diagnosis: Acute pain of left shoulder  Other symptoms and signs  involving the musculoskeletal system  Stiffness of left shoulder, not elsewhere classified    Problem List Patient Active Problem List   Diagnosis Date Noted  . 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 H/O Hashimoto thyroiditis   . Hypothyroidism   . Rectal cancer metastasized to liver ([place]) 10/04/2018  . Hyperbilirubinemia 04/24/2018  . Abnormal EKG 03/28/2018  . Severe sepsis ([place]) 03/28/2018  . Sepsis due to undetermined organism ([place]) 03/27/2018  . GERD (gastroesophageal reflux disease) 03/27/2018  . Anxiety 03/27/2018  . Lactic acidosis 03/27/2018  . Hypokalemia 03/27/2018  . Antineoplastic chemotherapy induced pancytopenia ([place]) 03/27/2018  . Hyperglycemia 03/27/2018  . 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[place]) 02/26/2018  . Hematochezia 01/24/2018  . 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 Hyperlipidemia 11/09/2015  . Sciatica of right side 06/26/2012  . History of thyroid cancer 04/02/2012  . 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 Pain in joint, ankle and foot 03/09/2011    Vangie Bicker, [place], OTR/L [phone]  04/10/2019, 10:34 AM  [place], [place], [address] Phone: [phone]   Fax:  [phone]  Name: Samantha Reynolds MRN: [id number] Date of Birth: 25-Mar-1966

## 2019-04-11 ENCOUNTER — Other Ambulatory Visit: Payer: Self-pay | Admitting: *Deleted

## 2019-04-11 ENCOUNTER — Other Ambulatory Visit: Payer: Self-pay | Admitting: Family Medicine

## 2019-04-11 ENCOUNTER — Encounter: Payer: Self-pay | Admitting: Family Medicine

## 2019-04-11 ENCOUNTER — Ambulatory Visit (HOSPITAL_COMMUNITY): Payer: 59 | Admitting: Occupational Therapy

## 2019-04-11 ENCOUNTER — Encounter (HOSPITAL_COMMUNITY): Payer: Self-pay | Admitting: Hematology

## 2019-04-11 ENCOUNTER — Encounter (HOSPITAL_COMMUNITY): Payer: Self-pay | Admitting: Occupational Therapy

## 2019-04-11 ENCOUNTER — Inpatient Hospital Stay (HOSPITAL_COMMUNITY): Payer: 59 | Attending: Hematology | Admitting: Hematology

## 2019-04-11 ENCOUNTER — Other Ambulatory Visit: Payer: Self-pay

## 2019-04-11 DIAGNOSIS — R29898 Other symptoms and signs involving the musculoskeletal system: Secondary | ICD-10-CM | POA: Insufficient documentation

## 2019-04-11 DIAGNOSIS — E038 Other specified hypothyroidism: Secondary | ICD-10-CM | POA: Diagnosis not present

## 2019-04-11 DIAGNOSIS — R200 Anesthesia of skin: Secondary | ICD-10-CM | POA: Diagnosis not present

## 2019-04-11 DIAGNOSIS — C787 Secondary malignant neoplasm of liver and intrahepatic bile duct: Secondary | ICD-10-CM | POA: Insufficient documentation

## 2019-04-11 DIAGNOSIS — L539 Erythematous condition, unspecified: Secondary | ICD-10-CM | POA: Insufficient documentation

## 2019-04-11 DIAGNOSIS — R110 Nausea: Secondary | ICD-10-CM | POA: Diagnosis not present

## 2019-04-11 DIAGNOSIS — Z8249 Family history of ischemic heart disease and other diseases of the circulatory system: Secondary | ICD-10-CM | POA: Insufficient documentation

## 2019-04-11 DIAGNOSIS — C20 Malignant neoplasm of rectum: Secondary | ICD-10-CM | POA: Insufficient documentation

## 2019-04-11 DIAGNOSIS — G479 Sleep disorder, unspecified: Secondary | ICD-10-CM | POA: Diagnosis not present

## 2019-04-11 DIAGNOSIS — E063 Autoimmune thyroiditis: Secondary | ICD-10-CM | POA: Diagnosis not present

## 2019-04-11 DIAGNOSIS — Z923 Personal history of irradiation: Secondary | ICD-10-CM | POA: Insufficient documentation

## 2019-04-11 DIAGNOSIS — E039 Hypothyroidism, unspecified: Secondary | ICD-10-CM | POA: Diagnosis not present

## 2019-04-11 DIAGNOSIS — Z808 Family history of malignant neoplasm of other organs or systems: Secondary | ICD-10-CM | POA: Insufficient documentation

## 2019-04-11 DIAGNOSIS — Z9221 Personal history of antineoplastic chemotherapy: Secondary | ICD-10-CM | POA: Insufficient documentation

## 2019-04-11 DIAGNOSIS — M25512 Pain in left shoulder: Secondary | ICD-10-CM | POA: Insufficient documentation

## 2019-04-11 DIAGNOSIS — Z85850 Personal history of malignant neoplasm of thyroid: Secondary | ICD-10-CM | POA: Insufficient documentation

## 2019-04-11 DIAGNOSIS — M25612 Stiffness of left shoulder, not elsewhere classified: Secondary | ICD-10-CM

## 2019-04-11 DIAGNOSIS — Z8042 Family history of malignant neoplasm of prostate: Secondary | ICD-10-CM | POA: Insufficient documentation

## 2019-04-11 DIAGNOSIS — Z801 Family history of malignant neoplasm of trachea, bronchus and lung: Secondary | ICD-10-CM | POA: Insufficient documentation

## 2019-04-11 DIAGNOSIS — R918 Other nonspecific abnormal finding of lung field: Secondary | ICD-10-CM | POA: Insufficient documentation

## 2019-04-11 DIAGNOSIS — Z79899 Other long term (current) drug therapy: Secondary | ICD-10-CM | POA: Insufficient documentation

## 2019-04-11 DIAGNOSIS — Z87891 Personal history of nicotine dependence: Secondary | ICD-10-CM | POA: Insufficient documentation

## 2019-04-11 MED ORDER — THYROID 30 MG PO TABS: 30.0000 mg | ORAL_TABLET | Freq: Every day | ORAL | 1 refills | Status: DC

## 2019-04-11 MED ORDER — ALPRAZOLAM 0.5 MG PO TABS: ORAL_TABLET | ORAL | 1 refills | Status: DC

## 2019-04-11 MED ORDER — THYROID 15 MG PO TABS: 15.0000 mg | ORAL_TABLET | Freq: Every day | ORAL | 1 refills | Status: DC

## 2019-04-11 NOTE — Patient Instructions (Signed)
Perform each exercise ____10____ reps. 1-2x days.   Protraction   Start by holding a wand or cane at chest height.  Next, slowly push the wand outwards in front of your body so that your elbows become fully straightened. Then, return to the original position.     Shoulder FLEXION   In the standing position, hold a wand/cane with both arms, palms down on both sides. Raise up the wand/cane allowing your unaffected arm to perform most of the effort. Your affected arm should be partially relaxed.      Internal/External ROTATION  In the standing position, hold a wand/cane with both hands keeping your elbows bent. Move your arms and wand/cane to one side.  Your affected arm should be partially relaxed while your unaffected arm performs most of the effort.       Shoulder ABDUCTION  While holding a wand/cane palm face up on the injured side and palm face down on the uninjured side, slowly raise up your injured arm to the side.                     Horizontal Abduction/Adduction      Straight arms holding cane at shoulder height, bring cane to right, center, left. Repeat starting to left.   Copyright  [employer]. All rights reserved.

## 2019-04-11 NOTE — Progress Notes (Signed)
Medication was approved and sent into pharmacy thank you

## 2019-04-11 NOTE — Progress Notes (Signed)
Samantha Reynolds, [place] [address]   CLINIC:  Medical Oncology/Hematology  PCP:  [name], MD [address] [place] [address] [phone]   REASON FOR VISIT:  Follow-up for rectal cancer   BRIEF ONCOLOGIC HISTORY:  Oncology History  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[place])  02/26/2018 Initial Diagnosis   Rectal cancer ([place])   03/13/2018 Cancer Staging   Staging form: Colon and Rectum, AJCC 8th Edition - Clinical stage from 03/13/2018: Stage IVA (cT3, cN1b, cM1a) - Signed by Derek Jack, MD on 03/13/2018   03/14/2018 - 07/10/2018 Chemotherapy   The patient had palonosetron (ALOXI) injection 0.25 mg, 0.25 mg, Intravenous,  Once, 7 of 8 cycles Administration: 0.25 mg (03/14/2018), 0.25 mg (03/27/2018), 0.25 mg (04/18/2018), 0.25 mg (05/02/2018), 0.25 mg (05/16/2018), 0.25 mg (06/05/2018), 0.25 mg (06/27/2018) leucovorin 800 mg in dextrose 5 % 250 mL infusion, 772 mg, Intravenous,  Once, 7 of 8 cycles Administration: 800 mg (03/14/2018), 800 mg (03/27/2018), 800 mg (05/02/2018), 800 mg (05/16/2018), 700 mg (04/18/2018), 800 mg (06/05/2018), 800 mg (06/27/2018) oxaliplatin (ELOXATIN) 165 mg in dextrose 5 % 500 mL chemo infusion, 85 mg/m2 = 165 mg, Intravenous,  Once, 6 of 7 cycles Dose modification: 68 mg/m2 (80 % of original dose 85 mg/m2, Cycle 4, Reason: Provider Judgment) Administration: 165 mg (03/14/2018), 165 mg (03/27/2018), 130 mg (05/02/2018), 130 mg (05/16/2018), 130 mg (06/05/2018), 130 mg (06/27/2018) panitumumab (VECTIBIX) 500 mg in sodium chloride 0.9 % 100 mL chemo infusion, 480 mg, Intravenous,  Once, 4 of 5 cycles Administration: 500 mg (04/18/2018), 480 mg (05/02/2018), 480 mg (05/16/2018), 480 mg (06/05/2018) fluorouracil (ADRUCIL) chemo injection 750 mg, 400 mg/m2 = 750 mg (100 % of original dose 400 mg/m2), Intravenous,  Once, 6 of 7 cycles Dose modification: 400 mg/m2 (original dose 400 mg/m2, Cycle 1), 400 mg/m2 (original dose 400  mg/m2, Cycle 3) Administration: 750 mg (03/14/2018), 750 mg (04/18/2018), 750 mg (05/02/2018), 750 mg (05/16/2018), 750 mg (06/05/2018), 750 mg (06/27/2018) fosaprepitant (EMEND) 150 mg, dexamethasone (DECADRON) 12 mg in sodium chloride 0.9 % 145 mL IVPB, , Intravenous,  Once, 4 of 5 cycles Administration:  (05/02/2018),  (05/16/2018),  (06/05/2018),  (06/27/2018) fluorouracil (ADRUCIL) 4,650 mg in sodium chloride 0.9 % 57 mL chemo infusion, 2,400 mg/m2 = 4,650 mg, Intravenous, 1 Day/Dose, 7 of 8 cycles Administration: 4,650 mg (03/14/2018), 4,650 mg (03/27/2018), 4,650 mg (04/18/2018), 4,650 mg (05/02/2018), 4,650 mg (05/16/2018), 4,650 mg (06/05/2018), 4,650 mg (06/27/2018)  for chemotherapy treatment.       CANCER STAGING: Cancer Staging Malignant neoplasm of rectum [facility]) Staging form: Colon and Rectum, AJCC 8th Edition - Clinical stage from 03/13/2018: Stage IVA (cT3, cN1b, cM1a) - Signed by Derek Jack, MD on 03/13/2018    INTERVAL HISTORY:  Samantha Reynolds 54 y.o. female seen for follow-up of rectal cancer.  She recently had CT scans done.  Reports appetite and energy levels of 75%.  Numbness in the left foot toes is stable.  Occasional nausea is present which is controlled with Compazine/Zofran.  No new pains reported.  REVIEW OF SYSTEMS:  Review of Systems  Gastrointestinal: Positive for nausea.  Psychiatric/Behavioral: Positive for sleep disturbance. The patient is nervous/anxious.   All other systems reviewed and are negative.    PAST MEDICAL/SURGICAL HISTORY:  Past Medical History:  Diagnosis Date  . Anxiety   . Cancer [facility]) 2013   thyroid  . Endometrial polyp   . Endometrial thickening on ultra sound   . GERD (gastroesophageal  reflux disease)   . 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 H/O Hashimoto thyroiditis   . History of chemotherapy   . History of radiation therapy   . History of thyroid cancer no recurrence   2013--  s/p  left lobe  thyroidectomy--  follicular varient papillary / lymphocyctic thyroiditis  . Hyperlipidemia 11/09/2015  . Hypothyroidism   . 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[place]) 02/26/2018   Past Surgical History:  Procedure Laterality Date  . BIOPSY  02/19/2018   Procedure: BIOPSY;  Surgeon: Rogene Houston, MD;  [facility];  Service: Endoscopy;;  rectum  . COLONOSCOPY N/A 08/20/2015   Procedure: COLONOSCOPY;  Surgeon: Rogene Houston, MD;  [facility];  Service: Endoscopy;  Laterality: N/A;  730  . [place] ENDOMETRIAL ABLATION  08-13-2004  . DIVERTING ILEOSTOMY N/A 12/07/2018   Procedure: DIVERTING LOOP ILEOSTOMY;  Surgeon: Leighton Ruff, MD;  [facility]: [facility];  Service: General;  Laterality: N/A;  . FLEXIBLE SIGMOIDOSCOPY N/A 02/19/2018   Procedure: FLEXIBLE SIGMOIDOSCOPY;  Surgeon: Rogene Houston, MD;  [facility];  Service: Endoscopy;  Laterality: N/A;  . HYSTEROSCOPY WITH D & C N/A 04/30/2015   Procedure: DILATATION AND CURETTAGE / INTENDED HYSTEROSCOPY;  Surgeon: Dian Queen, MD;  Location: [place];  Service: Gynecology;  Laterality: N/A;  . IR US GUIDE BX ASP/DRAIN  03/09/2018  . LAPAROSCOPIC CHOLECYSTECTOMY  04/1996  . LAPAROSCOPY N/A 12/11/2018   Procedure: LAPAROSCOPY  ILEOSTOMY REVISION AND ABDOMINAL WASHOUT;  Surgeon: Leighton Ruff, MD;  [facility];  Service: General;  Laterality: N/A;  . Liver Microwave   10/17/2018  . POLYPECTOMY  08/20/2015   Procedure: POLYPECTOMY;  Surgeon: Rogene Houston, MD;  [facility];  Service: Endoscopy;;  Splenic flexure polypectomy  . POLYPECTOMY  02/19/2018   Procedure: POLYPECTOMY;  Surgeon: Rogene Houston, MD;  [facility];  Service: Endoscopy;;  rectum  . PORTACATH PLACEMENT N/A 03/14/2018   Procedure: INSERTION PORT-A-CATH;  Surgeon: Aviva Signs, MD;  Location: [facility];  Service: General;  Laterality: N/A;  . REDUCTION INCARCERATED UTERUS   06-20-2000   intrauterine preg. 13 wks /  urinary retention  . THYROID LOBECTOMY  11/24/2011   Procedure: THYROID LOBECTOMY;  Surgeon: Earnstine Regal, MD;  [facility];  Service: General;  Laterality: Left;  Left Thyroid Lobectomy  . TUBAL LIGATION  2002  . XI ROBOTIC ASSISTED LOWER ANTERIOR RESECTION N/A 12/07/2018   Procedure: XI ROBOTIC ASSISTED LOWER ANTERIOR RESECTION, SPENIC FLEXURE IMMOBILIZATION, COLOANAL ANASTOMOSIS, RIGID PROCTOSCOPY;  Surgeon: Leighton Ruff, MD;  [facility];  Service: General;  Laterality: N/A;     SOCIAL HISTORY:  Social History   Socioeconomic History  . Marital status: Married    Spouse name: Not on file  . Number of children: 4  . Years of education: Not on file  . Highest education level: Not on file  Occupational History    Comment: Scheduler at [employer]  . Smoking status: Former Smoker    Packs/day: 0.25    Years: 10.00    Pack years: 2.50    Types: Cigarettes    Quit date: 10/31/1991    Years since quitting: 27.4  . Smokeless tobacco: Never Used  Substance and Sexual Activity  . Alcohol use: No  . Drug use: No  . Sexual activity: Not Currently  Other Topics  Concern  . Not on file  Social History Narrative   Lives with husband and 62 year old son   Husband is Interior and spatial designer of [employer]   Social Determinants of Health   Financial Resource Strain:   . Difficulty of Paying Living Expenses: Not on file  Food Insecurity:   . Worried About Charity fundraiser in the Last Year: Not on file  . Ran Out of Food in the Last Year: Not on file  Transportation Needs:   . Lack of Transportation (Medical): Not on file  . Lack of Transportation (Non-Medical): Not on file  Physical Activity:   . Days of Exercise per Week: Not on file  . Minutes of Exercise per Session: Not on file  Stress:   . Feeling of Stress : Not on file  Social Connections:   . Frequency of Communication with Friends and Family: Not on file  .  Frequency of Social Gatherings with Friends and Family: Not on file  . Attends Religious Services: Not on file  . Active Member of Clubs or Organizations: Not on file  . Attends Archivist Meetings: Not on file  . Marital Status: Not on file  Intimate Partner Violence:   . Fear of Current or Ex-Partner: Not on file  . Emotionally Abused: Not on file  . Physically Abused: Not on file  . Sexually Abused: Not on file    FAMILY HISTORY:  Family History  Problem Relation Age of Onset  . Hypertension Mother   . Hypertension Father   . Prostate cancer Father   . Cancer Maternal Aunt        spine/back  . Cancer Paternal Grandfather        lung  . Healthy Son   . Healthy Son   . Healthy Son   . Healthy Son   . Colon cancer Neg Hx     CURRENT MEDICATIONS:  Outpatient Encounter Medications as of 04/11/2019  Medication Sig  . thyroid (ARMOUR THYROID) 15 MG tablet Take 1 tablet (15 mg total) by mouth daily. Take one '15mg'$  tablet along with one 30 mg tablet  . thyroid (ARMOUR THYROID) 30 MG tablet Take 1 tablet (30 mg total) by mouth daily before breakfast. Take one '30mg'$  tablet along with a '15mg'$  tablet daily  . vitamin B-12 (CYANOCOBALAMIN) 1000 MCG tablet Take 1,000 mcg by mouth daily.  . [DISCONTINUED] ALPRAZolam (XANAX) 0.5 MG tablet Patient may take a tablet during the day and 1 tablet nightly to help with sleep  . [DISCONTINUED] thyroid (ARMOUR THYROID) 15 MG tablet Take 1 tablet (15 mg total) by mouth daily. Take one '15mg'$  tablet along with one 30 mg tablet  . [DISCONTINUED] thyroid (ARMOUR THYROID) 30 MG tablet Take 1 tablet (30 mg total) by mouth daily before breakfast. Take one '30mg'$  tablet along with a '15mg'$  tablet daily  . lidocaine-prilocaine (EMLA) cream Apply small amount to port site one hour prior to appointment and cover with plastic wrap. (Patient not taking: Reported on 04/11/2019)  . loperamide (IMODIUM) 2 MG capsule Take 1 capsule (2 mg total) by mouth 3 (three)  times daily. (Patient not taking: Reported on 04/11/2019)  . ondansetron (ZOFRAN) 4 MG tablet Take 1 tablet (4 mg total) by mouth every 8 (eight) hours as needed for nausea or vomiting. (Patient not taking: Reported on 04/11/2019)  . potassium chloride SA (K-DUR) 20 MEQ tablet Take 1 tablet (20 mEq total) by mouth daily. (Patient not taking: Reported  on 04/11/2019)  . prochlorperazine (COMPAZINE) 10 MG tablet Take 10 mg by mouth every 6 (six) hours as needed for nausea or vomiting.   Facility-Administered Encounter Medications as of 04/11/2019  Medication  . clindamycin (CLEOCIN) 900 mg in dextrose 5 % 50 mL IVPB   And  . gentamicin (GARAMYCIN) 350 mg in dextrose 5 % 50 mL IVPB  . clindamycin (CLEOCIN) 900 mg in dextrose 5 % 50 mL IVPB   And  . gentamicin (GARAMYCIN) 5 mg/kg in dextrose 5 % 50 mL IVPB  . sodium chloride 0.9 % 1,000 mL with potassium chloride 20 mEq, magnesium sulfate 2 g infusion    ALLERGIES:  I have reviewed her allergies.  PHYSICAL EXAM:  ECOG Performance status: 0  Vitals:   04/11/19 0832  BP: 115/82  Pulse: (!) 103  Resp: 18  Temp: 97.8 F (36.6 C)  SpO2: 100%   Filed Weights   04/11/19 0832  Weight: 135 lb 3.2 oz (61.3 kg)    Physical Exam Vitals reviewed.  Constitutional:      Appearance: Normal appearance.  Abdominal:     General: There is no distension.     Palpations: There is no mass.  Musculoskeletal:        General: No swelling.  Skin:    General: Skin is warm.  Neurological:     General: No focal deficit present.     Mental Status: She is alert and oriented to person, place, and time.  Psychiatric:        Mood and Affect: Mood normal.     Right upper quadrant ileostomy present.  LABORATORY DATA:  I have reviewed the labs as listed.  CBC    Component Value Date/Time   WBC 4.4 04/09/2019 1004   RBC 3.91 04/09/2019 1004   HGB 13.1 04/09/2019 1004   HGB 13.5 11/03/2015 0841   HCT 38.8 04/09/2019 1004   HCT 39.7 11/03/2015 0841     PLT 153 04/09/2019 1004   PLT 165 11/03/2015 0841   MCV 99.2 04/09/2019 1004   MCV 95 11/03/2015 0841   MCH 33.5 04/09/2019 1004   MCHC 33.8 04/09/2019 1004   RDW 14.4 04/09/2019 1004   RDW 13.2 11/03/2015 0841   LYMPHSABS 0.8 04/09/2019 1004   LYMPHSABS 1.4 11/03/2015 0841   MONOABS 0.2 04/09/2019 1004   EOSABS 0.0 04/09/2019 1004   EOSABS 0.1 11/03/2015 0841   BASOSABS 0.0 04/09/2019 1004   BASOSABS 0.0 11/03/2015 0841   CMP Latest Ref Rng & Units 04/09/2019 03/07/2019 01/22/2019  Glucose 70 - 99 mg/dL 82 95 100(H)  BUN 6 - 20 mg/dL '13 16 10  '$ Creatinine 0.44 - 1.00 mg/dL 0.77 0.85 0.93  Sodium 135 - 145 mmol/L 135 138 140  Potassium 3.5 - 5.1 mmol/L 3.2(L) 3.7 3.6  Chloride 98 - 111 mmol/L 101 102 107  CO2 22 - 32 mmol/L '25 25 24  '$ Calcium 8.9 - 10.3 mg/dL 9.4 9.2 8.8(L)  Total Protein 6.5 - 8.1 g/dL 8.5(H) 7.8 6.9  Total Bilirubin 0.3 - 1.2 mg/dL 5.5(H) 5.7(H) 3.4(H)  Alkaline Phos 38 - 126 U/L 90 101 121  AST 15 - 41 U/L '30 29 29  '$ ALT 0 - 44 U/L '27 22 18       '$ DIAGNOSTIC IMAGING:  I have independently reviewed the scans and discussed with the patient.   I have reviewed Venita Lick LPN's note and agree with the documentation.  I personally performed a face-to-face visit, made revisions and my  assessment and plan is as follows.    ASSESSMENT & PLAN:   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[facility]) 1.  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  Liver biopsy on 03/09/2018 consistent with adenocarcinoma. 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 -Right liver lesion microwave ablation on 10/17/2018 [facility]. -Low anterior resection and diverting ileostomy on 12/07/2018, pathology showing YPT2Y PN 0, 0/6 lymph nodes positive, margins negative. -We reviewed results of CT CAP from  04/09/2019 which showed stable exam with no progressive findings in the chest, abdomen or pelvis.  Tiny low-density posterior right hepatic lesion is stable.  Tiny left lung nodules are unchanged. -CEA on 04/09/2019 was 1.4.  LFTs are normal. -She will have reversal of ileostomy on 04/17/2019.  She did report some erythematous rash on the legs after she take showers.  She reported that developed in the last few weeks.  She thinks it is related to vectibix. -I plan to see her back in 3 months with repeat CT CAP.  2.  Hypothyroidism: -Her last TSH was low at 0.136.  As she was having tachycardia, we decreased her Armour Thyroid dose to 45 mg. -Last TSH improved to 2.2 on 03/07/2019. -I have sent another refill for Armour Thyroid as she will follow-up with her endocrinologist next month.  3.  Hyperbilirubinemia: -She has total bilirubin elevation of 5.5.  She has Gilbert's syndrome.  Her baseline is between 2 and 3. -She reportedly received 2 cortisone injections in her left shoulder for tendinitis recently.  She thinks it might have contributed to it.    Orders placed this encounter:  Orders Placed This Encounter  Procedures  . CT Chest W Contrast  . CT Abdomen Pelvis W Contrast  . CBC with Differential/Platelet  . Comprehensive metabolic panel  . TSH      Derek Jack, [place] [phone]

## 2019-04-11 NOTE — Progress Notes (Signed)
Pt.notified

## 2019-04-11 NOTE — Telephone Encounter (Signed)
Nurses I would suggest that the prescription be 0.5 mg, 1 pill taken 3 times daily as needed, #90, 1 refill Hopefully within 60 days things are going much better and the patient can taper herself back down to the original dose We certainly wish Samantha Reynolds the best with her surgery

## 2019-04-11 NOTE — Therapy (Addendum)
[place], [place], [address] Phone: [phone]   Fax:  [phone]  Occupational Therapy Treatment & Discharge  Patient Details  Name: Samantha Reynolds MRN: [id number] Date of Birth: 1965-07-06 Referring Provider (OT): Dr. Arther Abbott   Encounter Date: 04/11/2019  OT End of Session - 04/11/19 1727    Visit Number  3    Number of Visits  3    Date for OT Re-Evaluation  04/12/19    Authorization Type  [employer]    Authorization Time Period  no visit limit, review after 25 visits    Authorization - Visit Number  3    Authorization - Number of Visits  25    OT Start Time  9892    OT Stop Time  1723    OT Time Calculation (min)  38 min    Activity Tolerance  Patient tolerated treatment well    Behavior During Therapy  [facility] for tasks assessed/performed       Past Medical History:  Diagnosis Date  . Anxiety   . Cancer [facility]) 2013   thyroid  . Endometrial polyp   . Endometrial thickening on ultra sound   . GERD (gastroesophageal reflux disease)   . 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 H/O Hashimoto thyroiditis   . History of chemotherapy   . History of radiation therapy   . 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 Hyperlipidemia 11/09/2015  . Hypothyroidism   . 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[place]) 02/26/2018    Past Surgical History:  Procedure Laterality Date  . BIOPSY  02/19/2018   Procedure: BIOPSY;  Surgeon: Rogene Houston, MD;  [facility];  Service: Endoscopy;;  rectum  . COLONOSCOPY N/A 08/20/2015   Procedure: COLONOSCOPY;  Surgeon: Rogene Houston, MD;  [facility];  Service: Endoscopy;  Laterality: N/A;  730  . [place] ENDOMETRIAL ABLATION  08-13-2004  . DIVERTING ILEOSTOMY N/A 12/07/2018   Procedure: DIVERTING  LOOP ILEOSTOMY;  Surgeon: Leighton Ruff, MD;  [facility]: [facility];  Service: General;  Laterality: N/A;  . FLEXIBLE SIGMOIDOSCOPY N/A 02/19/2018   Procedure: FLEXIBLE SIGMOIDOSCOPY;  Surgeon: Rogene Houston, MD;  [facility];  Service: Endoscopy;  Laterality: N/A;  . HYSTEROSCOPY WITH D & C N/A 04/30/2015   Procedure: DILATATION AND CURETTAGE / INTENDED HYSTEROSCOPY;  Surgeon: Dian Queen, MD;  Location: [facility];  Service: Gynecology;  Laterality: N/A;  . IR US GUIDE BX ASP/DRAIN  03/09/2018  . LAPAROSCOPIC CHOLECYSTECTOMY  04/1996  . LAPAROSCOPY N/A 12/11/2018   Procedure: LAPAROSCOPY  ILEOSTOMY REVISION AND ABDOMINAL WASHOUT;  Surgeon: Leighton Ruff, MD;  [facility];  Service: General;  Laterality: N/A;  . Liver Microwave   10/17/2018  . POLYPECTOMY  08/20/2015   Procedure: POLYPECTOMY;  Surgeon: Rogene Houston, MD;  [facility];  Service: Endoscopy;;  Splenic flexure polypectomy  . POLYPECTOMY  02/19/2018   Procedure: POLYPECTOMY;  Surgeon: Rogene Houston, MD;  [facility];  Service: Endoscopy;;  rectum  . PORTACATH PLACEMENT N/A 03/14/2018   Procedure: INSERTION PORT-A-CATH;  Surgeon: Aviva Signs, MD;  Location: [facility];  Service: General;  Laterality: N/A;  . REDUCTION INCARCERATED UTERUS  06-20-2000   intrauterine preg. 13 wks /  urinary retention  . THYROID LOBECTOMY  11/24/2011   Procedure: THYROID LOBECTOMY;  Surgeon: Earnstine Regal, MD;  [facility];  Service: General;  Laterality: Left;  Left Thyroid Lobectomy  . TUBAL LIGATION  2002  . XI ROBOTIC ASSISTED LOWER ANTERIOR RESECTION N/A 12/07/2018   Procedure: XI ROBOTIC ASSISTED LOWER ANTERIOR RESECTION, SPENIC FLEXURE IMMOBILIZATION, COLOANAL ANASTOMOSIS, RIGID PROCTOSCOPY;  Surgeon: Leighton Ruff, MD;  [facility];  Service: General;  Laterality: N/A;    There were no vitals filed for this visit.  Subjective Assessment - 04/11/19 1644    Subjective   S:  It's a little sore from yesterday.    Currently in Pain?  Yes    Pain Score  1     Pain Location  Shoulder    Pain Orientation  Left    Pain Descriptors / Indicators  Sore    Pain Type  Acute pain    Pain Radiating Towards  n/a    Pain Onset  More than a month ago    Pain Frequency  Constant    Aggravating Factors   end range stretching    Pain Relieving Factors  rest    Effect of Pain on Daily Activities  min/mod effect on ADLs    Multiple Pain Sites  No         [facility] OT Assessment - 04/11/19 1643      Assessment   Medical Diagnosis  left shoulder pain      Precautions   Precautions  Other (comment)    Precaution Comments  pt has port in left chest wall               OT Treatments/Exercises (OP) - 04/11/19 1646      Exercises   Exercises  Shoulder      Shoulder Exercises: Supine   Protraction  PROM;AROM;10 reps    Horizontal ABduction  PROM;AROM;10 reps    External Rotation  PROM;AAROM;10 reps    Internal Rotation  PROM;AAROM;10 reps    Flexion  PROM;AAROM;10 reps    ABduction  PROM;AAROM;10 reps      Shoulder Exercises: Seated   Elevation  AROM;10 reps    Extension  AROM;10 reps    Row  AROM;10 reps    Other Seated Exercises  scapular depression, A/ROM, 10X      Shoulder Exercises: Standing   Protraction  AAROM;10 reps    Horizontal ABduction  AAROM;10 reps    External Rotation  AAROM;10 reps    Internal Rotation  AAROM;10 reps    Flexion  AAROM;10 reps    ABduction  AAROM;10 reps      Shoulder Exercises: Pulleys   Flexion  1 minute    ABduction  1 minute      Shoulder Exercises: ROM/Strengthening   Thumb Tacks  1'    Prot/Ret//Elev/Dep  1'      Manual Therapy   Manual Therapy  Myofascial release    Manual therapy comments  manual therapy completed seperately from all other interventions    Myofascial Release  myofascial release and manual stretchint to left upper arm, scapular, and shoulder region to decrease pain and restrictions and  improve pain free mobiity             OT Education - 04/11/19 1715    Education Details  AA/ROM    Person(s) Educated  Patient    Methods  Explanation;Demonstration;Handout    Comprehension  Verbalized understanding;Returned demonstration  OT Short Term Goals - 04/10/19 1033      OT SHORT TERM GOAL #1   Title  Pt will be provided with and educated on HEP to improve mobility in LUE required for ADL completion.    Time  1    Period  Weeks    Status  On-going    Target Date  04/12/19      OT SHORT TERM GOAL #2   Title  Pt will increase LUE A/ROM to [facility] to increase ability to perform reaching tasks overhead and behind back during daily tasks.    Time  1    Period  Weeks    Status  On-going      OT SHORT TERM GOAL #3   Title  Pt will be educated on self-myofascial release techniques to decrease fascial restrictions and improve ability to perform functional reaching tasks with LUE.    Time  1    Period  Weeks    Status  On-going      OT SHORT TERM GOAL #4   Title  Pt will be educated on pain management techniques to decrease pain and improve ability to perform ADLs using LUE as assist.    Time  1    Period  Weeks    Status  On-going               Plan - 04/11/19 1716    Clinical Impression Statement  A: Continued with manual therapy to left shoulder region, also continued with P/ROM with pt reaching slightly greater than 50% ROM with hard end feel. Pt completing A/ROM and AA/ROM in supine, AA/ROM in standing. Improvement in trapezius depression noted during tasks today. Verbal cuing for form and technique. Reviewed HEP and educated pt on new exercises.    Body Structure / Function / Physical Skills  ADL;Endurance;UE functional use;Fascial restriction;Pain;ROM;IADL;Strength    Plan  P: Hold therapy until pt recovered from upcoming sx. When returns reassess and continue with POC.       Patient will benefit from skilled therapeutic intervention in order to  improve the following deficits and impairments:   Body Structure / Function / Physical Skills: ADL, Endurance, UE functional use, Fascial restriction, Pain, ROM, IADL, Strength       Visit Diagnosis: Acute pain of left shoulder  Other symptoms and signs involving the musculoskeletal system  Stiffness of left shoulder, not elsewhere classified    Problem List Patient Active Problem List   Diagnosis Date Noted  . 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 High output ileostomy ([place]) 12/14/2018  . H/O Hashimoto thyroiditis   . Hypothyroidism   . Rectal cancer metastasized to liver ([place]) 10/04/2018  . Hyperbilirubinemia 04/24/2018  . Abnormal EKG 03/28/2018  . Severe sepsis ([place]) 03/28/2018  . Sepsis due to undetermined organism ([place]) 03/27/2018  . GERD (gastroesophageal reflux disease) 03/27/2018  . Anxiety 03/27/2018  . Lactic acidosis 03/27/2018  . Hypokalemia 03/27/2018  . Antineoplastic chemotherapy induced pancytopenia ([place]) 03/27/2018  . Hyperglycemia 03/27/2018  . 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[place]) 02/26/2018  . Hematochezia 01/24/2018  . 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 Hyperlipidemia 11/09/2015  . Sciatica of right side 06/26/2012  . History of thyroid cancer 04/02/2012  . 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 Pain in joint, ankle and foot 03/09/2011   Guadelupe Sabin, OTR/L  [phone] 04/11/2019, 5:27 PM  [facility] [address] [place], [place], [address] Phone: [phone]   Fax:  [phone]  Name: CATERA HANKINS MRN: [id number] Date of Birth: [birth year]   OCCUPATIONAL THERAPY DISCHARGE SUMMARY  Visits from Start of Care: 3  Current functional level related to goals / functional outcomes: Unknown. Pt did not return for additional therapy services.    Remaining deficits: Unknown   Education / Equipment: HEP Plan: Patient agrees to discharge.  Patient goals were partially met. Patient  is being discharged due to not returning since the last visit.  ?????

## 2019-04-11 NOTE — Patient Instructions (Addendum)
[place] at [facility] Discharge Instructions  You were seen today by Dr. Delton Coombes. He went over your recent lab and scan results. Everything looked stable on your CT scan. We will continue to monitor you with scans and lab work. He will see you back in 3 months for labs, scans and follow up.   Thank you for choosing [place] at [facility] to provide your oncology and hematology care.  To afford each patient quality time with our provider, please arrive at least 15 minutes before your scheduled appointment time.   If you have a lab appointment with the [place] please come in thru the  Main Entrance and check in at the main information desk  You need to re-schedule your appointment should you arrive 10 or more minutes late.  We strive to give you quality time with our providers, and arriving late affects you and other patients whose appointments are after yours.  Also, if you no show three or more times for appointments you may be dismissed from the clinic at the providers discretion.     Again, thank you for choosing [facility].  Our hope is that these requests will decrease the amount of time that you wait before being seen by our physicians.       _____________________________________________________________  Should you have questions after your visit to [facility], please contact our office at [phone] between the hours of 8:00 a.m. and 4:30 p.m.  Voicemails left after 4:00 p.m. will not be returned until the following business day.  For prescription refill requests, have your pharmacy contact our office and allow 72 hours.    Cancer Center Support Programs:   > Cancer Support Group  2nd Tuesday of the month 1pm-2pm, Journey Room

## 2019-04-11 NOTE — Telephone Encounter (Signed)
Left message to return call to get more info.  

## 2019-04-11 NOTE — Telephone Encounter (Signed)
Xanax is written for 0.5mg  one bid and pt states she is not taking it that way. She takes 0.5mg  2 qhs and she wants to change to one and a half tablets bid due to her upcoming surgery.

## 2019-04-11 NOTE — Assessment & Plan Note (Addendum)
1.  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  Liver biopsy on 03/09/2018 consistent with adenocarcinoma. 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 -Right liver lesion microwave ablation on 10/17/2018 at [facility]. -Low anterior resection and diverting ileostomy on 12/07/2018, pathology showing YPT2Y PN 0, 0/6 lymph nodes positive, margins negative. -We reviewed results of CT CAP from 04/09/2019 which showed stable exam with no progressive findings in the chest, abdomen or pelvis.  Tiny low-density posterior right hepatic lesion is stable.  Tiny left lung nodules are unchanged. -CEA on 04/09/2019 was 1.4.  LFTs are normal. -She will have reversal of ileostomy on 04/17/2019.  She did report some erythematous rash on the legs after she take showers.  She reported that developed in the last few weeks.  She thinks it is related to vectibix. -I plan to see her back in 3 months with repeat CT CAP.  2.  Hypothyroidism: -Her last TSH was low at 0.136.  As she was having tachycardia, we decreased her Armour Thyroid dose to 45 mg. -Last TSH improved to 2.2 on 03/07/2019. -I have sent another refill for Armour Thyroid as she will follow-up with her endocrinologist next month.  3.  Hyperbilirubinemia: -She has total bilirubin elevation of 5.5.  She has Gilbert's syndrome.  Her baseline is between 2 and 3. -She reportedly received 2 cortisone injections in her left shoulder for tendinitis recently.  She thinks it might have contributed to it. 

## 2019-04-11 NOTE — Telephone Encounter (Signed)
Called and discussed with pt. Pt verbalized understanding. Med pended and sent to dr scott to sign. Pt wanted to use [facility].

## 2019-04-15 ENCOUNTER — Other Ambulatory Visit: Payer: Self-pay

## 2019-04-15 ENCOUNTER — Other Ambulatory Visit (HOSPITAL_COMMUNITY)
Admission: RE | Admit: 2019-04-15 | Discharge: 2019-04-15 | Disposition: A | Discharge status: Home / Self Care | Payer: 59 | Source: Ambulatory Visit | Attending: General Surgery | Admitting: General Surgery

## 2019-04-15 DIAGNOSIS — K219 Gastro-esophageal reflux disease without esophagitis: Secondary | ICD-10-CM | POA: Diagnosis not present

## 2019-04-15 DIAGNOSIS — Z85850 Personal history of malignant neoplasm of thyroid: Secondary | ICD-10-CM | POA: Diagnosis not present

## 2019-04-15 DIAGNOSIS — E039 Hypothyroidism, unspecified: Secondary | ICD-10-CM | POA: Diagnosis not present

## 2019-04-15 DIAGNOSIS — F419 Anxiety disorder, unspecified: Secondary | ICD-10-CM | POA: Diagnosis not present

## 2019-04-15 DIAGNOSIS — C787 Secondary malignant neoplasm of liver and intrahepatic bile duct: Secondary | ICD-10-CM | POA: Diagnosis not present

## 2019-04-15 DIAGNOSIS — Z432 Encounter for attention to ileostomy: Secondary | ICD-10-CM | POA: Diagnosis not present

## 2019-04-15 DIAGNOSIS — Z85048 Personal history of other malignant neoplasm of rectum, rectosigmoid junction, and anus: Secondary | ICD-10-CM | POA: Diagnosis not present

## 2019-04-15 DIAGNOSIS — Z20822 Contact with and (suspected) exposure to covid-19: Secondary | ICD-10-CM | POA: Insufficient documentation

## 2019-04-15 DIAGNOSIS — Z87891 Personal history of nicotine dependence: Secondary | ICD-10-CM | POA: Diagnosis not present

## 2019-04-15 DIAGNOSIS — Z01812 Encounter for preprocedural laboratory examination: Secondary | ICD-10-CM | POA: Insufficient documentation

## 2019-04-15 LAB — SARS CORONAVIRUS 2 (TAT 6-24 HRS): SARS Coronavirus 2: NEGATIVE

## 2019-04-15 MED FILL — ALPRAZolam 0.5 MG TABS: 0.5 | 30 days supply | Qty: 90 | Fill #0

## 2019-04-15 NOTE — Patient Instructions (Addendum)
DUE TO COVID-19 ONLY ONE VISITOR IS ALLOWED TO COME WITH YOU AND STAY IN THE WAITING ROOM ONLY DURING PRE OP AND PROCEDURE DAY OF SURGERY. THE 1 VISITOR MAY VISIT WITH YOU AFTER SURGERY IN YOUR PRIVATE ROOM DURING VISITING HOURS ONLY!   ONCE YOUR COVID TEST IS COMPLETED, PLEASE BEGIN THE QUARANTINE INSTRUCTIONS AS OUTLINED IN YOUR HANDOUT.                Samantha Reynolds     Your procedure is scheduled on: Wednesday 04/17/2019   Report to [facility] Main  Entrance    Report to admitting at  0930  AM     Call this number if you have problems the morning of surgery [phone]    Remember: Follow Bowel Prep Instructions  From Dr. Marcello Moores office the day before surgery and follow a clear liquid diet!   DRINK 2 PRESURGERY ENSURE DRINKS THE NIGHT BEFORE SURGERY AT  1000 PM AND 1 PRESURGERY DRINK THE DAY OF THE PROCEDURE 3 HOURS PRIOR TO SCHEDULED SURGERY.   NO SOLIDS AFTER MIDNIGHT THE DAY PRIOR TO THE SURGERY  AND  NOTHING BY MOUTH EXCEPT CLEAR LIQUIDS UNTIL THREE HOURS PRIOR TO SCHEDULED SURGERY.   PLEASE FINISH PRESURGERY ENSURE DRINK PER SURGEON ORDER 3 HOURS PRIOR TO SCHEDULED SURGERY TIME WHICH NEEDS TO BE COMPLETED AT   0830 am.    CLEAR LIQUID DIET   Foods Allowed                                                                     Foods Excluded  Coffee and tea, regular and decaf                             liquids that you cannot  Plain Jell-O any favor except red or purple                                           see through such as: Fruit ices (not with fruit pulp)                                     milk, soups, orange juice  Iced Popsicles                                    All solid food Carbonated beverages, regular and diet                                    Cranberry, grape and apple juices Sports drinks like Gatorade Lightly seasoned clear broth or consume(fat free) Sugar, honey syrup  Sample Menu Breakfast                                Lunch  Supper Cranberry juice                    Beef broth                            Chicken broth Jell-O                                     Grape juice                           Apple juice Coffee or tea                        Jell-O                                      Popsicle                                                Coffee or tea                        Coffee or tea  _____________________________________________________________________     BRUSH YOUR TEETH MORNING OF SURGERY AND RINSE YOUR MOUTH OUT, NO CHEWING GUM CANDY OR MINTS.     Take these medicines the morning of surgery with A SIP OF WATER: Alprazolam (Xanax), Thyroid (Armour Thyroid)                                 You may not have any metal on your body including hair pins and              piercings  Do not wear jewelry, make-up, lotions, powders or perfumes, deodorant             Do not wear nail polish on your fingernails.  Do not shave  48 hours prior to surgery.                 Do not bring valuables to the hospital. Samantha Reynolds.  Contacts, dentures or bridgework may not be worn into surgery.  Leave suitcase in the car. After surgery it may be brought to your room.     Patients discharged the day of surgery will not be allowed to drive home. IF YOU ARE HAVING SURGERY AND GOING HOME THE SAME DAY, YOU MUST HAVE AN ADULT TO DRIVE YOU HOME AND  BE WITH YOU FOR 24 HOURS. YOU MAY GO HOME BY TAXI OR UBER OR ORTHERWISE, BUT AN ADULT MUST ACCOMPANY YOU HOME AND STAY WITH YOU FOR 24 HOURS.  Name and phone number of your driver:Samantha Reynolds  [phone]                Please read over the following fact sheets you were given: _____________________________________________________________________             [facility] - Preparing for Surgery Before surgery,  you can play an important role.  Because skin is not sterile, your skin needs to be as free  of germs as possible.  You can reduce the number of germs on your skin by washing with CHG (chlorahexidine gluconate) soap before surgery.  CHG is an antiseptic cleaner which kills germs and bonds with the skin to continue killing germs even after washing. Please DO NOT use if you have an allergy to CHG or antibacterial soaps.  If your skin becomes reddened/irritated stop using the CHG and inform your nurse when you arrive at Short Stay. Do not shave (including legs and underarms) for at least 48 hours prior to the first CHG shower.  You may shave your face/neck. Please follow these instructions carefully:  1.  Shower with CHG Soap the night before surgery and the  morning of Surgery.  2.  If you choose to wash your hair, wash your hair first as usual with your  normal  shampoo.  3.  After you shampoo, rinse your hair and body thoroughly to remove the  shampoo.                           4.  Use CHG as you would any other liquid soap.  You can apply chg directly  to the skin and wash                       Gently with a scrungie or clean washcloth.  5.  Apply the CHG Soap to your body ONLY FROM THE NECK DOWN.   Do not use on face/ open                           Wound or open sores. Avoid contact with eyes, ears mouth and genitals (private parts).                       Wash face,  Genitals (private parts) with your normal soap.             6.  Wash thoroughly, paying special attention to the area where your surgery  will be performed.  7.  Thoroughly rinse your body with warm water from the neck down.  8.  DO NOT shower/wash with your normal soap after using and rinsing off  the CHG Soap.                9.  Pat yourself dry with a clean towel.            10.  Wear clean pajamas.            11.  Place clean sheets on your bed the night of your first shower and do not  sleep with pets. Day of Surgery : Do not apply any lotions/deodorants the morning of surgery.  Please wear clean clothes to the  hospital/surgery center.  FAILURE TO FOLLOW THESE INSTRUCTIONS MAY RESULT IN THE CANCELLATION OF YOUR SURGERY PATIENT SIGNATURE_________________________________  NURSE SIGNATURE__________________________________  ________________________________________________________________________   Samantha Reynolds  An incentive spirometer is a tool that can help keep your lungs clear and active. This tool measures how well you are filling your lungs with each breath. Taking long deep breaths may help reverse or decrease the chance of developing breathing (pulmonary) problems (especially infection) following:  A long period of time when you are unable to move or  be active. BEFORE THE PROCEDURE   If the spirometer includes an indicator to show your best effort, your nurse or respiratory therapist will set it to a desired goal.  If possible, sit up straight or lean slightly forward. Try not to slouch.  Hold the incentive spirometer in an upright position. INSTRUCTIONS FOR USE  1. Sit on the edge of your bed if possible, or sit up as far as you can in bed or on a chair. 2. Hold the incentive spirometer in an upright position. 3. Breathe out normally. 4. Place the mouthpiece in your mouth and seal your lips tightly around it. 5. Breathe in slowly and as deeply as possible, raising the piston or the ball toward the top of the column. 6. Hold your breath for 3-5 seconds or for as long as possible. Allow the piston or ball to fall to the bottom of the column. 7. Remove the mouthpiece from your mouth and breathe out normally. 8. Rest for a few seconds and repeat Steps 1 through 7 at least 10 times every 1-2 hours when you are awake. Take your time and take a few normal breaths between deep breaths. 9. The spirometer may include an indicator to show your best effort. Use the indicator as a goal to work toward during each repetition. 10. After each set of 10 deep breaths, practice coughing to be sure  your lungs are clear. If you have an incision (the cut made at the time of surgery), support your incision when coughing by placing a pillow or rolled up towels firmly against it. Once you are able to get out of bed, walk around indoors and cough well. You may stop using the incentive spirometer when instructed by your caregiver.  RISKS AND COMPLICATIONS  Take your time so you do not get dizzy or light-headed.  If you are in pain, you may need to take or ask for pain medication before doing incentive spirometry. It is harder to take a deep breath if you are having pain. AFTER USE  Rest and breathe slowly and easily.  It can be helpful to keep track of a log of your progress. Your caregiver can provide you with a simple table to help with this. If you are using the spirometer at home, follow these instructions: [place] IF:   You are having difficultly using the spirometer.  You have trouble using the spirometer as often as instructed.  Your pain medication is not giving enough relief while using the spirometer.  You develop fever of 100.5 F (38.1 C) or higher. SEEK IMMEDIATE MEDICAL CARE IF:   You cough up bloody sputum that had not been present before.  You develop fever of 102 F (38.9 C) or greater.  You develop worsening pain at or near the incision site. MAKE SURE YOU:   Understand these instructions.  Will watch your condition.  Will get help right away if you are not doing well or get worse. Document Released: 07/25/2006 Document Revised: 06/06/2011 Document Reviewed: 09/25/2006 [facility] Patient Information 2014 [place], [place].   ________________________________________________________________________

## 2019-04-16 ENCOUNTER — Encounter (HOSPITAL_COMMUNITY): Payer: Self-pay

## 2019-04-16 ENCOUNTER — Encounter (HOSPITAL_COMMUNITY)
Admission: RE | Admit: 2019-04-16 | Discharge: 2019-04-16 | Disposition: A | Discharge status: Home / Self Care | Payer: 59 | Source: Ambulatory Visit | Attending: General Surgery | Admitting: General Surgery

## 2019-04-16 ENCOUNTER — Other Ambulatory Visit: Payer: Self-pay

## 2019-04-16 ENCOUNTER — Encounter (HOSPITAL_COMMUNITY): Admission: RE | Admit: 2019-04-16 | Payer: 59 | Source: Ambulatory Visit

## 2019-04-16 DIAGNOSIS — Z01818 Encounter for other preprocedural examination: Secondary | ICD-10-CM | POA: Insufficient documentation

## 2019-04-16 MED ORDER — BUPIVACAINE LIPOSOME 1.3 % IJ SUSP
20.0000 mL | INTRAMUSCULAR | Status: DC
  Filled 2019-04-16: qty 20

## 2019-04-16 NOTE — Progress Notes (Signed)
PCP - Dr. Sallee Lange Cardiologist - Dr. [place] Pink  Wants patient to have a referral to Cardiology in April after CT scan  Oncology- Dr. Noah Delaine at [facility]  LOV-04/11/2019  Chest x-ray - 12/11/2018 EKG - 11/29/2018 Stress Test - N/A ECHO - many years ago- negative Cardiac Cath - N/A  Sleep Study - n/a  CPAP - n/a  Fasting Blood Sugar - n/a Checks Blood Sugar __0___ times a day  Blood Thinner Instructions:n/a Aspirin Instructions:n/a Last Dose:n/a  Anesthesia review:  Chart given to Konrad Felix, PA to review medical history and lab results.  Patient has a history of Gilbert syndrome, hyperlipidemia, rectal cancer metastasized to liver, GERD, thyroid cancer, s/p ablation of liver mass,and  Ascending thoracic  AAA.  Patient denies shortness of breath, fever, cough and chest pain at PAT appointment   Patient verbalized understanding of instructions that were given to them at the PAT appointment. Patient was also instructed that they will need to review over the PAT instructions again at home before surgery.

## 2019-04-17 ENCOUNTER — Inpatient Hospital Stay (HOSPITAL_COMMUNITY): Payer: 59 | Admitting: Certified Registered Nurse Anesthetist

## 2019-04-17 ENCOUNTER — Encounter (HOSPITAL_COMMUNITY): Payer: Self-pay | Admitting: General Surgery

## 2019-04-17 ENCOUNTER — Encounter (HOSPITAL_COMMUNITY)
Admission: RE | Disposition: A | Discharge status: Home / Self Care | Payer: Self-pay | Source: Home / Self Care | Attending: General Surgery

## 2019-04-17 ENCOUNTER — Inpatient Hospital Stay (HOSPITAL_COMMUNITY)
Admission: RE | Admit: 2019-04-17 | Discharge: 2019-04-20 | DRG: 330 | Disposition: A | Discharge status: Home / Self Care | Payer: 59 | Attending: General Surgery | Admitting: General Surgery

## 2019-04-17 DIAGNOSIS — C20 Malignant neoplasm of rectum: Secondary | ICD-10-CM | POA: Diagnosis not present

## 2019-04-17 DIAGNOSIS — Z808 Family history of malignant neoplasm of other organs or systems: Secondary | ICD-10-CM | POA: Diagnosis not present

## 2019-04-17 DIAGNOSIS — Z85048 Personal history of other malignant neoplasm of rectum, rectosigmoid junction, and anus: Secondary | ICD-10-CM | POA: Diagnosis not present

## 2019-04-17 DIAGNOSIS — Z432 Encounter for attention to ileostomy: Secondary | ICD-10-CM | POA: Diagnosis not present

## 2019-04-17 DIAGNOSIS — Z85850 Personal history of malignant neoplasm of thyroid: Secondary | ICD-10-CM

## 2019-04-17 DIAGNOSIS — Z9221 Personal history of antineoplastic chemotherapy: Secondary | ICD-10-CM

## 2019-04-17 DIAGNOSIS — Z87891 Personal history of nicotine dependence: Secondary | ICD-10-CM | POA: Diagnosis not present

## 2019-04-17 DIAGNOSIS — E039 Hypothyroidism, unspecified: Secondary | ICD-10-CM | POA: Diagnosis present

## 2019-04-17 DIAGNOSIS — F419 Anxiety disorder, unspecified: Secondary | ICD-10-CM | POA: Diagnosis present

## 2019-04-17 DIAGNOSIS — E785 Hyperlipidemia, unspecified: Secondary | ICD-10-CM | POA: Diagnosis not present

## 2019-04-17 DIAGNOSIS — E860 Dehydration: Secondary | ICD-10-CM | POA: Diagnosis present

## 2019-04-17 DIAGNOSIS — C787 Secondary malignant neoplasm of liver and intrahepatic bile duct: Secondary | ICD-10-CM | POA: Diagnosis present

## 2019-04-17 DIAGNOSIS — Z932 Ileostomy status: Secondary | ICD-10-CM

## 2019-04-17 DIAGNOSIS — K219 Gastro-esophageal reflux disease without esophagitis: Secondary | ICD-10-CM | POA: Diagnosis present

## 2019-04-17 DIAGNOSIS — Z20822 Contact with and (suspected) exposure to covid-19: Secondary | ICD-10-CM | POA: Diagnosis present

## 2019-04-17 DIAGNOSIS — E876 Hypokalemia: Secondary | ICD-10-CM | POA: Diagnosis not present

## 2019-04-17 DIAGNOSIS — K9413 Enterostomy malfunction: Secondary | ICD-10-CM | POA: Diagnosis not present

## 2019-04-17 HISTORY — PX: ILEOSTOMY CLOSURE: SHX1784

## 2019-04-17 SURGERY — CLOSURE, ILEOSTOMY
Anesthesia: General | Site: Abdomen

## 2019-04-17 MED ORDER — ONDANSETRON HCL 4 MG/2ML IJ SOLN
4.0000 mg | Freq: Four times a day (QID) | INTRAMUSCULAR | Status: DC | PRN
  Administered 2019-04-17: 4 mg via INTRAVENOUS
  Filled 2019-04-17 (×2): qty 2

## 2019-04-17 MED ORDER — LIDOCAINE 2% (20 MG/ML) 5 ML SYRINGE
INTRAMUSCULAR | Status: AC
  Filled 2019-04-17: qty 5

## 2019-04-17 MED ORDER — FENTANYL CITRATE (PF) 100 MCG/2ML IJ SOLN
INTRAMUSCULAR | Status: AC
  Filled 2019-04-17: qty 2

## 2019-04-17 MED ORDER — FENTANYL CITRATE (PF) 100 MCG/2ML IJ SOLN
25.0000 ug | INTRAMUSCULAR | Status: DC | PRN
  Administered 2019-04-17 (×3): 50 ug via INTRAVENOUS

## 2019-04-17 MED ORDER — HYDROMORPHONE HCL 1 MG/ML IJ SOLN
INTRAMUSCULAR | Status: AC
  Administered 2019-04-17: 0.5 mg
  Filled 2019-04-17: qty 2

## 2019-04-17 MED ORDER — FENTANYL CITRATE (PF) 250 MCG/5ML IJ SOLN
INTRAMUSCULAR | Status: AC
  Filled 2019-04-17: qty 5

## 2019-04-17 MED ORDER — LIDOCAINE 2% (20 MG/ML) 5 ML SYRINGE
INTRAMUSCULAR | Status: DC | PRN
  Administered 2019-04-17: 1.5 mg/kg/h via INTRAVENOUS
  Administered 2019-04-17: 80 mg via INTRAVENOUS

## 2019-04-17 MED ORDER — PHENYLEPHRINE 40 MCG/ML (10ML) SYRINGE FOR IV PUSH (FOR BLOOD PRESSURE SUPPORT)
PREFILLED_SYRINGE | INTRAVENOUS | Status: DC | PRN
  Administered 2019-04-17: 80 ug via INTRAVENOUS
  Administered 2019-04-17: 40 ug via INTRAVENOUS
  Administered 2019-04-17: 80 ug via INTRAVENOUS
  Administered 2019-04-17: 120 ug via INTRAVENOUS
  Administered 2019-04-17: 80 ug via INTRAVENOUS

## 2019-04-17 MED ORDER — MIDAZOLAM HCL 2 MG/2ML IJ SOLN
INTRAMUSCULAR | Status: AC
  Filled 2019-04-17: qty 2

## 2019-04-17 MED ORDER — ENOXAPARIN SODIUM 40 MG/0.4ML ~~LOC~~ SOLN
40.0000 mg | SUBCUTANEOUS | Status: DC
  Administered 2019-04-18 – 2019-04-19 (×2): 40 mg via SUBCUTANEOUS
  Filled 2019-04-17 (×2): qty 0.4

## 2019-04-17 MED ORDER — ONDANSETRON HCL 4 MG/2ML IJ SOLN
4.0000 mg | Freq: Once | INTRAMUSCULAR | Status: AC | PRN
  Administered 2019-04-17: 13:00:00 4 mg via INTRAVENOUS

## 2019-04-17 MED ORDER — BUPIVACAINE LIPOSOME 1.3 % IJ SUSP
INTRAMUSCULAR | Status: DC | PRN
  Administered 2019-04-17: 20 mL

## 2019-04-17 MED ORDER — ALVIMOPAN 12 MG PO CAPS
12.0000 mg | ORAL_CAPSULE | ORAL | Status: AC
  Administered 2019-04-17: 12 mg via ORAL
  Filled 2019-04-17: qty 1

## 2019-04-17 MED ORDER — KCL IN DEXTROSE-NACL 20-5-0.45 MEQ/L-%-% IV SOLN
INTRAVENOUS | Status: DC
  Filled 2019-04-17 (×2): qty 1000

## 2019-04-17 MED ORDER — DEXAMETHASONE SODIUM PHOSPHATE 10 MG/ML IJ SOLN
INTRAMUSCULAR | Status: DC | PRN
  Administered 2019-04-17: 6 mg via INTRAVENOUS

## 2019-04-17 MED ORDER — BUPIVACAINE HCL (PF) 0.25 % IJ SOLN
INTRAMUSCULAR | Status: AC
  Filled 2019-04-17: qty 30

## 2019-04-17 MED ORDER — OXYCODONE HCL 5 MG/5ML PO SOLN: 5.0000 mg | Freq: Once | ORAL | Status: DC | PRN

## 2019-04-17 MED ORDER — KETAMINE HCL 10 MG/ML IJ SOLN
INTRAMUSCULAR | Status: AC
  Filled 2019-04-17: qty 1

## 2019-04-17 MED ORDER — 0.9 % SODIUM CHLORIDE (POUR BTL) OPTIME
TOPICAL | Status: DC | PRN
  Administered 2019-04-17: 2000 mL

## 2019-04-17 MED ORDER — GABAPENTIN 300 MG PO CAPS
300.0000 mg | ORAL_CAPSULE | Freq: Two times a day (BID) | ORAL | Status: DC
  Administered 2019-04-17 – 2019-04-20 (×6): 300 mg via ORAL
  Filled 2019-04-17 (×6): qty 1

## 2019-04-17 MED ORDER — EPHEDRINE SULFATE-NACL 50-0.9 MG/10ML-% IV SOSY
PREFILLED_SYRINGE | INTRAVENOUS | Status: DC | PRN
  Administered 2019-04-17: 10 mg via INTRAVENOUS
  Administered 2019-04-17: 5 mg via INTRAVENOUS

## 2019-04-17 MED ORDER — ALVIMOPAN 12 MG PO CAPS
12.0000 mg | ORAL_CAPSULE | Freq: Two times a day (BID) | ORAL | Status: DC
  Administered 2019-04-18: 12 mg via ORAL
  Filled 2019-04-17 (×2): qty 1

## 2019-04-17 MED ORDER — PROPOFOL 10 MG/ML IV BOLUS
INTRAVENOUS | Status: AC
  Filled 2019-04-17: qty 20

## 2019-04-17 MED ORDER — PHENYLEPHRINE 40 MCG/ML (10ML) SYRINGE FOR IV PUSH (FOR BLOOD PRESSURE SUPPORT)
PREFILLED_SYRINGE | INTRAVENOUS | Status: AC
  Filled 2019-04-17: qty 10

## 2019-04-17 MED ORDER — SACCHAROMYCES BOULARDII 250 MG PO CAPS
250.0000 mg | ORAL_CAPSULE | Freq: Two times a day (BID) | ORAL | Status: DC
  Administered 2019-04-17 – 2019-04-20 (×6): 250 mg via ORAL
  Filled 2019-04-17 (×7): qty 1

## 2019-04-17 MED ORDER — ROCURONIUM BROMIDE 50 MG/5ML IV SOSY
PREFILLED_SYRINGE | INTRAVENOUS | Status: DC | PRN
  Administered 2019-04-17: 50 mg via INTRAVENOUS
  Administered 2019-04-17: 10 mg via INTRAVENOUS

## 2019-04-17 MED ORDER — HYDROMORPHONE HCL 1 MG/ML IJ SOLN
0.5000 mg | INTRAMUSCULAR | Status: DC | PRN
  Filled 2019-04-17: qty 0.5

## 2019-04-17 MED ORDER — OXYCODONE HCL 5 MG PO TABS: 5.0000 mg | ORAL_TABLET | Freq: Once | ORAL | Status: DC | PRN

## 2019-04-17 MED ORDER — PROCHLORPERAZINE EDISYLATE 10 MG/2ML IJ SOLN
10.0000 mg | Freq: Four times a day (QID) | INTRAMUSCULAR | Status: DC | PRN
  Administered 2019-04-17 – 2019-04-18 (×2): 10 mg via INTRAVENOUS
  Filled 2019-04-17 (×2): qty 2

## 2019-04-17 MED ORDER — GABAPENTIN 300 MG PO CAPS
300.0000 mg | ORAL_CAPSULE | ORAL | Status: AC
  Administered 2019-04-17: 300 mg via ORAL
  Filled 2019-04-17: qty 1

## 2019-04-17 MED ORDER — MEPERIDINE HCL 50 MG/ML IJ SOLN: 6.2500 mg | INTRAMUSCULAR | Status: DC | PRN

## 2019-04-17 MED ORDER — ALUM & MAG HYDROXIDE-SIMETH 200-200-20 MG/5ML PO SUSP: 30.0000 mL | Freq: Four times a day (QID) | ORAL | Status: DC | PRN

## 2019-04-17 MED ORDER — LIDOCAINE HCL 2 % IJ SOLN
INTRAMUSCULAR | Status: AC
  Filled 2019-04-17: qty 20

## 2019-04-17 MED ORDER — ACETAMINOPHEN 325 MG PO TABS: 325.0000 mg | ORAL_TABLET | ORAL | Status: DC | PRN

## 2019-04-17 MED ORDER — ONDANSETRON HCL 4 MG/2ML IJ SOLN
INTRAMUSCULAR | Status: AC
  Filled 2019-04-17: qty 2

## 2019-04-17 MED ORDER — MIDAZOLAM HCL 5 MG/5ML IJ SOLN
INTRAMUSCULAR | Status: DC | PRN
  Administered 2019-04-17: 2 mg via INTRAVENOUS

## 2019-04-17 MED ORDER — ALPRAZOLAM 0.5 MG PO TABS
0.5000 mg | ORAL_TABLET | Freq: Three times a day (TID) | ORAL | Status: DC
  Administered 2019-04-17 – 2019-04-19 (×6): 0.5 mg via ORAL
  Filled 2019-04-17 (×6): qty 1

## 2019-04-17 MED ORDER — ACETAMINOPHEN 160 MG/5ML PO SOLN: 325.0000 mg | ORAL | Status: DC | PRN

## 2019-04-17 MED ORDER — LACTATED RINGERS IV SOLN: INTRAVENOUS | Status: DC

## 2019-04-17 MED ORDER — THYROID 30 MG PO TABS
45.0000 mg | ORAL_TABLET | Freq: Every day | ORAL | Status: DC
  Administered 2019-04-18 – 2019-04-20 (×3): 45 mg via ORAL
  Filled 2019-04-17 (×3): qty 2

## 2019-04-17 MED ORDER — KETAMINE HCL 10 MG/ML IJ SOLN
INTRAMUSCULAR | Status: DC | PRN
  Administered 2019-04-17: 30 mg via INTRAVENOUS

## 2019-04-17 MED ORDER — PROPOFOL 10 MG/ML IV BOLUS
INTRAVENOUS | Status: DC | PRN
  Administered 2019-04-17: 150 mg via INTRAVENOUS

## 2019-04-17 MED ORDER — ONDANSETRON HCL 4 MG/2ML IJ SOLN
INTRAMUSCULAR | Status: DC | PRN
  Administered 2019-04-17: 4 mg via INTRAVENOUS

## 2019-04-17 MED ORDER — ACETAMINOPHEN 500 MG PO TABS
1000.0000 mg | ORAL_TABLET | ORAL | Status: AC
  Administered 2019-04-17: 1000 mg via ORAL
  Filled 2019-04-17: qty 2

## 2019-04-17 MED ORDER — SUGAMMADEX SODIUM 200 MG/2ML IV SOLN
INTRAVENOUS | Status: DC | PRN
  Administered 2019-04-17: 200 mg via INTRAVENOUS

## 2019-04-17 MED ORDER — FENTANYL CITRATE (PF) 100 MCG/2ML IJ SOLN
INTRAMUSCULAR | Status: DC | PRN
  Administered 2019-04-17: 50 ug via INTRAVENOUS
  Administered 2019-04-17: 25 ug via INTRAVENOUS

## 2019-04-17 MED ORDER — ENSURE SURGERY PO LIQD
237.0000 mL | Freq: Two times a day (BID) | ORAL | Status: DC
  Administered 2019-04-18 (×2): 237 mL via ORAL
  Filled 2019-04-17 (×6): qty 237

## 2019-04-17 MED ORDER — HYDROMORPHONE HCL 1 MG/ML IJ SOLN
0.2500 mg | INTRAMUSCULAR | Status: DC | PRN
  Administered 2019-04-17 (×4): 0.5 mg via INTRAVENOUS

## 2019-04-17 MED ORDER — TRAMADOL HCL 50 MG PO TABS
50.0000 mg | ORAL_TABLET | Freq: Four times a day (QID) | ORAL | Status: DC | PRN
  Administered 2019-04-17 – 2019-04-20 (×6): 50 mg via ORAL
  Filled 2019-04-17 (×6): qty 1

## 2019-04-17 MED ORDER — CLINDAMYCIN PHOSPHATE 600 MG/50ML IV SOLN
600.0000 mg | Freq: Once | INTRAVENOUS | Status: AC
  Administered 2019-04-17: 600 mg via INTRAVENOUS
  Filled 2019-04-17: qty 50

## 2019-04-17 MED ORDER — ONDANSETRON HCL 4 MG PO TABS
4.0000 mg | ORAL_TABLET | Freq: Four times a day (QID) | ORAL | Status: DC | PRN
  Administered 2019-04-20: 4 mg via ORAL
  Filled 2019-04-17: qty 1

## 2019-04-17 MED ORDER — DEXAMETHASONE SODIUM PHOSPHATE 10 MG/ML IJ SOLN
INTRAMUSCULAR | Status: AC
  Filled 2019-04-17: qty 1

## 2019-04-17 MED ORDER — ROCURONIUM BROMIDE 10 MG/ML (PF) SYRINGE
PREFILLED_SYRINGE | INTRAVENOUS | Status: AC
  Filled 2019-04-17: qty 10

## 2019-04-17 SURGICAL SUPPLY — 52 items
APL PRP STRL LF DISP 70% ISPRP (MISCELLANEOUS) ×1
BLADE HEX COATED 2.75 (ELECTRODE) ×2 IMPLANT
CHLORAPREP W/TINT 26 (MISCELLANEOUS) ×2 IMPLANT
COVER MAYO STAND STRL (DRAPES) ×2 IMPLANT
COVER WAND RF STERILE (DRAPES) ×1 IMPLANT
DRAPE LAPAROSCOPIC ABDOMINAL (DRAPES) ×2 IMPLANT
DRAPE UTILITY XL STRL (DRAPES) IMPLANT
DRAPE WARM FLUID 44X44 (DRAPES) ×2 IMPLANT
DRSG OPSITE POSTOP 4X10 (GAUZE/BANDAGES/DRESSINGS) IMPLANT
DRSG OPSITE POSTOP 4X6 (GAUZE/BANDAGES/DRESSINGS) IMPLANT
DRSG OPSITE POSTOP 4X8 (GAUZE/BANDAGES/DRESSINGS) IMPLANT
DRSG TEGADERM 4X4.75 (GAUZE/BANDAGES/DRESSINGS) ×1 IMPLANT
DRSG TELFA 3X8 NADH (GAUZE/BANDAGES/DRESSINGS) ×2 IMPLANT
ELECT PENCIL ROCKER SW 15FT (MISCELLANEOUS) ×1 IMPLANT
ELECT REM PT RETURN 15FT ADLT (MISCELLANEOUS) ×2 IMPLANT
GAUZE SPONGE 4X4 12PLY STRL (GAUZE/BANDAGES/DRESSINGS) ×2 IMPLANT
GLOVE BIO SURGEON STRL SZ 6.5 (GLOVE) ×4 IMPLANT
GLOVE BIOGEL PI IND STRL 7.0 (GLOVE) ×1 IMPLANT
GLOVE BIOGEL PI INDICATOR 7.0 (GLOVE) ×1
GOWN STRL REUS W/TWL XL LVL3 (GOWN DISPOSABLE) ×4 IMPLANT
HANDLE SUCTION POOLE (INSTRUMENTS) ×1 IMPLANT
HOLDER FOLEY CATH W/STRAP (MISCELLANEOUS) IMPLANT
KIT BASIN OR (CUSTOM PROCEDURE TRAY) ×2 IMPLANT
KIT TURNOVER KIT A (KITS) IMPLANT
MANIFOLD NEPTUNE II (INSTRUMENTS) ×2 IMPLANT
NEEDLE HYPO 22GX1.5 SAFETY (NEEDLE) ×1 IMPLANT
PACK GENERAL/GYN (CUSTOM PROCEDURE TRAY) ×2 IMPLANT
PAD DRESSING TELFA 3X8 NADH (GAUZE/BANDAGES/DRESSINGS) IMPLANT
PENCIL SMOKE EVACUATOR (MISCELLANEOUS) IMPLANT
RELOAD PROXIMATE 75MM BLUE (ENDOMECHANICALS) ×4 IMPLANT
RELOAD STAPLE 75 3.8 BLU REG (ENDOMECHANICALS) IMPLANT
SPONGE LAP 18X18 RF (DISPOSABLE) IMPLANT
STAPLER GUN LINEAR PROX 60 (STAPLE) ×1 IMPLANT
STAPLER PROXIMATE 75MM BLUE (STAPLE) ×1 IMPLANT
SUCTION POOLE HANDLE (INSTRUMENTS) ×2
SUT NOVA NAB DX-16 0-1 5-0 T12 (SUTURE) IMPLANT
SUT NOVA NAB GS-21 0 18 T12 DT (SUTURE) ×5 IMPLANT
SUT PROLENE 2 0 BLUE (SUTURE) IMPLANT
SUT SILK 2 0 (SUTURE) ×2
SUT SILK 2 0 SH CR/8 (SUTURE) ×2 IMPLANT
SUT SILK 2-0 18XBRD TIE 12 (SUTURE) ×1 IMPLANT
SUT SILK 3 0 (SUTURE) ×2
SUT SILK 3 0 SH CR/8 (SUTURE) ×2 IMPLANT
SUT SILK 3-0 18XBRD TIE 12 (SUTURE) ×1 IMPLANT
SUT VIC AB 2-0 SH 18 (SUTURE) IMPLANT
SUT VIC AB 2-0 SH 27 (SUTURE) ×4
SUT VIC AB 2-0 SH 27X BRD (SUTURE) ×2 IMPLANT
SUT VICRYL 4-0 PS2 18IN ABS (SUTURE) ×2 IMPLANT
SYR 20ML LL LF (SYRINGE) ×1 IMPLANT
TOWEL OR 17X26 10 PK STRL BLUE (TOWEL DISPOSABLE) ×4 IMPLANT
TOWEL OR NON WOVEN STRL DISP B (DISPOSABLE) ×4 IMPLANT
YANKAUER SUCT BULB TIP NO VENT (SUCTIONS) ×2 IMPLANT

## 2019-04-17 NOTE — Anesthesia Postprocedure Evaluation (Signed)
Anesthesia Post Note  Patient: Samantha Reynolds  Procedure(s) Performed: LOOP ILEOSTOMY REVERSAL (N/A Abdomen)     Patient location during evaluation: PACU Anesthesia Type: General Level of consciousness: awake and alert Pain management: pain level controlled Vital Signs Assessment: post-procedure vital signs reviewed and stable Respiratory status: spontaneous breathing, nonlabored ventilation, respiratory function stable and patient connected to nasal cannula oxygen Cardiovascular status: blood pressure returned to baseline and stable Postop Assessment: no apparent nausea or vomiting Anesthetic complications: no    Last Vitals:  Vitals:   04/17/19 1617 04/17/19 1719  BP: 109/80 104/78  Pulse: 82 83  Resp: 18 18  Temp:  (!) 36.4 C  SpO2: 100% 100%    Last Pain:  Vitals:   04/17/19 1719  TempSrc: Oral  PainSc:                  Tigerlily Christine

## 2019-04-17 NOTE — Anesthesia Preprocedure Evaluation (Signed)
Anesthesia Evaluation  Patient identified by MRN, date of birth, ID band Patient awake    Reviewed: Allergy & Precautions, H&P , NPO status , Patient's Chart, lab work & pertinent test results  Airway Mallampati: II  TM Distance: >3 FB Neck ROM: Full    Dental no notable dental hx. (+) Teeth Intact, Dental Advisory Given   Pulmonary neg pulmonary ROS, former smoker,    Pulmonary exam normal breath sounds clear to auscultation       Cardiovascular negative cardio ROS   Rhythm:Regular Rate:Normal     Neuro/Psych Anxiety negative neurological ROS     GI/Hepatic Neg liver ROS, GERD  Controlled,  Endo/Other  Hypothyroidism   Renal/GU negative Renal ROS  negative genitourinary   Musculoskeletal   Abdominal   Peds  Hematology  (+) Blood dyscrasia, anemia ,   Anesthesia Other Findings   Reproductive/Obstetrics negative OB ROS                             Anesthesia Physical  Anesthesia Plan  ASA: II  Anesthesia Plan: General   Post-op Pain Management:    Induction: Intravenous  PONV Risk Score and Plan: 4 or greater and Ondansetron, Dexamethasone and Midazolam  Airway Management Planned: Oral ETT and LMA  Additional Equipment:   Intra-op Plan:   Post-operative Plan: Extubation in OR  Informed Consent: I have reviewed the patients History and Physical, chart, labs and discussed the procedure including the risks, benefits and alternatives for the proposed anesthesia with the patient or authorized representative who has indicated his/her understanding and acceptance.     Dental advisory given  Plan Discussed with: CRNA, Anesthesiologist and Surgeon  Anesthesia Plan Comments:         Anesthesia Quick Evaluation

## 2019-04-17 NOTE — Op Note (Signed)
04/17/2019  12:23 PM  PATIENT:  Samantha Reynolds  54 y.o. female  Patient Care Team: [name], MD as PCP - General (Family Medicine) Alfonzo Feller, RN as [place] Management  PRE-OPERATIVE DIAGNOSIS:  RECTAL CANCER, DIVERTING STOMA  POST-OPERATIVE DIAGNOSIS:  RECTAL CANCER, DIVERTING STOMA  PROCEDURE: LOOP ILEOSTOMY REVERSAL    Surgeon(s): Leighton Ruff, MD Clovis Riley, MD  ASSISTANT: Dr Kae Heller   ANESTHESIA:   local and general  EBL: 30 ml Total I/O In: 31 [IV Piggyback:50] Out: -   DRAINS: none   SPECIMEN:  Source of Specimen:  ileostomy  DISPOSITION OF SPECIMEN:  PATHOLOGY  COUNTS:  YES  PLAN OF CARE: Admit to inpatient   PATIENT DISPOSITION:  PACU - hemodynamically stable.  INDICATION: 54 y.o. F with rectal cancer.  Status post low anterior resection with diverting loop ileostomy.  She has had some difficulty with dehydration after surgery.  She is ready for ileostomy reversal.   OR FINDINGS: Densely adherent loop ileostomy  DESCRIPTION: the patient was identified in the preoperative holding area and taken to the OR where they were laid supine on the operating room table.  General anesthesia was induced without difficulty. SCDs were also noted to be in place prior to the initiation of anesthesia.  The patient was then prepped and draped in the usual sterile fashion.   A surgical timeout was performed indicating the correct patient, procedure, positioning and need for preoperative antibiotics.   I began by making an incision around the ostomy using electrocautery.  Dissection was carried down through the subcutaneous tissues using electrocautery and blunt dissection.  There was dense adhesive tissue noted laterally where her previous infection was located.  I began by mobilizing medially until I got down to the level of fascia.  I then continued around mobilized the lateral side.  At this point I was able to bluntly enter the  abdomen laterally and began to take down the intra-abdominal adhesions using blunt dissection.  I then carefully dissected the distal ileum away from the rectus using Metzenbaum scissors.  Once this was freed the proximal ileum was dissected away from the abdominal wall using Metzenbaum scissors and blunt dissection.  I was able to free the entire structure and bring it out of the abdomen.  The ileostomy was transected using a 75 mm GIA blue load x2.  An anastomosis was then created between the proximal and distal limb using another 75 mm GIA blue load stapler.  A TA blue load stapler was used to close the common enterotomy.  Imbricating sutures were used across the staple line using 3-0 silk suture.  The mesenteric defect was also closed using a 3-0 silk suture.  An antitension suture was placed at the base of the anastomosis.  This was then placed back into the abdomen and the omentum was placed over top of this.  There were no further adhesions noted to the abdominal wall.  The fascia was then closed using interrupted 0 Novafil sutures.  The subcutaneous tissue was reapproximated using a pursestring 2-0 Vicryl suture.  The dermis was also reapproximated using a 2-0 Vicryl pursestring suture.  A Telfa wick was placed in the middle of the wound and this was covered with a sterile dressing.  Patient was awakened from anesthesia and sent to the postanesthesia care unit in stable condition.  All counts were correct per operating room staff.

## 2019-04-17 NOTE — Anesthesia Procedure Notes (Signed)
Procedure Name: Intubation Date/Time: 04/17/2019 11:06 AM Performed by: West Pugh, CRNA Pre-anesthesia Checklist: Patient identified, Emergency Drugs available, Suction available, Patient being monitored and Timeout performed Patient Re-evaluated:Patient Re-evaluated prior to induction Oxygen Delivery Method: Circle system utilized Preoxygenation: Pre-oxygenation with 100% oxygen Induction Type: IV induction Ventilation: Mask ventilation without difficulty Laryngoscope Size: Mac and 3 Grade View: Grade I Tube type: Oral Tube size: 7.0 mm Number of attempts: 1 Airway Equipment and Method: Stylet Placement Confirmation: ETT inserted through vocal cords under direct vision,  positive ETCO2,  CO2 detector and breath sounds checked- equal and bilateral Secured at: 23 cm Tube secured with: Tape Dental Injury: Teeth and Oropharynx as per pre-operative assessment  Comments: SRNA Clarise Cruz) DL x 1 with Grade 1 view. AOI. CRNA and Anesthesiologist present throughout.

## 2019-04-17 NOTE — Transfer of Care (Signed)
Immediate Anesthesia Transfer of Care Note  Patient: Samantha Reynolds  Procedure(s) Performed: LOOP ILEOSTOMY REVERSAL (N/A Abdomen)  Patient Location: PACU  Anesthesia Type:General  Level of Consciousness: awake, alert  and patient cooperative  Airway & Oxygen Therapy: Patient Spontanous Breathing and Patient connected to face mask oxygen  Post-op Assessment: Report given to RN and Post -op Vital signs reviewed and stable  Post vital signs: Reviewed and stable  Last Vitals:  Vitals Value Taken Time  BP    Temp    Pulse 74 04/17/19 1239  Resp 17 04/17/19 1239  SpO2 100 % 04/17/19 1239  Vitals shown include unvalidated device data.  Last Pain:  Vitals:   04/17/19 1006  TempSrc:   PainSc: 0-No pain      Patients Stated Pain Goal: 3 ([id number])  Complications: No apparent anesthesia complications

## 2019-04-17 NOTE — H&P (Signed)
   54 year old female with stage 4 rectal cancer.  She underwent a robotic-assisted low anterior resection with diverting ileostomy on December 07, 2018. Her postoperative stay was complicated by ileostomy dysfunction requiring reoperative surgery on POD 4. After that, she recovered well. She was discharged home in stable condition on September 25. She continues to have problems with dehydration and is getting IV fluids on a weekly basis.   Problem List/Past Medical Leighton Ruff, MD; [id number] 4:55 PM) CHANGE OR REMOVAL OF DRAINS (Z48.03) RECTAL CANCER (C20)  Past Surgical History Leighton Ruff, MD; [id number] 4:55 PM) Gallbladder Surgery - Laparoscopic Oral Surgery Thyroid Surgery  Diagnostic Studies History Leighton Ruff, MD; [id number] 4:55 PM) Mammogram within last year  Allergies Leighton Ruff, MD; [id number] 4:55 PM) Penicillins Hives. Demerol *ANALGESICS - OPIOID* Itching.  Medication History (Armen Glo Herring, CMA; 03/04/2019 4:31 PM) ALPRAZolam (0.5MG  Tablet, Oral) Active. Armour Thyroid (30MG  Tablet, Oral) Active. Vitamin B 12 (50MCG Tablet, Oral) Active. Compazine (5MG  Tablet, Oral) Active. Tylenol (325MG  Tablet, Oral) Active. Potassium Chloride Crys ER (20MEQ Tablet ER, Oral) Active. Alaway (0.025% Solution, Ophthalmic) Active. Ibuprofen (200MG  Capsule, Oral) Active. Medications Reconciled  Social History Leighton Ruff, MD; [id number] 4:55 PM) Alcohol use Remotely quit alcohol use. Caffeine use Carbonated beverages, Tea. No drug use Tobacco use Former smoker.  Family History Leighton Ruff, MD; [id number] 4:55 PM) Ischemic Bowel Disease Father. Kidney Disease Mother.  Pregnancy / Birth History Leighton Ruff, MD; [id number] 4:55 PM) Age at menarche 25 years. Age of menopause 40-55 Gravida 5 Para 4  Other Problems Leighton Ruff, MD; [id number] 4:55 PM) Anxiety Disorder Cholelithiasis Gastroesophageal Reflux  Disease Hemorrhoids Thyroid Cancer    BP 129/85 (BP Location: Right Arm)   Pulse (!) 113   Resp 20   LMP 02/16/2015 (Approximate)   SpO2 100%      Physical Exam Leighton Ruff MD; [id number] 4:55 PM)  General Mental Status-Alert. General Appearance-Not in acute distress. Build & Nutrition-Well nourished. Posture-Normal posture. Gait-Normal.  Head and Neck Head-normocephalic, atraumatic with no lesions or palpable masses. Trachea-midline.  Chest and Lung Exam Chest and lung exam reveals -on auscultation, normal breath sounds, no adventitious sounds and normal vocal resonance.  Cardiovascular Cardiovascular examination reveals -normal heart sounds, regular rate and rhythm with no murmurs and no digital clubbing, cyanosis, edema, increased warmth or tenderness.  Abdomen Inspection Inspection of the abdomen reveals - No Hernias. Skin - Ileostomy - Right Lower Quadrant. Palpation/Percussion Palpation and Percussion of the abdomen reveal - Soft, Non Tender, No Rigidity (guarding), No hepatosplenomegaly and No Palpable abdominal masses.  Neurologic Neurologic evaluation reveals -alert and oriented x 3 with no impairment of recent or remote memory, normal attention span and ability to concentrate, normal sensation and normal coordination.  Musculoskeletal Normal Exam - Bilateral-Upper Extremity Strength Normal and Lower Extremity Strength Normal.    Assessment & Plan  RECTAL CANCER (C20) Impression: Patient status post low anterior resection with diverting ileostomy. She seems to be well healed. There is no sign of stricture. We had a long discussion today about ileostomy reversal and low anterior syndrome. All questions were answered. I think she would be a good candidate for reversal. She has good sphincter tone. She would like to have this done towards the end of January. Risks include bleeding, infection, hernia, and need for  other procedures and operations.

## 2019-04-17 NOTE — Progress Notes (Signed)
MD paged on patients behalf for her request to switch zofran to compazine for better nausea control. Awaiting new orders.

## 2019-04-18 LAB — CBC
HCT: 31.1 % — ABNORMAL LOW (ref 36.0–46.0)
Hemoglobin: 10.5 g/dL — ABNORMAL LOW (ref 12.0–15.0)
MCH: 34.1 pg — ABNORMAL HIGH (ref 26.0–34.0)
MCHC: 33.8 g/dL (ref 30.0–36.0)
MCV: 101 fL — ABNORMAL HIGH (ref 80.0–100.0)
Platelets: 110 10*3/uL — ABNORMAL LOW (ref 150–400)
RBC: 3.08 MIL/uL — ABNORMAL LOW (ref 3.87–5.11)
RDW: 14.6 % (ref 11.5–15.5)
WBC: 8.1 10*3/uL (ref 4.0–10.5)
nRBC: 0 % (ref 0.0–0.2)

## 2019-04-18 LAB — BASIC METABOLIC PANEL
Anion gap: 10 (ref 5–15)
BUN: 11 mg/dL (ref 6–20)
CO2: 26 mmol/L (ref 22–32)
Calcium: 9.1 mg/dL (ref 8.9–10.3)
Chloride: 101 mmol/L (ref 98–111)
Creatinine, Ser: 0.87 mg/dL (ref 0.44–1.00)
GFR calc Af Amer: >60 mL/min (ref >60)
GFR calc non Af Amer: >60 mL/min (ref >60)
Glucose, Bld: 110 mg/dL — ABNORMAL HIGH (ref 70–99)
Potassium: 4.2 mmol/L (ref 3.5–5.1)
Sodium: 137 mmol/L (ref 135–145)

## 2019-04-18 NOTE — Progress Notes (Signed)
Pharmacy Brief Note - Alvimopan (Entereg)  The standing order set for alvimopan (Entereg) now includes an automatic order to discontinue the drug after the patient has had a bowel movement. The change was approved by the [place] and the Medical Executive Committee.   This patient has had bowel movements documented by nursing. Therefore, alvimopan has been discontinued. If there are questions, please contact the pharmacy at [phone].   Thank you- Dorrene German 04/18/2019 10:13 PM

## 2019-04-18 NOTE — Progress Notes (Signed)
1 Day Post-Op ileostomy reversal Subjective: No acute issues overnight.  No nausea.  Tolerating clears.  No flatus.  Ambulated in hall.  Pain ok with Tramadol  Objective: Vital signs in last 24 hours: Temp:  [97.3 F (36.3 C)-98.7 F (37.1 C)] 97.7 F (36.5 C) (01/21 0517) Pulse Rate:  [68-113] 90 (01/21 0517) Resp:  [12-20] 16 (01/21 0517) BP: (102-136)/(70-93) 106/70 (01/21 0517) SpO2:  [99 %-100 %] 99 % (01/21 0517) Weight:  [61.2 kg] 61.2 kg (01/20 1006)   Intake/Output from previous day: 01/20 0701 - 01/21 0700 In: 1246.4 [P.O.:60; I.V.:1136.4; IV Piggyback:50] Out: 750 [Urine:750] Intake/Output this shift: No intake/output data recorded.   General appearance: alert and cooperative GI: normal findings: soft, non-distended  Incision: no significant drainage  Lab Results:  Recent Labs    04/18/19 0400  WBC 8.1  HGB 10.5*  HCT 31.1*  PLT 110*   BMET Recent Labs    04/18/19 0400  NA 137  K 4.2  CL 101  CO2 26  GLUCOSE 110*  BUN 11  CREATININE 0.87  CALCIUM 9.1   PT/INR No results for input(s): LABPROT, INR in the last 72 hours. ABG No results for input(s): PHART, HCO3 in the last 72 hours.  Invalid input(s): PCO2, PO2  MEDS, Scheduled . ALPRAZolam  0.5 mg Oral TID  . alvimopan  12 mg Oral BID  . enoxaparin (LOVENOX) injection  40 mg Subcutaneous Q24H  . feeding supplement  237 mL Oral BID BM  . gabapentin  300 mg Oral BID  . saccharomyces boulardii  250 mg Oral BID  . thyroid  45 mg Oral Q0600    Studies/Results: No results found.  Assessment: s/p Procedure(s): LOOP ILEOSTOMY REVERSAL Patient Active Problem List   Diagnosis Date Noted  . 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 High output ileostomy ([place]) 12/14/2018  . H/O Hashimoto thyroiditis   . Hypothyroidism   . Rectal cancer metastasized to liver ([place]) 10/04/2018  . Hyperbilirubinemia 04/24/2018  . Abnormal EKG 03/28/2018  . Severe sepsis ([place]) 03/28/2018  .  Sepsis due to undetermined organism ([place]) 03/27/2018  . GERD (gastroesophageal reflux disease) 03/27/2018  . Anxiety 03/27/2018  . Lactic acidosis 03/27/2018  . Hypokalemia 03/27/2018  . Antineoplastic chemotherapy induced pancytopenia ([place]) 03/27/2018  . Hyperglycemia 03/27/2018  . 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[place]) 02/26/2018  . Hematochezia 01/24/2018  . 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 Hyperlipidemia 11/09/2015  . Sciatica of right side 06/26/2012  . History of thyroid cancer 04/02/2012  . 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 Pain in joint, ankle and foot 03/09/2011    Expected post op course  Plan: Advance diet Cont ambulation  Full liquids as tolerated   LOS: 1 day     .Rosario Adie, MD [facility], [place]    04/18/2019 7:42 AM

## 2019-04-19 LAB — BASIC METABOLIC PANEL
Anion gap: 7 (ref 5–15)
BUN: 13 mg/dL (ref 6–20)
CO2: 28 mmol/L (ref 22–32)
Calcium: 8.6 mg/dL — ABNORMAL LOW (ref 8.9–10.3)
Chloride: 100 mmol/L (ref 98–111)
Creatinine, Ser: 0.78 mg/dL (ref 0.44–1.00)
GFR calc Af Amer: >60 mL/min (ref >60)
GFR calc non Af Amer: >60 mL/min (ref >60)
Glucose, Bld: 113 mg/dL — ABNORMAL HIGH (ref 70–99)
Potassium: 3.5 mmol/L (ref 3.5–5.1)
Sodium: 135 mmol/L (ref 135–145)

## 2019-04-19 LAB — CBC
HCT: 28.1 % — ABNORMAL LOW (ref 36.0–46.0)
Hemoglobin: 9.6 g/dL — ABNORMAL LOW (ref 12.0–15.0)
MCH: 34.5 pg — ABNORMAL HIGH (ref 26.0–34.0)
MCHC: 34.2 g/dL (ref 30.0–36.0)
MCV: 101.1 fL — ABNORMAL HIGH (ref 80.0–100.0)
Platelets: 87 10*3/uL — ABNORMAL LOW (ref 150–400)
RBC: 2.78 MIL/uL — ABNORMAL LOW (ref 3.87–5.11)
RDW: 14.9 % (ref 11.5–15.5)
WBC: 5.7 10*3/uL (ref 4.0–10.5)
nRBC: 0 % (ref 0.0–0.2)

## 2019-04-19 NOTE — Progress Notes (Signed)
2 Days Post-Op ileostomy reversal Subjective: No acute issues overnight.  No nausea.  Tolerating fulls.  Having bowel function.  Ambulated in hall.  Pain ok with Tramadol  Objective: Vital signs in last 24 hours: Temp:  [99 F (37.2 C)-99.4 F (37.4 C)] 99.4 F (37.4 C) (01/21 2147) Pulse Rate:  [96-106] 106 (01/21 2147) Resp:  [16-18] 16 (01/21 2147) BP: (101-108)/(74-75) 108/74 (01/21 2147) SpO2:  [100 %] 100 % (01/21 2147) Weight:  [64.2 kg] 64.2 kg (01/22 [id number])   Intake/Output from previous day: 01/21 0701 - 01/22 0700 In: 954 [P.O.:954] Out: 1450 [Urine:1450] Intake/Output this shift: No intake/output data recorded.   General appearance: alert and cooperative GI: normal findings: soft, non-distended  Incision: no significant drainage  Lab Results:  Recent Labs    04/18/19 0400 04/19/19 0412  WBC 8.1 5.7  HGB 10.5* 9.6*  HCT 31.1* 28.1*  PLT 110* 87*   BMET Recent Labs    04/18/19 0400 04/19/19 0412  NA 137 135  K 4.2 3.5  CL 101 100  CO2 26 28  GLUCOSE 110* 113*  BUN 11 13  CREATININE 0.87 0.78  CALCIUM 9.1 8.6*   PT/INR No results for input(s): LABPROT, INR in the last 72 hours. ABG No results for input(s): PHART, HCO3 in the last 72 hours.  Invalid input(s): PCO2, PO2  MEDS, Scheduled . ALPRAZolam  0.5 mg Oral TID  . enoxaparin (LOVENOX) injection  40 mg Subcutaneous Q24H  . feeding supplement  237 mL Oral BID BM  . gabapentin  300 mg Oral BID  . saccharomyces boulardii  250 mg Oral BID  . thyroid  45 mg Oral Q0600    Studies/Results: No results found.  Assessment: s/p Procedure(s): LOOP ILEOSTOMY REVERSAL Patient Active Problem List   Diagnosis Date Noted  . 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 H/O Hashimoto thyroiditis   . Hypothyroidism   . Rectal cancer metastasized to liver ([place]) 10/04/2018  . Hyperbilirubinemia 04/24/2018  . Abnormal EKG 03/28/2018  . Severe  sepsis ([place]) 03/28/2018  . Sepsis due to undetermined organism ([place]) 03/27/2018  . GERD (gastroesophageal reflux disease) 03/27/2018  . Anxiety 03/27/2018  . Lactic acidosis 03/27/2018  . Hypokalemia 03/27/2018  . Antineoplastic chemotherapy induced pancytopenia ([place]) 03/27/2018  . Hyperglycemia 03/27/2018  . 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[place]) 02/26/2018  . Hematochezia 01/24/2018  . 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 Hyperlipidemia 11/09/2015  . Sciatica of right side 06/26/2012  . History of thyroid cancer 04/02/2012  . 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 Pain in joint, ankle and foot 03/09/2011    Expected post op course  Plan: Advance diet Cont ambulation  Soft diet Possible d/c tom   LOS: 2 days     .Rosario Adie, MD [facility], [place]    04/19/2019 8:10 AM

## 2019-04-20 LAB — CBC
HCT: 27.4 % — ABNORMAL LOW (ref 36.0–46.0)
Hemoglobin: 9.5 g/dL — ABNORMAL LOW (ref 12.0–15.0)
MCH: 34.8 pg — ABNORMAL HIGH (ref 26.0–34.0)
MCHC: 34.7 g/dL (ref 30.0–36.0)
MCV: 100.4 fL — ABNORMAL HIGH (ref 80.0–100.0)
Platelets: 94 10*3/uL — ABNORMAL LOW (ref 150–400)
RBC: 2.73 MIL/uL — ABNORMAL LOW (ref 3.87–5.11)
RDW: 14.5 % (ref 11.5–15.5)
WBC: 5 10*3/uL (ref 4.0–10.5)
nRBC: 0 % (ref 0.0–0.2)

## 2019-04-20 LAB — BASIC METABOLIC PANEL
Anion gap: 9 (ref 5–15)
BUN: 11 mg/dL (ref 6–20)
CO2: 28 mmol/L (ref 22–32)
Calcium: 8.7 mg/dL — ABNORMAL LOW (ref 8.9–10.3)
Chloride: 98 mmol/L (ref 98–111)
Creatinine, Ser: 0.82 mg/dL (ref 0.44–1.00)
GFR calc Af Amer: >60 mL/min (ref >60)
GFR calc non Af Amer: >60 mL/min (ref >60)
Glucose, Bld: 96 mg/dL (ref 70–99)
Potassium: 3.4 mmol/L — ABNORMAL LOW (ref 3.5–5.1)
Sodium: 135 mmol/L (ref 135–145)

## 2019-04-20 MED ORDER — LOPERAMIDE HCL 2 MG PO CAPS: 2.0000 mg | ORAL_CAPSULE | ORAL | Status: DC | PRN

## 2019-04-20 MED ORDER — TRAMADOL HCL 50 MG PO TABS: 50.0000 mg | ORAL_TABLET | Freq: Four times a day (QID) | ORAL | 0 refills | Status: DC | PRN

## 2019-04-20 MED FILL — traMADol HCL 50 MG TABS: 50 | 8 days supply | Qty: 30 | Fill #0

## 2019-04-20 NOTE — Discharge Summary (Signed)
Physician Discharge Summary  Patient ID: Samantha Reynolds MRN: [id number] DOB/AGE: 01-Jun-1965 54 y.o.  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  Her diet was advanced as tolerated.  Bowel function returned by POD 2.  She was felt to be in good condition for d/c on POD 3.    Consults: None  Significant Diagnostic Studies: labs: cbc, bmet  Treatments: IV hydration, analgesia: tramadol and surgery: ileostomy reversal  Discharge Exam: Blood pressure 97/68, pulse 94, temperature 98.7 F (37.1 C), temperature source Oral, resp. rate 14, height 5\' 6"  (1.676 m), weight 61.2 kg, last menstrual period 02/16/2015, SpO2 98 %. General appearance: alert and cooperative GI: normal findings: soft, non-tender Incision/Wound: healing appropriately   Disposition: Discharge disposition: 01-Home or Self Care        Allergies as of 04/20/2019      Reactions   Demerol [meperidine] Itching   All over the body.   Penicillins Hives    childhood, does not remember if it spread all over the body or not. Did it involve swelling of the face/tongue/throat, SOB, or low BP? No Did it involve sudden or severe rash/hives, skin peeling, or any reaction on the inside of your mouth or nose? Yes Did you need to seek medical attention at a hospital or doctor's office? Yes When did it last happen?childhood allergy If all above answers are "NO", may proceed with cephalosporin use.   Other Hives, Other (See Comments)   Fresh coconut    Vancomycin Rash   Will need Benadryl prior to administration per IV. "Red man syndrome"      Medication List    TAKE these medications   ALPRAZolam 0.5 MG tablet Commonly known as: Xanax Take one tablet tid What changed:   how much to  take  how to take this  when to take this  additional instructions   fluticasone 0.05 % cream Commonly known as: CUTIVATE Apply 1 application topically daily as needed (irritatiton).   lidocaine-prilocaine cream Commonly known as: EMLA Apply small amount to port site one hour prior to appointment and cover with plastic wrap. What changed:   how much to take  how to take this  when to take this  reasons to take this  additional instructions   loperamide 2 MG capsule Commonly known as: IMODIUM Take 1 capsule (2 mg total) by mouth 3 (three) times daily.   ondansetron 4 MG tablet Commonly known as: ZOFRAN Take 1 tablet (4 mg total) by mouth every 8 (eight) hours as needed for nausea or vomiting.   prochlorperazine 10 MG tablet Commonly known as: COMPAZINE Take 10 mg by mouth every 6 (six) hours as needed for nausea or vomiting.   thyroid 15 MG tablet Commonly known as: Armour Thyroid Take 1 tablet (15 mg total) by mouth daily. Take one 15mg  tablet along with one 30 mg tablet What changed: additional instructions   thyroid 30 MG tablet Commonly known as: Armour Thyroid Take 1 tablet (30 mg total) by mouth daily before breakfast. Take one 30mg  tablet along with a 15mg  tablet daily What changed: additional instructions   traMADol 50 MG tablet Commonly known as: ULTRAM Take 1 tablet (50 mg total) by mouth every 6 (six) hours as needed (mild pain).   vitamin B-12 1000 MCG tablet Commonly  known as: CYANOCOBALAMIN Take 1,000 mcg by mouth daily.        Signed: Rosario Adie [id number], 8:11 AM

## 2019-04-20 NOTE — Discharge Instructions (Signed)
ABDOMINAL SURGERY: POST OP INSTRUCTIONS  1. DIET: Follow a light bland diet the first 24 hours after arrival home, such as soup, liquids, crackers, etc.  Be sure to include lots of fluids daily.  Avoid fast food or heavy meals as your are more likely to get nauseated.  Do not eat any uncooked fruits or vegetables for the next 2 weeks as your colon heals. 2. Take your usually prescribed home medications unless otherwise directed. 3. PAIN CONTROL: a. Pain is best controlled by a usual combination of three different methods TOGETHER: i. Ice/Heat ii. Over the counter pain medication iii. Prescription pain medication b. Most patients will experience some swelling and bruising around the incisions.  Ice packs or heating pads (30-60 minutes up to 6 times a day) will help. Use ice for the first few days to help decrease swelling and bruising, then switch to heat to help relax tight/sore spots and speed recovery.  Some people prefer to use ice alone, heat alone, alternating between ice & heat.  Experiment to what works for you.  Swelling and bruising can take several weeks to resolve.   c. It is helpful to take an over-the-counter pain medication regularly for the first few weeks.  Choose one of the following that works best for you: i. Naproxen (Aleve, etc)  Two 220mg  tabs twice a day ii. Ibuprofen (Advil, etc) Three 200mg  tabs four times a day (every meal & bedtime) iii. Acetaminophen (Tylenol, etc) 500-650mg  four times a day (every meal & bedtime) d. A  prescription for pain medication (such as oxycodone, hydrocodone, etc) should be given to you upon discharge.  Take your pain medication as prescribed.  i. If you are having problems/concerns with the prescription medicine (does not control pain, nausea, vomiting, rash, itching, etc), please call us [phone] to see if we need to switch you to a different pain medicine that will work better for you and/or control your side effect better. ii. If you  need a refill on your pain medication, please contact your pharmacy.  They will contact our office to request authorization. Prescriptions will not be filled after 5 pm or on week-ends. 4. Avoid getting constipated.  Between the surgery and the pain medications, it is common to experience some constipation.  Increasing fluid intake and taking a fiber supplement (such as Metamucil, Citrucel, FiberCon, MiraLax, etc) 1-2 times a day regularly will usually help prevent this problem from occurring.  A mild laxative (prune juice, Milk of Magnesia, MiraLax, etc) should be taken according to package directions if there are no bowel movements after 48 hours.   5. Watch out for diarrhea.  If you have many loose bowel movements, simplify your diet to bland foods & liquids for a few days.  Stop any stool softeners and decrease your fiber supplement.  Switching to mild anti-diarrheal medications (Kayopectate, Pepto Bismol) can help.  If this worsens or does not improve, please call us. 6. Wash / shower every day.  You may shower over the incision / wound.  Avoid baths until the skin is fully healed.  Continue to shower over incision(s) after the dressing is off. 7. Change your dressing daily and as needed.  You may shower with dressing off.  Rinse with soapy water and pat dry 8. ACTIVITIES as tolerated:   a. You may resume regular (light) daily activities beginning the next day--such as daily self-care, walking, climbing stairs--gradually increasing activities as tolerated.  If you can walk 30 minutes without difficulty,  it is safe to try more intense activity such as jogging, treadmill, bicycling, low-impact aerobics, swimming, etc. b. Save the most intensive and strenuous activity for last such as sit-ups, heavy lifting, contact sports, etc  Refrain from any heavy lifting or straining until you are off narcotics for pain control.   c. DO NOT PUSH THROUGH PAIN.  Let pain be your guide: If it hurts to do something, don't  do it.  Pain is your body warning you to avoid that activity for another week until the pain goes down. d. You may drive when you are no longer taking prescription pain medication, you can comfortably wear a seatbelt, and you can safely maneuver your car and apply brakes. e. Dennis Bast may have sexual intercourse when it is comfortable.  9. FOLLOW UP in our office a. Please call [facility] at [phone] to set up an appointment to see your surgeon in the office for a follow-up appointment approximately 1-2 weeks after your surgery. b. Make sure that you call for this appointment the day you arrive home to insure a convenient appointment time. 10. IF YOU HAVE DISABILITY OR FAMILY LEAVE FORMS, BRING THEM TO THE OFFICE FOR PROCESSING.  DO NOT GIVE THEM TO YOUR DOCTOR.   WHEN TO CALL us [phone]: 1. Poor pain control 2. Reactions / problems with new medications (rash/itching, nausea, etc)  3. Fever over 101.5 F (38.5 C) 4. Inability to urinate 5. Nausea and/or vomiting 6. Worsening swelling or bruising 7. Continued bleeding from incision. 8. Increased pain, redness, or drainage from the incision  The clinic staff is available to answer your questions during regular business hours (8:30am-5pm).  Please dont hesitate to call and ask to speak to one of our nurses for clinical concerns.   A surgeon from [facility] is always on call at the hospitals   If you have a medical emergency, go to the nearest emergency room or call 911.    [facility], [place], [place], [place], [place]  [id number] ? MAIN: [phone] ? TOLL FREE: [phone] ? FAX [phone] [email or web address]

## 2019-04-22 ENCOUNTER — Other Ambulatory Visit: Payer: Self-pay | Admitting: *Deleted

## 2019-04-22 ENCOUNTER — Encounter: Payer: Self-pay | Admitting: *Deleted

## 2019-04-22 ENCOUNTER — Ambulatory Visit (HOSPITAL_COMMUNITY): Payer: 59

## 2019-04-22 LAB — SURGICAL PATHOLOGY

## 2019-04-22 NOTE — Patient Outreach (Addendum)
[facility]) Care Management  04/22/2019  Samantha Reynolds 10/02/1965 [id number]   Transition of care call/case closure   Referral received: 04/08/19 Initial outreach: 04/22/19 Insurance: [employer] Save Plan   Subjective: Initial successful telephone call to patient's preferred number in order to complete transition of care assessment; 2 HIPAA identifiers verified. Explained purpose of call and completed transition of care assessment.  Samantha Reynolds states she is doing OK except for some loose stools and fatigue.  She says she is treating the loose stools with 1/2 tablet of Imodium every 12 hours. She says surgical incisions are unremarkable, and that her husband Samantha Reynolds, an Therapist, sports, changes the dressing daily, she states surgical pain well managed with prescribed medications- she is taking Tramadol every 8 hours, tolerating soft diet- say she is primarily eating stewed potatoes, toast, bananas and yogurt.  Denies  bladder problems. Spouse Samantha Reynolds and son Samantha Reynolds and her Mom are assisting with her recovery.  When reviewing upcoming appointments with Samantha Reynolds, she said she is seeing Dr. Aline Reynolds for a frozen left shoulder that occurred suddenly around Christmas. Samantha Reynolds says she received 2 steroid injections and was attending outpatient physical therapy prior to her ostomy surgery. She says she has been doing a home exercise program provided by the physical therapist at the outpatient rehabilitation center and that her shoulder is much better.  She denies any ongoing health issues and says she does not need a referral to one of the [place] chronic disease management programs.  She says she does have the hospital indemnity and would appreciate the [employer] contact number so she can file the claim.  She says she uses a [facility] outpatient pharmacy.  She denies educational needs related to staying safe during the COVID 19 pandemic. She reports she had the virus in August 2020 when it was discovered on pre-op  testing. She denies long term effects of the virus. She says she spoke with her oncologist about taking the Covid vaccine and she was advised to wait until she has fully recovered from her ostomy reversal surgery. She says she has surveillance CT of her abdomen and pelvis every 3 months and then follow up with her oncologist to discuss results.    Objective:  Samantha Reynolds was hospitalized at [facility] from 1/20-1/23/21 for loop ostomy reversal. 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 She was discharged to home on 04/20/19 without the need for home health services or DME.   Assessment:  Patient voices good understanding of all discharge instructions.  See transition of care flowsheet for assessment details.   Plan:  Reviewed hospital discharge diagnosis of loop ileostomy reversal and discharge treatment plan using hospital discharge instructions, assessing medication adherence, reviewing problems requiring provider notification, and discussing the importance of follow up with surgeon and specialists as directed. No ongoing care management needs identified so will close case to [facility] [facility] services and route successful outreach letter with [place] Management pamphlet and 24 Hour Nurse Line Magnet to [place] [facility] clinical pool to be mailed to patient's home address.  Thanked patient for her services to [facility] as a OR.short stay scheduler at [place] [facility] Coordinator Office Phone [phone] Office Fax [phone]

## 2019-04-25 ENCOUNTER — Encounter (HOSPITAL_COMMUNITY)
Admission: RE | Admit: 2019-04-25 | Discharge: 2019-04-25 | Disposition: A | Discharge status: Home / Self Care | Payer: 59 | Source: Ambulatory Visit | Attending: Hematology | Admitting: Hematology

## 2019-04-25 ENCOUNTER — Other Ambulatory Visit: Payer: Self-pay

## 2019-04-25 ENCOUNTER — Ambulatory Visit (HOSPITAL_COMMUNITY): Payer: 59 | Admitting: Hematology

## 2019-04-25 DIAGNOSIS — Z452 Encounter for adjustment and management of vascular access device: Secondary | ICD-10-CM | POA: Diagnosis not present

## 2019-04-25 DIAGNOSIS — C20 Malignant neoplasm of rectum: Secondary | ICD-10-CM | POA: Insufficient documentation

## 2019-04-25 MED ORDER — SODIUM CHLORIDE 0.9 % IV SOLN: Freq: Once | INTRAVENOUS | Status: AC

## 2019-04-25 MED ORDER — HEPARIN SOD (PORK) LOCK FLUSH 100 UNIT/ML IV SOLN
500.0000 [IU] | Freq: Once | INTRAVENOUS | Status: AC
  Administered 2019-04-25: 500 [IU] via INTRAVENOUS

## 2019-04-26 MED FILL — ARMOUR THYROID 15 MG TABLET: 15 | 30 days supply | Qty: 30 | Fill #1

## 2019-04-26 MED FILL — ARMOUR THYROID 30 MG TABLET: 30 | 30 days supply | Qty: 30 | Fill #1

## 2019-05-03 ENCOUNTER — Encounter (HOSPITAL_COMMUNITY)
Admission: RE | Admit: 2019-05-03 | Discharge: 2019-05-03 | Disposition: A | Discharge status: Home / Self Care | Payer: 59 | Source: Ambulatory Visit | Attending: Hematology | Admitting: Hematology

## 2019-05-03 ENCOUNTER — Other Ambulatory Visit: Payer: Self-pay

## 2019-05-03 ENCOUNTER — Encounter (HOSPITAL_COMMUNITY): Payer: Self-pay

## 2019-05-03 DIAGNOSIS — C20 Malignant neoplasm of rectum: Secondary | ICD-10-CM | POA: Diagnosis not present

## 2019-05-03 MED ORDER — SODIUM CHLORIDE 0.9 % IV SOLN
INTRAVENOUS | Status: AC
  Administered 2019-05-03: 1000 mL via INTRAVENOUS

## 2019-05-03 MED ORDER — HEPARIN SOD (PORK) LOCK FLUSH 100 UNIT/ML IV SOLN
500.0000 [IU] | Freq: Once | INTRAVENOUS | Status: AC
  Administered 2019-05-03: 500 [IU] via INTRAVENOUS

## 2019-05-04 ENCOUNTER — Encounter (INDEPENDENT_AMBULATORY_CARE_PROVIDER_SITE_OTHER): Payer: Self-pay | Admitting: Internal Medicine

## 2019-05-08 DIAGNOSIS — E063 Autoimmune thyroiditis: Secondary | ICD-10-CM | POA: Diagnosis not present

## 2019-05-08 DIAGNOSIS — C20 Malignant neoplasm of rectum: Secondary | ICD-10-CM | POA: Diagnosis not present

## 2019-05-08 DIAGNOSIS — E890 Postprocedural hypothyroidism: Secondary | ICD-10-CM | POA: Diagnosis not present

## 2019-05-08 DIAGNOSIS — C73 Malignant neoplasm of thyroid gland: Secondary | ICD-10-CM | POA: Diagnosis not present

## 2019-05-08 DIAGNOSIS — D511 Vitamin B12 deficiency anemia due to selective vitamin B12 malabsorption with proteinuria: Secondary | ICD-10-CM | POA: Diagnosis not present

## 2019-05-09 ENCOUNTER — Encounter (HOSPITAL_COMMUNITY): Payer: Self-pay

## 2019-05-09 ENCOUNTER — Other Ambulatory Visit: Payer: Self-pay

## 2019-05-09 ENCOUNTER — Encounter (HOSPITAL_COMMUNITY)
Admission: RE | Admit: 2019-05-09 | Discharge: 2019-05-09 | Disposition: A | Discharge status: Home / Self Care | Payer: 59 | Source: Ambulatory Visit | Attending: Hematology | Admitting: Hematology

## 2019-05-09 DIAGNOSIS — C20 Malignant neoplasm of rectum: Secondary | ICD-10-CM | POA: Diagnosis not present

## 2019-05-09 MED ORDER — SODIUM CHLORIDE 0.9 % IV SOLN: Freq: Once | INTRAVENOUS | Status: AC

## 2019-05-09 MED ORDER — HEPARIN SOD (PORK) LOCK FLUSH 100 UNIT/ML IV SOLN
500.0000 [IU] | Freq: Once | INTRAVENOUS | Status: AC
  Administered 2019-05-09: 500 [IU] via INTRAVENOUS

## 2019-05-14 ENCOUNTER — Other Ambulatory Visit: Payer: Self-pay

## 2019-05-14 ENCOUNTER — Encounter (HOSPITAL_COMMUNITY)
Admission: RE | Admit: 2019-05-14 | Discharge: 2019-05-14 | Disposition: A | Discharge status: Home / Self Care | Payer: 59 | Source: Ambulatory Visit | Attending: Hematology | Admitting: Hematology

## 2019-05-14 ENCOUNTER — Encounter (HOSPITAL_COMMUNITY): Payer: Self-pay

## 2019-05-14 DIAGNOSIS — C20 Malignant neoplasm of rectum: Secondary | ICD-10-CM | POA: Diagnosis not present

## 2019-05-14 MED ORDER — HEPARIN SOD (PORK) LOCK FLUSH 100 UNIT/ML IV SOLN
500.0000 [IU] | Freq: Once | INTRAVENOUS | Status: AC
  Administered 2019-05-14: 500 [IU] via INTRAVENOUS

## 2019-05-14 MED ORDER — SODIUM CHLORIDE 0.9 % IV SOLN: Freq: Once | INTRAVENOUS | Status: AC

## 2019-05-20 ENCOUNTER — Encounter (HOSPITAL_COMMUNITY)
Admission: RE | Admit: 2019-05-20 | Discharge: 2019-05-20 | Disposition: A | Discharge status: Home / Self Care | Payer: 59 | Source: Ambulatory Visit | Attending: Hematology | Admitting: Hematology

## 2019-05-20 ENCOUNTER — Other Ambulatory Visit: Payer: Self-pay

## 2019-05-20 DIAGNOSIS — C20 Malignant neoplasm of rectum: Secondary | ICD-10-CM | POA: Diagnosis not present

## 2019-05-20 MED ORDER — HEPARIN SOD (PORK) LOCK FLUSH 100 UNIT/ML IV SOLN
500.0000 [IU] | Freq: Once | INTRAVENOUS | Status: AC
  Administered 2019-05-20: 500 [IU] via INTRAVENOUS

## 2019-05-20 MED ORDER — SODIUM CHLORIDE 0.9 % IV SOLN: Freq: Once | INTRAVENOUS | Status: AC

## 2019-05-21 DIAGNOSIS — E011 Iodine-deficiency related multinodular (endemic) goiter: Secondary | ICD-10-CM | POA: Diagnosis not present

## 2019-05-21 DIAGNOSIS — E860 Dehydration: Secondary | ICD-10-CM | POA: Diagnosis not present

## 2019-05-21 DIAGNOSIS — C73 Malignant neoplasm of thyroid gland: Secondary | ICD-10-CM | POA: Diagnosis not present

## 2019-05-21 DIAGNOSIS — C20 Malignant neoplasm of rectum: Secondary | ICD-10-CM | POA: Diagnosis not present

## 2019-05-21 DIAGNOSIS — D511 Vitamin B12 deficiency anemia due to selective vitamin B12 malabsorption with proteinuria: Secondary | ICD-10-CM | POA: Diagnosis not present

## 2019-05-21 DIAGNOSIS — E063 Autoimmune thyroiditis: Secondary | ICD-10-CM | POA: Diagnosis not present

## 2019-05-21 DIAGNOSIS — E890 Postprocedural hypothyroidism: Secondary | ICD-10-CM | POA: Diagnosis not present

## 2019-05-21 MED FILL — ARMOUR THYROID 60 MG TABLET: 60 | 30 days supply | Qty: 30 | Fill #0

## 2019-05-22 ENCOUNTER — Ambulatory Visit: Payer: 59 | Admitting: Orthopedic Surgery

## 2019-05-22 MED FILL — ALPRAZolam 0.5 MG TABS: 0.5 | 30 days supply | Qty: 90 | Fill #1

## 2019-06-20 ENCOUNTER — Other Ambulatory Visit: Payer: Self-pay | Admitting: Family Medicine

## 2019-06-20 ENCOUNTER — Encounter: Payer: Self-pay | Admitting: Family Medicine

## 2019-06-20 MED ORDER — ALPRAZOLAM 0.5 MG PO TABS: ORAL_TABLET | ORAL | 0 refills | Status: DC

## 2019-06-20 NOTE — Telephone Encounter (Signed)
Nurses I did send in a refill to [facility] for her Xanax Please forward message to the patient for her to do a follow-up virtual or in person within 30 days regarding medications-given Xanax is a controlled medicine I do have to show that she is being treated/monitored appropriately at least every 6 months on this-it has been a while so therefore virtual or in person follow-up thank you

## 2019-06-21 ENCOUNTER — Other Ambulatory Visit (INDEPENDENT_AMBULATORY_CARE_PROVIDER_SITE_OTHER): Payer: Self-pay | Admitting: Internal Medicine

## 2019-06-21 MED ORDER — DICYCLOMINE HCL 10 MG PO CAPS: 10.0000 mg | ORAL_CAPSULE | Freq: Three times a day (TID) | ORAL | 5 refills | Status: DC

## 2019-06-21 MED FILL — ARMOUR THYROID 60 MG TABLET: 60 | 30 days supply | Qty: 30 | Fill #1

## 2019-06-21 MED FILL — DICYCLOMINE 10 MG CAPSULE: 10 | 30 days supply | Qty: 90 | Fill #0

## 2019-06-21 MED FILL — ALPRAZolam 0.5 MG TABS: 0.5 | 30 days supply | Qty: 90 | Fill #0

## 2019-06-21 NOTE — Telephone Encounter (Signed)
Prescription for dicyclomine 10 mg before each meal sent to patient's pharmacy. 90 doses with 5 refills.

## 2019-07-10 ENCOUNTER — Ambulatory Visit (HOSPITAL_COMMUNITY)
Admission: RE | Admit: 2019-07-10 | Discharge: 2019-07-10 | Disposition: A | Discharge status: Home / Self Care | Payer: 59 | Source: Ambulatory Visit | Attending: Hematology | Admitting: Hematology

## 2019-07-10 ENCOUNTER — Inpatient Hospital Stay (HOSPITAL_COMMUNITY): Payer: 59

## 2019-07-10 ENCOUNTER — Encounter (HOSPITAL_COMMUNITY)
Admission: RE | Admit: 2019-07-10 | Discharge: 2019-07-10 | Disposition: A | Discharge status: Home / Self Care | Payer: 59 | Source: Ambulatory Visit | Attending: Hematology | Admitting: Hematology

## 2019-07-10 ENCOUNTER — Other Ambulatory Visit: Payer: Self-pay

## 2019-07-10 DIAGNOSIS — C20 Malignant neoplasm of rectum: Secondary | ICD-10-CM | POA: Insufficient documentation

## 2019-07-10 LAB — CBC WITH DIFFERENTIAL/PLATELET
Abs Immature Granulocytes: 0.01 10*3/uL (ref 0.00–0.07)
Basophils Absolute: 0 10*3/uL (ref 0.0–0.1)
Basophils Relative: 1 %
Eosinophils Absolute: 0 10*3/uL (ref 0.0–0.5)
Eosinophils Relative: 1 %
HCT: 38.8 % (ref 36.0–46.0)
Hemoglobin: 13 g/dL (ref 12.0–15.0)
Immature Granulocytes: 0 %
Lymphocytes Relative: 17 %
Lymphs Abs: 0.7 10*3/uL (ref 0.7–4.0)
MCH: 32.7 pg (ref 26.0–34.0)
MCHC: 33.5 g/dL (ref 30.0–36.0)
MCV: 97.5 fL (ref 80.0–100.0)
Monocytes Absolute: 0.2 10*3/uL (ref 0.1–1.0)
Monocytes Relative: 5 %
Neutro Abs: 3.3 10*3/uL (ref 1.7–7.7)
Neutrophils Relative %: 76 %
Platelets: 139 10*3/uL — ABNORMAL LOW (ref 150–400)
RBC: 3.98 MIL/uL (ref 3.87–5.11)
RDW: 13.5 % (ref 11.5–15.5)
WBC: 4.3 10*3/uL (ref 4.0–10.5)
nRBC: 0 % (ref 0.0–0.2)

## 2019-07-10 LAB — COMPREHENSIVE METABOLIC PANEL
ALT: 19 U/L (ref 0–44)
AST: 28 U/L (ref 15–41)
Albumin: 5.1 g/dL — ABNORMAL HIGH (ref 3.5–5.0)
Alkaline Phosphatase: 83 U/L (ref 38–126)
Anion gap: 11 (ref 5–15)
BUN: 14 mg/dL (ref 6–20)
CO2: 27 mmol/L (ref 22–32)
Calcium: 9.7 mg/dL (ref 8.9–10.3)
Chloride: 99 mmol/L (ref 98–111)
Creatinine, Ser: 0.82 mg/dL (ref 0.44–1.00)
GFR calc Af Amer: >60 mL/min (ref >60)
GFR calc non Af Amer: >60 mL/min (ref >60)
Glucose, Bld: 92 mg/dL (ref 70–99)
Potassium: 3.2 mmol/L — ABNORMAL LOW (ref 3.5–5.1)
Sodium: 137 mmol/L (ref 135–145)
Total Bilirubin: 5.1 mg/dL — ABNORMAL HIGH (ref 0.3–1.2)
Total Protein: 7.8 g/dL (ref 6.5–8.1)

## 2019-07-10 LAB — TSH: TSH: 3.646 u[IU]/mL (ref 0.350–4.500)

## 2019-07-10 MED ORDER — IOHEXOL 9 MG/ML PO SOLN
ORAL | Status: AC
  Filled 2019-07-10: qty 1000

## 2019-07-10 MED ORDER — IOHEXOL 9 MG/ML PO SOLN: 500.0000 mL | ORAL | Status: DC

## 2019-07-10 MED ORDER — IOHEXOL 300 MG/ML  SOLN
100.0000 mL | Freq: Once | INTRAMUSCULAR | Status: AC | PRN
  Administered 2019-07-10: 75 mL via INTRAVENOUS

## 2019-07-12 ENCOUNTER — Other Ambulatory Visit: Payer: Self-pay

## 2019-07-12 ENCOUNTER — Telehealth: Payer: Self-pay | Admitting: *Deleted

## 2019-07-12 ENCOUNTER — Telehealth (INDEPENDENT_AMBULATORY_CARE_PROVIDER_SITE_OTHER): Payer: 59 | Admitting: Family Medicine

## 2019-07-12 DIAGNOSIS — F329 Major depressive disorder, single episode, unspecified: Secondary | ICD-10-CM

## 2019-07-12 DIAGNOSIS — F419 Anxiety disorder, unspecified: Secondary | ICD-10-CM

## 2019-07-12 DIAGNOSIS — F32A Depression, unspecified: Secondary | ICD-10-CM

## 2019-07-12 MED ORDER — SERTRALINE HCL 50 MG PO TABS: 50.0000 mg | ORAL_TABLET | Freq: Every day | ORAL | 3 refills | Status: DC

## 2019-07-12 MED ORDER — ALPRAZOLAM 0.5 MG PO TABS: ORAL_TABLET | ORAL | 5 refills | Status: DC

## 2019-07-12 MED FILL — SERTRALINE HCL 50 MG TABLET: 50 | 30 days supply | Qty: 30 | Fill #0

## 2019-07-12 NOTE — Progress Notes (Signed)
   Subjective:    Patient ID: Samantha Reynolds, female    DOB: 13-Aug-1965, 54 y.o.   MRN: [id number]  HPI  Patient calls in today for follow up on her medications. Depression screen PHQ 2/9 07/12/2019  Decreased Interest 1  Down, Depressed, Hopeless 1  PHQ - 2 Score 2  Altered sleeping 3  Tired, decreased energy 1  Change in appetite 0  Feeling bad or failure about yourself  1  Trouble concentrating 1  Moving slowly or fidgety/restless 0  Suicidal thoughts 0  PHQ-9 Score 8  Difficult doing work/chores Somewhat difficult  Some recent data might be hidden  Very nice patient Going through some difficult times Having a hard time dealing with her cancer Very understandable Patient facing metastatic disease   Patient states she is doing well. PHQ9 Virtual Visit via Video Note  I connected with Josue Hector on 07/12/19 at  1:40 PM EDT by a video enabled telemedicine application and verified that I am speaking with the correct person using two identifiers.  Location: Patient: home Provider: office   I discussed the limitations of evaluation and management by telemedicine and the availability of in person appointments. The patient expressed understanding and agreed to proceed.  History of Present Illness:    Observations/Objective:   Assessment and Plan:   Follow Up Instructions:    I discussed the assessment and treatment plan with the patient. The patient was provided an opportunity to ask questions and all were answered. The patient agreed with the plan and demonstrated an understanding of the instructions.   The patient was advised to call back or seek an in-person evaluation if the symptoms worsen or if the condition fails to improve as anticipated.  I provided 22 minutes of non-face-to-face time during this encounter.      Review of Systems  Constitutional: Negative for activity change, appetite change and fatigue.  HENT: Negative for congestion and  rhinorrhea.   Respiratory: Negative for cough and shortness of breath.   Cardiovascular: Negative for chest pain and leg swelling.  Gastrointestinal: Negative for abdominal pain and diarrhea.  Endocrine: Negative for polydipsia and polyphagia.  Skin: Negative for color change.  Neurological: Negative for dizziness and weakness.  Psychiatric/Behavioral: Negative for behavioral problems and confusion.       Objective:   Physical Exam   Today's visit was via telephone Physical exam was not possible for this visit Video visit failed patient unable to do so therefore go with phone visit     Assessment & Plan:  Generalized anxiety disorder May continue Xanax 1 during the day Insomnia uses 1-2 Xanax at night to help her sleep she has a hard time shutting her brain down Stressed out about her cancer very understandable Patient will be seen cancer specialist coming up to discuss metastatic disease  Patient does have some mild depression along with anxiety she is willing to start low-dose sertraline caution regarding nausea should take 4 to 6 weeks to see peak effect follow-up in 6 weeks to [redacted] weeks along with patient doing a MyChart documentation follow-up in 2 weeks with [name] Patient not suicidal

## 2019-07-12 NOTE — Telephone Encounter (Signed)
Ms. ora, spittler are scheduled for a virtual visit with your provider today.    Just as we do with appointments in the office, we must obtain your consent to participate.  Your consent will be active for this visit and any virtual visit you may have with one of our providers in the next 365 days.    If you have a MyChart account, I can also send a copy of this consent to you electronically.  All virtual visits are billed to your insurance company just like a traditional visit in the office.  As this is a virtual visit, video technology does not allow for your provider to perform a traditional examination.  This may limit your provider's ability to fully assess your condition.  If your provider identifies any concerns that need to be evaluated in person or the need to arrange testing such as labs, EKG, etc, we will make arrangements to do so.    Although advances in technology are sophisticated, we cannot ensure that it will always work on either your end or our end.  If the connection with a video visit is poor, we may have to switch to a telephone visit.  With either a video or telephone visit, we are not always able to ensure that we have a secure connection.   I need to obtain your verbal consent now.   Are you willing to proceed with your visit today?   AMADIS FUHRER has provided verbal consent on 07/12/2019 for a virtual visit (video or telephone).   Patsy Lager, LPN [id number]  [id number] AM

## 2019-07-15 ENCOUNTER — Inpatient Hospital Stay (HOSPITAL_COMMUNITY): Payer: 59 | Attending: Hematology | Admitting: Hematology

## 2019-07-15 ENCOUNTER — Encounter (HOSPITAL_COMMUNITY)
Admission: RE | Admit: 2019-07-15 | Discharge: 2019-07-15 | Disposition: A | Discharge status: Home / Self Care | Payer: 59 | Source: Ambulatory Visit | Attending: Hematology | Admitting: Hematology

## 2019-07-15 ENCOUNTER — Other Ambulatory Visit: Payer: Self-pay

## 2019-07-15 ENCOUNTER — Encounter (HOSPITAL_COMMUNITY): Payer: Self-pay | Admitting: Hematology

## 2019-07-15 VITALS — BP 101/71 | HR 61 | Temp 97.1°F | Resp 18 | Wt 125.3 lb

## 2019-07-15 DIAGNOSIS — Z87891 Personal history of nicotine dependence: Secondary | ICD-10-CM | POA: Diagnosis not present

## 2019-07-15 DIAGNOSIS — Z9221 Personal history of antineoplastic chemotherapy: Secondary | ICD-10-CM | POA: Diagnosis not present

## 2019-07-15 DIAGNOSIS — Z8249 Family history of ischemic heart disease and other diseases of the circulatory system: Secondary | ICD-10-CM | POA: Diagnosis not present

## 2019-07-15 DIAGNOSIS — Z596 Low income: Secondary | ICD-10-CM | POA: Diagnosis not present

## 2019-07-15 DIAGNOSIS — Z801 Family history of malignant neoplasm of trachea, bronchus and lung: Secondary | ICD-10-CM | POA: Insufficient documentation

## 2019-07-15 DIAGNOSIS — Z85850 Personal history of malignant neoplasm of thyroid: Secondary | ICD-10-CM | POA: Insufficient documentation

## 2019-07-15 DIAGNOSIS — E039 Hypothyroidism, unspecified: Secondary | ICD-10-CM | POA: Insufficient documentation

## 2019-07-15 DIAGNOSIS — Z79899 Other long term (current) drug therapy: Secondary | ICD-10-CM | POA: Diagnosis not present

## 2019-07-15 DIAGNOSIS — Z8042 Family history of malignant neoplasm of prostate: Secondary | ICD-10-CM | POA: Insufficient documentation

## 2019-07-15 DIAGNOSIS — C20 Malignant neoplasm of rectum: Secondary | ICD-10-CM | POA: Diagnosis not present

## 2019-07-15 DIAGNOSIS — Z808 Family history of malignant neoplasm of other organs or systems: Secondary | ICD-10-CM | POA: Insufficient documentation

## 2019-07-15 DIAGNOSIS — R911 Solitary pulmonary nodule: Secondary | ICD-10-CM | POA: Insufficient documentation

## 2019-07-15 DIAGNOSIS — R200 Anesthesia of skin: Secondary | ICD-10-CM | POA: Diagnosis not present

## 2019-07-15 DIAGNOSIS — C787 Secondary malignant neoplasm of liver and intrahepatic bile duct: Secondary | ICD-10-CM | POA: Insufficient documentation

## 2019-07-15 DIAGNOSIS — R161 Splenomegaly, not elsewhere classified: Secondary | ICD-10-CM | POA: Diagnosis not present

## 2019-07-15 DIAGNOSIS — Z923 Personal history of irradiation: Secondary | ICD-10-CM | POA: Diagnosis not present

## 2019-07-15 NOTE — Progress Notes (Signed)
I met with patient and her husband, [name], during the visit with Dr. Delton Coombes.  I helped answer questions after the visit.  All were answered to their satisfaction.      After the visit, I received a call from Dr. Henrene Dodge nurse practitioner. She states that Dr. Maudie Mercury has reviewed the previous scans and agrees to proceed with MRI and then ablation of the new area on scans.  Their office will contact patient for appointment.

## 2019-07-15 NOTE — Progress Notes (Signed)
[address] [place], [place] [address]   CLINIC:  Medical Oncology/Hematology  PCP:  [name], MD [address] [place] [address] [phone]   REASON FOR VISIT:  Follow-up for rectal cancer   BRIEF ONCOLOGIC HISTORY:  Oncology History  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[place])  02/26/2018 Initial Diagnosis   Rectal cancer ([place])   03/13/2018 Cancer Staging   Staging form: Colon and Rectum, AJCC 8th Edition - Clinical stage from 03/13/2018: Stage IVA (cT3, cN1b, cM1a) - Signed by Derek Jack, MD on 03/13/2018   03/14/2018 - 07/10/2018 Chemotherapy   The patient had palonosetron (ALOXI) injection 0.25 mg, 0.25 mg, Intravenous,  Once, 7 of 8 cycles Administration: 0.25 mg (03/14/2018), 0.25 mg (03/27/2018), 0.25 mg (04/18/2018), 0.25 mg (05/02/2018), 0.25 mg (05/16/2018), 0.25 mg (06/05/2018), 0.25 mg (06/27/2018) leucovorin 800 mg in dextrose 5 % 250 mL infusion, 772 mg, Intravenous,  Once, 7 of 8 cycles Administration: 800 mg (03/14/2018), 800 mg (03/27/2018), 800 mg (05/02/2018), 800 mg (05/16/2018), 700 mg (04/18/2018), 800 mg (06/05/2018), 800 mg (06/27/2018) oxaliplatin (ELOXATIN) 165 mg in dextrose 5 % 500 mL chemo infusion, 85 mg/m2 = 165 mg, Intravenous,  Once, 6 of 7 cycles Dose modification: 68 mg/m2 (80 % of original dose 85 mg/m2, Cycle 4, Reason: Provider Judgment) Administration: 165 mg (03/14/2018), 165 mg (03/27/2018), 130 mg (05/02/2018), 130 mg (05/16/2018), 130 mg (06/05/2018), 130 mg (06/27/2018) panitumumab (VECTIBIX) 500 mg in sodium chloride 0.9 % 100 mL chemo infusion, 480 mg, Intravenous,  Once, 4 of 5 cycles Administration: 500 mg (04/18/2018), 480 mg (05/02/2018), 480 mg (05/16/2018), 480 mg (06/05/2018) fluorouracil (ADRUCIL) chemo injection 750 mg, 400 mg/m2 = 750 mg (100 % of original dose 400 mg/m2), Intravenous,  Once, 6 of 7 cycles Dose modification: 400 mg/m2 (original dose 400 mg/m2, Cycle 1), 400 mg/m2 (original dose 400  mg/m2, Cycle 3) Administration: 750 mg (03/14/2018), 750 mg (04/18/2018), 750 mg (05/02/2018), 750 mg (05/16/2018), 750 mg (06/05/2018), 750 mg (06/27/2018) fosaprepitant (EMEND) 150 mg, dexamethasone (DECADRON) 12 mg in sodium chloride 0.9 % 145 mL IVPB, , Intravenous,  Once, 4 of 5 cycles Administration:  (05/02/2018),  (05/16/2018),  (06/05/2018),  (06/27/2018) fluorouracil (ADRUCIL) 4,650 mg in sodium chloride 0.9 % 57 mL chemo infusion, 2,400 mg/m2 = 4,650 mg, Intravenous, 1 Day/Dose, 7 of 8 cycles Administration: 4,650 mg (03/14/2018), 4,650 mg (03/27/2018), 4,650 mg (04/18/2018), 4,650 mg (05/02/2018), 4,650 mg (05/16/2018), 4,650 mg (06/05/2018), 4,650 mg (06/27/2018)  for chemotherapy treatment.       CANCER STAGING: Cancer Staging Malignant neoplasm of rectum [facility]) Staging form: Colon and Rectum, AJCC 8th Edition - Clinical stage from 03/13/2018: Stage IVA (cT3, cN1b, cM1a) - Signed by Derek Jack, MD on 03/13/2018    INTERVAL HISTORY:  Ms. Riege 54 y.o. female seen for follow-up of rectal cancer.  She is accompanied by her husband today.  Lost about 10 pounds since last visit 3 months ago.  Had reversal of ileostomy.  Her bowels are slowly getting back to normal.  She is eating multiple small meals daily.  Appetite and energy levels are 50%.  REVIEW OF SYSTEMS:  Review of Systems  Constitutional: Positive for unexpected weight change.  All other systems reviewed and are negative.    PAST MEDICAL/SURGICAL HISTORY:  Past Medical History:  Diagnosis Date  . Anxiety   . Cancer [facility]) 2013   thyroid  . Endometrial polyp   . Endometrial thickening on ultra sound   . GERD (gastroesophageal  reflux disease)   . 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   Worse in her 42s when she was ill  . H/O Hashimoto thyroiditis   . History of chemotherapy   . History of radiation therapy   . History of thyroid cancer no recurrence   2013--  s/p  left lobe thyroidectomy--  follicular varient papillary /  lymphocyctic thyroiditis  . Hyperlipidemia 11/09/2015  . Hypothyroidism   . 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[facility]) 02/26/2018   Past Surgical History:  Procedure Laterality Date  . BIOPSY  02/19/2018   Procedure: BIOPSY;  Surgeon: Rogene Houston, MD;  [facility];  Service: Endoscopy;;  rectum  . COLONOSCOPY N/A 08/20/2015   Procedure: COLONOSCOPY;  Surgeon: Rogene Houston, MD;  [facility];  Service: Endoscopy;  Laterality: N/A;  730  . [place] ENDOMETRIAL ABLATION  08-13-2004  . DIVERTING ILEOSTOMY N/A 12/07/2018   Procedure: DIVERTING LOOP ILEOSTOMY;  Surgeon: Leighton Ruff, MD;  [facility];  Service: General;  Laterality: N/A;  . FLEXIBLE SIGMOIDOSCOPY N/A 02/19/2018   Procedure: FLEXIBLE SIGMOIDOSCOPY;  Surgeon: Rogene Houston, MD;  [facility];  Service: Endoscopy;  Laterality: N/A;  . HYSTEROSCOPY WITH D & C N/A 04/30/2015   Procedure: DILATATION AND CURETTAGE / INTENDED HYSTEROSCOPY;  Surgeon: Dian Queen, MD;  Location: [facility];  Service: Gynecology;  Laterality: N/A;  . ILEOSTOMY CLOSURE N/A 04/17/2019   Procedure: LOOP ILEOSTOMY REVERSAL;  Surgeon: Leighton Ruff, MD;  [facility];  Service: General;  Laterality: N/A;  . IR US GUIDE BX ASP/DRAIN  03/09/2018  . LAPAROSCOPIC CHOLECYSTECTOMY  04/1996  . LAPAROSCOPY N/A 12/11/2018   Procedure: LAPAROSCOPY  ILEOSTOMY REVISION AND ABDOMINAL WASHOUT;  Surgeon: Leighton Ruff, MD;  [facility];  Service: General;  Laterality: N/A;  . Liver Microwave   10/17/2018  . POLYPECTOMY  08/20/2015   Procedure: POLYPECTOMY;  Surgeon: Rogene Houston, MD;  [facility];  Service: Endoscopy;;  Splenic flexure polypectomy  . POLYPECTOMY  02/19/2018   Procedure: POLYPECTOMY;  Surgeon: Rogene Houston, MD;  [facility];  Service: Endoscopy;;  rectum  . PORTACATH PLACEMENT N/A 03/14/2018   Procedure: INSERTION PORT-A-CATH;  Surgeon:  Aviva Signs, MD;  Location: [facility];  Service: General;  Laterality: N/A;  . REDUCTION INCARCERATED UTERUS  06-20-2000   intrauterine preg. 13 wks /  urinary retention  . THYROID LOBECTOMY  11/24/2011   Procedure: THYROID LOBECTOMY;  Surgeon: Earnstine Regal, MD;  [facility];  Service: General;  Laterality: Left;  Left Thyroid Lobectomy  . TUBAL LIGATION  2002  . XI ROBOTIC ASSISTED LOWER ANTERIOR RESECTION N/A 12/07/2018   Procedure: XI ROBOTIC ASSISTED LOWER ANTERIOR RESECTION, SPENIC FLEXURE IMMOBILIZATION, COLOANAL ANASTOMOSIS, RIGID PROCTOSCOPY;  Surgeon: Leighton Ruff, MD;  [facility];  Service: General;  Laterality: N/A;     SOCIAL HISTORY:  Social History   Socioeconomic History  . Marital status: Married    Spouse name: Not on file  . Number of children: 4  . Years of education: Not on file  . Highest education level: Not on file  Occupational History    Comment: Scheduler at [employer]  . Smoking status: Former Smoker    Packs/day: 0.25    Years: 10.00    Pack years: 2.50    Types: Cigarettes    Quit date: 10/31/1991    Years since quitting: 27.7  . Smokeless  tobacco: Never Used  Substance and Sexual Activity  . Alcohol use: No  . Drug use: No  . Sexual activity: Not Currently  Other Topics Concern  . Not on file  Social History Narrative   Lives with husband [name] and 22 year old son [name]   Husband is Interior and spatial designer of [employer]   Social Determinants of Health   Financial Resource Strain:   . Difficulty of Paying Living Expenses:   Food Insecurity:   . Worried About Charity fundraiser in the Last Year:   . Arboriculturist in the Last Year:   Transportation Needs:   . Film/video editor (Medical):   Marland Kitchen Lack of Transportation (Non-Medical):   Physical Activity:   . Days of Exercise per Week:   . Minutes of Exercise per Session:   Stress:   . Feeling of Stress :   Social Connections:   . Frequency of Communication with Friends  and Family:   . Frequency of Social Gatherings with Friends and Family:   . Attends Religious Services:   . Active Member of Clubs or Organizations:   . Attends Archivist Meetings:   Marland Kitchen Marital Status:   Intimate Partner Violence:   . Fear of Current or Ex-Partner:   . Emotionally Abused:   Marland Kitchen Physically Abused:   . Sexually Abused:     FAMILY HISTORY:  Family History  Problem Relation Age of Onset  . Hypertension Mother   . Hypertension Father   . Prostate cancer Father   . Cancer Maternal Aunt        spine/back  . Cancer Paternal Grandfather        lung  . Healthy Son   . Healthy Son   . Healthy Son   . Healthy Son   . Colon cancer Neg Hx     CURRENT MEDICATIONS:  Outpatient Encounter Medications as of 07/15/2019  Medication Sig Note  . ALPRAZolam (XANAX) 0.5 MG tablet Take one tablet tid   . dicyclomine (BENTYL) 10 MG capsule Take 1 capsule (10 mg total) by mouth 3 (three) times daily before meals.   Marland Kitchen loperamide (IMODIUM) 2 MG capsule Take 1 capsule (2 mg total) by mouth 3 (three) times daily. 07/12/2019: 4 x per day  . OVER THE COUNTER MEDICATION Multivitamin w/iron   . thyroid (ARMOUR THYROID) 30 MG tablet Take 1 tablet (30 mg total) by mouth daily before breakfast. Take one '30mg'$  tablet along with a '15mg'$  tablet daily (Patient taking differently: Take 60 mg by mouth daily before breakfast. ) 04/15/2019: Pt 30 mg and 15 mg tablet daily for a total dose of 45 mg daily  . benazepril (LOTENSIN) 10 MG tablet    . fluticasone (CUTIVATE) 0.05 % cream Apply 1 application topically daily as needed (irritatiton).   Marland Kitchen lidocaine-prilocaine (EMLA) cream Apply small amount to port site one hour prior to appointment and cover with plastic wrap. (Patient not taking: Reported on 07/15/2019)   . ondansetron (ZOFRAN) 4 MG tablet Take 1 tablet (4 mg total) by mouth every 8 (eight) hours as needed for nausea or vomiting. (Patient not taking: Reported on 07/12/2019) 04/24/2019: States  compazine works better than Zofran for her nausea  . prochlorperazine (COMPAZINE) 10 MG tablet Take 10 mg by mouth every 6 (six) hours as needed for nausea or vomiting.   . sertraline (ZOLOFT) 50 MG tablet Take 1 tablet (50 mg total) by mouth daily. 07/15/2019: Pt hasn't started yet  .  traMADol (ULTRAM) 50 MG tablet Take 1 tablet (50 mg total) by mouth every 6 (six) hours as needed (mild pain). (Patient not taking: Reported on 07/12/2019)   . [DISCONTINUED] progesterone (PROMETRIUM) 100 MG capsule Take by mouth.   . [DISCONTINUED] thyroid (ARMOUR THYROID) 15 MG tablet Take 1 tablet (15 mg total) by mouth daily. Take one '15mg'$  tablet along with one 30 mg tablet (Patient taking differently: Take 15 mg by mouth daily. ) 04/15/2019: Pt takes 15 mg and 30 mg tablet daily for a total dose of 45 mg daily  . [DISCONTINUED] vitamin B-12 (CYANOCOBALAMIN) 1000 MCG tablet Take 1,000 mcg by mouth daily.    Facility-Administered Encounter Medications as of 07/15/2019  Medication  . clindamycin (CLEOCIN) 900 mg in dextrose 5 % 50 mL IVPB   And  . gentamicin (GARAMYCIN) 350 mg in dextrose 5 % 50 mL IVPB  . clindamycin (CLEOCIN) 900 mg in dextrose 5 % 50 mL IVPB   And  . gentamicin (GARAMYCIN) 5 mg/kg in dextrose 5 % 50 mL IVPB  . sodium chloride 0.9 % 1,000 mL with potassium chloride 20 mEq, magnesium sulfate 2 g infusion    ALLERGIES:  I have reviewed her allergies.  PHYSICAL EXAM:  ECOG Performance status: 0  Vitals:   07/15/19 0822  BP: 101/71  Pulse: 61  Resp: 18  Temp: (!) 97.1 F (36.2 C)  SpO2: 99%   Filed Weights   07/15/19 0822  Weight: 125 lb 4.8 oz (56.8 kg)    Physical Exam Vitals reviewed.  Constitutional:      Appearance: Normal appearance.  Cardiovascular:     Rate and Rhythm: Normal rate and regular rhythm.     Heart sounds: Normal heart sounds.  Pulmonary:     Effort: Pulmonary effort is normal.     Breath sounds: Normal breath sounds.  Abdominal:     General: There is  no distension.     Palpations: Abdomen is soft. There is no mass.  Musculoskeletal:        General: No swelling.  Skin:    General: Skin is warm.  Neurological:     General: No focal deficit present.     Mental Status: She is alert and oriented to person, place, and time.  Psychiatric:        Mood and Affect: Mood normal.     Right upper quadrant ileostomy present.  LABORATORY DATA:  I have reviewed the labs as listed.  CBC    Component Value Date/Time   WBC 4.3 07/10/2019 0844   RBC 3.98 07/10/2019 0844   HGB 13.0 07/10/2019 0844   HGB 13.5 11/03/2015 0841   HCT 38.8 07/10/2019 0844   HCT 39.7 11/03/2015 0841   PLT 139 (L) 07/10/2019 0844   PLT 165 11/03/2015 0841   MCV 97.5 07/10/2019 0844   MCV 95 11/03/2015 0841   MCH 32.7 07/10/2019 0844   MCHC 33.5 07/10/2019 0844   RDW 13.5 07/10/2019 0844   RDW 13.2 11/03/2015 0841   LYMPHSABS 0.7 07/10/2019 0844   LYMPHSABS 1.4 11/03/2015 0841   MONOABS 0.2 07/10/2019 0844   EOSABS 0.0 07/10/2019 0844   EOSABS 0.1 11/03/2015 0841   BASOSABS 0.0 07/10/2019 0844   BASOSABS 0.0 11/03/2015 0841   CMP Latest Ref Rng & Units 07/10/2019 04/20/2019 04/19/2019  Glucose 70 - 99 mg/dL 92 96 113(H)  BUN 6 - 20 mg/dL '14 11 13  '$ Creatinine 0.44 - 1.00 mg/dL 0.82 0.82 0.78  Sodium 135 -  145 mmol/L 137 135 135  Potassium 3.5 - 5.1 mmol/L 3.2(L) 3.4(L) 3.5  Chloride 98 - 111 mmol/L 99 98 100  CO2 22 - 32 mmol/L '27 28 28  '$ Calcium 8.9 - 10.3 mg/dL 9.7 8.7(L) 8.6(L)  Total Protein 6.5 - 8.1 g/dL 7.8 - -  Total Bilirubin 0.3 - 1.2 mg/dL 5.1(H) - -  Alkaline Phos 38 - 126 U/L 83 - -  AST 15 - 41 U/L 28 - -  ALT 0 - 44 U/L 19 - -       DIAGNOSTIC IMAGING:  I have reviewed scans and discussed with the patient.   I have reviewed Venita Lick LPN's note and agree with the documentation.  I personally performed a face-to-face visit, made revisions and my assessment and plan is as follows.    ASSESSMENT & PLAN:   Malignant neoplasm  of rectum ([facility]) 1.  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  Liver biopsy on 03/09/2018 consistent with adenocarcinoma. 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 -7 cycles of FOLFOX with vectibix completed on 06/27/2018. -XRT with Xeloda from 07/30/2018 through 09/06/2018. -Right liver lesion microwave ablation on 10/15/2018 at [facility]. -Low anterior resection and diverting ileostomy on 12/07/2018, pathology [id number], 0/6 lymph nodes positive, margins negative. -Loop ileostomy reversal on 04/17/2019. -We reviewed results of the CT abdomen and pelvis from 07/10/2019.  Left lower lobe pulmonary nodule increased by 1 mm, measuring 8 mm.  3 mm left upper lobe lung nodule is unchanged.  Probable 4 mm right apical pulmonary nodule felt to be enlarged from 2 mm from October 2020.  Vague hypoattenuation within the posterior right hepatic lobe measures 1.5 cm, measuring 1.2 cm in October 2020.  Subtle subcapsular right hepatic lobe hypoattenuating 1 cm lesion was not seen on prior scans. -We have sent CEA level from today.  LFTs are grossly normal except elevated bilirubin. -We have reached out to [facility] Maudie [facility] at [facility]. -I have ordered MRI of the liver with and without Gadavist.  She will likely be a candidate for further ablations.  2.  Hyperbilirubinemia: -She has a total bilirubin of 5.1.  She has Gilbert's syndrome.  Baseline is between 2 and 3.  3.  Hypothyroidism: -She is on Armour Thyroid.  TSH today is 3.6.  4.  Weight loss: -She had 10 pound weight loss since her last visit 3 months ago.  However she had ileostomy reversal and is adjusting to new food habits.    Orders placed this encounter:  Orders Placed This Encounter  Procedures  . MR Abdomen W Wo Contrast  . CEA   Total time spent is 30 minutes with more than 50% of the time spent face-to-face discussing scan results, further plan, counseling and  coordination of care.   Derek Jack, MD [place] [phone]

## 2019-07-15 NOTE — Patient Instructions (Addendum)
[place] at [facility] Discharge Instructions  You were seen today by Dr. Delton Coombes. He went over your recent test results. He will schedule you for an MRI as well as labs today. He will see you back after the scan for follow up.   Thank you for choosing [place] at [facility] to provide your oncology and hematology care.  To afford each patient quality time with our provider, please arrive at least 15 minutes before your scheduled appointment time.   If you have a lab appointment with the [facility] please come in thru the  Main Entrance and check in at the main information desk  You need to re-schedule your appointment should you arrive 10 or more minutes late.  We strive to give you quality time with our providers, and arriving late affects you and other patients whose appointments are after yours.  Also, if you no show three or more times for appointments you may be dismissed from the clinic at the providers discretion.     Again, thank you for choosing [facility].  Our hope is that these requests will decrease the amount of time that you wait before being seen by our physicians.       _____________________________________________________________  Should you have questions after your visit to [facility], please contact our office at [phone] between the hours of 8:00 a.m. and 4:30 p.m.  Voicemails left after 4:00 p.m. will not be returned until the following business day.  For prescription refill requests, have your pharmacy contact our office and allow 72 hours.    Cancer Center Support Programs:   > Cancer Support Group  2nd Tuesday of the month 1pm-2pm, Journey Room

## 2019-07-15 NOTE — Assessment & Plan Note (Signed)
1.  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  Liver biopsy on 03/09/2018 consistent with adenocarcinoma. 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 -7 cycles of FOLFOX with vectibix completed on 06/27/2018. -XRT with Xeloda from 07/30/2018 through 09/06/2018. -Right liver lesion microwave ablation on 10/15/2018 at [facility]. -Low anterior resection and diverting ileostomy on 12/07/2018, pathology [id number], 0/6 lymph nodes positive, margins negative. -Loop ileostomy reversal on 04/17/2019. -We reviewed results of the CT abdomen and pelvis from 07/10/2019.  Left lower lobe pulmonary nodule increased by 1 mm, measuring 8 mm.  3 mm left upper lobe lung nodule is unchanged.  Probable 4 mm right apical pulmonary nodule felt to be enlarged from 2 mm from October 2020.  Vague hypoattenuation within the posterior right hepatic lobe measures 1.5 cm, measuring 1.2 cm in October 2020.  Subtle subcapsular right hepatic lobe hypoattenuating 1 cm lesion was not seen on prior scans. -We have sent CEA level from today.  LFTs are grossly normal except elevated bilirubin. -We have reached out to Dr. Maudie Mercury at [facility]. -I have ordered MRI of the liver with and without Gadavist.  She will likely be a candidate for further ablations.  2.  Hyperbilirubinemia: -She has a total bilirubin of 5.1.  She has Gilbert's syndrome.  Baseline is between 2 and 3.  3.  Hypothyroidism: -She is on Armour Thyroid.  TSH today is 3.6.  4.  Weight loss: -She had 10 pound weight loss since her last visit 3 months ago.  However she had ileostomy reversal and is adjusting to new food habits.

## 2019-07-16 ENCOUNTER — Ambulatory Visit (HOSPITAL_COMMUNITY)
Admission: RE | Admit: 2019-07-16 | Discharge: 2019-07-16 | Disposition: A | Discharge status: Home / Self Care | Payer: 59 | Source: Ambulatory Visit | Attending: Hematology | Admitting: Hematology

## 2019-07-16 DIAGNOSIS — C20 Malignant neoplasm of rectum: Secondary | ICD-10-CM | POA: Insufficient documentation

## 2019-07-16 DIAGNOSIS — R161 Splenomegaly, not elsewhere classified: Secondary | ICD-10-CM | POA: Diagnosis not present

## 2019-07-16 DIAGNOSIS — K7689 Other specified diseases of liver: Secondary | ICD-10-CM | POA: Diagnosis not present

## 2019-07-16 LAB — CEA: CEA: 2.4 ng/mL (ref 0.0–4.7)

## 2019-07-16 MED ORDER — GADOBUTROL 1 MMOL/ML IV SOLN
7.0000 mL | Freq: Once | INTRAVENOUS | Status: AC | PRN
  Administered 2019-07-16: 7 mL via INTRAVENOUS

## 2019-07-17 ENCOUNTER — Other Ambulatory Visit: Payer: Self-pay | Admitting: Family Medicine

## 2019-07-17 ENCOUNTER — Encounter: Payer: Self-pay | Admitting: Family Medicine

## 2019-07-22 ENCOUNTER — Other Ambulatory Visit: Payer: Self-pay

## 2019-07-22 ENCOUNTER — Inpatient Hospital Stay (HOSPITAL_BASED_OUTPATIENT_CLINIC_OR_DEPARTMENT_OTHER): Payer: 59 | Admitting: Hematology

## 2019-07-22 VITALS — BP 91/67 | HR 84 | Temp 96.5°F | Resp 16 | Wt 125.8 lb

## 2019-07-22 DIAGNOSIS — R200 Anesthesia of skin: Secondary | ICD-10-CM | POA: Diagnosis not present

## 2019-07-22 DIAGNOSIS — E039 Hypothyroidism, unspecified: Secondary | ICD-10-CM | POA: Diagnosis not present

## 2019-07-22 DIAGNOSIS — C20 Malignant neoplasm of rectum: Secondary | ICD-10-CM | POA: Diagnosis not present

## 2019-07-22 DIAGNOSIS — Z79899 Other long term (current) drug therapy: Secondary | ICD-10-CM | POA: Diagnosis not present

## 2019-07-22 DIAGNOSIS — R911 Solitary pulmonary nodule: Secondary | ICD-10-CM | POA: Diagnosis not present

## 2019-07-22 DIAGNOSIS — C787 Secondary malignant neoplasm of liver and intrahepatic bile duct: Secondary | ICD-10-CM | POA: Diagnosis not present

## 2019-07-22 DIAGNOSIS — Z596 Low income: Secondary | ICD-10-CM | POA: Diagnosis not present

## 2019-07-22 DIAGNOSIS — R161 Splenomegaly, not elsewhere classified: Secondary | ICD-10-CM | POA: Diagnosis not present

## 2019-07-22 NOTE — Patient Instructions (Addendum)
[place] at [facility] Discharge Instructions  You were seen today by Dr. Delton Coombes. He went over your recent lab results. He will see you back in 3 months for labs, CT  and follow up.   Thank you for choosing [place] at [facility] to provide your oncology and hematology care.  To afford each patient quality time with our provider, please arrive at least 15 minutes before your scheduled appointment time.   If you have a lab appointment with the [facility] please come in thru the  Main Entrance and check in at the main information desk  You need to re-schedule your appointment should you arrive 10 or more minutes late.  We strive to give you quality time with our providers, and arriving late affects you and other patients whose appointments are after yours.  Also, if you no show three or more times for appointments you may be dismissed from the clinic at the providers discretion.     Again, thank you for choosing [facility].  Our hope is that these requests will decrease the amount of time that you wait before being seen by our physicians.       _____________________________________________________________  Should you have questions after your visit to [facility], please contact our office at [phone] between the hours of 8:00 a.m. and 4:30 p.m.  Voicemails left after 4:00 p.m. will not be returned until the following business day.  For prescription refill requests, have your pharmacy contact our office and allow 72 hours.    Cancer Center Support Programs:   > Cancer Support Group  2nd Tuesday of the month 1pm-2pm, Journey Room

## 2019-07-22 NOTE — Progress Notes (Signed)
[place], [place] [address]   CLINIC:  Medical Oncology/Hematology  PCP:  [name], MD [address] [place] [address] [phone]   REASON FOR VISIT:  Follow-up for rectal cancer   BRIEF ONCOLOGIC HISTORY:  Oncology History  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[place])  02/26/2018 Initial Diagnosis   Rectal cancer ([place])   03/13/2018 Cancer Staging   Staging form: Colon and Rectum, AJCC 8th Edition - Clinical stage from 03/13/2018: Stage IVA (cT3, cN1b, cM1a) - Signed by Derek Jack, MD on 03/13/2018   03/14/2018 - 07/10/2018 Chemotherapy   The patient had palonosetron (ALOXI) injection 0.25 mg, 0.25 mg, Intravenous,  Once, 7 of 8 cycles Administration: 0.25 mg (03/14/2018), 0.25 mg (03/27/2018), 0.25 mg (04/18/2018), 0.25 mg (05/02/2018), 0.25 mg (05/16/2018), 0.25 mg (06/05/2018), 0.25 mg (06/27/2018) leucovorin 800 mg in dextrose 5 % 250 mL infusion, 772 mg, Intravenous,  Once, 7 of 8 cycles Administration: 800 mg (03/14/2018), 800 mg (03/27/2018), 800 mg (05/02/2018), 800 mg (05/16/2018), 700 mg (04/18/2018), 800 mg (06/05/2018), 800 mg (06/27/2018) oxaliplatin (ELOXATIN) 165 mg in dextrose 5 % 500 mL chemo infusion, 85 mg/m2 = 165 mg, Intravenous,  Once, 6 of 7 cycles Dose modification: 68 mg/m2 (80 % of original dose 85 mg/m2, Cycle 4, Reason: Provider Judgment) Administration: 165 mg (03/14/2018), 165 mg (03/27/2018), 130 mg (05/02/2018), 130 mg (05/16/2018), 130 mg (06/05/2018), 130 mg (06/27/2018) panitumumab (VECTIBIX) 500 mg in sodium chloride 0.9 % 100 mL chemo infusion, 480 mg, Intravenous,  Once, 4 of 5 cycles Administration: 500 mg (04/18/2018), 480 mg (05/02/2018), 480 mg (05/16/2018), 480 mg (06/05/2018) fluorouracil (ADRUCIL) chemo injection 750 mg, 400 mg/m2 = 750 mg (100 % of original dose 400 mg/m2), Intravenous,  Once, 6 of 7 cycles Dose modification: 400 mg/m2 (original dose 400 mg/m2, Cycle 1), 400 mg/m2 (original dose 400  mg/m2, Cycle 3) Administration: 750 mg (03/14/2018), 750 mg (04/18/2018), 750 mg (05/02/2018), 750 mg (05/16/2018), 750 mg (06/05/2018), 750 mg (06/27/2018) fosaprepitant (EMEND) 150 mg, dexamethasone (DECADRON) 12 mg in sodium chloride 0.9 % 145 mL IVPB, , Intravenous,  Once, 4 of 5 cycles Administration:  (05/02/2018),  (05/16/2018),  (06/05/2018),  (06/27/2018) fluorouracil (ADRUCIL) 4,650 mg in sodium chloride 0.9 % 57 mL chemo infusion, 2,400 mg/m2 = 4,650 mg, Intravenous, 1 Day/Dose, 7 of 8 cycles Administration: 4,650 mg (03/14/2018), 4,650 mg (03/27/2018), 4,650 mg (04/18/2018), 4,650 mg (05/02/2018), 4,650 mg (05/16/2018), 4,650 mg (06/05/2018), 4,650 mg (06/27/2018)  for chemotherapy treatment.       CANCER STAGING: Cancer Staging Malignant neoplasm of rectum [facility]) Staging form: Colon and Rectum, AJCC 8th Edition - Clinical stage from 03/13/2018: Stage IVA (cT3, cN1b, cM1a) - Signed by Derek Jack, MD on 03/13/2018    INTERVAL HISTORY:  Samantha Reynolds 54 y.o. female seen for follow-up of rectal cancer.  She had MRI of the abdomen done.  Appetite is 50%.  Energy levels are 75%.  Numbness in the fingertips and toes is stable.  Denies any other abdominal pains.  She is accompanied by her husband today.  REVIEW OF SYSTEMS:  Review of Systems  Neurological: Positive for numbness.  All other systems reviewed and are negative.    PAST MEDICAL/SURGICAL HISTORY:  Past Medical History:  Diagnosis Date  . Anxiety   . Cancer [facility]) 2013   thyroid  . Endometrial polyp   . Endometrial thickening on ultra sound   . GERD (gastroesophageal reflux disease)   . 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  Worse in her 64s when she was ill  . H/O Hashimoto thyroiditis   . History of chemotherapy   . History of radiation therapy   . 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 Hyperlipidemia 11/09/2015  . Hypothyroidism    . 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[place]) 02/26/2018   Past Surgical History:  Procedure Laterality Date  . BIOPSY  02/19/2018   Procedure: BIOPSY;  Surgeon: Rogene Houston, MD;  [facility];  Service: Endoscopy;;  rectum  . COLONOSCOPY N/A 08/20/2015   Procedure: COLONOSCOPY;  Surgeon: Rogene Houston, MD;  [facility];  Service: Endoscopy;  Laterality: N/A;  730  . [place] ENDOMETRIAL ABLATION  08-13-2004  . DIVERTING ILEOSTOMY N/A 12/07/2018   Procedure: DIVERTING LOOP ILEOSTOMY;  Surgeon: Leighton Ruff, MD;  [facility];  Service: General;  Laterality: N/A;  . FLEXIBLE SIGMOIDOSCOPY N/A 02/19/2018   Procedure: FLEXIBLE SIGMOIDOSCOPY;  Surgeon: Rogene Houston, MD;  [facility];  Service: Endoscopy;  Laterality: N/A;  . HYSTEROSCOPY WITH D & C N/A 04/30/2015   Procedure: DILATATION AND CURETTAGE / INTENDED HYSTEROSCOPY;  Surgeon: Dian Queen, MD;  Location: [place];  Service: Gynecology;  Laterality: N/A;  . ILEOSTOMY CLOSURE N/A 04/17/2019   Procedure: LOOP ILEOSTOMY REVERSAL;  Surgeon: Leighton Ruff, MD;  [facility];  Service: General;  Laterality: N/A;  . IR US GUIDE BX ASP/DRAIN  03/09/2018  . LAPAROSCOPIC CHOLECYSTECTOMY  04/1996  . LAPAROSCOPY N/A 12/11/2018   Procedure: LAPAROSCOPY  ILEOSTOMY REVISION AND ABDOMINAL WASHOUT;  Surgeon: Leighton Ruff, MD;  [facility];  Service: General;  Laterality: N/A;  . Liver Microwave   10/17/2018  . POLYPECTOMY  08/20/2015   Procedure: POLYPECTOMY;  Surgeon: Rogene Houston, MD;  [facility];  Service: Endoscopy;;  Splenic flexure polypectomy  . POLYPECTOMY  02/19/2018   Procedure: POLYPECTOMY;  Surgeon: Rogene Houston, MD;  [facility];  Service: Endoscopy;;  rectum  . PORTACATH PLACEMENT N/A 03/14/2018   Procedure: INSERTION PORT-A-CATH;  Surgeon: Aviva Signs, MD;  Location[facility];  Service: General;  Laterality:  N/A;  . REDUCTION INCARCERATED UTERUS  06-20-2000   intrauterine preg. 13 wks /  urinary retention  . THYROID LOBECTOMY  11/24/2011   Procedure: THYROID LOBECTOMY;  Surgeon: Earnstine Regal, MD;  [facility];  Service: General;  Laterality: Left;  Left Thyroid Lobectomy  . TUBAL LIGATION  2002  . XI ROBOTIC ASSISTED LOWER ANTERIOR RESECTION N/A 12/07/2018   Procedure: XI ROBOTIC ASSISTED LOWER ANTERIOR RESECTION, SPENIC FLEXURE IMMOBILIZATION, COLOANAL ANASTOMOSIS, RIGID PROCTOSCOPY;  Surgeon: Leighton Ruff, MD;  [facility];  Service: General;  Laterality: N/A;     SOCIAL HISTORY:  Social History   Socioeconomic History  . Marital status: Married    Spouse name: Not on file  . Number of children: 4  . Years of education: Not on file  . Highest education level: Not on file  Occupational History    Comment: Scheduler at [employer]  . Smoking status: Former Smoker    Packs/day: 0.25    Years: 10.00    Pack years: 2.50    Types: Cigarettes    Quit date: 10/31/1991    Years since quitting: 27.7  . Smokeless tobacco: Never Used  Substance and Sexual Activity  .  Alcohol use: No  . Drug use: No  . Sexual activity: Not Currently  Other Topics Concern  . Not on file  Social History Narrative   Lives with husband [name] and 76 year old son [name]   Husband is Interior and spatial designer of [employer]   Social Determinants of Health   Financial Resource Strain:   . Difficulty of Paying Living Expenses:   Food Insecurity:   . Worried About Charity fundraiser in the Last Year:   . Arboriculturist in the Last Year:   Transportation Needs:   . Film/video editor (Medical):   Marland Kitchen Lack of Transportation (Non-Medical):   Physical Activity:   . Days of Exercise per Week:   . Minutes of Exercise per Session:   Stress:   . Feeling of Stress :   Social Connections:   . Frequency of Communication with Friends and Family:   . Frequency of Social Gatherings with Friends and  Family:   . Attends Religious Services:   . Active Member of Clubs or Organizations:   . Attends Archivist Meetings:   Marland Kitchen Marital Status:   Intimate Partner Violence:   . Fear of Current or Ex-Partner:   . Emotionally Abused:   Marland Kitchen Physically Abused:   . Sexually Abused:     FAMILY HISTORY:  Family History  Problem Relation Age of Onset  . Hypertension Mother   . Hypertension Father   . Prostate cancer Father   . Cancer Maternal Aunt        spine/back  . Cancer Paternal Grandfather        lung  . Healthy Son   . Healthy Son   . Healthy Son   . Healthy Son   . Colon cancer Neg Hx     CURRENT MEDICATIONS:  Outpatient Encounter Medications as of 07/22/2019  Medication Sig Note  . ALPRAZolam (XANAX) 0.5 MG tablet Take one tablet tid   . dicyclomine (BENTYL) 10 MG capsule Take 1 capsule (10 mg total) by mouth 3 (three) times daily before meals.   Marland Kitchen loperamide (IMODIUM) 2 MG capsule Take 1 capsule (2 mg total) by mouth 3 (three) times daily. 07/12/2019: 4 x per day  . OVER THE COUNTER MEDICATION Multivitamin w/iron   . thyroid (ARMOUR THYROID) 30 MG tablet Take 1 tablet (30 mg total) by mouth daily before breakfast. Take one '30mg'$  tablet along with a '15mg'$  tablet daily (Patient taking differently: Take 60 mg by mouth daily before breakfast. ) 04/15/2019: Pt 30 mg and 15 mg tablet daily for a total dose of 45 mg daily  . fluticasone (CUTIVATE) 0.05 % cream Apply 1 application topically daily as needed (irritatiton).   Marland Kitchen lidocaine-prilocaine (EMLA) cream Apply small amount to port site one hour prior to appointment and cover with plastic wrap. (Patient not taking: Reported on 07/15/2019)   . ondansetron (ZOFRAN) 4 MG tablet Take 1 tablet (4 mg total) by mouth every 8 (eight) hours as needed for nausea or vomiting. (Patient not taking: Reported on 07/12/2019) 04/24/2019: States compazine works better than Zofran for her nausea  . prochlorperazine (COMPAZINE) 10 MG tablet Take 10 mg by  mouth every 6 (six) hours as needed for nausea or vomiting.   . traMADol (ULTRAM) 50 MG tablet Take 1 tablet (50 mg total) by mouth every 6 (six) hours as needed (mild pain). (Patient not taking: Reported on 07/12/2019)   . [DISCONTINUED] benazepril (LOTENSIN) 10 MG tablet  Facility-Administered Encounter Medications as of 07/22/2019  Medication  . clindamycin (CLEOCIN) 900 mg in dextrose 5 % 50 mL IVPB   And  . gentamicin (GARAMYCIN) 350 mg in dextrose 5 % 50 mL IVPB  . clindamycin (CLEOCIN) 900 mg in dextrose 5 % 50 mL IVPB   And  . gentamicin (GARAMYCIN) 5 mg/kg in dextrose 5 % 50 mL IVPB  . sodium chloride 0.9 % 1,000 mL with potassium chloride 20 mEq, magnesium sulfate 2 g infusion    ALLERGIES:  I have reviewed her allergies.  PHYSICAL EXAM:  ECOG Performance status: 0  Vitals:   07/22/19 0859  BP: 91/67  Pulse: 84  Resp: 16  Temp: (!) 96.5 F (35.8 C)  SpO2: 100%   Filed Weights   07/22/19 0859  Weight: 125 lb 12.8 oz (57.1 kg)    Physical Exam Vitals reviewed.  Constitutional:      Appearance: Normal appearance.  Cardiovascular:     Rate and Rhythm: Normal rate and regular rhythm.     Heart sounds: Normal heart sounds.  Pulmonary:     Effort: Pulmonary effort is normal.     Breath sounds: Normal breath sounds.  Abdominal:     General: There is no distension.     Palpations: Abdomen is soft. There is no mass.  Musculoskeletal:        General: No swelling.  Skin:    General: Skin is warm.  Neurological:     General: No focal deficit present.     Mental Status: She is alert and oriented to person, place, and time.  Psychiatric:        Mood and Affect: Mood normal.       LABORATORY DATA:  I have reviewed the labs as listed.  CBC    Component Value Date/Time   WBC 4.3 07/10/2019 0844   RBC 3.98 07/10/2019 0844   HGB 13.0 07/10/2019 0844   HGB 13.5 11/03/2015 0841   HCT 38.8 07/10/2019 0844   HCT 39.7 11/03/2015 0841   PLT 139 (L) 07/10/2019  0844   PLT 165 11/03/2015 0841   MCV 97.5 07/10/2019 0844   MCV 95 11/03/2015 0841   MCH 32.7 07/10/2019 0844   MCHC 33.5 07/10/2019 0844   RDW 13.5 07/10/2019 0844   RDW 13.2 11/03/2015 0841   LYMPHSABS 0.7 07/10/2019 0844   LYMPHSABS 1.4 11/03/2015 0841   MONOABS 0.2 07/10/2019 0844   EOSABS 0.0 07/10/2019 0844   EOSABS 0.1 11/03/2015 0841   BASOSABS 0.0 07/10/2019 0844   BASOSABS 0.0 11/03/2015 0841   CMP Latest Ref Rng & Units 07/10/2019 04/20/2019 04/19/2019  Glucose 70 - 99 mg/dL 92 96 113(H)  BUN 6 - 20 mg/dL '14 11 13  '$ Creatinine 0.44 - 1.00 mg/dL 0.82 0.82 0.78  Sodium 135 - 145 mmol/L 137 135 135  Potassium 3.5 - 5.1 mmol/L 3.2(L) 3.4(L) 3.5  Chloride 98 - 111 mmol/L 99 98 100  CO2 22 - 32 mmol/L '27 28 28  '$ Calcium 8.9 - 10.3 mg/dL 9.7 8.7(L) 8.6(L)  Total Protein 6.5 - 8.1 g/dL 7.8 - -  Total Bilirubin 0.3 - 1.2 mg/dL 5.1(H) - -  Alkaline Phos 38 - 126 U/L 83 - -  AST 15 - 41 U/L 28 - -  ALT 0 - 44 U/L 19 - -       DIAGNOSTIC IMAGING:  I have reviewed scans and discussed with the patient.   I have reviewed Venita Lick LPN's note and agree  with the documentation.  I personally performed a face-to-face visit, made revisions and my assessment and plan is as follows.    ASSESSMENT & PLAN:   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[facility]) 1.  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  Liver biopsy on 03/09/2018 consistent with adenocarcinoma. 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 -7 cycles of FOLFOX with vectibix completed on 06/27/2018. -XRT with Xeloda from 07/30/2018 through 09/06/2018. -Right liver lesion microwave ablation on 10/15/2018 at [facility]. -Low anterior resection and diverting ileostomy on 12/07/2018, pathology [id number], 0/6 lymph nodes positive, margins negative. -Loop ileostomy reversal on 04/17/2019. -CTAP on 07/10/2019 showed left lower lobe pulmonary nodule increased by 1  mm, measuring 8 mm.  3 mm left upper lobe lung nodule is unchanged.  Probable 4 mm right apical pulmonary nodule felt to be enlarged from 2 mm from October 2020.  Vague hypoattenuation within the posterior right hepatic lobe measures 1.5 cm, measuring 1.2 cm in October 2020.  Subtle subcapsular right hepatic lobe hypoattenuating 1 cm lesion was not seen on prior scans. -We reviewed results of MRI of the abdomen with and without contrast from 07/16/2019.  2 enhancing lesions in the right hepatic lobe, largest one measuring 1.5 x 1.0 cm.  Inferior lesion is measured as 1 cm.  Stable splenomegaly. -CEA is 2.4. -We have made her an appointment to see Dr. Maudie Mercury at [facility] for possible ablation and other procedures. -We will see her back in 3 months with repeat CT CAP.  2.  Hyperbilirubinemia: -Total bilirubin is 5.1.  She has Gilbert's syndrome.  Baseline is between 2 and 3.  3.  Hypothyroidism: -She is on Armour Thyroid.  TSH is 3.6.  4.  Weight loss: -She had 10 pound weight loss since her last visit 3 months ago.  However she had ileostomy reversal and is adjusting to new food habits.    Orders placed this encounter:  Orders Placed This Encounter  Procedures  . CT Abdomen Pelvis W Contrast  . CT Chest W Contrast  . CBC with Differential/Platelet  . Comprehensive metabolic panel  . Magnesium  . CEA     Derek Jack, MD [place] [phone]

## 2019-07-22 NOTE — Assessment & Plan Note (Signed)
1.  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  Liver biopsy on 03/09/2018 consistent with adenocarcinoma. 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 -7 cycles of FOLFOX with vectibix completed on 06/27/2018. -XRT with Xeloda from 07/30/2018 through 09/06/2018. -Right liver lesion microwave ablation on 10/15/2018 at [facility]. -Low anterior resection and diverting ileostomy on 12/07/2018, pathology [id number], 0/6 lymph nodes positive, margins negative. -Loop ileostomy reversal on 04/17/2019. -CTAP on 07/10/2019 showed left lower lobe pulmonary nodule increased by 1 mm, measuring 8 mm.  3 mm left upper lobe lung nodule is unchanged.  Probable 4 mm right apical pulmonary nodule felt to be enlarged from 2 mm from October 2020.  Vague hypoattenuation within the posterior right hepatic lobe measures 1.5 cm, measuring 1.2 cm in October 2020.  Subtle subcapsular right hepatic lobe hypoattenuating 1 cm lesion was not seen on prior scans. -We reviewed results of MRI of the abdomen with and without contrast from 07/16/2019.  2 enhancing lesions in the right hepatic lobe, largest one measuring 1.5 x 1.0 cm.  Inferior lesion is measured as 1 cm.  Stable splenomegaly. -CEA is 2.4. -We have made her an appointment to see Dr. Maudie Mercury at [facility] for possible ablation and other procedures. -We will see her back in 3 months with repeat CT CAP.  2.  Hyperbilirubinemia: -Total bilirubin is 5.1.  She has Gilbert's syndrome.  Baseline is between 2 and 3.  3.  Hypothyroidism: -She is on Armour Thyroid.  TSH is 3.6.  4.  Weight loss: -She had 10 pound weight loss since her last visit 3 months ago.  However she had ileostomy reversal and is adjusting to new food habits.

## 2019-07-23 ENCOUNTER — Other Ambulatory Visit (HOSPITAL_COMMUNITY): Payer: 59

## 2019-07-24 ENCOUNTER — Ambulatory Visit (HOSPITAL_COMMUNITY): Payer: 59

## 2019-07-24 MED FILL — ARMOUR THYROID 60 MG TABLET: 60 | 30 days supply | Qty: 30 | Fill #2

## 2019-07-29 ENCOUNTER — Encounter: Payer: Self-pay | Admitting: Family Medicine

## 2019-07-29 DIAGNOSIS — R198 Other specified symptoms and signs involving the digestive system and abdomen: Secondary | ICD-10-CM | POA: Diagnosis not present

## 2019-07-29 DIAGNOSIS — K219 Gastro-esophageal reflux disease without esophagitis: Secondary | ICD-10-CM | POA: Diagnosis not present

## 2019-07-29 DIAGNOSIS — Z01818 Encounter for other preprocedural examination: Secondary | ICD-10-CM | POA: Diagnosis not present

## 2019-07-29 DIAGNOSIS — C19 Malignant neoplasm of rectosigmoid junction: Secondary | ICD-10-CM | POA: Diagnosis not present

## 2019-07-29 DIAGNOSIS — Z87891 Personal history of nicotine dependence: Secondary | ICD-10-CM | POA: Diagnosis not present

## 2019-07-29 DIAGNOSIS — C787 Secondary malignant neoplasm of liver and intrahepatic bile duct: Secondary | ICD-10-CM | POA: Diagnosis not present

## 2019-07-29 DIAGNOSIS — E063 Autoimmune thyroiditis: Secondary | ICD-10-CM | POA: Diagnosis not present

## 2019-07-29 DIAGNOSIS — E038 Other specified hypothyroidism: Secondary | ICD-10-CM | POA: Diagnosis not present

## 2019-07-29 DIAGNOSIS — Z932 Ileostomy status: Secondary | ICD-10-CM | POA: Diagnosis not present

## 2019-07-29 DIAGNOSIS — C20 Malignant neoplasm of rectum: Secondary | ICD-10-CM | POA: Diagnosis not present

## 2019-07-29 MED FILL — ALPRAZolam 0.5 MG TABS: 0.5 | 30 days supply | Qty: 90 | Fill #0

## 2019-07-30 ENCOUNTER — Other Ambulatory Visit (HOSPITAL_COMMUNITY): Payer: Self-pay | Admitting: *Deleted

## 2019-07-30 DIAGNOSIS — C20 Malignant neoplasm of rectum: Secondary | ICD-10-CM

## 2019-07-30 NOTE — Progress Notes (Signed)
Patient is having liver ablation at [facility] on 5/20.  She reports that the surgeon will do all the areas of concern at the same visit.  He requested that she have covid testing prior to her visit.  We will schedule her on 5/18 for the covid test.

## 2019-07-31 ENCOUNTER — Telehealth (INDEPENDENT_AMBULATORY_CARE_PROVIDER_SITE_OTHER): Payer: Self-pay | Admitting: Internal Medicine

## 2019-07-31 MED ORDER — AMITRIPTYLINE HCL 10 MG PO TABS: 10.0000 mg | ORAL_TABLET | Freq: Two times a day (BID) | ORAL | 2 refills | Status: DC

## 2019-07-31 NOTE — Telephone Encounter (Signed)
Patient called stating she is having multiple stools every couple of days.  She states over the weekend she had more than thousand stools.  Stools are soft and not loose.  She has gone to the bathroom so much that she has noted anal discomfort and noted blood on the tissue. She is on dicyclomine and loperamide. Patient advised to back off on fiber supplement. We will start patient on amitriptyline 10 mg by mouth twice daily. Patient will call with progress report in 1 to 2 weeks.

## 2019-08-01 MED FILL — AMITRIPTYLINE HCL 10 MG TAB: 10 | 30 days supply | Qty: 60 | Fill #0

## 2019-08-06 ENCOUNTER — Other Ambulatory Visit (HOSPITAL_COMMUNITY)
Admission: RE | Admit: 2019-08-06 | Discharge: 2019-08-06 | Disposition: A | Discharge status: Home / Self Care | Payer: 59 | Source: Ambulatory Visit | Attending: Internal Medicine | Admitting: Internal Medicine

## 2019-08-06 ENCOUNTER — Other Ambulatory Visit (INDEPENDENT_AMBULATORY_CARE_PROVIDER_SITE_OTHER): Payer: Self-pay | Admitting: Internal Medicine

## 2019-08-06 DIAGNOSIS — Z87898 Personal history of other specified conditions: Secondary | ICD-10-CM

## 2019-08-06 LAB — HEPATIC FUNCTION PANEL
ALT: 18 U/L (ref 0–44)
AST: 26 U/L (ref 15–41)
Albumin: 4.9 g/dL (ref 3.5–5.0)
Alkaline Phosphatase: 83 U/L (ref 38–126)
Bilirubin, Direct: 0.2 mg/dL (ref 0.0–0.2)
Indirect Bilirubin: 5.6 mg/dL — ABNORMAL HIGH (ref 0.3–0.9)
Total Bilirubin: 5.8 mg/dL — ABNORMAL HIGH (ref 0.3–1.2)
Total Protein: 8 g/dL (ref 6.5–8.1)

## 2019-08-06 NOTE — Progress Notes (Signed)
Patient reports her eyes to be yellow yesterday.  She has backed off on Elavil. Sclera does not appear to be extracted. She has history of Gilbert's syndrome. Check LFTs.

## 2019-08-12 DIAGNOSIS — C20 Malignant neoplasm of rectum: Secondary | ICD-10-CM | POA: Diagnosis not present

## 2019-08-12 DIAGNOSIS — E063 Autoimmune thyroiditis: Secondary | ICD-10-CM | POA: Diagnosis not present

## 2019-08-12 DIAGNOSIS — C73 Malignant neoplasm of thyroid gland: Secondary | ICD-10-CM | POA: Diagnosis not present

## 2019-08-12 DIAGNOSIS — D511 Vitamin B12 deficiency anemia due to selective vitamin B12 malabsorption with proteinuria: Secondary | ICD-10-CM | POA: Diagnosis not present

## 2019-08-12 DIAGNOSIS — E860 Dehydration: Secondary | ICD-10-CM | POA: Diagnosis not present

## 2019-08-12 DIAGNOSIS — E890 Postprocedural hypothyroidism: Secondary | ICD-10-CM | POA: Diagnosis not present

## 2019-08-13 ENCOUNTER — Other Ambulatory Visit: Payer: Self-pay

## 2019-08-13 ENCOUNTER — Other Ambulatory Visit (HOSPITAL_COMMUNITY)
Admission: RE | Admit: 2019-08-13 | Discharge: 2019-08-13 | Disposition: A | Discharge status: Home / Self Care | Payer: 59 | Source: Ambulatory Visit | Attending: Hematology | Admitting: Hematology

## 2019-08-13 DIAGNOSIS — Z01812 Encounter for preprocedural laboratory examination: Secondary | ICD-10-CM | POA: Diagnosis not present

## 2019-08-13 DIAGNOSIS — Z20822 Contact with and (suspected) exposure to covid-19: Secondary | ICD-10-CM | POA: Insufficient documentation

## 2019-08-13 LAB — SARS CORONAVIRUS 2 (TAT 6-24 HRS): SARS Coronavirus 2: NEGATIVE

## 2019-08-15 DIAGNOSIS — Z87891 Personal history of nicotine dependence: Secondary | ICD-10-CM | POA: Diagnosis not present

## 2019-08-15 DIAGNOSIS — C787 Secondary malignant neoplasm of liver and intrahepatic bile duct: Secondary | ICD-10-CM | POA: Diagnosis not present

## 2019-08-15 DIAGNOSIS — C20 Malignant neoplasm of rectum: Secondary | ICD-10-CM | POA: Diagnosis not present

## 2019-08-15 DIAGNOSIS — Z8616 Personal history of COVID-19: Secondary | ICD-10-CM | POA: Diagnosis not present

## 2019-08-15 DIAGNOSIS — R188 Other ascites: Secondary | ICD-10-CM | POA: Diagnosis not present

## 2019-08-15 DIAGNOSIS — E890 Postprocedural hypothyroidism: Secondary | ICD-10-CM | POA: Diagnosis not present

## 2019-08-22 DIAGNOSIS — E860 Dehydration: Secondary | ICD-10-CM | POA: Diagnosis not present

## 2019-08-22 DIAGNOSIS — E063 Autoimmune thyroiditis: Secondary | ICD-10-CM | POA: Diagnosis not present

## 2019-08-22 DIAGNOSIS — E890 Postprocedural hypothyroidism: Secondary | ICD-10-CM | POA: Diagnosis not present

## 2019-08-22 DIAGNOSIS — C20 Malignant neoplasm of rectum: Secondary | ICD-10-CM | POA: Diagnosis not present

## 2019-08-22 DIAGNOSIS — D511 Vitamin B12 deficiency anemia due to selective vitamin B12 malabsorption with proteinuria: Secondary | ICD-10-CM | POA: Diagnosis not present

## 2019-08-22 DIAGNOSIS — C73 Malignant neoplasm of thyroid gland: Secondary | ICD-10-CM | POA: Diagnosis not present

## 2019-08-22 MED FILL — ARMOUR THYROID 60 MG TABLET: 60 | 30 days supply | Qty: 30 | Fill #3

## 2019-08-27 DIAGNOSIS — Z85048 Personal history of other malignant neoplasm of rectum, rectosigmoid junction, and anus: Secondary | ICD-10-CM | POA: Diagnosis not present

## 2019-08-29 DIAGNOSIS — C787 Secondary malignant neoplasm of liver and intrahepatic bile duct: Secondary | ICD-10-CM | POA: Diagnosis not present

## 2019-08-29 DIAGNOSIS — C20 Malignant neoplasm of rectum: Secondary | ICD-10-CM | POA: Diagnosis not present

## 2019-09-05 ENCOUNTER — Encounter (HOSPITAL_COMMUNITY)
Admission: RE | Admit: 2019-09-05 | Discharge: 2019-09-05 | Disposition: A | Discharge status: Home / Self Care | Payer: 59 | Source: Ambulatory Visit | Attending: Hematology | Admitting: Hematology

## 2019-09-05 ENCOUNTER — Other Ambulatory Visit: Payer: Self-pay

## 2019-09-05 DIAGNOSIS — Z452 Encounter for adjustment and management of vascular access device: Secondary | ICD-10-CM | POA: Insufficient documentation

## 2019-09-05 DIAGNOSIS — C785 Secondary malignant neoplasm of large intestine and rectum: Secondary | ICD-10-CM | POA: Insufficient documentation

## 2019-09-05 MED ORDER — HEPARIN SOD (PORK) LOCK FLUSH 100 UNIT/ML IV SOLN
500.0000 [IU] | Freq: Once | INTRAVENOUS | Status: AC
  Administered 2019-09-05: 500 [IU] via INTRAVENOUS

## 2019-09-12 MED FILL — DICYCLOMINE 10 MG CAPSULE: 10 | 30 days supply | Qty: 90 | Fill #1

## 2019-09-12 MED FILL — ALPRAZolam 0.5 MG TABS: 0.5 | 30 days supply | Qty: 90 | Fill #1

## 2019-09-12 MED FILL — AMITRIPTYLINE HCL 10 MG TAB: 10 | 30 days supply | Qty: 60 | Fill #1

## 2019-09-26 MED FILL — ARMOUR THYROID 60 MG TABLET: 60 | 90 days supply | Qty: 90 | Fill #0

## 2019-10-17 ENCOUNTER — Encounter (HOSPITAL_COMMUNITY): Admission: RE | Admit: 2019-10-17 | Payer: 59 | Source: Ambulatory Visit

## 2019-10-18 ENCOUNTER — Other Ambulatory Visit: Payer: Self-pay

## 2019-10-18 ENCOUNTER — Inpatient Hospital Stay (HOSPITAL_COMMUNITY): Payer: 59 | Attending: Hematology

## 2019-10-18 ENCOUNTER — Encounter (HOSPITAL_COMMUNITY)
Admission: RE | Admit: 2019-10-18 | Discharge: 2019-10-18 | Disposition: A | Discharge status: Home / Self Care | Payer: 59 | Source: Ambulatory Visit | Attending: General Surgery | Admitting: General Surgery

## 2019-10-18 DIAGNOSIS — I2510 Atherosclerotic heart disease of native coronary artery without angina pectoris: Secondary | ICD-10-CM | POA: Insufficient documentation

## 2019-10-18 DIAGNOSIS — I700 Atherosclerosis of aorta: Secondary | ICD-10-CM | POA: Insufficient documentation

## 2019-10-18 DIAGNOSIS — R5383 Other fatigue: Secondary | ICD-10-CM | POA: Insufficient documentation

## 2019-10-18 DIAGNOSIS — Z8249 Family history of ischemic heart disease and other diseases of the circulatory system: Secondary | ICD-10-CM | POA: Diagnosis not present

## 2019-10-18 DIAGNOSIS — C20 Malignant neoplasm of rectum: Secondary | ICD-10-CM | POA: Insufficient documentation

## 2019-10-18 DIAGNOSIS — Z801 Family history of malignant neoplasm of trachea, bronchus and lung: Secondary | ICD-10-CM | POA: Insufficient documentation

## 2019-10-18 DIAGNOSIS — C787 Secondary malignant neoplasm of liver and intrahepatic bile duct: Secondary | ICD-10-CM | POA: Insufficient documentation

## 2019-10-18 DIAGNOSIS — E039 Hypothyroidism, unspecified: Secondary | ICD-10-CM | POA: Insufficient documentation

## 2019-10-18 DIAGNOSIS — Z87891 Personal history of nicotine dependence: Secondary | ICD-10-CM | POA: Diagnosis not present

## 2019-10-18 DIAGNOSIS — R634 Abnormal weight loss: Secondary | ICD-10-CM | POA: Insufficient documentation

## 2019-10-18 DIAGNOSIS — R200 Anesthesia of skin: Secondary | ICD-10-CM | POA: Diagnosis not present

## 2019-10-18 DIAGNOSIS — R110 Nausea: Secondary | ICD-10-CM | POA: Insufficient documentation

## 2019-10-18 DIAGNOSIS — K5900 Constipation, unspecified: Secondary | ICD-10-CM | POA: Insufficient documentation

## 2019-10-18 DIAGNOSIS — Z8042 Family history of malignant neoplasm of prostate: Secondary | ICD-10-CM | POA: Diagnosis not present

## 2019-10-18 DIAGNOSIS — R197 Diarrhea, unspecified: Secondary | ICD-10-CM | POA: Insufficient documentation

## 2019-10-18 DIAGNOSIS — Z808 Family history of malignant neoplasm of other organs or systems: Secondary | ICD-10-CM | POA: Diagnosis not present

## 2019-10-18 DIAGNOSIS — Z79899 Other long term (current) drug therapy: Secondary | ICD-10-CM | POA: Insufficient documentation

## 2019-10-18 LAB — CBC WITH DIFFERENTIAL/PLATELET
Abs Immature Granulocytes: 0 10*3/uL (ref 0.00–0.07)
Basophils Absolute: 0 10*3/uL (ref 0.0–0.1)
Basophils Relative: 0 %
Eosinophils Absolute: 0 10*3/uL (ref 0.0–0.5)
Eosinophils Relative: 1 %
HCT: 32.1 % — ABNORMAL LOW (ref 36.0–46.0)
Hemoglobin: 11.2 g/dL — ABNORMAL LOW (ref 12.0–15.0)
Immature Granulocytes: 0 %
Lymphocytes Relative: 23 %
Lymphs Abs: 0.6 10*3/uL — ABNORMAL LOW (ref 0.7–4.0)
MCH: 33.9 pg (ref 26.0–34.0)
MCHC: 34.9 g/dL (ref 30.0–36.0)
MCV: 97.3 fL (ref 80.0–100.0)
Monocytes Absolute: 0.2 10*3/uL (ref 0.1–1.0)
Monocytes Relative: 6 %
Neutro Abs: 1.9 10*3/uL (ref 1.7–7.7)
Neutrophils Relative %: 70 %
Platelets: 82 10*3/uL — ABNORMAL LOW (ref 150–400)
RBC: 3.3 MIL/uL — ABNORMAL LOW (ref 3.87–5.11)
RDW: 13.2 % (ref 11.5–15.5)
WBC: 2.7 10*3/uL — ABNORMAL LOW (ref 4.0–10.5)
nRBC: 0 % (ref 0.0–0.2)

## 2019-10-18 LAB — COMPREHENSIVE METABOLIC PANEL
ALT: 16 U/L (ref 0–44)
AST: 25 U/L (ref 15–41)
Albumin: 4.3 g/dL (ref 3.5–5.0)
Alkaline Phosphatase: 97 U/L (ref 38–126)
Anion gap: 8 (ref 5–15)
BUN: 17 mg/dL (ref 6–20)
CO2: 26 mmol/L (ref 22–32)
Calcium: 8.9 mg/dL (ref 8.9–10.3)
Chloride: 105 mmol/L (ref 98–111)
Creatinine, Ser: 0.82 mg/dL (ref 0.44–1.00)
GFR calc Af Amer: >60 mL/min (ref >60)
GFR calc non Af Amer: >60 mL/min (ref >60)
Glucose, Bld: 112 mg/dL — ABNORMAL HIGH (ref 70–99)
Potassium: 3.3 mmol/L — ABNORMAL LOW (ref 3.5–5.1)
Sodium: 139 mmol/L (ref 135–145)
Total Bilirubin: 2.7 mg/dL — ABNORMAL HIGH (ref 0.3–1.2)
Total Protein: 7 g/dL (ref 6.5–8.1)

## 2019-10-18 LAB — MAGNESIUM: Magnesium: 2.1 mg/dL (ref 1.7–2.4)

## 2019-10-18 MED ORDER — HEPARIN SOD (PORK) LOCK FLUSH 100 UNIT/ML IV SOLN
500.0000 [IU] | Freq: Once | INTRAVENOUS | Status: AC
  Administered 2019-10-18: 500 [IU] via INTRAVENOUS

## 2019-10-19 LAB — CEA: CEA: 2.1 ng/mL (ref 0.0–4.7)

## 2019-10-21 ENCOUNTER — Other Ambulatory Visit: Payer: Self-pay

## 2019-10-21 ENCOUNTER — Ambulatory Visit (HOSPITAL_COMMUNITY)
Admission: RE | Admit: 2019-10-21 | Discharge: 2019-10-21 | Disposition: A | Discharge status: Home / Self Care | Payer: 59 | Source: Ambulatory Visit | Attending: Hematology | Admitting: Hematology

## 2019-10-21 DIAGNOSIS — C20 Malignant neoplasm of rectum: Secondary | ICD-10-CM | POA: Diagnosis not present

## 2019-10-21 DIAGNOSIS — R918 Other nonspecific abnormal finding of lung field: Secondary | ICD-10-CM | POA: Diagnosis not present

## 2019-10-21 DIAGNOSIS — K7689 Other specified diseases of liver: Secondary | ICD-10-CM | POA: Diagnosis not present

## 2019-10-21 MED ORDER — IOHEXOL 300 MG/ML  SOLN
100.0000 mL | Freq: Once | INTRAMUSCULAR | Status: AC | PRN
  Administered 2019-10-21: 100 mL via INTRAVENOUS

## 2019-10-22 ENCOUNTER — Inpatient Hospital Stay (HOSPITAL_BASED_OUTPATIENT_CLINIC_OR_DEPARTMENT_OTHER): Payer: 59 | Admitting: Hematology

## 2019-10-22 VITALS — BP 115/78 | HR 99 | Temp 97.5°F | Resp 18 | Wt 120.7 lb

## 2019-10-22 DIAGNOSIS — K5900 Constipation, unspecified: Secondary | ICD-10-CM | POA: Diagnosis not present

## 2019-10-22 DIAGNOSIS — E039 Hypothyroidism, unspecified: Secondary | ICD-10-CM | POA: Diagnosis not present

## 2019-10-22 DIAGNOSIS — R197 Diarrhea, unspecified: Secondary | ICD-10-CM | POA: Diagnosis not present

## 2019-10-22 DIAGNOSIS — R200 Anesthesia of skin: Secondary | ICD-10-CM | POA: Diagnosis not present

## 2019-10-22 DIAGNOSIS — C20 Malignant neoplasm of rectum: Secondary | ICD-10-CM | POA: Diagnosis not present

## 2019-10-22 DIAGNOSIS — R110 Nausea: Secondary | ICD-10-CM | POA: Diagnosis not present

## 2019-10-22 DIAGNOSIS — R5383 Other fatigue: Secondary | ICD-10-CM | POA: Diagnosis not present

## 2019-10-22 DIAGNOSIS — R634 Abnormal weight loss: Secondary | ICD-10-CM | POA: Diagnosis not present

## 2019-10-22 NOTE — Patient Instructions (Signed)
[facility] at [facility] Discharge Instructions  You were seen today by Dr. Katragadda. He went over your recent results. Dr. Katragadda will see you back in for labs and follow up.   Thank you for choosing [facility] at [facility] to provide your oncology and hematology care.  To afford each patient quality time with our provider, please arrive at least 15 minutes before your scheduled appointment time.   If you have a lab appointment with the Cancer Center please come in thru the Main Entrance and check in at the main information desk  You need to re-schedule your appointment should you arrive 10 or more minutes late.  We strive to give you quality time with our providers, and arriving late affects you and other patients whose appointments are after yours.  Also, if you no show three or more times for appointments you may be dismissed from the clinic at the providers discretion.     Again, thank you for choosing [facility].  Our hope is that these requests will decrease the amount of time that you wait before being seen by our physicians.       _____________________________________________________________  Should you have questions after your visit to [facility], please contact our office at [phone] between the hours of 8:00 a.m. and 4:30 p.m.  Voicemails left after 4:00 p.m. will not be returned until the following business day.  For prescription refill requests, have your pharmacy contact our office and allow 72 hours.    Cancer Center Support Programs:   > Cancer Support Group  2nd Tuesday of the month 1pm-2pm, Journey Room    

## 2019-10-22 NOTE — Progress Notes (Signed)
[place], [place] [id number]   CLINIC:  Medical Oncology/Hematology  PCP:  Samantha Drown, MD [place] / [place] [address] [phone]   REASON FOR VISIT:  Follow-up for metastatic rectal cancer  CURRENT THERAPY: Observation.  BRIEF ONCOLOGIC HISTORY:  Oncology History  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[place])  02/26/2018 Initial Diagnosis   Rectal cancer ([place])   03/13/2018 Cancer Staging   Staging form: Colon and Rectum, AJCC 8th Edition - Clinical stage from 03/13/2018: Stage IVA (cT3, cN1b, cM1a) - Signed by Derek Jack, MD on 03/13/2018   03/14/2018 - 07/10/2018 Chemotherapy   The patient had palonosetron (ALOXI) injection 0.25 mg, 0.25 mg, Intravenous,  Once, 7 of 8 cycles Administration: 0.25 mg (03/14/2018), 0.25 mg (03/27/2018), 0.25 mg (04/18/2018), 0.25 mg (05/02/2018), 0.25 mg (05/16/2018), 0.25 mg (06/05/2018), 0.25 mg (06/27/2018) leucovorin 800 mg in dextrose 5 % 250 mL infusion, 772 mg, Intravenous,  Once, 7 of 8 cycles Administration: 800 mg (03/14/2018), 800 mg (03/27/2018), 800 mg (05/02/2018), 800 mg (05/16/2018), 700 mg (04/18/2018), 800 mg (06/05/2018), 800 mg (06/27/2018) oxaliplatin (ELOXATIN) 165 mg in dextrose 5 % 500 mL chemo infusion, 85 mg/m2 = 165 mg, Intravenous,  Once, 6 of 7 cycles Dose modification: 68 mg/m2 (80 % of original dose 85 mg/m2, Cycle 4, Reason: Provider Judgment) Administration: 165 mg (03/14/2018), 165 mg (03/27/2018), 130 mg (05/02/2018), 130 mg (05/16/2018), 130 mg (06/05/2018), 130 mg (06/27/2018) panitumumab (VECTIBIX) 500 mg in sodium chloride 0.9 % 100 mL chemo infusion, 480 mg, Intravenous,  Once, 4 of 5 cycles Administration: 500 mg (04/18/2018), 480 mg (05/02/2018), 480 mg (05/16/2018), 480 mg (06/05/2018) fluorouracil (ADRUCIL) chemo injection 750 mg, 400 mg/m2 = 750 mg (100 % of original dose 400 mg/m2), Intravenous,  Once, 6 of 7 cycles Dose modification: 400 mg/m2 (original dose 400  mg/m2, Cycle 1), 400 mg/m2 (original dose 400 mg/m2, Cycle 3) Administration: 750 mg (03/14/2018), 750 mg (04/18/2018), 750 mg (05/02/2018), 750 mg (05/16/2018), 750 mg (06/05/2018), 750 mg (06/27/2018) fosaprepitant (EMEND) 150 mg, dexamethasone (DECADRON) 12 mg in sodium chloride 0.9 % 145 mL IVPB, , Intravenous,  Once, 4 of 5 cycles Administration:  (05/02/2018),  (05/16/2018),  (06/05/2018),  (06/27/2018) fluorouracil (ADRUCIL) 4,650 mg in sodium chloride 0.9 % 57 mL chemo infusion, 2,400 mg/m2 = 4,650 mg, Intravenous, 1 Day/Dose, 7 of 8 cycles Administration: 4,650 mg (03/14/2018), 4,650 mg (03/27/2018), 4,650 mg (04/18/2018), 4,650 mg (05/02/2018), 4,650 mg (05/16/2018), 4,650 mg (06/05/2018), 4,650 mg (06/27/2018)  for chemotherapy treatment.      CANCER STAGING: Cancer Staging Malignant neoplasm of rectum [facility]) Staging form: Colon and Rectum, AJCC 8th Edition - Clinical stage from 03/13/2018: Stage IVA (cT3, cN1b, cM1a) - Signed by Derek Jack, MD on 03/13/2018   INTERVAL HISTORY:  [name], a 54 y.o. female, returns for routine follow-up of her metastatic rectal cancer. Samantha Reynolds was last seen on 07/22/2019.   She notes that her appetite has improved some. She feels like her appetite decreased overall after her ablation because of the anaesthesia. She had a lot of issues with her bowels post ablation as well. She has some anxiety about her treatment options, but she is agreeable to her different options.    She had some days this week where she didn't feel well; there wasn't any one specific thing wrong, she just didn't feel great. She didn't take any medication to treat.    REVIEW OF SYSTEMS:  Review of Systems  Constitutional:  Positive for appetite change (mildly decreased) and fatigue (mild).  HENT:  Negative.   Eyes: Negative.   Respiratory: Negative.   Cardiovascular: Negative.   Gastrointestinal: Positive for constipation, diarrhea and nausea.  Endocrine: Negative.     Genitourinary: Negative.    Musculoskeletal: Negative.   Skin: Negative.   Neurological: Positive for numbness.  Hematological: Negative.   Psychiatric/Behavioral: Negative.   All other systems reviewed and are negative.   PAST MEDICAL/SURGICAL HISTORY:  Past Medical History:  Diagnosis Date  . Anxiety   . Cancer [facility]) 2013   thyroid  . Endometrial polyp   . Endometrial thickening on ultra sound   . GERD (gastroesophageal reflux disease)   . 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 H/O Hashimoto thyroiditis   . History of chemotherapy   . History of radiation therapy   . 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 Hyperlipidemia 11/09/2015  . Hypothyroidism   . 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[place]) 02/26/2018   Past Surgical History:  Procedure Laterality Date  . BIOPSY  02/19/2018   Procedure: BIOPSY;  Surgeon: Rogene Houston, MD;  [facility];  Service: Endoscopy;;  rectum  . COLONOSCOPY N/A 08/20/2015   Procedure: COLONOSCOPY;  Surgeon: Rogene Houston, MD;  [facility];  Service: Endoscopy;  Laterality: N/A;  730  . [place] ENDOMETRIAL ABLATION  08-13-2004  . DIVERTING ILEOSTOMY N/A 12/07/2018   Procedure: DIVERTING LOOP ILEOSTOMY;  Surgeon: Leighton Ruff, MD;  Location: [facility];  Service: General;  Laterality: N/A;  . FLEXIBLE SIGMOIDOSCOPY N/A 02/19/2018   Procedure: FLEXIBLE SIGMOIDOSCOPY;  Surgeon: Rogene Houston, MD;  [facility];  Service: Endoscopy;  Laterality: N/A;  . HYSTEROSCOPY WITH D & C N/A 04/30/2015   Procedure: DILATATION AND CURETTAGE / INTENDED HYSTEROSCOPY;  Surgeon: Dian Queen, MD;  Location: [place];  Service: Gynecology;  Laterality: N/A;  . ILEOSTOMY CLOSURE N/A 04/17/2019   Procedure: LOOP ILEOSTOMY REVERSAL;  Surgeon: Leighton Ruff, MD;   [facility];  Service: General;  Laterality: N/A;  . IR US GUIDE BX ASP/DRAIN  03/09/2018  . LAPAROSCOPIC CHOLECYSTECTOMY  04/1996  . LAPAROSCOPY N/A 12/11/2018   Procedure: LAPAROSCOPY  ILEOSTOMY REVISION AND ABDOMINAL WASHOUT;  Surgeon: Leighton Ruff, MD;  [facility];  Service: General;  Laterality: N/A;  . Liver Microwave   10/17/2018  . POLYPECTOMY  08/20/2015   Procedure: POLYPECTOMY;  Surgeon: Rogene Houston, MD;  [facility];  Service: Endoscopy;;  Splenic flexure polypectomy  . POLYPECTOMY  02/19/2018   Procedure: POLYPECTOMY;  Surgeon: Rogene Houston, MD;  [facility];  Service: Endoscopy;;  rectum  . PORTACATH PLACEMENT N/A 03/14/2018   Procedure: INSERTION PORT-A-CATH;  Surgeon: Aviva Signs, MD;  Location: [facility];  Service: General;  Laterality: N/A;  . REDUCTION INCARCERATED UTERUS  06-20-2000   intrauterine preg. 13 wks /  urinary retention  . THYROID LOBECTOMY  11/24/2011   Procedure: THYROID LOBECTOMY;  Surgeon: Earnstine Regal, MD;  [facility];  Service: General;  Laterality: Left;  Left Thyroid Lobectomy  . TUBAL LIGATION  2002  . XI ROBOTIC ASSISTED LOWER ANTERIOR RESECTION N/A 12/07/2018   Procedure: XI ROBOTIC ASSISTED LOWER ANTERIOR RESECTION, SPENIC FLEXURE IMMOBILIZATION, COLOANAL ANASTOMOSIS, RIGID PROCTOSCOPY;  Surgeon: Leighton Ruff, MD;  [facility];  Service: General;  Laterality: N/A;    SOCIAL HISTORY:  Social History   Socioeconomic History  . Marital status: Married    Spouse name: Not on file  . Number of children: 4  . Years of education: Not on file  . 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 Smoking status: Former Smoker    Packs/day: 0.25    Years: 10.00    Pack years: 2.50    Types: Cigarettes    Quit date: 10/31/1991    Years since quitting: 27.9  . Smokeless tobacco: Never Used  Vaping Use  . Vaping Use: Never used    Substance and Sexual Activity  . Alcohol use: No  . Drug use: No  . Sexual activity: Not Currently  Other Topics Concern  . Not on file  Social History Narrative   Lives with husband [name] and 20 year old son [name]   Husband is Interior and spatial designer of [employer]   Social Determinants of Health   Financial Resource Strain:   . Difficulty of Paying Living Expenses:   Food Insecurity:   . Worried About Charity fundraiser in the Last Year:   . Arboriculturist in the Last Year:   Transportation Needs:   . Film/video editor (Medical):   Marland Kitchen Lack of Transportation (Non-Medical):   Physical Activity:   . Days of Exercise per Week:   . Minutes of Exercise per Session:   Stress:   . Feeling of Stress :   Social Connections:   . Frequency of Communication with Friends and Family:   . Frequency of Social Gatherings with Friends and Family:   . Attends Religious Services:   . Active Member of Clubs or Organizations:   . Attends Archivist Meetings:   Marland Kitchen Marital Status:   Intimate Partner Violence:   . Fear of Current or Ex-Partner:   . Emotionally Abused:   Marland Kitchen Physically Abused:   . Sexually Abused:     FAMILY HISTORY:  Family History  Problem Relation Age of Onset  . Hypertension Mother   . Hypertension Father   . Prostate cancer Father   . Cancer Maternal Aunt        spine/back  . Cancer Paternal Grandfather        lung  . Healthy Son   . Healthy Son   . Healthy Son   . Healthy Son   . Colon cancer Neg Hx     CURRENT MEDICATIONS:  Current Outpatient Medications  Medication Sig Dispense Refill  . ALPRAZolam (XANAX) 0.5 MG tablet Take one tablet tid 90 tablet 5  . amitriptyline (ELAVIL) 10 MG tablet Take 1 tablet (10 mg total) by mouth in the morning and at bedtime. 60 tablet 2  . dicyclomine (BENTYL) 10 MG capsule Take 1 capsule (10 mg total) by mouth 3 (three) times daily before meals. 90 capsule 5  . fluticasone (CUTIVATE) 0.05 % cream Apply 1 application  topically daily as needed (irritatiton).    Marland Kitchen loperamide (IMODIUM) 2 MG capsule Take 1 capsule (2 mg total) by mouth 3 (three) times daily. 30 capsule 0  . Multiple Vitamin (MULTI-VITAMIN) tablet Take by mouth.    Marland Kitchen OVER THE COUNTER MEDICATION Multivitamin w/iron    . thyroid (ARMOUR THYROID) 30 MG tablet Take 1 tablet (30 mg total) by mouth daily before breakfast. Take  one '30mg'$  tablet along with a '15mg'$  tablet daily (Patient taking differently: Take 60 mg by mouth daily before breakfast. ) 30 tablet 1  . lidocaine-prilocaine (EMLA) cream Apply small amount to port site one hour prior to appointment and cover with plastic wrap. (Patient not taking: Reported on 10/22/2019) 30 g 3  . ondansetron (ZOFRAN) 4 MG tablet Take 1 tablet (4 mg total) by mouth every 8 (eight) hours as needed for nausea or vomiting. (Patient not taking: Reported on 10/22/2019) 30 tablet 2  . prochlorperazine (COMPAZINE) 10 MG tablet Take 10 mg by mouth every 6 (six) hours as needed for nausea or vomiting. (Patient not taking: Reported on 10/22/2019)     No current facility-administered medications for this visit.   Facility-Administered Medications Ordered in Other Visits  Medication Dose Route Frequency Provider Last Rate Last Admin  . clindamycin (CLEOCIN) 900 mg in dextrose 5 % 50 mL IVPB  900 mg Intravenous 60 min Pre-Op Leighton Ruff, MD       And  . gentamicin (GARAMYCIN) 350 mg in dextrose 5 % 50 mL IVPB  5 mg/kg Intravenous 60 min Pre-Op Leighton Ruff, MD      . clindamycin (CLEOCIN) 900 mg in dextrose 5 % 50 mL IVPB  900 mg Intravenous 60 min Pre-Op Leighton Ruff, MD       And  . gentamicin (GARAMYCIN) 5 mg/kg in dextrose 5 % 50 mL IVPB  5 mg/kg Intravenous 60 min Pre-Op Leighton Ruff, MD      . sodium chloride 0.9 % 1,000 mL with potassium chloride 20 mEq, magnesium sulfate 2 g infusion   Intravenous Once Derek Jack, MD        ALLERGIES:  Allergies  Allergen Reactions  . Demerol [Meperidine] Itching      All over the body.  . Penicillins Hives     childhood, does not remember if it spread all over the body or not. Did it involve swelling of the face/tongue/throat, SOB, or low BP? No Did it involve sudden or severe rash/hives, skin peeling, or any reaction on the inside of your mouth or nose? Yes Did you need to seek medical attention at a hospital or doctor's office? Yes When did it last happen?childhood allergy If all above answers are "NO", may proceed with cephalosporin use.   . Other Hives and Other (See Comments)    Fresh coconut   . Vancomycin Rash    Will need Benadryl prior to administration per IV. "Red man syndrome"    PHYSICAL EXAM:  Performance status (ECOG): 0 - Asymptomatic  Vitals:   10/22/19 1604  BP: 115/78  Pulse: 99  Resp: 18  Temp: (!) 97.5 F (36.4 C)  SpO2: 94%   Wt Readings from Last 3 Encounters:  10/22/19 120 lb 11.2 oz (54.7 kg)  07/22/19 125 lb 12.8 oz (57.1 kg)  07/15/19 125 lb 4.8 oz (56.8 kg)   Physical Exam Vitals and nursing note reviewed.  Constitutional:      Appearance: Normal appearance.  HENT:     Mouth/Throat:     Mouth: Mucous membranes are moist.  Eyes:     Pupils: Pupils are equal, round, and reactive to light.  Cardiovascular:     Rate and Rhythm: Normal rate and regular rhythm.     Pulses: Normal pulses.     Heart sounds: Normal heart sounds.  Pulmonary:     Effort: Pulmonary effort is normal.     Breath sounds: Normal breath sounds.  Abdominal:  Palpations: Abdomen is soft. There is no mass.     Tenderness: There is no abdominal tenderness.  Musculoskeletal:     Cervical back: Neck supple.     Right lower leg: No edema.     Left lower leg: No edema.  Neurological:     Mental Status: She is alert and oriented to person, place, and time.  Psychiatric:        Mood and Affect: Mood normal.        Behavior: Behavior normal.      LABORATORY DATA:  I have reviewed the labs as listed.  CBC Latest Ref Rng  & Units 10/18/2019 07/10/2019 04/20/2019  WBC 4.0 - 10.5 K/uL 2.7(L) 4.3 5.0  Hemoglobin 12.0 - 15.0 g/dL 11.2(L) 13.0 9.5(L)  Hematocrit 36 - 46 % 32.1(L) 38.8 27.4(L)  Platelets 150 - 400 K/uL 82(L) 139(L) 94(L)   CMP Latest Ref Rng & Units 10/18/2019 08/06/2019 07/10/2019  Glucose 70 - 99 mg/dL 112(H) - 92  BUN 6 - 20 mg/dL 17 - 14  Creatinine 0.44 - 1.00 mg/dL 0.82 - 0.82  Sodium 135 - 145 mmol/L 139 - 137  Potassium 3.5 - 5.1 mmol/L 3.3(L) - 3.2(L)  Chloride 98 - 111 mmol/L 105 - 99  CO2 22 - 32 mmol/L 26 - 27  Calcium 8.9 - 10.3 mg/dL 8.9 - 9.7  Total Protein 6.5 - 8.1 g/dL 7.0 8.0 7.8  Total Bilirubin 0.3 - 1.2 mg/dL 2.7(H) 5.8(H) 5.1(H)  Alkaline Phos 38 - 126 U/L 97 83 83  AST 15 - 41 U/L '25 26 28  '$ ALT 0 - 44 U/L '16 18 19    '$ DIAGNOSTIC IMAGING:  I have independently reviewed the scans and discussed with the patient. CT Chest W Contrast  Result Date: 10/21/2019 CLINICAL DATA:  History of rectal cancer post ablation of liver mass, status post colonic resection with chemo and radiotherapy. Also with history of thyroid cancer. EXAM: CT CHEST, ABDOMEN, AND PELVIS WITH CONTRAST TECHNIQUE: Multidetector CT imaging of the chest, abdomen and pelvis was performed following the standard protocol during bolus administration of intravenous contrast. CONTRAST:  162m OMNIPAQUE IOHEXOL 300 MG/ML  SOLN COMPARISON:  07/10/2019 FINDINGS: CT CHEST FINDINGS Cardiovascular: Ascending aortic aneurysmal dilation 4.6 cm unchanged. LEFT-sided Port-A-Cath in situ. Tip in the mid superior vena cava as before. Heart size is normal. No pericardial effusion. Central pulmonary vasculature unremarkable on venous phase assessment. Mediastinum/Nodes: Thoracic inlet structures with signs of thyroidectomy. No adenopathy. No axillary adenopathy. No mediastinal lymphadenopathy. Esophagus is grossly normal. Lungs/Pleura: LEFT lower lobe pulmonary nodule increasing in size (image 149, series 4) 12 x 8 mm previously 9 x 6 mm.  RIGHT upper lobe nodule (image 37, series 4) 5 mm previously approximately 4 mm. No consolidation. No pleural effusion. Airways are patent. Musculoskeletal: See below for full musculoskeletal details. CT ABDOMEN PELVIS FINDINGS Hepatobiliary: Post treatment changes related to interval microwave ablation in the RIGHT hepatic lobe, (image 72, series 2) 2.6 x 2.0 cm at the site of previous lesion in hepatic subsegment VI. Lesion in the posterior RIGHT hemi liver along the boundary of hepatic subsegment VI and VII 3.5 x 3.0 cm reflecting post treatment changes in the area of previously described lesion. New lesion in the LEFT hepatic lobe, hepatic subsegment III (image 68, series 2) 13 x 10 mm. Very mild intrahepatic biliary ductal distension and extrahepatic biliary ductal distension following cholecystectomy. Pancreas: Pancreas is normal without focal lesion, ductal dilation or inflammation. Spleen: Vague area of low  attenuation in the peripheral spleen is unchanged measuring approximately 2.5 cm greatest axial dimension similar appearance on the study of September 27, 2018. Spleen moderately enlarged similar to the previous exam. 14 cm greatest craniocaudal dimension. Adrenals/Urinary Tract: Adrenal glands are normal. Kidneys enhance symmetrically without suspicious focal abnormality. No hydronephrosis.  Urinary bladder under distended. Stomach/Bowel: Gastrointestinal tract without acute process. Small bowel nondistended. Postoperative changes of partial colonic resection with low rectal anastomosis just above the pelvic floor with similar appearance. Site of prior ostomy noted in the RIGHT hemiabdomen with similar appearance. Vascular/Lymphatic: Vascular structures in the abdomen are patent. Scattered atheromatous plaque in the abdominal aorta. No aneurysm. No adenopathy. No pelvic lymphadenopathy. Reproductive: Grossly normal by CT.  No adnexal masses. Other: No hernia. No ascites. Stranding in the body wall related to  prior ostomy site. Musculoskeletal: No acute bone finding. No destructive bone process. IMPRESSION: 1. Pulmonary nodules in the chest, largest in the LEFT lower lobe has enlarged since the prior study, suspicious for metastatic disease or primary lung cancer as described. 2. Interval microwave ablation of lesions in the RIGHT hepatic lobe with NEW LEFT hepatic lobe lesion compatible with additional site of metastasis as discussed. 3. Stable low-density splenic lesion with splenomegaly. Low-density splenic lesion likely benign and unchanged over time. 4. Ascending aortic aneurysmal dilation 4.6 cm unchanged. 5. Stable post treatment and postoperative changes in the pelvis. 6. Aortic atherosclerosis. These results will be called to the ordering clinician or representative by the Radiologist Assistant, and communication documented in the PACS or [employer]. Aortic Atherosclerosis (ICD10-I70.0). Electronically Signed   By: Donzetta Kohut M.D.   On: 10/21/2019 11:11   CT Abdomen Pelvis W Contrast  Result Date: 10/21/2019 CLINICAL DATA:  History of rectal cancer post ablation of liver mass, status post colonic resection with chemo and radiotherapy. Also with history of thyroid cancer. EXAM: CT CHEST, ABDOMEN, AND PELVIS WITH CONTRAST TECHNIQUE: Multidetector CT imaging of the chest, abdomen and pelvis was performed following the standard protocol during bolus administration of intravenous contrast. CONTRAST:  OMNIPAQUE IOHEXOL 300 MG/ML  SOLN COMPARISON:  07/10/2019 FINDINGS: CT CHEST FINDINGS Cardiovascular: Ascending aortic aneurysmal dilation 4.6 cm unchanged. LEFT-sided Port-A-Cath in situ. Tip in the mid superior vena cava as before. Heart size is normal. No pericardial effusion. Central pulmonary vasculature unremarkable on venous phase assessment. Mediastinum/Nodes: Thoracic inlet structures with signs of thyroidectomy. No adenopathy. No axillary adenopathy. No mediastinal lymphadenopathy. Esophagus  is grossly normal. Lungs/Pleura: LEFT lower lobe pulmonary nodule increasing in size (image 149, series 4) 12 x 8 mm previously 9 x 6 mm. RIGHT upper lobe nodule (image 37, series 4) 5 mm previously approximately 4 mm. No consolidation. No pleural effusion. Airways are patent. Musculoskeletal: See below for full musculoskeletal details. CT ABDOMEN PELVIS FINDINGS Hepatobiliary: Post treatment changes related to interval microwave ablation in the RIGHT hepatic lobe, (image 72, series 2) 2.6 x 2.0 cm at the site of previous lesion in hepatic subsegment VI. Lesion in the posterior RIGHT hemi liver along the boundary of hepatic subsegment VI and VII 3.5 x 3.0 cm reflecting post treatment changes in the area of previously described lesion. New lesion in the LEFT hepatic lobe, hepatic subsegment III (image 68, series 2) 13 x 10 mm. Very mild intrahepatic biliary ductal distension and extrahepatic biliary ductal distension following cholecystectomy. Pancreas: Pancreas is normal without focal lesion, ductal dilation or inflammation. Spleen: Vague area of low attenuation in the peripheral spleen is unchanged measuring approximately 2.5 cm greatest  axial dimension similar appearance on the study of September 27, 2018. Spleen moderately enlarged similar to the previous exam. 14 cm greatest craniocaudal dimension. Adrenals/Urinary Tract: Adrenal glands are normal. Kidneys enhance symmetrically without suspicious focal abnormality. No hydronephrosis.  Urinary bladder under distended. Stomach/Bowel: Gastrointestinal tract without acute process. Small bowel nondistended. Postoperative changes of partial colonic resection with low rectal anastomosis just above the pelvic floor with similar appearance. Site of prior ostomy noted in the RIGHT hemiabdomen with similar appearance. Vascular/Lymphatic: Vascular structures in the abdomen are patent. Scattered atheromatous plaque in the abdominal aorta. No aneurysm. No adenopathy. No pelvic  lymphadenopathy. Reproductive: Grossly normal by CT.  No adnexal masses. Other: No hernia. No ascites. Stranding in the body wall related to prior ostomy site. Musculoskeletal: No acute bone finding. No destructive bone process. IMPRESSION: 1. Pulmonary nodules in the chest, largest in the LEFT lower lobe has enlarged since the prior study, suspicious for metastatic disease or primary lung cancer as described. 2. Interval microwave ablation of lesions in the RIGHT hepatic lobe with NEW LEFT hepatic lobe lesion compatible with additional site of metastasis as discussed. 3. Stable low-density splenic lesion with splenomegaly. Low-density splenic lesion likely benign and unchanged over time. 4. Ascending aortic aneurysmal dilation 4.6 cm unchanged. 5. Stable post treatment and postoperative changes in the pelvis. 6. Aortic atherosclerosis. These results will be called to the ordering clinician or representative by the Radiologist Assistant, and communication documented in the PACS or [employer]. Aortic Atherosclerosis (ICD10-I70.0). Electronically Signed   By: Zetta Bills M.D.   On: 10/21/2019 11:11     ASSESSMENT:  1.  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  Liver biopsy on 03/09/2018 consistent with adenocarcinoma. 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 -7 cycles of FOLFOX with vectibix completed on 06/27/2018. -XRT with Xeloda from 07/30/2018 through 09/06/2018. -Right liver lesion microwave [facility] on 10/15/2018 [facility]. -Low anterior resection and diverting ileostomy on 12/07/2018, pathology [id number], 0/6 lymph nodes positive, margins negative. -Loop ileostomy reversal on 04/17/2019. -CTAP on 07/10/2019 showed left lower lobe pulmonary nodule increased by 1 mm, measuring 8 mm.  3 mm left upper lobe lung nodule is unchanged.  Probable 4 mm right apical pulmonary nodule felt to be enlarged from 2 mm from  October 2020.  Vague hypoattenuation within the posterior right hepatic lobe measures 1.5 cm, measuring 1.2 cm in October 2020.  Subtle subcapsular right hepatic lobe hypoattenuating 1 cm lesion was not seen on prior scans. -We reviewed results of MRI of the abdomen with and without contrast from 07/16/2019.  2 enhancing lesions in the right hepatic lobe, largest one measuring 1.5 x 1.0 cm.  Inferior lesion is measured as 1 cm.  Stable splenomegaly. -CEA is 2.4. -Microwave ablation of the right hepatic lobe lesion by Dr. Maudie Mercury around 08/15/2019.  2.  Hyperbilirubinemia: -Total bilirubin is 5.1.  She has Gilbert's syndrome.  Baseline is between 2 and 3.  3.  Hypothyroidism: -She is on [place] Thyroid.  TSH is 3.6.  4.  Weight loss: -She had 10 pound weight loss since her last visit 3 months ago.  However she had ileostomy reversal and is adjusting to new food habits.   PLAN:  1.  Stage IV rectal adenocarcinoma: -She had microwave ablation of the right hepatic lobe lesion by Dr. Maudie Mercury at [facility] around 15 Aug 2019. -We reviewed labs from 10/18/2019.  CEA was 2.1. -Reviewed CT CAP from 10/21/2019.  I  have compared it with previous scans and reviewed the images with the patient and her husband. -New lesion in the left hepatic lobe measures 13 x 10 mm.  Posttreatment changes related to ablation of the right hepatic lobe.  Lesion in the posterior right hemiliver along the boundary of her body supplements 7 and 8 also shows posttreatment changes. -We will upload the films for Dr. Maudie Mercury at [facility] for possible ablation procedure. -Left lower lobe pulmonary nodule has increased in size to 12 mm, from 9 mm.  Right upper lobe lung nodule measures 4 to 5 mm and stable. -I think the pulmonary nodule in the left lower lobe has been growing slowly but steadily.  SBRT to this lesion is a reasonable option. -I will present her case at GI tumor board next week. -I plan to repeat her CT CAP in 3 months.  2.   Hyperbilirubinemia: -She has Gilbert's syndrome.  Her total bilirubin has improved to 2.7.  3.  Weight loss: -She is eating 3 meals but is not gaining weight.  I have recommended Ensure Enlive 1 can every day along with meals.  4.  Hypothyroidism: -Continue Armour Thyroid.    Orders placed this encounter:  No orders of the defined types were placed in this encounter.    Derek Jack, MD [facility] [phone]   I, Jacqualyn Posey, am acting as a scribe for Dr. Sanda Linger.  I, Derek Jack MD, have reviewed the above documentation for accuracy and completeness, and I agree with the above.

## 2019-10-23 ENCOUNTER — Encounter (HOSPITAL_COMMUNITY): Payer: Self-pay

## 2019-10-24 ENCOUNTER — Other Ambulatory Visit: Payer: Self-pay | Admitting: Nurse Practitioner

## 2019-10-24 ENCOUNTER — Other Ambulatory Visit (HOSPITAL_COMMUNITY): Payer: Self-pay | Admitting: Nurse Practitioner

## 2019-10-24 DIAGNOSIS — C787 Secondary malignant neoplasm of liver and intrahepatic bile duct: Secondary | ICD-10-CM

## 2019-10-25 ENCOUNTER — Ambulatory Visit (HOSPITAL_COMMUNITY)
Admission: RE | Admit: 2019-10-25 | Discharge: 2019-10-25 | Disposition: A | Discharge status: Home / Self Care | Payer: 59 | Source: Ambulatory Visit | Attending: Nurse Practitioner | Admitting: Nurse Practitioner

## 2019-10-25 ENCOUNTER — Other Ambulatory Visit: Payer: Self-pay

## 2019-10-25 DIAGNOSIS — C189 Malignant neoplasm of colon, unspecified: Secondary | ICD-10-CM | POA: Diagnosis not present

## 2019-10-25 DIAGNOSIS — C787 Secondary malignant neoplasm of liver and intrahepatic bile duct: Secondary | ICD-10-CM | POA: Diagnosis not present

## 2019-10-30 ENCOUNTER — Other Ambulatory Visit: Payer: Self-pay

## 2019-10-30 ENCOUNTER — Encounter (HOSPITAL_COMMUNITY): Payer: Self-pay

## 2019-10-30 MED FILL — DICYCLOMINE 10 MG CAPSULE: 10 | 30 days supply | Qty: 90 | Fill #2

## 2019-10-30 MED FILL — ALPRAZolam 0.5 MG TABS: 0.5 | 30 days supply | Qty: 90 | Fill #2

## 2019-10-30 MED FILL — AMITRIPTYLINE HCL 10 MG TAB: 10 | 30 days supply | Qty: 60 | Fill #2

## 2019-10-30 NOTE — Progress Notes (Signed)
I spoke with patient regarding Tumor Board Presentation. Patient has an appointment with RadOnc scheduled. Patient understanding of rationale for proceeding with RadOnc referral without tissue biopsy on lung nodule. Patient reports upcoming appointment with [facility]. Dr. Delton Coombes aware.

## 2019-11-04 ENCOUNTER — Other Ambulatory Visit: Payer: Self-pay | Admitting: Nurse Practitioner

## 2019-11-04 ENCOUNTER — Encounter: Payer: Self-pay | Admitting: Radiation Oncology

## 2019-11-04 ENCOUNTER — Other Ambulatory Visit (HOSPITAL_COMMUNITY): Payer: Self-pay | Admitting: Nurse Practitioner

## 2019-11-04 ENCOUNTER — Telehealth: Payer: Self-pay

## 2019-11-04 ENCOUNTER — Other Ambulatory Visit: Payer: Self-pay

## 2019-11-04 DIAGNOSIS — C20 Malignant neoplasm of rectum: Secondary | ICD-10-CM | POA: Diagnosis not present

## 2019-11-04 DIAGNOSIS — C787 Secondary malignant neoplasm of liver and intrahepatic bile duct: Secondary | ICD-10-CM

## 2019-11-04 DIAGNOSIS — Z01818 Encounter for other preprocedural examination: Secondary | ICD-10-CM

## 2019-11-04 NOTE — Telephone Encounter (Signed)
?  Spoke with patient in regards to telephone follow-up with Shona Simpson PA on 11/05/19 at 9:30am. Patient verbalized understanding of appointment date and time. Meaningful use questions were reviewed. TM

## 2019-11-04 NOTE — Telephone Encounter (Signed)
Left voicemail message to call back in regards to telephone visit appointment tomorrow with Shona Simpson PA at 9:30am. Called to review meaningful use questions. TM

## 2019-11-05 ENCOUNTER — Ambulatory Visit
Admission: RE | Admit: 2019-11-05 | Discharge: 2019-11-05 | Disposition: A | Discharge status: Home / Self Care | Payer: 59 | Source: Ambulatory Visit | Attending: Radiation Oncology | Admitting: Radiation Oncology

## 2019-11-05 ENCOUNTER — Other Ambulatory Visit: Payer: Self-pay

## 2019-11-05 ENCOUNTER — Ambulatory Visit (HOSPITAL_COMMUNITY)
Admission: RE | Admit: 2019-11-05 | Discharge: 2019-11-05 | Disposition: A | Discharge status: Home / Self Care | Payer: 59 | Source: Ambulatory Visit | Attending: Nurse Practitioner | Admitting: Nurse Practitioner

## 2019-11-05 DIAGNOSIS — Z85048 Personal history of other malignant neoplasm of rectum, rectosigmoid junction, and anus: Secondary | ICD-10-CM | POA: Diagnosis not present

## 2019-11-05 DIAGNOSIS — C20 Malignant neoplasm of rectum: Secondary | ICD-10-CM

## 2019-11-05 DIAGNOSIS — D7389 Other diseases of spleen: Secondary | ICD-10-CM | POA: Diagnosis not present

## 2019-11-05 DIAGNOSIS — C787 Secondary malignant neoplasm of liver and intrahepatic bile duct: Secondary | ICD-10-CM | POA: Insufficient documentation

## 2019-11-05 DIAGNOSIS — C7802 Secondary malignant neoplasm of left lung: Secondary | ICD-10-CM | POA: Diagnosis not present

## 2019-11-05 DIAGNOSIS — D181 Lymphangioma, any site: Secondary | ICD-10-CM | POA: Diagnosis not present

## 2019-11-05 DIAGNOSIS — Z01818 Encounter for other preprocedural examination: Secondary | ICD-10-CM | POA: Insufficient documentation

## 2019-11-05 DIAGNOSIS — Z923 Personal history of irradiation: Secondary | ICD-10-CM | POA: Diagnosis not present

## 2019-11-05 MED ORDER — GADOBUTROL 1 MMOL/ML IV SOLN
7.0000 mL | Freq: Once | INTRAVENOUS | Status: AC | PRN
  Administered 2019-11-05: 7 mL via INTRAVENOUS

## 2019-11-05 NOTE — Progress Notes (Signed)
Radiation Oncology         [phone] ________________________________  Outpatient Reconsult - Conducted via telephone due to current COVID-19 concerns for limiting patient exposure  I spoke with the patient to conduct this consult visit via telephone to spare the patient unnecessary potential exposure in the healthcare setting during the current COVID-19 pandemic. The patient was notified in advance and was offered a [place] meeting to allow for face to face communication but unfortunately reported that they did not have the appropriate resources/technology to support such a visit and instead preferred to proceed with a telephone visit.  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  Interval Since Last Radiation:  14 months  07/30/2018-09/06/2018: The rectum was treated to 45 Gy in 25 fractions, and 5.4 Gy boost in 3 fractions  Narrative:  The patient was contacted today for routine follow-up. During treatment she did very well with radiotherapy and did not have significant desquamation. Since the patient was last seen, she has undergone multiple treatments including robotic LAR with loop ileostomy in September 2020 with 2 cm of residual disease but negative nodes and margins. She had reversal of the ileostomy in January 2021, also microwave ablation to a right hepatic lesion by Dr. Maudie Mercury at [facility] in May 2021.  There is a new lesion in the left hepatic lobe that is being evaluated again by Dr. Maudie Mercury to decide about possible further ablation.  Her recent imaging from 10/21/2019 showed an progression also in her lungs, with a new left lower lobe nodule that has been 9 mm previously and being followed, now it is 12 mm.  Her right upper lobe also had a nodule that has measured 4 to 5 mm and continues to be stable.  Her case was discussed in multidisciplinary GI oncology  conference last week, and she is contacted today to discuss the option of stereotactic body radiotherapy to the left lower lobe nodule. She has also met with Dr. Maudie Mercury at [facility] in IR and he does not feel she is a candidate for a new lesion in her liver for more ablation, but possibly embolization. An MRI is pending to further plan out next steps for treatment of her liver.  On review of systems, the patient reports that she has been doing okay. She states it has been nice working from home as she is still having significant trouble with bowel urgency and feels concerned about having accidents when she goes out of the house. This can be depressing at times but she feels like she has the support of those around her to keep her motivated to keep pushing through. She denies any chest pain, shortness of breath, cough, fevers, chills. No other complaints are noted.  PAST MEDICAL HISTORY:  Past Medical History:  Diagnosis Date  . Anxiety   . Cancer [facility]) 2013   thyroid  . Endometrial polyp   . Endometrial thickening on ultra sound   . GERD (gastroesophageal reflux disease)   . 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   Worse in her 73s when she was ill  . H/O Hashimoto thyroiditis   . History of chemotherapy   . History of radiation therapy   . 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 Hyperlipidemia 11/09/2015  . Hypothyroidism   . Malignant  neoplasm of rectum ([place]) 02/26/2018    PAST SURGICAL HISTORY: Past Surgical History:  Procedure Laterality Date  . BIOPSY  02/19/2018   Procedure: BIOPSY;  Surgeon: Rogene Houston, MD;  [facility];  Service: Endoscopy;;  rectum  . COLONOSCOPY N/A 08/20/2015   Procedure: COLONOSCOPY;  Surgeon: Rogene Houston, MD;  [facility];  Service: Endoscopy;  Laterality: N/A;  730  . [place] ENDOMETRIAL ABLATION  08-13-2004  .  DIVERTING ILEOSTOMY N/A 12/07/2018   Procedure: DIVERTING LOOP ILEOSTOMY;  Surgeon: Leighton Ruff, MD;  Location: [facility] OR[facility];  Service: General;  Laterality: N/A;  . FLEXIBLE SIGMOIDOSCOPY N/A 02/19/2018   Procedure: FLEXIBLE SIGMOIDOSCOPY;  Surgeon: Rogene Houston, MD;  [facility];  Service: Endoscopy;  Laterality: N/A;  . HYSTEROSCOPY WITH D & C N/A 04/30/2015   Procedure: DILATATION AND CURETTAGE / INTENDED HYSTEROSCOPY;  Surgeon: Dian Queen, MD;  Location: [facility];  Service: Gynecology;  Laterality: N/A;  . ILEOSTOMY CLOSURE N/A 04/17/2019   Procedure: LOOP ILEOSTOMY REVERSAL;  Surgeon: Leighton Ruff, MD;  Location: [facility];  Service: General;  Laterality: N/A;  . IR US GUIDE BX ASP/DRAIN  03/09/2018  . LAPAROSCOPIC CHOLECYSTECTOMY  04/1996  . LAPAROSCOPY N/A 12/11/2018   Procedure: LAPAROSCOPY  ILEOSTOMY REVISION AND ABDOMINAL WASHOUT;  Surgeon: Leighton Ruff, MD;  Location: [facility];  Service: General;  Laterality: N/A;  . Liver Microwave   10/17/2018  . POLYPECTOMY  08/20/2015   Procedure: POLYPECTOMY;  Surgeon: Rogene Houston, MD;  [facility];  Service: Endoscopy;;  Splenic flexure polypectomy  . POLYPECTOMY  02/19/2018   Procedure: POLYPECTOMY;  Surgeon: Rogene Houston, MD;  [facility];  Service: Endoscopy;;  rectum  . PORTACATH PLACEMENT N/A 03/14/2018   Procedure: INSERTION PORT-A-CATH;  Surgeon: Aviva Signs, MD;  Location: [facility];  Service: General;  Laterality: N/A;  . REDUCTION INCARCERATED UTERUS  06-20-2000   intrauterine preg. 13 wks /  urinary retention  . THYROID LOBECTOMY  11/24/2011   Procedure: THYROID LOBECTOMY;  Surgeon: Earnstine Regal, MD;  Location: [facility];  Service: General;  Laterality: Left;  Left Thyroid Lobectomy  . TUBAL LIGATION  2002  . XI ROBOTIC ASSISTED LOWER ANTERIOR RESECTION N/A 12/07/2018   Procedure: XI ROBOTIC ASSISTED LOWER ANTERIOR RESECTION, SPENIC FLEXURE IMMOBILIZATION, COLOANAL  ANASTOMOSIS, RIGID PROCTOSCOPY;  Surgeon: Leighton Ruff, MD;  Location: [facility];  Service: General;  Laterality: N/A;    PAST SOCIAL HISTORY:  Social History   Socioeconomic History  . Marital status: Married    Spouse name: Not on file  . Number of children: 4  . Years of education: Not on file  . Highest education level: Not on file  Occupational History    Comment: Scheduler at [employer]  . Smoking status: Former Smoker    Packs/day: 0.25    Years: 10.00    Pack years: 2.50    Types: Cigarettes    Quit date: 10/31/1991    Years since quitting: 28.0  . Smokeless tobacco: Never Used  Vaping Use  . Vaping Use: Never used  Substance and Sexual Activity  . Alcohol use: No  . Drug use: No  . Sexual activity: Not Currently  Other Topics Concern  . Not on file  Social History Narrative   Lives with husband [name] and 33 year old son [name]   Husband is Interior and spatial designer of [place]  Social Determinants of Health   Financial Resource Strain:   . Difficulty of Paying Living Expenses:   Food Insecurity:   . Worried About Charity fundraiser in the Last Year:   . Arboriculturist in the Last Year:   Transportation Needs:   . Film/video editor (Medical):   Marland Kitchen Lack of Transportation (Non-Medical):   Physical Activity:   . Days of Exercise per Week:   . Minutes of Exercise per Session:   Stress:   . Feeling of Stress :   Social Connections:   . Frequency of Communication with Friends and Family:   . Frequency of Social Gatherings with Friends and Family:   . Attends Religious Services:   . Active Member of Clubs or Organizations:   . Attends Archivist Meetings:   Marland Kitchen Marital Status:   Intimate Partner Violence:   . Fear of Current or Ex-Partner:   . Emotionally Abused:   Marland Kitchen Physically Abused:   . Sexually Abused:     PAST FAMILY HISTORY: Family History  Problem Relation Age of Onset  . Hypertension Mother   . Hypertension Father   .  Prostate cancer Father   . Cancer Maternal Aunt        spine/back  . Cancer Paternal Grandfather        lung  . Healthy Son   . Healthy Son   . Healthy Son   . Healthy Son   . Colon cancer Neg Hx     MEDICATIONS  Current Outpatient Medications  Medication Sig Dispense Refill  . ALPRAZolam (XANAX) 0.5 MG tablet Take one tablet tid 90 tablet 5  . amitriptyline (ELAVIL) 10 MG tablet Take 1 tablet (10 mg total) by mouth in the morning and at bedtime. (Patient taking differently: Take 10 mg by mouth at bedtime. ) 60 tablet 2  . dicyclomine (BENTYL) 10 MG capsule Take 1 capsule (10 mg total) by mouth 3 (three) times daily before meals. 90 capsule 5  . fluticasone (CUTIVATE) 0.05 % cream Apply 1 application topically daily as needed (irritatiton).    Marland Kitchen loperamide (IMODIUM) 2 MG capsule Take 1 capsule (2 mg total) by mouth 3 (three) times daily. 30 capsule 0  . Multiple Vitamin (MULTI-VITAMIN) tablet Take by mouth.    . prochlorperazine (COMPAZINE) 10 MG tablet Take 10 mg by mouth every 6 (six) hours as needed for nausea or vomiting.     . thyroid (ARMOUR THYROID) 30 MG tablet Take 1 tablet (30 mg total) by mouth daily before breakfast. Take one 39m tablet along with a 127mtablet daily (Patient taking differently: Take 60 mg by mouth daily before breakfast. ) 30 tablet 1  . lidocaine-prilocaine (EMLA) cream Apply small amount to port site one hour prior to appointment and cover with plastic wrap. (Patient not taking: Reported on 10/22/2019) 30 g 3  . ondansetron (ZOFRAN) 4 MG tablet Take 1 tablet (4 mg total) by mouth every 8 (eight) hours as needed for nausea or vomiting. (Patient not taking: Reported on 10/22/2019) 30 tablet 2  . OVER THE COUNTER MEDICATION Multivitamin w/iron     No current facility-administered medications for this encounter.   Facility-Administered Medications Ordered in Other Encounters  Medication Dose Route Frequency Provider Last Rate Last Admin  . clindamycin  (CLEOCIN) 900 mg in dextrose 5 % 50 mL IVPB  900 mg Intravenous 60 min Pre-Op ThLeighton RuffMD       And  . gentamicin (GARAMYCIN)  350 mg in dextrose 5 % 50 mL IVPB  5 mg/kg Intravenous 60 min Pre-Op Leighton Ruff, MD      . clindamycin (CLEOCIN) 900 mg in dextrose 5 % 50 mL IVPB  900 mg Intravenous 60 min Pre-Op Leighton Ruff, MD       And  . gentamicin (GARAMYCIN) 5 mg/kg in dextrose 5 % 50 mL IVPB  5 mg/kg Intravenous 60 min Pre-Op Leighton Ruff, MD      . sodium chloride 0.9 % 1,000 mL with potassium chloride 20 mEq, magnesium sulfate 2 g infusion   Intravenous Once Derek Jack, MD        ALLERGIES:  Allergies  Allergen Reactions  . Demerol [Meperidine] Itching    All over the body.  . Penicillins Hives     childhood, does not remember if it spread all over the body or not. Did it involve swelling of the face/tongue/throat, SOB, or low BP? No Did it involve sudden or severe rash/hives, skin peeling, or any reaction on the inside of your mouth or nose? Yes Did you need to seek medical attention at a hospital or doctor's office? Yes When did it last happen?childhood allergy If all above answers are "NO", may proceed with cephalosporin use.   . Other Hives and Other (See Comments)    Fresh coconut   . Vancomycin Rash    Will need Benadryl prior to administration per IV. "Red man syndrome"   Physical Exam: Wt Readings from Last 3 Encounters:  10/22/19 120 lb 11.2 oz (54.7 kg)  07/22/19 125 lb 12.8 oz (57.1 kg)  07/15/19 125 lb 4.8 oz (56.8 kg)   Temp Readings from Last 3 Encounters:  10/22/19 (!) 97.5 F (36.4 C) (Temporal)  07/22/19 (!) 96.5 F (35.8 C) (Temporal)  07/15/19 (!) 97.1 F (36.2 C) (Temporal)   BP Readings from Last 3 Encounters:  10/22/19 115/78  07/22/19 91/67  07/15/19 101/71   Pulse Readings from Last 3 Encounters:  10/22/19 99  07/22/19 84  07/15/19 61   Unable to assess due to encounter type   Impression/Plan: 1. 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 Dr. Lisbeth Renshaw discusses the pathology findings and reviews the nature of metastatic lung disease. He also discusses that while biopsy confirmation of this is an option, even if this were a separate stage I lung primary it would be treated with stereotactic body radiotherapy the same way. For this reason, it is not felt to be required. The patient is glad to forgo additional biopsy testing. Dr. Lisbeth Renshaw offers the patient a course of stereotactic body radiotherapy (SBRT) to the LLL nodule. He also discusses that he would like to follow up with the results of her MRI regarding her liver. If she is not a candidate at [facility] for more ablation, an alternative with definitive intent may be SBRT to this site as well. We will follow up with the results of her MRI and then contact Dr. Maudie Mercury. We discussed the risks, benefits, short, and long term effects of radiotherapy, and the patient is interested in proceeding with treatment to the lung, and possibly treatment to the liver.  Dr. Lisbeth Renshaw discusses the delivery and logistics of radiotherapy and anticipates a course of 3-5 fractions for the lung, but if she were to have SBRT to the liver as well, fiducial marker placement would be necessary and given over 5 fractions. We will follow up with the patient in the next day or so  for further decision making.   Given current concerns for patient exposure during the COVID-19 pandemic, this encounter was conducted via telephone.  The patient has provided two factor identification and has given verbal consent for this type of encounter and has been advised to only accept a meeting of this type in a secure network environment. The time spent during this encounter was 45 minutes including preparation, discussion, and coordination of the patient's care. The attendants for this meeting include Blenda Nicely, RN, Dr. Lisbeth Renshaw, Hayden Pedro  and Josue Hector and Guido Sander. During the encounter,  Blenda Nicely, RN, Dr. Lisbeth Renshaw, and Hayden Pedro were located at [facility] Radiation Oncology Department.  CANDYCE GAMBINO was located at home with her husband [name].      Carola Rhine, PAC

## 2019-11-07 ENCOUNTER — Other Ambulatory Visit: Payer: Self-pay

## 2019-11-07 ENCOUNTER — Ambulatory Visit
Admission: RE | Admit: 2019-11-07 | Discharge: 2019-11-07 | Disposition: A | Discharge status: Home / Self Care | Payer: 59 | Source: Ambulatory Visit | Attending: Radiation Oncology | Admitting: Radiation Oncology

## 2019-11-07 DIAGNOSIS — Z510 Encounter for antineoplastic radiation therapy: Secondary | ICD-10-CM | POA: Insufficient documentation

## 2019-11-07 DIAGNOSIS — C20 Malignant neoplasm of rectum: Secondary | ICD-10-CM | POA: Insufficient documentation

## 2019-11-07 DIAGNOSIS — C7802 Secondary malignant neoplasm of left lung: Secondary | ICD-10-CM | POA: Insufficient documentation

## 2019-11-08 ENCOUNTER — Telehealth: Payer: Self-pay | Admitting: Radiation Oncology

## 2019-11-08 ENCOUNTER — Other Ambulatory Visit: Payer: Self-pay | Admitting: Radiation Oncology

## 2019-11-08 DIAGNOSIS — C20 Malignant neoplasm of rectum: Secondary | ICD-10-CM

## 2019-11-08 NOTE — Telephone Encounter (Signed)
I was able to speak with Dr. Maudie Mercury at [facility] in Interventional Radiology. He has treated the patient's two other liver lesions in the right lobe with ablation. Unfortunately she has a new lung lesion, and we will proceed with SBRT to this site. In our conversation about her case, her new liver lesion is not in an appropriate location for Dr. Maudie Mercury to approach another ablation, so he was going to try for embolization. He had said that he had considered SBRT as an alternative, but apparently the rad oncs at [facility] don't usually treat with SBRT with this location. He was in favor of SBRT if she was a candidate in Dr. Ida Rogue opinion. He does not place fiducials so was also in agreement that we could ask Dr. Pascal Lux who performed her prior liver biopsy if he could do this instead. I let the patient know our discussion, and she is in favor of having SBRT to her liver as well as her lung, and requests that Dr. Pascal Lux be the provider to place her fiducials. I let her know that I would place orders and that once we know when fiducials can be placed we can coordinate simulation.

## 2019-11-11 ENCOUNTER — Other Ambulatory Visit (HOSPITAL_COMMUNITY): Payer: Self-pay

## 2019-11-11 ENCOUNTER — Other Ambulatory Visit (HOSPITAL_COMMUNITY): Payer: Self-pay | Admitting: Hematology

## 2019-11-11 MED ORDER — PROCHLORPERAZINE MALEATE 10 MG PO TABS: 10.0000 mg | ORAL_TABLET | Freq: Four times a day (QID) | ORAL | 2 refills | Status: DC | PRN

## 2019-11-11 MED FILL — PROCHLORPERAZINE 10 MG TAB: 10 | 7 days supply | Qty: 30 | Fill #0

## 2019-11-12 ENCOUNTER — Encounter (HOSPITAL_COMMUNITY): Payer: Self-pay | Admitting: Radiology

## 2019-11-12 NOTE — Progress Notes (Signed)
Victory Dakin. [place] Female, 54 y.o., 05-14-1965 MRN:  [id number] Phone:  [phone] Jerilynn Mages) PCP:  [name], MD Coverage:  [employer] Next Appt With Radiology ([facility]) 11/18/2019 at 1:00 PM  RE: US Liver Biopsy Received: Today Arne Cleveland, MD  Arlyn Leak   DDH       Previous Messages   ----- Message -----  From: Garth Bigness D  Sent: 11/12/2019  8:48 AM EDT  To: Ir Procedure Requests  Subject: US Liver Biopsy                  Procedure: US Liver Biopsy   Reason: Malignant neoplasm of rectum, new liver metastasis for SBRT,  Fiducial Marker Placement new left lobe liver lesion prior to SBRT   History: MR, CT, [name] in computer   Provider: Hayden Pedro   Provider Contact: [phone]

## 2019-11-13 DIAGNOSIS — C20 Malignant neoplasm of rectum: Secondary | ICD-10-CM | POA: Diagnosis not present

## 2019-11-13 DIAGNOSIS — Z510 Encounter for antineoplastic radiation therapy: Secondary | ICD-10-CM | POA: Diagnosis not present

## 2019-11-13 DIAGNOSIS — C7802 Secondary malignant neoplasm of left lung: Secondary | ICD-10-CM | POA: Diagnosis not present

## 2019-11-15 ENCOUNTER — Ambulatory Visit
Admission: RE | Admit: 2019-11-15 | Discharge: 2019-11-15 | Disposition: A | Discharge status: Home / Self Care | Payer: 59 | Source: Ambulatory Visit | Attending: Radiation Oncology | Admitting: Radiation Oncology

## 2019-11-15 ENCOUNTER — Ambulatory Visit: Payer: 59 | Admitting: Radiation Oncology

## 2019-11-15 ENCOUNTER — Other Ambulatory Visit: Payer: Self-pay

## 2019-11-15 ENCOUNTER — Other Ambulatory Visit: Payer: Self-pay | Admitting: Radiology

## 2019-11-15 ENCOUNTER — Other Ambulatory Visit: Payer: Self-pay | Admitting: Student

## 2019-11-15 DIAGNOSIS — C7802 Secondary malignant neoplasm of left lung: Secondary | ICD-10-CM | POA: Diagnosis not present

## 2019-11-15 DIAGNOSIS — Z510 Encounter for antineoplastic radiation therapy: Secondary | ICD-10-CM | POA: Diagnosis not present

## 2019-11-15 DIAGNOSIS — C20 Malignant neoplasm of rectum: Secondary | ICD-10-CM | POA: Diagnosis not present

## 2019-11-17 NOTE — Consult Note (Addendum)
Chief Complaint: Left lobe liver lesion. Request is for liver marking for external radiation  Referring Physician(s): Dr. Ashok Cordia  Supervising Physician: Sandi Mariscal   Patient Status: [facility] - Out-pt  History of Present Illness: Samantha Reynolds is a 54 y.o. female History of GERD, HLD,  thyroid cancer (2013) s/p left lobe thyroidectomy, metastatic rectal cancer with solitary liver s/p diverting loop ileostomy on 9.1.20 and subsequent reversal.  Liver microwave ablation performed on 7.2.21. Metastasis confirmed as adenocarcinoma from IR liver biopsy performed on 12.13.19. US Liver from 7.30.21 reads Three liver lesions identified on prior CT are sonographically demonstrated, including a 1.2 cm diameter lesion in the LEFT lobe of the liver. MRI from 8.1.21 reads Isolated left hepatic metastasis, as on prior CT. 2. Treated metastasis within the right hepatic lobe, without residual or recurrent disease. Team is requesting liver marking prior SRBT therapy.  Past Medical History:  Diagnosis Date  . Anxiety   . Cancer [facility]) 2013   thyroid  . Endometrial polyp   . Endometrial thickening on ultra sound   . GERD (gastroesophageal reflux disease)   . 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 H/O Hashimoto thyroiditis   . History of chemotherapy   . History of radiation therapy   . 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 Hyperlipidemia 11/09/2015  . Hypothyroidism   . 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[place]) 02/26/2018    Past Surgical History:  Procedure Laterality Date  . BIOPSY  02/19/2018   Procedure: BIOPSY;  Surgeon: Rogene Houston, MD;  [facility];  Service: Endoscopy;;  rectum  . COLONOSCOPY N/A 08/20/2015   Procedure: COLONOSCOPY;  Surgeon: Rogene Houston, MD;  [facility];  Service: Endoscopy;  Laterality: N/A;  730  . [place] ENDOMETRIAL ABLATION  08-13-2004  . DIVERTING ILEOSTOMY N/A 12/07/2018   Procedure: DIVERTING LOOP ILEOSTOMY;  Surgeon: Leighton Ruff, MD;  Location: [facility];  Service: General;  Laterality: N/A;  . FLEXIBLE SIGMOIDOSCOPY N/A 02/19/2018   Procedure: FLEXIBLE SIGMOIDOSCOPY;  Surgeon: Rogene Houston, MD;  [facility];  Service: Endoscopy;  Laterality: N/A;  . HYSTEROSCOPY WITH D & C N/A 04/30/2015   Procedure: DILATATION AND CURETTAGE / INTENDED HYSTEROSCOPY;  Surgeon: Dian Queen, MD;  Location: [place];  Service: Gynecology;  Laterality: N/A;  . ILEOSTOMY CLOSURE N/A 04/17/2019   Procedure: LOOP ILEOSTOMY REVERSAL;  Surgeon: Leighton Ruff, MD;  [facility]: [facility];  Service: General;  Laterality: N/A;  . IR US GUIDE BX ASP/DRAIN  03/09/2018  . LAPAROSCOPIC CHOLECYSTECTOMY  04/1996  . LAPAROSCOPY N/A 12/11/2018   Procedure: LAPAROSCOPY  ILEOSTOMY REVISION AND ABDOMINAL WASHOUT;  Surgeon: Leighton Ruff, MD;  [facility];  Service: General;  Laterality: N/A;  . Liver Microwave   10/17/2018  . POLYPECTOMY  08/20/2015   Procedure: POLYPECTOMY;  Surgeon: Rogene Houston, MD;  [facility];  Service: Endoscopy;;  Splenic flexure polypectomy  . POLYPECTOMY  02/19/2018   Procedure: POLYPECTOMY;  Surgeon: Rogene Houston, MD;  [facility];  Service: Endoscopy;;  rectum  . PORTACATH PLACEMENT N/A 03/14/2018   Procedure: INSERTION PORT-A-CATH;  Surgeon: Aviva Signs, MD;  Location: [facility];  Service: General;  Laterality: N/A;  . REDUCTION INCARCERATED UTERUS  06-20-2000   intrauterine preg. [place]  wks /  urinary retention  . THYROID LOBECTOMY  11/24/2011   Procedure: THYROID LOBECTOMY;  Surgeon: Earnstine Regal, MD;  [facility];  Service: General;  Laterality: Left;  Left Thyroid Lobectomy  . TUBAL LIGATION  2002  . XI ROBOTIC ASSISTED LOWER ANTERIOR RESECTION N/A 12/07/2018   Procedure: XI ROBOTIC ASSISTED LOWER  ANTERIOR RESECTION, SPENIC FLEXURE IMMOBILIZATION, COLOANAL ANASTOMOSIS, RIGID PROCTOSCOPY;  Surgeon: Leighton Ruff, MD;  [facility];  Service: General;  Laterality: N/A;    Allergies: Demerol [meperidine], Penicillins, Other, and Vancomycin  Medications: Prior to Admission medications   Medication Sig Start Date End Date Taking? Authorizing Provider  ALPRAZolam Duanne Moron) 0.5 MG tablet Take one tablet tid 07/12/19   [name], MD  amitriptyline (ELAVIL) 10 MG tablet Take 1 tablet (10 mg total) by mouth in the morning and at bedtime. Patient taking differently: Take 10 mg by mouth at bedtime.  07/31/19   Rehman, Mechele Dawley, MD  dicyclomine (BENTYL) 10 MG capsule Take 1 capsule (10 mg total) by mouth 3 (three) times daily before meals. 06/21/19   Rehman, Mechele Dawley, MD  fluticasone (CUTIVATE) 0.05 % cream Apply 1 application topically daily as needed (irritatiton).    [provider]  lidocaine-prilocaine (EMLA) cream Apply small amount to port site one hour prior to appointment and cover with plastic wrap. Patient not taking: Reported on 10/22/2019 03/13/18   Derek Jack, MD  loperamide (IMODIUM) 2 MG capsule Take 1 capsule (2 mg total) by mouth 3 (three) times daily. 07/30/37   Leighton Ruff, MD  Multiple Vitamin (MULTI-VITAMIN) tablet Take by mouth.    [provider]  ondansetron (ZOFRAN) 4 MG tablet Take 1 tablet (4 mg total) by mouth every 8 (eight) hours as needed for nausea or vomiting. Patient not taking: Reported on 10/22/2019 07/31/18   Derek Jack, MD  OVER THE COUNTER MEDICATION Multivitamin w/iron    [provider]  prochlorperazine (COMPAZINE) 10 MG tablet Take 1 tablet (10 mg total) by mouth every 6 (six) hours as needed for nausea or vomiting. 11/11/19   Derek Jack, MD  thyroid [facility] THYROID) 30 MG tablet Take 1 tablet (30 mg total) by mouth daily before breakfast. Take one 30mg  tablet along with a 15mg  tablet daily Patient  taking differently: Take 60 mg by mouth daily before breakfast.  04/11/19   Derek Jack, MD     Family History  Problem Relation Age of Onset  . Hypertension Mother   . Hypertension Father   . Prostate cancer Father   . Cancer Maternal Aunt        spine/back  . Cancer Paternal Grandfather        lung  . Healthy Son   . Healthy Son   . Healthy Son   . Healthy Son   . Colon cancer Neg Hx     Social History   Socioeconomic History  . Marital status: Married    Spouse name: Not on file  . Number of children: 4  . Years of education: Not on file  . Highest education level: Not on file  Occupational History    Comment: Scheduler at [employer]  . Smoking status: Former Smoker    Packs/day: 0.25    Years: 10.00    Pack years: 2.50    Types: Cigarettes    Quit date: 10/31/1991    Years since quitting: 28.0  . Smokeless tobacco: Never Used  Vaping Use  . Vaping  Use: Never used  Substance and Sexual Activity  . Alcohol use: No  . Drug use: No  . Sexual activity: Not Currently  Other Topics Concern  . Not on file  Social History Narrative   Lives with husband [name] and 84 year old son [name]   Husband is Interior and spatial designer of [employer]   Social Determinants of Health   Financial Resource Strain:   . Difficulty of Paying Living Expenses: Not on file  Food Insecurity:   . Worried About Charity fundraiser in the Last Year: Not on file  . Ran Out of Food in the Last Year: Not on file  Transportation Needs:   . Lack of Transportation (Medical): Not on file  . Lack of Transportation (Non-Medical): Not on file  Physical Activity:   . Days of Exercise per Week: Not on file  . Minutes of Exercise per Session: Not on file  Stress:   . Feeling of Stress : Not on file  Social Connections:   . Frequency of Communication with Friends and Family: Not on file  . Frequency of Social Gatherings with Friends and Family: Not on file  . Attends Religious Services:  Not on file  . Active Member of Clubs or Organizations: Not on file  . Attends Archivist Meetings: Not on file  . Marital Status: Not on file    Review of Systems: A 12 point ROS discussed and pertinent positives are indicated in the HPI above.  All other systems are negative.  Review of Systems  Constitutional: Negative for fatigue and fever.  HENT: Negative for congestion.   Respiratory: Negative for cough and shortness of breath.   Gastrointestinal: Negative for abdominal pain, diarrhea, nausea and vomiting.    Vital Signs: BP 124/87 (BP Location: Right Arm)   Pulse (!) 103   Temp (!) 97.5 F (36.4 C) (Oral)   Resp 16   LMP 02/16/2015 (Approximate)   SpO2 100%   Physical Exam Vitals and nursing note reviewed.  Constitutional:      Appearance: She is well-developed.  HENT:     Head: Normocephalic and atraumatic.  Eyes:     Conjunctiva/sclera: Conjunctivae normal.  Cardiovascular:     Rate and Rhythm: Regular rhythm. Tachycardia present.     Heart sounds: Normal heart sounds.  Pulmonary:     Effort: Pulmonary effort is normal.     Breath sounds: Normal breath sounds.  Musculoskeletal:        General: Normal range of motion.     Cervical back: Normal range of motion.  Skin:    General: Skin is warm.  Neurological:     Mental Status: She is alert and oriented to person, place, and time.     Imaging: CT Chest W Contrast  Result Date: 10/21/2019 CLINICAL DATA:  History of rectal cancer post ablation of liver mass, status post colonic resection with chemo and radiotherapy. Also with history of thyroid cancer. EXAM: CT CHEST, ABDOMEN, AND PELVIS WITH CONTRAST TECHNIQUE: Multidetector CT imaging of the chest, abdomen and pelvis was performed following the standard protocol during bolus administration of intravenous contrast. CONTRAST:  120mL OMNIPAQUE IOHEXOL 300 MG/ML  SOLN COMPARISON:  07/10/2019 FINDINGS: CT CHEST FINDINGS Cardiovascular: Ascending aortic  aneurysmal dilation 4.6 cm unchanged. LEFT-sided Port-A-Cath in situ. Tip in the mid superior vena cava as before. Heart size is normal. No pericardial effusion. Central pulmonary vasculature unremarkable on venous phase assessment. Mediastinum/Nodes: Thoracic inlet structures with signs of thyroidectomy.  No adenopathy. No axillary adenopathy. No mediastinal lymphadenopathy. Esophagus is grossly normal. Lungs/Pleura: LEFT lower lobe pulmonary nodule increasing in size (image 149, series 4) 12 x 8 mm previously 9 x 6 mm. RIGHT upper lobe nodule (image 37, series 4) 5 mm previously approximately 4 mm. No consolidation. No pleural effusion. Airways are patent. Musculoskeletal: See below for full musculoskeletal details. CT ABDOMEN PELVIS FINDINGS Hepatobiliary: Post treatment changes related to interval microwave ablation in the RIGHT hepatic lobe, (image 72, series 2) 2.6 x 2.0 cm at the site of previous lesion in hepatic subsegment VI. Lesion in the posterior RIGHT hemi liver along the boundary of hepatic subsegment VI and VII 3.5 x 3.0 cm reflecting post treatment changes in the area of previously described lesion. New lesion in the LEFT hepatic lobe, hepatic subsegment III (image 68, series 2) 13 x 10 mm. Very mild intrahepatic biliary ductal distension and extrahepatic biliary ductal distension following cholecystectomy. Pancreas: Pancreas is normal without focal lesion, ductal dilation or inflammation. Spleen: Vague area of low attenuation in the peripheral spleen is unchanged measuring approximately 2.5 cm greatest axial dimension similar appearance on the study of September 27, 2018. Spleen moderately enlarged similar to the previous exam. 14 cm greatest craniocaudal dimension. Adrenals/Urinary Tract: Adrenal glands are normal. Kidneys enhance symmetrically without suspicious focal abnormality. No hydronephrosis.  Urinary bladder under distended. Stomach/Bowel: Gastrointestinal tract without acute process. Small  bowel nondistended. Postoperative changes of partial colonic resection with low rectal anastomosis just above the pelvic floor with similar appearance. Site of prior ostomy noted in the RIGHT hemiabdomen with similar appearance. Vascular/Lymphatic: Vascular structures in the abdomen are patent. Scattered atheromatous plaque in the abdominal aorta. No aneurysm. No adenopathy. No pelvic lymphadenopathy. Reproductive: Grossly normal by CT.  No adnexal masses. Other: No hernia. No ascites. Stranding in the body wall related to prior ostomy site. Musculoskeletal: No acute bone finding. No destructive bone process. IMPRESSION: 1. Pulmonary nodules in the chest, largest in the LEFT lower lobe has enlarged since the prior study, suspicious for metastatic disease or primary lung cancer as described. 2. Interval microwave ablation of lesions in the RIGHT hepatic lobe with NEW LEFT hepatic lobe lesion compatible with additional site of metastasis as discussed. 3. Stable low-density splenic lesion with splenomegaly. Low-density splenic lesion likely benign and unchanged over time. 4. Ascending aortic aneurysmal dilation 4.6 cm unchanged. 5. Stable post treatment and postoperative changes in the pelvis. 6. Aortic atherosclerosis. These results will be called to the ordering clinician or representative by the Radiologist Assistant, and communication documented in the PACS or [employer]. Aortic Atherosclerosis (ICD10-I70.0). Electronically Signed   By: Zetta Bills M.D.   On: 10/21/2019 11:11   CT Abdomen Pelvis W Contrast  Result Date: 10/21/2019 CLINICAL DATA:  History of rectal cancer post ablation of liver mass, status post colonic resection with chemo and radiotherapy. Also with history of thyroid cancer. EXAM: CT CHEST, ABDOMEN, AND PELVIS WITH CONTRAST TECHNIQUE: Multidetector CT imaging of the chest, abdomen and pelvis was performed following the standard protocol during bolus administration of intravenous  contrast. CONTRAST:  116mL OMNIPAQUE IOHEXOL 300 MG/ML  SOLN COMPARISON:  07/10/2019 FINDINGS: CT CHEST FINDINGS Cardiovascular: Ascending aortic aneurysmal dilation 4.6 cm unchanged. LEFT-sided Port-A-Cath in situ. Tip in the mid superior vena cava as before. Heart size is normal. No pericardial effusion. Central pulmonary vasculature unremarkable on venous phase assessment. Mediastinum/Nodes: Thoracic inlet structures with signs of thyroidectomy. No adenopathy. No axillary adenopathy. No mediastinal lymphadenopathy. Esophagus is grossly normal.  Lungs/Pleura: LEFT lower lobe pulmonary nodule increasing in size (image 149, series 4) 12 x 8 mm previously 9 x 6 mm. RIGHT upper lobe nodule (image 37, series 4) 5 mm previously approximately 4 mm. No consolidation. No pleural effusion. Airways are patent. Musculoskeletal: See below for full musculoskeletal details. CT ABDOMEN PELVIS FINDINGS Hepatobiliary: Post treatment changes related to interval microwave ablation in the RIGHT hepatic lobe, (image 72, series 2) 2.6 x 2.0 cm at the site of previous lesion in hepatic subsegment VI. Lesion in the posterior RIGHT hemi liver along the boundary of hepatic subsegment VI and VII 3.5 x 3.0 cm reflecting post treatment changes in the area of previously described lesion. New lesion in the LEFT hepatic lobe, hepatic subsegment III (image 68, series 2) 13 x 10 mm. Very mild intrahepatic biliary ductal distension and extrahepatic biliary ductal distension following cholecystectomy. Pancreas: Pancreas is normal without focal lesion, ductal dilation or inflammation. Spleen: Vague area of low attenuation in the peripheral spleen is unchanged measuring approximately 2.5 cm greatest axial dimension similar appearance on the study of September 27, 2018. Spleen moderately enlarged similar to the previous exam. 14 cm greatest craniocaudal dimension. Adrenals/Urinary Tract: Adrenal glands are normal. Kidneys enhance symmetrically without  suspicious focal abnormality. No hydronephrosis.  Urinary bladder under distended. Stomach/Bowel: Gastrointestinal tract without acute process. Small bowel nondistended. Postoperative changes of partial colonic resection with low rectal anastomosis just above the pelvic floor with similar appearance. Site of prior ostomy noted in the RIGHT hemiabdomen with similar appearance. Vascular/Lymphatic: Vascular structures in the abdomen are patent. Scattered atheromatous plaque in the abdominal aorta. No aneurysm. No adenopathy. No pelvic lymphadenopathy. Reproductive: Grossly normal by CT.  No adnexal masses. Other: No hernia. No ascites. Stranding in the body wall related to prior ostomy site. Musculoskeletal: No acute bone finding. No destructive bone process. IMPRESSION: 1. Pulmonary nodules in the chest, largest in the LEFT lower lobe has enlarged since the prior study, suspicious for metastatic disease or primary lung cancer as described. 2. Interval microwave ablation of lesions in the RIGHT hepatic lobe with NEW LEFT hepatic lobe lesion compatible with additional site of metastasis as discussed. 3. Stable low-density splenic lesion with splenomegaly. Low-density splenic lesion likely benign and unchanged over time. 4. Ascending aortic aneurysmal dilation 4.6 cm unchanged. 5. Stable post treatment and postoperative changes in the pelvis. 6. Aortic atherosclerosis. These results will be called to the ordering clinician or representative by the Radiologist Assistant, and communication documented in the PACS or [employer]. Aortic Atherosclerosis (ICD10-I70.0). Electronically Signed   By: Zetta Bills M.D.   On: 10/21/2019 11:11   MR LIVER W WO CONTRAST  Result Date: 11/05/2019 CLINICAL DATA:  Liver metastasis. Colorectal primary. Preop. Ablation of liver mass. History of thyroid cancer. EXAM: MRI ABDOMEN WITHOUT AND WITH CONTRAST TECHNIQUE: Multiplanar multisequence MR imaging of the abdomen was performed  both before and after the administration of intravenous contrast. CONTRAST:  51mL GADAVIST GADOBUTROL 1 MMOL/ML IV SOLN COMPARISON:  10/21/2019 CT. FINDINGS: Lower chest: Normal heart size without pericardial or pleural effusion. A left lower lobe 7 mm pulmonary nodule, including on 17/20, similar to on the prior MRI and better evaluated on prior CT. Hepatobiliary: 2 ablation sites are identified within the right lobe of the liver. Segment 6/7 3.1 cm site on 26/20. Inferior segment 6 3.0 x 2.2 cm site on 46/20. Neither demonstrates findings of residual or recurrent disease. Again identified is a segment 4B 7 mm hypoenhancing lesion on 33/18 which  corresponds to restricted diffusion on 18/5, T2 hyperintensity on 15/4. Cholecystectomy, without biliary ductal dilatation. Pancreas:  Normal, without mass or ductal dilatation. Spleen: Mild splenomegaly again identified, including at maximally 12.2 cm transverse. 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 Adrenals/Urinary Tract: Normal adrenal glands. Interpolar subcentimeter right renal cyst. Normal left kidney. No hydronephrosis. Stomach/Bowel: Normal stomach and small bowel. Colonic stool burden suggests constipation. Vascular/Lymphatic: Aortic atherosclerosis. No abdominal adenopathy. Other:  No ascites. Musculoskeletal: No acute osseous abnormality. IMPRESSION: 1. Isolated left hepatic metastasis, as on prior CT. 2. Treated metastasis within the right hepatic lobe, without residual or recurrent disease. 3.  Possible constipation. 4. Left lung base nodule, as before. 5. Splenomegaly with a similar multi septated cystic lesion, favoring a lymphangioma. Electronically Signed   By: Abigail Miyamoto M.D.   On: 11/05/2019 15:14   US Abdomen Limited RUQ  Result Date: 10/25/2019 CLINICAL DATA:  Liver metastases, colorectal cancer metastatic to liver EXAM: ULTRASOUND ABDOMEN LIMITED RIGHT UPPER  QUADRANT COMPARISON:  CT abdomen and pelvis 10/21/2019 FINDINGS: Gallbladder: Surgically absent Common bile duct: Diameter: 7 mm, normal post cholecystectomy Liver: Normal echogenicity. Slightly nodular hepatic margins. Slightly ill-defined hyperechoic nodule LEFT lobe liver 1.1 x 1.2 x 1.2 cm, corresponding to lesion identified on CT. Second lesion posterior RIGHT lobe 2.3 x 1.9 x 2.1 cm with a peripheral area of hypoechogenicity extending to the capsule over approximately 5 x 4 cm, corresponding to lesion on CT. Third lesion inferiorly RIGHT lobe liver 3.5 x 4.4 x 3.1 cm corresponding to lesion on CT. Questionable hypoechoic nodule at anterior liver 14 x 13 x 15 mm versus heterogeneous parenchyma; no corresponding finding on CT. Portal vein is patent on color Doppler imaging with normal direction of blood flow towards the liver. Other: No ascites IMPRESSION: Three liver lesions identified on prior CT are sonographically demonstrated, including a 1.2 cm diameter lesion in the LEFT lobe of the liver. Question additional vague anterior liver lesion 15 mm greatest diameter, without prior CT abnormality at this site, may represent heterogeneous parenchyma rather than a discrete liver lesion but can assess by either follow-up MR or subsequent CT imaging. Electronically Signed   By: Lavonia Dana M.D.   On: 10/25/2019 12:25    Labs:  CBC: Recent Labs    04/19/19 0412 04/20/19 0446 07/10/19 0844 10/18/19 0844  WBC 5.7 5.0 4.3 2.7*  HGB 9.6* 9.5* 13.0 11.2*  HCT 28.1* 27.4* 38.8 32.1*  PLT 87* 94* 139* 82*    COAGS: No results for input(s): INR, APTT in the last 8760 hours.  BMP: Recent Labs    04/19/19 0412 04/20/19 0446 07/10/19 0844 10/18/19 0844  NA 135 135 137 139  K 3.5 3.4* 3.2* 3.3*  CL 100 98 99 105  CO2 28 28 27 26   GLUCOSE 113* 96 92 112*  BUN 13 11 14 17   CALCIUM 8.6* 8.7* 9.7 8.9  CREATININE 0.78 0.82 0.82 0.82  GFRNONAA >60 >60 >60 >60  GFRAA >60 >60 >60 >60    LIVER  FUNCTION TESTS: Recent Labs    04/09/19 1004 07/10/19 0844 08/06/19 1000 10/18/19 0844  BILITOT 5.5* 5.1* 5.8* 2.7*  AST 30 28 26 25   ALT 27 19 18 16   ALKPHOS 90 83 83 97  PROT 8.5* 7.8 8.0 7.0  ALBUMIN 5.0 5.1* 4.9 4.3    Assessment and Plan: 54 y.o, female outpatient. History of GERD, HLD,  thyroid  cancer (2013) s/p left lobe thyroidectomy, metastatic rectal cancer with solitary liver s/p diverting loop ileostomy on 9.1.20 with subsequent reversal.  Liver microwave ablation performed on 7.2.21. Metastasis confirmed as adenocarcinoma from IR liver biopsy performed on 12.13.19. US Liver from 7.30.21 reads Three liver lesions identified on prior CT are sonographically demonstrated, including a 1.2 cm diameter lesion in the LEFT lobe of the liver. MRI from 8.1.21 reads Isolated left hepatic metastasis, as on prior CT. 2. Treated metastasis within the right hepatic lobe, without residual or recurrent disease. Team is requesting liver marking prior SRBT therapy.  Patient is allergic to PCN and vancomycin All labs and medications are within acceptable parameters.  Patient is afebrile.  Risks and benefits of liver marking was discussed with the patient and/or patient's family including, but not limited to bleeding, infection, damage to adjacent structures or low yield requiring additional tests.  All of the questions were answered and there is agreement to proceed.  Consent signed and in chart.   Thank you for this interesting consult.  I greatly enjoyed meeting Samantha Reynolds and look forward to participating in their care.  A copy of this report was sent to the requesting provider on this date.  Electronically Signed: Jacqualine Mau, NP 11/18/2019, 11:49 AM   I spent a total of  40 Minutes   in face to face in clinical consultation, greater than 50% of which was counseling/coordinating care for liver lesion marking for external radiation.

## 2019-11-18 ENCOUNTER — Ambulatory Visit (HOSPITAL_COMMUNITY)
Admission: RE | Admit: 2019-11-18 | Discharge: 2019-11-18 | Disposition: A | Discharge status: Home / Self Care | Payer: 59 | Source: Ambulatory Visit | Attending: Family Medicine | Admitting: Family Medicine

## 2019-11-18 ENCOUNTER — Encounter (HOSPITAL_COMMUNITY): Payer: Self-pay

## 2019-11-18 ENCOUNTER — Other Ambulatory Visit: Payer: Self-pay | Admitting: Radiation Oncology

## 2019-11-18 ENCOUNTER — Ambulatory Visit (HOSPITAL_COMMUNITY)
Admission: RE | Admit: 2019-11-18 | Discharge: 2019-11-18 | Disposition: A | Discharge status: Home / Self Care | Payer: 59 | Source: Ambulatory Visit | Attending: Radiation Oncology | Admitting: Radiation Oncology

## 2019-11-18 ENCOUNTER — Ambulatory Visit: Payer: 59 | Admitting: Radiation Oncology

## 2019-11-18 ENCOUNTER — Other Ambulatory Visit: Payer: Self-pay

## 2019-11-18 DIAGNOSIS — C20 Malignant neoplasm of rectum: Secondary | ICD-10-CM

## 2019-11-18 DIAGNOSIS — K7689 Other specified diseases of liver: Secondary | ICD-10-CM | POA: Diagnosis not present

## 2019-11-18 DIAGNOSIS — Z9089 Acquired absence of other organs: Secondary | ICD-10-CM | POA: Diagnosis not present

## 2019-11-18 DIAGNOSIS — Z932 Ileostomy status: Secondary | ICD-10-CM | POA: Diagnosis not present

## 2019-11-18 DIAGNOSIS — Z85850 Personal history of malignant neoplasm of thyroid: Secondary | ICD-10-CM | POA: Diagnosis not present

## 2019-11-18 LAB — CBC
HCT: 32.1 % — ABNORMAL LOW (ref 36.0–46.0)
Hemoglobin: 11.3 g/dL — ABNORMAL LOW (ref 12.0–15.0)
MCH: 33.3 pg (ref 26.0–34.0)
MCHC: 35.2 g/dL (ref 30.0–36.0)
MCV: 94.7 fL (ref 80.0–100.0)
Platelets: 82 10*3/uL — ABNORMAL LOW (ref 150–400)
RBC: 3.39 MIL/uL — ABNORMAL LOW (ref 3.87–5.11)
RDW: 13.2 % (ref 11.5–15.5)
WBC: 3.1 10*3/uL — ABNORMAL LOW (ref 4.0–10.5)
nRBC: 0 % (ref 0.0–0.2)

## 2019-11-18 LAB — PROTIME-INR
INR: 1.1 (ref 0.8–1.2)
Prothrombin Time: 13.3 seconds (ref 11.4–15.2)

## 2019-11-18 MED ORDER — FENTANYL CITRATE (PF) 100 MCG/2ML IJ SOLN
INTRAMUSCULAR | Status: AC
  Filled 2019-11-18: qty 2

## 2019-11-18 MED ORDER — MIDAZOLAM HCL 2 MG/2ML IJ SOLN
INTRAMUSCULAR | Status: AC
  Filled 2019-11-18: qty 4

## 2019-11-18 MED ORDER — SODIUM CHLORIDE 0.9 % IV SOLN: INTRAVENOUS | Status: DC

## 2019-11-18 MED ORDER — HEPARIN SOD (PORK) LOCK FLUSH 100 UNIT/ML IV SOLN
500.0000 [IU] | Freq: Once | INTRAVENOUS | Status: AC
  Administered 2019-11-18: 500 [IU] via INTRAVENOUS
  Filled 2019-11-18: qty 5

## 2019-11-18 MED ORDER — FENTANYL CITRATE (PF) 100 MCG/2ML IJ SOLN
INTRAMUSCULAR | Status: AC | PRN
Start: 2019-11-18 — End: 2019-11-18
  Administered 2019-11-18 (×2): 50 ug via INTRAVENOUS

## 2019-11-18 MED ORDER — LIDOCAINE-EPINEPHRINE 1 %-1:100000 IJ SOLN
INTRAMUSCULAR | Status: AC | PRN
  Administered 2019-11-18: 10 mL

## 2019-11-18 MED ORDER — SODIUM CHLORIDE (PF) 0.9 % IJ SOLN
INTRAMUSCULAR | Status: AC
  Filled 2019-11-18: qty 50

## 2019-11-18 MED ORDER — DIPHENHYDRAMINE HCL 50 MG/ML IJ SOLN
INTRAMUSCULAR | Status: AC
  Filled 2019-11-18: qty 1

## 2019-11-18 MED ORDER — IOHEXOL 300 MG/ML  SOLN
100.0000 mL | Freq: Once | INTRAMUSCULAR | Status: AC | PRN
  Administered 2019-11-18: 100 mL via INTRAVENOUS

## 2019-11-18 MED ORDER — MIDAZOLAM HCL 2 MG/2ML IJ SOLN
INTRAMUSCULAR | Status: AC | PRN
  Administered 2019-11-18 (×4): 1 mg via INTRAVENOUS

## 2019-11-18 MED ORDER — DIPHENHYDRAMINE HCL 50 MG/ML IJ SOLN
INTRAMUSCULAR | Status: AC | PRN
  Administered 2019-11-18: 25 mg via INTRAVENOUS

## 2019-11-18 NOTE — Progress Notes (Deleted)
Accessed power port using 21g huber needle. Good blood return. Drew labs for CBC and PT/PTT.

## 2019-11-18 NOTE — Procedures (Signed)
Pre procedural Dx: Progressive hepatic metastatic disease Post procedural Dx: Same  Technically successful [name] and CT guided fiducial bracketing of new hepatic metastasis within the central inferior aspect of the left lobe of the liver.    EBL: None.  Complications: None immediate.   Ronny Bacon, MD Pager #: [phone]

## 2019-11-18 NOTE — Discharge Instructions (Signed)
Liver Biopsy, Care After These instructions give you information about how to care for yourself after your procedure. Your health care provider may also give you more specific instructions. If you have problems or questions, contact your health care provider. What can I expect after the procedure? After your procedure, it is common to have:  Pain and soreness in the area where the biopsy was done.  Bruising around the area where the biopsy was done.  Sleepiness and fatigue for 1-2 days. Follow these instructions at home: Medicines  Take over-the-counter and prescription medicines only as told by your health care provider.  If you were prescribed an antibiotic medicine, take it as told by your health care provider. Do not stop taking the antibiotic even if you start to feel better.  Do not take medicines such as aspirin and ibuprofen unless your health care provider tells you to take them. These medicines thin your blood and can increase the risk of bleeding.  If you are taking prescription pain medicine, take actions to prevent or treat constipation. Your health care provider may recommend that you: ? Drink enough fluid to keep your urine pale yellow. ? Eat foods that are high in fiber, such as fresh fruits and vegetables, whole grains, and beans. ? Limit foods that are high in fat and processed sugars, such as fried or sweet foods. ? Take an over-the-counter or prescription medicine for constipation. Incision care  Follow instructions from your health care provider about how to take care of your incision. Make sure you: ? Wash your hands with soap and water before you change your bandage (dressing). If soap and water are not available, use hand sanitizer. ? Change your dressing as told by your health care provider. ? Leave stitches (sutures), skin glue, or adhesive strips in place. These skin closures may need to stay in place for 2 weeks or longer. If adhesive strip edges start to  loosen and curl up, you may trim the loose edges. Do not remove adhesive strips completely unless your health care provider tells you to do that.  Check your incision area every day for signs of infection. Check for: ? Redness, swelling, or pain. ? Fluid or blood. ? Warmth. ? Pus or a bad smell.  Do not take baths, swim, or use a hot tub until your health care provider says it is okay to do so. Activity   Rest at home for 1-2 days, or as directed by your health care provider. ? Avoid sitting for a long time without moving. Get up to take short walks every 1-2 hours. This is important to improve blood flow and breathing. Ask for help if you feel weak or unsteady.  Return to your normal activities as told by your health care provider. Ask your health care provider what activities are safe for you.  Do not drive or use heavy machinery while taking prescription pain medicine.  Do not lift anything that is heavier than 10 lb (4.5 kg), or the limit that your health care provider tells you, until he or she says that it is safe.  Do not play contact sports for 2 weeks after the procedure. General instructions   Do not drink alcohol in the first week after the procedure.  Have someone stay with you for at least 24 hours after the procedure.  It is your responsibility to obtain your test results. Ask your health care provider, or the department that is doing the test: ? When will my   results be ready? ? How will I get my results? ? What are my treatment options? ? What other tests do I need? ? What are my next steps?  Keep all follow-up visits as told by your health care provider. This is important. Contact a health care provider if:  You have increased bleeding from an incision, resulting in more than a small spot of blood.  You have redness, swelling, or increasing pain in any incisions.  You notice a discharge or a bad smell coming from any of your incisions.  You have a fever or  chills. Get help right away if:  You develop swelling, bloating, or pain in your abdomen.  You become dizzy or faint.  You develop a rash.  You have nausea or you vomit.  You faint, or you have shortness of breath or difficulty breathing.  You develop chest pain.  You have problems with your speech or vision.  You have trouble with your balance or moving your arms or legs. Summary  After the liver biopsy, it is common to have pain, soreness, and bruising in the area, as well as sleepiness and fatigue.  Take over-the-counter and prescription medicines only as told by your health care provider.  Follow instructions from your health care provider about how to care for your incision. Check the incision area daily for signs of infection. This information is not intended to replace advice given to you by your health care provider. Make sure you discuss any questions you have with your health care provider. Document Revised: 05/07/2018 Document Reviewed: 03/24/2017 Elsevier Patient Education  2020 [employer]   Moderate Conscious Sedation, Adult, Care After These instructions provide you with information about caring for yourself after your procedure. Your health care provider may also give you more specific instructions. Your treatment has been planned according to current medical practices, but problems sometimes occur. Call your health care provider if you have any problems or questions after your procedure. What can I expect after the procedure? After your procedure, it is common:  To feel sleepy for several hours.  To feel clumsy and have poor balance for several hours.  To have poor judgment for several hours.  To vomit if you eat too soon. Follow these instructions at home: For at least 24 hours after the procedure:   Do not: ? Participate in activities where you could fall or become injured. ? Drive. ? Use heavy machinery. ? Drink alcohol. ? Take sleeping pills  or medicines that cause drowsiness. ? Make important decisions or sign legal documents. ? Take care of children on your own.  Rest. Eating and drinking  Follow the diet recommended by your health care provider.  If you vomit: ? Drink water, juice, or soup when you can drink without vomiting. ? Make sure you have little or no nausea before eating solid foods. General instructions  Have a responsible adult stay with you until you are awake and alert.  Take over-the-counter and prescription medicines only as told by your health care provider.  If you smoke, do not smoke without supervision.  Keep all follow-up visits as told by your health care provider. This is important. Contact a health care provider if:  You keep feeling nauseous or you keep vomiting.  You feel light-headed.  You develop a rash.  You have a fever. Get help right away if:  You have trouble breathing. This information is not intended to replace advice given to you by your health care   provider. Make sure you discuss any questions you have with your health care provider. Document Revised: 02/24/2017 Document Reviewed: 07/04/2015 Elsevier Patient Education  2020 [employer]  

## 2019-11-19 ENCOUNTER — Ambulatory Visit: Payer: 59 | Admitting: Radiation Oncology

## 2019-11-20 ENCOUNTER — Ambulatory Visit
Admission: RE | Admit: 2019-11-20 | Discharge: 2019-11-20 | Disposition: A | Discharge status: Home / Self Care | Payer: 59 | Source: Ambulatory Visit | Attending: Radiation Oncology | Admitting: Radiation Oncology

## 2019-11-20 ENCOUNTER — Other Ambulatory Visit: Payer: Self-pay

## 2019-11-20 DIAGNOSIS — Z510 Encounter for antineoplastic radiation therapy: Secondary | ICD-10-CM | POA: Diagnosis not present

## 2019-11-20 DIAGNOSIS — C7802 Secondary malignant neoplasm of left lung: Secondary | ICD-10-CM | POA: Diagnosis not present

## 2019-11-20 DIAGNOSIS — C20 Malignant neoplasm of rectum: Secondary | ICD-10-CM | POA: Diagnosis not present

## 2019-11-21 ENCOUNTER — Other Ambulatory Visit (HOSPITAL_COMMUNITY)
Admission: RE | Admit: 2019-11-21 | Discharge: 2019-11-21 | Disposition: A | Discharge status: Home / Self Care | Payer: 59 | Source: Ambulatory Visit | Attending: Radiation Oncology | Admitting: Radiation Oncology

## 2019-11-21 ENCOUNTER — Other Ambulatory Visit: Payer: Self-pay | Admitting: Radiation Oncology

## 2019-11-21 ENCOUNTER — Telehealth: Payer: Self-pay | Admitting: *Deleted

## 2019-11-21 DIAGNOSIS — C20 Malignant neoplasm of rectum: Secondary | ICD-10-CM | POA: Insufficient documentation

## 2019-11-21 LAB — BASIC METABOLIC PANEL
Anion gap: 8 (ref 5–15)
BUN: 19 mg/dL (ref 6–20)
CO2: 26 mmol/L (ref 22–32)
Calcium: 8.9 mg/dL (ref 8.9–10.3)
Chloride: 103 mmol/L (ref 98–111)
Creatinine, Ser: 0.77 mg/dL (ref 0.44–1.00)
GFR calc Af Amer: >60 mL/min (ref >60)
GFR calc non Af Amer: >60 mL/min (ref >60)
Glucose, Bld: 80 mg/dL (ref 70–99)
Potassium: 3.2 mmol/L — ABNORMAL LOW (ref 3.5–5.1)
Sodium: 137 mmol/L (ref 135–145)

## 2019-11-21 NOTE — Telephone Encounter (Signed)
Called patient to inform of lab appt. For 11-22-19 @ 1:30 pm, spoke with patient and she is aware of this appt.

## 2019-11-21 NOTE — Progress Notes (Signed)
Has armband been applied?  Yes  Does patient have an allergy to IV contrast dye?: No   Has patient ever received premedication for IV contrast dye?: n/a  Does patient take metformin?: no  If patient does take metformin when was the last dose: n/a  Date of lab work: 11/21/2019 BUN: 19 CR: 0.77 EGfr: >60  IV site: Left Chest port  Has IV site been added to flowsheet?  Yes

## 2019-11-22 ENCOUNTER — Encounter: Payer: Self-pay | Admitting: Radiation Oncology

## 2019-11-22 ENCOUNTER — Ambulatory Visit
Admission: RE | Admit: 2019-11-22 | Discharge: 2019-11-22 | Disposition: A | Discharge status: Home / Self Care | Payer: 59 | Source: Ambulatory Visit | Attending: Radiation Oncology | Admitting: Radiation Oncology

## 2019-11-22 ENCOUNTER — Ambulatory Visit: Payer: 59

## 2019-11-22 DIAGNOSIS — Z510 Encounter for antineoplastic radiation therapy: Secondary | ICD-10-CM | POA: Diagnosis not present

## 2019-11-22 DIAGNOSIS — C20 Malignant neoplasm of rectum: Secondary | ICD-10-CM

## 2019-11-22 DIAGNOSIS — C787 Secondary malignant neoplasm of liver and intrahepatic bile duct: Secondary | ICD-10-CM | POA: Diagnosis not present

## 2019-11-22 DIAGNOSIS — C7802 Secondary malignant neoplasm of left lung: Secondary | ICD-10-CM | POA: Diagnosis not present

## 2019-11-22 MED ORDER — HEPARIN SOD (PORK) LOCK FLUSH 100 UNIT/ML IV SOLN
500.0000 [IU] | Freq: Once | INTRAVENOUS | Status: AC
  Administered 2019-11-22: 500 [IU] via INTRAVENOUS

## 2019-11-22 MED ORDER — SODIUM CHLORIDE 0.9% FLUSH
10.0000 mL | Freq: Once | INTRAVENOUS | Status: AC
  Administered 2019-11-22: 10 mL via INTRAVENOUS

## 2019-11-26 ENCOUNTER — Ambulatory Visit: Payer: 59 | Admitting: Radiation Oncology

## 2019-11-27 ENCOUNTER — Ambulatory Visit: Payer: 59 | Admitting: Radiation Oncology

## 2019-11-29 ENCOUNTER — Encounter (HOSPITAL_COMMUNITY)
Admission: RE | Admit: 2019-11-29 | Discharge: 2019-11-29 | Disposition: A | Discharge status: Home / Self Care | Payer: 59 | Source: Ambulatory Visit | Attending: General Surgery | Admitting: General Surgery

## 2019-12-03 ENCOUNTER — Ambulatory Visit: Payer: 59 | Admitting: Radiation Oncology

## 2019-12-04 ENCOUNTER — Other Ambulatory Visit: Payer: Self-pay

## 2019-12-04 ENCOUNTER — Ambulatory Visit: Payer: 59 | Admitting: Radiation Oncology

## 2019-12-04 ENCOUNTER — Ambulatory Visit
Admission: RE | Admit: 2019-12-04 | Discharge: 2019-12-04 | Disposition: A | Discharge status: Home / Self Care | Payer: 59 | Source: Ambulatory Visit | Attending: Radiation Oncology | Admitting: Radiation Oncology

## 2019-12-04 DIAGNOSIS — Z510 Encounter for antineoplastic radiation therapy: Secondary | ICD-10-CM | POA: Insufficient documentation

## 2019-12-04 DIAGNOSIS — C787 Secondary malignant neoplasm of liver and intrahepatic bile duct: Secondary | ICD-10-CM | POA: Diagnosis not present

## 2019-12-04 DIAGNOSIS — C7802 Secondary malignant neoplasm of left lung: Secondary | ICD-10-CM | POA: Insufficient documentation

## 2019-12-04 DIAGNOSIS — C20 Malignant neoplasm of rectum: Secondary | ICD-10-CM | POA: Insufficient documentation

## 2019-12-05 ENCOUNTER — Ambulatory Visit: Payer: 59 | Admitting: Radiation Oncology

## 2019-12-06 ENCOUNTER — Ambulatory Visit: Payer: 59 | Admitting: Radiation Oncology

## 2019-12-06 ENCOUNTER — Ambulatory Visit
Admission: RE | Admit: 2019-12-06 | Discharge: 2019-12-06 | Disposition: A | Discharge status: Home / Self Care | Payer: 59 | Source: Ambulatory Visit | Attending: Radiation Oncology | Admitting: Radiation Oncology

## 2019-12-06 DIAGNOSIS — C20 Malignant neoplasm of rectum: Secondary | ICD-10-CM | POA: Diagnosis not present

## 2019-12-06 DIAGNOSIS — Z510 Encounter for antineoplastic radiation therapy: Secondary | ICD-10-CM | POA: Diagnosis not present

## 2019-12-06 DIAGNOSIS — C7802 Secondary malignant neoplasm of left lung: Secondary | ICD-10-CM | POA: Diagnosis not present

## 2019-12-09 ENCOUNTER — Ambulatory Visit
Admission: RE | Admit: 2019-12-09 | Discharge: 2019-12-09 | Disposition: A | Discharge status: Home / Self Care | Payer: 59 | Source: Ambulatory Visit | Attending: Radiation Oncology | Admitting: Radiation Oncology

## 2019-12-09 ENCOUNTER — Other Ambulatory Visit: Payer: Self-pay

## 2019-12-09 DIAGNOSIS — C7802 Secondary malignant neoplasm of left lung: Secondary | ICD-10-CM | POA: Diagnosis not present

## 2019-12-09 DIAGNOSIS — Z510 Encounter for antineoplastic radiation therapy: Secondary | ICD-10-CM | POA: Diagnosis not present

## 2019-12-09 DIAGNOSIS — C20 Malignant neoplasm of rectum: Secondary | ICD-10-CM | POA: Diagnosis not present

## 2019-12-10 ENCOUNTER — Telehealth: Payer: Self-pay | Admitting: Radiation Oncology

## 2019-12-10 NOTE — Telephone Encounter (Signed)
The patient called me today, and is currently undergoing stereotactic body radiotherapy to her liver for her history of metastatic rectal cancer.  She received the third out of her five planned treatments to the liver yesterday, and states that this morning her son tested positive for Covid.  He is 56 years old, unvaccinated, and lives with them.  They have started taking precautions at home and he plans to isolate in the second level of their home.  Both she and her husband are Covid employees undergoing today at the direction of help at work for PCR testing.  I discussed her case with Dr. Lisbeth Renshaw.  It is uncertain when the patient's son would have exposed them since they live together.  We reviewed the CDC guidance for testing 3 to 5 days following known exposure, so I told her I was not sure if infection prevention would agree or not, but she may need to be retested even if she is found to be negative today given her disease history, Dr. Lisbeth Renshaw would favor still treating her tomorrow.  If she does test positive she would be treated at the latest appointment of the day, with staff taking Covid precautions and donning PPE.  I also contacted [facility] our department director who will likely review department policy and speak with the infection prevention as well to determine the protocol for her treatment tomorrow.  The patient will notify us as soon as she receives results, and I will notify her as well as our department.

## 2019-12-11 ENCOUNTER — Ambulatory Visit
Admission: RE | Admit: 2019-12-11 | Discharge: 2019-12-11 | Disposition: A | Discharge status: Home / Self Care | Payer: 59 | Source: Ambulatory Visit | Attending: Radiation Oncology | Admitting: Radiation Oncology

## 2019-12-11 ENCOUNTER — Other Ambulatory Visit: Payer: Self-pay

## 2019-12-11 DIAGNOSIS — C20 Malignant neoplasm of rectum: Secondary | ICD-10-CM | POA: Diagnosis not present

## 2019-12-11 DIAGNOSIS — Z510 Encounter for antineoplastic radiation therapy: Secondary | ICD-10-CM | POA: Diagnosis not present

## 2019-12-11 DIAGNOSIS — C7802 Secondary malignant neoplasm of left lung: Secondary | ICD-10-CM | POA: Diagnosis not present

## 2019-12-12 MED FILL — ALPRAZolam 0.5 MG TABS: 0.5 | 30 days supply | Qty: 90 | Fill #3

## 2019-12-13 ENCOUNTER — Other Ambulatory Visit: Payer: Self-pay

## 2019-12-13 ENCOUNTER — Encounter: Payer: Self-pay | Admitting: Radiation Oncology

## 2019-12-13 ENCOUNTER — Ambulatory Visit
Admission: RE | Admit: 2019-12-13 | Discharge: 2019-12-13 | Disposition: A | Discharge status: Home / Self Care | Payer: 59 | Source: Ambulatory Visit | Attending: Radiation Oncology | Admitting: Radiation Oncology

## 2019-12-13 DIAGNOSIS — C7802 Secondary malignant neoplasm of left lung: Secondary | ICD-10-CM | POA: Diagnosis not present

## 2019-12-13 DIAGNOSIS — C787 Secondary malignant neoplasm of liver and intrahepatic bile duct: Secondary | ICD-10-CM | POA: Diagnosis not present

## 2019-12-13 DIAGNOSIS — C20 Malignant neoplasm of rectum: Secondary | ICD-10-CM | POA: Diagnosis not present

## 2019-12-13 DIAGNOSIS — Z510 Encounter for antineoplastic radiation therapy: Secondary | ICD-10-CM | POA: Diagnosis not present

## 2019-12-16 ENCOUNTER — Ambulatory Visit: Payer: 59 | Admitting: Radiation Oncology

## 2019-12-16 NOTE — Progress Notes (Signed)
  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  The patient received 54 Gray in 3 fractions using a SBRT/IMRT technique, with 3 fields.  Narrative: The patient tolerated radiation treatment relatively well.   No unexpected difficulties.  The patient's breathing did not significantly change during the course of the treatment.  Plan: The patient has completed radiation treatment. The patient will return to radiation oncology clinic for routine followup in one month. I advised the patient to call or return sooner if they have any questions or concerns related to their recovery or treatment. ________________________________  Jodelle Gross, M.D., Ph.D.

## 2019-12-23 ENCOUNTER — Encounter (HOSPITAL_COMMUNITY): Admission: RE | Admit: 2019-12-23 | Payer: 59 | Source: Ambulatory Visit

## 2019-12-25 ENCOUNTER — Other Ambulatory Visit: Payer: Self-pay

## 2019-12-25 ENCOUNTER — Encounter (HOSPITAL_COMMUNITY)
Admission: RE | Admit: 2019-12-25 | Discharge: 2019-12-25 | Disposition: A | Discharge status: Home / Self Care | Payer: 59 | Attending: Hematology | Admitting: Hematology

## 2019-12-25 DIAGNOSIS — C787 Secondary malignant neoplasm of liver and intrahepatic bile duct: Secondary | ICD-10-CM | POA: Diagnosis not present

## 2019-12-25 DIAGNOSIS — C20 Malignant neoplasm of rectum: Secondary | ICD-10-CM | POA: Insufficient documentation

## 2019-12-25 MED ORDER — HEPARIN SOD (PORK) LOCK FLUSH 100 UNIT/ML IV SOLN
500.0000 [IU] | Freq: Once | INTRAVENOUS | Status: AC
  Administered 2019-12-25: 500 [IU] via INTRAVENOUS

## 2019-12-25 MED ORDER — SODIUM CHLORIDE 0.9 % IV SOLN: INTRAVENOUS | Status: AC

## 2019-12-26 MED FILL — DICYCLOMINE 10 MG CAPSULE: 10 | 30 days supply | Qty: 90 | Fill #3

## 2019-12-26 MED FILL — ARMOUR THYROID 60 MG TABLET: 60 | 90 days supply | Qty: 90 | Fill #1

## 2020-01-10 NOTE — Progress Notes (Signed)
  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  The patient received 60 Gray in 5 fractions using a SBRT/IMRT technique, with 3 fields.  Narrative: The patient tolerated radiation treatment relatively well.   No unexpected difficulties.  The patient's breathing did not significantly change during the course of the treatment.  Plan: The patient has completed radiation treatment. The patient will return to radiation oncology clinic for routine followup in one month. I advised the patient to call or return sooner if they have any questions or concerns related to their recovery or treatment. ________________________________  Jodelle Gross, M.D., Ph.D.

## 2020-01-20 ENCOUNTER — Other Ambulatory Visit (INDEPENDENT_AMBULATORY_CARE_PROVIDER_SITE_OTHER): Payer: Self-pay | Admitting: *Deleted

## 2020-01-20 ENCOUNTER — Other Ambulatory Visit: Payer: Self-pay

## 2020-01-20 ENCOUNTER — Inpatient Hospital Stay (HOSPITAL_COMMUNITY): Payer: 59 | Attending: Hematology

## 2020-01-20 ENCOUNTER — Telehealth (INDEPENDENT_AMBULATORY_CARE_PROVIDER_SITE_OTHER): Payer: Self-pay | Admitting: *Deleted

## 2020-01-20 ENCOUNTER — Encounter (INDEPENDENT_AMBULATORY_CARE_PROVIDER_SITE_OTHER): Payer: Self-pay | Admitting: *Deleted

## 2020-01-20 ENCOUNTER — Other Ambulatory Visit (INDEPENDENT_AMBULATORY_CARE_PROVIDER_SITE_OTHER): Payer: Self-pay | Admitting: Internal Medicine

## 2020-01-20 DIAGNOSIS — D72819 Decreased white blood cell count, unspecified: Secondary | ICD-10-CM | POA: Diagnosis not present

## 2020-01-20 DIAGNOSIS — C787 Secondary malignant neoplasm of liver and intrahepatic bile duct: Secondary | ICD-10-CM | POA: Insufficient documentation

## 2020-01-20 DIAGNOSIS — R110 Nausea: Secondary | ICD-10-CM | POA: Insufficient documentation

## 2020-01-20 DIAGNOSIS — C20 Malignant neoplasm of rectum: Secondary | ICD-10-CM | POA: Insufficient documentation

## 2020-01-20 DIAGNOSIS — R059 Cough, unspecified: Secondary | ICD-10-CM | POA: Insufficient documentation

## 2020-01-20 DIAGNOSIS — D696 Thrombocytopenia, unspecified: Secondary | ICD-10-CM | POA: Insufficient documentation

## 2020-01-20 DIAGNOSIS — Z8042 Family history of malignant neoplasm of prostate: Secondary | ICD-10-CM | POA: Diagnosis not present

## 2020-01-20 DIAGNOSIS — F32A Depression, unspecified: Secondary | ICD-10-CM | POA: Diagnosis not present

## 2020-01-20 DIAGNOSIS — I700 Atherosclerosis of aorta: Secondary | ICD-10-CM | POA: Insufficient documentation

## 2020-01-20 DIAGNOSIS — Z808 Family history of malignant neoplasm of other organs or systems: Secondary | ICD-10-CM | POA: Diagnosis not present

## 2020-01-20 DIAGNOSIS — I719 Aortic aneurysm of unspecified site, without rupture: Secondary | ICD-10-CM | POA: Diagnosis not present

## 2020-01-20 DIAGNOSIS — E039 Hypothyroidism, unspecified: Secondary | ICD-10-CM | POA: Diagnosis not present

## 2020-01-20 DIAGNOSIS — C7802 Secondary malignant neoplasm of left lung: Secondary | ICD-10-CM | POA: Insufficient documentation

## 2020-01-20 DIAGNOSIS — R1012 Left upper quadrant pain: Secondary | ICD-10-CM | POA: Insufficient documentation

## 2020-01-20 DIAGNOSIS — R200 Anesthesia of skin: Secondary | ICD-10-CM | POA: Diagnosis not present

## 2020-01-20 DIAGNOSIS — Z87891 Personal history of nicotine dependence: Secondary | ICD-10-CM | POA: Insufficient documentation

## 2020-01-20 DIAGNOSIS — R5383 Other fatigue: Secondary | ICD-10-CM | POA: Diagnosis not present

## 2020-01-20 DIAGNOSIS — Z801 Family history of malignant neoplasm of trachea, bronchus and lung: Secondary | ICD-10-CM | POA: Diagnosis not present

## 2020-01-20 DIAGNOSIS — Z79899 Other long term (current) drug therapy: Secondary | ICD-10-CM | POA: Diagnosis not present

## 2020-01-20 DIAGNOSIS — Z9221 Personal history of antineoplastic chemotherapy: Secondary | ICD-10-CM | POA: Insufficient documentation

## 2020-01-20 DIAGNOSIS — Z923 Personal history of irradiation: Secondary | ICD-10-CM | POA: Diagnosis not present

## 2020-01-20 DIAGNOSIS — Z8249 Family history of ischemic heart disease and other diseases of the circulatory system: Secondary | ICD-10-CM | POA: Insufficient documentation

## 2020-01-20 DIAGNOSIS — Z85850 Personal history of malignant neoplasm of thyroid: Secondary | ICD-10-CM | POA: Diagnosis not present

## 2020-01-20 LAB — COMPREHENSIVE METABOLIC PANEL
ALT: 19 U/L (ref 0–44)
AST: 29 U/L (ref 15–41)
Albumin: 4 g/dL (ref 3.5–5.0)
Alkaline Phosphatase: 102 U/L (ref 38–126)
Anion gap: 8 (ref 5–15)
BUN: 20 mg/dL (ref 6–20)
CO2: 26 mmol/L (ref 22–32)
Calcium: 8.9 mg/dL (ref 8.9–10.3)
Chloride: 102 mmol/L (ref 98–111)
Creatinine, Ser: 0.71 mg/dL (ref 0.44–1.00)
GFR, Estimated: >60 mL/min (ref >60)
Glucose, Bld: 86 mg/dL (ref 70–99)
Potassium: 3.3 mmol/L — ABNORMAL LOW (ref 3.5–5.1)
Sodium: 136 mmol/L (ref 135–145)
Total Bilirubin: 3.3 mg/dL — ABNORMAL HIGH (ref 0.3–1.2)
Total Protein: 6.7 g/dL (ref 6.5–8.1)

## 2020-01-20 LAB — CBC WITH DIFFERENTIAL/PLATELET
Abs Immature Granulocytes: 0.01 10*3/uL (ref 0.00–0.07)
Basophils Absolute: 0 10*3/uL (ref 0.0–0.1)
Basophils Relative: 0 %
Eosinophils Absolute: 0 10*3/uL (ref 0.0–0.5)
Eosinophils Relative: 0 %
HCT: 30.6 % — ABNORMAL LOW (ref 36.0–46.0)
Hemoglobin: 10.6 g/dL — ABNORMAL LOW (ref 12.0–15.0)
Immature Granulocytes: 0 %
Lymphocytes Relative: 9 %
Lymphs Abs: 0.2 10*3/uL — ABNORMAL LOW (ref 0.7–4.0)
MCH: 34.8 pg — ABNORMAL HIGH (ref 26.0–34.0)
MCHC: 34.6 g/dL (ref 30.0–36.0)
MCV: 100.3 fL — ABNORMAL HIGH (ref 80.0–100.0)
Monocytes Absolute: 0.2 10*3/uL (ref 0.1–1.0)
Monocytes Relative: 8 %
Neutro Abs: 2 10*3/uL (ref 1.7–7.7)
Neutrophils Relative %: 83 %
Platelets: 75 10*3/uL — ABNORMAL LOW (ref 150–400)
RBC: 3.05 MIL/uL — ABNORMAL LOW (ref 3.87–5.11)
RDW: 14.9 % (ref 11.5–15.5)
WBC: 2.5 10*3/uL — ABNORMAL LOW (ref 4.0–10.5)
nRBC: 0 % (ref 0.0–0.2)

## 2020-01-20 LAB — MAGNESIUM: Magnesium: 2.2 mg/dL (ref 1.7–2.4)

## 2020-01-20 LAB — LACTATE DEHYDROGENASE: LDH: 154 U/L (ref 98–192)

## 2020-01-20 MED ORDER — AMITRIPTYLINE HCL 10 MG PO TABS
10.0000 mg | ORAL_TABLET | Freq: Two times a day (BID) | ORAL | 2 refills | Status: DC
Start: 2020-01-20 — End: 2020-01-20

## 2020-01-20 MED FILL — AMITRIPTYLINE HCL 10 MG TAB: 10 | 30 days supply | Qty: 60 | Fill #0

## 2020-01-20 NOTE — Telephone Encounter (Signed)
Dr.Rehman has been made aware. Waiting for a okay to refill this medication.

## 2020-01-20 NOTE — Telephone Encounter (Signed)
Patient needs refill on Amitriptyline sent to [facility] - she reached out to Dr Laural Golden and he told he we would take care of it

## 2020-01-20 NOTE — Telephone Encounter (Signed)
Dr.Rehman approved renewal of prescription. Patient was sent a note in [place]

## 2020-01-21 LAB — CEA: CEA: 0.5 ng/mL (ref 0.0–4.7)

## 2020-01-22 ENCOUNTER — Ambulatory Visit (HOSPITAL_COMMUNITY)
Admission: RE | Admit: 2020-01-22 | Discharge: 2020-01-22 | Disposition: A | Discharge status: Home / Self Care | Payer: 59 | Source: Ambulatory Visit | Attending: Hematology | Admitting: Hematology

## 2020-01-22 ENCOUNTER — Other Ambulatory Visit: Payer: Self-pay

## 2020-01-22 DIAGNOSIS — K6289 Other specified diseases of anus and rectum: Secondary | ICD-10-CM | POA: Diagnosis not present

## 2020-01-22 DIAGNOSIS — I700 Atherosclerosis of aorta: Secondary | ICD-10-CM | POA: Diagnosis not present

## 2020-01-22 DIAGNOSIS — K7689 Other specified diseases of liver: Secondary | ICD-10-CM | POA: Diagnosis not present

## 2020-01-22 DIAGNOSIS — C20 Malignant neoplasm of rectum: Secondary | ICD-10-CM | POA: Diagnosis not present

## 2020-01-22 DIAGNOSIS — Z85850 Personal history of malignant neoplasm of thyroid: Secondary | ICD-10-CM | POA: Diagnosis not present

## 2020-01-22 DIAGNOSIS — E890 Postprocedural hypothyroidism: Secondary | ICD-10-CM | POA: Diagnosis not present

## 2020-01-22 DIAGNOSIS — I712 Thoracic aortic aneurysm, without rupture: Secondary | ICD-10-CM | POA: Diagnosis not present

## 2020-01-22 MED ORDER — IOHEXOL 300 MG/ML  SOLN
75.0000 mL | Freq: Once | INTRAMUSCULAR | Status: AC | PRN
  Administered 2020-01-22: 75 mL via INTRAVENOUS

## 2020-01-23 ENCOUNTER — Inpatient Hospital Stay (HOSPITAL_BASED_OUTPATIENT_CLINIC_OR_DEPARTMENT_OTHER): Payer: 59 | Admitting: Hematology

## 2020-01-23 VITALS — BP 102/78 | HR 108 | Temp 97.2°F | Resp 18 | Wt 110.6 lb

## 2020-01-23 DIAGNOSIS — Z79899 Other long term (current) drug therapy: Secondary | ICD-10-CM | POA: Diagnosis not present

## 2020-01-23 DIAGNOSIS — F32A Depression, unspecified: Secondary | ICD-10-CM | POA: Diagnosis not present

## 2020-01-23 DIAGNOSIS — Z8249 Family history of ischemic heart disease and other diseases of the circulatory system: Secondary | ICD-10-CM | POA: Diagnosis not present

## 2020-01-23 DIAGNOSIS — I700 Atherosclerosis of aorta: Secondary | ICD-10-CM | POA: Diagnosis not present

## 2020-01-23 DIAGNOSIS — Z85850 Personal history of malignant neoplasm of thyroid: Secondary | ICD-10-CM | POA: Diagnosis not present

## 2020-01-23 DIAGNOSIS — C20 Malignant neoplasm of rectum: Secondary | ICD-10-CM | POA: Diagnosis not present

## 2020-01-23 DIAGNOSIS — Z87891 Personal history of nicotine dependence: Secondary | ICD-10-CM | POA: Diagnosis not present

## 2020-01-23 DIAGNOSIS — Z923 Personal history of irradiation: Secondary | ICD-10-CM | POA: Diagnosis not present

## 2020-01-23 DIAGNOSIS — Z9221 Personal history of antineoplastic chemotherapy: Secondary | ICD-10-CM | POA: Diagnosis not present

## 2020-01-23 NOTE — Progress Notes (Signed)
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 FOLFOX & vectibix x 7 cycles from 03/14/2018 to 06/27/2018. 2. XRT with Xeloda from 07/30/2018 to 09/06/2018. 3. Right liver lesion microwave ablation on 10/15/2018. 4. Low anterior resection and diverting ileostomy on 12/07/2018. 5. SBRT to liver 60 Gy in 5 fractions from 11/15/2019 to 12/13/2019.  NGS Results: Foundation 1 K-RAS/NRAS wild-type, MS--stable, TMB--low  CURRENT THERAPY: Observation  BRIEF ONCOLOGIC HISTORY:  Oncology History  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[place])  02/26/2018 Initial Diagnosis   Rectal cancer ([place])   03/13/2018 Cancer Staging   Staging form: Colon and Rectum, AJCC 8th Edition - Clinical stage from 03/13/2018: Stage IVA (cT3, cN1b, cM1a) - Signed by Derek Jack, MD on 03/13/2018   03/14/2018 - 06/29/2018 Chemotherapy   The patient had palonosetron (ALOXI) injection 0.25 mg, 0.25 mg, Intravenous,  Once, 7 of 8 cycles Administration: 0.25 mg (03/14/2018), 0.25 mg (03/27/2018), 0.25 mg (04/18/2018), 0.25 mg (05/02/2018), 0.25 mg (05/16/2018), 0.25 mg (06/05/2018), 0.25 mg (06/27/2018) leucovorin 800 mg in dextrose 5 % 250 mL infusion, 772 mg, Intravenous,  Once, 7 of 8 cycles Administration: 800 mg (03/14/2018), 800 mg (03/27/2018), 800 mg (05/02/2018), 800 mg (05/16/2018), 700 mg (04/18/2018), 800 mg (06/05/2018), 800 mg (06/27/2018) oxaliplatin (ELOXATIN) 165 mg in dextrose 5 % 500 mL chemo infusion, 85 mg/m2 = 165 mg, Intravenous,  Once, 6 of 7 cycles Dose modification: 68 mg/m2 (80 % of original dose 85 mg/m2, Cycle 4, Reason: Provider Judgment) Administration: 165 mg (03/14/2018), 165 mg (03/27/2018), 130 mg (05/02/2018), 130 mg (05/16/2018), 130 mg (06/05/2018), 130 mg  (06/27/2018) panitumumab (VECTIBIX) 500 mg in sodium chloride 0.9 % 100 mL chemo infusion, 480 mg, Intravenous,  Once, 4 of 5 cycles Administration: 500 mg (04/18/2018), 480 mg (05/02/2018), 480 mg (05/16/2018), 480 mg (06/05/2018) fluorouracil (ADRUCIL) chemo injection 750 mg, 400 mg/m2 = 750 mg (100 % of original dose 400 mg/m2), Intravenous,  Once, 6 of 7 cycles Dose modification: 400 mg/m2 (original dose 400 mg/m2, Cycle 1), 400 mg/m2 (original dose 400 mg/m2, Cycle 3) Administration: 750 mg (03/14/2018), 750 mg (04/18/2018), 750 mg (05/02/2018), 750 mg (05/16/2018), 750 mg (06/05/2018), 750 mg (06/27/2018) fosaprepitant (EMEND) 150 mg, dexamethasone (DECADRON) 12 mg in sodium chloride 0.9 % 145 mL IVPB, , Intravenous,  Once, 4 of 5 cycles Administration:  (05/02/2018),  (05/16/2018),  (06/05/2018),  (06/27/2018) fluorouracil (ADRUCIL) 4,650 mg in sodium chloride 0.9 % 57 mL chemo infusion, 2,400 mg/m2 = 4,650 mg, Intravenous, 1 Day/Dose, 7 of 8 cycles Administration: 4,650 mg (03/14/2018), 4,650 mg (03/27/2018), 4,650 mg (04/18/2018), 4,650 mg (05/02/2018), 4,650 mg (05/16/2018), 4,650 mg (06/05/2018), 4,650 mg (06/27/2018)  for chemotherapy treatment.      CANCER STAGING: Cancer Staging Malignant neoplasm of rectum [facility]) Staging form: Colon and Rectum, AJCC 8th Edition - Clinical stage from 03/13/2018: Stage IVA (cT3, cN1b, cM1a) - Signed by Derek Jack, MD on 03/13/2018   INTERVAL HISTORY:  [name], a 54 y.o. female, returns for routine follow-up of her metastatic rectal cancer. Cindel was last seen on 10/22/2019.   Today she is accompanied by her husband and reports feeling poorly. She finished radiation on 9/17 and has been feeling fatigued and not wanting to do anything since finishing radiation. Her BM's  have finally stabilized. Her energy levels and appetite have started improving this week; she is drinking 1-2 cans of Ensure Clear daily. She reports having epigastric and LUQ abdominal  pain which started on 10/7; she denies any relation to food. She has not tried Maalox or Pepto Bismol. She tends to get nauseated by the evening and takes Compazine, which helps blunt the nausea.   REVIEW OF SYSTEMS:  Review of Systems  Constitutional: Positive for appetite change (30%) and fatigue (60%).  Respiratory: Positive for cough.   Gastrointestinal: Positive for abdominal pain (3/10 epigastric & LUQ abdominal pain) and nausea (controlled w/ Compazine).  Neurological: Positive for numbness (& tingling in L foot).  Psychiatric/Behavioral: Positive for depression.  All other systems reviewed and are negative.   PAST MEDICAL/SURGICAL HISTORY:  Past Medical History:  Diagnosis Date  . Anxiety   . Cancer [facility]) 2013   thyroid  . Endometrial polyp   . Endometrial thickening on ultra sound   . GERD (gastroesophageal reflux disease)   . 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 H/O Hashimoto thyroiditis   . History of chemotherapy   . History of radiation therapy   . 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 Hyperlipidemia 11/09/2015  . Hypothyroidism   . 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[place]) 02/26/2018   Past Surgical History:  Procedure Laterality Date  . BIOPSY  02/19/2018   Procedure: BIOPSY;  Surgeon: Rogene Houston, MD;  [facility];  Service: Endoscopy;;  rectum  . COLONOSCOPY N/A 08/20/2015   Procedure: COLONOSCOPY;  Surgeon: Rogene Houston, MD;  [facility];  Service: Endoscopy;  Laterality: N/A;  730  . [place] ENDOMETRIAL ABLATION  08-13-2004  . DIVERTING ILEOSTOMY N/A 12/07/2018   Procedure: DIVERTING LOOP ILEOSTOMY;  Surgeon: Leighton Ruff, MD;  [facility]: [facility];  Service: General;  Laterality: N/A;  . FLEXIBLE SIGMOIDOSCOPY N/A 02/19/2018   Procedure: FLEXIBLE SIGMOIDOSCOPY;  Surgeon: Rogene Houston, MD;  [facility];  Service: Endoscopy;  Laterality: N/A;  . HYSTEROSCOPY WITH D & C N/A 04/30/2015   Procedure: DILATATION AND CURETTAGE / INTENDED HYSTEROSCOPY;  Surgeon: Dian Queen, MD;  Location: [facility];  Service: Gynecology;  Laterality: N/A;  . ILEOSTOMY CLOSURE N/A 04/17/2019   Procedure: LOOP ILEOSTOMY REVERSAL;  Surgeon: Leighton Ruff, MD;  [facility]: [facility];  Service: General;  Laterality: N/A;  . IR US GUIDE BX ASP/DRAIN  03/09/2018  . LAPAROSCOPIC CHOLECYSTECTOMY  04/1996  . LAPAROSCOPY N/A 12/11/2018   Procedure: LAPAROSCOPY  ILEOSTOMY REVISION AND ABDOMINAL WASHOUT;  Surgeon: Leighton Ruff, MD;  [facility];  Service: General;  Laterality: N/A;  . Liver Microwave   10/17/2018  . POLYPECTOMY  08/20/2015   Procedure: POLYPECTOMY;  Surgeon: Rogene Houston, MD;  [facility];  Service: Endoscopy;;  Splenic flexure polypectomy  . POLYPECTOMY  02/19/2018   Procedure: POLYPECTOMY;  Surgeon: Rogene Houston, MD;  [facility];  Service: Endoscopy;;  rectum  . PORTACATH PLACEMENT N/A 03/14/2018   Procedure: INSERTION PORT-A-CATH;  Surgeon: Aviva Signs, MD;  Location: [facility];  Service: General;  Laterality: N/A;  . REDUCTION INCARCERATED UTERUS  06-20-2000   intrauterine preg. 13 wks /  urinary retention  . THYROID LOBECTOMY  11/24/2011   Procedure: THYROID LOBECTOMY;  Surgeon:  Earnstine Regal, MD;  [facility];  Service: General;  Laterality: Left;  Left Thyroid Lobectomy  . TUBAL LIGATION  2002  . XI ROBOTIC ASSISTED LOWER ANTERIOR RESECTION N/A 12/07/2018   Procedure: XI ROBOTIC ASSISTED LOWER ANTERIOR RESECTION, SPENIC FLEXURE IMMOBILIZATION, COLOANAL ANASTOMOSIS, RIGID PROCTOSCOPY;  Surgeon: Leighton Ruff, MD;  Location[facility];  Service: General;  Laterality: N/A;    SOCIAL HISTORY:  Social History   Socioeconomic History  . Marital status: Married    Spouse name: Not on file  . Number of children: 4   . Years of education: Not on file  . Highest education level: Not on file  Occupational History    Comment: Scheduler at [employer]  . Smoking status: Former Smoker    Packs/day: 0.25    Years: 10.00    Pack years: 2.50    Types: Cigarettes    Quit date: 10/31/1991    Years since quitting: 28.2  . Smokeless tobacco: Never Used  Vaping Use  . Vaping Use: Never used  Substance and Sexual Activity  . Alcohol use: No  . Drug use: No  . Sexual activity: Not Currently  Other Topics Concern  . Not on file  Social History Narrative   Lives with husband [name] and 1 year old son [name]   Husband is Interior and spatial designer of [employer]   Social Determinants of Health   Financial Resource Strain:   . Difficulty of Paying Living Expenses: Not on file  Food Insecurity:   . Worried About Charity fundraiser in the Last Year: Not on file  . Ran Out of Food in the Last Year: Not on file  Transportation Needs:   . Lack of Transportation (Medical): Not on file  . Lack of Transportation (Non-Medical): Not on file  Physical Activity:   . Days of Exercise per Week: Not on file  . Minutes of Exercise per Session: Not on file  Stress:   . Feeling of Stress : Not on file  Social Connections:   . Frequency of Communication with Friends and Family: Not on file  . Frequency of Social Gatherings with Friends and Family: Not on file  . Attends Religious Services: Not on file  . Active Member of Clubs or Organizations: Not on file  . Attends Archivist Meetings: Not on file  . Marital Status: Not on file  Intimate Partner Violence:   . Fear of Current or Ex-Partner: Not on file  . Emotionally Abused: Not on file  . Physically Abused: Not on file  . Sexually Abused: Not on file    FAMILY HISTORY:  Family History  Problem Relation Age of Onset  . Hypertension Mother   . Hypertension Father   . Prostate cancer Father   . Cancer Maternal Aunt        spine/back  . Cancer  Paternal Grandfather        lung  . Healthy Son   . Healthy Son   . Healthy Son   . Healthy Son   . Colon cancer Neg Hx     CURRENT MEDICATIONS:  Current Outpatient Medications  Medication Sig Dispense Refill  . ALPRAZolam (XANAX) 0.5 MG tablet Take one tablet tid 90 tablet 5  . amitriptyline (ELAVIL) 10 MG tablet Take 1 tablet (10 mg total) by mouth in the morning and at bedtime. 60 tablet 2  . ARMOUR THYROID 60 MG tablet Take 60 mg by mouth every  morning.    . dicyclomine (BENTYL) 10 MG capsule Take 1 capsule (10 mg total) by mouth 3 (three) times daily before meals. 90 capsule 5  . fluticasone (CUTIVATE) 0.05 % cream Apply 1 application topically daily as needed (irritatiton).    Marland Kitchen loperamide (IMODIUM) 2 MG capsule Take 1 capsule (2 mg total) by mouth 3 (three) times daily. 30 capsule 0  . Multiple Vitamin (MULTI-VITAMIN) tablet Take by mouth.    . ondansetron (ZOFRAN) 4 MG tablet Take 1 tablet (4 mg total) by mouth every 8 (eight) hours as needed for nausea or vomiting. 30 tablet 2  . OVER THE COUNTER MEDICATION Multivitamin w/iron    . prochlorperazine (COMPAZINE) 10 MG tablet Take 1 tablet (10 mg total) by mouth every 6 (six) hours as needed for nausea or vomiting. 30 tablet 2  . lidocaine-prilocaine (EMLA) cream Apply small amount to port site one hour prior to appointment and cover with plastic wrap. (Patient not taking: Reported on 01/23/2020) 30 g 3   No current facility-administered medications for this visit.   Facility-Administered Medications Ordered in Other Visits  Medication Dose Route Frequency Provider Last Rate Last Admin  . clindamycin (CLEOCIN) 900 mg in dextrose 5 % 50 mL IVPB  900 mg Intravenous 60 min Pre-Op Leighton Ruff, MD       And  . gentamicin (GARAMYCIN) 350 mg in dextrose 5 % 50 mL IVPB  5 mg/kg Intravenous 60 min Pre-Op Leighton Ruff, MD      . clindamycin (CLEOCIN) 900 mg in dextrose 5 % 50 mL IVPB  900 mg Intravenous 60 min Pre-Op Leighton Ruff,  MD       And  . gentamicin (GARAMYCIN) 5 mg/kg in dextrose 5 % 50 mL IVPB  5 mg/kg Intravenous 60 min Pre-Op Leighton Ruff, MD      . sodium chloride 0.9 % 1,000 mL with potassium chloride 20 mEq, magnesium sulfate 2 g infusion   Intravenous Once Derek Jack, MD        ALLERGIES:  Allergies  Allergen Reactions  . Demerol [Meperidine] Itching    All over the body.  . Penicillins Hives     childhood, does not remember if it spread all over the body or not. Did it involve swelling of the face/tongue/throat, SOB, or low BP? No Did it involve sudden or severe rash/hives, skin peeling, or any reaction on the inside of your mouth or nose? Yes Did you need to seek medical attention at a hospital or doctor's office? Yes When did it last happen?childhood allergy If all above answers are "NO", may proceed with cephalosporin use.   . Other Hives and Other (See Comments)    Fresh coconut   . Vancomycin Rash    Will need Benadryl prior to administration per IV. "Red man syndrome"    PHYSICAL EXAM:  Performance status (ECOG): 0 - Asymptomatic  Vitals:   01/23/20 1556  BP: 102/78  Pulse: (!) 108  Resp: 18  Temp: (!) 97.2 F (36.2 C)  SpO2: 96%   Wt Readings from Last 3 Encounters:  01/23/20 110 lb 9.6 oz (50.2 kg)  11/22/19 116 lb 3.2 oz (52.7 kg)  11/18/19 120 lb (54.4 kg)   Physical Exam Vitals reviewed.  Constitutional:      Appearance: Normal appearance.  Eyes:     General: Scleral icterus present.  Abdominal:     Palpations: Abdomen is soft. There is no splenomegaly or mass.     Tenderness: There is  abdominal tenderness in the epigastric area and left upper quadrant.  Neurological:     General: No focal deficit present.     Mental Status: She is alert and oriented to person, place, and time.  Psychiatric:        Mood and Affect: Mood normal.        Behavior: Behavior normal.      LABORATORY DATA:  I have reviewed the labs as listed.  CBC Latest Ref  Rng & Units 01/20/2020 11/18/2019 10/18/2019  WBC 4.0 - 10.5 K/uL 2.5(L) 3.1(L) 2.7(L)  Hemoglobin 12.0 - 15.0 g/dL 10.6(L) 11.3(L) 11.2(L)  Hematocrit 36 - 46 % 30.6(L) 32.1(L) 32.1(L)  Platelets 150 - 400 K/uL 75(L) 82(L) 82(L)   CMP Latest Ref Rng & Units 01/20/2020 11/21/2019 10/18/2019  Glucose 70 - 99 mg/dL 86 80 112(H)  BUN 6 - 20 mg/dL $Remove'20 19 17  'ocVCZGn$ Creatinine 0.44 - 1.00 mg/dL 0.71 0.77 0.82  Sodium 135 - 145 mmol/L 136 137 139  Potassium 3.5 - 5.1 mmol/L 3.3(L) 3.2(L) 3.3(L)  Chloride 98 - 111 mmol/L 102 103 105  CO2 22 - 32 mmol/L $RemoveB'26 26 26  'tVJGxGGL$ Calcium 8.9 - 10.3 mg/dL 8.9 8.9 8.9  Total Protein 6.5 - 8.1 g/dL 6.7 - 7.0  Total Bilirubin 0.3 - 1.2 mg/dL 3.3(H) - 2.7(H)  Alkaline Phos 38 - 126 U/L 102 - 97  AST 15 - 41 U/L 29 - 25  ALT 0 - 44 U/L 19 - 16   Lab Results  Component Value Date   LDH 154 01/20/2020   Lab Results  Component Value Date   CEA1 0.5 01/20/2020   CEA1 2.1 10/18/2019   CEA1 2.4 07/15/2019    DIAGNOSTIC IMAGING:  I have independently reviewed the scans and discussed with the patient. CT Abdomen Pelvis W Wo Contrast  Result Date: 01/22/2020 CLINICAL DATA:  Metastatic colorectal cancer staging, status post colon resection, chemotherapy, radiotherapy, history of thyroid cancer status post lobectomy EXAM: CT CHEST WITH CONTRAST CT ABDOMEN AND PELVIS WITH AND WITHOUT CONTRAST TECHNIQUE: Multidetector CT imaging of the chest was performed during intravenous contrast administration. Multidetector CT imaging of the abdomen and pelvis was performed following the standard protocol before and during bolus administration of intravenous contrast. CONTRAST:  38mL OMNIPAQUE IOHEXOL 300 MG/ML SOLN, additional oral enteric contrast COMPARISON:  CT guided fiducial marker placement, 11/18/2019, MR abdomen, 11/05/2019, CT chest abdomen pelvis, 10/21/2019 FINDINGS: CT CHEST FINDINGS Cardiovascular: Left chest port catheter. Unchanged enlargement of the tubular ascending thoracic  aorta, measuring 4.4 x 4.3 cm. Normal heart size. No pericardial effusion. Mediastinum/Nodes: No enlarged mediastinal, hilar, or axillary lymph nodes. Status post left thyroidectomy. Trachea, and esophagus demonstrate no significant findings. Lungs/Pleura: Interval enlargement of a nodule of the medial right pulmonary apex, measuring 0.7 x 0.5 cm, previously 0.5 x 0.4 cm when measured similarly (series 13, image 33). Interval decrease in size of an irregular nodule of the dependent left lung base, measuring 0.8 x 0.6 cm, previously 1.2 x 0.8 cm when measured similarly (series 13, image 147). No pleural effusion or pneumothorax. Musculoskeletal: No chest wall mass or suspicious bone lesions identified. CT ABDOMEN PELVIS FINDINGS Hepatobiliary: Redemonstrated nonenhancing ablation sites of the inferior right lobe of the liver, hepatic segment VI measuring 3.6 x 2.0 cm (series 11, image 122) and posterior liver dome, hepatic segment VII, measuring 2.8 x 2.4 cm (series 11, image 102). Evaluation of a new lesion of the inferior left lobe of the liver, previously better characterized by MR,  is limited by the placement of adjacent metallic fiducial marking clips, however this lesion does appear to have somewhat diminished and decreased in density compared to prior examinations (series 11, image 111). Status post cholecystectomy. Postoperative biliary dilatation. Pancreas: Unremarkable. No pancreatic ductal dilatation or surrounding inflammatory changes. Spleen: Unchanged mild splenomegaly, maximum coronal span 13.7 cm. Unchanged low-attenuation, nonenhancing lesion anteriorly (series 11, image 109). Adrenals/Urinary Tract: Adrenal glands are unremarkable. Kidneys are normal, without renal calculi, solid lesion, or hydronephrosis. Bladder is unremarkable. Stomach/Bowel: Stomach is within normal limits. Status post low anterior resection and reanastomosis. Unchanged perirectal and presacral soft tissue thickening and  stranding (series 11, image 202). Vascular/Lymphatic: Aortic atherosclerosis. No enlarged abdominal or pelvic lymph nodes. Reproductive: No mass or other abnormality. Other: No abdominal wall hernia or abnormality. No abdominopelvic ascites. Musculoskeletal: No acute or significant osseous findings. IMPRESSION: 1. Evaluation of a new lesion of the inferior left lobe of the liver, previously better characterized by MR, is limited by the placement of adjacent metallic fiducial marking clips, however this lesion does appear to have somewhat diminished and decreased in density compared to prior examinations, consistent with treatment response to radiation therapy. 2. Redemonstrated nonenhancing low-attenuation ablation sites of the inferior right lobe of the liver, hepatic segment VI and posterior liver dome, hepatic segment VII. No evidence of local recurrence at these sites 3. Interval enlargement of a nodule of the medial right pulmonary apex. Interval decrease in size of an irregular nodule of the dependent left lung base. Findings are most consistent with mixed response to treatment of pulmonary metastatic disease. 4. Redemonstrated postoperative and post treatment findings in the low pelvis status post low anterior resection and reanastomosis with unchanged perirectal and presacral soft tissue thickening and fat stranding. 5. 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 Recommend annual imaging followup by CTA or MRA or attention on follow-up. This recommendation follows 2010 ACCF/AHA/AATS/ACR/ASA/SCA/SCAI/SIR/STS/SVM Guidelines for the Diagnosis and Management of Patients with Thoracic Aortic Disease. Circulation. 2010; 121: Q683-M196. Aortic aneurysm NOS (ICD10-I71.9) Aortic Atherosclerosis (ICD10-I70.0). Electronically Signed   By: Eddie Candle M.D.   On: 01/22/2020 14:04   CT Chest W Contrast  Result Date: 01/22/2020 CLINICAL DATA:  Metastatic colorectal  cancer staging, status post colon resection, chemotherapy, radiotherapy, history of thyroid cancer status post lobectomy EXAM: CT CHEST WITH CONTRAST CT ABDOMEN AND PELVIS WITH AND WITHOUT CONTRAST TECHNIQUE: Multidetector CT imaging of the chest was performed during intravenous contrast administration. Multidetector CT imaging of the abdomen and pelvis was performed following the standard protocol before and during bolus administration of intravenous contrast. CONTRAST:  67mL OMNIPAQUE IOHEXOL 300 MG/ML SOLN, additional oral enteric contrast COMPARISON:  CT guided fiducial marker placement, 11/18/2019, MR abdomen, 11/05/2019, CT chest abdomen pelvis, 10/21/2019 FINDINGS: CT CHEST FINDINGS Cardiovascular: Left chest port catheter. Unchanged enlargement of the tubular ascending thoracic aorta, measuring 4.4 x 4.3 cm. Normal heart size. No pericardial effusion. Mediastinum/Nodes: No enlarged mediastinal, hilar, or axillary lymph nodes. Status post left thyroidectomy. Trachea, and esophagus demonstrate no significant findings. Lungs/Pleura: Interval enlargement of a nodule of the medial right pulmonary apex, measuring 0.7 x 0.5 cm, previously 0.5 x 0.4 cm when measured similarly (series 13, image 33). Interval decrease in size of an irregular nodule of the dependent left lung base, measuring 0.8 x 0.6 cm, previously 1.2 x 0.8 cm when measured similarly (series 13, image 147). No pleural effusion or pneumothorax. Musculoskeletal: No chest wall mass or suspicious bone lesions identified. CT ABDOMEN  PELVIS FINDINGS Hepatobiliary: Redemonstrated nonenhancing ablation sites of the inferior right lobe of the liver, hepatic segment VI measuring 3.6 x 2.0 cm (series 11, image 122) and posterior liver dome, hepatic segment VII, measuring 2.8 x 2.4 cm (series 11, image 102). Evaluation of a new lesion of the inferior left lobe of the liver, previously better characterized by MR, is limited by the placement of adjacent metallic  fiducial marking clips, however this lesion does appear to have somewhat diminished and decreased in density compared to prior examinations (series 11, image 111). Status post cholecystectomy. Postoperative biliary dilatation. Pancreas: Unremarkable. No pancreatic ductal dilatation or surrounding inflammatory changes. Spleen: Unchanged mild splenomegaly, maximum coronal span 13.7 cm. Unchanged low-attenuation, nonenhancing lesion anteriorly (series 11, image 109). Adrenals/Urinary Tract: Adrenal glands are unremarkable. Kidneys are normal, without renal calculi, solid lesion, or hydronephrosis. Bladder is unremarkable. Stomach/Bowel: Stomach is within normal limits. Status post low anterior resection and reanastomosis. Unchanged perirectal and presacral soft tissue thickening and stranding (series 11, image 202). Vascular/Lymphatic: Aortic atherosclerosis. No enlarged abdominal or pelvic lymph nodes. Reproductive: No mass or other abnormality. Other: No abdominal wall hernia or abnormality. No abdominopelvic ascites. Musculoskeletal: No acute or significant osseous findings. IMPRESSION: 1. Evaluation of a new lesion of the inferior left lobe of the liver, previously better characterized by MR, is limited by the placement of adjacent metallic fiducial marking clips, however this lesion does appear to have somewhat diminished and decreased in density compared to prior examinations, consistent with treatment response to radiation therapy. 2. Redemonstrated nonenhancing low-attenuation ablation sites of the inferior right lobe of the liver, hepatic segment VI and posterior liver dome, hepatic segment VII. No evidence of local recurrence at these sites 3. Interval enlargement of a nodule of the medial right pulmonary apex. Interval decrease in size of an irregular nodule of the dependent left lung base. Findings are most consistent with mixed response to treatment of pulmonary metastatic disease. 4. Redemonstrated  postoperative and post treatment findings in the low pelvis status post low anterior resection and reanastomosis with unchanged perirectal and presacral soft tissue thickening and fat stranding. 5. 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 Recommend annual imaging followup by CTA or MRA or attention on follow-up. This recommendation follows 2010 ACCF/AHA/AATS/ACR/ASA/SCA/SCAI/SIR/STS/SVM Guidelines for the Diagnosis and Management of Patients with Thoracic Aortic Disease. Circulation. 2010; 121: E454-U981. Aortic aneurysm NOS (ICD10-I71.9) Aortic Atherosclerosis (ICD10-I70.0). Electronically Signed   By: Eddie Candle M.D.   On: 01/22/2020 14:04     ASSESSMENT:  1. 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 Liver biopsy on 03/09/2018 consistent with adenocarcinoma. 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 -7 cycles of FOLFOX with vectibix completed on 06/27/2018. -XRT with Xeloda from 07/30/2018 through 09/06/2018. -Right liver lesion microwave ablation on 10/15/2018 at [facility]. -Low anterior resection and diverting ileostomy on 12/07/2018, pathology [id number], 0/6 lymph nodes positive, margins negative. -Loop ileostomy reversal on 04/17/2019. -CTAP on 07/10/2019 showed left lower lobe pulmonary nodule increased by 1 mm, measuring 8 mm. 3 mm left upper lobe lung nodule is unchanged. Probable 4 mm right apical pulmonary nodule felt to be enlarged from 2 mm from October 2020. Vague hypoattenuation within the posterior right hepatic lobe measures 1.5 cm, measuring 1.2 cm in October 2020. Subtle subcapsular right hepatic lobe hypoattenuating 1 cm lesion was not seen on prior scans. -We reviewed results of MRI of the abdomen with and without contrast from 07/16/2019.  2 enhancing lesions in the right hepatic lobe, largest one measuring 1.5 x 1.0 cm. Inferior lesion is measured  as 1 cm. Stable splenomegaly. -CEA is 2.4. -Microwave ablation of the right hepatic lobe lesion by Dr. Maudie Mercury around 08/15/2019. -SBRT to the left lower lobe lung lesion and left liver lobe lesion on 12/13/2019 at [facility]. -CT CAP from 01/22/2020 showed interval enlargement of medial right pulmonary nodule measuring about 7 mm, previously 5 mm.  Evaluation of the new lesion in the left lobe of the liver is limited by metallic fiducial marking clips although lesion appears to be diminished.  Low attenuation ablation sites in the right lobe of the liver unchanged.  Left lower lobe lung lesion also decreased in size.  2. Hyperbilirubinemia: -Total bilirubin is 5.1. She has Gilbert's syndrome. Baseline is between 2 and 3.  3. Hypothyroidism: -She is on Armour Thyroid. TSH is 3.6.  4. Weight loss: -She had 10 pound weight loss since her last visit 3 months ago. However she had ileostomy reversal and is adjusting to new food habits.   PLAN:  1.  Stage IV rectal adenocarcinoma: -She has completed XRT to the left lower lobe lung lesion and left lobe liver lesion on 12/13/2019. -We reviewed results of the CT CAP from 01/22/2020.  Left lower lobe lung lesion and left lobe of the liver lesion decreased.  However some slight increase in the right lung lesion by few millimeters. -Because of her weakness, I have recommended close follow-up with CT CAP in 3 months.  If it continues to grow, we will consider SBRT to that lesion. -We also reviewed labs which showed normal CEA. -RTC 3 months with labs and scan.  2.  Hyperbilirubinemia: -She has Gilbert's syndrome.  Today her bilirubin is 3.3.  3.  Weight loss: -She lost 10 pounds in the last 3 months.  She reports that appetite is lost and she is having epigastric abdominal pain since radiation.  Overall she is feeling better this week.  Recommend taking some Maalox for the epigastric pain.  Recommend drinking clear nutrition supplements.  4.   Hypothyroidism: -Continue Armour Thyroid.  5.  Leukopenia and thrombocytopenia: -White count is 2.5 with normal ANC.  Platelet count is 75. -This is likely from mild splenomegaly measuring 13.7 cm.  No intervention necessary.   Orders placed this encounter:  No orders of the defined types were placed in this encounter.    Derek Jack, MD [place] [phone]   I, Milinda Antis, am acting as a scribe for Dr. Sanda Linger.  I, Derek Jack MD, have reviewed the above documentation for accuracy and completeness, and I agree with the above.

## 2020-01-23 NOTE — Patient Instructions (Signed)
[place] at [facility] Discharge Instructions  You were seen today by Dr. Delton Coombes. He went over your recent results and scans. Purchase Pepto Bismol or Maalox and see if it helps your upset stomach. You will be scheduled for a CT scan of your chest and abdomen before your next visit. Dr. Delton Coombes will see you back in 3 months for labs and follow up.   Thank you for choosing [place] at [facility] to provide your oncology and hematology care.  To afford each patient quality time with our provider, please arrive at least 15 minutes before your scheduled appointment time.   If you have a lab appointment with the [facility] please come in thru the Main Entrance and check in at the main information desk  You need to re-schedule your appointment should you arrive 10 or more minutes late.  We strive to give you quality time with our providers, and arriving late affects you and other patients whose appointments are after yours.  Also, if you no show three or more times for appointments you may be dismissed from the clinic at the providers discretion.     Again, thank you for choosing [facility].  Our hope is that these requests will decrease the amount of time that you wait before being seen by our physicians.       _____________________________________________________________  Should you have questions after your visit to [facility], please contact our office at [phone] between the hours of 8:00 a.m. and 4:30 p.m.  Voicemails left after 4:00 p.m. will not be returned until the following business day.  For prescription refill requests, have your pharmacy contact our office and allow 72 hours.    Cancer Center Support Programs:   > Cancer Support Group  2nd Tuesday of the month 1pm-2pm, Journey Room

## 2020-01-24 ENCOUNTER — Other Ambulatory Visit (HOSPITAL_COMMUNITY): Payer: Self-pay | Admitting: *Deleted

## 2020-01-24 DIAGNOSIS — C20 Malignant neoplasm of rectum: Secondary | ICD-10-CM

## 2020-01-27 ENCOUNTER — Encounter: Payer: Self-pay | Admitting: Family Medicine

## 2020-01-27 ENCOUNTER — Other Ambulatory Visit: Payer: Self-pay | Admitting: Family Medicine

## 2020-01-27 MED ORDER — ALPRAZOLAM 0.5 MG PO TABS
ORAL_TABLET | ORAL | 2 refills | Status: DC
Start: 2020-01-27 — End: 2020-06-04

## 2020-01-27 MED FILL — ALPRAZolam 0.5 MG TABS: 0.5 | 30 days supply | Qty: 90 | Fill #0

## 2020-01-27 NOTE — Addendum Note (Signed)
Addended by: Sallee Lange A on: 01/27/2020 11:34 AM   Modules accepted: Orders

## 2020-01-27 NOTE — Telephone Encounter (Signed)
Last seen 07/12/19

## 2020-01-27 NOTE — Telephone Encounter (Signed)
Med pended and ready to sign 

## 2020-01-27 NOTE — Addendum Note (Signed)
Addended by: Carmelina Noun on: 01/27/2020 09:55 AM   Modules accepted: Orders

## 2020-01-27 NOTE — Telephone Encounter (Signed)
Nurses Please set up the refills for 3 refills, please pend and send to me so I can sign off on it thank you

## 2020-02-07 MED FILL — DICYCLOMINE 10 MG CAPSULE: 10 | 30 days supply | Qty: 90 | Fill #4

## 2020-02-18 DIAGNOSIS — C73 Malignant neoplasm of thyroid gland: Secondary | ICD-10-CM | POA: Diagnosis not present

## 2020-02-18 DIAGNOSIS — C20 Malignant neoplasm of rectum: Secondary | ICD-10-CM | POA: Diagnosis not present

## 2020-02-18 DIAGNOSIS — E063 Autoimmune thyroiditis: Secondary | ICD-10-CM | POA: Diagnosis not present

## 2020-02-18 DIAGNOSIS — E860 Dehydration: Secondary | ICD-10-CM | POA: Diagnosis not present

## 2020-02-18 DIAGNOSIS — E890 Postprocedural hypothyroidism: Secondary | ICD-10-CM | POA: Diagnosis not present

## 2020-03-03 ENCOUNTER — Other Ambulatory Visit (HOSPITAL_COMMUNITY): Payer: Self-pay | Admitting: Endocrinology

## 2020-03-03 DIAGNOSIS — C73 Malignant neoplasm of thyroid gland: Secondary | ICD-10-CM | POA: Diagnosis not present

## 2020-03-03 DIAGNOSIS — C20 Malignant neoplasm of rectum: Secondary | ICD-10-CM | POA: Diagnosis not present

## 2020-03-03 DIAGNOSIS — E890 Postprocedural hypothyroidism: Secondary | ICD-10-CM | POA: Diagnosis not present

## 2020-03-03 DIAGNOSIS — E860 Dehydration: Secondary | ICD-10-CM | POA: Diagnosis not present

## 2020-03-03 DIAGNOSIS — D511 Vitamin B12 deficiency anemia due to selective vitamin B12 malabsorption with proteinuria: Secondary | ICD-10-CM | POA: Diagnosis not present

## 2020-03-03 DIAGNOSIS — E063 Autoimmune thyroiditis: Secondary | ICD-10-CM | POA: Diagnosis not present

## 2020-03-06 ENCOUNTER — Encounter (HOSPITAL_COMMUNITY): Payer: Self-pay

## 2020-03-06 ENCOUNTER — Other Ambulatory Visit: Payer: Self-pay

## 2020-03-06 ENCOUNTER — Other Ambulatory Visit (HOSPITAL_COMMUNITY): Payer: Self-pay

## 2020-03-06 ENCOUNTER — Encounter (HOSPITAL_COMMUNITY)
Admission: RE | Admit: 2020-03-06 | Discharge: 2020-03-06 | Disposition: A | Discharge status: Home / Self Care | Payer: 59 | Source: Ambulatory Visit | Attending: Hematology | Admitting: Hematology

## 2020-03-06 DIAGNOSIS — C19 Malignant neoplasm of rectosigmoid junction: Secondary | ICD-10-CM

## 2020-03-06 DIAGNOSIS — C20 Malignant neoplasm of rectum: Secondary | ICD-10-CM | POA: Insufficient documentation

## 2020-03-06 DIAGNOSIS — C787 Secondary malignant neoplasm of liver and intrahepatic bile duct: Secondary | ICD-10-CM | POA: Diagnosis not present

## 2020-03-06 LAB — CBC WITH DIFFERENTIAL/PLATELET
Abs Immature Granulocytes: 0.01 10*3/uL (ref 0.00–0.07)
Basophils Absolute: 0 10*3/uL (ref 0.0–0.1)
Basophils Relative: 0 %
Eosinophils Absolute: 0 10*3/uL (ref 0.0–0.5)
Eosinophils Relative: 0 %
HCT: 32.1 % — ABNORMAL LOW (ref 36.0–46.0)
Hemoglobin: 11.3 g/dL — ABNORMAL LOW (ref 12.0–15.0)
Immature Granulocytes: 0 %
Lymphocytes Relative: 8 %
Lymphs Abs: 0.3 10*3/uL — ABNORMAL LOW (ref 0.7–4.0)
MCH: 35.1 pg — ABNORMAL HIGH (ref 26.0–34.0)
MCHC: 35.2 g/dL (ref 30.0–36.0)
MCV: 99.7 fL (ref 80.0–100.0)
Monocytes Absolute: 0.2 10*3/uL (ref 0.1–1.0)
Monocytes Relative: 6 %
Neutro Abs: 3.2 10*3/uL (ref 1.7–7.7)
Neutrophils Relative %: 86 %
Platelets: 98 10*3/uL — ABNORMAL LOW (ref 150–400)
RBC: 3.22 MIL/uL — ABNORMAL LOW (ref 3.87–5.11)
RDW: 13.2 % (ref 11.5–15.5)
WBC: 3.7 10*3/uL — ABNORMAL LOW (ref 4.0–10.5)
nRBC: 0 % (ref 0.0–0.2)

## 2020-03-06 LAB — COMPREHENSIVE METABOLIC PANEL
ALT: 15 U/L (ref 0–44)
AST: 26 U/L (ref 15–41)
Albumin: 4.3 g/dL (ref 3.5–5.0)
Alkaline Phosphatase: 89 U/L (ref 38–126)
Anion gap: 8 (ref 5–15)
BUN: 19 mg/dL (ref 6–20)
CO2: 25 mmol/L (ref 22–32)
Calcium: 8.9 mg/dL (ref 8.9–10.3)
Chloride: 102 mmol/L (ref 98–111)
Creatinine, Ser: 0.55 mg/dL (ref 0.44–1.00)
GFR, Estimated: >60 mL/min (ref >60)
Glucose, Bld: 108 mg/dL — ABNORMAL HIGH (ref 70–99)
Potassium: 3.4 mmol/L — ABNORMAL LOW (ref 3.5–5.1)
Sodium: 135 mmol/L (ref 135–145)
Total Bilirubin: 2.8 mg/dL — ABNORMAL HIGH (ref 0.3–1.2)
Total Protein: 6.8 g/dL (ref 6.5–8.1)

## 2020-03-06 LAB — MAGNESIUM: Magnesium: 2.4 mg/dL (ref 1.7–2.4)

## 2020-03-06 MED ORDER — HEPARIN SOD (PORK) LOCK FLUSH 100 UNIT/ML IV SOLN
500.0000 [IU] | Freq: Once | INTRAVENOUS | Status: AC
  Administered 2020-03-06: 500 [IU] via INTRAVENOUS

## 2020-03-06 MED ORDER — SODIUM CHLORIDE 0.9 % IV SOLN: Freq: Once | INTRAVENOUS | Status: AC

## 2020-03-08 NOTE — Progress Notes (Signed)
[facility] [address] [place] [address]   CLINIC:  Medical Oncology/Hematology  PCP:  [name], MD [place] / [place] [address] [phone]   REASON FOR VISIT:   Follow-up for metastatic rectal cancer to liver  PRIOR THERAPY:  1. FOLFOX & vectibix x 7 cycles from 03/14/2018 to 06/27/2018. 2. XRT with Xeloda from 07/30/2018 to 09/06/2018. 3. Right liver lesion microwave ablation on 10/15/2018. 4. Low anterior resection and diverting ileostomy on 12/07/2018. 5. SBRT to liver 60 Gy in 5 fractions from 11/15/2019 to 12/13/2019.  NGS Results: Foundation 1 K-RAS/NRAS wild-type, MS--stable, TMB--low  CURRENT THERAPY: Observation  BRIEF ONCOLOGIC HISTORY:  Oncology History  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[place])  02/26/2018 Initial Diagnosis   Rectal cancer ([place])   03/13/2018 Cancer Staging   Staging form: Colon and Rectum, AJCC 8th Edition - Clinical stage from 03/13/2018: Stage IVA (cT3, cN1b, cM1a) - Signed by Derek Jack, MD on 03/13/2018   03/14/2018 - 06/29/2018 Chemotherapy   The patient had palonosetron (ALOXI) injection 0.25 mg, 0.25 mg, Intravenous,  Once, 7 of 8 cycles Administration: 0.25 mg (03/14/2018), 0.25 mg (03/27/2018), 0.25 mg (04/18/2018), 0.25 mg (05/02/2018), 0.25 mg (05/16/2018), 0.25 mg (06/05/2018), 0.25 mg (06/27/2018) leucovorin 800 mg in dextrose 5 % 250 mL infusion, 772 mg, Intravenous,  Once, 7 of 8 cycles Administration: 800 mg (03/14/2018), 800 mg (03/27/2018), 800 mg (05/02/2018), 800 mg (05/16/2018), 700 mg (04/18/2018), 800 mg (06/05/2018), 800 mg (06/27/2018) oxaliplatin (ELOXATIN) 165 mg in dextrose 5 % 500 mL chemo infusion, 85 mg/m2 = 165 mg, Intravenous,  Once, 6 of 7 cycles Dose modification: 68 mg/m2 (80 % of original dose 85 mg/m2, Cycle 4, Reason: Provider Judgment) Administration: 165 mg (03/14/2018), 165 mg (03/27/2018), 130 mg (05/02/2018), 130 mg (05/16/2018), 130 mg (06/05/2018), 130 mg  (06/27/2018) panitumumab (VECTIBIX) 500 mg in sodium chloride 0.9 % 100 mL chemo infusion, 480 mg, Intravenous,  Once, 4 of 5 cycles Administration: 500 mg (04/18/2018), 480 mg (05/02/2018), 480 mg (05/16/2018), 480 mg (06/05/2018) fluorouracil (ADRUCIL) chemo injection 750 mg, 400 mg/m2 = 750 mg (100 % of original dose 400 mg/m2), Intravenous,  Once, 6 of 7 cycles Dose modification: 400 mg/m2 (original dose 400 mg/m2, Cycle 1), 400 mg/m2 (original dose 400 mg/m2, Cycle 3) Administration: 750 mg (03/14/2018), 750 mg (04/18/2018), 750 mg (05/02/2018), 750 mg (05/16/2018), 750 mg (06/05/2018), 750 mg (06/27/2018) fosaprepitant (EMEND) 150 mg, dexamethasone (DECADRON) 12 mg in sodium chloride 0.9 % 145 mL IVPB, , Intravenous,  Once, 4 of 5 cycles Administration:  (05/02/2018),  (05/16/2018),  (06/05/2018),  (06/27/2018) fluorouracil (ADRUCIL) 4,650 mg in sodium chloride 0.9 % 57 mL chemo infusion, 2,400 mg/m2 = 4,650 mg, Intravenous, 1 Day/Dose, 7 of 8 cycles Administration: 4,650 mg (03/14/2018), 4,650 mg (03/27/2018), 4,650 mg (04/18/2018), 4,650 mg (05/02/2018), 4,650 mg (05/16/2018), 4,650 mg (06/05/2018), 4,650 mg (06/27/2018)  for chemotherapy treatment.      CANCER STAGING: Cancer Staging Malignant neoplasm of rectum [facility]) Staging form: Colon and Rectum, AJCC 8th Edition - Clinical stage from 03/13/2018: Stage IVA (cT3, cN1b, cM1a) - Signed by Derek Jack, MD on 03/13/2018   INTERVAL HISTORY:  Ms. LEIGHANNE ADOLPH, a 54 y.o. female, returns for routine follow-up of her metastatic rectal cancer. Laquisha was last seen on 10/22/2019.    She is here for FU with her husband. She is here to discuss ongoing weight loss. She says she cant sustain the ongoing weight loss. Mostly she doesn't want to  eat after a couple bites, some times the nausea doesn't help her with the food intake. She also has an abdominal pain which she points more to the epigastrium, left upper quadrant, which happens after eating. She  doesn't want to eat anything at times because she is scared of the diarrhea with her surgery. Rest of the ROS as mentioned below.  REVIEW OF SYSTEMS:  Review of Systems  Constitutional: Positive for appetite change and fatigue (60%).  Respiratory: Negative for cough.   Gastrointestinal: Positive for abdominal pain (3/10 epigastric & LUQ abdominal pain) and nausea (controlled w/ Compazine).  Neurological: Positive for numbness (& tingling in L foot).  Psychiatric/Behavioral: Positive for depression.  All other systems reviewed and are negative.   PAST MEDICAL/SURGICAL HISTORY:  Past Medical History:  Diagnosis Date  . Anxiety   . Cancer [facility]) 2013   thyroid  . Endometrial polyp   . Endometrial thickening on ultra sound   . GERD (gastroesophageal reflux disease)   . 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   Worse in her 20s when she was ill  . H/O Hashimoto thyroiditis   . History of chemotherapy   . History of radiation therapy   . 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 Hyperlipidemia 11/09/2015  . Hypothyroidism   . 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[place]) 02/26/2018   Past Surgical History:  Procedure Laterality Date  . BIOPSY  02/19/2018   Procedure: BIOPSY;  Surgeon: Rogene Houston, MD;  [facility];  Service: Endoscopy;;  rectum  . COLONOSCOPY N/A 08/20/2015   Procedure: COLONOSCOPY;  Surgeon: Rogene Houston, MD;  [facility];  Service: Endoscopy;  Laterality: N/A;  730  . [facility] ENDOMETRIAL ABLATION  08-13-2004  . DIVERTING ILEOSTOMY N/A 12/07/2018   Procedure: DIVERTING LOOP ILEOSTOMY;  Surgeon: Leighton Ruff, MD;  Location: [facility];  Service: General;  Laterality: N/A;  . FLEXIBLE SIGMOIDOSCOPY N/A 02/19/2018   Procedure: FLEXIBLE SIGMOIDOSCOPY;  Surgeon: Rogene Houston, MD;  Location[facility];  Service: Endoscopy;   Laterality: N/A;  . HYSTEROSCOPY WITH D & C N/A 04/30/2015   Procedure: DILATATION AND CURETTAGE / INTENDED HYSTEROSCOPY;  Surgeon: Dian Queen, MD;  Location: [facility];  Service: Gynecology;  Laterality: N/A;  . ILEOSTOMY CLOSURE N/A 04/17/2019   Procedure: LOOP ILEOSTOMY REVERSAL;  Surgeon: Leighton Ruff, MD;  Location[facility];  Service: General;  Laterality: N/A;  . IR US GUIDE BX ASP/DRAIN  03/09/2018  . LAPAROSCOPIC CHOLECYSTECTOMY  04/1996  . LAPAROSCOPY N/A 12/11/2018   Procedure: LAPAROSCOPY  ILEOSTOMY REVISION AND ABDOMINAL WASHOUT;  Surgeon: Leighton Ruff, MD;  Location: [facility];  Service: General;  Laterality: N/A;  . Liver Microwave   10/17/2018  . POLYPECTOMY  08/20/2015   Procedure: POLYPECTOMY;  Surgeon: Rogene Houston, MD;  [facility];  Service: Endoscopy;;  Splenic flexure polypectomy  . POLYPECTOMY  02/19/2018   Procedure: POLYPECTOMY;  Surgeon: Rogene Houston, MD;  [facility];  Service: Endoscopy;;  rectum  . PORTACATH PLACEMENT N/A 03/14/2018   Procedure: INSERTION PORT-A-CATH;  Surgeon: Aviva Signs, MD;  Location: [facility];  Service: General;  Laterality: N/A;  . REDUCTION INCARCERATED UTERUS  06-20-2000   intrauterine preg. 13 wks /  urinary retention  . THYROID LOBECTOMY  11/24/2011   Procedure: THYROID LOBECTOMY;  Surgeon: Earnstine Regal, MD;  [facility];  Service: General;  Laterality: Left;  Left Thyroid Lobectomy  . TUBAL LIGATION  2002  . XI ROBOTIC ASSISTED LOWER ANTERIOR RESECTION N/A 12/07/2018   Procedure: XI ROBOTIC ASSISTED LOWER ANTERIOR RESECTION, SPENIC FLEXURE IMMOBILIZATION, COLOANAL ANASTOMOSIS, RIGID PROCTOSCOPY;  Surgeon: Leighton Ruff, MD;  [facility];  Service: General;  Laterality: N/A;    SOCIAL HISTORY:  Social History   Socioeconomic History  . Marital status: Married    Spouse name: Not on file  . Number of children: 4  . Years of education: Not on file  . 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 Smoking status: Former Smoker    Packs/day: 0.25    Years: 10.00    Pack years: 2.50    Types: Cigarettes    Quit date: 10/31/1991    Years since quitting: 28.3  . Smokeless tobacco: Never Used  Vaping Use  . Vaping Use: Never used  Substance and Sexual Activity  . Alcohol use: No  . Drug use: No  . Sexual activity: Not Currently  Other Topics Concern  . Not on file  Social History Narrative   Lives with husband [name] and 30 year old son [name]   Husband is Interior and spatial designer of [employer]   Social Determinants of Health   Financial Resource Strain: Not on [employer] Insecurity: Not on file  Transportation Needs: Not on file  Physical Activity: Not on file  Stress: Not on file  Social Connections: Not on file  Intimate Partner Violence: Not on file    FAMILY HISTORY:  Family History  Problem Relation Age of Onset  . Hypertension Mother   . Hypertension Father   . Prostate cancer Father   . Cancer Maternal Aunt        spine/back  . Cancer Paternal Grandfather        lung  . Healthy Son   . Healthy Son   . Healthy Son   . Healthy Son   . Colon cancer Neg Hx     CURRENT MEDICATIONS:  Current Outpatient Medications  Medication Sig Dispense Refill  . ALPRAZolam (XANAX) 0.5 MG tablet Take one tablet tid 90 tablet 2  . amitriptyline (ELAVIL) 10 MG tablet Take 1 tablet (10 mg total) by mouth in the morning and at bedtime. 60 tablet 2  . ARMOUR THYROID 60 MG tablet Take 60 mg by mouth every morning.    . dicyclomine (BENTYL) 10 MG capsule Take 1 capsule (10 mg total) by mouth 3 (three) times daily before meals. 90 capsule 5  . fluticasone (CUTIVATE) 0.05 % cream Apply 1 application topically daily as needed (irritatiton).    Marland Kitchen lidocaine-prilocaine (EMLA) cream Apply small amount to port site one hour prior to appointment and cover with plastic wrap. (Patient not taking: Reported on  01/23/2020) 30 g 3  . loperamide (IMODIUM) 2 MG capsule Take 1 capsule (2 mg total) by mouth 3 (three) times daily. 30 capsule 0  . Multiple Vitamin (MULTI-VITAMIN) tablet Take by mouth.    . ondansetron (ZOFRAN) 4 MG tablet Take 1 tablet (4 mg total) by mouth every 8 (eight) hours as needed for nausea or vomiting. 30 tablet 2  . OVER THE COUNTER MEDICATION Multivitamin w/iron    . prochlorperazine (COMPAZINE) 10 MG tablet Take 1 tablet (10 mg total) by mouth every 6 (six) hours as needed for nausea  or vomiting. 30 tablet 2   No current facility-administered medications for this visit.   Facility-Administered Medications Ordered in Other Visits  Medication Dose Route Frequency Provider Last Rate Last Admin  . clindamycin (CLEOCIN) 900 mg in dextrose 5 % 50 mL IVPB  900 mg Intravenous 60 min Pre-Op Leighton Ruff, MD       And  . gentamicin (GARAMYCIN) 350 mg in dextrose 5 % 50 mL IVPB  5 mg/kg Intravenous 60 min Pre-Op Leighton Ruff, MD      . clindamycin (CLEOCIN) 900 mg in dextrose 5 % 50 mL IVPB  900 mg Intravenous 60 min Pre-Op Leighton Ruff, MD       And  . gentamicin (GARAMYCIN) 5 mg/kg in dextrose 5 % 50 mL IVPB  5 mg/kg Intravenous 60 min Pre-Op Leighton Ruff, MD      . sodium chloride 0.9 % 1,000 mL with potassium chloride 20 mEq, magnesium sulfate 2 g infusion   Intravenous Once Derek Jack, MD        ALLERGIES:  Allergies  Allergen Reactions  . Demerol [Meperidine] Itching    All over the body.  . Penicillins Hives     childhood, does not remember if it spread all over the body or not. Did it involve swelling of the face/tongue/throat, SOB, or low BP? No Did it involve sudden or severe rash/hives, skin peeling, or any reaction on the inside of your mouth or nose? Yes Did you need to seek medical attention at a hospital or doctor's office? Yes When did it last happen?childhood allergy If all above answers are "NO", may proceed with cephalosporin use.   .  Other Hives and Other (See Comments)    Fresh coconut   . Vancomycin Rash    Will need Benadryl prior to administration per IV. "Red man syndrome"    PHYSICAL EXAM:  Performance status (ECOG): 0 - Asymptomatic  There were no vitals filed for this visit. Wt Readings from Last 3 Encounters:  03/06/20 104 lb (47.2 kg)  01/23/20 110 lb 9.6 oz (50.2 kg)  11/22/19 116 lb 3.2 oz (52.7 kg)   Physical Exam Vitals reviewed.  Constitutional:      Appearance: Normal appearance.     Comments: cachectic  Eyes:     General: No scleral icterus. Abdominal:     Palpations: Abdomen is soft. There is no splenomegaly or mass.     Tenderness: There is abdominal tenderness (mild) in the epigastric area and left upper quadrant.  Neurological:     General: No focal deficit present.     Mental Status: She is alert and oriented to person, place, and time.  Psychiatric:        Mood and Affect: Mood normal.        Behavior: Behavior normal.      LABORATORY DATA:  I have reviewed the labs as listed.  CBC Latest Ref Rng & Units 03/06/2020 01/20/2020 11/18/2019  WBC 4.0 - 10.5 K/uL 3.7(L) 2.5(L) 3.1(L)  Hemoglobin 12.0 - 15.0 g/dL 11.3(L) 10.6(L) 11.3(L)  Hematocrit 36.0 - 46.0 % 32.1(L) 30.6(L) 32.1(L)  Platelets 150 - 400 K/uL 98(L) 75(L) 82(L)   CMP Latest Ref Rng & Units 03/06/2020 01/20/2020 11/21/2019  Glucose 70 - 99 mg/dL 108(H) 86 80  BUN 6 - 20 mg/dL _0 Creatinine 0.44 - 1.00 mg/dL 0.55 0.71 0.77  Sodium 135 - 145 mmol/L 135 136 137  Potassium 3.5 - 5.1 mmol/L 3.4(L) 3.3(L) 3.2(L)  Chloride 98 -  111 mmol/L 102 102 103  CO2 22 - 32 mmol/L _0 Calcium 8.9 - 10.3 mg/dL 8.9 8.9 8.9  Total Protein 6.5 - 8.1 g/dL 6.8 6.7 -  Total Bilirubin 0.3 - 1.2 mg/dL 2.8(H) 3.3(H) -  Alkaline Phos 38 - 126 U/L 89 102 -  AST 15 - 41 U/L 26 29 -  ALT 0 - 44 U/L 15 19 -   Lab Results  Component Value Date   LDH 154 01/20/2020   Lab Results  Component Value Date   CEA1 0.5 01/20/2020    CEA1 2.1 10/18/2019   CEA1 2.4 07/15/2019    DIAGNOSTIC IMAGING:  I have independently reviewed the scans and discussed with the patient. No results found.   ASSESSMENT:  1. 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 Liver biopsy on 03/09/2018 consistent with adenocarcinoma. 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 -7 cycles of FOLFOX with vectibix completed on 06/27/2018. -XRT with Xeloda from 07/30/2018 through 09/06/2018. -Right liver lesion microwave [facility] on 10/15/2018 [facility]. -Low anterior resection and diverting ileostomy on 12/07/2018, pathology [id number], 0/6 lymph nodes positive, margins negative. -Loop ileostomy reversal on 04/17/2019. -CTAP on 07/10/2019 showed left lower lobe pulmonary nodule increased by 1 mm, measuring 8 mm. 3 mm left upper lobe lung nodule is unchanged. Probable 4 mm right apical pulmonary nodule felt to be enlarged from 2 mm from October 2020. Vague hypoattenuation within the posterior right hepatic lobe measures 1.5 cm, measuring 1.2 cm in October 2020. Subtle subcapsular right hepatic lobe hypoattenuating 1 cm lesion was not seen on prior scans. -We reviewed results of MRI of the abdomen with and without contrast from 07/16/2019. 2 enhancing lesions in the right hepatic lobe, largest one measuring 1.5 x 1.0 cm. Inferior lesion is measured as 1 cm. Stable splenomegaly. -CEA is 2.4. -Microwave ablation of the right hepatic lobe lesion by Dr. Maudie Mercury around 08/15/2019. -SBRT to the left lower lobe lung lesion and left liver lobe lesion on 12/13/2019 at [facility]. -CT CAP from 01/22/2020 showed interval enlargement of medial right pulmonary nodule measuring about 7 mm, previously 5 mm.  Evaluation of the new lesion in the left lobe of the liver is limited by metallic fiducial marking clips although lesion appears to be diminished.  Low attenuation ablation sites in the right  lobe of the liver unchanged.  Left lower lobe lung lesion also decreased in size.  2. Hyperbilirubinemia: -Total bilirubin is 5.1. She has Gilbert's syndrome. Baseline is between 2 and 3.  3. Hypothyroidism: -She is on Armour Thyroid. TSH is 3.6.  4. Weight loss: -She had 10 pound weight loss since her last visit 3 months ago. However she had ileostomy reversal and is adjusting to new food habits.   PLAN:   1.  Stage IV rectal adenocarcinoma:  -She has completed XRT to the left lower lobe lung lesion and left lobe liver lesion on 12/13/2019. CT CAP from 01/22/2020.  Left lower lobe lung lesion and left lobe of the liver lesion decreased.  However some slight increase in the right lung lesion by few millimeters. -Because of her weakness, Dr Raliegh Ip has recommended close follow-up with CT CAP in 3 months.  If it continues to grow, we will consider SBRT to that lesion. She is here for FU to discuss mostly weight loss. CEA not done, CT scans scheduled for Jan. She has FU scheduled with Dr Raliegh Ip to discuss imaging results at  that time.  2.  Hyperbilirubinemia: -She has Gilbert's syndrome.  Today her bilirubin is 3.3.  3.  Weight loss: -Recommended remeron 15 mg QHS for appetite stimulation. Recommend PRN compazine for nausea Recommend nutritionist referral Tramadol 50 mg Q 8 hr PRN for abdominal pain. FU in 3 weeks approximately to review weight issues.  4.  Hypothyroidism: -Continue Armour Thyroid.  5.  Leukopenia and thrombocytopenia: Stable, likely related to mild splenomegaly per Dr Raliegh Ip.   Orders placed this encounter:  No orders of the defined types were placed in this encounter.  Benay Pike MD

## 2020-03-09 ENCOUNTER — Other Ambulatory Visit: Payer: Self-pay

## 2020-03-09 ENCOUNTER — Other Ambulatory Visit (HOSPITAL_COMMUNITY): Payer: Self-pay | Admitting: *Deleted

## 2020-03-09 ENCOUNTER — Inpatient Hospital Stay (HOSPITAL_COMMUNITY): Payer: 59 | Attending: Hematology | Admitting: Hematology and Oncology

## 2020-03-09 ENCOUNTER — Encounter (HOSPITAL_COMMUNITY): Payer: Self-pay | Admitting: Hematology and Oncology

## 2020-03-09 ENCOUNTER — Other Ambulatory Visit (HOSPITAL_COMMUNITY): Payer: Self-pay | Admitting: Hematology and Oncology

## 2020-03-09 VITALS — BP 108/80 | HR 64 | Temp 97.9°F | Resp 17 | Wt 103.2 lb

## 2020-03-09 DIAGNOSIS — Z8249 Family history of ischemic heart disease and other diseases of the circulatory system: Secondary | ICD-10-CM | POA: Insufficient documentation

## 2020-03-09 DIAGNOSIS — C787 Secondary malignant neoplasm of liver and intrahepatic bile duct: Secondary | ICD-10-CM | POA: Diagnosis not present

## 2020-03-09 DIAGNOSIS — Z801 Family history of malignant neoplasm of trachea, bronchus and lung: Secondary | ICD-10-CM | POA: Insufficient documentation

## 2020-03-09 DIAGNOSIS — Z9221 Personal history of antineoplastic chemotherapy: Secondary | ICD-10-CM | POA: Insufficient documentation

## 2020-03-09 DIAGNOSIS — C20 Malignant neoplasm of rectum: Secondary | ICD-10-CM | POA: Diagnosis not present

## 2020-03-09 DIAGNOSIS — Z923 Personal history of irradiation: Secondary | ICD-10-CM | POA: Insufficient documentation

## 2020-03-09 DIAGNOSIS — D696 Thrombocytopenia, unspecified: Secondary | ICD-10-CM | POA: Insufficient documentation

## 2020-03-09 DIAGNOSIS — R200 Anesthesia of skin: Secondary | ICD-10-CM | POA: Diagnosis not present

## 2020-03-09 DIAGNOSIS — Z87891 Personal history of nicotine dependence: Secondary | ICD-10-CM | POA: Insufficient documentation

## 2020-03-09 DIAGNOSIS — Z85850 Personal history of malignant neoplasm of thyroid: Secondary | ICD-10-CM | POA: Diagnosis not present

## 2020-03-09 DIAGNOSIS — F32A Depression, unspecified: Secondary | ICD-10-CM | POA: Insufficient documentation

## 2020-03-09 DIAGNOSIS — Z79899 Other long term (current) drug therapy: Secondary | ICD-10-CM | POA: Diagnosis not present

## 2020-03-09 DIAGNOSIS — Z8042 Family history of malignant neoplasm of prostate: Secondary | ICD-10-CM | POA: Insufficient documentation

## 2020-03-09 DIAGNOSIS — D72819 Decreased white blood cell count, unspecified: Secondary | ICD-10-CM | POA: Insufficient documentation

## 2020-03-09 DIAGNOSIS — R1012 Left upper quadrant pain: Secondary | ICD-10-CM | POA: Insufficient documentation

## 2020-03-09 DIAGNOSIS — E039 Hypothyroidism, unspecified: Secondary | ICD-10-CM | POA: Diagnosis not present

## 2020-03-09 DIAGNOSIS — Z808 Family history of malignant neoplasm of other organs or systems: Secondary | ICD-10-CM | POA: Diagnosis not present

## 2020-03-09 MED ORDER — MIRTAZAPINE 15 MG PO TABS: 15.0000 mg | ORAL_TABLET | Freq: Every day | ORAL | 0 refills | Status: DC

## 2020-03-09 MED ORDER — TRAMADOL HCL 50 MG PO TABS: 50.0000 mg | ORAL_TABLET | Freq: Two times a day (BID) | ORAL | 0 refills | Status: DC | PRN

## 2020-03-09 MED FILL — traMADol HCL 50 MG TABS: 50 | 15 days supply | Qty: 30 | Fill #0

## 2020-03-09 MED FILL — DICYCLOMINE 10 MG CAPSULE: 10 | 30 days supply | Qty: 90 | Fill #5

## 2020-03-09 MED FILL — AMITRIPTYLINE HCL 10 MG TAB: 10 | 30 days supply | Qty: 60 | Fill #1

## 2020-03-09 MED FILL — MIRTAZAPINE 15 MG TABLET: 15 | 39 days supply | Qty: 39 | Fill #0

## 2020-03-09 MED FILL — ALPRAZolam 0.5 MG TABS: 0.5 | 30 days supply | Qty: 90 | Fill #1

## 2020-03-10 ENCOUNTER — Other Ambulatory Visit (HOSPITAL_COMMUNITY): Payer: Self-pay | Admitting: *Deleted

## 2020-03-10 MED ORDER — PROCHLORPERAZINE MALEATE 10 MG PO TABS: 10.0000 mg | ORAL_TABLET | Freq: Four times a day (QID) | ORAL | 3 refills | Status: DC | PRN

## 2020-03-10 MED FILL — PROCHLORPERAZINE 10 MG TAB: 10 | 7 days supply | Qty: 30 | Fill #1

## 2020-03-11 ENCOUNTER — Telehealth (INDEPENDENT_AMBULATORY_CARE_PROVIDER_SITE_OTHER): Payer: Self-pay | Admitting: Internal Medicine

## 2020-03-11 DIAGNOSIS — R634 Abnormal weight loss: Secondary | ICD-10-CM

## 2020-03-11 NOTE — Telephone Encounter (Signed)
Samantha Reynolds called to review her condition with me. Her weight is down 204 pounds. She was begun on mirtazapine by Dr. Benay Pike of oncology. Her LFTs were normal. Patient states she saw Dr. Ronnald Collum and her thyroid studies were also normal. She has no appetite. She has no obvious recurrence or metastatic disease. In this situation will also rule out Addison's disease.

## 2020-03-13 ENCOUNTER — Other Ambulatory Visit: Payer: Self-pay

## 2020-03-13 ENCOUNTER — Ambulatory Visit (HOSPITAL_COMMUNITY): Payer: 59

## 2020-03-13 ENCOUNTER — Encounter (HOSPITAL_COMMUNITY)
Admission: RE | Admit: 2020-03-13 | Discharge: 2020-03-13 | Disposition: A | Discharge status: Home / Self Care | Payer: 59 | Source: Ambulatory Visit | Attending: Internal Medicine | Admitting: Internal Medicine

## 2020-03-13 DIAGNOSIS — C787 Secondary malignant neoplasm of liver and intrahepatic bile duct: Secondary | ICD-10-CM | POA: Diagnosis not present

## 2020-03-13 DIAGNOSIS — C20 Malignant neoplasm of rectum: Secondary | ICD-10-CM | POA: Diagnosis not present

## 2020-03-13 LAB — CORTISOL-AM, BLOOD: Cortisol - AM: 14.9 ug/dL (ref 6.7–22.6)

## 2020-03-13 NOTE — Progress Notes (Signed)
Nutrition Follow-up:  Referral for weight loss.  Last seen by RD on 04/20/2018.  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  Patient not currently in treatment.  Spoke with patient via phone today.  Patient reports that she has no desire to eat.  "I can sit here all day and not eat."  Can't think of anything that she wants to eat.  Reports that eating and chewing is a chore.  Has issues with abdominal pain that she takes tramadol.  Recently started on remeron.  Limits diet due to past intolerance prior to cancer but also for food issues during treatment.  Avoids diary products, hot liquids.  Mainly eats green beans, cooked carrots, peanut butter jelly sandwich or [place] sandwich, pizza, spaghetti, macaroni and cheese, hamburger, chicken, bananas.  Drinks ensure clear shakes (180 calories). Has tried [employer] and does not like them.  Reports that she has been eating shortbread cookies for breakfast (4) 1/2 sandwich for lunch and dinner maybe a sandwich or baked potato or macaroni and cheese.  Tried since seeing MD on Monday lactose free ice cream and tolerated well.   Has issues with nausea especially at night. Takes compazine 1- 1/2 before dinner each day.   Medications: MVI, remeron   Labs: reviewed   Anthropometrics:   Height: 66 inches Weight: 103 lb on 12/13 135 lb Jan 2021 BMI: 16  24% weight loss in the last 11 months, signficant   Estimated Energy Needs  Kcals: 1400-1600 Protein: 70-82 g Fluid: 1.4 L   NUTRITION DIAGNOSIS: Inadequate oral intake related to cancer treatment side effects, altered GI function as evidenced by 24% weight loss in the last 11 months and poor po intake   INTERVENTION:  Continue appetite stimulant Set schedule to eat q 2 hours  Discussed lactose free options for patient to try (lactaid, soy and other plant based options).   Will provide samples of orgain shake and protein powder and ensure clear therapeutic version (240  calories, higher) Will provide smoothie recipes for patient to try as interested in liquid options.   Contact information provided   MONITORING, EVALUATION, GOAL: weight trends, intake   Next Visit: Jan 14 phone f/u  Samantha Reynolds, [place], [place] Registered Dietitian [phone] (mobile)

## 2020-03-27 MED FILL — ARMOUR THYROID 60 MG TABLET: 60 | 90 days supply | Qty: 90 | Fill #0

## 2020-03-30 ENCOUNTER — Other Ambulatory Visit (HOSPITAL_COMMUNITY): Payer: 59

## 2020-03-31 ENCOUNTER — Other Ambulatory Visit: Payer: Self-pay

## 2020-03-31 ENCOUNTER — Inpatient Hospital Stay (HOSPITAL_COMMUNITY): Payer: 59

## 2020-03-31 ENCOUNTER — Encounter (HOSPITAL_COMMUNITY)
Admission: RE | Admit: 2020-03-31 | Discharge: 2020-03-31 | Disposition: A | Discharge status: Home / Self Care | Payer: 59 | Source: Ambulatory Visit | Attending: Hematology | Admitting: Hematology

## 2020-03-31 DIAGNOSIS — Z87891 Personal history of nicotine dependence: Secondary | ICD-10-CM | POA: Insufficient documentation

## 2020-03-31 DIAGNOSIS — R634 Abnormal weight loss: Secondary | ICD-10-CM | POA: Insufficient documentation

## 2020-03-31 DIAGNOSIS — F32A Depression, unspecified: Secondary | ICD-10-CM | POA: Insufficient documentation

## 2020-03-31 DIAGNOSIS — Z880 Allergy status to penicillin: Secondary | ICD-10-CM | POA: Insufficient documentation

## 2020-03-31 DIAGNOSIS — D72819 Decreased white blood cell count, unspecified: Secondary | ICD-10-CM | POA: Diagnosis not present

## 2020-03-31 DIAGNOSIS — R5383 Other fatigue: Secondary | ICD-10-CM | POA: Insufficient documentation

## 2020-03-31 DIAGNOSIS — C20 Malignant neoplasm of rectum: Secondary | ICD-10-CM

## 2020-03-31 DIAGNOSIS — C787 Secondary malignant neoplasm of liver and intrahepatic bile duct: Secondary | ICD-10-CM | POA: Insufficient documentation

## 2020-03-31 DIAGNOSIS — Z808 Family history of malignant neoplasm of other organs or systems: Secondary | ICD-10-CM | POA: Insufficient documentation

## 2020-03-31 DIAGNOSIS — Z923 Personal history of irradiation: Secondary | ICD-10-CM | POA: Insufficient documentation

## 2020-03-31 DIAGNOSIS — Z801 Family history of malignant neoplasm of trachea, bronchus and lung: Secondary | ICD-10-CM | POA: Insufficient documentation

## 2020-03-31 DIAGNOSIS — Z8249 Family history of ischemic heart disease and other diseases of the circulatory system: Secondary | ICD-10-CM | POA: Insufficient documentation

## 2020-03-31 DIAGNOSIS — R42 Dizziness and giddiness: Secondary | ICD-10-CM | POA: Insufficient documentation

## 2020-03-31 DIAGNOSIS — Z888 Allergy status to other drugs, medicaments and biological substances status: Secondary | ICD-10-CM | POA: Insufficient documentation

## 2020-03-31 DIAGNOSIS — D696 Thrombocytopenia, unspecified: Secondary | ICD-10-CM | POA: Diagnosis not present

## 2020-03-31 DIAGNOSIS — Z8042 Family history of malignant neoplasm of prostate: Secondary | ICD-10-CM | POA: Insufficient documentation

## 2020-03-31 DIAGNOSIS — E039 Hypothyroidism, unspecified: Secondary | ICD-10-CM | POA: Insufficient documentation

## 2020-03-31 DIAGNOSIS — Z79899 Other long term (current) drug therapy: Secondary | ICD-10-CM | POA: Insufficient documentation

## 2020-03-31 DIAGNOSIS — I700 Atherosclerosis of aorta: Secondary | ICD-10-CM | POA: Insufficient documentation

## 2020-03-31 DIAGNOSIS — R161 Splenomegaly, not elsewhere classified: Secondary | ICD-10-CM | POA: Insufficient documentation

## 2020-03-31 DIAGNOSIS — Z9221 Personal history of antineoplastic chemotherapy: Secondary | ICD-10-CM | POA: Insufficient documentation

## 2020-03-31 DIAGNOSIS — R1012 Left upper quadrant pain: Secondary | ICD-10-CM | POA: Insufficient documentation

## 2020-03-31 LAB — COMPREHENSIVE METABOLIC PANEL
ALT: 13 U/L (ref 0–44)
AST: 22 U/L (ref 15–41)
Albumin: 4.1 g/dL (ref 3.5–5.0)
Alkaline Phosphatase: 84 U/L (ref 38–126)
Anion gap: 10 (ref 5–15)
BUN: 15 mg/dL (ref 6–20)
CO2: 24 mmol/L (ref 22–32)
Calcium: 9.1 mg/dL (ref 8.9–10.3)
Chloride: 104 mmol/L (ref 98–111)
Creatinine, Ser: 0.72 mg/dL (ref 0.44–1.00)
GFR, Estimated: >60 mL/min (ref >60)
Glucose, Bld: 95 mg/dL (ref 70–99)
Potassium: 3.2 mmol/L — ABNORMAL LOW (ref 3.5–5.1)
Sodium: 138 mmol/L (ref 135–145)
Total Bilirubin: 2.8 mg/dL — ABNORMAL HIGH (ref 0.3–1.2)
Total Protein: 7.1 g/dL (ref 6.5–8.1)

## 2020-03-31 LAB — CBC WITH DIFFERENTIAL/PLATELET
Abs Immature Granulocytes: 0.01 10*3/uL (ref 0.00–0.07)
Basophils Absolute: 0 10*3/uL (ref 0.0–0.1)
Basophils Relative: 0 %
Eosinophils Absolute: 0 10*3/uL (ref 0.0–0.5)
Eosinophils Relative: 1 %
HCT: 32.9 % — ABNORMAL LOW (ref 36.0–46.0)
Hemoglobin: 11.7 g/dL — ABNORMAL LOW (ref 12.0–15.0)
Immature Granulocytes: 0 %
Lymphocytes Relative: 14 %
Lymphs Abs: 0.5 10*3/uL — ABNORMAL LOW (ref 0.7–4.0)
MCH: 35.8 pg — ABNORMAL HIGH (ref 26.0–34.0)
MCHC: 35.6 g/dL (ref 30.0–36.0)
MCV: 100.6 fL — ABNORMAL HIGH (ref 80.0–100.0)
Monocytes Absolute: 0.2 10*3/uL (ref 0.1–1.0)
Monocytes Relative: 7 %
Neutro Abs: 2.7 10*3/uL (ref 1.7–7.7)
Neutrophils Relative %: 78 %
Platelets: 94 10*3/uL — ABNORMAL LOW (ref 150–400)
RBC: 3.27 MIL/uL — ABNORMAL LOW (ref 3.87–5.11)
RDW: 13.8 % (ref 11.5–15.5)
WBC: 3.4 10*3/uL — ABNORMAL LOW (ref 4.0–10.5)
nRBC: 0 % (ref 0.0–0.2)

## 2020-03-31 MED ORDER — HEPARIN SOD (PORK) LOCK FLUSH 100 UNIT/ML IV SOLN
500.0000 [IU] | Freq: Once | INTRAVENOUS | Status: AC
  Administered 2020-03-31: 500 [IU] via INTRAVENOUS

## 2020-04-01 ENCOUNTER — Ambulatory Visit (HOSPITAL_COMMUNITY)
Admission: RE | Admit: 2020-04-01 | Discharge: 2020-04-01 | Disposition: A | Discharge status: Home / Self Care | Payer: 59 | Source: Ambulatory Visit | Attending: Hematology | Admitting: Hematology

## 2020-04-01 DIAGNOSIS — C649 Malignant neoplasm of unspecified kidney, except renal pelvis: Secondary | ICD-10-CM | POA: Diagnosis not present

## 2020-04-01 DIAGNOSIS — C20 Malignant neoplasm of rectum: Secondary | ICD-10-CM | POA: Diagnosis not present

## 2020-04-01 DIAGNOSIS — R911 Solitary pulmonary nodule: Secondary | ICD-10-CM | POA: Diagnosis not present

## 2020-04-01 LAB — CEA: CEA: 0.7 ng/mL (ref 0.0–4.7)

## 2020-04-01 MED ORDER — IOHEXOL 9 MG/ML PO SOLN
ORAL | Status: AC
  Filled 2020-04-01: qty 1000

## 2020-04-01 MED ORDER — IOHEXOL 300 MG/ML  SOLN
75.0000 mL | Freq: Once | INTRAMUSCULAR | Status: AC | PRN
  Administered 2020-04-01: 75 mL via INTRAVENOUS

## 2020-04-06 ENCOUNTER — Inpatient Hospital Stay (HOSPITAL_BASED_OUTPATIENT_CLINIC_OR_DEPARTMENT_OTHER): Payer: 59 | Admitting: Hematology

## 2020-04-06 ENCOUNTER — Other Ambulatory Visit: Payer: Self-pay

## 2020-04-06 ENCOUNTER — Encounter (HOSPITAL_COMMUNITY): Payer: Self-pay | Admitting: Hematology

## 2020-04-06 VITALS — BP 117/71 | HR 99 | Temp 97.5°F | Resp 16 | Wt 102.9 lb

## 2020-04-06 DIAGNOSIS — D696 Thrombocytopenia, unspecified: Secondary | ICD-10-CM | POA: Diagnosis not present

## 2020-04-06 DIAGNOSIS — C20 Malignant neoplasm of rectum: Secondary | ICD-10-CM

## 2020-04-06 DIAGNOSIS — Z79899 Other long term (current) drug therapy: Secondary | ICD-10-CM | POA: Diagnosis not present

## 2020-04-06 DIAGNOSIS — C787 Secondary malignant neoplasm of liver and intrahepatic bile duct: Secondary | ICD-10-CM | POA: Diagnosis not present

## 2020-04-06 DIAGNOSIS — D72819 Decreased white blood cell count, unspecified: Secondary | ICD-10-CM | POA: Diagnosis not present

## 2020-04-06 DIAGNOSIS — Z87891 Personal history of nicotine dependence: Secondary | ICD-10-CM | POA: Diagnosis not present

## 2020-04-06 DIAGNOSIS — R634 Abnormal weight loss: Secondary | ICD-10-CM | POA: Diagnosis not present

## 2020-04-06 DIAGNOSIS — E039 Hypothyroidism, unspecified: Secondary | ICD-10-CM | POA: Diagnosis not present

## 2020-04-06 MED ORDER — MIRTAZAPINE 30 MG PO TABS: 15.0000 mg | ORAL_TABLET | Freq: Every day | ORAL | 3 refills | Status: DC

## 2020-04-06 NOTE — Progress Notes (Signed)
Samantha Reynolds., [place] [id number]   CLINIC:  Medical Oncology/Hematology  PCP:  Samantha Drown, MD [place] / [place] [address] [phone]   REASON FOR VISIT:  Follow-up for metastatic rectal cancer to liver  PRIOR THERAPY:  1. FOLFOX & vectibix x 7 cycles from 03/14/2018 to 06/27/2018. 2. XRT with Xeloda from 07/30/2018 to 09/06/2018. 3. Right liver lesion microwave ablation on 10/15/2018. 4. Low anterior resection and diverting ileostomy on 12/07/2018. 5. SBRT to liver 60 Gy in 5 fractions from 11/15/2019 to 12/13/2019.  NGS Results: Foundation 1 K-RAS/NRAS wild-type, MS--stable, TMB--low  CURRENT THERAPY: Observation  BRIEF ONCOLOGIC HISTORY:  Oncology History  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[place])  02/26/2018 Initial Diagnosis   Rectal cancer ([place])   03/13/2018 Cancer Staging   Staging form: Colon and Rectum, AJCC 8th Edition - Clinical stage from 03/13/2018: Stage IVA (cT3, cN1b, cM1a) - Signed by Samantha Jack, MD on 03/13/2018   03/14/2018 - 06/29/2018 Chemotherapy   The patient had palonosetron (ALOXI) injection 0.25 mg, 0.25 mg, Intravenous,  Once, 7 of 8 cycles Administration: 0.25 mg (03/14/2018), 0.25 mg (03/27/2018), 0.25 mg (04/18/2018), 0.25 mg (05/02/2018), 0.25 mg (05/16/2018), 0.25 mg (06/05/2018), 0.25 mg (06/27/2018) leucovorin 800 mg in dextrose 5 % 250 mL infusion, 772 mg, Intravenous,  Once, 7 of 8 cycles Administration: 800 mg (03/14/2018), 800 mg (03/27/2018), 800 mg (05/02/2018), 800 mg (05/16/2018), 700 mg (04/18/2018), 800 mg (06/05/2018), 800 mg (06/27/2018) oxaliplatin (ELOXATIN) 165 mg in dextrose 5 % 500 mL chemo infusion, 85 mg/m2 = 165 mg, Intravenous,  Once, 6 of 7 cycles Dose modification: 68 mg/m2 (80 % of original dose 85 mg/m2, Cycle 4, Reason: Provider Judgment) Administration: 165 mg (03/14/2018), 165 mg (03/27/2018), 130 mg (05/02/2018), 130 mg (05/16/2018), 130 mg (06/05/2018), 130 mg  (06/27/2018) panitumumab (VECTIBIX) 500 mg in sodium chloride 0.9 % 100 mL chemo infusion, 480 mg, Intravenous,  Once, 4 of 5 cycles Administration: 500 mg (04/18/2018), 480 mg (05/02/2018), 480 mg (05/16/2018), 480 mg (06/05/2018) fluorouracil (ADRUCIL) chemo injection 750 mg, 400 mg/m2 = 750 mg (100 % of original dose 400 mg/m2), Intravenous,  Once, 6 of 7 cycles Dose modification: 400 mg/m2 (original dose 400 mg/m2, Cycle 1), 400 mg/m2 (original dose 400 mg/m2, Cycle 3) Administration: 750 mg (03/14/2018), 750 mg (04/18/2018), 750 mg (05/02/2018), 750 mg (05/16/2018), 750 mg (06/05/2018), 750 mg (06/27/2018) fosaprepitant (EMEND) 150 mg, dexamethasone (DECADRON) 12 mg in sodium chloride 0.9 % 145 mL IVPB, , Intravenous,  Once, 4 of 5 cycles Administration:  (05/02/2018),  (05/16/2018),  (06/05/2018),  (06/27/2018) fluorouracil (ADRUCIL) 4,650 mg in sodium chloride 0.9 % 57 mL chemo infusion, 2,400 mg/m2 = 4,650 mg, Intravenous, 1 Day/Dose, 7 of 8 cycles Administration: 4,650 mg (03/14/2018), 4,650 mg (03/27/2018), 4,650 mg (04/18/2018), 4,650 mg (05/02/2018), 4,650 mg (05/16/2018), 4,650 mg (06/05/2018), 4,650 mg (06/27/2018)  for chemotherapy treatment.      CANCER STAGING: Cancer Staging Malignant neoplasm of rectum [facility]) Staging form: Colon and Rectum, AJCC 8th Edition - Clinical stage from 03/13/2018: Stage IVA (cT3, cN1b, cM1a) - Signed by Samantha Jack, MD on 03/13/2018   INTERVAL HISTORY:  Samantha Reynolds, a 55 y.o. 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 Samantha Reynolds was last seen by Samantha Reynolds on 03/09/2020.   Today Samantha Reynolds is accompanied by her husband and Samantha Reynolds reports feeling okay. Her appetite is decreased and Samantha Reynolds continues losing weight. Samantha Reynolds is drinking Ensure Clear  as needed, less than 1 can per day, and eating 3 small meals per day. Her taste is intact, but Samantha Reynolds does not like the taste of anything. Samantha Reynolds started taking Remeron 15 mg QHS and Samantha Reynolds thinks that  her appetite has improved slightly. Samantha Reynolds complains of constant abdominal gnawing sensation which is not relieved by eating. Samantha Reynolds continues having intermittent alternating diarrhea and hard stools. Samantha Reynolds stopped taking multivitamin tablets with iron on 12/27 since it was causing her LUQ stomach ache and irritation. Samantha Reynolds takes Bentyl in the morning and before lunch.  Samantha Reynolds is getting her TSH checked by her endocrinologist (Samantha Reynolds).   REVIEW OF SYSTEMS:  Review of Systems  Constitutional: Positive for appetite change (50%) and fatigue (depleted).  Gastrointestinal: Positive for abdominal pain (LUQ gnawing sensation), constipation (intermittent; alternating w/ diarrhea), diarrhea (intermittent; alternating w/ hard stools) and nausea.  Neurological: Positive for dizziness (when standing) and numbness (L foot).  Psychiatric/Behavioral: Positive for depression.  All other systems reviewed and are negative.   PAST MEDICAL/SURGICAL HISTORY:  Past Medical History:  Diagnosis Date  . Anxiety   . Cancer [facility]) 2013   thyroid  . Endometrial polyp   . Endometrial thickening on ultra sound   . GERD (gastroesophageal reflux disease)   . 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   Worse in her 15s when Samantha Reynolds was ill  . H/O Hashimoto thyroiditis   . History of chemotherapy   . History of radiation therapy   . 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 Hyperlipidemia 11/09/2015  . Hypothyroidism   . 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[place]) 02/26/2018   Past Surgical History:  Procedure Laterality Date  . BIOPSY  02/19/2018   Procedure: BIOPSY;  Surgeon: Samantha Houston, MD;  [facility];  Service: Endoscopy;;  rectum  . COLONOSCOPY N/A 08/20/2015   Procedure: COLONOSCOPY;  Surgeon: Samantha Houston, MD;  [facility];  Service: Endoscopy;  Laterality: N/A;  730  . [place]  ENDOMETRIAL ABLATION  08-13-2004  . DIVERTING ILEOSTOMY N/A 12/07/2018   Procedure: DIVERTING LOOP ILEOSTOMY;  Surgeon: Leighton Ruff, MD;  [facility]: [facility];  Service: General;  Laterality: N/A;  . FLEXIBLE SIGMOIDOSCOPY N/A 02/19/2018   Procedure: FLEXIBLE SIGMOIDOSCOPY;  Surgeon: Samantha Houston, MD;  [facility];  Service: Endoscopy;  Laterality: N/A;  . HYSTEROSCOPY WITH D & C N/A 04/30/2015   Procedure: DILATATION AND CURETTAGE / INTENDED HYSTEROSCOPY;  Surgeon: Dian Queen, MD;  Location: [facility];  Service: Gynecology;  Laterality: N/A;  . ILEOSTOMY CLOSURE N/A 04/17/2019   Procedure: LOOP ILEOSTOMY REVERSAL;  Surgeon: Leighton Ruff, MD;  [facility];  Service: General;  Laterality: N/A;  . IR US GUIDE BX ASP/DRAIN  03/09/2018  . LAPAROSCOPIC CHOLECYSTECTOMY  04/1996  . LAPAROSCOPY N/A 12/11/2018   Procedure: LAPAROSCOPY  ILEOSTOMY REVISION AND ABDOMINAL WASHOUT;  Surgeon: Leighton Ruff, MD;  [facility];  Service: General;  Laterality: N/A;  . Liver Microwave   10/17/2018  . POLYPECTOMY  08/20/2015   Procedure: POLYPECTOMY;  Surgeon: Samantha Houston, MD;  [facility];  Service: Endoscopy;;  Splenic flexure polypectomy  . POLYPECTOMY  02/19/2018   Procedure: POLYPECTOMY;  Surgeon: Samantha Houston, MD;  [facility];  Service: Endoscopy;;  rectum  . PORTACATH PLACEMENT N/A 03/14/2018   Procedure: INSERTION PORT-A-CATH;  Surgeon: Arnoldo Morale,  [name], MD;  Location: [facility];  Service: General;  Laterality: N/A;  . REDUCTION INCARCERATED UTERUS  06-20-2000   intrauterine preg. 13 wks /  urinary retention  . THYROID LOBECTOMY  11/24/2011   Procedure: THYROID LOBECTOMY;  Surgeon: Earnstine Regal, MD;  [facility];  Service: General;  Laterality: Left;  Left Thyroid Lobectomy  . TUBAL LIGATION  2002  . XI ROBOTIC ASSISTED LOWER ANTERIOR RESECTION N/A 12/07/2018   Procedure: XI ROBOTIC ASSISTED LOWER ANTERIOR RESECTION, SPENIC  FLEXURE IMMOBILIZATION, COLOANAL ANASTOMOSIS, RIGID PROCTOSCOPY;  Surgeon: Leighton Ruff, MD;  [facility];  Service: General;  Laterality: N/A;    SOCIAL HISTORY:  Social History   Socioeconomic History  . Marital status: Married    Spouse name: Not on file  . Number of children: 4  . Years of education: Not on file  . 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 Smoking status: Former Smoker    Packs/day: 0.25    Years: 10.00    Pack years: 2.50    Types: Cigarettes    Quit date: 10/31/1991    Years since quitting: 28.4  . Smokeless tobacco: Never Used  Vaping Use  . Vaping Use: Never used  Substance and Sexual Activity  . Alcohol use: No  . Drug use: No  . Sexual activity: Not Currently  Other Topics Concern  . Not on file  Social History Narrative   Lives with husband [name] and 65 year old son [name]   Husband is Interior and spatial designer of [employer]   Social Determinants of Health   Financial Resource Strain: Not on [employer] Insecurity: Not on file  Transportation Needs: Not on file  Physical Activity: Not on file  Stress: Not on file  Social Connections: Not on file  Intimate Partner Violence: Not on file    FAMILY HISTORY:  Family History  Problem Relation Age of Onset  . Hypertension Mother   . Hypertension Father   . Prostate cancer Father   . Cancer Maternal Aunt        spine/back  . Cancer Paternal Grandfather        lung  . Healthy Son   . Healthy Son   . Healthy Son   . Healthy Son   . Colon cancer Neg Hx     CURRENT MEDICATIONS:  Current Outpatient Medications  Medication Sig Dispense Refill  . ALPRAZolam (XANAX) 0.5 MG tablet Take one tablet tid 90 tablet 2  . amitriptyline (ELAVIL) 10 MG tablet Take 1 tablet (10 mg total) by mouth in the morning and at bedtime. 60 tablet 2  . ARMOUR THYROID 60 MG tablet Take 60 mg by mouth every morning.    . dicyclomine (BENTYL) 10 MG  capsule Take 1 capsule (10 mg total) by mouth 3 (three) times daily before meals. 90 capsule 5  . fluticasone (CUTIVATE) 0.05 % cream Apply 1 application topically daily as needed (irritatiton).    Marland Kitchen lidocaine-prilocaine (EMLA) cream Apply small amount to port site one hour prior to appointment and cover with plastic wrap. 30 g 3  . loperamide (IMODIUM) 2 MG capsule Take 1 capsule (2 mg total) by mouth 3 (three) times daily. 30 capsule 0  . mirtazapine (REMERON) 15 MG tablet Take 1 tablet (15 mg total) by mouth at bedtime. 39 tablet 0  . Multiple Vitamin (MULTI-VITAMIN) tablet Take by mouth.    Marland Kitchen  ondansetron (ZOFRAN) 4 MG tablet Take 1 tablet (4 mg total) by mouth every 8 (eight) hours as needed for nausea or vomiting. 30 tablet 2  . OVER THE COUNTER MEDICATION Multivitamin w/iron    . prochlorperazine (COMPAZINE) 10 MG tablet Take 1 tablet (10 mg total) by mouth every 6 (six) hours as needed for nausea or vomiting. 60 tablet 3  . traMADol (ULTRAM) 50 MG tablet Take 1 tablet (50 mg total) by mouth every 12 (twelve) hours as needed for moderate pain or severe pain (cancer pain, s.p sbrt). 30 tablet 0   No current facility-administered medications for this visit.   Facility-Administered Medications Ordered in Other Visits  Medication Dose Route Frequency Provider Last Rate Last Admin  . clindamycin (CLEOCIN) 900 mg in dextrose 5 % 50 mL IVPB  900 mg Intravenous 60 min Pre-Op Leighton Ruff, MD       And  . gentamicin (GARAMYCIN) 350 mg in dextrose 5 % 50 mL IVPB  5 mg/kg Intravenous 60 min Pre-Op Leighton Ruff, MD      . clindamycin (CLEOCIN) 900 mg in dextrose 5 % 50 mL IVPB  900 mg Intravenous 60 min Pre-Op Leighton Ruff, MD       And  . gentamicin (GARAMYCIN) 5 mg/kg in dextrose 5 % 50 mL IVPB  5 mg/kg Intravenous 60 min Pre-Op Leighton Ruff, MD      . sodium chloride 0.9 % 1,000 mL with potassium chloride 20 mEq, magnesium sulfate 2 g infusion   Intravenous Once Samantha Jack, MD         ALLERGIES:  Allergies  Allergen Reactions  . Demerol [Meperidine] Itching    All over the body.  . Penicillins Hives     childhood, does not remember if it spread all over the body or not. Did it involve swelling of the face/tongue/throat, SOB, or low BP? No Did it involve sudden or severe rash/hives, skin peeling, or any reaction on the inside of your mouth or nose? Yes Did you need to seek medical attention at a hospital or doctor's office? Yes When did it last happen?childhood allergy If all above answers are "NO", may proceed with cephalosporin use.   . Other Hives and Other (See Comments)    Fresh coconut   . Vancomycin Rash    Will need Benadryl prior to administration per IV. "Red man syndrome"    PHYSICAL EXAM:  Performance status (ECOG): 0 - Asymptomatic  Vitals:   04/06/20 1624  BP: 117/71  Pulse: 99  Resp: 16  Temp: (!) 97.5 F (36.4 C)  SpO2: 100%   Wt Readings from Last 3 Encounters:  04/06/20 102 lb 14.4 oz (46.7 kg)  03/09/20 103 lb 4 oz (46.8 kg)  03/06/20 104 lb (47.2 kg)   Physical Exam Vitals reviewed.  Constitutional:      Appearance: Normal appearance.  Cardiovascular:     Rate and Rhythm: Normal rate and regular rhythm.     Pulses: Normal pulses.     Heart sounds: Normal heart sounds.  Pulmonary:     Effort: Pulmonary effort is normal.     Breath sounds: Normal breath sounds.  Abdominal:     Palpations: Abdomen is soft. There is no mass.     Tenderness: There is abdominal tenderness in the left upper quadrant.  Skin:    Coloration: Skin is jaundiced.  Neurological:     General: No focal deficit present.     Mental Status: Samantha Reynolds is alert and oriented  to person, place, and time.  Psychiatric:        Mood and Affect: Mood normal.        Behavior: Behavior normal.      LABORATORY DATA:  I have reviewed the labs as listed.  CBC Latest Ref Rng & Units 03/31/2020 03/06/2020 01/20/2020  WBC 4.0 - 10.5 K/uL 3.4(L) 3.7(L) 2.5(L)   Hemoglobin 12.0 - 15.0 g/dL 11.7(L) 11.3(L) 10.6(L)  Hematocrit 36.0 - 46.0 % 32.9(L) 32.1(L) 30.6(L)  Platelets 150 - 400 K/uL 94(L) 98(L) 75(L)   CMP Latest Ref Rng & Units 03/31/2020 03/06/2020 01/20/2020  Glucose 70 - 99 mg/dL 95 108(H) 86  BUN 6 - 20 mg/dL _0 Creatinine 0.44 - 1.00 mg/dL 0.72 0.55 0.71  Sodium 135 - 145 mmol/L 138 135 136  Potassium 3.5 - 5.1 mmol/L 3.2(L) 3.4(L) 3.3(L)  Chloride 98 - 111 mmol/L 104 102 102  CO2 22 - 32 mmol/L _1 Calcium 8.9 - 10.3 mg/dL 9.1 8.9 8.9  Total Protein 6.5 - 8.1 g/dL 7.1 6.8 6.7  Total Bilirubin 0.3 - 1.2 mg/dL 2.8(H) 2.8(H) 3.3(H)  Alkaline Phos 38 - 126 U/L 84 89 102  AST 15 - 41 U/L _2 ALT 0 - 44 U/L _3 Lab Results  Component Value Date   CEA1 0.7 03/31/2020   CEA1 0.5 01/20/2020   CEA1 2.1 10/18/2019    DIAGNOSTIC IMAGING:  I have independently reviewed the scans and discussed with the patient. CT CHEST ABDOMEN PELVIS W CONTRAST  Result Date: 04/01/2020 CLINICAL DATA:  Follow-up metastatic rectal carcinoma. Assess treatment response. EXAM: CT CHEST, ABDOMEN, AND PELVIS WITH CONTRAST TECHNIQUE: Multidetector CT imaging of the chest, abdomen and pelvis was performed following the standard protocol during bolus administration of intravenous contrast. CONTRAST:  26m OMNIPAQUE IOHEXOL 300 MG/ML  SOLN COMPARISON:  01/22/2020 FINDINGS: CT CHEST FINDINGS Cardiovascular: No acute findings. Stable ascending thoracic aortic aneurysm measuring 4.2 cm in maximum diameter. Mediastinum/Lymph Nodes: No masses or pathologically enlarged lymph nodes identified. Lungs/Pleura: A 7 mm solid nodule in the medial right upper lobe on image 30/4 remains stable since previous study. No other suspicious pulmonary nodules or masses identified. No evidence of pleural effusion. Mild scarring again noted in the posterior left lung base. Musculoskeletal:  No suspicious bone lesions identified. CT ABDOMEN AND PELVIS FINDINGS  Hepatobiliary: 2 ablation defects in the posterior and inferior right hepatic lobe are stable in appearance, without evidence of residual contrast enhancement. Fiducial markers in the central left hepatic lobe are again noted, however no hepatic mass is visualized in this region. Prior cholecystectomy. No evidence of biliary obstruction. Pancreas:  No mass or inflammatory changes. Spleen: Stable 2 cm cystic lesion anterior aspect of the spleen. Stable mild splenomegaly. Adrenals/Urinary tract:  No masses or hydronephrosis. Stomach/Bowel: Surgical anastomosis again seen from previous low anterior resection. Stable mild ill-defined presacral soft tissue density, consistent with post treatment changes. No evidence of obstruction, inflammatory process, or abnormal fluid collections. Vascular/Lymphatic: No pathologically enlarged lymph nodes identified. No abdominal aortic aneurysm. Aortic atherosclerotic calcification noted. Reproductive:  No mass or other significant abnormality identified. Other:  None. Musculoskeletal:  No suspicious bone lesions identified. IMPRESSION: Stable post treatment changes in the liver and perirectal region. No evidence of residual or metastatic carcinoma within the abdomen or pelvis. Stable 7 mm right upper lobe pulmonary nodule. No new or progressive disease within the thorax. Stable 4.2 cm ascending thoracic aortic aneurysm. Recommend annual  imaging followup by CTA or MRA. This recommendation follows 2010 ACCF/AHA/AATS/ACR/ASA/SCA/SCAI/SIR/STS/SVM Guidelines for the Diagnosis and Management of Patients with Thoracic Aortic Disease. Circulation. 2010; 121: S811-S315. Aortic aneurysm NOS (ICD10-I71.9) Stable mild splenomegaly. Aortic Atherosclerosis (ICD10-I70.0). Electronically Signed   By: Marlaine Hind M.D.   On: 04/01/2020 16:37     ASSESSMENT:  1. 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 Liver biopsy on 03/09/2018 consistent with adenocarcinoma. 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 -7 cycles of FOLFOX with vectibix completed on 06/27/2018. -XRT with Xeloda from 07/30/2018 through 09/06/2018. -Right liver lesion microwave ablation on 10/15/2018 at [facility]. -Low anterior resection and diverting ileostomy on 12/07/2018, pathology [id number], 0/6 lymph nodes positive, margins negative. -Loop ileostomy reversal on 04/17/2019. -CTAP on 07/10/2019 showed left lower lobe pulmonary nodule increased by 1 mm, measuring 8 mm. 3 mm left upper lobe lung nodule is unchanged. Probable 4 mm right apical pulmonary nodule felt to be enlarged from 2 mm from October 2020. Vague hypoattenuation within the posterior right hepatic lobe measures 1.5 cm, measuring 1.2 cm in October 2020. Subtle subcapsular right hepatic lobe hypoattenuating 1 cm lesion was not seen on prior scans. -We reviewed results of MRI of the abdomen with and without contrast from 07/16/2019. 2 enhancing lesions in the right hepatic lobe, largest one measuring 1.5 x 1.0 cm. Inferior lesion is measured as 1 cm. Stable splenomegaly. -CEA is 2.4. -Microwave ablation of the right hepatic lobe lesion by Dr. Maudie Mercury around 08/15/2019. -SBRT to the left lower lobe lung lesion and left liver lobe lesion on 12/13/2019 [facility]. -CT CAP from 01/22/2020 showed interval enlargement of medial right pulmonary nodule measuring about 7 mm, previously 5 mm.  Evaluation of the new lesion in the left lobe of the liver is limited by metallic fiducial marking clips although lesion appears to be diminished.  Low attenuation ablation sites in the right lobe of the liver unchanged.  Left lower lobe lung lesion also decreased in size. - CT CAP on 04/01/2020 showed stable 7 mm right upper lobe lung nodule.  Left lower lobe nodule/scarring is stable.  2 ablation defects in the posterior and inferior right hepatic lobe are stable in appearance without evidence of  residual contrast-enhancement.  2 cm cystic lesion in the anterior aspect of the spleen is stable.  Stable mild splenomegaly.  2. Hyperbilirubinemia: -Total bilirubin is 5.1. 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 Baseline is between 2 and 3.  3. Hypothyroidism: -Samantha Reynolds is on Armour Thyroid. TSH is 3.6.  4. Weight loss: -Samantha Reynolds had 10 pound weight loss since her last visit 3 months ago. However Samantha Reynolds had ileostomy reversal and is adjusting to new food habits.   PLAN:  1. Stage IV rectal adenocarcinoma: -We have reviewed images of the CT scan and compared it with the prior scan from October. - No significant changes consistent with the progression.  CEA was also normal at 0.7. - I recommend repeating CT CAP in 3 months along with a CEA level. - If there is any progression of the right upper lobe lung nodule, will consider SBRT.  2. 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  Today bilirubin is 2.8.  3. Weight loss: -Samantha Reynolds has lost close to 10 pounds in the last 3 months. - Samantha Reynolds was started on Remeron 15 mg at bedtime but December 2021. - Patient reported slight increase in appetite but did not gain any weight.  Samantha Reynolds in fact last 1 pound.  Samantha Reynolds is drinking 1 can of Ensure clear daily.  Samantha Reynolds is eating 3 small meals per day. - Recommend increasing Remeron to 30 mg at bedtime. - Samantha Reynolds will check her weight in 4 to 6 weeks and call us.  If there is weight improvement we will continue Remeron.  Otherwise will add Marinol.  4. Hypothyroidism: -Continue Armour Thyroid managed by her endocrinologist.  5.  Leukopenia and thrombocytopenia: -Samantha Reynolds has mild leukopenia and thrombocytopenia, likely from splenomegaly.  No intervention necessary.   Orders placed this encounter:  No orders of the defined types were placed in this encounter.    Samantha Jack, MD [place] [phone]   I, Milinda Antis, am acting as a scribe for Dr. Sanda Linger.  I, Samantha Jack MD, have reviewed the above documentation for accuracy and completeness, and I agree with the above.

## 2020-04-06 NOTE — Patient Instructions (Signed)
[place] at [facility] Discharge Instructions  You were seen today by Dr. Delton Coombes. He went over your recent results and scans. Start taking mirtazapine 30 mg at bedtime. You will be scheduled for a CT scan of your chest and abdomen before your next visit. Dr. Delton Coombes will see you back in 3 months for labs and follow up.   Thank you for choosing [place] at [facility] to provide your oncology and hematology care.  To afford each patient quality time with our provider, please arrive at least 15 minutes before your scheduled appointment time.   If you have a lab appointment with the [facility] please come in thru the Main Entrance and check in at the main information desk  You need to re-schedule your appointment should you arrive 10 or more minutes late.  We strive to give you quality time with our providers, and arriving late affects you and other patients whose appointments are after yours.  Also, if you no show three or more times for appointments you may be dismissed from the clinic at the providers discretion.     Again, thank you for choosing [facility]  Our hope is that these requests will decrease the amount of time that you wait before being seen by our physicians.       _____________________________________________________________  Should you have questions after your visit to [facility], please contact our office at [phone] between the hours of 8:00 a.m. and 4:30 p.m.  Voicemails left after 4:00 p.m. will not be returned until the following business day.  For prescription refill requests, have your pharmacy contact our office and allow 72 hours.    Cancer Center Support Programs:   > Cancer Support Group  2nd Tuesday of the month 1pm-2pm, Journey Room

## 2020-04-07 ENCOUNTER — Other Ambulatory Visit (HOSPITAL_COMMUNITY): Payer: Self-pay | Admitting: Hematology

## 2020-04-07 MED ORDER — MIRTAZAPINE 30 MG PO TABS: 30.0000 mg | ORAL_TABLET | Freq: Every day | ORAL | 3 refills | Status: DC

## 2020-04-07 MED FILL — MIRTAZAPINE 30 MG TABLET: 30 | 30 days supply | Qty: 30 | Fill #0

## 2020-04-07 NOTE — Addendum Note (Signed)
Addended by: Derek Jack on: 04/07/2020 12:08 PM   Modules accepted: Orders

## 2020-04-10 ENCOUNTER — Ambulatory Visit (HOSPITAL_COMMUNITY): Payer: 59

## 2020-04-10 NOTE — Progress Notes (Signed)
Nutrition Follow-up:  Patient with stage IV rectal cancer with mets to liver.  Patient not currently in treatment.    Spoke with patient via phone today.  Patient reports that appetite is a little bit better. Remeron has been increased earlier this week.  Patient reports that nausea in the evening has improved.  Was able to eat chicken nachos last night for dinner and able to eat more than usual.  Able to enjoy lactose free ice cream.  Was not able to tolerate orange sherbet and has not made any of the shakes.  Daughter in law coming over this weekend and hoping to try some out.  Drinking ensure clear. Taste is off but can taste chocolate foods.  Added hershey kisses in the evening.  Reports eating pizza recently 2 pieces vs 1.  Has been able to drink lactose free milk.     Medications: remeron Labs: reviewed  Anthropometrics:   Weight 102 lb 14.4 oz slight decrease from 103 lb on 12/13   NUTRITION DIAGNOSIS: Inadequate oral intake slight improvement   INTERVENTION:  Patient to try adding chocolate syrup in lactose free milk and drink for added calories and protein.   Recommend MVI, chewable or gummy Continue to offer encouragement to try  foods high in calories or protein.   Continue higher dose of remeron     MONITORING, EVALUATION, GOAL: weight trends, intake   NEXT VISIT: Feb 28 phone f/u  Samantha Reynolds, [place], [place] Registered Dietitian [phone] (mobile)

## 2020-04-21 ENCOUNTER — Other Ambulatory Visit (HOSPITAL_COMMUNITY): Payer: 59

## 2020-04-22 ENCOUNTER — Ambulatory Visit (HOSPITAL_COMMUNITY): Payer: 59

## 2020-04-23 ENCOUNTER — Ambulatory Visit (HOSPITAL_COMMUNITY): Payer: 59 | Admitting: Hematology

## 2020-04-27 ENCOUNTER — Ambulatory Visit (HOSPITAL_COMMUNITY): Payer: 59 | Admitting: Hematology

## 2020-04-27 DIAGNOSIS — Z85048 Personal history of other malignant neoplasm of rectum, rectosigmoid junction, and anus: Secondary | ICD-10-CM | POA: Diagnosis not present

## 2020-04-28 ENCOUNTER — Other Ambulatory Visit (INDEPENDENT_AMBULATORY_CARE_PROVIDER_SITE_OTHER): Payer: Self-pay | Admitting: Internal Medicine

## 2020-04-28 MED ORDER — DICYCLOMINE HCL 10 MG PO CAPS: 10.0000 mg | ORAL_CAPSULE | Freq: Three times a day (TID) | ORAL | 5 refills | Status: DC

## 2020-04-28 MED FILL — DICYCLOMINE 10 MG CAPSULE: 10 | 30 days supply | Qty: 90 | Fill #0

## 2020-04-29 MED FILL — ALPRAZolam 0.5 MG TABS: 0.5 | 30 days supply | Qty: 90 | Fill #2

## 2020-04-29 MED FILL — AMITRIPTYLINE HCL 10 MG TAB: 10 | 30 days supply | Qty: 60 | Fill #2

## 2020-05-08 MED FILL — MIRTAZAPINE 30 MG TABLET: 30 | 30 days supply | Qty: 30 | Fill #1

## 2020-05-22 ENCOUNTER — Ambulatory Visit (HOSPITAL_COMMUNITY): Payer: 59

## 2020-05-22 NOTE — Progress Notes (Signed)
Nutrition Follow-up:  Patient with stage IV rectal cancer with mets to liver.  Patient not currently in treatment.    Spoke with patient via phone today.  Patient reports that appetite is better and she is eating bigger volume of foods than the last time we talked.  Reports that she tried the Fairlife chocolate milk and really likes it and has been drinking it for breakfast.  Still eating her shortbread cookies for breakfast and sometimes white powdered doughnuts.  Wanting to try some cereal with lactose free milk.  Lunch is sometimes a sandwich (egg, [place]) recently ate barbecue sandwich for lunch.  Has been able to eat the whole sandwich with something else veggie straws, pretzels).  Suppers have been barbecue chicken with potato or beef stew with potatoes, carrots and onions or hamburger steak with potato, spaghetti.  After dinner has been eating tootsie pop to help with dry mouth.      Medications: reviewed   Labs: reviewed  Anthropometrics:   Per patient report weight is 108 lb  102 lb on 1/10   NUTRITION DIAGNOSIS: Inadequate oral intake improving   INTERVENTION:  Congratulated patient on weight gain!   Discussed cereal options to try for breakfast. Plain waffle and english muffin also could be options for patient.   Patient to continue to increase calories and protein to continue weight gain    MONITORING, EVALUATION, GOAL: weight trends, intake   NEXT VISIT: March 25 phone f/u  Kadiatou Oplinger B. Zenia Resides, [place], [place] Registered Dietitian [phone] (mobile)

## 2020-06-04 ENCOUNTER — Encounter: Payer: Self-pay | Admitting: Family Medicine

## 2020-06-04 ENCOUNTER — Other Ambulatory Visit: Payer: Self-pay | Admitting: Family Medicine

## 2020-06-04 MED ORDER — ALPRAZOLAM 0.5 MG PO TABS: ORAL_TABLET | ORAL | 2 refills | Status: DC

## 2020-06-04 MED FILL — ALPRAZolam 0.5 MG TABS: 0.5 | 30 days supply | Qty: 90 | Fill #0

## 2020-06-04 NOTE — Telephone Encounter (Signed)
Last seen for xanax 07/12/19

## 2020-06-19 ENCOUNTER — Ambulatory Visit (HOSPITAL_COMMUNITY): Payer: 59

## 2020-06-19 NOTE — Progress Notes (Signed)
Nutrition Follow-up:   Nutrition follow-up  Patient with stage IV rectal cancer with mets to liver. Patient is not currently in treatment  Spoke with patient via phone. She reports having more good days than bad days. She has not experienced any nausea in the last two weeks and is not taking Bentyl. Her appetite is improving and continues to take Remeron. She either has a bowl of Frosted Flakes with Fairlife milk or waffles with a glass of chocolate milk for breakfast, cheese quesadilla, chips, cookies for lunch, and recalls BBQ chicken, cooked carrots, green beans, spaghetti, BBQ, pork, mashed potatoes for dinner. Patient reports episode of diarrhea after eating at [place], says she ate 3 hush puppies which were too greasy. She is drinking sweet tea and 2 (16oz) Dr. Malachi Bonds daily.   Medications: reviewed  Labs: reviewed  Anthropometrics: Per pt report weight is 112 lb today which has increased from 108 lb on 2/25   NUTRITION DIAGNOSIS: Inadequate oral intake improving   INTERVENTION:  Encouraged patient to continue trying new foods Patient to continue increasing calories and protein for weight gain Discussed trying soft fruits without skin   MONITORING, EVALUATION, GOAL: weight trends, oral intake   NEXT VISIT: Thursday, April 7 in clinic

## 2020-06-29 ENCOUNTER — Other Ambulatory Visit (HOSPITAL_COMMUNITY): Payer: Self-pay

## 2020-06-29 DIAGNOSIS — C19 Malignant neoplasm of rectosigmoid junction: Secondary | ICD-10-CM

## 2020-06-30 ENCOUNTER — Other Ambulatory Visit (HOSPITAL_COMMUNITY): Payer: 59

## 2020-06-30 ENCOUNTER — Other Ambulatory Visit: Payer: Self-pay

## 2020-06-30 ENCOUNTER — Encounter (HOSPITAL_COMMUNITY)
Admission: RE | Admit: 2020-06-30 | Discharge: 2020-06-30 | Disposition: A | Discharge status: Home / Self Care | Payer: 59 | Source: Ambulatory Visit | Attending: General Surgery | Admitting: General Surgery

## 2020-06-30 ENCOUNTER — Encounter (HOSPITAL_COMMUNITY): Payer: Self-pay

## 2020-06-30 DIAGNOSIS — Z79899 Other long term (current) drug therapy: Secondary | ICD-10-CM | POA: Diagnosis not present

## 2020-06-30 DIAGNOSIS — Z85850 Personal history of malignant neoplasm of thyroid: Secondary | ICD-10-CM | POA: Diagnosis not present

## 2020-06-30 DIAGNOSIS — Z808 Family history of malignant neoplasm of other organs or systems: Secondary | ICD-10-CM | POA: Diagnosis not present

## 2020-06-30 DIAGNOSIS — Z9221 Personal history of antineoplastic chemotherapy: Secondary | ICD-10-CM | POA: Diagnosis not present

## 2020-06-30 DIAGNOSIS — Z923 Personal history of irradiation: Secondary | ICD-10-CM | POA: Diagnosis not present

## 2020-06-30 DIAGNOSIS — C20 Malignant neoplasm of rectum: Secondary | ICD-10-CM | POA: Insufficient documentation

## 2020-06-30 DIAGNOSIS — G479 Sleep disorder, unspecified: Secondary | ICD-10-CM | POA: Diagnosis not present

## 2020-06-30 DIAGNOSIS — Z8042 Family history of malignant neoplasm of prostate: Secondary | ICD-10-CM | POA: Diagnosis not present

## 2020-06-30 DIAGNOSIS — E039 Hypothyroidism, unspecified: Secondary | ICD-10-CM | POA: Diagnosis not present

## 2020-06-30 DIAGNOSIS — Z8249 Family history of ischemic heart disease and other diseases of the circulatory system: Secondary | ICD-10-CM | POA: Diagnosis not present

## 2020-06-30 DIAGNOSIS — Z87891 Personal history of nicotine dependence: Secondary | ICD-10-CM | POA: Diagnosis not present

## 2020-06-30 DIAGNOSIS — C787 Secondary malignant neoplasm of liver and intrahepatic bile duct: Secondary | ICD-10-CM | POA: Insufficient documentation

## 2020-06-30 DIAGNOSIS — R5383 Other fatigue: Secondary | ICD-10-CM | POA: Diagnosis not present

## 2020-06-30 DIAGNOSIS — Z801 Family history of malignant neoplasm of trachea, bronchus and lung: Secondary | ICD-10-CM | POA: Diagnosis not present

## 2020-06-30 MED ORDER — HEPARIN SOD (PORK) LOCK FLUSH 100 UNIT/ML IV SOLN
500.0000 [IU] | Freq: Once | INTRAVENOUS | Status: AC
  Administered 2020-06-30: 500 [IU] via INTRAVENOUS

## 2020-07-01 ENCOUNTER — Encounter (HOSPITAL_COMMUNITY): Payer: Self-pay

## 2020-07-01 ENCOUNTER — Ambulatory Visit (HOSPITAL_COMMUNITY)
Admission: RE | Admit: 2020-07-01 | Discharge: 2020-07-01 | Disposition: A | Discharge status: Home / Self Care | Payer: 59 | Source: Ambulatory Visit | Attending: Hematology | Admitting: Hematology

## 2020-07-01 DIAGNOSIS — C20 Malignant neoplasm of rectum: Secondary | ICD-10-CM | POA: Diagnosis not present

## 2020-07-01 DIAGNOSIS — I700 Atherosclerosis of aorta: Secondary | ICD-10-CM | POA: Diagnosis not present

## 2020-07-01 DIAGNOSIS — I712 Thoracic aortic aneurysm, without rupture: Secondary | ICD-10-CM | POA: Diagnosis not present

## 2020-07-01 DIAGNOSIS — R911 Solitary pulmonary nodule: Secondary | ICD-10-CM | POA: Diagnosis not present

## 2020-07-01 DIAGNOSIS — Z9049 Acquired absence of other specified parts of digestive tract: Secondary | ICD-10-CM | POA: Diagnosis not present

## 2020-07-01 MED ORDER — IOHEXOL 300 MG/ML  SOLN
100.0000 mL | Freq: Once | INTRAMUSCULAR | Status: AC | PRN
  Administered 2020-07-01: 100 mL via INTRAVENOUS

## 2020-07-02 ENCOUNTER — Other Ambulatory Visit: Payer: Self-pay

## 2020-07-02 ENCOUNTER — Inpatient Hospital Stay (HOSPITAL_COMMUNITY): Payer: 59 | Attending: Hematology | Admitting: Hematology

## 2020-07-02 ENCOUNTER — Encounter (HOSPITAL_COMMUNITY): Payer: 59 | Admitting: Dietician

## 2020-07-02 ENCOUNTER — Other Ambulatory Visit (HOSPITAL_COMMUNITY): Payer: Self-pay

## 2020-07-02 ENCOUNTER — Encounter (HOSPITAL_COMMUNITY)
Admission: RE | Admit: 2020-07-02 | Discharge: 2020-07-02 | Disposition: A | Discharge status: Home / Self Care | Payer: 59 | Source: Ambulatory Visit | Attending: Hematology | Admitting: Hematology

## 2020-07-02 VITALS — BP 116/80 | HR 97 | Temp 99.0°F | Resp 18 | Wt 112.0 lb

## 2020-07-02 DIAGNOSIS — Z85850 Personal history of malignant neoplasm of thyroid: Secondary | ICD-10-CM | POA: Insufficient documentation

## 2020-07-02 DIAGNOSIS — C189 Malignant neoplasm of colon, unspecified: Secondary | ICD-10-CM | POA: Diagnosis not present

## 2020-07-02 DIAGNOSIS — G479 Sleep disorder, unspecified: Secondary | ICD-10-CM | POA: Insufficient documentation

## 2020-07-02 DIAGNOSIS — Z87891 Personal history of nicotine dependence: Secondary | ICD-10-CM | POA: Insufficient documentation

## 2020-07-02 DIAGNOSIS — R5383 Other fatigue: Secondary | ICD-10-CM | POA: Insufficient documentation

## 2020-07-02 DIAGNOSIS — Z79899 Other long term (current) drug therapy: Secondary | ICD-10-CM | POA: Insufficient documentation

## 2020-07-02 DIAGNOSIS — Z8249 Family history of ischemic heart disease and other diseases of the circulatory system: Secondary | ICD-10-CM | POA: Insufficient documentation

## 2020-07-02 DIAGNOSIS — Z923 Personal history of irradiation: Secondary | ICD-10-CM | POA: Insufficient documentation

## 2020-07-02 DIAGNOSIS — Z808 Family history of malignant neoplasm of other organs or systems: Secondary | ICD-10-CM | POA: Insufficient documentation

## 2020-07-02 DIAGNOSIS — Z9221 Personal history of antineoplastic chemotherapy: Secondary | ICD-10-CM | POA: Insufficient documentation

## 2020-07-02 DIAGNOSIS — C787 Secondary malignant neoplasm of liver and intrahepatic bile duct: Secondary | ICD-10-CM | POA: Diagnosis not present

## 2020-07-02 DIAGNOSIS — E039 Hypothyroidism, unspecified: Secondary | ICD-10-CM | POA: Diagnosis not present

## 2020-07-02 DIAGNOSIS — Z8042 Family history of malignant neoplasm of prostate: Secondary | ICD-10-CM | POA: Insufficient documentation

## 2020-07-02 DIAGNOSIS — C20 Malignant neoplasm of rectum: Secondary | ICD-10-CM | POA: Diagnosis not present

## 2020-07-02 DIAGNOSIS — Z801 Family history of malignant neoplasm of trachea, bronchus and lung: Secondary | ICD-10-CM | POA: Insufficient documentation

## 2020-07-02 LAB — COMPREHENSIVE METABOLIC PANEL
ALT: 12 U/L (ref 0–44)
AST: 21 U/L (ref 15–41)
Albumin: 4.2 g/dL (ref 3.5–5.0)
Alkaline Phosphatase: 94 U/L (ref 38–126)
Anion gap: 9 (ref 5–15)
BUN: 19 mg/dL (ref 6–20)
CO2: 24 mmol/L (ref 22–32)
Calcium: 8.7 mg/dL — ABNORMAL LOW (ref 8.9–10.3)
Chloride: 104 mmol/L (ref 98–111)
Creatinine, Ser: 0.79 mg/dL (ref 0.44–1.00)
GFR, Estimated: >60 mL/min (ref >60)
Glucose, Bld: 108 mg/dL — ABNORMAL HIGH (ref 70–99)
Potassium: 3.7 mmol/L (ref 3.5–5.1)
Sodium: 137 mmol/L (ref 135–145)
Total Bilirubin: 1.9 mg/dL — ABNORMAL HIGH (ref 0.3–1.2)
Total Protein: 7.3 g/dL (ref 6.5–8.1)

## 2020-07-02 LAB — CBC WITH DIFFERENTIAL/PLATELET
Abs Immature Granulocytes: 0.01 10*3/uL (ref 0.00–0.07)
Basophils Absolute: 0 10*3/uL (ref 0.0–0.1)
Basophils Relative: 0 %
Eosinophils Absolute: 0 10*3/uL (ref 0.0–0.5)
Eosinophils Relative: 0 %
HCT: 32.9 % — ABNORMAL LOW (ref 36.0–46.0)
Hemoglobin: 11.5 g/dL — ABNORMAL LOW (ref 12.0–15.0)
Immature Granulocytes: 0 %
Lymphocytes Relative: 15 %
Lymphs Abs: 0.4 10*3/uL — ABNORMAL LOW (ref 0.7–4.0)
MCH: 34.3 pg — ABNORMAL HIGH (ref 26.0–34.0)
MCHC: 35 g/dL (ref 30.0–36.0)
MCV: 98.2 fL (ref 80.0–100.0)
Monocytes Absolute: 0.2 10*3/uL (ref 0.1–1.0)
Monocytes Relative: 8 %
Neutro Abs: 2.1 10*3/uL (ref 1.7–7.7)
Neutrophils Relative %: 77 %
Platelets: 117 10*3/uL — ABNORMAL LOW (ref 150–400)
RBC: 3.35 MIL/uL — ABNORMAL LOW (ref 3.87–5.11)
RDW: 13.7 % (ref 11.5–15.5)
WBC: 2.7 10*3/uL — ABNORMAL LOW (ref 4.0–10.5)
nRBC: 0 % (ref 0.0–0.2)

## 2020-07-02 MED ORDER — MIRTAZAPINE 15 MG PO TABS
15.0000 mg | ORAL_TABLET | Freq: Every day | ORAL | 3 refills | Status: DC
  Filled 2020-07-02: qty 30, 30d supply, fill #0
  Filled 2020-08-06: qty 30, 30d supply, fill #1
  Filled 2020-09-06: qty 30, 30d supply, fill #2
  Filled 2020-10-12: qty 30, 30d supply, fill #3

## 2020-07-02 NOTE — Patient Instructions (Signed)
[place] at [facility] Discharge Instructions  You were seen today by Dr. Delton Coombes. He went over your recent results and scans. You will be referred to radiation oncology for treatment of your enlarging right lung nodule. You may start taking Remeron 15 mg at bedtime. You will be scheduled to have a CT scan of your chest and abdomen done before your next visit. Dr. Delton Coombes will see you back in 4 months for labs and follow up.   Thank you for choosing [place] at [facility] to provide your oncology and hematology care.  To afford each patient quality time with our provider, please arrive at least 15 minutes before your scheduled appointment time.   If you have a lab appointment with the [facility] please come in thru the Main Entrance and check in at the main information desk  You need to re-schedule your appointment should you arrive 10 or more minutes late.  We strive to give you quality time with our providers, and arriving late affects you and other patients whose appointments are after yours.  Also, if you no show three or more times for appointments you may be dismissed from the clinic at the providers discretion.     Again, thank you for choosing [facility].  Our hope is that these requests will decrease the amount of time that you wait before being seen by our physicians.       _____________________________________________________________  Should you have questions after your visit to [facility], please contact our office at [phone] between the hours of 8:00 a.m. and 4:30 p.m.  Voicemails left after 4:00 p.m. will not be returned until the following business day.  For prescription refill requests, have your pharmacy contact our office and allow 72 hours.    Cancer Center Support Programs:   > Cancer Support Group  2nd Tuesday of the month 1pm-2pm, Journey Room

## 2020-07-02 NOTE — Progress Notes (Signed)
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 FOLFOX & vectibix x 7 cycles from 03/14/2018 to 06/27/2018. 2. XRT with Xeloda from 07/30/2018 to 09/06/2018. 3. Right liver lesion microwave ablation on 10/15/2018. 4. Low anterior resection and diverting ileostomy on 12/07/2018. 5. SBRT to liver 60 Gy in 5 fractions from 11/15/2019 to 12/13/2019.  NGS Results: Foundation 1 K-RAS/NRAS wild-type, MS--stable, TMB--low  CURRENT THERAPY: Surveillance  BRIEF ONCOLOGIC HISTORY:  Oncology History  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[place])  02/26/2018 Initial Diagnosis   Rectal cancer ([place])   03/13/2018 Cancer Staging   Staging form: Colon and Rectum, AJCC 8th Edition - Clinical stage from 03/13/2018: Stage IVA (cT3, cN1b, cM1a) - Signed by Derek Jack, MD on 03/13/2018   03/14/2018 - 06/29/2018 Chemotherapy   The patient had palonosetron (ALOXI) injection 0.25 mg, 0.25 mg, Intravenous,  Once, 7 of 8 cycles Administration: 0.25 mg (03/14/2018), 0.25 mg (03/27/2018), 0.25 mg (04/18/2018), 0.25 mg (05/02/2018), 0.25 mg (05/16/2018), 0.25 mg (06/05/2018), 0.25 mg (06/27/2018) leucovorin 800 mg in dextrose 5 % 250 mL infusion, 772 mg, Intravenous,  Once, 7 of 8 cycles Administration: 800 mg (03/14/2018), 800 mg (03/27/2018), 800 mg (05/02/2018), 800 mg (05/16/2018), 700 mg (04/18/2018), 800 mg (06/05/2018), 800 mg (06/27/2018) oxaliplatin (ELOXATIN) 165 mg in dextrose 5 % 500 mL chemo infusion, 85 mg/m2 = 165 mg, Intravenous,  Once, 6 of 7 cycles Dose modification: 68 mg/m2 (80 % of original dose 85 mg/m2, Cycle 4, Reason: Provider Judgment) Administration: 165 mg (03/14/2018), 165 mg (03/27/2018), 130 mg (05/02/2018), 130 mg (05/16/2018), 130 mg (06/05/2018), 130 mg  (06/27/2018) panitumumab (VECTIBIX) 500 mg in sodium chloride 0.9 % 100 mL chemo infusion, 480 mg, Intravenous,  Once, 4 of 5 cycles Administration: 500 mg (04/18/2018), 480 mg (05/02/2018), 480 mg (05/16/2018), 480 mg (06/05/2018) fluorouracil (ADRUCIL) chemo injection 750 mg, 400 mg/m2 = 750 mg (100 % of original dose 400 mg/m2), Intravenous,  Once, 6 of 7 cycles Dose modification: 400 mg/m2 (original dose 400 mg/m2, Cycle 1), 400 mg/m2 (original dose 400 mg/m2, Cycle 3) Administration: 750 mg (03/14/2018), 750 mg (04/18/2018), 750 mg (05/02/2018), 750 mg (05/16/2018), 750 mg (06/05/2018), 750 mg (06/27/2018) fosaprepitant (EMEND) 150 mg, dexamethasone (DECADRON) 12 mg in sodium chloride 0.9 % 145 mL IVPB, , Intravenous,  Once, 4 of 5 cycles Administration:  (05/02/2018),  (05/16/2018),  (06/05/2018),  (06/27/2018) fluorouracil (ADRUCIL) 4,650 mg in sodium chloride 0.9 % 57 mL chemo infusion, 2,400 mg/m2 = 4,650 mg, Intravenous, 1 Day/Dose, 7 of 8 cycles Administration: 4,650 mg (03/14/2018), 4,650 mg (03/27/2018), 4,650 mg (04/18/2018), 4,650 mg (05/02/2018), 4,650 mg (05/16/2018), 4,650 mg (06/05/2018), 4,650 mg (06/27/2018)  for chemotherapy treatment.      CANCER STAGING: Cancer Staging Malignant neoplasm of rectum [facility]) Staging form: Colon and Rectum, AJCC 8th Edition - Clinical stage from 03/13/2018: Stage IVA (cT3, cN1b, cM1a) - Signed by Derek Jack, MD on 03/13/2018   INTERVAL HISTORY:  [name], a 55 y.o. 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 Shakema was last seen on 04/06/2020.   Today she is accompanied by her husband and she reports feeling okay. Her appetite has improved since her abdominal pain resolved and her weight has gone up. She eats 3  small meals per day with snacks in between. She is taking Remeron 30 mg QHS. Her sleep has also improved.   REVIEW OF SYSTEMS:  Review of Systems  Constitutional: Positive for appetite change (75%)  and fatigue. Negative for unexpected weight change.  Gastrointestinal: Negative for abdominal pain.  Psychiatric/Behavioral: Positive for sleep disturbance (on Remeron QHS).  All other systems reviewed and are negative.   PAST MEDICAL/SURGICAL HISTORY:  Past Medical History:  Diagnosis Date  . Anxiety   . Cancer [facility]) 2013   thyroid  . Endometrial polyp   . Endometrial thickening on ultra sound   . GERD (gastroesophageal reflux disease)   . 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   Worse in her 70s when she was ill  . H/O Hashimoto thyroiditis   . History of chemotherapy   . History of radiation therapy   . 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 Hyperlipidemia 11/09/2015  . Hypothyroidism   . 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[place]) 02/26/2018   Past Surgical History:  Procedure Laterality Date  . BIOPSY  02/19/2018   Procedure: BIOPSY;  Surgeon: Rogene Houston, MD;  [facility];  Service: Endoscopy;;  rectum  . COLONOSCOPY N/A 08/20/2015   Procedure: COLONOSCOPY;  Surgeon: Rogene Houston, MD;  Location[facility];  Service: Endoscopy;  Laterality: N/A;  730  . [place] ENDOMETRIAL ABLATION  08-13-2004  . DIVERTING ILEOSTOMY N/A 12/07/2018   Procedure: DIVERTING LOOP ILEOSTOMY;  Surgeon: Leighton Ruff, MD;  Location: [facility] OR[facility];  Service: General;  Laterality: N/A;  . FLEXIBLE SIGMOIDOSCOPY N/A 02/19/2018   Procedure: FLEXIBLE SIGMOIDOSCOPY;  Surgeon: Rogene Houston, MD;  Location[facility];  Service: Endoscopy;  Laterality: N/A;  . HYSTEROSCOPY WITH D & C N/A 04/30/2015   Procedure: DILATATION AND CURETTAGE / INTENDED HYSTEROSCOPY;  Surgeon: Dian Queen, MD;  Location: [place];  Service: Gynecology;  Laterality: N/A;  . ILEOSTOMY CLOSURE N/A 04/17/2019   Procedure: LOOP ILEOSTOMY REVERSAL;  Surgeon: Leighton Ruff, MD;  Location:  [facility];  Service: General;  Laterality: N/A;  . IR US GUIDE BX ASP/DRAIN  03/09/2018  . LAPAROSCOPIC CHOLECYSTECTOMY  04/1996  . LAPAROSCOPY N/A 12/11/2018   Procedure: LAPAROSCOPY  ILEOSTOMY REVISION AND ABDOMINAL WASHOUT;  Surgeon: Leighton Ruff, MD;  Location[facility];  Service: General;  Laterality: N/A;  . Liver Microwave   10/17/2018  . POLYPECTOMY  08/20/2015   Procedure: POLYPECTOMY;  Surgeon: Rogene Houston, MD;  [facility];  Service: Endoscopy;;  Splenic flexure polypectomy  . POLYPECTOMY  02/19/2018   Procedure: POLYPECTOMY;  Surgeon: Rogene Houston, MD;  [facility];  Service: Endoscopy;;  rectum  . PORTACATH PLACEMENT N/A 03/14/2018   Procedure: INSERTION PORT-A-CATH;  Surgeon: Aviva Signs, MD;  Location: [facility];  Service: General;  Laterality: N/A;  . REDUCTION INCARCERATED UTERUS  06-20-2000   intrauterine preg. 13 wks /  urinary retention  . THYROID LOBECTOMY  11/24/2011   Procedure: THYROID LOBECTOMY;  Surgeon: Earnstine Regal, MD;  [facility];  Service: General;  Laterality: Left;  Left Thyroid Lobectomy  . TUBAL LIGATION  2002  . XI ROBOTIC ASSISTED LOWER ANTERIOR RESECTION N/A 12/07/2018   Procedure: XI ROBOTIC ASSISTED LOWER ANTERIOR RESECTION, SPENIC FLEXURE IMMOBILIZATION, COLOANAL ANASTOMOSIS, RIGID PROCTOSCOPY;  Surgeon: Leighton Ruff, MD;  [facility];  Service: General;  Laterality: N/A;    SOCIAL HISTORY:  Social History   Socioeconomic History  . Marital status: Married    Spouse name: Not on file  . Number of children: 4  . Years of education: Not on file  . Highest education level: Not on file  Occupational History    Comment: Scheduler at [employer]  . Smoking status: Former Smoker    Packs/day: 0.25    Years: 10.00    Pack years: 2.50    Types: Cigarettes    Quit date: 10/31/1991    Years since quitting: 28.6  . Smokeless tobacco: Never Used  Vaping Use  . Vaping Use: Never used  Substance and  Sexual Activity  . Alcohol use: No  . Drug use: No  . Sexual activity: Not Currently  Other Topics Concern  . Not on file  Social History Narrative   Lives with husband [name] and 74 year old son [name]   Husband is Interior and spatial designer of [employer]   Social Determinants of Health   Financial Resource Strain: Not on [employer] Insecurity: Not on file  Transportation Needs: Not on file  Physical Activity: Not on file  Stress: Not on file  Social Connections: Not on file  Intimate Partner Violence: Not on file    FAMILY HISTORY:  Family History  Problem Relation Age of Onset  . Hypertension Mother   . Hypertension Father   . Prostate cancer Father   . Cancer Maternal Aunt        spine/back  . Cancer Paternal Grandfather        lung  . Healthy Son   . Healthy Son   . Healthy Son   . Healthy Son   . Colon cancer Neg Hx     CURRENT MEDICATIONS:  Current Outpatient Medications  Medication Sig Dispense Refill  . ALPRAZolam (XANAX) 0.5 MG tablet TAKE ONE TABLET BY MOUTH 3 TIMES DAILY 90 tablet 2  . amitriptyline (ELAVIL) 10 MG tablet TAKE 1 TABLET (10 MG TOTAL) BY MOUTH IN THE MORNING AND AT BEDTIME. 60 tablet 2  . ARMOUR THYROID 60 MG tablet Take 60 mg by mouth every morning.    Francia Greaves THYROID 60 MG tablet TAKE 1 TABLET BY MOUTH ONCE DAILY. 90 tablet 1  . dicyclomine (BENTYL) 10 MG capsule TAKE 1 CAPSULE (10 MG TOTAL) BY MOUTH 3 (THREE) TIMES DAILY BEFORE MEALS. 90 capsule 5  . fluticasone (CUTIVATE) 0.05 % cream Apply 1 application topically daily as needed (irritatiton).    Marland Kitchen lidocaine-prilocaine (EMLA) cream Apply small amount to port site one hour prior to appointment and cover with plastic wrap. 30 g 3  . loperamide (IMODIUM) 2 MG capsule Take 1 capsule (2 mg total) by mouth 3 (three) times daily. 30 capsule 0  . mirtazapine (REMERON) 15 MG tablet Take 1 tablet (15 mg total) by mouth at bedtime. 30 tablet 3  . Multiple Vitamin (MULTI-VITAMIN) tablet Take by mouth.    .  ondansetron (ZOFRAN) 4 MG tablet Take 1 tablet (4 mg total) by mouth every 8 (eight) hours as needed for nausea or vomiting. 30 tablet 2  . OVER THE COUNTER MEDICATION Multivitamin w/iron    . prochlorperazine (COMPAZINE) 10 MG tablet Take 1 tablet (10 mg total) by mouth every 6 (six) hours as needed for nausea or vomiting. 60 tablet 3  . traMADol (ULTRAM) 50 MG tablet TAKE 1 TABLET (50 MG TOTAL) BY MOUTH EVERY 12 (TWELVE)  HOURS AS NEEDED FOR MODERATE PAIN OR SEVERE PAIN (CANCER PAIN) 30 tablet 0   No current facility-administered medications for this visit.   Facility-Administered Medications Ordered in Other Visits  Medication Dose Route Frequency Provider Last Rate Last Admin  . clindamycin (CLEOCIN) 900 mg in dextrose 5 % 50 mL IVPB  900 mg Intravenous 60 min Pre-Op Leighton Ruff, MD       And  . gentamicin (GARAMYCIN) 350 mg in dextrose 5 % 50 mL IVPB  5 mg/kg Intravenous 60 min Pre-Op Leighton Ruff, MD      . clindamycin (CLEOCIN) 900 mg in dextrose 5 % 50 mL IVPB  900 mg Intravenous 60 min Pre-Op Leighton Ruff, MD       And  . gentamicin (GARAMYCIN) 5 mg/kg in dextrose 5 % 50 mL IVPB  5 mg/kg Intravenous 60 min Pre-Op Leighton Ruff, MD      . sodium chloride 0.9 % 1,000 mL with potassium chloride 20 mEq, magnesium sulfate 2 g infusion   Intravenous Once Derek Jack, MD        ALLERGIES:  Allergies  Allergen Reactions  . Demerol [Meperidine] Itching    All over the body.  . Penicillins Hives     childhood, does not remember if it spread all over the body or not. Did it involve swelling of the face/tongue/throat, SOB, or low BP? No Did it involve sudden or severe rash/hives, skin peeling, or any reaction on the inside of your mouth or nose? Yes Did you need to seek medical attention at a hospital or doctor's office? Yes When did it last happen?childhood allergy If all above answers are "NO", may proceed with cephalosporin use.   . Other Hives and Other (See  Comments)    Fresh coconut   . Vancomycin Rash    Will need Benadryl prior to administration per IV. "Red man syndrome"    PHYSICAL EXAM:  Performance status (ECOG): 0 - Asymptomatic  Vitals:   07/02/20 1615  BP: 116/80  Pulse: 97  Resp: 18  Temp: 99 F (37.2 C)  SpO2: 100%   Wt Readings from Last 3 Encounters:  07/02/20 112 lb (50.8 kg)  04/06/20 102 lb 14.4 oz (46.7 kg)  03/09/20 103 lb 4 oz (46.8 kg)   Physical Exam Vitals reviewed.  Constitutional:      Appearance: Normal appearance.  Cardiovascular:     Rate and Rhythm: Normal rate and regular rhythm.     Pulses: Normal pulses.     Heart sounds: Normal heart sounds.  Pulmonary:     Effort: Pulmonary effort is normal.     Breath sounds: Normal breath sounds.  Abdominal:     Palpations: Abdomen is soft. There is no hepatomegaly, splenomegaly or mass.     Tenderness: There is abdominal tenderness in the right upper quadrant.     Hernia: No hernia is present.  Lymphadenopathy:     Lower Body: No right inguinal adenopathy. No left inguinal adenopathy.  Neurological:     General: No focal deficit present.     Mental Status: She is alert and oriented to person, place, and time.  Psychiatric:        Mood and Affect: Mood normal.        Behavior: Behavior normal.      LABORATORY DATA:  I have reviewed the labs as listed.  CBC Latest Ref Rng & Units 03/31/2020 03/06/2020 01/20/2020  WBC 4.0 - 10.5 K/uL 3.4(L) 3.7(L) 2.5(L)  Hemoglobin 12.0 -  15.0 g/dL 11.7(L) 11.3(L) 10.6(L)  Hematocrit 36.0 - 46.0 % 32.9(L) 32.1(L) 30.6(L)  Platelets 150 - 400 K/uL 94(L) 98(L) 75(L)   CMP Latest Ref Rng & Units 07/02/2020 03/31/2020 03/06/2020  Glucose 70 - 99 mg/dL 108(H) 95 108(H)  BUN 6 - 20 mg/dL $Remove'19 15 19  'WcnBpSt$ Creatinine 0.44 - 1.00 mg/dL 0.79 0.72 0.55  Sodium 135 - 145 mmol/L 137 138 135  Potassium 3.5 - 5.1 mmol/L 3.7 3.2(L) 3.4(L)  Chloride 98 - 111 mmol/L 104 104 102  CO2 22 - 32 mmol/L $RemoveB'24 24 25  'cvsPDYkN$ Calcium 8.9 - 10.3 mg/dL  8.7(L) 9.1 8.9  Total Protein 6.5 - 8.1 g/dL 7.3 7.1 6.8  Total Bilirubin 0.3 - 1.2 mg/dL 1.9(H) 2.8(H) 2.8(H)  Alkaline Phos 38 - 126 U/L 94 84 89  AST 15 - 41 U/L $Remo'21 22 26  'NyzSG$ ALT 0 - 44 U/L $Remo'12 13 15   'gsDIf$ Lab Results  Component Value Date   CEA1 0.7 03/31/2020   CEA1 0.5 01/20/2020   CEA1 2.1 10/18/2019    DIAGNOSTIC IMAGING:  I have independently reviewed the scans and discussed with the patient. CT CHEST ABDOMEN PELVIS W CONTRAST  Result Date: 07/02/2020 CLINICAL DATA:  Rectal cancer restaging, status post resection, chemo radiation EXAM: CT CHEST, ABDOMEN, AND PELVIS WITH CONTRAST TECHNIQUE: Multidetector CT imaging of the chest, abdomen and pelvis was performed following the standard protocol during bolus administration of intravenous contrast. CONTRAST:  127mL OMNIPAQUE IOHEXOL 300 MG/ML SOLN, additional oral enteric contrast COMPARISON:  04/01/2020 FINDINGS: CT CHEST FINDINGS Cardiovascular: Unchanged enlargement of the tubular ascending thoracic aorta, measuring 4.5 x 4.4 cm. Normal heart size. No pericardial effusion. Mediastinum/Nodes: No enlarged mediastinal, hilar, or axillary lymph nodes. Thyroid gland, trachea, and esophagus demonstrate no significant findings. Lungs/Pleura: Interval enlargement of a pulmonary nodule of the medial right lung apex measuring 0.9 x 0.7 cm, previously 0.7 x 0.5 cm (series 4, image 35). Interval increase in somewhat nodular consolidation of the dependent medial left lung base, measuring approximately 2.6 x 1.1 cm, previously ill-defined and difficult to measure (series 4, image 140). No pleural effusion or pneumothorax. Musculoskeletal: No chest wall mass or suspicious bone lesions identified. CT ABDOMEN PELVIS FINDINGS Hepatobiliary: Unchanged appearance of the liver, including an ablation site of the posterior right lobe, hepatic segment VI/VII (series 2, image 63) and soft tissue stranding about the porta hepatis with fiducial markers (series 2, image 66).  Status post cholecystectomy. No biliary dilatation. Pancreas: Unremarkable. No pancreatic ductal dilatation or surrounding inflammatory changes. Spleen: Normal in size without significant abnormality. Adrenals/Urinary Tract: Adrenal glands are unremarkable. Kidneys are normal, without renal calculi, solid lesion, or hydronephrosis. Bladder is unremarkable. Stomach/Bowel: Stomach is within normal limits. Appendix is not clearly visualized and may be surgically absent. No evidence of bowel wall thickening, distention, or inflammatory changes. Status post low anterior resection and reanastomosis with unchanged post treatment appearance of the pelvis, including presacral soft tissue thickening and fat stranding (series 2, image 125, 113). Additional evidence of prior distal small bowel resection and reanastomosis related to prior diverting ileostomy. Vascular/Lymphatic: Aortic atherosclerosis. No enlarged abdominal or pelvic lymph nodes. Reproductive: No mass or other abnormality. Other: No abdominal wall hernia or abnormality. No abdominopelvic ascites. Musculoskeletal: No acute or significant osseous findings. IMPRESSION: 1. Interval enlargement of a pulmonary nodule of the medial right lung apex measuring 0.9 x 0.7 cm, previously 0.7 x 0.5 cm, highly suspicious for pulmonary metastatic disease. 2. Interval increase in somewhat nodular consolidation of the dependent  medial left lung base, measuring approximately 2.6 x 1.1 cm, previously ill-defined and difficult to measure. Continued enlargement and increasing density of consolidation in this vicinity is highly suspicious for an underlying metastatic nodule. 3. Unchanged appearance of the liver, including an ablation site of the posterior right lobe, hepatic segment VI/VII and soft tissue stranding about the porta hepatis with fiducial markers in keeping with radiation therapy. 4. Status post low anterior resection and reanastomosis with unchanged post treatment  appearance of the pelvis, including presacral soft tissue thickening and fat stranding. Additional evidence of prior distal small bowel resection and reanastomosis related to prior diverting ileostomy. 5. Unchanged enlargement of the tubular ascending thoracic aorta, measuring 4.5 x 4.4 cm. Ascending thoracic aortic aneurysm. Recommend semi-annual imaging followup by CTA or MRA if not otherwise imaged and referral to cardiothoracic surgery if not already obtained. This recommendation follows 2010 ACCF/AHA/AATS/ACR/ASA/SCA/SCAI/SIR/STS/SVM Guidelines for the Diagnosis and Management of Patients With Thoracic Aortic Disease. Circulation. 2010; 121: F027-X412. Aortic aneurysm NOS (ICD10-I71.9) These results will be called to the ordering clinician or representative by the Radiologist Assistant, and communication documented in the PACS or [employer]. Aortic Atherosclerosis (ICD10-I70.0). Electronically Signed   By: Eddie Candle M.D.   On: 07/02/2020 12:13     ASSESSMENT:  1. 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 Liver biopsy on 03/09/2018 consistent with adenocarcinoma. 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 -7 cycles of FOLFOX with vectibix completed on 06/27/2018. -XRT with Xeloda from 07/30/2018 through 09/06/2018. -Right liver lesion microwave ablation on 10/15/2018 at [facility]. -Low anterior resection and diverting ileostomy on 12/07/2018, pathology [id number], 0/6 lymph nodes positive, margins negative. -Loop ileostomy reversal on 04/17/2019. -CTAP on 07/10/2019 showed left lower lobe pulmonary nodule increased by 1 mm, measuring 8 mm. 3 mm left upper lobe lung nodule is unchanged. Probable 4 mm right apical pulmonary nodule felt to be enlarged from 2 mm from October 2020. Vague hypoattenuation within the posterior right hepatic lobe measures 1.5 cm, measuring 1.2 cm in October 2020. Subtle subcapsular right  hepatic lobe hypoattenuating 1 cm lesion was not seen on prior scans. -We reviewed results of MRI of the abdomen with and without contrast from 07/16/2019. 2 enhancing lesions in the right hepatic lobe, largest one measuring 1.5 x 1.0 cm. Inferior lesion is measured as 1 cm. Stable splenomegaly. -CEA is 2.4. -Microwave ablation of the right hepatic lobe lesion by Dr. Maudie Mercury around 08/15/2019. -SBRT to the left lower lobe lung lesion and left liver lobe lesion on 12/13/2019 at [facility]. -CT CAP from 01/22/2020 showed interval enlargement of medial right pulmonary nodule measuring about 7 mm, previously 5 mm. Evaluation of the new lesion in the left lobe of the liver is limited by metallic fiducial marking clips although lesion appears to be diminished. Low attenuation ablation sites in the right lobe of the liver unchanged. Left lower lobe lung lesion also decreased in size. - CT CAP on 04/01/2020 showed stable 7 mm right upper lobe lung nodule.  Left lower lobe nodule/scarring is stable.  2 ablation defects in the posterior and inferior right hepatic lobe are stable in appearance without evidence of residual contrast-enhancement.  2 cm cystic lesion in the anterior aspect of the spleen is stable.  Stable mild splenomegaly.  2. Hyperbilirubinemia: -Total bilirubin is 5.1. She has Gilbert's syndrome. Baseline is between 2 and 3.  3. Hypothyroidism: -She is on Armour Thyroid. TSH is 3.6.  4. Weight loss: -She  had 10 pound weight loss since her last visit 3 months ago. However she had ileostomy reversal and is adjusting to new food habits.   PLAN:  1. Stage IV rectal adenocarcinoma: -We have reviewed CT images and compared with the prior scans.  Current CT scan on 07/01/2020 shows right upper lobe lung nodule measuring 0.9 x 0.7 cm, previously 0.7 x 0.5 cm.  I have recommended SBRT to the right upper lobe lung nodule.  We will make a referral back to Dr. Lisbeth Renshaw. -There is also interval  increase in consolidation in the left lung base measuring 2.6 x 1.1 cm.  I believe this lesion was previously irradiated and think these are postradiation changes.  But we will see what Dr. Lisbeth Renshaw would recommend about this area. -Liver lesions have been stable.  No other new areas were seen.  Physical examination reveals mild tender area in the right upper quadrant without any palpable mass.  Spleen tip palpable in the left upper quadrant. -I plan to repeat CT CAP with CEA level in 4 months.  If everything is stable at that point, we will switch to every 6 months.  2. Hyperbilirubinemia: -She has Gilbert's syndrome.  Total bilirubin is 1.9.  3. Weight loss: -She is starting to gain weight.  Her weight improved to 112. -Her abdominal pain also improved.  We will cut back on Remeron to 15 mg at bedtime. -She would like to gain her weight up to 125 pounds.  Then we  will discontinue Remeron completely.  4. Hypothyroidism: -Continue Armour Thyroid managed by her endocrinologist.  5. Leukopenia and thrombocytopenia: -She has mild leukopenia and thrombocytopenia, likely from splenomegaly.  No intervention necessary.   Orders placed this encounter:  No orders of the defined types were placed in this encounter.    Derek Jack, MD [place] [phone]   I, Milinda Antis, am acting as a scribe for Dr. Sanda Linger.  I, Derek Jack MD, have reviewed the above documentation for accuracy and completeness, and I agree with the above.

## 2020-07-03 ENCOUNTER — Other Ambulatory Visit (HOSPITAL_COMMUNITY): Payer: Self-pay

## 2020-07-03 LAB — CEA: CEA: 1 ng/mL (ref 0.0–4.7)

## 2020-07-06 ENCOUNTER — Ambulatory Visit (HOSPITAL_COMMUNITY): Payer: 59 | Admitting: Hematology

## 2020-07-09 ENCOUNTER — Encounter (HOSPITAL_COMMUNITY): Payer: 59 | Admitting: Dietician

## 2020-07-13 ENCOUNTER — Encounter (HOSPITAL_COMMUNITY): Payer: 59 | Admitting: Dietician

## 2020-07-14 ENCOUNTER — Other Ambulatory Visit (HOSPITAL_COMMUNITY): Payer: Self-pay

## 2020-07-14 ENCOUNTER — Other Ambulatory Visit (INDEPENDENT_AMBULATORY_CARE_PROVIDER_SITE_OTHER): Payer: Self-pay | Admitting: Internal Medicine

## 2020-07-14 MED ORDER — AMITRIPTYLINE HCL 10 MG PO TABS
10.0000 mg | ORAL_TABLET | Freq: Two times a day (BID) | ORAL | 1 refills | Status: DC
  Filled 2020-07-14: qty 180, 90d supply, fill #0
  Filled 2020-12-07: qty 180, 90d supply, fill #1

## 2020-07-14 NOTE — Telephone Encounter (Signed)
Prescription for amitriptyline 10 mg p.o. twice daily sent to patient's pharmacy for 90 days with 1 refill

## 2020-07-15 ENCOUNTER — Encounter: Payer: Self-pay | Admitting: Radiation Oncology

## 2020-07-16 ENCOUNTER — Ambulatory Visit
Admission: RE | Admit: 2020-07-16 | Discharge: 2020-07-16 | Disposition: A | Discharge status: Home / Self Care | Payer: 59 | Source: Ambulatory Visit | Attending: Radiation Oncology | Admitting: Radiation Oncology

## 2020-07-16 ENCOUNTER — Other Ambulatory Visit: Payer: Self-pay

## 2020-07-16 DIAGNOSIS — C20 Malignant neoplasm of rectum: Secondary | ICD-10-CM

## 2020-07-16 DIAGNOSIS — C7801 Secondary malignant neoplasm of right lung: Secondary | ICD-10-CM | POA: Diagnosis not present

## 2020-07-16 DIAGNOSIS — C787 Secondary malignant neoplasm of liver and intrahepatic bile duct: Secondary | ICD-10-CM

## 2020-07-16 NOTE — Progress Notes (Signed)
Radiation Oncology         [phone] ________________________________  Outpatient Reconsult - Conducted via telephone due to current COVID-19 concerns for limiting patient exposure  I spoke with the patient to conduct this consult visit via telephone to spare the patient unnecessary potential exposure in the healthcare setting during the current COVID-19 pandemic. The patient was notified in advance and was offered a [place] meeting to allow for face to face communication but unfortunately reported that they did not have the appropriate resources/technology to support such a visit and instead preferred to proceed with a telephone visit.  Name: Samantha Reynolds MRN: [id number]  Date of Service: 07/16/2020  DOB: 04/15/1965  Reconsultation  Diagnosis:  Progressive Metastatic Adenocarcinoma of the rectum.  Interval Since Last Radiation:  7 months  12/04/2019-12/13/2019 SBRT Treatment: The patient was treated to the liver with a course of stereotactic body radiation treatment.  The patient received 60 Gray in 5 fractions using a SBRT/IMRT technique, with 3 fields.  11/15/19 - 11/22/19 SBRT Treatment: The patient was treated to the left lower lobe with a course of stereotactic body radiation treatment.  The patient received 54 Gray in 3 fractions using a SBRT/IMRT technique, with 3 fields.  07/30/2018-09/06/2018: The rectum was treated to 45 Gy in 25 fractions, and 5.4 Gy boost in 3 fractions  Narrative:  The patient is a delightful 55 y.o. 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 Given her good performance status, she received total neoadjuvant chemotherapy and completed this in April 2020 followed by chemoRT in the spring of 2020. She subsequently underwent robotic LAR with loop ileostomy in September 2020 with 2 cm of residual disease but negative nodes and margins. She had reversal of the ileostomy in January 2021, also microwave ablation to a right hepatic lesion  by Dr. Maudie Mercury at [facility] in May 2021.She had another lesion that was difficult to access for ablation. In the interim of additional imaging, a lesion in the left lung was noted and treated with SBRT in August 2021. She also was able to undergo SBRT to the liver lesion in September 2021.  Since her last visit, the patient has been able to be followed in surveillance, and a CT of the chest abdomen and pelvis in October 2021 showed 7 mm pulmonary nodule in the medial right lung previously 5 mm, repeat CT on 04/01/2020 showed stable right upper lobe nodule measuring 7 mm and otherwise stable changes throughout the liver and spleen.  Repeat CT on 07/01/2020 shows the right upper lobe nodule now measuring 9 x 7 mm and given the changes she has been referred back to Dr. Lisbeth Renshaw to consider stereotactic body radiotherapy (SBRT) to the site. There was also a description of the LLL nodule that was treated Otherwise the plan is for her to continue in surveillance with Dr. Delton Coombes with repeat restaging imaging and CEA testing.   On review of systems, the patient is doing fairly well. Her main concern was that she lost a lot of weight in the last few years. She was 193# in July 2019, 140# in October 2020, and 116# on her last day of SBRT to the lung on 11/22/19. She has since lost down to 102# in January 2022, and now is back to 112# with the aid of Remeron. She describes a poor appetite. She denies any shortness of breath or chest pain. No other complaints are verbalized.  PAST MEDICAL HISTORY:  Past Medical History:  Diagnosis Date  . Anxiety   . Cancer [facility]) 2013   thyroid  . Endometrial polyp   . Endometrial thickening on ultra sound   . GERD (gastroesophageal reflux disease)   . 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 H/O Hashimoto thyroiditis   . History of chemotherapy   . History of radiation therapy   . History of thyroid cancer no recurrence   2013--  s/p  left lobe  thyroidectomy--  follicular varient papillary / lymphocyctic thyroiditis  . Hyperlipidemia 11/09/2015  . Hypothyroidism   . 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[place]) 02/26/2018    PAST SURGICAL HISTORY: Past Surgical History:  Procedure Laterality Date  . BIOPSY  02/19/2018   Procedure: BIOPSY;  Surgeon: Rogene Houston, MD;  [facility];  Service: Endoscopy;;  rectum  . COLONOSCOPY N/A 08/20/2015   Procedure: COLONOSCOPY;  Surgeon: Rogene Houston, MD;  [facility];  Service: Endoscopy;  Laterality: N/A;  730  . [place] ENDOMETRIAL ABLATION  08-13-2004  . DIVERTING ILEOSTOMY N/A 12/07/2018   Procedure: DIVERTING LOOP ILEOSTOMY;  Surgeon: Leighton Ruff, MD;  Location: [facility];  Service: General;  Laterality: N/A;  . FLEXIBLE SIGMOIDOSCOPY N/A 02/19/2018   Procedure: FLEXIBLE SIGMOIDOSCOPY;  Surgeon: Rogene Houston, MD;  [facility];  Service: Endoscopy;  Laterality: N/A;  . HYSTEROSCOPY WITH D & C N/A 04/30/2015   Procedure: DILATATION AND CURETTAGE / INTENDED HYSTEROSCOPY;  Surgeon: Dian Queen, MD;  Location: [facility];  Service: Gynecology;  Laterality: N/A;  . ILEOSTOMY CLOSURE N/A 04/17/2019   Procedure: LOOP ILEOSTOMY REVERSAL;  Surgeon: Leighton Ruff, MD;  [facility];  Service: General;  Laterality: N/A;  . IR US GUIDE BX ASP/DRAIN  03/09/2018  . LAPAROSCOPIC CHOLECYSTECTOMY  04/1996  . LAPAROSCOPY N/A 12/11/2018   Procedure: LAPAROSCOPY  ILEOSTOMY REVISION AND ABDOMINAL WASHOUT;  Surgeon: Leighton Ruff, MD;  [facility];  Service: General;  Laterality: N/A;  . Liver Microwave   10/17/2018  . POLYPECTOMY  08/20/2015   Procedure: POLYPECTOMY;  Surgeon: Rogene Houston, MD;  [facility];  Service: Endoscopy;;  Splenic flexure polypectomy  . POLYPECTOMY  02/19/2018   Procedure: POLYPECTOMY;  Surgeon: Rogene Houston, MD;  [facility];  Service: Endoscopy;;  rectum  .  PORTACATH PLACEMENT N/A 03/14/2018   Procedure: INSERTION PORT-A-CATH;  Surgeon: Aviva Signs, MD;  Location: [facility];  Service: General;  Laterality: N/A;  . REDUCTION INCARCERATED UTERUS  06-20-2000   intrauterine preg. 13 wks /  urinary retention  . THYROID LOBECTOMY  11/24/2011   Procedure: THYROID LOBECTOMY;  Surgeon: Earnstine Regal, MD;  [facility];  Service: General;  Laterality: Left;  Left Thyroid Lobectomy  . TUBAL LIGATION  2002  . XI ROBOTIC ASSISTED LOWER ANTERIOR RESECTION N/A 12/07/2018   Procedure: XI ROBOTIC ASSISTED LOWER ANTERIOR RESECTION, SPENIC FLEXURE IMMOBILIZATION, COLOANAL ANASTOMOSIS, RIGID PROCTOSCOPY;  Surgeon: Leighton Ruff, MD;  [facility];  Service: General;  Laterality: N/A;    PAST SOCIAL HISTORY:  Social History   Socioeconomic History  . Marital status: Married    Spouse name: Not on file  . Number of children: 4  . Years of education: Not on file  . 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 Smoking status: Former Smoker  Packs/day: 0.25    Years: 10.00    Pack years: 2.50    Types: Cigarettes    Quit date: 10/31/1991    Years since quitting: 28.7  . Smokeless tobacco: Never Used  Vaping Use  . Vaping Use: Never used  Substance and Sexual Activity  . Alcohol use: No  . Drug use: No  . Sexual activity: Not Currently  Other Topics Concern  . Not on file  Social History Narrative   Lives with husband [name] and 59 year old son [name]   Husband is Interior and spatial designer of [employer]   Social Determinants of Health   Financial Resource Strain: Not on [employer] Insecurity: Not on file  Transportation Needs: Not on file  Physical Activity: Not on file  Stress: Not on file  Social Connections: Not on file  Intimate Partner Violence: Not on file    PAST FAMILY HISTORY: Family History  Problem Relation Age of Onset  . Hypertension Mother   . Hypertension Father    . Prostate cancer Father   . Cancer Maternal Aunt        spine/back  . Cancer Paternal Grandfather        lung  . Healthy Son   . Healthy Son   . Healthy Son   . Healthy Son   . Colon cancer Neg Hx     MEDICATIONS  Current Outpatient Medications  Medication Sig Dispense Refill  . ALPRAZolam (XANAX) 0.5 MG tablet TAKE ONE TABLET BY MOUTH 3 TIMES DAILY 90 tablet 2  . amitriptyline (ELAVIL) 10 MG tablet Take 1 tablet (10 mg total) by mouth 2 (two) times daily. 180 tablet 1  . ARMOUR THYROID 60 MG tablet Take 60 mg by mouth every morning.    Francia Greaves THYROID 60 MG tablet TAKE 1 TABLET BY MOUTH ONCE DAILY. 90 tablet 1  . loperamide (IMODIUM) 2 MG capsule Take 1 capsule (2 mg total) by mouth 3 (three) times daily. 30 capsule 0  . mirtazapine (REMERON) 15 MG tablet Take 1 tablet (15 mg total) by mouth at bedtime. 30 tablet 3  . OVER THE COUNTER MEDICATION Multivitamin w/iron    . prochlorperazine (COMPAZINE) 10 MG tablet Take 1 tablet (10 mg total) by mouth every 6 (six) hours as needed for nausea or vomiting. 60 tablet 3  . dicyclomine (BENTYL) 10 MG capsule TAKE 1 CAPSULE (10 MG TOTAL) BY MOUTH 3 (THREE) TIMES DAILY BEFORE MEALS. (Patient not taking: Reported on 07/15/2020) 90 capsule 5  . fluticasone (CUTIVATE) 0.05 % cream Apply 1 application topically daily as needed (irritatiton). (Patient not taking: Reported on 07/15/2020)    . lidocaine-prilocaine (EMLA) cream Apply small amount to port site one hour prior to appointment and cover with plastic wrap. (Patient not taking: Reported on 07/15/2020) 30 g 3  . Multiple Vitamin (MULTI-VITAMIN) tablet Take by mouth. (Patient not taking: Reported on 07/15/2020)    . ondansetron (ZOFRAN) 4 MG tablet Take 1 tablet (4 mg total) by mouth every 8 (eight) hours as needed for nausea or vomiting. (Patient not taking: Reported on 07/15/2020) 30 tablet 2  . traMADol (ULTRAM) 50 MG tablet TAKE 1 TABLET (50 MG TOTAL) BY MOUTH EVERY 12 (TWELVE) HOURS AS NEEDED  FOR MODERATE PAIN OR SEVERE PAIN (CANCER PAIN) (Patient not taking: Reported on 07/15/2020) 30 tablet 0   No current facility-administered medications for this encounter.   Facility-Administered Medications Ordered in Other Encounters  Medication Dose Route Frequency Provider Last Rate  Last Admin  . clindamycin (CLEOCIN) 900 mg in dextrose 5 % 50 mL IVPB  900 mg Intravenous 60 min Pre-Op Leighton Ruff, MD       And  . gentamicin (GARAMYCIN) 350 mg in dextrose 5 % 50 mL IVPB  5 mg/kg Intravenous 60 min Pre-Op Leighton Ruff, MD      . clindamycin (CLEOCIN) 900 mg in dextrose 5 % 50 mL IVPB  900 mg Intravenous 60 min Pre-Op Leighton Ruff, MD       And  . gentamicin (GARAMYCIN) 5 mg/kg in dextrose 5 % 50 mL IVPB  5 mg/kg Intravenous 60 min Pre-Op Leighton Ruff, MD      . sodium chloride 0.9 % 1,000 mL with potassium chloride 20 mEq, magnesium sulfate 2 g infusion   Intravenous Once Derek Jack, MD        ALLERGIES:  Allergies  Allergen Reactions  . Demerol [Meperidine] Itching    All over the body.  . Penicillins Hives     childhood, does not remember if it spread all over the body or not. Did it involve swelling of the face/tongue/throat, SOB, or low BP? No Did it involve sudden or severe rash/hives, skin peeling, or any reaction on the inside of your mouth or nose? Yes Did you need to seek medical attention at a hospital or doctor's office? Yes When did it last happen?childhood allergy If all above answers are "NO", may proceed with cephalosporin use.   . Other Hives and Other (See Comments)    Fresh coconut   . Vancomycin Rash    Will need Benadryl prior to administration per IV. "Red man syndrome"   Physical Exam: Wt Readings from Last 3 Encounters:  07/02/20 112 lb (50.8 kg)  04/06/20 102 lb 14.4 oz (46.7 kg)  03/09/20 103 lb 4 oz (46.8 kg)   Temp Readings from Last 3 Encounters:  07/02/20 99 F (37.2 C) (Oral)  04/06/20 (!) 97.5 F (36.4 C) (Oral)   03/09/20 97.9 F (36.6 C) (Oral)   BP Readings from Last 3 Encounters:  07/02/20 116/80  04/06/20 117/71  03/09/20 108/80   Pulse Readings from Last 3 Encounters:  07/02/20 97  04/06/20 99  03/09/20 64   Unable to assess due to encounter type   Impression/Plan: 1. Stage IV, [id number] adenocarcinoma of the rectum. Dr. Lisbeth Renshaw discusses the patient's recent imaging and scans to date. He discusses the lesion in the RUL appears to be a good target for stereotactic body radiotherapy (SBRT). She is interested in aggressive treatment modalities for her care.  We discussed the risks, benefits, short, and long term effects of radiotherapy, as well as the curative intent, and the patient is interested in proceeding. Dr. Lisbeth Renshaw discusses the delivery and logistics of radiotherapy and anticipates a course of 3-5 fractions of radiotherapy.  She will come on 07/27/20 for simulation at which time she will sign written consent to proceed.  2.  LLL nodule. This site was previously treated with radiation in August 2021. Dr. Lisbeth Renshaw will plan to review her next restaging scans due in August 2022, but the findings are consistent with prior radiotherapy change. 3. Weight loss. Unfortunately the patient has lost almost half of her entire body weight at the time of presentation. This seems to be multifactorial and slowly over time but the direct correlation to radiotherapy to the timing of her liver treatment does not appear to be the primary feature of her weight loss over time. We appreciate Dr. Tomie China  impression as well, and will follow this closely but expectantly as she has made some progress with appetite stimulation from Remeron.    Given current concerns for patient exposure during the COVID-19 pandemic, this encounter was conducted via telephone.  The patient has provided two factor identification and has given verbal consent for this type of encounter and has been advised to only accept a meeting of this  type in a secure network environment. The time spent during this encounter was 45 minutes including preparation, discussion, and coordination of the patient's care. The attendants for this meeting include  Dr. Lisbeth Renshaw, Hayden Pedro  and Josue Hector and Guido Sander. During the encounter,  Dr. Lisbeth Renshaw, and Hayden Pedro were located at [facility] Radiation Oncology Department.  Samantha Reynolds was located at home with her husband [name].      Carola Rhine, PAC

## 2020-07-20 ENCOUNTER — Ambulatory Visit (HOSPITAL_COMMUNITY): Payer: 59 | Admitting: Dietician

## 2020-07-20 NOTE — Progress Notes (Signed)
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 Patient is not currently in treatment. She is pending start of SBRT for RUL nodule and is scheduled for simulation on 5/2.  Spoke with patient via telephone today. She reports good appetite, did not feel higher dose Remeron was increasing appetite more so than previous dosage, reduced to 15 mg at last MD office visit. She has good and bad days with intake. Patient reports some days she tolerates well, but other days she will be in the bathroom for a couple of hours. Patient reports if she has "issues" it will happen after first meal of day. Patient reports trying to make appointments for late in the afternoon due to this. She attended her grandson's dedication at church over the weekend so she did not eat anything until later in the day which "she paid for" Patient reports episode of diarrhea after not eating for prolonged period of time. Patient tried apples without the peel and is enjoying them with peanut butter. She reports making a peach cobbler last week, had a few bites of the peaches and tolerated well.   Medications: reviewed  Labs: reviewed   Anthropometrics: Pt weight 112 lb on 4/7 increased from 108 on 2/25   102 lb 14.4 oz on 1/10   NUTRITION DIAGNOSIS: Inadequate oral intake improving   INTERVENTION:  Encouraged patient to continue trying new foods Patient to continue increasing calories and protein to promote weight gain Educated on nutrition shakes made by [employer] Continue taking appetite stimulant    MONITORING, EVALUATION, GOAL: weight trends, intake   NEXT VISIT: Thursday May 25 via telephone

## 2020-07-21 MED FILL — Alprazolam Tab 0.5 MG: ORAL | 30 days supply | Qty: 90 | Fill #0 | Status: AC

## 2020-07-22 ENCOUNTER — Other Ambulatory Visit (HOSPITAL_COMMUNITY): Payer: Self-pay

## 2020-07-27 ENCOUNTER — Ambulatory Visit
Admission: RE | Admit: 2020-07-27 | Discharge: 2020-07-27 | Disposition: A | Discharge status: Home / Self Care | Payer: 59 | Source: Ambulatory Visit | Attending: Radiation Oncology | Admitting: Radiation Oncology

## 2020-07-27 ENCOUNTER — Other Ambulatory Visit: Payer: Self-pay

## 2020-07-27 DIAGNOSIS — C7801 Secondary malignant neoplasm of right lung: Secondary | ICD-10-CM | POA: Diagnosis not present

## 2020-07-27 DIAGNOSIS — Z510 Encounter for antineoplastic radiation therapy: Secondary | ICD-10-CM | POA: Diagnosis not present

## 2020-07-27 DIAGNOSIS — C787 Secondary malignant neoplasm of liver and intrahepatic bile duct: Secondary | ICD-10-CM | POA: Diagnosis not present

## 2020-07-27 DIAGNOSIS — C7800 Secondary malignant neoplasm of unspecified lung: Secondary | ICD-10-CM | POA: Insufficient documentation

## 2020-07-27 DIAGNOSIS — C20 Malignant neoplasm of rectum: Secondary | ICD-10-CM | POA: Insufficient documentation

## 2020-07-31 DIAGNOSIS — C7800 Secondary malignant neoplasm of unspecified lung: Secondary | ICD-10-CM | POA: Diagnosis not present

## 2020-07-31 DIAGNOSIS — Z510 Encounter for antineoplastic radiation therapy: Secondary | ICD-10-CM | POA: Diagnosis not present

## 2020-07-31 DIAGNOSIS — C20 Malignant neoplasm of rectum: Secondary | ICD-10-CM | POA: Diagnosis not present

## 2020-07-31 DIAGNOSIS — C7801 Secondary malignant neoplasm of right lung: Secondary | ICD-10-CM | POA: Diagnosis not present

## 2020-07-31 DIAGNOSIS — C787 Secondary malignant neoplasm of liver and intrahepatic bile duct: Secondary | ICD-10-CM | POA: Diagnosis not present

## 2020-08-04 ENCOUNTER — Ambulatory Visit: Payer: 59 | Admitting: Radiation Oncology

## 2020-08-05 ENCOUNTER — Ambulatory Visit: Payer: 59 | Admitting: Radiation Oncology

## 2020-08-06 ENCOUNTER — Other Ambulatory Visit: Payer: Self-pay

## 2020-08-06 ENCOUNTER — Ambulatory Visit
Admission: RE | Admit: 2020-08-06 | Discharge: 2020-08-06 | Disposition: A | Discharge status: Home / Self Care | Payer: 59 | Source: Ambulatory Visit | Attending: Radiation Oncology | Admitting: Radiation Oncology

## 2020-08-06 DIAGNOSIS — Z510 Encounter for antineoplastic radiation therapy: Secondary | ICD-10-CM | POA: Diagnosis not present

## 2020-08-06 DIAGNOSIS — C20 Malignant neoplasm of rectum: Secondary | ICD-10-CM | POA: Diagnosis not present

## 2020-08-06 DIAGNOSIS — C787 Secondary malignant neoplasm of liver and intrahepatic bile duct: Secondary | ICD-10-CM | POA: Diagnosis not present

## 2020-08-06 DIAGNOSIS — C7800 Secondary malignant neoplasm of unspecified lung: Secondary | ICD-10-CM | POA: Diagnosis not present

## 2020-08-07 ENCOUNTER — Other Ambulatory Visit (HOSPITAL_COMMUNITY): Payer: Self-pay

## 2020-08-07 ENCOUNTER — Ambulatory Visit: Payer: 59 | Admitting: Radiation Oncology

## 2020-08-10 ENCOUNTER — Encounter: Payer: Self-pay | Admitting: Family Medicine

## 2020-08-10 ENCOUNTER — Ambulatory Visit: Payer: 59 | Admitting: Radiation Oncology

## 2020-08-11 ENCOUNTER — Encounter (HOSPITAL_COMMUNITY): Payer: 59

## 2020-08-11 ENCOUNTER — Ambulatory Visit
Admission: RE | Admit: 2020-08-11 | Discharge: 2020-08-11 | Disposition: A | Discharge status: Home / Self Care | Payer: 59 | Source: Ambulatory Visit | Attending: Radiation Oncology | Admitting: Radiation Oncology

## 2020-08-11 ENCOUNTER — Other Ambulatory Visit: Payer: Self-pay

## 2020-08-11 DIAGNOSIS — C787 Secondary malignant neoplasm of liver and intrahepatic bile duct: Secondary | ICD-10-CM | POA: Diagnosis not present

## 2020-08-11 DIAGNOSIS — C7800 Secondary malignant neoplasm of unspecified lung: Secondary | ICD-10-CM | POA: Diagnosis not present

## 2020-08-11 DIAGNOSIS — Z510 Encounter for antineoplastic radiation therapy: Secondary | ICD-10-CM | POA: Diagnosis not present

## 2020-08-11 DIAGNOSIS — C20 Malignant neoplasm of rectum: Secondary | ICD-10-CM | POA: Diagnosis not present

## 2020-08-12 ENCOUNTER — Ambulatory Visit: Payer: 59 | Admitting: Radiation Oncology

## 2020-08-13 ENCOUNTER — Other Ambulatory Visit: Payer: Self-pay

## 2020-08-13 ENCOUNTER — Ambulatory Visit
Admission: RE | Admit: 2020-08-13 | Discharge: 2020-08-13 | Disposition: A | Discharge status: Home / Self Care | Payer: 59 | Source: Ambulatory Visit | Attending: Radiation Oncology | Admitting: Radiation Oncology

## 2020-08-13 DIAGNOSIS — C787 Secondary malignant neoplasm of liver and intrahepatic bile duct: Secondary | ICD-10-CM | POA: Diagnosis not present

## 2020-08-13 DIAGNOSIS — C7800 Secondary malignant neoplasm of unspecified lung: Secondary | ICD-10-CM | POA: Diagnosis not present

## 2020-08-13 DIAGNOSIS — C20 Malignant neoplasm of rectum: Secondary | ICD-10-CM | POA: Diagnosis not present

## 2020-08-13 DIAGNOSIS — Z510 Encounter for antineoplastic radiation therapy: Secondary | ICD-10-CM | POA: Diagnosis not present

## 2020-08-13 DIAGNOSIS — C7801 Secondary malignant neoplasm of right lung: Secondary | ICD-10-CM | POA: Diagnosis not present

## 2020-08-13 NOTE — Progress Notes (Signed)
08/06/20-08/13/20   Radiation Oncology         [phone] ________________________________  Name: Samantha Reynolds MRN: [id number]  Date: 08/13/2020  DOB: 1965-11-28  End of Treatment Note  Diagnosis:   Non-small cell lung cancer     Indication for treatment:  Curative       Radiation treatment dates:   08/06/20-08/13/20  Site/dose:   The tumor in the RUL was treated with a course of stereotactic body radiation treatment. The patient received 54 Gy In 3 fractions at 18 G per fraction.  Narrative: The patient tolerated radiation treatment relatively well.   The patient did not have any signs of acute toxicity during treatment.   Plan: The patient has completed radiation treatment. The patient will receive a call in about one month from the radiation oncology department. She will continue follow up with Dr. Benay Spice as well.  I advised the patient to call or return sooner if they have any questions or concerns related to their recovery or treatment.   ------------------------------------------------    Carola Rhine, PAC

## 2020-08-14 ENCOUNTER — Ambulatory Visit: Payer: 59 | Admitting: Radiation Oncology

## 2020-08-20 ENCOUNTER — Encounter (HOSPITAL_COMMUNITY)
Admission: RE | Admit: 2020-08-20 | Discharge: 2020-08-20 | Disposition: A | Discharge status: Home / Self Care | Payer: 59 | Source: Ambulatory Visit | Attending: General Surgery | Admitting: General Surgery

## 2020-08-20 ENCOUNTER — Encounter (HOSPITAL_COMMUNITY): Payer: Self-pay

## 2020-08-20 ENCOUNTER — Other Ambulatory Visit: Payer: Self-pay

## 2020-08-20 ENCOUNTER — Ambulatory Visit (HOSPITAL_COMMUNITY): Payer: 59 | Admitting: Dietician

## 2020-08-20 DIAGNOSIS — C787 Secondary malignant neoplasm of liver and intrahepatic bile duct: Secondary | ICD-10-CM | POA: Diagnosis not present

## 2020-08-20 DIAGNOSIS — D511 Vitamin B12 deficiency anemia due to selective vitamin B12 malabsorption with proteinuria: Secondary | ICD-10-CM | POA: Diagnosis not present

## 2020-08-20 DIAGNOSIS — Z452 Encounter for adjustment and management of vascular access device: Secondary | ICD-10-CM | POA: Diagnosis not present

## 2020-08-20 DIAGNOSIS — C73 Malignant neoplasm of thyroid gland: Secondary | ICD-10-CM | POA: Diagnosis not present

## 2020-08-20 DIAGNOSIS — E890 Postprocedural hypothyroidism: Secondary | ICD-10-CM | POA: Diagnosis not present

## 2020-08-20 DIAGNOSIS — E063 Autoimmune thyroiditis: Secondary | ICD-10-CM | POA: Diagnosis not present

## 2020-08-20 DIAGNOSIS — C20 Malignant neoplasm of rectum: Secondary | ICD-10-CM | POA: Insufficient documentation

## 2020-08-20 DIAGNOSIS — E860 Dehydration: Secondary | ICD-10-CM | POA: Diagnosis not present

## 2020-08-20 MED ORDER — HEPARIN SOD (PORK) LOCK FLUSH 100 UNIT/ML IV SOLN
500.0000 [IU] | INTRAVENOUS | Status: AC | PRN
  Administered 2020-08-20: 500 [IU]

## 2020-08-20 MED ORDER — SODIUM CHLORIDE 0.9% FLUSH: 10.0000 mL | INTRAVENOUS | Status: DC | PRN

## 2020-08-20 NOTE — Progress Notes (Signed)
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 Patient is currently not in treatment. She completed SBRT 5/19 for RUL.   Spoke with patient via telephone. She reports completing radiation therapy last Thursday. Patient reports tolerating well other than having fatigue for ~2 days after treatments. She has been trying new foods recently. Reports eating a country ham biscuit from [employer] the other day, which she "paid for" and feels this was due to the greasy biscuit. She then tried a regular chicken biscuit from [employer] as well a Dreamsicle milkshake from [employer] and had "no problems." Patient usually eats meals at home, recalls cereal (cherrios, frosted flakes) with [employer] for breakfast, [place] sandwich or cheese quesadilla with chips, apple with peanut butter for lunch, drinks [employer] 2% flavored milks for snack, reports eating a variety of dinners. Patient says she is likely having popcorn chicken with baked potato from [place]. She is not drinking water, drinks Dr. Malachi Bonds, milk, tea, ensure clear. Her stools are more formed, however patient bowel movements are not regular. Reports sometimes going 10 times, other days she will go once, some days no BM. Patient continues to take Imodium BID.    Anthropometrics: Patient reports 114.6 lb on home scale today.   4/7 - 112 lb  2/25 - 108 lb   NUTRITION DIAGNOSIS: Inadequate oral intake improved   INTERVENTION:  Encouraged continuing to try new foods Continue high calorie, high protein foods to promote weight gain Continue taking appetite stimulant Pt agrees to keeping food journal for 2 weeks - include food, drink, GI symptoms, BM frequency/type    MONITORING, EVALUATION, GOAL: weight trends, intake   NEXT VISIT: Thursday June 16 via telephone

## 2020-08-26 ENCOUNTER — Encounter (HOSPITAL_COMMUNITY): Payer: 59

## 2020-08-27 ENCOUNTER — Encounter (HOSPITAL_COMMUNITY): Payer: Self-pay | Admitting: Hematology

## 2020-08-27 ENCOUNTER — Encounter: Payer: Self-pay | Admitting: Family Medicine

## 2020-08-27 ENCOUNTER — Other Ambulatory Visit: Payer: Self-pay

## 2020-08-27 ENCOUNTER — Other Ambulatory Visit (HOSPITAL_COMMUNITY): Payer: Self-pay

## 2020-08-27 ENCOUNTER — Encounter (HOSPITAL_COMMUNITY): Payer: 59

## 2020-08-27 ENCOUNTER — Ambulatory Visit: Payer: 59 | Admitting: Family Medicine

## 2020-08-27 VITALS — BP 107/73 | HR 107 | Temp 97.8°F | Ht 66.0 in | Wt 115.8 lb

## 2020-08-27 DIAGNOSIS — Z0000 Encounter for general adult medical examination without abnormal findings: Secondary | ICD-10-CM | POA: Diagnosis not present

## 2020-08-27 MED ORDER — ALPRAZOLAM 0.5 MG PO TABS
0.5000 mg | ORAL_TABLET | Freq: Three times a day (TID) | ORAL | 5 refills | Status: AC
  Filled 2020-08-27: qty 90, 30d supply, fill #0
  Filled 2020-10-12: qty 90, 30d supply, fill #1
  Filled 2020-12-07: qty 90, 30d supply, fill #2
  Filled 2021-01-17: qty 90, 30d supply, fill #3
  Filled 2021-02-20: qty 90, 30d supply, fill #4
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
## 2020-08-31 ENCOUNTER — Other Ambulatory Visit (HOSPITAL_COMMUNITY): Payer: Self-pay

## 2020-09-01 ENCOUNTER — Other Ambulatory Visit (HOSPITAL_COMMUNITY): Payer: Self-pay

## 2020-09-01 DIAGNOSIS — D511 Vitamin B12 deficiency anemia due to selective vitamin B12 malabsorption with proteinuria: Secondary | ICD-10-CM | POA: Diagnosis not present

## 2020-09-01 DIAGNOSIS — C20 Malignant neoplasm of rectum: Secondary | ICD-10-CM | POA: Diagnosis not present

## 2020-09-01 DIAGNOSIS — E063 Autoimmune thyroiditis: Secondary | ICD-10-CM | POA: Diagnosis not present

## 2020-09-01 DIAGNOSIS — E890 Postprocedural hypothyroidism: Secondary | ICD-10-CM | POA: Diagnosis not present

## 2020-09-01 DIAGNOSIS — C73 Malignant neoplasm of thyroid gland: Secondary | ICD-10-CM | POA: Diagnosis not present

## 2020-09-01 DIAGNOSIS — E860 Dehydration: Secondary | ICD-10-CM | POA: Diagnosis not present

## 2020-09-01 MED ORDER — ARMOUR THYROID 60 MG PO TABS
60.0000 mg | ORAL_TABLET | Freq: Every day | ORAL | 1 refills | Status: DC
  Filled 2020-09-01: qty 96, 84d supply, fill #0
  Filled 2020-12-07 – 2020-12-16 (×4): qty 96, 84d supply, fill #1

## 2020-09-07 ENCOUNTER — Other Ambulatory Visit (HOSPITAL_COMMUNITY): Payer: Self-pay

## 2020-09-10 ENCOUNTER — Inpatient Hospital Stay (HOSPITAL_COMMUNITY): Payer: 59 | Attending: Hematology | Admitting: Dietician

## 2020-09-10 NOTE — Progress Notes (Signed)
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 Patient is currently not in treatment. She completed SBRT 5/19 for RUL  Spoke with patient via phone. She reports completing 2 of 14 days of the food journal suggested at previous follow-up. Patient says she ate the same foods on day 1 and day 2 with normal 4-5 BM's on day 1 and 10-12 BM on day 2. Patient states sometimes she will not have a BM for 2 days followed by 1-2 days of "bad days" She is unable to "pinpoint" what causes this. Patient continues to take 1 Immodium BID. She has identified missed meals will "trigger" an episode, especially breakfast meal. Patient continues to try new foods, reports recently having some Dorritos and has been buying baked chips. She reports tolerating well, but does not eat these everyday. Patient reports eating deviled eggs that had pickles in it, says she was surprised the pickles agreed with her. She is planning a cook-out this weekend to celebrate Father's Day. Patient, would like to have a little slaw and asking if she could try eating cucumbers and tomatoes.   Anthropometrics: Patient reports 117 lbs this morning on home scale increased from reported 114.6 lb on 5/26  6/2 - 115 lb 12.8 oz (at PCP office) 4/7 - 112 lb 2/25 - 108 lb    NUTRITION DIAGNOSIS: Inadequate oral intake improved   INTERVENTION:  Encouraged continuing to try new foods Discussed introducing new foods one at a time and in small quantities Educated on gas producing foods Continue eating high calorie, high protein foods to promote weight gain    MONITORING, EVALUATION, GOAL: weight trends, intake   NEXT VISIT: Monday, July 18 via telephone

## 2020-09-15 ENCOUNTER — Other Ambulatory Visit (HOSPITAL_COMMUNITY): Payer: Self-pay

## 2020-09-15 MED ORDER — CARESTART COVID-19 HOME TEST VI KIT
PACK | 0 refills | Status: DC
  Filled 2020-09-15: qty 4, 4d supply, fill #0

## 2020-09-16 ENCOUNTER — Other Ambulatory Visit (HOSPITAL_COMMUNITY): Payer: Self-pay

## 2020-09-20 ENCOUNTER — Encounter: Payer: Self-pay | Admitting: Family Medicine

## 2020-09-20 ENCOUNTER — Encounter: Payer: Self-pay | Admitting: Physician Assistant

## 2020-09-20 ENCOUNTER — Telehealth: Payer: 59 | Admitting: Physician Assistant

## 2020-09-20 DIAGNOSIS — J040 Acute laryngitis: Secondary | ICD-10-CM

## 2020-09-20 DIAGNOSIS — J028 Acute pharyngitis due to other specified organisms: Secondary | ICD-10-CM | POA: Diagnosis not present

## 2020-09-20 DIAGNOSIS — Z20822 Contact with and (suspected) exposure to covid-19: Secondary | ICD-10-CM

## 2020-09-20 MED ORDER — AZITHROMYCIN 250 MG PO TABS: ORAL_TABLET | ORAL | 0 refills | Status: DC

## 2020-09-20 NOTE — Progress Notes (Signed)
[facility] E-Visit for [employer] Virus Screening  Your current symptoms could be consistent with the coronavirus.  Many health care providers can now test patients at their office but not all are.  [facility] has multiple testing sites. For information on our [place] testing locations and hours go to [email or web address]  We are enrolling you in our [place] for [place] . Daily you will receive a questionnaire within the [place] website. Our COVID 19 response team will be monitoring your responses daily.  Testing Information: The COVID-19 Community Testing sites are testing BY APPOINTMENT ONLY.  You can schedule online at [email or web address]  If you do not have access to a smart phone or computer you may call [phone] for an appointment.   Additional testing sites in the Community:  For [facility] Testing sites in [place]  FaceUpdate.[email or web address]  For Pop-up testing sites in [place]  [email or web address]  For [facility] [email or web address]  For [facility] testing in [place] and [employer] [email or web address]  For [facility] testing in [place]   [email or web address]  For  more information about community testing call [phone]   We are sorry that you are not feeling well.  Here is how we plan to help!  Based on what you have shared with me it is likely that you pharyngitis, an  inflammation and infection in the back of the throat.  This is an infection could be viral in nature, which would not require antibiotic. However, due to severity, chronicity, and accompanying symptoms, I have provided a prescription for Amoxicillin. I have  prescribed Azithromycin 250 mg, take 2 pills today, take one pill days 2-5. For throat pain, we recommend over the counter oral pain relief medications such as acetaminophen or aspirin, or anti-inflammatory medications such as ibuprofen or naproxen sodium. Topical treatments such as oral throat lozenges or sprays may be used as needed. Strep infections are not as easily transmitted as other respiratory infections, however we still recommend that you avoid close contact with loved ones, especially the very [place] and elderly.  Remember to wash your hands thoroughly throughout the day as this is the number one way to prevent the spread of infection and wipe down door knobs and counters with disinfectant.  I spent 5-10 minutes on review and completion of this note- Lacy Duverney [facility]   Please quarantine yourself while awaiting your test results. Please stay home for a minimum of 10 days from the first day of illness with improving symptoms and you have had 24 hours of no fever (without the use of Tylenol (Acetaminophen) Motrin (Ibuprofen) or any fever reducing medication).  Also - Do not get tested prior to returning to work because once you have had a positive test the test can stay positive for more than a month in some cases.   You should wear a mask or cloth face covering over your nose and mouth if you must be around other people or animals, including pets (even at home). Try to stay at least 6 feet away from other people. This will protect the people around you.  Please continue good preventive care measures, including:  frequent hand-washing, avoid touching your face, cover coughs/sneezes, stay out of crowds and keep a 6 foot distance from others.  COVID-19 is a respiratory illness with symptoms that are similar to the flu. Symptoms are typically mild to moderate, but there have been cases of severe illness and death due to the virus.   The following symptoms may appear  2-14 days after  exposure: Fever Cough Shortness of breath or difficulty breathing Chills Repeated shaking with chills Muscle pain Headache Sore throat New loss of taste or smell Fatigue Congestion or runny nose Nausea or vomiting Diarrhea  Go to the nearest hospital ED for assessment if fever/cough/breathlessness are severe or illness seems like a threat to life.  It is vitally important that if you feel that you have an infection such as this virus or any other virus that you stay home and away from places where you may spread it to others.  You should avoid contact with people age 74 and older.    You may also take acetaminophen (Tylenol) as needed for fever.  Reduce your risk of any infection by using the same precautions used for avoiding the common cold or flu:  Wash your hands often with soap and warm water for at least 20 seconds.  If soap and water are not readily available, use an alcohol-based hand sanitizer with at least 60% alcohol.  If coughing or sneezing, cover your mouth and nose by coughing or sneezing into the elbow areas of your shirt or coat, into a tissue or into your sleeve (not your hands). Avoid shaking hands with others and consider head nods or verbal greetings only. Avoid touching your eyes, nose, or mouth with unwashed hands.  Avoid close contact with people who are sick. Avoid places or events with large numbers of people in one location, like concerts or sporting events. Carefully consider travel plans you have or are making. If you are planning any travel outside or inside the [name], visit the CDC's Travelers' Health webpage for the latest health notices. If you have some symptoms but not all symptoms, continue to monitor at home and seek medical attention if your symptoms worsen. If you are having a medical emergency, call 911.  HOME CARE Only take medications as instructed by your medical team. Drink plenty of fluids and get plenty of rest. A steam or ultrasonic  humidifier can help if you have congestion.   GET HELP RIGHT AWAY IF YOU HAVE EMERGENCY WARNING SIGNS** FOR COVID-19. If you or someone is showing any of these signs seek emergency medical care immediately. Call 911 or proceed to your closest emergency facility if: You develop worsening high fever. Trouble breathing Bluish lips or face Persistent pain or pressure in the chest New confusion Inability to wake or stay awake You cough up blood. Your symptoms become more severe  **This list is not all possible symptoms. Contact your medical provider for any symptoms that are sever or concerning to you.  MAKE SURE YOU  Understand these instructions. Will watch your condition. Will get help right away if you are not doing well or get worse.  Your e-visit answers were reviewed by a board certified advanced clinical practitioner to complete your personal care plan.  Depending on the condition, your plan could have included both over the counter or prescription medications.  If there is a problem please reply once you have received a response from your provider.  Your safety is important to [name].  If you have drug allergies check your prescription carefully.    You can use MyChart to ask questions about today's visit, request a non-urgent call back, or ask for a work or school excuse for 24 hours related to this e-Visit. If it has been greater than 24 hours you will need to follow up with your provider, or enter a new e-Visit to address  those concerns. You will get an e-mail in the next two days asking about your experience.  I hope that your e-visit has been valuable and will speed your recovery. Thank you for using e-visits.

## 2020-09-21 ENCOUNTER — Ambulatory Visit (INDEPENDENT_AMBULATORY_CARE_PROVIDER_SITE_OTHER): Payer: 59 | Admitting: Family Medicine

## 2020-09-21 ENCOUNTER — Other Ambulatory Visit: Payer: Self-pay

## 2020-09-21 VITALS — HR 110 | Temp 97.3°F | Ht 66.0 in | Wt 115.0 lb

## 2020-09-21 DIAGNOSIS — J029 Acute pharyngitis, unspecified: Secondary | ICD-10-CM | POA: Diagnosis not present

## 2020-09-21 LAB — POCT RAPID STREP A (OFFICE): Rapid Strep A Screen: NEGATIVE

## 2020-09-21 MED ORDER — MAGIC MOUTHWASH W/LIDOCAINE: ORAL | 0 refills | Status: DC

## 2020-09-21 MED ORDER — PREDNISONE 20 MG PO TABS: 40.0000 mg | ORAL_TABLET | Freq: Every day | ORAL | 0 refills | Status: DC

## 2020-09-21 NOTE — Progress Notes (Signed)
D   Patient ID: Samantha Reynolds, female    DOB: 11-02-1965, 55 y.o.   MRN: [id number]   Chief Complaint  Patient presents with   Sore Throat    Since Thursday- feels like phlegm in throat, low grade fever and ears burning 3 home Covid tests were negative   Subjective:    HPI Cc- uri, sore throat.  "Cant' eat or drink. " 4 days ago, starte with scratchy thorat. Worse sat.  Now hoarse.  Low grade temp and ear pain. E-visit- with [facility]- gave z-pak with them had 1 dose so far.  H/o radiation to both lungs.  Taking some tylenol and ibuprofen. Not much relief. Chloroseptic spray. Sick contacts- husband with covi Pt husband tested positive wed last week.  Both work at [employer]. Neg home test 3x last week- pt Tested wed and Friday- both neg. Tested yesterday-neg  Mild coughing, has some post nasal drip. Has coughed up green phlegm.  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   Outpatient Encounter Medications as of 09/21/2020  Medication Sig   magic mouthwash w/lidocaine SOLN 1 Part viscous lidocaine 2% 1 Part Maalox 1 Part diphenhydramine 12.5 mg per 5 ml elixir Quantity: 120 ml  Sig: Swish, gargle, and spit one to two teaspoonfuls every six hours as needed. Shake well before using.   [EXPIRED] nirmatrelvir/ritonavir EUA, renal dosing, (PAXLOVID) TBPK Take 2 tablets by mouth 2 (two) times daily for 5 days. Patient GFR is >60. Take nirmatrelvir (150 mg) one tablet twice daily for 5 days and ritonavir (100 mg) one tablet twice daily for 5 days.   [DISCONTINUED] predniSONE (DELTASONE) 20 MG  tablet Take 2 tablets (40 mg total) by mouth daily with breakfast.   ALPRAZolam (XANAX) 0.5 MG tablet Take 1 tablet (0.5 mg total) by mouth 3 (three) times daily.   amitriptyline (ELAVIL) 10 MG tablet Take 1 tablet (10 mg total) by mouth 2 (two) times daily.   ARMOUR THYROID 60 MG tablet Take 60 mg by mouth every morning.   ARMOUR THYROID 60 MG tablet TAKE 1 TABLET BY MOUTH ONCE DAILY.   ARMOUR THYROID 60 MG tablet Take 1 tablet (60 mg total) by mouth daily except take 1.5 tablets on Mon. and Thurs.   COVID-19 At Home Antigen Test [facility] COVID-19 HOME TEST) KIT Use as directed   loperamide (IMODIUM) 2 MG capsule Take 1 capsule (2 mg total) by mouth 3 (three) times daily.   mirtazapine (REMERON) 15 MG tablet Take 1 tablet (15 mg total) by mouth at bedtime.   prochlorperazine (COMPAZINE) 10 MG tablet Take 1 tablet (10 mg total) by mouth every 6 (six) hours as needed for nausea or vomiting.   [DISCONTINUED] azithromycin (ZITHROMAX) 250 MG tablet Take 2 tablets on day 1, then 1 tablet daily on days 2 through 5   Facility-Administered Encounter Medications as of 09/21/2020  Medication   clindamycin (CLEOCIN) 900 mg in dextrose 5 % 50 mL IVPB   And   gentamicin (GARAMYCIN) 350 mg in dextrose 5 % 50 mL IVPB   clindamycin (CLEOCIN) 900 mg in dextrose 5 % 50 mL IVPB   And   gentamicin (GARAMYCIN) 5 mg/kg in dextrose 5 % 50 mL IVPB  sodium chloride 0.9 % 1,000 mL with potassium chloride 20 mEq, magnesium sulfate 2 g infusion     Review of Systems  Constitutional:  Negative for chills and fever.  HENT:  Positive for sore throat. Negative for congestion, ear pain, rhinorrhea, sinus pressure and sinus pain.   Eyes:  Negative for pain, discharge and itching.  Respiratory:  Negative for cough.   Gastrointestinal:  Negative for constipation, diarrhea, nausea and vomiting.    Vitals Pulse (!) 110   Temp (!) 97.3 F (36.3 C) (Oral)   Ht $R'5\' 6"'Wz$  (1.676 m)   Wt 115 lb (52.2 kg)   LMP 02/16/2015  (Approximate)   SpO2 97%   BMI 18.56 kg/m   Objective:   Physical Exam Vitals and nursing note reviewed.  Constitutional:      General: She is not in acute distress.    Appearance: Normal appearance. She is not ill-appearing or toxic-appearing.  HENT:     Head: Normocephalic and atraumatic.     Right Ear: Tympanic membrane, ear canal and external ear normal.     Left Ear: Tympanic membrane, ear canal and external ear normal.     Nose: Nose normal. No congestion or rhinorrhea.     Mouth/Throat:     Mouth: Mucous membranes are moist.     Pharynx: Oropharynx is clear. Posterior oropharyngeal erythema present. No oropharyngeal exudate.  Eyes:     Extraocular Movements: Extraocular movements intact.     Conjunctiva/sclera: Conjunctivae normal.     Pupils: Pupils are equal, round, and reactive to light.  Cardiovascular:     Rate and Rhythm: Normal rate and regular rhythm.     Pulses: Normal pulses.     Heart sounds: Normal heart sounds.  Pulmonary:     Effort: Pulmonary effort is normal. No respiratory distress.     Breath sounds: Normal breath sounds. No wheezing, rhonchi or rales.  Musculoskeletal:     Cervical back: Normal range of motion.  Lymphadenopathy:     Cervical: No cervical adenopathy.  Skin:    General: Skin is warm and dry.     Findings: No rash.  Neurological:     Mental Status: She is alert and oriented to person, place, and time.  Psychiatric:        Mood and Affect: Mood normal.        Behavior: Behavior normal.   97% / 110hr  Assessment and Plan   1. Pharyngitis, unspecified etiology - Novel Coronavirus, NAA ([facility]) - POCT rapid strep A    Negative rapid strep. Will give prednisone and [facility] magic mouthwash for the pain. Cont with z-pak from e-visit. Call if not improving. Covid test pending  Return if symptoms worsen or fail to improve.  Addendum- pt was positive for covid.  Called in paxlovid, discussed with pharmacist, no interaction  with elavil, remeron, or aurmor thyroid.  Normal kidney and liver functions.

## 2020-09-21 NOTE — Telephone Encounter (Signed)
Pt contacted and scheduled appt with Dr.Taylor for 11:00

## 2020-09-22 ENCOUNTER — Telehealth: Payer: Self-pay | Admitting: Family Medicine

## 2020-09-22 LAB — NOVEL CORONAVIRUS, NAA: SARS-CoV-2, NAA: DETECTED — AB

## 2020-09-22 LAB — SARS-COV-2, NAA 2 DAY TAT

## 2020-09-22 MED ORDER — PAXLOVID 10 X 150 MG & 10 X 100MG PO TBPK: 2.0000 | ORAL_TABLET | Freq: Two times a day (BID) | ORAL | 0 refills | Status: AC

## 2020-09-22 NOTE — Telephone Encounter (Signed)
Nurses  Patient was seen by Dr. Lovena Le yesterday Positive for COVID Paxlovid prescribed Initially pharmacist at [facility] drug stated that it would be fine to take this medicine with all her other medicines now they are stating that there could be prolonged QTC with Remeron.  Remeron would need to be held.  I communicated with the patient to hold Remeron over the course of the next 5 days  It would be fine for the pharmacist to dispense the medicine please let the pharmacist know that the patient has been told to hold the Remeron.  After shared discussion with the patient we will hold Zithromax unlikely to give any further benefit.  Also will stop prednisone while on Paxlovid Patient will let us know if she is getting worse as the week goes on for if she needs to be seen again

## 2020-09-22 NOTE — Telephone Encounter (Signed)
Test results printed out and pt came by to pick them up at back door

## 2020-09-22 NOTE — Telephone Encounter (Signed)
[facility] drug has been notified that pt was advised to hold Remeron

## 2020-09-22 NOTE — Telephone Encounter (Signed)
Pt calling in voiced that she would like the coivd documentation stating that she is positive for work purposes. Husband will be by to pick it up

## 2020-09-24 NOTE — Progress Notes (Signed)
  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 The patient received 54 Gy In 3 fractions at 18 G per fraction.  Narrative:  The patient was contacted today for routine follow-up. During treatment she did very well with radiotherapy and did not have significant desquamation. She reports she is slowly recovering from Covid.  Impression/Plan: 1.  Stage IV, [id number] adenocarcinoma of the rectum. The patient has been doing well since completion of radiotherapy. We discussed that we would be happy to continue to follow her as needed, but she will also continue to follow up with Dr. Delton Coombes in medical oncology.       Carola Rhine, PAC

## 2020-10-05 ENCOUNTER — Ambulatory Visit
Admission: RE | Admit: 2020-10-05 | Discharge: 2020-10-05 | Disposition: A | Discharge status: Home / Self Care | Payer: 59 | Source: Ambulatory Visit | Attending: Radiation Oncology | Admitting: Radiation Oncology

## 2020-10-05 DIAGNOSIS — C20 Malignant neoplasm of rectum: Secondary | ICD-10-CM

## 2020-10-10 ENCOUNTER — Encounter: Payer: Self-pay | Admitting: Family Medicine

## 2020-10-12 ENCOUNTER — Telehealth (HOSPITAL_COMMUNITY): Payer: Self-pay | Admitting: Dietician

## 2020-10-12 ENCOUNTER — Encounter (HOSPITAL_COMMUNITY): Payer: 59 | Admitting: Dietician

## 2020-10-12 NOTE — Telephone Encounter (Signed)
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 She is currently not in treatment. Patient completed SBRT 5/19 for RUL  Spoke with patient via telephone. She reports recently having Covid, endorses extremely sore throat, unable to talk and eating only ice cream for 3 days. Patient reports this was not good for her stomach, but has gotten back to normal. Patient states her taste continues to be off, chocolate is the only food that taste normal. She reports trying slaw for the first time prior to getting Covid, states she was "doubled over almost immediately" was in the bathroom for 2 hours. Patient reports tolerating steak and cheese as well as a few pieces of watermelon last week.   Medications: reviewed Labs: reviewed   Anthropometrics: Patient reports 119 lb on home scale today   Weight 115 lb on 6/27 stable with 115 lb 12.8 oz on 6/2  112 lb - 4/7 108 lb - 2/25   NUTRITION DIAGNOSIS: Inadequate oral intake stable    INTERVENTION:  Discussed strategies for altered taste Suggested baking soda/salt water rinses several times/day before meals, recipe provided Continue eating high calorie, high protein foods to promote weight gain Encouraged trying new foods as tolerated    MONITORING, EVALUATION, GOAL: weight trends, intake   NEXT VISIT: Monday, August 29 via telephone (pt is aware)

## 2020-10-13 ENCOUNTER — Other Ambulatory Visit (HOSPITAL_COMMUNITY): Payer: Self-pay

## 2020-10-28 ENCOUNTER — Other Ambulatory Visit (HOSPITAL_COMMUNITY): Payer: Self-pay | Admitting: *Deleted

## 2020-10-29 ENCOUNTER — Ambulatory Visit (HOSPITAL_COMMUNITY): Payer: 59 | Admitting: Hematology

## 2020-11-05 ENCOUNTER — Other Ambulatory Visit: Payer: Self-pay

## 2020-11-05 ENCOUNTER — Encounter (HOSPITAL_COMMUNITY)
Admission: RE | Admit: 2020-11-05 | Discharge: 2020-11-05 | Disposition: A | Discharge status: Home / Self Care | Payer: 59 | Source: Ambulatory Visit | Attending: General Surgery | Admitting: General Surgery

## 2020-11-05 DIAGNOSIS — C787 Secondary malignant neoplasm of liver and intrahepatic bile duct: Secondary | ICD-10-CM | POA: Diagnosis not present

## 2020-11-05 DIAGNOSIS — C20 Malignant neoplasm of rectum: Secondary | ICD-10-CM | POA: Diagnosis not present

## 2020-11-05 LAB — COMPREHENSIVE METABOLIC PANEL
ALT: 9 U/L (ref 0–44)
AST: 19 U/L (ref 15–41)
Albumin: 3.8 g/dL (ref 3.5–5.0)
Alkaline Phosphatase: 78 U/L (ref 38–126)
Anion gap: 5 (ref 5–15)
BUN: 13 mg/dL (ref 6–20)
CO2: 25 mmol/L (ref 22–32)
Calcium: 8.4 mg/dL — ABNORMAL LOW (ref 8.9–10.3)
Chloride: 105 mmol/L (ref 98–111)
Creatinine, Ser: 0.84 mg/dL (ref 0.44–1.00)
GFR, Estimated: >60 mL/min (ref >60)
Glucose, Bld: 86 mg/dL (ref 70–99)
Potassium: 3.5 mmol/L (ref 3.5–5.1)
Sodium: 135 mmol/L (ref 135–145)
Total Bilirubin: 2.8 mg/dL — ABNORMAL HIGH (ref 0.3–1.2)
Total Protein: 6.6 g/dL (ref 6.5–8.1)

## 2020-11-05 LAB — CBC WITH DIFFERENTIAL/PLATELET
Abs Immature Granulocytes: 0.01 10*3/uL (ref 0.00–0.07)
Basophils Absolute: 0 10*3/uL (ref 0.0–0.1)
Basophils Relative: 0 %
Eosinophils Absolute: 0 10*3/uL (ref 0.0–0.5)
Eosinophils Relative: 1 %
HCT: 30.6 % — ABNORMAL LOW (ref 36.0–46.0)
Hemoglobin: 10.7 g/dL — ABNORMAL LOW (ref 12.0–15.0)
Immature Granulocytes: 1 %
Lymphocytes Relative: 17 %
Lymphs Abs: 0.4 10*3/uL — ABNORMAL LOW (ref 0.7–4.0)
MCH: 34.1 pg — ABNORMAL HIGH (ref 26.0–34.0)
MCHC: 35 g/dL (ref 30.0–36.0)
MCV: 97.5 fL (ref 80.0–100.0)
Monocytes Absolute: 0.1 10*3/uL (ref 0.1–1.0)
Monocytes Relative: 6 %
Neutro Abs: 1.6 10*3/uL — ABNORMAL LOW (ref 1.7–7.7)
Neutrophils Relative %: 75 %
Platelets: 83 10*3/uL — ABNORMAL LOW (ref 150–400)
RBC: 3.14 MIL/uL — ABNORMAL LOW (ref 3.87–5.11)
RDW: 13.2 % (ref 11.5–15.5)
WBC: 2.1 10*3/uL — ABNORMAL LOW (ref 4.0–10.5)
nRBC: 0 % (ref 0.0–0.2)

## 2020-11-05 MED ORDER — HEPARIN SOD (PORK) LOCK FLUSH 100 UNIT/ML IV SOLN
500.0000 [IU] | INTRAVENOUS | Status: AC | PRN
  Administered 2020-11-05: 500 [IU]

## 2020-11-06 ENCOUNTER — Ambulatory Visit (HOSPITAL_COMMUNITY)
Admission: RE | Admit: 2020-11-06 | Discharge: 2020-11-06 | Disposition: A | Discharge status: Home / Self Care | Payer: 59 | Source: Ambulatory Visit | Attending: Hematology | Admitting: Hematology

## 2020-11-06 DIAGNOSIS — C787 Secondary malignant neoplasm of liver and intrahepatic bile duct: Secondary | ICD-10-CM | POA: Insufficient documentation

## 2020-11-06 DIAGNOSIS — C189 Malignant neoplasm of colon, unspecified: Secondary | ICD-10-CM | POA: Insufficient documentation

## 2020-11-06 DIAGNOSIS — I712 Thoracic aortic aneurysm, without rupture: Secondary | ICD-10-CM | POA: Diagnosis not present

## 2020-11-06 DIAGNOSIS — I700 Atherosclerosis of aorta: Secondary | ICD-10-CM | POA: Diagnosis not present

## 2020-11-06 DIAGNOSIS — K838 Other specified diseases of biliary tract: Secondary | ICD-10-CM | POA: Diagnosis not present

## 2020-11-06 DIAGNOSIS — D734 Cyst of spleen: Secondary | ICD-10-CM | POA: Diagnosis not present

## 2020-11-06 LAB — CEA: CEA: 1.2 ng/mL (ref 0.0–4.7)

## 2020-11-06 MED ORDER — IOHEXOL 350 MG/ML SOLN
85.0000 mL | Freq: Once | INTRAVENOUS | Status: AC | PRN
  Administered 2020-11-06: 85 mL via INTRAVENOUS

## 2020-11-08 NOTE — Progress Notes (Signed)
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 FOLFOX & vectibix x 7 cycles from 03/14/2018 to 06/27/2018. 2. XRT with Xeloda from 07/30/2018 to 09/06/2018. 3. Right liver lesion microwave ablation on 10/15/2018. 4. Low anterior resection and diverting ileostomy on 12/07/2018. 5. SBRT to liver 60 Gy in 5 fractions from 11/15/2019 to 12/13/2019.  NGS Results: Foundation 1 K-RAS/NRAS wild-type, MS--stable, TMB--low  CURRENT THERAPY: Surveillance  BRIEF ONCOLOGIC HISTORY:  Oncology History  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[facility])  02/26/2018 Initial Diagnosis   Rectal cancer ([facility])   03/13/2018 Cancer Staging   Staging form: Colon and Rectum, AJCC 8th Edition - Clinical stage from 03/13/2018: Stage IVA (cT3, cN1b, cM1a) - Signed by Derek Jack, MD on 03/13/2018   03/14/2018 - 06/29/2018 Chemotherapy   The patient had palonosetron (ALOXI) injection 0.25 mg, 0.25 mg, Intravenous,  Once, 7 of 8 cycles Administration: 0.25 mg (03/14/2018), 0.25 mg (03/27/2018), 0.25 mg (04/18/2018), 0.25 mg (05/02/2018), 0.25 mg (05/16/2018), 0.25 mg (06/05/2018), 0.25 mg (06/27/2018) leucovorin 800 mg in dextrose 5 % 250 mL infusion, 772 mg, Intravenous,  Once, 7 of 8 cycles Administration: 800 mg (03/14/2018), 800 mg (03/27/2018), 800 mg (05/02/2018), 800 mg (05/16/2018), 700 mg (04/18/2018), 800 mg (06/05/2018), 800 mg (06/27/2018) oxaliplatin (ELOXATIN) 165 mg in dextrose 5 % 500 mL chemo infusion, 85 mg/m2 = 165 mg, Intravenous,  Once, 6 of 7 cycles Dose modification: 68 mg/m2 (80 % of original dose 85 mg/m2, Cycle 4, Reason: Provider Judgment) Administration: 165 mg (03/14/2018), 165 mg (03/27/2018), 130 mg (05/02/2018), 130 mg (05/16/2018), 130 mg (06/05/2018), 130 mg  (06/27/2018) panitumumab (VECTIBIX) 500 mg in sodium chloride 0.9 % 100 mL chemo infusion, 480 mg, Intravenous,  Once, 4 of 5 cycles Administration: 500 mg (04/18/2018), 480 mg (05/02/2018), 480 mg (05/16/2018), 480 mg (06/05/2018) fluorouracil (ADRUCIL) chemo injection 750 mg, 400 mg/m2 = 750 mg (100 % of original dose 400 mg/m2), Intravenous,  Once, 6 of 7 cycles Dose modification: 400 mg/m2 (original dose 400 mg/m2, Cycle 1), 400 mg/m2 (original dose 400 mg/m2, Cycle 3) Administration: 750 mg (03/14/2018), 750 mg (04/18/2018), 750 mg (05/02/2018), 750 mg (05/16/2018), 750 mg (06/05/2018), 750 mg (06/27/2018) fosaprepitant (EMEND) 150 mg, dexamethasone (DECADRON) 12 mg in sodium chloride 0.9 % 145 mL IVPB, , Intravenous,  Once, 4 of 5 cycles Administration:  (05/02/2018),  (05/16/2018),  (06/05/2018),  (06/27/2018) fluorouracil (ADRUCIL) 4,650 mg in sodium chloride 0.9 % 57 mL chemo infusion, 2,400 mg/m2 = 4,650 mg, Intravenous, 1 Day/Dose, 7 of 8 cycles Administration: 4,650 mg (03/14/2018), 4,650 mg (03/27/2018), 4,650 mg (04/18/2018), 4,650 mg (05/02/2018), 4,650 mg (05/16/2018), 4,650 mg (06/05/2018), 4,650 mg (06/27/2018)   for chemotherapy treatment.       CANCER STAGING: Cancer Staging Malignant neoplasm of rectum [facility]) Staging form: Colon and Rectum, AJCC 8th Edition - Clinical stage from 03/13/2018: Stage IVA (cT3, cN1b, cM1a) - Signed by Derek Jack, MD on 03/13/2018   INTERVAL HISTORY:  Samantha Reynolds, a 55 y.o. 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 M[place]ell was last seen on 07/02/20.   Today she reports feeling good. She temporarily stopped Remeron during a Covid infection in 06/27, and she did not notice a change in her appetite. Her appetite is  good, and she denies severe fatigue. She is eating 3 solid meals a day without any boost/ensure, and she reports normal BM and denies n/v/d.   REVIEW OF SYSTEMS:  Review of Systems  Constitutional:   Negative for appetite change (85%) and fatigue (85%).  Gastrointestinal:  Negative for constipation, diarrhea, nausea and vomiting.  All other systems reviewed and are negative.  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   Worse in her 24s when she was ill   H/O Hashimoto thyroiditis    History of chemotherapy    History of radiation therapy    History of thyroid cancer no recurrence   2013--  s/p  left lobe thyroidectomy--  follicular varient papillary / lymphocyctic thyroiditis   Hyperlipidemia 11/09/2015   Hypothyroidism    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HCC) 02/26/2018   Past Surgical History:  Procedure Laterality Date   BIOPSY  02/19/2018   Procedure: BIOPSY;  Surgeon: Malissa Hippo, MD;  [facility];  Service: Endoscopy;;  rectum   COLONOSCOPY N/A 08/20/2015   Procedure: COLONOSCOPY;  Surgeon: Malissa Hippo, MD;  [facility];  Service: Endoscopy;  Laterality: N/A;  730   D & C HYSTEROOSCOPY W/ THERMACHOICE ENDOMETRIAL ABLATION  08-13-2004   DIVERTING ILEOSTOMY N/A 12/07/2018   Procedure: DIVERTING LOOP ILEOSTOMY;  Surgeon: Romie Levee, MD;  Location: [facility];  Service: General;  Laterality: N/A;   FLEXIBLE SIGMOIDOSCOPY N/A 02/19/2018   Procedure: FLEXIBLE SIGMOIDOSCOPY;  Surgeon: Malissa Hippo, MD;  [facility];  Service: Endoscopy;  Laterality: N/A;   HYSTEROSCOPY WITH D & C N/A 04/30/2015   Procedure: DILATATION AND CURETTAGE / INTENDED HYSTEROSCOPY;  Surgeon: Marcelle Overlie, MD;  Location: [facility];  Service: Gynecology;  Laterality: N/A;   ILEOSTOMY CLOSURE N/A 04/17/2019   Procedure: LOOP ILEOSTOMY REVERSAL;  Surgeon: Romie Levee, MD;  Location: [facility];  Service: General;  Laterality: N/A;   IR US GUIDE BX  ASP/DRAIN  03/09/2018   LAPAROSCOPIC CHOLECYSTECTOMY  04/1996   LAPAROSCOPY N/A 12/11/2018   Procedure: LAPAROSCOPY  ILEOSTOMY REVISION AND ABDOMINAL WASHOUT;  Surgeon: Romie Levee, MD;  [facility];  Service: General;  Laterality: N/A;   Liver Microwave   10/17/2018   POLYPECTOMY  08/20/2015   Procedure: POLYPECTOMY;  Surgeon: Malissa Hippo, MD;  [facility];  Service: Endoscopy;;  Splenic flexure polypectomy   POLYPECTOMY  02/19/2018   Procedure: POLYPECTOMY;  Surgeon: Malissa Hippo, MD;  [facility];  Service: Endoscopy;;  rectum   PORTACATH PLACEMENT N/A 03/14/2018   Procedure: INSERTION PORT-A-CATH;  Surgeon: Franky Macho, MD;  Location: [facility];  Service: General;  Laterality: N/A;   REDUCTION INCARCERATED UTERUS  06-20-2000   intrauterine preg. 13 wks /  urinary retention   THYROID LOBECTOMY  11/24/2011   Procedure: THYROID LOBECTOMY;  Surgeon: Velora Heckler, MD;  [facility];  Service: General;  Laterality: Left;  Left Thyroid Lobectomy   TUBAL LIGATION  2002   XI ROBOTIC ASSISTED LOWER ANTERIOR RESECTION N/A 12/07/2018   Procedure: XI ROBOTIC ASSISTED LOWER ANTERIOR RESECTION, SPENIC FLEXURE IMMOBILIZATION, COLOANAL ANASTOMOSIS, RIGID PROCTOSCOPY;  Surgeon: Romie Levee, MD;  [facility];  Service: General;  Laterality: N/A;  SOCIAL HISTORY:  Social History   Socioeconomic History   Marital status: Married    Spouse name: Not on file   Number of children: 4   Years of education: Not on file   Highest education level: Not on file  Occupational History    Comment: Marketing executive at [employer]   Smoking status: Former    Packs/day: 0.25    Years: 10.00    Pack years: 2.50    Types: Cigarettes    Quit date: 10/31/1991    Years since quitting: 29.0   Smokeless tobacco: Never  Vaping Use   Vaping Use: Never used  Substance and Sexual Activity   Alcohol use: No   Drug use: No   Sexual activity: Not Currently  Other  Topics Concern   Not on file  Social History Narrative   Lives with husband [name] and 38 year old son [name]   Husband is Interior and spatial designer of [employer]   Social Determinants of Radio broadcast assistant Strain: Not on file  Food Insecurity: Not on file  Transportation Needs: Not on file  Physical Activity: Not on file  Stress: Not on file  Social Connections: Not on file  Intimate Partner Violence: Not on file    FAMILY HISTORY:  Family History  Problem Relation Age of Onset   Hypertension Mother    Hypertension Father    Prostate cancer Father    Cancer Maternal Aunt        spine/back   Cancer Paternal Grandfather        lung   Healthy Son    Healthy Son    Healthy Son    Healthy Son    Colon cancer Neg Hx     CURRENT MEDICATIONS:  Current Outpatient Medications  Medication Sig Dispense Refill   ALPRAZolam (XANAX) 0.5 MG tablet Take 1 tablet (0.5 mg total) by mouth 3 (three) times daily. 90 tablet 5   amitriptyline (ELAVIL) 10 MG tablet Take 1 tablet (10 mg total) by mouth 2 (two) times daily. 180 tablet 1   ARMOUR THYROID 60 MG tablet Take 60 mg by mouth every morning.     ARMOUR THYROID 60 MG tablet TAKE 1 TABLET BY MOUTH ONCE DAILY. 90 tablet 1   ARMOUR THYROID 60 MG tablet Take 1 tablet (60 mg total) by mouth daily except take 1.5 tablets on Mon. and Thurs. 120 tablet 1   COVID-19 At Home Antigen Test (CARESTART COVID-19 HOME TEST) KIT Use as directed 4 each 0   loperamide (IMODIUM) 2 MG capsule Take 1 capsule (2 mg total) by mouth 3 (three) times daily. 30 capsule 0   magic mouthwash w/lidocaine SOLN 1 Part viscous lidocaine 2% 1 Part Maalox 1 Part diphenhydramine 12.5 mg per 5 ml elixir Quantity: 120 ml  Sig: Swish, gargle, and spit one to two teaspoonfuls every six hours as needed. Shake well before using. 120 mL 0   mirtazapine (REMERON) 15 MG tablet Take 1 tablet (15 mg total) by mouth at bedtime. 30 tablet 3   prochlorperazine (COMPAZINE) 10 MG tablet Take  1 tablet (10 mg total) by mouth every 6 (six) hours as needed for nausea or vomiting. 60 tablet 3   No current facility-administered medications for this visit.   Facility-Administered Medications Ordered in Other Visits  Medication Dose Route Frequency Provider Last Rate Last Admin   clindamycin (CLEOCIN) 900 mg in dextrose 5 % 50 mL IVPB  900 mg Intravenous  60 min Pre-Op Leighton Ruff, MD       And   gentamicin (GARAMYCIN) 350 mg in dextrose 5 % 50 mL IVPB  5 mg/kg Intravenous 60 min Pre-Op Leighton Ruff, MD       clindamycin (CLEOCIN) 900 mg in dextrose 5 % 50 mL IVPB  900 mg Intravenous 60 min Pre-Op Leighton Ruff, MD       And   gentamicin (GARAMYCIN) 5 mg/kg in dextrose 5 % 50 mL IVPB  5 mg/kg Intravenous 60 min Pre-Op Leighton Ruff, MD       sodium chloride 0.9 % 1,000 mL with potassium chloride 20 mEq, magnesium sulfate 2 g infusion   Intravenous Once Derek Jack, MD        ALLERGIES:  Allergies  Allergen Reactions   Demerol [Meperidine] Itching    All over the body.   Penicillins Hives     childhood, does not remember if it spread all over the body or not. Did it involve swelling of the face/tongue/throat, SOB, or low BP? No Did it involve sudden or severe rash/hives, skin peeling, or any reaction on the inside of your mouth or nose? Yes Did you need to seek medical attention at a hospital or doctor's office? Yes When did it last happen?      childhood allergy If all above answers are "NO", may proceed with cephalosporin use.    Other Hives and Other (See Comments)    Fresh coconut    Vancomycin Rash    Will need Benadryl prior to administration per IV. "Red man syndrome"    PHYSICAL EXAM:  Performance status (ECOG): 0 - Asymptomatic  There were no vitals filed for this visit. Wt Readings from Last 3 Encounters:  09/21/20 115 lb (52.2 kg)  08/27/20 115 lb 12.8 oz (52.5 kg)  07/02/20 112 lb (50.8 kg)   Physical Exam Vitals reviewed.  Constitutional:       Appearance: Normal appearance.  Cardiovascular:     Rate and Rhythm: Normal rate and regular rhythm.     Pulses: Normal pulses.     Heart sounds: Normal heart sounds.  Pulmonary:     Effort: Pulmonary effort is normal.     Breath sounds: Normal breath sounds.  Abdominal:     Palpations: Abdomen is soft. There is no hepatomegaly, splenomegaly or mass.     Tenderness: There is no abdominal tenderness.  Musculoskeletal:     Right lower leg: No edema.     Left lower leg: No edema.  Lymphadenopathy:     Lower Body: No right inguinal adenopathy. No left inguinal adenopathy.  Neurological:     General: No focal deficit present.     Mental Status: She is alert and oriented to person, place, and time.  Psychiatric:        Mood and Affect: Mood normal.        Behavior: Behavior normal.     LABORATORY DATA:  I have reviewed the labs as listed.  CBC Latest Ref Rng & Units 11/05/2020 07/02/2020 03/31/2020  WBC 4.0 - 10.5 K/uL 2.1(L) 2.7(L) 3.4(L)  Hemoglobin 12.0 - 15.0 g/dL 10.7(L) 11.5(L) 11.7(L)  Hematocrit 36.0 - 46.0 % 30.6(L) 32.9(L) 32.9(L)  Platelets 150 - 400 K/uL 83(L) 117(L) 94(L)   CMP Latest Ref Rng & Units 11/05/2020 07/02/2020 03/31/2020  Glucose 70 - 99 mg/dL 86 108(H) 95  BUN 6 - 20 mg/dL '13 19 15  '$ Creatinine 0.44 - 1.00 mg/dL 0.84 0.79 0.72  Sodium 135 -  145 mmol/L 135 137 138  Potassium 3.5 - 5.1 mmol/L 3.5 3.7 3.2(L)  Chloride 98 - 111 mmol/L 105 104 104  CO2 22 - 32 mmol/L $RemoveB'25 24 24  'uwypOqRn$ Calcium 8.9 - 10.3 mg/dL 8.4(L) 8.7(L) 9.1  Total Protein 6.5 - 8.1 g/dL 6.6 7.3 7.1  Total Bilirubin 0.3 - 1.2 mg/dL 2.8(H) 1.9(H) 2.8(H)  Alkaline Phos 38 - 126 U/L 78 94 84  AST 15 - 41 U/L $Remo'19 21 22  'lJhIV$ ALT 0 - 44 U/L $Remo'9 12 13    'fnwUE$ DIAGNOSTIC IMAGING:  I have independently reviewed the scans and discussed with the patient. No results found.   ASSESSMENT:  1.  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  Liver biopsy on 03/09/2018 consistent with adenocarcinoma. 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 -7 cycles of FOLFOX with vectibix completed on 06/27/2018. -XRT with Xeloda from 07/30/2018 through 09/06/2018. -Right liver lesion microwave [facility] on 10/15/2018 at [facility]. -Low anterior resection and diverting ileostomy on 12/07/2018, pathology [id number], 0/6 lymph nodes positive, margins negative. -Loop ileostomy reversal on 04/17/2019. -CTAP on 07/10/2019 showed left lower lobe pulmonary nodule increased by 1 mm, measuring 8 mm.  3 mm left upper lobe lung nodule is unchanged.  Probable 4 mm right apical pulmonary nodule felt to be enlarged from 2 mm from October 2020.  Vague hypoattenuation within the posterior right hepatic lobe measures 1.5 cm, measuring 1.2 cm in October 2020.  Subtle subcapsular right hepatic lobe hypoattenuating 1 cm lesion was not seen on prior scans. -We reviewed results of MRI of the abdomen with and without contrast from 07/16/2019.  2 enhancing lesions in the right hepatic lobe, largest one measuring 1.5 x 1.0 cm.  Inferior lesion is measured as 1 cm.  Stable splenomegaly. -CEA is 2.4. -Microwave ablation of the right hepatic lobe lesion by Dr. Maudie Mercury around 08/15/2019. -SBRT to the left lower lobe lung lesion and left liver lobe lesion on 12/13/2019 at [facility]. -CT CAP from 01/22/2020 showed interval enlargement of medial right pulmonary nodule measuring about 7 mm, previously 5 mm.  Evaluation of the new lesion in the left lobe of the liver is limited by metallic fiducial marking clips although lesion appears to be diminished.  Low attenuation ablation sites in the right lobe of the liver unchanged.  Left lower lobe lung lesion also decreased in size. - CT CAP on 04/01/2020 showed stable 7 mm right upper lobe lung nodule.  Left lower lobe nodule/scarring is stable.  2 ablation defects in the posterior and inferior right hepatic lobe are stable in appearance without  evidence of residual contrast-enhancement.  2 cm cystic lesion in the anterior aspect of the spleen is stable.  Stable mild splenomegaly. - Right upper lobe lesion SBRT from 08/06/2020 through 08/13/2020.   2.  Hyperbilirubinemia: -Total bilirubin is 5.1.  She has Gilbert's syndrome.  Baseline is between 2 and 3.   3.  Hypothyroidism: -She is on Armour Thyroid.  TSH is 3.6.   4.  Weight loss: -She had 10 pound weight loss since her last visit 3 months ago.  However she had ileostomy reversal and is adjusting to new food habits.   PLAN:  1.  Stage IV rectal adenocarcinoma: - She underwent right lung lesion SBRT in May of this year. - She does not report any change in bowel habits.  No bleeding per rectum or melena.  She reportedly had COVID on June 27. - Reviewed  CT CAP from 11/06/2020.  Stable to slight decrease in the size of the index lung nodules both right upper lobe and left lower lobe.  Stable appearance of treated lesions within the right hepatic lobe with no new or progressive disease seen. - Reviewed labs from 11/05/2020.  LFTs are normal.  CEA was 1.2. - Hemoglobin is 10.7, decreased from previous value of 11.5.  We will check ferritin and iron panel along with folic acid and I15 levels. - At this time we will change her follow-ups to every 6 months with repeat CT CAP and CEA level.   2.  Hyperbilirubinemia: - She has Gilbert's syndrome.  Total bilirubin is 2.8.   3.  Weight loss: - Her appetite improved.  She is eating 3 meals per day.  She is not requiring any nutritional supplements.  Her weight has improved by 8 pounds since June. - She will taper off her Remeron 1 tablet every other day and discontinue it.   4.  Hypothyroidism: -Continue Armour Thyroid 50 mcg daily and 75 mcg on Monday and Thursday.   5.  Leukopenia and thrombocytopenia: - She has intermittent leukopenia and thrombocytopenia.  Spleen size is around 12 cm. - We will check P79, folic acid, methylmalonic  acid and copper levels.   Orders placed this encounter:  No orders of the defined types were placed in this encounter.    Derek Jack, MD [facility] [phone]   I, Thana Ates, am acting as a scribe for Dr. Derek Jack.  I, Derek Jack MD, have reviewed the above documentation for accuracy and completeness, and I agree with the above.

## 2020-11-09 ENCOUNTER — Inpatient Hospital Stay (HOSPITAL_COMMUNITY): Payer: 59 | Attending: Hematology | Admitting: Hematology

## 2020-11-09 ENCOUNTER — Other Ambulatory Visit: Payer: Self-pay

## 2020-11-09 ENCOUNTER — Encounter (HOSPITAL_COMMUNITY): Payer: Self-pay | Admitting: Hematology

## 2020-11-09 VITALS — BP 114/74 | HR 93 | Temp 98.9°F | Resp 18 | Wt 123.3 lb

## 2020-11-09 DIAGNOSIS — R634 Abnormal weight loss: Secondary | ICD-10-CM | POA: Diagnosis not present

## 2020-11-09 DIAGNOSIS — D72819 Decreased white blood cell count, unspecified: Secondary | ICD-10-CM | POA: Diagnosis not present

## 2020-11-09 DIAGNOSIS — C20 Malignant neoplasm of rectum: Secondary | ICD-10-CM | POA: Insufficient documentation

## 2020-11-09 DIAGNOSIS — Z8042 Family history of malignant neoplasm of prostate: Secondary | ICD-10-CM | POA: Insufficient documentation

## 2020-11-09 DIAGNOSIS — Z8249 Family history of ischemic heart disease and other diseases of the circulatory system: Secondary | ICD-10-CM | POA: Insufficient documentation

## 2020-11-09 DIAGNOSIS — C787 Secondary malignant neoplasm of liver and intrahepatic bile duct: Secondary | ICD-10-CM | POA: Diagnosis not present

## 2020-11-09 DIAGNOSIS — Z923 Personal history of irradiation: Secondary | ICD-10-CM | POA: Insufficient documentation

## 2020-11-09 DIAGNOSIS — Z808 Family history of malignant neoplasm of other organs or systems: Secondary | ICD-10-CM | POA: Insufficient documentation

## 2020-11-09 DIAGNOSIS — C189 Malignant neoplasm of colon, unspecified: Secondary | ICD-10-CM | POA: Diagnosis not present

## 2020-11-09 DIAGNOSIS — Z87891 Personal history of nicotine dependence: Secondary | ICD-10-CM | POA: Insufficient documentation

## 2020-11-09 DIAGNOSIS — D696 Thrombocytopenia, unspecified: Secondary | ICD-10-CM | POA: Insufficient documentation

## 2020-11-09 DIAGNOSIS — Z85850 Personal history of malignant neoplasm of thyroid: Secondary | ICD-10-CM | POA: Insufficient documentation

## 2020-11-09 DIAGNOSIS — Z79899 Other long term (current) drug therapy: Secondary | ICD-10-CM | POA: Insufficient documentation

## 2020-11-09 DIAGNOSIS — E039 Hypothyroidism, unspecified: Secondary | ICD-10-CM | POA: Insufficient documentation

## 2020-11-09 DIAGNOSIS — Z9221 Personal history of antineoplastic chemotherapy: Secondary | ICD-10-CM | POA: Insufficient documentation

## 2020-11-09 DIAGNOSIS — Z801 Family history of malignant neoplasm of trachea, bronchus and lung: Secondary | ICD-10-CM | POA: Insufficient documentation

## 2020-11-09 LAB — IRON AND TIBC
Iron: 85 ug/dL (ref 28–170)
Saturation Ratios: 22 % (ref 10.4–31.8)
TIBC: 385 ug/dL (ref 250–450)
UIBC: 300 ug/dL

## 2020-11-09 LAB — VITAMIN B12: Vitamin B-12: 223 pg/mL (ref 180–914)

## 2020-11-09 LAB — FOLATE: Folate: 17.8 ng/mL (ref >5.9)

## 2020-11-09 LAB — FERRITIN: Ferritin: 54 ng/mL (ref 11–307)

## 2020-11-09 NOTE — Patient Instructions (Addendum)
[place] at [facility] Discharge Instructions  You were seen today by Dr. Delton Coombes. He went over your recent results and scans. You will be scheduled for a CT scan of your chest, abdomen, and pelvis prior to your next appointment. Dr. Delton Coombes will see you back in 6 months for labs and follow up.   Thank you for choosing [place] at [facility] to provide your oncology and hematology care.  To afford each patient quality time with our provider, please arrive at least 15 minutes before your scheduled appointment time.   If you have a lab appointment with the [facility] please come in thru the Main Entrance and check in at the main information desk  You need to re-schedule your appointment should you arrive 10 or more minutes late.  We strive to give you quality time with our providers, and arriving late affects you and other patients whose appointments are after yours.  Also, if you no show three or more times for appointments you may be dismissed from the clinic at the providers discretion.     Again, thank you for choosing [facility].  Our hope is that these requests will decrease the amount of time that you wait before being seen by our physicians.       _____________________________________________________________  Should you have questions after your visit to [facility], please contact our office at [phone] between the hours of 8:00 a.m. and 4:30 p.m.  Voicemails left after 4:00 p.m. will not be returned until the following business day.  For prescription refill requests, have your pharmacy contact our office and allow 72 hours.    Cancer Center Support Programs:   > Cancer Support Group  2nd Tuesday of the month 1pm-2pm, Journey Room

## 2020-11-11 LAB — METHYLMALONIC ACID, SERUM: Methylmalonic Acid, Quantitative: 288 nmol/L (ref 0–378)

## 2020-11-23 ENCOUNTER — Telehealth (HOSPITAL_COMMUNITY): Payer: Self-pay | Admitting: Dietician

## 2020-11-23 ENCOUNTER — Inpatient Hospital Stay (HOSPITAL_COMMUNITY): Payer: 59 | Admitting: Dietician

## 2020-11-23 NOTE — Telephone Encounter (Signed)
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 She is currently not in treatment. Patient completed SBRT 5/19 for RUL   Patient reports scratchy throat, she thinks this is allergies. Taste is still off, sweets is only thing that taste as it should. She recently tried cantaloupe and watermelon and tolerated this well. Patient went to the beach a few weeks ago, says she ate out at a restaurant for the first time in 2 years. Patient went to [employer], had sirloin steak, shrimp and rice. She reports good appetite, she is weaning from Remeron, has 2 left. Still eats cereal for breakfast, snacks on peanut butter and apple, quesadilla for lunch, eats a light dinner followed by something sweet usually. Patient is pleased with her weight gain, says would like to weigh ~135 lb. Patient reports bowel movements are more regular, she continues to have "not so good days" on occasion.    Medications: reviewed  Labs: 8/11 reviewed  Anthropometrics: Last weight 123 lb 4.8 oz on 8/15 increased from 115 lb on 6/27 and 115 lb 12.8 on 6/2   NUTRITION DIAGNOSIS: Inadequate oral intake stable   INTERVENTION:  Continue eating high calorie, high protein foods for weight maintenance Discussed portion control with sweets Continue trying new foods as tolerated Patient has contact information    MONITORING, EVALUATION, GOAL: weight trends, intake   NEXT VISIT: No follow-up scheduled, patient will contact as needed

## 2020-12-07 ENCOUNTER — Encounter (HOSPITAL_COMMUNITY): Payer: Self-pay | Admitting: Hematology

## 2020-12-07 ENCOUNTER — Other Ambulatory Visit (HOSPITAL_COMMUNITY): Payer: Self-pay

## 2020-12-10 ENCOUNTER — Other Ambulatory Visit (HOSPITAL_COMMUNITY): Payer: Self-pay

## 2020-12-10 ENCOUNTER — Encounter (HOSPITAL_COMMUNITY): Payer: Self-pay | Admitting: Hematology

## 2020-12-16 ENCOUNTER — Other Ambulatory Visit (HOSPITAL_COMMUNITY): Payer: Self-pay

## 2020-12-16 ENCOUNTER — Encounter (HOSPITAL_COMMUNITY): Payer: Self-pay | Admitting: Hematology

## 2021-01-05 ENCOUNTER — Encounter (HOSPITAL_COMMUNITY)
Admission: RE | Admit: 2021-01-05 | Discharge: 2021-01-05 | Disposition: A | Discharge status: Home / Self Care | Payer: 59 | Source: Ambulatory Visit | Attending: General Surgery | Admitting: General Surgery

## 2021-01-05 ENCOUNTER — Encounter (HOSPITAL_COMMUNITY): Payer: Self-pay

## 2021-01-05 DIAGNOSIS — C20 Malignant neoplasm of rectum: Secondary | ICD-10-CM | POA: Insufficient documentation

## 2021-01-05 DIAGNOSIS — C787 Secondary malignant neoplasm of liver and intrahepatic bile duct: Secondary | ICD-10-CM | POA: Diagnosis not present

## 2021-01-05 MED ORDER — HEPARIN SOD (PORK) LOCK FLUSH 100 UNIT/ML IV SOLN
500.0000 [IU] | INTRAVENOUS | Status: AC | PRN
  Administered 2021-01-05: 500 [IU]

## 2021-01-05 MED ORDER — SODIUM CHLORIDE 0.9% FLUSH: 10.0000 mL | INTRAVENOUS | Status: DC | PRN

## 2021-01-05 MED ORDER — HEPARIN SOD (PORK) LOCK FLUSH 100 UNIT/ML IV SOLN
INTRAVENOUS | Status: AC
  Filled 2021-01-05: qty 5

## 2021-01-18 ENCOUNTER — Other Ambulatory Visit (HOSPITAL_COMMUNITY): Payer: Self-pay

## 2021-02-16 DIAGNOSIS — C20 Malignant neoplasm of rectum: Secondary | ICD-10-CM | POA: Diagnosis not present

## 2021-02-16 DIAGNOSIS — E063 Autoimmune thyroiditis: Secondary | ICD-10-CM | POA: Diagnosis not present

## 2021-02-16 DIAGNOSIS — C73 Malignant neoplasm of thyroid gland: Secondary | ICD-10-CM | POA: Diagnosis not present

## 2021-02-16 DIAGNOSIS — D511 Vitamin B12 deficiency anemia due to selective vitamin B12 malabsorption with proteinuria: Secondary | ICD-10-CM | POA: Diagnosis not present

## 2021-02-16 DIAGNOSIS — E890 Postprocedural hypothyroidism: Secondary | ICD-10-CM | POA: Diagnosis not present

## 2021-02-22 ENCOUNTER — Other Ambulatory Visit (HOSPITAL_COMMUNITY): Payer: Self-pay

## 2021-03-01 ENCOUNTER — Other Ambulatory Visit (INDEPENDENT_AMBULATORY_CARE_PROVIDER_SITE_OTHER): Payer: Self-pay

## 2021-03-01 DIAGNOSIS — Z1211 Encounter for screening for malignant neoplasm of colon: Secondary | ICD-10-CM

## 2021-03-02 ENCOUNTER — Encounter (HOSPITAL_COMMUNITY): Payer: Self-pay | Admitting: Hematology

## 2021-03-02 ENCOUNTER — Other Ambulatory Visit (HOSPITAL_COMMUNITY): Payer: Self-pay

## 2021-03-02 DIAGNOSIS — E890 Postprocedural hypothyroidism: Secondary | ICD-10-CM | POA: Diagnosis not present

## 2021-03-02 DIAGNOSIS — R636 Underweight: Secondary | ICD-10-CM | POA: Diagnosis not present

## 2021-03-02 DIAGNOSIS — E063 Autoimmune thyroiditis: Secondary | ICD-10-CM | POA: Diagnosis not present

## 2021-03-02 DIAGNOSIS — D511 Vitamin B12 deficiency anemia due to selective vitamin B12 malabsorption with proteinuria: Secondary | ICD-10-CM | POA: Diagnosis not present

## 2021-03-02 DIAGNOSIS — C73 Malignant neoplasm of thyroid gland: Secondary | ICD-10-CM | POA: Diagnosis not present

## 2021-03-02 DIAGNOSIS — C20 Malignant neoplasm of rectum: Secondary | ICD-10-CM | POA: Diagnosis not present

## 2021-03-02 MED ORDER — ARMOUR THYROID 60 MG PO TABS
ORAL_TABLET | ORAL | 1 refills | Status: DC
  Filled 2021-03-02: qty 120, 105d supply, fill #0
  Filled 2021-08-03: qty 120, 90d supply, fill #1

## 2021-03-04 ENCOUNTER — Encounter (INDEPENDENT_AMBULATORY_CARE_PROVIDER_SITE_OTHER): Payer: Self-pay

## 2021-04-08 ENCOUNTER — Encounter (HOSPITAL_COMMUNITY): Admission: RE | Admit: 2021-04-08 | Payer: 59 | Source: Ambulatory Visit

## 2021-04-16 ENCOUNTER — Other Ambulatory Visit: Payer: Self-pay | Admitting: Family Medicine

## 2021-04-16 ENCOUNTER — Encounter: Payer: Self-pay | Admitting: Family Medicine

## 2021-04-16 ENCOUNTER — Other Ambulatory Visit (HOSPITAL_COMMUNITY): Payer: Self-pay

## 2021-04-16 MED ORDER — ALPRAZOLAM 0.5 MG PO TABS
0.5000 mg | ORAL_TABLET | Freq: Three times a day (TID) | ORAL | 1 refills | Status: DC | PRN
  Filled 2021-04-16: qty 90, 30d supply, fill #0
  Filled 2021-06-01: qty 90, 30d supply, fill #1

## 2021-04-27 DIAGNOSIS — H524 Presbyopia: Secondary | ICD-10-CM | POA: Diagnosis not present

## 2021-04-27 DIAGNOSIS — H5213 Myopia, bilateral: Secondary | ICD-10-CM | POA: Diagnosis not present

## 2021-04-28 ENCOUNTER — Other Ambulatory Visit (HOSPITAL_COMMUNITY): Payer: Self-pay

## 2021-04-28 DIAGNOSIS — D492 Neoplasm of unspecified behavior of bone, soft tissue, and skin: Secondary | ICD-10-CM | POA: Diagnosis not present

## 2021-04-28 MED ORDER — ERYTHROMYCIN 5 MG/GM OP OINT
TOPICAL_OINTMENT | OPHTHALMIC | 0 refills | Status: DC
  Filled 2021-04-28: qty 3.5, 7d supply, fill #0

## 2021-05-04 NOTE — Progress Notes (Signed)
Reviewed at 11/09/20 OV by Dr. Delton Coombes

## 2021-05-11 ENCOUNTER — Encounter (HOSPITAL_COMMUNITY)
Admission: RE | Admit: 2021-05-11 | Discharge: 2021-05-11 | Disposition: A | Discharge status: Home / Self Care | Payer: 59 | Source: Ambulatory Visit | Attending: General Surgery | Admitting: General Surgery

## 2021-05-11 DIAGNOSIS — Z452 Encounter for adjustment and management of vascular access device: Secondary | ICD-10-CM | POA: Insufficient documentation

## 2021-05-11 DIAGNOSIS — C20 Malignant neoplasm of rectum: Secondary | ICD-10-CM | POA: Diagnosis not present

## 2021-05-11 DIAGNOSIS — C787 Secondary malignant neoplasm of liver and intrahepatic bile duct: Secondary | ICD-10-CM | POA: Insufficient documentation

## 2021-05-11 LAB — CBC WITH DIFFERENTIAL/PLATELET
Abs Immature Granulocytes: 0.01 10*3/uL (ref 0.00–0.07)
Basophils Absolute: 0 10*3/uL (ref 0.0–0.1)
Basophils Relative: 0 %
Eosinophils Absolute: 0 10*3/uL (ref 0.0–0.5)
Eosinophils Relative: 0 %
HCT: 35 % — ABNORMAL LOW (ref 36.0–46.0)
Hemoglobin: 12.1 g/dL (ref 12.0–15.0)
Immature Granulocytes: 0 %
Lymphocytes Relative: 16 %
Lymphs Abs: 0.5 10*3/uL — ABNORMAL LOW (ref 0.7–4.0)
MCH: 32.4 pg (ref 26.0–34.0)
MCHC: 34.6 g/dL (ref 30.0–36.0)
MCV: 93.6 fL (ref 80.0–100.0)
Monocytes Absolute: 0.3 10*3/uL (ref 0.1–1.0)
Monocytes Relative: 9 %
Neutro Abs: 2.2 10*3/uL (ref 1.7–7.7)
Neutrophils Relative %: 75 %
Platelets: 100 10*3/uL — ABNORMAL LOW (ref 150–400)
RBC: 3.74 MIL/uL — ABNORMAL LOW (ref 3.87–5.11)
RDW: 12.9 % (ref 11.5–15.5)
WBC: 3 10*3/uL — ABNORMAL LOW (ref 4.0–10.5)
nRBC: 0 % (ref 0.0–0.2)

## 2021-05-11 LAB — COMPREHENSIVE METABOLIC PANEL
ALT: 12 U/L (ref 0–44)
AST: 21 U/L (ref 15–41)
Albumin: 4.2 g/dL (ref 3.5–5.0)
Alkaline Phosphatase: 106 U/L (ref 38–126)
Anion gap: 12 (ref 5–15)
BUN: 17 mg/dL (ref 6–20)
CO2: 21 mmol/L — ABNORMAL LOW (ref 22–32)
Calcium: 8.7 mg/dL — ABNORMAL LOW (ref 8.9–10.3)
Chloride: 102 mmol/L (ref 98–111)
Creatinine, Ser: 0.83 mg/dL (ref 0.44–1.00)
GFR, Estimated: >60 mL/min (ref >60)
Glucose, Bld: 91 mg/dL (ref 70–99)
Potassium: 3.3 mmol/L — ABNORMAL LOW (ref 3.5–5.1)
Sodium: 135 mmol/L (ref 135–145)
Total Bilirubin: 2.7 mg/dL — ABNORMAL HIGH (ref 0.3–1.2)
Total Protein: 7.3 g/dL (ref 6.5–8.1)

## 2021-05-11 MED ORDER — HEPARIN SOD (PORK) LOCK FLUSH 100 UNIT/ML IV SOLN
500.0000 [IU] | INTRAVENOUS | Status: AC | PRN
  Administered 2021-05-11: 500 [IU]
  Filled 2021-05-11: qty 5

## 2021-05-11 NOTE — Progress Notes (Signed)
Blood drawn and sent to lab for results. 

## 2021-05-12 ENCOUNTER — Ambulatory Visit (HOSPITAL_COMMUNITY)
Admission: RE | Admit: 2021-05-12 | Discharge: 2021-05-12 | Disposition: A | Discharge status: Home / Self Care | Payer: 59 | Source: Ambulatory Visit | Attending: Hematology | Admitting: Hematology

## 2021-05-12 ENCOUNTER — Other Ambulatory Visit: Payer: Self-pay

## 2021-05-12 DIAGNOSIS — D181 Lymphangioma, any site: Secondary | ICD-10-CM | POA: Diagnosis not present

## 2021-05-12 DIAGNOSIS — K7689 Other specified diseases of liver: Secondary | ICD-10-CM | POA: Diagnosis not present

## 2021-05-12 DIAGNOSIS — C189 Malignant neoplasm of colon, unspecified: Secondary | ICD-10-CM | POA: Diagnosis not present

## 2021-05-12 DIAGNOSIS — C787 Secondary malignant neoplasm of liver and intrahepatic bile duct: Secondary | ICD-10-CM | POA: Diagnosis not present

## 2021-05-12 DIAGNOSIS — Z85850 Personal history of malignant neoplasm of thyroid: Secondary | ICD-10-CM | POA: Diagnosis not present

## 2021-05-12 DIAGNOSIS — C7800 Secondary malignant neoplasm of unspecified lung: Secondary | ICD-10-CM | POA: Diagnosis not present

## 2021-05-12 DIAGNOSIS — J9809 Other diseases of bronchus, not elsewhere classified: Secondary | ICD-10-CM | POA: Diagnosis not present

## 2021-05-12 DIAGNOSIS — I2510 Atherosclerotic heart disease of native coronary artery without angina pectoris: Secondary | ICD-10-CM | POA: Diagnosis not present

## 2021-05-12 LAB — CEA: CEA: 1.7 ng/mL (ref 0.0–4.7)

## 2021-05-12 MED ORDER — IOHEXOL 300 MG/ML  SOLN
100.0000 mL | Freq: Once | INTRAMUSCULAR | Status: AC | PRN
  Administered 2021-05-12: 100 mL via INTRAVENOUS

## 2021-05-12 MED ORDER — IOHEXOL 9 MG/ML PO SOLN
ORAL | Status: AC
  Filled 2021-05-12: qty 1000

## 2021-05-17 ENCOUNTER — Encounter (HOSPITAL_COMMUNITY): Payer: Self-pay | Admitting: Hematology

## 2021-05-17 ENCOUNTER — Other Ambulatory Visit: Payer: Self-pay

## 2021-05-17 ENCOUNTER — Inpatient Hospital Stay (HOSPITAL_COMMUNITY): Payer: 59 | Attending: Hematology | Admitting: Hematology

## 2021-05-17 VITALS — BP 127/82 | HR 102 | Temp 98.0°F | Resp 18 | Wt 127.1 lb

## 2021-05-17 DIAGNOSIS — J3489 Other specified disorders of nose and nasal sinuses: Secondary | ICD-10-CM | POA: Diagnosis not present

## 2021-05-17 DIAGNOSIS — J984 Other disorders of lung: Secondary | ICD-10-CM | POA: Diagnosis not present

## 2021-05-17 DIAGNOSIS — I7121 Aneurysm of the ascending aorta, without rupture: Secondary | ICD-10-CM | POA: Insufficient documentation

## 2021-05-17 DIAGNOSIS — C19 Malignant neoplasm of rectosigmoid junction: Secondary | ICD-10-CM

## 2021-05-17 DIAGNOSIS — Z880 Allergy status to penicillin: Secondary | ICD-10-CM | POA: Insufficient documentation

## 2021-05-17 DIAGNOSIS — Z8249 Family history of ischemic heart disease and other diseases of the circulatory system: Secondary | ICD-10-CM | POA: Insufficient documentation

## 2021-05-17 DIAGNOSIS — E039 Hypothyroidism, unspecified: Secondary | ICD-10-CM | POA: Insufficient documentation

## 2021-05-17 DIAGNOSIS — D696 Thrombocytopenia, unspecified: Secondary | ICD-10-CM | POA: Diagnosis not present

## 2021-05-17 DIAGNOSIS — Z79899 Other long term (current) drug therapy: Secondary | ICD-10-CM | POA: Insufficient documentation

## 2021-05-17 DIAGNOSIS — Z87891 Personal history of nicotine dependence: Secondary | ICD-10-CM | POA: Insufficient documentation

## 2021-05-17 DIAGNOSIS — C20 Malignant neoplasm of rectum: Secondary | ICD-10-CM | POA: Diagnosis not present

## 2021-05-17 DIAGNOSIS — Z9049 Acquired absence of other specified parts of digestive tract: Secondary | ICD-10-CM | POA: Insufficient documentation

## 2021-05-17 DIAGNOSIS — D72819 Decreased white blood cell count, unspecified: Secondary | ICD-10-CM | POA: Insufficient documentation

## 2021-05-17 DIAGNOSIS — R200 Anesthesia of skin: Secondary | ICD-10-CM | POA: Diagnosis not present

## 2021-05-17 DIAGNOSIS — Z881 Allergy status to other antibiotic agents status: Secondary | ICD-10-CM | POA: Diagnosis not present

## 2021-05-17 DIAGNOSIS — R058 Other specified cough: Secondary | ICD-10-CM | POA: Insufficient documentation

## 2021-05-17 DIAGNOSIS — C189 Malignant neoplasm of colon, unspecified: Secondary | ICD-10-CM

## 2021-05-17 DIAGNOSIS — M549 Dorsalgia, unspecified: Secondary | ICD-10-CM | POA: Insufficient documentation

## 2021-05-17 DIAGNOSIS — R634 Abnormal weight loss: Secondary | ICD-10-CM | POA: Diagnosis not present

## 2021-05-17 DIAGNOSIS — R161 Splenomegaly, not elsewhere classified: Secondary | ICD-10-CM | POA: Insufficient documentation

## 2021-05-17 DIAGNOSIS — Z9221 Personal history of antineoplastic chemotherapy: Secondary | ICD-10-CM | POA: Diagnosis not present

## 2021-05-17 DIAGNOSIS — G35 Multiple sclerosis: Secondary | ICD-10-CM | POA: Diagnosis not present

## 2021-05-17 DIAGNOSIS — R109 Unspecified abdominal pain: Secondary | ICD-10-CM | POA: Insufficient documentation

## 2021-05-17 DIAGNOSIS — I700 Atherosclerosis of aorta: Secondary | ICD-10-CM | POA: Diagnosis not present

## 2021-05-17 DIAGNOSIS — Z801 Family history of malignant neoplasm of trachea, bronchus and lung: Secondary | ICD-10-CM | POA: Insufficient documentation

## 2021-05-17 DIAGNOSIS — Z808 Family history of malignant neoplasm of other organs or systems: Secondary | ICD-10-CM | POA: Insufficient documentation

## 2021-05-17 DIAGNOSIS — Z85850 Personal history of malignant neoplasm of thyroid: Secondary | ICD-10-CM | POA: Insufficient documentation

## 2021-05-17 DIAGNOSIS — Z885 Allergy status to narcotic agent status: Secondary | ICD-10-CM | POA: Insufficient documentation

## 2021-05-17 DIAGNOSIS — Z923 Personal history of irradiation: Secondary | ICD-10-CM | POA: Insufficient documentation

## 2021-05-17 DIAGNOSIS — C787 Secondary malignant neoplasm of liver and intrahepatic bile duct: Secondary | ICD-10-CM

## 2021-05-17 DIAGNOSIS — Z8042 Family history of malignant neoplasm of prostate: Secondary | ICD-10-CM | POA: Insufficient documentation

## 2021-05-17 MED ORDER — MIRTAZAPINE 15 MG PO TABS
15.0000 mg | ORAL_TABLET | Freq: Every day | ORAL | 3 refills | Status: DC
  Filled 2021-05-17: qty 30, 30d supply, fill #0

## 2021-05-17 NOTE — Patient Instructions (Addendum)
[place] at [facility] Discharge Instructions  You were seen and examined today by Dr. Delton Coombes. He reviewed your most recent labs and scan. The scan is showing some consolidation in your left lung and a lymph node near where the trachea splits into the bronchus on the left side. The consolidation could be that you have pneumonia or infection going on. The lymph node is the concerning find but this can also be due to infection. Dr. Delton Coombes recommends a repeat scan in 3 months. If you have any changes in the pain or no improvement of pain in the next 3-4 weeks please let us know and we will set you up for a scan sooner. Please keep follow up appointments a scheduled.   Thank you for choosing [place] at [facility] to provide your oncology and hematology care.  To afford each patient quality time with our provider, please arrive at least 15 minutes before your scheduled appointment time.   If you have a lab appointment with the [facility] please come in thru the Main Entrance and check in at the main information desk.  You need to re-schedule your appointment should you arrive 10 or more minutes late.  We strive to give you quality time with our providers, and arriving late affects you and other patients whose appointments are after yours.  Also, if you no show three or more times for appointments you may be dismissed from the clinic at the providers discretion.     Again, thank you for choosing [facility].  Our hope is that these requests will decrease the amount of time that you wait before being seen by our physicians.       _____________________________________________________________  Should you have questions after your visit to [facility], please contact our office at [phone] and follow the prompts.  Our office hours are 8:00 a.m. and 4:30 p.m. Monday - Friday.  Please note that voicemails left after  4:00 p.m. may not be returned until the following business day.  We are closed weekends and major holidays.  You do have access to a nurse 24-7, just call the main number to the clinic [phone] and do not press any options, hold on the line and a nurse will answer the phone.    For prescription refill requests, have your pharmacy contact our office and allow 72 hours.    Due to Covid, you will need to wear a mask upon entering the hospital. If you do not have a mask, a mask will be given to you at the Main Entrance upon arrival. For doctor visits, patients may have 1 support person age 84 or older with them. For treatment visits, patients can not have anyone with them due to social distancing guidelines and our immunocompromised population.

## 2021-05-17 NOTE — Progress Notes (Signed)
[facility] 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 FOLFOX & vectibix x 7 cycles from 03/14/2018 to 06/27/2018. 2. XRT with Xeloda from 07/30/2018 to 09/06/2018. 3. Right liver lesion microwave ablation on 10/15/2018. 4. Low anterior resection and diverting ileostomy on 12/07/2018. 5. SBRT to liver 60 Gy in 5 fractions from 11/15/2019 to 12/13/2019.  NGS Results: Foundation 1 K-RAS/NRAS wild-type, MS--stable, TMB--low  CURRENT THERAPY: Surveillance  BRIEF ONCOLOGIC HISTORY:  Oncology History  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HCC)  02/26/2018 Initial Diagnosis   Rectal cancer (HCC)   03/13/2018 Cancer Staging   Staging form: Colon and Rectum, AJCC 8th Edition - Clinical stage from 03/13/2018: Stage IVA (cT3, cN1b, cM1a) - Signed by [name], MD on 03/13/2018    03/14/2018 - 06/29/2018 Chemotherapy   The patient had palonosetron (ALOXI) injection 0.25 mg, 0.25 mg, Intravenous,  Once, 7 of 8 cycles Administration: 0.25 mg (03/14/2018), 0.25 mg (03/27/2018), 0.25 mg (04/18/2018), 0.25 mg (05/02/2018), 0.25 mg (05/16/2018), 0.25 mg (06/05/2018), 0.25 mg (06/27/2018) leucovorin 800 mg in dextrose 5 % 250 mL infusion, 772 mg, Intravenous,  Once, 7 of 8 cycles Administration: 800 mg (03/14/2018), 800 mg (03/27/2018), 800 mg (05/02/2018), 800 mg (05/16/2018), 700 mg (04/18/2018), 800 mg (06/05/2018), 800 mg (06/27/2018) oxaliplatin (ELOXATIN) 165 mg in dextrose 5 % 500 mL chemo infusion, 85 mg/m2 = 165 mg, Intravenous,  Once, 6 of 7 cycles Dose modification: 68 mg/m2 (80 % of original dose 85 mg/m2, Cycle 4, Reason: Provider Judgment) Administration: 165 mg (03/14/2018), 165 mg (03/27/2018), 130 mg (05/02/2018), 130 mg (05/16/2018), 130 mg (06/05/2018), 130 mg  (06/27/2018) panitumumab (VECTIBIX) 500 mg in sodium chloride 0.9 % 100 mL chemo infusion, 480 mg, Intravenous,  Once, 4 of 5 cycles Administration: 500 mg (04/18/2018), 480 mg (05/02/2018), 480 mg (05/16/2018), 480 mg (06/05/2018) fluorouracil (ADRUCIL) chemo injection 750 mg, 400 mg/m2 = 750 mg (100 % of original dose 400 mg/m2), Intravenous,  Once, 6 of 7 cycles Dose modification: 400 mg/m2 (original dose 400 mg/m2, Cycle 1), 400 mg/m2 (original dose 400 mg/m2, Cycle 3) Administration: 750 mg (03/14/2018), 750 mg (04/18/2018), 750 mg (05/02/2018), 750 mg (05/16/2018), 750 mg (06/05/2018), 750 mg (06/27/2018) fosaprepitant (EMEND) 150 mg, dexamethasone (DECADRON) 12 mg in sodium chloride 0.9 % 145 mL IVPB, , Intravenous,  Once, 4 of 5 cycles Administration:  (05/02/2018),  (05/16/2018),  (06/05/2018),  (06/27/2018) fluorouracil (ADRUCIL) 4,650 mg in sodium chloride 0.9 % 57 mL chemo infusion, 2,400 mg/m2 = 4,650 mg, Intravenous, 1 Day/Dose, 7 of 8 cycles Administration: 4,650 mg (03/14/2018), 4,650 mg (03/27/2018), 4,650 mg (04/18/2018), 4,650 mg (05/02/2018), 4,650 mg (05/16/2018), 4,650 mg (06/05/2018), 4,650 mg (06/27/2018)   for chemotherapy treatment.       CANCER STAGING: Cancer Staging  Malignant neoplasm of rectum [facility]) Staging form: Colon and Rectum, AJCC 8th Edition - Clinical stage from 03/13/2018: Stage IVA (cT3, cN1b, cM1a) - Signed by [name], MD on 03/13/2018   INTERVAL HISTORY:  [name], a 56 y.o. 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 Samantha [facility] was last seen on 11/09/2020.   Today she reports feeling good. She denies recent infections. She reports rhinorrhea with clear mucous as well as occasional cough productive of clear sputum. She reports  pulling back pain starting 3-4 days ago which radiates to her left side and is worsened with deep inspiration; she reports this pain has improved over the past 2 days. She reports her mother was  released from the [facility] last week with pneumonia and that they are around each other frequently. Her appetite is poor. She has gained 4 lbs since her last visit.   REVIEW OF SYSTEMS:  Review of Systems  Constitutional:  Positive for appetite change. Negative for fatigue and unexpected weight change (+4 lbs).  Musculoskeletal:  Positive for back pain and flank pain (L side).  Neurological:  Positive for numbness.  All other systems reviewed and are negative.  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   Worse in her 35s when she was ill   H/O Hashimoto thyroiditis    History of chemotherapy    History of radiation therapy    History of thyroid cancer no recurrence   2013--  s/p  left lobe thyroidectomy--  follicular varient papillary / lymphocyctic thyroiditis   Hyperlipidemia 11/09/2015   Hypothyroidism    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[place]) 02/26/2018   Past Surgical History:  Procedure Laterality Date   BIOPSY  02/19/2018   Procedure: BIOPSY;  Surgeon: Rogene Houston, MD;  [facility];  Service: Endoscopy;;  rectum   COLONOSCOPY N/A 08/20/2015   Procedure: COLONOSCOPY;  Surgeon: Rogene Houston, MD;  [facility];  Service: Endoscopy;  Laterality: N/A;  [place] ENDOMETRIAL ABLATION  08-13-2004   DIVERTING ILEOSTOMY N/A 12/07/2018   Procedure: DIVERTING LOOP ILEOSTOMY;  Surgeon: Leighton Ruff, MD;  [facility]: [facility];  Service: General;  Laterality: N/A;   FLEXIBLE SIGMOIDOSCOPY N/A 02/19/2018   Procedure: FLEXIBLE SIGMOIDOSCOPY;  Surgeon: Rogene Houston, MD;  [facility];  Service: Endoscopy;  Laterality: N/A;   HYSTEROSCOPY WITH D & C N/A 04/30/2015   Procedure: DILATATION AND CURETTAGE / INTENDED  HYSTEROSCOPY;  Surgeon: Dian Queen, MD;  Location: [facility];  Service: Gynecology;  Laterality: N/A;   ILEOSTOMY CLOSURE N/A 04/17/2019   Procedure: LOOP ILEOSTOMY REVERSAL;  Surgeon: Leighton Ruff, MD;  [facility];  Service: General;  Laterality: N/A;   IR US GUIDE BX ASP/DRAIN  03/09/2018   LAPAROSCOPIC CHOLECYSTECTOMY  04/1996   LAPAROSCOPY N/A 12/11/2018   Procedure: LAPAROSCOPY  ILEOSTOMY REVISION AND ABDOMINAL WASHOUT;  Surgeon: Leighton Ruff, MD;  [facility];  Service: General;  Laterality: N/A;   Liver Microwave   10/17/2018   POLYPECTOMY  08/20/2015   Procedure: POLYPECTOMY;  Surgeon: Rogene Houston, MD;  [facility];  Service: Endoscopy;;  Splenic flexure polypectomy   POLYPECTOMY  02/19/2018   Procedure: POLYPECTOMY;  Surgeon: Rogene Houston, MD;  [facility];  Service: Endoscopy;;  rectum   PORTACATH PLACEMENT N/A 03/14/2018   Procedure: INSERTION PORT-A-CATH;  Surgeon: Aviva Signs, MD;  Location: [facility];  Service: General;  Laterality: N/A;   REDUCTION INCARCERATED UTERUS  06-20-2000   intrauterine preg. 13 wks /  urinary retention   THYROID LOBECTOMY  11/24/2011   Procedure: THYROID LOBECTOMY;  Surgeon: Earnstine Regal, MD;  [facility];  Service: General;  Laterality: Left;  Left Thyroid Lobectomy   TUBAL LIGATION  2002   XI ROBOTIC ASSISTED LOWER ANTERIOR RESECTION N/A 12/07/2018   Procedure: XI ROBOTIC ASSISTED LOWER ANTERIOR RESECTION, SPENIC FLEXURE IMMOBILIZATION, COLOANAL ANASTOMOSIS, RIGID PROCTOSCOPY;  Surgeon: Leighton Ruff, MD;  [facility];  Service: General;  Laterality: N/A;    SOCIAL HISTORY:  Social History   Socioeconomic History   Marital status: Married    Spouse name: Not on file   Number of children: 4   Years of education: Not on file   Highest education level: Not on file  Occupational History    Comment: Marketing executive at [employer]   Smoking status: Former     Packs/day: 0.25    Years: 10.00    Pack years: 2.50    Types: Cigarettes    Quit date: 10/31/1991    Years since quitting: 29.5   Smokeless tobacco: Never  Vaping Use   Vaping Use: Never used  Substance and Sexual Activity   Alcohol use: No   Drug use: No   Sexual activity: Not Currently  Other Topics Concern   Not on file  Social History Narrative   Lives with husband [name] and 8 year old son [name]   Husband is Interior and spatial designer of [employer]   Social Determinants of Radio broadcast assistant Strain: Not on file  Food Insecurity: Not on file  Transportation Needs: Not on file  Physical Activity: Not on file  Stress: Not on file  Social Connections: Not on file  Intimate Partner Violence: Not on file    FAMILY HISTORY:  Family History  Problem Relation Age of Onset   Hypertension Mother    Hypertension Father    Prostate cancer Father    Cancer Maternal Aunt        spine/back   Cancer Paternal Grandfather        lung   Healthy Son    Healthy Son    Healthy Son    Healthy Son    Colon cancer Neg Hx     CURRENT MEDICATIONS:  Current Outpatient Medications  Medication Sig Dispense Refill   ALPRAZolam (XANAX) 0.5 MG tablet Take 1 tablet (0.5 mg total) by mouth 3 (three) times daily as needed for anxiety. 90 tablet 1   amitriptyline (ELAVIL) 10 MG tablet Take 1 tablet (10 mg total) by mouth 2 (two) times daily. 180 tablet 1   ARMOUR THYROID 60 MG tablet TAKE 1 TABLET BY MOUTH ONCE DAILY. (Patient not taking: Reported on 11/09/2020) 90 tablet 1   ARMOUR THYROID 60 MG tablet Take 1 tablet (60 mg total) by mouth daily except take 1.5 tablets on Mon. and Thurs. (Patient not taking: Reported on 03/30/2021) 120 tablet 1   ARMOUR THYROID 60 MG tablet Take 1 tablet by mouth  daily except on Monday & Thursday take 1 & 1/2 tablets (Patient taking differently: Take 60 mg by mouth daily before breakfast.) 120 tablet 1   COVID-19 At Home Antigen Test (CARESTART COVID-19 HOME TEST) KIT  Use as directed (Patient not taking: Reported on 11/09/2020) 4 each 0   erythromycin ophthalmic ointment Apply a small amount into right eye twice a day for 1 week 3.5 g 0   loperamide (IMODIUM) 2 MG capsule Take 1 capsule (2 mg total) by mouth 3 (three) times daily. (Patient taking differently: Take 2 mg by mouth daily.) 30 capsule 0   magic mouthwash w/lidocaine SOLN 1 Part viscous lidocaine 2% 1 Part  Maalox 1 Part diphenhydramine 12.5 mg per 5 ml elixir Quantity: 120 ml  Sig: Swish, gargle, and spit one to two teaspoonfuls every six hours as needed. Shake well before using. (Patient not taking: Reported on 11/09/2020) 120 mL 0   mirtazapine (REMERON) 15 MG tablet Take 1 tablet (15 mg total) by mouth at bedtime. (Patient not taking: Reported on 03/30/2021) 30 tablet 3   prochlorperazine (COMPAZINE) 10 MG tablet Take 1 tablet (10 mg total) by mouth every 6 (six) hours as needed for nausea or vomiting. 60 tablet 3   No current facility-administered medications for this visit.   Facility-Administered Medications Ordered in Other Visits  Medication Dose Route Frequency Provider Last Rate Last Admin   clindamycin (CLEOCIN) 900 mg in dextrose 5 % 50 mL IVPB  900 mg Intravenous 60 min Pre-Op Leighton Ruff, MD       And   gentamicin (GARAMYCIN) 350 mg in dextrose 5 % 50 mL IVPB  5 mg/kg Intravenous 60 min Pre-Op Leighton Ruff, MD       clindamycin (CLEOCIN) 900 mg in dextrose 5 % 50 mL IVPB  900 mg Intravenous 60 min Pre-Op Leighton Ruff, MD       And   gentamicin (GARAMYCIN) 5 mg/kg in dextrose 5 % 50 mL IVPB  5 mg/kg Intravenous 60 min Pre-Op Leighton Ruff, MD       sodium chloride 0.9 % 1,000 mL with potassium chloride 20 mEq, magnesium sulfate 2 g infusion   Intravenous Once Derek Jack, MD        ALLERGIES:  Allergies  Allergen Reactions   Demerol [Meperidine] Itching    All over the body.   Penicillins Hives     childhood, does not remember if it spread all over the body or  not. Did it involve swelling of the face/tongue/throat, SOB, or low BP? No Did it involve sudden or severe rash/hives, skin peeling, or any reaction on the inside of your mouth or nose? Yes Did you need to seek medical attention at a [facility] or doctor's office? Yes When did it last happen?      childhood allergy If all above answers are NO, may proceed with cephalosporin use.    Other Hives and Other (See Comments)    Fresh coconut    Vancomycin Rash    Will need Benadryl prior to administration per IV. "Red man syndrome"    PHYSICAL EXAM:  Performance status (ECOG): 0 - Asymptomatic  There were no vitals filed for this visit. Wt Readings from Last 3 Encounters:  11/09/20 123 lb 4.8 oz (55.9 kg)  09/21/20 115 lb (52.2 kg)  08/27/20 115 lb 12.8 oz (52.5 kg)   Physical Exam Vitals reviewed.  Constitutional:      Appearance: Normal appearance.  Cardiovascular:     Rate and Rhythm: Normal rate and regular rhythm.     Pulses: Normal pulses.     Heart sounds: Normal heart sounds.  Pulmonary:     Effort: Pulmonary effort is normal.     Breath sounds: Normal breath sounds.  Musculoskeletal:     Thoracic back: No tenderness.  Neurological:     General: No focal deficit present.     Mental Status: She is alert and oriented to person, place, and time.  Psychiatric:        Mood and Affect: Mood normal.        Behavior: Behavior normal.     LABORATORY DATA:  I have reviewed the labs as  listed.  CBC Latest Ref Rng & Units 05/11/2021 11/05/2020 07/02/2020  WBC 4.0 - 10.5 K/uL 3.0(L) 2.1(L) 2.7(L)  Hemoglobin 12.0 - 15.0 g/dL 12.1 10.7(L) 11.5(L)  Hematocrit 36.0 - 46.0 % 35.0(L) 30.6(L) 32.9(L)  Platelets 150 - 400 K/uL 100(L) 83(L) 117(L)   CMP Latest Ref Rng & Units 05/11/2021 11/05/2020 07/02/2020  Glucose 70 - 99 mg/dL 91 86 108(H)  BUN 6 - 20 mg/dL $Remove'17 13 19  'ujfrROF$ Creatinine 0.44 - 1.00 mg/dL 0.83 0.84 0.79  Sodium 135 - 145 mmol/L 135 135 137  Potassium 3.5 - 5.1 mmol/L 3.3(L) 3.5  3.7  Chloride 98 - 111 mmol/L 102 105 104  CO2 22 - 32 mmol/L 21(L) 25 24  Calcium 8.9 - 10.3 mg/dL 8.7(L) 8.4(L) 8.7(L)  Total Protein 6.5 - 8.1 g/dL 7.3 6.6 7.3  Total Bilirubin 0.3 - 1.2 mg/dL 2.7(H) 2.8(H) 1.9(H)  Alkaline Phos 38 - 126 U/L 106 78 94  AST 15 - 41 U/L $Remo'21 19 21  'INWEE$ ALT 0 - 44 U/L $Remo'12 9 12    'TtGHf$ DIAGNOSTIC IMAGING:  I have independently reviewed the scans and discussed with the patient. CT CHEST ABDOMEN PELVIS W CONTRAST  Result Date: 05/13/2021 CLINICAL DATA:  Metastatic colon cancer surveillance. Rectal cancer 2019 status post resection and diverting ileostomy. Chemotherapy and radiation therapy with microwave ablation. History of thyroid cancer. EXAM: CT CHEST, ABDOMEN, AND PELVIS WITH CONTRAST TECHNIQUE: Multidetector CT imaging of the chest, abdomen and pelvis was performed following the standard protocol during bolus administration of intravenous contrast. RADIATION DOSE REDUCTION: This exam was performed according to the departmental dose-optimization program which includes automated exposure control, adjustment of the mA and/or kV according to patient size and/or use of iterative reconstruction technique. CONTRAST:  134mL OMNIPAQUE IOHEXOL 300 MG/ML  SOLN COMPARISON:  11/06/2020.  MR abdomen 11/05/2019. FINDINGS: CT CHEST FINDINGS Cardiovascular: Ascending aorta measures up to 4.4 cm, stable. Coronary artery calcification. Heart size normal. No pericardial effusion. Mediastinum/Nodes: Left thyroidectomy. Subcarinal lymph node measures 10 mm, previously 4 mm. No hilar or axillary adenopathy. Esophagus is unremarkable. Lungs/Pleura: Biapical pleuroparenchymal scarring. New architectural distortion and ground-glass in the medial aspect of the apical segment right upper lobe (3/36), at the site of previously seen 7 mm nodule, indicative of interval radiation therapy. New subpleural consolidation in the upper most aspect of the superior segment left lower lobe (3/52). Central nodule in  the superior segment left lower lobe measures 1.9 x 2.6 cm (3/82) with associated mucoid impaction and peribronchovascular nodularity in the left lower lobe due to bronchial narrowing/obstruction. Increasing subpleural nodularity in the posteromedial left lower lobe (3/95-115). Pleuroparenchymal scarring in the posteromedial left lower lobe, presumably a treated metastasis. No pleural fluid. Airway is otherwise unremarkable. Musculoskeletal: No worrisome lytic or sclerotic lesions. CT ABDOMEN PELVIS FINDINGS Hepatobiliary: Low-attenuation lesions in the right hepatic lobe measure up to 1.5 x 3.1 cm inferiorly, stable. Cholecystectomy. No unexpected biliary ductal dilatation. Pancreas: Negative. Spleen: Slightly enlarged, 13.8 cm. Low-attenuation lesion in the anterolateral spleen measures 1.8 cm, unchanged and thought to represent a lymphangioma on 11/05/2019. Adrenals/Urinary Tract: Adrenal glands and kidneys are unremarkable. Ureters are decompressed. Bladder is grossly unremarkable. Stomach/Bowel: Stomach and small bowel are unremarkable. Low anterior resection with an anastomosis in the distal colon. Colon is otherwise unremarkable. Appendix is not readily visualized. Vascular/Lymphatic: Atherosclerotic calcification of the aorta. No pathologically enlarged lymph nodes. Reproductive: Uterus is visualized.  No adnexal mass. Other: Mild presacral edema. No free fluid. Mesenteries and peritoneum are unremarkable. Musculoskeletal: Degenerative  changes in the spine. No worrisome lytic or sclerotic lesions. IMPRESSION: 1. Progression in pulmonary metastatic disease, namely in the left lower lobe. Likely associated increase in size of a subcarinal lymph node. Narrowing/occlusion of the left lower lobe superior segmental bronchus. 2. Presumed interval treatment of an apical segment right upper lobe metastasis. Stable treated liver metastases. 3. Splenomegaly. 4. Ascending aortic aneurysm, stable Recommend annual imaging  followup by CTA or MRA. This recommendation follows 2010 ACCF/AHA/AATS/ACR/ASA/SCA/SCAI/SIR/STS/SVM Guidelines for the Diagnosis and Management of Patients with Thoracic Aortic Disease. Circulation. 2010; 121: Q761-P509. Aortic aneurysm NOS (ICD10-I71.9). 5. Aortic atherosclerosis (ICD10-I70.0). Coronary artery calcification. Electronically Signed   By: Lorin Picket M.D.   On: 05/13/2021 10:56     ASSESSMENT:  1.  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  Liver biopsy on 03/09/2018 consistent with adenocarcinoma. 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 -7 cycles of FOLFOX with vectibix completed on 06/27/2018. -XRT with Xeloda from 07/30/2018 through 09/06/2018. -Right liver lesion microwave ablation on 10/15/2018 at [facility]. -Low anterior resection and diverting ileostomy on 12/07/2018, pathology [id number], 0/6 lymph nodes positive, margins negative. -Loop ileostomy reversal on 04/17/2019. -CTAP on 07/10/2019 showed left lower lobe pulmonary nodule increased by 1 mm, measuring 8 mm.  3 mm left upper lobe lung nodule is unchanged.  Probable 4 mm right apical pulmonary nodule felt to be enlarged from 2 mm from October 2020.  Vague hypoattenuation within the posterior right hepatic lobe measures 1.5 cm, measuring 1.2 cm in October 2020.  Subtle subcapsular right hepatic lobe hypoattenuating 1 cm lesion was not seen on prior scans. -We reviewed results of MRI of the abdomen with and without contrast from 07/16/2019.  2 enhancing lesions in the right hepatic lobe, largest one measuring 1.5 x 1.0 cm.  Inferior lesion is measured as 1 cm.  Stable splenomegaly. -CEA is 2.4. -Microwave ablation of the right hepatic lobe lesion by Dr. Maudie Mercury around 08/15/2019. -SBRT to the left lower lobe lung lesion and left liver lobe lesion on 12/13/2019 at [facility]. -CT CAP from 01/22/2020 showed interval enlargement of medial right pulmonary  nodule measuring about 7 mm, previously 5 mm.  Evaluation of the new lesion in the left lobe of the liver is limited by metallic fiducial marking clips although lesion appears to be diminished.  Low attenuation ablation sites in the right lobe of the liver unchanged.  Left lower lobe lung lesion also decreased in size. - CT CAP on 04/01/2020 showed stable 7 mm right upper lobe lung nodule.  Left lower lobe nodule/scarring is stable.  2 ablation defects in the posterior and inferior right hepatic lobe are stable in appearance without evidence of residual contrast-enhancement.  2 cm cystic lesion in the anterior aspect of the spleen is stable.  Stable mild splenomegaly. - Right upper lobe lesion SBRT from 08/06/2020 through 08/13/2020.   2.  Hyperbilirubinemia: -Total bilirubin is 5.1.  She has Gilbert's syndrome.  Baseline is between 2 and 3.   3.  Hypothyroidism: -She is on Armour Thyroid.  TSH is 3.6.   4.  Weight loss: -She had 10 pound weight loss since her last visit 3 months ago.  However she had ileostomy reversal and is adjusting to new food habits.   PLAN:  1.  Stage IV rectal adenocarcinoma: - She denies any major infections in the last 4 weeks.  She had nasal discharge for a few days. - She reported pain in the left  lower ribs on deep inspiration which started about 4 to 5 days ago.  She describes it as a pulling sensation.  This has gotten better in the last 2 days.  There is no tender area in the left posterior chest wall. - Reviewed CT CAP with contrast from 05/13/2021.  New subpleural consolidation in the superior segment of left lower lobe.  Increasing subpleural nodularity in the posteromedial left lower lobe (3/95-1 15).  Subcarinal lymph node previously 4 mm now measures 10 mm.  No new adenopathy.  Spleen is enlarged at 13.8 cm. - Review of labs shows normal LFTs.  CEA was 1.7. - Her CEA was always normal. - She reports that she is exposed to her mother who had pneumonia and is in  the [facility]. - The lung findings at this time are nonspecific. - I have recommended her to call us in 3 to 4 weeks.  If her left posterior chest wall pain/pulling sensation does not get better, will consider PET CT scan.  Otherwise we will rescan her with a CT chest with contrast in 3 months.  We will also repeat labs at that time.   2.  Hyperbilirubinemia: - She has Gilbert's syndrome.  Total bilirubin is 2.7.   3.  Weight loss: - She reports her appetite has decreased since Remeron was discontinued at last visit. - She does not have an appetite at suppertime. - We will start her back on Remeron 15 mg every other day, titrate up to 15 mg daily.   4.  Hypothyroidism: - Continue Armour Thyroid 50 mcg daily and 75 mcg on Monday and Thursday.   5.  Leukopenia and thrombocytopenia: - She has intermittent leukopenia and thrombocytopenia.  Spleen size around 13.8 cm. - F38, MMA, folic acid were normal on 11/09/2020.   Orders placed this encounter:  No orders of the defined types were placed in this encounter.    Derek Jack, MD [place] [phone]   I, Thana Ates, am acting as a scribe for Dr. Derek Jack.  I, Derek Jack MD, have reviewed the above documentation for accuracy and completeness, and I agree with the above.

## 2021-05-18 ENCOUNTER — Other Ambulatory Visit (HOSPITAL_COMMUNITY): Payer: Self-pay

## 2021-05-19 ENCOUNTER — Ambulatory Visit (HOSPITAL_COMMUNITY): Payer: 59 | Admitting: Hematology

## 2021-06-02 ENCOUNTER — Other Ambulatory Visit (HOSPITAL_COMMUNITY): Payer: Self-pay

## 2021-06-10 ENCOUNTER — Encounter (INDEPENDENT_AMBULATORY_CARE_PROVIDER_SITE_OTHER): Payer: Self-pay

## 2021-06-21 ENCOUNTER — Other Ambulatory Visit (HOSPITAL_COMMUNITY): Payer: Self-pay

## 2021-06-21 MED ORDER — LEVOFLOXACIN 500 MG PO TABS
500.0000 mg | ORAL_TABLET | Freq: Every day | ORAL | 0 refills | Status: DC
  Filled 2021-06-21: qty 7, 7d supply, fill #0

## 2021-06-22 ENCOUNTER — Other Ambulatory Visit (HOSPITAL_COMMUNITY): Payer: Self-pay

## 2021-06-22 ENCOUNTER — Other Ambulatory Visit (HOSPITAL_COMMUNITY): Payer: Self-pay | Admitting: *Deleted

## 2021-06-22 MED ORDER — AMOXICILLIN-POT CLAVULANATE 875-125 MG PO TABS
1.0000 | ORAL_TABLET | Freq: Two times a day (BID) | ORAL | 0 refills | Status: AC
  Filled 2021-06-22: qty 14, 7d supply, fill #0

## 2021-06-22 NOTE — Telephone Encounter (Signed)
Patient unable to take Levaquin due to Aortic Aneurysm contraindication.  Augmentin sent per Dr. Tomie China request.  Patient aware. ?

## 2021-07-15 ENCOUNTER — Other Ambulatory Visit (HOSPITAL_COMMUNITY): Payer: Self-pay

## 2021-07-15 ENCOUNTER — Encounter (HOSPITAL_COMMUNITY): Payer: Self-pay

## 2021-07-15 ENCOUNTER — Encounter (HOSPITAL_COMMUNITY)
Admission: RE | Admit: 2021-07-15 | Discharge: 2021-07-15 | Disposition: A | Discharge status: Home / Self Care | Payer: 59 | Source: Ambulatory Visit | Attending: Internal Medicine | Admitting: Internal Medicine

## 2021-07-15 ENCOUNTER — Other Ambulatory Visit: Payer: Self-pay

## 2021-07-15 ENCOUNTER — Other Ambulatory Visit (INDEPENDENT_AMBULATORY_CARE_PROVIDER_SITE_OTHER): Payer: Self-pay | Admitting: Internal Medicine

## 2021-07-15 MED ORDER — AMITRIPTYLINE HCL 10 MG PO TABS
10.0000 mg | ORAL_TABLET | Freq: Two times a day (BID) | ORAL | 3 refills | Status: DC
  Filled 2021-07-15: qty 180, 90d supply, fill #0
  Filled 2021-12-01: qty 180, 90d supply, fill #1
  Filled 2022-05-23: qty 180, 90d supply, fill #2

## 2021-07-15 NOTE — Telephone Encounter (Signed)
Amitriptyline prescription refilled. ?

## 2021-07-20 ENCOUNTER — Encounter: Payer: Self-pay | Admitting: Family Medicine

## 2021-07-20 ENCOUNTER — Other Ambulatory Visit (HOSPITAL_COMMUNITY): Payer: Self-pay

## 2021-07-20 ENCOUNTER — Other Ambulatory Visit: Payer: Self-pay | Admitting: Family Medicine

## 2021-07-20 MED ORDER — ALPRAZOLAM 0.5 MG PO TABS
0.5000 mg | ORAL_TABLET | Freq: Three times a day (TID) | ORAL | 1 refills | Status: DC | PRN
  Filled 2021-07-20: qty 90, 30d supply, fill #0
  Filled 2021-09-02: qty 90, 30d supply, fill #1

## 2021-07-21 ENCOUNTER — Ambulatory Visit (HOSPITAL_BASED_OUTPATIENT_CLINIC_OR_DEPARTMENT_OTHER): Payer: 59 | Admitting: Anesthesiology

## 2021-07-21 ENCOUNTER — Other Ambulatory Visit (INDEPENDENT_AMBULATORY_CARE_PROVIDER_SITE_OTHER): Payer: Self-pay | Admitting: Internal Medicine

## 2021-07-21 ENCOUNTER — Ambulatory Visit (HOSPITAL_COMMUNITY)
Admission: RE | Admit: 2021-07-21 | Discharge: 2021-07-21 | Disposition: A | Discharge status: Home / Self Care | Payer: 59 | Attending: Internal Medicine | Admitting: Internal Medicine

## 2021-07-21 ENCOUNTER — Ambulatory Visit (HOSPITAL_COMMUNITY): Payer: 59 | Admitting: Anesthesiology

## 2021-07-21 ENCOUNTER — Other Ambulatory Visit (HOSPITAL_COMMUNITY): Payer: Self-pay

## 2021-07-21 ENCOUNTER — Encounter (HOSPITAL_COMMUNITY): Payer: Self-pay | Admitting: Internal Medicine

## 2021-07-21 ENCOUNTER — Encounter (HOSPITAL_COMMUNITY)
Admission: RE | Disposition: A | Discharge status: Home / Self Care | Payer: Self-pay | Source: Home / Self Care | Attending: Internal Medicine

## 2021-07-21 DIAGNOSIS — Z85038 Personal history of other malignant neoplasm of large intestine: Secondary | ICD-10-CM | POA: Diagnosis not present

## 2021-07-21 DIAGNOSIS — Z9221 Personal history of antineoplastic chemotherapy: Secondary | ICD-10-CM | POA: Diagnosis not present

## 2021-07-21 DIAGNOSIS — Z1211 Encounter for screening for malignant neoplasm of colon: Secondary | ICD-10-CM

## 2021-07-21 DIAGNOSIS — Z923 Personal history of irradiation: Secondary | ICD-10-CM | POA: Insufficient documentation

## 2021-07-21 DIAGNOSIS — E039 Hypothyroidism, unspecified: Secondary | ICD-10-CM

## 2021-07-21 DIAGNOSIS — E063 Autoimmune thyroiditis: Secondary | ICD-10-CM | POA: Insufficient documentation

## 2021-07-21 DIAGNOSIS — Z08 Encounter for follow-up examination after completed treatment for malignant neoplasm: Secondary | ICD-10-CM | POA: Diagnosis not present

## 2021-07-21 DIAGNOSIS — Z980 Intestinal bypass and anastomosis status: Secondary | ICD-10-CM | POA: Insufficient documentation

## 2021-07-21 DIAGNOSIS — K624 Stenosis of anus and rectum: Secondary | ICD-10-CM | POA: Diagnosis not present

## 2021-07-21 DIAGNOSIS — I700 Atherosclerosis of aorta: Secondary | ICD-10-CM | POA: Diagnosis not present

## 2021-07-21 DIAGNOSIS — D638 Anemia in other chronic diseases classified elsewhere: Secondary | ICD-10-CM

## 2021-07-21 DIAGNOSIS — Z87891 Personal history of nicotine dependence: Secondary | ICD-10-CM | POA: Diagnosis not present

## 2021-07-21 DIAGNOSIS — Z86010 Personal history of colonic polyps: Secondary | ICD-10-CM | POA: Diagnosis not present

## 2021-07-21 DIAGNOSIS — K219 Gastro-esophageal reflux disease without esophagitis: Secondary | ICD-10-CM | POA: Diagnosis not present

## 2021-07-21 HISTORY — PX: COLONOSCOPY WITH PROPOFOL: SHX5780

## 2021-07-21 LAB — COMPREHENSIVE METABOLIC PANEL
ALT: 12 U/L (ref 0–44)
AST: 22 U/L (ref 15–41)
Albumin: 4.2 g/dL (ref 3.5–5.0)
Alkaline Phosphatase: 82 U/L (ref 38–126)
Anion gap: 9 (ref 5–15)
BUN: 7 mg/dL (ref 6–20)
CO2: 29 mmol/L (ref 22–32)
Calcium: 8.8 mg/dL — ABNORMAL LOW (ref 8.9–10.3)
Chloride: 101 mmol/L (ref 98–111)
Creatinine, Ser: 0.9 mg/dL (ref 0.44–1.00)
GFR, Estimated: >60 mL/min (ref >60)
Glucose, Bld: 100 mg/dL — ABNORMAL HIGH (ref 70–99)
Potassium: 3.1 mmol/L — ABNORMAL LOW (ref 3.5–5.1)
Sodium: 139 mmol/L (ref 135–145)
Total Bilirubin: 3.4 mg/dL — ABNORMAL HIGH (ref 0.3–1.2)
Total Protein: 7.3 g/dL (ref 6.5–8.1)

## 2021-07-21 LAB — MAGNESIUM: Magnesium: 2.4 mg/dL (ref 1.7–2.4)

## 2021-07-21 LAB — HM COLONOSCOPY

## 2021-07-21 SURGERY — COLONOSCOPY WITH PROPOFOL
Anesthesia: General

## 2021-07-21 MED ORDER — PROPOFOL 500 MG/50ML IV EMUL
INTRAVENOUS | Status: DC | PRN
  Administered 2021-07-21: 150 ug/kg/min via INTRAVENOUS

## 2021-07-21 MED ORDER — MIDAZOLAM HCL 2 MG/2ML IJ SOLN
2.0000 mg | Freq: Once | INTRAMUSCULAR | Status: AC
Start: 2021-07-21 — End: 2021-07-21
  Administered 2021-07-21: 2 mg via INTRAVENOUS

## 2021-07-21 MED ORDER — POTASSIUM CHLORIDE CRYS ER 20 MEQ PO TBCR
20.0000 meq | EXTENDED_RELEASE_TABLET | Freq: Every day | ORAL | 1 refills | Status: DC
  Filled 2021-07-21: qty 30, 30d supply, fill #0

## 2021-07-21 MED ORDER — PROPOFOL 10 MG/ML IV BOLUS
INTRAVENOUS | Status: DC | PRN
  Administered 2021-07-21: 100 mg via INTRAVENOUS

## 2021-07-21 MED ORDER — LACTATED RINGERS IV SOLN
INTRAVENOUS | Status: DC
  Administered 2021-07-21: 1000 mL via INTRAVENOUS

## 2021-07-21 MED ORDER — MIDAZOLAM HCL 2 MG/2ML IJ SOLN
2.0000 mg | Freq: Once | INTRAMUSCULAR | Status: AC
  Administered 2021-07-21: 2 mg via INTRAVENOUS

## 2021-07-21 MED ORDER — HEPARIN SOD (PORK) LOCK FLUSH 100 UNIT/ML IV SOLN
500.0000 [IU] | INTRAVENOUS | Status: AC | PRN
  Administered 2021-07-21: 500 [IU]
  Filled 2021-07-21: qty 5

## 2021-07-21 MED ORDER — MIDAZOLAM HCL 2 MG/2ML IJ SOLN
INTRAMUSCULAR | Status: AC
  Filled 2021-07-21: qty 2

## 2021-07-21 MED ORDER — HEPARIN SOD (PORK) LOCK FLUSH 100 UNIT/ML IV SOLN
INTRAVENOUS | Status: AC
  Filled 2021-07-21: qty 5

## 2021-07-21 MED ORDER — POTASSIUM CHLORIDE CRYS ER 20 MEQ PO TBCR
20.0000 meq | EXTENDED_RELEASE_TABLET | Freq: Once | ORAL | Status: AC
  Administered 2021-07-21: 20 meq via ORAL
  Filled 2021-07-21: qty 1

## 2021-07-21 MED ORDER — MIDAZOLAM HCL 2 MG/2ML IJ SOLN
INTRAMUSCULAR | Status: AC
  Administered 2021-07-21: 2 mg
  Filled 2021-07-21: qty 2

## 2021-07-21 MED ORDER — SODIUM CHLORIDE 0.9% FLUSH: 10.0000 mL | INTRAVENOUS | Status: DC | PRN

## 2021-07-21 NOTE — Transfer of Care (Signed)
Immediate Anesthesia Transfer of Care Note ? ?Patient: Samantha [facility] ? ?Procedure(s) Performed: COLONOSCOPY WITH PROPOFOL ? ?Patient Location: PACU ? ?Anesthesia Type:General ? ?Level of Consciousness: awake, alert , oriented and patient cooperative ? ?Airway & Oxygen Therapy: Patient Spontanous Breathing ? ?Post-op Assessment: Report given to RN, Post -op Vital signs reviewed and stable and Patient moving all extremities X 4 ? ?Post vital signs: Reviewed and stable ? ?Last Vitals:  ?Vitals Value Taken Time  ?BP    ?Temp    ?Pulse    ?Resp    ?SpO2    ? ? ?Last Pain:  ?Vitals:  ? 07/21/21 1116  ?TempSrc:   ?PainSc: 0-No pain  ?   ? ?Patients Stated Pain Goal: 6 (07/21/21 0846) ? ?Complications: No notable events documented. ?

## 2021-07-21 NOTE — Op Note (Signed)
[facility] ?Patient Name: Samantha Reynolds ?Procedure Date: 07/21/2021 11:15 AM ?MRN: [id number] ?Date of Birth: May 03, 1965 ?Attending MD: Hildred Laser , MD ?CSN: [id number] ?Age: 56 ?Admit Type: Outpatient ?Procedure:                Colonoscopy ?Indications:              High risk colon cancer surveillance: Personal  ?                          history of colon cancer ?Providers:                Hildred Laser, MD, Caprice Kluver, Raphael Gibney,  ?                          Technician ?Referring MD:             Sallee Lange, MD and Derek Jack, MD ?Medicines:                Propofol per Anesthesia ?Complications:            No immediate complications. ?Estimated Blood Loss:     Estimated blood loss: none. ?Procedure:                Pre-Anesthesia Assessment: ?                          - Prior to the procedure, a History and Physical  ?                          was performed, and patient medications and  ?                          allergies were reviewed. The patient's tolerance of  ?                          previous anesthesia was also reviewed. The risks  ?                          and benefits of the procedure and the sedation  ?                          options and risks were discussed with the patient.  ?                          All questions were answered, and informed consent  ?                          was obtained. Prior Anticoagulants: The patient has  ?                          taken no previous anticoagulant or antiplatelet  ?                          agents. ASA Grade Assessment: II - A patient with  ?  mild systemic disease. After reviewing the risks  ?                          and benefits, the patient was deemed in  ?                          satisfactory condition to undergo the procedure. ?                          After obtaining informed consent, the colonoscope  ?                          was passed under direct vision. Throughout the  ?                           procedure, the patient's blood pressure, pulse, and  ?                          oxygen saturations were monitored continuously. The  ?                          [id number]-HQ190L ([id number]) scope was introduced through  ?                          the anus and advanced to the the cecum, identified  ?                          by appendiceal orifice and ileocecal valve. The  ?                          colonoscopy was performed without difficulty. The  ?                          patient tolerated the procedure well. The quality  ?                          of the bowel preparation was good except the cecum  ?                          and ascending colon was fair. ?Scope In: 11:21:09 AM ?Scope Out: 11:35:52 AM ?Scope Withdrawal Time: 0 hours 10 minutes 35 seconds  ?Total Procedure Duration: 0 hours 14 minutes 43 seconds  ?Findings: ?     The digital rectal exam findings include anal stricture. ?     The colon (entire examined portion) appeared normal. ?     There was evidence of a prior end-to-end colo-anal anastomosis from anus  ?     to sigmoid colon. This was noncritical stricture. Anastomosis marked by  ?     suture material. and was characterized by healthy appearing mucosa. ?Impression:               - Anal stricture found on digital rectal exam. ?                          - The entire examined  colon is normal. ?                          - Noncritical Coloanal anastomotic stricture.  ?                          Anastomosis marked by suture material. end-to-end  ?                          colo-anal anastomosis, characterized by healthy  ?                          appearing mucosa. ?                          - No specimens collected. ?Moderate Sedation: ?     Per Anesthesia Care ?Recommendation:           - Patient has a contact number available for  ?                          emergencies. The signs and symptoms of potential  ?                          delayed complications were discussed with the  ?                           patient. Return to normal activities tomorrow.  ?                          Written discharge instructions were provided to the  ?                          patient. ?                          - Resume previous diet today. ?                          - Continue present medications. ?                          - Repeat colonoscopy in 3 years for surveillance. ?Procedure Code(s):        --- Professional --- ?                          [phone], Colonoscopy, flexible; diagnostic, including  ?                          collection of specimen(s) by brushing or washing,  ?                          when performed (separate procedure) ?Diagnosis Code(s):        --- Professional --- ?                          W97.989, Personal history of other malignant  ?  neoplasm of large intestine ?                          K62.4, Stenosis of anus and rectum ?                          Z98.0, Intestinal bypass and anastomosis status ?CPT copyright 2019 [employer]. All rights reserved. ?The codes documented in this report are preliminary and upon coder review may  ?be revised to meet current compliance requirements. ?Hildred Laser, MD ?Hildred Laser, MD ?07/21/2021 11:51:39 AM ?This report has been signed electronically. ?Number of Addenda: 0 ?

## 2021-07-21 NOTE — H&P (Signed)
Samantha Reynolds is an 56 y.o. female.   ?Chief Complaint: Patient is here for colonoscopy. ?HPI: Patient is 56 year old Caucasian female who was diagnosed with rectal cancer in November 2019.  She underwent preop chemoradiation followed by robotic assisted low anterior resection with ileostomy in September 2020.  Ileostomy was reversed in January 2021.  She required ablation for liver lesions and radiation therapy for lung lesions.  She is under care of Dr. Delton Coombes.  Her CEA has remained normal.  Chest and abdominal pelvic CT in February 2023 did not reveal any concerning findings.  She is finally been able to gradually gain weight.  She states she can go 1 week and have 1 formed stool daily and if she skips for 2 to 3 days then she has diarrhea and only then she notices scant amount of blood.  She has pain in left upper quadrant which is always relieved with defecation.  ?Family history is negative for colon cancer. ? ?When patient reported to preop.  She was complaining of paresthesias in both hands.  She did not have chest pain or shortness of breath.  Now she has paresthesia involving her fingertips.  Electrolytes are pertinent for serum potassium of 3.1.  Serum magnesium is 2.4.  She has received midazolam which has helped. ? ?Past Medical History:  ?Diagnosis Date  ? Anxiety   ? Cancer [facility]) 2013  ? thyroid  ? Endometrial polyp   ? Endometrial thickening on ultra sound   ? GERD (gastroesophageal reflux disease)   ? 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 Worse in her 71s when she was ill  ? H/O Hashimoto thyroiditis   ? History of chemotherapy   ? History of radiation therapy   ? History of thyroid cancer no recurrence  ? 2013--  s/p  left lobe thyroidectomy--  follicular varient papillary / lymphocyctic thyroiditis  ? Hyperlipidemia 11/09/2015  ? Hypothyroidism   ? 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[place]) 02/26/2018  ? ? ?Past Surgical History:  ?Procedure Laterality Date  ? BIOPSY  02/19/2018  ? Procedure: BIOPSY;   Surgeon: Rogene Houston, MD;  [facility];  Service: Endoscopy;;  rectum  ? COLONOSCOPY N/A 08/20/2015  ? Procedure: COLONOSCOPY;  Surgeon: Rogene Houston, MD;  [facility];  Service: Endoscopy;  Laterality: N/A;  730  ? D & C HYSTEROOSCOPY W/ THERMACHOICE ENDOMETRIAL ABLATION  08-13-2004  ? DIVERTING ILEOSTOMY N/A 12/07/2018  ? Procedure: DIVERTING LOOP ILEOSTOMY;  Surgeon: Leighton Ruff, MD;  [facility];  Service: General;  Laterality: N/A;  ? FLEXIBLE SIGMOIDOSCOPY N/A 02/19/2018  ? Procedure: FLEXIBLE SIGMOIDOSCOPY;  Surgeon: Rogene Houston, MD;  [facility];  Service: Endoscopy;  Laterality: N/A;  ? HYSTEROSCOPY WITH D & C N/A 04/30/2015  ? Procedure: DILATATION AND CURETTAGE / INTENDED HYSTEROSCOPY;  Surgeon: Dian Queen, MD;  Location: [place];  Service: Gynecology;  Laterality: N/A;  ? ILEOSTOMY CLOSURE N/A 04/17/2019  ? Procedure: LOOP ILEOSTOMY REVERSAL;  Surgeon: Leighton Ruff, MD;  [facility];  Service: General;  Laterality: N/A;  ? IR US GUIDE BX ASP/DRAIN  03/09/2018  ? LAPAROSCOPIC CHOLECYSTECTOMY  04/1996  ? LAPAROSCOPY N/A 12/11/2018  ? Procedure: LAPAROSCOPY  ILEOSTOMY REVISION AND ABDOMINAL WASHOUT;  Surgeon: Leighton Ruff, MD;  [facility];  Service: General;  Laterality: N/A;  ? Liver Microwave   10/17/2018  ? POLYPECTOMY  08/20/2015  ? Procedure: POLYPECTOMY;  Surgeon: Rogene Houston, MD;  [facility];  Service: Endoscopy;;  Splenic flexure polypectomy  ? POLYPECTOMY  02/19/2018  ? Procedure: POLYPECTOMY;  Surgeon: Rogene Houston, MD;  [facility];  Service: Endoscopy;;  rectum  ? PORTACATH PLACEMENT N/A 03/14/2018  ? Procedure: INSERTION PORT-A-CATH;  Surgeon: Aviva Signs, MD;  [facility];  Service: General;  Laterality: N/A;  ? REDUCTION INCARCERATED UTERUS  06-20-2000  ? intrauterine preg. 13 wks /  urinary retention  ? THYROID LOBECTOMY  11/24/2011  ? Procedure: THYROID LOBECTOMY;   Surgeon: Earnstine Regal, MD;  [facility];  Service: General;  Laterality: Left;  Left Thyroid Lobectomy  ? TUBAL LIGATION  2002  ? XI ROBOTIC ASSISTED LOWER ANTERIOR RESECTION N/A 12/07/2018  ? Procedure: XI ROBOTIC ASSISTED LOWER ANTERIOR RESECTION, SPENIC FLEXURE IMMOBILIZATION, COLOANAL ANASTOMOSIS, RIGID PROCTOSCOPY;  Surgeon: Leighton Ruff, MD;  [facility];  Service: General;  Laterality: N/A;  ? ? ?Family History  ?Problem Relation Age of Onset  ? Hypertension Mother   ? Hypertension Father   ? Prostate cancer Father   ? Cancer Maternal Aunt   ?     spine/back  ? Cancer Paternal Grandfather   ?     lung  ? Healthy Son   ? Healthy Son   ? Healthy Son   ? Healthy Son   ? Colon cancer Neg Hx   ? ?Social History:  reports that she quit smoking about 29 years ago. Her smoking use included cigarettes. She has a 2.50 pack-year smoking history. She has never used smokeless tobacco. She reports that she does not drink alcohol and does not use drugs. ? ?Allergies:  ?Allergies  ?Allergen Reactions  ? Demerol [Meperidine] Itching  ?  All over the body.  ? Penicillins Hives  ?   childhood, does not remember if it spread all over the body or not. ?Did it involve swelling of the face/tongue/throat, SOB, or low BP? No ?Did it involve sudden or severe rash/hives, skin peeling, or any reaction on the inside of your mouth or nose? Yes ?Did you need to seek medical attention at a hospital or doctor's office? Yes ?When did it last happen?      childhood allergy ?If all above answers are ?NO?, may proceed with cephalosporin use. ?  ? Levaquin [Levofloxacin] Other (See Comments)  ?  Patient has Aortic Aneurysm and is not indicated with this diagnosis  ? Other Hives and Other (See Comments)  ?  Fresh coconut   ? Vancomycin Rash  ?  Will need Benadryl prior to administration per IV. "Red man syndrome"  ? ? ?Medications Prior to Admission  ?Medication Sig Dispense Refill  ? ALPRAZolam (XANAX) 0.5 MG tablet Take 1 tablet (0.5  mg total) by mouth 3 (three) times daily as needed for anxiety. 90 tablet 1  ? amitriptyline (ELAVIL) 10 MG tablet Take 1 tablet (10 mg total) by mouth 2 (two) times daily. 180 tablet 3  ? loperamide (IMODIUM) 2 MG capsule Take 1 capsule (2 mg total) by mouth 3 (three) times daily. (Patient taking differently: Take 2 mg by mouth daily.) 30 capsule 0  ? mirtazapine (REMERON) 15 MG tablet Take 1 tablet (15 mg total) by mouth at bedtime. 30 tablet 3  ? thyroid (ARMOUR) 60 MG tablet Take 60 mg by mouth daily.    ? COVID-19 At Home Antigen Test [facility] COVID-19 HOME TEST) KIT Use as directed 4 each 0  ? magic mouthwash w/lidocaine SOLN 1 Part viscous lidocaine 2% ?1 Part  Maalox ?1 Part diphenhydramine 12.5 mg per 5 ml elixir ?Quantity: 120 ml ? ?Sig: Swish, gargle, and spit one to two teaspoonfuls every six hours as needed. Shake well before using. (Patient not taking: Reported on 05/17/2021) 120 mL 0  ? prochlorperazine (COMPAZINE) 10 MG tablet Take 1 tablet (10 mg total) by mouth every 6 (six) hours as needed for nausea or vomiting. 60 tablet 3  ? ? ?Results for orders placed or performed during the hospital encounter of 07/21/21 (from the past 48 hour(s))  ?Comprehensive metabolic panel     Status: Abnormal  ? Collection Time: 07/21/21 10:28 AM  ?Result Value Ref Range  ? Sodium 139 135 - 145 mmol/L  ? Potassium 3.1 (L) 3.5 - 5.1 mmol/L  ? Chloride 101 98 - 111 mmol/L  ? CO2 29 22 - 32 mmol/L  ? Glucose, Bld 100 (H) 70 - 99 mg/dL  ?  Comment: Glucose reference range applies only to samples taken after fasting for at least 8 hours.  ? BUN 7 6 - 20 mg/dL  ? Creatinine, Ser 0.90 0.44 - 1.00 mg/dL  ? Calcium 8.8 (L) 8.9 - 10.3 mg/dL  ? Total Protein 7.3 6.5 - 8.1 g/dL  ? Albumin 4.2 3.5 - 5.0 g/dL  ? AST 22 15 - 41 U/L  ? ALT 12 0 - 44 U/L  ? Alkaline Phosphatase 82 38 - 126 U/L  ? Total Bilirubin 3.4 (H) 0.3 - 1.2 mg/dL  ? GFR, Estimated >60 >60 mL/min  ?  Comment: (NOTE) ?Calculated using the CKD-EPI Creatinine  Equation (2021) ?  ? Anion gap 9 5 - 15  ?  Comment: Performed at [facility], [address], [place], [place] [address]  ?Magnesium     Status: None  ? Collection Time: 07/21/21 10:28 AM  ?Result Value Ref Loyal Buba

## 2021-07-21 NOTE — Anesthesia Postprocedure Evaluation (Signed)
Anesthesia Post Note ? ?Patient: Samantha Reynolds ? ?Procedure(s) Performed: COLONOSCOPY WITH PROPOFOL ? ?Patient location during evaluation: Phase II ?Anesthesia Type: General ?Level of consciousness: awake and alert and oriented ?Pain management: pain level controlled ?Vital Signs Assessment: post-procedure vital signs reviewed and stable ?Respiratory status: spontaneous breathing, nonlabored ventilation and respiratory function stable ?Cardiovascular status: blood pressure returned to baseline and stable ?Postop Assessment: no apparent nausea or vomiting ?Anesthetic complications: no ? ? ?No notable events documented. ? ? ?Last Vitals:  ?Vitals:  ? 07/21/21 0846 07/21/21 1143  ?BP: 137/88 104/77  ?Pulse: (!) 109 98  ?Resp: 16 16  ?Temp: 37.2 ?C 36.7 ?C  ?SpO2: 100% 100%  ?  ?Last Pain:  ?Vitals:  ? 07/21/21 1143  ?TempSrc: Oral  ?PainSc: 0-No pain  ? ? ?  ?  ?  ?  ?  ?  ? ?Taren Toops C Demiah Gullickson ? ? ? ? ?

## 2021-07-21 NOTE — Discharge Instructions (Signed)
Resume usual medications and diet as before. ?KCl 20 mEq by mouth daily. ?No driving for 24 hours. ?Next exam in 3 years. ?

## 2021-07-21 NOTE — Anesthesia Preprocedure Evaluation (Addendum)
Anesthesia Evaluation  ?Patient identified by MRN, date of birth, ID band ?Patient awake ? ? ? ?Reviewed: ?Allergy & Precautions, NPO status , Patient's Chart, lab work & pertinent test results ? ?Airway ?Mallampati: II ? ?TM Distance: >3 FB ?Neck ROM: Full ? ? ? Dental ? ?(+) Dental Advisory Given, Teeth Intact ?  ?Pulmonary ?former smoker,  ?Mets to lung, radiation treatment ?  ?Pulmonary exam normal ?breath sounds clear to auscultation ? ? ? ? ? ? Cardiovascular ?Exercise Tolerance: Good ?Normal cardiovascular exam ?Rhythm:Regular Rate:Normal ? ? ?  ?Neuro/Psych ?PSYCHIATRIC DISORDERS Anxiety  Neuromuscular disease   ? GI/Hepatic ?GERD  Medicated and Controlled,Gilbert syndrome, Mets to liver ?Malignant neoplasm of rectum ?  ?Endo/Other  ?Hypothyroidism Thyroid cancer ? Renal/GU ?negative Renal ROS  ?negative genitourinary ?  ?Musculoskeletal ?negative musculoskeletal ROS ?(+)  ? Abdominal ?  ?Peds ?negative pediatric ROS ?(+)  Hematology ? ?(+) Blood dyscrasia, anemia ,   ?Anesthesia Other Findings ?IMPRESSION: ?1. Progression in pulmonary metastatic disease, namely in the left lower lobe. Likely associated increase in size of a subcarinal lymph node. Narrowing/occlusion of the left lower lobe superior segmental bronchus. ?2. Presumed interval treatment of an apical segment right upper lobe ?metastasis. Stable treated liver metastases. ?3. Splenomegaly. ?4. Ascending aortic aneurysm, stable Recommend annual imaging ?followup by CTA or MRA. This recommendation follows 2010 ?ACCF/AHA/AATS/ACR/ASA/SCA/SCAI/SIR/STS/SVM Guidelines for the ?Diagnosis and Management of Patients with Thoracic Aortic Disease. ?Circulation. 2010; 121: O709-G283. Aortic aneurysm NOS ?(ICD10-I71.9). ?5. Aortic atherosclerosis (ICD10-I70.0). Coronary artery ?calcification. ?? ?? ?Electronically Signed ?  By: Lorin Picket M.D. ?  On: 05/13/2021 10:56 ? Reproductive/Obstetrics ?negative OB ROS ? ?   ? ? ? ? ? ? ? ? ? ? ? ? ? ?  ?  ? ? ? ? ? ? ?Anesthesia Physical ?Anesthesia Plan ? ?ASA: 3 ? ?Anesthesia Plan: General  ? ?Post-op Pain Management: Minimal or no pain anticipated  ? ?Induction: Intravenous ? ?PONV Risk Score and Plan: Propofol infusion ? ?Airway Management Planned: Nasal Cannula and Natural Airway ? ?Additional Equipment:  ? ?Intra-op Plan:  ? ?Post-operative Plan:  ? ?Informed Consent: I have reviewed the patients History and Physical, chart, labs and discussed the procedure including the risks, benefits and alternatives for the proposed anesthesia with the patient or authorized representative who has indicated his/her understanding and acceptance.  ? ? ? ?Dental advisory given ? ?Plan Discussed with: CRNA and Surgeon ? ?Anesthesia Plan Comments:   ? ? ? ? ? ? ?Anesthesia Quick Evaluation ? ?

## 2021-07-22 ENCOUNTER — Encounter (INDEPENDENT_AMBULATORY_CARE_PROVIDER_SITE_OTHER): Payer: Self-pay | Admitting: *Deleted

## 2021-07-26 ENCOUNTER — Encounter (HOSPITAL_COMMUNITY): Payer: Self-pay | Admitting: Internal Medicine

## 2021-07-30 ENCOUNTER — Other Ambulatory Visit (HOSPITAL_COMMUNITY): Payer: Self-pay

## 2021-08-02 ENCOUNTER — Other Ambulatory Visit (HOSPITAL_COMMUNITY)
Admission: RE | Admit: 2021-08-02 | Discharge: 2021-08-02 | Disposition: A | Discharge status: Home / Self Care | Payer: 59 | Source: Ambulatory Visit | Attending: Hematology | Admitting: Hematology

## 2021-08-02 ENCOUNTER — Other Ambulatory Visit (HOSPITAL_COMMUNITY): Payer: Self-pay

## 2021-08-02 DIAGNOSIS — C189 Malignant neoplasm of colon, unspecified: Secondary | ICD-10-CM | POA: Diagnosis not present

## 2021-08-02 LAB — COMPREHENSIVE METABOLIC PANEL
ALT: 14 U/L (ref 0–44)
AST: 21 U/L (ref 15–41)
Albumin: 4.2 g/dL (ref 3.5–5.0)
Alkaline Phosphatase: 78 U/L (ref 38–126)
Anion gap: 7 (ref 5–15)
BUN: 14 mg/dL (ref 6–20)
CO2: 26 mmol/L (ref 22–32)
Calcium: 8.9 mg/dL (ref 8.9–10.3)
Chloride: 103 mmol/L (ref 98–111)
Creatinine, Ser: 0.93 mg/dL (ref 0.44–1.00)
GFR, Estimated: >60 mL/min (ref >60)
Glucose, Bld: 97 mg/dL (ref 70–99)
Potassium: 3.5 mmol/L (ref 3.5–5.1)
Sodium: 136 mmol/L (ref 135–145)
Total Bilirubin: 2.1 mg/dL — ABNORMAL HIGH (ref 0.3–1.2)
Total Protein: 7.7 g/dL (ref 6.5–8.1)

## 2021-08-02 LAB — CBC WITH DIFFERENTIAL/PLATELET
Abs Immature Granulocytes: 0.01 10*3/uL (ref 0.00–0.07)
Basophils Absolute: 0 10*3/uL (ref 0.0–0.1)
Basophils Relative: 0 %
Eosinophils Absolute: 0 10*3/uL (ref 0.0–0.5)
Eosinophils Relative: 1 %
HCT: 34.9 % — ABNORMAL LOW (ref 36.0–46.0)
Hemoglobin: 12.2 g/dL (ref 12.0–15.0)
Immature Granulocytes: 0 %
Lymphocytes Relative: 20 %
Lymphs Abs: 0.7 10*3/uL (ref 0.7–4.0)
MCH: 32.7 pg (ref 26.0–34.0)
MCHC: 35 g/dL (ref 30.0–36.0)
MCV: 93.6 fL (ref 80.0–100.0)
Monocytes Absolute: 0.3 10*3/uL (ref 0.1–1.0)
Monocytes Relative: 8 %
Neutro Abs: 2.3 10*3/uL (ref 1.7–7.7)
Neutrophils Relative %: 71 %
Platelets: 109 10*3/uL — ABNORMAL LOW (ref 150–400)
RBC: 3.73 MIL/uL — ABNORMAL LOW (ref 3.87–5.11)
RDW: 13.2 % (ref 11.5–15.5)
WBC: 3.2 10*3/uL — ABNORMAL LOW (ref 4.0–10.5)
nRBC: 0 % (ref 0.0–0.2)

## 2021-08-03 ENCOUNTER — Other Ambulatory Visit (HOSPITAL_COMMUNITY): Payer: Self-pay

## 2021-08-03 ENCOUNTER — Encounter (HOSPITAL_COMMUNITY): Payer: Self-pay | Admitting: Hematology

## 2021-08-03 LAB — CEA: CEA: 2.4 ng/mL (ref 0.0–4.7)

## 2021-08-09 ENCOUNTER — Ambulatory Visit (HOSPITAL_COMMUNITY)
Admission: RE | Admit: 2021-08-09 | Discharge: 2021-08-09 | Disposition: A | Discharge status: Home / Self Care | Payer: 59 | Source: Ambulatory Visit | Attending: Hematology | Admitting: Hematology

## 2021-08-09 ENCOUNTER — Other Ambulatory Visit (HOSPITAL_COMMUNITY): Payer: 59

## 2021-08-09 DIAGNOSIS — C19 Malignant neoplasm of rectosigmoid junction: Secondary | ICD-10-CM | POA: Diagnosis not present

## 2021-08-09 DIAGNOSIS — C189 Malignant neoplasm of colon, unspecified: Secondary | ICD-10-CM | POA: Insufficient documentation

## 2021-08-09 DIAGNOSIS — C20 Malignant neoplasm of rectum: Secondary | ICD-10-CM | POA: Diagnosis not present

## 2021-08-09 DIAGNOSIS — C787 Secondary malignant neoplasm of liver and intrahepatic bile duct: Secondary | ICD-10-CM | POA: Diagnosis not present

## 2021-08-09 DIAGNOSIS — R911 Solitary pulmonary nodule: Secondary | ICD-10-CM | POA: Diagnosis not present

## 2021-08-09 MED ORDER — IOHEXOL 300 MG/ML  SOLN
75.0000 mL | Freq: Once | INTRAMUSCULAR | Status: AC | PRN
  Administered 2021-08-09: 75 mL via INTRAVENOUS

## 2021-08-12 ENCOUNTER — Inpatient Hospital Stay (HOSPITAL_COMMUNITY): Payer: 59 | Attending: Hematology | Admitting: Hematology

## 2021-08-12 DIAGNOSIS — Z87891 Personal history of nicotine dependence: Secondary | ICD-10-CM | POA: Diagnosis not present

## 2021-08-12 DIAGNOSIS — Z8042 Family history of malignant neoplasm of prostate: Secondary | ICD-10-CM | POA: Diagnosis not present

## 2021-08-12 DIAGNOSIS — R634 Abnormal weight loss: Secondary | ICD-10-CM | POA: Insufficient documentation

## 2021-08-12 DIAGNOSIS — Z801 Family history of malignant neoplasm of trachea, bronchus and lung: Secondary | ICD-10-CM | POA: Insufficient documentation

## 2021-08-12 DIAGNOSIS — Z79899 Other long term (current) drug therapy: Secondary | ICD-10-CM | POA: Insufficient documentation

## 2021-08-12 DIAGNOSIS — Z808 Family history of malignant neoplasm of other organs or systems: Secondary | ICD-10-CM | POA: Insufficient documentation

## 2021-08-12 DIAGNOSIS — C189 Malignant neoplasm of colon, unspecified: Secondary | ICD-10-CM | POA: Diagnosis not present

## 2021-08-12 DIAGNOSIS — C20 Malignant neoplasm of rectum: Secondary | ICD-10-CM

## 2021-08-12 DIAGNOSIS — E039 Hypothyroidism, unspecified: Secondary | ICD-10-CM | POA: Diagnosis not present

## 2021-08-12 DIAGNOSIS — I700 Atherosclerosis of aorta: Secondary | ICD-10-CM | POA: Insufficient documentation

## 2021-08-12 DIAGNOSIS — C787 Secondary malignant neoplasm of liver and intrahepatic bile duct: Secondary | ICD-10-CM | POA: Diagnosis not present

## 2021-08-12 DIAGNOSIS — C19 Malignant neoplasm of rectosigmoid junction: Secondary | ICD-10-CM | POA: Diagnosis not present

## 2021-08-12 DIAGNOSIS — Z9221 Personal history of antineoplastic chemotherapy: Secondary | ICD-10-CM | POA: Insufficient documentation

## 2021-08-12 DIAGNOSIS — Z8249 Family history of ischemic heart disease and other diseases of the circulatory system: Secondary | ICD-10-CM | POA: Diagnosis not present

## 2021-08-12 DIAGNOSIS — Z923 Personal history of irradiation: Secondary | ICD-10-CM | POA: Insufficient documentation

## 2021-08-12 DIAGNOSIS — R200 Anesthesia of skin: Secondary | ICD-10-CM | POA: Diagnosis not present

## 2021-08-12 NOTE — Progress Notes (Signed)
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 FOLFOX & vectibix x 7 cycles from 03/14/2018 to 06/27/2018. 2. XRT with Xeloda from 07/30/2018 to 09/06/2018. 3. Right liver lesion microwave ablation on 10/15/2018. 4. Low anterior resection and diverting ileostomy on 12/07/2018. 5. SBRT to liver 60 Gy in 5 fractions from 11/15/2019 to 12/13/2019.  NGS Results: Foundation 1 K-RAS/NRAS wild-type, MS--stable, TMB--low  CURRENT THERAPY: surveillance  BRIEF ONCOLOGIC HISTORY:  Oncology History  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[place])  02/26/2018 Initial Diagnosis   Rectal cancer ([place])    03/13/2018 Cancer Staging   Staging form: Colon and Rectum, AJCC 8th Edition - Clinical stage from 03/13/2018: Stage IVA (cT3, cN1b, cM1a) - Signed by Samantha Jack, MD on 03/13/2018    03/14/2018 - 06/29/2018 Chemotherapy   The patient had palonosetron (ALOXI) injection 0.25 mg, 0.25 mg, Intravenous,  Once, 7 of 8 cycles Administration: 0.25 mg (03/14/2018), 0.25 mg (03/27/2018), 0.25 mg (04/18/2018), 0.25 mg (05/02/2018), 0.25 mg (05/16/2018), 0.25 mg (06/05/2018), 0.25 mg (06/27/2018) leucovorin 800 mg in dextrose 5 % 250 mL infusion, 772 mg, Intravenous,  Once, 7 of 8 cycles Administration: 800 mg (03/14/2018), 800 mg (03/27/2018), 800 mg (05/02/2018), 800 mg (05/16/2018), 700 mg (04/18/2018), 800 mg (06/05/2018), 800 mg (06/27/2018) oxaliplatin (ELOXATIN) 165 mg in dextrose 5 % 500 mL chemo infusion, 85 mg/m2 = 165 mg, Intravenous,  Once, 6 of 7 cycles Dose modification: 68 mg/m2 (80 % of original dose 85 mg/m2, Cycle 4, Reason: Provider Judgment) Administration: 165 mg (03/14/2018), 165 mg (03/27/2018), 130 mg (05/02/2018), 130 mg (05/16/2018), 130 mg (06/05/2018), 130 mg  (06/27/2018) panitumumab (VECTIBIX) 500 mg in sodium chloride 0.9 % 100 mL chemo infusion, 480 mg, Intravenous,  Once, 4 of 5 cycles Administration: 500 mg (04/18/2018), 480 mg (05/02/2018), 480 mg (05/16/2018), 480 mg (06/05/2018) fluorouracil (ADRUCIL) chemo injection 750 mg, 400 mg/m2 = 750 mg (100 % of original dose 400 mg/m2), Intravenous,  Once, 6 of 7 cycles Dose modification: 400 mg/m2 (original dose 400 mg/m2, Cycle 1), 400 mg/m2 (original dose 400 mg/m2, Cycle 3) Administration: 750 mg (03/14/2018), 750 mg (04/18/2018), 750 mg (05/02/2018), 750 mg (05/16/2018), 750 mg (06/05/2018), 750 mg (06/27/2018) fosaprepitant (EMEND) 150 mg, dexamethasone (DECADRON) 12 mg in sodium chloride 0.9 % 145 mL IVPB, , Intravenous,  Once, 4 of 5 cycles Administration:  (05/02/2018),  (05/16/2018),  (06/05/2018),  (06/27/2018) fluorouracil (ADRUCIL) 4,650 mg in sodium chloride 0.9 % 57 mL chemo infusion, 2,400 mg/m2 = 4,650 mg, Intravenous, 1 Day/Dose, 7 of 8 cycles Administration: 4,650 mg (03/14/2018), 4,650 mg (03/27/2018), 4,650 mg (04/18/2018), 4,650 mg (05/02/2018), 4,650 mg (05/16/2018), 4,650 mg (06/05/2018), 4,650 mg (06/27/2018)   for chemotherapy treatment.       CANCER STAGING:  Cancer Staging  Malignant neoplasm of rectum [facility]) Staging form: Colon and Rectum, AJCC 8th Edition - Clinical stage from 03/13/2018: Stage IVA (cT3, cN1b, cM1a) - Signed by Samantha Jack, MD on 03/13/2018   INTERVAL HISTORY:  [name], a 56 y.o. 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 Samantha Reynolds was last seen on 05/17/2021.   Today she reports feeling good. She has gained 10 lbs since her last visit, and her appetite has improved and returned to her baseline.  The pain in her left lower ribs has resolved. She had 1 episode of hemoptysis which resolved after taking Augmentin.   REVIEW OF SYSTEMS:  Review of Systems  Constitutional:  Negative for appetite change, fatigue and  unexpected weight change (+10 lbs).  Respiratory:  Negative for hemoptysis.   Neurological:  Positive for numbness (L big toe).  All other systems reviewed and are negative.  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   Worse in her 90s when she was ill   H/O Hashimoto thyroiditis    History of chemotherapy    History of radiation therapy    History of thyroid cancer no recurrence   2013--  s/p  left lobe thyroidectomy--  follicular varient papillary / lymphocyctic thyroiditis   Hyperlipidemia 11/09/2015   Hypothyroidism    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[place]) 02/26/2018   Past Surgical History:  Procedure Laterality Date   BIOPSY  02/19/2018   Procedure: BIOPSY;  Surgeon: Samantha Houston, MD;  [facility];  Service: Endoscopy;;  rectum   COLONOSCOPY N/A 08/20/2015   Procedure: COLONOSCOPY;  Surgeon: Samantha Houston, MD;  [facility];  Service: Endoscopy;  Laterality: N/A;  730   COLONOSCOPY WITH PROPOFOL N/A 07/21/2021   Procedure: COLONOSCOPY WITH PROPOFOL;  Surgeon: Samantha Houston, MD;  [facility];  Service: Endoscopy;  Laterality: N/A;  10:10   D & C HYSTEROOSCOPY W/ THERMACHOICE ENDOMETRIAL ABLATION  08-13-2004   DIVERTING ILEOSTOMY N/A 12/07/2018   Procedure: DIVERTING LOOP ILEOSTOMY;  Surgeon: Samantha Ruff, MD;  [facility];  Service: General;  Laterality: N/A;   FLEXIBLE SIGMOIDOSCOPY N/A 02/19/2018   Procedure: FLEXIBLE SIGMOIDOSCOPY;  Surgeon: Samantha Houston, MD;  [facility];  Service: Endoscopy;  Laterality: N/A;   HYSTEROSCOPY WITH D & C N/A 04/30/2015   Procedure: DILATATION AND CURETTAGE / INTENDED HYSTEROSCOPY;  Surgeon: Samantha Queen, MD;  Location: [facility];  Service: Gynecology;  Laterality:  N/A;   ILEOSTOMY CLOSURE N/A 04/17/2019   Procedure: LOOP ILEOSTOMY REVERSAL;  Surgeon: Samantha Ruff, MD;  [facility];  Service: General;  Laterality: N/A;   IR US GUIDE BX ASP/DRAIN  03/09/2018   LAPAROSCOPIC CHOLECYSTECTOMY  04/1996   LAPAROSCOPY N/A 12/11/2018   Procedure: LAPAROSCOPY  ILEOSTOMY REVISION AND ABDOMINAL WASHOUT;  Surgeon: Samantha Ruff, MD;  [facility];  Service: General;  Laterality: N/A;   Liver Microwave   10/17/2018   POLYPECTOMY  08/20/2015   Procedure: POLYPECTOMY;  Surgeon: Samantha Houston, MD;  [facility];  Service: Endoscopy;;  Splenic flexure polypectomy   POLYPECTOMY  02/19/2018   Procedure: POLYPECTOMY;  Surgeon: Samantha Houston, MD;  [facility];  Service: Endoscopy;;  rectum   PORTACATH PLACEMENT N/A 03/14/2018   Procedure: INSERTION PORT-A-CATH;  Surgeon: Aviva Signs, MD;  Location[facility];  Service: General;  Laterality: N/A;   REDUCTION INCARCERATED UTERUS  06-20-2000   intrauterine preg. 13 wks /  urinary retention   THYROID LOBECTOMY  11/24/2011   Procedure: THYROID LOBECTOMY;  Surgeon: Earnstine Regal, MD;  [facility];  Service: General;  Laterality: Left;  Left Thyroid Lobectomy   TUBAL LIGATION  2002   XI ROBOTIC ASSISTED LOWER ANTERIOR  RESECTION N/A 12/07/2018   Procedure: XI ROBOTIC ASSISTED LOWER ANTERIOR RESECTION, SPENIC FLEXURE IMMOBILIZATION, COLOANAL ANASTOMOSIS, RIGID PROCTOSCOPY;  Surgeon: Samantha Ruff, MD;  [facility];  Service: General;  Laterality: N/A;    SOCIAL HISTORY:  Social History   Socioeconomic History   Marital status: Married    Spouse name: Not on file   Number of children: 4   Years of education: Not on file   Highest education level: Not on file  Occupational History    Comment: Marketing executive at [employer]   Smoking status: Former    Packs/day: 0.25    Years: 10.00    Pack years: 2.50    Types: Cigarettes    Quit date: 10/31/1991    Years since quitting:  29.8   Smokeless tobacco: Never  Vaping Use   Vaping Use: Never used  Substance and Sexual Activity   Alcohol use: No   Drug use: No   Sexual activity: Not Currently  Other Topics Concern   Not on file  Social History Narrative   Lives with husband [name] and 35 year old son [name]   Husband is Interior and spatial designer of [employer]   Social Determinants of Radio broadcast assistant Strain: Not on file  Food Insecurity: Not on file  Transportation Needs: Not on file  Physical Activity: Not on file  Stress: Not on file  Social Connections: Not on file  Intimate Partner Violence: Not on file    FAMILY HISTORY:  Family History  Problem Relation Age of Onset   Hypertension Mother    Hypertension Father    Prostate cancer Father    Cancer Maternal Aunt        spine/back   Cancer Paternal Grandfather        lung   Healthy Son    Healthy Son    Healthy Son    Healthy Son    Colon cancer Neg Hx     CURRENT MEDICATIONS:  Current Outpatient Medications  Medication Sig Dispense Refill   ALPRAZolam (XANAX) 0.5 MG tablet Take 1 tablet (0.5 mg total) by mouth 3 (three) times daily as needed for anxiety. 90 tablet 1   amitriptyline (ELAVIL) 10 MG tablet Take 1 tablet (10 mg total) by mouth 2 (two) times daily. 180 tablet 3   ARMOUR THYROID 60 MG tablet Take 1 tablet by mouth  daily except on Monday & Thursday take 1 & 1/2 tablets 120 tablet 1   COVID-19 At Home Antigen Test (CARESTART COVID-19 HOME TEST) KIT Use as directed 4 each 0   loperamide (IMODIUM) 2 MG capsule Take 1 capsule (2 mg total) by mouth 3 (three) times daily. (Patient taking differently: Take 2 mg by mouth daily.) 30 capsule 0   mirtazapine (REMERON) 15 MG tablet Take 1 tablet (15 mg total) by mouth at bedtime. 30 tablet 3   potassium chloride SA (KLOR-CON M) 20 MEQ tablet Take 1 tablet (20 mEq total) by mouth daily. 30 tablet 1   prochlorperazine (COMPAZINE) 10 MG tablet Take 1 tablet (10 mg total) by mouth every 6 (six)  hours as needed for nausea or vomiting. 60 tablet 3   thyroid (ARMOUR) 60 MG tablet Take 60 mg by mouth daily.     No current facility-administered medications for this visit.   Facility-Administered Medications Ordered in Other Visits  Medication Dose Route Frequency Provider Last Rate Last Admin   clindamycin (CLEOCIN) 900 mg in dextrose 5 % 50  mL IVPB  900 mg Intravenous 60 min Pre-Op Samantha Ruff, MD       And   gentamicin (GARAMYCIN) 350 mg in dextrose 5 % 50 mL IVPB  5 mg/kg Intravenous 60 min Pre-Op Samantha Ruff, MD       clindamycin (CLEOCIN) 900 mg in dextrose 5 % 50 mL IVPB  900 mg Intravenous 60 min Pre-Op Samantha Ruff, MD       And   gentamicin (GARAMYCIN) 5 mg/kg in dextrose 5 % 50 mL IVPB  5 mg/kg Intravenous 60 min Pre-Op Samantha Ruff, MD       sodium chloride 0.9 % 1,000 mL with potassium chloride 20 mEq, magnesium sulfate 2 g infusion   Intravenous Once Samantha Jack, MD        ALLERGIES:  Allergies  Allergen Reactions   Demerol [Meperidine] Itching    All over the body.   Penicillins Hives     childhood, does not remember if it spread all over the body or not. Did it involve swelling of the face/tongue/throat, SOB, or low BP? No Did it involve sudden or severe rash/hives, skin peeling, or any reaction on the inside of your mouth or nose? Yes Did you need to seek medical attention at a hospital or doctor's office? Yes When did it last happen?      childhood allergy If all above answers are "NO", may proceed with cephalosporin use.    Levaquin [Levofloxacin] Other (See Comments)    Patient has Aortic Aneurysm and is not indicated with this diagnosis   Other Hives and Other (See Comments)    Fresh coconut    Vancomycin Rash    Will need Benadryl prior to administration per IV. "Red man syndrome"    PHYSICAL EXAM:  Performance status (ECOG): 0 - Asymptomatic  Vitals:   08/12/21 1513  BP: 133/85  Pulse: 95  Resp: 18  Temp: (!) 97.5 F (36.4 C)   SpO2: 100%   Wt Readings from Last 3 Encounters:  08/12/21 137 lb (62.1 kg)  07/21/21 132 lb 11.5 oz (60.2 kg)  07/15/21 132 lb 12.8 oz (60.2 kg)   Physical Exam   LABORATORY DATA:  I have reviewed the labs as listed.     Latest Ref Rng & Units 08/02/2021    2:04 PM 05/11/2021    9:18 AM 11/05/2020    9:00 AM  CBC  WBC 4.0 - 10.5 K/uL 3.2   3.0   2.1    Hemoglobin 12.0 - 15.0 g/dL 12.2   12.1   10.7    Hematocrit 36.0 - 46.0 % 34.9   35.0   30.6    Platelets 150 - 400 K/uL 109   100   83        Latest Ref Rng & Units 08/02/2021    2:04 PM 07/21/2021   10:28 AM 05/11/2021    9:18 AM  CMP  Glucose 70 - 99 mg/dL 97   100   91    BUN 6 - 20 mg/dL _0 Creatinine 0.44 - 1.00 mg/dL 0.93   0.90   0.83    Sodium 135 - 145 mmol/L 136   139   135    Potassium 3.5 - 5.1 mmol/L 3.5   3.1   3.3    Chloride 98 - 111 mmol/L 103   101   102    CO2 22 - 32 mmol/L 26   29  21    Calcium 8.9 - 10.3 mg/dL 8.9   8.8   8.7    Total Protein 6.5 - 8.1 g/dL 7.7   7.3   7.3    Total Bilirubin 0.3 - 1.2 mg/dL 2.1   3.4   2.7    Alkaline Phos 38 - 126 U/L 78   82   106    AST 15 - 41 U/L _0 ALT 0 - 44 U/L _1 DIAGNOSTIC IMAGING:  I have independently reviewed the scans and discussed with the patient. CT Chest W Contrast  Result Date: 08/10/2021 CLINICAL DATA:  A 56 year old female presents for evaluation of pulmonary nodules in the setting of metastatic disease, history of colon cancer. * Tracking Code: BO * EXAM: CT CHEST WITH CONTRAST TECHNIQUE: Multidetector CT imaging of the chest was performed during intravenous contrast administration. RADIATION DOSE REDUCTION: This exam was performed according to the departmental dose-optimization program which includes automated exposure control, adjustment of the mA and/or kV according to patient size and/or use of iterative reconstruction technique. CONTRAST:  5m OMNIPAQUE IOHEXOL 300 MG/ML  SOLN COMPARISON:  May 12, 2021. FINDINGS: Cardiovascular: Dilated thoracic aorta with aneurysmal caliber at 4.4 cm is unchanged. Normal heart size without substantial pericardial effusion. Central pulmonary vessels unremarkable on venous phase assessment. RIGHT-sided Port-A-Cath terminates in the distal superior vena cava. Mediastinum/Nodes: No thoracic inlet lymphadenopathy. No axillary lymphadenopathy. Mild fullness of subcarinal nodal tissue at 10 mm is within 1 mm of previous imaging. No RIGHT hilar adenopathy. Juxta hilar nodule in the LEFT lower lobe, see below. Lungs/Pleura: Parenchymal scarring in the RIGHT upper lobe. Central area of nodularity measuring 8 mm (image 34/4) this may be slightly smaller previously 10 mm. Bandlike ground-glass surrounds this area. Central pulmonary nodule nearly 3 cm in the LEFT lower lobe measuring 2.9 x 2.1 cm (image 84/2) (image 83/4) associated with bronchial narrowing. Previously measuring approximately 2.6 x 1.8 cm nodular branching areas that extend to the periphery are increasing along the pleural surface in the LEFT lower lobe. Frankly nodular area (image 79/4) medial LEFT lower lobe 11 x 10 mm previously less than a cm. Tubular areas that extend peripheral to the mass lesion in the LEFT lower lobe are similarly increased in size now with soft tissue extending from the nodule to the pleural surface. Nodular area along the pleural surface in the LEFT lower lobe (image 108/4) 15 x 10 mm previously 15 x 8 mm. Peripheral basilar nodularity in the LEFT costodiaphragmatic sulcus is unchanged (image 140/4) this shows diminished size when compared to imaging dating back to April of 2022. No new nodules elsewhere in the chest. (Image 131/6) 3-4 mm nodule previously approximately 2 mm in the LEFT upper lobe. Upper Abdomen: No acute findings in the upper abdomen. Postoperative changes with surgical clips about the porta hepatis from prior cholecystectomy similar to previous imaging. Low-density lesion  in the RIGHT hemiliver (image 159/2) 1.8 x 1.9 cm previously 2.2 x 1.9 cm. Splenomegaly is incompletely evaluated with stable well-circumscribed low-density lesion in the spleen measuring approximately 18 mm. Normal adrenal glands. No upper abdominal lymphadenopathy. Musculoskeletal: No acute bone finding. No destructive bone process. Spinal degenerative changes. IMPRESSION: 1. Enlarging LEFT lower lobe pulmonary nodule and tubular and irregular areas of nodularity and discrete nodularity at the medial aspect of the RIGHT lower lobe. Findings suspicious for worsening of disease and  endobronchial dissemination of tumor. 2. Tiny nodule in the LEFT upper lobe with slight increase in size may reflect additional site of disease. Attention on follow-up 3. Ground-glass surrounding nodularity in the RIGHT upper lobe may reflect post treatment changes, central nodule slightly decreased in size. 4. Decreasing size of RIGHT hepatic lobe metastasis. 5. 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 Attention on follow-up imaging or follow-up in 1 year as warranted. Aortic Atherosclerosis (ICD10-I70.0). Electronically Signed   By: Zetta Bills M.D.   On: 08/10/2021 16:25     ASSESSMENT:  1.  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  Liver biopsy on 03/09/2018 consistent with adenocarcinoma. 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 -7 cycles of FOLFOX with vectibix completed on 06/27/2018. -XRT with Xeloda from 07/30/2018 through 09/06/2018. -Right liver lesion microwave [facility] 10/15/2018 [facility]. -Low anterior resection and diverting ileostomy on 12/07/2018, pathology [id number], 0/6 lymph nodes positive, margins negative. -Loop ileostomy reversal on 04/17/2019. -CTAP on 07/10/2019 showed left lower lobe pulmonary nodule increased by 1 mm, measuring 8 mm.  3 mm left upper lobe lung nodule is unchanged.  Probable 4 mm  right apical pulmonary nodule felt to be enlarged from 2 mm from October 2020.  Vague hypoattenuation within the posterior right hepatic lobe measures 1.5 cm, measuring 1.2 cm in October 2020.  Subtle subcapsular right hepatic lobe hypoattenuating 1 cm lesion was not seen on prior scans. -We reviewed results of MRI of the abdomen with and without contrast from 07/16/2019.  2 enhancing lesions in the right hepatic lobe, largest one measuring 1.5 x 1.0 cm.  Inferior lesion is measured as 1 cm.  Stable splenomegaly. -CEA is 2.4. -Microwave ablation of the right hepatic lobe lesion by Dr. Maudie Mercury around 08/15/2019. -SBRT to the left lower lobe lung lesion and left liver lobe lesion on 12/13/2019 at [facility]. -CT CAP from 01/22/2020 showed interval enlargement of medial right pulmonary nodule measuring about 7 mm, previously 5 mm.  Evaluation of the new lesion in the left lobe of the liver is limited by metallic fiducial marking clips although lesion appears to be diminished.  Low attenuation ablation sites in the right lobe of the liver unchanged.  Left lower lobe lung lesion also decreased in size. - CT CAP on 04/01/2020 showed stable 7 mm right upper lobe lung nodule.  Left lower lobe nodule/scarring is stable.  2 ablation defects in the posterior and inferior right hepatic lobe are stable in appearance without evidence of residual contrast-enhancement.  2 cm cystic lesion in the anterior aspect of the spleen is stable.  Stable mild splenomegaly. - Right upper lobe lesion SBRT from 08/06/2020 through 08/13/2020.   2.  Hyperbilirubinemia: -Total bilirubin is 5.1.  She has Gilbert's syndrome.  Baseline is between 2 and 3.   3.  Hypothyroidism: -She is on Armour Thyroid.  TSH is 3.6.   4.  Weight loss: -She had 10 pound weight loss since her last visit 3 months ago.  However she had ileostomy reversal and is adjusting to new food habits.     PLAN:  1.  Stage IV rectal adenocarcinoma: - She denies any new  onset pains. - Reviewed CT chest with contrast from 08/09/2021 which shows enlarging left lower lobe lung nodule and left upper lobe lung nodule.  Decreasing size of the right hepatic lobe metastasis. - CEA is 2.4, within normal limits although increased from previous baseline.  LFTs  are normal. - Recommend PET CT scan.  If there is any increased uptake in the lung nodules, will refer to radiation oncology.  We will talk to her on the phone after the PET scan.   2.  Hyperbilirubinemia: - She has Gilbert's syndrome.  Total bilirubin is 2.1.   3.  Weight loss: - She has been off of Remeron.  She is eating well and gained 10 pounds.   4.  Hypothyroidism: - Continue Armour Thyroid 60 mcg daily.   5.  Leukopenia and thrombocytopenia: - She has intermittent mild leukopenia and thrombocytopenia.  Spleen size is mildly enlarged. - Nutritional deficiency work-up is negative.   Orders placed this encounter:  No orders of the defined types were placed in this encounter.    Samantha Jack, MD [place] [phone]   I, Thana Ates, am acting as a scribe for Dr. Derek Reynolds.  I, Samantha Jack MD, have reviewed the above documentation for accuracy and completeness, and I agree with the above.

## 2021-08-12 NOTE — Patient Instructions (Signed)
[place] at [facility] Discharge Instructions  You were seen and examined today by Dr. Delton Coombes.  Dr. Delton Coombes discussed your most recent lab work and CT scan which revealed that you still have two spots that have not really grown but are more solid looking than previous scan and a lymph node. It is hard to differentiate between cancer and normal radiation changes by this scan. Dr. Delton Coombes recommends doing a PET CT scan.  Follow-up as scheduled.    Thank you for choosing [place] at [facility] to provide your oncology and hematology care.  To afford each patient quality time with our provider, please arrive at least 15 minutes before your scheduled appointment time.   If you have a lab appointment with the [facility] please come in thru the Main Entrance and check in at the main information desk.  You need to re-schedule your appointment should you arrive 10 or more minutes late.  We strive to give you quality time with our providers, and arriving late affects you and other patients whose appointments are after yours.  Also, if you no show three or more times for appointments you may be dismissed from the clinic at the providers discretion.     Again, thank you for choosing [facility].  Our hope is that these requests will decrease the amount of time that you wait before being seen by our physicians.       _____________________________________________________________  Should you have questions after your visit to [facility], please contact our office at [phone] and follow the prompts.  Our office hours are 8:00 a.m. and 4:30 p.m. Monday - Friday.  Please note that voicemails left after 4:00 p.m. may not be returned until the following business day.  We are closed weekends and major holidays.  You do have access to a nurse 24-7, just call the main number to the clinic [phone] and do not press any  options, hold on the line and a nurse will answer the phone.    For prescription refill requests, have your pharmacy contact our office and allow 72 hours.    Due to Covid, you will need to wear a mask upon entering the hospital. If you do not have a mask, a mask will be given to you at the Main Entrance upon arrival. For doctor visits, patients may have 1 support person age 66 or older with them. For treatment visits, patients can not have anyone with them due to social distancing guidelines and our immunocompromised population.

## 2021-08-19 ENCOUNTER — Ambulatory Visit (HOSPITAL_COMMUNITY)
Admission: RE | Admit: 2021-08-19 | Discharge: 2021-08-19 | Disposition: A | Discharge status: Home / Self Care | Payer: 59 | Source: Ambulatory Visit | Attending: Hematology | Admitting: Hematology

## 2021-08-19 DIAGNOSIS — C787 Secondary malignant neoplasm of liver and intrahepatic bile duct: Secondary | ICD-10-CM | POA: Insufficient documentation

## 2021-08-19 DIAGNOSIS — C20 Malignant neoplasm of rectum: Secondary | ICD-10-CM | POA: Diagnosis not present

## 2021-08-19 DIAGNOSIS — C189 Malignant neoplasm of colon, unspecified: Secondary | ICD-10-CM | POA: Diagnosis not present

## 2021-08-19 DIAGNOSIS — I700 Atherosclerosis of aorta: Secondary | ICD-10-CM | POA: Diagnosis not present

## 2021-08-19 DIAGNOSIS — D734 Cyst of spleen: Secondary | ICD-10-CM | POA: Diagnosis not present

## 2021-08-19 DIAGNOSIS — C19 Malignant neoplasm of rectosigmoid junction: Secondary | ICD-10-CM | POA: Insufficient documentation

## 2021-08-19 DIAGNOSIS — I712 Thoracic aortic aneurysm, without rupture, unspecified: Secondary | ICD-10-CM | POA: Diagnosis not present

## 2021-08-19 MED ORDER — FLUDEOXYGLUCOSE F - 18 (FDG) INJECTION
7.3100 | Freq: Once | INTRAVENOUS | Status: AC | PRN
  Administered 2021-08-19: 7.31 via INTRAVENOUS

## 2021-08-25 ENCOUNTER — Encounter (HOSPITAL_COMMUNITY): Payer: Self-pay | Admitting: Hematology

## 2021-08-25 ENCOUNTER — Inpatient Hospital Stay (HOSPITAL_BASED_OUTPATIENT_CLINIC_OR_DEPARTMENT_OTHER): Payer: 59 | Admitting: Hematology

## 2021-08-25 DIAGNOSIS — C20 Malignant neoplasm of rectum: Secondary | ICD-10-CM

## 2021-08-25 NOTE — Progress Notes (Signed)
Virtual Visit via Telephone Note  I connected with Samantha Reynolds on 08/25/21 at  4:00 PM EDT by telephone and verified that I am speaking with the correct person using two identifiers.  Location: Patient: At home Provider: In the office   I discussed the limitations, risks, security and privacy concerns of performing an evaluation and management service by telephone and the availability of in person appointments. I also discussed with the patient that there may be a patient responsible charge related to this service. The patient expressed understanding and agreed to proceed.   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  She had SBRT of the left lower lobe lung lesion in September 2021.  Right upper lobe lung lesion SBRT was done in May 2022.   Observations/Objective: She reports hoarseness in her voice for the past few days from laryngitis.  She has an appointment to see her PCP on Friday.  Does not report any other symptoms.  Assessment and Plan:  1.  Stage IV rectal adenocarcinoma to the liver and lungs: - I have reviewed the PET scan findings with the patient and her husband. - Left lower lobe lung nodule measuring 2.8 x 2.3 cm (SUV 13.5).  Additional 12 mm nodule in the left lower lobe with SUV 6.9, 14 mm nodule in the posterior left lower lobe with SUV 6.3 suspicious for metastatic disease.  12 mm subcarinal lymph node with SUV 7.7 compatible with nodal mets. - We talked about further management options including systemic chemotherapy.  However I have recommended to get an opinion from Dr. Lisbeth Renshaw to see if all these areas can be treated with SBRT and potentially delay initiation of systemic therapy. - We also talked about additional subcentimeter nodules in the left lower lobe which could potentially get bigger in the future. - If she is a candidate for further radiation, will consider rescanning of her  chest in 3 months after completing treatment. - She is agreeable.  We will make referral to radiation oncology.   Follow Up Instructions: RTC 4 months with CT scan of the chest with contrast and labs.   I discussed the assessment and treatment plan with the patient. The patient was provided an opportunity to ask questions and all were answered. The patient agreed with the plan and demonstrated an understanding of the instructions.   The patient was advised to call back or seek an in-person evaluation if the symptoms worsen or if the condition fails to improve as anticipated.  I provided 22 minutes of non-face-to-face time during this encounter.   Derek Jack, MD

## 2021-08-26 ENCOUNTER — Other Ambulatory Visit (HOSPITAL_COMMUNITY): Payer: Self-pay

## 2021-08-26 DIAGNOSIS — C189 Malignant neoplasm of colon, unspecified: Secondary | ICD-10-CM

## 2021-08-26 DIAGNOSIS — C20 Malignant neoplasm of rectum: Secondary | ICD-10-CM

## 2021-08-26 NOTE — Progress Notes (Signed)
Message from Dr. Delton Coombes- Can you please order CT chest, abdomen and pelvis with contrast, CEA, CBC, CMP in 4 months.  Thank you.  Orders placed for CT chest, abdomen and pelvis with contrast per Dr. Tomie China orders.

## 2021-08-27 ENCOUNTER — Ambulatory Visit: Payer: 59 | Admitting: Nurse Practitioner

## 2021-08-30 ENCOUNTER — Encounter: Payer: Self-pay | Admitting: Radiation Oncology

## 2021-08-30 NOTE — Progress Notes (Signed)
Telephone appointment. I spoke w/ patient's spouse Mr. Samantha Reynolds, verified his identity and began nursing interview. Mr. [name] reports that patient is doing well but has some heightened anxiety/stress (10/10) about her health concerns. No other issues reported at this time.  Meaningful use complete. Postmenopausal- NO chances of pregnancy.  Reminded patient's spouse of her 8:30am-08/31/21 telephone "Reconsult" appointment w/ Shona Simpson PA-C. I left my extension [phone] in case patient needs anything.  Patient contact [phone]

## 2021-08-31 ENCOUNTER — Ambulatory Visit
Admission: RE | Admit: 2021-08-31 | Discharge: 2021-08-31 | Disposition: A | Discharge status: Home / Self Care | Payer: 59 | Source: Ambulatory Visit | Attending: Radiation Oncology | Admitting: Radiation Oncology

## 2021-08-31 DIAGNOSIS — D511 Vitamin B12 deficiency anemia due to selective vitamin B12 malabsorption with proteinuria: Secondary | ICD-10-CM | POA: Diagnosis not present

## 2021-08-31 DIAGNOSIS — E860 Dehydration: Secondary | ICD-10-CM | POA: Diagnosis not present

## 2021-08-31 DIAGNOSIS — E063 Autoimmune thyroiditis: Secondary | ICD-10-CM | POA: Diagnosis not present

## 2021-08-31 DIAGNOSIS — E890 Postprocedural hypothyroidism: Secondary | ICD-10-CM | POA: Diagnosis not present

## 2021-08-31 DIAGNOSIS — C73 Malignant neoplasm of thyroid gland: Secondary | ICD-10-CM | POA: Diagnosis not present

## 2021-08-31 DIAGNOSIS — C20 Malignant neoplasm of rectum: Secondary | ICD-10-CM

## 2021-08-31 NOTE — Progress Notes (Signed)
Radiation Oncology         [phone] ________________________________  Outpatient Re-Consultation - Conducted via telephone at patient request.  I spoke with the patient to conduct this consult visit via telephone to spare the patient unnecessary potential exposure in the healthcare setting during the current COVID-19 pandemic. The patient was notified in advance and was offered an in person or telemedicine meeting to allow for face to face communication but instead preferred to proceed with a telephone visit.     Name: Samantha Reynolds MRN: [id number]  Date of Service: 08/31/2021  DOB: May 04, 1965   Diagnosis:  Progressive Metastatic Adenocarcinoma of the rectum.  Interval Since Last Radiation:    08/06/20-08/13/20 SBRT Treatment: The tumor in the RUL was treated with a course of stereotactic body radiation treatment. The patient received 54 Gy In 3 fractions at 18 G per fraction.  12/04/2019-12/13/2019 SBRT Treatment: The patient was treated to the liver with a course of stereotactic body radiation treatment.  The patient received 60 Gray in 5 fractions using a SBRT/IMRT technique, with 3 fields.  11/15/19 - 11/22/19 SBRT Treatment: The patient was treated to the left lower lobe with a course of stereotactic body radiation treatment.  The patient received 54 Gray in 3 fractions using a SBRT/IMRT technique, with 3 fields.  07/30/2018-09/06/2018: The rectum was treated to 45 Gy in 25 fractions, and 5.4 Gy boost in 3 fractions  Narrative:  The patient is a delightful 56 y.o. female with a history of Stage IV, [phone] adenocarcinoma of the rectum. Given her good performance status, she received total neoadjuvant chemotherapy and completed this in April 2020 followed by chemoRT in the spring of 2020. She subsequently underwent robotic LAR with loop ileostomy in September 2020 with 2 cm of residual disease but negative nodes and margins. She had reversal of the ileostomy in January 2021, also  microwave ablation to a right hepatic lesion by Dr. Maudie Mercury at [facility] in May 2021. She had another lesion that was difficult to access for ablation. In the interim of additional imaging, a lesion in the left lung was noted and treated with SBRT in August 2021. She also was able to undergo SBRT to the liver lesion in September 2021.  She developed additional lung disease and was treated to a RUL target in the lung in May of 2022. She's followed by Dr. Delton Coombes and has remained off chemotherapy since 2020, but has been followed in surveillance. She had recent CT Chest on 08/09/21 that showed an enlarging LLL nodule measuring 2.9 cm, previously 2.6 cm and a medial LLL 11 mm lesion previously less than 1 cm. There was also discrete nodularity in the medial RLL, called but we believe this to be reference to the LLL nodularity measuring 11 mm. A tiny nodule in the LUL measuring 3-4 mm, 2 mm previously.  By PET scan on 08/19/2021, the central left lower lobe metastasis measuring 2.8 cm had an SUV of 13.5, the scattered left lower lobe nodules including the 12 mm lesion in the medial aspect and 14 mm nodule in the posterior left lower lobe had an SUV of 6.9 and 6.3.  The right medial lung apex 7 mm nodule had an SUV of 2.3 and was felt to be not suspicious.  Additional subcentimeter nodules in the left lower lobe were beneath the threshold for PET sensitivity.  A 12 mm subcarinal node was also seen with an SUV of 7.7 concerning for nodal metastasis.  She is contacted by  phone today to discuss potential options of additional radiotherapy.  On review of systems, the patient is doing much better since her last treatment in terms of quality of life. She can leave the house and can manage her bowel function based on timing of day when she can anticipate her daily bowel movements. She has had som persistent phlegm and post nasal drip. She is not having any trouble breathing. No other complaints are verbalized.   PAST MEDICAL  HISTORY:  Past Medical History:  Diagnosis Date   Anxiety    Cancer ([place]) 2013   thyroid   Endometrial polyp    Endometrial thickening on ultra sound    GERD (gastroesophageal reflux disease)    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   Worse in her 24s when she was ill   H/O Hashimoto thyroiditis    History of chemotherapy    History of radiation therapy    History of thyroid cancer no recurrence   2013--  s/p  left lobe thyroidectomy--  follicular varient papillary / lymphocyctic thyroiditis   Hyperlipidemia 11/09/2015   Hypothyroidism    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[place]) 02/26/2018    PAST SURGICAL HISTORY: Past Surgical History:  Procedure Laterality Date   BIOPSY  02/19/2018   Procedure: BIOPSY;  Surgeon: Rogene Houston, MD;  [facility];  Service: Endoscopy;;  rectum   COLONOSCOPY N/A 08/20/2015   Procedure: COLONOSCOPY;  Surgeon: Rogene Houston, MD;  [facility];  Service: Endoscopy;  Laterality: N/A;  730   COLONOSCOPY WITH PROPOFOL N/A 07/21/2021   Procedure: COLONOSCOPY WITH PROPOFOL;  Surgeon: Rogene Houston, MD;  [facility];  Service: Endoscopy;  Laterality: N/A;  10:10   D & C HYSTEROOSCOPY W/ THERMACHOICE ENDOMETRIAL ABLATION  08-13-2004   DIVERTING ILEOSTOMY N/A 12/07/2018   Procedure: DIVERTING LOOP ILEOSTOMY;  Surgeon: Leighton Ruff, MD;  [facility];  Service: General;  Laterality: N/A;   FLEXIBLE SIGMOIDOSCOPY N/A 02/19/2018   Procedure: FLEXIBLE SIGMOIDOSCOPY;  Surgeon: Rogene Houston, MD;  [facility];  Service: Endoscopy;  Laterality: N/A;   HYSTEROSCOPY WITH D & C N/A 04/30/2015   Procedure: DILATATION AND CURETTAGE / INTENDED HYSTEROSCOPY;  Surgeon: Dian Queen, MD;  Location: [place];  Service: Gynecology;  Laterality: N/A;   ILEOSTOMY CLOSURE N/A 04/17/2019   Procedure: LOOP ILEOSTOMY REVERSAL;  Surgeon: Leighton Ruff, MD;  [facility];  Service: General;  Laterality: N/A;   IR US  GUIDE BX ASP/DRAIN  03/09/2018   LAPAROSCOPIC CHOLECYSTECTOMY  04/1996   LAPAROSCOPY N/A 12/11/2018   Procedure: LAPAROSCOPY  ILEOSTOMY REVISION AND ABDOMINAL WASHOUT;  Surgeon: Leighton Ruff, MD;  [facility];  Service: General;  Laterality: N/A;   Liver Microwave   10/17/2018   POLYPECTOMY  08/20/2015   Procedure: POLYPECTOMY;  Surgeon: Rogene Houston, MD;  [facility];  Service: Endoscopy;;  Splenic flexure polypectomy   POLYPECTOMY  02/19/2018   Procedure: POLYPECTOMY;  Surgeon: Rogene Houston, MD;  [facility];  Service: Endoscopy;;  rectum   PORTACATH PLACEMENT N/A 03/14/2018   Procedure: INSERTION PORT-A-CATH;  Surgeon: Aviva Signs, MD;  [facility];  Service: General;  Laterality: N/A;   REDUCTION INCARCERATED UTERUS  06-20-2000   intrauterine preg. 13 wks /  urinary retention   THYROID LOBECTOMY  11/24/2011   Procedure: THYROID LOBECTOMY;  Surgeon: Earnstine Regal, MD;  [facility];  Service: General;  Laterality: Left;  Left Thyroid  Lobectomy   TUBAL LIGATION  2002   XI ROBOTIC ASSISTED LOWER ANTERIOR RESECTION N/A 12/07/2018   Procedure: XI ROBOTIC ASSISTED LOWER ANTERIOR RESECTION, SPENIC FLEXURE IMMOBILIZATION, COLOANAL ANASTOMOSIS, RIGID PROCTOSCOPY;  Surgeon: Leighton Ruff, MD;  [facility];  Service: General;  Laterality: N/A;    PAST SOCIAL HISTORY:  Social History   Socioeconomic History   Marital status: Married    Spouse name: Not on file   Number of children: 4   Years of education: Not on file   Highest education level: Not on file  Occupational History    Comment: Marketing executive at [employer]   Smoking status: Former    Packs/day: 0.25    Years: 10.00    Pack years: 2.50    Types: Cigarettes    Quit date: 10/31/1991    Years since quitting: 29.8   Smokeless tobacco: Never  Vaping Use   Vaping Use: Never used  Substance and Sexual Activity   Alcohol use: No   Drug use: No   Sexual activity: Not  Currently  Other Topics Concern   Not on file  Social History Narrative   Lives with husband [name] and 46 year old son [name]   Husband is Interior and spatial designer of [employer]   Social Determinants of Radio broadcast assistant Strain: Not on file  Food Insecurity: Not on file  Transportation Needs: Not on file  Physical Activity: Not on file  Stress: Not on file  Social Connections: Not on file  Intimate Partner Violence: Not on file    PAST FAMILY HISTORY: Family History  Problem Relation Age of Onset   Hypertension Mother    Hypertension Father    Prostate cancer Father    Cancer Maternal Aunt        spine/back   Cancer Paternal Grandfather        lung   Healthy Son    Healthy Son    Healthy Son    Healthy Son    Colon cancer Neg Hx     MEDICATIONS  Current Outpatient Medications  Medication Sig Dispense Refill   ALPRAZolam (XANAX) 0.5 MG tablet Take 1 tablet (0.5 mg total) by mouth 3 (three) times daily as needed for anxiety. 90 tablet 1   amitriptyline (ELAVIL) 10 MG tablet Take 1 tablet (10 mg total) by mouth 2 (two) times daily. 180 tablet 3   ARMOUR THYROID 60 MG tablet Take 1 tablet by mouth  daily except on Monday & Thursday take 1 & 1/2 tablets 120 tablet 1   COVID-19 At Home Antigen Test (CARESTART COVID-19 HOME TEST) KIT Use as directed 4 each 0   loperamide (IMODIUM) 2 MG capsule Take 1 capsule (2 mg total) by mouth 3 (three) times daily. (Patient taking differently: Take 2 mg by mouth daily.) 30 capsule 0   mirtazapine (REMERON) 15 MG tablet Take 1 tablet (15 mg total) by mouth at bedtime. 30 tablet 3   potassium chloride SA (KLOR-CON M) 20 MEQ tablet Take 1 tablet (20 mEq total) by mouth daily. 30 tablet 1   prochlorperazine (COMPAZINE) 10 MG tablet Take 1 tablet (10 mg total) by mouth every 6 (six) hours as needed for nausea or vomiting. 60 tablet 3   thyroid (ARMOUR) 60 MG tablet Take 60 mg by mouth daily.     No current facility-administered medications for  this encounter.   Facility-Administered Medications Ordered in Other Encounters  Medication Dose Route Frequency Provider Last  Rate Last Admin   clindamycin (CLEOCIN) 900 mg in dextrose 5 % 50 mL IVPB  900 mg Intravenous 60 min Pre-Op Leighton Ruff, MD       And   gentamicin (GARAMYCIN) 350 mg in dextrose 5 % 50 mL IVPB  5 mg/kg Intravenous 60 min Pre-Op Leighton Ruff, MD       clindamycin (CLEOCIN) 900 mg in dextrose 5 % 50 mL IVPB  900 mg Intravenous 60 min Pre-Op Leighton Ruff, MD       And   gentamicin (GARAMYCIN) 5 mg/kg in dextrose 5 % 50 mL IVPB  5 mg/kg Intravenous 60 min Pre-Op Leighton Ruff, MD       sodium chloride 0.9 % 1,000 mL with potassium chloride 20 mEq, magnesium sulfate 2 g infusion   Intravenous Once Derek Jack, MD        ALLERGIES:  Allergies  Allergen Reactions   Demerol [Meperidine] Itching    All over the body.   Penicillins Hives     childhood, does not remember if it spread all over the body or not. Did it involve swelling of the face/tongue/throat, SOB, or low BP? No Did it involve sudden or severe rash/hives, skin peeling, or any reaction on the inside of your mouth or nose? Yes Did you need to seek medical attention at a hospital or doctor's office? Yes When did it last happen?      childhood allergy If all above answers are "NO", may proceed with cephalosporin use.    Levaquin [Levofloxacin] Other (See Comments)    Patient has Aortic Aneurysm and is not indicated with this diagnosis   Other Hives and Other (See Comments)    Fresh coconut    Vancomycin Rash    Will need Benadryl prior to administration per IV. "Red man syndrome"   Physical Exam: Wt Readings from Last 3 Encounters:  08/12/21 137 lb (62.1 kg)  07/21/21 132 lb 11.5 oz (60.2 kg)  07/15/21 132 lb 12.8 oz (60.2 kg)   Temp Readings from Last 3 Encounters:  08/12/21 (!) 97.5 F (36.4 C) (Tympanic)  07/21/21 98 F (36.7 C) (Oral)  05/17/21 98 F (36.7 C) (Oral)   BP  Readings from Last 3 Encounters:  08/12/21 133/85  07/21/21 104/77  05/17/21 127/82   Pulse Readings from Last 3 Encounters:  08/12/21 95  07/21/21 98  05/17/21 (!) 102   Unable to assess due to encounter type   Impression/Plan: 1. Stage IV, [id number], progressive adenocarcinoma of the rectum. Dr. Lisbeth Renshaw discusses the patient's recent imaging and scans to date. He is concerned about the nodal disease and multifocal sites in her lung and while systemic chemotherapy would be an option, she and her oncologist Dr. Delton Coombes prefer to try to delay systemic chemotherapy, and are interested in local options. Dr. Lisbeth Renshaw has reviewed her imaging and feels that she would either have options for a palliative course of therapy versus ultrahypofractionated radiotherapy which would still be a dose escalation compared to a conventional palliative approach, but not as high a dose as SBRT as she's had in the past. We discussed the differences in both therapies, and she is interested in still being as aggressive as is appropriate. At the conclusion, she will proceed with ultrahypofractionated radiotherapy. We discussed the risks, benefits, short, and long term effects of radiotherapy, as well as the curative intent, and the patient is interested in proceeding. Dr. Lisbeth Renshaw discusses the delivery and logistics of radiotherapy and anticipates a course of 8-10  fractions of ultrahypofractionated radiotherapy to her larger left lung nodules and subcarinal node which would be 3-4 isocenters. She is aware that at some point she will likely need systemic therapy.  The patient will be contacted to coordinate treatment planning by our simulation department. She will sign consent at the time of simulation.      This encounter was conducted via telephone.  The patient has provided two factor identification and has given verbal consent for this type of encounter and has been advised to only accept a meeting of this type in a  secure network environment. The time spent during this encounter was 45 minutes including preparation, discussion, and coordination of the patient's care. The attendants for this meeting include Dr. Lisbeth Renshaw, Hayden Pedro  and Josue Hector. During the encounter,  Dr. Lisbeth Renshaw, and Hayden Pedro were located at [facility] Radiation Oncology Department.  Samantha Reynolds was located at home.       Carola Rhine, PAC

## 2021-09-01 DIAGNOSIS — C7802 Secondary malignant neoplasm of left lung: Secondary | ICD-10-CM | POA: Diagnosis not present

## 2021-09-01 DIAGNOSIS — C20 Malignant neoplasm of rectum: Secondary | ICD-10-CM | POA: Diagnosis not present

## 2021-09-02 ENCOUNTER — Other Ambulatory Visit (HOSPITAL_COMMUNITY): Payer: Self-pay

## 2021-09-02 ENCOUNTER — Encounter: Payer: Self-pay | Admitting: Family Medicine

## 2021-09-02 ENCOUNTER — Other Ambulatory Visit: Payer: Self-pay

## 2021-09-02 ENCOUNTER — Ambulatory Visit: Payer: Self-pay | Admitting: Family Medicine

## 2021-09-02 MED ORDER — ALPRAZOLAM 0.5 MG PO TABS
0.5000 mg | ORAL_TABLET | Freq: Three times a day (TID) | ORAL | 1 refills | Status: DC | PRN
  Filled 2021-09-02: qty 90, 30d supply, fill #0

## 2021-09-02 NOTE — Telephone Encounter (Signed)
Encourage patient to contact the pharmacy for refills or they can request refills through [facility]  (Please schedule appointment if patient has not been seen in over a year)    WHAT PHARMACY WOULD THEY LIKE THIS SENT TO: [facility]) 0.5 MG tablet   NOTES/COMMENTS FROM PATIENT:Pt med check appt rescheduled per Dr Nicki Reaper to July 3rd       Front office please notify patient: It takes 48-72 hours to process rx refill requests Ask patient to call pharmacy to ensure rx is ready before heading there.

## 2021-09-07 ENCOUNTER — Ambulatory Visit
Admission: RE | Admit: 2021-09-07 | Discharge: 2021-09-07 | Disposition: A | Discharge status: Home / Self Care | Payer: 59 | Source: Ambulatory Visit | Attending: Radiation Oncology | Admitting: Radiation Oncology

## 2021-09-07 ENCOUNTER — Other Ambulatory Visit: Payer: Self-pay

## 2021-09-07 DIAGNOSIS — C7800 Secondary malignant neoplasm of unspecified lung: Secondary | ICD-10-CM | POA: Insufficient documentation

## 2021-09-07 DIAGNOSIS — C7801 Secondary malignant neoplasm of right lung: Secondary | ICD-10-CM | POA: Insufficient documentation

## 2021-09-07 DIAGNOSIS — Z923 Personal history of irradiation: Secondary | ICD-10-CM | POA: Insufficient documentation

## 2021-09-07 DIAGNOSIS — C20 Malignant neoplasm of rectum: Secondary | ICD-10-CM | POA: Insufficient documentation

## 2021-09-07 DIAGNOSIS — Z510 Encounter for antineoplastic radiation therapy: Secondary | ICD-10-CM | POA: Diagnosis not present

## 2021-09-07 DIAGNOSIS — C7802 Secondary malignant neoplasm of left lung: Secondary | ICD-10-CM | POA: Insufficient documentation

## 2021-09-07 DIAGNOSIS — C787 Secondary malignant neoplasm of liver and intrahepatic bile duct: Secondary | ICD-10-CM | POA: Diagnosis not present

## 2021-09-09 ENCOUNTER — Other Ambulatory Visit (HOSPITAL_COMMUNITY): Payer: Self-pay

## 2021-09-09 DIAGNOSIS — C20 Malignant neoplasm of rectum: Secondary | ICD-10-CM

## 2021-09-09 DIAGNOSIS — C189 Malignant neoplasm of colon, unspecified: Secondary | ICD-10-CM

## 2021-09-09 NOTE — Progress Notes (Signed)
Lab orders placed per Dr. Katragadda 

## 2021-09-14 ENCOUNTER — Other Ambulatory Visit (HOSPITAL_COMMUNITY): Payer: Self-pay

## 2021-09-14 DIAGNOSIS — D511 Vitamin B12 deficiency anemia due to selective vitamin B12 malabsorption with proteinuria: Secondary | ICD-10-CM | POA: Diagnosis not present

## 2021-09-14 DIAGNOSIS — E063 Autoimmune thyroiditis: Secondary | ICD-10-CM | POA: Diagnosis not present

## 2021-09-14 DIAGNOSIS — E890 Postprocedural hypothyroidism: Secondary | ICD-10-CM | POA: Diagnosis not present

## 2021-09-14 DIAGNOSIS — C20 Malignant neoplasm of rectum: Secondary | ICD-10-CM | POA: Diagnosis not present

## 2021-09-14 DIAGNOSIS — C73 Malignant neoplasm of thyroid gland: Secondary | ICD-10-CM | POA: Diagnosis not present

## 2021-09-14 DIAGNOSIS — R636 Underweight: Secondary | ICD-10-CM | POA: Diagnosis not present

## 2021-09-14 MED ORDER — ARMOUR THYROID 60 MG PO TABS
60.0000 mg | ORAL_TABLET | Freq: Every day | ORAL | 1 refills | Status: DC
  Filled 2021-12-02: qty 90, 90d supply, fill #0

## 2021-09-20 ENCOUNTER — Ambulatory Visit: Payer: 59 | Admitting: Radiation Oncology

## 2021-09-20 DIAGNOSIS — C20 Malignant neoplasm of rectum: Secondary | ICD-10-CM | POA: Diagnosis not present

## 2021-09-20 DIAGNOSIS — C7802 Secondary malignant neoplasm of left lung: Secondary | ICD-10-CM | POA: Diagnosis not present

## 2021-09-20 DIAGNOSIS — C7800 Secondary malignant neoplasm of unspecified lung: Secondary | ICD-10-CM | POA: Diagnosis not present

## 2021-09-20 DIAGNOSIS — C787 Secondary malignant neoplasm of liver and intrahepatic bile duct: Secondary | ICD-10-CM | POA: Diagnosis not present

## 2021-09-20 DIAGNOSIS — Z510 Encounter for antineoplastic radiation therapy: Secondary | ICD-10-CM | POA: Diagnosis not present

## 2021-09-21 ENCOUNTER — Other Ambulatory Visit: Payer: Self-pay

## 2021-09-21 ENCOUNTER — Ambulatory Visit
Admission: RE | Admit: 2021-09-21 | Discharge: 2021-09-21 | Disposition: A | Discharge status: Home / Self Care | Payer: 59 | Source: Ambulatory Visit | Attending: Radiation Oncology | Admitting: Radiation Oncology

## 2021-09-21 DIAGNOSIS — Z510 Encounter for antineoplastic radiation therapy: Secondary | ICD-10-CM | POA: Diagnosis not present

## 2021-09-21 DIAGNOSIS — C20 Malignant neoplasm of rectum: Secondary | ICD-10-CM | POA: Insufficient documentation

## 2021-09-21 DIAGNOSIS — C7802 Secondary malignant neoplasm of left lung: Secondary | ICD-10-CM | POA: Diagnosis not present

## 2021-09-21 LAB — RAD ONC ARIA SESSION SUMMARY
Course Elapsed Days: 0
Plan Fractions Treated to Date: 1
Plan Prescribed Dose Per Fraction: 5 Gy
Plan Total Fractions Prescribed: 10
Plan Total Prescribed Dose: 50 Gy
Reference Point Dosage Given to Date: 5 Gy
Reference Point Session Dosage Given: 5 Gy
Session Number: 1

## 2021-09-22 ENCOUNTER — Other Ambulatory Visit: Payer: Self-pay

## 2021-09-22 ENCOUNTER — Ambulatory Visit
Admission: RE | Admit: 2021-09-22 | Discharge: 2021-09-22 | Disposition: A | Discharge status: Home / Self Care | Payer: 59 | Source: Ambulatory Visit | Attending: Radiation Oncology | Admitting: Radiation Oncology

## 2021-09-22 DIAGNOSIS — C20 Malignant neoplasm of rectum: Secondary | ICD-10-CM | POA: Diagnosis not present

## 2021-09-22 DIAGNOSIS — C7802 Secondary malignant neoplasm of left lung: Secondary | ICD-10-CM | POA: Diagnosis not present

## 2021-09-22 DIAGNOSIS — Z510 Encounter for antineoplastic radiation therapy: Secondary | ICD-10-CM | POA: Diagnosis not present

## 2021-09-22 LAB — RAD ONC ARIA SESSION SUMMARY
Course Elapsed Days: 1
Plan Fractions Treated to Date: 2
Plan Prescribed Dose Per Fraction: 5 Gy
Plan Total Fractions Prescribed: 10
Plan Total Prescribed Dose: 50 Gy
Reference Point Dosage Given to Date: 10 Gy
Reference Point Session Dosage Given: 5 Gy
Session Number: 2

## 2021-09-23 ENCOUNTER — Other Ambulatory Visit: Payer: Self-pay

## 2021-09-23 ENCOUNTER — Ambulatory Visit
Admission: RE | Admit: 2021-09-23 | Discharge: 2021-09-23 | Disposition: A | Discharge status: Home / Self Care | Payer: 59 | Source: Ambulatory Visit | Attending: Radiation Oncology | Admitting: Radiation Oncology

## 2021-09-23 DIAGNOSIS — C20 Malignant neoplasm of rectum: Secondary | ICD-10-CM | POA: Diagnosis not present

## 2021-09-23 DIAGNOSIS — Z510 Encounter for antineoplastic radiation therapy: Secondary | ICD-10-CM | POA: Diagnosis not present

## 2021-09-23 DIAGNOSIS — C7802 Secondary malignant neoplasm of left lung: Secondary | ICD-10-CM | POA: Insufficient documentation

## 2021-09-23 LAB — RAD ONC ARIA SESSION SUMMARY
Course Elapsed Days: 2
Plan Fractions Treated to Date: 3
Plan Prescribed Dose Per Fraction: 5 Gy
Plan Total Fractions Prescribed: 10
Plan Total Prescribed Dose: 50 Gy
Reference Point Dosage Given to Date: 15 Gy
Reference Point Session Dosage Given: 5 Gy
Session Number: 3

## 2021-09-24 ENCOUNTER — Other Ambulatory Visit: Payer: Self-pay

## 2021-09-24 ENCOUNTER — Ambulatory Visit
Admission: RE | Admit: 2021-09-24 | Discharge: 2021-09-24 | Disposition: A | Discharge status: Home / Self Care | Payer: 59 | Source: Ambulatory Visit | Attending: Radiation Oncology | Admitting: Radiation Oncology

## 2021-09-24 DIAGNOSIS — C20 Malignant neoplasm of rectum: Secondary | ICD-10-CM | POA: Diagnosis not present

## 2021-09-24 DIAGNOSIS — Z510 Encounter for antineoplastic radiation therapy: Secondary | ICD-10-CM | POA: Insufficient documentation

## 2021-09-24 DIAGNOSIS — C7802 Secondary malignant neoplasm of left lung: Secondary | ICD-10-CM | POA: Diagnosis not present

## 2021-09-24 LAB — RAD ONC ARIA SESSION SUMMARY
Course Elapsed Days: 3
Plan Fractions Treated to Date: 4
Plan Prescribed Dose Per Fraction: 5 Gy
Plan Total Fractions Prescribed: 10
Plan Total Prescribed Dose: 50 Gy
Reference Point Dosage Given to Date: 20 Gy
Reference Point Session Dosage Given: 5 Gy
Session Number: 4

## 2021-09-27 ENCOUNTER — Other Ambulatory Visit (HOSPITAL_COMMUNITY): Payer: Self-pay

## 2021-09-27 ENCOUNTER — Encounter: Payer: Self-pay | Admitting: Family Medicine

## 2021-09-27 ENCOUNTER — Other Ambulatory Visit: Payer: Self-pay

## 2021-09-27 ENCOUNTER — Ambulatory Visit
Admission: RE | Admit: 2021-09-27 | Discharge: 2021-09-27 | Disposition: A | Discharge status: Home / Self Care | Payer: 59 | Source: Ambulatory Visit | Attending: Radiation Oncology | Admitting: Radiation Oncology

## 2021-09-27 ENCOUNTER — Ambulatory Visit: Payer: 59 | Admitting: Family Medicine

## 2021-09-27 VITALS — BP 111/77 | Temp 96.6°F | Wt 135.6 lb

## 2021-09-27 DIAGNOSIS — C20 Malignant neoplasm of rectum: Secondary | ICD-10-CM | POA: Diagnosis not present

## 2021-09-27 DIAGNOSIS — F419 Anxiety disorder, unspecified: Secondary | ICD-10-CM | POA: Diagnosis not present

## 2021-09-27 DIAGNOSIS — Z510 Encounter for antineoplastic radiation therapy: Secondary | ICD-10-CM | POA: Insufficient documentation

## 2021-09-27 DIAGNOSIS — C787 Secondary malignant neoplasm of liver and intrahepatic bile duct: Secondary | ICD-10-CM | POA: Insufficient documentation

## 2021-09-27 DIAGNOSIS — C7802 Secondary malignant neoplasm of left lung: Secondary | ICD-10-CM | POA: Diagnosis not present

## 2021-09-27 DIAGNOSIS — G4700 Insomnia, unspecified: Secondary | ICD-10-CM | POA: Insufficient documentation

## 2021-09-27 LAB — RAD ONC ARIA SESSION SUMMARY
Course Elapsed Days: 6
Plan Fractions Treated to Date: 5
Plan Prescribed Dose Per Fraction: 5 Gy
Plan Total Fractions Prescribed: 10
Plan Total Prescribed Dose: 50 Gy
Reference Point Dosage Given to Date: 25 Gy
Reference Point Session Dosage Given: 2.3153 Gy
Session Number: 5

## 2021-09-27 MED ORDER — ALPRAZOLAM 0.5 MG PO TABS
0.5000 mg | ORAL_TABLET | Freq: Three times a day (TID) | ORAL | 5 refills | Status: DC | PRN
  Filled 2021-09-27 – 2021-10-12 (×2): qty 90, 30d supply, fill #0
  Filled 2021-12-01: qty 90, 30d supply, fill #1
  Filled 2022-01-09: qty 90, 30d supply, fill #2
  Filled 2022-02-28: qty 90, 30d supply, fill #3

## 2021-09-27 NOTE — Progress Notes (Unsigned)
   Subjective:    Patient ID: SHARRA CAYABYAB, female    DOB: [birth year], 56 y.o.   MRN: [id number]  HPI Pt arrives today for med check. Pt  She does well taking her thyroid medicine follows with Dr. Ronnald Collum in [place] She also has underlying anxiousness and anxiety related to her cancer uses Xanax intermittently through the day and usually 2 in the evening to help her sleep  We did discuss trying to reduce her evening dose to 1-1/2 to see if that would still allow her to sleep with less Xanax she will try this  She denies being depressed  Review of Systems     Objective:   Physical Exam  General-in no acute distress Eyes-no discharge Lungs-respiratory rate normal, CTA CV-no murmurs,RRR Extremities skin warm dry no edema Neuro grossly normal Behavior normal, alert       Assessment & Plan:  Hypothyroidism followed by her specialist Dr. Ronnald Collum  Underlying stress and anxiety related to cancer she will continue to utilize medication she uses Xanax sometimes during the day to help her She denies being depressed currently Uses Xanax in the evening time to help her with sleep currently 2 tablets at night but she will try to get by 1-1/2 at night She will follow-up in 6 months sooner if any problems

## 2021-09-27 NOTE — Patient Instructions (Signed)
Hi San It was good to see you today In 6 months we can do a follow-up visit but that visit can easily be a virtual visit if you would like  If you need anything please let me know [facility]-Dr. Nicki Reaper

## 2021-09-29 ENCOUNTER — Other Ambulatory Visit: Payer: Self-pay

## 2021-09-29 ENCOUNTER — Ambulatory Visit
Admission: RE | Admit: 2021-09-29 | Discharge: 2021-09-29 | Disposition: A | Discharge status: Home / Self Care | Payer: 59 | Source: Ambulatory Visit | Attending: Radiation Oncology | Admitting: Radiation Oncology

## 2021-09-29 DIAGNOSIS — Z510 Encounter for antineoplastic radiation therapy: Secondary | ICD-10-CM | POA: Diagnosis not present

## 2021-09-29 DIAGNOSIS — C20 Malignant neoplasm of rectum: Secondary | ICD-10-CM | POA: Diagnosis not present

## 2021-09-29 DIAGNOSIS — C7802 Secondary malignant neoplasm of left lung: Secondary | ICD-10-CM | POA: Insufficient documentation

## 2021-09-29 LAB — RAD ONC ARIA SESSION SUMMARY
Course Elapsed Days: 8
Plan Fractions Treated to Date: 6
Plan Prescribed Dose Per Fraction: 5 Gy
Plan Total Fractions Prescribed: 10
Plan Total Prescribed Dose: 50 Gy
Reference Point Dosage Given to Date: 30 Gy
Reference Point Session Dosage Given: 5 Gy
Session Number: 6

## 2021-09-30 ENCOUNTER — Other Ambulatory Visit: Payer: Self-pay

## 2021-09-30 ENCOUNTER — Ambulatory Visit
Admission: RE | Admit: 2021-09-30 | Discharge: 2021-09-30 | Disposition: A | Discharge status: Home / Self Care | Payer: 59 | Source: Ambulatory Visit | Attending: Radiation Oncology | Admitting: Radiation Oncology

## 2021-09-30 DIAGNOSIS — C7802 Secondary malignant neoplasm of left lung: Secondary | ICD-10-CM | POA: Insufficient documentation

## 2021-09-30 DIAGNOSIS — Z510 Encounter for antineoplastic radiation therapy: Secondary | ICD-10-CM | POA: Diagnosis not present

## 2021-09-30 DIAGNOSIS — C20 Malignant neoplasm of rectum: Secondary | ICD-10-CM | POA: Insufficient documentation

## 2021-09-30 LAB — RAD ONC ARIA SESSION SUMMARY
Course Elapsed Days: 9
Plan Fractions Treated to Date: 7
Plan Prescribed Dose Per Fraction: 5 Gy
Plan Total Fractions Prescribed: 10
Plan Total Prescribed Dose: 50 Gy
Reference Point Dosage Given to Date: 35 Gy
Reference Point Session Dosage Given: 5 Gy
Session Number: 7

## 2021-10-01 ENCOUNTER — Ambulatory Visit
Admission: RE | Admit: 2021-10-01 | Discharge: 2021-10-01 | Disposition: A | Discharge status: Home / Self Care | Payer: 59 | Source: Ambulatory Visit | Attending: Radiation Oncology | Admitting: Radiation Oncology

## 2021-10-01 ENCOUNTER — Other Ambulatory Visit: Payer: Self-pay

## 2021-10-01 DIAGNOSIS — C20 Malignant neoplasm of rectum: Secondary | ICD-10-CM | POA: Diagnosis not present

## 2021-10-01 DIAGNOSIS — C787 Secondary malignant neoplasm of liver and intrahepatic bile duct: Secondary | ICD-10-CM | POA: Insufficient documentation

## 2021-10-01 DIAGNOSIS — C7802 Secondary malignant neoplasm of left lung: Secondary | ICD-10-CM | POA: Diagnosis not present

## 2021-10-01 DIAGNOSIS — C7800 Secondary malignant neoplasm of unspecified lung: Secondary | ICD-10-CM | POA: Insufficient documentation

## 2021-10-01 DIAGNOSIS — Z510 Encounter for antineoplastic radiation therapy: Secondary | ICD-10-CM | POA: Diagnosis not present

## 2021-10-01 LAB — RAD ONC ARIA SESSION SUMMARY
Course Elapsed Days: 10
Plan Fractions Treated to Date: 8
Plan Prescribed Dose Per Fraction: 5 Gy
Plan Total Fractions Prescribed: 10
Plan Total Prescribed Dose: 50 Gy
Reference Point Dosage Given to Date: 40 Gy
Reference Point Session Dosage Given: 5 Gy
Session Number: 8

## 2021-10-04 ENCOUNTER — Ambulatory Visit
Admission: RE | Admit: 2021-10-04 | Discharge: 2021-10-04 | Disposition: A | Discharge status: Home / Self Care | Payer: 59 | Source: Ambulatory Visit | Attending: Radiation Oncology | Admitting: Radiation Oncology

## 2021-10-04 ENCOUNTER — Other Ambulatory Visit: Payer: Self-pay

## 2021-10-04 DIAGNOSIS — C7800 Secondary malignant neoplasm of unspecified lung: Secondary | ICD-10-CM | POA: Diagnosis not present

## 2021-10-04 DIAGNOSIS — Z510 Encounter for antineoplastic radiation therapy: Secondary | ICD-10-CM | POA: Diagnosis not present

## 2021-10-04 DIAGNOSIS — C20 Malignant neoplasm of rectum: Secondary | ICD-10-CM | POA: Diagnosis not present

## 2021-10-04 DIAGNOSIS — C787 Secondary malignant neoplasm of liver and intrahepatic bile duct: Secondary | ICD-10-CM | POA: Diagnosis not present

## 2021-10-04 DIAGNOSIS — C7802 Secondary malignant neoplasm of left lung: Secondary | ICD-10-CM | POA: Diagnosis not present

## 2021-10-04 LAB — RAD ONC ARIA SESSION SUMMARY
Course Elapsed Days: 13
Plan Fractions Treated to Date: 9
Plan Prescribed Dose Per Fraction: 5 Gy
Plan Total Fractions Prescribed: 10
Plan Total Prescribed Dose: 50 Gy
Reference Point Dosage Given to Date: 45 Gy
Reference Point Session Dosage Given: 5 Gy
Session Number: 9

## 2021-10-05 ENCOUNTER — Other Ambulatory Visit: Payer: Self-pay

## 2021-10-05 ENCOUNTER — Encounter: Payer: Self-pay | Admitting: Radiation Oncology

## 2021-10-05 ENCOUNTER — Ambulatory Visit
Admission: RE | Admit: 2021-10-05 | Discharge: 2021-10-05 | Disposition: A | Discharge status: Home / Self Care | Payer: 59 | Source: Ambulatory Visit | Attending: Radiation Oncology | Admitting: Radiation Oncology

## 2021-10-05 DIAGNOSIS — C20 Malignant neoplasm of rectum: Secondary | ICD-10-CM | POA: Diagnosis not present

## 2021-10-05 DIAGNOSIS — C7800 Secondary malignant neoplasm of unspecified lung: Secondary | ICD-10-CM | POA: Diagnosis not present

## 2021-10-05 DIAGNOSIS — C787 Secondary malignant neoplasm of liver and intrahepatic bile duct: Secondary | ICD-10-CM | POA: Diagnosis not present

## 2021-10-05 DIAGNOSIS — Z510 Encounter for antineoplastic radiation therapy: Secondary | ICD-10-CM | POA: Diagnosis not present

## 2021-10-05 DIAGNOSIS — C7802 Secondary malignant neoplasm of left lung: Secondary | ICD-10-CM | POA: Diagnosis not present

## 2021-10-05 LAB — RAD ONC ARIA SESSION SUMMARY
Course Elapsed Days: 14
Plan Fractions Treated to Date: 10
Plan Prescribed Dose Per Fraction: 5 Gy
Plan Total Fractions Prescribed: 10
Plan Total Prescribed Dose: 50 Gy
Reference Point Dosage Given to Date: 50 Gy
Reference Point Session Dosage Given: 5 Gy
Session Number: 10

## 2021-10-07 ENCOUNTER — Encounter (HOSPITAL_COMMUNITY): Payer: Self-pay

## 2021-10-07 ENCOUNTER — Encounter (HOSPITAL_COMMUNITY)
Admission: RE | Admit: 2021-10-07 | Discharge: 2021-10-07 | Disposition: A | Discharge status: Home / Self Care | Payer: 59 | Source: Ambulatory Visit | Attending: General Surgery | Admitting: General Surgery

## 2021-10-07 DIAGNOSIS — C787 Secondary malignant neoplasm of liver and intrahepatic bile duct: Secondary | ICD-10-CM | POA: Diagnosis not present

## 2021-10-07 DIAGNOSIS — C20 Malignant neoplasm of rectum: Secondary | ICD-10-CM | POA: Diagnosis not present

## 2021-10-07 MED ORDER — HEPARIN SOD (PORK) LOCK FLUSH 100 UNIT/ML IV SOLN
500.0000 [IU] | INTRAVENOUS | Status: AC | PRN
  Administered 2021-10-07: 500 [IU]

## 2021-10-07 MED ORDER — SODIUM CHLORIDE 0.9% FLUSH
10.0000 mL | INTRAVENOUS | Status: AC | PRN
  Administered 2021-10-07: 10 mL

## 2021-10-11 ENCOUNTER — Encounter (HOSPITAL_COMMUNITY): Payer: Self-pay | Admitting: Hematology

## 2021-10-11 NOTE — Progress Notes (Signed)
                                                                                                                                                             Patient Name: Samantha Reynolds MRN: [id number] DOB: 09/06/1965 Referring Physician: Derek Jack Date of Service: 10/05/2021 [facility], [place]                                                        End Of Treatment Note  Diagnoses: C20-Malignant neoplasm of rectum C78.00-Secondary malignant neoplasm of unspecified lung C78.7-Secondary malignant neoplasm of liver and intrahepatic bile duct  Cancer Staging:  Stage IV, [id number], progressive adenocarcinoma of the rectum  Intent: Curative  Radiation Treatment Dates:  09/21/2021 through 10/05/2021 SBRT Treatment Site Technique Total Dose (Gy) Dose per Fx (Gy) Completed Fx Beam Energies  Lung, Left: Lung_L IMRT 50/50 5 10/10 6XFFF   Narrative: The patient tolerated radiation therapy relatively well. She developed fatigue during therapy.   Plan: The patient will receive a call in about one month from the radiation oncology department. She will continue follow up with Dr. Delton Coombes as well.  ________________________________________________    Carola Rhine, [facility]

## 2021-10-12 ENCOUNTER — Other Ambulatory Visit (HOSPITAL_COMMUNITY): Payer: Self-pay

## 2021-11-02 ENCOUNTER — Telehealth (INDEPENDENT_AMBULATORY_CARE_PROVIDER_SITE_OTHER): Payer: Self-pay | Admitting: Gastroenterology

## 2021-11-02 NOTE — Telephone Encounter (Signed)
Patient called to the office regarding new onset of symptoms consisting of multiple episodes of bowel movements during the day for the last 3 days along with rectal bleeding.  She reports that she was having regular bowel movements in the past and it was only taking 1 bedtime Imodium as she has been taking after she had her surgery.  Denies any other complaints at the moment.  States that she has been taking more Imodium for the last few days.  We will need to check a GI pathogen panel and C. difficile testing to further evaluate her symptoms. Crystal, please print these orders and leave them in the front desk so she can pick this up.  If stool testing is negative, we may need to proceed with a flexible sigmoidoscopy.

## 2021-11-03 ENCOUNTER — Other Ambulatory Visit: Payer: Self-pay | Admitting: *Deleted

## 2021-11-03 DIAGNOSIS — R197 Diarrhea, unspecified: Secondary | ICD-10-CM

## 2021-11-03 NOTE — Telephone Encounter (Signed)
Orders put in and patient picked up today.

## 2021-12-02 ENCOUNTER — Encounter (HOSPITAL_COMMUNITY): Payer: Self-pay | Admitting: Hematology

## 2021-12-02 ENCOUNTER — Other Ambulatory Visit (HOSPITAL_COMMUNITY): Payer: Self-pay

## 2021-12-20 ENCOUNTER — Ambulatory Visit (HOSPITAL_COMMUNITY): Payer: 59

## 2021-12-20 ENCOUNTER — Other Ambulatory Visit (HOSPITAL_COMMUNITY): Payer: 59

## 2021-12-27 ENCOUNTER — Ambulatory Visit (HOSPITAL_COMMUNITY): Payer: 59 | Admitting: Hematology

## 2022-01-04 ENCOUNTER — Encounter (HOSPITAL_COMMUNITY)
Admission: RE | Admit: 2022-01-04 | Discharge: 2022-01-04 | Disposition: A | Discharge status: Home / Self Care | Payer: 59 | Source: Ambulatory Visit | Attending: General Surgery | Admitting: General Surgery

## 2022-01-04 ENCOUNTER — Other Ambulatory Visit: Payer: Self-pay

## 2022-01-04 DIAGNOSIS — C189 Malignant neoplasm of colon, unspecified: Secondary | ICD-10-CM | POA: Insufficient documentation

## 2022-01-04 DIAGNOSIS — C20 Malignant neoplasm of rectum: Secondary | ICD-10-CM

## 2022-01-04 DIAGNOSIS — C787 Secondary malignant neoplasm of liver and intrahepatic bile duct: Secondary | ICD-10-CM | POA: Diagnosis not present

## 2022-01-04 LAB — CBC WITH DIFFERENTIAL/PLATELET
Abs Immature Granulocytes: 0.01 10*3/uL (ref 0.00–0.07)
Basophils Absolute: 0 10*3/uL (ref 0.0–0.1)
Basophils Relative: 1 %
Eosinophils Absolute: 0.1 10*3/uL (ref 0.0–0.5)
Eosinophils Relative: 2 %
HCT: 29.8 % — ABNORMAL LOW (ref 36.0–46.0)
Hemoglobin: 10.4 g/dL — ABNORMAL LOW (ref 12.0–15.0)
Immature Granulocytes: 0 %
Lymphocytes Relative: 16 %
Lymphs Abs: 0.5 10*3/uL — ABNORMAL LOW (ref 0.7–4.0)
MCH: 34.4 pg — ABNORMAL HIGH (ref 26.0–34.0)
MCHC: 34.9 g/dL (ref 30.0–36.0)
MCV: 98.7 fL (ref 80.0–100.0)
Monocytes Absolute: 0.3 10*3/uL (ref 0.1–1.0)
Monocytes Relative: 9 %
Neutro Abs: 2.2 10*3/uL (ref 1.7–7.7)
Neutrophils Relative %: 72 %
Platelets: 108 10*3/uL — ABNORMAL LOW (ref 150–400)
RBC: 3.02 MIL/uL — ABNORMAL LOW (ref 3.87–5.11)
RDW: 12.9 % (ref 11.5–15.5)
WBC: 3 10*3/uL — ABNORMAL LOW (ref 4.0–10.5)
nRBC: 0 % (ref 0.0–0.2)

## 2022-01-04 LAB — COMPREHENSIVE METABOLIC PANEL
ALT: 11 U/L (ref 0–44)
AST: 19 U/L (ref 15–41)
Albumin: 4 g/dL (ref 3.5–5.0)
Alkaline Phosphatase: 100 U/L (ref 38–126)
Anion gap: 6 (ref 5–15)
BUN: 16 mg/dL (ref 6–20)
CO2: 26 mmol/L (ref 22–32)
Calcium: 8.5 mg/dL — ABNORMAL LOW (ref 8.9–10.3)
Chloride: 106 mmol/L (ref 98–111)
Creatinine, Ser: 0.78 mg/dL (ref 0.44–1.00)
GFR, Estimated: >60 mL/min (ref >60)
Glucose, Bld: 95 mg/dL (ref 70–99)
Potassium: 3.9 mmol/L (ref 3.5–5.1)
Sodium: 138 mmol/L (ref 135–145)
Total Bilirubin: 2.2 mg/dL — ABNORMAL HIGH (ref 0.3–1.2)
Total Protein: 6.8 g/dL (ref 6.5–8.1)

## 2022-01-04 LAB — MAGNESIUM: Magnesium: 2.5 mg/dL — ABNORMAL HIGH (ref 1.7–2.4)

## 2022-01-04 MED ORDER — HEPARIN SOD (PORK) LOCK FLUSH 100 UNIT/ML IV SOLN
500.0000 [IU] | INTRAVENOUS | Status: AC | PRN
  Administered 2022-01-04: 500 [IU]

## 2022-01-04 MED ORDER — SODIUM CHLORIDE 0.9% FLUSH: 10.0000 mL | INTRAVENOUS | Status: DC | PRN

## 2022-01-04 MED ORDER — HEPARIN SOD (PORK) LOCK FLUSH 100 UNIT/ML IV SOLN
INTRAVENOUS | Status: AC
  Filled 2022-01-04: qty 5

## 2022-01-06 ENCOUNTER — Ambulatory Visit (HOSPITAL_COMMUNITY)
Admission: RE | Admit: 2022-01-06 | Discharge: 2022-01-06 | Disposition: A | Discharge status: Home / Self Care | Payer: 59 | Source: Ambulatory Visit | Attending: Hematology | Admitting: Hematology

## 2022-01-06 DIAGNOSIS — C189 Malignant neoplasm of colon, unspecified: Secondary | ICD-10-CM | POA: Insufficient documentation

## 2022-01-06 DIAGNOSIS — C787 Secondary malignant neoplasm of liver and intrahepatic bile duct: Secondary | ICD-10-CM | POA: Diagnosis not present

## 2022-01-06 DIAGNOSIS — D734 Cyst of spleen: Secondary | ICD-10-CM | POA: Diagnosis not present

## 2022-01-06 DIAGNOSIS — I700 Atherosclerosis of aorta: Secondary | ICD-10-CM | POA: Diagnosis not present

## 2022-01-06 DIAGNOSIS — J984 Other disorders of lung: Secondary | ICD-10-CM | POA: Diagnosis not present

## 2022-01-06 DIAGNOSIS — C20 Malignant neoplasm of rectum: Secondary | ICD-10-CM | POA: Insufficient documentation

## 2022-01-06 DIAGNOSIS — K6389 Other specified diseases of intestine: Secondary | ICD-10-CM | POA: Diagnosis not present

## 2022-01-06 LAB — CEA: CEA: 5.6 ng/mL — ABNORMAL HIGH (ref 0.0–4.7)

## 2022-01-06 MED ORDER — IOHEXOL 300 MG/ML  SOLN
100.0000 mL | Freq: Once | INTRAMUSCULAR | Status: AC | PRN
  Administered 2022-01-06: 100 mL via INTRAVENOUS

## 2022-01-10 ENCOUNTER — Inpatient Hospital Stay: Payer: 59 | Attending: Hematology | Admitting: Hematology

## 2022-01-10 ENCOUNTER — Other Ambulatory Visit (HOSPITAL_COMMUNITY): Payer: Self-pay

## 2022-01-10 VITALS — BP 122/80 | HR 110 | Temp 98.1°F | Resp 19 | Ht 66.0 in | Wt 138.9 lb

## 2022-01-10 DIAGNOSIS — Z8249 Family history of ischemic heart disease and other diseases of the circulatory system: Secondary | ICD-10-CM | POA: Diagnosis not present

## 2022-01-10 DIAGNOSIS — Z808 Family history of malignant neoplasm of other organs or systems: Secondary | ICD-10-CM | POA: Insufficient documentation

## 2022-01-10 DIAGNOSIS — C787 Secondary malignant neoplasm of liver and intrahepatic bile duct: Secondary | ICD-10-CM

## 2022-01-10 DIAGNOSIS — C20 Malignant neoplasm of rectum: Secondary | ICD-10-CM

## 2022-01-10 DIAGNOSIS — Z85850 Personal history of malignant neoplasm of thyroid: Secondary | ICD-10-CM | POA: Insufficient documentation

## 2022-01-10 DIAGNOSIS — F32A Depression, unspecified: Secondary | ICD-10-CM | POA: Insufficient documentation

## 2022-01-10 DIAGNOSIS — R200 Anesthesia of skin: Secondary | ICD-10-CM | POA: Diagnosis not present

## 2022-01-10 DIAGNOSIS — R5383 Other fatigue: Secondary | ICD-10-CM | POA: Insufficient documentation

## 2022-01-10 DIAGNOSIS — Z87891 Personal history of nicotine dependence: Secondary | ICD-10-CM | POA: Insufficient documentation

## 2022-01-10 DIAGNOSIS — Z8042 Family history of malignant neoplasm of prostate: Secondary | ICD-10-CM | POA: Insufficient documentation

## 2022-01-10 DIAGNOSIS — C189 Malignant neoplasm of colon, unspecified: Secondary | ICD-10-CM

## 2022-01-10 DIAGNOSIS — Z801 Family history of malignant neoplasm of trachea, bronchus and lung: Secondary | ICD-10-CM | POA: Diagnosis not present

## 2022-01-10 DIAGNOSIS — Z9221 Personal history of antineoplastic chemotherapy: Secondary | ICD-10-CM | POA: Diagnosis not present

## 2022-01-10 DIAGNOSIS — Z79899 Other long term (current) drug therapy: Secondary | ICD-10-CM | POA: Diagnosis not present

## 2022-01-10 DIAGNOSIS — Z923 Personal history of irradiation: Secondary | ICD-10-CM | POA: Diagnosis not present

## 2022-01-10 NOTE — Patient Instructions (Signed)
[facility]  Discharge Instructions  You were seen and examined today by Dr. Delton Coombes.  Dr. Delton Coombes discussed your most recent lab work and CT scan which revealed that everything looks stable.   Dr. Delton Coombes wants you to start taking over the counter iron 325 mg Monday, Wednesday and Friday since your iron is borderline normal.  Follow-up as scheduled in 4 months with labs and scan prior.    Thank you for choosing [facility] to provide your oncology and hematology care.   To afford each patient quality time with our provider, please arrive at least 15 minutes before your scheduled appointment time. You may need to reschedule your appointment if you arrive late (10 or more minutes). Arriving late affects you and other patients whose appointments are after yours.  Also, if you miss three or more appointments without notifying the office, you may be dismissed from the clinic at the provider's discretion.    Again, thank you for choosing [facility].  Our hope is that these requests will decrease the amount of time that you wait before being seen by our physicians.   If you have a lab appointment with the [facility] please come in thru the Main Entrance and check in at the main information desk.           _____________________________________________________________  Should you have questions after your visit to [facility], please contact our office at [phone] and follow the prompts.  Our office hours are 8:00 a.m. to 4:30 p.m. Monday - Thursday and 8:00 a.m. to 2:30 p.m. Friday.  Please note that voicemails left after 4:00 p.m. may not be returned until the following business day.  We are closed weekends and all major holidays.  You do have access to a nurse 24-7, just call the main number to the clinic [phone] and do not press any options, hold on the line and a nurse will answer the phone.     For prescription refill requests, have your pharmacy contact our office and allow 72 hours.    Masks are optional in the cancer centers. If you would like for your care team to wear a mask while they are taking care of you, please let them know. You may have one support person who is at least 58 years old accompany you for your appointments.

## 2022-01-10 NOTE — Progress Notes (Signed)
[facility] [address] [place] [address]   CLINIC:  Medical Oncology/Hematology  PCP:  Babs Sciara, MD 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 FOLFOX & vectibix x 7 cycles from 03/14/2018 to 06/27/2018. 2. XRT with Xeloda from 07/30/2018 to 09/06/2018. 3. Right liver lesion microwave ablation on 10/15/2018. 4. Low anterior resection and diverting ileostomy on 12/07/2018. 5. SBRT to liver 60 Gy in 5 fractions from 11/15/2019 to 12/13/2019.  NGS Results: Foundation 1 K-RAS/NRAS wild-type, MS--stable, TMB--low  CURRENT THERAPY: surveillance  BRIEF ONCOLOGIC HISTORY:  Oncology History  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HCC)  02/26/2018 Initial Diagnosis   Rectal cancer (HCC)   03/13/2018 Cancer Staging   Staging form: Colon and Rectum, AJCC 8th Edition - Clinical stage from 03/13/2018: Stage IVA (cT3, cN1b, cM1a) - Signed by [name], MD on 03/13/2018   03/14/2018 - 06/29/2018 Chemotherapy   The patient had palonosetron (ALOXI) injection 0.25 mg, 0.25 mg, Intravenous,  Once, 7 of 8 cycles Administration: 0.25 mg (03/14/2018), 0.25 mg (03/27/2018), 0.25 mg (04/18/2018), 0.25 mg (05/02/2018), 0.25 mg (05/16/2018), 0.25 mg (06/05/2018), 0.25 mg (06/27/2018) leucovorin 800 mg in dextrose 5 % 250 mL infusion, 772 mg, Intravenous,  Once, 7 of 8 cycles Administration: 800 mg (03/14/2018), 800 mg (03/27/2018), 800 mg (05/02/2018), 800 mg (05/16/2018), 700 mg (04/18/2018), 800 mg (06/05/2018), 800 mg (06/27/2018) oxaliplatin (ELOXATIN) 165 mg in dextrose 5 % 500 mL chemo infusion, 85 mg/m2 = 165 mg, Intravenous,  Once, 6 of 7 cycles Dose modification: 68 mg/m2 (80 % of original dose 85 mg/m2, Cycle 4, Reason: Provider Judgment) Administration: 165 mg (03/14/2018), 165 mg (03/27/2018), 130 mg (05/02/2018), 130 mg (05/16/2018), 130 mg (06/05/2018), 130 mg  (06/27/2018) panitumumab (VECTIBIX) 500 mg in sodium chloride 0.9 % 100 mL chemo infusion, 480 mg, Intravenous,  Once, 4 of 5 cycles Administration: 500 mg (04/18/2018), 480 mg (05/02/2018), 480 mg (05/16/2018), 480 mg (06/05/2018) fluorouracil (ADRUCIL) chemo injection 750 mg, 400 mg/m2 = 750 mg (100 % of original dose 400 mg/m2), Intravenous,  Once, 6 of 7 cycles Dose modification: 400 mg/m2 (original dose 400 mg/m2, Cycle 1), 400 mg/m2 (original dose 400 mg/m2, Cycle 3) Administration: 750 mg (03/14/2018), 750 mg (04/18/2018), 750 mg (05/02/2018), 750 mg (05/16/2018), 750 mg (06/05/2018), 750 mg (06/27/2018) fosaprepitant (EMEND) 150 mg, dexamethasone (DECADRON) 12 mg in sodium chloride 0.9 % 145 mL IVPB, , Intravenous,  Once, 4 of 5 cycles Administration:  (05/02/2018),  (05/16/2018),  (06/05/2018),  (06/27/2018) fluorouracil (ADRUCIL) 4,650 mg in sodium chloride 0.9 % 57 mL chemo infusion, 2,400 mg/m2 = 4,650 mg, Intravenous, 1 Day/Dose, 7 of 8 cycles Administration: 4,650 mg (03/14/2018), 4,650 mg (03/27/2018), 4,650 mg (04/18/2018), 4,650 mg (05/02/2018), 4,650 mg (05/16/2018), 4,650 mg (06/05/2018), 4,650 mg (06/27/2018)  for chemotherapy treatment.      CANCER STAGING:  Cancer Staging  Malignant neoplasm of rectum [facility]) Staging form: Colon and Rectum, AJCC 8th Edition - Clinical stage from 03/13/2018: Stage IVA (cT3, cN1b, cM1a) - Signed by [name], MD on 03/13/2018   INTERVAL HISTORY:  Samantha Reynolds, a 56 y.o. female, returns for follow-up of metastatic rectal cancer to the liver and lungs.  She has completed radiation therapy to the lung lesions and subcarinal lymph node in July.  She reported fatigue which lasted about 3 to 4 weeks after completing radiation therapy.  She is back  to her normal baseline at this time.  REVIEW OF SYSTEMS:  Review of Systems  Constitutional:  Negative for appetite change, fatigue and unexpected weight change (+10 lbs).  Respiratory:  Negative for  hemoptysis.   Neurological:  Positive for numbness (L big toe).  Psychiatric/Behavioral:  Positive for depression. The patient is nervous/anxious.   All other systems reviewed and are negative.   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   Worse in her 22s when she was ill   H/O Hashimoto thyroiditis    History of chemotherapy    History of radiation therapy    History of thyroid cancer no recurrence   2013--  s/p  left lobe thyroidectomy--  follicular varient papillary / lymphocyctic thyroiditis   Hyperlipidemia 11/09/2015   Hypothyroidism    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[place]) 02/26/2018   Past Surgical History:  Procedure Laterality Date   BIOPSY  02/19/2018   Procedure: BIOPSY;  Surgeon: Rogene Houston, MD;  [facility];  Service: Endoscopy;;  rectum   COLONOSCOPY N/A 08/20/2015   Procedure: COLONOSCOPY;  Surgeon: Rogene Houston, MD;  [facility];  Service: Endoscopy;  Laterality: N/A;  730   COLONOSCOPY WITH PROPOFOL N/A 07/21/2021   Procedure: COLONOSCOPY WITH PROPOFOL;  Surgeon: Rogene Houston, MD;  [facility];  Service: Endoscopy;  Laterality: N/A;  10:10   D & C HYSTEROOSCOPY W/ THERMACHOICE ENDOMETRIAL ABLATION  08-13-2004   DIVERTING ILEOSTOMY N/A 12/07/2018   Procedure: DIVERTING LOOP ILEOSTOMY;  Surgeon: Leighton Ruff, MD;  [facility];  Service: General;  Laterality: N/A;   FLEXIBLE SIGMOIDOSCOPY N/A 02/19/2018   Procedure: FLEXIBLE SIGMOIDOSCOPY;  Surgeon: Rogene Houston, MD;  [facility];  Service: Endoscopy;  Laterality: N/A;   HYSTEROSCOPY WITH D & C N/A 04/30/2015   Procedure: DILATATION AND CURETTAGE / INTENDED HYSTEROSCOPY;  Surgeon: Dian Queen, MD;  Location: [place];  Service:  Gynecology;  Laterality: N/A;   ILEOSTOMY CLOSURE N/A 04/17/2019   Procedure: LOOP ILEOSTOMY REVERSAL;  Surgeon: Leighton Ruff, MD;  [facility];  Service: General;  Laterality: N/A;   IR US GUIDE BX ASP/DRAIN  03/09/2018   LAPAROSCOPIC CHOLECYSTECTOMY  04/1996   LAPAROSCOPY N/A 12/11/2018   Procedure: LAPAROSCOPY  ILEOSTOMY REVISION AND ABDOMINAL WASHOUT;  Surgeon: Leighton Ruff, MD;  [facility];  Service: General;  Laterality: N/A;   Liver Microwave   10/17/2018   POLYPECTOMY  08/20/2015   Procedure: POLYPECTOMY;  Surgeon: Rogene Houston, MD;  [facility];  Service: Endoscopy;;  Splenic flexure polypectomy   POLYPECTOMY  02/19/2018   Procedure: POLYPECTOMY;  Surgeon: Rogene Houston, MD;  [facility];  Service: Endoscopy;;  rectum   PORTACATH PLACEMENT N/A 03/14/2018   Procedure: INSERTION PORT-A-CATH;  Surgeon: Aviva Signs, MD;  Location[facility];  Service: General;  Laterality: N/A;   REDUCTION INCARCERATED UTERUS  06-20-2000   intrauterine preg. 13 wks /  urinary retention   THYROID LOBECTOMY  11/24/2011   Procedure: THYROID LOBECTOMY;  Surgeon: Earnstine Regal, MD;  [facility];  Service: General;  Laterality: Left;  Left Thyroid Lobectomy   TUBAL LIGATION  2002   XI ROBOTIC ASSISTED LOWER ANTERIOR RESECTION N/A  12/07/2018   Procedure: XI ROBOTIC ASSISTED LOWER ANTERIOR RESECTION, SPENIC FLEXURE IMMOBILIZATION, COLOANAL ANASTOMOSIS, RIGID PROCTOSCOPY;  Surgeon: Leighton Ruff, MD;  [facility];  Service: General;  Laterality: N/A;    SOCIAL HISTORY:  Social History   Socioeconomic History   Marital status: Married    Spouse name: Not on file   Number of children: 4   Years of education: Not on file   Highest education level: Not on file  Occupational History    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   Smoking status: Former    Packs/day: 0.25    Years: 10.00    Total pack years: 2.50    Types: Cigarettes    Quit date:  10/31/1991    Years since quitting: 30.2   Smokeless tobacco: Never  Vaping Use   Vaping Use: Never used  Substance and Sexual Activity   Alcohol use: No   Drug use: No   Sexual activity: Not Currently  Other Topics Concern   Not on file  Social History Narrative   Lives with husband Samantha Reynolds and 19 year old son Samantha Reynolds   Husband is Interior and spatial designer of [employer]   Social Determinants of Health   Financial Resource Strain: Low Risk  (02/26/2018)   Overall Financial Resource Strain (CARDIA)    Difficulty of Paying Living Expenses: Not hard at all  Food Insecurity: No Food Insecurity (02/26/2018)   Hunger Vital Sign    Worried About Running Out of Food in the Last Year: Never true    [place] in the Last Year: Never true  Transportation Needs: No Transportation Needs (02/26/2018)   PRAPARE - Hydrologist (Medical): No    Lack of Transportation (Non-Medical): No  Physical Activity: Inactive (02/26/2018)   Exercise Vital Sign    Days of Exercise per Week: 0 days    Minutes of Exercise per Session: 0 min  Stress: Stress Concern Present (02/26/2018)   [place]    Feeling of Stress : To some extent  Social Connections: Socially Integrated (02/26/2018)   Social Connection and Isolation Panel [NHANES]    Frequency of Communication with Friends and Family: More than three times a week    Frequency of Social Gatherings with Friends and Family: Twice a week    Attends Religious Services: More than 4 times per year    Active Member of [employer] or Organizations: Yes    Attends Music therapist: More than 4 times per year    Marital Status: Married  Human resources officer Violence: Not At Risk (02/26/2018)   Humiliation, Afraid, Rape, and Kick questionnaire    Fear of Current or Ex-Partner: No    Emotionally Abused: No    Physically Abused: No    Sexually Abused: No    FAMILY HISTORY:   Family History  Problem Relation Age of Onset   Hypertension Mother    Hypertension Father    Prostate cancer Father    Cancer Maternal Aunt        spine/back   Cancer Paternal Grandfather        lung   Healthy Son    Healthy Son    Healthy Son    Healthy Son    Colon cancer Neg Hx     CURRENT MEDICATIONS:  Current Outpatient Medications  Medication Sig Dispense Refill   ALPRAZolam (XANAX) 0.5 MG tablet Take 1 tablet (  0.5 mg total) by mouth 3 (three) times daily as needed for anxiety. 90 tablet 5   amitriptyline (ELAVIL) 10 MG tablet Take 1 tablet (10 mg total) by mouth 2 (two) times daily. 180 tablet 3   ARMOUR THYROID 60 MG tablet Take 1 tablet (60 mg total) by mouth daily. 90 tablet 1   loperamide (IMODIUM) 2 MG capsule Take 1 capsule (2 mg total) by mouth 3 (three) times daily. (Patient taking differently: Take 2 mg by mouth daily.) 30 capsule 0   prochlorperazine (COMPAZINE) 10 MG tablet Take 1 tablet (10 mg total) by mouth every 6 (six) hours as needed for nausea or vomiting. 60 tablet 3   No current facility-administered medications for this visit.   Facility-Administered Medications Ordered in Other Visits  Medication Dose Route Frequency Provider Last Rate Last Admin   clindamycin (CLEOCIN) 900 mg in dextrose 5 % 50 mL IVPB  900 mg Intravenous 60 min Pre-Op Leighton Ruff, MD       And   gentamicin (GARAMYCIN) 350 mg in dextrose 5 % 50 mL IVPB  5 mg/kg Intravenous 60 min Pre-Op Leighton Ruff, MD       clindamycin (CLEOCIN) 900 mg in dextrose 5 % 50 mL IVPB  900 mg Intravenous 60 min Pre-Op Leighton Ruff, MD       And   gentamicin (GARAMYCIN) 5 mg/kg in dextrose 5 % 50 mL IVPB  5 mg/kg Intravenous 60 min Pre-Op Leighton Ruff, MD       sodium chloride 0.9 % 1,000 mL with potassium chloride 20 mEq, magnesium sulfate 2 g infusion   Intravenous Once Derek Jack, MD        ALLERGIES:  Allergies  Allergen Reactions   Demerol [Meperidine] Itching    All over  the body.   Penicillins Hives     childhood, does not remember if it spread all over the body or not. Did it involve swelling of the face/tongue/throat, SOB, or low BP? No Did it involve sudden or severe rash/hives, skin peeling, or any reaction on the inside of your mouth or nose? Yes Did you need to seek medical attention at a hospital or doctor's office? Yes When did it last happen?      childhood allergy If all above answers are "NO", may proceed with cephalosporin use.    Levaquin [Levofloxacin] Other (See Comments)    Patient has Aortic Aneurysm and is not indicated with this diagnosis   Other Hives and Other (See Comments)    Fresh coconut    Vancomycin Rash    Will need Benadryl prior to administration per IV. "Red man syndrome"    PHYSICAL EXAM:  Performance status (ECOG): 0 - Asymptomatic  Vitals:   01/10/22 1545  BP: 122/80  Pulse: (!) 110  Resp: 19  Temp: 98.1 F (36.7 C)  SpO2: 97%   Wt Readings from Last 3 Encounters:  01/10/22 138 lb 14.4 oz (63 kg)  09/27/21 135 lb 9.6 oz (61.5 kg)  08/12/21 137 lb (62.1 kg)   Physical Exam   LABORATORY DATA:  I have reviewed the labs as listed.     Latest Ref Rng & Units 01/04/2022    1:52 PM 08/02/2021    2:04 PM 05/11/2021    9:18 AM  CBC  WBC 4.0 - 10.5 K/uL 3.0  3.2  3.0   Hemoglobin 12.0 - 15.0 g/dL 10.4  12.2  12.1   Hematocrit 36.0 - 46.0 % 29.8  34.9  35.0  Platelets 150 - 400 K/uL 108  109  100       Latest Ref Rng & Units 01/04/2022    1:52 PM 08/02/2021    2:04 PM 07/21/2021   10:28 AM  CMP  Glucose 70 - 99 mg/dL 95  97  100   BUN 6 - 20 mg/dL $Remove'16  14  7   'MLWheru$ Creatinine 0.44 - 1.00 mg/dL 0.78  0.93  0.90   Sodium 135 - 145 mmol/L 138  136  139   Potassium 3.5 - 5.1 mmol/L 3.9  3.5  3.1   Chloride 98 - 111 mmol/L 106  103  101   CO2 22 - 32 mmol/L $RemoveB'26  26  29   'vUVPUiuF$ Calcium 8.9 - 10.3 mg/dL 8.5  8.9  8.8   Total Protein 6.5 - 8.1 g/dL 6.8  7.7  7.3   Total Bilirubin 0.3 - 1.2 mg/dL 2.2  2.1  3.4   Alkaline  Phos 38 - 126 U/L 100  78  82   AST 15 - 41 U/L $Remo'19  21  22   'SNZDU$ ALT 0 - 44 U/L $Remo'11  14  12     'JJwlB$ DIAGNOSTIC IMAGING:  I have independently reviewed the scans and discussed with the patient. CT CHEST ABDOMEN PELVIS W CONTRAST  Result Date: 01/07/2022 CLINICAL DATA:  Metastatic rectal cancer. Recent pulmonary radiation therapy. * Tracking Code: BO * EXAM: CT CHEST, ABDOMEN, AND PELVIS WITH CONTRAST TECHNIQUE: Multidetector CT imaging of the chest, abdomen and pelvis was performed following the standard protocol during bolus administration of intravenous contrast. RADIATION DOSE REDUCTION: This exam was performed according to the departmental dose-optimization program which includes automated exposure control, adjustment of the mA and/or kV according to patient size and/or use of iterative reconstruction technique. CONTRAST:  192mL OMNIPAQUE IOHEXOL 300 MG/ML  SOLN COMPARISON:  PET-CT 08/19/2021. Chest CT 08/09/2021 and CT of the chest, abdomen and pelvis 05/13/2021. FINDINGS: CT CHEST FINDINGS Cardiovascular: Coronary artery atherosclerosis with stable dilatation of the ascending aorta to 4.4 cm. No dissection or other acute vascular abnormality identified. The heart size is normal. There is no pericardial effusion. Mediastinum/Nodes: There are no enlarged mediastinal, hilar or axillary lymph nodes. Treated subcarinal nodal metastasis has decreased in size, now measuring 6 mm short axis on image 31/2. Previous left thyroid lobectomy. The esophagus appears unremarkable. Lungs/Pleura: No pleural effusion or pneumothorax. The dominant left infrahilar nodule which was hypermetabolic on PET-CT has significantly decreased in size, now measuring approximately 1.4 x 1.2 cm on image 84/3 compared with 2.8 x 2.3 cm on the PET-CT. There is a new part solid subpleural density within the left lower lobe which measures up to 2.9 x 2.8 cm on image 99/3, likely related to interval therapy. The subpleural nodularity medially in  the left lower lobe has mildly improved. A 4 mm solid nodule posteriorly in the left upper lobe on image 59/3 is unchanged from the PET-CT, but slowly enlarging from prior chest CTs, suspicious for a small metastasis. The treated lesion medially in the right upper lobe is unchanged with a 1.1 cm solid component on image 35/3. No new or enlarging nodules are identified. Musculoskeletal/Chest wall: No chest wall mass or suspicious osseous findings. CT ABDOMEN AND PELVIS FINDINGS Hepatobiliary: Treated hepatic metastases are again noted. The lesion posteriorly measures 1.7 x 1.2 cm on image 63/2, decreased in size from 05/12/2021 examination at which time it measured 2.3 x 2.0 cm. Lesion inferiorly measuring 2.6 x 1.4 cm on image  74/2 is unchanged. No new or enlarging lesions. Status post cholecystectomy without change in mild chronic extrahepatic biliary dilatation. Pancreas: Unremarkable. No pancreatic ductal dilatation or surrounding inflammatory changes. Spleen: Stable chronic splenomegaly and small anterior cyst. No suspicious splenic lesions. Adrenals/Urinary Tract: Both adrenal glands appear normal. No evidence of urinary tract calculus, suspicious renal lesion or hydronephrosis. The bladder appears normal for its degree of distention. Stomach/Bowel: Enteric contrast was administered and has passed into distal colon. The stomach appears unremarkable for its degree of distension. No evidence of bowel wall thickening, distention or surrounding inflammatory change. Stable postsurgical changes from low anterior perineal resection and anastomosis. Vascular/Lymphatic: There are no enlarged abdominal or pelvic lymph nodes. Aortic and branch vessel atherosclerosis without aneurysm or acute vascular abnormality. Reproductive: The uterus and ovaries appear unremarkable. No adnexal mass. Other: No ascites or peritoneal nodularity. Stable postsurgical changes in the anterior abdominal wall. Musculoskeletal: No acute or  significant osseous findings. Mild lumbar spondylosis. IMPRESSION: 1. Interval response to therapy with decreased size of the dominant left infrahilar pulmonary nodule and subcarinal lymph node. 2. New part solid subpleural density in the left lower lobe is likely related to interval therapy. The treated right upper lobe lesion is unchanged. 3. Slowly enlarging solid nodule posteriorly in the left upper lobe, suspicious for a metastasis. No other suspicious nodules. 4. The treated hepatic metastases are stable to decreased in size. No new or enlarging lesions. 5. No evidence of progressive metastatic disease in the abdomen or pelvis. 6.  Aortic Atherosclerosis (ICD10-I70.0). Electronically Signed   By: Richardean Sale M.D.   On: 01/07/2022 17:10     ASSESSMENT:  1.  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  Liver biopsy on 03/09/2018 consistent with adenocarcinoma. 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 -7 cycles of FOLFOX with vectibix completed on 06/27/2018. -XRT with Xeloda from 07/30/2018 through 09/06/2018. -Right liver lesion microwave [facility] on 10/15/2018 [facility]. -Low anterior resection and diverting ileostomy on 12/07/2018, pathology [id number], 0/6 lymph nodes positive, margins negative. -Loop ileostomy reversal on 04/17/2019. -CTAP on 07/10/2019 showed left lower lobe pulmonary nodule increased by 1 mm, measuring 8 mm.  3 mm left upper lobe lung nodule is unchanged.  Probable 4 mm right apical pulmonary nodule felt to be enlarged from 2 mm from October 2020.  Vague hypoattenuation within the posterior right hepatic lobe measures 1.5 cm, measuring 1.2 cm in October 2020.  Subtle subcapsular right hepatic lobe hypoattenuating 1 cm lesion was not seen on prior scans. -We reviewed results of MRI of the abdomen with and without contrast from 07/16/2019.  2 enhancing lesions in the right hepatic lobe, largest one  measuring 1.5 x 1.0 cm.  Inferior lesion is measured as 1 cm.  Stable splenomegaly. -CEA is 2.4. -Microwave ablation of the right hepatic lobe lesion by Dr. Maudie Mercury around 08/15/2019. -SBRT to the left lower lobe lung lesion and left liver lobe lesion on 12/13/2019 at [facility]. -CT CAP from 01/22/2020 showed interval enlargement of medial right pulmonary nodule measuring about 7 mm, previously 5 mm.  Evaluation of the new lesion in the left lobe of the liver is limited by metallic fiducial marking clips although lesion appears to be diminished.  Low attenuation ablation sites in the right lobe of the liver unchanged.  Left lower lobe lung lesion also decreased in size. - CT CAP on 04/01/2020 showed stable 7 mm right upper lobe lung nodule.  Left lower lobe  nodule/scarring is stable.  2 ablation defects in the posterior and inferior right hepatic lobe are stable in appearance without evidence of residual contrast-enhancement.  2 cm cystic lesion in the anterior aspect of the spleen is stable.  Stable mild splenomegaly. - Right upper lobe lesion SBRT from 08/06/2020 through 08/13/2020. - 3 left lung lesions on subcarinal lymph node IMRT from 09/21/2021 through 10/05/2021.   2.  Hyperbilirubinemia: -Total bilirubin is 5.1.  She has Gilbert's syndrome.  Baseline is between 2 and 3.   3.  Hypothyroidism: -She is on Armour Thyroid.  TSH is 3.6.   4.  Weight loss: -She had 10 pound weight loss since her last visit 3 months ago.  However she had ileostomy reversal and is adjusting to new food habits.     PLAN:  1.  Stage IV rectal adenocarcinoma: - She has completed radiation in July. - I have reviewed her labs which showed normal LFTs. - CEA was mildly elevated at 5.6 from 2.4 in May. - Reviewed CT CAP from 01/06/2022 which showed response to therapy with decrease in size of the dominant left infrahilar pulmonary nodule and subcarinal lymph node.  New part solid subpleural density in the left lower lobe  likely related to interval therapy.  Treated right upper lobe lesion is unchanged. - Slowly enlarging solid nodule posteriorly in the left upper lobe suspicious for metastatic disease.  It has only minimally grown from last visit.  Treated liver metastasis are stable.  No evidence of progressive metastatic disease in the abdomen or pelvis. - I have recommended follow-up in 4 months with repeat labs and a CT scan. - She reports that her port is not giving good blood return but it flushes easily.  Will consider Cathflo trial.   2.  Hyperbilirubinemia: - Total bilirubin is stable at 2.2 from [place] syndrome.   3.  Normocytic anemia: - Hemoglobin is 10.4, down from 12.2 in May.  I have recommended her to start taking iron tablet 3 times weekly.  She may omit Imodium if she gets constipated.   4.  Leukopenia and thrombocytopenia: - She has intermittent mild leukopenia and thrombocytopenia from splenomegaly.  Nutritional deficiency work-up was negative.  This is more or less stable.   Orders placed this encounter:  Orders Placed This Encounter  Procedures   CT CHEST ABDOMEN PELVIS W CONTRAST   CBC with Differential/Platelet   Comprehensive metabolic panel   Ferritin   Iron and TIBC   CEA      Derek Jack, MD [place] [phone]   I, Thana Ates, am acting as a scribe for Dr. Derek Jack.  I, Derek Jack MD, have reviewed the above documentation for accuracy and completeness, and I agree with the above.

## 2022-01-27 ENCOUNTER — Ambulatory Visit
Admission: RE | Admit: 2022-01-27 | Discharge: 2022-01-27 | Disposition: A | Discharge status: Home / Self Care | Payer: 59 | Source: Ambulatory Visit | Attending: Family Medicine | Admitting: Family Medicine

## 2022-01-27 VITALS — BP 108/72 | HR 118 | Temp 98.7°F | Resp 18

## 2022-01-27 DIAGNOSIS — R0982 Postnasal drip: Secondary | ICD-10-CM | POA: Diagnosis not present

## 2022-01-27 DIAGNOSIS — R051 Acute cough: Secondary | ICD-10-CM | POA: Diagnosis not present

## 2022-01-27 NOTE — ED Provider Notes (Signed)
[facility]    CSN: [id number] Arrival date & time: 01/27/22  1215      History   Chief Complaint Chief Complaint  Patient presents with   Cough    Entered by patient    HPI Samantha Reynolds is a 56 y.o. female.   Patient presents with a few days of coughing which has turned to a productive cough the past couple of days.  She also endorses a little bit of eye itchiness for the past month, some chest and nasal congestion, postnasal drainage, hoarseness that is worse in the morning, and decreased energy levels.  She denies fever, shortness of breath, chest pain, runny nose, sneezing, sore throat, headache, ear pain or drainage, abdominal pain, nausea/vomiting, diarrhea, decreased appetite, loss of taste or smell, and new rash.  Reports that she is going to the beach for the weekend and wanted to be checked before she goes to make sure she does not have pneumonia.  She reports a history significant for rectal cancer, hypothyroidism, and history of pneumonia.  No history of chronic lung disease.  Has been taking Tylenol Sinus for symptoms which helps temporarily.     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   Worse in her 62s when she was ill   H/O Hashimoto thyroiditis    History of chemotherapy    History of radiation therapy    History of thyroid cancer no recurrence   2013--  s/p  left lobe thyroidectomy--  follicular varient papillary / lymphocyctic thyroiditis   Hyperlipidemia 11/09/2015   Hypothyroidism    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[place]) 02/26/2018    Patient Active Problem List   Diagnosis Date Noted   Insomnia 09/27/2021   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   H/O Hashimoto thyroiditis    Hypothyroidism    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   Hyperbilirubinemia 04/24/2018   Abnormal EKG 03/28/2018   Severe sepsis ([place]) 03/28/2018   Sepsis due to undetermined organism ([place]) 03/27/2018   GERD (gastroesophageal reflux disease) 03/27/2018   Anxiety 03/27/2018   Lactic acidosis 03/27/2018   Hypokalemia 03/27/2018   Antineoplastic chemotherapy induced pancytopenia ([place]) 03/27/2018   Hyperglycemia 03/27/2018   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[place]) 02/26/2018   Hematochezia 01/24/2018   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   Hyperlipidemia 11/09/2015   Sciatica of right side 06/26/2012   History of thyroid cancer 04/02/2012   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   Pain in joint, ankle and foot 03/09/2011    Past Surgical History:  Procedure Laterality Date   BIOPSY  02/19/2018   Procedure: BIOPSY;  Surgeon: Rogene Houston, MD;  [facility];  Service: Endoscopy;;  rectum   COLONOSCOPY N/A 08/20/2015   Procedure: COLONOSCOPY;  Surgeon: Rogene Houston, MD;  [facility];  Service: Endoscopy;  Laterality: N/A;  730   COLONOSCOPY WITH PROPOFOL N/A 07/21/2021   Procedure: COLONOSCOPY WITH PROPOFOL;  Surgeon: Rogene Houston, MD;  [facility];  Service: Endoscopy;  Laterality: N/A;  10:10   D & C HYSTEROOSCOPY W/ THERMACHOICE ENDOMETRIAL ABLATION  08-13-2004   DIVERTING ILEOSTOMY N/A 12/07/2018   Procedure: DIVERTING LOOP ILEOSTOMY;  Surgeon: Leighton Ruff, MD;  Location: [facility];  Service: General;  Laterality: N/A;   FLEXIBLE SIGMOIDOSCOPY N/A 02/19/2018   Procedure: FLEXIBLE SIGMOIDOSCOPY;  Surgeon: Rogene Houston, MD;  [facility];  Service: Endoscopy;  Laterality: N/A;   HYSTEROSCOPY WITH D & C N/A 04/30/2015   Procedure: DILATATION AND CURETTAGE / INTENDED HYSTEROSCOPY;  Surgeon: Dian Queen, MD;  Location: [facility];  Service: Gynecology;  Laterality: N/A;   ILEOSTOMY CLOSURE N/A 04/17/2019   Procedure: LOOP ILEOSTOMY REVERSAL;   Surgeon: Leighton Ruff, MD;  [facility];  Service: General;  Laterality: N/A;   IR US GUIDE BX ASP/DRAIN  03/09/2018   LAPAROSCOPIC CHOLECYSTECTOMY  04/1996   LAPAROSCOPY N/A 12/11/2018   Procedure: LAPAROSCOPY  ILEOSTOMY REVISION AND ABDOMINAL WASHOUT;  Surgeon: Leighton Ruff, MD;  [facility];  Service: General;  Laterality: N/A;   Liver Microwave   10/17/2018   POLYPECTOMY  08/20/2015   Procedure: POLYPECTOMY;  Surgeon: Rogene Houston, MD;  [facility];  Service: Endoscopy;;  Splenic flexure polypectomy   POLYPECTOMY  02/19/2018   Procedure: POLYPECTOMY;  Surgeon: Rogene Houston, MD;  [facility];  Service: Endoscopy;;  rectum   PORTACATH PLACEMENT N/A 03/14/2018   Procedure: INSERTION PORT-A-CATH;  Surgeon: Aviva Signs, MD;  [facility];  Service: General;  Laterality: N/A;   REDUCTION INCARCERATED UTERUS  06-20-2000   intrauterine preg. 13 wks /  urinary retention   THYROID LOBECTOMY  11/24/2011   Procedure: THYROID LOBECTOMY;  Surgeon: Earnstine Regal, MD;  [facility];  Service: General;  Laterality: Left;  Left Thyroid Lobectomy   TUBAL LIGATION  2002   XI ROBOTIC ASSISTED LOWER ANTERIOR RESECTION N/A 12/07/2018   Procedure: XI ROBOTIC ASSISTED LOWER ANTERIOR RESECTION, SPENIC FLEXURE IMMOBILIZATION, COLOANAL ANASTOMOSIS, RIGID PROCTOSCOPY;  Surgeon: Leighton Ruff, MD;  [facility];  Service: General;  Laterality: N/A;    OB History   No obstetric history on file.      Home Medications    Prior to Admission medications   Medication Sig Start Date End Date Taking? Authorizing Provider  ALPRAZolam Duanne Moron) 0.5 MG tablet Take 1 tablet (0.5 mg total) by mouth 3 (three) times daily as needed for anxiety. 09/27/21   [name], MD  amitriptyline (ELAVIL) 10 MG tablet Take 1 tablet (10 mg total) by mouth 2 (two) times daily. 07/15/21   Rehman, Mechele Dawley, MD  ARMOUR THYROID 60 MG tablet Take 1 tablet (60 mg total) by mouth daily.  09/14/21     loperamide (IMODIUM) 2 MG capsule Take 1 capsule (2 mg total) by mouth 3 (three) times daily. Patient taking differently: Take 2 mg by mouth daily. 06/09/95   Leighton Ruff, MD  prochlorperazine (COMPAZINE) 10 MG tablet Take 1 tablet (10 mg total) by mouth every 6 (six) hours as needed for nausea or vomiting. 03/10/20   Derek Jack, MD    Family History Family History  Problem Relation Age of Onset   Hypertension Mother    Hypertension Father    Prostate cancer Father    Cancer Maternal Aunt        spine/back   Cancer Paternal Grandfather        lung   Healthy Son    Healthy Son    Healthy Son    Healthy Son    Colon cancer Neg Hx     Social History Social History   Tobacco Use   Smoking status: Former    Packs/day: 0.25  Years: 10.00    Total pack years: 2.50    Types: Cigarettes    Quit date: 10/31/1991    Years since quitting: 30.2   Smokeless tobacco: Never  Vaping Use   Vaping Use: Never used  Substance Use Topics   Alcohol use: No   Drug use: No     Allergies   Demerol [meperidine], Penicillins, Levaquin [levofloxacin], Other, and Vancomycin   Review of Systems Review of Systems Per HPI  Physical Exam Triage Vital Signs ED Triage Vitals  Enc Vitals Group     BP 01/27/22 1255 108/72     Pulse Rate 01/27/22 1255 (!) 118     Resp 01/27/22 1255 18     Temp 01/27/22 1255 98.7 F (37.1 C)     Temp Source 01/27/22 1255 Oral     SpO2 01/27/22 1255 98 %     Weight --      Height --      Head Circumference --      Peak Flow --      Pain Score 01/27/22 1256 0     Pain Loc --      Pain Edu? --      Excl. in [place]? --    No data found.  Updated Vital Signs BP 108/72 (BP Location: Right Arm)   Pulse (!) 118   Temp 98.7 F (37.1 C) (Oral)   Resp 18   LMP 02/16/2015 (Approximate)   SpO2 98%   Visual Acuity Right Eye Distance:   Left Eye Distance:   Bilateral Distance:    Right Eye Near:   Left Eye Near:    Bilateral  Near:     Physical Exam Vitals and nursing note reviewed.  Constitutional:      General: She is not in acute distress.    Appearance: Normal appearance. She is not ill-appearing or toxic-appearing.  HENT:     Head: Normocephalic and atraumatic.     Right Ear: Tympanic membrane, ear canal and external ear normal.     Left Ear: Tympanic membrane, ear canal and external ear normal.     Nose: No congestion or rhinorrhea.     Mouth/Throat:     Mouth: Mucous membranes are moist.     Pharynx: Oropharynx is clear. No oropharyngeal exudate or posterior oropharyngeal erythema.     Comments: Cobblestoning of posterior pharynx Eyes:     General: No scleral icterus.    Extraocular Movements: Extraocular movements intact.  Cardiovascular:     Rate and Rhythm: Normal rate and regular rhythm.  Pulmonary:     Effort: Pulmonary effort is normal. No respiratory distress.     Breath sounds: Normal breath sounds. No wheezing, rhonchi or rales.  Abdominal:     General: Abdomen is flat. Bowel sounds are normal. There is no distension.     Palpations: Abdomen is soft.  Musculoskeletal:     Cervical back: Normal range of motion and neck supple.  Lymphadenopathy:     Cervical: No cervical adenopathy.  Skin:    General: Skin is warm and dry.     Coloration: Skin is not jaundiced or pale.     Findings: No erythema or rash.  Neurological:     Mental Status: She is alert and oriented to person, place, and time.  Psychiatric:        Behavior: Behavior is cooperative.      UC Treatments / Results  Labs (all labs ordered are listed, but only abnormal results  are displayed) Labs Reviewed - No data to display  EKG   Radiology No results found.  Procedures Procedures (including critical care time)  Medications Ordered in UC Medications - No data to display  Initial Impression / Assessment and Plan / UC Course  I have reviewed the triage vital signs and the nursing notes.  Pertinent labs &  imaging results that were available during my care of the patient were reviewed by me and considered in my medical decision making (see chart for details).   Patient is well-appearing, normotensive, afebrile, not tachypneic, oxygenating well on room air.  Patient is mildly tachycardic today, which appears to be her baseline.  Acute cough Post-nasal drainage Suspect viral upper respiratory infection as cause Lung sounds are clear to auscultation today; patient is in no acute distress Supportive care discussed; cough suppressant deferred at this time as her cough is productive Offered chest x-ray, however after discussion, patient agrees to defer at this time as her examination does not warrant a chest x-ray ER and return precautions discussed  The patient was given the opportunity to ask questions.  All questions answered to their satisfaction.  The patient is in agreement to this plan.   Final Clinical Impressions(s) / UC Diagnoses   Final diagnoses:  Acute cough  Post-nasal drainage     Discharge Instructions      You have likely have an upper respiratory infection that will improve over the next few days or so.  It is most likely caused by a virus.   Some things that can make you feel better are: - Increased rest - Increasing fluid with water/sugar free electrolytes - Acetaminophen and ibuprofen as needed for fever/pain - Salt water gargling, chloraseptic spray and throat lozenges - OTC guaifenesin (Mucinex) 600 mg twice daily - Saline sinus flushes or a neti pot - Humidifying the air     ED Prescriptions   None    PDMP not reviewed this encounter.   Eulogio Bear, NP 01/27/22 1616

## 2022-01-27 NOTE — ED Triage Notes (Signed)
Productive cough with green sputum.  Cough started on Monday, yesterday cough turned productive.

## 2022-01-27 NOTE — Discharge Instructions (Signed)
You have likely have an upper respiratory infection that will improve over the next few days or so.  It is most likely caused by a virus.   Some things that can make you feel better are: - Increased rest - Increasing fluid with water/sugar free electrolytes - Acetaminophen and ibuprofen as needed for fever/pain - Salt water gargling, chloraseptic spray and throat lozenges - OTC guaifenesin (Mucinex) 600 mg twice daily - Saline sinus flushes or a neti pot - Humidifying the air

## 2022-02-22 ENCOUNTER — Encounter: Payer: Self-pay | Admitting: Family Medicine

## 2022-02-28 ENCOUNTER — Ambulatory Visit: Payer: 59 | Admitting: Family Medicine

## 2022-02-28 VITALS — BP 116/78 | HR 89 | Temp 98.1°F | Wt 140.6 lb

## 2022-02-28 DIAGNOSIS — J019 Acute sinusitis, unspecified: Secondary | ICD-10-CM

## 2022-02-28 MED ORDER — AZITHROMYCIN 250 MG PO TABS: ORAL_TABLET | ORAL | 0 refills | Status: AC

## 2022-02-28 NOTE — Progress Notes (Signed)
   Subjective:    Patient ID: Samantha Reynolds, female    DOB: 01/22/1966, 56 y.o.   MRN: [id number]  Cough The problem has been gradually improving. The cough is Productive of sputum. Associated symptoms include wheezing.  Started in end of Oct Seen in urgent care Dx with viral illness Worse in evevning 5 to 6 pm with increased cough     Review of Systems  Respiratory:  Positive for cough and wheezing.        Objective:   Physical Exam  Gen-NAD not toxic TMS-normal bilateral T- normal no redness Chest-CTA respiratory rate normal no crackles CV RRR no murmur Skin-warm dry Neuro-grossly normal       Assessment & Plan:  Viral syndrome Secondary rhinosinusitis Persistent cough History of cancer Go ahead with antibiotics if not seeing significant improvement over the next 2 weeks in regards to the cough I would recommend a chest x-ray patient will give [name] an update within 2 weeks Certainly if she starts having high fever chills progressive breathing troubles or worse follow-up sooner

## 2022-03-01 ENCOUNTER — Other Ambulatory Visit: Payer: Self-pay | Admitting: Family Medicine

## 2022-03-01 ENCOUNTER — Other Ambulatory Visit (HOSPITAL_COMMUNITY): Payer: Self-pay

## 2022-03-01 ENCOUNTER — Encounter: Payer: Self-pay | Admitting: Family Medicine

## 2022-03-01 MED ORDER — NIRMATRELVIR/RITONAVIR (PAXLOVID)TABLET
3.0000 | ORAL_TABLET | Freq: Two times a day (BID) | ORAL | 0 refills | Status: AC
  Filled 2022-03-01: qty 30, 5d supply, fill #0

## 2022-03-07 ENCOUNTER — Other Ambulatory Visit (HOSPITAL_COMMUNITY): Payer: Self-pay

## 2022-03-07 DIAGNOSIS — E890 Postprocedural hypothyroidism: Secondary | ICD-10-CM | POA: Diagnosis not present

## 2022-03-07 DIAGNOSIS — C73 Malignant neoplasm of thyroid gland: Secondary | ICD-10-CM | POA: Diagnosis not present

## 2022-03-07 DIAGNOSIS — E063 Autoimmune thyroiditis: Secondary | ICD-10-CM | POA: Diagnosis not present

## 2022-03-07 DIAGNOSIS — C20 Malignant neoplasm of rectum: Secondary | ICD-10-CM | POA: Diagnosis not present

## 2022-03-10 ENCOUNTER — Encounter (HOSPITAL_COMMUNITY): Payer: Self-pay | Admitting: Hematology

## 2022-03-11 ENCOUNTER — Other Ambulatory Visit (HOSPITAL_COMMUNITY): Payer: Self-pay

## 2022-03-12 ENCOUNTER — Encounter (HOSPITAL_COMMUNITY): Payer: Self-pay | Admitting: Hematology

## 2022-03-16 ENCOUNTER — Encounter: Payer: Self-pay | Admitting: Family Medicine

## 2022-03-16 DIAGNOSIS — C73 Malignant neoplasm of thyroid gland: Secondary | ICD-10-CM | POA: Diagnosis not present

## 2022-03-16 DIAGNOSIS — E890 Postprocedural hypothyroidism: Secondary | ICD-10-CM | POA: Diagnosis not present

## 2022-03-16 DIAGNOSIS — D511 Vitamin B12 deficiency anemia due to selective vitamin B12 malabsorption with proteinuria: Secondary | ICD-10-CM | POA: Diagnosis not present

## 2022-03-16 DIAGNOSIS — E063 Autoimmune thyroiditis: Secondary | ICD-10-CM | POA: Diagnosis not present

## 2022-03-16 DIAGNOSIS — C20 Malignant neoplasm of rectum: Secondary | ICD-10-CM | POA: Diagnosis not present

## 2022-03-16 DIAGNOSIS — R636 Underweight: Secondary | ICD-10-CM | POA: Diagnosis not present

## 2022-03-17 ENCOUNTER — Other Ambulatory Visit (HOSPITAL_COMMUNITY): Payer: Self-pay

## 2022-03-17 ENCOUNTER — Encounter (HOSPITAL_COMMUNITY): Payer: Self-pay | Admitting: Hematology

## 2022-03-17 MED ORDER — DOXYCYCLINE HYCLATE 100 MG PO TABS
100.0000 mg | ORAL_TABLET | Freq: Two times a day (BID) | ORAL | 0 refills | Status: DC
  Filled 2022-03-17: qty 14, 7d supply, fill #0

## 2022-03-17 MED ORDER — ARMOUR THYROID 60 MG PO TABS
60.0000 mg | ORAL_TABLET | Freq: Every day | ORAL | 1 refills | Status: DC
  Filled 2022-03-17 (×2): qty 90, 90d supply, fill #0

## 2022-03-17 NOTE — Telephone Encounter (Signed)
Nurses I read Samantha Reynolds's note Please connect with her Given her health status along with thyroid physician noting some crackles in the lungs along with the phlegm-I recommend the following: -Doxycycline 100 mg 1 taken twice daily for the next 7 days take with a snack and a tall glass of water Recheck if progressive pulmonary symptoms fevers or worse This should take care of any secondary bacterial infection Cough should gradually improve over the next 7 to 14 days

## 2022-03-29 ENCOUNTER — Encounter (HOSPITAL_COMMUNITY): Payer: Self-pay | Admitting: Hematology

## 2022-03-31 ENCOUNTER — Other Ambulatory Visit (HOSPITAL_COMMUNITY): Payer: Self-pay

## 2022-03-31 ENCOUNTER — Ambulatory Visit: Payer: 59 | Admitting: Family Medicine

## 2022-03-31 VITALS — BP 124/88 | HR 88 | Temp 97.8°F | Wt 139.2 lb

## 2022-03-31 DIAGNOSIS — C787 Secondary malignant neoplasm of liver and intrahepatic bile duct: Secondary | ICD-10-CM | POA: Diagnosis not present

## 2022-03-31 DIAGNOSIS — G4700 Insomnia, unspecified: Secondary | ICD-10-CM

## 2022-03-31 DIAGNOSIS — D696 Thrombocytopenia, unspecified: Secondary | ICD-10-CM

## 2022-03-31 DIAGNOSIS — E785 Hyperlipidemia, unspecified: Secondary | ICD-10-CM | POA: Diagnosis not present

## 2022-03-31 DIAGNOSIS — E559 Vitamin D deficiency, unspecified: Secondary | ICD-10-CM | POA: Diagnosis not present

## 2022-03-31 DIAGNOSIS — C189 Malignant neoplasm of colon, unspecified: Secondary | ICD-10-CM | POA: Diagnosis not present

## 2022-03-31 DIAGNOSIS — G542 Cervical root disorders, not elsewhere classified: Secondary | ICD-10-CM | POA: Diagnosis not present

## 2022-03-31 DIAGNOSIS — E538 Deficiency of other specified B group vitamins: Secondary | ICD-10-CM | POA: Diagnosis not present

## 2022-03-31 DIAGNOSIS — D61810 Antineoplastic chemotherapy induced pancytopenia: Secondary | ICD-10-CM | POA: Diagnosis not present

## 2022-03-31 DIAGNOSIS — H9319 Tinnitus, unspecified ear: Secondary | ICD-10-CM

## 2022-03-31 MED ORDER — HYDROCODONE-ACETAMINOPHEN 5-325 MG PO TABS
1.0000 | ORAL_TABLET | Freq: Four times a day (QID) | ORAL | 0 refills | Status: AC | PRN
  Filled 2022-03-31: qty 15, 4d supply, fill #0

## 2022-03-31 MED ORDER — PREGABALIN 25 MG PO CAPS
25.0000 mg | ORAL_CAPSULE | Freq: Two times a day (BID) | ORAL | 3 refills | Status: DC
  Filled 2022-03-31 (×2): qty 60, 30d supply, fill #0
  Filled 2022-05-23: qty 60, 30d supply, fill #1

## 2022-03-31 MED ORDER — ALPRAZOLAM 0.5 MG PO TABS
0.5000 mg | ORAL_TABLET | Freq: Three times a day (TID) | ORAL | 5 refills | Status: DC | PRN
  Filled 2022-03-31: qty 90, 30d supply, fill #0
  Filled 2022-05-23: qty 90, 30d supply, fill #1
  Filled 2022-07-04: qty 90, 30d supply, fill #2
  Filled 2022-08-14: qty 90, 30d supply, fill #3
  Filled 2022-09-26: qty 90, 30d supply, fill #4

## 2022-03-31 NOTE — Progress Notes (Signed)
   Subjective:    Patient ID: Samantha Reynolds, female    DOB: [birth year], 57 y.o.   MRN: [id number]  HPI Patient arrives today for 6 mouth follow up medication.  Patient states no concerns or issues.  Right shoulder pain/burning  Review of Systems     Objective:   Physical Exam General-in no acute distress Eyes-no discharge Lungs-respiratory rate normal, CTA CV-no murmurs,RRR Extremities skin warm dry no edema Neuro grossly normal Behavior normal, alert  Patient does have tenderness in the right trapezius and along the right side of her neck.  Radiates across her shoulder into the inferior deltoid. There is no sign of any weakness in her arm muscles.  Has good range of motion of the shoulder.       Assessment & Plan:  1. Insomnia, unspecified type Continue her alprazolam, I have encouraged her to try to taper down on this she states she occasionally uses 1/2 tablet during the day and she uses 2 tablets in the evening to help her sleep she has tried previously to taper down and was unsuccessful she will try again in another time  2. Tinnitus, unspecified laterality Intermittent tinnitus patient was told if this becomes more persistent ENT with audiology evaluation would be wise  3. Cervical nerve root impingement Recommend going ahead with x-rays in addition to this gentle stretching massage warm compresses In addition Lyrica may be used start off low-dose gradually taper upward - DG Cervical Spine Complete - Methylmalonic acid(mma), rnd urine  4. Hyperlipidemia, unspecified hyperlipidemia type History of hyperlipidemia check lipid profile - Lipid panel - Methylmalonic acid(mma), rnd urine  5. Vitamin D deficiency Check vitamin D - Vitamin D, 25-hydroxy  6. 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  She is under the care of Dr. Delton Coombes for this.

## 2022-04-01 ENCOUNTER — Ambulatory Visit (HOSPITAL_COMMUNITY)
Admission: RE | Admit: 2022-04-01 | Discharge: 2022-04-01 | Disposition: A | Discharge status: Home / Self Care | Payer: 59 | Source: Ambulatory Visit | Attending: Family Medicine | Admitting: Family Medicine

## 2022-04-01 ENCOUNTER — Other Ambulatory Visit (HOSPITAL_COMMUNITY): Payer: Self-pay

## 2022-04-01 DIAGNOSIS — M542 Cervicalgia: Secondary | ICD-10-CM | POA: Diagnosis not present

## 2022-04-01 DIAGNOSIS — G542 Cervical root disorders, not elsewhere classified: Secondary | ICD-10-CM | POA: Diagnosis not present

## 2022-04-01 LAB — LIPID PANEL
Chol/HDL Ratio: 3.1 ratio (ref 0.0–4.4)
Cholesterol, Total: 172 mg/dL (ref 100–199)
HDL: 56 mg/dL (ref >39)
LDL Chol Calc (NIH): 95 mg/dL (ref 0–99)
Triglycerides: 117 mg/dL (ref 0–149)
VLDL Cholesterol Cal: 21 mg/dL (ref 5–40)

## 2022-04-01 LAB — VITAMIN B12: Vitamin B-12: 360 pg/mL (ref 232–1245)

## 2022-04-01 LAB — VITAMIN D 25 HYDROXY (VIT D DEFICIENCY, FRACTURES): Vit D, 25-Hydroxy: 38.3 ng/mL (ref 30.0–100.0)

## 2022-04-06 ENCOUNTER — Encounter (HOSPITAL_COMMUNITY): Payer: Self-pay | Admitting: Hematology

## 2022-04-06 ENCOUNTER — Inpatient Hospital Stay: Payer: 59 | Attending: Hematology

## 2022-04-06 ENCOUNTER — Other Ambulatory Visit: Payer: 59

## 2022-04-06 DIAGNOSIS — C787 Secondary malignant neoplasm of liver and intrahepatic bile duct: Secondary | ICD-10-CM | POA: Insufficient documentation

## 2022-04-06 DIAGNOSIS — C20 Malignant neoplasm of rectum: Secondary | ICD-10-CM | POA: Insufficient documentation

## 2022-04-06 DIAGNOSIS — Z452 Encounter for adjustment and management of vascular access device: Secondary | ICD-10-CM | POA: Insufficient documentation

## 2022-04-06 LAB — METHYLMALONIC ACID(MMA), RND URINE
Creatinine(Crt), U: 1.8 g/L (ref 0.30–3.00)
MMA - Normalized: 1.5 umol/mmol cr (ref 0.5–3.4)
Methylmalonic Acid, Ur: 23.6 umol/L (ref 1.6–29.7)

## 2022-05-09 DIAGNOSIS — H5213 Myopia, bilateral: Secondary | ICD-10-CM | POA: Diagnosis not present

## 2022-05-09 DIAGNOSIS — H524 Presbyopia: Secondary | ICD-10-CM | POA: Diagnosis not present

## 2022-05-11 ENCOUNTER — Other Ambulatory Visit (HOSPITAL_COMMUNITY)
Admission: RE | Admit: 2022-05-11 | Discharge: 2022-05-11 | Disposition: A | Discharge status: Home / Self Care | Payer: 59 | Source: Ambulatory Visit | Attending: Hematology | Admitting: Hematology

## 2022-05-11 DIAGNOSIS — C189 Malignant neoplasm of colon, unspecified: Secondary | ICD-10-CM | POA: Insufficient documentation

## 2022-05-11 DIAGNOSIS — C787 Secondary malignant neoplasm of liver and intrahepatic bile duct: Secondary | ICD-10-CM | POA: Diagnosis not present

## 2022-05-11 DIAGNOSIS — C20 Malignant neoplasm of rectum: Secondary | ICD-10-CM | POA: Diagnosis not present

## 2022-05-11 LAB — CBC WITH DIFFERENTIAL/PLATELET
Abs Immature Granulocytes: 0.01 10*3/uL (ref 0.00–0.07)
Basophils Absolute: 0 10*3/uL (ref 0.0–0.1)
Basophils Relative: 0 %
Eosinophils Absolute: 0 10*3/uL (ref 0.0–0.5)
Eosinophils Relative: 1 %
HCT: 35.3 % — ABNORMAL LOW (ref 36.0–46.0)
Hemoglobin: 12.3 g/dL (ref 12.0–15.0)
Immature Granulocytes: 0 %
Lymphocytes Relative: 19 %
Lymphs Abs: 0.6 10*3/uL — ABNORMAL LOW (ref 0.7–4.0)
MCH: 34.6 pg — ABNORMAL HIGH (ref 26.0–34.0)
MCHC: 34.8 g/dL (ref 30.0–36.0)
MCV: 99.2 fL (ref 80.0–100.0)
Monocytes Absolute: 0.2 10*3/uL (ref 0.1–1.0)
Monocytes Relative: 7 %
Neutro Abs: 2.2 10*3/uL (ref 1.7–7.7)
Neutrophils Relative %: 73 %
Platelets: 117 10*3/uL — ABNORMAL LOW (ref 150–400)
RBC: 3.56 MIL/uL — ABNORMAL LOW (ref 3.87–5.11)
RDW: 13.1 % (ref 11.5–15.5)
WBC: 3.1 10*3/uL — ABNORMAL LOW (ref 4.0–10.5)
nRBC: 0 % (ref 0.0–0.2)

## 2022-05-11 LAB — COMPREHENSIVE METABOLIC PANEL
ALT: 12 U/L (ref 0–44)
AST: 21 U/L (ref 15–41)
Albumin: 4.1 g/dL (ref 3.5–5.0)
Alkaline Phosphatase: 131 U/L — ABNORMAL HIGH (ref 38–126)
Anion gap: 10 (ref 5–15)
BUN: 17 mg/dL (ref 6–20)
CO2: 25 mmol/L (ref 22–32)
Calcium: 8.6 mg/dL — ABNORMAL LOW (ref 8.9–10.3)
Chloride: 100 mmol/L (ref 98–111)
Creatinine, Ser: 0.91 mg/dL (ref 0.44–1.00)
GFR, Estimated: >60 mL/min (ref >60)
Glucose, Bld: 95 mg/dL (ref 70–99)
Potassium: 3.4 mmol/L — ABNORMAL LOW (ref 3.5–5.1)
Sodium: 135 mmol/L (ref 135–145)
Total Bilirubin: 2.5 mg/dL — ABNORMAL HIGH (ref 0.3–1.2)
Total Protein: 7.3 g/dL (ref 6.5–8.1)

## 2022-05-11 LAB — IRON AND TIBC
Iron: 96 ug/dL (ref 28–170)
Saturation Ratios: 26 % (ref 10.4–31.8)
TIBC: 364 ug/dL (ref 250–450)
UIBC: 268 ug/dL

## 2022-05-11 LAB — FERRITIN: Ferritin: 61 ng/mL (ref 11–307)

## 2022-05-13 ENCOUNTER — Ambulatory Visit (HOSPITAL_COMMUNITY)
Admission: RE | Admit: 2022-05-13 | Discharge: 2022-05-13 | Disposition: A | Discharge status: Home / Self Care | Payer: 59 | Source: Ambulatory Visit | Attending: Hematology | Admitting: Hematology

## 2022-05-13 DIAGNOSIS — R918 Other nonspecific abnormal finding of lung field: Secondary | ICD-10-CM | POA: Diagnosis not present

## 2022-05-13 DIAGNOSIS — C787 Secondary malignant neoplasm of liver and intrahepatic bile duct: Secondary | ICD-10-CM | POA: Diagnosis not present

## 2022-05-13 DIAGNOSIS — C189 Malignant neoplasm of colon, unspecified: Secondary | ICD-10-CM | POA: Diagnosis not present

## 2022-05-13 DIAGNOSIS — D734 Cyst of spleen: Secondary | ICD-10-CM | POA: Diagnosis not present

## 2022-05-13 DIAGNOSIS — K769 Liver disease, unspecified: Secondary | ICD-10-CM | POA: Diagnosis not present

## 2022-05-13 DIAGNOSIS — C20 Malignant neoplasm of rectum: Secondary | ICD-10-CM | POA: Diagnosis not present

## 2022-05-13 DIAGNOSIS — I779 Disorder of arteries and arterioles, unspecified: Secondary | ICD-10-CM | POA: Diagnosis not present

## 2022-05-13 DIAGNOSIS — I359 Nonrheumatic aortic valve disorder, unspecified: Secondary | ICD-10-CM | POA: Diagnosis not present

## 2022-05-13 LAB — CEA: CEA: 20.1 ng/mL — ABNORMAL HIGH (ref 0.0–4.7)

## 2022-05-13 MED ORDER — IOHEXOL 300 MG/ML  SOLN
100.0000 mL | Freq: Once | INTRAMUSCULAR | Status: AC | PRN
  Administered 2022-05-13: 100 mL via INTRAVENOUS

## 2022-05-17 ENCOUNTER — Inpatient Hospital Stay: Payer: 59 | Attending: Hematology | Admitting: Hematology

## 2022-05-17 ENCOUNTER — Ambulatory Visit: Payer: 59 | Admitting: Hematology

## 2022-05-17 VITALS — BP 113/83 | HR 88 | Temp 98.5°F | Resp 18 | Wt 139.4 lb

## 2022-05-17 DIAGNOSIS — Z923 Personal history of irradiation: Secondary | ICD-10-CM | POA: Diagnosis not present

## 2022-05-17 DIAGNOSIS — Z8249 Family history of ischemic heart disease and other diseases of the circulatory system: Secondary | ICD-10-CM | POA: Insufficient documentation

## 2022-05-17 DIAGNOSIS — Z9221 Personal history of antineoplastic chemotherapy: Secondary | ICD-10-CM | POA: Diagnosis not present

## 2022-05-17 DIAGNOSIS — D696 Thrombocytopenia, unspecified: Secondary | ICD-10-CM | POA: Diagnosis not present

## 2022-05-17 DIAGNOSIS — R200 Anesthesia of skin: Secondary | ICD-10-CM | POA: Diagnosis not present

## 2022-05-17 DIAGNOSIS — Z87891 Personal history of nicotine dependence: Secondary | ICD-10-CM | POA: Diagnosis not present

## 2022-05-17 DIAGNOSIS — Z801 Family history of malignant neoplasm of trachea, bronchus and lung: Secondary | ICD-10-CM | POA: Diagnosis not present

## 2022-05-17 DIAGNOSIS — M2550 Pain in unspecified joint: Secondary | ICD-10-CM | POA: Insufficient documentation

## 2022-05-17 DIAGNOSIS — Z85850 Personal history of malignant neoplasm of thyroid: Secondary | ICD-10-CM | POA: Insufficient documentation

## 2022-05-17 DIAGNOSIS — C787 Secondary malignant neoplasm of liver and intrahepatic bile duct: Secondary | ICD-10-CM

## 2022-05-17 DIAGNOSIS — Z8042 Family history of malignant neoplasm of prostate: Secondary | ICD-10-CM | POA: Insufficient documentation

## 2022-05-17 DIAGNOSIS — E785 Hyperlipidemia, unspecified: Secondary | ICD-10-CM | POA: Insufficient documentation

## 2022-05-17 DIAGNOSIS — D649 Anemia, unspecified: Secondary | ICD-10-CM | POA: Insufficient documentation

## 2022-05-17 DIAGNOSIS — C189 Malignant neoplasm of colon, unspecified: Secondary | ICD-10-CM | POA: Diagnosis not present

## 2022-05-17 DIAGNOSIS — Z79899 Other long term (current) drug therapy: Secondary | ICD-10-CM | POA: Diagnosis not present

## 2022-05-17 DIAGNOSIS — Z808 Family history of malignant neoplasm of other organs or systems: Secondary | ICD-10-CM | POA: Insufficient documentation

## 2022-05-17 DIAGNOSIS — M25511 Pain in right shoulder: Secondary | ICD-10-CM | POA: Diagnosis not present

## 2022-05-17 DIAGNOSIS — C20 Malignant neoplasm of rectum: Secondary | ICD-10-CM | POA: Diagnosis not present

## 2022-05-17 DIAGNOSIS — D72819 Decreased white blood cell count, unspecified: Secondary | ICD-10-CM | POA: Diagnosis not present

## 2022-05-17 NOTE — Patient Instructions (Signed)
[facility]  Discharge Instructions  You were seen and examined today by Dr. Delton Coombes.  Dr. Delton Coombes discussed your most recent lab work and CT scan which revealed that the one nodule has increased by about 4 mm.   Dr. Delton Coombes recommends having a PET scan to see if the cancer has spread.   Follow-up as scheduled after PET scan.    Thank you for choosing [facility] to provide your oncology and hematology care.   To afford each patient quality time with our provider, please arrive at least 15 minutes before your scheduled appointment time. You may need to reschedule your appointment if you arrive late (10 or more minutes). Arriving late affects you and other patients whose appointments are after yours.  Also, if you miss three or more appointments without notifying the office, you may be dismissed from the clinic at the provider's discretion.    Again, thank you for choosing [facility].  Our hope is that these requests will decrease the amount of time that you wait before being seen by our physicians.   If you have a lab appointment with the [facility] please come in thru the Main Entrance and check in at the main information desk.           _____________________________________________________________  Should you have questions after your visit to [facility], please contact our office at [phone] and follow the prompts.  Our office hours are 8:00 a.m. to 4:30 p.m. Monday - Thursday and 8:00 a.m. to 2:30 p.m. Friday.  Please note that voicemails left after 4:00 p.m. may not be returned until the following business day.  We are closed weekends and all major holidays.  You do have access to a nurse 24-7, just call the main number to the clinic [phone] and do not press any options, hold on the line and a nurse will answer the phone.    For prescription refill requests, have your pharmacy  contact our office and allow 72 hours.    Masks are optional in the cancer centers. If you would like for your care team to wear a mask while they are taking care of you, please let them know. You may have one support person who is at least 59 years old accompany you for your appointments.

## 2022-05-17 NOTE — Progress Notes (Signed)
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 FOLFOX & vectibix x 7 cycles from 03/14/2018 to 06/27/2018. 2. XRT with Xeloda from 07/30/2018 to 09/06/2018. 3. Right liver lesion microwave ablation on 10/15/2018. 4. Low anterior resection and diverting ileostomy on 12/07/2018. 5. SBRT to liver 60 Gy in 5 fractions from 11/15/2019 to 12/13/2019.  NGS Results: Foundation 1 K-RAS/NRAS wild-type, MS--stable, TMB--low  CURRENT THERAPY: surveillance  BRIEF ONCOLOGIC HISTORY:  Oncology History  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[facility])  02/26/2018 Initial Diagnosis   Rectal cancer ([facility])   03/13/2018 Cancer Staging   Staging form: Colon and Rectum, AJCC 8th Edition - Clinical stage from 03/13/2018: Stage IVA (cT3, cN1b, cM1a) - Signed by Derek Jack, MD on 03/13/2018   03/14/2018 - 06/29/2018 Chemotherapy   The patient had palonosetron (ALOXI) injection 0.25 mg, 0.25 mg, Intravenous,  Once, 7 of 8 cycles Administration: 0.25 mg (03/14/2018), 0.25 mg (03/27/2018), 0.25 mg (04/18/2018), 0.25 mg (05/02/2018), 0.25 mg (05/16/2018), 0.25 mg (06/05/2018), 0.25 mg (06/27/2018) leucovorin 800 mg in dextrose 5 % 250 mL infusion, 772 mg, Intravenous,  Once, 7 of 8 cycles Administration: 800 mg (03/14/2018), 800 mg (03/27/2018), 800 mg (05/02/2018), 800 mg (05/16/2018), 700 mg (04/18/2018), 800 mg (06/05/2018), 800 mg (06/27/2018) oxaliplatin (ELOXATIN) 165 mg in dextrose 5 % 500 mL chemo infusion, 85 mg/m2 = 165 mg, Intravenous,  Once, 6 of 7 cycles Dose modification: 68 mg/m2 (80 % of original dose 85 mg/m2, Cycle 4, Reason: Provider Judgment) Administration: 165 mg (03/14/2018), 165 mg (03/27/2018), 130 mg (05/02/2018), 130 mg (05/16/2018), 130 mg (06/05/2018), 130 mg  (06/27/2018) panitumumab (VECTIBIX) 500 mg in sodium chloride 0.9 % 100 mL chemo infusion, 480 mg, Intravenous,  Once, 4 of 5 cycles Administration: 500 mg (04/18/2018), 480 mg (05/02/2018), 480 mg (05/16/2018), 480 mg (06/05/2018) fluorouracil (ADRUCIL) chemo injection 750 mg, 400 mg/m2 = 750 mg (100 % of original dose 400 mg/m2), Intravenous,  Once, 6 of 7 cycles Dose modification: 400 mg/m2 (original dose 400 mg/m2, Cycle 1), 400 mg/m2 (original dose 400 mg/m2, Cycle 3) Administration: 750 mg (03/14/2018), 750 mg (04/18/2018), 750 mg (05/02/2018), 750 mg (05/16/2018), 750 mg (06/05/2018), 750 mg (06/27/2018) fosaprepitant (EMEND) 150 mg, dexamethasone (DECADRON) 12 mg in sodium chloride 0.9 % 145 mL IVPB, , Intravenous,  Once, 4 of 5 cycles Administration:  (05/02/2018),  (05/16/2018),  (06/05/2018),  (06/27/2018) fluorouracil (ADRUCIL) 4,650 mg in sodium chloride 0.9 % 57 mL chemo infusion, 2,400 mg/m2 = 4,650 mg, Intravenous, 1 Day/Dose, 7 of 8 cycles Administration: 4,650 mg (03/14/2018), 4,650 mg (03/27/2018), 4,650 mg (04/18/2018), 4,650 mg (05/02/2018), 4,650 mg (05/16/2018), 4,650 mg (06/05/2018), 4,650 mg (06/27/2018)  for chemotherapy treatment.      CANCER STAGING:  Cancer Staging  Malignant neoplasm of rectum [facility]) Staging form: Colon and Rectum, AJCC 8th Edition - Clinical stage from 03/13/2018: Stage IVA (cT3, cN1b, cM1a) - Signed by Derek Jack, MD on 03/13/2018   INTERVAL HISTORY:  [name], a 57 y.o. female, seen for follow-up of metastatic colon cancer to the liver and lung.  Reports right shoulder pain since January of this year.  Also has slight cough.  She reportedly had COVID before Thanksgiving.  No other infections reported.  Appetite has been good.  Weight is  well-maintained.  REVIEW OF SYSTEMS:  Review of Systems  Constitutional:  Negative for appetite change, fatigue and unexpected weight change (+10 lbs).  Respiratory:  Negative for hemoptysis.   Musculoskeletal:   Positive for arthralgias (right shoulder).  Neurological:  Positive for numbness (L big toe).  All other systems reviewed and are negative.   PAST MEDICAL/SURGICAL HISTORY:  Past Medical History:  Diagnosis Date   Anxiety    Cancer ([place]) 2013   thyroid   Endometrial polyp    Endometrial thickening on ultra sound    GERD (gastroesophageal reflux disease)    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   Worse in her 72s when she was ill   H/O Hashimoto thyroiditis    History of chemotherapy    History of radiation therapy    History of thyroid cancer no recurrence   2013--  s/p  left lobe thyroidectomy--  follicular varient papillary / lymphocyctic thyroiditis   Hyperlipidemia 11/09/2015   Hypothyroidism    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[facility]) 02/26/2018   Past Surgical History:  Procedure Laterality Date   BIOPSY  02/19/2018   Procedure: BIOPSY;  Surgeon: Rogene Houston, MD;  [facility];  Service: Endoscopy;;  rectum   COLONOSCOPY N/A 08/20/2015   Procedure: COLONOSCOPY;  Surgeon: Rogene Houston, MD;  [facility];  Service: Endoscopy;  Laterality: N/A;  730   COLONOSCOPY WITH PROPOFOL N/A 07/21/2021   Procedure: COLONOSCOPY WITH PROPOFOL;  Surgeon: Rogene Houston, MD;  [facility];  Service: Endoscopy;  Laterality: N/A;  10:10   D & C HYSTEROOSCOPY W/ THERMACHOICE ENDOMETRIAL ABLATION  08-13-2004   DIVERTING ILEOSTOMY N/A 12/07/2018   Procedure: DIVERTING LOOP ILEOSTOMY;  Surgeon: Leighton Ruff, MD;  Location: [facility];  Service: General;  Laterality: N/A;   FLEXIBLE SIGMOIDOSCOPY N/A 02/19/2018   Procedure: FLEXIBLE SIGMOIDOSCOPY;  Surgeon: Rogene Houston, MD;  [facility];  Service: Endoscopy;  Laterality: N/A;   HYSTEROSCOPY WITH D & C N/A 04/30/2015   Procedure: DILATATION AND CURETTAGE / INTENDED HYSTEROSCOPY;  Surgeon: Dian Queen, MD;  Location: [facility];  Service: Gynecology;  Laterality: N/A;   ILEOSTOMY CLOSURE N/A  04/17/2019   Procedure: LOOP ILEOSTOMY REVERSAL;  Surgeon: Leighton Ruff, MD;  Location: [facility];  Service: General;  Laterality: N/A;   IR US GUIDE BX ASP/DRAIN  03/09/2018   LAPAROSCOPIC CHOLECYSTECTOMY  04/1996   LAPAROSCOPY N/A 12/11/2018   Procedure: LAPAROSCOPY  ILEOSTOMY REVISION AND ABDOMINAL WASHOUT;  Surgeon: Leighton Ruff, MD;  [facility];  Service: General;  Laterality: N/A;   Liver Microwave   10/17/2018   POLYPECTOMY  08/20/2015   Procedure: POLYPECTOMY;  Surgeon: Rogene Houston, MD;  [facility];  Service: Endoscopy;;  Splenic flexure polypectomy   POLYPECTOMY  02/19/2018   Procedure: POLYPECTOMY;  Surgeon: Rogene Houston, MD;  [facility];  Service: Endoscopy;;  rectum   PORTACATH PLACEMENT N/A 03/14/2018   Procedure: INSERTION PORT-A-CATH;  Surgeon: Aviva Signs, MD;  Location: [facility];  Service: General;  Laterality: N/A;   REDUCTION INCARCERATED UTERUS  06-20-2000   intrauterine preg. 13 wks /  urinary retention   THYROID LOBECTOMY  11/24/2011   Procedure: THYROID LOBECTOMY;  Surgeon: Earnstine Regal, MD;  [facility];  Service: General;  Laterality: Left;  Left Thyroid Lobectomy   TUBAL LIGATION  2002   XI ROBOTIC ASSISTED LOWER ANTERIOR RESECTION N/A 12/07/2018   Procedure: XI ROBOTIC ASSISTED LOWER ANTERIOR  RESECTION, SPENIC FLEXURE IMMOBILIZATION, COLOANAL ANASTOMOSIS, RIGID PROCTOSCOPY;  Surgeon: Leighton Ruff, MD;  [facility];  Service: General;  Laterality: N/A;    SOCIAL HISTORY:  Social History   Socioeconomic History   Marital status: Married    Spouse name: Not on file   Number of children: 4   Years of education: Not on file   Highest education level: Not on file  Occupational History    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   Smoking status: Former    Packs/day: 0.25    Years: 10.00    Total pack years: 2.50    Types: Cigarettes    Quit date: 10/31/1991    Years since quitting: 30.5   Smokeless  tobacco: Never  Vaping Use   Vaping Use: Never used  Substance and Sexual Activity   Alcohol use: No   Drug use: No   Sexual activity: Not Currently  Other Topics Concern   Not on file  Social History Narrative   Lives with husband [name] and 44 year old son [name]   Husband is Interior and spatial designer of [employer]   Social Determinants of Health   Financial Resource Strain: Low Risk  (02/26/2018)   Overall Financial Resource Strain (CARDIA)    Difficulty of Paying Living Expenses: Not hard at all  Food Insecurity: No Food Insecurity (02/26/2018)   Hunger Vital Sign    Worried About Running Out of Food in the Last Year: Never true    [place] in the Last Year: Never true  Transportation Needs: No Transportation Needs (02/26/2018)   PRAPARE - Hydrologist (Medical): No    Lack of Transportation (Non-Medical): No  Physical Activity: Inactive (02/26/2018)   Exercise Vital Sign    Days of Exercise per Week: 0 days    Minutes of Exercise per Session: 0 min  Stress: Stress Concern Present (02/26/2018)   [place]    Feeling of Stress : To some extent  Social Connections: Socially Integrated (02/26/2018)   Social Connection and Isolation Panel [NHANES]    Frequency of Communication with Friends and Family: More than three times a week    Frequency of Social Gatherings with Friends and Family: Twice a week    Attends Religious Services: More than 4 times per year    Active Member of [employer] or Organizations: Yes    Attends Music therapist: More than 4 times per year    Marital Status: Married  Human resources officer Violence: Not At Risk (02/26/2018)   Humiliation, Afraid, Rape, and Kick questionnaire    Fear of Current or Ex-Partner: No    Emotionally Abused: No    Physically Abused: No    Sexually Abused: No    FAMILY HISTORY:  Family History  Problem Relation Age of Onset    Hypertension Mother    Hypertension Father    Prostate cancer Father    Cancer Maternal Aunt        spine/back   Cancer Paternal Grandfather        lung   Healthy Son    Healthy Son    Healthy Son    Healthy Son    Colon cancer Neg Hx     CURRENT MEDICATIONS:  Current Outpatient Medications  Medication Sig Dispense Refill   ALPRAZolam (XANAX) 0.5 MG tablet Take 1 tablet (0.5 mg total) by mouth 3 (three) times daily  as needed for anxiety. 90 tablet 5   amitriptyline (ELAVIL) 10 MG tablet Take 1 tablet (10 mg total) by mouth 2 (two) times daily. 180 tablet 3   ARMOUR THYROID 60 MG tablet Take 1 tablet (60 mg total) by mouth daily. 90 tablet 1   ARMOUR THYROID 60 MG tablet Take 1 tablet (60 mg total) by mouth daily. 90 tablet 1   loperamide (IMODIUM) 2 MG capsule Take 1 capsule (2 mg total) by mouth 3 (three) times daily. (Patient taking differently: Take 2 mg by mouth daily.) 30 capsule 0   pregabalin (LYRICA) 25 MG capsule Take 1 capsule (25 mg total) by mouth 2 (two) times daily. 60 capsule 3   prochlorperazine (COMPAZINE) 10 MG tablet Take 1 tablet (10 mg total) by mouth every 6 (six) hours as needed for nausea or vomiting. 60 tablet 3   No current facility-administered medications for this visit.   Facility-Administered Medications Ordered in Other Visits  Medication Dose Route Frequency Provider Last Rate Last Admin   clindamycin (CLEOCIN) 900 mg in dextrose 5 % 50 mL IVPB  900 mg Intravenous 60 min Pre-Op Leighton Ruff, MD       And   gentamicin (GARAMYCIN) 350 mg in dextrose 5 % 50 mL IVPB  5 mg/kg Intravenous 60 min Pre-Op Leighton Ruff, MD       clindamycin (CLEOCIN) 900 mg in dextrose 5 % 50 mL IVPB  900 mg Intravenous 60 min Pre-Op Leighton Ruff, MD       And   gentamicin (GARAMYCIN) 5 mg/kg in dextrose 5 % 50 mL IVPB  5 mg/kg Intravenous 60 min Pre-Op Leighton Ruff, MD       sodium chloride 0.9 % 1,000 mL with potassium chloride 20 mEq, magnesium sulfate 2 g infusion    Intravenous Once Derek Jack, MD        ALLERGIES:  Allergies  Allergen Reactions   Demerol [Meperidine] Itching    All over the body.   Penicillins Hives     childhood, does not remember if it spread all over the body or not. Did it involve swelling of the face/tongue/throat, SOB, or low BP? No Did it involve sudden or severe rash/hives, skin peeling, or any reaction on the inside of your mouth or nose? Yes Did you need to seek medical attention at a hospital or doctor's office? Yes When did it last happen?      childhood allergy If all above answers are "NO", may proceed with cephalosporin use.    Levaquin [Levofloxacin] Other (See Comments)    Patient has Aortic Aneurysm and is not indicated with this diagnosis   Other Hives and Other (See Comments)    Fresh coconut    Vancomycin Rash    Will need Benadryl prior to administration per IV. "Red man syndrome"    PHYSICAL EXAM:  Performance status (ECOG): 0 - Asymptomatic  Vitals:   05/17/22 1446  BP: 113/83  Pulse: 88  Resp: 18  Temp: 98.5 F (36.9 C)  SpO2: 100%   Wt Readings from Last 3 Encounters:  05/17/22 139 lb 6.4 oz (63.2 kg)  03/31/22 139 lb 3.2 oz (63.1 kg)  02/28/22 140 lb 9.6 oz (63.8 kg)   Physical Exam   LABORATORY DATA:  I have reviewed the labs as listed.     Latest Ref Rng & Units 05/11/2022    2:00 PM 01/04/2022    1:52 PM 08/02/2021    2:04 PM  CBC  WBC  4.0 - 10.5 K/uL 3.1  3.0  3.2   Hemoglobin 12.0 - 15.0 g/dL 12.3  10.4  12.2   Hematocrit 36.0 - 46.0 % 35.3  29.8  34.9   Platelets 150 - 400 K/uL 117  108  109       Latest Ref Rng & Units 05/11/2022    2:00 PM 01/04/2022    1:52 PM 08/02/2021    2:04 PM  CMP  Glucose 70 - 99 mg/dL 95  95  97   BUN 6 - 20 mg/dL 17  16  14   $ Creatinine 0.44 - 1.00 mg/dL 0.91  0.78  0.93   Sodium 135 - 145 mmol/L 135  138  136   Potassium 3.5 - 5.1 mmol/L 3.4  3.9  3.5   Chloride 98 - 111 mmol/L 100  106  103   CO2 22 - 32 mmol/L 25  26  26    $ Calcium 8.9 - 10.3 mg/dL 8.6  8.5  8.9   Total Protein 6.5 - 8.1 g/dL 7.3  6.8  7.7   Total Bilirubin 0.3 - 1.2 mg/dL 2.5  2.2  2.1   Alkaline Phos 38 - 126 U/L 131  100  78   AST 15 - 41 U/L 21  19  21   $ ALT 0 - 44 U/L 12  11  14     $ DIAGNOSTIC IMAGING:  I have independently reviewed the scans and discussed with the patient. CT CHEST ABDOMEN PELVIS W CONTRAST  Result Date: 05/13/2022 CLINICAL DATA:  Colorectal carcinoma. CEA tumor marker elevated. Insert by contrast CT EXAM: CT CHEST, ABDOMEN, AND PELVIS WITH CONTRAST TECHNIQUE: Multidetector CT imaging of the chest, abdomen and pelvis was performed following the standard protocol during bolus administration of intravenous contrast. RADIATION DOSE REDUCTION: This exam was performed according to the departmental dose-optimization program which includes automated exposure control, adjustment of the mA and/or kV according to patient size and/or use of iterative reconstruction technique. CONTRAST:  154m OMNIPAQUE IOHEXOL 300 MG/ML  SOLN COMPARISON:  None Available. FINDINGS: CT CHEST FINDINGS Cardiovascular: Ascending thoracic aorta measures 40 mm in diameter (image 101/sagittal series 5) Port in the anterior chest wall with tip in distal SVC. Mediastinum/Nodes: No axillary or supraclavicular adenopathy. No mediastinal or hilar adenopathy. No pericardial fluid. Esophagus normal. Lungs/Pleura: LEFT infrahilar consolidation with irregular margins (image 79/4) is increased from prior. Prior radiation treatment. Medial RIGHT upper lobe nodularity with a rim of ground-glass opacity is increased from prior. The central solid portion measures 1.2 cm compared to 1.2 cm on prior (image 32/4) LEFT upper lobe nodule along the fissure measures 7 mm on image 56/4 compared to 4 mm. Musculoskeletal: No aggressive osseous lesion. CT ABDOMEN AND PELVIS FINDINGS Hepatobiliary: Posterior RIGHT hepatic lobe lesion measures 17 mm (63) compared to 17 mm. More inferior RIGHT  hepatic lobe lesion measures 22 mm also unchanged. Postcholecystectomy. Pancreas: Pancreas is normal. No ductal dilatation. No pancreatic inflammation. Spleen: Cystic lesion in the spleen is unchanged. Adrenals/urinary tract: Adrenal glands and kidneys are normal. The ureters and bladder normal. Stomach/Bowel: The stomach, duodenum, and small bowel normal. LEFT low rectal anastomosis (image 128). No pelvic lymphadenopathy.  No mesenteric lymphadenopathy. Vascular/Lymphatic: Abdominal aorta is normal caliber. There is no retroperitoneal or periportal lymphadenopathy. No pelvic lymphadenopathy. Reproductive: Unremarkable Other: No peritoneal metastasis Musculoskeletal: No aggressive osseous lesion. IMPRESSION: CHEST IMPRESSION: 1. Increase in LEFT infrahilar consolidation with irregular margins. Differential includes post radiation change versus local recurrence. Favor post radiation change.  2. Stable RIGHT upper lobe pulmonary nodule with a rim of ground-glass opacity. Recommend continued observation. 3. Interval increase in size of solitary LEFT upper lobe pulmonary nodule along the fissure. Enlargement is concerning for metastatic lesion. Recommend repeat PET-CT scan. 4. No mediastinal adenopathy. 5. Stable 4 cm ascending thoracic aorta. Recommend annual imaging followup by CTA or MRA. This recommendation follows 2010 ACCF/AHA/AATS/ACR/ASA/SCA/SCAI/SIR/STS/SVM Guidelines for the Diagnosis and Management of Patients with Thoracic Aortic Disease. Circulation. 2010; [id number]. Aortic aneurysm NOS (ICD10-I71.9) PELVIS IMPRESSION: 1. Stable treated hepatic metastasis. 2. No evidence of metastatic disease in the abdomen pelvis. 3. No evidence of local recurrence at the LEFT low rectal anastomosis. Electronically Signed   By: Suzy Bouchard M.D.   On: 05/13/2022 16:57     ASSESSMENT:  1.  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  Liver biopsy on 03/09/2018 consistent with adenocarcinoma. 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 -7 cycles of FOLFOX with vectibix completed on 06/27/2018. -XRT with Xeloda from 07/30/2018 through 09/06/2018. -Right liver lesion microwave ablation on 10/15/2018 at [facility]. -Low anterior resection and diverting ileostomy on 12/07/2018, pathology [id number], 0/6 lymph nodes positive, margins negative. -Loop ileostomy reversal on 04/17/2019. -CTAP on 07/10/2019 showed left lower lobe pulmonary nodule increased by 1 mm, measuring 8 mm.  3 mm left upper lobe lung nodule is unchanged.  Probable 4 mm right apical pulmonary nodule felt to be enlarged from 2 mm from October 2020.  Vague hypoattenuation within the posterior right hepatic lobe measures 1.5 cm, measuring 1.2 cm in October 2020.  Subtle subcapsular right hepatic lobe hypoattenuating 1 cm lesion was not seen on prior scans. -We reviewed results of MRI of the abdomen with and without contrast from 07/16/2019.  2 enhancing lesions in the right hepatic lobe, largest one measuring 1.5 x 1.0 cm.  Inferior lesion is measured as 1 cm.  Stable splenomegaly. -CEA is 2.4. -Microwave ablation of the right hepatic lobe lesion by Dr. Maudie Mercury around 08/15/2019. -SBRT to the left lower lobe lung lesion and left liver lobe lesion on 12/13/2019 at [facility]. -CT CAP from 01/22/2020 showed interval enlargement of medial right pulmonary nodule measuring about 7 mm, previously 5 mm.  Evaluation of the new lesion in the left lobe of the liver is limited by metallic fiducial marking clips although lesion appears to be diminished.  Low attenuation ablation sites in the right lobe of the liver unchanged.  Left lower lobe lung lesion also decreased in size. - CT CAP on 04/01/2020 showed stable 7 mm right upper lobe lung nodule.  Left lower lobe nodule/scarring is stable.  2 ablation defects in the posterior and inferior right hepatic lobe are stable in appearance without  evidence of residual contrast-enhancement.  2 cm cystic lesion in the anterior aspect of the spleen is stable.  Stable mild splenomegaly. - Right upper lobe lesion SBRT from 08/06/2020 through 08/13/2020. - 3 left lung lesions on subcarinal lymph node IMRT from 09/21/2021 through 10/05/2021.   2.  Hyperbilirubinemia: -Total bilirubin is 5.1.  She has Gilbert's syndrome.  Baseline is between 2 and 3.   3.  Hypothyroidism: -She is on Armour Thyroid.  TSH is 3.6.   4.  Weight loss: -She had 10 pound weight loss since her last visit 3 months ago.  However she had ileostomy reversal and is adjusting to new food habits.     PLAN:  1.  Stage IV rectal adenocarcinoma: - We  reviewed CT CAP from 05/13/2022: LUL nodule 7 mm, previously 4 mm.  Left infrahilar consolidation with irregular margins is increased from prior due to radiation treatment.  Medial right upper lobe nodularity is increased from prior.  Liver lesions are stable.  No mediastinal adenopathy. - CEA has increased to 20.1 from 5.1 previously. - I have recommended PET scan for further evaluation.   2.  Hyperbilirubinemia: - Total bilirubin of 2.5 from Gilbert's syndrome.   3.  Normocytic anemia: - Ferritin is 61, percent saturation 26.  Hemoglobin is 12.3.   4.  Leukopenia and thrombocytopenia: - She has intermittent mild leukopenia and thrombocytopenia from splenomegaly.  Previous nutritional deficiency workup was negative.  PLT is 117 and white count is 3.1.   Orders placed this encounter:  No orders of the defined types were placed in this encounter.     Derek Jack, MD [place] [phone]

## 2022-05-20 ENCOUNTER — Encounter: Payer: Self-pay | Admitting: Orthopedic Surgery

## 2022-05-20 ENCOUNTER — Ambulatory Visit: Payer: Self-pay

## 2022-05-20 ENCOUNTER — Ambulatory Visit (INDEPENDENT_AMBULATORY_CARE_PROVIDER_SITE_OTHER): Payer: 59 | Admitting: Orthopedic Surgery

## 2022-05-20 ENCOUNTER — Ambulatory Visit (INDEPENDENT_AMBULATORY_CARE_PROVIDER_SITE_OTHER): Payer: 59

## 2022-05-20 ENCOUNTER — Other Ambulatory Visit (HOSPITAL_COMMUNITY): Payer: Self-pay

## 2022-05-20 VITALS — Ht 66.0 in | Wt 139.0 lb

## 2022-05-20 DIAGNOSIS — M25511 Pain in right shoulder: Secondary | ICD-10-CM | POA: Diagnosis not present

## 2022-05-20 DIAGNOSIS — M791 Myalgia, unspecified site: Secondary | ICD-10-CM | POA: Diagnosis not present

## 2022-05-20 DIAGNOSIS — M542 Cervicalgia: Secondary | ICD-10-CM | POA: Diagnosis not present

## 2022-05-20 MED ORDER — PREDNISONE 10 MG (48) PO TBPK
ORAL_TABLET | Freq: Every day | ORAL | 0 refills | Status: DC
  Filled 2022-05-20: qty 48, 12d supply, fill #0

## 2022-05-20 MED ORDER — TIZANIDINE HCL 4 MG PO TABS
4.0000 mg | ORAL_TABLET | Freq: Four times a day (QID) | ORAL | 2 refills | Status: DC | PRN
  Filled 2022-05-20: qty 56, 14d supply, fill #0

## 2022-05-20 MED ORDER — METHYLPREDNISOLONE ACETATE 40 MG/ML IJ SUSP
40.0000 mg | Freq: Once | INTRAMUSCULAR | Status: AC
  Administered 2022-05-20: 40 mg via INTRA_ARTICULAR

## 2022-05-20 NOTE — Progress Notes (Signed)
Chief Complaint  Patient presents with   Neck Pain    DOWN RIGHT ARM    Shoulder Pain    Right    57 year old female presents for new onset right shoulder and neck pain.  Patient reports that sometime in early January she started having pain in the right side of her upper thoracic lower cervical spine which radiates to her right shoulder associated with a burning tingling sensation.  The patient works from home on a computer and has tried to change her workstation she does note increased pain when her arm is out away from her body working on the computer  She has a history of frozen shoulder and reports some limited mobility from that but has an excellent overall functional range of motion.  She has had a of malignant metastatic colon cancer  Apparently there is some concern now about recurrence  Dr. Wolfgang Phoenix gave her some Lyrica which she said initially helped at 25 mg twice a day but since a couple of days has not really done much. She says the noroc '5mg'$  doesn't touch it   Examination  Physical Exam Constitutional:      Comments: Thin ectomorphic  HENT:     Head: Normocephalic and atraumatic.  Eyes:     General: No scleral icterus.    Conjunctiva/sclera: Conjunctivae normal.  Cardiovascular:     Rate and Rhythm: Normal rate.     Pulses: Normal pulses.  Musculoskeletal:     Comments: Cervical spine examination.  She has no tenderness in the cervical spine posteriorly.  Just at the base of the cervical spine to the right she has some muscular tenderness and tightness this tenderness tracks over to the superior angle and border of the scapula on the right side  This is not noted on the left  Overall her range of motion is pretty good she has some tightness when she lateral flexes to the left but overall just some discomfort and no real restriction or radicular symptoms are reproduced  Shoulder motion again is very good the deficits she describes are very minimal cuff strength is  normal.  Skin:    General: Skin is warm and dry.     Capillary Refill: Capillary refill takes less than 2 seconds.  Neurological:     General: No focal deficit present.     Mental Status: She is oriented to person, place, and time.     Motor: No weakness.  Psychiatric:        Mood and Affect: Mood normal.        Behavior: Behavior normal.        Thought Content: Thought content normal.        Judgment: Judgment normal.    Outside imaging of the cervical spine shows that she does have degenerative disc disease mainly in the uncovertebral joints and some narrowing at C5 and 6 and anterior osteophyte formation  Encounter Diagnoses  Name Primary?   Neck pain Yes   Acute pain of right shoulder    Trigger point of right side of body    Assessment and plan  I think she has a trigger point mostly.  I gave her an injection in the area of of maximal tenderness but I also told her that she will need multiple modalities of treatment to try to get rid of this.  I am going to start her on prednisone tizanidine order physical therapy and continue the Lyrica  Meds ordered this encounter  Medications  methylPREDNISolone acetate (DEPO-MEDROL) injection 40 mg

## 2022-05-20 NOTE — Patient Instructions (Signed)
Physical therapy has been ordered for you at . They should call you to schedule, [phone] is the phone number to call, if you want to call to schedule.   

## 2022-05-23 ENCOUNTER — Encounter (HOSPITAL_COMMUNITY): Payer: Self-pay | Admitting: Physical Therapy

## 2022-05-23 ENCOUNTER — Ambulatory Visit (HOSPITAL_COMMUNITY): Payer: 59 | Attending: Orthopedic Surgery | Admitting: Physical Therapy

## 2022-05-23 DIAGNOSIS — M542 Cervicalgia: Secondary | ICD-10-CM | POA: Insufficient documentation

## 2022-05-23 DIAGNOSIS — M6281 Muscle weakness (generalized): Secondary | ICD-10-CM | POA: Insufficient documentation

## 2022-05-23 DIAGNOSIS — M791 Myalgia, unspecified site: Secondary | ICD-10-CM | POA: Diagnosis not present

## 2022-05-23 DIAGNOSIS — R29898 Other symptoms and signs involving the musculoskeletal system: Secondary | ICD-10-CM | POA: Insufficient documentation

## 2022-05-23 NOTE — Therapy (Signed)
OUTPATIENT PHYSICAL THERAPY CERVICAL EVALUATION   Patient Name: Samantha Reynolds MRN: [id number] DOB:1965-11-02, 57 y.o., female Today's Date: 05/23/2022  END OF SESSION:  PT End of Session - 05/23/22 0733     Visit Number 1    Number of Visits 12    Date for PT Re-Evaluation 07/04/22    Authorization Type [employer]    PT Start Time [id number]    PT Stop Time 0812    PT Time Calculation (min) 39 min    Activity Tolerance Patient tolerated treatment well    Behavior During Therapy [facility] for tasks assessed/performed             Past Medical History:  Diagnosis Date   Anxiety    Cancer ([place]) 2013   thyroid   Endometrial polyp    Endometrial thickening on ultra sound    GERD (gastroesophageal reflux disease)    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   Worse in her 65s when she was ill   H/O Hashimoto thyroiditis    History of chemotherapy    History of radiation therapy    History of thyroid cancer no recurrence   2013--  s/p  left lobe thyroidectomy--  follicular varient papillary / lymphocyctic thyroiditis   Hyperlipidemia 11/09/2015   Hypothyroidism    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/2018   Past Surgical History:  Procedure Laterality Date   BIOPSY  02/19/2018   Procedure: BIOPSY;  Surgeon: Rogene Houston, MD;  [facility];  Service: Endoscopy;;  rectum   COLONOSCOPY N/A 08/20/2015   Procedure: COLONOSCOPY;  Surgeon: Rogene Houston, MD;  [facility];  Service: Endoscopy;  Laterality: N/A;  730   COLONOSCOPY WITH PROPOFOL N/A 07/21/2021   Procedure: COLONOSCOPY WITH PROPOFOL;  Surgeon: Rogene Houston, MD;  [facility];  Service: Endoscopy;  Laterality: N/A;  10:10   D & C HYSTEROOSCOPY W/ THERMACHOICE ENDOMETRIAL ABLATION  08-13-2004   DIVERTING ILEOSTOMY N/A 12/07/2018   Procedure: DIVERTING LOOP ILEOSTOMY;  Surgeon: Leighton Ruff, MD;  [facility]: [facility];  Service: General;  Laterality: N/A;   FLEXIBLE SIGMOIDOSCOPY N/A 02/19/2018    Procedure: FLEXIBLE SIGMOIDOSCOPY;  Surgeon: Rogene Houston, MD;  [facility];  Service: Endoscopy;  Laterality: N/A;   HYSTEROSCOPY WITH D & C N/A 04/30/2015   Procedure: DILATATION AND CURETTAGE / INTENDED HYSTEROSCOPY;  Surgeon: Dian Queen, MD;  Location: [facility];  Service: Gynecology;  Laterality: N/A;   ILEOSTOMY CLOSURE N/A 04/17/2019   Procedure: LOOP ILEOSTOMY REVERSAL;  Surgeon: Leighton Ruff, MD;  [facility];  Service: General;  Laterality: N/A;   IR US GUIDE BX ASP/DRAIN  03/09/2018   LAPAROSCOPIC CHOLECYSTECTOMY  04/1996   LAPAROSCOPY N/A 12/11/2018   Procedure: LAPAROSCOPY  ILEOSTOMY REVISION AND ABDOMINAL WASHOUT;  Surgeon: Leighton Ruff, MD;  [facility];  Service: General;  Laterality: N/A;   Liver Microwave   10/17/2018   POLYPECTOMY  08/20/2015   Procedure: POLYPECTOMY;  Surgeon: Rogene Houston, MD;  [facility];  Service: Endoscopy;;  Splenic flexure polypectomy   POLYPECTOMY  02/19/2018   Procedure: POLYPECTOMY;  Surgeon: Rogene Houston, MD;  [facility];  Service: Endoscopy;;  rectum   PORTACATH PLACEMENT N/A 03/14/2018   Procedure: INSERTION PORT-A-CATH;  Surgeon: Aviva Signs, MD;  Location: [facility];  Service: General;  Laterality: N/A;   REDUCTION INCARCERATED UTERUS  06-20-2000   intrauterine preg. 13 wks /  urinary retention  THYROID LOBECTOMY  11/24/2011   Procedure: THYROID LOBECTOMY;  Surgeon: Earnstine Regal, MD;  [facility];  Service: General;  Laterality: Left;  Left Thyroid Lobectomy   TUBAL LIGATION  2002   XI ROBOTIC ASSISTED LOWER ANTERIOR RESECTION N/A 12/07/2018   Procedure: XI ROBOTIC ASSISTED LOWER ANTERIOR RESECTION, SPENIC FLEXURE IMMOBILIZATION, COLOANAL ANASTOMOSIS, RIGID PROCTOSCOPY;  Surgeon: Leighton Ruff, MD;  [facility];  Service: General;  Laterality: N/A;   Patient Active Problem List   Diagnosis Date Noted   Thrombocytopenia, unspecified ([place]) 03/31/2022    Insomnia 09/27/2021   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   H/O Hashimoto thyroiditis    Hypothyroidism    Rectal cancer metastasized to liver ([place]) 10/04/2018   Hyperbilirubinemia 04/24/2018   Abnormal EKG 03/28/2018   Severe sepsis ([place]) 03/28/2018   Sepsis due to undetermined organism ([place]) 03/27/2018   GERD (gastroesophageal reflux disease) 03/27/2018   Anxiety 03/27/2018   Lactic acidosis 03/27/2018   Hypokalemia 03/27/2018   Antineoplastic chemotherapy induced pancytopenia ([place]) 03/27/2018   Hyperglycemia 03/27/2018   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[place]) 02/26/2018   Hematochezia 01/24/2018   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   Hyperlipidemia 11/09/2015   Sciatica of right side 06/26/2012   History of thyroid cancer 04/02/2012   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   Pain in joint, ankle and foot 03/09/2011    PCP: Sallee Lange MD  REFERRING PROVIDER: Carole Civil, MD  REFERRING DIAG: M54.2 (ICD-10-CM) - Neck pain M79.10 (ICD-10-CM) - Trigger point of right side of body  THERAPY DIAG:  Cervicalgia  Muscle weakness (generalized)  Other symptoms and signs involving the musculoskeletal system  Rationale for Evaluation and Treatment: Rehabilitation  ONSET DATE: a little over a month  SUBJECTIVE:                                                                                                                                                                                                         SUBJECTIVE STATEMENT: Patient states R sided neck/ periscapular pain with pain into R arm to triceps. She had a shot near scapula which helped a little. Some tingling into thumb and index and middle finger. Massage to region helped for a few days. The last couple of weeks it has been pretty constant. Massage now only helping for a couple of hours. Thinks it has something to do with being sedentary with cancer.    PERTINENT HISTORY:  HLD, hx cancer, hx frozen shoulder  PAIN:  Are you having pain? Yes: NPRS  scale: 5/10 Pain location: R neck/shoulder Pain description: tingling, burning, stabbing, sharp Aggravating factors: sitting, computer work with mouse Relieving factors: movement  PRECAUTIONS: None  WEIGHT BEARING RESTRICTIONS: No  FALLS:  Has patient fallen in last 6 months? No  OCCUPATION: scheduling for [employer]  PLOF: Independent  PATIENT GOALS: pain relief  NEXT MD VISIT: April  OBJECTIVE:   PATIENT SURVEYS: FOTO 66% function  COGNITION: Overall cognitive status: Within functional limits for tasks assessed  SENSATION: decreased R C5  POSTURE: rounded shoulders and forward head  PALPATION: TTP R levator scapulae, UT; hyperactive and tender cervical paraspinals and suboccipitals R>L; hypomobile R and L UPA    CERVICAL ROM:   Active ROM A/PROM  eval  Flexion 0% limited  Extension 25% limited *  Right lateral flexion 75% limited *  Left lateral flexion 50% limited *  Right rotation 25% limited  Left rotation 25% limited *   (Blank rows = not tested)*=pain  UPPER EXTREMITY ROM: WFL for tasks assessed  Active ROM Right eval Left eval  Shoulder flexion    Shoulder extension    Shoulder abduction    Shoulder adduction    Shoulder extension    Shoulder internal rotation    Shoulder external rotation    Elbow flexion    Elbow extension    Wrist flexion    Wrist extension    Wrist ulnar deviation    Wrist radial deviation    Wrist pronation    Wrist supination     (Blank rows = not tested)  UPPER EXTREMITY MMT:  MMT Right eval Left eval  Shoulder flexion 4+ 4+  Shoulder extension    Shoulder abduction 4+ 5  Shoulder adduction    Shoulder extension    Shoulder internal rotation 5 5  Shoulder external rotation 4+ 4+  Middle trapezius    Lower trapezius    Elbow flexion 5 5  Elbow extension 5 5  Wrist flexion 5 5  Wrist extension 4 5   Wrist ulnar deviation    Wrist radial deviation    Wrist pronation    Wrist supination    Grip strength decreased WFL   (Blank rows = not tested)  CERVICAL SPECIAL TESTS:  Distraction test: Positive   TODAY'S TREATMENT:                                                                                                                              DATE:  05/23/22 Supine cervical retractions 2 x 10 Supine scapular retractions 2x 10 Seated scapular retractions 2 x 10    PATIENT EDUCATION:  Education details: Patient educated on exam findings, POC, scope of PT, HEP, and posture. Person educated: Patient Education method: Explanation, Demonstration, and Handouts Education comprehension: verbalized understanding, returned demonstration, verbal cues required, and tactile cues required  HOME EXERCISE PROGRAM: Access Code: [email or web address] URL: [email or web address]  Date: 05/23/2022 - Supine Chin Tuck  - 3 x daily - 7  x weekly - 2 sets - 10 reps - 2 second hold - Supine Scapular Retraction  - 3 x daily - 7 x weekly - 2 sets - 10 reps - Seated Scapular Retraction  - 3 x daily - 7 x weekly - 2 sets - 10 reps  ASSESSMENT:  CLINICAL IMPRESSION: Patient a 57 y.o. y.o. female who was seen today for physical therapy evaluation and treatment for neck pain and trigger point of R side of body. Patient presents with pain limited deficits in cervical spine and R UE strength, ROM, endurance, activity tolerance, and functional mobility with ADL. Patient is having to modify and restrict ADL as indicated by outcome measure score as well as subjective information and objective measures which is affecting overall participation. Patient will benefit from skilled physical therapy in order to improve function and reduce impairment.  OBJECTIVE IMPAIRMENTS: decreased activity tolerance, decreased endurance, decreased mobility, decreased ROM, decreased strength, hypomobility, increased muscle spasms,  impaired flexibility, improper body mechanics, postural dysfunction, and pain.   ACTIVITY LIMITATIONS: carrying, lifting, bending, reach over head, and caring for others  PARTICIPATION LIMITATIONS: meal prep, cleaning, laundry, driving, shopping, community activity, occupation, and yard work  PERSONAL FACTORS: 3+ comorbidities: HLD, hx cancer, hx frozen shoulder  are also affecting patient's functional outcome.   REHAB POTENTIAL: Good  CLINICAL DECISION MAKING: Stable/uncomplicated  EVALUATION COMPLEXITY: Low   GOALS: Goals reviewed with patient? Yes  SHORT TERM GOALS: Target date: 06/13/2022    Patient will be independent with HEP in order to improve functional outcomes. Baseline: Goal status: INITIAL  2.  Patient will report at least 25% improvement in symptoms for improved quality of life. Baseline:  Goal status: INITIAL    LONG TERM GOALS: Target date: 07/04/2022   Patient will report at least 75% improvement in symptoms for improved quality of life. Baseline:  Goal status: INITIAL  2.  Patient will improve FOTO score by at least 7 points in order to indicate improved tolerance to activity. Baseline: 66% function Goal status: INITIAL  3.  Patient will demonstrate at least 25% improvement in cervical ROM in all restricted planes for improved ability to move head while working and with chores. Baseline: see above Goal status: INITIAL  4.  Patient will be able to return to all activities unrestricted for improved ability to perform work functions and participate with family.  Baseline:  Goal status: INITIAL  5.  Patient will demonstrate grade of 5/5 MMT grade in all tested musculature as evidence of improved strength to assist with lifting.  Baseline: see above Goal status: INITIAL  PLAN:  PT FREQUENCY: 2x/week  PT DURATION: 6 weeks  PLANNED INTERVENTIONS: Therapeutic exercises, Therapeutic activity, Neuromuscular re-education, Balance training, Gait training,  Patient/Family education, Joint manipulation, Joint mobilization, Stair training, Orthotic/Fit training, DME instructions, Aquatic Therapy, Dry Needling, Electrical stimulation, Spinal manipulation, Spinal mobilization, Cryotherapy, Moist heat, Compression bandaging, scar mobilization, Splintting, Taping, Traction, Ultrasound, Ionotophoresis '4mg'$ /ml Dexamethasone, and Manual therapy  PLAN FOR NEXT SESSION: f/u with HEP, possibly DN, manual if indicated trial nerve glides, postural and shoulder strength   Vianne Bulls Cephas Revard, PT 05/23/2022, 8:16 AM

## 2022-05-23 NOTE — Patient Instructions (Signed)

## 2022-05-24 ENCOUNTER — Other Ambulatory Visit: Payer: Self-pay

## 2022-05-24 ENCOUNTER — Other Ambulatory Visit (HOSPITAL_COMMUNITY): Payer: Self-pay

## 2022-05-26 ENCOUNTER — Encounter: Payer: Self-pay | Admitting: Radiology

## 2022-05-26 ENCOUNTER — Encounter (HOSPITAL_COMMUNITY)
Admission: RE | Admit: 2022-05-26 | Discharge: 2022-05-26 | Disposition: A | Discharge status: Home / Self Care | Payer: 59 | Source: Ambulatory Visit | Attending: Hematology | Admitting: Hematology

## 2022-05-26 DIAGNOSIS — C787 Secondary malignant neoplasm of liver and intrahepatic bile duct: Secondary | ICD-10-CM | POA: Diagnosis not present

## 2022-05-26 DIAGNOSIS — C189 Malignant neoplasm of colon, unspecified: Secondary | ICD-10-CM

## 2022-05-26 DIAGNOSIS — R911 Solitary pulmonary nodule: Secondary | ICD-10-CM | POA: Diagnosis not present

## 2022-05-26 MED ORDER — FLUDEOXYGLUCOSE F - 18 (FDG) INJECTION
7.9500 | Freq: Once | INTRAVENOUS | Status: AC | PRN
  Administered 2022-05-26: 7.95 via INTRAVENOUS

## 2022-05-30 NOTE — Progress Notes (Signed)
[facility] [address] [place] [address]    Clinic Day:  05/31/2022  Referring physician: [name], MD  Patient Care Team: [name], MD as PCP - General (Family Medicine) Derek Jack, MD as Medical Oncologist (Medical Oncology)   ASSESSMENT & PLAN:   Assessment: 1.  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  Liver biopsy on 03/09/2018 consistent with adenocarcinoma. 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 -7 cycles of FOLFOX with vectibix completed on 06/27/2018. -XRT with Xeloda from 07/30/2018 through 09/06/2018. -Right liver lesion microwave ablation on 10/15/2018 at [facility]. -Low anterior resection and diverting ileostomy on 12/07/2018, pathology [id number], 0/6 lymph nodes positive, margins negative. -Loop ileostomy reversal on 04/17/2019. - Microwave ablation of the right hepatic lobe lesion by Dr. Maudie Mercury around 08/15/2019. -SBRT to the left lower lobe lung lesion and left liver lobe lesion on 12/13/2019 at [facility]. - Right upper lobe lesion SBRT from 08/06/2020 through 08/13/2020. - 3 left lung lesions and subcarinal lymph node IMRT from 09/21/2021 through 10/05/2021.   2.  Hyperbilirubinemia: -Total bilirubin is 5.1.  She has Gilbert's syndrome.  Baseline is between 2 and 3.   3.  Hypothyroidism: -She is on Armour Thyroid.  TSH is 3.6.   4.  Weight loss: -She had 10 pound weight loss since her last visit 3 months ago.  However she had ileostomy reversal and is adjusting to new food habits.  Plan: 1.  Stage IV rectal adenocarcinoma: -CT CAP on 05/13/2022: LUL nodule 7 mm, previously 4 mm.  Left infrahilar consolidation with irregular margins increased from prior due to radiation treatment. - CEA has gone up to 20.1 from 5 previously. - We reviewed PET scan images from 05/26/2022 which showed hypermetabolic left upper lobe nodule with interval  resolution of hypermetabolic metastasis in the left lower lobe, hilum and mediastinum.  New metastatic disease in the right adrenal gland, left hepatic lobe.  There is an uptake in the lower cervical vertebral body on the right side.  I will ask our radiologist to clarify on the cervical vertebral body lesion. - Based on these findings, I have recommended resuming systemic therapy. - I have recommended obtaining a biopsy of the left lobe of the liver lesion for NGS testing.  Since her previous test in 2019, newer biomarker directed therapy options are available in the form of Her-2, NTRK, RET and targeted treatments for BRAF V600 and K-ras G12 C should she develop new or mutations. - If there are no targetable mutations, we will have to restart her on FOLFOX based therapy.  Previously she received FOLFOX with Vectibix.  We will consider giving her FOLFOX with bevacizumab.  Because of her UG T1 A1 homozygosity, I am staying away from Irinotecan.   2.  Hyperbilirubinemia: -She has mildly elevated bilirubin from Guilbert syndrome.   3.  Normocytic anemia: -Labs on 05/11/2022 showed normal hemoglobin with ferritin of 61 and percent saturation 26.   4.  Leukopenia and thrombocytopenia: -She has mild intermittent leukopenia and thrombocytopenia from splenomegaly.  Previous nutritional deficiency workup was negative.  Orders Placed This Encounter  Procedures   CT Biopsy    Standing Status:   Future    Standing Expiration Date:   05/31/2023    Order Specific Question:   Lab orders requested (DO NOT place separate lab orders, these will be automatically ordered during procedure specimen collection):    Answer:  Surgical Pathology    Order Specific Question:   Reason for Exam (SYMPTOM  OR DIAGNOSIS REQUIRED)    Answer:   rectal cancer metastatic to liver, adrenals, lung - bx left lobe liver lesion, PET +    Order Specific Question:   Is patient pregnant?    Answer:   No    Order Specific Question:    Preferred location?    Answer:   [facility]      I,Alexis Herring,acting as a scribe for Derek Jack, MD.,have documented all relevant documentation on the behalf of Derek Jack, MD,as directed by  Derek Jack, MD while in the presence of Derek Jack, MD.   I, Derek Jack MD, have reviewed the above documentation for accuracy and completeness, and I agree with the above.   Derek Jack, MD   3/5/20244:22 PM  CHIEF COMPLAINT:   Diagnosis: metastatic rectal cancer to liver    Cancer Staging  Malignant neoplasm of rectum [facility]) Staging form: Colon and Rectum, AJCC 8th Edition - Clinical stage from 03/13/2018: Stage IVA (cT3, cN1b, cM1a) - Signed by Derek Jack, MD on 03/13/2018    Prior Therapy:  1. FOLFOX & vectibix x 7 cycles from 03/14/2018 to 06/27/2018. 2. XRT with Xeloda from 07/30/2018 to 09/06/2018. 3. Right liver lesion microwave ablation on 10/15/2018. 4. Low anterior resection and diverting ileostomy on 12/07/2018. 5. SBRT to liver 60 Gy in 5 fractions from 11/15/2019 to 12/13/2019.  Current Therapy:  surveillance   HISTORY OF PRESENT ILLNESS:   Oncology History  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[place])  02/26/2018 Initial Diagnosis   Rectal cancer ([place])   03/13/2018 Cancer Staging   Staging form: Colon and Rectum, AJCC 8th Edition - Clinical stage from 03/13/2018: Stage IVA (cT3, cN1b, cM1a) - Signed by Derek Jack, MD on 03/13/2018   03/14/2018 - 06/29/2018 Chemotherapy   The patient had palonosetron (ALOXI) injection 0.25 mg, 0.25 mg, Intravenous,  Once, 7 of 8 cycles Administration: 0.25 mg (03/14/2018), 0.25 mg (03/27/2018), 0.25 mg (04/18/2018), 0.25 mg (05/02/2018), 0.25 mg (05/16/2018), 0.25 mg (06/05/2018), 0.25 mg (06/27/2018) leucovorin 800 mg in dextrose 5 % 250 mL infusion, 772 mg, Intravenous,  Once, 7 of 8 cycles Administration: 800 mg (03/14/2018), 800 mg (03/27/2018), 800 mg  (05/02/2018), 800 mg (05/16/2018), 700 mg (04/18/2018), 800 mg (06/05/2018), 800 mg (06/27/2018) oxaliplatin (ELOXATIN) 165 mg in dextrose 5 % 500 mL chemo infusion, 85 mg/m2 = 165 mg, Intravenous,  Once, 6 of 7 cycles Dose modification: 68 mg/m2 (80 % of original dose 85 mg/m2, Cycle 4, Reason: Provider Judgment) Administration: 165 mg (03/14/2018), 165 mg (03/27/2018), 130 mg (05/02/2018), 130 mg (05/16/2018), 130 mg (06/05/2018), 130 mg (06/27/2018) panitumumab (VECTIBIX) 500 mg in sodium chloride 0.9 % 100 mL chemo infusion, 480 mg, Intravenous,  Once, 4 of 5 cycles Administration: 500 mg (04/18/2018), 480 mg (05/02/2018), 480 mg (05/16/2018), 480 mg (06/05/2018) fluorouracil (ADRUCIL) chemo injection 750 mg, 400 mg/m2 = 750 mg (100 % of original dose 400 mg/m2), Intravenous,  Once, 6 of 7 cycles Dose modification: 400 mg/m2 (original dose 400 mg/m2, Cycle 1), 400 mg/m2 (original dose 400 mg/m2, Cycle 3) Administration: 750 mg (03/14/2018), 750 mg (04/18/2018), 750 mg (05/02/2018), 750 mg (05/16/2018), 750 mg (06/05/2018), 750 mg (06/27/2018) fosaprepitant (EMEND) 150 mg, dexamethasone (DECADRON) 12 mg in sodium chloride 0.9 % 145 mL IVPB, , Intravenous,  Once, 4 of 5 cycles Administration:  (05/02/2018),  (05/16/2018),  (06/05/2018),  (06/27/2018) fluorouracil (ADRUCIL) 4,650 mg in sodium chloride 0.9 % 57  mL chemo infusion, 2,400 mg/m2 = 4,650 mg, Intravenous, 1 Day/Dose, 7 of 8 cycles Administration: 4,650 mg (03/14/2018), 4,650 mg (03/27/2018), 4,650 mg (04/18/2018), 4,650 mg (05/02/2018), 4,650 mg (05/16/2018), 4,650 mg (06/05/2018), 4,650 mg (06/27/2018)  for chemotherapy treatment.       INTERVAL HISTORY:   Jaquay is a 57 y.o. female presenting to clinic today for follow up of metastatic rectal cancer to liver. She was last seen by me on 05/17/22.  Today, she states that she is doing well overall. Her appetite level is at 75%. Her energy level is at 80%. She denies any new pains.  She reports that she is feeling her best  since a long time.  Has baseline numbness in the toes which is stable.   PAST MEDICAL HISTORY:   Past Medical History: Past Medical History:  Diagnosis Date   Anxiety    Cancer ([place]) 2013   thyroid   Endometrial polyp    Endometrial thickening on ultra sound    GERD (gastroesophageal reflux disease)    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   Worse in her 32s when she was ill   H/O Hashimoto thyroiditis    History of chemotherapy    History of radiation therapy    History of thyroid cancer no recurrence   2013--  s/p  left lobe thyroidectomy--  follicular varient papillary / lymphocyctic thyroiditis   Hyperlipidemia 11/09/2015   Hypothyroidism    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[place]) 02/26/2018    Surgical History: Past Surgical History:  Procedure Laterality Date   BIOPSY  02/19/2018   Procedure: BIOPSY;  Surgeon: Rogene Houston, MD;  [facility];  Service: Endoscopy;;  rectum   COLONOSCOPY N/A 08/20/2015   Procedure: COLONOSCOPY;  Surgeon: Rogene Houston, MD;  [facility];  Service: Endoscopy;  Laterality: N/A;  730   COLONOSCOPY WITH PROPOFOL N/A 07/21/2021   Procedure: COLONOSCOPY WITH PROPOFOL;  Surgeon: Rogene Houston, MD;  [facility];  Service: Endoscopy;  Laterality: N/A;  10:10   D & C HYSTEROOSCOPY W/ THERMACHOICE ENDOMETRIAL ABLATION  08-13-2004   DIVERTING ILEOSTOMY N/A 12/07/2018   Procedure: DIVERTING LOOP ILEOSTOMY;  Surgeon: Leighton Ruff, MD;  [facility]: [facility];  Service: General;  Laterality: N/A;   FLEXIBLE SIGMOIDOSCOPY N/A 02/19/2018   Procedure: FLEXIBLE SIGMOIDOSCOPY;  Surgeon: Rogene Houston, MD;  [facility];  Service: Endoscopy;  Laterality: N/A;   HYSTEROSCOPY WITH D & C N/A 04/30/2015   Procedure: DILATATION AND CURETTAGE / INTENDED HYSTEROSCOPY;  Surgeon: Dian Queen, MD;  Location: [facility];  Service: Gynecology;  Laterality: N/A;   ILEOSTOMY CLOSURE N/A 04/17/2019   Procedure: LOOP  ILEOSTOMY REVERSAL;  Surgeon: Leighton Ruff, MD;  [facility];  Service: General;  Laterality: N/A;   IR US GUIDE BX ASP/DRAIN  03/09/2018   LAPAROSCOPIC CHOLECYSTECTOMY  04/1996   LAPAROSCOPY N/A 12/11/2018   Procedure: LAPAROSCOPY  ILEOSTOMY REVISION AND ABDOMINAL WASHOUT;  Surgeon: Leighton Ruff, MD;  [facility];  Service: General;  Laterality: N/A;   Liver Microwave   10/17/2018   POLYPECTOMY  08/20/2015   Procedure: POLYPECTOMY;  Surgeon: Rogene Houston, MD;  [facility];  Service: Endoscopy;;  Splenic flexure polypectomy   POLYPECTOMY  02/19/2018   Procedure: POLYPECTOMY;  Surgeon: Rogene Houston, MD;  [facility];  Service: Endoscopy;;  rectum   PORTACATH PLACEMENT N/A 03/14/2018   Procedure: INSERTION PORT-A-CATH;  Surgeon: Aviva Signs, MD;  [facility];  Service: General;  Laterality: N/A;   REDUCTION INCARCERATED UTERUS  06-20-2000   intrauterine preg. 13 wks /  urinary retention   THYROID LOBECTOMY  11/24/2011   Procedure: THYROID LOBECTOMY;  Surgeon: Earnstine Regal, MD;  [facility];  Service: General;  Laterality: Left;  Left Thyroid Lobectomy   TUBAL LIGATION  2002   XI ROBOTIC ASSISTED LOWER ANTERIOR RESECTION N/A 12/07/2018   Procedure: XI ROBOTIC ASSISTED LOWER ANTERIOR RESECTION, SPENIC FLEXURE IMMOBILIZATION, COLOANAL ANASTOMOSIS, RIGID PROCTOSCOPY;  Surgeon: Leighton Ruff, MD;  [facility];  Service: General;  Laterality: N/A;    Social History: Social History   Socioeconomic History   Marital status: Married    Spouse name: Not on file   Number of children: 4   Years of education: Not on file   Highest education level: Not on file  Occupational History    Comment: Marketing executive at [employer] Use   Smoking status: Former    Packs/day: 0.25    Years: 10.00    Total pack years: 2.50    Types: Cigarettes    Quit date: 10/31/1991    Years since quitting: 30.6   Smokeless tobacco: Never  Vaping Use    Vaping Use: Never used  Substance and Sexual Activity   Alcohol use: No   Drug use: No   Sexual activity: Not Currently  Other Topics Concern   Not on file  Social History Narrative   Lives with husband [name] and 84 year old son [name]   Husband is Interior and spatial designer of [employer]   Social Determinants of Health   Financial Resource Strain: Low Risk  (02/26/2018)   Overall Financial Resource Strain (CARDIA)    Difficulty of Paying Living Expenses: Not hard at all  Food Insecurity: No Food Insecurity (02/26/2018)   Hunger Vital Sign    Worried About Running Out of Food in the Last Year: Never true    [place] in the Last Year: Never true  Transportation Needs: No Transportation Needs (02/26/2018)   PRAPARE - Hydrologist (Medical): No    Lack of Transportation (Non-Medical): No  Physical Activity: Inactive (02/26/2018)   Exercise Vital Sign    Days of Exercise per Week: 0 days    Minutes of Exercise per Session: 0 min  Stress: Stress Concern Present (02/26/2018)   [place]    Feeling of Stress : To some extent  Social Connections: Socially Integrated (02/26/2018)   Social Connection and Isolation Panel [NHANES]    Frequency of Communication with Friends and Family: More than three times a week    Frequency of Social Gatherings with Friends and Family: Twice a week    Attends Religious Services: More than 4 times per year    Active Member of [employer] or Organizations: Yes    Attends Music therapist: More than 4 times per year    Marital Status: Married  Human resources officer Violence: Not At Risk (02/26/2018)   Humiliation, Afraid, Rape, and Kick questionnaire    Fear of Current or Ex-Partner: No    Emotionally Abused: No    Physically Abused: No    Sexually Abused: No    Family History: Family History  Problem Relation Age of Onset   Hypertension Mother    Hypertension  Father    Prostate cancer Father    Cancer Maternal Aunt  spine/back   Cancer Paternal Grandfather        lung   Healthy Son    Healthy Son    Healthy Son    Healthy Son    Colon cancer Neg Hx     Current Medications:  Current Outpatient Medications:    ALPRAZolam (XANAX) 0.5 MG tablet, Take 1 tablet (0.5 mg total) by mouth 3 (three) times daily as needed for anxiety., Disp: 90 tablet, Rfl: 5   amitriptyline (ELAVIL) 10 MG tablet, Take 1 tablet (10 mg total) by mouth 2 (two) times daily., Disp: 180 tablet, Rfl: 3   ARMOUR THYROID 60 MG tablet, Take 1 tablet (60 mg total) by mouth daily., Disp: 90 tablet, Rfl: 1   ARMOUR THYROID 60 MG tablet, Take 1 tablet (60 mg total) by mouth daily., Disp: 90 tablet, Rfl: 1   loperamide (IMODIUM) 2 MG capsule, Take 1 capsule (2 mg total) by mouth 3 (three) times daily. (Patient taking differently: Take 2 mg by mouth daily.), Disp: 30 capsule, Rfl: 0   predniSONE (STERAPRED UNI-PAK 48 TAB) 10 MG (48) TBPK tablet, Take as directed, Disp: 48 tablet, Rfl: 0   pregabalin (LYRICA) 25 MG capsule, Take 1 capsule (25 mg total) by mouth 2 (two) times daily., Disp: 60 capsule, Rfl: 3   prochlorperazine (COMPAZINE) 10 MG tablet, Take 1 tablet (10 mg total) by mouth every 6 (six) hours as needed for nausea or vomiting., Disp: 60 tablet, Rfl: 3   tiZANidine (ZANAFLEX) 4 MG tablet, Take 1 tablet (4 mg total) by mouth every 6 (six) hours as needed for muscle spasms (take for tightness and pain in the scapular region)., Disp: 56 tablet, Rfl: 2 No current facility-administered medications for this visit.  Facility-Administered Medications Ordered in Other Visits:    clindamycin (CLEOCIN) 900 mg in dextrose 5 % 50 mL IVPB, 900 mg, Intravenous, 60 min Pre-Op **AND** gentamicin (GARAMYCIN) 350 mg in dextrose 5 % 50 mL IVPB, 5 mg/kg, Intravenous, 60 min Pre-Op, Leighton Ruff, MD   clindamycin (CLEOCIN) 900 mg in dextrose 5 % 50 mL IVPB, 900 mg, Intravenous, 60 min  Pre-Op **AND** gentamicin (GARAMYCIN) 5 mg/kg in dextrose 5 % 50 mL IVPB, 5 mg/kg, Intravenous, 60 min Pre-Op, Leighton Ruff, MD   sodium chloride 0.9 % 1,000 mL with potassium chloride 20 mEq, magnesium sulfate 2 g infusion, , Intravenous, Once, Derek Jack, MD   Allergies: Allergies  Allergen Reactions   Demerol [Meperidine] Itching    All over the body.   Penicillins Hives     childhood, does not remember if it spread all over the body or not. Did it involve swelling of the face/tongue/throat, SOB, or low BP? No Did it involve sudden or severe rash/hives, skin peeling, or any reaction on the inside of your mouth or nose? Yes Did you need to seek medical attention at a hospital or doctor's office? Yes When did it last happen?      childhood allergy If all above answers are "NO", may proceed with cephalosporin use.    Levaquin [Levofloxacin] Other (See Comments)    Patient has Aortic Aneurysm and is not indicated with this diagnosis   Other Hives and Other (See Comments)    Fresh coconut    Vancomycin Rash    Will need Benadryl prior to administration per IV. "Red man syndrome"    REVIEW OF SYSTEMS:   Review of Systems  Constitutional:  Negative for chills, fatigue and fever.  HENT:   Negative for  lump/mass, mouth sores, nosebleeds, sore throat and trouble swallowing.   Eyes:  Negative for eye problems.  Respiratory:  Negative for cough and shortness of breath.   Cardiovascular:  Negative for chest pain, leg swelling and palpitations.  Gastrointestinal:  Negative for abdominal pain, constipation, diarrhea, nausea and vomiting.  Genitourinary:  Negative for bladder incontinence, difficulty urinating, dysuria, frequency, hematuria and nocturia.   Musculoskeletal:  Negative for arthralgias, back pain, flank pain, myalgias and neck pain.  Skin:  Negative for itching and rash.  Neurological:  Negative for dizziness, headaches and numbness.  Hematological:  Does not  bruise/bleed easily.  Psychiatric/Behavioral:  Negative for depression, sleep disturbance and suicidal ideas. The patient is not nervous/anxious.   All other systems reviewed and are negative.    VITALS:   Blood pressure 124/81, pulse (!) 112, temperature 98.1 F (36.7 C), temperature source Oral, resp. rate 18, weight 139 lb 6.4 oz (63.2 kg), last menstrual period 02/16/2015, SpO2 100 %.  Wt Readings from Last 3 Encounters:  05/31/22 139 lb 6.4 oz (63.2 kg)  05/20/22 139 lb (63 kg)  05/17/22 139 lb 6.4 oz (63.2 kg)    Body mass index is 22.5 kg/m.  Performance status (ECOG): 0 - Asymptomatic  PHYSICAL EXAM:   Physical Exam Vitals and nursing note reviewed. Exam conducted with a chaperone present.  Constitutional:      Appearance: Normal appearance.  Cardiovascular:     Rate and Rhythm: Normal rate and regular rhythm.     Pulses: Normal pulses.     Heart sounds: Normal heart sounds.  Pulmonary:     Effort: Pulmonary effort is normal.     Breath sounds: Normal breath sounds.  Abdominal:     Palpations: Abdomen is soft. There is no hepatomegaly, splenomegaly or mass.     Tenderness: There is no abdominal tenderness.  Musculoskeletal:     Right lower leg: No edema.     Left lower leg: No edema.  Lymphadenopathy:     Cervical: No cervical adenopathy.     Right cervical: No superficial, deep or posterior cervical adenopathy.    Left cervical: No superficial, deep or posterior cervical adenopathy.     Upper Body:     Right upper body: No supraclavicular or axillary adenopathy.     Left upper body: No supraclavicular or axillary adenopathy.  Neurological:     General: No focal deficit present.     Mental Status: She is alert and oriented to person, place, and time.  Psychiatric:        Mood and Affect: Mood normal.        Behavior: Behavior normal.     LABS:      Latest Ref Rng & Units 05/11/2022    2:00 PM 01/04/2022    1:52 PM 08/02/2021    2:04 PM  CBC  WBC 4.0  - 10.5 K/uL 3.1  3.0  3.2   Hemoglobin 12.0 - 15.0 g/dL 12.3  10.4  12.2   Hematocrit 36.0 - 46.0 % 35.3  29.8  34.9   Platelets 150 - 400 K/uL 117  108  109       Latest Ref Rng & Units 05/11/2022    2:00 PM 01/04/2022    1:52 PM 08/02/2021    2:04 PM  CMP  Glucose 70 - 99 mg/dL 95  95  97   BUN 6 - 20 mg/dL '17  16  14   '$ Creatinine 0.44 - 1.00 mg/dL 0.91  0.78  0.93   Sodium 135 - 145 mmol/L 135  138  136   Potassium 3.5 - 5.1 mmol/L 3.4  3.9  3.5   Chloride 98 - 111 mmol/L 100  106  103   CO2 22 - 32 mmol/L '25  26  26   '$ Calcium 8.9 - 10.3 mg/dL 8.6  8.5  8.9   Total Protein 6.5 - 8.1 g/dL 7.3  6.8  7.7   Total Bilirubin 0.3 - 1.2 mg/dL 2.5  2.2  2.1   Alkaline Phos 38 - 126 U/L 131  100  78   AST 15 - 41 U/L '21  19  21   '$ ALT 0 - 44 U/L '12  11  14      '$ Lab Results  Component Value Date   CEA1 20.1 (H) 05/11/2022   /  CEA  Date Value Ref Range Status  05/11/2022 20.1 (H) 0.0 - 4.7 ng/mL Final    Comment:    (NOTE)                             Nonsmokers          <3.9                             Smokers             <5.6 Roche [employer]ostics Electrochemiluminescence Immunoassay (ECLIA) Values obtained with different assay methods or kits cannot be used interchangeably.  Results cannot be interpreted as absolute evidence of the presence or absence of malignant disease. Performed At: [facility] [place], [place] [id number] Rush Farmer MD [id number]    No results found for: "PSA1" No results found for: "CAN199" No results found for: "CAN125"  No results found for: "TOTALPROTELP", "ALBUMINELP", "A1GS", "A2GS", "BETS", "BETA2SER", "GAMS", "MSPIKE", "SPEI" Lab Results  Component Value Date   TIBC 364 05/11/2022   TIBC 385 11/09/2020   FERRITIN 61 05/11/2022   FERRITIN 54 11/09/2020   IRONPCTSAT 26 05/11/2022   IRONPCTSAT 22 11/09/2020   Lab Results  Component Value Date   LDH 154 01/20/2020     STUDIES:   NM PET Image Restag (PS)  Skull Base To Thigh  Result Date: 05/30/2022 CLINICAL DATA:  Subsequent treatment strategy for metastatic colorectal carcinoma. EXAM: NUCLEAR MEDICINE PET SKULL BASE TO THIGH TECHNIQUE: 7.9 mCi F-18 FDG was injected intravenously. Full-ring PET imaging was performed from the skull base to thigh after the radiotracer. CT data was obtained and used for attenuation correction and anatomic localization. Fasting blood glucose: 101 mg/dl COMPARISON:  CT 05/13/2022, PET-CT 08/19/2021 FINDINGS: Mediastinal blood pool activity: SUV max 2.8 Liver activity: SUV max NA NECK: No hypermetabolic lymph nodes in the neck. Incidental CT findings: None. CHEST: The nodule of concern along superior LEFT LEFT fissure does have metabolic activity which is mild (SUV max 2.9) but significant for size (7 mm, image 87/3) Resolution of previously seen hypermetabolic LEFT infrahilar nodule and LEFT lobe nodules. No residual metabolic activity LEFT lower lobe suggest residual carcinoma. Resolution of previously seen hypermetabolic subcarinal node. RIGHT upper lobe nodule mixed density nodule measuring 1.9 cm is relatively low metabolic activity (SUV max equal 2.7). Recommend attention on follow-up. Incidental CT findings: None ABDOMEN/PELVIS: Interval enlargement of the RIGHT adrenal gland to 2.0 x 3.0 cm with intense metabolic activity (SUV max equal 7.9). Intense metabolic activity in the lateral segment  of the LEFT hepatic lobe with SUV max equal 7.9 on image 152. Difficult to define lesion on noncontrast CT. On comparison PET-CT scan there is evidence of metastatic disease in the LEFT hepatic lobe although the lesion lesion appeared smaller at that time. Incidental CT findings: Postcholecystectomy.  Uterus normal. SKELETON: No focal hypermetabolic activity to suggest skeletal metastasis. Incidental CT findings: None. IMPRESSION: 1. Hypermetabolic LEFT upper lobe nodule along the fissures concerning for pulmonary metastasis. 2. Interval  resolution of hypermetabolic metastasis in the LEFT lower lobe, hilum, and mediastinum. 3. Unfortunately there is evidence of NEW METASTATIC DISEASE to the RIGHT adrenal gland and recurrent metastatic malignancy in the LEFT hepatic lobe of the liver. Electronically Signed   By: Suzy Bouchard M.D.   On: 05/30/2022 16:38   DG Shoulder Right  Result Date: 05/23/2022 Right shoulder Right shoulder pain but pain is actually in the medial border the scapula Mild loss of congruency between the inferior femoral neck and inferior scapula.  Glenohumeral joint otherwise normal AC joint looks to have mild degenerative changes which include mild joint space narrowing and osteophyte formation Impression 1 break in congruency between the inferior humerus and scapula consistent with potential cuff disease 2 AC joint arthritis   CT CHEST ABDOMEN PELVIS W CONTRAST  Result Date: 05/13/2022 CLINICAL DATA:  Colorectal carcinoma. CEA tumor marker elevated. Insert by contrast CT EXAM: CT CHEST, ABDOMEN, AND PELVIS WITH CONTRAST TECHNIQUE: Multidetector CT imaging of the chest, abdomen and pelvis was performed following the standard protocol during bolus administration of intravenous contrast. RADIATION DOSE REDUCTION: This exam was performed according to the departmental dose-optimization program which includes automated exposure control, adjustment of the mA and/or kV according to patient size and/or use of iterative reconstruction technique. CONTRAST:  121m OMNIPAQUE IOHEXOL 300 MG/ML  SOLN COMPARISON:  None Available. FINDINGS: CT CHEST FINDINGS Cardiovascular: Ascending thoracic aorta measures 40 mm in diameter (image 101/sagittal series 5) Port in the anterior chest wall with tip in distal SVC. Mediastinum/Nodes: No axillary or supraclavicular adenopathy. No mediastinal or hilar adenopathy. No pericardial fluid. Esophagus normal. Lungs/Pleura: LEFT infrahilar consolidation with irregular margins (image 79/4) is increased  from prior. Prior radiation treatment. Medial RIGHT upper lobe nodularity with a rim of ground-glass opacity is increased from prior. The central solid portion measures 1.2 cm compared to 1.2 cm on prior (image 32/4) LEFT upper lobe nodule along the fissure measures 7 mm on image 56/4 compared to 4 mm. Musculoskeletal: No aggressive osseous lesion. CT ABDOMEN AND PELVIS FINDINGS Hepatobiliary: Posterior RIGHT hepatic lobe lesion measures 17 mm (63) compared to 17 mm. More inferior RIGHT hepatic lobe lesion measures 22 mm also unchanged. Postcholecystectomy. Pancreas: Pancreas is normal. No ductal dilatation. No pancreatic inflammation. Spleen: Cystic lesion in the spleen is unchanged. Adrenals/urinary tract: Adrenal glands and kidneys are normal. The ureters and bladder normal. Stomach/Bowel: The stomach, duodenum, and small bowel normal. LEFT low rectal anastomosis (image 128). No pelvic lymphadenopathy.  No mesenteric lymphadenopathy. Vascular/Lymphatic: Abdominal aorta is normal caliber. There is no retroperitoneal or periportal lymphadenopathy. No pelvic lymphadenopathy. Reproductive: Unremarkable Other: No peritoneal metastasis Musculoskeletal: No aggressive osseous lesion. IMPRESSION: CHEST IMPRESSION: 1. Increase in LEFT infrahilar consolidation with irregular margins. Differential includes post radiation change versus local recurrence. Favor post radiation change. 2. Stable RIGHT upper lobe pulmonary nodule with a rim of ground-glass opacity. Recommend continued observation. 3. Interval increase in size of solitary LEFT upper lobe pulmonary nodule along the fissure. Enlargement is concerning for metastatic lesion. Recommend repeat PET-CT  scan. 4. No mediastinal adenopathy. 5. Stable 4 cm ascending thoracic aorta. Recommend annual imaging followup by CTA or MRA. This recommendation follows 2010 ACCF/AHA/AATS/ACR/ASA/SCA/SCAI/SIR/STS/SVM Guidelines for the Diagnosis and Management of Patients with Thoracic  Aortic Disease. Circulation. 2010; [id number]. Aortic aneurysm NOS (ICD10-I71.9) PELVIS IMPRESSION: 1. Stable treated hepatic metastasis. 2. No evidence of metastatic disease in the abdomen pelvis. 3. No evidence of local recurrence at the LEFT low rectal anastomosis. Electronically Signed   By: Suzy Bouchard M.D.   On: 05/13/2022 16:57

## 2022-05-31 ENCOUNTER — Inpatient Hospital Stay: Payer: 59 | Attending: Hematology | Admitting: Hematology

## 2022-05-31 ENCOUNTER — Encounter: Payer: Self-pay | Admitting: Hematology

## 2022-05-31 VITALS — BP 124/81 | HR 112 | Temp 98.1°F | Resp 18 | Wt 139.4 lb

## 2022-05-31 DIAGNOSIS — D649 Anemia, unspecified: Secondary | ICD-10-CM | POA: Insufficient documentation

## 2022-05-31 DIAGNOSIS — R634 Abnormal weight loss: Secondary | ICD-10-CM | POA: Insufficient documentation

## 2022-05-31 DIAGNOSIS — D72819 Decreased white blood cell count, unspecified: Secondary | ICD-10-CM | POA: Diagnosis not present

## 2022-05-31 DIAGNOSIS — C787 Secondary malignant neoplasm of liver and intrahepatic bile duct: Secondary | ICD-10-CM | POA: Insufficient documentation

## 2022-05-31 DIAGNOSIS — Z85850 Personal history of malignant neoplasm of thyroid: Secondary | ICD-10-CM | POA: Insufficient documentation

## 2022-05-31 DIAGNOSIS — Z79899 Other long term (current) drug therapy: Secondary | ICD-10-CM | POA: Insufficient documentation

## 2022-05-31 DIAGNOSIS — D696 Thrombocytopenia, unspecified: Secondary | ICD-10-CM | POA: Diagnosis not present

## 2022-05-31 DIAGNOSIS — C189 Malignant neoplasm of colon, unspecified: Secondary | ICD-10-CM

## 2022-05-31 DIAGNOSIS — Z9221 Personal history of antineoplastic chemotherapy: Secondary | ICD-10-CM | POA: Diagnosis not present

## 2022-05-31 DIAGNOSIS — E039 Hypothyroidism, unspecified: Secondary | ICD-10-CM | POA: Insufficient documentation

## 2022-05-31 DIAGNOSIS — Z8042 Family history of malignant neoplasm of prostate: Secondary | ICD-10-CM | POA: Diagnosis not present

## 2022-05-31 DIAGNOSIS — Z808 Family history of malignant neoplasm of other organs or systems: Secondary | ICD-10-CM | POA: Diagnosis not present

## 2022-05-31 DIAGNOSIS — Z801 Family history of malignant neoplasm of trachea, bronchus and lung: Secondary | ICD-10-CM | POA: Insufficient documentation

## 2022-05-31 DIAGNOSIS — Z923 Personal history of irradiation: Secondary | ICD-10-CM | POA: Diagnosis not present

## 2022-05-31 DIAGNOSIS — Z87891 Personal history of nicotine dependence: Secondary | ICD-10-CM | POA: Diagnosis not present

## 2022-05-31 DIAGNOSIS — C20 Malignant neoplasm of rectum: Secondary | ICD-10-CM | POA: Insufficient documentation

## 2022-05-31 DIAGNOSIS — Z8249 Family history of ischemic heart disease and other diseases of the circulatory system: Secondary | ICD-10-CM | POA: Insufficient documentation

## 2022-05-31 NOTE — Patient Instructions (Addendum)
[place] at [facility] Discharge Instructions   You were seen and examined today by Dr. Delton Coombes.  He reviewed the results of your PET scan. The spot that was seen on your lung previously is lighting up. There are also spots on your liver and adrenal gland lighting up.   Dr. Raliegh Ip recommends treatment with chemotherapy. He also recommends biopsying the area on your liver to send for special testing to potentially open up more treatment options by finding mutations to target.   We will see you back after the biopsy and the special testing results are back. We will discuss treatment options in detail at that time.   Return as scheduled.    Thank you for choosing [place] at [facility] to provide your oncology and hematology care.  To afford each patient quality time with our provider, please arrive at least 15 minutes before your scheduled appointment time.   If you have a lab appointment with the [place] please come in thru the Main Entrance and check in at the main information desk.  You need to re-schedule your appointment should you arrive 10 or more minutes late.  We strive to give you quality time with our providers, and arriving late affects you and other patients whose appointments are after yours.  Also, if you no show three or more times for appointments you may be dismissed from the clinic at the providers discretion.     Again, thank you for choosing [facility].  Our hope is that these requests will decrease the amount of time that you wait before being seen by our physicians.       _____________________________________________________________  Should you have questions after your visit to [facility], please contact our office at [phone] and follow the prompts.  Our office hours are 8:00 a.m. and 4:30 p.m. Monday - Friday.  Please note that voicemails left after 4:00 p.m. may not be returned  until the following business day.  We are closed weekends and major holidays.  You do have access to a nurse 24-7, just call the main number to the clinic [phone] and do not press any options, hold on the line and a nurse will answer the phone.    For prescription refill requests, have your pharmacy contact our office and allow 72 hours.    Due to Covid, you will need to wear a mask upon entering the hospital. If you do not have a mask, a mask will be given to you at the Main Entrance upon arrival. For doctor visits, patients may have 1 support person age 93 or older with them. For treatment visits, patients can not have anyone with them due to social distancing guidelines and our immunocompromised population.

## 2022-06-01 ENCOUNTER — Encounter: Payer: Self-pay | Admitting: Family Medicine

## 2022-06-01 ENCOUNTER — Encounter: Payer: Self-pay | Admitting: General Practice

## 2022-06-01 NOTE — Progress Notes (Signed)
Samantha Cleveland, MD  Allen Kell, NT PROCEDURE / BIOPSY REVIEW Date: 06/01/22  Requested Biopsy site: R adrenal Reason for request: new met Imaging review: Best seen on PET 05/26/22 Im 158 Se 3  Decision: Approved Imaging modality to perform: CT Schedule with: Moderate Sedation Schedule for: Any VIR  Additional comments: '@VIR'$ : Prone R paraspinal approach  Please contact me with questions, concerns, or if issue pertaining to this request arise.  Dayne Samantha Cleveland, MD [facility] Radiology  Pager. [phone] Clinic. 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

## 2022-06-07 ENCOUNTER — Encounter (HOSPITAL_COMMUNITY): Payer: Self-pay | Admitting: Physical Therapy

## 2022-06-07 ENCOUNTER — Ambulatory Visit (HOSPITAL_COMMUNITY): Payer: 59 | Attending: Orthopedic Surgery | Admitting: Physical Therapy

## 2022-06-07 DIAGNOSIS — R29898 Other symptoms and signs involving the musculoskeletal system: Secondary | ICD-10-CM | POA: Diagnosis not present

## 2022-06-07 DIAGNOSIS — M542 Cervicalgia: Secondary | ICD-10-CM | POA: Diagnosis not present

## 2022-06-07 DIAGNOSIS — M6281 Muscle weakness (generalized): Secondary | ICD-10-CM | POA: Insufficient documentation

## 2022-06-07 NOTE — Therapy (Signed)
OUTPATIENT PHYSICAL THERAPY TREATMENT   Patient Name: Samantha Reynolds MRN: [id number] DOB:Mar 02, 1966, 57 y.o., female Today's Date: 06/07/2022  END OF SESSION:  PT End of Session - 06/07/22 0731     Visit Number 2    Number of Visits 12    Date for PT Re-Evaluation 07/04/22    Authorization Type [employer]    PT Start Time [id number]    PT Stop Time 0817    PT Time Calculation (min) 46 min    Activity Tolerance Patient tolerated treatment well    Behavior During Therapy [facility] for tasks assessed/performed             Past Medical History:  Diagnosis Date   Anxiety    Cancer ([place]) 2013   thyroid   Endometrial polyp    Endometrial thickening on ultra sound    GERD (gastroesophageal reflux disease)    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   Worse in her 29s when she was ill   H/O Hashimoto thyroiditis    History of chemotherapy    History of radiation therapy    History of thyroid cancer no recurrence   2013--  s/p  left lobe thyroidectomy--  follicular varient papillary / lymphocyctic thyroiditis   Hyperlipidemia 11/09/2015   Hypothyroidism    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   Past Surgical History:  Procedure Laterality Date   BIOPSY  02/19/2018   Procedure: BIOPSY;  Surgeon: Rogene Houston, MD;  [facility];  Service: Endoscopy;;  rectum   COLONOSCOPY N/A 08/20/2015   Procedure: COLONOSCOPY;  Surgeon: Rogene Houston, MD;  [facility];  Service: Endoscopy;  Laterality: N/A;  730   COLONOSCOPY WITH PROPOFOL N/A 07/21/2021   Procedure: COLONOSCOPY WITH PROPOFOL;  Surgeon: Rogene Houston, MD;  [facility];  Service: Endoscopy;  Laterality: N/A;  10:10   D & C HYSTEROOSCOPY W/ THERMACHOICE ENDOMETRIAL ABLATION  08-13-2004   DIVERTING ILEOSTOMY N/A 12/07/2018   Procedure: DIVERTING LOOP ILEOSTOMY;  Surgeon: Leighton Ruff, MD;  [facility]: [facility];  Service: General;  Laterality: N/A;   FLEXIBLE SIGMOIDOSCOPY N/A 02/19/2018    Procedure: FLEXIBLE SIGMOIDOSCOPY;  Surgeon: Rogene Houston, MD;  [facility];  Service: Endoscopy;  Laterality: N/A;   HYSTEROSCOPY WITH D & C N/A 04/30/2015   Procedure: DILATATION AND CURETTAGE / INTENDED HYSTEROSCOPY;  Surgeon: Dian Queen, MD;  Location: [place];  Service: Gynecology;  Laterality: N/A;   ILEOSTOMY CLOSURE N/A 04/17/2019   Procedure: LOOP ILEOSTOMY REVERSAL;  Surgeon: Leighton Ruff, MD;  [facility];  Service: General;  Laterality: N/A;   IR US GUIDE BX ASP/DRAIN  03/09/2018   LAPAROSCOPIC CHOLECYSTECTOMY  04/1996   LAPAROSCOPY N/A 12/11/2018   Procedure: LAPAROSCOPY  ILEOSTOMY REVISION AND ABDOMINAL WASHOUT;  Surgeon: Leighton Ruff, MD;  [facility];  Service: General;  Laterality: N/A;   Liver Microwave   10/17/2018   POLYPECTOMY  08/20/2015   Procedure: POLYPECTOMY;  Surgeon: Rogene Houston, MD;  [facility];  Service: Endoscopy;;  Splenic flexure polypectomy   POLYPECTOMY  02/19/2018   Procedure: POLYPECTOMY;  Surgeon: Rogene Houston, MD;  [facility];  Service: Endoscopy;;  rectum   PORTACATH PLACEMENT N/A 03/14/2018   Procedure: INSERTION PORT-A-CATH;  Surgeon: Aviva Signs, MD;  Location: [facility];  Service: General;  Laterality: N/A;   REDUCTION INCARCERATED UTERUS  06-20-2000   intrauterine preg. 13 wks /  urinary retention  THYROID LOBECTOMY  11/24/2011   Procedure: THYROID LOBECTOMY;  Surgeon: Earnstine Regal, MD;  [facility];  Service: General;  Laterality: Left;  Left Thyroid Lobectomy   TUBAL LIGATION  2002   XI ROBOTIC ASSISTED LOWER ANTERIOR RESECTION N/A 12/07/2018   Procedure: XI ROBOTIC ASSISTED LOWER ANTERIOR RESECTION, SPENIC FLEXURE IMMOBILIZATION, COLOANAL ANASTOMOSIS, RIGID PROCTOSCOPY;  Surgeon: Leighton Ruff, MD;  [facility];  Service: General;  Laterality: N/A;   Patient Active Problem List   Diagnosis Date Noted   Thrombocytopenia, unspecified ([place]) 03/31/2022    Insomnia 09/27/2021   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   H/O Hashimoto thyroiditis    Hypothyroidism    Rectal cancer metastasized to liver ([place]) 10/04/2018   Hyperbilirubinemia 04/24/2018   Abnormal EKG 03/28/2018   Severe sepsis ([place]) 03/28/2018   Sepsis due to undetermined organism ([place]) 03/27/2018   GERD (gastroesophageal reflux disease) 03/27/2018   Anxiety 03/27/2018   Lactic acidosis 03/27/2018   Hypokalemia 03/27/2018   Antineoplastic chemotherapy induced pancytopenia ([place]) 03/27/2018   Hyperglycemia 03/27/2018   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[place]) 02/26/2018   Hematochezia 01/24/2018   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   Hyperlipidemia 11/09/2015   Sciatica of right side 06/26/2012   History of thyroid cancer 04/02/2012   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   Pain in joint, ankle and foot 03/09/2011    PCP: Sallee Lange MD  REFERRING PROVIDER: Carole Civil, MD  REFERRING DIAG: M54.2 (ICD-10-CM) - Neck pain M79.10 (ICD-10-CM) - Trigger point of right side of body  THERAPY DIAG:  Cervicalgia  Muscle weakness (generalized)  Other symptoms and signs involving the musculoskeletal system  Rationale for Evaluation and Treatment: Rehabilitation  ONSET DATE: a little over a month  SUBJECTIVE:                                                                                                                                                                                                         SUBJECTIVE STATEMENT: Patient states neck/ shoulder is not feeling better. Steroids helped symptoms but caused constipation. Found out she has to go back on chemo. HEP is going well.   EVAL: Patient states R sided neck/ periscapular pain with pain into R arm to triceps. She had a shot near scapula which helped a little. Some tingling into thumb and index and middle finger. Massage to region helped for a few days. The last  couple of weeks it has been pretty constant. Massage now only helping for a couple of hours. Thinks  it has something to do with being sedentary with cancer.   PERTINENT HISTORY:  HLD, hx cancer, hx frozen shoulder  PAIN:  Are you having pain? Yes: NPRS scale: 7/10 Pain location: R neck/shoulder Pain description: tingling, burning, stabbing, sharp Aggravating factors: sitting, computer work with mouse Relieving factors: movement  PRECAUTIONS: None  WEIGHT BEARING RESTRICTIONS: No  FALLS:  Has patient fallen in last 6 months? No  OCCUPATION: scheduling for [employer]  PLOF: Independent  PATIENT GOALS: pain relief  NEXT MD VISIT: April  OBJECTIVE:   PATIENT SURVEYS: FOTO 66% function  COGNITION: Overall cognitive status: Within functional limits for tasks assessed  SENSATION: decreased R C5  POSTURE: rounded shoulders and forward head  PALPATION: TTP R levator scapulae, UT; hyperactive and tender cervical paraspinals and suboccipitals R>L; hypomobile R and L UPA    CERVICAL ROM:   Active ROM A/PROM  eval  Flexion 0% limited  Extension 25% limited *  Right lateral flexion 75% limited *  Left lateral flexion 50% limited *  Right rotation 25% limited  Left rotation 25% limited *   (Blank rows = not tested)*=pain  UPPER EXTREMITY ROM: WFL for tasks assessed  Active ROM Right eval Left eval  Shoulder flexion    Shoulder extension    Shoulder abduction    Shoulder adduction    Shoulder extension    Shoulder internal rotation    Shoulder external rotation    Elbow flexion    Elbow extension    Wrist flexion    Wrist extension    Wrist ulnar deviation    Wrist radial deviation    Wrist pronation    Wrist supination     (Blank rows = not tested)  UPPER EXTREMITY MMT:  MMT Right eval Left eval  Shoulder flexion 4+ 4+  Shoulder extension    Shoulder abduction 4+ 5  Shoulder adduction    Shoulder extension    Shoulder internal rotation 5 5   Shoulder external rotation 4+ 4+  Middle trapezius    Lower trapezius    Elbow flexion 5 5  Elbow extension 5 5  Wrist flexion 5 5  Wrist extension 4 5  Wrist ulnar deviation    Wrist radial deviation    Wrist pronation    Wrist supination    Grip strength decreased WFL   (Blank rows = not tested)  CERVICAL SPECIAL TESTS:  Distraction test: Positive   TODAY'S TREATMENT:                                                                                                                              DATE:  06/07/22 Manual: STM to R UT pre and post dry needling for trigger point identification and muscular relaxation.  Trigger Point Dry-Needling  Treatment instructions: Expect mild to moderate muscle soreness. S/S of pneumothorax if dry needled over a lung field, and to seek immediate medical attention should they occur. Patient verbalized understanding  of these instructions and education.  Patient Consent Given: Yes Education handout provided: Previously provided Muscles treated: R UT in prone and supine Electrical stimulation performed: No Parameters: N/A Treatment response/outcome: twitch response elicited, decrease in symptoms   Supine cervical retractions 2 x 10 Supine scapular retractions 2x 10 Seated scapular retractions 2 x 10   Seated cervical retractions 2 x 10  Seated UT stretch 2 x 30 second holds for R  05/23/22 Supine cervical retractions 2 x 10 Supine scapular retractions 2x 10 Seated scapular retractions 2 x 10    PATIENT EDUCATION:  Education details: 06/07/22: dry needling, HEP;  EVAL: Patient educated on exam findings, POC, scope of PT, HEP, and posture. Person educated: Patient Education method: Explanation, Demonstration, and Handouts Education comprehension: verbalized understanding, returned demonstration, verbal cues required, and tactile cues required  HOME EXERCISE PROGRAM: Access Code: [email or web address] URL: [email or web address] 06/07/22 -  Seated Cervical Retraction  - 3 x daily - 7 x weekly - 3 sets - 10 reps - Seated Gentle Upper Trapezius Stretch  - 3 x daily - 7 x weekly - 3 reps - 20-30 second hold  Date: 05/23/2022 - Supine Chin Tuck  - 3 x daily - 7 x weekly - 2 sets - 10 reps - 2 second hold - Supine Scapular Retraction  - 3 x daily - 7 x weekly - 2 sets - 10 reps - Seated Scapular Retraction  - 3 x daily - 7 x weekly - 2 sets - 10 reps  ASSESSMENT:  CLINICAL IMPRESSION: Performed DN to R UT in prone with slight decrease in tissue tension. Completed again in supine with multiple twitch responses and significant decrease in tissue tension with decrease in symptoms following. Performed previously completed exercises in which patient performs with good mechanics without cueing. Some cuing required for seated cervical retractions and posture throughout session. Manual assist initially provided for seated UT stretch with fair carry over. Patient will continue to benefit from physical therapy in order to improve function and reduce impairment.   OBJECTIVE IMPAIRMENTS: decreased activity tolerance, decreased endurance, decreased mobility, decreased ROM, decreased strength, hypomobility, increased muscle spasms, impaired flexibility, improper body mechanics, postural dysfunction, and pain.   ACTIVITY LIMITATIONS: carrying, lifting, bending, reach over head, and caring for others  PARTICIPATION LIMITATIONS: meal prep, cleaning, laundry, driving, shopping, community activity, occupation, and yard work  PERSONAL FACTORS: 3+ comorbidities: HLD, hx cancer, hx frozen shoulder  are also affecting patient's functional outcome.   REHAB POTENTIAL: Good  CLINICAL DECISION MAKING: Stable/uncomplicated  EVALUATION COMPLEXITY: Low   GOALS: Goals reviewed with patient? Yes  SHORT TERM GOALS: Target date: 06/13/2022    Patient will be independent with HEP in order to improve functional outcomes. Baseline: Goal status: INITIAL  2.   Patient will report at least 25% improvement in symptoms for improved quality of life. Baseline:  Goal status: INITIAL    LONG TERM GOALS: Target date: 07/04/2022   Patient will report at least 75% improvement in symptoms for improved quality of life. Baseline:  Goal status: INITIAL  2.  Patient will improve FOTO score by at least 7 points in order to indicate improved tolerance to activity. Baseline: 66% function Goal status: INITIAL  3.  Patient will demonstrate at least 25% improvement in cervical ROM in all restricted planes for improved ability to move head while working and with chores. Baseline: see above Goal status: INITIAL  4.  Patient will be able to return to all activities unrestricted for improved ability  to perform work functions and participate with family.  Baseline:  Goal status: INITIAL  5.  Patient will demonstrate grade of 5/5 MMT grade in all tested musculature as evidence of improved strength to assist with lifting.  Baseline: see above Goal status: INITIAL  PLAN:  PT FREQUENCY: 2x/week  PT DURATION: 6 weeks  PLANNED INTERVENTIONS: Therapeutic exercises, Therapeutic activity, Neuromuscular re-education, Balance training, Gait training, Patient/Family education, Joint manipulation, Joint mobilization, Stair training, Orthotic/Fit training, DME instructions, Aquatic Therapy, Dry Needling, Electrical stimulation, Spinal manipulation, Spinal mobilization, Cryotherapy, Moist heat, Compression bandaging, scar mobilization, Splintting, Taping, Traction, Ultrasound, Ionotophoresis '4mg'$ /ml Dexamethasone, and Manual therapy  PLAN FOR NEXT SESSION: possibly continue DN, manual if indicated, trial nerve glides, postural and shoulder strength   Vianne Bulls Florette Thai, PT 06/07/2022, 8:17 AM

## 2022-06-08 ENCOUNTER — Ambulatory Visit: Payer: 59 | Admitting: Hematology

## 2022-06-08 ENCOUNTER — Other Ambulatory Visit: Payer: Self-pay | Admitting: Radiology

## 2022-06-08 DIAGNOSIS — C20 Malignant neoplasm of rectum: Secondary | ICD-10-CM

## 2022-06-08 NOTE — H&P (Signed)
Chief Complaint: Patient was seen in consultation today for metastatic rectal cancer at the request of [facility]  Referring Physician(s): K[place]  Supervising Physician: Aletta Edouard  Patient Status: [facility] - Out-pt  History of Present Illness:  Samantha Reynolds is a 57 y.o. 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 She underwent liver mass biopsy 03/09/2018 that was consistent with adenocarcinoma. She has been treated with chemoradiation and microwave ablation of right hepatic lobe 07/2019. Most recently, her CEA has increased to 20.1 from 5.  PET scan 05/26/22 revealed:  Hypermetabolic LEFT upper lobe nodule along the fissures concerning for pulmonary metastasis. 2. Interval resolution of hypermetabolic metastasis in the LEFT lower lobe, hilum, and mediastinum. 3. 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 Activity is intense with SUV max equal 7.5. Additionally there is sclerotic change on the CT portion exam.   New activity in this C6 vertebral body is consistent with new skeletal metastasis.  Pt has been referred to IR for liver lesion biopsy. Imaging was reviewed and approved by Dr. Vernard Gambles for right adrenal mass biopsy.   Pt denies fever, chills, fatigue, SOB, CP, abd pain, N/V, dizziness, HA or weakness.  She endorses reduced appetite and chronic right shoulder pain.  She is NPO per order. 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   Worse in her 17s when she was ill   H/O  Hashimoto thyroiditis    History of chemotherapy    History of radiation therapy    History of thyroid cancer no recurrence   2013--  s/p  left lobe thyroidectomy--  follicular varient papillary / lymphocyctic thyroiditis   Hyperlipidemia 11/09/2015   Hypothyroidism    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[place]) 02/26/2018    Past Surgical History:  Procedure Laterality Date   BIOPSY  02/19/2018   Procedure: BIOPSY;  Surgeon: Rogene Houston, MD;  [facility];  Service: Endoscopy;;  rectum   COLONOSCOPY N/A 08/20/2015   Procedure: COLONOSCOPY;  Surgeon: Rogene Houston, MD;  [facility];  Service: Endoscopy;  Laterality: N/A;  730   COLONOSCOPY WITH PROPOFOL N/A 07/21/2021   Procedure: COLONOSCOPY WITH PROPOFOL;  Surgeon: Rogene Houston, MD;  [facility];  Service: Endoscopy;  Laterality: N/A;  10:10   D & C HYSTEROOSCOPY W/ THERMACHOICE ENDOMETRIAL ABLATION  08-13-2004   DIVERTING ILEOSTOMY N/A 12/07/2018   Procedure: DIVERTING LOOP ILEOSTOMY;  Surgeon: Leighton Ruff, MD;  Location: [facility];  Service: General;  Laterality: N/A;   FLEXIBLE SIGMOIDOSCOPY N/A 02/19/2018   Procedure: FLEXIBLE SIGMOIDOSCOPY;  Surgeon: Rogene Houston, MD;  [facility];  Service: Endoscopy;  Laterality: N/A;   HYSTEROSCOPY WITH D & C N/A 04/30/2015   Procedure: DILATATION AND CURETTAGE / INTENDED HYSTEROSCOPY;  Surgeon: Dian Queen, MD;  Location: [facility];  Service: Gynecology;  Laterality: N/A;   ILEOSTOMY CLOSURE N/A 04/17/2019   Procedure: LOOP ILEOSTOMY REVERSAL;  Surgeon: Leighton Ruff, MD;  [facility];  Service: General;  Laterality:  N/A;   IR US GUIDE BX ASP/DRAIN  03/09/2018   LAPAROSCOPIC CHOLECYSTECTOMY  04/1996   LAPAROSCOPY N/A 12/11/2018   Procedure: LAPAROSCOPY  ILEOSTOMY REVISION AND ABDOMINAL WASHOUT;  Surgeon: Leighton Ruff, MD;  [facility];  Service: General;  Laterality: N/A;   Liver Microwave   10/17/2018    POLYPECTOMY  08/20/2015   Procedure: POLYPECTOMY;  Surgeon: Rogene Houston, MD;  [facility];  Service: Endoscopy;;  Splenic flexure polypectomy   POLYPECTOMY  02/19/2018   Procedure: POLYPECTOMY;  Surgeon: Rogene Houston, MD;  [facility];  Service: Endoscopy;;  rectum   PORTACATH PLACEMENT N/A 03/14/2018   Procedure: INSERTION PORT-A-CATH;  Surgeon: Aviva Signs, MD;  Location: [facility];  Service: General;  Laterality: N/A;   REDUCTION INCARCERATED UTERUS  06-20-2000   intrauterine preg. 13 wks /  urinary retention   THYROID LOBECTOMY  11/24/2011   Procedure: THYROID LOBECTOMY;  Surgeon: Earnstine Regal, MD;  [facility];  Service: General;  Laterality: Left;  Left Thyroid Lobectomy   TUBAL LIGATION  2002   XI ROBOTIC ASSISTED LOWER ANTERIOR RESECTION N/A 12/07/2018   Procedure: XI ROBOTIC ASSISTED LOWER ANTERIOR RESECTION, SPENIC FLEXURE IMMOBILIZATION, COLOANAL ANASTOMOSIS, RIGID PROCTOSCOPY;  Surgeon: Leighton Ruff, MD;  [facility];  Service: General;  Laterality: N/A;    Allergies: Demerol [meperidine], Penicillins, Levaquin [levofloxacin], Other, and Vancomycin  Medications: Prior to Admission medications   Medication Sig Start Date End Date Taking? Authorizing Provider  ALPRAZolam Duanne Moron) 0.5 MG tablet Take 1 tablet (0.5 mg total) by mouth 3 (three) times daily as needed for anxiety. 03/31/22   [name], MD  amitriptyline (ELAVIL) 10 MG tablet Take 1 tablet (10 mg total) by mouth 2 (two) times daily. 07/15/21   Rehman, Mechele Dawley, MD  ARMOUR THYROID 60 MG tablet Take 1 tablet (60 mg total) by mouth daily. 09/14/21     ARMOUR THYROID 60 MG tablet Take 1 tablet (60 mg total) by mouth daily. 03/16/22     loperamide (IMODIUM) 2 MG capsule Take 1 capsule (2 mg total) by mouth 3 (three) times daily. Patient taking differently: Take 2 mg by mouth daily. [id number]   Leighton Ruff, MD  predniSONE (STERAPRED UNI-PAK 48 TAB) 10 MG (48) TBPK tablet Take as  directed 05/20/22   Carole Civil, MD  pregabalin (LYRICA) 25 MG capsule Take 1 capsule (25 mg total) by mouth 2 (two) times daily. 03/31/22   [name], MD  prochlorperazine (COMPAZINE) 10 MG tablet Take 1 tablet (10 mg total) by mouth every 6 (six) hours as needed for nausea or vomiting. 03/10/20   Derek Jack, MD  tiZANidine (ZANAFLEX) 4 MG tablet Take 1 tablet (4 mg total) by mouth every 6 (six) hours as needed for muscle spasms (take for tightness and pain in the scapular region). 05/20/22 05/20/23  Carole Civil, MD     Family History  Problem Relation Age of Onset   Hypertension Mother    Hypertension Father    Prostate cancer Father    Cancer Maternal Aunt        spine/back   Cancer Paternal Grandfather        lung   Healthy Son    Healthy Son    Healthy Son    Healthy Son    Colon cancer Neg Hx     Social History   Socioeconomic History   Marital status: Married    Spouse name: Not on file  Number of children: 4   Years of education: Not on file   Highest education level: Not on file  Occupational History    Comment: Scheduler at [employer] Use   Smoking status: Former    Packs/day: 0.25    Years: 10.00    Additional pack years: 0.00    Total pack years: 2.50    Types: Cigarettes    Quit date: 10/31/1991    Years since quitting: 30.6   Smokeless tobacco: Never  Vaping Use   Vaping Use: Never used  Substance and Sexual Activity   Alcohol use: No   Drug use: No   Sexual activity: Not Currently  Other Topics Concern   Not on file  Social History Narrative   Lives with husband [name] and 65 year old son [name]   Husband is Interior and spatial designer of [employer]   Social Determinants of Health   Financial Resource Strain: Low Risk  (02/26/2018)   Overall Financial Resource Strain (CARDIA)    Difficulty of Paying Living Expenses: Not hard at all  Food Insecurity: No Food Insecurity (02/26/2018)   Hunger Vital Sign    Worried About Running  Out of Food in the Last Year: Never true    [place] in the Last Year: Never true  Transportation Needs: No Transportation Needs (02/26/2018)   PRAPARE - Hydrologist (Medical): No    Lack of Transportation (Non-Medical): No  Physical Activity: Inactive (02/26/2018)   Exercise Vital Sign    Days of Exercise per Week: 0 days    Minutes of Exercise per Session: 0 min  Stress: Stress Concern Present (02/26/2018)   [place]    Feeling of Stress : To some extent  Social Connections: Socially Integrated (02/26/2018)   Social Connection and Isolation Panel [NHANES]    Frequency of Communication with Friends and Family: More than three times a week    Frequency of Social Gatherings with Friends and Family: Twice a week    Attends Religious Services: More than 4 times per year    Active Member of [employer] or Organizations: Yes    Attends Music therapist: More than 4 times per year    Marital Status: Married     Review of Systems: A 12 point ROS discussed and pertinent positives are indicated in the HPI above.  All other systems are negative.  Review of Systems  Constitutional:  Positive for appetite change. Negative for chills, fatigue and fever.  Respiratory:  Negative for shortness of breath.   Cardiovascular:  Negative for chest pain.  Gastrointestinal:  Negative for abdominal pain, nausea and vomiting.  Musculoskeletal:  Positive for arthralgias.       Chronic right shoulder pain  Neurological:  Negative for dizziness, weakness and headaches.    Vital Signs: BP 131/73   Pulse 95   Temp 98 F (36.7 C) (Oral)   Resp 17   Ht '5\' 6"'$  (1.676 m)   Wt 140 lb (63.5 kg)   LMP 02/16/2015 (Approximate)   SpO2 100%   BMI 22.60 kg/m     Physical Exam Vitals reviewed.  Constitutional:      General: She is not in acute distress.    Appearance: Normal appearance. She is not  ill-appearing.  HENT:     Head: Normocephalic and atraumatic.     Mouth/Throat:     Mouth: Mucous membranes are dry.  Pharynx: Oropharynx is clear.  Eyes:     Extraocular Movements: Extraocular movements intact.     Pupils: Pupils are equal, round, and reactive to light.  Cardiovascular:     Rate and Rhythm: Normal rate.     Pulses: Normal pulses.     Heart sounds: Normal heart sounds.  Pulmonary:     Effort: Pulmonary effort is normal. No respiratory distress.     Breath sounds: Normal breath sounds.  Abdominal:     General: Bowel sounds are normal. There is no distension.     Palpations: Abdomen is soft.     Tenderness: There is no abdominal tenderness. There is no guarding.  Musculoskeletal:     Right lower leg: No edema.     Left lower leg: No edema.  Skin:    General: Skin is warm and dry.  Neurological:     Mental Status: She is alert and oriented to person, place, and time.  Psychiatric:        Mood and Affect: Mood normal.        Behavior: Behavior normal.        Thought Content: Thought content normal.        Judgment: Judgment normal.     Imaging: NM PET Image Restag (PS) Skull Base To Thigh  Addendum Date: 05/31/2022   ADDENDUM REPORT: 05/31/2022 16:35 ADDENDUM: Upon discussion with ordering physician Dr. Delton Coombes, there is new metabolic activity associated with the C6 vertebral body which extends into the RIGHT transverse process. Activity is intense with SUV max equal 7.5. Additionally there is sclerotic change on the CT portion exam. New activity in this C6 vertebral body is consistent with new skeletal metastasis. Findings conveyed toSREEDHAR KATRAGADDA on 05/31/2022  at16:35. Electronically Signed   By: Suzy Bouchard M.D.   On: 05/31/2022 16:35   Result Date: 05/31/2022 CLINICAL DATA:  Subsequent treatment strategy for metastatic colorectal carcinoma. EXAM: NUCLEAR MEDICINE PET SKULL BASE TO THIGH TECHNIQUE: 7.9 mCi F-18 FDG was injected intravenously.  Full-ring PET imaging was performed from the skull base to thigh after the radiotracer. CT data was obtained and used for attenuation correction and anatomic localization. Fasting blood glucose: 101 mg/dl COMPARISON:  CT 05/13/2022, PET-CT 08/19/2021 FINDINGS: Mediastinal blood pool activity: SUV max 2.8 Liver activity: SUV max NA NECK: No hypermetabolic lymph nodes in the neck. Incidental CT findings: None. CHEST: The nodule of concern along superior LEFT LEFT fissure does have metabolic activity which is mild (SUV max 2.9) but significant for size (7 mm, image 87/3) Resolution of previously seen hypermetabolic LEFT infrahilar nodule and LEFT lobe nodules. No residual metabolic activity LEFT lower lobe suggest residual carcinoma. Resolution of previously seen hypermetabolic subcarinal node. RIGHT upper lobe nodule mixed density nodule measuring 1.9 cm is relatively low metabolic activity (SUV max equal 2.7). Recommend attention on follow-up. Incidental CT findings: None ABDOMEN/PELVIS: Interval enlargement of the RIGHT adrenal gland to 2.0 x 3.0 cm with intense metabolic activity (SUV max equal 7.9). Intense metabolic activity in the lateral segment of the LEFT hepatic lobe with SUV max equal 7.9 on image 152. Difficult to define lesion on noncontrast CT. On comparison PET-CT scan there is evidence of metastatic disease in the LEFT hepatic lobe although the lesion lesion appeared smaller at that time. Incidental CT findings: Postcholecystectomy.  Uterus normal. SKELETON: No focal hypermetabolic activity to suggest skeletal metastasis. Incidental CT findings: None. IMPRESSION: 1. Hypermetabolic LEFT upper lobe nodule along the fissures concerning for pulmonary metastasis. 2. Interval resolution  of hypermetabolic metastasis in the LEFT lower lobe, hilum, and mediastinum. 3. 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 Electronically Signed: By: Suzy Bouchard M.D. On: 05/30/2022 16:38   DG Shoulder Right  Result Date: 05/23/2022 Right shoulder Right shoulder pain but pain is actually in the medial border the scapula Mild loss of congruency between the inferior femoral neck and inferior scapula.  Glenohumeral joint otherwise normal AC joint looks to have mild degenerative changes which include mild joint space narrowing and osteophyte formation Impression 1 break in congruency between the inferior humerus and scapula consistent with potential cuff disease 2 AC joint arthritis   CT CHEST ABDOMEN PELVIS W CONTRAST  Result Date: 05/13/2022 CLINICAL DATA:  Colorectal carcinoma. CEA tumor marker elevated. Insert by contrast CT EXAM: CT CHEST, ABDOMEN, AND PELVIS WITH CONTRAST TECHNIQUE: Multidetector CT imaging of the chest, abdomen and pelvis was performed following the standard protocol during bolus administration of intravenous contrast. RADIATION DOSE REDUCTION: This exam was performed according to the departmental dose-optimization program which includes automated exposure control, adjustment of the mA and/or kV according to patient size and/or use of iterative reconstruction technique. CONTRAST:  155m OMNIPAQUE IOHEXOL 300 MG/ML  SOLN COMPARISON:  None Available. FINDINGS: CT CHEST FINDINGS Cardiovascular: Ascending thoracic aorta measures 40 mm in diameter (image 101/sagittal series 5) Port in the anterior chest wall with tip in distal SVC. Mediastinum/Nodes: No axillary or supraclavicular adenopathy. No mediastinal or hilar adenopathy. No pericardial fluid. Esophagus normal. Lungs/Pleura: LEFT infrahilar consolidation with irregular margins (image 79/4) is increased from prior. Prior radiation treatment. Medial RIGHT upper lobe nodularity with a rim of ground-glass opacity is increased from prior. The central solid portion measures 1.2 cm compared to 1.2 cm on prior (image 32/4) LEFT upper lobe nodule  along the fissure measures 7 mm on image 56/4 compared to 4 mm. Musculoskeletal: No aggressive osseous lesion. CT ABDOMEN AND PELVIS FINDINGS Hepatobiliary: Posterior RIGHT hepatic lobe lesion measures 17 mm (63) compared to 17 mm. More inferior RIGHT hepatic lobe lesion measures 22 mm also unchanged. Postcholecystectomy. Pancreas: Pancreas is normal. No ductal dilatation. No pancreatic inflammation. Spleen: Cystic lesion in the spleen is unchanged. Adrenals/urinary tract: Adrenal glands and kidneys are normal. The ureters and bladder normal. Stomach/Bowel: The stomach, duodenum, and small bowel normal. LEFT low rectal anastomosis (image 128). No pelvic lymphadenopathy.  No mesenteric lymphadenopathy. Vascular/Lymphatic: Abdominal aorta is normal caliber. There is no retroperitoneal or periportal lymphadenopathy. No pelvic lymphadenopathy. Reproductive: Unremarkable Other: No peritoneal metastasis Musculoskeletal: No aggressive osseous lesion. IMPRESSION: CHEST IMPRESSION: 1. Increase in LEFT infrahilar consolidation with irregular margins. Differential includes post radiation change versus local recurrence. Favor post radiation change. 2. Stable RIGHT upper lobe pulmonary nodule with a rim of ground-glass opacity. Recommend continued observation. 3. Interval increase in size of solitary LEFT upper lobe pulmonary nodule along the fissure. Enlargement is concerning for metastatic lesion. Recommend repeat PET-CT scan. 4. No mediastinal adenopathy. 5. Stable 4 cm ascending thoracic aorta. Recommend annual imaging followup by CTA or MRA. This recommendation follows 2010 ACCF/AHA/AATS/ACR/ASA/SCA/SCAI/SIR/STS/SVM Guidelines for the Diagnosis and Management of Patients with Thoracic Aortic Disease. Circulation. 2010; 121:[id number] Aortic aneurysm NOS (ICD10-I71.9) PELVIS IMPRESSION: 1. Stable treated hepatic metastasis. 2. No evidence of metastatic disease in the abdomen pelvis. 3. No evidence of local recurrence at the  LEFT low rectal anastomosis. Electronically Signed   By: SHelane GuntherD.  On: 05/13/2022 16:57    Labs:  CBC: Recent Labs    08/02/21 1404 01/04/22 1352 05/11/22 1400  WBC 3.2* 3.0* 3.1*  HGB 12.2 10.4* 12.3  HCT 34.9* 29.8* 35.3*  PLT 109* 108* 117*    COAGS: No results for input(s): "INR", "APTT" in the last 8760 hours.  BMP: Recent Labs    07/21/21 1028 08/02/21 1404 01/04/22 1352 05/11/22 1400  NA 139 136 138 135  K 3.1* 3.5 3.9 3.4*  CL 101 103 106 100  CO2 '29 26 26 25  '$ GLUCOSE 100* 97 95 95  BUN '7 14 16 17  '$ CALCIUM 8.8* 8.9 8.5* 8.6*  CREATININE 0.90 0.93 0.78 0.91  GFRNONAA >60 >60 >60 >60    LIVER FUNCTION TESTS: Recent Labs    07/21/21 1028 08/02/21 1404 01/04/22 1352 05/11/22 1400  BILITOT 3.4* 2.1* 2.2* 2.5*  AST '22 21 19 21  '$ ALT '12 14 11 12  '$ ALKPHOS 82 78 100 131*  PROT 7.3 7.7 6.8 7.3  ALBUMIN 4.2 4.2 4.0 4.1    TUMOR MARKERS: No results for input(s): "AFPTM", "CEA", "CA199", "CHROMGRNA" in the last 8760 hours.  Assessment and Plan:  57 yo female with PMHx of anxiety, thyroid cancer, GERD and metastatic rectal cancer presents to IR for right adrenal mass versus liver lesion biopsy.   Pt sitting upright in bed with nursing staff at bedside.  She is A&O, calm and pleasant.  She is in no distress.  Dr. Kathlene Cote to discuss with patient right adrenal mass biopsy versus liver lesion biopsy.   Risks and benefits of right adrenal gland versus liver lesion biopsy with moderate sedation was discussed with the patient and/or patient's family including, but not limited to bleeding, infection, damage to adjacent structures or low yield requiring additional tests.  All of the questions were answered and there is agreement to proceed.  Consent signed and in chart.  Thank you for this interesting consult.  I greatly enjoyed meeting Samantha Reynolds and look forward to participating in their care.  A copy of this report was sent to the  requesting provider on this date.  Electronically Signed: Tyson Alias, NP 06/09/2022, 9:01 AM   I spent a total of 20 minutes in face to face in clinical consultation, greater than 50% of which was counseling/coordinating care for metastatic rectal cancer.

## 2022-06-09 ENCOUNTER — Encounter: Payer: Self-pay | Admitting: *Deleted

## 2022-06-09 ENCOUNTER — Other Ambulatory Visit: Payer: Self-pay

## 2022-06-09 ENCOUNTER — Encounter (HOSPITAL_COMMUNITY): Payer: 59 | Admitting: Physical Therapy

## 2022-06-09 ENCOUNTER — Ambulatory Visit (HOSPITAL_COMMUNITY)
Admission: RE | Admit: 2022-06-09 | Discharge: 2022-06-09 | Disposition: A | Discharge status: Home / Self Care | Payer: 59 | Source: Ambulatory Visit | Attending: Hematology | Admitting: Hematology

## 2022-06-09 DIAGNOSIS — C20 Malignant neoplasm of rectum: Secondary | ICD-10-CM | POA: Diagnosis not present

## 2022-06-09 DIAGNOSIS — C19 Malignant neoplasm of rectosigmoid junction: Secondary | ICD-10-CM | POA: Insufficient documentation

## 2022-06-09 DIAGNOSIS — C787 Secondary malignant neoplasm of liver and intrahepatic bile duct: Secondary | ICD-10-CM | POA: Insufficient documentation

## 2022-06-09 DIAGNOSIS — C189 Malignant neoplasm of colon, unspecified: Secondary | ICD-10-CM | POA: Insufficient documentation

## 2022-06-09 DIAGNOSIS — Z85048 Personal history of other malignant neoplasm of rectum, rectosigmoid junction, and anus: Secondary | ICD-10-CM | POA: Diagnosis not present

## 2022-06-09 LAB — CBC
HCT: 31.4 % — ABNORMAL LOW (ref 36.0–46.0)
Hemoglobin: 10.9 g/dL — ABNORMAL LOW (ref 12.0–15.0)
MCH: 33.5 pg (ref 26.0–34.0)
MCHC: 34.7 g/dL (ref 30.0–36.0)
MCV: 96.6 fL (ref 80.0–100.0)
Platelets: 106 10*3/uL — ABNORMAL LOW (ref 150–400)
RBC: 3.25 MIL/uL — ABNORMAL LOW (ref 3.87–5.11)
RDW: 13.1 % (ref 11.5–15.5)
WBC: 4.2 10*3/uL (ref 4.0–10.5)
nRBC: 0 % (ref 0.0–0.2)

## 2022-06-09 LAB — PROTIME-INR
INR: 1 (ref 0.8–1.2)
Prothrombin Time: 13 seconds (ref 11.4–15.2)

## 2022-06-09 MED ORDER — FENTANYL CITRATE (PF) 100 MCG/2ML IJ SOLN
INTRAMUSCULAR | Status: AC | PRN
  Administered 2022-06-09 (×2): 50 ug via INTRAVENOUS

## 2022-06-09 MED ORDER — HEPARIN SOD (PORK) LOCK FLUSH 100 UNIT/ML IV SOLN
500.0000 [IU] | Freq: Once | INTRAVENOUS | Status: AC
  Administered 2022-06-09: 500 [IU] via INTRAVENOUS
  Filled 2022-06-09: qty 5

## 2022-06-09 MED ORDER — SODIUM CHLORIDE 0.9 % IV SOLN: INTRAVENOUS | Status: DC

## 2022-06-09 MED ORDER — FENTANYL CITRATE (PF) 100 MCG/2ML IJ SOLN
INTRAMUSCULAR | Status: AC
  Filled 2022-06-09: qty 2

## 2022-06-09 MED ORDER — LIDOCAINE HCL 1 % IJ SOLN: 10.0000 mL | Freq: Once | INTRAMUSCULAR | Status: DC

## 2022-06-09 MED ORDER — MIDAZOLAM HCL 2 MG/2ML IJ SOLN
INTRAMUSCULAR | Status: AC
  Filled 2022-06-09: qty 2

## 2022-06-09 MED ORDER — HEPARIN SOD (PORK) LOCK FLUSH 100 UNIT/ML IV SOLN
INTRAVENOUS | Status: AC
  Filled 2022-06-09: qty 5

## 2022-06-09 MED ORDER — MIDAZOLAM HCL 2 MG/2ML IJ SOLN
INTRAMUSCULAR | Status: AC | PRN
  Administered 2022-06-09 (×2): 1 mg via INTRAVENOUS

## 2022-06-09 NOTE — Progress Notes (Signed)
Left upper chest Port accessed with 20x1in huber needle; port with good blood return and flushed easily with saline

## 2022-06-09 NOTE — Progress Notes (Signed)
Left upper chest port flushed with 89m saline and heparin 100u/ml x 548mand huber needle d/ced intact and site unremarkable

## 2022-06-09 NOTE — Progress Notes (Signed)
Cardiology referral placed per Dr. Tomie China request for assessment of ascending thoracic aortic aneurysm.

## 2022-06-09 NOTE — Procedures (Signed)
Interventional Radiology Procedure Note  Procedure: CT Guided Biopsy of left lobe liver mass  Complications: None  Estimated Blood Loss: < 10 mL  Findings: 18 G core biopsy of left lobe liver lesion performed under CT guidance.  Four core samples obtained and sent to Pathology.  Venetia Night. Kathlene Cote, M.D Pager:  [phone]

## 2022-06-10 LAB — SURGICAL PATHOLOGY

## 2022-06-13 NOTE — Progress Notes (Signed)
[employer] FMLA papers completed and faxed with confirmation received.   Fax no. [phone]

## 2022-06-14 ENCOUNTER — Ambulatory Visit (HOSPITAL_COMMUNITY): Payer: 59 | Admitting: Physical Therapy

## 2022-06-14 ENCOUNTER — Encounter (HOSPITAL_COMMUNITY): Payer: Self-pay | Admitting: Physical Therapy

## 2022-06-14 DIAGNOSIS — M6281 Muscle weakness (generalized): Secondary | ICD-10-CM

## 2022-06-14 DIAGNOSIS — R29898 Other symptoms and signs involving the musculoskeletal system: Secondary | ICD-10-CM

## 2022-06-14 DIAGNOSIS — M542 Cervicalgia: Secondary | ICD-10-CM

## 2022-06-14 NOTE — Therapy (Signed)
OUTPATIENT PHYSICAL THERAPY TREATMENT   Patient Name: Samantha Reynolds MRN: [id number] DOB:1965/06/03, 57 y.o., female Today's Date: 06/14/2022  END OF SESSION:  PT End of Session - 06/14/22 0730     Visit Number 3    Number of Visits 12    Date for PT Re-Evaluation 07/04/22    Authorization Type [place] [employer]    PT Start Time 0730    PT Stop Time 0808    PT Time Calculation (min) 38 min    Activity Tolerance Patient tolerated treatment well    Behavior During Therapy [facility] for tasks assessed/performed             Past Medical History:  Diagnosis Date   Anxiety    Cancer ([place]) 2013   thyroid   Endometrial polyp    Endometrial thickening on ultra sound    GERD (gastroesophageal reflux disease)    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   Worse in her 35s when she was ill   H/O Hashimoto thyroiditis    History of chemotherapy    History of radiation therapy    History of thyroid cancer no recurrence   2013--  s/p  left lobe thyroidectomy--  follicular varient papillary / lymphocyctic thyroiditis   Hyperlipidemia 11/09/2015   Hypothyroidism    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   Past Surgical History:  Procedure Laterality Date   BIOPSY  02/19/2018   Procedure: BIOPSY;  Surgeon: Rogene Houston, MD;  [facility];  Service: Endoscopy;;  rectum   COLONOSCOPY N/A 08/20/2015   Procedure: COLONOSCOPY;  Surgeon: Rogene Houston, MD;  [facility];  Service: Endoscopy;  Laterality: N/A;  730   COLONOSCOPY WITH PROPOFOL N/A 07/21/2021   Procedure: COLONOSCOPY WITH PROPOFOL;  Surgeon: Rogene Houston, MD;  [facility];  Service: Endoscopy;  Laterality: N/A;  10:10   D & C HYSTEROOSCOPY W/ THERMACHOICE ENDOMETRIAL ABLATION  08-13-2004   DIVERTING ILEOSTOMY N/A 12/07/2018   Procedure: DIVERTING LOOP ILEOSTOMY;  Surgeon: Leighton Ruff, MD;  [facility];  Service: General;  Laterality: N/A;   FLEXIBLE SIGMOIDOSCOPY N/A 02/19/2018    Procedure: FLEXIBLE SIGMOIDOSCOPY;  Surgeon: Rogene Houston, MD;  [facility];  Service: Endoscopy;  Laterality: N/A;   HYSTEROSCOPY WITH D & C N/A 04/30/2015   Procedure: DILATATION AND CURETTAGE / INTENDED HYSTEROSCOPY;  Surgeon: Dian Queen, MD;  Location: [place];  Service: Gynecology;  Laterality: N/A;   ILEOSTOMY CLOSURE N/A 04/17/2019   Procedure: LOOP ILEOSTOMY REVERSAL;  Surgeon: Leighton Ruff, MD;  [facility];  Service: General;  Laterality: N/A;   IR US GUIDE BX ASP/DRAIN  03/09/2018   LAPAROSCOPIC CHOLECYSTECTOMY  04/1996   LAPAROSCOPY N/A 12/11/2018   Procedure: LAPAROSCOPY  ILEOSTOMY REVISION AND ABDOMINAL WASHOUT;  Surgeon: Leighton Ruff, MD;  [facility];  Service: General;  Laterality: N/A;   Liver Microwave   10/17/2018   POLYPECTOMY  08/20/2015   Procedure: POLYPECTOMY;  Surgeon: Rogene Houston, MD;  [facility];  Service: Endoscopy;;  Splenic flexure polypectomy   POLYPECTOMY  02/19/2018   Procedure: POLYPECTOMY;  Surgeon: Rogene Houston, MD;  [facility];  Service: Endoscopy;;  rectum   PORTACATH PLACEMENT N/A 03/14/2018   Procedure: INSERTION PORT-A-CATH;  Surgeon: Aviva Signs, MD;  [facility]: [facility];  Service: General;  Laterality: N/A;   REDUCTION INCARCERATED UTERUS  06-20-2000   intrauterine preg. 13 wks /  urinary retention  THYROID LOBECTOMY  11/24/2011   Procedure: THYROID LOBECTOMY;  Surgeon: Earnstine Regal, MD;  [facility];  Service: General;  Laterality: Left;  Left Thyroid Lobectomy   TUBAL LIGATION  2002   XI ROBOTIC ASSISTED LOWER ANTERIOR RESECTION N/A 12/07/2018   Procedure: XI ROBOTIC ASSISTED LOWER ANTERIOR RESECTION, SPENIC FLEXURE IMMOBILIZATION, COLOANAL ANASTOMOSIS, RIGID PROCTOSCOPY;  Surgeon: Leighton Ruff, MD;  [facility];  Service: General;  Laterality: N/A;   Patient Active Problem List   Diagnosis Date Noted   Thrombocytopenia, unspecified ([place]) 03/31/2022    Insomnia 09/27/2021   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   H/O Hashimoto thyroiditis    Hypothyroidism    Rectal cancer metastasized to liver ([place]) 10/04/2018   Hyperbilirubinemia 04/24/2018   Abnormal EKG 03/28/2018   Severe sepsis ([place]) 03/28/2018   Sepsis due to undetermined organism ([place]) 03/27/2018   GERD (gastroesophageal reflux disease) 03/27/2018   Anxiety 03/27/2018   Lactic acidosis 03/27/2018   Hypokalemia 03/27/2018   Antineoplastic chemotherapy induced pancytopenia ([place]) 03/27/2018   Hyperglycemia 03/27/2018   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[place]) 02/26/2018   Hematochezia 01/24/2018   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   Hyperlipidemia 11/09/2015   Sciatica of right side 06/26/2012   History of thyroid cancer 04/02/2012   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   Pain in joint, ankle and foot 03/09/2011    PCP: Sallee Lange MD  REFERRING PROVIDER: Carole Civil, MD  REFERRING DIAG: M54.2 (ICD-10-CM) - Neck pain M79.10 (ICD-10-CM) - Trigger point of right side of body  THERAPY DIAG:  Cervicalgia  Muscle weakness (generalized)  Other symptoms and signs involving the musculoskeletal system  Rationale for Evaluation and Treatment: Rehabilitation  ONSET DATE: a little over a month  SUBJECTIVE:                                                                                                                                                                                                         SUBJECTIVE STATEMENT: Patient states symptoms have been a little bit better. Felt pretty good for 2-3 hours following last session. Didn't sleep well that night. Has been taking steroid pack some. Feeling better Thursday. Stopped taking steroid due to constipation. R thumb numb/tingling feel asleep. Decrease in R upper arm symptoms. Notes some popping and cracking in R shoulder region. Much more aware of posture.   EVAL:  Patient states R sided neck/ periscapular pain with pain into R arm to triceps. She had a shot near scapula which helped a little. Some  tingling into thumb and index and middle finger. Massage to region helped for a few days. The last couple of weeks it has been pretty constant. Massage now only helping for a couple of hours. Thinks it has something to do with being sedentary with cancer.   PERTINENT HISTORY:  HLD, hx cancer, hx frozen shoulder  PAIN:  Are you having pain? Yes: NPRS scale: 4/10 Pain location: R neck/shoulder Pain description: tingling, burning, stabbing, sharp Aggravating factors: sitting, computer work with mouse Relieving factors: movement  PRECAUTIONS: None  WEIGHT BEARING RESTRICTIONS: No  FALLS:  Has patient fallen in last 6 months? No  OCCUPATION: scheduling for [employer]  PLOF: Independent  PATIENT GOALS: pain relief  NEXT MD VISIT: April  OBJECTIVE:   PATIENT SURVEYS: FOTO 66% function  COGNITION: Overall cognitive status: Within functional limits for tasks assessed  SENSATION: decreased R C5  POSTURE: rounded shoulders and forward head  PALPATION: TTP R levator scapulae, UT; hyperactive and tender cervical paraspinals and suboccipitals R>L; hypomobile R and L UPA    CERVICAL ROM:   Active ROM A/PROM  eval  Flexion 0% limited  Extension 25% limited *  Right lateral flexion 75% limited *  Left lateral flexion 50% limited *  Right rotation 25% limited  Left rotation 25% limited *   (Blank rows = not tested)*=pain  UPPER EXTREMITY ROM: WFL for tasks assessed  Active ROM Right eval Left eval  Shoulder flexion    Shoulder extension    Shoulder abduction    Shoulder adduction    Shoulder extension    Shoulder internal rotation    Shoulder external rotation    Elbow flexion    Elbow extension    Wrist flexion    Wrist extension    Wrist ulnar deviation    Wrist radial deviation    Wrist pronation    Wrist supination      (Blank rows = not tested)  UPPER EXTREMITY MMT:  MMT Right eval Left eval  Shoulder flexion 4+ 4+  Shoulder extension    Shoulder abduction 4+ 5  Shoulder adduction    Shoulder extension    Shoulder internal rotation 5 5  Shoulder external rotation 4+ 4+  Middle trapezius    Lower trapezius    Elbow flexion 5 5  Elbow extension 5 5  Wrist flexion 5 5  Wrist extension 4 5  Wrist ulnar deviation    Wrist radial deviation    Wrist pronation    Wrist supination    Grip strength decreased WFL   (Blank rows = not tested)  CERVICAL SPECIAL TESTS:  Distraction test: Positive   TODAY'S TREATMENT:                                                                                                                              DATE:  06/14/22 Manual: STM to R UT pre and post dry needling for trigger point identification and muscular  relaxation.  Trigger Point Dry-Needling  Treatment instructions: Expect mild to moderate muscle soreness. S/S of pneumothorax if dry needled over a lung field, and to seek immediate medical attention should they occur. Patient verbalized understanding of these instructions and education.  Patient Consent Given: Yes Education handout provided: Previously provided Muscles treated: R UT in prone and supine Electrical stimulation performed: No Parameters: N/A Treatment response/outcome: twitch response elicited, decrease in symptoms   Seated scapular retractions 2 x 10   Seated cervical retractions 2 x 10  Seated UT stretch 3 x 30 second holds for R Median nerve glides 1x 10  06/07/22 Manual: STM to R UT pre and post dry needling for trigger point identification and muscular relaxation.  Trigger Point Dry-Needling  Treatment instructions: Expect mild to moderate muscle soreness. S/S of pneumothorax if dry needled over a lung field, and to seek immediate medical attention should they occur. Patient verbalized understanding of these instructions and  education.  Patient Consent Given: Yes Education handout provided: Previously provided Muscles treated: R UT in prone and supine Electrical stimulation performed: No Parameters: N/A Treatment response/outcome: twitch response elicited, decrease in symptoms   Supine cervical retractions 2 x 10 Supine scapular retractions 2x 10 Seated scapular retractions 2 x 10   Seated cervical retractions 2 x 10  Seated UT stretch 2 x 30 second holds for R  05/23/22 Supine cervical retractions 2 x 10 Supine scapular retractions 2x 10 Seated scapular retractions 2 x 10    PATIENT EDUCATION:  Education details: 06/07/22: dry needling, HEP;  EVAL: Patient educated on exam findings, POC, scope of PT, HEP, and posture. Person educated: Patient Education method: Explanation, Demonstration, and Handouts Education comprehension: verbalized understanding, returned demonstration, verbal cues required, and tactile cues required  HOME EXERCISE PROGRAM: Access Code: [email or web address] URL: [email or web address] 06/14/22 - Standing Median Nerve Glide  - 2-3 x daily - 7 x weekly - 1-2 sets - 10 reps  06/07/22 - Seated Cervical Retraction  - 3 x daily - 7 x weekly - 3 sets - 10 reps - Seated Gentle Upper Trapezius Stretch  - 3 x daily - 7 x weekly - 3 reps - 20-30 second hold  Date: 05/23/2022 - Supine Chin Tuck  - 3 x daily - 7 x weekly - 2 sets - 10 reps - 2 second hold - Supine Scapular Retraction  - 3 x daily - 7 x weekly - 2 sets - 10 reps - Seated Scapular Retraction  - 3 x daily - 7 x weekly - 2 sets - 10 reps  ASSESSMENT:  CLINICAL IMPRESSION: Completed dry needling again in supine with multiple twitch responses and decrease in tissue tension with decrease in symptoms following. Continued R thumb symptoms following. Performed previously completed exercises in which patient performs with good mechanics with minimal to no cueing.  No improvement in symptoms with nerve glide although she does feel  neural tension while completing, possibly trial radial nerve glide next session. Patient will continue to benefit from physical therapy in order to improve function and reduce impairment.   OBJECTIVE IMPAIRMENTS: decreased activity tolerance, decreased endurance, decreased mobility, decreased ROM, decreased strength, hypomobility, increased muscle spasms, impaired flexibility, improper body mechanics, postural dysfunction, and pain.   ACTIVITY LIMITATIONS: carrying, lifting, bending, reach over head, and caring for others  PARTICIPATION LIMITATIONS: meal prep, cleaning, laundry, driving, shopping, community activity, occupation, and yard work  PERSONAL FACTORS: 3+ comorbidities: HLD, hx cancer, hx frozen shoulder  are also affecting patient's functional outcome.  REHAB POTENTIAL: Good  CLINICAL DECISION MAKING: Stable/uncomplicated  EVALUATION COMPLEXITY: Low   GOALS: Goals reviewed with patient? Yes  SHORT TERM GOALS: Target date: 06/13/2022    Patient will be independent with HEP in order to improve functional outcomes. Baseline: Goal status: INITIAL  2.  Patient will report at least 25% improvement in symptoms for improved quality of life. Baseline:  Goal status: INITIAL    LONG TERM GOALS: Target date: 07/04/2022   Patient will report at least 75% improvement in symptoms for improved quality of life. Baseline:  Goal status: INITIAL  2.  Patient will improve FOTO score by at least 7 points in order to indicate improved tolerance to activity. Baseline: 66% function Goal status: INITIAL  3.  Patient will demonstrate at least 25% improvement in cervical ROM in all restricted planes for improved ability to move head while working and with chores. Baseline: see above Goal status: INITIAL  4.  Patient will be able to return to all activities unrestricted for improved ability to perform work functions and participate with family.  Baseline:  Goal status: INITIAL  5.   Patient will demonstrate grade of 5/5 MMT grade in all tested musculature as evidence of improved strength to assist with lifting.  Baseline: see above Goal status: INITIAL  PLAN:  PT FREQUENCY: 2x/week  PT DURATION: 6 weeks  PLANNED INTERVENTIONS: Therapeutic exercises, Therapeutic activity, Neuromuscular re-education, Balance training, Gait training, Patient/Family education, Joint manipulation, Joint mobilization, Stair training, Orthotic/Fit training, DME instructions, Aquatic Therapy, Dry Needling, Electrical stimulation, Spinal manipulation, Spinal mobilization, Cryotherapy, Moist heat, Compression bandaging, scar mobilization, Splintting, Taping, Traction, Ultrasound, Ionotophoresis 4mg /ml Dexamethasone, and Manual therapy  PLAN FOR NEXT SESSION: possibly continue DN, manual if indicated, trial nerve glides - possibly radial, postural and shoulder strength   Vianne Bulls Amra Shukla, PT 06/14/2022, 7:30 AM

## 2022-06-16 ENCOUNTER — Encounter (HOSPITAL_COMMUNITY): Payer: 59 | Admitting: Physical Therapy

## 2022-06-21 ENCOUNTER — Ambulatory Visit (HOSPITAL_COMMUNITY): Payer: 59 | Admitting: Physical Therapy

## 2022-06-21 ENCOUNTER — Encounter (HOSPITAL_COMMUNITY): Payer: Self-pay | Admitting: Physical Therapy

## 2022-06-21 DIAGNOSIS — M542 Cervicalgia: Secondary | ICD-10-CM

## 2022-06-21 DIAGNOSIS — M6281 Muscle weakness (generalized): Secondary | ICD-10-CM

## 2022-06-21 DIAGNOSIS — R29898 Other symptoms and signs involving the musculoskeletal system: Secondary | ICD-10-CM

## 2022-06-21 NOTE — Therapy (Signed)
OUTPATIENT PHYSICAL THERAPY TREATMENT   Patient Name: Samantha Reynolds MRN: :[id number] DOB:December 19, 1965, 57 y.o., female Today's Date: 06/21/2022  END OF SESSION:  PT End of Session - 06/21/22 0734     Visit Number 4    Number of Visits 12    Date for PT Re-Evaluation 07/04/22    Authorization Type [employer]    PT Start Time [id number]    PT Stop Time 0812    PT Time Calculation (min) 39 min    Activity Tolerance Patient tolerated treatment well    Behavior During Therapy [facility] for tasks assessed/performed             Past Medical History:  Diagnosis Date   Anxiety    Cancer ([place]) 2013   thyroid   Endometrial polyp    Endometrial thickening on ultra sound    GERD (gastroesophageal reflux disease)    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   Worse in her 68s when she was ill   H/O Hashimoto thyroiditis    History of chemotherapy    History of radiation therapy    History of thyroid cancer no recurrence   2013--  s/p  left lobe thyroidectomy--  follicular varient papillary / lymphocyctic thyroiditis   Hyperlipidemia 11/09/2015   Hypothyroidism    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/2018   Past Surgical History:  Procedure Laterality Date   BIOPSY  02/19/2018   Procedure: BIOPSY;  Surgeon: Rogene Houston, MD;  [facility];  Service: Endoscopy;;  rectum   COLONOSCOPY N/A 08/20/2015   Procedure: COLONOSCOPY;  Surgeon: Rogene Houston, MD;  [facility];  Service: Endoscopy;  Laterality: N/A;  730   COLONOSCOPY WITH PROPOFOL N/A 07/21/2021   Procedure: COLONOSCOPY WITH PROPOFOL;  Surgeon: Rogene Houston, MD;  [facility];  Service: Endoscopy;  Laterality: N/A;  10:10   D & C HYSTEROOSCOPY W/ THERMACHOICE ENDOMETRIAL ABLATION  08-13-2004   DIVERTING ILEOSTOMY N/A 12/07/2018   Procedure: DIVERTING LOOP ILEOSTOMY;  Surgeon: Leighton Ruff, MD;  [facility];  Service: General;  Laterality: N/A;   FLEXIBLE SIGMOIDOSCOPY N/A 02/19/2018    Procedure: FLEXIBLE SIGMOIDOSCOPY;  Surgeon: Rogene Houston, MD;  [facility];  Service: Endoscopy;  Laterality: N/A;   HYSTEROSCOPY WITH D & C N/A 04/30/2015   Procedure: DILATATION AND CURETTAGE / INTENDED HYSTEROSCOPY;  Surgeon: Dian Queen, MD;  Location: [place];  Service: Gynecology;  Laterality: N/A;   ILEOSTOMY CLOSURE N/A 04/17/2019   Procedure: LOOP ILEOSTOMY REVERSAL;  Surgeon: Leighton Ruff, MD;  [facility];  Service: General;  Laterality: N/A;   IR US GUIDE BX ASP/DRAIN  03/09/2018   LAPAROSCOPIC CHOLECYSTECTOMY  04/1996   LAPAROSCOPY N/A 12/11/2018   Procedure: LAPAROSCOPY  ILEOSTOMY REVISION AND ABDOMINAL WASHOUT;  Surgeon: Leighton Ruff, MD;  [facility];  Service: General;  Laterality: N/A;   Liver Microwave   10/17/2018   POLYPECTOMY  08/20/2015   Procedure: POLYPECTOMY;  Surgeon: Rogene Houston, MD;  [facility];  Service: Endoscopy;;  Splenic flexure polypectomy   POLYPECTOMY  02/19/2018   Procedure: POLYPECTOMY;  Surgeon: Rogene Houston, MD;  [facility];  Service: Endoscopy;;  rectum   PORTACATH PLACEMENT N/A 03/14/2018   Procedure: INSERTION PORT-A-CATH;  Surgeon: Aviva Signs, MD;  [facility];  Service: General;  Laterality: N/A;   REDUCTION INCARCERATED UTERUS  06-20-2000   intrauterine preg. 13 wks /  urinary retention  THYROID LOBECTOMY  11/24/2011   Procedure: THYROID LOBECTOMY;  Surgeon: Earnstine Regal, MD;  [facility];  Service: General;  Laterality: Left;  Left Thyroid Lobectomy   TUBAL LIGATION  2002   XI ROBOTIC ASSISTED LOWER ANTERIOR RESECTION N/A 12/07/2018   Procedure: XI ROBOTIC ASSISTED LOWER ANTERIOR RESECTION, SPENIC FLEXURE IMMOBILIZATION, COLOANAL ANASTOMOSIS, RIGID PROCTOSCOPY;  Surgeon: Leighton Ruff, MD;  [facility];  Service: General;  Laterality: N/A;   Patient Active Problem List   Diagnosis Date Noted   Thrombocytopenia, unspecified ([place]) 03/31/2022    Insomnia 09/27/2021   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   H/O Hashimoto thyroiditis    Hypothyroidism    Rectal cancer metastasized to liver ([place]) 10/04/2018   Hyperbilirubinemia 04/24/2018   Abnormal EKG 03/28/2018   Severe sepsis ([place]) 03/28/2018   Sepsis due to undetermined organism ([place]) 03/27/2018   GERD (gastroesophageal reflux disease) 03/27/2018   Anxiety 03/27/2018   Lactic acidosis 03/27/2018   Hypokalemia 03/27/2018   Antineoplastic chemotherapy induced pancytopenia ([place]) 03/27/2018   Hyperglycemia 03/27/2018   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[place]) 02/26/2018   Hematochezia 01/24/2018   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   Hyperlipidemia 11/09/2015   Sciatica of right side 06/26/2012   History of thyroid cancer 04/02/2012   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   Pain in joint, ankle and foot 03/09/2011    PCP: Sallee Lange MD  REFERRING PROVIDER: Carole Civil, MD  REFERRING DIAG: M54.2 (ICD-10-CM) - Neck pain M79.10 (ICD-10-CM) - Trigger point of right side of body  THERAPY DIAG:  Cervicalgia  Muscle weakness (generalized)  Other symptoms and signs involving the musculoskeletal system  Rationale for Evaluation and Treatment: Rehabilitation  ONSET DATE: a little over a month  SUBJECTIVE:                                                                                                                                                                                                         SUBJECTIVE STATEMENT: Patient states symptoms have been a little better. Thumb numbness has been a little better today. Symptoms seem to get better and worse a few hours later. Symptoms were better for a few hours after dry needling and then goes back to noticing it again. Exercises have been alright.    EVAL: Patient states R sided neck/ periscapular pain with pain into R arm to triceps. She had a shot near scapula which  helped a little. Some tingling into thumb and index and middle finger. Massage to region helped for  a few days. The last couple of weeks it has been pretty constant. Massage now only helping for a couple of hours. Thinks it has something to do with being sedentary with cancer.   PERTINENT HISTORY:  HLD, hx cancer, hx frozen shoulder  PAIN:  Are you having pain? Yes: NPRS scale: 2/10 Pain location: R neck/shoulder Pain description: tingling, burning, stabbing, sharp Aggravating factors: sitting, computer work with mouse Relieving factors: movement  PRECAUTIONS: None  WEIGHT BEARING RESTRICTIONS: No  FALLS:  Has patient fallen in last 6 months? No  OCCUPATION: scheduling for [employer]  PLOF: Independent  PATIENT GOALS: pain relief  NEXT MD VISIT: April  OBJECTIVE:   PATIENT SURVEYS: FOTO 66% function  COGNITION: Overall cognitive status: Within functional limits for tasks assessed  SENSATION: decreased R C5  POSTURE: rounded shoulders and forward head  PALPATION: TTP R levator scapulae, UT; hyperactive and tender cervical paraspinals and suboccipitals R>L; hypomobile R and L UPA    CERVICAL ROM:   Active ROM A/PROM  eval  Flexion 0% limited  Extension 25% limited *  Right lateral flexion 75% limited *  Left lateral flexion 50% limited *  Right rotation 25% limited  Left rotation 25% limited *   (Blank rows = not tested)*=pain  UPPER EXTREMITY ROM: WFL for tasks assessed  Active ROM Right eval Left eval  Shoulder flexion    Shoulder extension    Shoulder abduction    Shoulder adduction    Shoulder extension    Shoulder internal rotation    Shoulder external rotation    Elbow flexion    Elbow extension    Wrist flexion    Wrist extension    Wrist ulnar deviation    Wrist radial deviation    Wrist pronation    Wrist supination     (Blank rows = not tested)  UPPER EXTREMITY MMT:  MMT Right eval Left eval  Shoulder flexion 4+ 4+  Shoulder  extension    Shoulder abduction 4+ 5  Shoulder adduction    Shoulder extension    Shoulder internal rotation 5 5  Shoulder external rotation 4+ 4+  Middle trapezius    Lower trapezius    Elbow flexion 5 5  Elbow extension 5 5  Wrist flexion 5 5  Wrist extension 4 5  Wrist ulnar deviation    Wrist radial deviation    Wrist pronation    Wrist supination    Grip strength decreased WFL   (Blank rows = not tested)  CERVICAL SPECIAL TESTS:  Distraction test: Positive   TODAY'S TREATMENT:                                                                                                                              DATE:  06/21/22 Seated scapular retractions 2 x 10   Seated cervical retractions 2 x 10  Seated UT stretch 3 x 30 second holds for R Median nerve glides 1x  10 Radial nerve glides in standing 1 x 10 Standing Row RTB 2 x 10  Shoulder extension RTB 2 x 10   06/14/22 Manual: STM to R UT pre and post dry needling for trigger point identification and muscular relaxation.  Trigger Point Dry-Needling  Treatment instructions: Expect mild to moderate muscle soreness. S/S of pneumothorax if dry needled over a lung field, and to seek immediate medical attention should they occur. Patient verbalized understanding of these instructions and education.  Patient Consent Given: Yes Education handout provided: Previously provided Muscles treated: R UT in prone and supine Electrical stimulation performed: No Parameters: N/A Treatment response/outcome: twitch response elicited, decrease in symptoms   Seated scapular retractions 2 x 10   Seated cervical retractions 2 x 10  Seated UT stretch 3 x 30 second holds for R Median nerve glides 1x 10  06/07/22 Manual: STM to R UT pre and post dry needling for trigger point identification and muscular relaxation.  Trigger Point Dry-Needling  Treatment instructions: Expect mild to moderate muscle soreness. S/S of pneumothorax if dry needled over  a lung field, and to seek immediate medical attention should they occur. Patient verbalized understanding of these instructions and education.  Patient Consent Given: Yes Education handout provided: Previously provided Muscles treated: R UT in prone and supine Electrical stimulation performed: No Parameters: N/A Treatment response/outcome: twitch response elicited, decrease in symptoms   Supine cervical retractions 2 x 10 Supine scapular retractions 2x 10 Seated scapular retractions 2 x 10   Seated cervical retractions 2 x 10  Seated UT stretch 2 x 30 second holds for R  05/23/22 Supine cervical retractions 2 x 10 Supine scapular retractions 2x 10 Seated scapular retractions 2 x 10    PATIENT EDUCATION:  Education details:06/21/22: discussion of POC; 06/07/22: dry needling, HEP;  EVAL: Patient educated on exam findings, POC, scope of PT, HEP, and posture. Person educated: Patient Education method: Explanation, Demonstration, and Handouts Education comprehension: verbalized understanding, returned demonstration, verbal cues required, and tactile cues required  HOME EXERCISE PROGRAM: Access Code: [email or web address] URL: [email or web address] 06/21/22 - Standing Radial Nerve Glide  - 1 x daily - 7 x weekly - 1 sets - 10 reps - Standing Shoulder Row with Anchored Resistance  - 1 x daily - 7 x weekly - 2 sets - 10 reps - Shoulder extension with resistance - Neutral  - 1 x daily - 7 x weekly - 2 sets - 10 reps  06/14/22 - Standing Median Nerve Glide  - 2-3 x daily - 7 x weekly - 1-2 sets - 10 reps  06/07/22 - Seated Cervical Retraction  - 3 x daily - 7 x weekly - 3 sets - 10 reps - Seated Gentle Upper Trapezius Stretch  - 3 x daily - 7 x weekly - 3 reps - 20-30 second hold  Date: 05/23/2022 - Supine Chin Tuck  - 3 x daily - 7 x weekly - 2 sets - 10 reps - 2 second hold - Supine Scapular Retraction  - 3 x daily - 7 x weekly - 2 sets - 10 reps - Seated Scapular Retraction  - 3 x  daily - 7 x weekly - 2 sets - 10 reps  ASSESSMENT:  CLINICAL IMPRESSION: Discussed  POC with patient as she will begin chemo soon and f/u with oncologist on Thursday. Continued with previously completed exercises with minimal to no cueing required. Began radial nerve glides which are tolerated well. Added resisted postural strengthening and patient able to complete  with good mechanics with minimal cueing for posture. Patient will continue to benefit from physical therapy in order to improve function and reduce impairment.   OBJECTIVE IMPAIRMENTS: decreased activity tolerance, decreased endurance, decreased mobility, decreased ROM, decreased strength, hypomobility, increased muscle spasms, impaired flexibility, improper body mechanics, postural dysfunction, and pain.   ACTIVITY LIMITATIONS: carrying, lifting, bending, reach over head, and caring for others  PARTICIPATION LIMITATIONS: meal prep, cleaning, laundry, driving, shopping, community activity, occupation, and yard work  PERSONAL FACTORS: 3+ comorbidities: HLD, hx cancer, hx frozen shoulder  are also affecting patient's functional outcome.   REHAB POTENTIAL: Good  CLINICAL DECISION MAKING: Stable/uncomplicated  EVALUATION COMPLEXITY: Low   GOALS: Goals reviewed with patient? Yes  SHORT TERM GOALS: Target date: 06/13/2022    Patient will be independent with HEP in order to improve functional outcomes. Baseline: Goal status: INITIAL  2.  Patient will report at least 25% improvement in symptoms for improved quality of life. Baseline:  Goal status: INITIAL    LONG TERM GOALS: Target date: 07/04/2022   Patient will report at least 75% improvement in symptoms for improved quality of life. Baseline:  Goal status: INITIAL  2.  Patient will improve FOTO score by at least 7 points in order to indicate improved tolerance to activity. Baseline: 66% function Goal status: INITIAL  3.  Patient will demonstrate at least 25%  improvement in cervical ROM in all restricted planes for improved ability to move head while working and with chores. Baseline: see above Goal status: INITIAL  4.  Patient will be able to return to all activities unrestricted for improved ability to perform work functions and participate with family.  Baseline:  Goal status: INITIAL  5.  Patient will demonstrate grade of 5/5 MMT grade in all tested musculature as evidence of improved strength to assist with lifting.  Baseline: see above Goal status: INITIAL  PLAN:  PT FREQUENCY: 2x/week  PT DURATION: 6 weeks  PLANNED INTERVENTIONS: Therapeutic exercises, Therapeutic activity, Neuromuscular re-education, Balance training, Gait training, Patient/Family education, Joint manipulation, Joint mobilization, Stair training, Orthotic/Fit training, DME instructions, Aquatic Therapy, Dry Needling, Electrical stimulation, Spinal manipulation, Spinal mobilization, Cryotherapy, Moist heat, Compression bandaging, scar mobilization, Splintting, Taping, Traction, Ultrasound, Ionotophoresis 4mg /ml Dexamethasone, and Manual therapy  PLAN FOR NEXT SESSION: possibly continue DN, manual if indicated, f/u with nerve glides - possibly radial, postural and shoulder strength   Vianne Bulls [place], PT 06/21/2022, 7:34 AM

## 2022-06-23 ENCOUNTER — Ambulatory Visit (HOSPITAL_COMMUNITY): Payer: 59 | Admitting: Physical Therapy

## 2022-06-23 ENCOUNTER — Encounter (HOSPITAL_COMMUNITY): Payer: Self-pay | Admitting: Physical Therapy

## 2022-06-23 DIAGNOSIS — M542 Cervicalgia: Secondary | ICD-10-CM | POA: Diagnosis not present

## 2022-06-23 DIAGNOSIS — R29898 Other symptoms and signs involving the musculoskeletal system: Secondary | ICD-10-CM

## 2022-06-23 DIAGNOSIS — M6281 Muscle weakness (generalized): Secondary | ICD-10-CM

## 2022-06-23 NOTE — Therapy (Signed)
OUTPATIENT PHYSICAL THERAPY TREATMENT   Patient Name: Samantha Reynolds MRN: [id number]:[id number] DOB:August 29, 1965, 57 y.o., female Today's Date: 06/23/2022  END OF SESSION:  PT End of Session - 06/23/22 0816     Visit Number 5    Number of Visits 12    Date for PT Re-Evaluation 07/04/22    Authorization Type [employer]    PT Start Time [phone]    PT Stop Time 0854    PT Time Calculation (min) 38 min    Activity Tolerance Patient tolerated treatment well    Behavior During Therapy [facility] for tasks assessed/performed             Past Medical History:  Diagnosis Date   Anxiety    Cancer ([place]) 2013   thyroid   Endometrial polyp    Endometrial thickening on ultra sound    GERD (gastroesophageal reflux disease)    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   Worse in her 43s when she was ill   H/O Hashimoto thyroiditis    History of chemotherapy    History of radiation therapy    History of thyroid cancer no recurrence   2013--  s/p  left lobe thyroidectomy--  follicular varient papillary / lymphocyctic thyroiditis   Hyperlipidemia 11/09/2015   Hypothyroidism    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[place]) 02/26/2018   Past Surgical History:  Procedure Laterality Date   BIOPSY  02/19/2018   Procedure: BIOPSY;  Surgeon: Rogene Houston, MD;  [facility];  Service: Endoscopy;;  rectum   COLONOSCOPY N/A 08/20/2015   Procedure: COLONOSCOPY;  Surgeon: Rogene Houston, MD;  [facility];  Service: Endoscopy;  Laterality: N/A;  730   COLONOSCOPY WITH PROPOFOL N/A 07/21/2021   Procedure: COLONOSCOPY WITH PROPOFOL;  Surgeon: Rogene Houston, MD;  [facility];  Service: Endoscopy;  Laterality: N/A;  10:10   D & C HYSTEROOSCOPY W/ THERMACHOICE ENDOMETRIAL ABLATION  08-13-2004   DIVERTING ILEOSTOMY N/A 12/07/2018   Procedure: DIVERTING LOOP ILEOSTOMY;  Surgeon: Leighton Ruff, MD;  [facility]: [facility];  Service: General;  Laterality: N/A;   FLEXIBLE SIGMOIDOSCOPY N/A 02/19/2018    Procedure: FLEXIBLE SIGMOIDOSCOPY;  Surgeon: Rogene Houston, MD;  [facility];  Service: Endoscopy;  Laterality: N/A;   HYSTEROSCOPY WITH D & C N/A 04/30/2015   Procedure: DILATATION AND CURETTAGE / INTENDED HYSTEROSCOPY;  Surgeon: Dian Queen, MD;  Location: [place];  Service: Gynecology;  Laterality: N/A;   ILEOSTOMY CLOSURE N/A 04/17/2019   Procedure: LOOP ILEOSTOMY REVERSAL;  Surgeon: Leighton Ruff, MD;  [facility];  Service: General;  Laterality: N/A;   IR US GUIDE BX ASP/DRAIN  03/09/2018   LAPAROSCOPIC CHOLECYSTECTOMY  04/1996   LAPAROSCOPY N/A 12/11/2018   Procedure: LAPAROSCOPY  ILEOSTOMY REVISION AND ABDOMINAL WASHOUT;  Surgeon: Leighton Ruff, MD;  [facility];  Service: General;  Laterality: N/A;   Liver Microwave   10/17/2018   POLYPECTOMY  08/20/2015   Procedure: POLYPECTOMY;  Surgeon: Rogene Houston, MD;  [facility];  Service: Endoscopy;;  Splenic flexure polypectomy   POLYPECTOMY  02/19/2018   Procedure: POLYPECTOMY;  Surgeon: Rogene Houston, MD;  [facility];  Service: Endoscopy;;  rectum   PORTACATH PLACEMENT N/A 03/14/2018   Procedure: INSERTION PORT-A-CATH;  Surgeon: Aviva Signs, MD;  Location: [facility];  Service: General;  Laterality: N/A;   REDUCTION INCARCERATED UTERUS  06-20-2000   intrauterine preg. 13 wks /  urinary retention  THYROID LOBECTOMY  11/24/2011   Procedure: THYROID LOBECTOMY;  Surgeon: Earnstine Regal, MD;  [facility];  Service: General;  Laterality: Left;  Left Thyroid Lobectomy   TUBAL LIGATION  2002   XI ROBOTIC ASSISTED LOWER ANTERIOR RESECTION N/A 12/07/2018   Procedure: XI ROBOTIC ASSISTED LOWER ANTERIOR RESECTION, SPENIC FLEXURE IMMOBILIZATION, COLOANAL ANASTOMOSIS, RIGID PROCTOSCOPY;  Surgeon: Leighton Ruff, MD;  [facility];  Service: General;  Laterality: N/A;   Patient Active Problem List   Diagnosis Date Noted   Thrombocytopenia, unspecified ([place]) 03/31/2022    Insomnia 09/27/2021   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   H/O Hashimoto thyroiditis    Hypothyroidism    Rectal cancer metastasized to liver ([place]) 10/04/2018   Hyperbilirubinemia 04/24/2018   Abnormal EKG 03/28/2018   Severe sepsis ([place]) 03/28/2018   Sepsis due to undetermined organism ([place]) 03/27/2018   GERD (gastroesophageal reflux disease) 03/27/2018   Anxiety 03/27/2018   Lactic acidosis 03/27/2018   Hypokalemia 03/27/2018   Antineoplastic chemotherapy induced pancytopenia ([place]) 03/27/2018   Hyperglycemia 03/27/2018   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[place]) 02/26/2018   Hematochezia 01/24/2018   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   Hyperlipidemia 11/09/2015   Sciatica of right side 06/26/2012   History of thyroid cancer 04/02/2012   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   Pain in joint, ankle and foot 03/09/2011    PCP: Sallee Lange MD  REFERRING PROVIDER: Carole Civil, MD  REFERRING DIAG: M54.2 (ICD-10-CM) - Neck pain M79.10 (ICD-10-CM) - Trigger point of right side of body  THERAPY DIAG:  Cervicalgia  Muscle weakness (generalized)  Other symptoms and signs involving the musculoskeletal system  Rationale for Evaluation and Treatment: Rehabilitation  ONSET DATE: a little over a month  SUBJECTIVE:                                                                                                                                                                                                         SUBJECTIVE STATEMENT: Patient states increased R tricep pain/soreness. Thumb numbness is feeling better, just the tip is tingling. No UT symptoms today, feels like its better.   EVAL: Patient states R sided neck/ periscapular pain with pain into R arm to triceps. She had a shot near scapula which helped a little. Some tingling into thumb and index and middle finger. Massage to region helped for a few days. The last couple  of weeks it has been pretty constant. Massage now only helping for a couple of hours. Thinks it has something  to do with being sedentary with cancer.   PERTINENT HISTORY:  HLD, hx cancer, hx frozen shoulder  PAIN:  Are you having pain? Yes: NPRS scale: 2/10 Pain location: R neck/shoulder Pain description: tingling, burning, stabbing, sharp Aggravating factors: sitting, computer work with mouse Relieving factors: movement  PRECAUTIONS: None  WEIGHT BEARING RESTRICTIONS: No  FALLS:  Has patient fallen in last 6 months? No  OCCUPATION: scheduling for [employer]  PLOF: Independent  PATIENT GOALS: pain relief  NEXT MD VISIT: April  OBJECTIVE:   PATIENT SURVEYS: FOTO 66% function  COGNITION: Overall cognitive status: Within functional limits for tasks assessed  SENSATION: decreased R C5  POSTURE: rounded shoulders and forward head  PALPATION: TTP R levator scapulae, UT; hyperactive and tender cervical paraspinals and suboccipitals R>L; hypomobile R and L UPA    CERVICAL ROM:   Active ROM A/PROM  eval  Flexion 0% limited  Extension 25% limited *  Right lateral flexion 75% limited *  Left lateral flexion 50% limited *  Right rotation 25% limited  Left rotation 25% limited *   (Blank rows = not tested)*=pain  UPPER EXTREMITY ROM: WFL for tasks assessed  Active ROM Right eval Left eval  Shoulder flexion    Shoulder extension    Shoulder abduction    Shoulder adduction    Shoulder extension    Shoulder internal rotation    Shoulder external rotation    Elbow flexion    Elbow extension    Wrist flexion    Wrist extension    Wrist ulnar deviation    Wrist radial deviation    Wrist pronation    Wrist supination     (Blank rows = not tested)  UPPER EXTREMITY MMT:  MMT Right eval Left eval  Shoulder flexion 4+ 4+  Shoulder extension    Shoulder abduction 4+ 5  Shoulder adduction    Shoulder extension    Shoulder internal rotation 5 5  Shoulder  external rotation 4+ 4+  Middle trapezius    Lower trapezius    Elbow flexion 5 5  Elbow extension 5 5  Wrist flexion 5 5  Wrist extension 4 5  Wrist ulnar deviation    Wrist radial deviation    Wrist pronation    Wrist supination    Grip strength decreased WFL   (Blank rows = not tested)  CERVICAL SPECIAL TESTS:  Distraction test: Positive   TODAY'S TREATMENT:                                                                                                                              DATE:  06/23/22 Median nerve glides 1x 10 Radial nerve glides in standing 1 x 10 Standing Row RTB 3 x 10  Shoulder extension RTB 3 x 10 Scapular retraction with GH ER RTB 2 x 10  Horizontal abduction RTB 2 x 10  Shoulder PNF D2 RTB 2 x 8  06/21/22 Seated scapular retractions  2 x 10   Seated cervical retractions 2 x 10  Seated UT stretch 3 x 30 second holds for R Median nerve glides 1x 10 Radial nerve glides in standing 1 x 10 Standing Row RTB 2 x 10  Shoulder extension RTB 2 x 10   06/14/22 Manual: STM to R UT pre and post dry needling for trigger point identification and muscular relaxation.  Trigger Point Dry-Needling  Treatment instructions: Expect mild to moderate muscle soreness. S/S of pneumothorax if dry needled over a lung field, and to seek immediate medical attention should they occur. Patient verbalized understanding of these instructions and education.  Patient Consent Given: Yes Education handout provided: Previously provided Muscles treated: R UT in prone and supine Electrical stimulation performed: No Parameters: N/A Treatment response/outcome: twitch response elicited, decrease in symptoms   Seated scapular retractions 2 x 10   Seated cervical retractions 2 x 10  Seated UT stretch 3 x 30 second holds for R Median nerve glides 1x 10  06/07/22 Manual: STM to R UT pre and post dry needling for trigger point identification and muscular relaxation.  Trigger Point  Dry-Needling  Treatment instructions: Expect mild to moderate muscle soreness. S/S of pneumothorax if dry needled over a lung field, and to seek immediate medical attention should they occur. Patient verbalized understanding of these instructions and education.  Patient Consent Given: Yes Education handout provided: Previously provided Muscles treated: R UT in prone and supine Electrical stimulation performed: No Parameters: N/A Treatment response/outcome: twitch response elicited, decrease in symptoms   Supine cervical retractions 2 x 10 Supine scapular retractions 2x 10 Seated scapular retractions 2 x 10   Seated cervical retractions 2 x 10  Seated UT stretch 2 x 30 second holds for R  05/23/22 Supine cervical retractions 2 x 10 Supine scapular retractions 2x 10 Seated scapular retractions 2 x 10    PATIENT EDUCATION:  Education details: 06/23/22 HEP, returning to PT in future, POC; 06/21/22: discussion of POC; 06/07/22: dry needling, HEP;  EVAL: Patient educated on exam findings, POC, scope of PT, HEP, and posture. Person educated: Patient Education method: Explanation, Demonstration, and Handouts Education comprehension: verbalized understanding, returned demonstration, verbal cues required, and tactile cues required  HOME EXERCISE PROGRAM: Access Code: [email or web address] URL: [email or web address] 06/23/22 - Shoulder External Rotation and Scapular Retraction with Resistance  - 1 x daily - 7 x weekly - 2 sets - 10 reps - Standing Shoulder Horizontal Abduction with Resistance  - 1 x daily - 7 x weekly - 2 sets - 10 reps - Standing Shoulder Single Arm PNF D2 Flexion with Resistance  - 1 x daily - 7 x weekly - 2 sets - 8-10 reps  06/21/22 - Standing Radial Nerve Glide  - 1 x daily - 7 x weekly - 1 sets - 10 reps - Standing Shoulder Row with Anchored Resistance  - 1 x daily - 7 x weekly - 2 sets - 10 reps - Shoulder extension with resistance - Neutral  - 1 x daily - 7 x weekly - 2  sets - 10 reps  06/14/22 - Standing Median Nerve Glide  - 2-3 x daily - 7 x weekly - 1-2 sets - 10 reps  06/07/22 - Seated Cervical Retraction  - 3 x daily - 7 x weekly - 3 sets - 10 reps - Seated Gentle Upper Trapezius Stretch  - 3 x daily - 7 x weekly - 3 reps - 20-30 second hold  Date: 05/23/2022 - Supine Chin  Tuck  - 3 x daily - 7 x weekly - 2 sets - 10 reps - 2 second hold - Supine Scapular Retraction  - 3 x daily - 7 x weekly - 2 sets - 10 reps - Seated Scapular Retraction  - 3 x daily - 7 x weekly - 2 sets - 10 reps  ASSESSMENT:  CLINICAL IMPRESSION: Continued with nerve glides which are performed with good mechanics after initial cueing. Soreness symptoms likely related to DOMs from new postural strengthening exercises. Continued with postural strengthening and added resisted periscap and shoulder strengthening. Educated on correct mechanics and how to perform in gravity reduced positions if needed. Patient will continue to benefit from physical therapy in order to improve function and reduce impairment.   OBJECTIVE IMPAIRMENTS: decreased activity tolerance, decreased endurance, decreased mobility, decreased ROM, decreased strength, hypomobility, increased muscle spasms, impaired flexibility, improper body mechanics, postural dysfunction, and pain.   ACTIVITY LIMITATIONS: carrying, lifting, bending, reach over head, and caring for others  PARTICIPATION LIMITATIONS: meal prep, cleaning, laundry, driving, shopping, community activity, occupation, and yard work  PERSONAL FACTORS: 3+ comorbidities: HLD, hx cancer, hx frozen shoulder  are also affecting patient's functional outcome.   REHAB POTENTIAL: Good  CLINICAL DECISION MAKING: Stable/uncomplicated  EVALUATION COMPLEXITY: Low   GOALS: Goals reviewed with patient? Yes  SHORT TERM GOALS: Target date: 06/13/2022    Patient will be independent with HEP in order to improve functional outcomes. Baseline: Goal status:  INITIAL  2.  Patient will report at least 25% improvement in symptoms for improved quality of life. Baseline:  Goal status: INITIAL    LONG TERM GOALS: Target date: 07/04/2022   Patient will report at least 75% improvement in symptoms for improved quality of life. Baseline:  Goal status: INITIAL  2.  Patient will improve FOTO score by at least 7 points in order to indicate improved tolerance to activity. Baseline: 66% function Goal status: INITIAL  3.  Patient will demonstrate at least 25% improvement in cervical ROM in all restricted planes for improved ability to move head while working and with chores. Baseline: see above Goal status: INITIAL  4.  Patient will be able to return to all activities unrestricted for improved ability to perform work functions and participate with family.  Baseline:  Goal status: INITIAL  5.  Patient will demonstrate grade of 5/5 MMT grade in all tested musculature as evidence of improved strength to assist with lifting.  Baseline: see above Goal status: INITIAL  PLAN:  PT FREQUENCY: 2x/week  PT DURATION: 6 weeks  PLANNED INTERVENTIONS: Therapeutic exercises, Therapeutic activity, Neuromuscular re-education, Balance training, Gait training, Patient/Family education, Joint manipulation, Joint mobilization, Stair training, Orthotic/Fit training, DME instructions, Aquatic Therapy, Dry Needling, Electrical stimulation, Spinal manipulation, Spinal mobilization, Cryotherapy, Moist heat, Compression bandaging, scar mobilization, Splintting, Taping, Traction, Ultrasound, Ionotophoresis 4mg /ml Dexamethasone, and Manual therapy  PLAN FOR NEXT SESSION: possibly continue DN, manual if indicated, f/u with nerve glides - possibly radial, postural and shoulder strength   Vianne Bulls Raveena Hebdon, PT 06/23/2022, 8:16 AM

## 2022-06-27 ENCOUNTER — Encounter (HOSPITAL_COMMUNITY): Payer: 59 | Admitting: Physical Therapy

## 2022-06-29 ENCOUNTER — Encounter (HOSPITAL_COMMUNITY): Payer: 59 | Admitting: Physical Therapy

## 2022-06-29 NOTE — Progress Notes (Signed)
[place] [address] [place] [address]    Clinic Day:  06/30/2022  Referring physician: [name], MD  Patient Care Team: [name], MD as PCP - General (Family Medicine) Derek Jack, MD as Medical Oncologist (Medical Oncology)   ASSESSMENT & PLAN:   Assessment: 1.  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  Reynolds biopsy on 03/09/2018 consistent with adenocarcinoma. 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 -7 cycles of FOLFOX with vectibix completed on 06/27/2018. -XRT with Xeloda from 07/30/2018 through 09/06/2018. -Right Reynolds lesion microwave ablation on 10/15/2018 at [facility]. -Low anterior resection and diverting ileostomy on 12/07/2018, pathology [id number], 0/6 lymph nodes positive, margins negative. -Loop ileostomy reversal on 04/17/2019. - Microwave ablation of the right hepatic lobe lesion by Dr. Maudie Mercury around 08/15/2019. -SBRT to the left lower lobe lung lesion and left Reynolds lobe lesion on 12/13/2019 at [facility]. - Right upper lobe lesion SBRT from 08/06/2020 through 08/13/2020. - 3 left lung lesions and subcarinal lymph node IMRT from 09/21/2021 through 10/05/2021. - Reynolds biopsy (06/09/2022): Metastatic moderately differentiated colonic adenocarcinoma. - NGS: KRAS/NRAS/BRAF wild-type, MSI-stable, HER2-0, negative for other targetable mutations   2.  Hyperbilirubinemia: -Total bilirubin is 5.1.  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  Baseline is between 2 and 3.   3.  Hypothyroidism: -Samantha Reynolds is on Armour Thyroid.  TSH is 3.6.   4.  Weight loss: -Samantha Reynolds had 10 pound weight loss since Samantha Reynolds last visit 3 months ago.  However Samantha Reynolds had ileostomy reversal and is adjusting to new food habits.   Plan: 1.  Stage IV rectal adenocarcinoma, BRAF/RAS wild-type, MSI-stable: - We have reviewed NGS test results in detail with the patient and Samantha Reynolds husband. - We have talked about  prognosis of metastatic rectal cancer in detail. - Due to lack of mutations, Samantha Reynolds is not eligible for any biomarker directed therapy. - Hence have recommended restarting chemotherapy with FOLFOX regimen.  Since Samantha Reynolds has UG T1 A1 homozygosity, I will try to avoid Irinotecan. - Since Samantha Reynolds had low anterior resection, Samantha Reynolds has more diarrhea alternating with constipation.  Hence I would avoid using Vectibix which can potentiate diarrhea.  We will use bevacizumab instead to pair with FOLFOX.  I will start FOLFOX with 20% dose reduction.  In the past Samantha Reynolds required treatment be changed every 3 weeks based on elevated bilirubin levels. - We have rediscussed side effects in detail. - Tentatively we will start chemotherapy as soon as next week.   2.  Hyperbilirubinemia: - Samantha Reynolds has mildly elevated bilirubin due to Gilbert's syndrome.  We will dose reduce chemotherapy.   3.  Normocytic anemia: - Labs on 06/09/2022 with hemoglobin 10.9.  Will consider checking ferritin and iron panel.   4.  Leukopenia and thrombocytopenia: - Samantha Reynolds has intermittent leukopenia and mild thrombocytopenia from splenomegaly.  Will closely monitor.  5.  Right arm pain: - Samantha Reynolds reports 46-month history of right arm pain. - PET scan reviewed by me shows C6 vertebral body metabolic activity extending into the right transverse process. - Recommend MRI of the C-spine with and without contrast.  Will consider radiation for pain control.  Orders Placed This Encounter  Procedures   MR Cervical Spine W Wo Contrast    Standing Status:   Future    Standing Expiration Date:   06/30/2023    Order Specific Question:   If indicated for the ordered procedure, I authorize the administration of contrast  media per Radiology protocol    Answer:   Yes    Order Specific Question:   What is the patient's sedation requirement?    Answer:   No Sedation    Order Specific Question:   Does the patient have a pacemaker or implanted devices?    Answer:   No     Order Specific Question:   Use SRS Protocol?    Answer:   No    Order Specific Question:   Preferred imaging location?    Answer:   [facility] (table limit [phone])    Order Specific Question:   Release to patient    Answer:   Immediate   Magnesium    Standing Status:   Future    Standing Expiration Date:   07/12/2023   CBC with Differential    Standing Status:   Future    Standing Expiration Date:   07/12/2023   Comprehensive metabolic panel    Standing Status:   Future    Standing Expiration Date:   07/12/2023   Urinalysis, dipstick only    Standing Status:   Future    Standing Expiration Date:   07/12/2023   Magnesium    Standing Status:   Future    Standing Expiration Date:   07/11/2023   Magnesium    Standing Status:   Future    Standing Expiration Date:   07/26/2023   CBC with Differential    Standing Status:   Future    Standing Expiration Date:   07/26/2023   Comprehensive metabolic panel    Standing Status:   Future    Standing Expiration Date:   07/26/2023   Magnesium    Standing Status:   Future    Standing Expiration Date:   08/09/2023   CBC with Differential    Standing Status:   Future    Standing Expiration Date:   08/09/2023   Comprehensive metabolic panel    Standing Status:   Future    Standing Expiration Date:   08/09/2023   Urinalysis, dipstick only    Standing Status:   Future    Standing Expiration Date:   08/09/2023   Magnesium    Standing Status:   Future    Standing Expiration Date:   08/23/2023   CBC with Differential    Standing Status:   Future    Standing Expiration Date:   08/23/2023   Comprehensive metabolic panel    Standing Status:   Future    Standing Expiration Date:   08/23/2023      I,Katie Daubenspeck,acting as a scribe for Derek Jack, MD.,have documented all relevant documentation on the behalf of Derek Jack, MD,as directed by  Derek Jack, MD while in the presence of Derek Jack, MD.   I,  Derek Jack MD, have reviewed the above documentation for accuracy and completeness, and I agree with the above.   Derek Jack, MD   4/4/20246:26 PM  CHIEF COMPLAINT:   Diagnosis: metastatic rectal cancer to Reynolds    Cancer Staging  Malignant neoplasm of rectum Staging form: Colon and Rectum, AJCC 8th Edition - Clinical stage from 03/13/2018: Stage IVA (cT3, cN1b, cM1a) - Signed by Derek Jack, MD on 03/13/2018    Prior Therapy: 1. FOLFOX & vectibix x 7 cycles from 03/14/2018 to 06/27/2018. 2. XRT with Xeloda from 07/30/2018 to 09/06/2018. 3. Right Reynolds lesion microwave ablation on 10/15/2018. 4. Low anterior resection and diverting ileostomy on 12/07/2018. 5. SBRT to Reynolds  60 Gy in 5 fractions from 11/15/2019 to 12/13/2019.  Current Therapy: FOLFOX and bevacizumab   HISTORY OF PRESENT ILLNESS:   Oncology History  Malignant neoplasm of rectum  02/26/2018 Initial Diagnosis   Rectal cancer ([place])   03/13/2018 Cancer Staging   Staging form: Colon and Rectum, AJCC 8th Edition - Clinical stage from 03/13/2018: Stage IVA (cT3, cN1b, cM1a) - Signed by Derek Jack, MD on 03/13/2018   03/14/2018 - 06/29/2018 Chemotherapy   The patient had palonosetron (ALOXI) injection 0.25 mg, 0.25 mg, Intravenous,  Once, 7 of 8 cycles Administration: 0.25 mg (03/14/2018), 0.25 mg (03/27/2018), 0.25 mg (04/18/2018), 0.25 mg (05/02/2018), 0.25 mg (05/16/2018), 0.25 mg (06/05/2018), 0.25 mg (06/27/2018) leucovorin 800 mg in dextrose 5 % 250 mL infusion, 772 mg, Intravenous,  Once, 7 of 8 cycles Administration: 800 mg (03/14/2018), 800 mg (03/27/2018), 800 mg (05/02/2018), 800 mg (05/16/2018), 700 mg (04/18/2018), 800 mg (06/05/2018), 800 mg (06/27/2018) oxaliplatin (ELOXATIN) 165 mg in dextrose 5 % 500 mL chemo infusion, 85 mg/m2 = 165 mg, Intravenous,  Once, 6 of 7 cycles Dose modification: 68 mg/m2 (80 % of original dose 85 mg/m2, Cycle 4, Reason: Provider Judgment) Administration:  165 mg (03/14/2018), 165 mg (03/27/2018), 130 mg (05/02/2018), 130 mg (05/16/2018), 130 mg (06/05/2018), 130 mg (06/27/2018) panitumumab (VECTIBIX) 500 mg in sodium chloride 0.9 % 100 mL chemo infusion, 480 mg, Intravenous,  Once, 4 of 5 cycles Administration: 500 mg (04/18/2018), 480 mg (05/02/2018), 480 mg (05/16/2018), 480 mg (06/05/2018) fluorouracil (ADRUCIL) chemo injection 750 mg, 400 mg/m2 = 750 mg (100 % of original dose 400 mg/m2), Intravenous,  Once, 6 of 7 cycles Dose modification: 400 mg/m2 (original dose 400 mg/m2, Cycle 1), 400 mg/m2 (original dose 400 mg/m2, Cycle 3) Administration: 750 mg (03/14/2018), 750 mg (04/18/2018), 750 mg (05/02/2018), 750 mg (05/16/2018), 750 mg (06/05/2018), 750 mg (06/27/2018) fosaprepitant (EMEND) 150 mg, dexamethasone (DECADRON) 12 mg in sodium chloride 0.9 % 145 mL IVPB, , Intravenous,  Once, 4 of 5 cycles Administration:  (05/02/2018),  (05/16/2018),  (06/05/2018),  (06/27/2018) fluorouracil (ADRUCIL) 4,650 mg in sodium chloride 0.9 % 57 mL chemo infusion, 2,400 mg/m2 = 4,650 mg, Intravenous, 1 Day/Dose, 7 of 8 cycles Administration: 4,650 mg (03/14/2018), 4,650 mg (03/27/2018), 4,650 mg (04/18/2018), 4,650 mg (05/02/2018), 4,650 mg (05/16/2018), 4,650 mg (06/05/2018), 4,650 mg (06/27/2018)  for chemotherapy treatment.    07/12/2022 -  Chemotherapy   Patient is on Treatment Plan : COLORECTAL FOLFOX + Bevacizumab q14d        INTERVAL HISTORY:   Samantha Reynolds is a 57 y.o. female presenting to clinic today for follow up of metastatic rectal cancer to Reynolds . Samantha Reynolds was last seen by me on 05/31/22.  Since Samantha Reynolds last visit, Samantha Reynolds underwent left Reynolds biopsy on 06/09/22. Pathology confirmed metastatic moderately differentiated colonic adenocarcinoma.  Today, Samantha Reynolds states that Samantha Reynolds is doing well overall. Samantha Reynolds appetite level is at 75%. Samantha Reynolds energy level is at 85%.  PAST MEDICAL HISTORY:   Past Medical History: Past Medical History:  Diagnosis Date   Anxiety    Cancer ([place]) 2013   thyroid    Endometrial polyp    Endometrial thickening on ultra sound    GERD (gastroesophageal reflux disease)    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   Worse in Samantha Reynolds 26s when Samantha Reynolds was ill   H/O Hashimoto thyroiditis    History of chemotherapy    History of radiation therapy    History of thyroid cancer no recurrence   2013--  s/p  left lobe thyroidectomy--  follicular varient papillary / lymphocyctic thyroiditis   Hyperlipidemia 11/09/2015   Hypothyroidism    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[place]) 02/26/2018    Surgical History: Past Surgical History:  Procedure Laterality Date   BIOPSY  02/19/2018   Procedure: BIOPSY;  Surgeon: Rogene Houston, MD;  [facility];  Service: Endoscopy;;  rectum   COLONOSCOPY N/A 08/20/2015   Procedure: COLONOSCOPY;  Surgeon: Rogene Houston, MD;  [facility];  Service: Endoscopy;  Laterality: N/A;  730   COLONOSCOPY WITH PROPOFOL N/A 07/21/2021   Procedure: COLONOSCOPY WITH PROPOFOL;  Surgeon: Rogene Houston, MD;  [facility];  Service: Endoscopy;  Laterality: N/A;  10:10   D & C HYSTEROOSCOPY W/ THERMACHOICE ENDOMETRIAL ABLATION  08-13-2004   DIVERTING ILEOSTOMY N/A 12/07/2018   Procedure: DIVERTING LOOP ILEOSTOMY;  Surgeon: Leighton Ruff, MD;  [facility];  Service: General;  Laterality: N/A;   FLEXIBLE SIGMOIDOSCOPY N/A 02/19/2018   Procedure: FLEXIBLE SIGMOIDOSCOPY;  Surgeon: Rogene Houston, MD;  [facility];  Service: Endoscopy;  Laterality: N/A;   HYSTEROSCOPY WITH D & C N/A 04/30/2015   Procedure: DILATATION AND CURETTAGE / INTENDED HYSTEROSCOPY;  Surgeon: Dian Queen, MD;  Location: [facility];  Service: Gynecology;  Laterality: N/A;   ILEOSTOMY CLOSURE N/A 04/17/2019   Procedure: LOOP ILEOSTOMY REVERSAL;  Surgeon: Leighton Ruff, MD;  [facility];  Service: General;  Laterality: N/A;   IR US GUIDE BX ASP/DRAIN  03/09/2018   LAPAROSCOPIC CHOLECYSTECTOMY  04/1996   LAPAROSCOPY N/A 12/11/2018    Procedure: LAPAROSCOPY  ILEOSTOMY REVISION AND ABDOMINAL WASHOUT;  Surgeon: Leighton Ruff, MD;  [facility];  Service: General;  Laterality: N/A;   Reynolds Microwave   10/17/2018   POLYPECTOMY  08/20/2015   Procedure: POLYPECTOMY;  Surgeon: Rogene Houston, MD;  [facility];  Service: Endoscopy;;  Splenic flexure polypectomy   POLYPECTOMY  02/19/2018   Procedure: POLYPECTOMY;  Surgeon: Rogene Houston, MD;  [facility];  Service: Endoscopy;;  rectum   PORTACATH PLACEMENT N/A 03/14/2018   Procedure: INSERTION PORT-A-CATH;  Surgeon: Aviva Signs, MD;  Location: [facility];  Service: General;  Laterality: N/A;   REDUCTION INCARCERATED UTERUS  06-20-2000   intrauterine preg. 13 wks /  urinary retention   THYROID LOBECTOMY  11/24/2011   Procedure: THYROID LOBECTOMY;  Surgeon: Earnstine Regal, MD;  [facility];  Service: General;  Laterality: Left;  Left Thyroid Lobectomy   TUBAL LIGATION  2002   XI ROBOTIC ASSISTED LOWER ANTERIOR RESECTION N/A 12/07/2018   Procedure: XI ROBOTIC ASSISTED LOWER ANTERIOR RESECTION, SPENIC FLEXURE IMMOBILIZATION, COLOANAL ANASTOMOSIS, RIGID PROCTOSCOPY;  Surgeon: Leighton Ruff, MD;  [facility];  Service: General;  Laterality: N/A;    Social History: Social History   Socioeconomic History   Marital status: Married    Spouse name: Not on file   Number of children: 4   Years of education: Not on file   Highest education level: Not on file  Occupational History    Comment: Marketing executive at [employer] Use   Smoking status: Former    Packs/day: 0.25    Years: 10.00    Additional pack years: 0.00    Total pack years: 2.50    Types: Cigarettes    Quit date: 10/31/1991    Years since quitting: 30.6   Smokeless tobacco: Never  Vaping Use   Vaping Use: Never used  Substance and Sexual Activity  Alcohol use: No   Drug use: No   Sexual activity: Not Currently  Other Topics Concern   Not on file  Social History Narrative    Lives with husband Samantha Reynolds and 51 year old son [name]   Husband is Interior and spatial designer of [employer]   Social Determinants of Health   Financial Resource Strain: Low Risk  (02/26/2018)   Overall Financial Resource Strain (CARDIA)    Difficulty of Paying Living Expenses: Not hard at all  Food Insecurity: No Food Insecurity (02/26/2018)   Hunger Vital Sign    Worried About Running Out of Food in the Last Year: Never true    [place] in the Last Year: Never true  Transportation Needs: No Transportation Needs (02/26/2018)   PRAPARE - Hydrologist (Medical): No    Lack of Transportation (Non-Medical): No  Physical Activity: Inactive (02/26/2018)   Exercise Vital Sign    Days of Exercise per Week: 0 days    Minutes of Exercise per Session: 0 min  Stress: Stress Concern Present (02/26/2018)   [place]    Feeling of Stress : To some extent  Social Connections: Socially Integrated (02/26/2018)   Social Connection and Isolation Panel [NHANES]    Frequency of Communication with Friends and Family: More than three times a week    Frequency of Social Gatherings with Friends and Family: Twice a week    Attends Religious Services: More than 4 times per year    Active Member of [employer] or Organizations: Yes    Attends Music therapist: More than 4 times per year    Marital Status: Married  Human resources officer Violence: Not At Risk (02/26/2018)   Humiliation, Afraid, Rape, and Kick questionnaire    Fear of Current or Ex-Partner: No    Emotionally Abused: No    Physically Abused: No    Sexually Abused: No    Family History: Family History  Problem Relation Age of Onset   Hypertension Mother    Hypertension Father    Prostate cancer Father    Cancer Maternal Aunt        spine/back   Cancer Paternal Grandfather        lung   Healthy Son    Healthy Son    Healthy Son    Healthy Son    Colon  cancer Neg Hx     Current Medications:  Current Outpatient Medications:    ALPRAZolam (XANAX) 0.5 MG tablet, Take 1 tablet (0.5 mg total) by mouth 3 (three) times daily as needed for anxiety., Disp: 90 tablet, Rfl: 5   amitriptyline (ELAVIL) 10 MG tablet, Take 1 tablet (10 mg total) by mouth 2 (two) times daily., Disp: 180 tablet, Rfl: 3   ARMOUR THYROID 60 MG tablet, Take 1 tablet (60 mg total) by mouth daily., Disp: 90 tablet, Rfl: 1   ARMOUR THYROID 60 MG tablet, Take 1 tablet (60 mg total) by mouth daily., Disp: 90 tablet, Rfl: 1   loperamide (IMODIUM) 2 MG capsule, Take 1 capsule (2 mg total) by mouth 3 (three) times daily. (Patient taking differently: Take 2 mg by mouth daily.), Disp: 30 capsule, Rfl: 0   predniSONE (STERAPRED UNI-PAK 48 TAB) 10 MG (48) TBPK tablet, Take as directed, Disp: 48 tablet, Rfl: 0   pregabalin (LYRICA) 25 MG capsule, Take 1 capsule (25 mg total) by mouth 2 (two) times daily., Disp: 60 capsule,  Rfl: 3   prochlorperazine (COMPAZINE) 10 MG tablet, Take 1 tablet (10 mg total) by mouth every 6 (six) hours as needed for nausea or vomiting., Disp: 60 tablet, Rfl: 3   tiZANidine (ZANAFLEX) 4 MG tablet, Take 1 tablet (4 mg total) by mouth every 6 (six) hours as needed for muscle spasms (take for tightness and pain in the scapular region)., Disp: 56 tablet, Rfl: 2 No current facility-administered medications for this visit.  Facility-Administered Medications Ordered in Other Visits:    clindamycin (CLEOCIN) 900 mg in dextrose 5 % 50 mL IVPB, 900 mg, Intravenous, 60 min Pre-Op **AND** gentamicin (GARAMYCIN) 350 mg in dextrose 5 % 50 mL IVPB, 5 mg/kg, Intravenous, 60 min Pre-Op, Leighton Ruff, MD   clindamycin (CLEOCIN) 900 mg in dextrose 5 % 50 mL IVPB, 900 mg, Intravenous, 60 min Pre-Op **AND** gentamicin (GARAMYCIN) 5 mg/kg in dextrose 5 % 50 mL IVPB, 5 mg/kg, Intravenous, 60 min Pre-Op, Leighton Ruff, MD   sodium chloride 0.9 % 1,000 mL with potassium chloride 20 mEq,  magnesium sulfate 2 g infusion, , Intravenous, Once, Derek Jack, MD   Allergies: Allergies  Allergen Reactions   Demerol [Meperidine] Itching    All over the body.   Penicillins Hives     childhood, does not remember if it spread all over the body or not. Did it involve swelling of the face/tongue/throat, SOB, or low BP? No Did it involve sudden or severe rash/hives, skin peeling, or any reaction on the inside of your mouth or nose? Yes Did you need to seek medical attention at a hospital or doctor's office? Yes When did it last happen?      childhood allergy If all above answers are "NO", may proceed with cephalosporin use.    Levaquin [Levofloxacin] Other (See Comments)    Patient has Aortic Aneurysm and is not indicated with this diagnosis   Other Hives and Other (See Comments)    Fresh coconut    Vancomycin Rash    Will need Benadryl prior to administration per IV. "Red man syndrome"    REVIEW OF SYSTEMS:   Review of Systems  Constitutional:  Negative for chills, fatigue and fever.  HENT:   Negative for lump/mass, mouth sores, nosebleeds, sore throat and trouble swallowing.   Eyes:  Negative for eye problems.  Respiratory:  Negative for cough and shortness of breath.   Cardiovascular:  Negative for chest pain, leg swelling and palpitations.  Gastrointestinal:  Negative for abdominal pain, constipation, diarrhea, nausea and vomiting.  Genitourinary:  Negative for bladder incontinence, difficulty urinating, dysuria, frequency, hematuria and nocturia.   Musculoskeletal:  Negative for arthralgias, back pain, flank pain, myalgias and neck pain.       Right arm pain  Skin:  Negative for itching and rash.  Neurological:  Positive for numbness. Negative for dizziness and headaches.  Hematological:  Does not bruise/bleed easily.  Psychiatric/Behavioral:  Positive for depression. Negative for sleep disturbance and suicidal ideas. The patient is nervous/anxious.   All other  systems reviewed and are negative.    VITALS:   Blood pressure 125/84, pulse (!) 112, temperature 98.9 F (37.2 C), temperature source Tympanic, resp. rate 19, height 5' 6.5" (1.689 m), weight 134 lb (60.8 kg), last menstrual period 02/16/2015, SpO2 100 %.  Wt Readings from Last 3 Encounters:  06/30/22 134 lb (60.8 kg)  06/09/22 140 lb (63.5 kg)  05/31/22 139 lb 6.4 oz (63.2 kg)    Body mass index is 21.3 kg/m.  Performance  status (ECOG): 0 - Asymptomatic  PHYSICAL EXAM:   Physical Exam Vitals and nursing note reviewed. Exam conducted with a chaperone present.  Constitutional:      Appearance: Normal appearance.  Cardiovascular:     Rate and Rhythm: Normal rate and regular rhythm.     Pulses: Normal pulses.     Heart sounds: Normal heart sounds.  Pulmonary:     Effort: Pulmonary effort is normal.     Breath sounds: Normal breath sounds.  Abdominal:     Palpations: Abdomen is soft. There is no hepatomegaly, splenomegaly or mass.     Tenderness: There is no abdominal tenderness.  Musculoskeletal:     Right lower leg: No edema.     Left lower leg: No edema.  Lymphadenopathy:     Cervical: No cervical adenopathy.     Right cervical: No superficial, deep or posterior cervical adenopathy.    Left cervical: No superficial, deep or posterior cervical adenopathy.     Upper Body:     Right upper body: No supraclavicular or axillary adenopathy.     Left upper body: No supraclavicular or axillary adenopathy.  Neurological:     General: No focal deficit present.     Mental Status: Samantha Reynolds is alert and oriented to person, place, and time.  Psychiatric:        Mood and Affect: Mood normal.        Behavior: Behavior normal.     LABS:      Latest Ref Rng & Units 06/09/2022    8:54 AM 05/11/2022    2:00 PM 01/04/2022    1:52 PM  CBC  WBC 4.0 - 10.5 K/uL 4.2  3.1  3.0   Hemoglobin 12.0 - 15.0 g/dL 10.9  12.3  10.4   Hematocrit 36.0 - 46.0 % 31.4  35.3  29.8   Platelets 150 - 400  K/uL 106  117  108       Latest Ref Rng & Units 05/11/2022    2:00 PM 01/04/2022    1:52 PM 08/02/2021    2:04 PM  CMP  Glucose 70 - 99 mg/dL 95  95  97   BUN 6 - 20 mg/dL 17  16  14    Creatinine 0.44 - 1.00 mg/dL 0.91  0.78  0.93   Sodium 135 - 145 mmol/L 135  138  136   Potassium 3.5 - 5.1 mmol/L 3.4  3.9  3.5   Chloride 98 - 111 mmol/L 100  106  103   CO2 22 - 32 mmol/L 25  26  26    Calcium 8.9 - 10.3 mg/dL 8.6  8.5  8.9   Total Protein 6.5 - 8.1 g/dL 7.3  6.8  7.7   Total Bilirubin 0.3 - 1.2 mg/dL 2.5  2.2  2.1   Alkaline Phos 38 - 126 U/L 131  100  78   AST 15 - 41 U/L 21  19  21    ALT 0 - 44 U/L 12  11  14       Lab Results  Component Value Date   CEA1 20.1 (H) 05/11/2022   /  CEA  Date Value Ref Range Status  05/11/2022 20.1 (H) 0.0 - 4.7 ng/mL Final    Comment:    (NOTE)                             Nonsmokers          <  3.9                             Smokers             <5.6 Roche [employer]ostics Electrochemiluminescence Immunoassay (ECLIA) Values obtained with different assay methods or kits cannot be used interchangeably.  Results cannot be interpreted as absolute evidence of the presence or absence of malignant disease. Performed At: [facility] [place], [place] [id number] Rush Farmer MD [id number]    No results found for: "PSA1" No results found for: [phone]" No results found for: "CAN125"  No results found for: "TOTALPROTELP", "ALBUMINELP", "A1GS", "A2GS", "BETS", "BETA2SER", "GAMS", "MSPIKE", "SPEI" Lab Results  Component Value Date   TIBC 364 05/11/2022   TIBC 385 11/09/2020   FERRITIN 61 05/11/2022   FERRITIN 54 11/09/2020   IRONPCTSAT 26 05/11/2022   IRONPCTSAT 22 11/09/2020   Lab Results  Component Value Date   LDH 154 01/20/2020     STUDIES:   CT Reynolds MASS BIOPSY  Result Date: 06/09/2022 CLINICAL DATA:  History of metastatic colorectal carcinoma with imaging evidence recurrence in the left lobe of the Reynolds  immediately superior to a previously treated left lobe Reynolds lesion. EXAM: CT GUIDED CORE BIOPSY OF LEFT LOBE Reynolds MASS ANESTHESIA/SEDATION: Moderate (conscious) sedation was employed during this procedure. A total of Versed 2.0 mg and Fentanyl 100 mcg was administered intravenously by radiology nursing. Moderate Sedation Time: 23 minutes. The patient's level of consciousness and vital signs were monitored continuously by radiology nursing throughout the procedure under my direct supervision. PROCEDURE: The procedure risks, benefits, and alternatives were explained to the patient. Questions regarding the procedure were encouraged and answered. The patient understands and consents to the procedure. A time-out was performed prior to initiating the procedure. CT was performed through the Reynolds in a supine position. The anterior abdominal wall was prepped with chlorhexidine in a sterile fashion, and a sterile drape was applied covering the operative field. A sterile gown and sterile gloves were used for the procedure. Local anesthesia was provided with 1% Lidocaine. A 17 gauge needle was advanced from an anterior approach into the left lobe of the Reynolds. After confirming needle tip position, coaxial 18 gauge core biopsy samples were obtained. Four total samples were obtained and submitted in formalin. Gel-Foam pledgets were advanced through the outer needle as the needle was retracted and removed. Additional CT was performed. RADIATION DOSE REDUCTION: This exam was performed according to the departmental dose-optimization program which includes automated exposure control, adjustment of the mA and/or kV according to patient size and/or use of iterative reconstruction technique. COMPLICATIONS: None FINDINGS: The area of positive hypermetabolic activity on the PET scan within the left lobe of the Reynolds immediately superior to previously placed fiducial markers at the level of a previously treated metastatic lesion by SBRT  was targeted by CT guidance and demonstrates vague decreased attenuation by unenhanced CT over a region measuring roughly 2 x 3 cm on axial images. Solid tissue was obtained from this area. IMPRESSION: CT-guided core biopsy performed at the level of increased activity by PET within the left lobe of the Reynolds superior to the previously treated left lobe metastasis. Electronically Signed   By: Aletta Edouard M.D.   On: 06/09/2022 11:47

## 2022-06-30 ENCOUNTER — Encounter (HOSPITAL_COMMUNITY): Payer: 59 | Admitting: Physical Therapy

## 2022-06-30 ENCOUNTER — Inpatient Hospital Stay (HOSPITAL_BASED_OUTPATIENT_CLINIC_OR_DEPARTMENT_OTHER): Payer: 59 | Admitting: Hematology

## 2022-06-30 ENCOUNTER — Encounter (HOSPITAL_COMMUNITY): Payer: Self-pay

## 2022-06-30 ENCOUNTER — Ambulatory Visit: Payer: 59 | Admitting: Family Medicine

## 2022-06-30 ENCOUNTER — Encounter: Payer: Self-pay | Admitting: Hematology

## 2022-06-30 VITALS — BP 125/84 | HR 112 | Temp 98.9°F | Resp 19 | Ht 66.5 in | Wt 134.0 lb

## 2022-06-30 DIAGNOSIS — Z923 Personal history of irradiation: Secondary | ICD-10-CM | POA: Insufficient documentation

## 2022-06-30 DIAGNOSIS — Z881 Allergy status to other antibiotic agents status: Secondary | ICD-10-CM | POA: Insufficient documentation

## 2022-06-30 DIAGNOSIS — R197 Diarrhea, unspecified: Secondary | ICD-10-CM | POA: Insufficient documentation

## 2022-06-30 DIAGNOSIS — Z8042 Family history of malignant neoplasm of prostate: Secondary | ICD-10-CM | POA: Insufficient documentation

## 2022-06-30 DIAGNOSIS — K5900 Constipation, unspecified: Secondary | ICD-10-CM | POA: Insufficient documentation

## 2022-06-30 DIAGNOSIS — C20 Malignant neoplasm of rectum: Secondary | ICD-10-CM | POA: Diagnosis not present

## 2022-06-30 DIAGNOSIS — Z79899 Other long term (current) drug therapy: Secondary | ICD-10-CM | POA: Insufficient documentation

## 2022-06-30 DIAGNOSIS — Z87891 Personal history of nicotine dependence: Secondary | ICD-10-CM | POA: Insufficient documentation

## 2022-06-30 DIAGNOSIS — Z5111 Encounter for antineoplastic chemotherapy: Secondary | ICD-10-CM | POA: Insufficient documentation

## 2022-06-30 DIAGNOSIS — E039 Hypothyroidism, unspecified: Secondary | ICD-10-CM | POA: Insufficient documentation

## 2022-06-30 DIAGNOSIS — Z801 Family history of malignant neoplasm of trachea, bronchus and lung: Secondary | ICD-10-CM | POA: Insufficient documentation

## 2022-06-30 DIAGNOSIS — Z510 Encounter for antineoplastic radiation therapy: Secondary | ICD-10-CM | POA: Insufficient documentation

## 2022-06-30 DIAGNOSIS — R162 Hepatomegaly with splenomegaly, not elsewhere classified: Secondary | ICD-10-CM | POA: Insufficient documentation

## 2022-06-30 DIAGNOSIS — Z9049 Acquired absence of other specified parts of digestive tract: Secondary | ICD-10-CM | POA: Insufficient documentation

## 2022-06-30 DIAGNOSIS — C7951 Secondary malignant neoplasm of bone: Secondary | ICD-10-CM | POA: Insufficient documentation

## 2022-06-30 DIAGNOSIS — Z79630 Long term (current) use of alkylating agent: Secondary | ICD-10-CM | POA: Insufficient documentation

## 2022-06-30 DIAGNOSIS — Z808 Family history of malignant neoplasm of other organs or systems: Secondary | ICD-10-CM | POA: Insufficient documentation

## 2022-06-30 DIAGNOSIS — Z79631 Long term (current) use of antimetabolite agent: Secondary | ICD-10-CM | POA: Insufficient documentation

## 2022-06-30 DIAGNOSIS — Z5112 Encounter for antineoplastic immunotherapy: Secondary | ICD-10-CM | POA: Insufficient documentation

## 2022-06-30 DIAGNOSIS — Z8249 Family history of ischemic heart disease and other diseases of the circulatory system: Secondary | ICD-10-CM | POA: Insufficient documentation

## 2022-06-30 DIAGNOSIS — Z885 Allergy status to narcotic agent status: Secondary | ICD-10-CM | POA: Insufficient documentation

## 2022-06-30 DIAGNOSIS — Z9851 Tubal ligation status: Secondary | ICD-10-CM | POA: Insufficient documentation

## 2022-06-30 DIAGNOSIS — C787 Secondary malignant neoplasm of liver and intrahepatic bile duct: Secondary | ICD-10-CM | POA: Insufficient documentation

## 2022-06-30 DIAGNOSIS — D72819 Decreased white blood cell count, unspecified: Secondary | ICD-10-CM | POA: Insufficient documentation

## 2022-06-30 DIAGNOSIS — R634 Abnormal weight loss: Secondary | ICD-10-CM | POA: Insufficient documentation

## 2022-06-30 DIAGNOSIS — Z85850 Personal history of malignant neoplasm of thyroid: Secondary | ICD-10-CM | POA: Insufficient documentation

## 2022-06-30 DIAGNOSIS — D649 Anemia, unspecified: Secondary | ICD-10-CM | POA: Insufficient documentation

## 2022-06-30 DIAGNOSIS — M79601 Pain in right arm: Secondary | ICD-10-CM | POA: Insufficient documentation

## 2022-06-30 DIAGNOSIS — Z9221 Personal history of antineoplastic chemotherapy: Secondary | ICD-10-CM | POA: Insufficient documentation

## 2022-06-30 NOTE — Progress Notes (Signed)
DISCONTINUE ON PATHWAY REGIMEN - Colorectal     A cycle is every 14 days:     Bevacizumab-xxxx      Irinotecan      Oxaliplatin      Leucovorin      5-Fluorouracil   **Always confirm dose/schedule in your pharmacy ordering system**  REASON: Other Reason PRIOR TREATMENT: [id number]: Neoadjuvant FOLFOXIRI + Bevacizumab q14 Days (Number of Cycles to be Determined by Surgeon) TREATMENT RESPONSE: Partial Response (PR)    Patient Characteristics: Distant Metastases Tumor Location: Rectal Therapeutic Status: Distant Metastases

## 2022-06-30 NOTE — Progress Notes (Signed)
START ON PATHWAY REGIMEN - Colorectal     A cycle is every 14 days:     Bevacizumab-xxxx      Oxaliplatin      Leucovorin      Fluorouracil      Fluorouracil   **Always confirm dose/schedule in your pharmacy ordering system**  Patient Characteristics: Distant Metastases, Nonsurgical Candidate, KRAS/NRAS Wild-Type (BRAF V600 Wild-Type/Unknown), Standard Cytotoxic Therapy, Second Line Standard Cytotoxic Therapy, Bevacizumab Eligible Tumor Location: Rectal Therapeutic Status: Distant Metastases Microsatellite/Mismatch Repair Status: MSS/pMMR BRAF Mutation Status: Wild-Type (no mutation) KRAS/NRAS Mutation Status: Wild-Type (no mutation) Preferred Therapy Approach: Standard Cytotoxic Therapy Standard Cytotoxic Line of Therapy: Second Line Standard Cytotoxic Therapy Bevacizumab Eligibility: Eligible Intent of Therapy: Non-Curative / Palliative Intent, Discussed with Patient

## 2022-06-30 NOTE — Patient Instructions (Addendum)
[facility]  Discharge Instructions  You were seen and examined by Dr. Delton Coombes.  Dr. Delton Coombes has reviewed your most recent PET scan which revealed spread of cancer to the liver, lung and bones. Dr. Delton Coombes has recommended a MRI of your Cervical Spine due to the new bone lesion.  On your recent Next Generation Sequencing test, there is no new treatment options.  Dr. Delton Coombes has recommended starting on FOLFOX and Avastin. You previously got FOLFOX and Vectibix. Avastin is typically better tolerated than Vectibix.  Because this cancer is widespread, the cancer is not curative. You will likely be on some form of treatment for the remainder of your life. You will be on maintenance therapy for as long as it keeps the cancer controlled.  FOLFOX and Avastin is given once every 2 weeks here in the [facility].  You will see Dr. Raliegh Ip with your second cycle.  Thank you for choosing [facility] to provide your oncology and hematology care.   To afford each patient quality time with our provider, please arrive at least 15 minutes before your scheduled appointment time. You may need to reschedule your appointment if you arrive late (10 or more minutes). Arriving late affects you and other patients whose appointments are after yours.  Also, if you miss three or more appointments without notifying the office, you may be dismissed from the clinic at the provider's discretion.    Again, thank you for choosing [facility].  Our hope is that these requests will decrease the amount of time that you wait before being seen by our physicians.   If you have a lab appointment with the [facility] - please note that after April 8th, all labs will be drawn in the cancer center.  You do not have to check in or register with the main entrance as you have in the past but will complete your check-in at the cancer center.             _____________________________________________________________  Should you have questions after your visit to [facility], please contact our office at [phone] and follow the prompts.  Our office hours are 8:00 a.m. to 4:30 p.m. Monday - Thursday and 8:00 a.m. to 2:30 p.m. Friday.  Please note that voicemails left after 4:00 p.m. may not be returned until the following business day.  We are closed weekends and all major holidays.  You do have access to a nurse 24-7, just call the main number to the clinic [phone] and do not press any options, hold on the line and a nurse will answer the phone.    For prescription refill requests, have your pharmacy contact our office and allow 72 hours.    Masks are no longer required in the cancer centers. If you would like for your care team to wear a mask while they are taking care of you, please let them know. You may have one support person who is at least 59 years old accompany you for your appointments.

## 2022-07-01 ENCOUNTER — Ambulatory Visit (HOSPITAL_COMMUNITY)
Admission: RE | Admit: 2022-07-01 | Discharge: 2022-07-01 | Disposition: A | Discharge status: Home / Self Care | Payer: 59 | Source: Ambulatory Visit | Attending: Hematology | Admitting: Hematology

## 2022-07-01 ENCOUNTER — Other Ambulatory Visit: Payer: Self-pay

## 2022-07-01 DIAGNOSIS — M542 Cervicalgia: Secondary | ICD-10-CM | POA: Diagnosis not present

## 2022-07-01 DIAGNOSIS — C20 Malignant neoplasm of rectum: Secondary | ICD-10-CM | POA: Diagnosis not present

## 2022-07-01 DIAGNOSIS — M4312 Spondylolisthesis, cervical region: Secondary | ICD-10-CM | POA: Diagnosis not present

## 2022-07-01 DIAGNOSIS — R200 Anesthesia of skin: Secondary | ICD-10-CM | POA: Diagnosis not present

## 2022-07-01 MED ORDER — GADOBUTROL 1 MMOL/ML IV SOLN
7.0000 mL | Freq: Once | INTRAVENOUS | Status: AC | PRN
  Administered 2022-07-01: 7 mL via INTRAVENOUS

## 2022-07-04 ENCOUNTER — Encounter (HOSPITAL_COMMUNITY): Payer: 59 | Admitting: Physical Therapy

## 2022-07-04 NOTE — Progress Notes (Signed)
Radiation Oncology         [phone] ________________________________  Outpatient Re-Consultation - Conducted via telephone at patient request.  I spoke with the patient to conduct this consult visit via telephone. The patient was notified in advance and was offered an in person or telemedicine meeting to allow for face to face communication but instead preferred to proceed with a telephone consult.      Name: Samantha Reynolds MRN: [id number]  Date of Service: 07/07/2022  DOB: [birth year]   Diagnosis:  Progressive Metastatic Adenocarcinoma of the rectum.  Interval Since Last Radiation:    09/21/2021 through 10/05/2021 Ultrahypofractionated  Treatment Site Technique Total Dose (Gy) Dose per Fx (Gy) Completed Fx Beam Energies  Lung, Left: Multifocal Left lung and Subcarinal node IMRT 50/50 5 10/10 6XFFF    08/06/20-08/13/20:  SBRT Treatment The tumor in the RUL was treated with a course of stereotactic body radiation treatment. The patient received 54 Gy In 3 fractions at 18 G per fraction.  12/04/2019-12/13/2019: SBRT Treatment The patient was treated to the liver with a course of stereotactic body radiation treatment.  The patient received 60 Gray in 5 fractions using a SBRT/IMRT technique, with 3 fields.  11/15/19 - 11/22/19:  SBRT Treatment The patient was treated to the left lower lobe with a course of stereotactic body radiation treatment.  The patient received 54 Gray in 3 fractions using a SBRT/IMRT technique, with 3 fields.  07/30/2018-09/06/2018: The rectum was treated to 45 Gy in 25 fractions, and 5.4 Gy boost in 3 fractions  Narrative:  The patient is a delightful 57 y.o. 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 Given her good performance status, she received total neoadjuvant chemotherapy and completed this in April 2020 followed by chemoRT in the spring of 2020. She subsequently underwent robotic LAR with loop ileostomy in September 2020 with  2 cm of residual disease but negative nodes and margins. She had reversal of the ileostomy in January 2021, also microwave ablation to a right hepatic lesion [facility] in May 2021. She had another lesion that was difficult to access for ablation. In the interim of additional imaging, a lesion in the left lung was noted and treated with SBRT in August 2021. She also was able to undergo SBRT to the liver lesion in September 2021.  She developed additional lung disease and was treated to a RUL target in the lung in May of 2022. She received ultrahypofractionated treatment to multifocal areas in the left lung and subcarinal station in July 2023. She was recently found to have progressed disease on CT imaging in February 2024 with concerns for an increase in infrahilar consolidation, left upper lobe disease and stable hepatic lesions.  A PET scan was recommended and performed on 05/26/2022 there was new hypermetabolic activity within the C6 vertebral body with an SUV of 7.5, no residual hypermetabolism in the left lung or subcarinal station.  Low activity was seen with an SUV of 2.7 and a right upper lobe nodule measuring 1.9 cm.  Interval enlargement however was seen to her right adrenal gland with a SUV of 7.9 and the lesion measuring 3 cm.  Intense metabolic activity in the left hepatic lobe with an SUV of 7.9 was also noted but difficult to define the lesion on noncontrasted CT.  Given the concerns for progressive disease a CT-guided biopsy of the liver was performed on 06/09/2022 this confirmed active metastatic malignancy consistent with her  rectal primary.  An MRI of the cervical spine on 07/01/2022 was also performed and confirmed a metastatic lesion in the C6 vertebral body extending to the right posterior elements with extraosseous tumor along with posterior endplate extending into the right C5-C6 and C6-C7 neural foramen.  No other evidence of canal stenosis or cord compression was appreciated.  Given  this finding, she is seen to consider palliative radiation and will begin FOLFOXIRI with Avastin.  On review of systems, the patient reports she has been having pain from the base of her right neck that can extend into the right supraclavicular and right shoulder, arm all the way down to her thumb. No other complaints are noted. She finds that Lyrica is minimally helpful, and sometimes NSAIDs also help. No other complaints are verbalized.   PAST MEDICAL HISTORY:  Past Medical History:  Diagnosis Date   Anxiety    Cancer (HCC) 2013   thyroid   Endometrial polyp    Endometrial thickening on ultra sound    GERD (gastroesophageal reflux disease)    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   Worse in her 71s when she was ill   H/O Hashimoto thyroiditis    History of chemotherapy    History of radiation therapy    History of thyroid cancer no recurrence   2013--  s/p  left lobe thyroidectomy--  follicular varient papillary / lymphocyctic thyroiditis   Hyperlipidemia 11/09/2015   Hypothyroidism    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HCC) 02/26/2018    PAST SURGICAL HISTORY: Past Surgical History:  Procedure Laterality Date   BIOPSY  02/19/2018   Procedure: BIOPSY;  Surgeon: Malissa Hippo, MD;  [facility];  Service: Endoscopy;;  rectum   COLONOSCOPY N/A 08/20/2015   Procedure: COLONOSCOPY;  Surgeon: Malissa Hippo, MD;  [facility];  Service: Endoscopy;  Laterality: N/A;  730   COLONOSCOPY WITH PROPOFOL N/A 07/21/2021   Procedure: COLONOSCOPY WITH PROPOFOL;  Surgeon: Malissa Hippo, MD;  [facility];  Service: Endoscopy;  Laterality: N/A;  10:10   D & C HYSTEROOSCOPY W/ THERMACHOICE ENDOMETRIAL ABLATION  08-13-2004   DIVERTING ILEOSTOMY N/A 12/07/2018   Procedure: DIVERTING LOOP ILEOSTOMY;  Surgeon: Romie Levee, MD;  [facility]: [facility];  Service: General;  Laterality: N/A;   FLEXIBLE SIGMOIDOSCOPY N/A 02/19/2018   Procedure: FLEXIBLE SIGMOIDOSCOPY;  Surgeon: Malissa Hippo, MD;  [facility];  Service: Endoscopy;  Laterality: N/A;   HYSTEROSCOPY WITH D & C N/A 04/30/2015   Procedure: DILATATION AND CURETTAGE / INTENDED HYSTEROSCOPY;  Surgeon: Marcelle Overlie, MD;  Location[facility];  Service: Gynecology;  Laterality: N/A;   ILEOSTOMY CLOSURE N/A 04/17/2019   Procedure: LOOP ILEOSTOMY REVERSAL;  Surgeon: Romie Levee, MD;  [facility];  Service: General;  Laterality: N/A;   IR US GUIDE BX ASP/DRAIN  03/09/2018   LAPAROSCOPIC CHOLECYSTECTOMY  04/1996   LAPAROSCOPY N/A 12/11/2018   Procedure: LAPAROSCOPY  ILEOSTOMY REVISION AND ABDOMINAL WASHOUT;  Surgeon: Romie Levee, MD;  [facility];  Service: General;  Laterality: N/A;   Liver Microwave   10/17/2018   POLYPECTOMY  08/20/2015   Procedure: POLYPECTOMY;  Surgeon: Malissa Hippo, MD;  [facility];  Service: Endoscopy;;  Splenic flexure polypectomy   POLYPECTOMY  02/19/2018   Procedure: POLYPECTOMY;  Surgeon: Malissa Hippo, MD;  [facility];  Service: Endoscopy;;  rectum   PORTACATH PLACEMENT N/A 03/14/2018   Procedure: INSERTION PORT-A-CATH;  Surgeon: Lovell Sheehan,  Loraine Leriche, MD;  Location: [facility];  Service: General;  Laterality: N/A;   REDUCTION INCARCERATED UTERUS  06-20-2000   intrauterine preg. 13 wks /  urinary retention   THYROID LOBECTOMY  11/24/2011   Procedure: THYROID LOBECTOMY;  Surgeon: Velora Heckler, MD;  [facility];  Service: General;  Laterality: Left;  Left Thyroid Lobectomy   TUBAL LIGATION  2002   XI ROBOTIC ASSISTED LOWER ANTERIOR RESECTION N/A 12/07/2018   Procedure: XI ROBOTIC ASSISTED LOWER ANTERIOR RESECTION, SPENIC FLEXURE IMMOBILIZATION, COLOANAL ANASTOMOSIS, RIGID PROCTOSCOPY;  Surgeon: Romie Levee, MD;  Location[facility];  Service: General;  Laterality: N/A;    PAST SOCIAL HISTORY:  Social History   Socioeconomic History   Marital status: Married    Spouse name: Not on file   Number of children: 4   Years of  education: Not on file   Highest education level: Not on file  Occupational History    Comment: Systems developer at [employer]  Tobacco Use   Smoking status: Former    Packs/day: 0.25    Years: 10.00    Additional pack years: 0.00    Total pack years: 2.50    Types: Cigarettes    Quit date: 10/31/1991    Years since quitting: 30.6   Smokeless tobacco: Never  Vaping Use   Vaping Use: Never used  Substance and Sexual Activity   Alcohol use: No   Drug use: No   Sexual activity: Not Currently  Other Topics Concern   Not on file  Social History Narrative   Lives with husband Loraine Leriche and 53 year old son Almeta Monas   Husband is Technical brewer of [employer]   Social Determinants of Health   Financial Resource Strain: Low Risk  (02/26/2018)   Overall Financial Resource Strain (CARDIA)    Difficulty of Paying Living Expenses: Not hard at all  Food Insecurity: No Food Insecurity (02/26/2018)   Hunger Vital Sign    Worried About Running Out of Food in the Last Year: Never true    Ran Out of Food in the Last Year: Never true  Transportation Needs: No Transportation Needs (02/26/2018)   PRAPARE - Administrator, Civil Service (Medical): No    Lack of Transportation (Non-Medical): No  Physical Activity: Inactive (02/26/2018)   Exercise Vital Sign    Days of Exercise per Week: 0 days    Minutes of Exercise per Session: 0 min  Stress: Stress Concern Present (02/26/2018)   [employer] of Occupational Health - Occupational Stress Questionnaire    Feeling of Stress : To some extent  Social Connections: Socially Integrated (02/26/2018)   Social Connection and Isolation Panel [NHANES]    Frequency of Communication with Friends and Family: More than three times a week    Frequency of Social Gatherings with Friends and Family: Twice a week    Attends Religious Services: More than 4 times per year    Active Member of [employer] or Organizations: Yes    Attends Engineer, structural: More than  4 times per year    Marital Status: Married  Catering manager Violence: Not At Risk (02/26/2018)   Humiliation, Afraid, Rape, and Kick questionnaire    Fear of Current or Ex-Partner: No    Emotionally Abused: No    Physically Abused: No    Sexually Abused: No    PAST FAMILY HISTORY: Family History  Problem Relation Age of Onset   Hypertension Mother    Hypertension Father  Prostate cancer Father    Cancer Maternal Aunt        spine/back   Cancer Paternal Grandfather        lung   Healthy Son    Healthy Son    Healthy Son    Healthy Son    Colon cancer Neg Hx     MEDICATIONS  Current Outpatient Medications  Medication Sig Dispense Refill   ALPRAZolam (XANAX) 0.5 MG tablet Take 1 tablet (0.5 mg total) by mouth 3 (three) times daily as needed for anxiety. 90 tablet 5   amitriptyline (ELAVIL) 10 MG tablet Take 1 tablet (10 mg total) by mouth 2 (two) times daily. 180 tablet 3   ARMOUR THYROID 60 MG tablet Take 1 tablet (60 mg total) by mouth daily. 90 tablet 1   ARMOUR THYROID 60 MG tablet Take 1 tablet (60 mg total) by mouth daily. 90 tablet 1   loperamide (IMODIUM) 2 MG capsule Take 1 capsule (2 mg total) by mouth 3 (three) times daily. (Patient taking differently: Take 2 mg by mouth daily.) 30 capsule 0   predniSONE (STERAPRED UNI-PAK 48 TAB) 10 MG (48) TBPK tablet Take as directed 48 tablet 0   pregabalin (LYRICA) 25 MG capsule Take 1 capsule (25 mg total) by mouth 2 (two) times daily. 60 capsule 3   prochlorperazine (COMPAZINE) 10 MG tablet Take 1 tablet (10 mg total) by mouth every 6 (six) hours as needed for nausea or vomiting. 60 tablet 3   tiZANidine (ZANAFLEX) 4 MG tablet Take 1 tablet (4 mg total) by mouth every 6 (six) hours as needed for muscle spasms (take for tightness and pain in the scapular region). 56 tablet 2   No current facility-administered medications for this visit.   Facility-Administered Medications Ordered in Other Visits  Medication Dose Route  Frequency Provider Last Rate Last Admin   clindamycin (CLEOCIN) 900 mg in dextrose 5 % 50 mL IVPB  900 mg Intravenous 60 min Pre-Op Romie Levee, MD       And   gentamicin (GARAMYCIN) 350 mg in dextrose 5 % 50 mL IVPB  5 mg/kg Intravenous 60 min Pre-Op Romie Levee, MD       clindamycin (CLEOCIN) 900 mg in dextrose 5 % 50 mL IVPB  900 mg Intravenous 60 min Pre-Op Romie Levee, MD       And   gentamicin (GARAMYCIN) 5 mg/kg in dextrose 5 % 50 mL IVPB  5 mg/kg Intravenous 60 min Pre-Op Romie Levee, MD       sodium chloride 0.9 % 1,000 mL with potassium chloride 20 mEq, magnesium sulfate 2 g infusion   Intravenous Once [name], MD        ALLERGIES:  Allergies  Allergen Reactions   Demerol [Meperidine] Itching    All over the body.   Penicillins Hives     childhood, does not remember if it spread all over the body or not. Did it involve swelling of the face/tongue/throat, SOB, or low BP? No Did it involve sudden or severe rash/hives, skin peeling, or any reaction on the inside of your mouth or nose? Yes Did you need to seek medical attention at a hospital or doctor's office? Yes When did it last happen?      childhood allergy If all above answers are "NO", may proceed with cephalosporin use.    Levaquin [Levofloxacin] Other (See Comments)    Patient has Aortic Aneurysm and is not indicated with this diagnosis   Other Hives  and Other (See Comments)    Fresh coconut    Vancomycin Rash    Will need Benadryl prior to administration per IV. "Red man syndrome"   Physical Exam: Wt Readings from Last 3 Encounters:  06/30/22 134 lb (60.8 kg)  06/09/22 140 lb (63.5 kg)  05/31/22 139 lb 6.4 oz (63.2 kg)   Temp Readings from Last 3 Encounters:  06/30/22 98.9 F (37.2 C) (Tympanic)  06/09/22 98 F (36.7 C) (Oral)  05/31/22 98.1 F (36.7 C) (Oral)   BP Readings from Last 3 Encounters:  06/30/22 125/84  06/09/22 103/72  05/31/22 124/81   Pulse Readings from Last 3  Encounters:  06/30/22 (!) 112  06/09/22 87  05/31/22 (!) 112   Unable to assess due to encounter type   Impression/Plan: 1. Progressive Metastatic Stage IV, [id number], adenocarcinoma of the rectum. Dr. Mitzi Hansen discusses the patient's recent imaging and course since her last visit. He recommends a palliative course of radiotherapy to her C6 spine. We discussed the risks, benefits, short, and long term effects of radiotherapy, as well as the palliative intent, and the patient is interested in proceeding. Dr. Mitzi Hansen discusses the delivery and logistics of radiotherapy and anticipates a course of 3 weeks of radiotherapy to C6 vertebral body. She will come today for simulation at which time she will sign written consent to proceed. She will also continue with plans for chemotherapy as well.  2. Pain secondary to #1. Given her neurologic symptoms, we would consider Dexamethasone 2 mg BID. She is open to trying this and continuing Lyrica as well. After reviewing the side effect profile she is open to trying this. A new prescription was sent to her pharmacy.       This encounter was conducted via telephone.  The patient has provided two factor identification and has given verbal consent for this type of encounter and has been advised to only accept a meeting of this type in a secure network environment. The time spent during this encounter was 45 minutes including preparation, discussion, and coordination of the patient's care. The attendants for this meeting include Dr. Mitzi Hansen, Ronny Bacon  and Samantha Reynolds and her husband Samantha Reynolds. During the encounter,  Dr. Mitzi Hansen, and Ronny Bacon were located at [facility] Radiation Oncology Department.  Samantha Reynolds was located at home with her husband Samantha Reynolds.       Osker Mason, PAC

## 2022-07-05 ENCOUNTER — Encounter (HOSPITAL_COMMUNITY): Payer: Self-pay | Admitting: Hematology

## 2022-07-05 ENCOUNTER — Other Ambulatory Visit: Payer: Self-pay

## 2022-07-05 ENCOUNTER — Encounter (HOSPITAL_COMMUNITY): Payer: 59 | Admitting: Physical Therapy

## 2022-07-05 ENCOUNTER — Encounter: Payer: Self-pay | Admitting: Hematology

## 2022-07-05 NOTE — Progress Notes (Signed)
Pharmacist Chemotherapy Monitoring - Initial Assessment    Anticipated start date: 07/06/22   The following has been reviewed per standard work regarding the patient's treatment regimen: The patient's diagnosis, treatment plan and drug doses, and organ/hematologic function Lab orders and baseline tests specific to treatment regimen  The treatment plan start date, drug sequencing, and pre-medications Prior authorization status  Patient's documented medication list, including drug-drug interaction screen and prescriptions for anti-emetics and supportive care specific to the treatment regimen The drug concentrations, fluid compatibility, administration routes, and timing of the medications to be used The patient's access for treatment and lifetime cumulative dose history, if applicable  The patient's medication allergies and previous infusion related reactions, if applicable   Changes made to treatment plan:  switch to insurance preferred biosimilar to Mvasi and changed treatment day.  Follow up needed:  N/A   Stephens Shire, [facility], 07/05/2022  11:35 AM

## 2022-07-06 ENCOUNTER — Other Ambulatory Visit: Payer: Self-pay | Admitting: *Deleted

## 2022-07-06 ENCOUNTER — Other Ambulatory Visit (HOSPITAL_COMMUNITY): Payer: Self-pay

## 2022-07-06 ENCOUNTER — Inpatient Hospital Stay: Payer: 59

## 2022-07-06 VITALS — BP 107/72 | HR 86 | Temp 98.7°F | Resp 18

## 2022-07-06 DIAGNOSIS — C787 Secondary malignant neoplasm of liver and intrahepatic bile duct: Secondary | ICD-10-CM | POA: Diagnosis not present

## 2022-07-06 DIAGNOSIS — C20 Malignant neoplasm of rectum: Secondary | ICD-10-CM | POA: Diagnosis not present

## 2022-07-06 DIAGNOSIS — Z510 Encounter for antineoplastic radiation therapy: Secondary | ICD-10-CM | POA: Diagnosis not present

## 2022-07-06 LAB — URINALYSIS, DIPSTICK ONLY
Bilirubin Urine: NEGATIVE
Glucose, UA: NEGATIVE mg/dL
Hgb urine dipstick: NEGATIVE
Ketones, ur: NEGATIVE mg/dL
Nitrite: NEGATIVE
Protein, ur: NEGATIVE mg/dL
Specific Gravity, Urine: 1.019 (ref 1.005–1.030)
pH: 5 (ref 5.0–8.0)

## 2022-07-06 LAB — CBC WITH DIFFERENTIAL/PLATELET
Abs Immature Granulocytes: 0.01 10*3/uL (ref 0.00–0.07)
Basophils Absolute: 0 10*3/uL (ref 0.0–0.1)
Basophils Relative: 1 %
Eosinophils Absolute: 0 10*3/uL (ref 0.0–0.5)
Eosinophils Relative: 2 %
HCT: 32.1 % — ABNORMAL LOW (ref 36.0–46.0)
Hemoglobin: 11.1 g/dL — ABNORMAL LOW (ref 12.0–15.0)
Immature Granulocytes: 0 %
Lymphocytes Relative: 23 %
Lymphs Abs: 0.6 10*3/uL — ABNORMAL LOW (ref 0.7–4.0)
MCH: 34.4 pg — ABNORMAL HIGH (ref 26.0–34.0)
MCHC: 34.6 g/dL (ref 30.0–36.0)
MCV: 99.4 fL (ref 80.0–100.0)
Monocytes Absolute: 0.2 10*3/uL (ref 0.1–1.0)
Monocytes Relative: 8 %
Neutro Abs: 1.7 10*3/uL (ref 1.7–7.7)
Neutrophils Relative %: 66 %
Platelets: 107 10*3/uL — ABNORMAL LOW (ref 150–400)
RBC: 3.23 MIL/uL — ABNORMAL LOW (ref 3.87–5.11)
RDW: 13.6 % (ref 11.5–15.5)
WBC: 2.5 10*3/uL — ABNORMAL LOW (ref 4.0–10.5)
nRBC: 0 % (ref 0.0–0.2)

## 2022-07-06 LAB — COMPREHENSIVE METABOLIC PANEL
ALT: 13 U/L (ref 0–44)
AST: 20 U/L (ref 15–41)
Albumin: 3.9 g/dL (ref 3.5–5.0)
Alkaline Phosphatase: 128 U/L — ABNORMAL HIGH (ref 38–126)
Anion gap: 7 (ref 5–15)
BUN: 18 mg/dL (ref 6–20)
CO2: 24 mmol/L (ref 22–32)
Calcium: 8.6 mg/dL — ABNORMAL LOW (ref 8.9–10.3)
Chloride: 105 mmol/L (ref 98–111)
Creatinine, Ser: 0.89 mg/dL (ref 0.44–1.00)
GFR, Estimated: >60 mL/min (ref >60)
Glucose, Bld: 97 mg/dL (ref 70–99)
Potassium: 3.4 mmol/L — ABNORMAL LOW (ref 3.5–5.1)
Sodium: 136 mmol/L (ref 135–145)
Total Bilirubin: 3.3 mg/dL — ABNORMAL HIGH (ref 0.3–1.2)
Total Protein: 6.7 g/dL (ref 6.5–8.1)

## 2022-07-06 LAB — MAGNESIUM: Magnesium: 2.3 mg/dL (ref 1.7–2.4)

## 2022-07-06 MED ORDER — LEUCOVORIN CALCIUM INJECTION 350 MG
320.0000 mg/m2 | Freq: Once | INTRAVENOUS | Status: AC
  Administered 2022-07-06: 540 mg via INTRAVENOUS
  Filled 2022-07-06: qty 27

## 2022-07-06 MED ORDER — OXALIPLATIN CHEMO INJECTION 100 MG/20ML
68.0000 mg/m2 | Freq: Once | INTRAVENOUS | Status: AC
  Administered 2022-07-06: 115 mg via INTRAVENOUS
  Filled 2022-07-06: qty 23

## 2022-07-06 MED ORDER — FLUOROURACIL CHEMO INJECTION 500 MG/10ML
320.0000 mg/m2 | Freq: Once | INTRAVENOUS | Status: AC
  Administered 2022-07-06: 500 mg via INTRAVENOUS
  Filled 2022-07-06: qty 10

## 2022-07-06 MED ORDER — SODIUM CHLORIDE 0.9% FLUSH: 10.0000 mL | INTRAVENOUS | Status: DC | PRN

## 2022-07-06 MED ORDER — PROCHLORPERAZINE MALEATE 10 MG PO TABS
10.0000 mg | ORAL_TABLET | Freq: Four times a day (QID) | ORAL | 4 refills | Status: AC | PRN
  Filled 2022-07-06: qty 30, 8d supply, fill #0

## 2022-07-06 MED ORDER — SODIUM CHLORIDE 0.9 % IV SOLN: Freq: Once | INTRAVENOUS | Status: AC

## 2022-07-06 MED ORDER — SODIUM CHLORIDE 0.9 % IV SOLN
5.0000 mg/kg | Freq: Once | INTRAVENOUS | Status: AC
  Administered 2022-07-06: 300 mg via INTRAVENOUS
  Filled 2022-07-06: qty 12

## 2022-07-06 MED ORDER — DEXTROSE 5 % IV SOLN: Freq: Once | INTRAVENOUS | Status: AC

## 2022-07-06 MED ORDER — SODIUM CHLORIDE 0.9 % IV SOLN
10.0000 mg | Freq: Once | INTRAVENOUS | Status: AC
  Administered 2022-07-06: 10 mg via INTRAVENOUS
  Filled 2022-07-06: qty 10

## 2022-07-06 MED ORDER — PALONOSETRON HCL INJECTION 0.25 MG/5ML
0.2500 mg | Freq: Once | INTRAVENOUS | Status: AC
  Administered 2022-07-06: 0.25 mg via INTRAVENOUS
  Filled 2022-07-06: qty 5

## 2022-07-06 MED ORDER — SODIUM CHLORIDE 0.9 % IV SOLN
1920.0000 mg/m2 | INTRAVENOUS | Status: DC
  Administered 2022-07-06: 3500 mg via INTRAVENOUS
  Filled 2022-07-06: qty 70

## 2022-07-06 MED ORDER — HEPARIN SOD (PORK) LOCK FLUSH 100 UNIT/ML IV SOLN: 500.0000 [IU] | Freq: Once | INTRAVENOUS | Status: DC | PRN

## 2022-07-06 NOTE — Patient Instructions (Signed)
[facility]  Discharge Instructions: Thank you for choosing [facility] to provide your oncology and hematology care.  If you have a lab appointment with the Cancer Center - please note that after April 8th, 2024, all labs will be drawn in the cancer center.  You do not have to check in or register with the main entrance as you have in the past but will complete your check-in in the cancer center.  Wear comfortable clothing and clothing appropriate for easy access to any Portacath or PICC line.   We strive to give you quality time with your provider. You may need to reschedule your appointment if you arrive late (15 or more minutes).  Arriving late affects you and other patients whose appointments are after yours.  Also, if you miss three or more appointments without notifying the office, you may be dismissed from the clinic at the provider's discretion.      For prescription refill requests, have your pharmacy contact our office and allow 72 hours for refills to be completed.    Today you received the following chemotherapy and/or immunotherapy agents FOLFOX with pump start      To help prevent nausea and vomiting after your treatment, we encourage you to take your nausea medication as directed.  BELOW ARE SYMPTOMS THAT SHOULD BE REPORTED IMMEDIATELY: *FEVER GREATER THAN 100.4 F (38 C) OR HIGHER *CHILLS OR SWEATING *NAUSEA AND VOMITING THAT IS NOT CONTROLLED WITH YOUR NAUSEA MEDICATION *UNUSUAL SHORTNESS OF BREATH *UNUSUAL BRUISING OR BLEEDING *URINARY PROBLEMS (pain or burning when urinating, or frequent urination) *BOWEL PROBLEMS (unusual diarrhea, constipation, pain near the anus) TENDERNESS IN MOUTH AND THROAT WITH OR WITHOUT PRESENCE OF ULCERS (sore throat, sores in mouth, or a toothache) UNUSUAL RASH, SWELLING OR PAIN  UNUSUAL VAGINAL DISCHARGE OR ITCHING   Items with * indicate a potential emergency and should be followed up as soon as possible  or go to the Emergency Department if any problems should occur.  Please show the CHEMOTHERAPY ALERT CARD or IMMUNOTHERAPY ALERT CARD at check-in to the Emergency Department and triage nurse.  Should you have questions after your visit or need to cancel or reschedule your appointment, please contact [facility] [phone]  and follow the prompts.  Office hours are 8:00 a.m. to 4:30 p.m. Monday - Friday. Please note that voicemails left after 4:00 p.m. may not be returned until the following business day.  We are closed weekends and major holidays. You have access to a nurse at all times for urgent questions. Please call the main number to the clinic [phone] and follow the prompts.  For any non-urgent questions, you may also contact your provider using MyChart. We now offer e-Visits for anyone 42 and older to request care online for non-urgent symptoms. For details visit [email or web address].   Also download the MyChart app! Go to the app store, search "MyChart", open the app, select [facility], and log in with your MyChart username and password.

## 2022-07-06 NOTE — Progress Notes (Signed)
Patient presents today for D1C1 FOLFOX with 5FU pump start.  Vital signs with parameters for treatment.  Labs pending.  Patient has no complaints at this time.  Labs within parameters for treatment.  Treatment given today per MD orders.  Stable during infusion without adverse affects.  Verified RUN on the screen with the patient.  Vital signs stable.  No complaints at this time.  Discharge from clinic ambulatory in stable condition.  Alert and oriented X 3.  Follow up with [facility] as scheduled.

## 2022-07-06 NOTE — Addendum Note (Signed)
Addended by: [name] on: 07/06/2022 04:37 PM   Modules accepted: Orders

## 2022-07-07 ENCOUNTER — Other Ambulatory Visit (HOSPITAL_COMMUNITY): Payer: Self-pay

## 2022-07-07 ENCOUNTER — Ambulatory Visit
Admission: RE | Admit: 2022-07-07 | Discharge: 2022-07-07 | Disposition: A | Discharge status: Home / Self Care | Payer: 59 | Source: Ambulatory Visit | Attending: Radiation Oncology | Admitting: Radiation Oncology

## 2022-07-07 ENCOUNTER — Encounter (HOSPITAL_COMMUNITY): Payer: 59 | Admitting: Physical Therapy

## 2022-07-07 ENCOUNTER — Inpatient Hospital Stay: Payer: 59 | Admitting: Licensed Clinical Social Worker

## 2022-07-07 ENCOUNTER — Encounter: Payer: Self-pay | Admitting: Radiation Oncology

## 2022-07-07 VITALS — Ht 66.5 in | Wt 138.0 lb

## 2022-07-07 DIAGNOSIS — C7800 Secondary malignant neoplasm of unspecified lung: Secondary | ICD-10-CM | POA: Insufficient documentation

## 2022-07-07 DIAGNOSIS — C7951 Secondary malignant neoplasm of bone: Secondary | ICD-10-CM | POA: Insufficient documentation

## 2022-07-07 DIAGNOSIS — C20 Malignant neoplasm of rectum: Secondary | ICD-10-CM

## 2022-07-07 DIAGNOSIS — C7802 Secondary malignant neoplasm of left lung: Secondary | ICD-10-CM | POA: Diagnosis not present

## 2022-07-07 DIAGNOSIS — Z510 Encounter for antineoplastic radiation therapy: Secondary | ICD-10-CM | POA: Insufficient documentation

## 2022-07-07 DIAGNOSIS — C189 Malignant neoplasm of colon, unspecified: Secondary | ICD-10-CM

## 2022-07-07 DIAGNOSIS — C787 Secondary malignant neoplasm of liver and intrahepatic bile duct: Secondary | ICD-10-CM | POA: Insufficient documentation

## 2022-07-07 MED ORDER — DEXAMETHASONE 2 MG PO TABS
2.0000 mg | ORAL_TABLET | Freq: Two times a day (BID) | ORAL | 1 refills | Status: DC
  Filled 2022-07-07: qty 60, 30d supply, fill #0

## 2022-07-07 NOTE — Progress Notes (Signed)
[facility] Clinical Social Work  Initial Assessment   Samantha Reynolds is a 57 y.o. year old female contacted by phone. Clinical Social Work was referred by medical provider for assessment of psychosocial needs.   SDOH (Social Determinants of Health) assessments performed: Yes   SDOH Screenings   Food Insecurity: No Food Insecurity (02/26/2018)  Transportation Needs: No Transportation Needs (02/26/2018)  Depression (PHQ2-9): Low Risk  (03/31/2022)  Financial Resource Strain: Low Risk  (02/26/2018)  Physical Activity: Inactive (02/26/2018)  Social Connections: Socially Integrated (02/26/2018)  Stress: Stress Concern Present (02/26/2018)  Tobacco Use: Medium Risk (07/07/2022)     Distress Screen completed: Yes    07/07/2022    7:27 AM  ONCBCN DISTRESS SCREENING  Distress experienced in past week (1-10) 6  Emotional problem type Adjusting to illness;Nervousness/Anxiety      Family/Social Information:  Housing Arrangement: patient lives with her husband and their 22 year old son.   Family members/support persons in your life? Pt has 4 children, 1 at home and 2 who reside locally and will be able to assist if needed.   Transportation concerns: no  Employment: Working full time pt is employed at [employer].  Pt is able to predominantly work from home and has in the past been able to work around her treatment schedule and maintain full time status.  Pt's spouse also has flexibility with his work and is able to work from home and provide support as needed.  Income source: Employment Financial concerns: No Type of concern: None Food access concerns: no Religious or spiritual practice: Data processing manager Currently in place:  none  Coping/ Adjustment to diagnosis: Patient understands treatment plan and what happens next? yes Concerns about diagnosis and/or treatment: Quality of life Patient reported stressors: Adjusting to my illness Hopes and/or priorities: Pt's priority is to continue w/  treatment w/ the hopes of positive results Patient enjoys time with family/ friends Current coping skills/ strengths: Capable of independent living , Contractor , Motivation for treatment/growth , Physical Health , and Supportive family/friends     SUMMARY: Current SDOH Barriers:  No barriers identified at this time  Clinical Social Work Clinical Goal(s):  No clinical social work goals at this time  Interventions: Discussed common feeling and emotions when being diagnosed with cancer, and the importance of support during treatment Informed patient of the support team roles and support services at [facility] Provided CSW contact information and encouraged patient to call with any questions or concerns Referred patient to [employer] [employer] [facility].   Follow Up Plan: Patient will contact CSW with any support or resource needs Patient verbalizes understanding of plan: Yes    Rachel Moulds, LCSW

## 2022-07-07 NOTE — Progress Notes (Signed)
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 Patient identity verified.   Patient reports occasional RT arm/ shoulder pain 7/10. No other issues conveyed at this time.  Meaningful use complete. Postmenopausal- NO pregnancy.  Ht 5' 6.5" (1.689 m)   Wt 138 lb (62.6 kg)   LMP 02/16/2015 (Approximate)   BMI 21.94 kg/m    This concludes the interview.   Ruel Favors, LPN

## 2022-07-08 ENCOUNTER — Inpatient Hospital Stay: Payer: 59

## 2022-07-08 ENCOUNTER — Other Ambulatory Visit: Payer: Self-pay

## 2022-07-08 VITALS — BP 126/82 | HR 89 | Temp 97.5°F | Resp 18

## 2022-07-08 DIAGNOSIS — C20 Malignant neoplasm of rectum: Secondary | ICD-10-CM

## 2022-07-08 DIAGNOSIS — Z510 Encounter for antineoplastic radiation therapy: Secondary | ICD-10-CM | POA: Diagnosis not present

## 2022-07-08 MED ORDER — SODIUM CHLORIDE 0.9% FLUSH
10.0000 mL | INTRAVENOUS | Status: DC | PRN
  Administered 2022-07-08: 10 mL

## 2022-07-08 MED ORDER — HEPARIN SOD (PORK) LOCK FLUSH 100 UNIT/ML IV SOLN
500.0000 [IU] | Freq: Once | INTRAVENOUS | Status: AC | PRN
  Administered 2022-07-08: 500 [IU]

## 2022-07-08 NOTE — Patient Instructions (Signed)
[facility] [place]  Discharge Instructions: Thank you for choosing [facility] to provide your oncology and hematology care.  If you have a lab appointment with the Cancer Center - please note that after April 8th, 2024, all labs will be drawn in the cancer center.  You do not have to check in or register with the main entrance as you have in the past but will complete your check-in in the cancer center.  Wear comfortable clothing and clothing appropriate for easy access to any Portacath or PICC line.   We strive to give you quality time with your provider. You may need to reschedule your appointment if you arrive late (15 or more minutes).  Arriving late affects you and other patients whose appointments are after yours.  Also, if you miss three or more appointments without notifying the office, you may be dismissed from the clinic at the provider's discretion.      For prescription refill requests, have your pharmacy contact our office and allow 72 hours for refills to be completed.  To help prevent nausea and vomiting after your treatment, we encourage you to take your nausea medication as directed.  BELOW ARE SYMPTOMS THAT SHOULD BE REPORTED IMMEDIATELY: *FEVER GREATER THAN 100.4 F (38 C) OR HIGHER *CHILLS OR SWEATING *NAUSEA AND VOMITING THAT IS NOT CONTROLLED WITH YOUR NAUSEA MEDICATION *UNUSUAL SHORTNESS OF BREATH *UNUSUAL BRUISING OR BLEEDING *URINARY PROBLEMS (pain or burning when urinating, or frequent urination) *BOWEL PROBLEMS (unusual diarrhea, constipation, pain near the anus) TENDERNESS IN MOUTH AND THROAT WITH OR WITHOUT PRESENCE OF ULCERS (sore throat, sores in mouth, or a toothache) UNUSUAL RASH, SWELLING OR PAIN  UNUSUAL VAGINAL DISCHARGE OR ITCHING   Items with * indicate a potential emergency and should be followed up as soon as possible or go to the Emergency Department if any problems should occur.  Please show the CHEMOTHERAPY ALERT CARD or  IMMUNOTHERAPY ALERT CARD at check-in to the Emergency Department and triage nurse.  Should you have questions after your visit or need to cancel or reschedule your appointment, please contact [facility] [place] [phone]  and follow the prompts.  Office hours are 8:00 a.m. to 4:30 p.m. Monday - Friday. Please note that voicemails left after 4:00 p.m. may not be returned until the following business day.  We are closed weekends and major holidays. You have access to a nurse at all times for urgent questions. Please call the main number to the clinic [phone] and follow the prompts.  For any non-urgent questions, you may also contact your provider using MyChart. We now offer e-Visits for anyone 18 and older to request care online for non-urgent symptoms. For details visit [email or web address].   Also download the MyChart app! Go to the app store, search "MyChart", open the app, select [name], and log in with your MyChart username and password.   

## 2022-07-08 NOTE — Progress Notes (Signed)
Patients port flushed without difficulty.  Good blood return noted with no bruising or swelling noted at site.  Band aid applied.  Home 5FU pump disconnected with no issues.VSS with discharge and left in satisfactory condition with no s/s of distress noted.

## 2022-07-11 ENCOUNTER — Inpatient Hospital Stay: Payer: 59 | Admitting: Dietician

## 2022-07-11 ENCOUNTER — Telehealth: Payer: Self-pay | Admitting: *Deleted

## 2022-07-11 NOTE — Telephone Encounter (Signed)
Spoke with the patient to see how she is feeling since we started steroids last week.  She reports she is feeling much better.  I let her know that we would start tapering her steroids about 2 weeks into her radiation treatments.  She verbalized understanding.  No other concerns voiced.    Coralyn Helling. Vickii Chafe, BSN

## 2022-07-11 NOTE — Progress Notes (Signed)
Nutrition Follow-up:  Patient with progression of rectal cancer. She is currently receiving Folfox + Bevacizumab q14d (start 4/9). Patient planning palliative radiation to cervical spine under the care of Dr. Mitzi Hansen.  Spoke with patient via telephone. She reports tolerating chemotherapy well overall. Patient pleased to report no cold sensitivity. She is hoping to enjoy ice in her drinks through out treatment. Patient reports appetite is not great. She is eating 3 small meals + one snack. Patient has noticed having to push herself to eat the last few bites. Patient normally has bowl of cereal with fair life milk for breakfast. Light lunch (apple with peanut butter, banana, cheese crackers). Patient sometimes will have core power/ensure before dinner. Plans to have beef nachos for supper this evening. Patient drinks mostly sweet tea. States she drinks very little water. Patient reports dexamethasone is working well to control pain. This does not constipate her like the lyrica,    Medications: decadron, lyrica, compazine, imodium   Labs: 4/10 - K 3.4  Anthropometrics: Last wt 138 lb on 4/11    NUTRITION DIAGNOSIS: Inadequate oral intake continues     INTERVENTION:  Encouraged protein source with all meals and snacks Suggested daily ONS for added calories and protein - CIB with fairlife milk provides 280 kcal, 23 grams protein) Educated on importance of hydration - pt will work to increase intake of water    MONITORING, EVALUATION, GOAL: weight trends, intake   NEXT VISIT: To be scheduled as needed

## 2022-07-11 NOTE — Telephone Encounter (Signed)
24 hour chemotherapy call placed today, pt stated she felt tired. Pt denies diarrhea, vomiting, nausea, and constipation. Pt advised to call the clinic if needed.

## 2022-07-12 ENCOUNTER — Encounter (HOSPITAL_COMMUNITY): Payer: Self-pay | Admitting: Hematology

## 2022-07-12 ENCOUNTER — Encounter: Payer: Self-pay | Admitting: Hematology

## 2022-07-12 ENCOUNTER — Other Ambulatory Visit: Payer: Self-pay

## 2022-07-12 DIAGNOSIS — Z510 Encounter for antineoplastic radiation therapy: Secondary | ICD-10-CM | POA: Diagnosis not present

## 2022-07-12 DIAGNOSIS — C20 Malignant neoplasm of rectum: Secondary | ICD-10-CM | POA: Diagnosis not present

## 2022-07-12 DIAGNOSIS — C7802 Secondary malignant neoplasm of left lung: Secondary | ICD-10-CM | POA: Diagnosis not present

## 2022-07-12 DIAGNOSIS — C189 Malignant neoplasm of colon, unspecified: Secondary | ICD-10-CM

## 2022-07-13 ENCOUNTER — Ambulatory Visit: Payer: 59 | Admitting: Radiation Oncology

## 2022-07-13 NOTE — Progress Notes (Signed)
[facility] [address] [place] [address]    Clinic Day:  07/14/2022  Referring physician: Babs Sciara, MD  Patient Care Team: Babs Sciara, MD as PCP - General (Family Medicine) [name], MD as Medical Oncologist (Medical Oncology)   ASSESSMENT & PLAN:   Assessment: 1.  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  Liver biopsy on 03/09/2018 consistent with adenocarcinoma. 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 -7 cycles of FOLFOX with vectibix completed on 06/27/2018. -XRT with Xeloda from 07/30/2018 through 09/06/2018. -Right liver lesion microwave ablation on 10/15/2018 at [facility]. -Low anterior resection and diverting ileostomy on 12/07/2018, pathology [id number], 0/6 lymph nodes positive, margins negative. -Loop ileostomy reversal on 04/17/2019. - Microwave ablation of the right hepatic lobe lesion by Dr. Selena Batten around 08/15/2019. -SBRT to the left lower lobe lung lesion and left liver lobe lesion on 12/13/2019 at [facility]. - Right upper lobe lesion SBRT from 08/06/2020 through 08/13/2020. - 3 left lung lesions and subcarinal lymph node IMRT from 09/21/2021 through 10/05/2021. - Liver biopsy (06/09/2022): Metastatic moderately differentiated colonic adenocarcinoma. - NGS: KRAS/NRAS/BRAF wild-type, MSI-stable, HER2-0, negative for other targetable mutations - Cycle 1 of FOLFOX and bevacizumab on 07/06/2022 - XRT to the right neck from 07/14/2022 through 08/03/2022   2.  Hyperbilirubinemia: -Total bilirubin is 5.1.  She has Gilbert's syndrome.  Baseline is between 2 and 3.   3.  Hypothyroidism: -She is on Armour Thyroid.  TSH is 3.6.   4.  Weight loss: -She had 10 pound weight loss since her last visit 3 months ago.  However she had ileostomy reversal and is adjusting to new food habits.    Plan: 1.  Stage IV rectal adenocarcinoma, BRAF/RAS wild-type,  MSI-stable: - Cycle 1 of dose reduced FOLFOX and bevacizumab on 07/06/2022. - She reported some fatigue.  She had hoarseness of voice.  Hoarseness gets better on talking.  She also reported cramping in the left foot at 1 time. - She was evaluated by radiation oncology for the right neck pain.  She was started on dexamethasone 2 mg twice daily.  She reported improvement in pain.  She will start radiation therapy today. - She is concerned that tiredness will worsen with combination of chemo and radiation.  Hence have recommended to hold off on chemotherapy until she finishes radiation therapy.  We will restart chemotherapy during the week of 08/10/2022.   2.  Hyperbilirubinemia: - She has mildly elevated bilirubin due to Gilbert's syndrome.  I have dose reduced chemotherapy.  Bilirubin today is 2.5.  3.  Normocytic anemia: - Hemoglobin today is 11.8.  Will monitor ferritin and iron panel.  Weekly.   4.  Leukopenia and thrombocytopenia: - Intermittent leukopenia and mild thrombocytopenia from splenomegaly.  Will closely monitor.   5.  C6 vertebral body metastasis: - MRI C-spine on 07/01/2022: Osseous metastatic disease and C6 vertebral body extending into the right posterior elements with extraosseous tumor along the posterior endplate and extending into the right C5-C6 and C6-C7 neural foramina. - She started on dexamethasone 2 mg twice daily with improvement in pain. - XRT to start today.  No orders of the defined types were placed in this encounter.     I,Katie Daubenspeck,acting as a Neurosurgeon for [name], MD.,have documented all relevant documentation on the behalf of [name], MD,as directed by  [name], MD while in the presence of [name], MD.   ,  I, [name] MD, have reviewed the above documentation for accuracy and completeness, and I agree with the above.   [name], MD   4/18/20245:35 PM  CHIEF COMPLAINT:    Diagnosis: metastatic rectal cancer to liver    Cancer Staging  Malignant neoplasm of rectum Staging form: Colon and Rectum, AJCC 8th Edition - Clinical stage from 03/13/2018: Stage IVA (cT3, cN1b, cM1a) - Signed by [name], MD on 03/13/2018    Prior Therapy: 1. FOLFOX & vectibix x 7 cycles from 03/14/2018 to 06/27/2018. 2. XRT with Xeloda from 07/30/2018 to 09/06/2018. 3. Right liver lesion microwave ablation on 10/15/2018. 4. Low anterior resection and diverting ileostomy on 12/07/2018. 5. SBRT to liver 60 Gy in 5 fractions from 11/15/2019 to 12/13/2019.  Current Therapy:  FOLFOX and bevacizumab    HISTORY OF PRESENT ILLNESS:   Oncology History  Malignant neoplasm of rectum  02/26/2018 Initial Diagnosis   Rectal cancer (HCC)   03/13/2018 Cancer Staging   Staging form: Colon and Rectum, AJCC 8th Edition - Clinical stage from 03/13/2018: Stage IVA (cT3, cN1b, cM1a) - Signed by [name], MD on 03/13/2018   03/14/2018 - 06/29/2018 Chemotherapy   The patient had palonosetron (ALOXI) injection 0.25 mg, 0.25 mg, Intravenous,  Once, 7 of 8 cycles Administration: 0.25 mg (03/14/2018), 0.25 mg (03/27/2018), 0.25 mg (04/18/2018), 0.25 mg (05/02/2018), 0.25 mg (05/16/2018), 0.25 mg (06/05/2018), 0.25 mg (06/27/2018) leucovorin 800 mg in dextrose 5 % 250 mL infusion, 772 mg, Intravenous,  Once, 7 of 8 cycles Administration: 800 mg (03/14/2018), 800 mg (03/27/2018), 800 mg (05/02/2018), 800 mg (05/16/2018), 700 mg (04/18/2018), 800 mg (06/05/2018), 800 mg (06/27/2018) oxaliplatin (ELOXATIN) 165 mg in dextrose 5 % 500 mL chemo infusion, 85 mg/m2 = 165 mg, Intravenous,  Once, 6 of 7 cycles Dose modification: 68 mg/m2 (80 % of original dose 85 mg/m2, Cycle 4, Reason: Provider Judgment) Administration: 165 mg (03/14/2018), 165 mg (03/27/2018), 130 mg (05/02/2018), 130 mg (05/16/2018), 130 mg (06/05/2018), 130 mg (06/27/2018) panitumumab (VECTIBIX) 500 mg in sodium chloride 0.9 % 100 mL  chemo infusion, 480 mg, Intravenous,  Once, 4 of 5 cycles Administration: 500 mg (04/18/2018), 480 mg (05/02/2018), 480 mg (05/16/2018), 480 mg (06/05/2018) fluorouracil (ADRUCIL) chemo injection 750 mg, 400 mg/m2 = 750 mg (100 % of original dose 400 mg/m2), Intravenous,  Once, 6 of 7 cycles Dose modification: 400 mg/m2 (original dose 400 mg/m2, Cycle 1), 400 mg/m2 (original dose 400 mg/m2, Cycle 3) Administration: 750 mg (03/14/2018), 750 mg (04/18/2018), 750 mg (05/02/2018), 750 mg (05/16/2018), 750 mg (06/05/2018), 750 mg (06/27/2018) fosaprepitant (EMEND) 150 mg, dexamethasone (DECADRON) 12 mg in sodium chloride 0.9 % 145 mL IVPB, , Intravenous,  Once, 4 of 5 cycles Administration:  (05/02/2018),  (05/16/2018),  (06/05/2018),  (06/27/2018) fluorouracil (ADRUCIL) 4,650 mg in sodium chloride 0.9 % 57 mL chemo infusion, 2,400 mg/m2 = 4,650 mg, Intravenous, 1 Day/Dose, 7 of 8 cycles Administration: 4,650 mg (03/14/2018), 4,650 mg (03/27/2018), 4,650 mg (04/18/2018), 4,650 mg (05/02/2018), 4,650 mg (05/16/2018), 4,650 mg (06/05/2018), 4,650 mg (06/27/2018)  for chemotherapy treatment.    07/06/2022 -  Chemotherapy   Patient is on Treatment Plan : COLORECTAL FOLFOX + Bevacizumab q14d        INTERVAL HISTORY:   Samantha Reynolds is a 57 y.o. female presenting to clinic today for follow up of metastatic rectal cancer to liver. She was last seen by me on 06/30/22.  Since her last visit, she underwent cervical spine MRI on 07/01/22 showing: osseous metastatic disease in C6  vertebral body extending to right posterior elements with extraosseous tumor along posterior endplate and extending into right C5-6 and C6-7 neural foramina; no other osseous metastatic disease.  She was seen in reconsultation with Dr. Mitzi Hansen and his PA Laurence Aly on 07/07/22 to discuss palliative radiation to the metastasis at C6. She was started on dexamethasone at that time. She is scheduled to start treatment following her treatment today.  Today, she states that  she is doing well overall. Her appetite level is at 50%. Her energy level is at 50%.  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   Worse in her 89s when she was ill   H/O Hashimoto thyroiditis    History of chemotherapy    History of radiation therapy    History of thyroid cancer no recurrence   2013--  s/p  left lobe thyroidectomy--  follicular varient papillary / lymphocyctic thyroiditis   Hyperlipidemia 11/09/2015   Hypothyroidism    Malignant neoplasm of rectum 02/26/2018    Surgical History: Past Surgical History:  Procedure Laterality Date   BIOPSY  02/19/2018   Procedure: BIOPSY;  Surgeon: Malissa Hippo, MD;  [facility];  Service: Endoscopy;;  rectum   COLONOSCOPY N/A 08/20/2015   Procedure: COLONOSCOPY;  Surgeon: Malissa Hippo, MD;  [facility];  Service: Endoscopy;  Laterality: N/A;  730   COLONOSCOPY WITH PROPOFOL N/A 07/21/2021   Procedure: COLONOSCOPY WITH PROPOFOL;  Surgeon: Malissa Hippo, MD;  [facility];  Service: Endoscopy;  Laterality: N/A;  10:10   D & C HYSTEROOSCOPY W/ THERMACHOICE ENDOMETRIAL ABLATION  08-13-2004   DIVERTING ILEOSTOMY N/A 12/07/2018   Procedure: DIVERTING LOOP ILEOSTOMY;  Surgeon: Romie Levee, MD;  Location: [facility];  Service: General;  Laterality: N/A;   FLEXIBLE SIGMOIDOSCOPY N/A 02/19/2018   Procedure: FLEXIBLE SIGMOIDOSCOPY;  Surgeon: Malissa Hippo, MD;  [facility]: [facility];  Service: Endoscopy;  Laterality: N/A;   HYSTEROSCOPY WITH D & C N/A 04/30/2015   Procedure: DILATATION AND CURETTAGE / INTENDED HYSTEROSCOPY;  Surgeon: Marcelle Overlie, MD;  Location: [facility];  Service: Gynecology;  Laterality: N/A;   ILEOSTOMY CLOSURE N/A 04/17/2019   Procedure: LOOP  ILEOSTOMY REVERSAL;  Surgeon: Romie Levee, MD;  Location: [facility];  Service: General;  Laterality: N/A;   IR US GUIDE BX ASP/DRAIN  03/09/2018   LAPAROSCOPIC CHOLECYSTECTOMY  04/1996   LAPAROSCOPY N/A 12/11/2018   Procedure: LAPAROSCOPY  ILEOSTOMY REVISION AND ABDOMINAL WASHOUT;  Surgeon: Romie Levee, MD;  Location: [facility];  Service: General;  Laterality: N/A;   Liver Microwave   10/17/2018   POLYPECTOMY  08/20/2015   Procedure: POLYPECTOMY;  Surgeon: Malissa Hippo, MD;  [facility];  Service: Endoscopy;;  Splenic flexure polypectomy   POLYPECTOMY  02/19/2018   Procedure: POLYPECTOMY;  Surgeon: Malissa Hippo, MD;  [facility];  Service: Endoscopy;;  rectum   PORTACATH PLACEMENT N/A 03/14/2018   Procedure: INSERTION PORT-A-CATH;  Surgeon: Franky Macho, MD;  Location: [facility];  Service: General;  Laterality: N/A;   REDUCTION INCARCERATED UTERUS  06-20-2000   intrauterine preg. 13 wks /  urinary retention   THYROID LOBECTOMY  11/24/2011   Procedure: THYROID LOBECTOMY;  Surgeon: Tawanna Cooler  Molly Maduro, MD;  Location: [facility];  Service: General;  Laterality: Left;  Left Thyroid Lobectomy   TUBAL LIGATION  2002   XI ROBOTIC ASSISTED LOWER ANTERIOR RESECTION N/A 12/07/2018   Procedure: XI ROBOTIC ASSISTED LOWER ANTERIOR RESECTION, SPENIC FLEXURE IMMOBILIZATION, COLOANAL ANASTOMOSIS, RIGID PROCTOSCOPY;  Surgeon: Romie Levee, MD;  Location: [facility];  Service: General;  Laterality: N/A;    Social History: Social History   Socioeconomic History   Marital status: Married    Spouse name: Not on file   Number of children: 4   Years of education: Not on file   Highest education level: Not on file  Occupational History    Comment: Systems developer at [employer]  Tobacco Use   Smoking status: Former    Packs/day: 0.25    Years: 10.00    Additional pack years: 0.00    Total pack years: 2.50    Types: Cigarettes    Quit date: 10/31/1991    Years since quitting: 30.7   Smokeless  tobacco: Never  Vaping Use   Vaping Use: Never used  Substance and Sexual Activity   Alcohol use: No   Drug use: No   Sexual activity: Not Currently  Other Topics Concern   Not on file  Social History Narrative   Lives with husband Loraine Leriche and 16 year old son Almeta Monas   Husband is Technical brewer of [employer]   Social Determinants of Health   Financial Resource Strain: Low Risk  (02/26/2018)   Overall Financial Resource Strain (CARDIA)    Difficulty of Paying Living Expenses: Not hard at all  Food Insecurity: No Food Insecurity (02/26/2018)   Hunger Vital Sign    Worried About Running Out of Food in the Last Year: Never true    Ran Out of Food in the Last Year: Never true  Transportation Needs: No Transportation Needs (02/26/2018)   PRAPARE - Administrator, Civil Service (Medical): No    Lack of Transportation (Non-Medical): No  Physical Activity: Inactive (02/26/2018)   Exercise Vital Sign    Days of Exercise per Week: 0 days    Minutes of Exercise per Session: 0 min  Stress: Stress Concern Present (02/26/2018)   [employer] of Occupational Health - Occupational Stress Questionnaire    Feeling of Stress : To some extent  Social Connections: Socially Integrated (02/26/2018)   Social Connection and Isolation Panel [NHANES]    Frequency of Communication with Friends and Family: More than three times a week    Frequency of Social Gatherings with Friends and Family: Twice a week    Attends Religious Services: More than 4 times per year    Active Member of [employer] or Organizations: Yes    Attends Engineer, structural: More than 4 times per year    Marital Status: Married  Catering manager Violence: Not At Risk (02/26/2018)   Humiliation, Afraid, Rape, and Kick questionnaire    Fear of Current or Ex-Partner: No    Emotionally Abused: No    Physically Abused: No    Sexually Abused: No    Family History: Family History  Problem Relation Age of Onset    Hypertension Mother    Hypertension Father    Prostate cancer Father    Cancer Maternal Aunt        spine/back   Cancer Paternal Grandfather        lung   Healthy Son    Healthy Son    Healthy Son  Healthy Son    Colon cancer Neg Hx     Current Medications:  Current Outpatient Medications:    ALPRAZolam (XANAX) 0.5 MG tablet, Take 1 tablet (0.5 mg total) by mouth 3 (three) times daily as needed for anxiety., Disp: 90 tablet, Rfl: 5   amitriptyline (ELAVIL) 10 MG tablet, Take 1 tablet (10 mg total) by mouth 2 (two) times daily., Disp: 180 tablet, Rfl: 3   ARMOUR THYROID 60 MG tablet, Take 1 tablet (60 mg total) by mouth daily., Disp: 90 tablet, Rfl: 1   ARMOUR THYROID 60 MG tablet, Take 1 tablet (60 mg total) by mouth daily., Disp: 90 tablet, Rfl: 1   dexamethasone (DECADRON) 2 MG tablet, Take 1 tablet (2 mg total) by mouth 2 (two) times daily., Disp: 60 tablet, Rfl: 1   loperamide (IMODIUM) 2 MG capsule, Take 1 capsule (2 mg total) by mouth 3 (three) times daily. (Patient taking differently: Take 2 mg by mouth daily.), Disp: 30 capsule, Rfl: 0   pregabalin (LYRICA) 25 MG capsule, Take 1 capsule (25 mg total) by mouth 2 (two) times daily., Disp: 60 capsule, Rfl: 3   prochlorperazine (COMPAZINE) 10 MG tablet, Take 1 tablet (10 mg total) by mouth every 6 (six) hours as needed for nausea or vomiting., Disp: 30 tablet, Rfl: 4   tiZANidine (ZANAFLEX) 4 MG tablet, Take 1 tablet (4 mg total) by mouth every 6 (six) hours as needed for muscle spasms (take for tightness and pain in the scapular region)., Disp: 56 tablet, Rfl: 2 No current facility-administered medications for this visit.  Facility-Administered Medications Ordered in Other Visits:    clindamycin (CLEOCIN) 900 mg in dextrose 5 % 50 mL IVPB, 900 mg, Intravenous, 60 min Pre-Op **AND** gentamicin (GARAMYCIN) 350 mg in dextrose 5 % 50 mL IVPB, 5 mg/kg, Intravenous, 60 min Pre-Op, Romie Levee, MD   clindamycin (CLEOCIN) 900 mg in  dextrose 5 % 50 mL IVPB, 900 mg, Intravenous, 60 min Pre-Op **AND** gentamicin (GARAMYCIN) 5 mg/kg in dextrose 5 % 50 mL IVPB, 5 mg/kg, Intravenous, 60 min Pre-Op, Romie Levee, MD   sodium chloride 0.9 % 1,000 mL with potassium chloride 20 mEq, magnesium sulfate 2 g infusion, , Intravenous, Once, [name], MD   Allergies: Allergies  Allergen Reactions   Demerol [Meperidine] Itching    All over the body.   Penicillins Hives     childhood, does not remember if it spread all over the body or not. Did it involve swelling of the face/tongue/throat, SOB, or low BP? No Did it involve sudden or severe rash/hives, skin peeling, or any reaction on the inside of your mouth or nose? Yes Did you need to seek medical attention at a hospital or doctor's office? Yes When did it last happen?      childhood allergy If all above answers are "NO", may proceed with cephalosporin use.    Levaquin [Levofloxacin] Other (See Comments)    Patient has Aortic Aneurysm and is not indicated with this diagnosis   Other Hives and Other (See Comments)    Fresh coconut    Vancomycin Rash    Will need Benadryl prior to administration per IV. "Red man syndrome"    REVIEW OF SYSTEMS:   Review of Systems  Constitutional:  Negative for chills, fatigue and fever.  HENT:   Negative for lump/mass, mouth sores, nosebleeds, sore throat and trouble swallowing.   Eyes:  Negative for eye problems.  Respiratory:  Negative for cough and shortness of breath.  Cardiovascular:  Negative for chest pain, leg swelling and palpitations.  Gastrointestinal:  Positive for diarrhea and nausea. Negative for abdominal pain, constipation and vomiting.  Genitourinary:  Negative for bladder incontinence, difficulty urinating, dysuria, frequency, hematuria and nocturia.   Musculoskeletal:  Negative for arthralgias, back pain, flank pain, myalgias and neck pain.  Skin:  Negative for itching and rash.  Neurological:  Positive for  headaches. Negative for dizziness and numbness.  Hematological:  Does not bruise/bleed easily.  Psychiatric/Behavioral:  Positive for depression. Negative for sleep disturbance and suicidal ideas. The patient is nervous/anxious.   All other systems reviewed and are negative.    VITALS:   Last menstrual period 02/16/2015.  Wt Readings from Last 3 Encounters:  07/14/22 133 lb 11.2 oz (60.6 kg)  07/07/22 138 lb (62.6 kg)  07/06/22 138 lb 11.2 oz (62.9 kg)    There is no height or weight on file to calculate BMI.  Performance status (ECOG): 0 - Asymptomatic  PHYSICAL EXAM:   Physical Exam Vitals and nursing note reviewed. Exam conducted with a chaperone present.  Constitutional:      Appearance: Normal appearance.  Cardiovascular:     Rate and Rhythm: Normal rate and regular rhythm.     Pulses: Normal pulses.     Heart sounds: Normal heart sounds.  Pulmonary:     Effort: Pulmonary effort is normal.     Breath sounds: Normal breath sounds.  Abdominal:     Palpations: Abdomen is soft. There is no hepatomegaly, splenomegaly or mass.     Tenderness: There is no abdominal tenderness.  Musculoskeletal:     Right lower leg: No edema.     Left lower leg: No edema.  Lymphadenopathy:     Cervical: No cervical adenopathy.     Right cervical: No superficial, deep or posterior cervical adenopathy.    Left cervical: No superficial, deep or posterior cervical adenopathy.     Upper Body:     Right upper body: No supraclavicular or axillary adenopathy.     Left upper body: No supraclavicular or axillary adenopathy.  Neurological:     General: No focal deficit present.     Mental Status: She is alert and oriented to person, place, and time.  Psychiatric:        Mood and Affect: Mood normal.        Behavior: Behavior normal.     LABS:      Latest Ref Rng & Units 07/14/2022    8:39 AM 07/06/2022    7:59 AM 06/09/2022    8:54 AM  CBC  WBC 4.0 - 10.5 K/uL 5.7  2.5  4.2   Hemoglobin  12.0 - 15.0 g/dL 16.1  09.6  04.5   Hematocrit 36.0 - 46.0 % 32.9  32.1  31.4   Platelets 150 - 400 K/uL 124  107  106       Latest Ref Rng & Units 07/14/2022    8:39 AM 07/06/2022    7:59 AM 05/11/2022    2:00 PM  CMP  Glucose 70 - 99 mg/dL 98  97  95   BUN 6 - 20 mg/dL 24  18  17    Creatinine 0.44 - 1.00 mg/dL 4.09  8.11  9.14   Sodium 135 - 145 mmol/L 134  136  135   Potassium 3.5 - 5.1 mmol/L 3.6  3.4  3.4   Chloride 98 - 111 mmol/L 102  105  100   CO2 22 - 32 mmol/L  Calcium 8.9 - 10.3 mg/dL 8.9  8.6  8.6   Total Protein 6.5 - 8.1 g/dL 7.2  6.7  7.3   Total Bilirubin 0.3 - 1.2 mg/dL 2.5  3.3  2.5   Alkaline Phos 38 - 126 U/L 115  128  131   AST 15 - 41 U/L ALT 0 - 44 U/L Lab Results  Component Value Date   CEA1 20.1 (H) 05/11/2022   /  CEA  Date Value Ref Range Status  05/11/2022 20.1 (H) 0.0 - 4.7 ng/mL Final    Comment:    (NOTE)                             Nonsmokers          <3.9                             Smokers             <5.6 Roche [employer]ostics Electrochemiluminescence Immunoassay (ECLIA) Values obtained with different assay methods or kits cannot be used interchangeably.  Results cannot be interpreted as absolute evidence of the presence or absence of malignant disease. Performed At: [facility] [address] [place], [place] [id number] Jolene Schimke MD WU:[id number]    No results found for: "PSA1" No results found for: "CAN199" No results found for: "CAN125"  No results found for: "TOTALPROTELP", "ALBUMINELP", "A1GS", "A2GS", "BETS", "BETA2SER", "GAMS", "MSPIKE", "SPEI" Lab Results  Component Value Date   TIBC 364 05/11/2022   TIBC 385 11/09/2020   FERRITIN 61 05/11/2022   FERRITIN 54 11/09/2020   IRONPCTSAT 26 05/11/2022   IRONPCTSAT 22 11/09/2020   Lab Results  Component Value Date   LDH 154 01/20/2020     STUDIES:   MR Cervical Spine W Wo Contrast  Result Date: 07/01/2022 CLINICAL  DATA:  History of metastatic colorectal carcinoma. Neck pain radiating down right shoulder and arm with weakness and numbness for 3 months. EXAM: MRI CERVICAL SPINE WITHOUT AND WITH CONTRAST TECHNIQUE: Multiplanar and multiecho pulse sequences of the cervical spine, to include the craniocervical junction and cervicothoracic junction, were obtained without and with intravenous contrast. CONTRAST:  7mL GADAVIST GADOBUTROL 1 MMOL/ML IV SOLN COMPARISON:  Cervical spine radiographs 04/01/2022, PET-CT 05/26/2022 FINDINGS: Alignment: There is slight reversal of the normal curvature centered at C6. There is trace anterolisthesis of C4 on C5. Vertebrae: Vertebral body heights are preserved. Background marrow signal is normal. There is confluent T1 hypointensity with patchy marrow enhancement in the C6 vertebral body extending to the right posterior elements corresponding to abnormal hypermetabolic activity on recent PET (6-12). There is epidural tumor along the right aspect of the posterior endplate measuring up to 4 mm in thickness (12-3). Tumor also extends into the right C5-C6 and C6-C7 neural foramina and paraspinal soft tissues (11-2). The tumor resulting in mild spinal canal stenosis with slight indentation of the right ventral cord surface and likely affects the exiting C6 and C7 nerve roots. There is no other osseous metastatic disease in the cervical spine. Cord: Normal in signal and morphology without abnormal enhancement. Posterior Fossa, vertebral arteries, paraspinal tissues: The paraspinal soft tissues are unremarkable, aside from the paraspinal tumor described above. Signal abnormality in the right lung apex is incompletely imaged but  corresponds to patchy opacities better evaluated on recent PET. The imaged posterior fossa is unremarkable. The vertebral artery flow voids are normal. Disc levels: Mild left facet arthropathy without significant spinal canal or neural foraminal stenosis C3-C4: Mild left worse  than right uncovertebral ridging and facet arthropathy without significant spinal canal or neural foraminal stenosis C4-C5: Mild uncovertebral ridging and facet arthropathy without significant spinal canal or neural foraminal stenosis C5-C6: There is right worse than left uncovertebral ridging with a shallow posterior disc osteophyte complex resulting in mild spinal canal stenosis and mild left neural foraminal stenosis. There is right neural foraminal stenosis due to the uncovertebral ridging and extraosseous tumor as described above. C6-C7: There is right worse than left uncovertebral ridging. There is no significant left neural foraminal stenosis. There is right neural foraminal stenosis primarily due to extraosseous tumor as described above. C7-T1: No significant spinal canal or neural foraminal stenosis. IMPRESSION: 1. Osseous metastatic disease in the C6 vertebral body extending to the right posterior elements with extraosseous tumor along the posterior endplate and extending into the right C5-C6 and C6-C7 neural foramina, likely affecting the exiting C6 and C7 nerve roots. No high-grade spinal canal stenosis or cord compression. 2. No other osseous metastatic disease in the cervical spine. 3. Overall mild background degenerative changes without other evidence of nerve root impingement. Electronically Signed   By: Lesia Hausen M.D.   On: 07/01/2022 09:15

## 2022-07-14 ENCOUNTER — Inpatient Hospital Stay (HOSPITAL_BASED_OUTPATIENT_CLINIC_OR_DEPARTMENT_OTHER): Payer: 59 | Admitting: Hematology

## 2022-07-14 ENCOUNTER — Ambulatory Visit: Payer: 59

## 2022-07-14 ENCOUNTER — Other Ambulatory Visit: Payer: Self-pay

## 2022-07-14 ENCOUNTER — Inpatient Hospital Stay: Payer: 59

## 2022-07-14 ENCOUNTER — Ambulatory Visit
Admission: RE | Admit: 2022-07-14 | Discharge: 2022-07-14 | Disposition: A | Discharge status: Home / Self Care | Payer: 59 | Source: Ambulatory Visit | Attending: Radiation Oncology | Admitting: Radiation Oncology

## 2022-07-14 VITALS — BP 121/78 | HR 92 | Temp 98.8°F | Resp 18 | Wt 133.7 lb

## 2022-07-14 DIAGNOSIS — C20 Malignant neoplasm of rectum: Secondary | ICD-10-CM

## 2022-07-14 DIAGNOSIS — C7802 Secondary malignant neoplasm of left lung: Secondary | ICD-10-CM | POA: Diagnosis not present

## 2022-07-14 DIAGNOSIS — C189 Malignant neoplasm of colon, unspecified: Secondary | ICD-10-CM

## 2022-07-14 DIAGNOSIS — Z95828 Presence of other vascular implants and grafts: Secondary | ICD-10-CM

## 2022-07-14 DIAGNOSIS — Z510 Encounter for antineoplastic radiation therapy: Secondary | ICD-10-CM | POA: Diagnosis not present

## 2022-07-14 LAB — RAD ONC ARIA SESSION SUMMARY
Course Elapsed Days: 0
Plan Fractions Treated to Date: 1
Plan Prescribed Dose Per Fraction: 2.5 Gy
Plan Total Fractions Prescribed: 15
Plan Total Prescribed Dose: 37.5 Gy
Reference Point Dosage Given to Date: 2.5 Gy
Reference Point Session Dosage Given: 2.5 Gy
Session Number: 1

## 2022-07-14 LAB — COMPREHENSIVE METABOLIC PANEL
ALT: 19 U/L (ref 0–44)
AST: 20 U/L (ref 15–41)
Albumin: 4.1 g/dL (ref 3.5–5.0)
Alkaline Phosphatase: 115 U/L (ref 38–126)
Anion gap: 10 (ref 5–15)
BUN: 24 mg/dL — ABNORMAL HIGH (ref 6–20)
CO2: 22 mmol/L (ref 22–32)
Calcium: 8.9 mg/dL (ref 8.9–10.3)
Chloride: 102 mmol/L (ref 98–111)
Creatinine, Ser: 0.7 mg/dL (ref 0.44–1.00)
GFR, Estimated: >60 mL/min (ref >60)
Glucose, Bld: 98 mg/dL (ref 70–99)
Potassium: 3.6 mmol/L (ref 3.5–5.1)
Sodium: 134 mmol/L — ABNORMAL LOW (ref 135–145)
Total Bilirubin: 2.5 mg/dL — ABNORMAL HIGH (ref 0.3–1.2)
Total Protein: 7.2 g/dL (ref 6.5–8.1)

## 2022-07-14 LAB — CBC WITH DIFFERENTIAL/PLATELET
Abs Immature Granulocytes: 0.04 10*3/uL (ref 0.00–0.07)
Basophils Absolute: 0 10*3/uL (ref 0.0–0.1)
Basophils Relative: 0 %
Eosinophils Absolute: 0 10*3/uL (ref 0.0–0.5)
Eosinophils Relative: 0 %
HCT: 32.9 % — ABNORMAL LOW (ref 36.0–46.0)
Hemoglobin: 11.8 g/dL — ABNORMAL LOW (ref 12.0–15.0)
Immature Granulocytes: 1 %
Lymphocytes Relative: 13 %
Lymphs Abs: 0.7 10*3/uL (ref 0.7–4.0)
MCH: 34.6 pg — ABNORMAL HIGH (ref 26.0–34.0)
MCHC: 35.9 g/dL (ref 30.0–36.0)
MCV: 96.5 fL (ref 80.0–100.0)
Monocytes Absolute: 0.3 10*3/uL (ref 0.1–1.0)
Monocytes Relative: 4 %
Neutro Abs: 4.7 10*3/uL (ref 1.7–7.7)
Neutrophils Relative %: 82 %
Platelets: 124 10*3/uL — ABNORMAL LOW (ref 150–400)
RBC: 3.41 MIL/uL — ABNORMAL LOW (ref 3.87–5.11)
RDW: 12.6 % (ref 11.5–15.5)
WBC: 5.7 10*3/uL (ref 4.0–10.5)
nRBC: 0 % (ref 0.0–0.2)

## 2022-07-14 LAB — MAGNESIUM: Magnesium: 2.2 mg/dL (ref 1.7–2.4)

## 2022-07-14 MED ORDER — SODIUM CHLORIDE 0.9% FLUSH
10.0000 mL | Freq: Once | INTRAVENOUS | Status: AC
  Administered 2022-07-14: 10 mL via INTRAVENOUS

## 2022-07-14 MED ORDER — SODIUM CHLORIDE 0.9% FLUSH: 10.0000 mL | INTRAVENOUS | Status: DC | PRN

## 2022-07-14 MED ORDER — HEPARIN SOD (PORK) LOCK FLUSH 100 UNIT/ML IV SOLN
500.0000 [IU] | Freq: Once | INTRAVENOUS | Status: AC
  Administered 2022-07-14: 500 [IU] via INTRAVENOUS

## 2022-07-14 NOTE — Progress Notes (Signed)
No IVF today per Dr. Ellin Saba.  Patients port flushed without difficulty.  Good blood return noted with no bruising or swelling noted at site.  Band aid applied.  VSS with discharge and left in satisfactory condition with no s/s of distress noted.

## 2022-07-14 NOTE — Patient Instructions (Addendum)
[facility] at [facility] Discharge Instructions   You were seen and examined today by Dr. Ellin Saba.  He reviewed the results of your lab work which are mostly normal/stable. Your bilirubin is 2.5. Your hemoglobin is 11.9, slightly low. All other results are normal.   We will hold treatment until you complete radiation. We will plan to restart treatment the week of 5/13.   Return as scheduled.    Thank you for choosing [facility] at [facility] to provide your oncology and hematology care.  To afford each patient quality time with our provider, please arrive at least 15 minutes before your scheduled appointment time.   If you have a lab appointment with the Cancer Center please come in thru the Main Entrance and check in at the main information desk.  You need to re-schedule your appointment should you arrive 10 or more minutes late.  We strive to give you quality time with our providers, and arriving late affects you and other patients whose appointments are after yours.  Also, if you no show three or more times for appointments you may be dismissed from the clinic at the providers discretion.     Again, thank you for choosing [facility].  Our hope is that these requests will decrease the amount of time that you wait before being seen by our physicians.       _____________________________________________________________  Should you have questions after your visit to [facility], please contact our office at [phone] and follow the prompts.  Our office hours are 8:00 a.m. and 4:30 p.m. Monday - Friday.  Please note that voicemails left after 4:00 p.m. may not be returned until the following business day.  We are closed weekends and major holidays.  You do have access to a nurse 24-7, just call the main number to the clinic [phone] and do not press any options, hold on the line and a nurse will answer the phone.     For prescription refill requests, have your pharmacy contact our office and allow 72 hours.    Due to Covid, you will need to wear a mask upon entering the hospital. If you do not have a mask, a mask will be given to you at the Main Entrance upon arrival. For doctor visits, patients may have 1 support person age 25 or older with them. For treatment visits, patients can not have anyone with them due to social distancing guidelines and our immunocompromised population.

## 2022-07-15 ENCOUNTER — Other Ambulatory Visit: Payer: Self-pay

## 2022-07-15 ENCOUNTER — Ambulatory Visit: Payer: 59 | Admitting: Orthopedic Surgery

## 2022-07-15 ENCOUNTER — Ambulatory Visit
Admission: RE | Admit: 2022-07-15 | Discharge: 2022-07-15 | Disposition: A | Discharge status: Home / Self Care | Payer: 59 | Source: Ambulatory Visit | Attending: Radiation Oncology | Admitting: Radiation Oncology

## 2022-07-15 DIAGNOSIS — C7802 Secondary malignant neoplasm of left lung: Secondary | ICD-10-CM | POA: Diagnosis not present

## 2022-07-15 DIAGNOSIS — Z510 Encounter for antineoplastic radiation therapy: Secondary | ICD-10-CM | POA: Diagnosis not present

## 2022-07-15 DIAGNOSIS — C20 Malignant neoplasm of rectum: Secondary | ICD-10-CM | POA: Diagnosis not present

## 2022-07-15 LAB — RAD ONC ARIA SESSION SUMMARY
Course Elapsed Days: 1
Plan Fractions Treated to Date: 2
Plan Prescribed Dose Per Fraction: 2.5 Gy
Plan Total Fractions Prescribed: 15
Plan Total Prescribed Dose: 37.5 Gy
Reference Point Dosage Given to Date: 5 Gy
Reference Point Session Dosage Given: 2.5 Gy
Session Number: 2

## 2022-07-18 ENCOUNTER — Other Ambulatory Visit: Payer: Self-pay

## 2022-07-18 ENCOUNTER — Ambulatory Visit
Admission: RE | Admit: 2022-07-18 | Discharge: 2022-07-18 | Disposition: A | Discharge status: Home / Self Care | Payer: 59 | Source: Ambulatory Visit | Attending: Radiation Oncology | Admitting: Radiation Oncology

## 2022-07-18 DIAGNOSIS — Z510 Encounter for antineoplastic radiation therapy: Secondary | ICD-10-CM | POA: Diagnosis not present

## 2022-07-18 DIAGNOSIS — C7802 Secondary malignant neoplasm of left lung: Secondary | ICD-10-CM | POA: Diagnosis not present

## 2022-07-18 DIAGNOSIS — C20 Malignant neoplasm of rectum: Secondary | ICD-10-CM | POA: Diagnosis not present

## 2022-07-18 LAB — RAD ONC ARIA SESSION SUMMARY
Course Elapsed Days: 4
Plan Fractions Treated to Date: 3
Plan Prescribed Dose Per Fraction: 2.5 Gy
Plan Total Fractions Prescribed: 15
Plan Total Prescribed Dose: 37.5 Gy
Reference Point Dosage Given to Date: 7.5 Gy
Reference Point Session Dosage Given: 2.5 Gy
Session Number: 3

## 2022-07-19 ENCOUNTER — Ambulatory Visit
Admission: RE | Admit: 2022-07-19 | Discharge: 2022-07-19 | Disposition: A | Discharge status: Home / Self Care | Payer: 59 | Source: Ambulatory Visit | Attending: Radiation Oncology | Admitting: Radiation Oncology

## 2022-07-19 ENCOUNTER — Other Ambulatory Visit: Payer: Self-pay

## 2022-07-19 DIAGNOSIS — Z510 Encounter for antineoplastic radiation therapy: Secondary | ICD-10-CM | POA: Diagnosis not present

## 2022-07-19 DIAGNOSIS — C20 Malignant neoplasm of rectum: Secondary | ICD-10-CM | POA: Diagnosis not present

## 2022-07-19 DIAGNOSIS — C7802 Secondary malignant neoplasm of left lung: Secondary | ICD-10-CM | POA: Diagnosis not present

## 2022-07-19 LAB — RAD ONC ARIA SESSION SUMMARY
Course Elapsed Days: 5
Plan Fractions Treated to Date: 4
Plan Prescribed Dose Per Fraction: 2.5 Gy
Plan Total Fractions Prescribed: 15
Plan Total Prescribed Dose: 37.5 Gy
Reference Point Dosage Given to Date: 10 Gy
Reference Point Session Dosage Given: 2.5 Gy
Session Number: 4

## 2022-07-20 ENCOUNTER — Other Ambulatory Visit: Payer: Self-pay

## 2022-07-20 ENCOUNTER — Inpatient Hospital Stay: Payer: 59

## 2022-07-20 ENCOUNTER — Ambulatory Visit
Admission: RE | Admit: 2022-07-20 | Discharge: 2022-07-20 | Disposition: A | Discharge status: Home / Self Care | Payer: 59 | Source: Ambulatory Visit | Attending: Radiation Oncology | Admitting: Radiation Oncology

## 2022-07-20 ENCOUNTER — Inpatient Hospital Stay: Payer: 59 | Admitting: Hematology

## 2022-07-20 DIAGNOSIS — C20 Malignant neoplasm of rectum: Secondary | ICD-10-CM | POA: Diagnosis not present

## 2022-07-20 DIAGNOSIS — Z510 Encounter for antineoplastic radiation therapy: Secondary | ICD-10-CM | POA: Diagnosis not present

## 2022-07-20 DIAGNOSIS — C7802 Secondary malignant neoplasm of left lung: Secondary | ICD-10-CM | POA: Diagnosis not present

## 2022-07-20 LAB — RAD ONC ARIA SESSION SUMMARY
Course Elapsed Days: 6
Plan Fractions Treated to Date: 5
Plan Prescribed Dose Per Fraction: 2.5 Gy
Plan Total Fractions Prescribed: 15
Plan Total Prescribed Dose: 37.5 Gy
Reference Point Dosage Given to Date: 12.5 Gy
Reference Point Session Dosage Given: 2.5 Gy
Session Number: 5

## 2022-07-21 ENCOUNTER — Other Ambulatory Visit: Payer: Self-pay

## 2022-07-21 ENCOUNTER — Ambulatory Visit
Admission: RE | Admit: 2022-07-21 | Discharge: 2022-07-21 | Disposition: A | Discharge status: Home / Self Care | Payer: 59 | Source: Ambulatory Visit | Attending: Radiation Oncology | Admitting: Radiation Oncology

## 2022-07-21 DIAGNOSIS — C20 Malignant neoplasm of rectum: Secondary | ICD-10-CM | POA: Diagnosis not present

## 2022-07-21 DIAGNOSIS — C7802 Secondary malignant neoplasm of left lung: Secondary | ICD-10-CM | POA: Diagnosis not present

## 2022-07-21 DIAGNOSIS — Z510 Encounter for antineoplastic radiation therapy: Secondary | ICD-10-CM | POA: Diagnosis not present

## 2022-07-21 LAB — RAD ONC ARIA SESSION SUMMARY
Course Elapsed Days: 7
Plan Fractions Treated to Date: 6
Plan Prescribed Dose Per Fraction: 2.5 Gy
Plan Total Fractions Prescribed: 15
Plan Total Prescribed Dose: 37.5 Gy
Reference Point Dosage Given to Date: 15 Gy
Reference Point Session Dosage Given: 2.5 Gy
Session Number: 6

## 2022-07-22 ENCOUNTER — Ambulatory Visit
Admission: RE | Admit: 2022-07-22 | Discharge: 2022-07-22 | Disposition: A | Discharge status: Home / Self Care | Payer: 59 | Source: Ambulatory Visit | Attending: Radiation Oncology | Admitting: Radiation Oncology

## 2022-07-22 ENCOUNTER — Other Ambulatory Visit: Payer: Self-pay

## 2022-07-22 ENCOUNTER — Inpatient Hospital Stay: Payer: 59

## 2022-07-22 DIAGNOSIS — C7802 Secondary malignant neoplasm of left lung: Secondary | ICD-10-CM | POA: Diagnosis not present

## 2022-07-22 DIAGNOSIS — Z510 Encounter for antineoplastic radiation therapy: Secondary | ICD-10-CM | POA: Diagnosis not present

## 2022-07-22 DIAGNOSIS — C20 Malignant neoplasm of rectum: Secondary | ICD-10-CM | POA: Diagnosis not present

## 2022-07-22 LAB — RAD ONC ARIA SESSION SUMMARY
Course Elapsed Days: 8
Plan Fractions Treated to Date: 7
Plan Prescribed Dose Per Fraction: 2.5 Gy
Plan Total Fractions Prescribed: 15
Plan Total Prescribed Dose: 37.5 Gy
Reference Point Dosage Given to Date: 17.5 Gy
Reference Point Session Dosage Given: 2.5 Gy
Session Number: 7

## 2022-07-25 ENCOUNTER — Other Ambulatory Visit: Payer: Self-pay

## 2022-07-25 ENCOUNTER — Ambulatory Visit
Admission: RE | Admit: 2022-07-25 | Discharge: 2022-07-25 | Disposition: A | Discharge status: Home / Self Care | Payer: 59 | Source: Ambulatory Visit | Attending: Radiation Oncology | Admitting: Radiation Oncology

## 2022-07-25 DIAGNOSIS — C20 Malignant neoplasm of rectum: Secondary | ICD-10-CM | POA: Diagnosis not present

## 2022-07-25 DIAGNOSIS — C7802 Secondary malignant neoplasm of left lung: Secondary | ICD-10-CM | POA: Diagnosis not present

## 2022-07-25 DIAGNOSIS — Z510 Encounter for antineoplastic radiation therapy: Secondary | ICD-10-CM | POA: Diagnosis not present

## 2022-07-25 LAB — RAD ONC ARIA SESSION SUMMARY
Course Elapsed Days: 11
Plan Fractions Treated to Date: 8
Plan Prescribed Dose Per Fraction: 2.5 Gy
Plan Total Fractions Prescribed: 15
Plan Total Prescribed Dose: 37.5 Gy
Reference Point Dosage Given to Date: 20 Gy
Reference Point Session Dosage Given: 2.5 Gy
Session Number: 8

## 2022-07-26 ENCOUNTER — Ambulatory Visit
Admission: RE | Admit: 2022-07-26 | Discharge: 2022-07-26 | Disposition: A | Discharge status: Home / Self Care | Payer: 59 | Source: Ambulatory Visit | Attending: Radiation Oncology | Admitting: Radiation Oncology

## 2022-07-26 ENCOUNTER — Other Ambulatory Visit (HOSPITAL_COMMUNITY): Payer: Self-pay

## 2022-07-26 ENCOUNTER — Other Ambulatory Visit: Payer: Self-pay

## 2022-07-26 ENCOUNTER — Other Ambulatory Visit: Payer: Self-pay | Admitting: Radiation Oncology

## 2022-07-26 DIAGNOSIS — C7802 Secondary malignant neoplasm of left lung: Secondary | ICD-10-CM | POA: Diagnosis not present

## 2022-07-26 DIAGNOSIS — Z510 Encounter for antineoplastic radiation therapy: Secondary | ICD-10-CM | POA: Diagnosis not present

## 2022-07-26 DIAGNOSIS — C20 Malignant neoplasm of rectum: Secondary | ICD-10-CM | POA: Diagnosis not present

## 2022-07-26 LAB — RAD ONC ARIA SESSION SUMMARY
Course Elapsed Days: 12
Plan Fractions Treated to Date: 9
Plan Prescribed Dose Per Fraction: 2.5 Gy
Plan Total Fractions Prescribed: 15
Plan Total Prescribed Dose: 37.5 Gy
Reference Point Dosage Given to Date: 22.5 Gy
Reference Point Session Dosage Given: 2.5 Gy
Session Number: 9

## 2022-07-26 MED ORDER — SUCRALFATE 1 G PO TABS
1.0000 g | ORAL_TABLET | Freq: Three times a day (TID) | ORAL | 2 refills | Status: DC
  Filled 2022-07-26: qty 120, 30d supply, fill #0

## 2022-07-26 NOTE — Progress Notes (Signed)
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 She's completed 8 fractions and requested medication as it's uncomfortable to swallow. A new prescription for carafate was sent to her pharmacy and I sent back a secure email with this information, and encouraged her to switch to softer foods to see if that also helps. She has also tapered to dexamethasone 2 mg once a day (previously twice a day) since her pain in her neck has improved. I encouraged her to keep [name] aware of how that is going and we can consider further taper as well next week.

## 2022-07-27 ENCOUNTER — Ambulatory Visit: Payer: 59 | Admitting: Radiation Oncology

## 2022-07-27 ENCOUNTER — Other Ambulatory Visit: Payer: Self-pay

## 2022-07-27 ENCOUNTER — Ambulatory Visit
Admission: RE | Admit: 2022-07-27 | Discharge: 2022-07-27 | Disposition: A | Discharge status: Home / Self Care | Payer: 59 | Source: Ambulatory Visit | Attending: Radiation Oncology | Admitting: Radiation Oncology

## 2022-07-27 DIAGNOSIS — C20 Malignant neoplasm of rectum: Secondary | ICD-10-CM | POA: Diagnosis not present

## 2022-07-27 DIAGNOSIS — C7951 Secondary malignant neoplasm of bone: Secondary | ICD-10-CM | POA: Insufficient documentation

## 2022-07-27 DIAGNOSIS — C7800 Secondary malignant neoplasm of unspecified lung: Secondary | ICD-10-CM | POA: Insufficient documentation

## 2022-07-27 DIAGNOSIS — C7949 Secondary malignant neoplasm of other parts of nervous system: Secondary | ICD-10-CM | POA: Insufficient documentation

## 2022-07-27 DIAGNOSIS — C7931 Secondary malignant neoplasm of brain: Secondary | ICD-10-CM | POA: Insufficient documentation

## 2022-07-27 DIAGNOSIS — Z923 Personal history of irradiation: Secondary | ICD-10-CM | POA: Insufficient documentation

## 2022-07-27 DIAGNOSIS — C7802 Secondary malignant neoplasm of left lung: Secondary | ICD-10-CM | POA: Diagnosis not present

## 2022-07-27 DIAGNOSIS — Z510 Encounter for antineoplastic radiation therapy: Secondary | ICD-10-CM | POA: Insufficient documentation

## 2022-07-27 DIAGNOSIS — C787 Secondary malignant neoplasm of liver and intrahepatic bile duct: Secondary | ICD-10-CM | POA: Insufficient documentation

## 2022-07-27 LAB — RAD ONC ARIA SESSION SUMMARY
Course Elapsed Days: 13
Plan Fractions Treated to Date: 10
Plan Prescribed Dose Per Fraction: 2.5 Gy
Plan Total Fractions Prescribed: 15
Plan Total Prescribed Dose: 37.5 Gy
Reference Point Dosage Given to Date: 25 Gy
Reference Point Session Dosage Given: 2.5 Gy
Session Number: 10

## 2022-07-28 ENCOUNTER — Ambulatory Visit
Admission: RE | Admit: 2022-07-28 | Discharge: 2022-07-28 | Disposition: A | Discharge status: Home / Self Care | Payer: 59 | Source: Ambulatory Visit | Attending: Radiation Oncology | Admitting: Radiation Oncology

## 2022-07-28 ENCOUNTER — Other Ambulatory Visit: Payer: Self-pay

## 2022-07-28 DIAGNOSIS — C7800 Secondary malignant neoplasm of unspecified lung: Secondary | ICD-10-CM | POA: Diagnosis not present

## 2022-07-28 DIAGNOSIS — C7951 Secondary malignant neoplasm of bone: Secondary | ICD-10-CM | POA: Diagnosis not present

## 2022-07-28 DIAGNOSIS — C7802 Secondary malignant neoplasm of left lung: Secondary | ICD-10-CM | POA: Diagnosis not present

## 2022-07-28 DIAGNOSIS — C20 Malignant neoplasm of rectum: Secondary | ICD-10-CM | POA: Diagnosis not present

## 2022-07-28 DIAGNOSIS — Z510 Encounter for antineoplastic radiation therapy: Secondary | ICD-10-CM | POA: Diagnosis not present

## 2022-07-28 DIAGNOSIS — C787 Secondary malignant neoplasm of liver and intrahepatic bile duct: Secondary | ICD-10-CM | POA: Diagnosis not present

## 2022-07-28 DIAGNOSIS — Z923 Personal history of irradiation: Secondary | ICD-10-CM | POA: Diagnosis not present

## 2022-07-28 DIAGNOSIS — C7931 Secondary malignant neoplasm of brain: Secondary | ICD-10-CM | POA: Diagnosis not present

## 2022-07-28 LAB — RAD ONC ARIA SESSION SUMMARY
Course Elapsed Days: 14
Plan Fractions Treated to Date: 11
Plan Prescribed Dose Per Fraction: 2.5 Gy
Plan Total Fractions Prescribed: 15
Plan Total Prescribed Dose: 37.5 Gy
Reference Point Dosage Given to Date: 27.5 Gy
Reference Point Session Dosage Given: 2.5 Gy
Session Number: 11

## 2022-07-29 ENCOUNTER — Ambulatory Visit: Payer: 59 | Admitting: Internal Medicine

## 2022-07-29 ENCOUNTER — Other Ambulatory Visit: Payer: Self-pay

## 2022-07-29 ENCOUNTER — Ambulatory Visit
Admission: RE | Admit: 2022-07-29 | Discharge: 2022-07-29 | Disposition: A | Discharge status: Home / Self Care | Payer: 59 | Source: Ambulatory Visit | Attending: Radiation Oncology | Admitting: Radiation Oncology

## 2022-07-29 DIAGNOSIS — C7931 Secondary malignant neoplasm of brain: Secondary | ICD-10-CM | POA: Diagnosis not present

## 2022-07-29 DIAGNOSIS — C787 Secondary malignant neoplasm of liver and intrahepatic bile duct: Secondary | ICD-10-CM | POA: Diagnosis not present

## 2022-07-29 DIAGNOSIS — C7802 Secondary malignant neoplasm of left lung: Secondary | ICD-10-CM | POA: Diagnosis not present

## 2022-07-29 DIAGNOSIS — Z923 Personal history of irradiation: Secondary | ICD-10-CM | POA: Diagnosis not present

## 2022-07-29 DIAGNOSIS — Z510 Encounter for antineoplastic radiation therapy: Secondary | ICD-10-CM | POA: Diagnosis not present

## 2022-07-29 DIAGNOSIS — C20 Malignant neoplasm of rectum: Secondary | ICD-10-CM | POA: Diagnosis not present

## 2022-07-29 DIAGNOSIS — C7800 Secondary malignant neoplasm of unspecified lung: Secondary | ICD-10-CM | POA: Diagnosis not present

## 2022-07-29 DIAGNOSIS — C7951 Secondary malignant neoplasm of bone: Secondary | ICD-10-CM | POA: Diagnosis not present

## 2022-07-29 LAB — RAD ONC ARIA SESSION SUMMARY
Course Elapsed Days: 15
Plan Fractions Treated to Date: 12
Plan Prescribed Dose Per Fraction: 2.5 Gy
Plan Total Fractions Prescribed: 15
Plan Total Prescribed Dose: 37.5 Gy
Reference Point Dosage Given to Date: 30 Gy
Reference Point Session Dosage Given: 2.5 Gy
Session Number: 12

## 2022-08-01 ENCOUNTER — Other Ambulatory Visit: Payer: Self-pay

## 2022-08-01 ENCOUNTER — Ambulatory Visit
Admission: RE | Admit: 2022-08-01 | Discharge: 2022-08-01 | Disposition: A | Discharge status: Home / Self Care | Payer: 59 | Source: Ambulatory Visit | Attending: Radiation Oncology | Admitting: Radiation Oncology

## 2022-08-01 DIAGNOSIS — Z923 Personal history of irradiation: Secondary | ICD-10-CM | POA: Diagnosis not present

## 2022-08-01 DIAGNOSIS — C787 Secondary malignant neoplasm of liver and intrahepatic bile duct: Secondary | ICD-10-CM | POA: Diagnosis not present

## 2022-08-01 DIAGNOSIS — C20 Malignant neoplasm of rectum: Secondary | ICD-10-CM | POA: Diagnosis not present

## 2022-08-01 DIAGNOSIS — C7931 Secondary malignant neoplasm of brain: Secondary | ICD-10-CM | POA: Diagnosis not present

## 2022-08-01 DIAGNOSIS — Z510 Encounter for antineoplastic radiation therapy: Secondary | ICD-10-CM | POA: Diagnosis not present

## 2022-08-01 DIAGNOSIS — C7800 Secondary malignant neoplasm of unspecified lung: Secondary | ICD-10-CM | POA: Diagnosis not present

## 2022-08-01 DIAGNOSIS — C7802 Secondary malignant neoplasm of left lung: Secondary | ICD-10-CM | POA: Diagnosis not present

## 2022-08-01 DIAGNOSIS — C7951 Secondary malignant neoplasm of bone: Secondary | ICD-10-CM | POA: Diagnosis not present

## 2022-08-01 LAB — RAD ONC ARIA SESSION SUMMARY
Course Elapsed Days: 18
Plan Fractions Treated to Date: 13
Plan Prescribed Dose Per Fraction: 2.5 Gy
Plan Total Fractions Prescribed: 15
Plan Total Prescribed Dose: 37.5 Gy
Reference Point Dosage Given to Date: 32.5 Gy
Reference Point Session Dosage Given: 2.5 Gy
Session Number: 13

## 2022-08-02 ENCOUNTER — Ambulatory Visit: Payer: 59

## 2022-08-02 ENCOUNTER — Ambulatory Visit
Admission: RE | Admit: 2022-08-02 | Discharge: 2022-08-02 | Disposition: A | Discharge status: Home / Self Care | Payer: 59 | Source: Ambulatory Visit | Attending: Radiation Oncology | Admitting: Radiation Oncology

## 2022-08-02 ENCOUNTER — Other Ambulatory Visit: Payer: Self-pay

## 2022-08-02 DIAGNOSIS — C20 Malignant neoplasm of rectum: Secondary | ICD-10-CM | POA: Diagnosis not present

## 2022-08-02 DIAGNOSIS — Z923 Personal history of irradiation: Secondary | ICD-10-CM | POA: Diagnosis not present

## 2022-08-02 DIAGNOSIS — C7931 Secondary malignant neoplasm of brain: Secondary | ICD-10-CM | POA: Diagnosis not present

## 2022-08-02 DIAGNOSIS — C7800 Secondary malignant neoplasm of unspecified lung: Secondary | ICD-10-CM | POA: Diagnosis not present

## 2022-08-02 DIAGNOSIS — C787 Secondary malignant neoplasm of liver and intrahepatic bile duct: Secondary | ICD-10-CM | POA: Diagnosis not present

## 2022-08-02 DIAGNOSIS — C7802 Secondary malignant neoplasm of left lung: Secondary | ICD-10-CM | POA: Diagnosis not present

## 2022-08-02 DIAGNOSIS — C7951 Secondary malignant neoplasm of bone: Secondary | ICD-10-CM | POA: Diagnosis not present

## 2022-08-02 DIAGNOSIS — Z510 Encounter for antineoplastic radiation therapy: Secondary | ICD-10-CM | POA: Diagnosis not present

## 2022-08-02 LAB — RAD ONC ARIA SESSION SUMMARY
Course Elapsed Days: 19
Plan Fractions Treated to Date: 14
Plan Prescribed Dose Per Fraction: 2.5 Gy
Plan Total Fractions Prescribed: 15
Plan Total Prescribed Dose: 37.5 Gy
Reference Point Dosage Given to Date: 35 Gy
Reference Point Session Dosage Given: 2.5 Gy
Session Number: 14

## 2022-08-03 ENCOUNTER — Other Ambulatory Visit: Payer: Self-pay

## 2022-08-03 ENCOUNTER — Ambulatory Visit: Payer: 59

## 2022-08-03 ENCOUNTER — Ambulatory Visit: Payer: 59 | Admitting: Hematology

## 2022-08-03 ENCOUNTER — Other Ambulatory Visit: Payer: 59

## 2022-08-03 ENCOUNTER — Ambulatory Visit
Admission: RE | Admit: 2022-08-03 | Discharge: 2022-08-03 | Disposition: A | Discharge status: Home / Self Care | Payer: 59 | Source: Ambulatory Visit | Attending: Radiation Oncology | Admitting: Radiation Oncology

## 2022-08-03 DIAGNOSIS — C7802 Secondary malignant neoplasm of left lung: Secondary | ICD-10-CM | POA: Diagnosis not present

## 2022-08-03 DIAGNOSIS — C7951 Secondary malignant neoplasm of bone: Secondary | ICD-10-CM | POA: Diagnosis not present

## 2022-08-03 DIAGNOSIS — Z510 Encounter for antineoplastic radiation therapy: Secondary | ICD-10-CM | POA: Diagnosis not present

## 2022-08-03 DIAGNOSIS — C20 Malignant neoplasm of rectum: Secondary | ICD-10-CM | POA: Diagnosis not present

## 2022-08-03 DIAGNOSIS — C7931 Secondary malignant neoplasm of brain: Secondary | ICD-10-CM | POA: Diagnosis not present

## 2022-08-03 DIAGNOSIS — Z923 Personal history of irradiation: Secondary | ICD-10-CM | POA: Diagnosis not present

## 2022-08-03 DIAGNOSIS — C787 Secondary malignant neoplasm of liver and intrahepatic bile duct: Secondary | ICD-10-CM | POA: Diagnosis not present

## 2022-08-03 DIAGNOSIS — C7800 Secondary malignant neoplasm of unspecified lung: Secondary | ICD-10-CM | POA: Diagnosis not present

## 2022-08-03 LAB — RAD ONC ARIA SESSION SUMMARY
Course Elapsed Days: 20
Plan Fractions Treated to Date: 15
Plan Prescribed Dose Per Fraction: 2.5 Gy
Plan Total Fractions Prescribed: 15
Plan Total Prescribed Dose: 37.5 Gy
Reference Point Dosage Given to Date: 37.5 Gy
Reference Point Session Dosage Given: 2.5 Gy
Session Number: 15

## 2022-08-04 ENCOUNTER — Other Ambulatory Visit: Payer: Self-pay

## 2022-08-05 ENCOUNTER — Other Ambulatory Visit: Payer: Self-pay | Admitting: Radiation Therapy

## 2022-08-05 DIAGNOSIS — C787 Secondary malignant neoplasm of liver and intrahepatic bile duct: Secondary | ICD-10-CM | POA: Diagnosis not present

## 2022-08-05 DIAGNOSIS — C7951 Secondary malignant neoplasm of bone: Secondary | ICD-10-CM

## 2022-08-05 DIAGNOSIS — C20 Malignant neoplasm of rectum: Secondary | ICD-10-CM | POA: Diagnosis not present

## 2022-08-08 NOTE — Radiation Completion Notes (Addendum)
  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   Intent: Palliative     ==========DELIVERED PLANS==========  First Treatment Date: 2022-07-14 - Last Treatment Date: 2022-08-03   Plan Name: Spine_C Site: Cervical Spine Technique: 3D Mode: Photon Dose Per Fraction: 2.5 Gy Prescribed Dose (Delivered / Prescribed): 37.5 Gy / 37.5 Gy Prescribed Fxs (Delivered / Prescribed): 15 / 15     ==========ON TREATMENT VISIT DATES========== 2022-07-15, 2022-07-22, 2022-07-28     See weekly On Treatment Notes is Epic for details. The patient tolerated radiation. She developed fatigue and numbness of her face while trying to taper steroids she was using for pain relief and protection of her spinal canal. She will undergo an MRI on 08/18/22 to evaluate the brain as well.  The patient will receive a call in about one month from the radiation oncology department. She will continue follow up with Dr. Ellin Saba as well.      Osker Mason, PAC

## 2022-08-09 NOTE — Progress Notes (Signed)
[facility] [address] [place] [address]    Clinic Day:  08/10/2022  Referring physician: Babs Sciara, MD  Patient Care Team: Babs Sciara, MD as PCP - General (Family Medicine) [name], MD as Medical Oncologist (Medical Oncology)   ASSESSMENT & PLAN:   Assessment: 1.  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  Liver biopsy on 03/09/2018 consistent with adenocarcinoma. 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 -7 cycles of FOLFOX with vectibix completed on 06/27/2018. -XRT with Xeloda from 07/30/2018 through 09/06/2018. -Right liver lesion microwave ablation on 10/15/2018 at [facility]. -Low anterior resection and diverting ileostomy on 12/07/2018, pathology [id number], 0/6 lymph nodes positive, margins negative. -Loop ileostomy reversal on 04/17/2019. - Microwave ablation of the right hepatic lobe lesion by Dr. Selena Batten around 08/15/2019. -SBRT to the left lower lobe lung lesion and left liver lobe lesion on 12/13/2019 at [facility]. - Right upper lobe lesion SBRT from 08/06/2020 through 08/13/2020. - 3 left lung lesions and subcarinal lymph node IMRT from 09/21/2021 through 10/05/2021. - Liver biopsy (06/09/2022): Metastatic moderately differentiated colonic adenocarcinoma. - NGS: KRAS/NRAS/BRAF wild-type, MSI-stable, HER2-0, negative for other targetable mutations - Cycle 1 of FOLFOX and bevacizumab on 07/06/2022 - XRT to the right neck from 07/14/2022 through 08/03/2022    Plan: 1.  Stage IV rectal adenocarcinoma, BRAF/RAS wild-type, MSI-stable: - She completed XRT on 08/03/2022. - When steroids were being tapered, she developed left facial numbness in the maxillary division of the trigeminal nerve.  Hoarseness is improving.  Trouble swallowing is also improving. - MRI of the brain was ordered by radiation oncology. - She reports left hand fingertip numbness on and off for  the last couple of weeks. - Reviewed labs today: LFTs normal with elevated bilirubin.  CBC grossly normal. - Given the new neurological symptoms, I would hold oxaliplatin today.  She will proceed with 5-FU and bevacizumab.  RTC 2 weeks for follow-up.   2.  Hyperbilirubinemia: - She has Gilbert's syndrome.  Bilirubin is 3.9 today.   3.  Normocytic anemia: - Hemoglobin is 11.3.  Will monitor ferritin and iron panel intermittently.   4.  Leukopenia and thrombocytopenia: - Intermittent leukopenia and mild thrombocytopenia from splenomegaly.   5.  C6 vertebral body metastasis: - XRT completed on 08/03/2022.  Pain improved.    No orders of the defined types were placed in this encounter.     I,Katie Daubenspeck,acting as a Neurosurgeon for [name], MD.,have documented all relevant documentation on the behalf of [name], MD,as directed by  [name], MD while in the presence of [name], MD.   I, [name] MD, have reviewed the above documentation for accuracy and completeness, and I agree with the above.   [name], MD   5/15/20245:55 PM  CHIEF COMPLAINT:   Diagnosis: metastatic rectal cancer to liver    Cancer Staging  Malignant neoplasm of rectum [facility]) Staging form: Colon and Rectum, AJCC 8th Edition - Clinical stage from 03/13/2018: Stage IVA (cT3, cN1b, cM1a) - Signed by [name], MD on 03/13/2018    Prior Therapy: 1. FOLFOX & vectibix x 7 cycles from 03/14/2018 to 06/27/2018. 2. XRT with Xeloda from 07/30/2018 to 09/06/2018. 3. Right liver lesion microwave ablation on 10/15/2018. 4. Low anterior resection and diverting ileostomy on 12/07/2018. 5. SBRT to liver 60 Gy in 5 fractions from 11/15/2019 to 12/13/2019.  Current Therapy:  FOLFOX and bevacizumab  HISTORY OF PRESENT ILLNESS:   Oncology History  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HCC)  02/26/2018 Initial Diagnosis   Rectal cancer (HCC)    03/13/2018 Cancer Staging   Staging form: Colon and Rectum, AJCC 8th Edition - Clinical stage from 03/13/2018: Stage IVA (cT3, cN1b, cM1a) - Signed by [name], MD on 03/13/2018   03/14/2018 - 06/29/2018 Chemotherapy   The patient had palonosetron (ALOXI) injection 0.25 mg, 0.25 mg, Intravenous,  Once, 7 of 8 cycles Administration: 0.25 mg (03/14/2018), 0.25 mg (03/27/2018), 0.25 mg (04/18/2018), 0.25 mg (05/02/2018), 0.25 mg (05/16/2018), 0.25 mg (06/05/2018), 0.25 mg (06/27/2018) leucovorin 800 mg in dextrose 5 % 250 mL infusion, 772 mg, Intravenous,  Once, 7 of 8 cycles Administration: 800 mg (03/14/2018), 800 mg (03/27/2018), 800 mg (05/02/2018), 800 mg (05/16/2018), 700 mg (04/18/2018), 800 mg (06/05/2018), 800 mg (06/27/2018) oxaliplatin (ELOXATIN) 165 mg in dextrose 5 % 500 mL chemo infusion, 85 mg/m2 = 165 mg, Intravenous,  Once, 6 of 7 cycles Dose modification: 68 mg/m2 (80 % of original dose 85 mg/m2, Cycle 4, Reason: Provider Judgment) Administration: 165 mg (03/14/2018), 165 mg (03/27/2018), 130 mg (05/02/2018), 130 mg (05/16/2018), 130 mg (06/05/2018), 130 mg (06/27/2018) panitumumab (VECTIBIX) 500 mg in sodium chloride 0.9 % 100 mL chemo infusion, 480 mg, Intravenous,  Once, 4 of 5 cycles Administration: 500 mg (04/18/2018), 480 mg (05/02/2018), 480 mg (05/16/2018), 480 mg (06/05/2018) fluorouracil (ADRUCIL) chemo injection 750 mg, 400 mg/m2 = 750 mg (100 % of original dose 400 mg/m2), Intravenous,  Once, 6 of 7 cycles Dose modification: 400 mg/m2 (original dose 400 mg/m2, Cycle 1), 400 mg/m2 (original dose 400 mg/m2, Cycle 3) Administration: 750 mg (03/14/2018), 750 mg (04/18/2018), 750 mg (05/02/2018), 750 mg (05/16/2018), 750 mg (06/05/2018), 750 mg (06/27/2018) fosaprepitant (EMEND) 150 mg, dexamethasone (DECADRON) 12 mg in sodium chloride 0.9 % 145 mL IVPB, , Intravenous,  Once, 4 of 5 cycles Administration:  (05/02/2018),  (05/16/2018),  (06/05/2018),  (06/27/2018) fluorouracil (ADRUCIL) 4,650 mg in  sodium chloride 0.9 % 57 mL chemo infusion, 2,400 mg/m2 = 4,650 mg, Intravenous, 1 Day/Dose, 7 of 8 cycles Administration: 4,650 mg (03/14/2018), 4,650 mg (03/27/2018), 4,650 mg (04/18/2018), 4,650 mg (05/02/2018), 4,650 mg (05/16/2018), 4,650 mg (06/05/2018), 4,650 mg (06/27/2018)  for chemotherapy treatment.    07/06/2022 -  Chemotherapy   Patient is on Treatment Plan : COLORECTAL FOLFOX + Bevacizumab q14d        INTERVAL HISTORY:   Ermie is a 56 y.o. female presenting to clinic today for follow up of metastatic rectal cancer to liver. She was last seen by me on 07/14/22.  Since her last visit, she completed palliative radiation therapy to the metastatic disease at C6.  Of note, she is scheduled for staging brain MRI on 08/18/22.  Today, she states that she is doing well overall. Her appetite level is at 75%. Her energy level is at 40%.  PAST MEDICAL HISTORY:   Past Medical History: Past Medical History:  Diagnosis Date   Anxiety    Cancer (HCC) 2013   thyroid   Endometrial polyp    Endometrial thickening on ultra sound    GERD (gastroesophageal reflux disease)    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   Worse in her 71s when she was ill   H/O Hashimoto thyroiditis    History of chemotherapy    History of radiation therapy    History of thyroid cancer no recurrence   2013--  s/p  left lobe thyroidectomy--  follicular varient papillary / lymphocyctic thyroiditis  Hyperlipidemia 11/09/2015   Hypothyroidism    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HCC) 02/26/2018    Surgical History: Past Surgical History:  Procedure Laterality Date   BIOPSY  02/19/2018   Procedure: BIOPSY;  Surgeon: Malissa Hippo, MD;  [facility];  Service: Endoscopy;;  rectum   COLONOSCOPY N/A 08/20/2015   Procedure: COLONOSCOPY;  Surgeon: Malissa Hippo, MD;  [facility];  Service: Endoscopy;  Laterality: N/A;  730   COLONOSCOPY WITH PROPOFOL N/A 07/21/2021   Procedure: COLONOSCOPY WITH PROPOFOL;   Surgeon: Malissa Hippo, MD;  [facility];  Service: Endoscopy;  Laterality: N/A;  10:10   D & C HYSTEROOSCOPY W/ THERMACHOICE ENDOMETRIAL ABLATION  08-13-2004   DIVERTING ILEOSTOMY N/A 12/07/2018   Procedure: DIVERTING LOOP ILEOSTOMY;  Surgeon: Romie Levee, MD;  [facility]: [facility];  Service: General;  Laterality: N/A;   FLEXIBLE SIGMOIDOSCOPY N/A 02/19/2018   Procedure: FLEXIBLE SIGMOIDOSCOPY;  Surgeon: Malissa Hippo, MD;  [facility];  Service: Endoscopy;  Laterality: N/A;   HYSTEROSCOPY WITH D & C N/A 04/30/2015   Procedure: DILATATION AND CURETTAGE / INTENDED HYSTEROSCOPY;  Surgeon: Marcelle Overlie, MD;  Location: [facility] ;  Service: Gynecology;  Laterality: N/A;   ILEOSTOMY CLOSURE N/A 04/17/2019   Procedure: LOOP ILEOSTOMY REVERSAL;  Surgeon: Romie Levee, MD;  [facility];  Service: General;  Laterality: N/A;   IR US GUIDE BX ASP/DRAIN  03/09/2018   LAPAROSCOPIC CHOLECYSTECTOMY  04/1996   LAPAROSCOPY N/A 12/11/2018   Procedure: LAPAROSCOPY  ILEOSTOMY REVISION AND ABDOMINAL WASHOUT;  Surgeon: Romie Levee, MD;  [facility];  Service: General;  Laterality: N/A;   Liver Microwave   10/17/2018   POLYPECTOMY  08/20/2015   Procedure: POLYPECTOMY;  Surgeon: Malissa Hippo, MD;  [facility];  Service: Endoscopy;;  Splenic flexure polypectomy   POLYPECTOMY  02/19/2018   Procedure: POLYPECTOMY;  Surgeon: Malissa Hippo, MD;  [facility];  Service: Endoscopy;;  rectum   PORTACATH PLACEMENT N/A 03/14/2018   Procedure: INSERTION PORT-A-CATH;  Surgeon: Franky Macho, MD;  Location: [facility];  Service: General;  Laterality: N/A;   REDUCTION INCARCERATED UTERUS  06-20-2000   intrauterine preg. 13 wks /  urinary retention   THYROID LOBECTOMY  11/24/2011   Procedure: THYROID LOBECTOMY;  Surgeon: Velora Heckler, MD;  [facility];  Service: General;  Laterality: Left;  Left Thyroid Lobectomy   TUBAL LIGATION  2002   XI  ROBOTIC ASSISTED LOWER ANTERIOR RESECTION N/A 12/07/2018   Procedure: XI ROBOTIC ASSISTED LOWER ANTERIOR RESECTION, SPENIC FLEXURE IMMOBILIZATION, COLOANAL ANASTOMOSIS, RIGID PROCTOSCOPY;  Surgeon: Romie Levee, MD;  Location[facility];  Service: General;  Laterality: N/A;    Social History: Social History   Socioeconomic History   Marital status: Married    Spouse name: Not on file   Number of children: 4   Years of education: Not on file   Highest education level: Not on file  Occupational History    Comment: Systems developer at [employer]  Tobacco Use   Smoking status: Former    Packs/day: 0.25    Years: 10.00    Additional pack years: 0.00    Total pack years: 2.50    Types: Cigarettes    Quit date: 10/31/1991    Years since quitting: 30.7   Smokeless tobacco: Never  Vaping Use   Vaping Use: Never used  Substance and Sexual Activity   Alcohol use: No  Drug use: No   Sexual activity: Not Currently  Other Topics Concern   Not on file  Social History Narrative   Lives with husband Loraine Leriche and 74 year old son Almeta Monas   Husband is Technical brewer of [employer]   Social Determinants of Health   Financial Resource Strain: Low Risk  (02/26/2018)   Overall Financial Resource Strain (CARDIA)    Difficulty of Paying Living Expenses: Not hard at all  Food Insecurity: No Food Insecurity (02/26/2018)   Hunger Vital Sign    Worried About Running Out of Food in the Last Year: Never true    Ran Out of Food in the Last Year: Never true  Transportation Needs: No Transportation Needs (02/26/2018)   PRAPARE - Administrator, Civil Service (Medical): No    Lack of Transportation (Non-Medical): No  Physical Activity: Inactive (02/26/2018)   Exercise Vital Sign    Days of Exercise per Week: 0 days    Minutes of Exercise per Session: 0 min  Stress: Stress Concern Present (02/26/2018)   [employer] of Occupational Health - Occupational Stress Questionnaire    Feeling of Stress : To  some extent  Social Connections: Socially Integrated (02/26/2018)   Social Connection and Isolation Panel [NHANES]    Frequency of Communication with Friends and Family: More than three times a week    Frequency of Social Gatherings with Friends and Family: Twice a week    Attends Religious Services: More than 4 times per year    Active Member of [employer] or Organizations: Yes    Attends Engineer, structural: More than 4 times per year    Marital Status: Married  Catering manager Violence: Not At Risk (02/26/2018)   Humiliation, Afraid, Rape, and Kick questionnaire    Fear of Current or Ex-Partner: No    Emotionally Abused: No    Physically Abused: No    Sexually Abused: No    Family History: Family History  Problem Relation Age of Onset   Hypertension Mother    Hypertension Father    Prostate cancer Father    Cancer Maternal Aunt        spine/back   Cancer Paternal Grandfather        lung   Healthy Son    Healthy Son    Healthy Son    Healthy Son    Colon cancer Neg Hx     Current Medications:  Current Outpatient Medications:    ALPRAZolam (XANAX) 0.5 MG tablet, Take 1 tablet (0.5 mg total) by mouth 3 (three) times daily as needed for anxiety., Disp: 90 tablet, Rfl: 5   amitriptyline (ELAVIL) 10 MG tablet, Take 1 tablet (10 mg total) by mouth 2 (two) times daily., Disp: 180 tablet, Rfl: 3   ARMOUR THYROID 60 MG tablet, Take 1 tablet (60 mg total) by mouth daily., Disp: 90 tablet, Rfl: 1   loperamide (IMODIUM) 2 MG capsule, Take 1 capsule (2 mg total) by mouth 3 (three) times daily. (Patient taking differently: Take 2 mg by mouth daily.), Disp: 30 capsule, Rfl: 0   pregabalin (LYRICA) 25 MG capsule, Take 1 capsule (25 mg total) by mouth 2 (two) times daily., Disp: 60 capsule, Rfl: 3   prochlorperazine (COMPAZINE) 10 MG tablet, Take 1 tablet (10 mg total) by mouth every 6 (six) hours as needed for nausea or vomiting., Disp: 30 tablet, Rfl: 4   sucralfate (CARAFATE) 1 g  tablet, Take 1 tablet (1 g total) by  mouth 4 (four) times daily -  with meals and at bedtime. Crush 1 tablet in 1 oz water and drink 5 min before meals for radiation induced esophagitis, Disp: 120 tablet, Rfl: 2   tiZANidine (ZANAFLEX) 4 MG tablet, Take 1 tablet (4 mg total) by mouth every 6 (six) hours as needed for muscle spasms (take for tightness and pain in the scapular region)., Disp: 56 tablet, Rfl: 2   dexamethasone (DECADRON) 2 MG tablet, Take 1 tablet (2 mg total) by mouth 2 (two) times daily., Disp: 60 tablet, Rfl: 1 No current facility-administered medications for this visit.  Facility-Administered Medications Ordered in Other Visits:    clindamycin (CLEOCIN) 900 mg in dextrose 5 % 50 mL IVPB, 900 mg, Intravenous, 60 min Pre-Op **AND** gentamicin (GARAMYCIN) 350 mg in dextrose 5 % 50 mL IVPB, 5 mg/kg, Intravenous, 60 min Pre-Op, Romie Levee, MD   clindamycin (CLEOCIN) 900 mg in dextrose 5 % 50 mL IVPB, 900 mg, Intravenous, 60 min Pre-Op **AND** gentamicin (GARAMYCIN) 5 mg/kg in dextrose 5 % 50 mL IVPB, 5 mg/kg, Intravenous, 60 min Pre-Op, Romie Levee, MD   sodium chloride 0.9 % 1,000 mL with potassium chloride 20 mEq, magnesium sulfate 2 g infusion, , Intravenous, Once, [name], MD   Allergies: Allergies  Allergen Reactions   Demerol [Meperidine] Itching    All over the body.   Penicillins Hives     childhood, does not remember if it spread all over the body or not. Did it involve swelling of the face/tongue/throat, SOB, or low BP? No Did it involve sudden or severe rash/hives, skin peeling, or any reaction on the inside of your mouth or nose? Yes Did you need to seek medical attention at a hospital or doctor's office? Yes When did it last happen?      childhood allergy If all above answers are "NO", may proceed with cephalosporin use.    Levaquin [Levofloxacin] Other (See Comments)    Patient has Aortic Aneurysm and is not indicated with this diagnosis   Other  Hives and Other (See Comments)    Fresh coconut    Vancomycin Rash    Will need Benadryl prior to administration per IV. "Red man syndrome"    REVIEW OF SYSTEMS:   Review of Systems  Constitutional:  Negative for chills, fatigue and fever.  HENT:   Positive for trouble swallowing and voice change. Negative for lump/mass, mouth sores, nosebleeds and sore throat.   Eyes:  Negative for eye problems.  Respiratory:  Negative for cough and shortness of breath.   Cardiovascular:  Negative for chest pain, leg swelling and palpitations.  Gastrointestinal:  Negative for abdominal pain, constipation, diarrhea, nausea and vomiting.  Genitourinary:  Negative for bladder incontinence, difficulty urinating, dysuria, frequency, hematuria and nocturia.   Musculoskeletal:  Negative for arthralgias, back pain, flank pain, myalgias and neck pain.  Skin:  Negative for itching and rash.  Neurological:  Positive for numbness. Negative for dizziness and headaches.  Hematological:  Does not bruise/bleed easily.  Psychiatric/Behavioral:  Negative for depression, sleep disturbance and suicidal ideas. The patient is not nervous/anxious.   All other systems reviewed and are negative.    VITALS:   Last menstrual period 02/16/2015.  Wt Readings from Last 3 Encounters:  08/10/22 135 lb 12.8 oz (61.6 kg)  07/14/22 133 lb 11.2 oz (60.6 kg)  07/07/22 138 lb (62.6 kg)    There is no height or weight on file to calculate BMI.  Performance status (ECOG): 0 -  Asymptomatic  PHYSICAL EXAM:   Physical Exam Vitals and nursing note reviewed. Exam conducted with a chaperone present.  Constitutional:      Appearance: Normal appearance.  Cardiovascular:     Rate and Rhythm: Normal rate and regular rhythm.     Pulses: Normal pulses.     Heart sounds: Normal heart sounds.  Pulmonary:     Effort: Pulmonary effort is normal.     Breath sounds: Normal breath sounds.  Abdominal:     Palpations: Abdomen is soft. There  is no hepatomegaly, splenomegaly or mass.     Tenderness: There is no abdominal tenderness.  Musculoskeletal:     Right lower leg: No edema.     Left lower leg: No edema.  Lymphadenopathy:     Cervical: No cervical adenopathy.     Right cervical: No superficial, deep or posterior cervical adenopathy.    Left cervical: No superficial, deep or posterior cervical adenopathy.     Upper Body:     Right upper body: No supraclavicular or axillary adenopathy.     Left upper body: No supraclavicular or axillary adenopathy.  Neurological:     General: No focal deficit present.     Mental Status: She is alert and oriented to person, place, and time.  Psychiatric:        Mood and Affect: Mood normal.        Behavior: Behavior normal.     LABS:      Latest Ref Rng & Units 08/10/2022    8:11 AM 07/14/2022    8:39 AM 07/06/2022    7:59 AM  CBC  WBC 4.0 - 10.5 K/uL 3.1  5.7  2.5   Hemoglobin 12.0 - 15.0 g/dL 16.1  09.6  04.5   Hematocrit 36.0 - 46.0 % 32.1  32.9  32.1   Platelets 150 - 400 K/uL 100  124  107       Latest Ref Rng & Units 08/10/2022    8:11 AM 07/14/2022    8:39 AM 07/06/2022    7:59 AM  CMP  Glucose 70 - 99 mg/dL 77  98  97   BUN 6 - 20 mg/dL 13  24  18    Creatinine 0.44 - 1.00 mg/dL 4.09  8.11  9.14   Sodium 135 - 145 mmol/L 135  134  136   Potassium 3.5 - 5.1 mmol/L 3.3  3.6  3.4   Chloride 98 - 111 mmol/L 103  102  105   CO2 22 - 32 mmol/L 26  22  24    Calcium 8.9 - 10.3 mg/dL 8.5  8.9  8.6   Total Protein 6.5 - 8.1 g/dL 6.5  7.2  6.7   Total Bilirubin 0.3 - 1.2 mg/dL 3.9  2.5  3.3   Alkaline Phos 38 - 126 U/L 61  115  128   AST 15 - 41 U/L 17  20  20    ALT 0 - 44 U/L 16  19  13       Lab Results  Component Value Date   CEA1 20.1 (H) 05/11/2022   /  CEA  Date Value Ref Range Status  05/11/2022 20.1 (H) 0.0 - 4.7 ng/mL Final    Comment:    (NOTE)                             Nonsmokers          <3.9  Smokers              <5.6 Roche Diagnostics Electrochemiluminescence Immunoassay (ECLIA) Values obtained with different assay methods or kits cannot be used interchangeably.  Results cannot be interpreted as absolute evidence of the presence or absence of malignant disease. Performed At: [facility] [address] [place], [place] [id number] Jolene Schimke MD WU:[id number]    No results found for: "PSA1" No results found for: "CAN199" No results found for: "CAN125"  No results found for: "TOTALPROTELP", "ALBUMINELP", "A1GS", "A2GS", "BETS", "BETA2SER", "GAMS", "MSPIKE", "SPEI" Lab Results  Component Value Date   TIBC 364 05/11/2022   TIBC 385 11/09/2020   FERRITIN 61 05/11/2022   FERRITIN 54 11/09/2020   IRONPCTSAT 26 05/11/2022   IRONPCTSAT 22 11/09/2020   Lab Results  Component Value Date   LDH 154 01/20/2020     STUDIES:   No results found.

## 2022-08-10 ENCOUNTER — Inpatient Hospital Stay: Payer: 59

## 2022-08-10 ENCOUNTER — Encounter: Payer: Self-pay | Admitting: Hematology

## 2022-08-10 ENCOUNTER — Inpatient Hospital Stay (HOSPITAL_BASED_OUTPATIENT_CLINIC_OR_DEPARTMENT_OTHER): Payer: 59 | Admitting: Hematology

## 2022-08-10 ENCOUNTER — Other Ambulatory Visit (HOSPITAL_COMMUNITY): Payer: 59

## 2022-08-10 ENCOUNTER — Inpatient Hospital Stay: Payer: 59 | Attending: Hematology

## 2022-08-10 VITALS — BP 114/72 | HR 74 | Temp 97.9°F | Resp 16

## 2022-08-10 VITALS — BP 120/81 | HR 87 | Temp 98.7°F | Resp 18 | Wt 135.8 lb

## 2022-08-10 DIAGNOSIS — Z7952 Long term (current) use of systemic steroids: Secondary | ICD-10-CM | POA: Insufficient documentation

## 2022-08-10 DIAGNOSIS — Z79631 Long term (current) use of antimetabolite agent: Secondary | ICD-10-CM | POA: Diagnosis not present

## 2022-08-10 DIAGNOSIS — Z8249 Family history of ischemic heart disease and other diseases of the circulatory system: Secondary | ICD-10-CM | POA: Insufficient documentation

## 2022-08-10 DIAGNOSIS — D72819 Decreased white blood cell count, unspecified: Secondary | ICD-10-CM | POA: Insufficient documentation

## 2022-08-10 DIAGNOSIS — C20 Malignant neoplasm of rectum: Secondary | ICD-10-CM | POA: Diagnosis not present

## 2022-08-10 DIAGNOSIS — R161 Splenomegaly, not elsewhere classified: Secondary | ICD-10-CM | POA: Insufficient documentation

## 2022-08-10 DIAGNOSIS — Z79899 Other long term (current) drug therapy: Secondary | ICD-10-CM | POA: Diagnosis not present

## 2022-08-10 DIAGNOSIS — Z923 Personal history of irradiation: Secondary | ICD-10-CM | POA: Insufficient documentation

## 2022-08-10 DIAGNOSIS — Z79890 Hormone replacement therapy (postmenopausal): Secondary | ICD-10-CM | POA: Insufficient documentation

## 2022-08-10 DIAGNOSIS — Z9221 Personal history of antineoplastic chemotherapy: Secondary | ICD-10-CM | POA: Insufficient documentation

## 2022-08-10 DIAGNOSIS — F419 Anxiety disorder, unspecified: Secondary | ICD-10-CM | POA: Diagnosis not present

## 2022-08-10 DIAGNOSIS — Z885 Allergy status to narcotic agent status: Secondary | ICD-10-CM | POA: Diagnosis not present

## 2022-08-10 DIAGNOSIS — Z808 Family history of malignant neoplasm of other organs or systems: Secondary | ICD-10-CM | POA: Insufficient documentation

## 2022-08-10 DIAGNOSIS — Z8042 Family history of malignant neoplasm of prostate: Secondary | ICD-10-CM | POA: Insufficient documentation

## 2022-08-10 DIAGNOSIS — Z87891 Personal history of nicotine dependence: Secondary | ICD-10-CM | POA: Insufficient documentation

## 2022-08-10 DIAGNOSIS — I6782 Cerebral ischemia: Secondary | ICD-10-CM | POA: Insufficient documentation

## 2022-08-10 DIAGNOSIS — Z881 Allergy status to other antibiotic agents status: Secondary | ICD-10-CM | POA: Diagnosis not present

## 2022-08-10 DIAGNOSIS — R9082 White matter disease, unspecified: Secondary | ICD-10-CM | POA: Insufficient documentation

## 2022-08-10 DIAGNOSIS — Z801 Family history of malignant neoplasm of trachea, bronchus and lung: Secondary | ICD-10-CM | POA: Insufficient documentation

## 2022-08-10 DIAGNOSIS — D696 Thrombocytopenia, unspecified: Secondary | ICD-10-CM | POA: Diagnosis not present

## 2022-08-10 DIAGNOSIS — R1310 Dysphagia, unspecified: Secondary | ICD-10-CM | POA: Diagnosis not present

## 2022-08-10 DIAGNOSIS — Z9049 Acquired absence of other specified parts of digestive tract: Secondary | ICD-10-CM | POA: Diagnosis not present

## 2022-08-10 DIAGNOSIS — Z5111 Encounter for antineoplastic chemotherapy: Secondary | ICD-10-CM | POA: Insufficient documentation

## 2022-08-10 DIAGNOSIS — D649 Anemia, unspecified: Secondary | ICD-10-CM | POA: Diagnosis not present

## 2022-08-10 DIAGNOSIS — Z95828 Presence of other vascular implants and grafts: Secondary | ICD-10-CM

## 2022-08-10 DIAGNOSIS — C7931 Secondary malignant neoplasm of brain: Secondary | ICD-10-CM | POA: Diagnosis not present

## 2022-08-10 DIAGNOSIS — C7951 Secondary malignant neoplasm of bone: Secondary | ICD-10-CM | POA: Insufficient documentation

## 2022-08-10 DIAGNOSIS — C787 Secondary malignant neoplasm of liver and intrahepatic bile duct: Secondary | ICD-10-CM | POA: Insufficient documentation

## 2022-08-10 LAB — COMPREHENSIVE METABOLIC PANEL
ALT: 16 U/L (ref 0–44)
AST: 17 U/L (ref 15–41)
Albumin: 3.5 g/dL (ref 3.5–5.0)
Alkaline Phosphatase: 61 U/L (ref 38–126)
Anion gap: 6 (ref 5–15)
BUN: 13 mg/dL (ref 6–20)
CO2: 26 mmol/L (ref 22–32)
Calcium: 8.5 mg/dL — ABNORMAL LOW (ref 8.9–10.3)
Chloride: 103 mmol/L (ref 98–111)
Creatinine, Ser: 0.7 mg/dL (ref 0.44–1.00)
GFR, Estimated: >60 mL/min (ref >60)
Glucose, Bld: 77 mg/dL (ref 70–99)
Potassium: 3.3 mmol/L — ABNORMAL LOW (ref 3.5–5.1)
Sodium: 135 mmol/L (ref 135–145)
Total Bilirubin: 3.9 mg/dL — ABNORMAL HIGH (ref 0.3–1.2)
Total Protein: 6.5 g/dL (ref 6.5–8.1)

## 2022-08-10 LAB — CBC WITH DIFFERENTIAL/PLATELET
Abs Immature Granulocytes: 0.03 10*3/uL (ref 0.00–0.07)
Basophils Absolute: 0 10*3/uL (ref 0.0–0.1)
Basophils Relative: 0 %
Eosinophils Absolute: 0 10*3/uL (ref 0.0–0.5)
Eosinophils Relative: 1 %
HCT: 32.1 % — ABNORMAL LOW (ref 36.0–46.0)
Hemoglobin: 11.3 g/dL — ABNORMAL LOW (ref 12.0–15.0)
Immature Granulocytes: 1 %
Lymphocytes Relative: 21 %
Lymphs Abs: 0.7 10*3/uL (ref 0.7–4.0)
MCH: 35.8 pg — ABNORMAL HIGH (ref 26.0–34.0)
MCHC: 35.2 g/dL (ref 30.0–36.0)
MCV: 101.6 fL — ABNORMAL HIGH (ref 80.0–100.0)
Monocytes Absolute: 0.2 10*3/uL (ref 0.1–1.0)
Monocytes Relative: 7 %
Neutro Abs: 2.2 10*3/uL (ref 1.7–7.7)
Neutrophils Relative %: 70 %
Platelets: 100 10*3/uL — ABNORMAL LOW (ref 150–400)
RBC: 3.16 MIL/uL — ABNORMAL LOW (ref 3.87–5.11)
RDW: 16 % — ABNORMAL HIGH (ref 11.5–15.5)
WBC: 3.1 10*3/uL — ABNORMAL LOW (ref 4.0–10.5)
nRBC: 0 % (ref 0.0–0.2)

## 2022-08-10 LAB — MAGNESIUM: Magnesium: 2.1 mg/dL (ref 1.7–2.4)

## 2022-08-10 MED ORDER — SODIUM CHLORIDE 0.9 % IV SOLN
5.0000 mg/kg | Freq: Once | INTRAVENOUS | Status: AC
  Administered 2022-08-10: 300 mg via INTRAVENOUS
  Filled 2022-08-10: qty 12

## 2022-08-10 MED ORDER — SODIUM CHLORIDE 0.9 % IV SOLN
10.0000 mg | Freq: Once | INTRAVENOUS | Status: AC
  Administered 2022-08-10: 10 mg via INTRAVENOUS
  Filled 2022-08-10: qty 10

## 2022-08-10 MED ORDER — HEPARIN SOD (PORK) LOCK FLUSH 100 UNIT/ML IV SOLN: 500.0000 [IU] | Freq: Once | INTRAVENOUS | Status: DC

## 2022-08-10 MED ORDER — SODIUM CHLORIDE 0.9% FLUSH
10.0000 mL | INTRAVENOUS | Status: DC | PRN
  Administered 2022-08-10: 10 mL via INTRAVENOUS

## 2022-08-10 MED ORDER — SODIUM CHLORIDE 0.9 % IV SOLN
320.0000 mg/m2 | Freq: Once | INTRAVENOUS | Status: AC
  Administered 2022-08-10: 540 mg via INTRAVENOUS
  Filled 2022-08-10: qty 27

## 2022-08-10 MED ORDER — PALONOSETRON HCL INJECTION 0.25 MG/5ML
0.2500 mg | Freq: Once | INTRAVENOUS | Status: AC
  Administered 2022-08-10: 0.25 mg via INTRAVENOUS
  Filled 2022-08-10: qty 5

## 2022-08-10 MED ORDER — SODIUM CHLORIDE 0.9 % IV SOLN: Freq: Once | INTRAVENOUS | Status: AC

## 2022-08-10 MED ORDER — SODIUM CHLORIDE 0.9 % IV SOLN
1920.0000 mg/m2 | INTRAVENOUS | Status: DC
  Administered 2022-08-10: 3500 mg via INTRAVENOUS
  Filled 2022-08-10: qty 70

## 2022-08-10 MED ORDER — FLUOROURACIL CHEMO INJECTION 500 MG/10ML
320.0000 mg/m2 | Freq: Once | INTRAVENOUS | Status: AC
  Administered 2022-08-10: 500 mg via INTRAVENOUS
  Filled 2022-08-10: qty 10

## 2022-08-10 NOTE — Progress Notes (Signed)
Labs reviewed today with MD at office visit. Will hold the oxaliplatin today per MD. Will give other treatment per MD.   Treatment given per orders. Patient tolerated it well without problems. Vitals stable and discharged home from clinic ambulatory. Follow up as scheduled.

## 2022-08-10 NOTE — Patient Instructions (Signed)
[facility]  Discharge Instructions: Thank you for choosing [facility] to provide your oncology and hematology care.  If you have a lab appointment with the Cancer Center - please note that after April 8th, 2024, all labs will be drawn in the cancer center.  You do not have to check in or register with the main entrance as you have in the past but will complete your check-in in the cancer center.  Wear comfortable clothing and clothing appropriate for easy access to any Portacath or PICC line.   We strive to give you quality time with your provider. You may need to reschedule your appointment if you arrive late (15 or more minutes).  Arriving late affects you and other patients whose appointments are after yours.  Also, if you miss three or more appointments without notifying the office, you may be dismissed from the clinic at the provider's discretion.      For prescription refill requests, have your pharmacy contact our office and allow 72 hours for refills to be completed.    Today you received the following chemotherapy and/or immunotherapy agents leucovorin, avastin   To help prevent nausea and vomiting after your treatment, we encourage you to take your nausea medication as directed.  BELOW ARE SYMPTOMS THAT SHOULD BE REPORTED IMMEDIATELY: *FEVER GREATER THAN 100.4 F (38 C) OR HIGHER *CHILLS OR SWEATING *NAUSEA AND VOMITING THAT IS NOT CONTROLLED WITH YOUR NAUSEA MEDICATION *UNUSUAL SHORTNESS OF BREATH *UNUSUAL BRUISING OR BLEEDING *URINARY PROBLEMS (pain or burning when urinating, or frequent urination) *BOWEL PROBLEMS (unusual diarrhea, constipation, pain near the anus) TENDERNESS IN MOUTH AND THROAT WITH OR WITHOUT PRESENCE OF ULCERS (sore throat, sores in mouth, or a toothache) UNUSUAL RASH, SWELLING OR PAIN  UNUSUAL VAGINAL DISCHARGE OR ITCHING   Items with * indicate a potential emergency and should be followed up as soon as possible or go  to the Emergency Department if any problems should occur.  Please show the CHEMOTHERAPY ALERT CARD or IMMUNOTHERAPY ALERT CARD at check-in to the Emergency Department and triage nurse.  Should you have questions after your visit or need to cancel or reschedule your appointment, please contact [facility] [phone]  and follow the prompts.  Office hours are 8:00 a.m. to 4:30 p.m. Monday - Friday. Please note that voicemails left after 4:00 p.m. may not be returned until the following business day.  We are closed weekends and major holidays. You have access to a nurse at all times for urgent questions. Please call the main number to the clinic [phone] and follow the prompts.  For any non-urgent questions, you may also contact your provider using MyChart. We now offer e-Visits for anyone 75 and older to request care online for non-urgent symptoms. For details visit [email or web address].   Also download the MyChart app! Go to the app store, search "MyChart", open the app, select [place], and log in with your MyChart username and password.

## 2022-08-10 NOTE — Progress Notes (Signed)
Patient has been examined by Dr. Ellin Saba. Vital signs and labs have been reviewed by MD - ANC, Creatinine, LFTs (bilirubin 3.9), hemoglobin, and platelets are within treatment parameters per M.D. - pt may proceed with treatment. Holding oxaliplatin today per MD. Primary RN and pharmacy notified.

## 2022-08-10 NOTE — Progress Notes (Signed)
Patients port flushed without difficulty.  Good blood return noted with no bruising or swelling noted at site.  Patient remains accessed for chemotherapy treatment.  

## 2022-08-10 NOTE — Patient Instructions (Signed)
[facility] at [facility] Discharge Instructions   You were seen and examined today by Dr. Ellin Saba.  He reviewed the results of your lab work which are mostly normal/stable. Your bilirubin is 3.9 today. Your potassium is slightly low at 3.3.   We will proceed with your treatment today.   Return as scheduled.    Thank you for choosing [facility] at [facility] to provide your oncology and hematology care.  To afford each patient quality time with our provider, please arrive at least 15 minutes before your scheduled appointment time.   If you have a lab appointment with the Cancer Center please come in thru the Main Entrance and check in at the main information desk.  You need to re-schedule your appointment should you arrive 10 or more minutes late.  We strive to give you quality time with our providers, and arriving late affects you and other patients whose appointments are after yours.  Also, if you no show three or more times for appointments you may be dismissed from the clinic at the providers discretion.     Again, thank you for choosing [facility].  Our hope is that these requests will decrease the amount of time that you wait before being seen by our physicians.       _____________________________________________________________  Should you have questions after your visit to [facility], please contact our office at [phone] and follow the prompts.  Our office hours are 8:00 a.m. and 4:30 p.m. Monday - Friday.  Please note that voicemails left after 4:00 p.m. may not be returned until the following business day.  We are closed weekends and major holidays.  You do have access to a nurse 24-7, just call the main number to the clinic [phone] and do not press any options, hold on the line and a nurse will answer the phone.    For prescription refill requests, have your pharmacy contact our office and  allow 72 hours.    Due to Covid, you will need to wear a mask upon entering the hospital. If you do not have a mask, a mask will be given to you at the Main Entrance upon arrival. For doctor visits, patients may have 1 support person age 37 or older with them. For treatment visits, patients can not have anyone with them due to social distancing guidelines and our immunocompromised population.

## 2022-08-10 NOTE — Progress Notes (Signed)
Continue with premedication for chemotherapy today despite holding oxaliplatin.  T.O. Dr Carilyn Goodpasture, PharmD

## 2022-08-12 ENCOUNTER — Inpatient Hospital Stay: Payer: 59

## 2022-08-12 VITALS — BP 111/67 | HR 74 | Temp 98.1°F | Resp 16

## 2022-08-12 DIAGNOSIS — C20 Malignant neoplasm of rectum: Secondary | ICD-10-CM

## 2022-08-12 DIAGNOSIS — Z5111 Encounter for antineoplastic chemotherapy: Secondary | ICD-10-CM | POA: Diagnosis not present

## 2022-08-12 MED ORDER — SODIUM CHLORIDE 0.9% FLUSH
10.0000 mL | INTRAVENOUS | Status: DC | PRN
  Administered 2022-08-12: 10 mL

## 2022-08-12 MED ORDER — HEPARIN SOD (PORK) LOCK FLUSH 100 UNIT/ML IV SOLN
500.0000 [IU] | Freq: Once | INTRAVENOUS | Status: AC | PRN
  Administered 2022-08-12: 500 [IU]

## 2022-08-12 NOTE — Progress Notes (Signed)
Samantha Reynolds presents to have home infusion pump d/c'd and for port-a-cath deaccess with flush.  Portacath located left chest wall accessed with  H 20 needle.  Good blood return present. Portacath flushed with NS and 500U/43ml Heparin, and needle removed intact.  Procedure tolerated well and without incident.  Vitals stable and discharged home from clinic ambulatory. Follow up as scheduled.

## 2022-08-12 NOTE — Patient Instructions (Signed)
[facility] AT [facility]  Discharge Instructions: Thank you for choosing [facility] to provide your oncology and hematology care.  If you have a lab appointment with the Cancer Center - please note that after April 8th, 2024, all labs will be drawn in the cancer center.  You do not have to check in or register with the main entrance as you have in the past but will complete your check-in in the cancer center.  Wear comfortable clothing and clothing appropriate for easy access to any Portacath or PICC line.   We strive to give you quality time with your provider. You may need to reschedule your appointment if you arrive late (15 or more minutes).  Arriving late affects you and other patients whose appointments are after yours.  Also, if you miss three or more appointments without notifying the office, you may be dismissed from the clinic at the provider's discretion.      For prescription refill requests, have your pharmacy contact our office and allow 72 hours for refills to be completed.    Today you had your amublatory pump disconnected      To help prevent nausea and vomiting after your treatment, we encourage you to take your nausea medication as directed.  BELOW ARE SYMPTOMS THAT SHOULD BE REPORTED IMMEDIATELY: *FEVER GREATER THAN 100.4 F (38 C) OR HIGHER *CHILLS OR SWEATING *NAUSEA AND VOMITING THAT IS NOT CONTROLLED WITH YOUR NAUSEA MEDICATION *UNUSUAL SHORTNESS OF BREATH *UNUSUAL BRUISING OR BLEEDING *URINARY PROBLEMS (pain or burning when urinating, or frequent urination) *BOWEL PROBLEMS (unusual diarrhea, constipation, pain near the anus) TENDERNESS IN MOUTH AND THROAT WITH OR WITHOUT PRESENCE OF ULCERS (sore throat, sores in mouth, or a toothache) UNUSUAL RASH, SWELLING OR PAIN  UNUSUAL VAGINAL DISCHARGE OR ITCHING   Items with * indicate a potential emergency and should be followed up as soon as possible or go to the Emergency Department if any problems  should occur.  Please show the CHEMOTHERAPY ALERT CARD or IMMUNOTHERAPY ALERT CARD at check-in to the Emergency Department and triage nurse.  Should you have questions after your visit or need to cancel or reschedule your appointment, please contact [facility] AT [facility] [phone]  and follow the prompts.  Office hours are 8:00 a.m. to 4:30 p.m. Monday - Friday. Please note that voicemails left after 4:00 p.m. may not be returned until the following business day.  We are closed weekends and major holidays. You have access to a nurse at all times for urgent questions. Please call the main number to the clinic [phone] and follow the prompts.  For any non-urgent questions, you may also contact your provider using MyChart. We now offer e-Visits for anyone 45 and older to request care online for non-urgent symptoms. For details visit [email or web address].   Also download the MyChart app! Go to the app store, search "MyChart", open the app, select [place], and log in with your MyChart username and password.

## 2022-08-15 ENCOUNTER — Other Ambulatory Visit: Payer: Self-pay

## 2022-08-17 ENCOUNTER — Ambulatory Visit: Payer: 59 | Admitting: Hematology

## 2022-08-17 ENCOUNTER — Ambulatory Visit: Payer: 59

## 2022-08-17 ENCOUNTER — Other Ambulatory Visit: Payer: 59

## 2022-08-18 ENCOUNTER — Ambulatory Visit (HOSPITAL_COMMUNITY)
Admission: RE | Admit: 2022-08-18 | Discharge: 2022-08-18 | Disposition: A | Discharge status: Home / Self Care | Payer: 59 | Source: Ambulatory Visit | Attending: Radiation Oncology | Admitting: Radiation Oncology

## 2022-08-18 DIAGNOSIS — C20 Malignant neoplasm of rectum: Secondary | ICD-10-CM | POA: Diagnosis not present

## 2022-08-18 DIAGNOSIS — G9389 Other specified disorders of brain: Secondary | ICD-10-CM | POA: Diagnosis not present

## 2022-08-18 DIAGNOSIS — C7951 Secondary malignant neoplasm of bone: Secondary | ICD-10-CM | POA: Diagnosis not present

## 2022-08-18 DIAGNOSIS — R200 Anesthesia of skin: Secondary | ICD-10-CM | POA: Diagnosis not present

## 2022-08-18 MED ORDER — GADOBUTROL 1 MMOL/ML IV SOLN
6.0000 mL | Freq: Once | INTRAVENOUS | Status: AC | PRN
  Administered 2022-08-18: 6 mL via INTRAVENOUS

## 2022-08-23 ENCOUNTER — Other Ambulatory Visit (HOSPITAL_COMMUNITY): Payer: Self-pay

## 2022-08-23 ENCOUNTER — Telehealth: Payer: Self-pay | Admitting: Radiation Oncology

## 2022-08-23 ENCOUNTER — Telehealth: Payer: Self-pay | Admitting: Radiation Therapy

## 2022-08-23 MED ORDER — ARMOUR THYROID 60 MG PO TABS
60.0000 mg | ORAL_TABLET | Freq: Every day | ORAL | 1 refills | Status: DC
  Filled 2022-08-23: qty 90, 90d supply, fill #0

## 2022-08-23 NOTE — Telephone Encounter (Signed)
I spoke with the patient today to review her MRI results and the recommendations from Dr. Mitzi Hansen to proceed with stereotactic radiosurgery.  She is not established with neuro surgery and we will coordinate with their clinic.  Her MRI was a 3 Tesla scans that this will be used to plan her therapy.  We discussed the need for simulation with IV contrast and to make a mask for her to wear for immobilization purposes.  This brought up concerns about claustrophobia when she went for her MRI scan.  She has Ativan at home and was instructed to try and take a whole tablet instead of half which she has previously used.  Her husband will drive her for her appointments.  She is aware of the side effect profile of this medication.  She also reports that the facial numbness she had been experiencing comes and goes somewhat and is less intense than it had been previously.  She is now tapered off of steroids and has not been on them for about a week's time.  She states that her skin of the upper neck has improved but did have delayed symptoms of redness and what sounds to be some blistering posteriorly.  She states this is almost completely gone. She has also had esophagitis which has improved, but developed hoarseness of her voice prior to radiation and with her symptoms, her Oxaliplatin was healed during her last cycle of chemo. She does feel like this worsened again by radiation. After reviewing and discussing, I told her it would be unusual for hoarseness to be caused by radiation, but that if her symptoms haven't improved in the next week, she might be a good candidate for an opinion with ENT. She is in agreement with this plan.

## 2022-08-23 NOTE — Progress Notes (Signed)
[facility] [address] [place] [address]    Clinic Day:  08/24/2022  Referring physician: Babs Sciara, MD  Patient Care Team: Babs Sciara, MD as PCP - General (Family Medicine) [name], MD as Medical Oncologist (Medical Oncology)   ASSESSMENT & PLAN:   Assessment: 1.  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  Liver biopsy on 03/09/2018 consistent with adenocarcinoma. 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 -7 cycles of FOLFOX with vectibix completed on 06/27/2018. -XRT with Xeloda from 07/30/2018 through 09/06/2018. -Right liver lesion microwave ablation on 10/15/2018 at [facility]. -Low anterior resection and diverting ileostomy on 12/07/2018, pathology [id number], 0/6 lymph nodes positive, margins negative. -Loop ileostomy reversal on 04/17/2019. - Microwave ablation of the right hepatic lobe lesion by Dr. Selena Batten around 08/15/2019. -SBRT to the left lower lobe lung lesion and left liver lobe lesion on 12/13/2019 at [facility]. - Right upper lobe lesion SBRT from 08/06/2020 through 08/13/2020. - 3 left lung lesions and subcarinal lymph node IMRT from 09/21/2021 through 10/05/2021. - Liver biopsy (06/09/2022): Metastatic moderately differentiated colonic adenocarcinoma. - NGS: KRAS/NRAS/BRAF wild-type, MSI-stable, HER2-0, negative for other targetable mutations - Cycle 1 of FOLFOX and bevacizumab on 07/06/2022 - XRT to the right neck from 07/14/2022 through 08/03/2022    Plan: 1.  Stage IV rectal adenocarcinoma, BRAF/RAS wild-type, MSI-stable: - She received last cycle with 5-FU and bevacizumab. - She reported hoarseness was for 4 to 5 days after cycle 2.  This has gradually improved.  She also reported dry mouth and corners of the mouth being raw for few days. - She has dysphagia more to breads and creamed potatoes.  This has also improved in the last 4 to 5 days. - I  have recommended ENT evaluation for hoarseness.  She wants to hold off on it. - Reviewed labs today which showed mild leukopenia and thrombocytopenia.  LFTs are normal except bilirubin high at 5.4.  She is going through lot of stress at this time. - I have recommended that we hold off on oxaliplatin today and proceed with 5-FU and bevacizumab.  She will have SRS next Wednesday.  RTC 2 weeks for follow-up. - She is planning on beach vacation from 10/15/2022 through 10/22/2022.  We will have to work her chemotherapy around it.   2.  Hyperbilirubinemia: - She has Gilbert's syndrome.  Total bilirubin today is 5.4.   3.  Normocytic anemia: - Hemoglobin is 10.6.  Will monitor ferritin and iron panel intermittently.   4.  Leukopenia and thrombocytopenia: - Intermittent leukopenia and thrombocytopenia exacerbated by splenomegaly.   5.  Brain metastasis: - MRI of the brain on 08/18/2022: 3 mm nodular enhancing lesion in the anterior right frontal lobe. - She is being planned for [facility].    No orders of the defined types were placed in this encounter.     I,Samantha Reynolds,acting as a Neurosurgeon for [name], MD.,have documented all relevant documentation on the behalf of [name], MD,as directed by  [name], MD while in the presence of [name], MD.   I, [name] MD, have reviewed the above documentation for accuracy and completeness, and I agree with the above.   [name], MD   5/29/20246:18 PM  CHIEF COMPLAINT:   Diagnosis: metastatic rectal cancer to liver    Cancer Staging  Malignant neoplasm of rectum [facility]) Staging form: Colon and Rectum, AJCC 8th Edition - Clinical stage  from 03/13/2018: Stage IVA (cT3, cN1b, cM1a) - Signed by [name], MD on 03/13/2018    Prior Therapy: 1. FOLFOX & vectibix x 7 cycles from 03/14/2018 to 06/27/2018. 2. XRT with Xeloda from 07/30/2018 to 09/06/2018. 3. Right liver lesion  microwave ablation on 10/15/2018. 4. Low anterior resection and diverting ileostomy on 12/07/2018. 5. Right hepatic lobe microwave ablation on 08/15/19 6. SBRT to LLL and liver 11/15/2019 -12/13/2019. 7. SBRT to RUL 08/06/20 - 08/13/20 8. IMRT to 3 left lung lesions and subcarinal LN 09/21/21 - 10/05/21 9. XRT to C6 07/14/22 - 08/03/22  Current Therapy:  FOLFOX and bevacizumab    HISTORY OF PRESENT ILLNESS:   Oncology History  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HCC)  02/26/2018 Initial Diagnosis   Rectal cancer (HCC)   03/13/2018 Cancer Staging   Staging form: Colon and Rectum, AJCC 8th Edition - Clinical stage from 03/13/2018: Stage IVA (cT3, cN1b, cM1a) - Signed by [name], MD on 03/13/2018   03/14/2018 - 06/29/2018 Chemotherapy   The patient had palonosetron (ALOXI) injection 0.25 mg, 0.25 mg, Intravenous,  Once, 7 of 8 cycles Administration: 0.25 mg (03/14/2018), 0.25 mg (03/27/2018), 0.25 mg (04/18/2018), 0.25 mg (05/02/2018), 0.25 mg (05/16/2018), 0.25 mg (06/05/2018), 0.25 mg (06/27/2018) leucovorin 800 mg in dextrose 5 % 250 mL infusion, 772 mg, Intravenous,  Once, 7 of 8 cycles Administration: 800 mg (03/14/2018), 800 mg (03/27/2018), 800 mg (05/02/2018), 800 mg (05/16/2018), 700 mg (04/18/2018), 800 mg (06/05/2018), 800 mg (06/27/2018) oxaliplatin (ELOXATIN) 165 mg in dextrose 5 % 500 mL chemo infusion, 85 mg/m2 = 165 mg, Intravenous,  Once, 6 of 7 cycles Dose modification: 68 mg/m2 (80 % of original dose 85 mg/m2, Cycle 4, Reason: Provider Judgment) Administration: 165 mg (03/14/2018), 165 mg (03/27/2018), 130 mg (05/02/2018), 130 mg (05/16/2018), 130 mg (06/05/2018), 130 mg (06/27/2018) panitumumab (VECTIBIX) 500 mg in sodium chloride 0.9 % 100 mL chemo infusion, 480 mg, Intravenous,  Once, 4 of 5 cycles Administration: 500 mg (04/18/2018), 480 mg (05/02/2018), 480 mg (05/16/2018), 480 mg (06/05/2018) fluorouracil (ADRUCIL) chemo injection 750 mg, 400 mg/m2 = 750 mg (100 % of original dose 400  mg/m2), Intravenous,  Once, 6 of 7 cycles Dose modification: 400 mg/m2 (original dose 400 mg/m2, Cycle 1), 400 mg/m2 (original dose 400 mg/m2, Cycle 3) Administration: 750 mg (03/14/2018), 750 mg (04/18/2018), 750 mg (05/02/2018), 750 mg (05/16/2018), 750 mg (06/05/2018), 750 mg (06/27/2018) fosaprepitant (EMEND) 150 mg, dexamethasone (DECADRON) 12 mg in sodium chloride 0.9 % 145 mL IVPB, , Intravenous,  Once, 4 of 5 cycles Administration:  (05/02/2018),  (05/16/2018),  (06/05/2018),  (06/27/2018) fluorouracil (ADRUCIL) 4,650 mg in sodium chloride 0.9 % 57 mL chemo infusion, 2,400 mg/m2 = 4,650 mg, Intravenous, 1 Day/Dose, 7 of 8 cycles Administration: 4,650 mg (03/14/2018), 4,650 mg (03/27/2018), 4,650 mg (04/18/2018), 4,650 mg (05/02/2018), 4,650 mg (05/16/2018), 4,650 mg (06/05/2018), 4,650 mg (06/27/2018)  for chemotherapy treatment.    07/06/2022 -  Chemotherapy   Patient is on Treatment Plan : COLORECTAL FOLFOX + Bevacizumab q14d        INTERVAL HISTORY:   Samantha Reynolds is a 57 y.o. female presenting to clinic today for follow up of metastatic rectal cancer to liver. She was last seen by me on 08/10/22.  Since her last visit, she underwent brain MRI on 08/18/22 showing: 3 mm nodular enhancing lesion within anterior right frontal lobe white matter; 4 mm enhancing focus within left frontal calvarium, nonspecific but osseous metastasis not excluded.  Today, she states that she is doing well  overall. Her appetite level is at 70%. Her energy level is at 60%.  PAST MEDICAL HISTORY:   Past Medical History: Past Medical History:  Diagnosis Date   Anxiety    Cancer (HCC) 2013   thyroid   Endometrial polyp    Endometrial thickening on ultra sound    GERD (gastroesophageal reflux disease)    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   Worse in her 86s when she was ill   H/O Hashimoto thyroiditis    History of chemotherapy    History of radiation therapy    History of thyroid cancer no recurrence   2013--  s/p  left lobe  thyroidectomy--  follicular varient papillary / lymphocyctic thyroiditis   Hyperlipidemia 11/09/2015   Hypothyroidism    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HCC) 02/26/2018    Surgical History: Past Surgical History:  Procedure Laterality Date   BIOPSY  02/19/2018   Procedure: BIOPSY;  Surgeon: Malissa Hippo, MD;  [facility];  Service: Endoscopy;;  rectum   COLONOSCOPY N/A 08/20/2015   Procedure: COLONOSCOPY;  Surgeon: Malissa Hippo, MD;  [facility];  Service: Endoscopy;  Laterality: N/A;  730   COLONOSCOPY WITH PROPOFOL N/A 07/21/2021   Procedure: COLONOSCOPY WITH PROPOFOL;  Surgeon: Malissa Hippo, MD;  [facility];  Service: Endoscopy;  Laterality: N/A;  10:10   D & C HYSTEROOSCOPY W/ THERMACHOICE ENDOMETRIAL ABLATION  08-13-2004   DIVERTING ILEOSTOMY N/A 12/07/2018   Procedure: DIVERTING LOOP ILEOSTOMY;  Surgeon: Romie Levee, MD;  Location: [facility];  Service: General;  Laterality: N/A;   FLEXIBLE SIGMOIDOSCOPY N/A 02/19/2018   Procedure: FLEXIBLE SIGMOIDOSCOPY;  Surgeon: Malissa Hippo, MD;  [facility];  Service: Endoscopy;  Laterality: N/A;   HYSTEROSCOPY WITH D & C N/A 04/30/2015   Procedure: DILATATION AND CURETTAGE / INTENDED HYSTEROSCOPY;  Surgeon: Marcelle Overlie, MD;  Location[facility];  Service: Gynecology;  Laterality: N/A;   ILEOSTOMY CLOSURE N/A 04/17/2019   Procedure: LOOP ILEOSTOMY REVERSAL;  Surgeon: Romie Levee, MD;  Location: [facility];  Service: General;  Laterality: N/A;   IR US GUIDE BX ASP/DRAIN  03/09/2018   LAPAROSCOPIC CHOLECYSTECTOMY  04/1996   LAPAROSCOPY N/A 12/11/2018   Procedure: LAPAROSCOPY  ILEOSTOMY REVISION AND ABDOMINAL WASHOUT;  Surgeon: Romie Levee, MD;  Location: [facility];  Service: General;  Laterality: N/A;   Liver Microwave   10/17/2018   POLYPECTOMY  08/20/2015   Procedure: POLYPECTOMY;  Surgeon: Malissa Hippo, MD;  [facility];  Service: Endoscopy;;  Splenic  flexure polypectomy   POLYPECTOMY  02/19/2018   Procedure: POLYPECTOMY;  Surgeon: Malissa Hippo, MD;  [facility];  Service: Endoscopy;;  rectum   PORTACATH PLACEMENT N/A 03/14/2018   Procedure: INSERTION PORT-A-CATH;  Surgeon: Franky Macho, MD;  Location: [facility];  Service: General;  Laterality: N/A;   REDUCTION INCARCERATED UTERUS  06-20-2000   intrauterine preg. 13 wks /  urinary retention   THYROID LOBECTOMY  11/24/2011   Procedure: THYROID LOBECTOMY;  Surgeon: Velora Heckler, MD;  Location: [facility];  Service: General;  Laterality: Left;  Left Thyroid Lobectomy   TUBAL LIGATION  2002   XI ROBOTIC ASSISTED LOWER ANTERIOR RESECTION N/A 12/07/2018   Procedure: XI ROBOTIC ASSISTED LOWER ANTERIOR RESECTION, SPENIC FLEXURE IMMOBILIZATION, COLOANAL ANASTOMOSIS, RIGID PROCTOSCOPY;  Surgeon: Romie Levee, MD;  Location: [facility];  Service: General;  Laterality: N/A;    Social History: Social History   Socioeconomic History  Marital status: Married    Spouse name: Not on file   Number of children: 4   Years of education: Not on file   Highest education level: Not on file  Occupational History    Comment: Systems developer at [employer]  Tobacco Use   Smoking status: Former    Packs/day: 0.25    Years: 10.00    Additional pack years: 0.00    Total pack years: 2.50    Types: Cigarettes    Quit date: 10/31/1991    Years since quitting: 30.8   Smokeless tobacco: Never  Vaping Use   Vaping Use: Never used  Substance and Sexual Activity   Alcohol use: No   Drug use: No   Sexual activity: Not Currently  Other Topics Concern   Not on file  Social History Narrative   Lives with husband Loraine Leriche and 23 year old son Almeta Monas   Husband is Technical brewer of [employer]   Social Determinants of Health   Financial Resource Strain: Low Risk  (02/26/2018)   Overall Financial Resource Strain (CARDIA)    Difficulty of Paying Living Expenses: Not hard at all  Food Insecurity: No Food Insecurity  (02/26/2018)   Hunger Vital Sign    Worried About Running Out of Food in the Last Year: Never true    Ran Out of Food in the Last Year: Never true  Transportation Needs: No Transportation Needs (02/26/2018)   PRAPARE - Administrator, Civil Service (Medical): No    Lack of Transportation (Non-Medical): No  Physical Activity: Inactive (02/26/2018)   Exercise Vital Sign    Days of Exercise per Week: 0 days    Minutes of Exercise per Session: 0 min  Stress: Stress Concern Present (02/26/2018)   [employer] of Occupational Health - Occupational Stress Questionnaire    Feeling of Stress : To some extent  Social Connections: Socially Integrated (02/26/2018)   Social Connection and Isolation Panel [NHANES]    Frequency of Communication with Friends and Family: More than three times a week    Frequency of Social Gatherings with Friends and Family: Twice a week    Attends Religious Services: More than 4 times per year    Active Member of [employer] or Organizations: Yes    Attends Engineer, structural: More than 4 times per year    Marital Status: Married  Catering manager Violence: Not At Risk (02/26/2018)   Humiliation, Afraid, Rape, and Kick questionnaire    Fear of Current or Ex-Partner: No    Emotionally Abused: No    Physically Abused: No    Sexually Abused: No    Family History: Family History  Problem Relation Age of Onset   Hypertension Mother    Hypertension Father    Prostate cancer Father    Cancer Maternal Aunt        spine/back   Cancer Paternal Grandfather        lung   Healthy Son    Healthy Son    Healthy Son    Healthy Son    Colon cancer Neg Hx     Current Medications:  Current Outpatient Medications:    ALPRAZolam (XANAX) 0.5 MG tablet, Take 1 tablet (0.5 mg total) by mouth 3 (three) times daily as needed for anxiety., Disp: 90 tablet, Rfl: 5   amitriptyline (ELAVIL) 10 MG tablet, Take 1 tablet (10 mg total) by mouth 2 (two) times daily.,  Disp: 180 tablet, Rfl: 3  ARMOUR THYROID 60 MG tablet, Take 1 tablet (60 mg total) by mouth daily., Disp: 90 tablet, Rfl: 1   ARMOUR THYROID 60 MG tablet, Take 1 tablet (60 mg total) by mouth daily., Disp: 90 tablet, Rfl: 1   dexamethasone (DECADRON) 2 MG tablet, Take 1 tablet (2 mg total) by mouth 2 (two) times daily., Disp: 60 tablet, Rfl: 1   loperamide (IMODIUM) 2 MG capsule, Take 1 capsule (2 mg total) by mouth 3 (three) times daily. (Patient taking differently: Take 2 mg by mouth daily.), Disp: 30 capsule, Rfl: 0   pregabalin (LYRICA) 25 MG capsule, Take 1 capsule (25 mg total) by mouth 2 (two) times daily., Disp: 60 capsule, Rfl: 3   prochlorperazine (COMPAZINE) 10 MG tablet, Take 1 tablet (10 mg total) by mouth every 6 (six) hours as needed for nausea or vomiting., Disp: 30 tablet, Rfl: 4   sucralfate (CARAFATE) 1 g tablet, Take 1 tablet (1 g total) by mouth 4 (four) times daily -  with meals and at bedtime. Crush 1 tablet in 1 oz water and drink 5 min before meals for radiation induced esophagitis, Disp: 120 tablet, Rfl: 2   tiZANidine (ZANAFLEX) 4 MG tablet, Take 1 tablet (4 mg total) by mouth every 6 (six) hours as needed for muscle spasms (take for tightness and pain in the scapular region)., Disp: 56 tablet, Rfl: 2 No current facility-administered medications for this visit.  Facility-Administered Medications Ordered in Other Visits:    clindamycin (CLEOCIN) 900 mg in dextrose 5 % 50 mL IVPB, 900 mg, Intravenous, 60 min Pre-Op **AND** gentamicin (GARAMYCIN) 350 mg in dextrose 5 % 50 mL IVPB, 5 mg/kg, Intravenous, 60 min Pre-Op, Romie Levee, MD   clindamycin (CLEOCIN) 900 mg in dextrose 5 % 50 mL IVPB, 900 mg, Intravenous, 60 min Pre-Op **AND** gentamicin (GARAMYCIN) 5 mg/kg in dextrose 5 % 50 mL IVPB, 5 mg/kg, Intravenous, 60 min Pre-Op, Romie Levee, MD   fluorouracil (ADRUCIL) 3,500 mg in sodium chloride 0.9 % 80 mL chemo infusion, 1,920 mg/m2 (Treatment Plan Recorded),  Intravenous, 1 day or 1 dose, [name], MD, Infusion Verify at 08/24/22 1431   sodium chloride 0.9 % 1,000 mL with potassium chloride 20 mEq, magnesium sulfate 2 g infusion, , Intravenous, Once, [name], MD   Allergies: Allergies  Allergen Reactions   Demerol [Meperidine] Itching    All over the body.   Penicillins Hives     childhood, does not remember if it spread all over the body or not. Did it involve swelling of the face/tongue/throat, SOB, or low BP? No Did it involve sudden or severe rash/hives, skin peeling, or any reaction on the inside of your mouth or nose? Yes Did you need to seek medical attention at a hospital or doctor's office? Yes When did it last happen?      childhood allergy If all above answers are "NO", may proceed with cephalosporin use.    Levaquin [Levofloxacin] Other (See Comments)    Patient has Aortic Aneurysm and is not indicated with this diagnosis   Other Hives and Other (See Comments)    Fresh coconut    Vancomycin Rash    Will need Benadryl prior to administration per IV. "Red man syndrome"    REVIEW OF SYSTEMS:   Review of Systems  Constitutional:  Negative for chills, fatigue and fever.  HENT:   Positive for trouble swallowing. Negative for lump/mass, mouth sores, nosebleeds and sore throat.   Eyes:  Negative for eye problems.  Respiratory:  Negative for cough and shortness of breath.   Cardiovascular:  Negative for chest pain, leg swelling and palpitations.  Gastrointestinal:  Negative for abdominal pain, constipation, diarrhea, nausea and vomiting.  Genitourinary:  Negative for bladder incontinence, difficulty urinating, dysuria, frequency, hematuria and nocturia.   Musculoskeletal:  Negative for arthralgias, back pain, flank pain, myalgias and neck pain.  Skin:  Negative for itching and rash.  Neurological:  Positive for headaches and numbness. Negative for dizziness.  Hematological:  Does not bruise/bleed easily.   Psychiatric/Behavioral:  Negative for depression, sleep disturbance and suicidal ideas. The patient is not nervous/anxious.   All other systems reviewed and are negative.    VITALS:   Last menstrual period 02/16/2015.  Wt Readings from Last 3 Encounters:  08/24/22 136 lb 1.6 oz (61.7 kg)  08/10/22 135 lb 12.8 oz (61.6 kg)  07/14/22 133 lb 11.2 oz (60.6 kg)    There is no height or weight on file to calculate BMI.  Performance status (ECOG): 1 - Symptomatic but completely ambulatory  PHYSICAL EXAM:   Physical Exam Vitals and nursing note reviewed. Exam conducted with a chaperone present.  Constitutional:      Appearance: Normal appearance.  Cardiovascular:     Rate and Rhythm: Normal rate and regular rhythm.     Pulses: Normal pulses.     Heart sounds: Normal heart sounds.  Pulmonary:     Effort: Pulmonary effort is normal.     Breath sounds: Normal breath sounds.  Abdominal:     Palpations: Abdomen is soft. There is no hepatomegaly, splenomegaly or mass.     Tenderness: There is no abdominal tenderness.  Musculoskeletal:     Right lower leg: No edema.     Left lower leg: No edema.  Lymphadenopathy:     Cervical: No cervical adenopathy.     Right cervical: No superficial, deep or posterior cervical adenopathy.    Left cervical: No superficial, deep or posterior cervical adenopathy.     Upper Body:     Right upper body: No supraclavicular or axillary adenopathy.     Left upper body: No supraclavicular or axillary adenopathy.  Neurological:     General: No focal deficit present.     Mental Status: She is alert and oriented to person, place, and time.  Psychiatric:        Mood and Affect: Mood normal.        Behavior: Behavior normal.     LABS:      Latest Ref Rng & Units 08/24/2022    8:33 AM 08/10/2022    8:11 AM 07/14/2022    8:39 AM  CBC  WBC 4.0 - 10.5 K/uL 2.9  3.1  5.7   Hemoglobin 12.0 - 15.0 g/dL 16.1  09.6  04.5   Hematocrit 36.0 - 46.0 % 30.5  32.1   32.9   Platelets 150 - 400 K/uL 124  100  124       Latest Ref Rng & Units 08/24/2022    8:33 AM 08/10/2022    8:11 AM 07/14/2022    8:39 AM  CMP  Glucose 70 - 99 mg/dL 98  77  98   BUN 6 - 20 mg/dL 8  13  24    Creatinine 0.44 - 1.00 mg/dL 4.09  8.11  9.14   Sodium 135 - 145 mmol/L 134  135  134   Potassium 3.5 - 5.1 mmol/L 3.2  3.3  3.6   Chloride 98 - 111 mmol/L  102  103  102   CO2 22 - 32 mmol/L 20  26  22    Calcium 8.9 - 10.3 mg/dL 8.4  8.5  8.9   Total Protein 6.5 - 8.1 g/dL 6.8  6.5  7.2   Total Bilirubin 0.3 - 1.2 mg/dL 5.4  3.9  2.5   Alkaline Phos 38 - 126 U/L 70  61  115   AST 15 - 41 U/L 24  17  20    ALT 0 - 44 U/L 20  16  19       Lab Results  Component Value Date   CEA1 20.1 (H) 05/11/2022   /  CEA  Date Value Ref Range Status  05/11/2022 20.1 (H) 0.0 - 4.7 ng/mL Final    Comment:    (NOTE)                             Nonsmokers          <3.9                             Smokers             <5.6 Roche [employer]ostics Electrochemiluminescence Immunoassay (ECLIA) Values obtained with different assay methods or kits cannot be used interchangeably.  Results cannot be interpreted as absolute evidence of the presence or absence of malignant disease. Performed At: [facility] [address] [place], [place] [id number] Jolene Schimke MD WU:[id number]    No results found for: "PSA1" No results found for: "CAN199" No results found for: "CAN125"  No results found for: "TOTALPROTELP", "ALBUMINELP", "A1GS", "A2GS", "BETS", "BETA2SER", "GAMS", "MSPIKE", "SPEI" Lab Results  Component Value Date   TIBC 364 05/11/2022   TIBC 385 11/09/2020   FERRITIN 61 05/11/2022   FERRITIN 54 11/09/2020   IRONPCTSAT 26 05/11/2022   IRONPCTSAT 22 11/09/2020   Lab Results  Component Value Date   LDH 154 01/20/2020     STUDIES:   MR Brain W Wo Contrast  Result Date: 08/19/2022 CLINICAL DATA:  Provided history: Rectal cancer metastatic to bone. Metastatic disease  evaluation. Facial numbness. EXAM: MRI HEAD WITHOUT AND WITH CONTRAST TECHNIQUE: Multiplanar, multiecho pulse sequences of the brain and surrounding structures were obtained without and with intravenous contrast. CONTRAST:  6mL GADAVIST GADOBUTROL 1 MMOL/ML IV SOLN COMPARISON:  Cervical spine MRI 07/01/2022. FINDINGS: Brain: Cerebral volume is normal. 3 mm nodular enhancing lesion within the anterior right frontal lobe white matter consistent with a metastasis (series 1100, image 199). Mild multifocal T2 FLAIR hyperintense signal abnormality within the cerebral white matter, nonspecific but most often secondary to chronic small vessel ischemia. There is no acute infarct. No chronic intracranial blood products. No extra-axial fluid collection. No midline shift. Vascular: Maintained flow voids within the proximal large arterial vessels. Skull and upper cervical spine: 4 mm enhancing focus within the left frontal calvarium (series 1100, image 256). Sinuses/Orbits: No mass or acute finding within the imaged orbits. No significant paranasal sinus disease. Impressions #1 and #2 will be called to the ordering clinician or representative by the Radiologist Assistant, and communication documented in the PACS or [employer]. IMPRESSION: 1. 3 mm nodular enhancing lesion within the anterior right frontal lobe white matter consistent with a metastasis. 2. 4 mm enhancing focus within the left frontal calvarium. Although nonspecific, an osseous metastasis cannot be excluded. Attention recommended on imaging follow-up. 3. Mild chronic cerebral  white matter disease, nonspecific but most often secondary to chronic small vessel ischemia. Electronically Signed   By: Jackey Loge D.O.   On: 08/19/2022 09:52

## 2022-08-23 NOTE — Telephone Encounter (Signed)
Left a message introducing myself. Requested a call back if she has questions about the planned simulation appointment on Thursday, 5/30. I will review the remaining SRS treatment planning visits with her when we meet on Thursday.   Jalene Mullet R.T.(R)(T) Radiation Special Procedures Navigator

## 2022-08-24 ENCOUNTER — Encounter: Payer: Self-pay | Admitting: Hematology

## 2022-08-24 ENCOUNTER — Inpatient Hospital Stay: Payer: 59

## 2022-08-24 ENCOUNTER — Other Ambulatory Visit (HOSPITAL_COMMUNITY): Payer: Self-pay

## 2022-08-24 ENCOUNTER — Inpatient Hospital Stay (HOSPITAL_BASED_OUTPATIENT_CLINIC_OR_DEPARTMENT_OTHER): Payer: 59 | Admitting: Hematology

## 2022-08-24 ENCOUNTER — Encounter (HOSPITAL_COMMUNITY): Payer: Self-pay | Admitting: Hematology

## 2022-08-24 VITALS — BP 103/73 | HR 89 | Temp 97.4°F | Resp 18

## 2022-08-24 VITALS — BP 108/79 | HR 107 | Temp 99.3°F | Resp 20 | Wt 136.1 lb

## 2022-08-24 DIAGNOSIS — Z95828 Presence of other vascular implants and grafts: Secondary | ICD-10-CM

## 2022-08-24 DIAGNOSIS — Z5111 Encounter for antineoplastic chemotherapy: Secondary | ICD-10-CM | POA: Diagnosis not present

## 2022-08-24 DIAGNOSIS — C20 Malignant neoplasm of rectum: Secondary | ICD-10-CM

## 2022-08-24 DIAGNOSIS — C19 Malignant neoplasm of rectosigmoid junction: Secondary | ICD-10-CM

## 2022-08-24 DIAGNOSIS — C787 Secondary malignant neoplasm of liver and intrahepatic bile duct: Secondary | ICD-10-CM | POA: Diagnosis not present

## 2022-08-24 DIAGNOSIS — E876 Hypokalemia: Secondary | ICD-10-CM

## 2022-08-24 LAB — MAGNESIUM: Magnesium: 2.3 mg/dL (ref 1.7–2.4)

## 2022-08-24 LAB — COMPREHENSIVE METABOLIC PANEL
ALT: 20 U/L (ref 0–44)
AST: 24 U/L (ref 15–41)
Albumin: 3.6 g/dL (ref 3.5–5.0)
Alkaline Phosphatase: 70 U/L (ref 38–126)
Anion gap: 12 (ref 5–15)
BUN: 8 mg/dL (ref 6–20)
CO2: 20 mmol/L — ABNORMAL LOW (ref 22–32)
Calcium: 8.4 mg/dL — ABNORMAL LOW (ref 8.9–10.3)
Chloride: 102 mmol/L (ref 98–111)
Creatinine, Ser: 0.91 mg/dL (ref 0.44–1.00)
GFR, Estimated: >60 mL/min (ref >60)
Glucose, Bld: 98 mg/dL (ref 70–99)
Potassium: 3.2 mmol/L — ABNORMAL LOW (ref 3.5–5.1)
Sodium: 134 mmol/L — ABNORMAL LOW (ref 135–145)
Total Bilirubin: 5.4 mg/dL — ABNORMAL HIGH (ref 0.3–1.2)
Total Protein: 6.8 g/dL (ref 6.5–8.1)

## 2022-08-24 LAB — URINALYSIS, DIPSTICK ONLY
Bilirubin Urine: NEGATIVE
Glucose, UA: NEGATIVE mg/dL
Hgb urine dipstick: NEGATIVE
Ketones, ur: NEGATIVE mg/dL
Leukocytes,Ua: NEGATIVE
Nitrite: NEGATIVE
Protein, ur: NEGATIVE mg/dL
Specific Gravity, Urine: 1.012 (ref 1.005–1.030)
pH: 5 (ref 5.0–8.0)

## 2022-08-24 LAB — CBC WITH DIFFERENTIAL/PLATELET
Abs Immature Granulocytes: 0.01 10*3/uL (ref 0.00–0.07)
Basophils Absolute: 0 10*3/uL (ref 0.0–0.1)
Basophils Relative: 0 %
Eosinophils Absolute: 0 10*3/uL (ref 0.0–0.5)
Eosinophils Relative: 0 %
HCT: 30.5 % — ABNORMAL LOW (ref 36.0–46.0)
Hemoglobin: 10.6 g/dL — ABNORMAL LOW (ref 12.0–15.0)
Immature Granulocytes: 0 %
Lymphocytes Relative: 20 %
Lymphs Abs: 0.6 10*3/uL — ABNORMAL LOW (ref 0.7–4.0)
MCH: 36.2 pg — ABNORMAL HIGH (ref 26.0–34.0)
MCHC: 34.8 g/dL (ref 30.0–36.0)
MCV: 104.1 fL — ABNORMAL HIGH (ref 80.0–100.0)
Monocytes Absolute: 0.2 10*3/uL (ref 0.1–1.0)
Monocytes Relative: 8 %
Neutro Abs: 2 10*3/uL (ref 1.7–7.7)
Neutrophils Relative %: 72 %
Platelets: 124 10*3/uL — ABNORMAL LOW (ref 150–400)
RBC: 2.93 MIL/uL — ABNORMAL LOW (ref 3.87–5.11)
RDW: 16.6 % — ABNORMAL HIGH (ref 11.5–15.5)
WBC: 2.9 10*3/uL — ABNORMAL LOW (ref 4.0–10.5)
nRBC: 0 % (ref 0.0–0.2)

## 2022-08-24 MED ORDER — SODIUM CHLORIDE 0.9 % IV SOLN: Freq: Once | INTRAVENOUS | Status: AC

## 2022-08-24 MED ORDER — SODIUM CHLORIDE 0.9 % IV SOLN
5.0000 mg/kg | Freq: Once | INTRAVENOUS | Status: AC
  Administered 2022-08-24: 300 mg via INTRAVENOUS
  Filled 2022-08-24: qty 12

## 2022-08-24 MED ORDER — FLUOROURACIL CHEMO INJECTION 500 MG/10ML
320.0000 mg/m2 | Freq: Once | INTRAVENOUS | Status: AC
  Administered 2022-08-24: 500 mg via INTRAVENOUS
  Filled 2022-08-24: qty 10

## 2022-08-24 MED ORDER — PALONOSETRON HCL INJECTION 0.25 MG/5ML
0.2500 mg | Freq: Once | INTRAVENOUS | Status: AC
  Administered 2022-08-24: 0.25 mg via INTRAVENOUS
  Filled 2022-08-24: qty 5

## 2022-08-24 MED ORDER — POTASSIUM CHLORIDE 20 MEQ PO PACK
40.0000 meq | PACK | Freq: Once | ORAL | Status: AC
  Administered 2022-08-24: 40 meq via ORAL
  Filled 2022-08-24: qty 2

## 2022-08-24 MED ORDER — SODIUM CHLORIDE 0.9% FLUSH
10.0000 mL | Freq: Once | INTRAVENOUS | Status: AC
  Administered 2022-08-24: 10 mL via INTRAVENOUS

## 2022-08-24 MED ORDER — SODIUM CHLORIDE 0.9 % IV SOLN
1920.0000 mg/m2 | INTRAVENOUS | Status: DC
  Administered 2022-08-24: 3500 mg via INTRAVENOUS
  Filled 2022-08-24: qty 70

## 2022-08-24 MED ORDER — SODIUM CHLORIDE 0.9 % IV SOLN
10.0000 mg | Freq: Once | INTRAVENOUS | Status: AC
  Administered 2022-08-24: 10 mg via INTRAVENOUS
  Filled 2022-08-24: qty 10

## 2022-08-24 MED ORDER — SODIUM CHLORIDE 0.9 % IV SOLN
320.0000 mg/m2 | Freq: Once | INTRAVENOUS | Status: AC
  Administered 2022-08-24: 540 mg via INTRAVENOUS
  Filled 2022-08-24: qty 27

## 2022-08-24 NOTE — Progress Notes (Signed)
Has armband been applied?  Yes  Does patient have an allergy to IV contrast dye?: No   Has patient ever received premedication for IV contrast dye?:  n/a  Does patient take metformin?: No  If patient does take metformin when was the last dose: n/a  Date of lab work: 08/24/2022 BUN: 8 CR: 0.91 eGfr: >60  IV site: Left AC  Has IV site been added to flowsheet?  Yes  BP 115/77   Pulse 99   Temp 98.3 F (36.8 C)   Resp 18   Ht 5' 6.5" (1.689 m)   Wt 140 lb 3.2 oz (63.6 kg)   LMP 02/16/2015 (Approximate)   SpO2 100%   BMI 22.29 kg/m

## 2022-08-24 NOTE — Patient Instructions (Signed)
[facility]  Discharge Instructions: Thank you for choosing [facility] to provide your oncology and hematology care.  If you have a lab appointment with the Cancer Center - please note that after April 8th, 2024, all labs will be drawn in the cancer center.  You do not have to check in or register with the main entrance as you have in the past but will complete your check-in in the cancer center.  Wear comfortable clothing and clothing appropriate for easy access to any Portacath or PICC line.   We strive to give you quality time with your provider. You may need to reschedule your appointment if you arrive late (15 or more minutes).  Arriving late affects you and other patients whose appointments are after yours.  Also, if you miss three or more appointments without notifying the office, you may be dismissed from the clinic at the provider's discretion.      For prescription refill requests, have your pharmacy contact our office and allow 72 hours for refills to be completed.    Today you received the following chemotherapy and/or immunotherapy agents Avastin and Folfox   To help prevent nausea and vomiting after your treatment, we encourage you to take your nausea medication as directed.  Bevacizumab Injection What is this medication? BEVACIZUMAB (be va SIZ yoo mab) treats some types of cancer. It works by blocking a protein that causes cancer cells to grow and multiply. This helps to slow or stop the spread of cancer cells. It is a monoclonal antibody. This medicine may be used for other purposes; ask your health care provider or pharmacist if you have questions. COMMON BRAND NAME(S): Alymsys, Avastin, MVASI, Omer Jack What should I tell my care team before I take this medication? They need to know if you have any of these conditions: Blood clots Coughing up blood Having or recent surgery Heart failure High blood pressure History of a connection between  2 or more body parts that do not usually connect (fistula) History of a tear in your stomach or intestines Protein in your urine An unusual or allergic reaction to bevacizumab, other medications, foods, dyes, or preservatives Pregnant or trying to get pregnant Breast-feeding How should I use this medication? This medication is injected into a vein. It is given by your care team in a hospital or clinic setting. Talk to your care team the use of this medication in children. Special care may be needed. Overdosage: If you think you have taken too much of this medicine contact a poison control center or emergency room at once. NOTE: This medicine is only for you. Do not share this medicine with others. What if I miss a dose? Keep appointments for follow-up doses. It is important not to miss your dose. Call your care team if you are unable to keep an appointment. What may interact with this medication? Interactions are not expected. This list may not describe all possible interactions. Give your health care provider a list of all the medicines, herbs, non-prescription drugs, or dietary supplements you use. Also tell them if you smoke, drink alcohol, or use illegal drugs. Some items may interact with your medicine. What should I watch for while using this medication? Your condition will be monitored carefully while you are receiving this medication. You may need blood work while taking this medication. This medication may make you feel generally unwell. This is not uncommon as chemotherapy can affect healthy cells as well as cancer cells.  Report any side effects. Continue your course of treatment even though you feel ill unless your care team tells you to stop. This medication may increase your risk to bruise or bleed. Call your care team if you notice any unusual bleeding. Before having surgery, talk to your care team to make sure it is ok. This medication can increase the risk of poor healing of your  surgical site or wound. You will need to stop this medication for 28 days before surgery. After surgery, wait at least 28 days before restarting this medication. Make sure the surgical site or wound is healed enough before restarting this medication. Talk to your care team if questions. Talk to your care team if you may be pregnant. Serious birth defects can occur if you take this medication during pregnancy and for 6 months after the last dose. Contraception is recommended while taking this medication and for 6 months after the last dose. Your care team can help you find the option that works for you. Do not breastfeed while taking this medication and for 6 months after the last dose. This medication can cause infertility. Talk to your care team if you are concerned about your fertility. What side effects may I notice from receiving this medication? Side effects that you should report to your care team as soon as possible: Allergic reactions--skin rash, itching, hives, swelling of the face, lips, tongue, or throat Bleeding--bloody or black, tar-like stools, vomiting blood or brown material that looks like coffee grounds, red or dark brown urine, small red or purple spots on skin, unusual bruising or bleeding Blood clot--pain, swelling, or warmth in the leg, shortness of breath, chest pain Heart attack--pain or tightness in the chest, shoulders, arms, or jaw, nausea, shortness of breath, cold or clammy skin, feeling faint or lightheaded Heart failure--shortness of breath, swelling of the ankles, feet, or hands, sudden weight gain, unusual weakness or fatigue Increase in blood pressure Infection--fever, chills, cough, sore throat, wounds that don't heal, pain or trouble when passing urine, general feeling of discomfort or being unwell Infusion reactions--chest pain, shortness of breath or trouble breathing, feeling faint or lightheaded Kidney injury--decrease in the amount of urine, swelling of the  ankles, hands, or feet Stomach pain that is severe, does not go away, or gets worse Stroke--sudden numbness or weakness of the face, arm, or leg, trouble speaking, confusion, trouble walking, loss of balance or coordination, dizziness, severe headache, change in vision Sudden and severe headache, confusion, change in vision, seizures, which may be signs of posterior reversible encephalopathy syndrome (PRES) Side effects that usually do not require medical attention (report to your care team if they continue or are bothersome): Back pain Change in taste Diarrhea Dry skin Increased tears Nosebleed This list may not describe all possible side effects. Call your doctor for medical advice about side effects. You may report side effects to FDA at 1-800-FDA-1088. Where should I keep my medication? This medication is given in a hospital or clinic. It will not be stored at home. NOTE: This sheet is a summary. It may not cover all possible information. If you have questions about this medicine, talk to your doctor, pharmacist, or health care provider.  2024 Elsevier/Gold Standard (2021-07-30 00:00:00)  Leucovorin Injection What is this medication? LEUCOVORIN (loo koe VOR in) prevents side effects from certain medications, such as methotrexate. It works by increasing folate levels. This helps protect healthy cells in your body. It may also be used to treat anemia caused by low  levels of folate. It can also be used with fluorouracil, a type of chemotherapy, to treat colorectal cancer. It works by increasing the effects of fluorouracil in the body. This medicine may be used for other purposes; ask your health care provider or pharmacist if you have questions. What should I tell my care team before I take this medication? They need to know if you have any of these conditions: Anemia from low levels of vitamin B12 in the blood An unusual or allergic reaction to leucovorin, folic acid, other medications,  foods, dyes, or preservatives Pregnant or trying to get pregnant Breastfeeding How should I use this medication? This medication is injected into a vein or a muscle. It is given by your care team in a hospital or clinic setting. Talk to your care team about the use of this medication in children. Special care may be needed. Overdosage: If you think you have taken too much of this medicine contact a poison control center or emergency room at once. NOTE: This medicine is only for you. Do not share this medicine with others. What if I miss a dose? Keep appointments for follow-up doses. It is important not to miss your dose. Call your care team if you are unable to keep an appointment. What may interact with this medication? Capecitabine Fluorouracil Phenobarbital Phenytoin Primidone Trimethoprim;sulfamethoxazole This list may not describe all possible interactions. Give your health care provider a list of all the medicines, herbs, non-prescription drugs, or dietary supplements you use. Also tell them if you smoke, drink alcohol, or use illegal drugs. Some items may interact with your medicine. What should I watch for while using this medication? Your condition will be monitored carefully while you are receiving this medication. This medication may increase the side effects of 5-fluorouracil. Tell your care team if you have diarrhea or mouth sores that do not get better or that get worse. What side effects may I notice from receiving this medication? Side effects that you should report to your care team as soon as possible: Allergic reactions--skin rash, itching, hives, swelling of the face, lips, tongue, or throat This list may not describe all possible side effects. Call your doctor for medical advice about side effects. You may report side effects to FDA at 1-800-FDA-1088. Where should I keep my medication? This medication is given in a hospital or clinic. It will not be stored at  home. NOTE: This sheet is a summary. It may not cover all possible information. If you have questions about this medicine, talk to your doctor, pharmacist, or health care provider.  2024 Elsevier/Gold Standard (2021-08-17 00:00:00)  Fluorouracil Injection What is this medication? FLUOROURACIL (flure oh YOOR a sil) treats some types of cancer. It works by slowing down the growth of cancer cells. This medicine may be used for other purposes; ask your health care provider or pharmacist if you have questions. COMMON BRAND NAME(S): Adrucil What should I tell my care team before I take this medication? They need to know if you have any of these conditions: Blood disorders Dihydropyrimidine dehydrogenase (DPD) deficiency Infection, such as chickenpox, cold sores, herpes Kidney disease Liver disease Poor nutrition Recent or ongoing radiation therapy An unusual or allergic reaction to fluorouracil, other medications, foods, dyes, or preservatives If you or your partner are pregnant or trying to get pregnant Breast-feeding How should I use this medication? This medication is injected into a vein. It is administered by your care team in a hospital or clinic setting. Talk to  your care team about the use of this medication in children. Special care may be needed. Overdosage: If you think you have taken too much of this medicine contact a poison control center or emergency room at once. NOTE: This medicine is only for you. Do not share this medicine with others. What if I miss a dose? Keep appointments for follow-up doses. It is important not to miss your dose. Call your care team if you are unable to keep an appointment. What may interact with this medication? Do not take this medication with any of the following: Live virus vaccines This medication may also interact with the following: Medications that treat or prevent blood clots, such as warfarin, enoxaparin, dalteparin This list may not  describe all possible interactions. Give your health care provider a list of all the medicines, herbs, non-prescription drugs, or dietary supplements you use. Also tell them if you smoke, drink alcohol, or use illegal drugs. Some items may interact with your medicine. What should I watch for while using this medication? Your condition will be monitored carefully while you are receiving this medication. This medication may make you feel generally unwell. This is not uncommon as chemotherapy can affect healthy cells as well as cancer cells. Report any side effects. Continue your course of treatment even though you feel ill unless your care team tells you to stop. In some cases, you may be given additional medications to help with side effects. Follow all directions for their use. This medication may increase your risk of getting an infection. Call your care team for advice if you get a fever, chills, sore throat, or other symptoms of a cold or flu. Do not treat yourself. Try to avoid being around people who are sick. This medication may increase your risk to bruise or bleed. Call your care team if you notice any unusual bleeding. Be careful brushing or flossing your teeth or using a toothpick because you may get an infection or bleed more easily. If you have any dental work done, tell your dentist you are receiving this medication. Avoid taking medications that contain aspirin, acetaminophen, ibuprofen, naproxen, or ketoprofen unless instructed by your care team. These medications may hide a fever. Do not treat diarrhea with over the counter products. Contact your care team if you have diarrhea that lasts more than 2 days or if it is severe and watery. This medication can make you more sensitive to the sun. Keep out of the sun. If you cannot avoid being in the sun, wear protective clothing and sunscreen. Do not use sun lamps, tanning beds, or tanning booths. Talk to your care team if you or your partner  wish to become pregnant or think you might be pregnant. This medication can cause serious birth defects if taken during pregnancy and for 3 months after the last dose. A reliable form of contraception is recommended while taking this medication and for 3 months after the last dose. Talk to your care team about effective forms of contraception. Do not father a child while taking this medication and for 3 months after the last dose. Use a condom while having sex during this time period. Do not breastfeed while taking this medication. This medication may cause infertility. Talk to your care team if you are concerned about your fertility. What side effects may I notice from receiving this medication? Side effects that you should report to your care team as soon as possible: Allergic reactions--skin rash, itching, hives, swelling of the face,  lips, tongue, or throat Heart attack--pain or tightness in the chest, shoulders, arms, or jaw, nausea, shortness of breath, cold or clammy skin, feeling faint or lightheaded Heart failure--shortness of breath, swelling of the ankles, feet, or hands, sudden weight gain, unusual weakness or fatigue Heart rhythm changes--fast or irregular heartbeat, dizziness, feeling faint or lightheaded, chest pain, trouble breathing High ammonia level--unusual weakness or fatigue, confusion, loss of appetite, nausea, vomiting, seizures Infection--fever, chills, cough, sore throat, wounds that don't heal, pain or trouble when passing urine, general feeling of discomfort or being unwell Low red blood cell level--unusual weakness or fatigue, dizziness, headache, trouble breathing Pain, tingling, or numbness in the hands or feet, muscle weakness, change in vision, confusion or trouble speaking, loss of balance or coordination, trouble walking, seizures Redness, swelling, and blistering of the skin over hands and feet Severe or prolonged diarrhea Unusual bruising or bleeding Side effects  that usually do not require medical attention (report to your care team if they continue or are bothersome): Dry skin Headache Increased tears Nausea Pain, redness, or swelling with sores inside the mouth or throat Sensitivity to light Vomiting This list may not describe all possible side effects. Call your doctor for medical advice about side effects. You may report side effects to FDA at 1-800-FDA-1088. Where should I keep my medication? This medication is given in a hospital or clinic. It will not be stored at home. NOTE: This sheet is a summary. It may not cover all possible information. If you have questions about this medicine, talk to your doctor, pharmacist, or health care provider.  2024 Elsevier/Gold Standard (2021-07-20 00:00:00)     BELOW ARE SYMPTOMS THAT SHOULD BE REPORTED IMMEDIATELY: *FEVER GREATER THAN 100.4 F (38 C) OR HIGHER *CHILLS OR SWEATING *NAUSEA AND VOMITING THAT IS NOT CONTROLLED WITH YOUR NAUSEA MEDICATION *UNUSUAL SHORTNESS OF BREATH *UNUSUAL BRUISING OR BLEEDING *URINARY PROBLEMS (pain or burning when urinating, or frequent urination) *BOWEL PROBLEMS (unusual diarrhea, constipation, pain near the anus) TENDERNESS IN MOUTH AND THROAT WITH OR WITHOUT PRESENCE OF ULCERS (sore throat, sores in mouth, or a toothache) UNUSUAL RASH, SWELLING OR PAIN  UNUSUAL VAGINAL DISCHARGE OR ITCHING   Items with * indicate a potential emergency and should be followed up as soon as possible or go to the Emergency Department if any problems should occur.  Please show the CHEMOTHERAPY ALERT CARD or IMMUNOTHERAPY ALERT CARD at check-in to the Emergency Department and triage nurse.  Should you have questions after your visit or need to cancel or reschedule your appointment, please contact [facility] [phone]  and follow the prompts.  Office hours are 8:00 a.m. to 4:30 p.m. Monday - Friday. Please note that voicemails left after 4:00 p.m. may not be  returned until the following business day.  We are closed weekends and major holidays. You have access to a nurse at all times for urgent questions. Please call the main number to the clinic [phone] and follow the prompts.  For any non-urgent questions, you may also contact your provider using MyChart. We now offer e-Visits for anyone 75 and older to request care online for non-urgent symptoms. For details visit [email or web address].   Also download the MyChart app! Go to the app store, search "MyChart", open the app, select [facility], and log in with your MyChart username and password.

## 2022-08-24 NOTE — Progress Notes (Signed)
Patient presents today for Avastin/Folfox infusion. Patient is in satisfactory condition with no new complaints voiced.  Vital signs are stable.  Labs reviewed by Dr. Ellin Saba during the office visit and all other labs are within treatment parameters. Pt's Bilirubin is 5.4, and potassium is 3.2 today. Pt will receive 40 mEq potassium chloride liquid x 1 dose due to pt's swallowing difficulty. We will hold Oxaliplatin today per Dr.K.  We will proceed with treatment per MD orders.   Treatment given today per MD orders. Tolerated infusion without adverse affects. Vital signs stable. No complaints at this time. Discharged from clinic ambulatory in stable condition. Alert and oriented x 3. F/U with [facility] as scheduled. 5FU ambulatory pump infusing.

## 2022-08-24 NOTE — Progress Notes (Signed)
Continue with premedication for chemotherapy today despite holding oxaliplatin.  T.O. Dr Katragadda/Ruthann Angulo, PharmD 

## 2022-08-24 NOTE — Progress Notes (Signed)
Patient has been examined by Dr. Ellin Saba. Vital signs and labs have been reviewed by MD - ANC, Creatinine, LFTs (bilirubin 5.4), hemoglobin, and platelets are within treatment parameters per M.D. - pt may proceed with treatment. Hold oxaliplatin today per MD. Primary RN and pharmacy notified.

## 2022-08-24 NOTE — Patient Instructions (Signed)
[facility] Discharge Instructions   You were seen and examined today by Dr. Katragadda.  He reviewed the results of your lab work which are normal/stable.   We will proceed with your treatment today.  Return as scheduled.    Thank you for choosing [facility] to provide your oncology and hematology care.  To afford each patient quality time with our provider, please arrive at least 15 minutes before your scheduled appointment time.   If you have a lab appointment with the Cancer Center please come in thru the Main Entrance and check in at the main information desk.  You need to re-schedule your appointment should you arrive 10 or more minutes late.  We strive to give you quality time with our providers, and arriving late affects you and other patients whose appointments are after yours.  Also, if you no show three or more times for appointments you may be dismissed from the clinic at the providers discretion.     Again, thank you for choosing [facility].  Our hope is that these requests will decrease the amount of time that you wait before being seen by our physicians.       _____________________________________________________________  Should you have questions after your visit to [facility], please contact our office at [phone] and follow the prompts.  Our office hours are 8:00 a.m. and 4:30 p.m. Monday - Friday.  Please note that voicemails left after 4:00 p.m. may not be returned until the following business day.  We are closed weekends and major holidays.  You do have access to a nurse 24-7, just call the main number to the clinic [phone] and do not press any options, hold on the line and a nurse will answer the phone.    For prescription refill requests, have your pharmacy contact our office and allow 72 hours.    Due to Covid, you will need to wear a mask upon entering  the hospital. If you do not have a mask, a mask will be given to you at the Main Entrance upon arrival. For doctor visits, patients may have 1 support person age 18 or older with them. For treatment visits, patients can not have anyone with them due to social distancing guidelines and our immunocompromised population.      

## 2022-08-25 ENCOUNTER — Ambulatory Visit
Admission: RE | Admit: 2022-08-25 | Discharge: 2022-08-25 | Disposition: A | Discharge status: Home / Self Care | Payer: 59 | Source: Ambulatory Visit | Attending: Radiation Oncology | Admitting: Radiation Oncology

## 2022-08-25 VITALS — BP 115/77 | HR 99 | Temp 98.3°F | Resp 18 | Ht 66.5 in | Wt 140.2 lb

## 2022-08-25 DIAGNOSIS — C7931 Secondary malignant neoplasm of brain: Secondary | ICD-10-CM

## 2022-08-25 DIAGNOSIS — C7951 Secondary malignant neoplasm of bone: Secondary | ICD-10-CM | POA: Diagnosis not present

## 2022-08-25 DIAGNOSIS — Z510 Encounter for antineoplastic radiation therapy: Secondary | ICD-10-CM | POA: Diagnosis not present

## 2022-08-25 DIAGNOSIS — C7800 Secondary malignant neoplasm of unspecified lung: Secondary | ICD-10-CM | POA: Diagnosis not present

## 2022-08-25 DIAGNOSIS — Z923 Personal history of irradiation: Secondary | ICD-10-CM | POA: Diagnosis not present

## 2022-08-25 DIAGNOSIS — C20 Malignant neoplasm of rectum: Secondary | ICD-10-CM | POA: Diagnosis not present

## 2022-08-25 DIAGNOSIS — C787 Secondary malignant neoplasm of liver and intrahepatic bile duct: Secondary | ICD-10-CM | POA: Diagnosis not present

## 2022-08-25 LAB — CEA: CEA: 7.1 ng/mL — ABNORMAL HIGH (ref 0.0–4.7)

## 2022-08-25 MED ORDER — SODIUM CHLORIDE 0.9% FLUSH
10.0000 mL | Freq: Once | INTRAVENOUS | Status: AC
  Administered 2022-08-25: 10 mL via INTRAVENOUS

## 2022-08-26 ENCOUNTER — Inpatient Hospital Stay: Payer: 59

## 2022-08-26 VITALS — BP 92/72 | HR 85 | Temp 98.5°F | Resp 18

## 2022-08-26 DIAGNOSIS — C20 Malignant neoplasm of rectum: Secondary | ICD-10-CM

## 2022-08-26 DIAGNOSIS — C7951 Secondary malignant neoplasm of bone: Secondary | ICD-10-CM | POA: Diagnosis not present

## 2022-08-26 DIAGNOSIS — C787 Secondary malignant neoplasm of liver and intrahepatic bile duct: Secondary | ICD-10-CM | POA: Diagnosis not present

## 2022-08-26 DIAGNOSIS — Z5111 Encounter for antineoplastic chemotherapy: Secondary | ICD-10-CM | POA: Diagnosis not present

## 2022-08-26 DIAGNOSIS — C7800 Secondary malignant neoplasm of unspecified lung: Secondary | ICD-10-CM | POA: Diagnosis not present

## 2022-08-26 DIAGNOSIS — Z510 Encounter for antineoplastic radiation therapy: Secondary | ICD-10-CM | POA: Diagnosis not present

## 2022-08-26 DIAGNOSIS — Z923 Personal history of irradiation: Secondary | ICD-10-CM | POA: Diagnosis not present

## 2022-08-26 DIAGNOSIS — C7931 Secondary malignant neoplasm of brain: Secondary | ICD-10-CM | POA: Diagnosis not present

## 2022-08-26 MED ORDER — HEPARIN SOD (PORK) LOCK FLUSH 100 UNIT/ML IV SOLN
500.0000 [IU] | Freq: Once | INTRAVENOUS | Status: AC | PRN
  Administered 2022-08-26: 500 [IU]

## 2022-08-26 MED ORDER — SODIUM CHLORIDE 0.9% FLUSH
10.0000 mL | INTRAVENOUS | Status: DC | PRN
  Administered 2022-08-26: 10 mL

## 2022-08-26 NOTE — Progress Notes (Signed)
Patient presents today for pump d/c. Vital signs are stable. Port a cath site clean, dry, and intact. Port flushed with 10 mls of Normal Saline and 500 Units of Heparin. Needle removed intact. Band aid applied. Patient has no complaints at this time. Discharged from clinic ambulatory and in stable condition. Patient alert and oriented.  

## 2022-08-30 DIAGNOSIS — C7931 Secondary malignant neoplasm of brain: Secondary | ICD-10-CM | POA: Diagnosis not present

## 2022-08-31 ENCOUNTER — Other Ambulatory Visit: Payer: Self-pay

## 2022-08-31 ENCOUNTER — Ambulatory Visit
Admission: RE | Admit: 2022-08-31 | Discharge: 2022-08-31 | Disposition: A | Discharge status: Home / Self Care | Payer: 59 | Source: Ambulatory Visit | Attending: Radiation Oncology | Admitting: Radiation Oncology

## 2022-08-31 ENCOUNTER — Ambulatory Visit: Payer: 59

## 2022-08-31 ENCOUNTER — Other Ambulatory Visit: Payer: Self-pay | Admitting: Radiation Therapy

## 2022-08-31 ENCOUNTER — Ambulatory Visit: Payer: 59 | Admitting: Hematology

## 2022-08-31 ENCOUNTER — Encounter: Payer: Self-pay | Admitting: Radiation Oncology

## 2022-08-31 ENCOUNTER — Other Ambulatory Visit: Payer: 59

## 2022-08-31 DIAGNOSIS — C7949 Secondary malignant neoplasm of other parts of nervous system: Secondary | ICD-10-CM | POA: Insufficient documentation

## 2022-08-31 DIAGNOSIS — C20 Malignant neoplasm of rectum: Secondary | ICD-10-CM | POA: Diagnosis not present

## 2022-08-31 DIAGNOSIS — C7931 Secondary malignant neoplasm of brain: Secondary | ICD-10-CM | POA: Insufficient documentation

## 2022-08-31 DIAGNOSIS — Z510 Encounter for antineoplastic radiation therapy: Secondary | ICD-10-CM | POA: Diagnosis not present

## 2022-08-31 LAB — RAD ONC ARIA SESSION SUMMARY
Course Elapsed Days: 0
Plan Fractions Treated to Date: 1
Plan Prescribed Dose Per Fraction: 20 Gy
Plan Total Fractions Prescribed: 1
Plan Total Prescribed Dose: 20 Gy
Reference Point Dosage Given to Date: 20 Gy
Reference Point Session Dosage Given: 20 Gy
Session Number: 1

## 2022-08-31 NOTE — Progress Notes (Signed)
Samantha Reynolds rested with [name] for 30 minutes following her SRS treatment.  Patient denies headache, dizziness, nausea, diplopia or ringing in the ears. Denies fatigue. Patient without complaints. Understands to avoid strenuous activity for the next 24 hours and call [phone] with needs.   BP 115/77 (BP Location: Left Arm, Patient Position: Sitting, Cuff Size: Normal)   Pulse (!) 103   Temp 97.9 F (36.6 C)   Resp 18   Ht 5' 6.5" (1.689 m)   LMP 02/16/2015 (Approximate)   SpO2 100%   BMI 22.29 kg/m    Lind Covert RN, BSN

## 2022-08-31 NOTE — Progress Notes (Signed)
Patient ID: Samantha Reynolds, female   DOB: 13-Jan-1966, 57 y.o.   MRN: [id number]  Name: Samantha Reynolds  MRN: [id number]  Date: 08/31/2022   DOB: 1965/07/11  Stereotactic Radiosurgery Operative Note  PRE-OPERATIVE DIAGNOSIS:  Solitary Brain Metastasis  POST-OPERATIVE DIAGNOSIS:  Solitary Brain Metastasis  PROCEDURE:  Stereotactic Radiosurgery  SURGEON:  Tia Alert, MD  NARRATIVE: The patient underwent a radiation treatment planning session in the radiation oncology simulation suite under the care of the radiation oncology physician and physicist.  I participated closely in the radiation treatment planning afterwards. The patient underwent planning CT which was fused to 3T high resolution MRI with 1 mm axial slices.  These images were fused on the planning system.  We contoured the gross target volumes and subsequently expanded this to yield the Planning Target Volume. I actively participated in the planning process.  I helped to define and review the target contours and also the contours of the optic pathway, eyes, brainstem and selected nearby organs at risk.  All the dose constraints for critical structures were reviewed and compared to AAPM Task Group 101.  The prescription dose conformity was reviewed.  I approved the plan electronically.    Accordingly, Samantha Reynolds was brought to the TrueBeam stereotactic radiation treatment linac and placed in the custom immobilization mask.  The patient was aligned according to the IR fiducial markers with BrainLab Exactrac, then orthogonal x-rays were used in ExacTrac with the 6DOF robotic table and the shifts were made to align the patient  Samantha Reynolds received stereotactic radiosurgery uneventfully.    The detailed description of the procedure is recorded in the radiation oncology procedure note.  I was present for the duration of the procedure.  DISPOSITION:  Following delivery, the patient was transported to nursing in stable condition and  monitored for possible acute effects to be discharged to home in stable condition with follow-up in one month.  Tia Alert, MD 08/31/2022 3:44 PM

## 2022-09-01 ENCOUNTER — Other Ambulatory Visit: Payer: Self-pay

## 2022-09-05 DIAGNOSIS — C20 Malignant neoplasm of rectum: Secondary | ICD-10-CM | POA: Diagnosis not present

## 2022-09-05 DIAGNOSIS — C787 Secondary malignant neoplasm of liver and intrahepatic bile duct: Secondary | ICD-10-CM | POA: Diagnosis not present

## 2022-09-06 NOTE — Progress Notes (Signed)
[facility] [address] [place] [address]    Clinic Day:  10/05/2022  Referring physician: Babs Sciara, MD  Patient Care Team: Babs Sciara, MD as PCP - General (Family Medicine) [name], MD as Medical Oncologist (Medical Oncology)   ASSESSMENT & PLAN:   Assessment: 1.  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  Liver biopsy on 03/09/2018 consistent with adenocarcinoma. 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 -7 cycles of FOLFOX with vectibix completed on 06/27/2018. -XRT with Xeloda from 07/30/2018 through 09/06/2018. -Right liver lesion microwave ablation on 10/15/2018 at [facility]. -Low anterior resection and diverting ileostomy on 12/07/2018, pathology [id number], 0/6 lymph nodes positive, margins negative. -Loop ileostomy reversal on 04/17/2019. - Microwave ablation of the right hepatic lobe lesion by Dr. Selena Batten around 08/15/2019. -SBRT to the left lower lobe lung lesion and left liver lobe lesion on 12/13/2019 at [facility]. - Right upper lobe lesion SBRT from 08/06/2020 through 08/13/2020. - 3 left lung lesions and subcarinal lymph node IMRT from 09/21/2021 through 10/05/2021. - Liver biopsy (06/09/2022): Metastatic moderately differentiated colonic adenocarcinoma. - NGS: KRAS/NRAS/BRAF wild-type, MSI-stable, HER2-0, negative for other targetable mutations - Cycle 1 of FOLFOX and bevacizumab on 07/06/2022 - XRT to the right neck from 07/14/2022 through 08/03/2022    Plan: 1.  Stage IV rectal adenocarcinoma, BRAF/RAS wild-type, MSI-stable: - She reports tired after SRS on 08/31/2022. - Reviewed labs today: Normal LFTs except elevated total bilirubin.  White count and platelet count is mildly low and stable.  CEA has improved to 7.1.  UA was negative for protein. - I have recommended adding oxaliplatin today.  She will proceed with cycle 4 with oxaliplatin.  RTC 2  weeks for follow-up.   2.  Hyperbilirubinemia: - She has Gilbert's syndrome.  Total bilirubin is 3.0.   3.  Normocytic anemia: - Hemoglobin is 10.1.  Will check ferritin and iron panel.   4.  Leukopenia and thrombocytopenia: - Intermittent leukopenia and thrombocytopenia from splenomegaly.   5.  Brain metastasis: - She had SRS of 3 mm nodular enhancing lesion in the anterior right frontal lobe. - She will have repeat MRI in September.    Orders Placed This Encounter  Procedures   Ferritin    Standing Status:   Future    Number of Occurrences:   1    Standing Expiration Date:   09/07/2023   Iron and TIBC ([facility] ONLY)    Standing Status:   Future    Number of Occurrences:   1    Standing Expiration Date:   09/07/2023      I,Katie Daubenspeck,acting as a scribe for [name], MD.,have documented all relevant documentation on the behalf of [name], MD,as directed by  [name], MD while in the presence of [name], MD.   I, [name] MD, have reviewed the above documentation for accuracy and completeness, and I agree with the above.   [name], MD   7/10/20246:48 PM  CHIEF COMPLAINT:   Diagnosis: metastatic rectal cancer to liver    Cancer Staging  Malignant neoplasm of rectum [facility]) Staging form: Colon and Rectum, AJCC 8th Edition - Clinical stage from 03/13/2018: Stage IVA (cT3, cN1b, cM1a) - Signed by [name], MD on 03/13/2018    Prior Therapy: 1. FOLFOX & vectibix x 7 cycles from 03/14/2018 to 06/27/2018. 2. XRT with Xeloda from 07/30/2018 to 09/06/2018. 3. Right liver lesion microwave  ablation on 10/15/2018. 4. Low anterior resection and diverting ileostomy on 12/07/2018. 5. Right hepatic lobe microwave ablation on 08/15/19 6. SBRT to LLL and liver 11/15/2019 -12/13/2019. 7. SBRT to RUL 08/06/20 - 08/13/20 8. IMRT to 3 left lung lesions and subcarinal LN 09/21/21 -  10/05/21 9. XRT to C6 07/14/22 - 08/03/22  Current Therapy:  FOLFOX and bevacizumab    HISTORY OF PRESENT ILLNESS:   Oncology History  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HCC)  02/26/2018 Initial Diagnosis   Rectal cancer (HCC)   03/13/2018 Cancer Staging   Staging form: Colon and Rectum, AJCC 8th Edition - Clinical stage from 03/13/2018: Stage IVA (cT3, cN1b, cM1a) - Signed by [name], MD on 03/13/2018   03/14/2018 - 06/29/2018 Chemotherapy   The patient had palonosetron (ALOXI) injection 0.25 mg, 0.25 mg, Intravenous,  Once, 7 of 8 cycles Administration: 0.25 mg (03/14/2018), 0.25 mg (03/27/2018), 0.25 mg (04/18/2018), 0.25 mg (05/02/2018), 0.25 mg (05/16/2018), 0.25 mg (06/05/2018), 0.25 mg (06/27/2018) leucovorin 800 mg in dextrose 5 % 250 mL infusion, 772 mg, Intravenous,  Once, 7 of 8 cycles Administration: 800 mg (03/14/2018), 800 mg (03/27/2018), 800 mg (05/02/2018), 800 mg (05/16/2018), 700 mg (04/18/2018), 800 mg (06/05/2018), 800 mg (06/27/2018) oxaliplatin (ELOXATIN) 165 mg in dextrose 5 % 500 mL chemo infusion, 85 mg/m2 = 165 mg, Intravenous,  Once, 6 of 7 cycles Dose modification: 68 mg/m2 (80 % of original dose 85 mg/m2, Cycle 4, Reason: Provider Judgment) Administration: 165 mg (03/14/2018), 165 mg (03/27/2018), 130 mg (05/02/2018), 130 mg (05/16/2018), 130 mg (06/05/2018), 130 mg (06/27/2018) panitumumab (VECTIBIX) 500 mg in sodium chloride 0.9 % 100 mL chemo infusion, 480 mg, Intravenous,  Once, 4 of 5 cycles Administration: 500 mg (04/18/2018), 480 mg (05/02/2018), 480 mg (05/16/2018), 480 mg (06/05/2018) fluorouracil (ADRUCIL) chemo injection 750 mg, 400 mg/m2 = 750 mg (100 % of original dose 400 mg/m2), Intravenous,  Once, 6 of 7 cycles Dose modification: 400 mg/m2 (original dose 400 mg/m2, Cycle 1), 400 mg/m2 (original dose 400 mg/m2, Cycle 3) Administration: 750 mg (03/14/2018), 750 mg (04/18/2018), 750 mg (05/02/2018), 750 mg (05/16/2018), 750 mg (06/05/2018), 750 mg (06/27/2018) fosaprepitant  (EMEND) 150 mg, dexamethasone (DECADRON) 12 mg in sodium chloride 0.9 % 145 mL IVPB, , Intravenous,  Once, 4 of 5 cycles Administration:  (05/02/2018),  (05/16/2018),  (06/05/2018),  (06/27/2018) fluorouracil (ADRUCIL) 4,650 mg in sodium chloride 0.9 % 57 mL chemo infusion, 2,400 mg/m2 = 4,650 mg, Intravenous, 1 Day/Dose, 7 of 8 cycles Administration: 4,650 mg (03/14/2018), 4,650 mg (03/27/2018), 4,650 mg (04/18/2018), 4,650 mg (05/02/2018), 4,650 mg (05/16/2018), 4,650 mg (06/05/2018), 4,650 mg (06/27/2018)  for chemotherapy treatment.    07/06/2022 -  Chemotherapy   Patient is on Treatment Plan : COLORECTAL FOLFOX + Bevacizumab q14d        INTERVAL HISTORY:   Samantha Reynolds is a 57 y.o. female presenting to clinic today for follow up of metastatic rectal cancer to liver. She was last seen by me on 08/24/22.  Since her last visit, she completed a single fraction of SRS to the brain metastasis on 08/31/22 under Dr. Mitzi Hansen.  Today, she states that she is doing well overall. Her appetite level is at 70%. Her energy level is at 40%.  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   Worse in her 34s when she was ill   H/O Hashimoto thyroiditis    History of chemotherapy    History of radiation therapy    History of thyroid cancer no recurrence   2013--  s/p  left lobe thyroidectomy--  follicular varient papillary / lymphocyctic thyroiditis   Hyperlipidemia 11/09/2015   Hypothyroidism    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HCC) 02/26/2018    Surgical History: Past Surgical History:  Procedure Laterality Date   BIOPSY  02/19/2018   Procedure: BIOPSY;  Surgeon: Malissa Hippo, MD;  [facility];  Service: Endoscopy;;  rectum   COLONOSCOPY N/A 08/20/2015   Procedure: COLONOSCOPY;  Surgeon:  Malissa Hippo, MD;  [facility];  Service: Endoscopy;  Laterality: N/A;  730   COLONOSCOPY WITH PROPOFOL N/A 07/21/2021   Procedure: COLONOSCOPY WITH PROPOFOL;  Surgeon: Malissa Hippo, MD;  [facility];  Service: Endoscopy;  Laterality: N/A;  10:10   D & C HYSTEROOSCOPY W/ THERMACHOICE ENDOMETRIAL ABLATION  08-13-2004   DIVERTING ILEOSTOMY N/A 12/07/2018   Procedure: DIVERTING LOOP ILEOSTOMY;  Surgeon: Romie Levee, MD;  Location: [facility];  Service: General;  Laterality: N/A;   FLEXIBLE SIGMOIDOSCOPY N/A 02/19/2018   Procedure: FLEXIBLE SIGMOIDOSCOPY;  Surgeon: Malissa Hippo, MD;  [facility];  Service: Endoscopy;  Laterality: N/A;   HYSTEROSCOPY WITH D & C N/A 04/30/2015   Procedure: DILATATION AND CURETTAGE / INTENDED HYSTEROSCOPY;  Surgeon: Marcelle Overlie, MD;  Location: [facility] [place];  Service: Gynecology;  Laterality: N/A;   ILEOSTOMY CLOSURE N/A 04/17/2019   Procedure: LOOP ILEOSTOMY REVERSAL;  Surgeon: Romie Levee, MD;  Location: [facility];  Service: General;  Laterality: N/A;   IR US GUIDE BX ASP/DRAIN  03/09/2018   LAPAROSCOPIC CHOLECYSTECTOMY  04/1996   LAPAROSCOPY N/A 12/11/2018   Procedure: LAPAROSCOPY  ILEOSTOMY REVISION AND ABDOMINAL WASHOUT;  Surgeon: Romie Levee, MD;  Location: [facility];  Service: General;  Laterality: N/A;   Liver Microwave   10/17/2018   POLYPECTOMY  08/20/2015   Procedure: POLYPECTOMY;  Surgeon: Malissa Hippo, MD;  [facility];  Service: Endoscopy;;  Splenic flexure polypectomy   POLYPECTOMY  02/19/2018   Procedure: POLYPECTOMY;  Surgeon: Malissa Hippo, MD;  [facility];  Service: Endoscopy;;  rectum   PORTACATH PLACEMENT N/A 03/14/2018   Procedure: INSERTION PORT-A-CATH;  Surgeon: Franky Macho, MD;  Location: [facility];  Service: General;  Laterality: N/A;   REDUCTION INCARCERATED UTERUS  06-20-2000   intrauterine preg. 13 wks /  urinary retention   THYROID LOBECTOMY  11/24/2011    Procedure: THYROID LOBECTOMY;  Surgeon: Velora Heckler, MD;  Location: [facility];  Service: General;  Laterality: Left;  Left Thyroid Lobectomy   TUBAL LIGATION  2002   XI ROBOTIC ASSISTED LOWER ANTERIOR RESECTION N/A 12/07/2018   Procedure: XI ROBOTIC ASSISTED LOWER ANTERIOR RESECTION, SPENIC FLEXURE IMMOBILIZATION, COLOANAL ANASTOMOSIS, RIGID PROCTOSCOPY;  Surgeon: Romie Levee, MD;  Location: [facility];  Service: General;  Laterality: N/A;    Social History: Social History   Socioeconomic History   Marital status: Married    Spouse name: Not on file   Number of children: 4   Years of education: Not on file   Highest education level: Not on file  Occupational History    Comment: Systems developer at [employer]  Tobacco Use   Smoking status: Former    Packs/day: 0.25    Years: 10.00  Additional pack years: 0.00    Total pack years: 2.50    Types: Cigarettes    Quit date: 10/31/1991    Years since quitting: 30.9   Smokeless tobacco: Never  Vaping Use   Vaping Use: Never used  Substance and Sexual Activity   Alcohol use: No   Drug use: No   Sexual activity: Not Currently  Other Topics Concern   Not on file  Social History Narrative   Lives with husband Loraine Leriche and 54 year old son Almeta Monas   Husband is Technical brewer of [employer]   Social Determinants of Health   Financial Resource Strain: Low Risk  (02/26/2018)   Overall Financial Resource Strain (CARDIA)    Difficulty of Paying Living Expenses: Not hard at all  Food Insecurity: No Food Insecurity (02/26/2018)   Hunger Vital Sign    Worried About Running Out of Food in the Last Year: Never true    Ran Out of Food in the Last Year: Never true  Transportation Needs: No Transportation Needs (02/26/2018)   PRAPARE - Administrator, Civil Service (Medical): No    Lack of Transportation (Non-Medical): No  Physical Activity: Inactive (02/26/2018)   Exercise Vital Sign    Days of Exercise per Week: 0 days    Minutes of Exercise  per Session: 0 min  Stress: Stress Concern Present (02/26/2018)   [employer] of Occupational Health - Occupational Stress Questionnaire    Feeling of Stress : To some extent  Social Connections: Socially Integrated (02/26/2018)   Social Connection and Isolation Panel [NHANES]    Frequency of Communication with Friends and Family: More than three times a week    Frequency of Social Gatherings with Friends and Family: Twice a week    Attends Religious Services: More than 4 times per year    Active Member of [employer] or Organizations: Yes    Attends Engineer, structural: More than 4 times per year    Marital Status: Married  Catering manager Violence: Not At Risk (02/26/2018)   Humiliation, Afraid, Rape, and Kick questionnaire    Fear of Current or Ex-Partner: No    Emotionally Abused: No    Physically Abused: No    Sexually Abused: No    Family History: Family History  Problem Relation Age of Onset   Hypertension Mother    Hypertension Father    Prostate cancer Father    Cancer Maternal Aunt        spine/back   Cancer Paternal Grandfather        lung   Healthy Son    Healthy Son    Healthy Son    Healthy Son    Colon cancer Neg Hx     Current Medications:  Current Outpatient Medications:    ALPRAZolam (XANAX) 0.5 MG tablet, Take 1 tablet (0.5 mg total) by mouth 3 (three) times daily as needed for anxiety., Disp: 90 tablet, Rfl: 5   amitriptyline (ELAVIL) 10 MG tablet, Take 1 tablet (10 mg total) by mouth 2 (two) times daily., Disp: 180 tablet, Rfl: 3   ARMOUR THYROID 60 MG tablet, Take 1 tablet (60 mg total) by mouth daily., Disp: 90 tablet, Rfl: 1   ARMOUR THYROID 60 MG tablet, Take 1 tablet (60 mg total) by mouth daily., Disp: 90 tablet, Rfl: 1   dexamethasone (DECADRON) 2 MG tablet, Take 1 tablet (2 mg total) by mouth 2 (two) times daily., Disp: 60 tablet, Rfl: 1  loperamide (IMODIUM) 2 MG capsule, Take 1 capsule (2 mg total) by mouth 3 (three) times daily.  (Patient taking differently: Take 2 mg by mouth daily.), Disp: 30 capsule, Rfl: 0   pregabalin (LYRICA) 25 MG capsule, Take 1 capsule (25 mg total) by mouth 2 (two) times daily., Disp: 60 capsule, Rfl: 3   prochlorperazine (COMPAZINE) 10 MG tablet, Take 1 tablet (10 mg total) by mouth every 6 (six) hours as needed for nausea or vomiting., Disp: 30 tablet, Rfl: 4   sucralfate (CARAFATE) 1 g tablet, Take 1 tablet (1 g total) by mouth 4 (four) times daily -  with meals and at bedtime. Crush 1 tablet in 1 oz water and drink 5 min before meals for radiation induced esophagitis, Disp: 120 tablet, Rfl: 2   tiZANidine (ZANAFLEX) 4 MG tablet, Take 1 tablet (4 mg total) by mouth every 6 (six) hours as needed for muscle spasms (take for tightness and pain in the scapular region)., Disp: 56 tablet, Rfl: 2 No current facility-administered medications for this visit.  Facility-Administered Medications Ordered in Other Visits:    clindamycin (CLEOCIN) 900 mg in dextrose 5 % 50 mL IVPB, 900 mg, Intravenous, 60 min Pre-Op **AND** gentamicin (GARAMYCIN) 350 mg in dextrose 5 % 50 mL IVPB, 5 mg/kg, Intravenous, 60 min Pre-Op, Romie Levee, MD   clindamycin (CLEOCIN) 900 mg in dextrose 5 % 50 mL IVPB, 900 mg, Intravenous, 60 min Pre-Op **AND** gentamicin (GARAMYCIN) 5 mg/kg in dextrose 5 % 50 mL IVPB, 5 mg/kg, Intravenous, 60 min Pre-Op, Romie Levee, MD   sodium chloride 0.9 % 1,000 mL with potassium chloride 20 mEq, magnesium sulfate 2 g infusion, , Intravenous, Once, [name], MD   Allergies: Allergies  Allergen Reactions   Oxaliplatin Other (See Comments)    Patient had hypersensitivity reaction to Oxaliplatin. Patient c/o itching in her ears and eyes. Pt also c/o being hot, flushed, dizzy with redness on pt's face, arms, and legs. Pt c/o tingling in her hands and legs. Pt also had a dry throat with coughing. See progress note from 09/07/22 at 1130. Patient was not able to complete Oxaliplatin  infusion.    Demerol [Meperidine] Itching    All over the body.   Penicillins Hives     childhood, does not remember if it spread all over the body or not. Did it involve swelling of the face/tongue/throat, SOB, or low BP? No Did it involve sudden or severe rash/hives, skin peeling, or any reaction on the inside of your mouth or nose? Yes Did you need to seek medical attention at a hospital or doctor's office? Yes When did it last happen?      childhood allergy If all above answers are "NO", may proceed with cephalosporin use.    Levaquin [Levofloxacin] Other (See Comments)    Patient has Aortic Aneurysm and is not indicated with this diagnosis   Other Hives and Other (See Comments)    Fresh coconut    Vancomycin Rash    Will need Benadryl prior to administration per IV. "Red man syndrome"    REVIEW OF SYSTEMS:   Review of Systems  Constitutional:  Negative for chills, fatigue and fever.  HENT:   Positive for trouble swallowing. Negative for lump/mass, mouth sores, nosebleeds and sore throat.   Eyes:  Negative for eye problems.  Respiratory:  Negative for cough and shortness of breath.   Cardiovascular:  Negative for chest pain, leg swelling and palpitations.  Gastrointestinal:  Negative for abdominal pain,  constipation, diarrhea, nausea and vomiting.  Genitourinary:  Negative for bladder incontinence, difficulty urinating, dysuria, frequency, hematuria and nocturia.   Musculoskeletal:  Negative for arthralgias, back pain, flank pain, myalgias and neck pain.  Skin:  Negative for itching and rash.  Neurological:  Positive for numbness. Negative for dizziness and headaches.  Hematological:  Does not bruise/bleed easily.  Psychiatric/Behavioral:  Negative for depression, sleep disturbance and suicidal ideas. The patient is not nervous/anxious.   All other systems reviewed and are negative.    VITALS:   Last menstrual period 02/16/2015.  Wt Readings from Last 3 Encounters:   10/05/22 136 lb (61.7 kg)  09/21/22 137 lb 9.6 oz (62.4 kg)  09/13/22 136 lb 8 oz (61.9 kg)    There is no height or weight on file to calculate BMI.  Performance status (ECOG): 0 - Asymptomatic  PHYSICAL EXAM:   Physical Exam Vitals and nursing note reviewed. Exam conducted with a chaperone present.  Constitutional:      Appearance: Normal appearance.  Cardiovascular:     Rate and Rhythm: Normal rate and regular rhythm.     Pulses: Normal pulses.     Heart sounds: Normal heart sounds.  Pulmonary:     Effort: Pulmonary effort is normal.     Breath sounds: Normal breath sounds.  Abdominal:     Palpations: Abdomen is soft. There is no hepatomegaly, splenomegaly or mass.     Tenderness: There is no abdominal tenderness.  Musculoskeletal:     Right lower leg: No edema.     Left lower leg: No edema.  Lymphadenopathy:     Cervical: No cervical adenopathy.     Right cervical: No superficial, deep or posterior cervical adenopathy.    Left cervical: No superficial, deep or posterior cervical adenopathy.     Upper Body:     Right upper body: No supraclavicular or axillary adenopathy.     Left upper body: No supraclavicular or axillary adenopathy.  Neurological:     General: No focal deficit present.     Mental Status: She is alert and oriented to person, place, and time.  Psychiatric:        Mood and Affect: Mood normal.        Behavior: Behavior normal.     LABS:      Latest Ref Rng & Units 10/05/2022    8:05 AM 09/21/2022    7:54 AM 09/07/2022    8:36 AM  CBC  WBC 4.0 - 10.5 K/uL 3.0  2.5  2.7   Hemoglobin 12.0 - 15.0 g/dL 16.1  09.6  04.5   Hematocrit 36.0 - 46.0 % 31.7  32.5  29.5   Platelets 150 - 400 K/uL 61  106  106       Latest Ref Rng & Units 10/05/2022    8:05 AM 09/21/2022    7:54 AM 09/07/2022    8:36 AM  CMP  Glucose 70 - 99 mg/dL 87  99  89   BUN 6 - 20 mg/dL 12  12  11    Creatinine 0.44 - 1.00 mg/dL 4.09  8.11  9.14   Sodium 135 - 145 mmol/L 135  135   135   Potassium 3.5 - 5.1 mmol/L 3.5  3.4  3.6   Chloride 98 - 111 mmol/L 104  104  104   CO2 22 - 32 mmol/L 23  22  23    Calcium 8.9 - 10.3 mg/dL 8.6  8.7  8.5   Total Protein  6.5 - 8.1 g/dL 6.7  7.0  6.5   Total Bilirubin 0.3 - 1.2 mg/dL 2.8  4.1  3.0   Alkaline Phos 38 - 126 U/L 65  58  61   AST 15 - 41 U/L 20  21  18    ALT 0 - 44 U/L 13  10  10       Lab Results  Component Value Date   CEA1 7.1 (H) 08/24/2022   /  CEA  Date Value Ref Range Status  08/24/2022 7.1 (H) 0.0 - 4.7 ng/mL Final    Comment:    (NOTE)                             Nonsmokers          <3.9                             Smokers             <5.6 Roche [employer]ostics Electrochemiluminescence Immunoassay (ECLIA) Values obtained with different assay methods or kits cannot be used interchangeably.  Results cannot be interpreted as absolute evidence of the presence or absence of malignant disease. Performed At: [facility] [address] [place], [place] [id number] Jolene Schimke MD WU:[id number]    No results found for: "PSA1" No results found for: "CAN199" No results found for: "CAN125"  No results found for: "TOTALPROTELP", "ALBUMINELP", "A1GS", "A2GS", "BETS", "BETA2SER", "GAMS", "MSPIKE", "SPEI" Lab Results  Component Value Date   TIBC 333 09/07/2022   TIBC 364 05/11/2022   TIBC 385 11/09/2020   FERRITIN 89 09/07/2022   FERRITIN 61 05/11/2022   FERRITIN 54 11/09/2020   IRONPCTSAT 36 (H) 09/07/2022   IRONPCTSAT 26 05/11/2022   IRONPCTSAT 22 11/09/2020   Lab Results  Component Value Date   LDH 154 01/20/2020     STUDIES:   No results found.

## 2022-09-07 ENCOUNTER — Inpatient Hospital Stay: Payer: 59

## 2022-09-07 ENCOUNTER — Inpatient Hospital Stay (HOSPITAL_BASED_OUTPATIENT_CLINIC_OR_DEPARTMENT_OTHER): Payer: 59 | Admitting: Hematology

## 2022-09-07 ENCOUNTER — Inpatient Hospital Stay: Payer: 59 | Attending: Hematology

## 2022-09-07 VITALS — BP 115/73 | HR 103 | Temp 98.2°F | Resp 20 | Wt 138.0 lb

## 2022-09-07 VITALS — BP 107/68 | HR 91 | Temp 98.5°F | Resp 18

## 2022-09-07 DIAGNOSIS — R200 Anesthesia of skin: Secondary | ICD-10-CM | POA: Diagnosis not present

## 2022-09-07 DIAGNOSIS — Z9049 Acquired absence of other specified parts of digestive tract: Secondary | ICD-10-CM | POA: Diagnosis not present

## 2022-09-07 DIAGNOSIS — D649 Anemia, unspecified: Secondary | ICD-10-CM

## 2022-09-07 DIAGNOSIS — C7931 Secondary malignant neoplasm of brain: Secondary | ICD-10-CM | POA: Diagnosis not present

## 2022-09-07 DIAGNOSIS — D72819 Decreased white blood cell count, unspecified: Secondary | ICD-10-CM | POA: Insufficient documentation

## 2022-09-07 DIAGNOSIS — Z5112 Encounter for antineoplastic immunotherapy: Secondary | ICD-10-CM | POA: Insufficient documentation

## 2022-09-07 DIAGNOSIS — C20 Malignant neoplasm of rectum: Secondary | ICD-10-CM | POA: Diagnosis not present

## 2022-09-07 DIAGNOSIS — Z885 Allergy status to narcotic agent status: Secondary | ICD-10-CM | POA: Diagnosis not present

## 2022-09-07 DIAGNOSIS — D696 Thrombocytopenia, unspecified: Secondary | ICD-10-CM | POA: Diagnosis not present

## 2022-09-07 DIAGNOSIS — F419 Anxiety disorder, unspecified: Secondary | ICD-10-CM | POA: Diagnosis not present

## 2022-09-07 DIAGNOSIS — Z9221 Personal history of antineoplastic chemotherapy: Secondary | ICD-10-CM | POA: Diagnosis not present

## 2022-09-07 DIAGNOSIS — Z79899 Other long term (current) drug therapy: Secondary | ICD-10-CM | POA: Insufficient documentation

## 2022-09-07 DIAGNOSIS — C787 Secondary malignant neoplasm of liver and intrahepatic bile duct: Secondary | ICD-10-CM | POA: Diagnosis not present

## 2022-09-07 DIAGNOSIS — Z79631 Long term (current) use of antimetabolite agent: Secondary | ICD-10-CM | POA: Diagnosis not present

## 2022-09-07 DIAGNOSIS — Z95828 Presence of other vascular implants and grafts: Secondary | ICD-10-CM

## 2022-09-07 DIAGNOSIS — Z5111 Encounter for antineoplastic chemotherapy: Secondary | ICD-10-CM | POA: Insufficient documentation

## 2022-09-07 DIAGNOSIS — Z923 Personal history of irradiation: Secondary | ICD-10-CM | POA: Insufficient documentation

## 2022-09-07 DIAGNOSIS — Z87891 Personal history of nicotine dependence: Secondary | ICD-10-CM | POA: Insufficient documentation

## 2022-09-07 DIAGNOSIS — Z79890 Hormone replacement therapy (postmenopausal): Secondary | ICD-10-CM | POA: Insufficient documentation

## 2022-09-07 DIAGNOSIS — Z881 Allergy status to other antibiotic agents status: Secondary | ICD-10-CM | POA: Insufficient documentation

## 2022-09-07 DIAGNOSIS — E876 Hypokalemia: Secondary | ICD-10-CM

## 2022-09-07 DIAGNOSIS — R161 Splenomegaly, not elsewhere classified: Secondary | ICD-10-CM | POA: Insufficient documentation

## 2022-09-07 DIAGNOSIS — Z79630 Long term (current) use of alkylating agent: Secondary | ICD-10-CM | POA: Diagnosis not present

## 2022-09-07 LAB — CBC WITH DIFFERENTIAL/PLATELET
Abs Immature Granulocytes: 0.01 10*3/uL (ref 0.00–0.07)
Basophils Absolute: 0 10*3/uL (ref 0.0–0.1)
Basophils Relative: 0 %
Eosinophils Absolute: 0 10*3/uL (ref 0.0–0.5)
Eosinophils Relative: 0 %
HCT: 29.5 % — ABNORMAL LOW (ref 36.0–46.0)
Hemoglobin: 10.1 g/dL — ABNORMAL LOW (ref 12.0–15.0)
Immature Granulocytes: 0 %
Lymphocytes Relative: 20 %
Lymphs Abs: 0.5 10*3/uL — ABNORMAL LOW (ref 0.7–4.0)
MCH: 36.5 pg — ABNORMAL HIGH (ref 26.0–34.0)
MCHC: 34.2 g/dL (ref 30.0–36.0)
MCV: 106.5 fL — ABNORMAL HIGH (ref 80.0–100.0)
Monocytes Absolute: 0.2 10*3/uL (ref 0.1–1.0)
Monocytes Relative: 7 %
Neutro Abs: 1.9 10*3/uL (ref 1.7–7.7)
Neutrophils Relative %: 73 %
Platelets: 106 10*3/uL — ABNORMAL LOW (ref 150–400)
RBC: 2.77 MIL/uL — ABNORMAL LOW (ref 3.87–5.11)
RDW: 16.7 % — ABNORMAL HIGH (ref 11.5–15.5)
WBC: 2.7 10*3/uL — ABNORMAL LOW (ref 4.0–10.5)
nRBC: 0 % (ref 0.0–0.2)

## 2022-09-07 LAB — FERRITIN: Ferritin: 89 ng/mL (ref 11–307)

## 2022-09-07 LAB — COMPREHENSIVE METABOLIC PANEL
ALT: 10 U/L (ref 0–44)
AST: 18 U/L (ref 15–41)
Albumin: 3.6 g/dL (ref 3.5–5.0)
Alkaline Phosphatase: 61 U/L (ref 38–126)
Anion gap: 8 (ref 5–15)
BUN: 11 mg/dL (ref 6–20)
CO2: 23 mmol/L (ref 22–32)
Calcium: 8.5 mg/dL — ABNORMAL LOW (ref 8.9–10.3)
Chloride: 104 mmol/L (ref 98–111)
Creatinine, Ser: 0.84 mg/dL (ref 0.44–1.00)
GFR, Estimated: >60 mL/min (ref >60)
Glucose, Bld: 89 mg/dL (ref 70–99)
Potassium: 3.6 mmol/L (ref 3.5–5.1)
Sodium: 135 mmol/L (ref 135–145)
Total Bilirubin: 3 mg/dL — ABNORMAL HIGH (ref 0.3–1.2)
Total Protein: 6.5 g/dL (ref 6.5–8.1)

## 2022-09-07 LAB — IRON AND TIBC
Iron: 119 ug/dL (ref 28–170)
Saturation Ratios: 36 % — ABNORMAL HIGH (ref 10.4–31.8)
TIBC: 333 ug/dL (ref 250–450)
UIBC: 214 ug/dL

## 2022-09-07 LAB — MAGNESIUM: Magnesium: 2.3 mg/dL (ref 1.7–2.4)

## 2022-09-07 MED ORDER — FLUOROURACIL CHEMO INJECTION 500 MG/10ML
320.0000 mg/m2 | Freq: Once | INTRAVENOUS | Status: AC
  Administered 2022-09-07: 500 mg via INTRAVENOUS
  Filled 2022-09-07: qty 10

## 2022-09-07 MED ORDER — SODIUM CHLORIDE 0.9 % IV SOLN
1920.0000 mg/m2 | INTRAVENOUS | Status: DC
  Administered 2022-09-07: 3500 mg via INTRAVENOUS
  Filled 2022-09-07: qty 70

## 2022-09-07 MED ORDER — PALONOSETRON HCL INJECTION 0.25 MG/5ML
0.2500 mg | Freq: Once | INTRAVENOUS | Status: AC
  Administered 2022-09-07: 0.25 mg via INTRAVENOUS
  Filled 2022-09-07: qty 5

## 2022-09-07 MED ORDER — DIPHENHYDRAMINE HCL 50 MG/ML IJ SOLN
25.0000 mg | Freq: Once | INTRAMUSCULAR | Status: AC
  Administered 2022-09-07: 25 mg via INTRAVENOUS

## 2022-09-07 MED ORDER — METHYLPREDNISOLONE SODIUM SUCC 125 MG IJ SOLR
125.0000 mg | Freq: Once | INTRAMUSCULAR | Status: AC | PRN
  Administered 2022-09-07: 125 mg via INTRAVENOUS

## 2022-09-07 MED ORDER — SODIUM CHLORIDE 0.9 % IV SOLN
5.0000 mg/kg | Freq: Once | INTRAVENOUS | Status: AC
  Administered 2022-09-07: 300 mg via INTRAVENOUS
  Filled 2022-09-07: qty 12

## 2022-09-07 MED ORDER — FAMOTIDINE IN NACL 20-0.9 MG/50ML-% IV SOLN
20.0000 mg | Freq: Once | INTRAVENOUS | Status: AC | PRN
  Administered 2022-09-07: 20 mg via INTRAVENOUS

## 2022-09-07 MED ORDER — DEXTROSE 5 % IV SOLN: Freq: Once | INTRAVENOUS | Status: AC

## 2022-09-07 MED ORDER — SODIUM CHLORIDE 0.9 % IV SOLN
10.0000 mg | Freq: Once | INTRAVENOUS | Status: AC
  Administered 2022-09-07: 10 mg via INTRAVENOUS
  Filled 2022-09-07: qty 10

## 2022-09-07 MED ORDER — SODIUM CHLORIDE 0.9 % IV SOLN: Freq: Once | INTRAVENOUS | Status: AC

## 2022-09-07 MED ORDER — SODIUM CHLORIDE 0.9% FLUSH
10.0000 mL | Freq: Once | INTRAVENOUS | Status: AC
  Administered 2022-09-07: 10 mL via INTRAVENOUS

## 2022-09-07 MED ORDER — LEUCOVORIN CALCIUM INJECTION 350 MG
320.0000 mg/m2 | Freq: Once | INTRAVENOUS | Status: AC
  Administered 2022-09-07: 540 mg via INTRAVENOUS
  Filled 2022-09-07: qty 27

## 2022-09-07 MED ORDER — OXALIPLATIN CHEMO INJECTION 100 MG/20ML
68.0000 mg/m2 | Freq: Once | INTRAVENOUS | Status: AC
  Administered 2022-09-07: 115 mg via INTRAVENOUS
  Filled 2022-09-07: qty 20

## 2022-09-07 NOTE — Progress Notes (Signed)
Patient has been assessed, vital signs and labs have been reviewed by Dr. Katragadda. ANC, Creatinine, LFTs, and Platelets are within treatment parameters per Dr. Katragadda. The patient is good to proceed with treatment at this time. Primary RN and pharmacy aware.  

## 2022-09-07 NOTE — Patient Instructions (Signed)
[facility]  Discharge Instructions: Thank you for choosing [facility] to provide your oncology and hematology care.  If you have a lab appointment with the Cancer Center - please note that after April 8th, 2024, all labs will be drawn in the cancer center.  You do not have to check in or register with the main entrance as you have in the past but will complete your check-in in the cancer center.  Wear comfortable clothing and clothing appropriate for easy access to any Portacath or PICC line.   We strive to give you quality time with your provider. You may need to reschedule your appointment if you arrive late (15 or more minutes).  Arriving late affects you and other patients whose appointments are after yours.  Also, if you miss three or more appointments without notifying the office, you may be dismissed from the clinic at the provider's discretion.      For prescription refill requests, have your pharmacy contact our office and allow 72 hours for refills to be completed.    Today you received the following chemotherapy and/or immunotherapy agents MVASI/Leucovorin/5FU   To help prevent nausea and vomiting after your treatment, we encourage you to take your nausea medication as directed.  Bevacizumab Injection What is this medication? BEVACIZUMAB (be va SIZ yoo mab) treats some types of cancer. It works by blocking a protein that causes cancer cells to grow and multiply. This helps to slow or stop the spread of cancer cells. It is a monoclonal antibody. This medicine may be used for other purposes; ask your health care provider or pharmacist if you have questions. COMMON BRAND NAME(S): Alymsys, Avastin, MVASI, Omer Jack What should I tell my care team before I take this medication? They need to know if you have any of these conditions: Blood clots Coughing up blood Having or recent surgery Heart failure High blood pressure History of a connection  between 2 or more body parts that do not usually connect (fistula) History of a tear in your stomach or intestines Protein in your urine An unusual or allergic reaction to bevacizumab, other medications, foods, dyes, or preservatives Pregnant or trying to get pregnant Breast-feeding How should I use this medication? This medication is injected into a vein. It is given by your care team in a hospital or clinic setting. Talk to your care team the use of this medication in children. Special care may be needed. Overdosage: If you think you have taken too much of this medicine contact a poison control center or emergency room at once. NOTE: This medicine is only for you. Do not share this medicine with others. What if I miss a dose? Keep appointments for follow-up doses. It is important not to miss your dose. Call your care team if you are unable to keep an appointment. What may interact with this medication? Interactions are not expected. This list may not describe all possible interactions. Give your health care provider a list of all the medicines, herbs, non-prescription drugs, or dietary supplements you use. Also tell them if you smoke, drink alcohol, or use illegal drugs. Some items may interact with your medicine. What should I watch for while using this medication? Your condition will be monitored carefully while you are receiving this medication. You may need blood work while taking this medication. This medication may make you feel generally unwell. This is not uncommon as chemotherapy can affect healthy cells as well as cancer cells. Report any  side effects. Continue your course of treatment even though you feel ill unless your care team tells you to stop. This medication may increase your risk to bruise or bleed. Call your care team if you notice any unusual bleeding. Before having surgery, talk to your care team to make sure it is ok. This medication can increase the risk of poor healing  of your surgical site or wound. You will need to stop this medication for 28 days before surgery. After surgery, wait at least 28 days before restarting this medication. Make sure the surgical site or wound is healed enough before restarting this medication. Talk to your care team if questions. Talk to your care team if you may be pregnant. Serious birth defects can occur if you take this medication during pregnancy and for 6 months after the last dose. Contraception is recommended while taking this medication and for 6 months after the last dose. Your care team can help you find the option that works for you. Do not breastfeed while taking this medication and for 6 months after the last dose. This medication can cause infertility. Talk to your care team if you are concerned about your fertility. What side effects may I notice from receiving this medication? Side effects that you should report to your care team as soon as possible: Allergic reactions--skin rash, itching, hives, swelling of the face, lips, tongue, or throat Bleeding--bloody or black, tar-like stools, vomiting blood or brown material that looks like coffee grounds, red or dark brown urine, small red or purple spots on skin, unusual bruising or bleeding Blood clot--pain, swelling, or warmth in the leg, shortness of breath, chest pain Heart attack--pain or tightness in the chest, shoulders, arms, or jaw, nausea, shortness of breath, cold or clammy skin, feeling faint or lightheaded Heart failure--shortness of breath, swelling of the ankles, feet, or hands, sudden weight gain, unusual weakness or fatigue Increase in blood pressure Infection--fever, chills, cough, sore throat, wounds that don't heal, pain or trouble when passing urine, general feeling of discomfort or being unwell Infusion reactions--chest pain, shortness of breath or trouble breathing, feeling faint or lightheaded Kidney injury--decrease in the amount of urine, swelling of  the ankles, hands, or feet Stomach pain that is severe, does not go away, or gets worse Stroke--sudden numbness or weakness of the face, arm, or leg, trouble speaking, confusion, trouble walking, loss of balance or coordination, dizziness, severe headache, change in vision Sudden and severe headache, confusion, change in vision, seizures, which may be signs of posterior reversible encephalopathy syndrome (PRES) Side effects that usually do not require medical attention (report to your care team if they continue or are bothersome): Back pain Change in taste Diarrhea Dry skin Increased tears Nosebleed This list may not describe all possible side effects. Call your doctor for medical advice about side effects. You may report side effects to FDA at 1-800-FDA-1088. Where should I keep my medication? This medication is given in a hospital or clinic. It will not be stored at home. NOTE: This sheet is a summary. It may not cover all possible information. If you have questions about this medicine, talk to your doctor, pharmacist, or health care provider.  2024 Elsevier/Gold Standard (2021-07-30 00:00:00)  Leucovorin Injection What is this medication? LEUCOVORIN (loo koe VOR in) prevents side effects from certain medications, such as methotrexate. It works by increasing folate levels. This helps protect healthy cells in your body. It may also be used to treat anemia caused by low levels of  folate. It can also be used with fluorouracil, a type of chemotherapy, to treat colorectal cancer. It works by increasing the effects of fluorouracil in the body. This medicine may be used for other purposes; ask your health care provider or pharmacist if you have questions. What should I tell my care team before I take this medication? They need to know if you have any of these conditions: Anemia from low levels of vitamin B12 in the blood An unusual or allergic reaction to leucovorin, folic acid, other  medications, foods, dyes, or preservatives Pregnant or trying to get pregnant Breastfeeding How should I use this medication? This medication is injected into a vein or a muscle. It is given by your care team in a hospital or clinic setting. Talk to your care team about the use of this medication in children. Special care may be needed. Overdosage: If you think you have taken too much of this medicine contact a poison control center or emergency room at once. NOTE: This medicine is only for you. Do not share this medicine with others. What if I miss a dose? Keep appointments for follow-up doses. It is important not to miss your dose. Call your care team if you are unable to keep an appointment. What may interact with this medication? Capecitabine Fluorouracil Phenobarbital Phenytoin Primidone Trimethoprim;sulfamethoxazole This list may not describe all possible interactions. Give your health care provider a list of all the medicines, herbs, non-prescription drugs, or dietary supplements you use. Also tell them if you smoke, drink alcohol, or use illegal drugs. Some items may interact with your medicine. What should I watch for while using this medication? Your condition will be monitored carefully while you are receiving this medication. This medication may increase the side effects of 5-fluorouracil. Tell your care team if you have diarrhea or mouth sores that do not get better or that get worse. What side effects may I notice from receiving this medication? Side effects that you should report to your care team as soon as possible: Allergic reactions--skin rash, itching, hives, swelling of the face, lips, tongue, or throat This list may not describe all possible side effects. Call your doctor for medical advice about side effects. You may report side effects to FDA at 1-800-FDA-1088. Where should I keep my medication? This medication is given in a hospital or clinic. It will not be stored  at home. NOTE: This sheet is a summary. It may not cover all possible information. If you have questions about this medicine, talk to your doctor, pharmacist, or health care provider.  2024 Elsevier/Gold Standard (2021-08-17 00:00:00)  Fluorouracil Injection What is this medication? FLUOROURACIL (flure oh YOOR a sil) treats some types of cancer. It works by slowing down the growth of cancer cells. This medicine may be used for other purposes; ask your health care provider or pharmacist if you have questions. COMMON BRAND NAME(S): Adrucil What should I tell my care team before I take this medication? They need to know if you have any of these conditions: Blood disorders Dihydropyrimidine dehydrogenase (DPD) deficiency Infection, such as chickenpox, cold sores, herpes Kidney disease Liver disease Poor nutrition Recent or ongoing radiation therapy An unusual or allergic reaction to fluorouracil, other medications, foods, dyes, or preservatives If you or your partner are pregnant or trying to get pregnant Breast-feeding How should I use this medication? This medication is injected into a vein. It is administered by your care team in a hospital or clinic setting. Talk to your care  team about the use of this medication in children. Special care may be needed. Overdosage: If you think you have taken too much of this medicine contact a poison control center or emergency room at once. NOTE: This medicine is only for you. Do not share this medicine with others. What if I miss a dose? Keep appointments for follow-up doses. It is important not to miss your dose. Call your care team if you are unable to keep an appointment. What may interact with this medication? Do not take this medication with any of the following: Live virus vaccines This medication may also interact with the following: Medications that treat or prevent blood clots, such as warfarin, enoxaparin, dalteparin This list may not  describe all possible interactions. Give your health care provider a list of all the medicines, herbs, non-prescription drugs, or dietary supplements you use. Also tell them if you smoke, drink alcohol, or use illegal drugs. Some items may interact with your medicine. What should I watch for while using this medication? Your condition will be monitored carefully while you are receiving this medication. This medication may make you feel generally unwell. This is not uncommon as chemotherapy can affect healthy cells as well as cancer cells. Report any side effects. Continue your course of treatment even though you feel ill unless your care team tells you to stop. In some cases, you may be given additional medications to help with side effects. Follow all directions for their use. This medication may increase your risk of getting an infection. Call your care team for advice if you get a fever, chills, sore throat, or other symptoms of a cold or flu. Do not treat yourself. Try to avoid being around people who are sick. This medication may increase your risk to bruise or bleed. Call your care team if you notice any unusual bleeding. Be careful brushing or flossing your teeth or using a toothpick because you may get an infection or bleed more easily. If you have any dental work done, tell your dentist you are receiving this medication. Avoid taking medications that contain aspirin, acetaminophen, ibuprofen, naproxen, or ketoprofen unless instructed by your care team. These medications may hide a fever. Do not treat diarrhea with over the counter products. Contact your care team if you have diarrhea that lasts more than 2 days or if it is severe and watery. This medication can make you more sensitive to the sun. Keep out of the sun. If you cannot avoid being in the sun, wear protective clothing and sunscreen. Do not use sun lamps, tanning beds, or tanning booths. Talk to your care team if you or your partner  wish to become pregnant or think you might be pregnant. This medication can cause serious birth defects if taken during pregnancy and for 3 months after the last dose. A reliable form of contraception is recommended while taking this medication and for 3 months after the last dose. Talk to your care team about effective forms of contraception. Do not father a child while taking this medication and for 3 months after the last dose. Use a condom while having sex during this time period. Do not breastfeed while taking this medication. This medication may cause infertility. Talk to your care team if you are concerned about your fertility. What side effects may I notice from receiving this medication? Side effects that you should report to your care team as soon as possible: Allergic reactions--skin rash, itching, hives, swelling of the face, lips, tongue,  or throat Heart attack--pain or tightness in the chest, shoulders, arms, or jaw, nausea, shortness of breath, cold or clammy skin, feeling faint or lightheaded Heart failure--shortness of breath, swelling of the ankles, feet, or hands, sudden weight gain, unusual weakness or fatigue Heart rhythm changes--fast or irregular heartbeat, dizziness, feeling faint or lightheaded, chest pain, trouble breathing High ammonia level--unusual weakness or fatigue, confusion, loss of appetite, nausea, vomiting, seizures Infection--fever, chills, cough, sore throat, wounds that don't heal, pain or trouble when passing urine, general feeling of discomfort or being unwell Low red blood cell level--unusual weakness or fatigue, dizziness, headache, trouble breathing Pain, tingling, or numbness in the hands or feet, muscle weakness, change in vision, confusion or trouble speaking, loss of balance or coordination, trouble walking, seizures Redness, swelling, and blistering of the skin over hands and feet Severe or prolonged diarrhea Unusual bruising or bleeding Side effects  that usually do not require medical attention (report to your care team if they continue or are bothersome): Dry skin Headache Increased tears Nausea Pain, redness, or swelling with sores inside the mouth or throat Sensitivity to light Vomiting This list may not describe all possible side effects. Call your doctor for medical advice about side effects. You may report side effects to FDA at 1-800-FDA-1088. Where should I keep my medication? This medication is given in a hospital or clinic. It will not be stored at home. NOTE: This sheet is a summary. It may not cover all possible information. If you have questions about this medicine, talk to your doctor, pharmacist, or health care provider.  2024 Elsevier/Gold Standard (2021-07-20 00:00:00)     BELOW ARE SYMPTOMS THAT SHOULD BE REPORTED IMMEDIATELY: *FEVER GREATER THAN 100.4 F (38 C) OR HIGHER *CHILLS OR SWEATING *NAUSEA AND VOMITING THAT IS NOT CONTROLLED WITH YOUR NAUSEA MEDICATION *UNUSUAL SHORTNESS OF BREATH *UNUSUAL BRUISING OR BLEEDING *URINARY PROBLEMS (pain or burning when urinating, or frequent urination) *BOWEL PROBLEMS (unusual diarrhea, constipation, pain near the anus) TENDERNESS IN MOUTH AND THROAT WITH OR WITHOUT PRESENCE OF ULCERS (sore throat, sores in mouth, or a toothache) UNUSUAL RASH, SWELLING OR PAIN  UNUSUAL VAGINAL DISCHARGE OR ITCHING   Items with * indicate a potential emergency and should be followed up as soon as possible or go to the Emergency Department if any problems should occur.  Please show the CHEMOTHERAPY ALERT CARD or IMMUNOTHERAPY ALERT CARD at check-in to the Emergency Department and triage nurse.  Should you have questions after your visit or need to cancel or reschedule your appointment, please contact [facility] [phone]  and follow the prompts.  Office hours are 8:00 a.m. to 4:30 p.m. Monday - Friday. Please note that voicemails left after 4:00 p.m. may not be  returned until the following business day.  We are closed weekends and major holidays. You have access to a nurse at all times for urgent questions. Please call the main number to the clinic [phone] and follow the prompts.  For any non-urgent questions, you may also contact your provider using MyChart. We now offer e-Visits for anyone 28 and older to request care online for non-urgent symptoms. For details visit [email or web address].   Also download the MyChart app! Go to the app store, search "MyChart", open the app, select [facility], and log in with your MyChart username and password.

## 2022-09-07 NOTE — Progress Notes (Signed)
Hypersensitivity Reaction note  Date of event: 09/07/22 Time of event: 11:24 a.m  Generic name of drug involved: Oxaliplatin Name of provider notified of the hypersensitivity reaction: Vern Claude Katragadda,MD Was agent that likely caused hypersensitivity reaction added to Allergies List within EMR? Yes Chain of events including reaction signs/symptoms, treatment administered, and outcome (e.g., drug resumed; drug discontinued; sent to Emergency Department; etc.)   1124- Pt called out stating she had felt itching in her ears and around her eyes. Pt also c/o being hot, flushed, dizzy with redness on pt's face, arms, and legs. Pt c/o tingling in her hands and legs. Pt also had a dry throat with coughing. Oxaliplatin infusion was stopped, vitals obtained see flowsheet. 924mL/hr normal saline bolus started. 1126- Dr.Katragadda at bedside and stated to give Pepcid 20 mg IV, Solu-medrol 125 mg/2 mL IV, and Benadryl 25 mg IV. Dr.Katragadda stated to hold Oxaliplatin today and proceed to give other chemotherapy medications when patient felt better.  1128- Vitals obtained see flowsheets 1201- Vital signs obtained, after close monitoring pt stated she felt okay to proceed with Leucovorin, Mvasi, and 5FU infusion.  43- Patient stated all her symptom has resolved. Pt discharged home and voiced no complaints.       Virgina Organ, RN 09/07/2022 3:54 PM

## 2022-09-07 NOTE — Progress Notes (Signed)
Patient presents today for MVASI/Folfox infusion. Patient is in satisfactory condition with no new complaints voiced.  Vital signs are stable.  Labs reviewed by Dr. Ellin Saba during the office visit and all labs are within treatment parameters.  We will proceed with treatment per MD orders.   1124- Pt called out stating she had felt itching in her ears and around her eyes. Pt also c/o being hot, flushed, dizzy with redness on pt's face, arms, and legs. Pt c/o tingling in her hands and legs. Pt also had a dry throat with coughing. Oxaliplatin infusion was stopped, vitals obtained see flowsheet. 929mL/hr normal saline bolus started. 1126- Dr.Katragadda at bedside and stated to give Pepcid 20 mg IV, Solu-medrol 125 mg/2 mL IV, and Benadryl 25 mg IV. Dr.Katragadda stated to hold Oxaliplatin today and proceed to give other chemotherapy medications when patient felt better.  1128- Vitals obtained see flowsheets 1201- Vital signs obtained see flowsheets, after close monitoring pt stated she felt okay to proceed with Leucovorin, Mvasi, and 5FU infusion.  1428-Vitals obtained see flowsheets. Patient stated all her symptom has resolved. Pt discharged home and voiced no complaints.   Treatment given today per MD orders. Tolerated other infusions without adverse affects. Vital signs stable. No complaints at this time. Discharged from clinic ambulatory in stable condition. Alert and oriented x 3. F/U with [facility] as scheduled. 5FU ambulatory pump infusing.

## 2022-09-07 NOTE — Patient Instructions (Signed)
[facility]  Discharge Instructions  You were seen and examined today by Dr. Ellin Saba.  Dr. Ellin Saba discussed your most recent lab work which is stable. Proceed with treatment, Dr. Ellin Saba will add back Oxaliplatin today.  Follow-up as scheduled.  Thank you for choosing [facility] to provide your oncology and hematology care.   To afford each patient quality time with our provider, please arrive at least 15 minutes before your scheduled appointment time. You may need to reschedule your appointment if you arrive late (10 or more minutes). Arriving late affects you and other patients whose appointments are after yours.  Also, if you miss three or more appointments without notifying the office, you may be dismissed from the clinic at the provider's discretion.    Again, thank you for choosing [facility].  Our hope is that these requests will decrease the amount of time that you wait before being seen by our physicians.   If you have a lab appointment with the Cancer Center - please note that after April 8th, all labs will be drawn in the cancer center.  You do not have to check in or register with the main entrance as you have in the past but will complete your check-in at the cancer center.            _____________________________________________________________  Should you have questions after your visit to [facility], please contact our office at [phone] and follow the prompts.  Our office hours are 8:00 a.m. to 4:30 p.m. Monday - Thursday and 8:00 a.m. to 2:30 p.m. Friday.  Please note that voicemails left after 4:00 p.m. may not be returned until the following business day.  We are closed weekends and all major holidays.  You do have access to a nurse 24-7, just call the main number to the clinic [phone] and do not press any options, hold on the line and a nurse will answer the phone.    For  prescription refill requests, have your pharmacy contact our office and allow 72 hours.    Masks are no longer required in the cancer centers. If you would like for your care team to wear a mask while they are taking care of you, please let them know. You may have one support person who is at least 59 years old accompany you for your appointments.

## 2022-09-08 ENCOUNTER — Telehealth: Payer: Self-pay | Admitting: *Deleted

## 2022-09-08 NOTE — Telephone Encounter (Signed)
24 hour chemotherapy reaction called placed today. Pt did not answer, VM was left stating to call the clinic if needed.

## 2022-09-09 ENCOUNTER — Inpatient Hospital Stay: Payer: 59

## 2022-09-09 VITALS — BP 97/72 | HR 97 | Temp 98.5°F | Resp 19

## 2022-09-09 DIAGNOSIS — C20 Malignant neoplasm of rectum: Secondary | ICD-10-CM

## 2022-09-09 DIAGNOSIS — Z5112 Encounter for antineoplastic immunotherapy: Secondary | ICD-10-CM | POA: Diagnosis not present

## 2022-09-09 MED ORDER — HEPARIN SOD (PORK) LOCK FLUSH 100 UNIT/ML IV SOLN
500.0000 [IU] | Freq: Once | INTRAVENOUS | Status: AC | PRN
  Administered 2022-09-09: 500 [IU]

## 2022-09-09 MED ORDER — SODIUM CHLORIDE 0.9% FLUSH
10.0000 mL | INTRAVENOUS | Status: DC | PRN
  Administered 2022-09-09: 10 mL

## 2022-09-09 NOTE — Patient Instructions (Signed)
You had your pump d/c'd today.  Follow up as scheduled in 2 weeks.

## 2022-09-09 NOTE — Progress Notes (Signed)
Patient here today for pump d/c per MD orders. She reports overall doing okay but she is extremely exhausted. She said today is worse than others.  She has rested but her fatigue is not relieved.  She continues to work and rests in between when she can.  She reports that she is having some constipation.  Advised and encouraged her to drink more fluids, take stool softeners daily and to let us know if she needs anything else to help her move her bowels. She verbalizes understanding.    Pump was stopped when she arrived. All the volume was administered. She denies any issues with the pump over the last 2 days.  Pump was d/c and port was deaccessed. She tolerated well.  She was discharged ambulatory and in stable condition from clinic with husband, Samantha Reynolds.  She is aware of her follow up appointments.

## 2022-09-12 NOTE — Progress Notes (Signed)
Patient called reporting that she has had a rough weekend. She has felt very weak and worn out. She tells me that her BP has been 80s systolic when checked at home today. She is trying to drink plenty of fluids but does not feel that it is bring her BP up at all. Dr. Ellin Saba made aware. Patient to get 1L House Fluids over 2hrs in the clinic tomorrow or go to the ED if she begins to suddenly feel worse. Appointment time pending. Patient verbalized understanding.

## 2022-09-13 ENCOUNTER — Encounter (HOSPITAL_COMMUNITY): Payer: Self-pay | Admitting: Hematology

## 2022-09-13 ENCOUNTER — Encounter: Payer: Self-pay | Admitting: Hematology

## 2022-09-13 ENCOUNTER — Inpatient Hospital Stay: Payer: 59

## 2022-09-13 VITALS — BP 103/71 | HR 94 | Temp 97.7°F | Resp 18 | Wt 136.5 lb

## 2022-09-13 DIAGNOSIS — C20 Malignant neoplasm of rectum: Secondary | ICD-10-CM

## 2022-09-13 DIAGNOSIS — Z5112 Encounter for antineoplastic immunotherapy: Secondary | ICD-10-CM | POA: Diagnosis not present

## 2022-09-13 MED ORDER — POTASSIUM CHLORIDE IN NACL 20-0.9 MEQ/L-% IV SOLN
Freq: Once | INTRAVENOUS | Status: AC
  Filled 2022-09-13: qty 1000

## 2022-09-13 MED ORDER — HEPARIN SOD (PORK) LOCK FLUSH 100 UNIT/ML IV SOLN
500.0000 [IU] | Freq: Once | INTRAVENOUS | Status: AC | PRN
  Administered 2022-09-13: 500 [IU]

## 2022-09-13 MED ORDER — MAGNESIUM SULFATE 2 GM/50ML IV SOLN
2.0000 g | Freq: Once | INTRAVENOUS | Status: AC
  Administered 2022-09-13: 2 g via INTRAVENOUS
  Filled 2022-09-13: qty 50

## 2022-09-13 MED ORDER — SODIUM CHLORIDE 0.9% FLUSH
10.0000 mL | Freq: Once | INTRAVENOUS | Status: AC | PRN
  Administered 2022-09-13: 10 mL

## 2022-09-13 NOTE — Patient Instructions (Signed)
[facility]  Discharge Instructions: Thank you for choosing [facility] to provide your oncology and hematology care.  If you have a lab appointment with the Cancer Center - please note that after April 8th, 2024, all labs will be drawn in the cancer center.  You do not have to check in or register with the main entrance as you have in the past but will complete your check-in in the cancer center.  Wear comfortable clothing and clothing appropriate for easy access to any Portacath or PICC line.   We strive to give you quality time with your provider. You may need to reschedule your appointment if you arrive late (15 or more minutes).  Arriving late affects you and other patients whose appointments are after yours.  Also, if you miss three or more appointments without notifying the office, you may be dismissed from the clinic at the provider's discretion.      For prescription refill requests, have your pharmacy contact our office and allow 72 hours for refills to be completed.    Today you received the following chemotherapy and/or immunotherapy agents House fluids      To help prevent nausea and vomiting after your treatment, we encourage you to take your nausea medication as directed.  BELOW ARE SYMPTOMS THAT SHOULD BE REPORTED IMMEDIATELY: *FEVER GREATER THAN 100.4 F (38 C) OR HIGHER *CHILLS OR SWEATING *NAUSEA AND VOMITING THAT IS NOT CONTROLLED WITH YOUR NAUSEA MEDICATION *UNUSUAL SHORTNESS OF BREATH *UNUSUAL BRUISING OR BLEEDING *URINARY PROBLEMS (pain or burning when urinating, or frequent urination) *BOWEL PROBLEMS (unusual diarrhea, constipation, pain near the anus) TENDERNESS IN MOUTH AND THROAT WITH OR WITHOUT PRESENCE OF ULCERS (sore throat, sores in mouth, or a toothache) UNUSUAL RASH, SWELLING OR PAIN  UNUSUAL VAGINAL DISCHARGE OR ITCHING   Items with * indicate a potential emergency and should be followed up as soon as possible or go to  the Emergency Department if any problems should occur.  Please show the CHEMOTHERAPY ALERT CARD or IMMUNOTHERAPY ALERT CARD at check-in to the Emergency Department and triage nurse.  Should you have questions after your visit or need to cancel or reschedule your appointment, please contact [facility] [phone]  and follow the prompts.  Office hours are 8:00 a.m. to 4:30 p.m. Monday - Friday. Please note that voicemails left after 4:00 p.m. may not be returned until the following business day.  We are closed weekends and major holidays. You have access to a nurse at all times for urgent questions. Please call the main number to the clinic [phone] and follow the prompts.  For any non-urgent questions, you may also contact your provider using MyChart. We now offer e-Visits for anyone 18 and older to request care online for non-urgent symptoms. For details visit [email or web address].   Also download the MyChart app! Go to the app store, search "MyChart", open the app, select [facility], and log in with your MyChart username and password.   

## 2022-09-13 NOTE — Progress Notes (Signed)
Patient presents today for House fluids per providers order.  Vital signs WNL.  Patient has no new complaints at this time.  Stable during infusion without adverse affects.  Vital signs stable.  No complaints at this time.  Discharge from clinic ambulatory in stable condition.  Alert and oriented X 3.  Follow up with [facility] as scheduled.

## 2022-09-13 NOTE — Progress Notes (Addendum)
  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  There were no signs of acute toxicity after treatment.  Plan: The patient will receive a call in about one month from the radiation oncology department. She will continue surveillance in the brain oncology program with MRI in 3 months time as well as continue follow up with Dr. Ellin Saba as well.      Osker Mason, PAC

## 2022-09-15 NOTE — Radiation Completion Notes (Signed)
Patient Name: Samantha Reynolds, Samantha Reynolds MRN: [id number] Date of Birth: [birth year] Referring Physician: [name], M.D. Date of Service: 2022-09-15 Radiation Oncologist: Dorothy Puffer, M.D. [facility] [place]                             RADIATION ONCOLOGY END OF TREATMENT NOTE     Diagnosis: C79.51 Secondary malignant neoplasm of bone; C79.31 Secondary malignant neoplasm of brain Staging on 2018-03-13: 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HCC) T=cT3, N=cN1b, M=cM1a Intent: Palliative     ==========DELIVERED PLANS==========  First Treatment Date: 2022-08-31 - Last Treatment Date: 2022-08-31   Plan Name: Brain_SRS Site: Brain Technique: SBRT/SRT-3D Mode: Photon Dose Per Fraction: 20 Gy Prescribed Dose (Delivered / Prescribed): 20 Gy / 20 Gy Prescribed Fxs (Delivered / Prescribed): 1 / 1     ==========ON TREATMENT VISIT DATES========== 2022-08-31     ==========UPCOMING VISITS==========       ==========APPENDIX - ON TREATMENT VISIT NOTES==========   See weekly On Treatment Notes in Epic for details.

## 2022-09-20 NOTE — Progress Notes (Signed)
[facility] [address] [place] [address]    Clinic Day:  09/21/2022  Referring physician: Babs Sciara, MD  Patient Care Team: Babs Sciara, MD as PCP - General (Family Medicine) [name], MD as Medical Oncologist (Medical Oncology)   ASSESSMENT & PLAN:   Assessment: 1.  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  Liver biopsy on 03/09/2018 consistent with adenocarcinoma. 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 -7 cycles of FOLFOX with vectibix completed on 06/27/2018. -XRT with Xeloda from 07/30/2018 through 09/06/2018. -Right liver lesion microwave ablation on 10/15/2018 at [facility]. -Low anterior resection and diverting ileostomy on 12/07/2018, pathology [id number], 0/6 lymph nodes positive, margins negative. -Loop ileostomy reversal on 04/17/2019. - Microwave ablation of the right hepatic lobe lesion by Dr. Selena Batten around 08/15/2019. -SBRT to the left lower lobe lung lesion and left liver lobe lesion on 12/13/2019 at [facility]. - Right upper lobe lesion SBRT from 08/06/2020 through 08/13/2020. - 3 left lung lesions and subcarinal lymph node IMRT from 09/21/2021 through 10/05/2021. - Liver biopsy (06/09/2022): Metastatic moderately differentiated colonic adenocarcinoma. - NGS: KRAS/NRAS/BRAF wild-type, MSI-stable, HER2-0, negative for other targetable mutations - Cycle 1 of FOLFOX and bevacizumab on 07/06/2022 - XRT to the right neck from 07/14/2022 through 08/03/2022    Plan: 1.  Stage IV rectal adenocarcinoma, BRAF/RAS wild-type, MSI-stable: - She had oxaliplatin reaction with cycle 4 on 09/07/2022.  She had to receive Solu-Medrol. - Reviewed labs today: Normal LFTs and creatinine.  CBC shows mild leukopenia with ANC of 1.6 which is gradually decreasing. - Recommend adding long-acting G-CSF on day 3. - We discussed that she does not have a lot of options if we lose  oxaliplatin.  Hence I have recommended that we premedicate her with steroids, H1 and H2 blockers and prolong the infusion time of oxaliplatin to 4 hours.  She is agreeable.  Will proceed with the plan.  RTC 2 weeks for follow-up.  She is planning on vacationing around 20 July.  We will push her cycle 6 to 3 weeks.   2.  Hyperbilirubinemia: - She has Gilbert's syndrome.  Total bilirubin is 4.1.   3.  Normocytic anemia: - Hemoglobin is 11.1.  This is stable.   4.  Leukopenia and thrombocytopenia: - Intermittent leukopenia and thrombocytopenia exacerbated by splenomegaly.   5.  Brain metastasis: - Brain MRI on 08/18/2022 with 3 mm nodular enhancing lesion in the anterior right frontal lobe. - She is status post SRS.   Addendum: 10 to 15 minutes after the oxaliplatin infusion started, she developed itching in her ear canal which was similar to reaction symptoms 2 weeks ago.  Oxaliplatin was held.  She developed erythema around her mouth and lower part of nose.  We have given another dose of Solu-Medrol 125 mg.  She also had developed some twitchings of her right leg.  This was followed by numbness in the hands which also resolved.  After the erythema subsided, we have restarted oxaliplatin.  She was also given Ativan 0.5 mg.  I have checked on her multiple times during the infusion.  She has successfully completed oxaliplatin or 4 hours.  Prior to next infusion, we plan to increase dexamethasone to 20 mg, still keep the Solu-Medrol 125 mg, increase the dose of Pepcid to 40 mg and add Ativan 0.5 mg to the premeds.  Orders Placed This Encounter  Procedures   Magnesium    Standing  Status:   Future    Standing Expiration Date:   10/26/2023   CBC with Differential    Standing Status:   Future    Standing Expiration Date:   10/26/2023   Comprehensive metabolic panel    Standing Status:   Future    Standing Expiration Date:   10/26/2023   Urinalysis, dipstick only    Standing Status:   Future     Standing Expiration Date:   10/26/2023   Magnesium    Standing Status:   Future    Standing Expiration Date:   11/09/2023   CBC with Differential    Standing Status:   Future    Standing Expiration Date:   11/09/2023   Comprehensive metabolic panel    Standing Status:   Future    Standing Expiration Date:   11/09/2023   Urinalysis, dipstick only    Standing Status:   Future    Standing Expiration Date:   11/09/2023      I,Katie Daubenspeck,acting as a scribe for [name], MD.,have documented all relevant documentation on the behalf of [name], MD,as directed by  [name], MD while in the presence of [name], MD.   I, [name] MD, have reviewed the above documentation for accuracy and completeness, and I agree with the above.   [name], MD   6/26/20244:44 PM  CHIEF COMPLAINT:   Diagnosis: metastatic rectal cancer to liver    Cancer Staging  Malignant neoplasm of rectum [facility]) Staging form: Colon and Rectum, AJCC 8th Edition - Clinical stage from 03/13/2018: Stage IVA (cT3, cN1b, cM1a) - Signed by [name], MD on 03/13/2018    Prior Therapy: 1. FOLFOX & vectibix x 7 cycles from 03/14/2018 to 06/27/2018. 2. XRT with Xeloda from 07/30/2018 to 09/06/2018. 3. Right liver lesion microwave ablation on 10/15/2018. 4. Low anterior resection and diverting ileostomy on 12/07/2018. 5. Right hepatic lobe microwave ablation on 08/15/19 6. SBRT to LLL and liver 11/15/2019 -12/13/2019. 7. SBRT to RUL 08/06/20 - 08/13/20 8. IMRT to 3 left lung lesions and subcarinal LN 09/21/21 - 10/05/21 9. XRT to C6 07/14/22 - 08/03/22  Current Therapy:  FOLFOX and bevacizumab    HISTORY OF PRESENT ILLNESS:   Oncology History  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HCC)  02/26/2018 Initial Diagnosis   Rectal cancer (HCC)   03/13/2018 Cancer Staging   Staging form: Colon and Rectum, AJCC 8th Edition - Clinical stage from 03/13/2018:  Stage IVA (cT3, cN1b, cM1a) - Signed by [name], MD on 03/13/2018   03/14/2018 - 06/29/2018 Chemotherapy   The patient had palonosetron (ALOXI) injection 0.25 mg, 0.25 mg, Intravenous,  Once, 7 of 8 cycles Administration: 0.25 mg (03/14/2018), 0.25 mg (03/27/2018), 0.25 mg (04/18/2018), 0.25 mg (05/02/2018), 0.25 mg (05/16/2018), 0.25 mg (06/05/2018), 0.25 mg (06/27/2018) leucovorin 800 mg in dextrose 5 % 250 mL infusion, 772 mg, Intravenous,  Once, 7 of 8 cycles Administration: 800 mg (03/14/2018), 800 mg (03/27/2018), 800 mg (05/02/2018), 800 mg (05/16/2018), 700 mg (04/18/2018), 800 mg (06/05/2018), 800 mg (06/27/2018) oxaliplatin (ELOXATIN) 165 mg in dextrose 5 % 500 mL chemo infusion, 85 mg/m2 = 165 mg, Intravenous,  Once, 6 of 7 cycles Dose modification: 68 mg/m2 (80 % of original dose 85 mg/m2, Cycle 4, Reason: Provider Judgment) Administration: 165 mg (03/14/2018), 165 mg (03/27/2018), 130 mg (05/02/2018), 130 mg (05/16/2018), 130 mg (06/05/2018), 130 mg (06/27/2018) panitumumab (VECTIBIX) 500 mg in sodium chloride 0.9 % 100 mL chemo infusion, 480 mg, Intravenous,  Once, 4  of 5 cycles Administration: 500 mg (04/18/2018), 480 mg (05/02/2018), 480 mg (05/16/2018), 480 mg (06/05/2018) fluorouracil (ADRUCIL) chemo injection 750 mg, 400 mg/m2 = 750 mg (100 % of original dose 400 mg/m2), Intravenous,  Once, 6 of 7 cycles Dose modification: 400 mg/m2 (original dose 400 mg/m2, Cycle 1), 400 mg/m2 (original dose 400 mg/m2, Cycle 3) Administration: 750 mg (03/14/2018), 750 mg (04/18/2018), 750 mg (05/02/2018), 750 mg (05/16/2018), 750 mg (06/05/2018), 750 mg (06/27/2018) fosaprepitant (EMEND) 150 mg, dexamethasone (DECADRON) 12 mg in sodium chloride 0.9 % 145 mL IVPB, , Intravenous,  Once, 4 of 5 cycles Administration:  (05/02/2018),  (05/16/2018),  (06/05/2018),  (06/27/2018) fluorouracil (ADRUCIL) 4,650 mg in sodium chloride 0.9 % 57 mL chemo infusion, 2,400 mg/m2 = 4,650 mg, Intravenous, 1 Day/Dose, 7 of 8  cycles Administration: 4,650 mg (03/14/2018), 4,650 mg (03/27/2018), 4,650 mg (04/18/2018), 4,650 mg (05/02/2018), 4,650 mg (05/16/2018), 4,650 mg (06/05/2018), 4,650 mg (06/27/2018)  for chemotherapy treatment.    07/06/2022 -  Chemotherapy   Patient is on Treatment Plan : COLORECTAL FOLFOX + Bevacizumab q14d        INTERVAL HISTORY:   Samantha Reynolds is a 57 y.o. female presenting to clinic today for follow up of metastatic rectal cancer to liver. She was last seen by me on 09/07/22.  Today, she states that she is doing well overall. Her appetite level is at 80%. Her energy level is at 60%.  PAST MEDICAL HISTORY:   Past Medical History: Past Medical History:  Diagnosis Date   Anxiety    Cancer (HCC) 2013   thyroid   Endometrial polyp    Endometrial thickening on ultra sound    GERD (gastroesophageal reflux disease)    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   Worse in her 51s when she was ill   H/O Hashimoto thyroiditis    History of chemotherapy    History of radiation therapy    History of thyroid cancer no recurrence   2013--  s/p  left lobe thyroidectomy--  follicular varient papillary / lymphocyctic thyroiditis   Hyperlipidemia 11/09/2015   Hypothyroidism    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HCC) 02/26/2018    Surgical History: Past Surgical History:  Procedure Laterality Date   BIOPSY  02/19/2018   Procedure: BIOPSY;  Surgeon: Malissa Hippo, MD;  [facility];  Service: Endoscopy;;  rectum   COLONOSCOPY N/A 08/20/2015   Procedure: COLONOSCOPY;  Surgeon: Malissa Hippo, MD;  [facility];  Service: Endoscopy;  Laterality: N/A;  730   COLONOSCOPY WITH PROPOFOL N/A 07/21/2021   Procedure: COLONOSCOPY WITH PROPOFOL;  Surgeon: Malissa Hippo, MD;  [facility]: [facility];  Service: Endoscopy;  Laterality: N/A;  10:10   D & C HYSTEROOSCOPY W/ THERMACHOICE ENDOMETRIAL ABLATION  08-13-2004   DIVERTING ILEOSTOMY N/A 12/07/2018   Procedure: DIVERTING LOOP ILEOSTOMY;  Surgeon:  Romie Levee, MD;  Location: [facility];  Service: General;  Laterality: N/A;   FLEXIBLE SIGMOIDOSCOPY N/A 02/19/2018   Procedure: FLEXIBLE SIGMOIDOSCOPY;  Surgeon: Malissa Hippo, MD;  [facility];  Service: Endoscopy;  Laterality: N/A;   HYSTEROSCOPY WITH D & C N/A 04/30/2015   Procedure: DILATATION AND CURETTAGE / INTENDED HYSTEROSCOPY;  Surgeon: Marcelle Overlie, MD;  Location: [facility] ;  Service: Gynecology;  Laterality: N/A;   ILEOSTOMY CLOSURE N/A 04/17/2019   Procedure: LOOP ILEOSTOMY REVERSAL;  Surgeon: Romie Levee, MD;  Location: [facility];  Service: General;  Laterality: N/A;   IR US GUIDE BX ASP/DRAIN  03/09/2018  LAPAROSCOPIC CHOLECYSTECTOMY  04/1996   LAPAROSCOPY N/A 12/11/2018   Procedure: LAPAROSCOPY  ILEOSTOMY REVISION AND ABDOMINAL WASHOUT;  Surgeon: Romie Levee, MD;  Location: [facility];  Service: General;  Laterality: N/A;   Liver Microwave   10/17/2018   POLYPECTOMY  08/20/2015   Procedure: POLYPECTOMY;  Surgeon: Malissa Hippo, MD;  [facility];  Service: Endoscopy;;  Splenic flexure polypectomy   POLYPECTOMY  02/19/2018   Procedure: POLYPECTOMY;  Surgeon: Malissa Hippo, MD;  [facility];  Service: Endoscopy;;  rectum   PORTACATH PLACEMENT N/A 03/14/2018   Procedure: INSERTION PORT-A-CATH;  Surgeon: Franky Macho, MD;  Location: [facility];  Service: General;  Laterality: N/A;   REDUCTION INCARCERATED UTERUS  06-20-2000   intrauterine preg. 13 wks /  urinary retention   THYROID LOBECTOMY  11/24/2011   Procedure: THYROID LOBECTOMY;  Surgeon: Velora Heckler, MD;  Location: [facility];  Service: General;  Laterality: Left;  Left Thyroid Lobectomy   TUBAL LIGATION  2002   XI ROBOTIC ASSISTED LOWER ANTERIOR RESECTION N/A 12/07/2018   Procedure: XI ROBOTIC ASSISTED LOWER ANTERIOR RESECTION, SPENIC FLEXURE IMMOBILIZATION, COLOANAL ANASTOMOSIS, RIGID PROCTOSCOPY;  Surgeon: Romie Levee, MD;  Location: [facility];  Service: General;   Laterality: N/A;    Social History: Social History   Socioeconomic History   Marital status: Married    Spouse name: Not on file   Number of children: 4   Years of education: Not on file   Highest education level: Not on file  Occupational History    Comment: Systems developer at [employer]  Tobacco Use   Smoking status: Former    Packs/day: 0.25    Years: 10.00    Additional pack years: 0.00    Total pack years: 2.50    Types: Cigarettes    Quit date: 10/31/1991    Years since quitting: 30.9   Smokeless tobacco: Never  Vaping Use   Vaping Use: Never used  Substance and Sexual Activity   Alcohol use: No   Drug use: No   Sexual activity: Not Currently  Other Topics Concern   Not on file  Social History Narrative   Lives with husband Loraine Leriche and 56 year old son Almeta Monas   Husband is Technical brewer of [employer]   Social Determinants of Health   Financial Resource Strain: Low Risk  (02/26/2018)   Overall Financial Resource Strain (CARDIA)    Difficulty of Paying Living Expenses: Not hard at all  Food Insecurity: No Food Insecurity (02/26/2018)   Hunger Vital Sign    Worried About Running Out of Food in the Last Year: Never true    Ran Out of Food in the Last Year: Never true  Transportation Needs: No Transportation Needs (02/26/2018)   PRAPARE - Administrator, Civil Service (Medical): No    Lack of Transportation (Non-Medical): No  Physical Activity: Inactive (02/26/2018)   Exercise Vital Sign    Days of Exercise per Week: 0 days    Minutes of Exercise per Session: 0 min  Stress: Stress Concern Present (02/26/2018)   [employer] of Occupational Health - Occupational Stress Questionnaire    Feeling of Stress : To some extent  Social Connections: Socially Integrated (02/26/2018)   Social Connection and Isolation Panel [NHANES]    Frequency of Communication with Friends and Family: More than three times a week    Frequency of Social Gatherings with Friends and Family:  Twice a week    Attends  Religious Services: More than 4 times per year    Active Member of Clubs or Organizations: Yes    Attends Club or Organization Meetings: More than 4 times per year    Marital Status: Married  Catering manager Violence: Not At Risk (02/26/2018)   Humiliation, Afraid, Rape, and Kick questionnaire    Fear of Current or Ex-Partner: No    Emotionally Abused: No    Physically Abused: No    Sexually Abused: No    Family History: Family History  Problem Relation Age of Onset   Hypertension Mother    Hypertension Father    Prostate cancer Father    Cancer Maternal Aunt        spine/back   Cancer Paternal Grandfather        lung   Healthy Son    Healthy Son    Healthy Son    Healthy Son    Colon cancer Neg Hx     Current Medications:  Current Outpatient Medications:    ALPRAZolam (XANAX) 0.5 MG tablet, Take 1 tablet (0.5 mg total) by mouth 3 (three) times daily as needed for anxiety., Disp: 90 tablet, Rfl: 5   amitriptyline (ELAVIL) 10 MG tablet, Take 1 tablet (10 mg total) by mouth 2 (two) times daily., Disp: 180 tablet, Rfl: 3   ARMOUR THYROID 60 MG tablet, Take 1 tablet (60 mg total) by mouth daily., Disp: 90 tablet, Rfl: 1   ARMOUR THYROID 60 MG tablet, Take 1 tablet (60 mg total) by mouth daily., Disp: 90 tablet, Rfl: 1   dexamethasone (DECADRON) 2 MG tablet, Take 1 tablet (2 mg total) by mouth 2 (two) times daily., Disp: 60 tablet, Rfl: 1   loperamide (IMODIUM) 2 MG capsule, Take 1 capsule (2 mg total) by mouth 3 (three) times daily. (Patient taking differently: Take 2 mg by mouth daily.), Disp: 30 capsule, Rfl: 0   pregabalin (LYRICA) 25 MG capsule, Take 1 capsule (25 mg total) by mouth 2 (two) times daily., Disp: 60 capsule, Rfl: 3   prochlorperazine (COMPAZINE) 10 MG tablet, Take 1 tablet (10 mg total) by mouth every 6 (six) hours as needed for nausea or vomiting., Disp: 30 tablet, Rfl: 4   sucralfate (CARAFATE) 1 g tablet, Take 1 tablet (1 g total) by  mouth 4 (four) times daily -  with meals and at bedtime. Crush 1 tablet in 1 oz water and drink 5 min before meals for radiation induced esophagitis, Disp: 120 tablet, Rfl: 2   tiZANidine (ZANAFLEX) 4 MG tablet, Take 1 tablet (4 mg total) by mouth every 6 (six) hours as needed for muscle spasms (take for tightness and pain in the scapular region)., Disp: 56 tablet, Rfl: 2 No current facility-administered medications for this visit.  Facility-Administered Medications Ordered in Other Visits:    0.9 %  sodium chloride infusion, , Intravenous, Continuous, [name], MD, Stopped at 09/21/22 1225   clindamycin (CLEOCIN) 900 mg in dextrose 5 % 50 mL IVPB, 900 mg, Intravenous, 60 min Pre-Op **AND** gentamicin (GARAMYCIN) 350 mg in dextrose 5 % 50 mL IVPB, 5 mg/kg, Intravenous, 60 min Pre-Op, Romie Levee, MD   clindamycin (CLEOCIN) 900 mg in dextrose 5 % 50 mL IVPB, 900 mg, Intravenous, 60 min Pre-Op **AND** gentamicin (GARAMYCIN) 5 mg/kg in dextrose 5 % 50 mL IVPB, 5 mg/kg, Intravenous, 60 min Pre-Op, Romie Levee, MD   fluorouracil (ADRUCIL) 3,500 mg in sodium chloride 0.9 % 80 mL chemo infusion, 1,920 mg/m2 (Treatment Plan Recorded), Intravenous, 1  day or 1 dose, [name], MD, Infusion Verify at 09/21/22 1641   heparin lock flush 100 unit/mL, 500 Units, Intracatheter, Once PRN, [name], MD   sodium chloride 0.9 % 1,000 mL with potassium chloride 20 mEq, magnesium sulfate 2 g infusion, , Intravenous, Once, [name], MD   sodium chloride flush (NS) 0.9 % injection 10 mL, 10 mL, Intracatheter, PRN, [name], MD   Allergies: Allergies  Allergen Reactions   Oxaliplatin Other (See Comments)    Patient had hypersensitivity reaction to Oxaliplatin. Patient c/o itching in her ears and eyes. Pt also c/o being hot, flushed, dizzy with redness on pt's face, arms, and legs. Pt c/o tingling in her hands and legs. Pt also had a dry throat with coughing.  See progress note from 09/07/22 at 1130. Patient was not able to complete Oxaliplatin infusion.    Demerol [Meperidine] Itching    All over the body.   Penicillins Hives     childhood, does not remember if it spread all over the body or not. Did it involve swelling of the face/tongue/throat, SOB, or low BP? No Did it involve sudden or severe rash/hives, skin peeling, or any reaction on the inside of your mouth or nose? Yes Did you need to seek medical attention at a hospital or doctor's office? Yes When did it last happen?      childhood allergy If all above answers are "NO", may proceed with cephalosporin use.    Levaquin [Levofloxacin] Other (See Comments)    Patient has Aortic Aneurysm and is not indicated with this diagnosis   Other Hives and Other (See Comments)    Fresh coconut    Vancomycin Rash    Will need Benadryl prior to administration per IV. "Red man syndrome"    REVIEW OF SYSTEMS:   Review of Systems  Constitutional:  Negative for chills, fatigue and fever.  HENT:   Positive for trouble swallowing. Negative for lump/mass, mouth sores, nosebleeds and sore throat.   Eyes:  Negative for eye problems.  Respiratory:  Negative for cough and shortness of breath.   Cardiovascular:  Negative for chest pain, leg swelling and palpitations.  Gastrointestinal:  Positive for constipation. Negative for abdominal pain, diarrhea, nausea and vomiting.  Genitourinary:  Negative for bladder incontinence, difficulty urinating, dysuria, frequency, hematuria and nocturia.   Musculoskeletal:  Negative for arthralgias, back pain, flank pain, myalgias and neck pain.  Skin:  Negative for itching and rash.  Neurological:  Negative for dizziness, headaches and numbness.  Hematological:  Does not bruise/bleed easily.  Psychiatric/Behavioral:  Positive for depression. Negative for sleep disturbance and suicidal ideas. The patient is nervous/anxious.   All other systems reviewed and are negative.     VITALS:   Last menstrual period 02/16/2015.  Wt Readings from Last 3 Encounters:  09/21/22 137 lb 9.6 oz (62.4 kg)  09/13/22 136 lb 8 oz (61.9 kg)  09/07/22 138 lb (62.6 kg)    There is no height or weight on file to calculate BMI.  Performance status (ECOG): 1 - Symptomatic but completely ambulatory  PHYSICAL EXAM:   Physical Exam Vitals and nursing note reviewed. Exam conducted with a chaperone present.  Constitutional:      Appearance: Normal appearance.  Cardiovascular:     Rate and Rhythm: Normal rate and regular rhythm.     Pulses: Normal pulses.     Heart sounds: Normal heart sounds.  Pulmonary:     Effort: Pulmonary effort is normal.  Breath sounds: Normal breath sounds.  Abdominal:     Palpations: Abdomen is soft. There is no hepatomegaly, splenomegaly or mass.     Tenderness: There is no abdominal tenderness.  Musculoskeletal:     Right lower leg: No edema.     Left lower leg: No edema.  Lymphadenopathy:     Cervical: No cervical adenopathy.     Right cervical: No superficial, deep or posterior cervical adenopathy.    Left cervical: No superficial, deep or posterior cervical adenopathy.     Upper Body:     Right upper body: No supraclavicular or axillary adenopathy.     Left upper body: No supraclavicular or axillary adenopathy.  Neurological:     General: No focal deficit present.     Mental Status: She is alert and oriented to person, place, and time.  Psychiatric:        Mood and Affect: Mood normal.        Behavior: Behavior normal.     LABS:      Latest Ref Rng & Units 09/21/2022    7:54 AM 09/07/2022    8:36 AM 08/24/2022    8:33 AM  CBC  WBC 4.0 - 10.5 K/uL 2.5  2.7  2.9   Hemoglobin 12.0 - 15.0 g/dL 16.1  09.6  04.5   Hematocrit 36.0 - 46.0 % 32.5  29.5  30.5   Platelets 150 - 400 K/uL 106  106  124       Latest Ref Rng & Units 09/21/2022    7:54 AM 09/07/2022    8:36 AM 08/24/2022    8:33 AM  CMP  Glucose 70 - 99 mg/dL 99  89  98    BUN 6 - 20 mg/dL 12  11  8    Creatinine 0.44 - 1.00 mg/dL 4.09  8.11  9.14   Sodium 135 - 145 mmol/L 135  135  134   Potassium 3.5 - 5.1 mmol/L 3.4  3.6  3.2   Chloride 98 - 111 mmol/L 104  104  102   CO2 22 - 32 mmol/L 22  23  20    Calcium 8.9 - 10.3 mg/dL 8.7  8.5  8.4   Total Protein 6.5 - 8.1 g/dL 7.0  6.5  6.8   Total Bilirubin 0.3 - 1.2 mg/dL 4.1  3.0  5.4   Alkaline Phos 38 - 126 U/L 58  61  70   AST 15 - 41 U/L 21  18  24    ALT 0 - 44 U/L 10  10  20       Lab Results  Component Value Date   CEA1 7.1 (H) 08/24/2022   /  CEA  Date Value Ref Range Status  08/24/2022 7.1 (H) 0.0 - 4.7 ng/mL Final    Comment:    (NOTE)                             Nonsmokers          <3.9                             Smokers             <5.6 [employer] Electrochemiluminescence Immunoassay (ECLIA) Values obtained with different assay methods or kits cannot be used interchangeably.  Results cannot be interpreted as absolute evidence of the presence or absence of malignant disease. Performed  At: [facility] [address] [place], [place] [id number] Jolene Schimke MD ON:[id number]    No results found for: "PSA1" No results found for: "CAN199" No results found for: "CAN125"  No results found for: "TOTALPROTELP", "ALBUMINELP", "A1GS", "A2GS", "BETS", "BETA2SER", "GAMS", "MSPIKE", "SPEI" Lab Results  Component Value Date   TIBC 333 09/07/2022   TIBC 364 05/11/2022   TIBC 385 11/09/2020   FERRITIN 89 09/07/2022   FERRITIN 61 05/11/2022   FERRITIN 54 11/09/2020   IRONPCTSAT 36 (H) 09/07/2022   IRONPCTSAT 26 05/11/2022   IRONPCTSAT 22 11/09/2020   Lab Results  Component Value Date   LDH 154 01/20/2020     STUDIES:   No results found.

## 2022-09-21 ENCOUNTER — Inpatient Hospital Stay: Payer: 59

## 2022-09-21 ENCOUNTER — Inpatient Hospital Stay (HOSPITAL_BASED_OUTPATIENT_CLINIC_OR_DEPARTMENT_OTHER): Payer: 59 | Admitting: Hematology

## 2022-09-21 VITALS — BP 104/79 | HR 115 | Temp 97.6°F | Resp 16

## 2022-09-21 DIAGNOSIS — C20 Malignant neoplasm of rectum: Secondary | ICD-10-CM

## 2022-09-21 DIAGNOSIS — C787 Secondary malignant neoplasm of liver and intrahepatic bile duct: Secondary | ICD-10-CM | POA: Diagnosis not present

## 2022-09-21 DIAGNOSIS — Z5112 Encounter for antineoplastic immunotherapy: Secondary | ICD-10-CM | POA: Diagnosis not present

## 2022-09-21 LAB — COMPREHENSIVE METABOLIC PANEL
ALT: 10 U/L (ref 0–44)
AST: 21 U/L (ref 15–41)
Albumin: 4 g/dL (ref 3.5–5.0)
Alkaline Phosphatase: 58 U/L (ref 38–126)
Anion gap: 9 (ref 5–15)
BUN: 12 mg/dL (ref 6–20)
CO2: 22 mmol/L (ref 22–32)
Calcium: 8.7 mg/dL — ABNORMAL LOW (ref 8.9–10.3)
Chloride: 104 mmol/L (ref 98–111)
Creatinine, Ser: 0.93 mg/dL (ref 0.44–1.00)
GFR, Estimated: >60 mL/min (ref >60)
Glucose, Bld: 99 mg/dL (ref 70–99)
Potassium: 3.4 mmol/L — ABNORMAL LOW (ref 3.5–5.1)
Sodium: 135 mmol/L (ref 135–145)
Total Bilirubin: 4.1 mg/dL — ABNORMAL HIGH (ref 0.3–1.2)
Total Protein: 7 g/dL (ref 6.5–8.1)

## 2022-09-21 LAB — CBC WITH DIFFERENTIAL/PLATELET
Abs Immature Granulocytes: 0 10*3/uL (ref 0.00–0.07)
Basophils Absolute: 0 10*3/uL (ref 0.0–0.1)
Basophils Relative: 0 %
Eosinophils Absolute: 0 10*3/uL (ref 0.0–0.5)
Eosinophils Relative: 1 %
HCT: 32.5 % — ABNORMAL LOW (ref 36.0–46.0)
Hemoglobin: 11.1 g/dL — ABNORMAL LOW (ref 12.0–15.0)
Immature Granulocytes: 0 %
Lymphocytes Relative: 24 %
Lymphs Abs: 0.6 10*3/uL — ABNORMAL LOW (ref 0.7–4.0)
MCH: 36.9 pg — ABNORMAL HIGH (ref 26.0–34.0)
MCHC: 34.2 g/dL (ref 30.0–36.0)
MCV: 108 fL — ABNORMAL HIGH (ref 80.0–100.0)
Monocytes Absolute: 0.2 10*3/uL (ref 0.1–1.0)
Monocytes Relative: 9 %
Neutro Abs: 1.6 10*3/uL — ABNORMAL LOW (ref 1.7–7.7)
Neutrophils Relative %: 66 %
Platelets: 106 10*3/uL — ABNORMAL LOW (ref 150–400)
RBC: 3.01 MIL/uL — ABNORMAL LOW (ref 3.87–5.11)
RDW: 15.7 % — ABNORMAL HIGH (ref 11.5–15.5)
WBC: 2.5 10*3/uL — ABNORMAL LOW (ref 4.0–10.5)
nRBC: 0 % (ref 0.0–0.2)

## 2022-09-21 LAB — MAGNESIUM: Magnesium: 2.3 mg/dL (ref 1.7–2.4)

## 2022-09-21 MED ORDER — DEXTROSE 5 % IV SOLN: Freq: Once | INTRAVENOUS | Status: AC

## 2022-09-21 MED ORDER — METHYLPREDNISOLONE SODIUM SUCC 125 MG IJ SOLR
125.0000 mg | Freq: Once | INTRAMUSCULAR | Status: AC
  Administered 2022-09-21: 125 mg via INTRAVENOUS
  Filled 2022-09-21: qty 2

## 2022-09-21 MED ORDER — FLUOROURACIL CHEMO INJECTION 500 MG/10ML
320.0000 mg/m2 | Freq: Once | INTRAVENOUS | Status: AC
  Administered 2022-09-21: 500 mg via INTRAVENOUS
  Filled 2022-09-21: qty 10

## 2022-09-21 MED ORDER — OXALIPLATIN CHEMO INJECTION 100 MG/20ML
68.0000 mg/m2 | Freq: Once | INTRAVENOUS | Status: AC
  Administered 2022-09-21: 115 mg via INTRAVENOUS
  Filled 2022-09-21: qty 20

## 2022-09-21 MED ORDER — SODIUM CHLORIDE 0.9% FLUSH: 10.0000 mL | INTRAVENOUS | Status: DC | PRN

## 2022-09-21 MED ORDER — SODIUM CHLORIDE 0.9 % IV SOLN
1920.0000 mg/m2 | INTRAVENOUS | Status: DC
  Administered 2022-09-21: 3500 mg via INTRAVENOUS
  Filled 2022-09-21: qty 70

## 2022-09-21 MED ORDER — SODIUM CHLORIDE 0.9 % IV SOLN
5.0000 mg/kg | Freq: Once | INTRAVENOUS | Status: AC
  Administered 2022-09-21: 300 mg via INTRAVENOUS
  Filled 2022-09-21: qty 12

## 2022-09-21 MED ORDER — DIPHENHYDRAMINE HCL 50 MG/ML IJ SOLN
50.0000 mg | Freq: Once | INTRAMUSCULAR | Status: AC
  Administered 2022-09-21: 50 mg via INTRAVENOUS
  Filled 2022-09-21: qty 1

## 2022-09-21 MED ORDER — METHYLPREDNISOLONE SODIUM SUCC 125 MG IJ SOLR
125.0000 mg | Freq: Once | INTRAMUSCULAR | Status: AC | PRN
  Administered 2022-09-21: 125 mg via INTRAVENOUS

## 2022-09-21 MED ORDER — HEPARIN SOD (PORK) LOCK FLUSH 100 UNIT/ML IV SOLN: 500.0000 [IU] | Freq: Once | INTRAVENOUS | Status: DC | PRN

## 2022-09-21 MED ORDER — PALONOSETRON HCL INJECTION 0.25 MG/5ML
0.2500 mg | Freq: Once | INTRAVENOUS | Status: AC
  Administered 2022-09-21: 0.25 mg via INTRAVENOUS
  Filled 2022-09-21: qty 5

## 2022-09-21 MED ORDER — SODIUM CHLORIDE 0.9 % IV SOLN
10.0000 mg | Freq: Once | INTRAVENOUS | Status: AC
  Administered 2022-09-21: 10 mg via INTRAVENOUS
  Filled 2022-09-21: qty 10

## 2022-09-21 MED ORDER — FAMOTIDINE IN NACL 20-0.9 MG/50ML-% IV SOLN
20.0000 mg | Freq: Once | INTRAVENOUS | Status: AC
  Administered 2022-09-21: 20 mg via INTRAVENOUS
  Filled 2022-09-21: qty 50

## 2022-09-21 MED ORDER — LEUCOVORIN CALCIUM INJECTION 350 MG
320.0000 mg/m2 | Freq: Once | INTRAVENOUS | Status: AC
  Administered 2022-09-21: 540 mg via INTRAVENOUS
  Filled 2022-09-21: qty 27

## 2022-09-21 MED ORDER — SODIUM CHLORIDE 0.9 % IV SOLN: INTRAVENOUS | Status: DC

## 2022-09-21 MED ORDER — LORAZEPAM 2 MG/ML IJ SOLN
0.5000 mg | Freq: Once | INTRAMUSCULAR | Status: AC
  Administered 2022-09-21: 0.5 mg via INTRAVENOUS
  Filled 2022-09-21: qty 1

## 2022-09-21 NOTE — Patient Instructions (Signed)
[facility]  Discharge Instructions: Thank you for choosing [facility] to provide your oncology and hematology care.  If you have a lab appointment with the Cancer Center - please note that after April 8th, 2024, all labs will be drawn in the cancer center.  You do not have to check in or register with the main entrance as you have in the past but will complete your check-in in the cancer center.  Wear comfortable clothing and clothing appropriate for easy access to any Portacath or PICC line.   We strive to give you quality time with your provider. You may need to reschedule your appointment if you arrive late (15 or more minutes).  Arriving late affects you and other patients whose appointments are after yours.  Also, if you miss three or more appointments without notifying the office, you may be dismissed from the clinic at the provider's discretion.      For prescription refill requests, have your pharmacy contact our office and allow 72 hours for refills to be completed.    Today you received the following chemotherapy and/or immunotherapy agents Folfox 5FU pump start      To help prevent nausea and vomiting after your treatment, we encourage you to take your nausea medication as directed.  BELOW ARE SYMPTOMS THAT SHOULD BE REPORTED IMMEDIATELY: *FEVER GREATER THAN 100.4 F (38 C) OR HIGHER *CHILLS OR SWEATING *NAUSEA AND VOMITING THAT IS NOT CONTROLLED WITH YOUR NAUSEA MEDICATION *UNUSUAL SHORTNESS OF BREATH *UNUSUAL BRUISING OR BLEEDING *URINARY PROBLEMS (pain or burning when urinating, or frequent urination) *BOWEL PROBLEMS (unusual diarrhea, constipation, pain near the anus) TENDERNESS IN MOUTH AND THROAT WITH OR WITHOUT PRESENCE OF ULCERS (sore throat, sores in mouth, or a toothache) UNUSUAL RASH, SWELLING OR PAIN  UNUSUAL VAGINAL DISCHARGE OR ITCHING   Items with * indicate a potential emergency and should be followed up as soon as possible or  go to the Emergency Department if any problems should occur.  Please show the CHEMOTHERAPY ALERT CARD or IMMUNOTHERAPY ALERT CARD at check-in to the Emergency Department and triage nurse.  Should you have questions after your visit or need to cancel or reschedule your appointment, please contact [facility] CENTER AT [facility] [phone]  and follow the prompts.  Office hours are 8:00 a.m. to 4:30 p.m. Monday - Friday. Please note that voicemails left after 4:00 p.m. may not be returned until the following business day.  We are closed weekends and major holidays. You have access to a nurse at all times for urgent questions. Please call the main number to the clinic [phone] and follow the prompts.  For any non-urgent questions, you may also contact your provider using MyChart. We now offer e-Visits for anyone 31 and older to request care online for non-urgent symptoms. For details visit [email or web address].   Also download the MyChart app! Go to the app store, search "MyChart", open the app, select [place], and log in with your MyChart username and password.

## 2022-09-21 NOTE — Patient Instructions (Signed)
[facility] at [facility] Discharge Instructions   You were seen and examined today by Dr. Katragadda.  He reviewed the results of your lab work which are normal/stable.   We will proceed with your treatment today.  Return as scheduled.    Thank you for choosing [facility] at [facility] to provide your oncology and hematology care.  To afford each patient quality time with our provider, please arrive at least 15 minutes before your scheduled appointment time.   If you have a lab appointment with the Cancer Center please come in thru the Main Entrance and check in at the main information desk.  You need to re-schedule your appointment should you arrive 10 or more minutes late.  We strive to give you quality time with our providers, and arriving late affects you and other patients whose appointments are after yours.  Also, if you no show three or more times for appointments you may be dismissed from the clinic at the providers discretion.     Again, thank you for choosing [facility].  Our hope is that these requests will decrease the amount of time that you wait before being seen by our physicians.       _____________________________________________________________  Should you have questions after your visit to [facility], please contact our office at [phone] and follow the prompts.  Our office hours are 8:00 a.m. and 4:30 p.m. Monday - Friday.  Please note that voicemails left after 4:00 p.m. may not be returned until the following business day.  We are closed weekends and major holidays.  You do have access to a nurse 24-7, just call the main number to the clinic [phone] and do not press any options, hold on the line and a nurse will answer the phone.    For prescription refill requests, have your pharmacy contact our office and allow 72 hours.    Due to Covid, you will need to wear a mask upon entering  the hospital. If you do not have a mask, a mask will be given to you at the Main Entrance upon arrival. For doctor visits, patients may have 1 support person age 18 or older with them. For treatment visits, patients can not have anyone with them due to social distancing guidelines and our immunocompromised population.      

## 2022-09-21 NOTE — Progress Notes (Signed)
Patient has been examined by Dr. Ellin Saba. Vital signs and labs have been reviewed by MD - ANC, Creatinine, LFTs (bilirubin 4.1), hemoglobin, and platelets are within treatment parameters per M.D. - pt may proceed with treatment.  Primary RN and pharmacy notified.

## 2022-09-21 NOTE — Progress Notes (Signed)
Patient presents today for Avastin/Folfox infusions with 5FU pump start per providers order.  Labs and vital signs reviewed by MD.  Message from Shelley patient okay for treatment.  1125 patient told RN in the room her ears were itching on the inside. RN immediately stopped oxaliplatin. Vitals obtained and MD notified. MD in room at 1130. HR elevated. 1127, itching has stopped per patient. Will give ativan 0.5 ml per MD order and re-evaluate after 15 minutes.    1215-patient is asymptomatic and relaxed from her ativan. HR is 107. No issues at this time. Informed MD of the above, ok to restart oxaliplatin per MD and will change rate of leucovorin to be administered over 30 min per MD. Rate of Leucovorin will now be 537.    1225 Treatment restarted per MD.

## 2022-09-21 NOTE — Progress Notes (Signed)
Additional premedication for oxaliplatin:  Diphenhydramine 50 mg ivpush x 1 Solu-medrol 125 mg ivpush x 1 Famotidine 20 mg ivpb x 1  Infuse oxaliplatin and leucovorin over 4 hours and place oxaliplatin in 1000 ml D5W.  Orders received to add pegfilgrastim 6 mg subcutaneous to pump dc days.  V.O. Dr Carilyn Goodpasture, PharmD

## 2022-09-21 NOTE — Progress Notes (Signed)
1125 patient told RN in the room her ears were itching on the inside. RN immediately stopped oxaliplatin. Vitals obtained and MD notified. MD in room at 1130. HR elevated. 1127, itching has stopped per patient. Will give ativan 0.5 ml per MD order and re-evaluate after.   1215-patient is asymptomatic and relaxed from her ativan. HR is 107. No issues at this time. Informed MD of the above, ok to restart oxaliplatin per MD and will change rate of leucovorin to be administered over 30 min per MD. Rate of Leucovorin will now be 537.   1225 Treatment restarted per MD.    Treatment given per orders. Patient tolerated it well without problems. Vitals stable and discharged home from clinic ambulatory. Follow up as scheduled.

## 2022-09-21 NOTE — Progress Notes (Signed)
Hypersensitivity Reaction note  Date of event: 09/21/22 Time of event: 1125 Generic name of drug involved: Oxaliplatin Name of provider notified of the hypersensitivity reaction: Dr. Ellin Saba Was agent that likely caused hypersensitivity reaction added to Allergies List within EMR? yes Chain of events including reaction signs/symptoms, treatment administered, and outcome (e.g., drug resumed; drug discontinued; sent to Emergency Department; etc.)   1125 patient told RN in the room her ears were itching on the inside. RN immediately stopped oxaliplatin. Vitals obtained and MD notified. MD in room at 1130. HR elevated. 1127, itching has stopped per patient. Will give ativan 0.5 ml per MD order and re-evaluate after.    1215-patient is asymptomatic and relaxed from her ativan. HR is 107. No issues at this time. Informed MD of the above, ok to restart oxaliplatin per MD and will change rate of leucovorin to be administered over 30 min per MD. Rate of Leucovorin will now be 537.    1225 Treatment restarted per MD.  Worthy Rancher, RN 09/21/2022 1:20 PM

## 2022-09-22 NOTE — Progress Notes (Signed)
24 hour hypersensitivity call back, patient has had no further symptoms.

## 2022-09-23 ENCOUNTER — Inpatient Hospital Stay: Payer: 59

## 2022-09-23 VITALS — BP 112/70 | HR 105 | Temp 98.9°F | Resp 20

## 2022-09-23 DIAGNOSIS — Z5112 Encounter for antineoplastic immunotherapy: Secondary | ICD-10-CM | POA: Diagnosis not present

## 2022-09-23 DIAGNOSIS — C20 Malignant neoplasm of rectum: Secondary | ICD-10-CM

## 2022-09-23 MED ORDER — SODIUM CHLORIDE 0.9% FLUSH
10.0000 mL | INTRAVENOUS | Status: DC | PRN
  Administered 2022-09-23: 10 mL

## 2022-09-23 MED ORDER — HEPARIN SOD (PORK) LOCK FLUSH 100 UNIT/ML IV SOLN
500.0000 [IU] | Freq: Once | INTRAVENOUS | Status: AC | PRN
  Administered 2022-09-23: 500 [IU]

## 2022-09-23 MED ORDER — PEGFILGRASTIM-JMDB 6 MG/0.6ML ~~LOC~~ SOSY
6.0000 mg | PREFILLED_SYRINGE | Freq: Once | SUBCUTANEOUS | Status: AC
  Administered 2022-09-23: 6 mg via SUBCUTANEOUS
  Filled 2022-09-23: qty 0.6

## 2022-09-23 NOTE — Patient Instructions (Signed)
[facility]  Discharge Instructions: Thank you for choosing [facility] to provide your oncology and hematology care.  If you have a lab appointment with the Cancer Center - please note that after April 8th, 2024, all labs will be drawn in the cancer center.  You do not have to check in or register with the main entrance as you have in the past but will complete your check-in in the cancer center.  Wear comfortable clothing and clothing appropriate for easy access to any Portacath or PICC line.   We strive to give you quality time with your provider. You may need to reschedule your appointment if you arrive late (15 or more minutes).  Arriving late affects you and other patients whose appointments are after yours.  Also, if you miss three or more appointments without notifying the office, you may be dismissed from the clinic at the provider's discretion.      For prescription refill requests, have your pharmacy contact our office and allow 72 hours for refills to be completed.    Today you received the following chemotherapy and/or immunotherapy agents Fulphila      To help prevent nausea and vomiting after your treatment, we encourage you to take your nausea medication as directed.  BELOW ARE SYMPTOMS THAT SHOULD BE REPORTED IMMEDIATELY: *FEVER GREATER THAN 100.4 F (38 C) OR HIGHER *CHILLS OR SWEATING *NAUSEA AND VOMITING THAT IS NOT CONTROLLED WITH YOUR NAUSEA MEDICATION *UNUSUAL SHORTNESS OF BREATH *UNUSUAL BRUISING OR BLEEDING *URINARY PROBLEMS (pain or burning when urinating, or frequent urination) *BOWEL PROBLEMS (unusual diarrhea, constipation, pain near the anus) TENDERNESS IN MOUTH AND THROAT WITH OR WITHOUT PRESENCE OF ULCERS (sore throat, sores in mouth, or a toothache) UNUSUAL RASH, SWELLING OR PAIN  UNUSUAL VAGINAL DISCHARGE OR ITCHING   Items with * indicate a potential emergency and should be followed up as soon as possible or go to the  Emergency Department if any problems should occur.  Please show the CHEMOTHERAPY ALERT CARD or IMMUNOTHERAPY ALERT CARD at check-in to the Emergency Department and triage nurse.  Should you have questions after your visit or need to cancel or reschedule your appointment, please contact [facility] CENTER AT [facility] [phone]  and follow the prompts.  Office hours are 8:00 a.m. to 4:30 p.m. Monday - Friday. Please note that voicemails left after 4:00 p.m. may not be returned until the following business day.  We are closed weekends and major holidays. You have access to a nurse at all times for urgent questions. Please call the main number to the clinic [phone] and follow the prompts.  For any non-urgent questions, you may also contact your provider using MyChart. We now offer e-Visits for anyone 68 and older to request care online for non-urgent symptoms. For details visit [email or web address].   Also download the MyChart app! Go to the app store, search "MyChart", open the app, select [place], and log in with your MyChart username and password.

## 2022-09-23 NOTE — Progress Notes (Signed)
Patient presents today for 5FU pump stop and disconnection after 46 hour continous infusion.   5FU pump deaccessed.  Patients port flushed without difficulty.  Good blood return noted with no bruising or swelling noted at site.  Needle removed intact.  Band aid applied. Fulphila administration without incident; injection site WNL; see MAR for injection details.  Patient tolerated procedure well and without incident.  No questions or complaints noted at this time. VSS with discharge and left in satisfactory condition via wheelchair with no s/s of distress noted.    

## 2022-09-26 ENCOUNTER — Other Ambulatory Visit: Payer: Self-pay

## 2022-09-27 ENCOUNTER — Other Ambulatory Visit: Payer: Self-pay

## 2022-10-04 NOTE — Progress Notes (Signed)
Premedication updated to addition of:  Singulair 10mg  po x1 Dexamethasone increase to 20 mg IVPB Pepcid increase to 40 mg IVPB. Ativan 1 mg IV x 1   V.O. Dr Carilyn Goodpasture, PharmD

## 2022-10-04 NOTE — Progress Notes (Signed)
Decrease lorazepam to 0.5 mg IV x 1  V.O. Dr Carilyn Goodpasture, PharmD

## 2022-10-04 NOTE — Progress Notes (Signed)
[facility] [address], [place] [address]    Clinic Day:  10/04/2022  Referring physician: Babs Sciara, MD  Patient Care Team: Babs Sciara, MD as PCP - General (Family Medicine) [name], MD as Medical Oncologist (Medical Oncology)   ASSESSMENT & PLAN:   Assessment: 1.  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  Liver biopsy on 03/09/2018 consistent with adenocarcinoma. 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 -7 cycles of FOLFOX with vectibix completed on 06/27/2018. -XRT with Xeloda from 07/30/2018 through 09/06/2018. -Right liver lesion microwave ablation on 10/15/2018 at [facility]. -Low anterior resection and diverting ileostomy on 12/07/2018, pathology [id number], 0/6 lymph nodes positive, margins negative. -Loop ileostomy reversal on 04/17/2019. - Microwave ablation of the right hepatic lobe lesion by Dr. Selena Batten around 08/15/2019. -SBRT to the left lower lobe lung lesion and left liver lobe lesion on 12/13/2019 at [facility]. - Right upper lobe lesion SBRT from 08/06/2020 through 08/13/2020. - 3 left lung lesions and subcarinal lymph node IMRT from 09/21/2021 through 10/05/2021. - Liver biopsy (06/09/2022): Metastatic moderately differentiated colonic adenocarcinoma. - NGS: KRAS/NRAS/BRAF wild-type, MSI-stable, HER2-0, negative for other targetable mutations - Cycle 1 of FOLFOX and bevacizumab on 07/06/2022 - XRT to the right neck from 07/14/2022 through 08/03/2022    Plan: 1.  Stage IV rectal adenocarcinoma, BRAF/RAS wild-type, MSI-stable: - She had oxaliplatin reaction with cycle 4 on 09/07/2022.  She had to receive Solu-Medrol. - Reviewed labs today: Normal LFTs and creatinine.  CBC shows mild leukopenia with ANC of 1.6 which is gradually decreasing. - Recommend adding long-acting G-CSF on day 3. - We discussed that she does not have a lot of options if we lose  oxaliplatin.  Hence I have recommended that we premedicate her with steroids, H1 and H2 blockers and prolong the infusion time of oxaliplatin to 4 hours.  She is agreeable.  Will proceed with the plan.  RTC 2 weeks for follow-up.  She is planning on vacationing around 20 July.  We will push her cycle 6 to 3 weeks.   2.  Hyperbilirubinemia: - She has Gilbert's syndrome.  Total bilirubin is 4.1.   3.  Normocytic anemia: - Hemoglobin is 11.1.  This is stable.   4.  Leukopenia and thrombocytopenia: - Intermittent leukopenia and thrombocytopenia exacerbated by splenomegaly.   5.  Brain metastasis: - Brain MRI on 08/18/2022 with 3 mm nodular enhancing lesion in the anterior right frontal lobe. - She is status post SRS.   Addendum: 10 to 15 minutes after the oxaliplatin infusion started, she developed itching in her ear canal which was similar to reaction symptoms 2 weeks ago.  Oxaliplatin was held.  She developed erythema around her mouth and lower part of nose.  We have given another dose of Solu-Medrol 125 mg.  She also had developed some twitchings of her right leg.  This was followed by numbness in the hands which also resolved.  After the erythema subsided, we have restarted oxaliplatin.  She was also given Ativan 0.5 mg.  I have checked on her multiple times during the infusion.  She has successfully completed oxaliplatin or 4 hours.  Prior to next infusion, we plan to increase dexamethasone to 20 mg, still keep the Solu-Medrol 125 mg, increase the dose of Pepcid to 40 mg and add Ativan 0.5 mg to the premeds.  No orders of the defined types were placed in this encounter.  Alben Deeds Teague,acting as a Neurosurgeon for [name], MD.,have documented all relevant documentation on the behalf of [name], MD,as directed by  [name], MD while in the presence of [name], MD.  ***   Samantha Reynolds   7/9/20248:34 PM  CHIEF COMPLAINT:   Diagnosis:  metastatic rectal cancer to liver    Cancer Staging  Malignant neoplasm of rectum [facility]) Staging form: Colon and Rectum, AJCC 8th Edition - Clinical stage from 03/13/2018: Stage IVA (cT3, cN1b, cM1a) - Signed by [name], MD on 03/13/2018    Prior Therapy: 1. FOLFOX & vectibix x 7 cycles from 03/14/2018 to 06/27/2018. 2. XRT with Xeloda from 07/30/2018 to 09/06/2018. 3. Right liver lesion microwave ablation on 10/15/2018. 4. Low anterior resection and diverting ileostomy on 12/07/2018. 5. Right hepatic lobe microwave ablation on 08/15/19 6. SBRT to LLL and liver 11/15/2019 -12/13/2019. 7. SBRT to RUL 08/06/20 - 08/13/20 8. IMRT to 3 left lung lesions and subcarinal LN 09/21/21 - 10/05/21 9. XRT to C6 07/14/22 - 08/03/22  Current Therapy:  FOLFOX and bevacizumab    HISTORY OF PRESENT ILLNESS:   Oncology History  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HCC)  02/26/2018 Initial Diagnosis   Rectal cancer (HCC)   03/13/2018 Cancer Staging   Staging form: Colon and Rectum, AJCC 8th Edition - Clinical stage from 03/13/2018: Stage IVA (cT3, cN1b, cM1a) - Signed by [name], MD on 03/13/2018   03/14/2018 - 06/29/2018 Chemotherapy   The patient had palonosetron (ALOXI) injection 0.25 mg, 0.25 mg, Intravenous,  Once, 7 of 8 cycles Administration: 0.25 mg (03/14/2018), 0.25 mg (03/27/2018), 0.25 mg (04/18/2018), 0.25 mg (05/02/2018), 0.25 mg (05/16/2018), 0.25 mg (06/05/2018), 0.25 mg (06/27/2018) leucovorin 800 mg in dextrose 5 % 250 mL infusion, 772 mg, Intravenous,  Once, 7 of 8 cycles Administration: 800 mg (03/14/2018), 800 mg (03/27/2018), 800 mg (05/02/2018), 800 mg (05/16/2018), 700 mg (04/18/2018), 800 mg (06/05/2018), 800 mg (06/27/2018) oxaliplatin (ELOXATIN) 165 mg in dextrose 5 % 500 mL chemo infusion, 85 mg/m2 = 165 mg, Intravenous,  Once, 6 of 7 cycles Dose modification: 68 mg/m2 (80 % of original dose 85 mg/m2, Cycle 4, Reason: Provider Judgment) Administration: 165 mg (03/14/2018),  165 mg (03/27/2018), 130 mg (05/02/2018), 130 mg (05/16/2018), 130 mg (06/05/2018), 130 mg (06/27/2018) panitumumab (VECTIBIX) 500 mg in sodium chloride 0.9 % 100 mL chemo infusion, 480 mg, Intravenous,  Once, 4 of 5 cycles Administration: 500 mg (04/18/2018), 480 mg (05/02/2018), 480 mg (05/16/2018), 480 mg (06/05/2018) fluorouracil (ADRUCIL) chemo injection 750 mg, 400 mg/m2 = 750 mg (100 % of original dose 400 mg/m2), Intravenous,  Once, 6 of 7 cycles Dose modification: 400 mg/m2 (original dose 400 mg/m2, Cycle 1), 400 mg/m2 (original dose 400 mg/m2, Cycle 3) Administration: 750 mg (03/14/2018), 750 mg (04/18/2018), 750 mg (05/02/2018), 750 mg (05/16/2018), 750 mg (06/05/2018), 750 mg (06/27/2018) fosaprepitant (EMEND) 150 mg, dexamethasone (DECADRON) 12 mg in sodium chloride 0.9 % 145 mL IVPB, , Intravenous,  Once, 4 of 5 cycles Administration:  (05/02/2018),  (05/16/2018),  (06/05/2018),  (06/27/2018) fluorouracil (ADRUCIL) 4,650 mg in sodium chloride 0.9 % 57 mL chemo infusion, 2,400 mg/m2 = 4,650 mg, Intravenous, 1 Day/Dose, 7 of 8 cycles Administration: 4,650 mg (03/14/2018), 4,650 mg (03/27/2018), 4,650 mg (04/18/2018), 4,650 mg (05/02/2018), 4,650 mg (05/16/2018), 4,650 mg (06/05/2018), 4,650 mg (06/27/2018)  for chemotherapy treatment.    07/06/2022 -  Chemotherapy   Patient is on Treatment Plan : COLORECTAL FOLFOX + Bevacizumab q14d  INTERVAL HISTORY:   Samantha Reynolds is a 57 y.o. female presenting to clinic today for follow up of metastatic rectal cancer to liver. She was last seen by me on 09/21/22.  Today, she states that she is doing well overall. Her appetite level is at ***%. Her energy level is at ***%.  PAST MEDICAL HISTORY:   Past Medical History: Past Medical History:  Diagnosis Date   Anxiety    Cancer (HCC) 2013   thyroid   Endometrial polyp    Endometrial thickening on ultra sound    GERD (gastroesophageal reflux disease)    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   Worse in her 53s when she was ill    H/O Hashimoto thyroiditis    History of chemotherapy    History of radiation therapy    History of thyroid cancer no recurrence   2013--  s/p  left lobe thyroidectomy--  follicular varient papillary / lymphocyctic thyroiditis   Hyperlipidemia 11/09/2015   Hypothyroidism    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HCC) 02/26/2018    Surgical History: Past Surgical History:  Procedure Laterality Date   BIOPSY  02/19/2018   Procedure: BIOPSY;  Surgeon: Malissa Hippo, MD;  [facility];  Service: Endoscopy;;  rectum   COLONOSCOPY N/A 08/20/2015   Procedure: COLONOSCOPY;  Surgeon: Malissa Hippo, MD;  [facility];  Service: Endoscopy;  Laterality: N/A;  730   COLONOSCOPY WITH PROPOFOL N/A 07/21/2021   Procedure: COLONOSCOPY WITH PROPOFOL;  Surgeon: Malissa Hippo, MD;  [facility];  Service: Endoscopy;  Laterality: N/A;  10:10   D & C HYSTEROOSCOPY W/ THERMACHOICE ENDOMETRIAL ABLATION  08-13-2004   DIVERTING ILEOSTOMY N/A 12/07/2018   Procedure: DIVERTING LOOP ILEOSTOMY;  Surgeon: Romie Levee, MD;  Location: [facility];  Service: General;  Laterality: N/A;   FLEXIBLE SIGMOIDOSCOPY N/A 02/19/2018   Procedure: FLEXIBLE SIGMOIDOSCOPY;  Surgeon: Malissa Hippo, MD;  [facility];  Service: Endoscopy;  Laterality: N/A;   HYSTEROSCOPY WITH D & C N/A 04/30/2015   Procedure: DILATATION AND CURETTAGE / INTENDED HYSTEROSCOPY;  Surgeon: Marcelle Overlie, MD;  Location: [facility];  Service: Gynecology;  Laterality: N/A;   ILEOSTOMY CLOSURE N/A 04/17/2019   Procedure: LOOP ILEOSTOMY REVERSAL;  Surgeon: Romie Levee, MD;  Location: [facility];  Service: General;  Laterality: N/A;   IR US GUIDE BX ASP/DRAIN  03/09/2018   LAPAROSCOPIC CHOLECYSTECTOMY  04/1996   LAPAROSCOPY N/A 12/11/2018   Procedure: LAPAROSCOPY  ILEOSTOMY REVISION AND ABDOMINAL WASHOUT;  Surgeon: Romie Levee, MD;  Location: [facility];  Service: General;  Laterality: N/A;   Liver Microwave    10/17/2018   POLYPECTOMY  08/20/2015   Procedure: POLYPECTOMY;  Surgeon: Malissa Hippo, MD;  [facility];  Service: Endoscopy;;  Splenic flexure polypectomy   POLYPECTOMY  02/19/2018   Procedure: POLYPECTOMY;  Surgeon: Malissa Hippo, MD;  [facility];  Service: Endoscopy;;  rectum   PORTACATH PLACEMENT N/A 03/14/2018   Procedure: INSERTION PORT-A-CATH;  Surgeon: Franky Macho, MD;  Location: [facility];  Service: General;  Laterality: N/A;   REDUCTION INCARCERATED UTERUS  06-20-2000   intrauterine preg. 13 wks /  urinary retention   THYROID LOBECTOMY  11/24/2011   Procedure: THYROID LOBECTOMY;  Surgeon: Velora Heckler, MD;  Location: [facility];  Service: General;  Laterality: Left;  Left Thyroid Lobectomy   TUBAL LIGATION  2002   XI ROBOTIC ASSISTED LOWER ANTERIOR RESECTION N/A 12/07/2018   Procedure: XI  ROBOTIC ASSISTED LOWER ANTERIOR RESECTION, SPENIC FLEXURE IMMOBILIZATION, COLOANAL ANASTOMOSIS, RIGID PROCTOSCOPY;  Surgeon: Romie Levee, MD;  Location: [facility];  Service: General;  Laterality: N/A;    Social History: Social History   Socioeconomic History   Marital status: Married    Spouse name: Not on file   Number of children: 4   Years of education: Not on file   Highest education level: Not on file  Occupational History    Comment: Systems developer at [employer]  Tobacco Use   Smoking status: Former    Packs/day: 0.25    Years: 10.00    Additional pack years: 0.00    Total pack years: 2.50    Types: Cigarettes    Quit date: 10/31/1991    Years since quitting: 30.9   Smokeless tobacco: Never  Vaping Use   Vaping Use: Never used  Substance and Sexual Activity   Alcohol use: No   Drug use: No   Sexual activity: Not Currently  Other Topics Concern   Not on file  Social History Narrative   Lives with husband Loraine Leriche and 40 year old son Almeta Monas   Husband is Technical brewer of [employer]   Social Determinants of Health   Financial Resource Strain: Low Risk   (02/26/2018)   Overall Financial Resource Strain (CARDIA)    Difficulty of Paying Living Expenses: Not hard at all  Food Insecurity: No Food Insecurity (02/26/2018)   Hunger Vital Sign    Worried About Running Out of Food in the Last Year: Never true    Ran Out of Food in the Last Year: Never true  Transportation Needs: No Transportation Needs (02/26/2018)   PRAPARE - Administrator, Civil Service (Medical): No    Lack of Transportation (Non-Medical): No  Physical Activity: Inactive (02/26/2018)   Exercise Vital Sign    Days of Exercise per Week: 0 days    Minutes of Exercise per Session: 0 min  Stress: Stress Concern Present (02/26/2018)   [employer] of Occupational Health - Occupational Stress Questionnaire    Feeling of Stress : To some extent  Social Connections: Socially Integrated (02/26/2018)   Social Connection and Isolation Panel [NHANES]    Frequency of Communication with Friends and Family: More than three times a week    Frequency of Social Gatherings with Friends and Family: Twice a week    Attends Religious Services: More than 4 times per year    Active Member of [employer] or Organizations: Yes    Attends Engineer, structural: More than 4 times per year    Marital Status: Married  Catering manager Violence: Not At Risk (02/26/2018)   Humiliation, Afraid, Rape, and Kick questionnaire    Fear of Current or Ex-Partner: No    Emotionally Abused: No    Physically Abused: No    Sexually Abused: No    Family History: Family History  Problem Relation Age of Onset   Hypertension Mother    Hypertension Father    Prostate cancer Father    Cancer Maternal Aunt        spine/back   Cancer Paternal Grandfather        lung   Healthy Son    Healthy Son    Healthy Son    Healthy Son    Colon cancer Neg Hx     Current Medications:  Current Outpatient Medications:    ALPRAZolam (XANAX) 0.5 MG tablet, Take 1 tablet (0.5 mg total) by mouth  3 (three) times  daily as needed for anxiety., Disp: 90 tablet, Rfl: 5   amitriptyline (ELAVIL) 10 MG tablet, Take 1 tablet (10 mg total) by mouth 2 (two) times daily., Disp: 180 tablet, Rfl: 3   ARMOUR THYROID 60 MG tablet, Take 1 tablet (60 mg total) by mouth daily., Disp: 90 tablet, Rfl: 1   ARMOUR THYROID 60 MG tablet, Take 1 tablet (60 mg total) by mouth daily., Disp: 90 tablet, Rfl: 1   dexamethasone (DECADRON) 2 MG tablet, Take 1 tablet (2 mg total) by mouth 2 (two) times daily., Disp: 60 tablet, Rfl: 1   loperamide (IMODIUM) 2 MG capsule, Take 1 capsule (2 mg total) by mouth 3 (three) times daily. (Patient taking differently: Take 2 mg by mouth daily.), Disp: 30 capsule, Rfl: 0   pregabalin (LYRICA) 25 MG capsule, Take 1 capsule (25 mg total) by mouth 2 (two) times daily., Disp: 60 capsule, Rfl: 3   prochlorperazine (COMPAZINE) 10 MG tablet, Take 1 tablet (10 mg total) by mouth every 6 (six) hours as needed for nausea or vomiting., Disp: 30 tablet, Rfl: 4   sucralfate (CARAFATE) 1 g tablet, Take 1 tablet (1 g total) by mouth 4 (four) times daily -  with meals and at bedtime. Crush 1 tablet in 1 oz water and drink 5 min before meals for radiation induced esophagitis, Disp: 120 tablet, Rfl: 2   tiZANidine (ZANAFLEX) 4 MG tablet, Take 1 tablet (4 mg total) by mouth every 6 (six) hours as needed for muscle spasms (take for tightness and pain in the scapular region)., Disp: 56 tablet, Rfl: 2 No current facility-administered medications for this visit.  Facility-Administered Medications Ordered in Other Visits:    clindamycin (CLEOCIN) 900 mg in dextrose 5 % 50 mL IVPB, 900 mg, Intravenous, 60 min Pre-Op **AND** gentamicin (GARAMYCIN) 350 mg in dextrose 5 % 50 mL IVPB, 5 mg/kg, Intravenous, 60 min Pre-Op, Romie Levee, MD   clindamycin (CLEOCIN) 900 mg in dextrose 5 % 50 mL IVPB, 900 mg, Intravenous, 60 min Pre-Op **AND** gentamicin (GARAMYCIN) 5 mg/kg in dextrose 5 % 50 mL IVPB, 5 mg/kg, Intravenous, 60 min  Pre-Op, Romie Levee, MD   sodium chloride 0.9 % 1,000 mL with potassium chloride 20 mEq, magnesium sulfate 2 g infusion, , Intravenous, Once, [name], MD   Allergies: Allergies  Allergen Reactions   Oxaliplatin Other (See Comments)    Patient had hypersensitivity reaction to Oxaliplatin. Patient c/o itching in her ears and eyes. Pt also c/o being hot, flushed, dizzy with redness on pt's face, arms, and legs. Pt c/o tingling in her hands and legs. Pt also had a dry throat with coughing. See progress note from 09/07/22 at 1130. Patient was not able to complete Oxaliplatin infusion.    Demerol [Meperidine] Itching    All over the body.   Penicillins Hives     childhood, does not remember if it spread all over the body or not. Did it involve swelling of the face/tongue/throat, SOB, or low BP? No Did it involve sudden or severe rash/hives, skin peeling, or any reaction on the inside of your mouth or nose? Yes Did you need to seek medical attention at a hospital or doctor's office? Yes When did it last happen?      childhood allergy If all above answers are "NO", may proceed with cephalosporin use.    Levaquin [Levofloxacin] Other (See Comments)    Patient has Aortic Aneurysm and is not indicated with this diagnosis  Other Hives and Other (See Comments)    Fresh coconut    Vancomycin Rash    Will need Benadryl prior to administration per IV. "Red man syndrome"    REVIEW OF SYSTEMS:   Review of Systems  Constitutional:  Negative for chills, fatigue and fever.  HENT:   Negative for lump/mass, mouth sores, nosebleeds, sore throat and trouble swallowing.   Eyes:  Negative for eye problems.  Respiratory:  Negative for cough and shortness of breath.   Cardiovascular:  Negative for chest pain, leg swelling and palpitations.  Gastrointestinal:  Negative for abdominal pain, constipation, diarrhea, nausea and vomiting.  Genitourinary:  Negative for bladder incontinence, difficulty  urinating, dysuria, frequency, hematuria and nocturia.   Musculoskeletal:  Negative for arthralgias, back pain, flank pain, myalgias and neck pain.  Skin:  Negative for itching and rash.  Neurological:  Negative for dizziness, headaches and numbness.  Hematological:  Does not bruise/bleed easily.  Psychiatric/Behavioral:  Negative for depression, sleep disturbance and suicidal ideas. The patient is not nervous/anxious.   All other systems reviewed and are negative.    VITALS:   Last menstrual period 02/16/2015.  Wt Readings from Last 3 Encounters:  09/21/22 137 lb 9.6 oz (62.4 kg)  09/13/22 136 lb 8 oz (61.9 kg)  09/07/22 138 lb (62.6 kg)    There is no height or weight on file to calculate BMI.  Performance status (ECOG): 1 - Symptomatic but completely ambulatory  PHYSICAL EXAM:   Physical Exam Vitals and nursing note reviewed. Exam conducted with a chaperone present.  Constitutional:      Appearance: Normal appearance.  Cardiovascular:     Rate and Rhythm: Normal rate and regular rhythm.     Pulses: Normal pulses.     Heart sounds: Normal heart sounds.  Pulmonary:     Effort: Pulmonary effort is normal.     Breath sounds: Normal breath sounds.  Abdominal:     Palpations: Abdomen is soft. There is no hepatomegaly, splenomegaly or mass.     Tenderness: There is no abdominal tenderness.  Musculoskeletal:     Right lower leg: No edema.     Left lower leg: No edema.  Lymphadenopathy:     Cervical: No cervical adenopathy.     Right cervical: No superficial, deep or posterior cervical adenopathy.    Left cervical: No superficial, deep or posterior cervical adenopathy.     Upper Body:     Right upper body: No supraclavicular or axillary adenopathy.     Left upper body: No supraclavicular or axillary adenopathy.  Neurological:     General: No focal deficit present.     Mental Status: She is alert and oriented to person, place, and time.  Psychiatric:        Mood and  Affect: Mood normal.        Behavior: Behavior normal.     LABS:      Latest Ref Rng & Units 09/21/2022    7:54 AM 09/07/2022    8:36 AM 08/24/2022    8:33 AM  CBC  WBC 4.0 - 10.5 K/uL 2.5  2.7  2.9   Hemoglobin 12.0 - 15.0 g/dL 95.6  38.7  56.4   Hematocrit 36.0 - 46.0 % 32.5  29.5  30.5   Platelets 150 - 400 K/uL 106  106  124       Latest Ref Rng & Units 09/21/2022    7:54 AM 09/07/2022    8:36 AM 08/24/2022  8:33 AM  CMP  Glucose 70 - 99 mg/dL 99  89  98   BUN 6 - 20 mg/dL 12  11  8    Creatinine 0.44 - 1.00 mg/dL 8.29  5.62  1.30   Sodium 135 - 145 mmol/L 135  135  134   Potassium 3.5 - 5.1 mmol/L 3.4  3.6  3.2   Chloride 98 - 111 mmol/L 104  104  102   CO2 22 - 32 mmol/L 22  23  20    Calcium 8.9 - 10.3 mg/dL 8.7  8.5  8.4   Total Protein 6.5 - 8.1 g/dL 7.0  6.5  6.8   Total Bilirubin 0.3 - 1.2 mg/dL 4.1  3.0  5.4   Alkaline Phos 38 - 126 U/L 58  61  70   AST 15 - 41 U/L 21  18  24    ALT 0 - 44 U/L 10  10  20       Lab Results  Component Value Date   CEA1 7.1 (H) 08/24/2022   /  CEA  Date Value Ref Range Status  08/24/2022 7.1 (H) 0.0 - 4.7 ng/mL Final    Comment:    (NOTE)                             Nonsmokers          <3.9                             Smokers             <5.6 Roche [employer] Electrochemiluminescence Immunoassay (ECLIA) Values obtained with different assay methods or kits cannot be used interchangeably.  Results cannot be interpreted as absolute evidence of the presence or absence of malignant disease. Performed At: [facility] [address] [place], [place] [id number] Jolene Schimke MD EX:[id number]    No results found for: "PSA1" No results found for: "CAN199" No results found for: "CAN125"  No results found for: "TOTALPROTELP", "ALBUMINELP", "A1GS", "A2GS", "BETS", "BETA2SER", "GAMS", "MSPIKE", "SPEI" Lab Results  Component Value Date   TIBC 333 09/07/2022   TIBC 364 05/11/2022   TIBC 385 11/09/2020   FERRITIN 89  09/07/2022   FERRITIN 61 05/11/2022   FERRITIN 54 11/09/2020   IRONPCTSAT 36 (H) 09/07/2022   IRONPCTSAT 26 05/11/2022   IRONPCTSAT 22 11/09/2020   Lab Results  Component Value Date   LDH 154 01/20/2020     STUDIES:   No results found.

## 2022-10-05 ENCOUNTER — Inpatient Hospital Stay (HOSPITAL_BASED_OUTPATIENT_CLINIC_OR_DEPARTMENT_OTHER): Payer: 59 | Admitting: Hematology

## 2022-10-05 ENCOUNTER — Encounter: Payer: Self-pay | Admitting: Hematology

## 2022-10-05 ENCOUNTER — Encounter (HOSPITAL_COMMUNITY): Payer: Self-pay | Admitting: Hematology

## 2022-10-05 ENCOUNTER — Inpatient Hospital Stay: Payer: 59

## 2022-10-05 ENCOUNTER — Inpatient Hospital Stay: Payer: 59 | Attending: Hematology

## 2022-10-05 VITALS — BP 106/85 | HR 109 | Temp 98.6°F | Resp 18 | Wt 136.0 lb

## 2022-10-05 VITALS — BP 100/69 | HR 86 | Temp 97.8°F | Resp 18

## 2022-10-05 DIAGNOSIS — R232 Flushing: Secondary | ICD-10-CM | POA: Diagnosis not present

## 2022-10-05 DIAGNOSIS — C7931 Secondary malignant neoplasm of brain: Secondary | ICD-10-CM | POA: Insufficient documentation

## 2022-10-05 DIAGNOSIS — Z79631 Long term (current) use of antimetabolite agent: Secondary | ICD-10-CM | POA: Insufficient documentation

## 2022-10-05 DIAGNOSIS — R110 Nausea: Secondary | ICD-10-CM | POA: Insufficient documentation

## 2022-10-05 DIAGNOSIS — D696 Thrombocytopenia, unspecified: Secondary | ICD-10-CM | POA: Diagnosis not present

## 2022-10-05 DIAGNOSIS — Z79630 Long term (current) use of alkylating agent: Secondary | ICD-10-CM | POA: Insufficient documentation

## 2022-10-05 DIAGNOSIS — D72819 Decreased white blood cell count, unspecified: Secondary | ICD-10-CM | POA: Insufficient documentation

## 2022-10-05 DIAGNOSIS — Z923 Personal history of irradiation: Secondary | ICD-10-CM | POA: Diagnosis not present

## 2022-10-05 DIAGNOSIS — R197 Diarrhea, unspecified: Secondary | ICD-10-CM | POA: Insufficient documentation

## 2022-10-05 DIAGNOSIS — Z87891 Personal history of nicotine dependence: Secondary | ICD-10-CM | POA: Insufficient documentation

## 2022-10-05 DIAGNOSIS — Z801 Family history of malignant neoplasm of trachea, bronchus and lung: Secondary | ICD-10-CM | POA: Insufficient documentation

## 2022-10-05 DIAGNOSIS — Z881 Allergy status to other antibiotic agents status: Secondary | ICD-10-CM | POA: Diagnosis not present

## 2022-10-05 DIAGNOSIS — Z9049 Acquired absence of other specified parts of digestive tract: Secondary | ICD-10-CM | POA: Insufficient documentation

## 2022-10-05 DIAGNOSIS — D649 Anemia, unspecified: Secondary | ICD-10-CM | POA: Diagnosis not present

## 2022-10-05 DIAGNOSIS — K5900 Constipation, unspecified: Secondary | ICD-10-CM | POA: Insufficient documentation

## 2022-10-05 DIAGNOSIS — C20 Malignant neoplasm of rectum: Secondary | ICD-10-CM | POA: Diagnosis not present

## 2022-10-05 DIAGNOSIS — C19 Malignant neoplasm of rectosigmoid junction: Secondary | ICD-10-CM

## 2022-10-05 DIAGNOSIS — Z9221 Personal history of antineoplastic chemotherapy: Secondary | ICD-10-CM | POA: Insufficient documentation

## 2022-10-05 DIAGNOSIS — C787 Secondary malignant neoplasm of liver and intrahepatic bile duct: Secondary | ICD-10-CM | POA: Diagnosis not present

## 2022-10-05 DIAGNOSIS — R161 Splenomegaly, not elsewhere classified: Secondary | ICD-10-CM | POA: Diagnosis not present

## 2022-10-05 DIAGNOSIS — Z8249 Family history of ischemic heart disease and other diseases of the circulatory system: Secondary | ICD-10-CM | POA: Insufficient documentation

## 2022-10-05 DIAGNOSIS — Z5112 Encounter for antineoplastic immunotherapy: Secondary | ICD-10-CM | POA: Diagnosis present

## 2022-10-05 DIAGNOSIS — Z5111 Encounter for antineoplastic chemotherapy: Secondary | ICD-10-CM | POA: Diagnosis present

## 2022-10-05 DIAGNOSIS — Z79899 Other long term (current) drug therapy: Secondary | ICD-10-CM | POA: Diagnosis not present

## 2022-10-05 DIAGNOSIS — Z808 Family history of malignant neoplasm of other organs or systems: Secondary | ICD-10-CM | POA: Insufficient documentation

## 2022-10-05 DIAGNOSIS — Z885 Allergy status to narcotic agent status: Secondary | ICD-10-CM | POA: Diagnosis not present

## 2022-10-05 DIAGNOSIS — Z8042 Family history of malignant neoplasm of prostate: Secondary | ICD-10-CM | POA: Insufficient documentation

## 2022-10-05 LAB — URINALYSIS, DIPSTICK ONLY
Bilirubin Urine: NEGATIVE
Glucose, UA: NEGATIVE mg/dL
Hgb urine dipstick: NEGATIVE
Ketones, ur: NEGATIVE mg/dL
Nitrite: NEGATIVE
Protein, ur: NEGATIVE mg/dL
Specific Gravity, Urine: 1.015 (ref 1.005–1.030)
pH: 5 (ref 5.0–8.0)

## 2022-10-05 LAB — CBC WITH DIFFERENTIAL/PLATELET
Abs Immature Granulocytes: 0.07 10*3/uL (ref 0.00–0.07)
Basophils Absolute: 0 10*3/uL (ref 0.0–0.1)
Basophils Relative: 0 %
Eosinophils Absolute: 0 10*3/uL (ref 0.0–0.5)
Eosinophils Relative: 1 %
HCT: 31.7 % — ABNORMAL LOW (ref 36.0–46.0)
Hemoglobin: 10.6 g/dL — ABNORMAL LOW (ref 12.0–15.0)
Immature Granulocytes: 2 %
Lymphocytes Relative: 18 %
Lymphs Abs: 0.5 10*3/uL — ABNORMAL LOW (ref 0.7–4.0)
MCH: 36.3 pg — ABNORMAL HIGH (ref 26.0–34.0)
MCHC: 33.4 g/dL (ref 30.0–36.0)
MCV: 108.6 fL — ABNORMAL HIGH (ref 80.0–100.0)
Monocytes Absolute: 0.2 10*3/uL (ref 0.1–1.0)
Monocytes Relative: 6 %
Neutro Abs: 2.2 10*3/uL (ref 1.7–7.7)
Neutrophils Relative %: 73 %
Platelets: 61 10*3/uL — ABNORMAL LOW (ref 150–400)
RBC: 2.92 MIL/uL — ABNORMAL LOW (ref 3.87–5.11)
RDW: 14.8 % (ref 11.5–15.5)
WBC: 3 10*3/uL — ABNORMAL LOW (ref 4.0–10.5)
nRBC: 0 % (ref 0.0–0.2)

## 2022-10-05 LAB — COMPREHENSIVE METABOLIC PANEL
ALT: 13 U/L (ref 0–44)
AST: 20 U/L (ref 15–41)
Albumin: 3.8 g/dL (ref 3.5–5.0)
Alkaline Phosphatase: 65 U/L (ref 38–126)
Anion gap: 8 (ref 5–15)
BUN: 12 mg/dL (ref 6–20)
CO2: 23 mmol/L (ref 22–32)
Calcium: 8.6 mg/dL — ABNORMAL LOW (ref 8.9–10.3)
Chloride: 104 mmol/L (ref 98–111)
Creatinine, Ser: 0.93 mg/dL (ref 0.44–1.00)
GFR, Estimated: >60 mL/min (ref >60)
Glucose, Bld: 87 mg/dL (ref 70–99)
Potassium: 3.5 mmol/L (ref 3.5–5.1)
Sodium: 135 mmol/L (ref 135–145)
Total Bilirubin: 2.8 mg/dL — ABNORMAL HIGH (ref 0.3–1.2)
Total Protein: 6.7 g/dL (ref 6.5–8.1)

## 2022-10-05 LAB — TSH: TSH: 4.867 u[IU]/mL — ABNORMAL HIGH (ref 0.350–4.500)

## 2022-10-05 LAB — MAGNESIUM: Magnesium: 2.3 mg/dL (ref 1.7–2.4)

## 2022-10-05 LAB — T4, FREE: Free T4: 0.69 ng/dL (ref 0.61–1.12)

## 2022-10-05 MED ORDER — PALONOSETRON HCL INJECTION 0.25 MG/5ML
0.2500 mg | Freq: Once | INTRAVENOUS | Status: AC
  Administered 2022-10-05: 0.25 mg via INTRAVENOUS
  Filled 2022-10-05: qty 5

## 2022-10-05 MED ORDER — SODIUM CHLORIDE 0.9 % IV SOLN
5.0000 mg/kg | Freq: Once | INTRAVENOUS | Status: AC
  Administered 2022-10-05: 300 mg via INTRAVENOUS
  Filled 2022-10-05: qty 12

## 2022-10-05 MED ORDER — SODIUM CHLORIDE 0.9 % IV SOLN
10.0000 mg | Freq: Once | INTRAVENOUS | Status: AC
  Administered 2022-10-05: 10 mg via INTRAVENOUS
  Filled 2022-10-05: qty 10

## 2022-10-05 MED ORDER — SODIUM CHLORIDE 0.9% FLUSH
10.0000 mL | Freq: Once | INTRAVENOUS | Status: AC
  Administered 2022-10-05: 10 mL via INTRAVENOUS

## 2022-10-05 MED ORDER — SODIUM CHLORIDE 0.9 % IV SOLN
1920.0000 mg/m2 | INTRAVENOUS | Status: DC
  Administered 2022-10-05: 3500 mg via INTRAVENOUS
  Filled 2022-10-05: qty 70

## 2022-10-05 MED ORDER — SODIUM CHLORIDE 0.9 % IV SOLN
320.0000 mg/m2 | Freq: Once | INTRAVENOUS | Status: AC
  Administered 2022-10-05: 540 mg via INTRAVENOUS
  Filled 2022-10-05: qty 27

## 2022-10-05 MED ORDER — FLUOROURACIL CHEMO INJECTION 500 MG/10ML
320.0000 mg/m2 | Freq: Once | INTRAVENOUS | Status: AC
  Administered 2022-10-05: 500 mg via INTRAVENOUS
  Filled 2022-10-05: qty 10

## 2022-10-05 MED ORDER — SODIUM CHLORIDE 0.9 % IV SOLN: Freq: Once | INTRAVENOUS | Status: AC

## 2022-10-05 NOTE — Progress Notes (Signed)
Patient presents today for chemotherapy infusion. Patient is in satisfactory condition with no new complaints voiced.  Vital signs are stable.  Labs reviewed by Dr. Ellin Saba during the office visit.  Urine protein is negative today.  Bilirubin today is 2.8 and platelets are 61.  All other labs are within treatment parameters.  We will hold Oxaliplatin today per Dr. Ellin Saba due to low platelets.  We will proceed with treatment per MD orders.   Patient tolerated treatment well with no complaints voiced.  Home infusion 5FU pump connected with no issues.  Patient left ambulatory with husband in stable condition.  Vital signs stable at discharge.  Follow up as scheduled.

## 2022-10-05 NOTE — Patient Instructions (Signed)
[facility]  Discharge Instructions: Thank you for choosing [facility] to provide your oncology and hematology care.  If you have a lab appointment with the Cancer Center - please note that after April 8th, 2024, all labs will be drawn in the cancer center.  You do not have to check in or register with the main entrance as you have in the past but will complete your check-in in the cancer center.  Wear comfortable clothing and clothing appropriate for easy access to any Portacath or PICC line.   We strive to give you quality time with your provider. You may need to reschedule your appointment if you arrive late (15 or more minutes).  Arriving late affects you and other patients whose appointments are after yours.  Also, if you miss three or more appointments without notifying the office, you may be dismissed from the clinic at the provider's discretion.      For prescription refill requests, have your pharmacy contact our office and allow 72 hours for refills to be completed.    Today you received the following chemotherapy and/or immunotherapy agents Bevacizumab/Leucovorin/Fluorouracil.  Bevacizumab Injection What is this medication? BEVACIZUMAB (be va SIZ yoo mab) treats some types of cancer. It works by blocking a protein that causes cancer cells to grow and multiply. This helps to slow or stop the spread of cancer cells. It is a monoclonal antibody. This medicine may be used for other purposes; ask your health care provider or pharmacist if you have questions. COMMON BRAND NAME(S): Alymsys, Avastin, MVASI, Omer Jack What should I tell my care team before I take this medication? They need to know if you have any of these conditions: Blood clots Coughing up blood Having or recent surgery Heart failure High blood pressure History of a connection between 2 or more body parts that do not usually connect (fistula) History of a tear in your stomach or  intestines Protein in your urine An unusual or allergic reaction to bevacizumab, other medications, foods, dyes, or preservatives Pregnant or trying to get pregnant Breast-feeding How should I use this medication? This medication is injected into a vein. It is given by your care team in a hospital or clinic setting. Talk to your care team the use of this medication in children. Special care may be needed. Overdosage: If you think you have taken too much of this medicine contact a poison control center or emergency room at once. NOTE: This medicine is only for you. Do not share this medicine with others. What if I miss a dose? Keep appointments for follow-up doses. It is important not to miss your dose. Call your care team if you are unable to keep an appointment. What may interact with this medication? Interactions are not expected. This list may not describe all possible interactions. Give your health care provider a list of all the medicines, herbs, non-prescription drugs, or dietary supplements you use. Also tell them if you smoke, drink alcohol, or use illegal drugs. Some items may interact with your medicine. What should I watch for while using this medication? Your condition will be monitored carefully while you are receiving this medication. You may need blood work while taking this medication. This medication may make you feel generally unwell. This is not uncommon as chemotherapy can affect healthy cells as well as cancer cells. Report any side effects. Continue your course of treatment even though you feel ill unless your care team tells you to stop. This  medication may increase your risk to bruise or bleed. Call your care team if you notice any unusual bleeding. Before having surgery, talk to your care team to make sure it is ok. This medication can increase the risk of poor healing of your surgical site or wound. You will need to stop this medication for 28 days before surgery. After  surgery, wait at least 28 days before restarting this medication. Make sure the surgical site or wound is healed enough before restarting this medication. Talk to your care team if questions. Talk to your care team if you may be pregnant. Serious birth defects can occur if you take this medication during pregnancy and for 6 months after the last dose. Contraception is recommended while taking this medication and for 6 months after the last dose. Your care team can help you find the option that works for you. Do not breastfeed while taking this medication and for 6 months after the last dose. This medication can cause infertility. Talk to your care team if you are concerned about your fertility. What side effects may I notice from receiving this medication? Side effects that you should report to your care team as soon as possible: Allergic reactions--skin rash, itching, hives, swelling of the face, lips, tongue, or throat Bleeding--bloody or black, tar-like stools, vomiting blood or Tama Grosz material that looks like coffee grounds, red or dark Suhail Peloquin urine, small red or purple spots on skin, unusual bruising or bleeding Blood clot--pain, swelling, or warmth in the leg, shortness of breath, chest pain Heart attack--pain or tightness in the chest, shoulders, arms, or jaw, nausea, shortness of breath, cold or clammy skin, feeling faint or lightheaded Heart failure--shortness of breath, swelling of the ankles, feet, or hands, sudden weight gain, unusual weakness or fatigue Increase in blood pressure Infection--fever, chills, cough, sore throat, wounds that don't heal, pain or trouble when passing urine, general feeling of discomfort or being unwell Infusion reactions--chest pain, shortness of breath or trouble breathing, feeling faint or lightheaded Kidney injury--decrease in the amount of urine, swelling of the ankles, hands, or feet Stomach pain that is severe, does not go away, or gets worse Stroke--sudden  numbness or weakness of the face, arm, or leg, trouble speaking, confusion, trouble walking, loss of balance or coordination, dizziness, severe headache, change in vision Sudden and severe headache, confusion, change in vision, seizures, which may be signs of posterior reversible encephalopathy syndrome (PRES) Side effects that usually do not require medical attention (report to your care team if they continue or are bothersome): Back pain Change in taste Diarrhea Dry skin Increased tears Nosebleed This list may not describe all possible side effects. Call your doctor for medical advice about side effects. You may report side effects to FDA at 1-800-FDA-1088. Where should I keep my medication? This medication is given in a hospital or clinic. It will not be stored at home. NOTE: This sheet is a summary. It may not cover all possible information. If you have questions about this medicine, talk to your doctor, pharmacist, or health care provider.  2024 Elsevier/Gold Standard (2021-07-30 00:00:00)    Leucovorin Injection What is this medication? LEUCOVORIN (loo koe VOR in) prevents side effects from certain medications, such as methotrexate. It works by increasing folate levels. This helps protect healthy cells in your body. It may also be used to treat anemia caused by low levels of folate. It can also be used with fluorouracil, a type of chemotherapy, to treat colorectal cancer. It works by  increasing the effects of fluorouracil in the body. This medicine may be used for other purposes; ask your health care provider or pharmacist if you have questions. What should I tell my care team before I take this medication? They need to know if you have any of these conditions: Anemia from low levels of vitamin B12 in the blood An unusual or allergic reaction to leucovorin, folic acid, other medications, foods, dyes, or preservatives Pregnant or trying to get pregnant Breastfeeding How should I use  this medication? This medication is injected into a vein or a muscle. It is given by your care team in a hospital or clinic setting. Talk to your care team about the use of this medication in children. Special care may be needed. Overdosage: If you think you have taken too much of this medicine contact a poison control center or emergency room at once. NOTE: This medicine is only for you. Do not share this medicine with others. What if I miss a dose? Keep appointments for follow-up doses. It is important not to miss your dose. Call your care team if you are unable to keep an appointment. What may interact with this medication? Capecitabine Fluorouracil Phenobarbital Phenytoin Primidone Trimethoprim;sulfamethoxazole This list may not describe all possible interactions. Give your health care provider a list of all the medicines, herbs, non-prescription drugs, or dietary supplements you use. Also tell them if you smoke, drink alcohol, or use illegal drugs. Some items may interact with your medicine. What should I watch for while using this medication? Your condition will be monitored carefully while you are receiving this medication. This medication may increase the side effects of 5-fluorouracil. Tell your care team if you have diarrhea or mouth sores that do not get better or that get worse. What side effects may I notice from receiving this medication? Side effects that you should report to your care team as soon as possible: Allergic reactions--skin rash, itching, hives, swelling of the face, lips, tongue, or throat This list may not describe all possible side effects. Call your doctor for medical advice about side effects. You may report side effects to FDA at 1-800-FDA-1088. Where should I keep my medication? This medication is given in a hospital or clinic. It will not be stored at home. NOTE: This sheet is a summary. It may not cover all possible information. If you have questions about  this medicine, talk to your doctor, pharmacist, or health care provider.  2024 Elsevier/Gold Standard (2021-08-17 00:00:00)    Fluorouracil Injection What is this medication? FLUOROURACIL (flure oh YOOR a sil) treats some types of cancer. It works by slowing down the growth of cancer cells. This medicine may be used for other purposes; ask your health care provider or pharmacist if you have questions. COMMON BRAND NAME(S): Adrucil What should I tell my care team before I take this medication? They need to know if you have any of these conditions: Blood disorders Dihydropyrimidine dehydrogenase (DPD) deficiency Infection, such as chickenpox, cold sores, herpes Kidney disease Liver disease Poor nutrition Recent or ongoing radiation therapy An unusual or allergic reaction to fluorouracil, other medications, foods, dyes, or preservatives If you or your partner are pregnant or trying to get pregnant Breast-feeding How should I use this medication? This medication is injected into a vein. It is administered by your care team in a hospital or clinic setting. Talk to your care team about the use of this medication in children. Special care may be needed. Overdosage: If you  think you have taken too much of this medicine contact a poison control center or emergency room at once. NOTE: This medicine is only for you. Do not share this medicine with others. What if I miss a dose? Keep appointments for follow-up doses. It is important not to miss your dose. Call your care team if you are unable to keep an appointment. What may interact with this medication? Do not take this medication with any of the following: Live virus vaccines This medication may also interact with the following: Medications that treat or prevent blood clots, such as warfarin, enoxaparin, dalteparin This list may not describe all possible interactions. Give your health care provider a list of all the medicines, herbs,  non-prescription drugs, or dietary supplements you use. Also tell them if you smoke, drink alcohol, or use illegal drugs. Some items may interact with your medicine. What should I watch for while using this medication? Your condition will be monitored carefully while you are receiving this medication. This medication may make you feel generally unwell. This is not uncommon as chemotherapy can affect healthy cells as well as cancer cells. Report any side effects. Continue your course of treatment even though you feel ill unless your care team tells you to stop. In some cases, you may be given additional medications to help with side effects. Follow all directions for their use. This medication may increase your risk of getting an infection. Call your care team for advice if you get a fever, chills, sore throat, or other symptoms of a cold or flu. Do not treat yourself. Try to avoid being around people who are sick. This medication may increase your risk to bruise or bleed. Call your care team if you notice any unusual bleeding. Be careful brushing or flossing your teeth or using a toothpick because you may get an infection or bleed more easily. If you have any dental work done, tell your dentist you are receiving this medication. Avoid taking medications that contain aspirin, acetaminophen, ibuprofen, naproxen, or ketoprofen unless instructed by your care team. These medications may hide a fever. Do not treat diarrhea with over the counter products. Contact your care team if you have diarrhea that lasts more than 2 days or if it is severe and watery. This medication can make you more sensitive to the sun. Keep out of the sun. If you cannot avoid being in the sun, wear protective clothing and sunscreen. Do not use sun lamps, tanning beds, or tanning booths. Talk to your care team if you or your partner wish to become pregnant or think you might be pregnant. This medication can cause serious birth defects if  taken during pregnancy and for 3 months after the last dose. A reliable form of contraception is recommended while taking this medication and for 3 months after the last dose. Talk to your care team about effective forms of contraception. Do not father a child while taking this medication and for 3 months after the last dose. Use a condom while having sex during this time period. Do not breastfeed while taking this medication. This medication may cause infertility. Talk to your care team if you are concerned about your fertility. What side effects may I notice from receiving this medication? Side effects that you should report to your care team as soon as possible: Allergic reactions--skin rash, itching, hives, swelling of the face, lips, tongue, or throat Heart attack--pain or tightness in the chest, shoulders, arms, or jaw, nausea, shortness of breath,  cold or clammy skin, feeling faint or lightheaded Heart failure--shortness of breath, swelling of the ankles, feet, or hands, sudden weight gain, unusual weakness or fatigue Heart rhythm changes--fast or irregular heartbeat, dizziness, feeling faint or lightheaded, chest pain, trouble breathing High ammonia level--unusual weakness or fatigue, confusion, loss of appetite, nausea, vomiting, seizures Infection--fever, chills, cough, sore throat, wounds that don't heal, pain or trouble when passing urine, general feeling of discomfort or being unwell Low red blood cell level--unusual weakness or fatigue, dizziness, headache, trouble breathing Pain, tingling, or numbness in the hands or feet, muscle weakness, change in vision, confusion or trouble speaking, loss of balance or coordination, trouble walking, seizures Redness, swelling, and blistering of the skin over hands and feet Severe or prolonged diarrhea Unusual bruising or bleeding Side effects that usually do not require medical attention (report to your care team if they continue or are  bothersome): Dry skin Headache Increased tears Nausea Pain, redness, or swelling with sores inside the mouth or throat Sensitivity to light Vomiting This list may not describe all possible side effects. Call your doctor for medical advice about side effects. You may report side effects to FDA at 1-800-FDA-1088. Where should I keep my medication? This medication is given in a hospital or clinic. It will not be stored at home. NOTE: This sheet is a summary. It may not cover all possible information. If you have questions about this medicine, talk to your doctor, pharmacist, or health care provider.  2024 Elsevier/Gold Standard (2021-07-20 00:00:00)        To help prevent nausea and vomiting after your treatment, we encourage you to take your nausea medication as directed.  BELOW ARE SYMPTOMS THAT SHOULD BE REPORTED IMMEDIATELY: *FEVER GREATER THAN 100.4 F (38 C) OR HIGHER *CHILLS OR SWEATING *NAUSEA AND VOMITING THAT IS NOT CONTROLLED WITH YOUR NAUSEA MEDICATION *UNUSUAL SHORTNESS OF BREATH *UNUSUAL BRUISING OR BLEEDING *URINARY PROBLEMS (pain or burning when urinating, or frequent urination) *BOWEL PROBLEMS (unusual diarrhea, constipation, pain near the anus) TENDERNESS IN MOUTH AND THROAT WITH OR WITHOUT PRESENCE OF ULCERS (sore throat, sores in mouth, or a toothache) UNUSUAL RASH, SWELLING OR PAIN  UNUSUAL VAGINAL DISCHARGE OR ITCHING   Items with * indicate a potential emergency and should be followed up as soon as possible or go to the Emergency Department if any problems should occur.  Please show the CHEMOTHERAPY ALERT CARD or IMMUNOTHERAPY ALERT CARD at check-in to the Emergency Department and triage nurse.  Should you have questions after your visit or need to cancel or reschedule your appointment, please contact [facility] [phone]  and follow the prompts.  Office hours are 8:00 a.m. to 4:30 p.m. Monday - Friday. Please note that voicemails left  after 4:00 p.m. may not be returned until the following business day.  We are closed weekends and major holidays. You have access to a nurse at all times for urgent questions. Please call the main number to the clinic [phone] and follow the prompts.  For any non-urgent questions, you may also contact your provider using MyChart. We now offer e-Visits for anyone 70 and older to request care online for non-urgent symptoms. For details visit [email or web address].   Also download the MyChart app! Go to the app store, search "MyChart", open the app, select [place], and log in with your MyChart username and password.

## 2022-10-05 NOTE — Patient Instructions (Addendum)
[facility] at [facility] Discharge Instructions   You were seen and examined today by Dr. Ellin Saba.  He reviewed the results of your lab work which are mostly normal/stable. Your platelets are down to 61.   We will proceed with your treatment today.   Return as scheduled.    Thank you for choosing [facility] at [facility] to provide your oncology and hematology care.  To afford each patient quality time with our provider, please arrive at least 15 minutes before your scheduled appointment time.   If you have a lab appointment with the Cancer Center please come in thru the Main Entrance and check in at the main information desk.  You need to re-schedule your appointment should you arrive 10 or more minutes late.  We strive to give you quality time with our providers, and arriving late affects you and other patients whose appointments are after yours.  Also, if you no show three or more times for appointments you may be dismissed from the clinic at the providers discretion.     Again, thank you for choosing [facility].  Our hope is that these requests will decrease the amount of time that you wait before being seen by our physicians.       _____________________________________________________________  Should you have questions after your visit to [facility], please contact our office at [phone] and follow the prompts.  Our office hours are 8:00 a.m. and 4:30 p.m. Monday - Friday.  Please note that voicemails left after 4:00 p.m. may not be returned until the following business day.  We are closed weekends and major holidays.  You do have access to a nurse 24-7, just call the main number to the clinic [phone] and do not press any options, hold on the line and a nurse will answer the phone.    For prescription refill requests, have your pharmacy contact our office and allow 72 hours.    Due to Covid, you  will need to wear a mask upon entering the hospital. If you do not have a mask, a mask will be given to you at the Main Entrance upon arrival. For doctor visits, patients may have 1 support person age 69 or older with them. For treatment visits, patients can not have anyone with them due to social distancing guidelines and our immunocompromised population.

## 2022-10-05 NOTE — Progress Notes (Signed)
Holding oxaliplatin today.  Premedication only Dexamethasone 10 mg IVPB and Aloxi.  V.O. Dr Carilyn Goodpasture, PharmD

## 2022-10-05 NOTE — Progress Notes (Signed)
Patient has been examined by Dr. Ellin Saba. Vital signs and labs have been reviewed by MD - ANC, Creatinine, LFTs, hemoglobin, and platelets (61) are within treatment parameters per M.D. - pt may proceed with treatment. Hold oxaliplatin today per MD. Primary RN and pharmacy notified.

## 2022-10-06 LAB — CEA: CEA: 6.9 ng/mL — ABNORMAL HIGH (ref 0.0–4.7)

## 2022-10-06 LAB — TGAB+THYROGLOBULIN IMA OR LCMS: Thyroglobulin Antibody: <1 IU/mL (ref 0.0–0.9)

## 2022-10-06 LAB — THYROGLOBULIN BY IMA: Thyroglobulin by IMA: 6.9 ng/mL (ref 1.5–38.5)

## 2022-10-06 LAB — T3, FREE: T3, Free: 3.4 pg/mL (ref 2.0–4.4)

## 2022-10-07 ENCOUNTER — Inpatient Hospital Stay: Payer: 59

## 2022-10-07 VITALS — BP 109/72 | HR 82 | Temp 97.1°F | Resp 18

## 2022-10-07 DIAGNOSIS — C20 Malignant neoplasm of rectum: Secondary | ICD-10-CM

## 2022-10-07 DIAGNOSIS — Z5112 Encounter for antineoplastic immunotherapy: Secondary | ICD-10-CM | POA: Diagnosis not present

## 2022-10-07 MED ORDER — HEPARIN SOD (PORK) LOCK FLUSH 100 UNIT/ML IV SOLN
500.0000 [IU] | Freq: Once | INTRAVENOUS | Status: AC | PRN
  Administered 2022-10-07: 500 [IU]

## 2022-10-07 MED ORDER — SODIUM CHLORIDE 0.9% FLUSH
10.0000 mL | INTRAVENOUS | Status: DC | PRN
  Administered 2022-10-07: 10 mL

## 2022-10-07 NOTE — Patient Instructions (Signed)
[facility] [place]  Discharge Instructions: Thank you for choosing [facility] to provide your oncology and hematology care.  If you have a lab appointment with the Cancer Center - please note that after April 8th, 2024, all labs will be drawn in the cancer center.  You do not have to check in or register with the main entrance as you have in the past but will complete your check-in in the cancer center.  Wear comfortable clothing and clothing appropriate for easy access to any Portacath or PICC line.   We strive to give you quality time with your provider. You may need to reschedule your appointment if you arrive late (15 or more minutes).  Arriving late affects you and other patients whose appointments are after yours.  Also, if you miss three or more appointments without notifying the office, you may be dismissed from the clinic at the provider's discretion.      For prescription refill requests, have your pharmacy contact our office and allow 72 hours for refills to be completed.  To help prevent nausea and vomiting after your treatment, we encourage you to take your nausea medication as directed.  BELOW ARE SYMPTOMS THAT SHOULD BE REPORTED IMMEDIATELY: *FEVER GREATER THAN 100.4 F (38 C) OR HIGHER *CHILLS OR SWEATING *NAUSEA AND VOMITING THAT IS NOT CONTROLLED WITH YOUR NAUSEA MEDICATION *UNUSUAL SHORTNESS OF BREATH *UNUSUAL BRUISING OR BLEEDING *URINARY PROBLEMS (pain or burning when urinating, or frequent urination) *BOWEL PROBLEMS (unusual diarrhea, constipation, pain near the anus) TENDERNESS IN MOUTH AND THROAT WITH OR WITHOUT PRESENCE OF ULCERS (sore throat, sores in mouth, or a toothache) UNUSUAL RASH, SWELLING OR PAIN  UNUSUAL VAGINAL DISCHARGE OR ITCHING   Items with * indicate a potential emergency and should be followed up as soon as possible or go to the Emergency Department if any problems should occur.  Please show the CHEMOTHERAPY ALERT CARD or  IMMUNOTHERAPY ALERT CARD at check-in to the Emergency Department and triage nurse.  Should you have questions after your visit or need to cancel or reschedule your appointment, please contact [facility] [place] [phone]  and follow the prompts.  Office hours are 8:00 a.m. to 4:30 p.m. Monday - Friday. Please note that voicemails left after 4:00 p.m. may not be returned until the following business day.  We are closed weekends and major holidays. You have access to a nurse at all times for urgent questions. Please call the main number to the clinic [phone] and follow the prompts.  For any non-urgent questions, you may also contact your provider using MyChart. We now offer e-Visits for anyone 18 and older to request care online for non-urgent symptoms. For details visit [email or web address].   Also download the MyChart app! Go to the app store, search "MyChart", open the app, select [place], and log in with your MyChart username and password.   

## 2022-10-07 NOTE — Progress Notes (Signed)
Patient for chemotherapy pump disconnect with no complaints voiced. Patient refused Udenyca injection due to understanding in oncology visit she did not need the injection since stopping the Oxaliplatin.   Patients port flushed without difficulty.  Good blood return noted with no bruising or swelling noted at site.  Band aid applied.  VSS with discharge and left ambulatory with no s/s of distress noted.

## 2022-10-10 ENCOUNTER — Ambulatory Visit
Admission: RE | Admit: 2022-10-10 | Discharge: 2022-10-10 | Disposition: A | Discharge status: Home / Self Care | Payer: 59 | Source: Ambulatory Visit | Attending: Radiation Oncology | Admitting: Radiation Oncology

## 2022-10-10 ENCOUNTER — Inpatient Hospital Stay: Payer: 59

## 2022-10-10 VITALS — BP 104/73 | HR 92 | Temp 97.0°F | Resp 18

## 2022-10-10 DIAGNOSIS — Z5112 Encounter for antineoplastic immunotherapy: Secondary | ICD-10-CM | POA: Diagnosis not present

## 2022-10-10 DIAGNOSIS — C20 Malignant neoplasm of rectum: Secondary | ICD-10-CM

## 2022-10-10 MED ORDER — SODIUM CHLORIDE 0.9% FLUSH
10.0000 mL | INTRAVENOUS | Status: DC | PRN
  Administered 2022-10-10: 10 mL

## 2022-10-10 MED ORDER — MAGNESIUM SULFATE 2 GM/50ML IV SOLN
2.0000 g | Freq: Once | INTRAVENOUS | Status: AC
  Administered 2022-10-10: 2 g via INTRAVENOUS

## 2022-10-10 MED ORDER — HEPARIN SOD (PORK) LOCK FLUSH 100 UNIT/ML IV SOLN
500.0000 [IU] | Freq: Once | INTRAVENOUS | Status: AC | PRN
  Administered 2022-10-10: 500 [IU]

## 2022-10-10 MED ORDER — POTASSIUM CHLORIDE IN NACL 20-0.9 MEQ/L-% IV SOLN
Freq: Once | INTRAVENOUS | Status: AC
  Filled 2022-10-10: qty 1000

## 2022-10-10 NOTE — Patient Instructions (Signed)
[facility]  Discharge Instructions: Thank you for choosing [facility] to provide your oncology and hematology care.  If you have a lab appointment with the Cancer Center - please note that after April 8th, 2024, all labs will be drawn in the cancer center.  You do not have to check in or register with the main entrance as you have in the past but will complete your check-in in the cancer center.  Wear comfortable clothing and clothing appropriate for easy access to any Portacath or PICC line.   We strive to give you quality time with your provider. You may need to reschedule your appointment if you arrive late (15 or more minutes).  Arriving late affects you and other patients whose appointments are after yours.  Also, if you miss three or more appointments without notifying the office, you may be dismissed from the clinic at the provider's discretion.      For prescription refill requests, have your pharmacy contact our office and allow 72 hours for refills to be completed.    Today you received the following chemotherapy and/or immunotherapy agents IV Hydration.       To help prevent nausea and vomiting after your treatment, we encourage you to take your nausea medication as directed.  BELOW ARE SYMPTOMS THAT SHOULD BE REPORTED IMMEDIATELY: *FEVER GREATER THAN 100.4 F (38 C) OR HIGHER *CHILLS OR SWEATING *NAUSEA AND VOMITING THAT IS NOT CONTROLLED WITH YOUR NAUSEA MEDICATION *UNUSUAL SHORTNESS OF BREATH *UNUSUAL BRUISING OR BLEEDING *URINARY PROBLEMS (pain or burning when urinating, or frequent urination) *BOWEL PROBLEMS (unusual diarrhea, constipation, pain near the anus) TENDERNESS IN MOUTH AND THROAT WITH OR WITHOUT PRESENCE OF ULCERS (sore throat, sores in mouth, or a toothache) UNUSUAL RASH, SWELLING OR PAIN  UNUSUAL VAGINAL DISCHARGE OR ITCHING   Items with * indicate a potential emergency and should be followed up as soon as possible or go to  the Emergency Department if any problems should occur.  Please show the CHEMOTHERAPY ALERT CARD or IMMUNOTHERAPY ALERT CARD at check-in to the Emergency Department and triage nurse.  Should you have questions after your visit or need to cancel or reschedule your appointment, please contact [facility] [phone]  and follow the prompts.  Office hours are 8:00 a.m. to 4:30 p.m. Monday - Friday. Please note that voicemails left after 4:00 p.m. may not be returned until the following business day.  We are closed weekends and major holidays. You have access to a nurse at all times for urgent questions. Please call the main number to the clinic [phone] and follow the prompts.  For any non-urgent questions, you may also contact your provider using MyChart. We now offer e-Visits for anyone 54 and older to request care online for non-urgent symptoms. For details visit [email or web address].   Also download the MyChart app! Go to the app store, search "MyChart", open the app, select [place], and log in with your MyChart username and password.

## 2022-10-10 NOTE — Progress Notes (Signed)
Patient presents today for hydration.(House Fluids).  Vital signs stable. Patient has complaints of fatigue. Patient states she took Imodium over the weekend for diarrhea and it's resolved today.   Hydration given today per MD orders. Tolerated infusion without adverse affects. Vital signs stable. No complaints at this time. Discharged from clinic ambulatory in stable condition. Alert and oriented x 3. F/U with [facility] as scheduled.

## 2022-10-10 NOTE — Progress Notes (Addendum)
  Radiation Oncology         [phone] ________________________________  Name: Samantha Reynolds MRN: [id number]  Date of Service: 10/10/2022  DOB: 10/13/1965  Post Treatment Telephone Note  Diagnosis:  C79.51 Secondary malignant neoplasm of bone; C79.31 Secondary malignant neoplasm of brain (as documented in provider EOT note)   The patient was available for call today.  The patient did not note fatigue during radiation. The patient did not note hair loss or skin changes in the field of radiation during therapy. The patient is NOT taking dexamethasone. Pt. completed this medication approx 2 weeks post radiation. The patient does not have symptoms of  weakness or loss of control of the extremities. The patient does not have symptoms of headache. The patient does not have symptoms of seizure or uncontrolled movement. The patient does not have symptoms of changes in vision. The patient does not have changes in speech. The patient does not have confusion.   The patient was counseled that she will be contacted by our brain and spine navigator to schedule surveillance imaging. The patient was encouraged to call if she have not received a call to schedule imaging, or if she develop concerns or questions regarding radiation. The patient will also continue to follow up with Dr. Ellin Saba in medical oncology.   This concludes the interaction.  Ruel Favors, LPN

## 2022-10-19 ENCOUNTER — Inpatient Hospital Stay: Payer: 59

## 2022-10-19 ENCOUNTER — Inpatient Hospital Stay: Payer: 59 | Admitting: Hematology

## 2022-10-21 ENCOUNTER — Inpatient Hospital Stay: Payer: 59

## 2022-10-25 NOTE — Progress Notes (Signed)
[facility] [address], [place] [address]    Clinic Day:  10/26/22   Referring physician: Babs Sciara, MD  Patient Care Team: Babs Sciara, MD as PCP - General (Family Medicine) [name], MD as Medical Oncologist (Medical Oncology)   ASSESSMENT & PLAN:   Assessment: 1.  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  Liver biopsy on 03/09/2018 consistent with adenocarcinoma. 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 -7 cycles of FOLFOX with vectibix completed on 06/27/2018. -XRT with Xeloda from 07/30/2018 through 09/06/2018. -Right liver lesion microwave ablation on 10/15/2018 at [facility]. -Low anterior resection and diverting ileostomy on 12/07/2018, pathology [id number], 0/6 lymph nodes positive, margins negative. -Loop ileostomy reversal on 04/17/2019. - Microwave ablation of the right hepatic lobe lesion by Dr. Selena Batten around 08/15/2019. -SBRT to the left lower lobe lung lesion and left liver lobe lesion on 12/13/2019 at [facility]. - Right upper lobe lesion SBRT from 08/06/2020 through 08/13/2020. - 3 left lung lesions and subcarinal lymph node IMRT from 09/21/2021 through 10/05/2021. - Liver biopsy (06/09/2022): Metastatic moderately differentiated colonic adenocarcinoma. - NGS: KRAS/NRAS/BRAF wild-type, MSI-stable, HER2-0, negative for other targetable mutations - Cycle 1 of FOLFOX and bevacizumab on 07/06/2022 - XRT to the right neck from 07/14/2022 through 08/03/2022    Plan: 1.  Stage IV rectal adenocarcinoma, BRAF/RAS wild-type, MSI-stable: - Her last cycle was infusional 5-FU and leucovorin.  She tolerated well. - She came back from vacation and feels good. - Reviewed labs today: Normal LFTs with elevated total bilirubin.  Creatinine normal.  White count is low at 2.0 and platelet count 100 K.  ANC is 1.3. - She will proceed with cycle 7 today with 20% dose  reduction with FOLFOX and bevacizumab.  She will receive G-CSF on day 3. - She will have a PET scan done on 11/03/2022 and RTC in 2 weeks.   2.  Hyperbilirubinemia: - She has Gilbert's syndrome.  Bilirubin today is 2.3.   3.  Normocytic anemia: - Hemoglobin is 11.1.  Last ferritin 89 and percent saturation 36.   4.  Leukopenia and thrombocytopenia: - Intermittent leukopenia and thrombocytopenia exacerbated by splenomegaly.   5.  Brain metastasis: - Status post SRS for a 3 mm nodular enhancing lesion in the anterior right frontal lobe.   Addendum: - I was asked to evaluate this patient as she was having infusion reaction.  20 minutes after starting oxaliplatin, she developed itching in her ear and heart rate increased to 130s.  We stopped oxaliplatin.  We gave her Ativan 0.5 mg which improved her heart rate.  However she developed erythematous rash around her nose and chin.  I have given her Solu-Medrol 125 mg second dose.  After her erythema faded, we have started back on oxaliplatin.  I have reevaluated her and she tolerated oxaliplatin very well.  No orders of the defined types were placed in this encounter.     Alben Deeds Teague,acting as a Neurosurgeon for [name], MD.,have documented all relevant documentation on the behalf of [name], MD,as directed by  [name], MD while in the presence of [name], MD.  I, [name] MD, have reviewed the above documentation for accuracy and completeness, and I agree with the above.     [name], MD   7/31/20246:13 PM  CHIEF COMPLAINT:   Diagnosis: metastatic rectal cancer to liver    Cancer Staging  Malignant neoplasm  of rectum [facility]) Staging form: Colon and Rectum, AJCC 8th Edition - Clinical stage from 03/13/2018: Stage IVA (cT3, cN1b, cM1a) - Signed by [name], MD on 03/13/2018    Prior Therapy: 1. FOLFOX & vectibix x 7 cycles from 03/14/2018 to  06/27/2018. 2. XRT with Xeloda from 07/30/2018 to 09/06/2018. 3. Right liver lesion microwave ablation on 10/15/2018. 4. Low anterior resection and diverting ileostomy on 12/07/2018. 5. Right hepatic lobe microwave ablation on 08/15/19 6. SBRT to LLL and liver 11/15/2019 -12/13/2019. 7. SBRT to RUL 08/06/20 - 08/13/20 8. IMRT to 3 left lung lesions and subcarinal LN 09/21/21 - 10/05/21 9. XRT to C6 07/14/22 - 08/03/22  Current Therapy:  FOLFOX and bevacizumab    HISTORY OF PRESENT ILLNESS:   Oncology History  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HCC)  02/26/2018 Initial Diagnosis   Rectal cancer (HCC)   03/13/2018 Cancer Staging   Staging form: Colon and Rectum, AJCC 8th Edition - Clinical stage from 03/13/2018: Stage IVA (cT3, cN1b, cM1a) - Signed by [name], MD on 03/13/2018   03/14/2018 - 06/29/2018 Chemotherapy   The patient had palonosetron (ALOXI) injection 0.25 mg, 0.25 mg, Intravenous,  Once, 7 of 8 cycles Administration: 0.25 mg (03/14/2018), 0.25 mg (03/27/2018), 0.25 mg (04/18/2018), 0.25 mg (05/02/2018), 0.25 mg (05/16/2018), 0.25 mg (06/05/2018), 0.25 mg (06/27/2018) leucovorin 800 mg in dextrose 5 % 250 mL infusion, 772 mg, Intravenous,  Once, 7 of 8 cycles Administration: 800 mg (03/14/2018), 800 mg (03/27/2018), 800 mg (05/02/2018), 800 mg (05/16/2018), 700 mg (04/18/2018), 800 mg (06/05/2018), 800 mg (06/27/2018) oxaliplatin (ELOXATIN) 165 mg in dextrose 5 % 500 mL chemo infusion, 85 mg/m2 = 165 mg, Intravenous,  Once, 6 of 7 cycles Dose modification: 68 mg/m2 (80 % of original dose 85 mg/m2, Cycle 4, Reason: Provider Judgment) Administration: 165 mg (03/14/2018), 165 mg (03/27/2018), 130 mg (05/02/2018), 130 mg (05/16/2018), 130 mg (06/05/2018), 130 mg (06/27/2018) panitumumab (VECTIBIX) 500 mg in sodium chloride 0.9 % 100 mL chemo infusion, 480 mg, Intravenous,  Once, 4 of 5 cycles Administration: 500 mg (04/18/2018), 480 mg (05/02/2018), 480 mg (05/16/2018), 480 mg (06/05/2018) fluorouracil  (ADRUCIL) chemo injection 750 mg, 400 mg/m2 = 750 mg (100 % of original dose 400 mg/m2), Intravenous,  Once, 6 of 7 cycles Dose modification: 400 mg/m2 (original dose 400 mg/m2, Cycle 1), 400 mg/m2 (original dose 400 mg/m2, Cycle 3) Administration: 750 mg (03/14/2018), 750 mg (04/18/2018), 750 mg (05/02/2018), 750 mg (05/16/2018), 750 mg (06/05/2018), 750 mg (06/27/2018) fosaprepitant (EMEND) 150 mg, dexamethasone (DECADRON) 12 mg in sodium chloride 0.9 % 145 mL IVPB, , Intravenous,  Once, 4 of 5 cycles Administration:  (05/02/2018),  (05/16/2018),  (06/05/2018),  (06/27/2018) fluorouracil (ADRUCIL) 4,650 mg in sodium chloride 0.9 % 57 mL chemo infusion, 2,400 mg/m2 = 4,650 mg, Intravenous, 1 Day/Dose, 7 of 8 cycles Administration: 4,650 mg (03/14/2018), 4,650 mg (03/27/2018), 4,650 mg (04/18/2018), 4,650 mg (05/02/2018), 4,650 mg (05/16/2018), 4,650 mg (06/05/2018), 4,650 mg (06/27/2018)  for chemotherapy treatment.    07/06/2022 -  Chemotherapy   Patient is on Treatment Plan : COLORECTAL FOLFOX + Bevacizumab q14d        INTERVAL HISTORY:   Samantha Reynolds is a 57 y.o. female presenting to clinic today for follow up of metastatic rectal cancer to liver. She was last seen by me on 10/05/22.  Today, she states that she is doing well overall. Her appetite level is at 85%. Her energy level is at 70%. She is accompanied by her husband.  She denies any cold  sensitivities or aching in the hands or wrists. She denies any dysuria. She does note dryness of the skin around the genital area that could cause the trace amounts of the hgb and moderate leukocytes in the urine on her recent urinalysis today.   PAST MEDICAL HISTORY:   Past Medical History: Past Medical History:  Diagnosis Date   Anxiety    Cancer (HCC) 2013   thyroid   Endometrial polyp    Endometrial thickening on ultra sound    GERD (gastroesophageal reflux disease)    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   Worse in her 64s when she was ill   H/O Hashimoto thyroiditis     History of chemotherapy    History of radiation therapy    History of thyroid cancer no recurrence   2013--  s/p  left lobe thyroidectomy--  follicular varient papillary / lymphocyctic thyroiditis   Hyperlipidemia 11/09/2015   Hypothyroidism    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HCC) 02/26/2018    Surgical History: Past Surgical History:  Procedure Laterality Date   BIOPSY  02/19/2018   Procedure: BIOPSY;  Surgeon: Malissa Hippo, MD;  [facility];  Service: Endoscopy;;  rectum   COLONOSCOPY N/A 08/20/2015   Procedure: COLONOSCOPY;  Surgeon: Malissa Hippo, MD;  [facility];  Service: Endoscopy;  Laterality: N/A;  730   COLONOSCOPY WITH PROPOFOL N/A 07/21/2021   Procedure: COLONOSCOPY WITH PROPOFOL;  Surgeon: Malissa Hippo, MD;  [facility];  Service: Endoscopy;  Laterality: N/A;  10:10   D & C HYSTEROOSCOPY W/ THERMACHOICE ENDOMETRIAL ABLATION  08-13-2004   DIVERTING ILEOSTOMY N/A 12/07/2018   Procedure: DIVERTING LOOP ILEOSTOMY;  Surgeon: Romie Levee, MD;  Location: [facility];  Service: General;  Laterality: N/A;   FLEXIBLE SIGMOIDOSCOPY N/A 02/19/2018   Procedure: FLEXIBLE SIGMOIDOSCOPY;  Surgeon: Malissa Hippo, MD;  [facility];  Service: Endoscopy;  Laterality: N/A;   HYSTEROSCOPY WITH D & C N/A 04/30/2015   Procedure: DILATATION AND CURETTAGE / INTENDED HYSTEROSCOPY;  Surgeon: Marcelle Overlie, MD;  Location: [facility];  Service: Gynecology;  Laterality: N/A;   ILEOSTOMY CLOSURE N/A 04/17/2019   Procedure: LOOP ILEOSTOMY REVERSAL;  Surgeon: Romie Levee, MD;  Location: [facility];  Service: General;  Laterality: N/A;   IR US GUIDE BX ASP/DRAIN  03/09/2018   LAPAROSCOPIC CHOLECYSTECTOMY  04/1996   LAPAROSCOPY N/A 12/11/2018   Procedure: LAPAROSCOPY  ILEOSTOMY REVISION AND ABDOMINAL WASHOUT;  Surgeon: Romie Levee, MD;  Location: [facility];  Service: General;  Laterality: N/A;   Liver Microwave   10/17/2018   POLYPECTOMY   08/20/2015   Procedure: POLYPECTOMY;  Surgeon: Malissa Hippo, MD;  [facility];  Service: Endoscopy;;  Splenic flexure polypectomy   POLYPECTOMY  02/19/2018   Procedure: POLYPECTOMY;  Surgeon: Malissa Hippo, MD;  [facility];  Service: Endoscopy;;  rectum   PORTACATH PLACEMENT N/A 03/14/2018   Procedure: INSERTION PORT-A-CATH;  Surgeon: Franky Macho, MD;  Location: [facility];  Service: General;  Laterality: N/A;   REDUCTION INCARCERATED UTERUS  06-20-2000   intrauterine preg. 13 wks /  urinary retention   THYROID LOBECTOMY  11/24/2011   Procedure: THYROID LOBECTOMY;  Surgeon: Velora Heckler, MD;  Location: [facility];  Service: General;  Laterality: Left;  Left Thyroid Lobectomy   TUBAL LIGATION  2002   XI ROBOTIC ASSISTED LOWER ANTERIOR RESECTION N/A 12/07/2018   Procedure: XI ROBOTIC ASSISTED LOWER ANTERIOR RESECTION, SPENIC FLEXURE IMMOBILIZATION, COLOANAL  ANASTOMOSIS, RIGID PROCTOSCOPY;  Surgeon: Romie Levee, MD;  Location: [facility];  Service: General;  Laterality: N/A;    Social History: Social History   Socioeconomic History   Marital status: Married    Spouse name: Not on file   Number of children: 4   Years of education: Not on file   Highest education level: Not on file  Occupational History    Comment: Systems developer at [employer]  Tobacco Use   Smoking status: Former    Current packs/day: 0.00    Average packs/day: 0.3 packs/day for 10.0 years (2.5 ttl pk-yrs)    Types: Cigarettes    Start date: 10/30/1981    Quit date: 10/31/1991    Years since quitting: 31.0   Smokeless tobacco: Never  Vaping Use   Vaping status: Never Used  Substance and Sexual Activity   Alcohol use: No   Drug use: No   Sexual activity: Not Currently  Other Topics Concern   Not on file  Social History Narrative   Lives with husband Loraine Leriche and 78 year old son Almeta Monas   Husband is Technical brewer of [employer]   Social Determinants of Health   Financial Resource Strain: Low Risk   (02/26/2018)   Overall Financial Resource Strain (CARDIA)    Difficulty of Paying Living Expenses: Not hard at all  Food Insecurity: No Food Insecurity (02/26/2018)   Hunger Vital Sign    Worried About Running Out of Food in the Last Year: Never true    Ran Out of Food in the Last Year: Never true  Transportation Needs: No Transportation Needs (02/26/2018)   PRAPARE - Administrator, Civil Service (Medical): No    Lack of Transportation (Non-Medical): No  Physical Activity: Inactive (02/26/2018)   Exercise Vital Sign    Days of Exercise per Week: 0 days    Minutes of Exercise per Session: 0 min  Stress: Stress Concern Present (02/26/2018)   [employer] of Occupational Health - Occupational Stress Questionnaire    Feeling of Stress : To some extent  Social Connections: Socially Integrated (02/26/2018)   Social Connection and Isolation Panel [NHANES]    Frequency of Communication with Friends and Family: More than three times a week    Frequency of Social Gatherings with Friends and Family: Twice a week    Attends Religious Services: More than 4 times per year    Active Member of [employer] or Organizations: Yes    Attends Engineer, structural: More than 4 times per year    Marital Status: Married  Catering manager Violence: Not At Risk (02/26/2018)   Humiliation, Afraid, Rape, and Kick questionnaire    Fear of Current or Ex-Partner: No    Emotionally Abused: No    Physically Abused: No    Sexually Abused: No    Family History: Family History  Problem Relation Age of Onset   Hypertension Mother    Hypertension Father    Prostate cancer Father    Cancer Maternal Aunt        spine/back   Cancer Paternal Grandfather        lung   Healthy Son    Healthy Son    Healthy Son    Healthy Son    Colon cancer Neg Hx     Current Medications:  Current Outpatient Medications:    ALPRAZolam (XANAX) 0.5 MG tablet, Take 1 tablet (0.5 mg total) by mouth 3 (three) times  daily as needed for  anxiety., Disp: 90 tablet, Rfl: 5   amitriptyline (ELAVIL) 10 MG tablet, Take 1 tablet (10 mg total) by mouth 2 (two) times daily., Disp: 180 tablet, Rfl: 3   ARMOUR THYROID 60 MG tablet, Take 1 tablet (60 mg total) by mouth daily., Disp: 90 tablet, Rfl: 1   ARMOUR THYROID 60 MG tablet, Take 1 tablet (60 mg total) by mouth daily., Disp: 90 tablet, Rfl: 1   loperamide (IMODIUM) 2 MG capsule, Take 1 capsule (2 mg total) by mouth 3 (three) times daily. (Patient taking differently: Take 2 mg by mouth daily.), Disp: 30 capsule, Rfl: 0   pregabalin (LYRICA) 25 MG capsule, Take 1 capsule (25 mg total) by mouth 2 (two) times daily., Disp: 60 capsule, Rfl: 3   prochlorperazine (COMPAZINE) 10 MG tablet, Take 1 tablet (10 mg total) by mouth every 6 (six) hours as needed for nausea or vomiting., Disp: 30 tablet, Rfl: 4   sucralfate (CARAFATE) 1 g tablet, Take 1 tablet (1 g total) by mouth 4 (four) times daily -  with meals and at bedtime. Crush 1 tablet in 1 oz water and drink 5 min before meals for radiation induced esophagitis, Disp: 120 tablet, Rfl: 2   tiZANidine (ZANAFLEX) 4 MG tablet, Take 1 tablet (4 mg total) by mouth every 6 (six) hours as needed for muscle spasms (take for tightness and pain in the scapular region)., Disp: 56 tablet, Rfl: 2 No current facility-administered medications for this visit.  Facility-Administered Medications Ordered in Other Visits:    clindamycin (CLEOCIN) 900 mg in dextrose 5 % 50 mL IVPB, 900 mg, Intravenous, 60 min Pre-Op **AND** gentamicin (GARAMYCIN) 350 mg in dextrose 5 % 50 mL IVPB, 5 mg/kg, Intravenous, 60 min Pre-Op, Romie Levee, MD   clindamycin (CLEOCIN) 900 mg in dextrose 5 % 50 mL IVPB, 900 mg, Intravenous, 60 min Pre-Op **AND** gentamicin (GARAMYCIN) 5 mg/kg in dextrose 5 % 50 mL IVPB, 5 mg/kg, Intravenous, 60 min Pre-Op, Romie Levee, MD   fluorouracil (ADRUCIL) 3,500 mg in sodium chloride 0.9 % 80 mL chemo infusion, 1,920 mg/m2  (Treatment Plan Recorded), Intravenous, 1 day or 1 dose, [name], MD, Infusion Verify at 10/26/22 1808   sodium chloride 0.9 % 1,000 mL with potassium chloride 20 mEq, magnesium sulfate 2 g infusion, , Intravenous, Once, [name], MD   Allergies: Allergies  Allergen Reactions   Oxaliplatin Other (See Comments)    Patient had hypersensitivity reaction to Oxaliplatin. Patient c/o itching in her ears and eyes. Pt also c/o being hot, flushed, dizzy with redness on pt's face, arms, and legs. Pt c/o tingling in her hands and legs. Pt also had a dry throat with coughing. See progress note from 09/07/22 at 1130. Patient was not able to complete Oxaliplatin infusion.    Demerol [Meperidine] Itching    All over the body.   Penicillins Hives     childhood, does not remember if it spread all over the body or not. Did it involve swelling of the face/tongue/throat, SOB, or low BP? No Did it involve sudden or severe rash/hives, skin peeling, or any reaction on the inside of your mouth or nose? Yes Did you need to seek medical attention at a hospital or doctor's office? Yes When did it last happen?      childhood allergy If all above answers are "NO", may proceed with cephalosporin use.    Levaquin [Levofloxacin] Other (See Comments)    Patient has Aortic Aneurysm and is not indicated with this  diagnosis   Other Hives and Other (See Comments)    Fresh coconut    Vancomycin Rash    Will need Benadryl prior to administration per IV. "Red man syndrome"    REVIEW OF SYSTEMS:   Review of Systems  Constitutional:  Negative for chills, fatigue and fever.  HENT:   Negative for lump/mass, mouth sores, nosebleeds, sore throat and trouble swallowing.   Eyes:  Negative for eye problems.  Respiratory:  Negative for cough and shortness of breath.   Cardiovascular:  Negative for chest pain, leg swelling and palpitations.  Gastrointestinal:  Positive for constipation (occasional) and  diarrhea (occasional). Negative for abdominal pain, nausea and vomiting.  Genitourinary:  Negative for bladder incontinence, difficulty urinating, dysuria, frequency, hematuria and nocturia.   Musculoskeletal:  Negative for arthralgias, back pain, flank pain, myalgias and neck pain.  Skin:  Negative for itching and rash.  Neurological:  Negative for dizziness, headaches and numbness.  Hematological:  Does not bruise/bleed easily.  Psychiatric/Behavioral:  Positive for depression. Negative for sleep disturbance and suicidal ideas. The patient is nervous/anxious.   All other systems reviewed and are negative.    VITALS:   Last menstrual period 02/16/2015.  Wt Readings from Last 3 Encounters:  10/26/22 138 lb (62.6 kg)  10/05/22 136 lb (61.7 kg)  09/21/22 137 lb 9.6 oz (62.4 kg)    There is no height or weight on file to calculate BMI.  Performance status (ECOG): 1 - Symptomatic but completely ambulatory  PHYSICAL EXAM:   Physical Exam Vitals and nursing note reviewed. Exam conducted with a chaperone present.  Constitutional:      Appearance: Normal appearance.  Cardiovascular:     Rate and Rhythm: Normal rate and regular rhythm.     Pulses: Normal pulses.     Heart sounds: Normal heart sounds.  Pulmonary:     Effort: Pulmonary effort is normal.     Breath sounds: Normal breath sounds.  Abdominal:     Palpations: Abdomen is soft. There is no hepatomegaly, splenomegaly or mass.     Tenderness: There is no abdominal tenderness.  Musculoskeletal:     Right lower leg: No edema.     Left lower leg: No edema.  Lymphadenopathy:     Cervical: No cervical adenopathy.     Right cervical: No superficial, deep or posterior cervical adenopathy.    Left cervical: No superficial, deep or posterior cervical adenopathy.     Upper Body:     Right upper body: No supraclavicular or axillary adenopathy.     Left upper body: No supraclavicular or axillary adenopathy.  Neurological:      General: No focal deficit present.     Mental Status: She is alert and oriented to person, place, and time.  Psychiatric:        Mood and Affect: Mood normal.        Behavior: Behavior normal.     LABS:      Latest Ref Rng & Units 10/26/2022    9:19 AM 10/05/2022    8:05 AM 09/21/2022    7:54 AM  CBC  WBC 4.0 - 10.5 K/uL 2.0  3.0  2.5   Hemoglobin 12.0 - 15.0 g/dL 29.5  62.1  30.8   Hematocrit 36.0 - 46.0 % 33.4  31.7  32.5   Platelets 150 - 400 K/uL 100  61  106       Latest Ref Rng & Units 10/26/2022    9:19 AM 10/05/2022  8:05 AM 09/21/2022    7:54 AM  CMP  Glucose 70 - 99 mg/dL 93  87  99   BUN 6 - 20 mg/dL 12  12  12    Creatinine 0.44 - 1.00 mg/dL 1.61  0.96  0.45   Sodium 135 - 145 mmol/L 135  135  135   Potassium 3.5 - 5.1 mmol/L 3.3  3.5  3.4   Chloride 98 - 111 mmol/L 104  104  104   CO2 22 - 32 mmol/L 22  23  22    Calcium 8.9 - 10.3 mg/dL 8.4  8.6  8.7   Total Protein 6.5 - 8.1 g/dL 7.1  6.7  7.0   Total Bilirubin 0.3 - 1.2 mg/dL 2.3  2.8  4.1   Alkaline Phos 38 - 126 U/L 64  65  58   AST 15 - 41 U/L 18  20  21    ALT 0 - 44 U/L 10  13  10       Lab Results  Component Value Date   CEA1 6.9 (H) 10/05/2022   /  CEA  Date Value Ref Range Status  10/05/2022 6.9 (H) 0.0 - 4.7 ng/mL Final    Comment:    (NOTE)                             Nonsmokers          <3.9                             Smokers             <5.6 Roche [employer]ostics Electrochemiluminescence Immunoassay (ECLIA) Values obtained with different assay methods or kits cannot be used interchangeably.  Results cannot be interpreted as absolute evidence of the presence or absence of malignant disease. Performed At: [facility] [address] [place], [place] [id number] Jolene Schimke MD NW:[id number]    No results found for: "PSA1" No results found for: "CAN199" No results found for: "CAN125"  No results found for: "TOTALPROTELP", "ALBUMINELP", "A1GS", "A2GS", "BETS", "BETA2SER",  "GAMS", "MSPIKE", "SPEI" Lab Results  Component Value Date   TIBC 333 09/07/2022   TIBC 364 05/11/2022   TIBC 385 11/09/2020   FERRITIN 89 09/07/2022   FERRITIN 61 05/11/2022   FERRITIN 54 11/09/2020   IRONPCTSAT 36 (H) 09/07/2022   IRONPCTSAT 26 05/11/2022   IRONPCTSAT 22 11/09/2020   Lab Results  Component Value Date   LDH 154 01/20/2020     STUDIES:   No results found.

## 2022-10-26 ENCOUNTER — Telehealth: Payer: Self-pay

## 2022-10-26 ENCOUNTER — Encounter: Payer: Self-pay | Admitting: Family Medicine

## 2022-10-26 ENCOUNTER — Inpatient Hospital Stay: Payer: 59

## 2022-10-26 ENCOUNTER — Inpatient Hospital Stay: Payer: 59 | Admitting: Hematology

## 2022-10-26 ENCOUNTER — Other Ambulatory Visit: Payer: Self-pay | Admitting: Family Medicine

## 2022-10-26 VITALS — BP 119/76 | HR 109 | Temp 98.2°F | Resp 18

## 2022-10-26 DIAGNOSIS — C20 Malignant neoplasm of rectum: Secondary | ICD-10-CM

## 2022-10-26 DIAGNOSIS — C787 Secondary malignant neoplasm of liver and intrahepatic bile duct: Secondary | ICD-10-CM

## 2022-10-26 DIAGNOSIS — Z5112 Encounter for antineoplastic immunotherapy: Secondary | ICD-10-CM | POA: Diagnosis not present

## 2022-10-26 DIAGNOSIS — E876 Hypokalemia: Secondary | ICD-10-CM

## 2022-10-26 LAB — CBC WITH DIFFERENTIAL/PLATELET
Abs Immature Granulocytes: 0 10*3/uL (ref 0.00–0.07)
Basophils Absolute: 0 10*3/uL (ref 0.0–0.1)
Basophils Relative: 0 %
Eosinophils Absolute: 0 10*3/uL (ref 0.0–0.5)
Eosinophils Relative: 2 %
HCT: 33.4 % — ABNORMAL LOW (ref 36.0–46.0)
Hemoglobin: 11.1 g/dL — ABNORMAL LOW (ref 12.0–15.0)
Immature Granulocytes: 0 %
Lymphocytes Relative: 22 %
Lymphs Abs: 0.4 10*3/uL — ABNORMAL LOW (ref 0.7–4.0)
MCH: 35 pg — ABNORMAL HIGH (ref 26.0–34.0)
MCHC: 33.2 g/dL (ref 30.0–36.0)
MCV: 105.4 fL — ABNORMAL HIGH (ref 80.0–100.0)
Monocytes Absolute: 0.2 10*3/uL (ref 0.1–1.0)
Monocytes Relative: 11 %
Neutro Abs: 1.3 10*3/uL — ABNORMAL LOW (ref 1.7–7.7)
Neutrophils Relative %: 65 %
Platelets: 100 10*3/uL — ABNORMAL LOW (ref 150–400)
RBC: 3.17 MIL/uL — ABNORMAL LOW (ref 3.87–5.11)
RDW: 13.6 % (ref 11.5–15.5)
WBC: 2 10*3/uL — ABNORMAL LOW (ref 4.0–10.5)
nRBC: 0 % (ref 0.0–0.2)

## 2022-10-26 LAB — COMPREHENSIVE METABOLIC PANEL
ALT: 10 U/L (ref 0–44)
AST: 18 U/L (ref 15–41)
Albumin: 3.9 g/dL (ref 3.5–5.0)
Alkaline Phosphatase: 64 U/L (ref 38–126)
Anion gap: 9 (ref 5–15)
BUN: 12 mg/dL (ref 6–20)
CO2: 22 mmol/L (ref 22–32)
Calcium: 8.4 mg/dL — ABNORMAL LOW (ref 8.9–10.3)
Chloride: 104 mmol/L (ref 98–111)
Creatinine, Ser: 0.8 mg/dL (ref 0.44–1.00)
GFR, Estimated: >60 mL/min (ref >60)
Glucose, Bld: 93 mg/dL (ref 70–99)
Potassium: 3.3 mmol/L — ABNORMAL LOW (ref 3.5–5.1)
Sodium: 135 mmol/L (ref 135–145)
Total Bilirubin: 2.3 mg/dL — ABNORMAL HIGH (ref 0.3–1.2)
Total Protein: 7.1 g/dL (ref 6.5–8.1)

## 2022-10-26 LAB — URINALYSIS, DIPSTICK ONLY
Bilirubin Urine: NEGATIVE
Glucose, UA: NEGATIVE mg/dL
Ketones, ur: NEGATIVE mg/dL
Nitrite: NEGATIVE
Protein, ur: NEGATIVE mg/dL
Specific Gravity, Urine: 1.011 (ref 1.005–1.030)
pH: 5 (ref 5.0–8.0)

## 2022-10-26 LAB — MAGNESIUM: Magnesium: 2.1 mg/dL (ref 1.7–2.4)

## 2022-10-26 MED ORDER — SODIUM CHLORIDE 0.9% FLUSH
10.0000 mL | Freq: Once | INTRAVENOUS | Status: AC
  Administered 2022-10-26: 10 mL via INTRAVENOUS

## 2022-10-26 MED ORDER — SODIUM CHLORIDE 0.9 % IV SOLN: Freq: Once | INTRAVENOUS | Status: AC

## 2022-10-26 MED ORDER — METHYLPREDNISOLONE SODIUM SUCC 125 MG IJ SOLR
125.0000 mg | Freq: Once | INTRAMUSCULAR | Status: AC
  Administered 2022-10-26: 125 mg via INTRAVENOUS
  Filled 2022-10-26: qty 2

## 2022-10-26 MED ORDER — LEUCOVORIN CALCIUM INJECTION 350 MG
320.0000 mg/m2 | Freq: Once | INTRAVENOUS | Status: AC
  Administered 2022-10-26: 540 mg via INTRAVENOUS
  Filled 2022-10-26: qty 27

## 2022-10-26 MED ORDER — MONTELUKAST SODIUM 10 MG PO TABS
10.0000 mg | ORAL_TABLET | Freq: Once | ORAL | Status: AC
  Administered 2022-10-26: 10 mg via ORAL
  Filled 2022-10-26: qty 1

## 2022-10-26 MED ORDER — DIPHENHYDRAMINE HCL 50 MG/ML IJ SOLN
50.0000 mg | Freq: Once | INTRAMUSCULAR | Status: AC
  Administered 2022-10-26: 50 mg via INTRAVENOUS
  Filled 2022-10-26: qty 1

## 2022-10-26 MED ORDER — OXALIPLATIN CHEMO INJECTION 100 MG/20ML
68.0000 mg/m2 | Freq: Once | INTRAVENOUS | Status: AC
  Administered 2022-10-26: 115 mg via INTRAVENOUS
  Filled 2022-10-26: qty 20

## 2022-10-26 MED ORDER — AMITRIPTYLINE HCL 10 MG PO TABS
10.0000 mg | ORAL_TABLET | Freq: Two times a day (BID) | ORAL | 3 refills | Status: DC
  Filled 2022-10-26: qty 180, 90d supply, fill #0
  Filled 2023-06-18: qty 180, 90d supply, fill #1
  Filled 2023-10-23: qty 180, 90d supply, fill #2

## 2022-10-26 MED ORDER — POTASSIUM CHLORIDE CRYS ER 20 MEQ PO TBCR
40.0000 meq | EXTENDED_RELEASE_TABLET | Freq: Once | ORAL | Status: AC
  Administered 2022-10-26: 40 meq via ORAL
  Filled 2022-10-26: qty 2

## 2022-10-26 MED ORDER — ALPRAZOLAM 0.5 MG PO TABS
0.5000 mg | ORAL_TABLET | Freq: Three times a day (TID) | ORAL | 2 refills | Status: DC | PRN
  Filled 2022-10-26: qty 90, 30d supply, fill #0
  Filled 2022-12-28: qty 90, 30d supply, fill #1
  Filled 2023-02-08: qty 90, 30d supply, fill #2

## 2022-10-26 MED ORDER — LORAZEPAM 2 MG/ML IJ SOLN
0.5000 mg | Freq: Once | INTRAMUSCULAR | Status: AC
  Administered 2022-10-26: 0.5 mg via INTRAVENOUS
  Filled 2022-10-26: qty 1

## 2022-10-26 MED ORDER — SODIUM CHLORIDE 0.9 % IV SOLN
1920.0000 mg/m2 | INTRAVENOUS | Status: DC
  Administered 2022-10-26: 3500 mg via INTRAVENOUS
  Filled 2022-10-26: qty 70

## 2022-10-26 MED ORDER — DEXTROSE 5 % IV SOLN: Freq: Once | INTRAVENOUS | Status: AC

## 2022-10-26 MED ORDER — FLUOROURACIL CHEMO INJECTION 500 MG/10ML
320.0000 mg/m2 | Freq: Once | INTRAVENOUS | Status: AC
  Administered 2022-10-26: 500 mg via INTRAVENOUS
  Filled 2022-10-26: qty 10

## 2022-10-26 MED ORDER — PALONOSETRON HCL INJECTION 0.25 MG/5ML
0.2500 mg | Freq: Once | INTRAVENOUS | Status: AC
  Administered 2022-10-26: 0.25 mg via INTRAVENOUS
  Filled 2022-10-26: qty 5

## 2022-10-26 MED ORDER — SODIUM CHLORIDE 0.9 % IV SOLN
20.0000 mg | Freq: Once | INTRAVENOUS | Status: AC
  Administered 2022-10-26: 20 mg via INTRAVENOUS
  Filled 2022-10-26: qty 20

## 2022-10-26 MED ORDER — METHYLPREDNISOLONE SODIUM SUCC 125 MG IJ SOLR
125.0000 mg | Freq: Once | INTRAMUSCULAR | Status: AC | PRN
  Administered 2022-10-26: 125 mg via INTRAVENOUS

## 2022-10-26 MED ORDER — SODIUM CHLORIDE 0.9 % IV SOLN
40.0000 mg | Freq: Once | INTRAVENOUS | Status: AC
  Administered 2022-10-26: 40 mg via INTRAVENOUS
  Filled 2022-10-26: qty 4

## 2022-10-26 MED ORDER — SODIUM CHLORIDE 0.9 % IV SOLN
5.0000 mg/kg | Freq: Once | INTRAVENOUS | Status: AC
  Administered 2022-10-26: 300 mg via INTRAVENOUS
  Filled 2022-10-26: qty 12

## 2022-10-26 NOTE — Telephone Encounter (Signed)
Called pt. to inquire about current med list accuracy. Left a voicemail to return my call.    Ruel Favors, LPN

## 2022-10-26 NOTE — Progress Notes (Signed)
Hypersensitivity Reaction note  Date of event: 10/26/22 Time of event: 1244  Generic name of drug involved: Oxaliplatin Name of provider notified of the hypersensitivity reaction: [name], MD Was agent that likely caused hypersensitivity reaction added to Allergies List within EMR? yes Chain of events including reaction signs/symptoms, treatment administered, and outcome (e.g., drug resumed; drug discontinued; sent to Emergency Department; etc.)   1244:  Patient's husband notified nurse of a possible reaction.  Infusion was immediately stopped and NS was started.  Patient c/o itching in her ears and heart racing.  Vitals were 133/83, HR 116 and O2 sats of 100%.    1247- MD at bedside.  MD ordered an additional Ativan 0.5 mg IV x one dose and to restart infusion once HR returned to normal.   1255:  Ativan 0.5 mg IV given.   1300:  RN noticed patient's face was very flushed.  Patient denied any new symptoms.     1315:  MD at bedside again.  Dr. Ellin Saba ordered an additional Solu-Medrol 125 mg IV x one dose.  MD stated that once flushing improved, treatment could be restarted.     1350:  Flushing improved.  Ok to restart Oxaliplatin/Leucovorin per Dr. Ellin Saba.     Peggye Pitt, RN 10/26/2022 4:12 PM

## 2022-10-26 NOTE — Progress Notes (Signed)
Patients port flushed without difficulty.  Good blood return noted with no bruising or swelling noted at site.  Stable during access and blood draw.  Patient to remain accessed for treatment. 

## 2022-10-26 NOTE — Patient Instructions (Signed)
[facility] at [facility] Discharge Instructions   You were seen and examined today by Dr. Ellin Saba.  He reviewed the results of your lab work which are normal/stable. Your white blood cell count is low at 2.0. You will receive the white blood cell booster shot when the pump comes off.   We will proceed with your treatment today.   Return as scheduled.    Thank you for choosing [facility] at [facility] to provide your oncology and hematology care.  To afford each patient quality time with our provider, please arrive at least 15 minutes before your scheduled appointment time.   If you have a lab appointment with the Cancer Center please come in thru the Main Entrance and check in at the main information desk.  You need to re-schedule your appointment should you arrive 10 or more minutes late.  We strive to give you quality time with our providers, and arriving late affects you and other patients whose appointments are after yours.  Also, if you no show three or more times for appointments you may be dismissed from the clinic at the providers discretion.     Again, thank you for choosing [facility].  Our hope is that these requests will decrease the amount of time that you wait before being seen by our physicians.       _____________________________________________________________  Should you have questions after your visit to [facility], please contact our office at [phone] and follow the prompts.  Our office hours are 8:00 a.m. and 4:30 p.m. Monday - Friday.  Please note that voicemails left after 4:00 p.m. may not be returned until the following business day.  We are closed weekends and major holidays.  You do have access to a nurse 24-7, just call the main number to the clinic [phone] and do not press any options, hold on the line and a nurse will answer the phone.    For prescription refill requests,  have your pharmacy contact our office and allow 72 hours.    Due to Covid, you will need to wear a mask upon entering the hospital. If you do not have a mask, a mask will be given to you at the Main Entrance upon arrival. For doctor visits, patients may have 1 support person age 63 or older with them. For treatment visits, patients can not have anyone with them due to social distancing guidelines and our immunocompromised population.

## 2022-10-26 NOTE — Progress Notes (Signed)
Patient has been examined by Dr. Ellin Saba. Vital signs and labs have been reviewed by MD - ANC (1.3), Creatinine, LFTs, hemoglobin, and platelets are within treatment parameters per M.D. - pt may proceed with treatment.  Primary RN and pharmacy notified.

## 2022-10-26 NOTE — Patient Instructions (Signed)
[facility]  Discharge Instructions: Thank you for choosing [facility] to provide your oncology and hematology care.  If you have a lab appointment with the Cancer Center - please note that after April 8th, 2024, all labs will be drawn in the cancer center.  You do not have to check in or register with the main entrance as you have in the past but will complete your check-in in the cancer center.  Wear comfortable clothing and clothing appropriate for easy access to any Portacath or PICC line.   We strive to give you quality time with your provider. You may need to reschedule your appointment if you arrive late (15 or more minutes).  Arriving late affects you and other patients whose appointments are after yours.  Also, if you miss three or more appointments without notifying the office, you may be dismissed from the clinic at the provider's discretion.      For prescription refill requests, have your pharmacy contact our office and allow 72 hours for refills to be completed.    Today you received the following chemotherapy and/or immunotherapy agents MVASI/Oxaliplatin/Leucovorin/5FU.  Bevacizumab Injection What is this medication? BEVACIZUMAB (be va SIZ yoo mab) treats some types of cancer. It works by blocking a protein that causes cancer cells to grow and multiply. This helps to slow or stop the spread of cancer cells. It is a monoclonal antibody. This medicine may be used for other purposes; ask your health care provider or pharmacist if you have questions. COMMON BRAND NAME(S): Alymsys, Avastin, MVASI, Omer Jack What should I tell my care team before I take this medication? They need to know if you have any of these conditions: Blood clots Coughing up blood Having or recent surgery Heart failure High blood pressure History of a connection between 2 or more body parts that do not usually connect (fistula) History of a tear in your stomach or  intestines Protein in your urine An unusual or allergic reaction to bevacizumab, other medications, foods, dyes, or preservatives Pregnant or trying to get pregnant Breast-feeding How should I use this medication? This medication is injected into a vein. It is given by your care team in a hospital or clinic setting. Talk to your care team the use of this medication in children. Special care may be needed. Overdosage: If you think you have taken too much of this medicine contact a poison control center or emergency room at once. NOTE: This medicine is only for you. Do not share this medicine with others. What if I miss a dose? Keep appointments for follow-up doses. It is important not to miss your dose. Call your care team if you are unable to keep an appointment. What may interact with this medication? Interactions are not expected. This list may not describe all possible interactions. Give your health care provider a list of all the medicines, herbs, non-prescription drugs, or dietary supplements you use. Also tell them if you smoke, drink alcohol, or use illegal drugs. Some items may interact with your medicine. What should I watch for while using this medication? Your condition will be monitored carefully while you are receiving this medication. You may need blood work while taking this medication. This medication may make you feel generally unwell. This is not uncommon as chemotherapy can affect healthy cells as well as cancer cells. Report any side effects. Continue your course of treatment even though you feel ill unless your care team tells you to stop. This  medication may increase your risk to bruise or bleed. Call your care team if you notice any unusual bleeding. Before having surgery, talk to your care team to make sure it is ok. This medication can increase the risk of poor healing of your surgical site or wound. You will need to stop this medication for 28 days before surgery. After  surgery, wait at least 28 days before restarting this medication. Make sure the surgical site or wound is healed enough before restarting this medication. Talk to your care team if questions. Talk to your care team if you may be pregnant. Serious birth defects can occur if you take this medication during pregnancy and for 6 months after the last dose. Contraception is recommended while taking this medication and for 6 months after the last dose. Your care team can help you find the option that works for you. Do not breastfeed while taking this medication and for 6 months after the last dose. This medication can cause infertility. Talk to your care team if you are concerned about your fertility. What side effects may I notice from receiving this medication? Side effects that you should report to your care team as soon as possible: Allergic reactions--skin rash, itching, hives, swelling of the face, lips, tongue, or throat Bleeding--bloody or black, tar-like stools, vomiting blood or Dhani Dannemiller material that looks like coffee grounds, red or dark Margel Joens urine, small red or purple spots on skin, unusual bruising or bleeding Blood clot--pain, swelling, or warmth in the leg, shortness of breath, chest pain Heart attack--pain or tightness in the chest, shoulders, arms, or jaw, nausea, shortness of breath, cold or clammy skin, feeling faint or lightheaded Heart failure--shortness of breath, swelling of the ankles, feet, or hands, sudden weight gain, unusual weakness or fatigue Increase in blood pressure Infection--fever, chills, cough, sore throat, wounds that don't heal, pain or trouble when passing urine, general feeling of discomfort or being unwell Infusion reactions--chest pain, shortness of breath or trouble breathing, feeling faint or lightheaded Kidney injury--decrease in the amount of urine, swelling of the ankles, hands, or feet Stomach pain that is severe, does not go away, or gets worse Stroke--sudden  numbness or weakness of the face, arm, or leg, trouble speaking, confusion, trouble walking, loss of balance or coordination, dizziness, severe headache, change in vision Sudden and severe headache, confusion, change in vision, seizures, which may be signs of posterior reversible encephalopathy syndrome (PRES) Side effects that usually do not require medical attention (report to your care team if they continue or are bothersome): Back pain Change in taste Diarrhea Dry skin Increased tears Nosebleed This list may not describe all possible side effects. Call your doctor for medical advice about side effects. You may report side effects to FDA at 1-800-FDA-1088. Where should I keep my medication? This medication is given in a hospital or clinic. It will not be stored at home. NOTE: This sheet is a summary. It may not cover all possible information. If you have questions about this medicine, talk to your doctor, pharmacist, or health care provider.  2024 Elsevier/Gold Standard (2021-07-30 00:00:00)    Oxaliplatin Injection What is this medication? OXALIPLATIN (ox AL i PLA tin) treats colorectal cancer. It works by slowing down the growth of cancer cells. This medicine may be used for other purposes; ask your health care provider or pharmacist if you have questions. COMMON BRAND NAME(S): Eloxatin What should I tell my care team before I take this medication? They need to know if you  have any of these conditions: Heart disease History of irregular heartbeat or rhythm Liver disease Low blood cell levels (white cells, red cells, and platelets) Lung or breathing disease, such as asthma Take medications that treat or prevent blood clots Tingling of the fingers, toes, or other nerve disorder An unusual or allergic reaction to oxaliplatin, other medications, foods, dyes, or preservatives If you or your partner are pregnant or trying to get pregnant Breast-feeding How should I use this  medication? This medication is injected into a vein. It is given by your care team in a hospital or clinic setting. Talk to your care team about the use of this medication in children. Special care may be needed. Overdosage: If you think you have taken too much of this medicine contact a poison control center or emergency room at once. NOTE: This medicine is only for you. Do not share this medicine with others. What if I miss a dose? Keep appointments for follow-up doses. It is important not to miss a dose. Call your care team if you are unable to keep an appointment. What may interact with this medication? Do not take this medication with any of the following: Cisapride Dronedarone Pimozide Thioridazine This medication may also interact with the following: Aspirin and aspirin-like medications Certain medications that treat or prevent blood clots, such as warfarin, apixaban, dabigatran, and rivaroxaban Cisplatin Cyclosporine Diuretics Medications for infection, such as acyclovir, adefovir, amphotericin B, bacitracin, cidofovir, foscarnet, ganciclovir, gentamicin, pentamidine, vancomycin NSAIDs, medications for pain and inflammation, such as ibuprofen or naproxen Other medications that cause heart rhythm changes Pamidronate Zoledronic acid This list may not describe all possible interactions. Give your health care provider a list of all the medicines, herbs, non-prescription drugs, or dietary supplements you use. Also tell them if you smoke, drink alcohol, or use illegal drugs. Some items may interact with your medicine. What should I watch for while using this medication? Your condition will be monitored carefully while you are receiving this medication. You may need blood work while taking this medication. This medication may make you feel generally unwell. This is not uncommon as chemotherapy can affect healthy cells as well as cancer cells. Report any side effects. Continue your course  of treatment even though you feel ill unless your care team tells you to stop. This medication may increase your risk of getting an infection. Call your care team for advice if you get a fever, chills, sore throat, or other symptoms of a cold or flu. Do not treat yourself. Try to avoid being around people who are sick. Avoid taking medications that contain aspirin, acetaminophen, ibuprofen, naproxen, or ketoprofen unless instructed by your care team. These medications may hide a fever. Be careful brushing or flossing your teeth or using a toothpick because you may get an infection or bleed more easily. If you have any dental work done, tell your dentist you are receiving this medication. This medication can make you more sensitive to cold. Do not drink cold drinks or use ice. Cover exposed skin before coming in contact with cold temperatures or cold objects. When out in cold weather wear warm clothing and cover your mouth and nose to warm the air that goes into your lungs. Tell your care team if you get sensitive to the cold. Talk to your care team if you or your partner are pregnant or think either of you might be pregnant. This medication can cause serious birth defects if taken during pregnancy and for 9 months after  the last dose. A negative pregnancy test is required before starting this medication. A reliable form of contraception is recommended while taking this medication and for 9 months after the last dose. Talk to your care team about effective forms of contraception. Do not father a child while taking this medication and for 6 months after the last dose. Use a condom while having sex during this time period. Do not breastfeed while taking this medication and for 3 months after the last dose. This medication may cause infertility. Talk to your care team if you are concerned about your fertility. What side effects may I notice from receiving this medication? Side effects that you should report to  your care team as soon as possible: Allergic reactions--skin rash, itching, hives, swelling of the face, lips, tongue, or throat Bleeding--bloody or black, tar-like stools, vomiting blood or Josefina Rynders material that looks like coffee grounds, red or dark Telicia Hodgkiss urine, small red or purple spots on skin, unusual bruising or bleeding Dry cough, shortness of breath or trouble breathing Heart rhythm changes--fast or irregular heartbeat, dizziness, feeling faint or lightheaded, chest pain, trouble breathing Infection--fever, chills, cough, sore throat, wounds that don't heal, pain or trouble when passing urine, general feeling of discomfort or being unwell Liver injury--right upper belly pain, loss of appetite, nausea, light-colored stool, dark yellow or Cella Cappello urine, yellowing skin or eyes, unusual weakness or fatigue Low red blood cell level--unusual weakness or fatigue, dizziness, headache, trouble breathing Muscle injury--unusual weakness or fatigue, muscle pain, dark yellow or Phenix Grein urine, decrease in amount of urine Pain, tingling, or numbness in the hands or feet Sudden and severe headache, confusion, change in vision, seizures, which may be signs of posterior reversible encephalopathy syndrome (PRES) Unusual bruising or bleeding Side effects that usually do not require medical attention (report to your care team if they continue or are bothersome): Diarrhea Nausea Pain, redness, or swelling with sores inside the mouth or throat Unusual weakness or fatigue Vomiting This list may not describe all possible side effects. Call your doctor for medical advice about side effects. You may report side effects to FDA at 1-800-FDA-1088. Where should I keep my medication? This medication is given in a hospital or clinic. It will not be stored at home. NOTE: This sheet is a summary. It may not cover all possible information. If you have questions about this medicine, talk to your doctor, pharmacist, or health care  provider.  2024 Elsevier/Gold Standard (2021-12-28 00:00:00)    Leucovorin Injection What is this medication? LEUCOVORIN (loo koe VOR in) prevents side effects from certain medications, such as methotrexate. It works by increasing folate levels. This helps protect healthy cells in your body. It may also be used to treat anemia caused by low levels of folate. It can also be used with fluorouracil, a type of chemotherapy, to treat colorectal cancer. It works by increasing the effects of fluorouracil in the body. This medicine may be used for other purposes; ask your health care provider or pharmacist if you have questions. What should I tell my care team before I take this medication? They need to know if you have any of these conditions: Anemia from low levels of vitamin B12 in the blood An unusual or allergic reaction to leucovorin, folic acid, other medications, foods, dyes, or preservatives Pregnant or trying to get pregnant Breastfeeding How should I use this medication? This medication is injected into a vein or a muscle. It is given by your care team in a hospital  or clinic setting. Talk to your care team about the use of this medication in children. Special care may be needed. Overdosage: If you think you have taken too much of this medicine contact a poison control center or emergency room at once. NOTE: This medicine is only for you. Do not share this medicine with others. What if I miss a dose? Keep appointments for follow-up doses. It is important not to miss your dose. Call your care team if you are unable to keep an appointment. What may interact with this medication? Capecitabine Fluorouracil Phenobarbital Phenytoin Primidone Trimethoprim;sulfamethoxazole This list may not describe all possible interactions. Give your health care provider a list of all the medicines, herbs, non-prescription drugs, or dietary supplements you use. Also tell them if you smoke, drink alcohol,  or use illegal drugs. Some items may interact with your medicine. What should I watch for while using this medication? Your condition will be monitored carefully while you are receiving this medication. This medication may increase the side effects of 5-fluorouracil. Tell your care team if you have diarrhea or mouth sores that do not get better or that get worse. What side effects may I notice from receiving this medication? Side effects that you should report to your care team as soon as possible: Allergic reactions--skin rash, itching, hives, swelling of the face, lips, tongue, or throat This list may not describe all possible side effects. Call your doctor for medical advice about side effects. You may report side effects to FDA at 1-800-FDA-1088. Where should I keep my medication? This medication is given in a hospital or clinic. It will not be stored at home. NOTE: This sheet is a summary. It may not cover all possible information. If you have questions about this medicine, talk to your doctor, pharmacist, or health care provider.  2024 Elsevier/Gold Standard (2021-08-17 00:00:00)    Fluorouracil Injection What is this medication? FLUOROURACIL (flure oh YOOR a sil) treats some types of cancer. It works by slowing down the growth of cancer cells. This medicine may be used for other purposes; ask your health care provider or pharmacist if you have questions. COMMON BRAND NAME(S): Adrucil What should I tell my care team before I take this medication? They need to know if you have any of these conditions: Blood disorders Dihydropyrimidine dehydrogenase (DPD) deficiency Infection, such as chickenpox, cold sores, herpes Kidney disease Liver disease Poor nutrition Recent or ongoing radiation therapy An unusual or allergic reaction to fluorouracil, other medications, foods, dyes, or preservatives If you or your partner are pregnant or trying to get pregnant Breast-feeding How should I  use this medication? This medication is injected into a vein. It is administered by your care team in a hospital or clinic setting. Talk to your care team about the use of this medication in children. Special care may be needed. Overdosage: If you think you have taken too much of this medicine contact a poison control center or emergency room at once. NOTE: This medicine is only for you. Do not share this medicine with others. What if I miss a dose? Keep appointments for follow-up doses. It is important not to miss your dose. Call your care team if you are unable to keep an appointment. What may interact with this medication? Do not take this medication with any of the following: Live virus vaccines This medication may also interact with the following: Medications that treat or prevent blood clots, such as warfarin, enoxaparin, dalteparin This list may not describe  all possible interactions. Give your health care provider a list of all the medicines, herbs, non-prescription drugs, or dietary supplements you use. Also tell them if you smoke, drink alcohol, or use illegal drugs. Some items may interact with your medicine. What should I watch for while using this medication? Your condition will be monitored carefully while you are receiving this medication. This medication may make you feel generally unwell. This is not uncommon as chemotherapy can affect healthy cells as well as cancer cells. Report any side effects. Continue your course of treatment even though you feel ill unless your care team tells you to stop. In some cases, you may be given additional medications to help with side effects. Follow all directions for their use. This medication may increase your risk of getting an infection. Call your care team for advice if you get a fever, chills, sore throat, or other symptoms of a cold or flu. Do not treat yourself. Try to avoid being around people who are sick. This medication may increase  your risk to bruise or bleed. Call your care team if you notice any unusual bleeding. Be careful brushing or flossing your teeth or using a toothpick because you may get an infection or bleed more easily. If you have any dental work done, tell your dentist you are receiving this medication. Avoid taking medications that contain aspirin, acetaminophen, ibuprofen, naproxen, or ketoprofen unless instructed by your care team. These medications may hide a fever. Do not treat diarrhea with over the counter products. Contact your care team if you have diarrhea that lasts more than 2 days or if it is severe and watery. This medication can make you more sensitive to the sun. Keep out of the sun. If you cannot avoid being in the sun, wear protective clothing and sunscreen. Do not use sun lamps, tanning beds, or tanning booths. Talk to your care team if you or your partner wish to become pregnant or think you might be pregnant. This medication can cause serious birth defects if taken during pregnancy and for 3 months after the last dose. A reliable form of contraception is recommended while taking this medication and for 3 months after the last dose. Talk to your care team about effective forms of contraception. Do not father a child while taking this medication and for 3 months after the last dose. Use a condom while having sex during this time period. Do not breastfeed while taking this medication. This medication may cause infertility. Talk to your care team if you are concerned about your fertility. What side effects may I notice from receiving this medication? Side effects that you should report to your care team as soon as possible: Allergic reactions--skin rash, itching, hives, swelling of the face, lips, tongue, or throat Heart attack--pain or tightness in the chest, shoulders, arms, or jaw, nausea, shortness of breath, cold or clammy skin, feeling faint or lightheaded Heart failure--shortness of breath,  swelling of the ankles, feet, or hands, sudden weight gain, unusual weakness or fatigue Heart rhythm changes--fast or irregular heartbeat, dizziness, feeling faint or lightheaded, chest pain, trouble breathing High ammonia level--unusual weakness or fatigue, confusion, loss of appetite, nausea, vomiting, seizures Infection--fever, chills, cough, sore throat, wounds that don't heal, pain or trouble when passing urine, general feeling of discomfort or being unwell Low red blood cell level--unusual weakness or fatigue, dizziness, headache, trouble breathing Pain, tingling, or numbness in the hands or feet, muscle weakness, change in vision, confusion or trouble speaking, loss of  balance or coordination, trouble walking, seizures Redness, swelling, and blistering of the skin over hands and feet Severe or prolonged diarrhea Unusual bruising or bleeding Side effects that usually do not require medical attention (report to your care team if they continue or are bothersome): Dry skin Headache Increased tears Nausea Pain, redness, or swelling with sores inside the mouth or throat Sensitivity to light Vomiting This list may not describe all possible side effects. Call your doctor for medical advice about side effects. You may report side effects to FDA at 1-800-FDA-1088. Where should I keep my medication? This medication is given in a hospital or clinic. It will not be stored at home. NOTE: This sheet is a summary. It may not cover all possible information. If you have questions about this medicine, talk to your doctor, pharmacist, or health care provider.  2024 Elsevier/Gold Standard (2021-07-20 00:00:00)        To help prevent nausea and vomiting after your treatment, we encourage you to take your nausea medication as directed.  BELOW ARE SYMPTOMS THAT SHOULD BE REPORTED IMMEDIATELY: *FEVER GREATER THAN 100.4 F (38 C) OR HIGHER *CHILLS OR SWEATING *NAUSEA AND VOMITING THAT IS NOT CONTROLLED  WITH YOUR NAUSEA MEDICATION *UNUSUAL SHORTNESS OF BREATH *UNUSUAL BRUISING OR BLEEDING *URINARY PROBLEMS (pain or burning when urinating, or frequent urination) *BOWEL PROBLEMS (unusual diarrhea, constipation, pain near the anus) TENDERNESS IN MOUTH AND THROAT WITH OR WITHOUT PRESENCE OF ULCERS (sore throat, sores in mouth, or a toothache) UNUSUAL RASH, SWELLING OR PAIN  UNUSUAL VAGINAL DISCHARGE OR ITCHING   Items with * indicate a potential emergency and should be followed up as soon as possible or go to the Emergency Department if any problems should occur.  Please show the CHEMOTHERAPY ALERT CARD or IMMUNOTHERAPY ALERT CARD at check-in to the Emergency Department and triage nurse.  Should you have questions after your visit or need to cancel or reschedule your appointment, please contact [facility] CENTER AT [facility] [phone]  and follow the prompts.  Office hours are 8:00 a.m. to 4:30 p.m. Monday - Friday. Please note that voicemails left after 4:00 p.m. may not be returned until the following business day.  We are closed weekends and major holidays. You have access to a nurse at all times for urgent questions. Please call the main number to the clinic [phone] and follow the prompts.  For any non-urgent questions, you may also contact your provider using MyChart. We now offer e-Visits for anyone 77 and older to request care online for non-urgent symptoms. For details visit [email or web address].   Also download the MyChart app! Go to the app store, search "MyChart", open the app, select [place], and log in with your MyChart username and password.

## 2022-10-26 NOTE — Telephone Encounter (Signed)
Nurses-I was able to send in the amitriptyline as well as alprazolam I would recommend a wellness visit within the next 90 days then we can update all her medicines again at that time as for the thyroid medicine if he is unable to update it she can let us know and we can send in updates thank you

## 2022-10-26 NOTE — Progress Notes (Signed)
Patient presents today for chemotherapy infusion. Patient is in satisfactory condition with no new complaints voiced.  Vital signs are stable.  Labs reviewed by Dr. Ellin Saba during the office visit.  ANC today is 1.3, potassium is 3.3, and bilirubin is 2.3.  Urine protein is negative.  MD aware.  We will give Klor Con 40 mEq PO x one dose today per standing orders by Dr. Ellin Saba.  All other labs are within treatment parameters.   We will proceed with treatment per MD orders.   1244:  Patient's husband notified nurse of a possible reaction.  Infusion was immediately stopped and NS was started.  Patient c/o itching in her ears and heart racing.  Vitals were 133/83, HR 116 and O2 sats of 100%.   1247- MD at bedside.  MD ordered an additional Ativan 0.5 mg IV x one dose and to restart infusion once HR returned to normal.  1255:  Ativan 0.5 mg IV given.  1300:  RN noticed patient's face was very flushed.  Patient denied any new symptoms.    1315:  MD at bedside again.  Dr. Ellin Saba ordered an additional Solu-Medrol 125 mg IV x one dose.  MD stated that once flushing improved, treatment could be restarted.    1350:  Flushing improved.  Ok to restart Oxaliplatin/Leucovorin per Dr. Ellin Saba.   1605:  Patient tolerated the remainder of her treatment with no issues.  Home infusion 5FU pump was connected with no issues.  Patient left ambulatory with husband in stable condition.

## 2022-10-27 ENCOUNTER — Other Ambulatory Visit: Payer: Self-pay

## 2022-10-27 ENCOUNTER — Other Ambulatory Visit (HOSPITAL_COMMUNITY): Payer: Self-pay

## 2022-10-28 ENCOUNTER — Inpatient Hospital Stay: Payer: 59 | Attending: Hematology

## 2022-10-28 ENCOUNTER — Encounter (HOSPITAL_COMMUNITY): Payer: Self-pay

## 2022-10-28 DIAGNOSIS — C20 Malignant neoplasm of rectum: Secondary | ICD-10-CM | POA: Insufficient documentation

## 2022-10-28 DIAGNOSIS — Z79631 Long term (current) use of antimetabolite agent: Secondary | ICD-10-CM | POA: Diagnosis not present

## 2022-10-28 DIAGNOSIS — Z5111 Encounter for antineoplastic chemotherapy: Secondary | ICD-10-CM | POA: Insufficient documentation

## 2022-10-28 DIAGNOSIS — Z79899 Other long term (current) drug therapy: Secondary | ICD-10-CM | POA: Insufficient documentation

## 2022-10-28 DIAGNOSIS — Z5112 Encounter for antineoplastic immunotherapy: Secondary | ICD-10-CM | POA: Insufficient documentation

## 2022-10-28 MED ORDER — PEGFILGRASTIM-JMDB 6 MG/0.6ML ~~LOC~~ SOSY
6.0000 mg | PREFILLED_SYRINGE | Freq: Once | SUBCUTANEOUS | Status: AC
  Administered 2022-10-28: 6 mg via SUBCUTANEOUS

## 2022-10-28 MED ORDER — SODIUM CHLORIDE 0.9% FLUSH
10.0000 mL | INTRAVENOUS | Status: DC | PRN
  Administered 2022-10-28: 10 mL

## 2022-10-28 MED ORDER — HEPARIN SOD (PORK) LOCK FLUSH 100 UNIT/ML IV SOLN
500.0000 [IU] | Freq: Once | INTRAVENOUS | Status: AC | PRN
  Administered 2022-10-28: 500 [IU]

## 2022-10-28 NOTE — Progress Notes (Signed)
Patient presents today for 5FU pump stop and disconnection after 46 hour continous infusion.   5FU pump deaccessed.  Patients port flushed without difficulty.  Good blood return noted with no bruising or swelling noted at site.  Needle removed intact.  Band aid applied.  VSS with discharge and left in satisfactory condition via wheelchair with no s/s of distress noted.     Fuphilia administration without incident; injection site WNL; see MAR for injection details.  Patient tolerated procedure well and without incident.  No questions or complaints noted at this time.

## 2022-10-28 NOTE — Patient Instructions (Signed)
[facility]  Discharge Instructions: Thank you for choosing [facility] to provide your oncology and hematology care.  If you have a lab appointment with the Cancer Center - please note that after April 8th, 2024, all labs will be drawn in the cancer center.  You do not have to check in or register with the main entrance as you have in the past but will complete your check-in in the cancer center.  Wear comfortable clothing and clothing appropriate for easy access to any Portacath or PICC line.   We strive to give you quality time with your provider. You may need to reschedule your appointment if you arrive late (15 or more minutes).  Arriving late affects you and other patients whose appointments are after yours.  Also, if you miss three or more appointments without notifying the office, you may be dismissed from the clinic at the provider's discretion.      For prescription refill requests, have your pharmacy contact our office and allow 72 hours for refills to be completed.    Today you received the following chemotherapy and/or immunotherapy agents 5FU Pump stop     and Fulphila To help prevent nausea and vomiting after your treatment, we encourage you to take your nausea medication as directed.  BELOW ARE SYMPTOMS THAT SHOULD BE REPORTED IMMEDIATELY: *FEVER GREATER THAN 100.4 F (38 C) OR HIGHER *CHILLS OR SWEATING *NAUSEA AND VOMITING THAT IS NOT CONTROLLED WITH YOUR NAUSEA MEDICATION *UNUSUAL SHORTNESS OF BREATH *UNUSUAL BRUISING OR BLEEDING *URINARY PROBLEMS (pain or burning when urinating, or frequent urination) *BOWEL PROBLEMS (unusual diarrhea, constipation, pain near the anus) TENDERNESS IN MOUTH AND THROAT WITH OR WITHOUT PRESENCE OF ULCERS (sore throat, sores in mouth, or a toothache) UNUSUAL RASH, SWELLING OR PAIN  UNUSUAL VAGINAL DISCHARGE OR ITCHING   Items with * indicate a potential emergency and should be followed up as soon as  possible or go to the Emergency Department if any problems should occur.  Please show the CHEMOTHERAPY ALERT CARD or IMMUNOTHERAPY ALERT CARD at check-in to the Emergency Department and triage nurse.  Should you have questions after your visit or need to cancel or reschedule your appointment, please contact [facility] CENTER AT [facility] [phone]  and follow the prompts.  Office hours are 8:00 a.m. to 4:30 p.m. Monday - Friday. Please note that voicemails left after 4:00 p.m. may not be returned until the following business day.  We are closed weekends and major holidays. You have access to a nurse at all times for urgent questions. Please call the main number to the clinic [phone] and follow the prompts.  For any non-urgent questions, you may also contact your provider using MyChart. We now offer e-Visits for anyone 57 and older to request care online for non-urgent symptoms. For details visit [email or web address].   Also download the MyChart app! Go to the app store, search "MyChart", open the app, select [place], and log in with your MyChart username and password.

## 2022-10-31 ENCOUNTER — Other Ambulatory Visit (HOSPITAL_COMMUNITY): Payer: Self-pay

## 2022-11-02 ENCOUNTER — Encounter: Payer: Self-pay | Admitting: Hematology

## 2022-11-02 ENCOUNTER — Encounter (HOSPITAL_COMMUNITY): Payer: Self-pay | Admitting: Hematology

## 2022-11-02 ENCOUNTER — Other Ambulatory Visit (HOSPITAL_COMMUNITY): Payer: Self-pay

## 2022-11-02 ENCOUNTER — Other Ambulatory Visit: Payer: Self-pay

## 2022-11-02 MED ORDER — THYROID 15 MG PO TABS
15.0000 mg | ORAL_TABLET | Freq: Every day | ORAL | 3 refills | Status: DC | PRN
  Filled 2022-11-02: qty 30, 30d supply, fill #0

## 2022-11-02 MED ORDER — ARMOUR THYROID 60 MG PO TABS
60.0000 mg | ORAL_TABLET | Freq: Every day | ORAL | 1 refills | Status: DC
  Filled 2022-11-02: qty 90, 90d supply, fill #0
  Filled 2023-05-03: qty 90, 90d supply, fill #1

## 2022-11-03 ENCOUNTER — Ambulatory Visit (HOSPITAL_COMMUNITY)
Admission: RE | Admit: 2022-11-03 | Discharge: 2022-11-03 | Disposition: A | Discharge status: Home / Self Care | Payer: 59 | Source: Ambulatory Visit | Attending: Hematology | Admitting: Hematology

## 2022-11-03 ENCOUNTER — Other Ambulatory Visit (HOSPITAL_COMMUNITY): Payer: Self-pay

## 2022-11-03 DIAGNOSIS — C20 Malignant neoplasm of rectum: Secondary | ICD-10-CM | POA: Diagnosis not present

## 2022-11-03 MED ORDER — FLUDEOXYGLUCOSE F - 18 (FDG) INJECTION
7.6800 | Freq: Once | INTRAVENOUS | Status: AC | PRN
  Administered 2022-11-03: 7.68 via INTRAVENOUS

## 2022-11-04 ENCOUNTER — Encounter: Payer: Self-pay | Admitting: Family Medicine

## 2022-11-04 NOTE — Telephone Encounter (Signed)
Nurses I have not seen any old records at this point from her thyroid specialist Please check with the front regarding this Then send to [place] response If need be have our front office send a request for these records to Dr.Moryati thyroid specialist I believe he practices in [place]

## 2022-11-05 DIAGNOSIS — C787 Secondary malignant neoplasm of liver and intrahepatic bile duct: Secondary | ICD-10-CM | POA: Diagnosis not present

## 2022-11-05 DIAGNOSIS — C20 Malignant neoplasm of rectum: Secondary | ICD-10-CM | POA: Diagnosis not present

## 2022-11-07 NOTE — Telephone Encounter (Signed)
See other telephone message 

## 2022-11-08 NOTE — Progress Notes (Signed)
[facility] [address] [place] [address]    Clinic Day:  12/13/22    Referring physician: Babs Sciara, MD  Patient Care Team: Babs Sciara, MD as PCP - General (Family Medicine) [name], MD as Medical Oncologist (Medical Oncology)   ASSESSMENT & PLAN:   Assessment: 1.  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  Liver biopsy on 03/09/2018 consistent with adenocarcinoma. 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 -7 cycles of FOLFOX with vectibix completed on 06/27/2018. -XRT with Xeloda from 07/30/2018 through 09/06/2018. -Right liver lesion microwave ablation on 10/15/2018 at [facility]. -Low anterior resection and diverting ileostomy on 12/07/2018, pathology [id number], 0/6 lymph nodes positive, margins negative. -Loop ileostomy reversal on 04/17/2019. - Microwave ablation of the right hepatic lobe lesion by Dr. Selena Batten around 08/15/2019. -SBRT to the left lower lobe lung lesion and left liver lobe lesion on 12/13/2019 at [facility] - Right upper lobe lesion SBRT from 08/06/2020 through 08/13/2020. - 3 left lung lesions and subcarinal lymph node IMRT from 09/21/2021 through 10/05/2021. - Liver biopsy (06/09/2022): Metastatic moderately differentiated colonic adenocarcinoma. - NGS: KRAS/NRAS/BRAF wild-type, MSI-stable, HER2-0, negative for other targetable mutations - Cycle 1 of FOLFOX and bevacizumab on 07/06/2022 - XRT to the right neck from 07/14/2022 through 08/03/2022    Plan: 1.  Stage IV rectal adenocarcinoma, BRAF/RAS wild-type, MSI-stable: - After last treatment, she had cold sensitivity and hiccups which lasted 1 and half day. - Last treatment on 10/26/2022 with FOLFOX and bevacizumab. - She also had occasional diarrhea and had to take Imodium 1 tablet daily. - Reviewed labs today: White count 3.0.  Platelet count is 60. - Will hold oxaliplatin today.  She will  proceed with 5-FU and bevacizumab.  Will check CEA level today.  RTC 2 weeks for follow-up.   2.  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   3.  Normocytic anemia: - Hemoglobin is 11.1.  Last ferritin 89 and percent saturation 36.   4.  Leukopenia and thrombocytopenia: - Intermittent leukopenia and thrombocytopenia exacerbated by splenomegaly.   5.  Brain metastasis: - Status post SRS for the anterior right frontal lesion.  She will have MRI scheduled.     Orders Placed This Encounter  Procedures   CEA    Standing Status:   Standing    Number of Occurrences:   10    Standing Expiration Date:   11/09/2023   Magnesium    Standing Status:   Future    Number of Occurrences:   1    Standing Expiration Date:   11/23/2023   CBC with Differential    Standing Status:   Future    Number of Occurrences:   1    Standing Expiration Date:   11/23/2023   Comprehensive metabolic panel    Standing Status:   Future    Number of Occurrences:   1    Standing Expiration Date:   11/23/2023      Alben Deeds Teague,acting as a scribe for [name], MD.,have documented all relevant documentation on the behalf of [name], MD,as directed by  [name], MD while in the presence of [name], MD.  I, [name] MD, have reviewed the above documentation for accuracy and completeness, and I agree with the above.      [name], MD   9/17/20245:59 PM  CHIEF COMPLAINT:   Diagnosis: metastatic rectal cancer  to liver    Cancer Staging  Malignant neoplasm of rectum [facility]) Staging form: Colon and Rectum, AJCC 8th Edition - Clinical stage from 03/13/2018: Stage IVA (cT3, cN1b, cM1a) - Signed by [name], MD on 03/13/2018    Prior Therapy: 1. FOLFOX & vectibix x 7 cycles from 03/14/2018 to 06/27/2018. 2. XRT with Xeloda from 07/30/2018 to 09/06/2018. 3. Right liver lesion microwave ablation on  10/15/2018. 4. Low anterior resection and diverting ileostomy on 12/07/2018. 5. Right hepatic lobe microwave ablation on 08/15/19 6. SBRT to LLL and liver 11/15/2019 -12/13/2019. 7. SBRT to RUL 08/06/20 - 08/13/20 8. IMRT to 3 left lung lesions and subcarinal LN 09/21/21 - 10/05/21 9. XRT to C6 07/14/22 - 08/03/22  Current Therapy:  FOLFOX and bevacizumab    HISTORY OF PRESENT ILLNESS:   Oncology History  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HCC)  02/26/2018 Initial Diagnosis   Rectal cancer (HCC)   03/13/2018 Cancer Staging   Staging form: Colon and Rectum, AJCC 8th Edition - Clinical stage from 03/13/2018: Stage IVA (cT3, cN1b, cM1a) - Signed by [name], MD on 03/13/2018   03/14/2018 - 06/29/2018 Chemotherapy   The patient had palonosetron (ALOXI) injection 0.25 mg, 0.25 mg, Intravenous,  Once, 7 of 8 cycles Administration: 0.25 mg (03/14/2018), 0.25 mg (03/27/2018), 0.25 mg (04/18/2018), 0.25 mg (05/02/2018), 0.25 mg (05/16/2018), 0.25 mg (06/05/2018), 0.25 mg (06/27/2018) leucovorin 800 mg in dextrose 5 % 250 mL infusion, 772 mg, Intravenous,  Once, 7 of 8 cycles Administration: 800 mg (03/14/2018), 800 mg (03/27/2018), 800 mg (05/02/2018), 800 mg (05/16/2018), 700 mg (04/18/2018), 800 mg (06/05/2018), 800 mg (06/27/2018) oxaliplatin (ELOXATIN) 165 mg in dextrose 5 % 500 mL chemo infusion, 85 mg/m2 = 165 mg, Intravenous,  Once, 6 of 7 cycles Dose modification: 68 mg/m2 (80 % of original dose 85 mg/m2, Cycle 4, Reason: Provider Judgment) Administration: 165 mg (03/14/2018), 165 mg (03/27/2018), 130 mg (05/02/2018), 130 mg (05/16/2018), 130 mg (06/05/2018), 130 mg (06/27/2018) panitumumab (VECTIBIX) 500 mg in sodium chloride 0.9 % 100 mL chemo infusion, 480 mg, Intravenous,  Once, 4 of 5 cycles Administration: 500 mg (04/18/2018), 480 mg (05/02/2018), 480 mg (05/16/2018), 480 mg (06/05/2018) fluorouracil (ADRUCIL) chemo injection 750 mg, 400 mg/m2 = 750 mg (100 % of original dose 400 mg/m2), Intravenous,  Once,  6 of 7 cycles Dose modification: 400 mg/m2 (original dose 400 mg/m2, Cycle 1), 400 mg/m2 (original dose 400 mg/m2, Cycle 3) Administration: 750 mg (03/14/2018), 750 mg (04/18/2018), 750 mg (05/02/2018), 750 mg (05/16/2018), 750 mg (06/05/2018), 750 mg (06/27/2018) fosaprepitant (EMEND) 150 mg, dexamethasone (DECADRON) 12 mg in sodium chloride 0.9 % 145 mL IVPB, , Intravenous,  Once, 4 of 5 cycles Administration:  (05/02/2018),  (05/16/2018),  (06/05/2018),  (06/27/2018) fluorouracil (ADRUCIL) 4,650 mg in sodium chloride 0.9 % 57 mL chemo infusion, 2,400 mg/m2 = 4,650 mg, Intravenous, 1 Day/Dose, 7 of 8 cycles Administration: 4,650 mg (03/14/2018), 4,650 mg (03/27/2018), 4,650 mg (04/18/2018), 4,650 mg (05/02/2018), 4,650 mg (05/16/2018), 4,650 mg (06/05/2018), 4,650 mg (06/27/2018)  for chemotherapy treatment.    07/06/2022 -  Chemotherapy   Patient is on Treatment Plan : COLORECTAL FOLFOX + Bevacizumab q14d        INTERVAL HISTORY:   Samantha Reynolds is a 57 y.o. female presenting to clinic today for follow up of metastatic rectal cancer to liver. She was last seen by me on 10/26/22.  Since her last visit, she underwent a restaging PET on 11/03/22 that found: interval resolution of hypermetabolic activity in the LEFT hepatic  lobe consistent with treatment response; interval resolution of hypermetabolic adrenal metastasis; I interval resolution of hypermetabolic C7 skeletal metastasis; interval development of hypermetabolic nodule in the RIGHT middle lobe; stable mildly hypermetabolic nodule in the LEFT upper lobe; and interval  development of diffuse increased marrow activity throughout the skeleton.   Today, she states that she is doing well overall. Her appetite level is at 90%. Her energy level is at 75%. She is accompanied by her husband.  She reports of a cold sensitivity 2-3 days following FOLFOX treatment. She notes she had on and off hiccups for 1.5 days after treatment, not treated with anything.   She c/o  swallowing issues for 1 week, that have resolved, due to her potassium pills being too large for her to swallow and getting stuck in her throat, causing irritation in the esophagus.   She notes 2 days of constipation immediately followed by 2 days of diarrhea, where she goes 15x a day, after treatment with Xeloda. She reports one instance of severe diarrhea, but notes that every other time following treatment she had mild constipation and diarrhea. She treats symptoms with Imodium 1x a day at night.   She notes she has a recurrent cough every 2-3 weeks, in which she will a few days of productive coughing with phlegm that is occasionally colored. The cough resolves after producing a small amount of phlegm. She denies hemoptysis with cough.   PAST MEDICAL HISTORY:   Past Medical History: Past Medical History:  Diagnosis Date   Anxiety    Cancer (HCC) 2013   thyroid   Endometrial polyp    Endometrial thickening on ultra sound    GERD (gastroesophageal reflux disease)    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   Worse in her 1s when she was ill   H/O Hashimoto thyroiditis    History of chemotherapy    History of radiation therapy    History of thyroid cancer no recurrence   2013--  s/p  left lobe thyroidectomy--  follicular varient papillary / lymphocyctic thyroiditis   Hyperlipidemia 11/09/2015   Hypothyroidism    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HCC) 02/26/2018    Surgical History: Past Surgical History:  Procedure Laterality Date   BIOPSY  02/19/2018   Procedure: BIOPSY;  Surgeon: Malissa Hippo, MD;  [facility];  Service: Endoscopy;;  rectum   COLONOSCOPY N/A 08/20/2015   Procedure: COLONOSCOPY;  Surgeon: Malissa Hippo, MD;  [facility];  Service: Endoscopy;  Laterality: N/A;  730   COLONOSCOPY WITH PROPOFOL N/A 07/21/2021   Procedure: COLONOSCOPY WITH PROPOFOL;  Surgeon: Malissa Hippo, MD;  [facility];  Service: Endoscopy;  Laterality: N/A;  10:10   D  & C HYSTEROOSCOPY W/ THERMACHOICE ENDOMETRIAL ABLATION  08-13-2004   DIVERTING ILEOSTOMY N/A 12/07/2018   Procedure: DIVERTING LOOP ILEOSTOMY;  Surgeon: Romie Levee, MD;  Location: [facility];  Service: General;  Laterality: N/A;   FLEXIBLE SIGMOIDOSCOPY N/A 02/19/2018   Procedure: FLEXIBLE SIGMOIDOSCOPY;  Surgeon: Malissa Hippo, MD;  [facility];  Service: Endoscopy;  Laterality: N/A;   HYSTEROSCOPY WITH D & C N/A 04/30/2015   Procedure: DILATATION AND CURETTAGE / INTENDED HYSTEROSCOPY;  Surgeon: Marcelle Overlie, MD;  Location: [facility];  Service: Gynecology;  Laterality: N/A;   ILEOSTOMY CLOSURE N/A 04/17/2019   Procedure: LOOP ILEOSTOMY REVERSAL;  Surgeon: Romie Levee, MD;  Location: [facility];  Service: General;  Laterality: N/A;   IR US GUIDE BX ASP/DRAIN  03/09/2018   LAPAROSCOPIC CHOLECYSTECTOMY  04/1996   LAPAROSCOPY N/A 12/11/2018   Procedure: LAPAROSCOPY  ILEOSTOMY REVISION AND ABDOMINAL WASHOUT;  Surgeon: Romie Levee, MD;  Location: [facility];  Service: General;  Laterality: N/A;   Liver Microwave   10/17/2018   POLYPECTOMY  08/20/2015   Procedure: POLYPECTOMY;  Surgeon: Malissa Hippo, MD;  [facility];  Service: Endoscopy;;  Splenic flexure polypectomy   POLYPECTOMY  02/19/2018   Procedure: POLYPECTOMY;  Surgeon: Malissa Hippo, MD;  [facility];  Service: Endoscopy;;  rectum   PORTACATH PLACEMENT N/A 03/14/2018   Procedure: INSERTION PORT-A-CATH;  Surgeon: Franky Macho, MD;  Location: [facility];  Service: General;  Laterality: N/A;   REDUCTION INCARCERATED UTERUS  06-20-2000   intrauterine preg. 13 wks /  urinary retention   THYROID LOBECTOMY  11/24/2011   Procedure: THYROID LOBECTOMY;  Surgeon: Velora Heckler, MD;  Location: [facility];  Service: General;  Laterality: Left;  Left Thyroid Lobectomy   TUBAL LIGATION  2002   XI ROBOTIC ASSISTED LOWER ANTERIOR RESECTION N/A 12/07/2018   Procedure: XI ROBOTIC ASSISTED LOWER ANTERIOR  RESECTION, SPENIC FLEXURE IMMOBILIZATION, COLOANAL ANASTOMOSIS, RIGID PROCTOSCOPY;  Surgeon: Romie Levee, MD;  Location: [facility];  Service: General;  Laterality: N/A;    Social History: Social History   Socioeconomic History   Marital status: Married    Spouse name: Not on file   Number of children: 4   Years of education: Not on file   Highest education level: Not on file  Occupational History    Comment: Systems developer at [employer]  Tobacco Use   Smoking status: Former    Current packs/day: 0.00    Average packs/day: 0.3 packs/day for 10.0 years (2.5 ttl pk-yrs)    Types: Cigarettes    Start date: 10/30/1981    Quit date: 10/31/1991    Years since quitting: 31.1   Smokeless tobacco: Never  Vaping Use   Vaping status: Never Used  Substance and Sexual Activity   Alcohol use: No   Drug use: No   Sexual activity: Not Currently  Other Topics Concern   Not on file  Social History Narrative   Lives with husband Loraine Leriche and 79 year old son Almeta Monas   Husband is Technical brewer of [employer]   Social Determinants of Health   Financial Resource Strain: Low Risk  (02/26/2018)   Overall Financial Resource Strain (CARDIA)    Difficulty of Paying Living Expenses: Not hard at all  Food Insecurity: No Food Insecurity (12/05/2022)   Hunger Vital Sign    Worried About Running Out of Food in the Last Year: Never true    Ran Out of Food in the Last Year: Never true  Transportation Needs: No Transportation Needs (12/05/2022)   PRAPARE - Administrator, Civil Service (Medical): No    Lack of Transportation (Non-Medical): No  Physical Activity: Inactive (02/26/2018)   Exercise Vital Sign    Days of Exercise per Week: 0 days    Minutes of Exercise per Session: 0 min  Stress: Stress Concern Present (02/26/2018)   [employer] of Occupational Health - Occupational Stress Questionnaire    Feeling of Stress : To some extent  Social Connections: Socially Integrated (02/26/2018)   Social  Connection and Isolation Panel [NHANES]    Frequency of Communication with Friends and Family: More than three times a week    Frequency of Social Gatherings with Friends and Family: Twice a week  Attends Religious Services: More than 4 times per year    Active Member of Clubs or Organizations: Yes    Attends Banker Meetings: More than 4 times per year    Marital Status: Married  Catering manager Violence: Not At Risk (12/05/2022)   Humiliation, Afraid, Rape, and Kick questionnaire    Fear of Current or Ex-Partner: No    Emotionally Abused: No    Physically Abused: No    Sexually Abused: No    Family History: Family History  Problem Relation Age of Onset   Hypertension Mother    Hypertension Father    Prostate cancer Father    Cancer Maternal Aunt        spine/back   Cancer Paternal Grandfather        lung   Healthy Son    Healthy Son    Healthy Son    Healthy Son    Colon cancer Neg Hx     Current Medications:  Current Outpatient Medications:    ALPRAZolam (XANAX) 0.5 MG tablet, Take 1 tablet (0.5 mg total) by mouth 3 (three) times daily as needed for anxiety., Disp: 90 tablet, Rfl: 2   amitriptyline (ELAVIL) 10 MG tablet, Take 1 tablet (10 mg total) by mouth 2 (two) times daily., Disp: 180 tablet, Rfl: 3   ARMOUR THYROID 60 MG tablet, Take 1 tablet (60 mg total) by mouth daily., Disp: 90 tablet, Rfl: 1   ARMOUR THYROID 60 MG tablet, Take 1 tablet (60 mg total) by mouth daily., Disp: 90 tablet, Rfl: 1   ARMOUR THYROID 60 MG tablet, Take 1 tablet (60 mg total) by mouth daily., Disp: 90 tablet, Rfl: 1   loperamide (IMODIUM) 2 MG capsule, Take 1 capsule (2 mg total) by mouth 3 (three) times daily. (Patient taking differently: Take 2 mg by mouth daily.), Disp: 30 capsule, Rfl: 0   pregabalin (LYRICA) 25 MG capsule, Take 1 capsule (25 mg total) by mouth 2 (two) times daily., Disp: 60 capsule, Rfl: 3   prochlorperazine (COMPAZINE) 10 MG tablet, Take 1 tablet (10 mg  total) by mouth every 6 (six) hours as needed for nausea or vomiting., Disp: 30 tablet, Rfl: 4   sucralfate (CARAFATE) 1 g tablet, Take 1 tablet (1 g total) by mouth 4 (four) times daily -  with meals and at bedtime. Crush 1 tablet in 1 oz water and drink 5 min before meals for radiation induced esophagitis, Disp: 120 tablet, Rfl: 2   thyroid (ARMOUR THYROID) 15 MG tablet, Take 1 tablet (15 mg total) by mouth daily as needed with 60 mg armour thyroid tablet (75 mg total)., Disp: 30 tablet, Rfl: 3   tiZANidine (ZANAFLEX) 4 MG tablet, Take 1 tablet (4 mg total) by mouth every 6 (six) hours as needed for muscle spasms (take for tightness and pain in the scapular region)., Disp: 56 tablet, Rfl: 2 No current facility-administered medications for this visit.  Facility-Administered Medications Ordered in Other Visits:    clindamycin (CLEOCIN) 900 mg in dextrose 5 % 50 mL IVPB, 900 mg, Intravenous, 60 min Pre-Op **AND** gentamicin (GARAMYCIN) 350 mg in dextrose 5 % 50 mL IVPB, 5 mg/kg, Intravenous, 60 min Pre-Op, Romie Levee, MD   clindamycin (CLEOCIN) 900 mg in dextrose 5 % 50 mL IVPB, 900 mg, Intravenous, 60 min Pre-Op **AND** gentamicin (GARAMYCIN) 5 mg/kg in dextrose 5 % 50 mL IVPB, 5 mg/kg, Intravenous, 60 min Pre-Op, Romie Levee, MD   sodium chloride 0.9 % 1,000 mL with  potassium chloride 20 mEq, magnesium sulfate 2 g infusion, , Intravenous, Once, [name], MD   Allergies: Allergies  Allergen Reactions   Oxaliplatin Other (See Comments)    Patient had hypersensitivity reaction to Oxaliplatin. Patient c/o itching in her ears and eyes. Pt also c/o being hot, flushed, dizzy with redness on pt's face, arms, and legs. Pt c/o tingling in her hands and legs. Pt also had a dry throat with coughing. See progress note from 09/07/22 at 1130. Patient was not able to complete Oxaliplatin infusion.   Second reaction 10/26/2022. Itching in ears, heart racing, & face flushing. See progress note from  10/26/2022. Able to complete infusion after medications.   Demerol [Meperidine] Itching    All over the body.   Penicillins Hives     childhood, does not remember if it spread all over the body or not. Did it involve swelling of the face/tongue/throat, SOB, or low BP? No Did it involve sudden or severe rash/hives, skin peeling, or any reaction on the inside of your mouth or nose? Yes Did you need to seek medical attention at a hospital or doctor's office? Yes When did it last happen?      childhood allergy If all above answers are "NO", may proceed with cephalosporin use.    Levaquin [Levofloxacin] Other (See Comments)    Patient has Aortic Aneurysm and is not indicated with this diagnosis   Other Hives and Other (See Comments)    Fresh coconut    Vancomycin Rash    Will need Benadryl prior to administration per IV. "Red man syndrome"    REVIEW OF SYSTEMS:   Review of Systems  Constitutional:  Negative for chills, fatigue and fever.  HENT:   Positive for trouble swallowing. Negative for lump/mass, mouth sores, nosebleeds and sore throat.   Eyes:  Negative for eye problems.  Respiratory:  Negative for cough and shortness of breath.   Cardiovascular:  Negative for chest pain, leg swelling and palpitations.  Gastrointestinal:  Negative for abdominal pain, constipation, diarrhea, nausea and vomiting.  Genitourinary:  Negative for bladder incontinence, difficulty urinating, dysuria, frequency, hematuria and nocturia.   Musculoskeletal:  Negative for arthralgias, back pain, flank pain, myalgias and neck pain.  Skin:  Negative for itching and rash.  Neurological:  Negative for dizziness, headaches and numbness.  Hematological:  Does not bruise/bleed easily.  Psychiatric/Behavioral:  Positive for depression. Negative for sleep disturbance and suicidal ideas. The patient is nervous/anxious.   All other systems reviewed and are negative.    VITALS:   Last menstrual period 02/16/2015.  Wt  Readings from Last 3 Encounters:  12/07/22 134 lb 14.4 oz (61.2 kg)  11/23/22 135 lb 6.4 oz (61.4 kg)  11/22/22 136 lb (61.7 kg)    There is no height or weight on file to calculate BMI.  Performance status (ECOG): 1 - Symptomatic but completely ambulatory  PHYSICAL EXAM:   Physical Exam Vitals and nursing note reviewed. Exam conducted with a chaperone present.  Constitutional:      Appearance: Normal appearance.  Cardiovascular:     Rate and Rhythm: Normal rate and regular rhythm.     Pulses: Normal pulses.     Heart sounds: Normal heart sounds.  Pulmonary:     Effort: Pulmonary effort is normal.     Breath sounds: Normal breath sounds.  Abdominal:     Palpations: Abdomen is soft. There is no hepatomegaly, splenomegaly or mass.     Tenderness: There is no abdominal tenderness.  Musculoskeletal:     Right lower leg: No edema.     Left lower leg: No edema.  Lymphadenopathy:     Cervical: No cervical adenopathy.     Right cervical: No superficial, deep or posterior cervical adenopathy.    Left cervical: No superficial, deep or posterior cervical adenopathy.     Upper Body:     Right upper body: No supraclavicular or axillary adenopathy.     Left upper body: No supraclavicular or axillary adenopathy.  Neurological:     General: No focal deficit present.     Mental Status: She is alert and oriented to person, place, and time.  Psychiatric:        Mood and Affect: Mood normal.        Behavior: Behavior normal.     LABS:      Latest Ref Rng & Units 12/07/2022    9:43 AM 11/22/2022    1:43 PM 11/09/2022    8:58 AM  CBC  WBC 4.0 - 10.5 K/uL 2.7  2.6  3.0   Hemoglobin 12.0 - 15.0 g/dL 16.1  09.6  04.5   Hematocrit 36.0 - 46.0 % 34.0  31.8  32.8   Platelets 150 - 400 K/uL 59  69  60       Latest Ref Rng & Units 12/07/2022    9:43 AM 11/22/2022    1:43 PM 11/09/2022    8:58 AM  CMP  Glucose 70 - 99 mg/dL 88  90  89   BUN 6 - 20 mg/dL 12  12  8    Creatinine 0.44 - 1.00  mg/dL 4.09  8.11  9.14   Sodium 135 - 145 mmol/L 136  134  136   Potassium 3.5 - 5.1 mmol/L 3.2  3.5  3.2   Chloride 98 - 111 mmol/L 104  103  106   CO2 22 - 32 mmol/L 23  23  22    Calcium 8.9 - 10.3 mg/dL 8.8  8.7  8.4   Total Protein 6.5 - 8.1 g/dL 7.0  6.7  6.8   Total Bilirubin 0.3 - 1.2 mg/dL 3.6  3.0  1.4   Alkaline Phos 38 - 126 U/L 60  65  77   AST 15 - 41 U/L 20  25  21    ALT 0 - 44 U/L 14  16  14       Lab Results  Component Value Date   CEA1 4.7 12/07/2022   /  CEA  Date Value Ref Range Status  12/07/2022 4.7 0.0 - 4.7 ng/mL Final    Comment:    (NOTE)                             Nonsmokers          <3.9                             Smokers             <5.6 Roche [employer] Electrochemiluminescence Immunoassay (ECLIA) Values obtained with different assay methods or kits cannot be used interchangeably.  Results cannot be interpreted as absolute evidence of the presence or absence of malignant disease. Performed At: [facility] [address] [place], [place] [id number] Jolene Schimke MD [id number]    No results found for: "PSA1" No results found for: "CAN199" No results found for: "CAN125"  No results  found for: "TOTALPROTELP", "ALBUMINELP", "A1GS", "A2GS", "BETS", "BETA2SER", "GAMS", "MSPIKE", "SPEI" Lab Results  Component Value Date   TIBC 333 09/07/2022   TIBC 364 05/11/2022   TIBC 385 11/09/2020   FERRITIN 89 09/07/2022   FERRITIN 61 05/11/2022   FERRITIN 54 11/09/2020   IRONPCTSAT 36 (H) 09/07/2022   IRONPCTSAT 26 05/11/2022   IRONPCTSAT 22 11/09/2020   Lab Results  Component Value Date   LDH 154 01/20/2020     STUDIES:   MR Brain W Wo Contrast  Result Date: 12/02/2022 CLINICAL DATA:  Brain metastases, assess treatment response. EXAM: MRI HEAD WITHOUT AND WITH CONTRAST TECHNIQUE: Multiplanar, multiecho pulse sequences of the brain and surrounding structures were obtained without and with intravenous contrast. CONTRAST:  7.69mL  GADAVIST GADOBUTROL 1 MMOL/ML IV SOLN COMPARISON:  Brain MRI 08/18/2022 FINDINGS: Brain: The previously seen enhancing lesion in the right frontal lobe subcortical white matter has resolved. There are no new or enlarging lesions. There is no acute intracranial hemorrhage, extra-axial fluid collection, or acute infarct. Parenchymal volume is normal. The ventricles are normal in size. There is a minimal background chronic small-vessel ischemic change The pituitary and suprasellar region are normal. There is no mass effect or midline shift. Vascular: Normal flow voids. Skull and upper cervical spine: The subcentimeter enhancing lesion in the left frontal calvarium is unchanged. Sinuses/Orbits: The paranasal sinuses are clear. The globes and orbits are unremarkable. Other: The mastoid air cells and middle ear cavities are clear. IMPRESSION: 1. The previously seen enhancing lesion in the right frontal white matter has resolved. No new lesions. 2. Unchanged subcentimeter enhancing lesion in the left frontal calvarium. Electronically Signed   By: Lesia Hausen M.D.   On: 12/02/2022 15:54

## 2022-11-09 ENCOUNTER — Inpatient Hospital Stay: Payer: 59

## 2022-11-09 ENCOUNTER — Inpatient Hospital Stay (HOSPITAL_BASED_OUTPATIENT_CLINIC_OR_DEPARTMENT_OTHER): Payer: 59 | Admitting: Hematology

## 2022-11-09 DIAGNOSIS — C20 Malignant neoplasm of rectum: Secondary | ICD-10-CM

## 2022-11-09 DIAGNOSIS — Z5111 Encounter for antineoplastic chemotherapy: Secondary | ICD-10-CM | POA: Diagnosis not present

## 2022-11-09 DIAGNOSIS — Z5112 Encounter for antineoplastic immunotherapy: Secondary | ICD-10-CM | POA: Diagnosis not present

## 2022-11-09 DIAGNOSIS — E876 Hypokalemia: Secondary | ICD-10-CM

## 2022-11-09 DIAGNOSIS — Z79899 Other long term (current) drug therapy: Secondary | ICD-10-CM | POA: Diagnosis not present

## 2022-11-09 DIAGNOSIS — Z79631 Long term (current) use of antimetabolite agent: Secondary | ICD-10-CM | POA: Diagnosis not present

## 2022-11-09 LAB — CBC WITH DIFFERENTIAL/PLATELET
Abs Immature Granulocytes: 0.09 10*3/uL — ABNORMAL HIGH (ref 0.00–0.07)
Basophils Absolute: 0 10*3/uL (ref 0.0–0.1)
Basophils Relative: 0 %
Eosinophils Absolute: 0 10*3/uL (ref 0.0–0.5)
Eosinophils Relative: 1 %
HCT: 32.8 % — ABNORMAL LOW (ref 36.0–46.0)
Hemoglobin: 10.9 g/dL — ABNORMAL LOW (ref 12.0–15.0)
Immature Granulocytes: 3 %
Lymphocytes Relative: 17 %
Lymphs Abs: 0.5 10*3/uL — ABNORMAL LOW (ref 0.7–4.0)
MCH: 34.7 pg — ABNORMAL HIGH (ref 26.0–34.0)
MCHC: 33.2 g/dL (ref 30.0–36.0)
MCV: 104.5 fL — ABNORMAL HIGH (ref 80.0–100.0)
Monocytes Absolute: 0.2 10*3/uL (ref 0.1–1.0)
Monocytes Relative: 6 %
Neutro Abs: 2.2 10*3/uL (ref 1.7–7.7)
Neutrophils Relative %: 73 %
Platelets: 60 10*3/uL — ABNORMAL LOW (ref 150–400)
RBC: 3.14 MIL/uL — ABNORMAL LOW (ref 3.87–5.11)
RDW: 14.3 % (ref 11.5–15.5)
WBC: 3 10*3/uL — ABNORMAL LOW (ref 4.0–10.5)
nRBC: 0 % (ref 0.0–0.2)

## 2022-11-09 LAB — URINALYSIS, DIPSTICK ONLY
Bilirubin Urine: NEGATIVE
Glucose, UA: NEGATIVE mg/dL
Hgb urine dipstick: NEGATIVE
Ketones, ur: NEGATIVE mg/dL
Nitrite: NEGATIVE
Protein, ur: NEGATIVE mg/dL
Specific Gravity, Urine: 1.008 (ref 1.005–1.030)
pH: 5 (ref 5.0–8.0)

## 2022-11-09 LAB — COMPREHENSIVE METABOLIC PANEL
ALT: 14 U/L (ref 0–44)
AST: 21 U/L (ref 15–41)
Albumin: 3.7 g/dL (ref 3.5–5.0)
Alkaline Phosphatase: 77 U/L (ref 38–126)
Anion gap: 8 (ref 5–15)
BUN: 8 mg/dL (ref 6–20)
CO2: 22 mmol/L (ref 22–32)
Calcium: 8.4 mg/dL — ABNORMAL LOW (ref 8.9–10.3)
Chloride: 106 mmol/L (ref 98–111)
Creatinine, Ser: 0.85 mg/dL (ref 0.44–1.00)
GFR, Estimated: >60 mL/min (ref >60)
Glucose, Bld: 89 mg/dL (ref 70–99)
Potassium: 3.2 mmol/L — ABNORMAL LOW (ref 3.5–5.1)
Sodium: 136 mmol/L (ref 135–145)
Total Bilirubin: 1.4 mg/dL — ABNORMAL HIGH (ref 0.3–1.2)
Total Protein: 6.8 g/dL (ref 6.5–8.1)

## 2022-11-09 LAB — MAGNESIUM: Magnesium: 2.1 mg/dL (ref 1.7–2.4)

## 2022-11-09 MED ORDER — SODIUM CHLORIDE 0.9 % IV SOLN
20.0000 mg | Freq: Once | INTRAVENOUS | Status: AC
  Administered 2022-11-09: 20 mg via INTRAVENOUS
  Filled 2022-11-09: qty 2

## 2022-11-09 MED ORDER — SODIUM CHLORIDE 0.9 % IV SOLN
5.0000 mg/kg | Freq: Once | INTRAVENOUS | Status: AC
  Administered 2022-11-09: 300 mg via INTRAVENOUS
  Filled 2022-11-09: qty 12

## 2022-11-09 MED ORDER — PALONOSETRON HCL INJECTION 0.25 MG/5ML
0.2500 mg | Freq: Once | INTRAVENOUS | Status: AC
  Administered 2022-11-09: 0.25 mg via INTRAVENOUS
  Filled 2022-11-09: qty 5

## 2022-11-09 MED ORDER — FAMOTIDINE IN NACL 20-0.9 MG/50ML-% IV SOLN
20.0000 mg | Freq: Once | INTRAVENOUS | Status: AC
  Administered 2022-11-09: 20 mg via INTRAVENOUS
  Filled 2022-11-09: qty 50

## 2022-11-09 MED ORDER — SODIUM CHLORIDE 0.9 % IV SOLN: 40.0000 mg | Freq: Two times a day (BID) | INTRAVENOUS | Status: DC

## 2022-11-09 MED ORDER — SODIUM CHLORIDE 0.9 % IV SOLN
1920.0000 mg/m2 | INTRAVENOUS | Status: DC
  Administered 2022-11-09: 3500 mg via INTRAVENOUS
  Filled 2022-11-09: qty 70

## 2022-11-09 MED ORDER — FLUOROURACIL CHEMO INJECTION 500 MG/10ML
320.0000 mg/m2 | Freq: Once | INTRAVENOUS | Status: AC
  Administered 2022-11-09: 500 mg via INTRAVENOUS
  Filled 2022-11-09: qty 10

## 2022-11-09 MED ORDER — POTASSIUM CHLORIDE ER 10 MEQ PO TBCR
40.0000 meq | EXTENDED_RELEASE_TABLET | Freq: Once | ORAL | Status: AC
  Administered 2022-11-09: 40 meq via ORAL
  Filled 2022-11-09: qty 4

## 2022-11-09 MED ORDER — SODIUM CHLORIDE 0.9 % IV SOLN: Freq: Once | INTRAVENOUS | Status: AC

## 2022-11-09 MED ORDER — SODIUM CHLORIDE 0.9 % IV SOLN
320.0000 mg/m2 | Freq: Once | INTRAVENOUS | Status: AC
  Administered 2022-11-09: 540 mg via INTRAVENOUS
  Filled 2022-11-09: qty 27

## 2022-11-09 NOTE — Patient Instructions (Addendum)
[facility] at [facility] Discharge Instructions   You were seen and examined today by Dr. Ellin Saba.  He reviewed the results of your lab work which are normal/stable.   He reviewed the results of your PET scan which shows a good response to treatment.   We will proceed with your treatment today.   Return as scheduled.    Thank you for choosing [facility] at [facility] to provide your oncology and hematology care.  To afford each patient quality time with our provider, please arrive at least 15 minutes before your scheduled appointment time.   If you have a lab appointment with the Cancer Center please come in thru the Main Entrance and check in at the main information desk.  You need to re-schedule your appointment should you arrive 10 or more minutes late.  We strive to give you quality time with our providers, and arriving late affects you and other patients whose appointments are after yours.  Also, if you no show three or more times for appointments you may be dismissed from the clinic at the providers discretion.     Again, thank you for choosing [facility].  Our hope is that these requests will decrease the amount of time that you wait before being seen by our physicians.       _____________________________________________________________  Should you have questions after your visit to [facility], please contact our office at [phone] and follow the prompts.  Our office hours are 8:00 a.m. and 4:30 p.m. Monday - Friday.  Please note that voicemails left after 4:00 p.m. may not be returned until the following business day.  We are closed weekends and major holidays.  You do have access to a nurse 24-7, just call the main number to the clinic [phone] and do not press any options, hold on the line and a nurse will answer the phone.    For prescription refill requests, have your pharmacy contact our  office and allow 72 hours.    Due to Covid, you will need to wear a mask upon entering the hospital. If you do not have a mask, a mask will be given to you at the Main Entrance upon arrival. For doctor visits, patients may have 1 support person age 38 or older with them. For treatment visits, patients can not have anyone with them due to social distancing guidelines and our immunocompromised population.

## 2022-11-09 NOTE — Progress Notes (Signed)
Patient has been examined by Dr. Ellin Saba. Vital signs and labs have been reviewed by MD - ANC, Creatinine, LFTs, hemoglobin, and platelets (60,000) are within treatment parameters per M.D. - pt may proceed with treatment. Hold oxaliplatin today per MD d/t thrombocytopenia. Primary RN and pharmacy notified.

## 2022-11-09 NOTE — Progress Notes (Signed)
Labs reviewed today at office visit with Dr. Kirtland Bouchard. Will hold oxaliplatin due to  platelets at 60,000 per MD. Potassium noted at 3.2, will give PO K+ per protocol.   Treatment given per orders. Patient tolerated it well without problems. Vitals stable and discharged home from clinic ambulatory. Follow up as scheduled.

## 2022-11-09 NOTE — Patient Instructions (Signed)
[facility]  Discharge Instructions: Thank you for choosing [facility] to provide your oncology and hematology care.  If you have a lab appointment with the Cancer Center - please note that after April 8th, 2024, all labs will be drawn in the cancer center.  You do not have to check in or register with the main entrance as you have in the past but will complete your check-in in the cancer center.  Wear comfortable clothing and clothing appropriate for easy access to any Portacath or PICC line.   We strive to give you quality time with your provider. You may need to reschedule your appointment if you arrive late (15 or more minutes).  Arriving late affects you and other patients whose appointments are after yours.  Also, if you miss three or more appointments without notifying the office, you may be dismissed from the clinic at the provider's discretion.      For prescription refill requests, have your pharmacy contact our office and allow 72 hours for refills to be completed.    Today you received the following chemotherapy and/or immunotherapy agents , MVASI, leucovorin, Fluorouracil    To help prevent nausea and vomiting after your treatment, we encourage you to take your nausea medication as directed.  BELOW ARE SYMPTOMS THAT SHOULD BE REPORTED IMMEDIATELY: *FEVER GREATER THAN 100.4 F (38 C) OR HIGHER *CHILLS OR SWEATING *NAUSEA AND VOMITING THAT IS NOT CONTROLLED WITH YOUR NAUSEA MEDICATION *UNUSUAL SHORTNESS OF BREATH *UNUSUAL BRUISING OR BLEEDING *URINARY PROBLEMS (pain or burning when urinating, or frequent urination) *BOWEL PROBLEMS (unusual diarrhea, constipation, pain near the anus) TENDERNESS IN MOUTH AND THROAT WITH OR WITHOUT PRESENCE OF ULCERS (sore throat, sores in mouth, or a toothache) UNUSUAL RASH, SWELLING OR PAIN  UNUSUAL VAGINAL DISCHARGE OR ITCHING   Items with * indicate a potential emergency and should be followed up as soon as  possible or go to the Emergency Department if any problems should occur.  Please show the CHEMOTHERAPY ALERT CARD or IMMUNOTHERAPY ALERT CARD at check-in to the Emergency Department and triage nurse.  Should you have questions after your visit or need to cancel or reschedule your appointment, please contact [facility] [phone]  and follow the prompts.  Office hours are 8:00 a.m. to 4:30 p.m. Monday - Friday. Please note that voicemails left after 4:00 p.m. may not be returned until the following business day.  We are closed weekends and major holidays. You have access to a nurse at all times for urgent questions. Please call the main number to the clinic [phone] and follow the prompts.  For any non-urgent questions, you may also contact your provider using MyChart. We now offer e-Visits for anyone 82 and older to request care online for non-urgent symptoms. For details visit [email or web address].   Also download the MyChart app! Go to the app store, search "MyChart", open the app, select [place], and log in with your MyChart username and password.

## 2022-11-10 LAB — CEA: CEA: 5.6 ng/mL — ABNORMAL HIGH (ref 0.0–4.7)

## 2022-11-11 ENCOUNTER — Inpatient Hospital Stay: Payer: 59

## 2022-11-11 VITALS — BP 107/70 | HR 85 | Temp 97.4°F | Resp 18

## 2022-11-11 DIAGNOSIS — Z79899 Other long term (current) drug therapy: Secondary | ICD-10-CM | POA: Diagnosis not present

## 2022-11-11 DIAGNOSIS — C20 Malignant neoplasm of rectum: Secondary | ICD-10-CM

## 2022-11-11 DIAGNOSIS — Z79631 Long term (current) use of antimetabolite agent: Secondary | ICD-10-CM | POA: Diagnosis not present

## 2022-11-11 DIAGNOSIS — Z5111 Encounter for antineoplastic chemotherapy: Secondary | ICD-10-CM | POA: Diagnosis not present

## 2022-11-11 DIAGNOSIS — Z5112 Encounter for antineoplastic immunotherapy: Secondary | ICD-10-CM | POA: Diagnosis not present

## 2022-11-11 MED ORDER — SODIUM CHLORIDE 0.9% FLUSH
10.0000 mL | INTRAVENOUS | Status: DC | PRN
  Administered 2022-11-11: 10 mL

## 2022-11-11 MED ORDER — HEPARIN SOD (PORK) LOCK FLUSH 100 UNIT/ML IV SOLN
500.0000 [IU] | Freq: Once | INTRAVENOUS | Status: AC | PRN
  Administered 2022-11-11: 500 [IU]

## 2022-11-11 NOTE — Progress Notes (Signed)
Patient presents today for pump d/c. Vital signs are stable. Port a cath site clean, dry, and intact. Port flushed with 10 mls of Normal Saline and 500 Units of Heparin. Needle removed intact. Band aid applied. Patient has no complaints at this time. Discharged from clinic ambulatory and in stable condition. Patient alert and oriented.  

## 2022-11-12 ENCOUNTER — Other Ambulatory Visit: Payer: Self-pay

## 2022-11-22 ENCOUNTER — Inpatient Hospital Stay: Payer: 59

## 2022-11-22 VITALS — BP 117/75 | HR 95 | Temp 99.9°F | Resp 18 | Wt 136.0 lb

## 2022-11-22 DIAGNOSIS — Z95828 Presence of other vascular implants and grafts: Secondary | ICD-10-CM

## 2022-11-22 DIAGNOSIS — Z79631 Long term (current) use of antimetabolite agent: Secondary | ICD-10-CM | POA: Diagnosis not present

## 2022-11-22 DIAGNOSIS — Z5111 Encounter for antineoplastic chemotherapy: Secondary | ICD-10-CM | POA: Diagnosis not present

## 2022-11-22 DIAGNOSIS — C20 Malignant neoplasm of rectum: Secondary | ICD-10-CM | POA: Diagnosis not present

## 2022-11-22 DIAGNOSIS — Z5112 Encounter for antineoplastic immunotherapy: Secondary | ICD-10-CM | POA: Diagnosis not present

## 2022-11-22 DIAGNOSIS — Z79899 Other long term (current) drug therapy: Secondary | ICD-10-CM | POA: Diagnosis not present

## 2022-11-22 LAB — CBC WITH DIFFERENTIAL/PLATELET
Abs Immature Granulocytes: 0.02 10*3/uL (ref 0.00–0.07)
Basophils Absolute: 0 10*3/uL (ref 0.0–0.1)
Basophils Relative: 0 %
Eosinophils Absolute: 0 10*3/uL (ref 0.0–0.5)
Eosinophils Relative: 0 %
HCT: 31.8 % — ABNORMAL LOW (ref 36.0–46.0)
Hemoglobin: 10.5 g/dL — ABNORMAL LOW (ref 12.0–15.0)
Immature Granulocytes: 1 %
Lymphocytes Relative: 15 %
Lymphs Abs: 0.4 10*3/uL — ABNORMAL LOW (ref 0.7–4.0)
MCH: 34.7 pg — ABNORMAL HIGH (ref 26.0–34.0)
MCHC: 33 g/dL (ref 30.0–36.0)
MCV: 105 fL — ABNORMAL HIGH (ref 80.0–100.0)
Monocytes Absolute: 0.2 10*3/uL (ref 0.1–1.0)
Monocytes Relative: 9 %
Neutro Abs: 1.9 10*3/uL (ref 1.7–7.7)
Neutrophils Relative %: 75 %
Platelets: 69 10*3/uL — ABNORMAL LOW (ref 150–400)
RBC: 3.03 MIL/uL — ABNORMAL LOW (ref 3.87–5.11)
RDW: 14.7 % (ref 11.5–15.5)
WBC: 2.6 10*3/uL — ABNORMAL LOW (ref 4.0–10.5)
nRBC: 0 % (ref 0.0–0.2)

## 2022-11-22 LAB — COMPREHENSIVE METABOLIC PANEL
ALT: 16 U/L (ref 0–44)
AST: 25 U/L (ref 15–41)
Albumin: 3.8 g/dL (ref 3.5–5.0)
Alkaline Phosphatase: 65 U/L (ref 38–126)
Anion gap: 8 (ref 5–15)
BUN: 12 mg/dL (ref 6–20)
CO2: 23 mmol/L (ref 22–32)
Calcium: 8.7 mg/dL — ABNORMAL LOW (ref 8.9–10.3)
Chloride: 103 mmol/L (ref 98–111)
Creatinine, Ser: 0.78 mg/dL (ref 0.44–1.00)
GFR, Estimated: >60 mL/min (ref >60)
Glucose, Bld: 90 mg/dL (ref 70–99)
Potassium: 3.5 mmol/L (ref 3.5–5.1)
Sodium: 134 mmol/L — ABNORMAL LOW (ref 135–145)
Total Bilirubin: 3 mg/dL — ABNORMAL HIGH (ref 0.3–1.2)
Total Protein: 6.7 g/dL (ref 6.5–8.1)

## 2022-11-22 LAB — MAGNESIUM: Magnesium: 2.2 mg/dL (ref 1.7–2.4)

## 2022-11-22 MED ORDER — SODIUM CHLORIDE 0.9% FLUSH
10.0000 mL | INTRAVENOUS | Status: DC | PRN
  Administered 2022-11-22: 10 mL via INTRAVENOUS

## 2022-11-22 NOTE — Progress Notes (Signed)
[facility] [address], [place] [address]    Clinic Day:  11/23/2022  Referring physician: Babs Sciara, MD  Patient Care Team: Babs Sciara, MD as PCP - General (Family Medicine) [name], MD as Medical Oncologist (Medical Oncology)   ASSESSMENT & PLAN:   Assessment: 1.  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  Liver biopsy on 03/09/2018 consistent with adenocarcinoma. 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 -7 cycles of FOLFOX with vectibix completed on 06/27/2018. -XRT with Xeloda from 07/30/2018 through 09/06/2018. -Right liver lesion microwave ablation on 10/15/2018 at [facility]. -Low anterior resection and diverting ileostomy on 12/07/2018, pathology [id number], 0/6 lymph nodes positive, margins negative. -Loop ileostomy reversal on 04/17/2019. - Microwave ablation of the right hepatic lobe lesion by Dr. Selena Batten around 08/15/2019. -SBRT to the left lower lobe lung lesion and left liver lobe lesion on 12/13/2019 at [facility]. - Right upper lobe lesion SBRT from 08/06/2020 through 08/13/2020. - 3 left lung lesions and subcarinal lymph node IMRT from 09/21/2021 through 10/05/2021. - Liver biopsy (06/09/2022): Metastatic moderately differentiated colonic adenocarcinoma. - NGS: KRAS/NRAS/BRAF wild-type, MSI-stable, HER2-0, negative for other targetable mutations - Cycle 1 of FOLFOX and bevacizumab on 07/06/2022 - XRT to the right neck from 07/14/2022 through 08/03/2022    Plan: 1.  Stage IV rectal adenocarcinoma, BRAF/RAS wild-type, MSI-stable: - At last visit due to thrombocytopenia, I have discontinued oxaliplatin. - She is tolerating treatments well. - She reports runny nose and occasional blood-tinged mucus when she blows. - She has intermittent diarrhea lasting about a week after each cycle.  Towards the end of the second week, she starts to feel better. - Labs  today: Total bilirubin 3.0.  Other LFTs normal.  White count 2.6 with ANC normal.  Platelet count 69.  CEA on 11/09/2022 improved to 5.6. - Recommend proceeding with 5-FU and bevacizumab maintenance today.  Will repeat CEA levels.  If they or below 5, will consider switching maintenance 5-FU and bevacizumab to every 3 weeks.  RTC 2 weeks for follow-up.   2.  Leukopenia and thrombocytopenia: - Intermittent leukopenia and thrombocytopenia exacerbated by splenomegaly.   3.  Brain metastasis: - S/p SRS for 3 mm nodular enhancing lesion in the anterior right frontal lobe.  Continue monitoring with MRI.      Orders Placed This Encounter  Procedures   Magnesium    Standing Status:   Future    Standing Expiration Date:   12/07/2023   CBC with Differential    Standing Status:   Future    Standing Expiration Date:   12/07/2023   Comprehensive metabolic panel    Standing Status:   Future    Standing Expiration Date:   12/07/2023   Urinalysis, dipstick only    Standing Status:   Future    Standing Expiration Date:   12/07/2023   Magnesium    Standing Status:   Future    Standing Expiration Date:   12/21/2023   CBC with Differential    Standing Status:   Future    Standing Expiration Date:   12/21/2023   Comprehensive metabolic panel    Standing Status:   Future    Standing Expiration Date:   12/21/2023   Urinalysis, dipstick only    Standing Status:   Future    Standing Expiration Date:   12/21/2023   Magnesium    Standing Status:   Future  Standing Expiration Date:   01/04/2024   CBC with Differential    Standing Status:   Future    Standing Expiration Date:   01/04/2024   Comprehensive metabolic panel    Standing Status:   Future    Standing Expiration Date:   01/04/2024   Urinalysis, dipstick only    Standing Status:   Future    Standing Expiration Date:   01/04/2024   Magnesium    Standing Status:   Future    Standing Expiration Date:   01/18/2024   CBC with Differential    Standing  Status:   Future    Standing Expiration Date:   01/18/2024   Comprehensive metabolic panel    Standing Status:   Future    Standing Expiration Date:   01/18/2024   Urinalysis, dipstick only    Standing Status:   Future    Standing Expiration Date:   01/18/2024   Magnesium    Standing Status:   Future    Standing Expiration Date:   02/01/2024   CBC with Differential    Standing Status:   Future    Standing Expiration Date:   02/01/2024   Comprehensive metabolic panel    Standing Status:   Future    Standing Expiration Date:   02/01/2024   Urinalysis, dipstick only    Standing Status:   Future    Standing Expiration Date:   02/01/2024      I,Katie Daubenspeck,acting as a scribe for [name], MD.,have documented all relevant documentation on the behalf of [name], MD,as directed by  [name], MD while in the presence of [name], MD.   I, [name] MD, have reviewed the above documentation for accuracy and completeness, and I agree with the above.   [name], MD   8/28/20245:25 PM  CHIEF COMPLAINT:   Diagnosis: metastatic rectal cancer to liver    Cancer Staging  Malignant neoplasm of rectum [facility]) Staging form: Colon and Rectum, AJCC 8th Edition - Clinical stage from 03/13/2018: Stage IVA (cT3, cN1b, cM1a) - Signed by [name], MD on 03/13/2018    Prior Therapy: 1. FOLFOX & vectibix x 7 cycles from 03/14/2018 to 06/27/2018. 2. XRT with Xeloda from 07/30/2018 to 09/06/2018. 3. Right liver lesion microwave ablation on 10/15/2018. 4. Low anterior resection and diverting ileostomy on 12/07/2018. 5. Right hepatic lobe microwave ablation on 08/15/19 6. SBRT to LLL and liver 11/15/2019 -12/13/2019. 7. SBRT to RUL 08/06/20 - 08/13/20 8. IMRT to 3 left lung lesions and subcarinal LN 09/21/21 - 10/05/21 9. XRT to C6 07/14/22 - 08/03/22  Current Therapy:  FOLFOX and bevacizumab    HISTORY OF PRESENT ILLNESS:    Oncology History  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HCC)  02/26/2018 Initial Diagnosis   Rectal cancer (HCC)   03/13/2018 Cancer Staging   Staging form: Colon and Rectum, AJCC 8th Edition - Clinical stage from 03/13/2018: Stage IVA (cT3, cN1b, cM1a) - Signed by [name], MD on 03/13/2018   03/14/2018 - 06/29/2018 Chemotherapy   The patient had palonosetron (ALOXI) injection 0.25 mg, 0.25 mg, Intravenous,  Once, 7 of 8 cycles Administration: 0.25 mg (03/14/2018), 0.25 mg (03/27/2018), 0.25 mg (04/18/2018), 0.25 mg (05/02/2018), 0.25 mg (05/16/2018), 0.25 mg (06/05/2018), 0.25 mg (06/27/2018) leucovorin 800 mg in dextrose 5 % 250 mL infusion, 772 mg, Intravenous,  Once, 7 of 8 cycles Administration: 800 mg (03/14/2018), 800 mg (03/27/2018), 800 mg (05/02/2018), 800 mg (05/16/2018), 700 mg (04/18/2018), 800 mg (06/05/2018), 800 mg (  06/27/2018) oxaliplatin (ELOXATIN) 165 mg in dextrose 5 % 500 mL chemo infusion, 85 mg/m2 = 165 mg, Intravenous,  Once, 6 of 7 cycles Dose modification: 68 mg/m2 (80 % of original dose 85 mg/m2, Cycle 4, Reason: Provider Judgment) Administration: 165 mg (03/14/2018), 165 mg (03/27/2018), 130 mg (05/02/2018), 130 mg (05/16/2018), 130 mg (06/05/2018), 130 mg (06/27/2018) panitumumab (VECTIBIX) 500 mg in sodium chloride 0.9 % 100 mL chemo infusion, 480 mg, Intravenous,  Once, 4 of 5 cycles Administration: 500 mg (04/18/2018), 480 mg (05/02/2018), 480 mg (05/16/2018), 480 mg (06/05/2018) fluorouracil (ADRUCIL) chemo injection 750 mg, 400 mg/m2 = 750 mg (100 % of original dose 400 mg/m2), Intravenous,  Once, 6 of 7 cycles Dose modification: 400 mg/m2 (original dose 400 mg/m2, Cycle 1), 400 mg/m2 (original dose 400 mg/m2, Cycle 3) Administration: 750 mg (03/14/2018), 750 mg (04/18/2018), 750 mg (05/02/2018), 750 mg (05/16/2018), 750 mg (06/05/2018), 750 mg (06/27/2018) fosaprepitant (EMEND) 150 mg, dexamethasone (DECADRON) 12 mg in sodium chloride 0.9 % 145 mL IVPB, , Intravenous,  Once, 4 of 5  cycles Administration:  (05/02/2018),  (05/16/2018),  (06/05/2018),  (06/27/2018) fluorouracil (ADRUCIL) 4,650 mg in sodium chloride 0.9 % 57 mL chemo infusion, 2,400 mg/m2 = 4,650 mg, Intravenous, 1 Day/Dose, 7 of 8 cycles Administration: 4,650 mg (03/14/2018), 4,650 mg (03/27/2018), 4,650 mg (04/18/2018), 4,650 mg (05/02/2018), 4,650 mg (05/16/2018), 4,650 mg (06/05/2018), 4,650 mg (06/27/2018)  for chemotherapy treatment.    07/06/2022 -  Chemotherapy   Patient is on Treatment Plan : COLORECTAL FOLFOX + Bevacizumab q14d        INTERVAL HISTORY:   Samantha Reynolds is a 57 y.o. female presenting to clinic today for follow up of metastatic rectal cancer to liver. She was last seen by me on 11/09/22.  Today, she states that she is doing well overall. Her appetite level is at 70%. Her energy level is at 60%.  PAST MEDICAL HISTORY:   Past Medical History: Past Medical History:  Diagnosis Date   Anxiety    Cancer (HCC) 2013   thyroid   Endometrial polyp    Endometrial thickening on ultra sound    GERD (gastroesophageal reflux disease)    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   Worse in her 59s when she was ill   H/O Hashimoto thyroiditis    History of chemotherapy    History of radiation therapy    History of thyroid cancer no recurrence   2013--  s/p  left lobe thyroidectomy--  follicular varient papillary / lymphocyctic thyroiditis   Hyperlipidemia 11/09/2015   Hypothyroidism    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HCC) 02/26/2018    Surgical History: Past Surgical History:  Procedure Laterality Date   BIOPSY  02/19/2018   Procedure: BIOPSY;  Surgeon: Malissa Hippo, MD;  [facility];  Service: Endoscopy;;  rectum   COLONOSCOPY N/A 08/20/2015   Procedure: COLONOSCOPY;  Surgeon: Malissa Hippo, MD;  [facility];  Service: Endoscopy;  Laterality: N/A;  730   COLONOSCOPY WITH PROPOFOL N/A 07/21/2021   Procedure: COLONOSCOPY WITH PROPOFOL;  Surgeon: Malissa Hippo, MD;  [facility];  Service: Endoscopy;  Laterality: N/A;  10:10   D & C HYSTEROOSCOPY W/ THERMACHOICE ENDOMETRIAL ABLATION  08-13-2004   DIVERTING ILEOSTOMY N/A 12/07/2018   Procedure: DIVERTING LOOP ILEOSTOMY;  Surgeon: Romie Levee, MD;  [facility]: [facility];  Service: General;  Laterality: N/A;   FLEXIBLE SIGMOIDOSCOPY N/A 02/19/2018   Procedure: FLEXIBLE SIGMOIDOSCOPY;  Surgeon: Malissa Hippo, MD;  [facility];  Service: Endoscopy;  Laterality: N/A;   HYSTEROSCOPY WITH D & C N/A 04/30/2015   Procedure: DILATATION AND CURETTAGE / INTENDED HYSTEROSCOPY;  Surgeon: Marcelle Overlie, MD;  Location: [facility];  Service: Gynecology;  Laterality: N/A;   ILEOSTOMY CLOSURE N/A 04/17/2019   Procedure: LOOP ILEOSTOMY REVERSAL;  Surgeon: Romie Levee, MD;  Location: [facility];  Service: General;  Laterality: N/A;   IR US GUIDE BX ASP/DRAIN  03/09/2018   LAPAROSCOPIC CHOLECYSTECTOMY  04/1996   LAPAROSCOPY N/A 12/11/2018   Procedure: LAPAROSCOPY  ILEOSTOMY REVISION AND ABDOMINAL WASHOUT;  Surgeon: Romie Levee, MD;  Location: [facility];  Service: General;  Laterality: N/A;   Liver Microwave   10/17/2018   POLYPECTOMY  08/20/2015   Procedure: POLYPECTOMY;  Surgeon: Malissa Hippo, MD;  [facility];  Service: Endoscopy;;  Splenic flexure polypectomy   POLYPECTOMY  02/19/2018   Procedure: POLYPECTOMY;  Surgeon: Malissa Hippo, MD;  [facility];  Service: Endoscopy;;  rectum   PORTACATH PLACEMENT N/A 03/14/2018   Procedure: INSERTION PORT-A-CATH;  Surgeon: Franky Macho, MD;  Location: [facility];  Service: General;  Laterality: N/A;   REDUCTION INCARCERATED UTERUS  06-20-2000   intrauterine preg. 13 wks /  urinary retention   THYROID LOBECTOMY  11/24/2011   Procedure: THYROID LOBECTOMY;  Surgeon: Velora Heckler, MD;  Location: [facility];  Service: General;  Laterality: Left;  Left Thyroid Lobectomy   TUBAL LIGATION  2002   XI ROBOTIC ASSISTED LOWER ANTERIOR RESECTION N/A  12/07/2018   Procedure: XI ROBOTIC ASSISTED LOWER ANTERIOR RESECTION, SPENIC FLEXURE IMMOBILIZATION, COLOANAL ANASTOMOSIS, RIGID PROCTOSCOPY;  Surgeon: Romie Levee, MD;  Location: [facility];  Service: General;  Laterality: N/A;    Social History: Social History   Socioeconomic History   Marital status: Married    Spouse name: Not on file   Number of children: 4   Years of education: Not on file   Highest education level: Not on file  Occupational History    Comment: Systems developer at [employer]  Tobacco Use   Smoking status: Former    Current packs/day: 0.00    Average packs/day: 0.3 packs/day for 10.0 years (2.5 ttl pk-yrs)    Types: Cigarettes    Start date: 10/30/1981    Quit date: 10/31/1991    Years since quitting: 31.0   Smokeless tobacco: Never  Vaping Use   Vaping status: Never Used  Substance and Sexual Activity   Alcohol use: No   Drug use: No   Sexual activity: Not Currently  Other Topics Concern   Not on file  Social History Narrative   Lives with husband Loraine Leriche and 101 year old son Almeta Monas   Husband is Technical brewer of [employer]   Social Determinants of Health   Financial Resource Strain: Low Risk  (02/26/2018)   Overall Financial Resource Strain (CARDIA)    Difficulty of Paying Living Expenses: Not hard at all  Food Insecurity: No Food Insecurity (02/26/2018)   Hunger Vital Sign    Worried About Running Out of Food in the Last Year: Never true    Ran Out of Food in the Last Year: Never true  Transportation Needs: No Transportation Needs (02/26/2018)   PRAPARE - Administrator, Civil Service (Medical): No    Lack of Transportation (Non-Medical): No  Physical Activity: Inactive (02/26/2018)   Exercise Vital Sign    Days of Exercise per Week: 0 days    Minutes  of Exercise per Session: 0 min  Stress: Stress Concern Present (02/26/2018)   [employer] of Occupational Health - Occupational Stress Questionnaire    Feeling of Stress : To some extent  Social  Connections: Socially Integrated (02/26/2018)   Social Connection and Isolation Panel [NHANES]    Frequency of Communication with Friends and Family: More than three times a week    Frequency of Social Gatherings with Friends and Family: Twice a week    Attends Religious Services: More than 4 times per year    Active Member of [employer] or Organizations: Yes    Attends Engineer, structural: More than 4 times per year    Marital Status: Married  Catering manager Violence: Not At Risk (02/26/2018)   Humiliation, Afraid, Rape, and Kick questionnaire    Fear of Current or Ex-Partner: No    Emotionally Abused: No    Physically Abused: No    Sexually Abused: No    Family History: Family History  Problem Relation Age of Onset   Hypertension Mother    Hypertension Father    Prostate cancer Father    Cancer Maternal Aunt        spine/back   Cancer Paternal Grandfather        lung   Healthy Son    Healthy Son    Healthy Son    Healthy Son    Colon cancer Neg Hx     Current Medications:  Current Outpatient Medications:    ALPRAZolam (XANAX) 0.5 MG tablet, Take 1 tablet (0.5 mg total) by mouth 3 (three) times daily as needed for anxiety., Disp: 90 tablet, Rfl: 2   amitriptyline (ELAVIL) 10 MG tablet, Take 1 tablet (10 mg total) by mouth 2 (two) times daily., Disp: 180 tablet, Rfl: 3   ARMOUR THYROID 60 MG tablet, Take 1 tablet (60 mg total) by mouth daily., Disp: 90 tablet, Rfl: 1   ARMOUR THYROID 60 MG tablet, Take 1 tablet (60 mg total) by mouth daily., Disp: 90 tablet, Rfl: 1   ARMOUR THYROID 60 MG tablet, Take 1 tablet (60 mg total) by mouth daily., Disp: 90 tablet, Rfl: 1   loperamide (IMODIUM) 2 MG capsule, Take 1 capsule (2 mg total) by mouth 3 (three) times daily. (Patient taking differently: Take 2 mg by mouth daily.), Disp: 30 capsule, Rfl: 0   pregabalin (LYRICA) 25 MG capsule, Take 1 capsule (25 mg total) by mouth 2 (two) times daily., Disp: 60 capsule, Rfl: 3    prochlorperazine (COMPAZINE) 10 MG tablet, Take 1 tablet (10 mg total) by mouth every 6 (six) hours as needed for nausea or vomiting., Disp: 30 tablet, Rfl: 4   sucralfate (CARAFATE) 1 g tablet, Take 1 tablet (1 g total) by mouth 4 (four) times daily -  with meals and at bedtime. Crush 1 tablet in 1 oz water and drink 5 min before meals for radiation induced esophagitis, Disp: 120 tablet, Rfl: 2   thyroid (ARMOUR THYROID) 15 MG tablet, Take 1 tablet (15 mg total) by mouth daily as needed with 60 mg armour thyroid tablet (75 mg total)., Disp: 30 tablet, Rfl: 3   tiZANidine (ZANAFLEX) 4 MG tablet, Take 1 tablet (4 mg total) by mouth every 6 (six) hours as needed for muscle spasms (take for tightness and pain in the scapular region)., Disp: 56 tablet, Rfl: 2 No current facility-administered medications for this visit.  Facility-Administered Medications Ordered in Other Visits:    clindamycin (CLEOCIN) 900 mg in dextrose  5 % 50 mL IVPB, 900 mg, Intravenous, 60 min Pre-Op **AND** gentamicin (GARAMYCIN) 350 mg in dextrose 5 % 50 mL IVPB, 5 mg/kg, Intravenous, 60 min Pre-Op, Romie Levee, MD   clindamycin (CLEOCIN) 900 mg in dextrose 5 % 50 mL IVPB, 900 mg, Intravenous, 60 min Pre-Op **AND** gentamicin (GARAMYCIN) 5 mg/kg in dextrose 5 % 50 mL IVPB, 5 mg/kg, Intravenous, 60 min Pre-Op, Romie Levee, MD   fluorouracil (ADRUCIL) 3,500 mg in sodium chloride 0.9 % 80 mL chemo infusion, 1,920 mg/m2 (Treatment Plan Recorded), Intravenous, 1 day or 1 dose, [name], MD, Infusion Verify at 11/23/22 1316   sodium chloride 0.9 % 1,000 mL with potassium chloride 20 mEq, magnesium sulfate 2 g infusion, , Intravenous, Once, [name], MD   Allergies: Allergies  Allergen Reactions   Oxaliplatin Other (See Comments)    Patient had hypersensitivity reaction to Oxaliplatin. Patient c/o itching in her ears and eyes. Pt also c/o being hot, flushed, dizzy with redness on pt's face, arms, and legs. Pt  c/o tingling in her hands and legs. Pt also had a dry throat with coughing. See progress note from 09/07/22 at 1130. Patient was not able to complete Oxaliplatin infusion.   Second reaction 10/26/2022. Itching in ears, heart racing, & face flushing. See progress note from 10/26/2022. Able to complete infusion after medications.   Demerol [Meperidine] Itching    All over the body.   Penicillins Hives     childhood, does not remember if it spread all over the body or not. Did it involve swelling of the face/tongue/throat, SOB, or low BP? No Did it involve sudden or severe rash/hives, skin peeling, or any reaction on the inside of your mouth or nose? Yes Did you need to seek medical attention at a hospital or doctor's office? Yes When did it last happen?      childhood allergy If all above answers are "NO", may proceed with cephalosporin use.    Levaquin [Levofloxacin] Other (See Comments)    Patient has Aortic Aneurysm and is not indicated with this diagnosis   Other Hives and Other (See Comments)    Fresh coconut    Vancomycin Rash    Will need Benadryl prior to administration per IV. "Red man syndrome"    REVIEW OF SYSTEMS:   Review of Systems  Constitutional:  Negative for chills, fatigue and fever.  HENT:   Negative for lump/mass, mouth sores, nosebleeds, sore throat and trouble swallowing.   Eyes:  Negative for eye problems.  Respiratory:  Negative for cough and shortness of breath.   Cardiovascular:  Negative for chest pain, leg swelling and palpitations.  Gastrointestinal:  Negative for abdominal pain, constipation, diarrhea, nausea and vomiting.  Genitourinary:  Negative for bladder incontinence, difficulty urinating, dysuria, frequency, hematuria and nocturia.   Musculoskeletal:  Negative for arthralgias, back pain, flank pain, myalgias and neck pain.  Skin:  Negative for itching and rash.  Neurological:  Negative for dizziness, headaches and numbness.  Hematological:  Does not  bruise/bleed easily.  Psychiatric/Behavioral:  Negative for depression, sleep disturbance and suicidal ideas. The patient is not nervous/anxious.   All other systems reviewed and are negative.    VITALS:   Blood pressure 125/85, pulse (!) 110, temperature 97.9 F (36.6 C), temperature source Oral, resp. rate 16, weight 135 lb 6.4 oz (61.4 kg), last menstrual period 02/16/2015, SpO2 100%.  Wt Readings from Last 3 Encounters:  11/23/22 135 lb 6.4 oz (61.4 kg)  11/22/22 136 lb (61.7  kg)  11/09/22 137 lb 6.4 oz (62.3 kg)    Body mass index is 21.53 kg/m.  Performance status (ECOG): 1 - Symptomatic but completely ambulatory  PHYSICAL EXAM:   Physical Exam Vitals and nursing note reviewed. Exam conducted with a chaperone present.  Constitutional:      Appearance: Normal appearance.  Cardiovascular:     Rate and Rhythm: Normal rate and regular rhythm.     Pulses: Normal pulses.     Heart sounds: Normal heart sounds.  Pulmonary:     Effort: Pulmonary effort is normal.     Breath sounds: Normal breath sounds.  Abdominal:     Palpations: Abdomen is soft. There is no hepatomegaly, splenomegaly or mass.     Tenderness: There is no abdominal tenderness.  Musculoskeletal:     Right lower leg: No edema.     Left lower leg: No edema.  Lymphadenopathy:     Cervical: No cervical adenopathy.     Right cervical: No superficial, deep or posterior cervical adenopathy.    Left cervical: No superficial, deep or posterior cervical adenopathy.     Upper Body:     Right upper body: No supraclavicular or axillary adenopathy.     Left upper body: No supraclavicular or axillary adenopathy.  Neurological:     General: No focal deficit present.     Mental Status: She is alert and oriented to person, place, and time.  Psychiatric:        Mood and Affect: Mood normal.        Behavior: Behavior normal.     LABS:      Latest Ref Rng & Units 11/22/2022    1:43 PM 11/09/2022    8:58 AM 10/26/2022     9:19 AM  CBC  WBC 4.0 - 10.5 K/uL 2.6  3.0  2.0   Hemoglobin 12.0 - 15.0 g/dL 47.8  29.5  62.1   Hematocrit 36.0 - 46.0 % 31.8  32.8  33.4   Platelets 150 - 400 K/uL 69  60  100       Latest Ref Rng & Units 11/22/2022    1:43 PM 11/09/2022    8:58 AM 10/26/2022    9:19 AM  CMP  Glucose 70 - 99 mg/dL 90  89  93   BUN 6 - 20 mg/dL 12  8  12    Creatinine 0.44 - 1.00 mg/dL 3.08  6.57  8.46   Sodium 135 - 145 mmol/L 134  136  135   Potassium 3.5 - 5.1 mmol/L 3.5  3.2  3.3   Chloride 98 - 111 mmol/L 103  106  104   CO2 22 - 32 mmol/L 23  22  22    Calcium 8.9 - 10.3 mg/dL 8.7  8.4  8.4   Total Protein 6.5 - 8.1 g/dL 6.7  6.8  7.1   Total Bilirubin 0.3 - 1.2 mg/dL 3.0  1.4  2.3   Alkaline Phos 38 - 126 U/L 65  77  64   AST 15 - 41 U/L 25  21  18    ALT 0 - 44 U/L 16  14  10       Lab Results  Component Value Date   CEA1 5.6 (H) 11/09/2022   /  CEA  Date Value Ref Range Status  11/09/2022 5.6 (H) 0.0 - 4.7 ng/mL Final    Comment:    (NOTE)  Nonsmokers          <3.9                             Smokers             <5.6 Roche [employer] Electrochemiluminescence Immunoassay (ECLIA) Values obtained with different assay methods or kits cannot be used interchangeably.  Results cannot be interpreted as absolute evidence of the presence or absence of malignant disease. Performed At: [facility] [address] [place], [place] [id number] Jolene Schimke MD QM:[id number]    No results found for: "PSA1" No results found for: "CAN199" No results found for: "CAN125"  No results found for: "TOTALPROTELP", "ALBUMINELP", "A1GS", "A2GS", "BETS", "BETA2SER", "GAMS", "MSPIKE", "SPEI" Lab Results  Component Value Date   TIBC 333 09/07/2022   TIBC 364 05/11/2022   TIBC 385 11/09/2020   FERRITIN 89 09/07/2022   FERRITIN 61 05/11/2022   FERRITIN 54 11/09/2020   IRONPCTSAT 36 (H) 09/07/2022   IRONPCTSAT 26 05/11/2022   IRONPCTSAT 22 11/09/2020   Lab  Results  Component Value Date   LDH 154 01/20/2020     STUDIES:   NM PET Image Restag (PS) Skull Base To Thigh  Result Date: 11/08/2022 CLINICAL DATA:  Subsequent treatment strategy for rectal carcinoma staging. Post rectal resection, chemotherapy and radiation therapy. EXAM: NUCLEAR MEDICINE PET SKULL BASE TO THIGH TECHNIQUE: 7.7 mCi F-18 FDG was injected intravenously. Full-ring PET imaging was performed from the skull base to thigh after the radiotracer. CT data was obtained and used for attenuation correction and anatomic localization. Fasting blood glucose: PET-CT 08/24/2022 mg/dl COMPARISON:  None Available. FINDINGS: Mediastinal blood pool activity: SUV max 1.9 Liver activity: SUV max NA NECK: No hypermetabolic lymph nodes in the neck. Incidental CT findings: None. CHEST: In the medial aspect of the RIGHT middle lobe, there is a new focus of hypermetabolic activity associated with rounded focus of consolidation measuring 13 mm on image 125. Activity is intense metabolic activity (SUV max equal 7.9). Again demonstrated hypermetabolic nodule posterior aspect LEFT upper lobe measuring 7 mm with SUV max equal 1.7 and not changed comparison exam. Nodular pleuroparenchymal thickening in the LEFT lower lobe without significant metabolic activity. Incidental CT findings: None. ABDOMEN/PELVIS: Interval resolution hypermetabolic activity in the LEFT hepatic lobe. Interval resolution hypermetabolic adrenal glands. The RIGHT adrenal gland remains enlarged to 17 mm compared to 20 mm again with no significant metabolic activity Incidental CT findings: None. SKELETON: Interval resolution of the previous seen hypermetabolic metastasis within the RIGHT aspect of the C6 vertebral body. There is uniform increase in marrow activity throughout the skeleton compared to prior. No discrete skeletal lesion identified. Incidental CT findings: None. IMPRESSION: 1. Interval resolution of hypermetabolic activity in the LEFT  hepatic lobe consistent with treatment response. 2. Interval resolution of hypermetabolic adrenal metastasis. 3. 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 4. Interval development of hypermetabolic nodule in the RIGHT middle lobe. With the positive therapy response elsewhere, this finding could represent a focus of inflammation or infection. Metabolic activity is intense and warrants surveillance 5. Stable mildly hypermetabolic nodule in the LEFT upper lobe. 6. Interval development of diffuse increased marrow activity throughout the skeleton. Favor GCSF marrow stimulation. Electronically Signed   By: Genevive Bi M.D.   On: 11/08/2022 10:11

## 2022-11-22 NOTE — Progress Notes (Signed)
Patients port flushed without difficulty.  Good blood return noted with no bruising or swelling noted at site.  Patient remains accessed for treatment tomorrow.

## 2022-11-23 ENCOUNTER — Inpatient Hospital Stay: Payer: 59

## 2022-11-23 ENCOUNTER — Inpatient Hospital Stay (HOSPITAL_BASED_OUTPATIENT_CLINIC_OR_DEPARTMENT_OTHER): Payer: 59 | Admitting: Hematology

## 2022-11-23 VITALS — BP 125/85 | HR 110 | Temp 97.9°F | Resp 16 | Wt 135.4 lb

## 2022-11-23 VITALS — BP 113/75 | HR 84 | Temp 98.4°F | Resp 18

## 2022-11-23 DIAGNOSIS — C20 Malignant neoplasm of rectum: Secondary | ICD-10-CM | POA: Diagnosis not present

## 2022-11-23 DIAGNOSIS — Z5111 Encounter for antineoplastic chemotherapy: Secondary | ICD-10-CM | POA: Diagnosis not present

## 2022-11-23 DIAGNOSIS — Z79631 Long term (current) use of antimetabolite agent: Secondary | ICD-10-CM | POA: Diagnosis not present

## 2022-11-23 DIAGNOSIS — C787 Secondary malignant neoplasm of liver and intrahepatic bile duct: Secondary | ICD-10-CM | POA: Diagnosis not present

## 2022-11-23 DIAGNOSIS — Z79899 Other long term (current) drug therapy: Secondary | ICD-10-CM | POA: Diagnosis not present

## 2022-11-23 DIAGNOSIS — Z5112 Encounter for antineoplastic immunotherapy: Secondary | ICD-10-CM | POA: Diagnosis not present

## 2022-11-23 MED ORDER — SODIUM CHLORIDE 0.9 % IV SOLN
5.0000 mg/kg | Freq: Once | INTRAVENOUS | Status: AC
  Administered 2022-11-23: 300 mg via INTRAVENOUS
  Filled 2022-11-23: qty 12

## 2022-11-23 MED ORDER — SODIUM CHLORIDE 0.9 % IV SOLN
20.0000 mg | Freq: Once | INTRAVENOUS | Status: AC
  Administered 2022-11-23: 20 mg via INTRAVENOUS
  Filled 2022-11-23: qty 20

## 2022-11-23 MED ORDER — FAMOTIDINE IN NACL 20-0.9 MG/50ML-% IV SOLN: 20.0000 mg | Freq: Once | INTRAVENOUS | Status: DC

## 2022-11-23 MED ORDER — FLUOROURACIL CHEMO INJECTION 500 MG/10ML
320.0000 mg/m2 | Freq: Once | INTRAVENOUS | Status: AC
  Administered 2022-11-23: 500 mg via INTRAVENOUS
  Filled 2022-11-23: qty 10

## 2022-11-23 MED ORDER — SODIUM CHLORIDE 0.9 % IV SOLN: Freq: Once | INTRAVENOUS | Status: AC

## 2022-11-23 MED ORDER — PALONOSETRON HCL INJECTION 0.25 MG/5ML
0.2500 mg | Freq: Once | INTRAVENOUS | Status: AC
  Administered 2022-11-23: 0.25 mg via INTRAVENOUS
  Filled 2022-11-23: qty 5

## 2022-11-23 MED ORDER — SODIUM CHLORIDE 0.9 % IV SOLN
1920.0000 mg/m2 | INTRAVENOUS | Status: DC
  Administered 2022-11-23: 3500 mg via INTRAVENOUS
  Filled 2022-11-23: qty 70

## 2022-11-23 MED ORDER — SODIUM CHLORIDE 0.9 % IV SOLN
320.0000 mg/m2 | Freq: Once | INTRAVENOUS | Status: AC
  Administered 2022-11-23: 540 mg via INTRAVENOUS
  Filled 2022-11-23: qty 27

## 2022-11-23 NOTE — Progress Notes (Signed)
Patient presents today for treatment and follow up visit with Dr. Ellin Saba. Labs  reviewed by MD. Heart rate 110 on arrival.   Message received from A. Dareen Piano RN / Dr. Theo Dills will hold Oxaliplatin indefinitely.   Pulse recheck 96. Urine negative for protein on 11/09/2022. Platelets 69. MD aware. Patient will receive MVASI, Leucovorin, Fluorouracil, 5FU pump. Pepcid and Oxaliplatin removed from treatment plan per CYetta Barre RPH.   Treatment given today per MD orders. Tolerated infusion without adverse affects. Vital signs stable. No complaints at this time. Discharged from clinic ambulatory in stable condition. Alert and oriented x 3. F/U with [facility] as scheduled.   Marland Kitchen

## 2022-11-23 NOTE — Patient Instructions (Signed)

## 2022-11-23 NOTE — Progress Notes (Signed)
Patient has been examined by Dr. Ellin Saba. Vital signs and labs have been reviewed by MD - ANC, Creatinine, LFTs (bilirubin 3.0), hemoglobin, and platelets (69,000) are within treatment parameters per M.D. - pt may proceed with treatment. Holding oxaliplatin indefinitely per MD. Primary RN and pharmacy notified.

## 2022-11-23 NOTE — Patient Instructions (Signed)
[facility]  Discharge Instructions: Thank you for choosing [facility] to provide your oncology and hematology care.  If you have a lab appointment with the Cancer Center - please note that after April 8th, 2024, all labs will be drawn in the cancer center.  You do not have to check in or register with the main entrance as you have in the past but will complete your check-in in the cancer center.  Wear comfortable clothing and clothing appropriate for easy access to any Portacath or PICC line.   We strive to give you quality time with your provider. You may need to reschedule your appointment if you arrive late (15 or more minutes).  Arriving late affects you and other patients whose appointments are after yours.  Also, if you miss three or more appointments without notifying the office, you may be dismissed from the clinic at the provider's discretion.      For prescription refill requests, have your pharmacy contact our office and allow 72 hours for refills to be completed.    Today you received the following chemotherapy and/or immunotherapy agents MVASI, Leucorvorin, Adrucil, 5FU pump.Fluorouracil Injection What is this medication? FLUOROURACIL (flure oh YOOR a sil) treats some types of cancer. It works by slowing down the growth of cancer cells. This medicine may be used for other purposes; ask your health care provider or pharmacist if you have questions. COMMON BRAND NAME(S): Adrucil What should I tell my care team before I take this medication? They need to know if you have any of these conditions: Blood disorders Dihydropyrimidine dehydrogenase (DPD) deficiency Infection, such as chickenpox, cold sores, herpes Kidney disease Liver disease Poor nutrition Recent or ongoing radiation therapy An unusual or allergic reaction to fluorouracil, other medications, foods, dyes, or preservatives If you or your partner are pregnant or trying to get  pregnant Breast-feeding How should I use this medication? This medication is injected into a vein. It is administered by your care team in a hospital or clinic setting. Talk to your care team about the use of this medication in children. Special care may be needed. Overdosage: If you think you have taken too much of this medicine contact a poison control center or emergency room at once. NOTE: This medicine is only for you. Do not share this medicine with others. What if I miss a dose? Keep appointments for follow-up doses. It is important not to miss your dose. Call your care team if you are unable to keep an appointment. What may interact with this medication? Do not take this medication with any of the following: Live virus vaccines This medication may also interact with the following: Medications that treat or prevent blood clots, such as warfarin, enoxaparin, dalteparin This list may not describe all possible interactions. Give your health care provider a list of all the medicines, herbs, non-prescription drugs, or dietary supplements you use. Also tell them if you smoke, drink alcohol, or use illegal drugs. Some items may interact with your medicine. What should I watch for while using this medication? Your condition will be monitored carefully while you are receiving this medication. This medication may make you feel generally unwell. This is not uncommon as chemotherapy can affect healthy cells as well as cancer cells. Report any side effects. Continue your course of treatment even though you feel ill unless your care team tells you to stop. In some cases, you may be given additional medications to help with side effects.  Follow all directions for their use. This medication may increase your risk of getting an infection. Call your care team for advice if you get a fever, chills, sore throat, or other symptoms of a cold or flu. Do not treat yourself. Try to avoid being around people who are  sick. This medication may increase your risk to bruise or bleed. Call your care team if you notice any unusual bleeding. Be careful brushing or flossing your teeth or using a toothpick because you may get an infection or bleed more easily. If you have any dental work done, tell your dentist you are receiving this medication. Avoid taking medications that contain aspirin, acetaminophen, ibuprofen, naproxen, or ketoprofen unless instructed by your care team. These medications may hide a fever. Do not treat diarrhea with over the counter products. Contact your care team if you have diarrhea that lasts more than 2 days or if it is severe and watery. This medication can make you more sensitive to the sun. Keep out of the sun. If you cannot avoid being in the sun, wear protective clothing and sunscreen. Do not use sun lamps, tanning beds, or tanning booths. Talk to your care team if you or your partner wish to become pregnant or think you might be pregnant. This medication can cause serious birth defects if taken during pregnancy and for 3 months after the last dose. A reliable form of contraception is recommended while taking this medication and for 3 months after the last dose. Talk to your care team about effective forms of contraception. Do not father a child while taking this medication and for 3 months after the last dose. Use a condom while having sex during this time period. Do not breastfeed while taking this medication. This medication may cause infertility. Talk to your care team if you are concerned about your fertility. What side effects may I notice from receiving this medication? Side effects that you should report to your care team as soon as possible: Allergic reactions--skin rash, itching, hives, swelling of the face, lips, tongue, or throat Heart attack--pain or tightness in the chest, shoulders, arms, or jaw, nausea, shortness of breath, cold or clammy skin, feeling faint or  lightheaded Heart failure--shortness of breath, swelling of the ankles, feet, or hands, sudden weight gain, unusual weakness or fatigue Heart rhythm changes--fast or irregular heartbeat, dizziness, feeling faint or lightheaded, chest pain, trouble breathing High ammonia level--unusual weakness or fatigue, confusion, loss of appetite, nausea, vomiting, seizures Infection--fever, chills, cough, sore throat, wounds that don't heal, pain or trouble when passing urine, general feeling of discomfort or being unwell Low red blood cell level--unusual weakness or fatigue, dizziness, headache, trouble breathing Pain, tingling, or numbness in the hands or feet, muscle weakness, change in vision, confusion or trouble speaking, loss of balance or coordination, trouble walking, seizures Redness, swelling, and blistering of the skin over hands and feet Severe or prolonged diarrhea Unusual bruising or bleeding Side effects that usually do not require medical attention (report to your care team if they continue or are bothersome): Dry skin Headache Increased tears Nausea Pain, redness, or swelling with sores inside the mouth or throat Sensitivity to light Vomiting This list may not describe all possible side effects. Call your doctor for medical advice about side effects. You may report side effects to FDA at 1-800-FDA-1088. Where should I keep my medication? This medication is given in a hospital or clinic. It will not be stored at home. NOTE: This sheet is a summary.  It may not cover all possible information. If you have questions about this medicine, talk to your doctor, pharmacist, or health care provider.  2024 Elsevier/Gold Standard (2021-07-20 00:00:00) Leucovorin Injection What is this medication? LEUCOVORIN (loo koe VOR in) prevents side effects from certain medications, such as methotrexate. It works by increasing folate levels. This helps protect healthy cells in your body. It may also be used to  treat anemia caused by low levels of folate. It can also be used with fluorouracil, a type of chemotherapy, to treat colorectal cancer. It works by increasing the effects of fluorouracil in the body. This medicine may be used for other purposes; ask your health care provider or pharmacist if you have questions. What should I tell my care team before I take this medication? They need to know if you have any of these conditions: Anemia from low levels of vitamin B12 in the blood An unusual or allergic reaction to leucovorin, folic acid, other medications, foods, dyes, or preservatives Pregnant or trying to get pregnant Breastfeeding How should I use this medication? This medication is injected into a vein or a muscle. It is given by your care team in a hospital or clinic setting. Talk to your care team about the use of this medication in children. Special care may be needed. Overdosage: If you think you have taken too much of this medicine contact a poison control center or emergency room at once. NOTE: This medicine is only for you. Do not share this medicine with others. What if I miss a dose? Keep appointments for follow-up doses. It is important not to miss your dose. Call your care team if you are unable to keep an appointment. What may interact with this medication? Capecitabine Fluorouracil Phenobarbital Phenytoin Primidone Trimethoprim;sulfamethoxazole This list may not describe all possible interactions. Give your health care provider a list of all the medicines, herbs, non-prescription drugs, or dietary supplements you use. Also tell them if you smoke, drink alcohol, or use illegal drugs. Some items may interact with your medicine. What should I watch for while using this medication? Your condition will be monitored carefully while you are receiving this medication. This medication may increase the side effects of 5-fluorouracil. Tell your care team if you have diarrhea or mouth  sores that do not get better or that get worse. What side effects may I notice from receiving this medication? Side effects that you should report to your care team as soon as possible: Allergic reactions--skin rash, itching, hives, swelling of the face, lips, tongue, or throat This list may not describe all possible side effects. Call your doctor for medical advice about side effects. You may report side effects to FDA at 1-800-FDA-1088. Where should I keep my medication? This medication is given in a hospital or clinic. It will not be stored at home. NOTE: This sheet is a summary. It may not cover all possible information. If you have questions about this medicine, talk to your doctor, pharmacist, or health care provider.  2024 Elsevier/Gold Standard (2021-08-17 00:00:00) Bevacizumab Injection What is this medication? BEVACIZUMAB (be va SIZ yoo mab) treats some types of cancer. It works by blocking a protein that causes cancer cells to grow and multiply. This helps to slow or stop the spread of cancer cells. It is a monoclonal antibody. This medicine may be used for other purposes; ask your health care provider or pharmacist if you have questions. COMMON BRAND NAME(S): Alymsys, Avastin, MVASI, Omer Jack What should I tell my  care team before I take this medication? They need to know if you have any of these conditions: Blood clots Coughing up blood Having or recent surgery Heart failure High blood pressure History of a connection between 2 or more body parts that do not usually connect (fistula) History of a tear in your stomach or intestines Protein in your urine An unusual or allergic reaction to bevacizumab, other medications, foods, dyes, or preservatives Pregnant or trying to get pregnant Breast-feeding How should I use this medication? This medication is injected into a vein. It is given by your care team in a hospital or clinic setting. Talk to your care team the use of this  medication in children. Special care may be needed. Overdosage: If you think you have taken too much of this medicine contact a poison control center or emergency room at once. NOTE: This medicine is only for you. Do not share this medicine with others. What if I miss a dose? Keep appointments for follow-up doses. It is important not to miss your dose. Call your care team if you are unable to keep an appointment. What may interact with this medication? Interactions are not expected. This list may not describe all possible interactions. Give your health care provider a list of all the medicines, herbs, non-prescription drugs, or dietary supplements you use. Also tell them if you smoke, drink alcohol, or use illegal drugs. Some items may interact with your medicine. What should I watch for while using this medication? Your condition will be monitored carefully while you are receiving this medication. You may need blood work while taking this medication. This medication may make you feel generally unwell. This is not uncommon as chemotherapy can affect healthy cells as well as cancer cells. Report any side effects. Continue your course of treatment even though you feel ill unless your care team tells you to stop. This medication may increase your risk to bruise or bleed. Call your care team if you notice any unusual bleeding. Before having surgery, talk to your care team to make sure it is ok. This medication can increase the risk of poor healing of your surgical site or wound. You will need to stop this medication for 28 days before surgery. After surgery, wait at least 28 days before restarting this medication. Make sure the surgical site or wound is healed enough before restarting this medication. Talk to your care team if questions. Talk to your care team if you may be pregnant. Serious birth defects can occur if you take this medication during pregnancy and for 6 months after the last dose.  Contraception is recommended while taking this medication and for 6 months after the last dose. Your care team can help you find the option that works for you. Do not breastfeed while taking this medication and for 6 months after the last dose. This medication can cause infertility. Talk to your care team if you are concerned about your fertility. What side effects may I notice from receiving this medication? Side effects that you should report to your care team as soon as possible: Allergic reactions--skin rash, itching, hives, swelling of the face, lips, tongue, or throat Bleeding--bloody or black, tar-like stools, vomiting blood or brown material that looks like coffee grounds, red or dark brown urine, small red or purple spots on skin, unusual bruising or bleeding Blood clot--pain, swelling, or warmth in the leg, shortness of breath, chest pain Heart attack--pain or tightness in the chest, shoulders, arms, or jaw, nausea,  shortness of breath, cold or clammy skin, feeling faint or lightheaded Heart failure--shortness of breath, swelling of the ankles, feet, or hands, sudden weight gain, unusual weakness or fatigue Increase in blood pressure Infection--fever, chills, cough, sore throat, wounds that don't heal, pain or trouble when passing urine, general feeling of discomfort or being unwell Infusion reactions--chest pain, shortness of breath or trouble breathing, feeling faint or lightheaded Kidney injury--decrease in the amount of urine, swelling of the ankles, hands, or feet Stomach pain that is severe, does not go away, or gets worse Stroke--sudden numbness or weakness of the face, arm, or leg, trouble speaking, confusion, trouble walking, loss of balance or coordination, dizziness, severe headache, change in vision Sudden and severe headache, confusion, change in vision, seizures, which may be signs of posterior reversible encephalopathy syndrome (PRES) Side effects that usually do not require  medical attention (report to your care team if they continue or are bothersome): Back pain Change in taste Diarrhea Dry skin Increased tears Nosebleed This list may not describe all possible side effects. Call your doctor for medical advice about side effects. You may report side effects to FDA at 1-800-FDA-1088. Where should I keep my medication? This medication is given in a hospital or clinic. It will not be stored at home. NOTE: This sheet is a summary. It may not cover all possible information. If you have questions about this medicine, talk to your doctor, pharmacist, or health care provider.  2024 Elsevier/Gold Standard (2021-07-30 00:00:00)       To help prevent nausea and vomiting after your treatment, we encourage you to take your nausea medication as directed.  BELOW ARE SYMPTOMS THAT SHOULD BE REPORTED IMMEDIATELY: *FEVER GREATER THAN 100.4 F (38 C) OR HIGHER *CHILLS OR SWEATING *NAUSEA AND VOMITING THAT IS NOT CONTROLLED WITH YOUR NAUSEA MEDICATION *UNUSUAL SHORTNESS OF BREATH *UNUSUAL BRUISING OR BLEEDING *URINARY PROBLEMS (pain or burning when urinating, or frequent urination) *BOWEL PROBLEMS (unusual diarrhea, constipation, pain near the anus) TENDERNESS IN MOUTH AND THROAT WITH OR WITHOUT PRESENCE OF ULCERS (sore throat, sores in mouth, or a toothache) UNUSUAL RASH, SWELLING OR PAIN  UNUSUAL VAGINAL DISCHARGE OR ITCHING   Items with * indicate a potential emergency and should be followed up as soon as possible or go to the Emergency Department if any problems should occur.  Please show the CHEMOTHERAPY ALERT CARD or IMMUNOTHERAPY ALERT CARD at check-in to the Emergency Department and triage nurse.  Should you have questions after your visit or need to cancel or reschedule your appointment, please contact [facility] [phone]  and follow the prompts.  Office hours are 8:00 a.m. to 4:30 p.m. Monday - Friday. Please note that voicemails left  after 4:00 p.m. may not be returned until the following business day.  We are closed weekends and major holidays. You have access to a nurse at all times for urgent questions. Please call the main number to the clinic [phone] and follow the prompts.  For any non-urgent questions, you may also contact your provider using MyChart. We now offer e-Visits for anyone 67 and older to request care online for non-urgent symptoms. For details visit [email or web address].   Also download the MyChart app! Go to the app store, search "MyChart", open the app, select [place], and log in with your MyChart username and password.

## 2022-11-25 ENCOUNTER — Inpatient Hospital Stay: Payer: 59

## 2022-11-25 ENCOUNTER — Other Ambulatory Visit: Payer: Self-pay

## 2022-11-25 VITALS — BP 90/70 | HR 87 | Temp 98.0°F | Resp 18

## 2022-11-25 DIAGNOSIS — Z79899 Other long term (current) drug therapy: Secondary | ICD-10-CM | POA: Diagnosis not present

## 2022-11-25 DIAGNOSIS — Z5111 Encounter for antineoplastic chemotherapy: Secondary | ICD-10-CM | POA: Diagnosis not present

## 2022-11-25 DIAGNOSIS — C20 Malignant neoplasm of rectum: Secondary | ICD-10-CM

## 2022-11-25 DIAGNOSIS — Z5112 Encounter for antineoplastic immunotherapy: Secondary | ICD-10-CM | POA: Diagnosis not present

## 2022-11-25 DIAGNOSIS — Z79631 Long term (current) use of antimetabolite agent: Secondary | ICD-10-CM | POA: Diagnosis not present

## 2022-11-25 MED ORDER — PEGFILGRASTIM-JMDB 6 MG/0.6ML ~~LOC~~ SOSY
6.0000 mg | PREFILLED_SYRINGE | Freq: Once | SUBCUTANEOUS | Status: DC
  Filled 2022-11-25: qty 0.6

## 2022-11-25 MED ORDER — HEPARIN SOD (PORK) LOCK FLUSH 100 UNIT/ML IV SOLN
500.0000 [IU] | Freq: Once | INTRAVENOUS | Status: AC | PRN
  Administered 2022-11-25: 500 [IU]

## 2022-11-25 MED ORDER — SODIUM CHLORIDE 0.9% FLUSH
10.0000 mL | INTRAVENOUS | Status: DC | PRN
  Administered 2022-11-25: 10 mL

## 2022-11-25 NOTE — Patient Instructions (Signed)
[facility]  Discharge Instructions: Thank you for choosing [facility] to provide your oncology and hematology care.  If you have a lab appointment with the Cancer Center - please note that after April 8th, 2024, all labs will be drawn in the cancer center.  You do not have to check in or register with the main entrance as you have in the past but will complete your check-in in the cancer center.  Wear comfortable clothing and clothing appropriate for easy access to any Portacath or PICC line.   We strive to give you quality time with your provider. You may need to reschedule your appointment if you arrive late (15 or more minutes).  Arriving late affects you and other patients whose appointments are after yours.  Also, if you miss three or more appointments without notifying the office, you may be dismissed from the clinic at the provider's discretion.      For prescription refill requests, have your pharmacy contact our office and allow 72 hours for refills to be completed.    Today you received 5FU chemotherapy disconnection     BELOW ARE SYMPTOMS THAT SHOULD BE REPORTED IMMEDIATELY: *FEVER GREATER THAN 100.4 F (38 C) OR HIGHER *CHILLS OR SWEATING *NAUSEA AND VOMITING THAT IS NOT CONTROLLED WITH YOUR NAUSEA MEDICATION *UNUSUAL SHORTNESS OF BREATH *UNUSUAL BRUISING OR BLEEDING *URINARY PROBLEMS (pain or burning when urinating, or frequent urination) *BOWEL PROBLEMS (unusual diarrhea, constipation, pain near the anus) TENDERNESS IN MOUTH AND THROAT WITH OR WITHOUT PRESENCE OF ULCERS (sore throat, sores in mouth, or a toothache) UNUSUAL RASH, SWELLING OR PAIN  UNUSUAL VAGINAL DISCHARGE OR ITCHING   Items with * indicate a potential emergency and should be followed up as soon as possible or go to the Emergency Department if any problems should occur.  Please show the CHEMOTHERAPY ALERT CARD or IMMUNOTHERAPY ALERT CARD at check-in to the Emergency  Department and triage nurse.  Should you have questions after your visit or need to cancel or reschedule your appointment, please contact [facility] [phone]  and follow the prompts.  Office hours are 8:00 a.m. to 4:30 p.m. Monday - Friday. Please note that voicemails left after 4:00 p.m. may not be returned until the following business day.  We are closed weekends and major holidays. You have access to a nurse at all times for urgent questions. Please call the main number to the clinic [phone] and follow the prompts.  For any non-urgent questions, you may also contact your provider using MyChart. We now offer e-Visits for anyone 24 and older to request care online for non-urgent symptoms. For details visit [email or web address].   Also download the MyChart app! Go to the app store, search "MyChart", open the app, select [place], and log in with your MyChart username and password.

## 2022-11-25 NOTE — Progress Notes (Signed)
Patient presents today for 5FU chemotherapy pump disconnection per provider's order. Vital signs stable and pt voiced no new complaints at this time. Port flushed easily without difficulty with good blood return. Needle removed intact and no bruising or swelling noted at the site.  Discharged from clinic ambulatory in stable condition. Alert and oriented x 3. F/U with [facility] as scheduled.

## 2022-11-30 ENCOUNTER — Ambulatory Visit (HOSPITAL_COMMUNITY)
Admission: RE | Admit: 2022-11-30 | Discharge: 2022-11-30 | Disposition: A | Discharge status: Home / Self Care | Payer: 59 | Source: Ambulatory Visit | Attending: Radiation Oncology | Admitting: Radiation Oncology

## 2022-11-30 DIAGNOSIS — I6782 Cerebral ischemia: Secondary | ICD-10-CM | POA: Diagnosis not present

## 2022-11-30 DIAGNOSIS — C801 Malignant (primary) neoplasm, unspecified: Secondary | ICD-10-CM | POA: Diagnosis not present

## 2022-11-30 DIAGNOSIS — R9082 White matter disease, unspecified: Secondary | ICD-10-CM | POA: Diagnosis not present

## 2022-11-30 DIAGNOSIS — C7931 Secondary malignant neoplasm of brain: Secondary | ICD-10-CM | POA: Insufficient documentation

## 2022-11-30 MED ORDER — GADOBUTROL 1 MMOL/ML IV SOLN
7.5000 mL | Freq: Once | INTRAVENOUS | Status: AC | PRN
  Administered 2022-11-30: 7.5 mL via INTRAVENOUS

## 2022-12-01 ENCOUNTER — Other Ambulatory Visit: Payer: Self-pay

## 2022-12-05 ENCOUNTER — Ambulatory Visit: Payer: Self-pay | Admitting: Radiation Oncology

## 2022-12-05 ENCOUNTER — Encounter: Payer: Self-pay | Admitting: Radiation Oncology

## 2022-12-05 ENCOUNTER — Ambulatory Visit
Admission: RE | Admit: 2022-12-05 | Discharge: 2022-12-05 | Disposition: A | Discharge status: Home / Self Care | Payer: 59 | Source: Ambulatory Visit | Attending: Radiation Oncology | Admitting: Radiation Oncology

## 2022-12-05 ENCOUNTER — Inpatient Hospital Stay: Payer: 59 | Attending: Hematology

## 2022-12-05 DIAGNOSIS — C7951 Secondary malignant neoplasm of bone: Secondary | ICD-10-CM

## 2022-12-05 DIAGNOSIS — Z8042 Family history of malignant neoplasm of prostate: Secondary | ICD-10-CM | POA: Insufficient documentation

## 2022-12-05 DIAGNOSIS — Z79899 Other long term (current) drug therapy: Secondary | ICD-10-CM | POA: Insufficient documentation

## 2022-12-05 DIAGNOSIS — Z5112 Encounter for antineoplastic immunotherapy: Secondary | ICD-10-CM | POA: Insufficient documentation

## 2022-12-05 DIAGNOSIS — Z8249 Family history of ischemic heart disease and other diseases of the circulatory system: Secondary | ICD-10-CM | POA: Insufficient documentation

## 2022-12-05 DIAGNOSIS — Z87891 Personal history of nicotine dependence: Secondary | ICD-10-CM | POA: Insufficient documentation

## 2022-12-05 DIAGNOSIS — C7931 Secondary malignant neoplasm of brain: Secondary | ICD-10-CM | POA: Insufficient documentation

## 2022-12-05 DIAGNOSIS — Z880 Allergy status to penicillin: Secondary | ICD-10-CM | POA: Insufficient documentation

## 2022-12-05 DIAGNOSIS — Z9049 Acquired absence of other specified parts of digestive tract: Secondary | ICD-10-CM | POA: Insufficient documentation

## 2022-12-05 DIAGNOSIS — C787 Secondary malignant neoplasm of liver and intrahepatic bile duct: Secondary | ICD-10-CM | POA: Insufficient documentation

## 2022-12-05 DIAGNOSIS — F419 Anxiety disorder, unspecified: Secondary | ICD-10-CM | POA: Insufficient documentation

## 2022-12-05 DIAGNOSIS — C20 Malignant neoplasm of rectum: Secondary | ICD-10-CM

## 2022-12-05 DIAGNOSIS — Z85850 Personal history of malignant neoplasm of thyroid: Secondary | ICD-10-CM | POA: Insufficient documentation

## 2022-12-05 DIAGNOSIS — Z885 Allergy status to narcotic agent status: Secondary | ICD-10-CM | POA: Insufficient documentation

## 2022-12-05 DIAGNOSIS — D72819 Decreased white blood cell count, unspecified: Secondary | ICD-10-CM | POA: Insufficient documentation

## 2022-12-05 DIAGNOSIS — Z79890 Hormone replacement therapy (postmenopausal): Secondary | ICD-10-CM | POA: Insufficient documentation

## 2022-12-05 DIAGNOSIS — Z801 Family history of malignant neoplasm of trachea, bronchus and lung: Secondary | ICD-10-CM | POA: Insufficient documentation

## 2022-12-05 DIAGNOSIS — K219 Gastro-esophageal reflux disease without esophagitis: Secondary | ICD-10-CM | POA: Insufficient documentation

## 2022-12-05 DIAGNOSIS — K589 Irritable bowel syndrome without diarrhea: Secondary | ICD-10-CM | POA: Insufficient documentation

## 2022-12-05 DIAGNOSIS — D696 Thrombocytopenia, unspecified: Secondary | ICD-10-CM | POA: Insufficient documentation

## 2022-12-05 DIAGNOSIS — Z5111 Encounter for antineoplastic chemotherapy: Secondary | ICD-10-CM | POA: Insufficient documentation

## 2022-12-05 DIAGNOSIS — Z881 Allergy status to other antibiotic agents status: Secondary | ICD-10-CM | POA: Insufficient documentation

## 2022-12-05 DIAGNOSIS — E785 Hyperlipidemia, unspecified: Secondary | ICD-10-CM | POA: Insufficient documentation

## 2022-12-05 NOTE — Progress Notes (Signed)
Telephone nursing appointment for patient to review most recent scan results. I verified patient's identity x2 and began nursing interview.   Patient reports doing well. No issues conveyed at this time.   Meaningful use complete.   Patient aware of their 3:30pm-12/05/2022 telephone appointment w/ Laurence Aly PA-C. I left my extension [phone] in case patient needs anything. Patient verbalized understanding. This concludes the nursing interview.   Patient contact [phone]     Ruel Favors, LPN

## 2022-12-05 NOTE — Progress Notes (Signed)
Radiation Oncology         [phone] ________________________________  Outpatient Follow Up- Conducted via telephone at patient request.  I spoke with the patient to conduct this  visit via telephone. The patient was notified in advance and was offered an in person or telemedicine meeting to allow for face to face communication but instead preferred to proceed with a telephone visit.     Name: Samantha Reynolds MRN: [id number]  Date of Service: 12/05/2022  DOB: 05-31-1965   Diagnosis:  Progressive Metastatic Adenocarcinoma of the rectum.  Interval Since Last Radiation:       08/31/22  SRS Treatment [id number] was treated to 20 Gy in 1 fraction is SRS technique  07/14/22-08/03/22: Site: Cervical Spine Technique: 3D Mode: Photon Dose Per Fraction: 2.5 Gy Prescribed Dose (Delivered / Prescribed): 37.5 Gy / 37.5 Gy Prescribed Fxs (Delivered / Prescribed): 15 / 15  09/21/2021 through 10/05/2021 Ultrahypofractionated  Treatment Site Technique Total Dose (Gy) Dose per Fx (Gy) Completed Fx Beam Energies  Lung, Left: Multifocal Left lung and Subcarinal node IMRT 50/50 5 10/10 6XFFF    08/06/20-08/13/20:  SBRT Treatment The tumor in the RUL was treated with a course of stereotactic body radiation treatment. The patient received 54 Gy In 3 fractions at 18 G per fraction.  12/04/2019-12/13/2019: SBRT Treatment The patient was treated to the liver with a course of stereotactic body radiation treatment.  The patient received 60 Gray in 5 fractions using a SBRT/IMRT technique, with 3 fields.  11/15/19 - 11/22/19:  SBRT Treatment The patient was treated to the left lower lobe with a course of stereotactic body radiation treatment.  The patient received 54 Gray in 3 fractions using a SBRT/IMRT technique, with 3 fields.  07/30/2018-09/06/2018: The rectum was treated to 45 Gy in 25 fractions, and 5.4 Gy boost in 3 fractions  Narrative:  The patient is a delightful 57 y.o. 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 She received total neoadjuvant chemotherapy and completed this in April 2020 followed by chemoRT in the spring of 2020. She subsequently underwent robotic LAR with loop ileostomy in September 2020 with 2 cm of residual disease but negative nodes and margins. She had reversal of the ileostomy in January 2021, also microwave ablation to a right hepatic lesion by Dr. Selena Batten at [facility] in May 2021. She had another lesion that was difficult to access for ablation. In the interim of additional imaging, a lesion in the left lung was noted and treated with SBRT in August 2021. She also was able to undergo SBRT to the liver lesion in September 2021.  She developed additional lung disease and was treated  to a RUL target in the lung in May of 2022 with ultrahypofractionated course with multifocal areas in the left lung and subcarinal station in July 2023.  She was found to have progression of disease involving the liver and right adrenal gland by diagnostic imaging.  A liver biopsy on 06/09/2022 confirmed active metastatic malignancy consistent with her rectal primary.  An MRI of the cervical spine on 07/01/2022 was also performed and confirmed a metastatic lesion in the C6 vertebral body extending to the right posterior elements with extraosseous tumor along with posterior endplate extending into the right C5-C6 and C6-C7 neural foramen.  She went on to receive palliative radiation to her cervical spine and during the course of her therapy complained of numbness in her face which prompted an MRI of the brain on  08/18/2022 which showed a 4 mm enhancing area of focus in the left frontal calvarium as well as a 3 mm nodular enhancing lesion in the anterior right frontal lobe of the brain.  She went on to receive stereotactic radiosurgery which she completed on 08/31/2022.  She has continued with systemic FOLFOXIRI with Avastin and has had some dose modifications, and is  currently to receive FOLFOX with Avastin every 2 weeks after meeting with Dr. Ellin Saba.  Her most recent MRI of the brain for surveillance on 11/30/2022 showed the previously seen enhancing lesion in the right frontal lobe having resolved without any new lesions, the lesion in the left frontal calvarium remains stable.  She is contacted today over the phone to review these results.  On review of systems, the patient reports that she is feeling quite well.  She states that she wanted to avoid more GI complaints with oral 5-FU so she is pleased to move forward with 5-FU in the IV setting.  She denies any concerns neurologically such as headache difficulty with speech or auditory changes or difficulty with movement.  She states that she is really excited that she and her family will be taking a cruise later this year in December.  No other complaints are verbalized.  PAST MEDICAL HISTORY:  Past Medical History:  Diagnosis Date   Anxiety    Cancer (HCC) 2013   thyroid   Endometrial polyp    Endometrial thickening on ultra sound    GERD (gastroesophageal reflux disease)    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   Worse in her 45s when she was ill   H/O Hashimoto thyroiditis    History of chemotherapy    History of radiation therapy    History of thyroid cancer no recurrence   2013--  s/p  left lobe thyroidectomy--  follicular varient papillary / lymphocyctic thyroiditis   Hyperlipidemia 11/09/2015   Hypothyroidism    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HCC) 02/26/2018    PAST SURGICAL HISTORY: Past Surgical History:  Procedure Laterality Date   BIOPSY  02/19/2018   Procedure: BIOPSY;  Surgeon: Malissa Hippo, MD;  [facility];  Service: Endoscopy;;  rectum   COLONOSCOPY N/A 08/20/2015   Procedure: COLONOSCOPY;  Surgeon: Malissa Hippo, MD;  [facility];  Service: Endoscopy;  Laterality: N/A;  730   COLONOSCOPY WITH PROPOFOL N/A 07/21/2021   Procedure: COLONOSCOPY WITH PROPOFOL;   Surgeon: Malissa Hippo, MD;  [facility];  Service: Endoscopy;  Laterality: N/A;  10:10   D & C HYSTEROOSCOPY W/ THERMACHOICE ENDOMETRIAL ABLATION  08-13-2004   DIVERTING ILEOSTOMY N/A 12/07/2018   Procedure: DIVERTING LOOP ILEOSTOMY;  Surgeon: Romie Levee, MD;  Location: [facility];  Service: General;  Laterality: N/A;   FLEXIBLE SIGMOIDOSCOPY N/A 02/19/2018   Procedure: FLEXIBLE SIGMOIDOSCOPY;  Surgeon: Malissa Hippo, MD;  [facility];  Service: Endoscopy;  Laterality: N/A;   HYSTEROSCOPY WITH D & C N/A 04/30/2015   Procedure: DILATATION AND CURETTAGE / INTENDED HYSTEROSCOPY;  Surgeon: Marcelle Overlie, MD;  Location: [facility];  Service: Gynecology;  Laterality: N/A;   ILEOSTOMY CLOSURE N/A 04/17/2019   Procedure: LOOP ILEOSTOMY REVERSAL;  Surgeon: Romie Levee, MD;  [facility]: [facility];  Service: General;  Laterality: N/A;   IR US GUIDE BX ASP/DRAIN  03/09/2018   LAPAROSCOPIC CHOLECYSTECTOMY  04/1996   LAPAROSCOPY N/A 12/11/2018   Procedure: LAPAROSCOPY  ILEOSTOMY REVISION AND ABDOMINAL WASHOUT;  Surgeon: Romie Levee, MD;  Location: [facility];  Service: General;  Laterality: N/A;   Liver Microwave   10/17/2018   POLYPECTOMY  08/20/2015   Procedure: POLYPECTOMY;  Surgeon: Malissa Hippo, MD;  [facility];  Service: Endoscopy;;  Splenic flexure polypectomy   POLYPECTOMY  02/19/2018   Procedure: POLYPECTOMY;  Surgeon: Malissa Hippo, MD;  [facility];  Service: Endoscopy;;  rectum   PORTACATH PLACEMENT N/A 03/14/2018   Procedure: INSERTION PORT-A-CATH;  Surgeon: Franky Macho, MD;  Location: [facility];  Service: General;  Laterality: N/A;   REDUCTION INCARCERATED UTERUS  06-20-2000   intrauterine preg. 13 wks /  urinary retention   THYROID LOBECTOMY  11/24/2011   Procedure: THYROID LOBECTOMY;  Surgeon: Velora Heckler, MD;  Location: [facility];  Service: General;  Laterality: Left;  Left Thyroid Lobectomy   TUBAL LIGATION  2002   XI  ROBOTIC ASSISTED LOWER ANTERIOR RESECTION N/A 12/07/2018   Procedure: XI ROBOTIC ASSISTED LOWER ANTERIOR RESECTION, SPENIC FLEXURE IMMOBILIZATION, COLOANAL ANASTOMOSIS, RIGID PROCTOSCOPY;  Surgeon: Romie Levee, MD;  Location: [facility];  Service: General;  Laterality: N/A;    PAST SOCIAL HISTORY:  Social History   Socioeconomic History   Marital status: Married    Spouse name: Not on file   Number of children: 4   Years of education: Not on file   Highest education level: Not on file  Occupational History    Comment: Systems developer at [employer]  Tobacco Use   Smoking status: Former    Current packs/day: 0.00    Average packs/day: 0.3 packs/day for 10.0 years (2.5 ttl pk-yrs)    Types: Cigarettes    Start date: 10/30/1981    Quit date: 10/31/1991    Years since quitting: 31.1   Smokeless tobacco: Never  Vaping Use   Vaping status: Never Used  Substance and Sexual Activity   Alcohol use: No   Drug use: No   Sexual activity: Not Currently  Other Topics Concern   Not on file  Social History Narrative   Lives with husband Loraine Leriche and 24 year old son Almeta Monas   Husband is Technical brewer of [employer]   Social Determinants of Health   Financial Resource Strain: Low Risk  (02/26/2018)   Overall Financial Resource Strain (CARDIA)    Difficulty of Paying Living Expenses: Not hard at all  Food Insecurity: No Food Insecurity (12/05/2022)   Hunger Vital Sign    Worried About Running Out of Food in the Last Year: Never true    Ran Out of Food in the Last Year: Never true  Transportation Needs: No Transportation Needs (12/05/2022)   PRAPARE - Administrator, Civil Service (Medical): No    Lack of Transportation (Non-Medical): No  Physical Activity: Inactive (02/26/2018)   Exercise Vital Sign    Days of Exercise per Week: 0 days    Minutes of Exercise per Session: 0 min  Stress: Stress Concern Present (02/26/2018)   [employer] of Occupational Health - Occupational Stress  Questionnaire    Feeling of Stress : To some extent  Social Connections: Socially Integrated (02/26/2018)   Social Connection and Isolation Panel [NHANES]    Frequency of Communication with Friends and Family: More than three times a week    Frequency of Social Gatherings with Friends and Family: Twice a week    Attends Religious Services: More than 4 times per year    Active Member of [employer] or Organizations: Yes    Attends Club or  Organization Meetings: More than 4 times per year    Marital Status: Married  Catering manager Violence: Not At Risk (12/05/2022)   Humiliation, Afraid, Rape, and Kick questionnaire    Fear of Current or Ex-Partner: No    Emotionally Abused: No    Physically Abused: No    Sexually Abused: No    PAST FAMILY HISTORY: Family History  Problem Relation Age of Onset   Hypertension Mother    Hypertension Father    Prostate cancer Father    Cancer Maternal Aunt        spine/back   Cancer Paternal Grandfather        lung   Healthy Son    Healthy Son    Healthy Son    Healthy Son    Colon cancer Neg Hx     MEDICATIONS  Current Outpatient Medications  Medication Sig Dispense Refill   ALPRAZolam (XANAX) 0.5 MG tablet Take 1 tablet (0.5 mg total) by mouth 3 (three) times daily as needed for anxiety. 90 tablet 2   amitriptyline (ELAVIL) 10 MG tablet Take 1 tablet (10 mg total) by mouth 2 (two) times daily. 180 tablet 3   ARMOUR THYROID 60 MG tablet Take 1 tablet (60 mg total) by mouth daily. 90 tablet 1   ARMOUR THYROID 60 MG tablet Take 1 tablet (60 mg total) by mouth daily. 90 tablet 1   ARMOUR THYROID 60 MG tablet Take 1 tablet (60 mg total) by mouth daily. 90 tablet 1   loperamide (IMODIUM) 2 MG capsule Take 1 capsule (2 mg total) by mouth 3 (three) times daily. (Patient taking differently: Take 2 mg by mouth daily.) 30 capsule 0   pregabalin (LYRICA) 25 MG capsule Take 1 capsule (25 mg total) by mouth 2 (two) times daily. 60 capsule 3   prochlorperazine  (COMPAZINE) 10 MG tablet Take 1 tablet (10 mg total) by mouth every 6 (six) hours as needed for nausea or vomiting. 30 tablet 4   sucralfate (CARAFATE) 1 g tablet Take 1 tablet (1 g total) by mouth 4 (four) times daily -  with meals and at bedtime. Crush 1 tablet in 1 oz water and drink 5 min before meals for radiation induced esophagitis 120 tablet 2   thyroid (ARMOUR THYROID) 15 MG tablet Take 1 tablet (15 mg total) by mouth daily as needed with 60 mg armour thyroid tablet (75 mg total). 30 tablet 3   tiZANidine (ZANAFLEX) 4 MG tablet Take 1 tablet (4 mg total) by mouth every 6 (six) hours as needed for muscle spasms (take for tightness and pain in the scapular region). 56 tablet 2   No current facility-administered medications for this encounter.   Facility-Administered Medications Ordered in Other Encounters  Medication Dose Route Frequency Provider Last Rate Last Admin   clindamycin (CLEOCIN) 900 mg in dextrose 5 % 50 mL IVPB  900 mg Intravenous 60 min Pre-Op Romie Levee, MD       And   gentamicin (GARAMYCIN) 350 mg in dextrose 5 % 50 mL IVPB  5 mg/kg Intravenous 60 min Pre-Op Romie Levee, MD       clindamycin (CLEOCIN) 900 mg in dextrose 5 % 50 mL IVPB  900 mg Intravenous 60 min Pre-Op Romie Levee, MD       And   gentamicin (GARAMYCIN) 5 mg/kg in dextrose 5 % 50 mL IVPB  5 mg/kg Intravenous 60 min Pre-Op Romie Levee, MD       sodium chloride 0.9 %  1,000 mL with potassium chloride 20 mEq, magnesium sulfate 2 g infusion   Intravenous Once [name], MD        ALLERGIES:  Allergies  Allergen Reactions   Oxaliplatin Other (See Comments)    Patient had hypersensitivity reaction to Oxaliplatin. Patient c/o itching in her ears and eyes. Pt also c/o being hot, flushed, dizzy with redness on pt's face, arms, and legs. Pt c/o tingling in her hands and legs. Pt also had a dry throat with coughing. See progress note from 09/07/22 at 1130. Patient was not able to complete  Oxaliplatin infusion.   Second reaction 10/26/2022. Itching in ears, heart racing, & face flushing. See progress note from 10/26/2022. Able to complete infusion after medications.   Demerol [Meperidine] Itching    All over the body.   Penicillins Hives     childhood, does not remember if it spread all over the body or not. Did it involve swelling of the face/tongue/throat, SOB, or low BP? No Did it involve sudden or severe rash/hives, skin peeling, or any reaction on the inside of your mouth or nose? Yes Did you need to seek medical attention at a hospital or doctor's office? Yes When did it last happen?      childhood allergy If all above answers are "NO", may proceed with cephalosporin use.    Levaquin [Levofloxacin] Other (See Comments)    Patient has Aortic Aneurysm and is not indicated with this diagnosis   Other Hives and Other (See Comments)    Fresh coconut    Vancomycin Rash    Will need Benadryl prior to administration per IV. "Red man syndrome"   Physical Exam: Wt Readings from Last 3 Encounters:  11/23/22 135 lb 6.4 oz (61.4 kg)  11/22/22 136 lb (61.7 kg)  11/09/22 137 lb 6.4 oz (62.3 kg)   Temp Readings from Last 3 Encounters:  11/25/22 98 F (36.7 C) (Tympanic)  11/23/22 98.4 F (36.9 C) (Tympanic)  11/23/22 97.9 F (36.6 C) (Oral)   BP Readings from Last 3 Encounters:  11/25/22 90/70  11/23/22 113/75  11/23/22 125/85   Pulse Readings from Last 3 Encounters:  11/25/22 87  11/23/22 84  11/23/22 (!) 110   Unable to assess due to encounter type   Impression/Plan: 1. Progressive Metastatic Stage IV, [id number], adenocarcinoma of the rectum.  Patient has been doing very well clinically.  Radiographically her most recent MRI is quite reassuring, we discussed the routine surveillance of the brain oncology program and recommend another scan in 3 to 4 months.  She is in agreement with this and would like to wait until after her upcoming vacation.  She will continue  systemic therapy as well with Dr. Ellin Saba.      This encounter was conducted via telephone.  The patient has provided two factor identification and has given verbal consent for this type of encounter and has been advised to only accept a meeting of this type in a secure network environment. The time spent during this encounter was 35 minutes including preparation, discussion, and coordination of the patient's care. The attendants for this meeting include Ronny Bacon  and Nobie Putnam   During the encounter,  Ronny Bacon was located remotely SKYY SCHRAEDER was located at work     Osker Mason, [facility]

## 2022-12-06 DIAGNOSIS — C787 Secondary malignant neoplasm of liver and intrahepatic bile duct: Secondary | ICD-10-CM | POA: Diagnosis not present

## 2022-12-06 DIAGNOSIS — C20 Malignant neoplasm of rectum: Secondary | ICD-10-CM | POA: Diagnosis not present

## 2022-12-06 NOTE — Progress Notes (Signed)
[facility] [address] [place] [address]    Clinic Day:  12/07/2022  Referring physician: Babs Sciara, MD  Patient Care Team: Babs Sciara, MD as PCP - General (Family Medicine) Samantha Massed, MD as Medical Oncologist (Medical Oncology)   ASSESSMENT & PLAN:   Assessment: 1.  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  Liver biopsy on 03/09/2018 consistent with adenocarcinoma. 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 -7 cycles of FOLFOX with vectibix completed on 06/27/2018. -XRT with Xeloda from 07/30/2018 through 09/06/2018. -Right liver lesion microwave ablation on 10/15/2018 at [facility]. -Low anterior resection and diverting ileostomy on 12/07/2018, pathology [id number], 0/6 lymph nodes positive, margins negative. -Loop ileostomy reversal on 04/17/2019. - Microwave ablation of the right hepatic lobe lesion by Dr. Selena Batten around 08/15/2019. -SBRT to the left lower lobe lung lesion and left liver lobe lesion on 12/13/2019 at [facility]. - Right upper lobe lesion SBRT from 08/06/2020 through 08/13/2020. - 3 left lung lesions and subcarinal lymph node IMRT from 09/21/2021 through 10/05/2021. - Liver biopsy (06/09/2022): Metastatic moderately differentiated colonic adenocarcinoma. - NGS: KRAS/NRAS/BRAF wild-type, MSI-stable, HER2-0, negative for other targetable mutations - Cycle 1 of FOLFOX and bevacizumab on 07/06/2022, oxaliplatin discontinued from 11/09/2022 (cycle 8) - XRT to the right neck from 07/14/2022 through 08/03/2022    Plan: 1.  Stage IV rectal adenocarcinoma, BRAF/RAS wild-type, MSI-stable: - PET scan on 11/03/2022: Very good response. - She is tolerating maintenance 5-FU and bevacizumab reasonably well. - She had IBS symptoms and acid reflux symptoms. - Reviewed labs today: Normal LFTs and creatinine.  CBC shows white count 2.7 with ANC 1.9.  Platelet count is  59,000.  Bilirubin is 3.6.  Last CEA has improved to 5.6. - Recommend maintenance 5-FU and bevacizumab today and in 2 weeks.  RTC 4 weeks for follow-up.  At that time we will switch her to every 3-week regimen.   2.  Leukopenia and thrombocytopenia: - Intermittent leukopenia and thrombocytopenia exacerbated by splenomegaly.  Closely monitor.   3.  Brain metastasis: - She underwent SRS for 3 mm enhancing lesion in the anterior right frontal lobe. - MRI of the brain on 11/30/2022: The lesion has resolved with no new lesions..    No orders of the defined types were placed in this encounter.     I,Samantha Reynolds,acting as a Neurosurgeon for Samantha Massed, MD.,have documented all relevant documentation on the behalf of Samantha Massed, MD,as directed by  Samantha Massed, MD while in the presence of Samantha Massed, MD.   I, Samantha Massed MD, have reviewed the above documentation for accuracy and completeness, and I agree with the above.   Samantha Massed, MD   9/11/20245:37 PM  CHIEF COMPLAINT:   Diagnosis: metastatic rectal cancer to liver    Cancer Staging  Malignant neoplasm of rectum [facility]) Staging form: Colon and Rectum, AJCC 8th Edition - Clinical stage from 03/13/2018: Stage IVA (cT3, cN1b, cM1a) - Signed by Samantha Massed, MD on 03/13/2018    Prior Therapy: 1. FOLFOX & vectibix x 7 cycles from 03/14/2018 to 06/27/2018. 2. XRT with Xeloda from 07/30/2018 to 09/06/2018. 3. Right liver lesion microwave ablation on 10/15/2018. 4. Low anterior resection and diverting ileostomy on 12/07/2018. 5. Right hepatic lobe microwave ablation on 08/15/19 6. SBRT to LLL and liver 11/15/2019 -12/13/2019. 7. SBRT to RUL 08/06/20 - 08/13/20 8. IMRT to 3 left lung lesions and subcarinal LN 09/21/21 - 10/05/21 9.  XRT to C6 07/14/22 - 08/03/22  Current Therapy:  FOLFOX and bevacizumab    HISTORY OF PRESENT ILLNESS:   Oncology History  Malignant neoplasm of rectum  (HCC)  02/26/2018 Initial Diagnosis   Rectal cancer (HCC)   03/13/2018 Cancer Staging   Staging form: Colon and Rectum, AJCC 8th Edition - Clinical stage from 03/13/2018: Stage IVA (cT3, cN1b, cM1a) - Signed by Samantha Massed, MD on 03/13/2018   03/14/2018 - 06/29/2018 Chemotherapy   The patient had palonosetron (ALOXI) injection 0.25 mg, 0.25 mg, Intravenous,  Once, 7 of 8 cycles Administration: 0.25 mg (03/14/2018), 0.25 mg (03/27/2018), 0.25 mg (04/18/2018), 0.25 mg (05/02/2018), 0.25 mg (05/16/2018), 0.25 mg (06/05/2018), 0.25 mg (06/27/2018) leucovorin 800 mg in dextrose 5 % 250 mL infusion, 772 mg, Intravenous,  Once, 7 of 8 cycles Administration: 800 mg (03/14/2018), 800 mg (03/27/2018), 800 mg (05/02/2018), 800 mg (05/16/2018), 700 mg (04/18/2018), 800 mg (06/05/2018), 800 mg (06/27/2018) oxaliplatin (ELOXATIN) 165 mg in dextrose 5 % 500 mL chemo infusion, 85 mg/m2 = 165 mg, Intravenous,  Once, 6 of 7 cycles Dose modification: 68 mg/m2 (80 % of original dose 85 mg/m2, Cycle 4, Reason: Provider Judgment) Administration: 165 mg (03/14/2018), 165 mg (03/27/2018), 130 mg (05/02/2018), 130 mg (05/16/2018), 130 mg (06/05/2018), 130 mg (06/27/2018) panitumumab (VECTIBIX) 500 mg in sodium chloride 0.9 % 100 mL chemo infusion, 480 mg, Intravenous,  Once, 4 of 5 cycles Administration: 500 mg (04/18/2018), 480 mg (05/02/2018), 480 mg (05/16/2018), 480 mg (06/05/2018) fluorouracil (ADRUCIL) chemo injection 750 mg, 400 mg/m2 = 750 mg (100 % of original dose 400 mg/m2), Intravenous,  Once, 6 of 7 cycles Dose modification: 400 mg/m2 (original dose 400 mg/m2, Cycle 1), 400 mg/m2 (original dose 400 mg/m2, Cycle 3) Administration: 750 mg (03/14/2018), 750 mg (04/18/2018), 750 mg (05/02/2018), 750 mg (05/16/2018), 750 mg (06/05/2018), 750 mg (06/27/2018) fosaprepitant (EMEND) 150 mg, dexamethasone (DECADRON) 12 mg in sodium chloride 0.9 % 145 mL IVPB, , Intravenous,  Once, 4 of 5 cycles Administration:  (05/02/2018),  (05/16/2018),   (06/05/2018),  (06/27/2018) fluorouracil (ADRUCIL) 4,650 mg in sodium chloride 0.9 % 57 mL chemo infusion, 2,400 mg/m2 = 4,650 mg, Intravenous, 1 Day/Dose, 7 of 8 cycles Administration: 4,650 mg (03/14/2018), 4,650 mg (03/27/2018), 4,650 mg (04/18/2018), 4,650 mg (05/02/2018), 4,650 mg (05/16/2018), 4,650 mg (06/05/2018), 4,650 mg (06/27/2018)  for chemotherapy treatment.    07/06/2022 -  Chemotherapy   Patient is on Treatment Plan : COLORECTAL FOLFOX + Bevacizumab q14d        INTERVAL HISTORY:   Samantha Reynolds is a 57 y.o. female presenting to clinic today for follow up of metastatic rectal cancer to liver. She was last seen by me on 11/23/22.  Since her last visit, she underwent follow up brain MRI on 11/30/22 showing: resolution of previously seen enhancing lesion in right frontal lobe; no new lesions; unchanged subcentimeter enhancing lesion in left frontal calvarium.  Today, she states that she is doing well overall. Her appetite level is at 70%. Her energy level is at 7 3%.  PAST MEDICAL HISTORY:   Past Medical History: Past Medical History:  Diagnosis Date   Anxiety    Cancer (HCC) 2013   thyroid   Endometrial polyp    Endometrial thickening on ultra sound    GERD (gastroesophageal reflux disease)    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   Worse in her 74s when she was ill   H/O Hashimoto thyroiditis    History of chemotherapy    History of radiation therapy  History of thyroid cancer no recurrence   2013--  s/p  left lobe thyroidectomy--  follicular varient papillary / lymphocyctic thyroiditis   Hyperlipidemia 11/09/2015   Hypothyroidism    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HCC) 02/26/2018    Surgical History: Past Surgical History:  Procedure Laterality Date   BIOPSY  02/19/2018   Procedure: BIOPSY;  Surgeon: Malissa Hippo, MD;  [facility];  Service: Endoscopy;;  rectum   COLONOSCOPY N/A 08/20/2015   Procedure: COLONOSCOPY;  Surgeon: Malissa Hippo, MD;  [facility];   Service: Endoscopy;  Laterality: N/A;  730   COLONOSCOPY WITH PROPOFOL N/A 07/21/2021   Procedure: COLONOSCOPY WITH PROPOFOL;  Surgeon: Malissa Hippo, MD;  [facility];  Service: Endoscopy;  Laterality: N/A;  10:10   D & C HYSTEROOSCOPY W/ THERMACHOICE ENDOMETRIAL ABLATION  08-13-2004   DIVERTING ILEOSTOMY N/A 12/07/2018   Procedure: DIVERTING LOOP ILEOSTOMY;  Surgeon: Romie Levee, MD;  Location: [facility];  Service: General;  Laterality: N/A;   FLEXIBLE SIGMOIDOSCOPY N/A 02/19/2018   Procedure: FLEXIBLE SIGMOIDOSCOPY;  Surgeon: Malissa Hippo, MD;  [facility];  Service: Endoscopy;  Laterality: N/A;   HYSTEROSCOPY WITH D & C N/A 04/30/2015   Procedure: DILATATION AND CURETTAGE / INTENDED HYSTEROSCOPY;  Surgeon: Marcelle Overlie, MD;  Location: [facility];  Service: Gynecology;  Laterality: N/A;   ILEOSTOMY CLOSURE N/A 04/17/2019   Procedure: LOOP ILEOSTOMY REVERSAL;  Surgeon: Romie Levee, MD;  Location: [facility];  Service: General;  Laterality: N/A;   IR US GUIDE BX ASP/DRAIN  03/09/2018   LAPAROSCOPIC CHOLECYSTECTOMY  04/1996   LAPAROSCOPY N/A 12/11/2018   Procedure: LAPAROSCOPY  ILEOSTOMY REVISION AND ABDOMINAL WASHOUT;  Surgeon: Romie Levee, MD;  Location: [facility];  Service: General;  Laterality: N/A;   Liver Microwave   10/17/2018   POLYPECTOMY  08/20/2015   Procedure: POLYPECTOMY;  Surgeon: Malissa Hippo, MD;  [facility];  Service: Endoscopy;;  Splenic flexure polypectomy   POLYPECTOMY  02/19/2018   Procedure: POLYPECTOMY;  Surgeon: Malissa Hippo, MD;  [facility];  Service: Endoscopy;;  rectum   PORTACATH PLACEMENT N/A 03/14/2018   Procedure: INSERTION PORT-A-CATH;  Surgeon: Franky Macho, MD;  Location: [facility];  Service: General;  Laterality: N/A;   REDUCTION INCARCERATED UTERUS  06-20-2000   intrauterine preg. 13 wks /  urinary retention   THYROID LOBECTOMY  11/24/2011   Procedure: THYROID LOBECTOMY;  Surgeon: Velora Heckler, MD;  Location: [facility];  Service: General;  Laterality: Left;  Left Thyroid Lobectomy   TUBAL LIGATION  2002   XI ROBOTIC ASSISTED LOWER ANTERIOR RESECTION N/A 12/07/2018   Procedure: XI ROBOTIC ASSISTED LOWER ANTERIOR RESECTION, SPENIC FLEXURE IMMOBILIZATION, COLOANAL ANASTOMOSIS, RIGID PROCTOSCOPY;  Surgeon: Romie Levee, MD;  Location: [facility];  Service: General;  Laterality: N/A;    Social History: Social History   Socioeconomic History   Marital status: Married    Spouse name: Not on file   Number of children: 4   Years of education: Not on file   Highest education level: Not on file  Occupational History    Comment: Systems developer at [employer]  Tobacco Use   Smoking status: Former    Current packs/day: 0.00    Average packs/day: 0.3 packs/day for 10.0 years (2.5 ttl pk-yrs)    Types: Cigarettes    Start date: 10/30/1981    Quit date: 10/31/1991    Years since quitting: 31.1  Smokeless tobacco: Never  Vaping Use   Vaping status: Never Used  Substance and Sexual Activity   Alcohol use: No   Drug use: No   Sexual activity: Not Currently  Other Topics Concern   Not on file  Social History Narrative   Lives with husband Loraine Leriche and 68 year old son Almeta Monas   Husband is Technical brewer of [employer]   Social Determinants of Health   Financial Resource Strain: Low Risk  (02/26/2018)   Overall Financial Resource Strain (CARDIA)    Difficulty of Paying Living Expenses: Not hard at all  Food Insecurity: No Food Insecurity (12/05/2022)   Hunger Vital Sign    Worried About Running Out of Food in the Last Year: Never true    Ran Out of Food in the Last Year: Never true  Transportation Needs: No Transportation Needs (12/05/2022)   PRAPARE - Administrator, Civil Service (Medical): No    Lack of Transportation (Non-Medical): No  Physical Activity: Inactive (02/26/2018)   Exercise Vital Sign    Days of Exercise per Week: 0 days    Minutes of Exercise per Session: 0 min   Stress: Stress Concern Present (02/26/2018)   [employer] of Occupational Health - Occupational Stress Questionnaire    Feeling of Stress : To some extent  Social Connections: Socially Integrated (02/26/2018)   Social Connection and Isolation Panel [NHANES]    Frequency of Communication with Friends and Family: More than three times a week    Frequency of Social Gatherings with Friends and Family: Twice a week    Attends Religious Services: More than 4 times per year    Active Member of [employer] or Organizations: Yes    Attends Engineer, structural: More than 4 times per year    Marital Status: Married  Catering manager Violence: Not At Risk (12/05/2022)   Humiliation, Afraid, Rape, and Kick questionnaire    Fear of Current or Ex-Partner: No    Emotionally Abused: No    Physically Abused: No    Sexually Abused: No    Family History: Family History  Problem Relation Age of Onset   Hypertension Mother    Hypertension Father    Prostate cancer Father    Cancer Maternal Aunt        spine/back   Cancer Paternal Grandfather        lung   Healthy Son    Healthy Son    Healthy Son    Healthy Son    Colon cancer Neg Hx     Current Medications:  Current Outpatient Medications:    ALPRAZolam (XANAX) 0.5 MG tablet, Take 1 tablet (0.5 mg total) by mouth 3 (three) times daily as needed for anxiety., Disp: 90 tablet, Rfl: 2   amitriptyline (ELAVIL) 10 MG tablet, Take 1 tablet (10 mg total) by mouth 2 (two) times daily., Disp: 180 tablet, Rfl: 3   ARMOUR THYROID 60 MG tablet, Take 1 tablet (60 mg total) by mouth daily., Disp: 90 tablet, Rfl: 1   ARMOUR THYROID 60 MG tablet, Take 1 tablet (60 mg total) by mouth daily., Disp: 90 tablet, Rfl: 1   ARMOUR THYROID 60 MG tablet, Take 1 tablet (60 mg total) by mouth daily., Disp: 90 tablet, Rfl: 1   loperamide (IMODIUM) 2 MG capsule, Take 1 capsule (2 mg total) by mouth 3 (three) times daily. (Patient taking differently: Take 2 mg by  mouth daily.), Disp: 30 capsule, Rfl: 0  pregabalin (LYRICA) 25 MG capsule, Take 1 capsule (25 mg total) by mouth 2 (two) times daily., Disp: 60 capsule, Rfl: 3   prochlorperazine (COMPAZINE) 10 MG tablet, Take 1 tablet (10 mg total) by mouth every 6 (six) hours as needed for nausea or vomiting., Disp: 30 tablet, Rfl: 4   sucralfate (CARAFATE) 1 g tablet, Take 1 tablet (1 g total) by mouth 4 (four) times daily -  with meals and at bedtime. Crush 1 tablet in 1 oz water and drink 5 min before meals for radiation induced esophagitis, Disp: 120 tablet, Rfl: 2   thyroid (ARMOUR THYROID) 15 MG tablet, Take 1 tablet (15 mg total) by mouth daily as needed with 60 mg armour thyroid tablet (75 mg total)., Disp: 30 tablet, Rfl: 3   tiZANidine (ZANAFLEX) 4 MG tablet, Take 1 tablet (4 mg total) by mouth every 6 (six) hours as needed for muscle spasms (take for tightness and pain in the scapular region)., Disp: 56 tablet, Rfl: 2 No current facility-administered medications for this visit.  Facility-Administered Medications Ordered in Other Visits:    clindamycin (CLEOCIN) 900 mg in dextrose 5 % 50 mL IVPB, 900 mg, Intravenous, 60 min Pre-Op **AND** gentamicin (GARAMYCIN) 350 mg in dextrose 5 % 50 mL IVPB, 5 mg/kg, Intravenous, 60 min Pre-Op, Romie Levee, MD   clindamycin (CLEOCIN) 900 mg in dextrose 5 % 50 mL IVPB, 900 mg, Intravenous, 60 min Pre-Op **AND** gentamicin (GARAMYCIN) 5 mg/kg in dextrose 5 % 50 mL IVPB, 5 mg/kg, Intravenous, 60 min Pre-Op, Romie Levee, MD   fluorouracil (ADRUCIL) 3,500 mg in sodium chloride 0.9 % 80 mL chemo infusion, 1,920 mg/m2 (Treatment Plan Recorded), Intravenous, 1 day or 1 dose, Samantha Massed, MD, Infusion Verify at 12/07/22 1339   sodium chloride 0.9 % 1,000 mL with potassium chloride 20 mEq, magnesium sulfate 2 g infusion, , Intravenous, Once, Samantha Massed, MD   Allergies: Allergies  Allergen Reactions   Oxaliplatin Other (See Comments)    Patient had  hypersensitivity reaction to Oxaliplatin. Patient c/o itching in her ears and eyes. Pt also c/o being hot, flushed, dizzy with redness on pt's face, arms, and legs. Pt c/o tingling in her hands and legs. Pt also had a dry throat with coughing. See progress note from 09/07/22 at 1130. Patient was not able to complete Oxaliplatin infusion.   Second reaction 10/26/2022. Itching in ears, heart racing, & face flushing. See progress note from 10/26/2022. Able to complete infusion after medications.   Demerol [Meperidine] Itching    All over the body.   Penicillins Hives     childhood, does not remember if it spread all over the body or not. Did it involve swelling of the face/tongue/throat, SOB, or low BP? No Did it involve sudden or severe rash/hives, skin peeling, or any reaction on the inside of your mouth or nose? Yes Did you need to seek medical attention at a hospital or doctor's office? Yes When did it last happen?      childhood allergy If all above answers are "NO", may proceed with cephalosporin use.    Levaquin [Levofloxacin] Other (See Comments)    Patient has Aortic Aneurysm and is not indicated with this diagnosis   Other Hives and Other (See Comments)    Fresh coconut    Vancomycin Rash    Will need Benadryl prior to administration per IV. "Red man syndrome"    REVIEW OF SYSTEMS:   Review of Systems  Constitutional:  Negative for chills, fatigue  and fever.  HENT:   Negative for lump/mass, mouth sores, nosebleeds, sore throat and trouble swallowing.   Eyes:  Negative for eye problems.  Respiratory:  Negative for cough and shortness of breath.   Cardiovascular:  Negative for chest pain, leg swelling and palpitations.  Gastrointestinal:  Negative for abdominal pain, constipation, diarrhea, nausea and vomiting.  Genitourinary:  Negative for bladder incontinence, difficulty urinating, dysuria, frequency, hematuria and nocturia.   Musculoskeletal:  Negative for arthralgias, back pain,  flank pain, myalgias and neck pain.  Skin:  Negative for itching and rash.  Neurological:  Negative for dizziness, headaches and numbness.  Hematological:  Does not bruise/bleed easily.  Psychiatric/Behavioral:  Negative for depression, sleep disturbance and suicidal ideas. The patient is not nervous/anxious.   All other systems reviewed and are negative.    VITALS:   Blood pressure 111/84, pulse (!) 105, temperature 97.9 F (36.6 C), temperature source Oral, resp. rate 18, weight 134 lb 14.4 oz (61.2 kg), last menstrual period 02/16/2015, SpO2 100%.  Wt Readings from Last 3 Encounters:  12/07/22 134 lb 14.4 oz (61.2 kg)  11/23/22 135 lb 6.4 oz (61.4 kg)  11/22/22 136 lb (61.7 kg)    Body mass index is 21.45 kg/m.  Performance status (ECOG): 1 - Symptomatic but completely ambulatory  PHYSICAL EXAM:   Physical Exam Vitals and nursing note reviewed. Exam conducted with a chaperone present.  Constitutional:      Appearance: Normal appearance.  Cardiovascular:     Rate and Rhythm: Normal rate and regular rhythm.     Pulses: Normal pulses.     Heart sounds: Normal heart sounds.  Pulmonary:     Effort: Pulmonary effort is normal.     Breath sounds: Normal breath sounds.  Abdominal:     Palpations: Abdomen is soft. There is no hepatomegaly, splenomegaly or mass.     Tenderness: There is no abdominal tenderness.  Musculoskeletal:     Right lower leg: No edema.     Left lower leg: No edema.  Lymphadenopathy:     Cervical: No cervical adenopathy.     Right cervical: No superficial, deep or posterior cervical adenopathy.    Left cervical: No superficial, deep or posterior cervical adenopathy.     Upper Body:     Right upper body: No supraclavicular or axillary adenopathy.     Left upper body: No supraclavicular or axillary adenopathy.  Neurological:     General: No focal deficit present.     Mental Status: She is alert and oriented to person, place, and time.  Psychiatric:         Mood and Affect: Mood normal.        Behavior: Behavior normal.     LABS:      Latest Ref Rng & Units 12/07/2022    9:43 AM 11/22/2022    1:43 PM 11/09/2022    8:58 AM  CBC  WBC 4.0 - 10.5 K/uL 2.7  2.6  3.0   Hemoglobin 12.0 - 15.0 g/dL 45.4  09.8  11.9   Hematocrit 36.0 - 46.0 % 34.0  31.8  32.8   Platelets 150 - 400 K/uL 59  69  60       Latest Ref Rng & Units 12/07/2022    9:43 AM 11/22/2022    1:43 PM 11/09/2022    8:58 AM  CMP  Glucose 70 - 99 mg/dL 88  90  89   BUN 6 - 20 mg/dL 12  12  8  Creatinine 0.44 - 1.00 mg/dL 8.46  9.62  9.52   Sodium 135 - 145 mmol/L 136  134  136   Potassium 3.5 - 5.1 mmol/L 3.2  3.5  3.2   Chloride 98 - 111 mmol/L 104  103  106   CO2 22 - 32 mmol/L 23  23  22    Calcium 8.9 - 10.3 mg/dL 8.8  8.7  8.4   Total Protein 6.5 - 8.1 g/dL 7.0  6.7  6.8   Total Bilirubin 0.3 - 1.2 mg/dL 3.6  3.0  1.4   Alkaline Phos 38 - 126 U/L 60  65  77   AST 15 - 41 U/L 20  25  21    ALT 0 - 44 U/L 14  16  14       Lab Results  Component Value Date   CEA1 5.6 (H) 11/09/2022   /  CEA  Date Value Ref Range Status  11/09/2022 5.6 (H) 0.0 - 4.7 ng/mL Final    Comment:    (NOTE)                             Nonsmokers          <3.9                             Smokers             <5.6 Roche [employer] Electrochemiluminescence Immunoassay (ECLIA) Values obtained with different assay methods or kits cannot be used interchangeably.  Results cannot be interpreted as absolute evidence of the presence or absence of malignant disease. Performed At: [facility] [address] [place], [place] [id number] Jolene Schimke MD UU:[id number]    No results found for: "PSA1" No results found for: "CAN199" No results found for: "CAN125"  No results found for: "TOTALPROTELP", "ALBUMINELP", "A1GS", "A2GS", "BETS", "BETA2SER", "GAMS", "MSPIKE", "SPEI" Lab Results  Component Value Date   TIBC 333 09/07/2022   TIBC 364 05/11/2022   TIBC 385 11/09/2020    FERRITIN 89 09/07/2022   FERRITIN 61 05/11/2022   FERRITIN 54 11/09/2020   IRONPCTSAT 36 (H) 09/07/2022   IRONPCTSAT 26 05/11/2022   IRONPCTSAT 22 11/09/2020   Lab Results  Component Value Date   LDH 154 01/20/2020     STUDIES:   MR Brain W Wo Contrast  Result Date: 12/02/2022 CLINICAL DATA:  Brain metastases, assess treatment response. EXAM: MRI HEAD WITHOUT AND WITH CONTRAST TECHNIQUE: Multiplanar, multiecho pulse sequences of the brain and surrounding structures were obtained without and with intravenous contrast. CONTRAST:  7.38mL GADAVIST GADOBUTROL 1 MMOL/ML IV SOLN COMPARISON:  Brain MRI 08/18/2022 FINDINGS: Brain: The previously seen enhancing lesion in the right frontal lobe subcortical white matter has resolved. There are no new or enlarging lesions. There is no acute intracranial hemorrhage, extra-axial fluid collection, or acute infarct. Parenchymal volume is normal. The ventricles are normal in size. There is a minimal background chronic small-vessel ischemic change The pituitary and suprasellar region are normal. There is no mass effect or midline shift. Vascular: Normal flow voids. Skull and upper cervical spine: The subcentimeter enhancing lesion in the left frontal calvarium is unchanged. Sinuses/Orbits: The paranasal sinuses are clear. The globes and orbits are unremarkable. Other: The mastoid air cells and middle ear cavities are clear. IMPRESSION: 1. The previously seen enhancing lesion in the right frontal white matter has resolved. No new lesions. 2.  Unchanged subcentimeter enhancing lesion in the left frontal calvarium. Electronically Signed   By: Lesia Hausen M.D.   On: 12/02/2022 15:54

## 2022-12-07 ENCOUNTER — Other Ambulatory Visit: Payer: Self-pay

## 2022-12-07 ENCOUNTER — Inpatient Hospital Stay: Payer: 59

## 2022-12-07 ENCOUNTER — Inpatient Hospital Stay (HOSPITAL_BASED_OUTPATIENT_CLINIC_OR_DEPARTMENT_OTHER): Payer: 59 | Admitting: Hematology

## 2022-12-07 VITALS — BP 123/75 | HR 85 | Resp 18

## 2022-12-07 DIAGNOSIS — Z801 Family history of malignant neoplasm of trachea, bronchus and lung: Secondary | ICD-10-CM | POA: Diagnosis not present

## 2022-12-07 DIAGNOSIS — D72819 Decreased white blood cell count, unspecified: Secondary | ICD-10-CM | POA: Diagnosis not present

## 2022-12-07 DIAGNOSIS — K219 Gastro-esophageal reflux disease without esophagitis: Secondary | ICD-10-CM | POA: Diagnosis not present

## 2022-12-07 DIAGNOSIS — C20 Malignant neoplasm of rectum: Secondary | ICD-10-CM | POA: Diagnosis not present

## 2022-12-07 DIAGNOSIS — F419 Anxiety disorder, unspecified: Secondary | ICD-10-CM | POA: Diagnosis not present

## 2022-12-07 DIAGNOSIS — Z880 Allergy status to penicillin: Secondary | ICD-10-CM | POA: Diagnosis not present

## 2022-12-07 DIAGNOSIS — Z881 Allergy status to other antibiotic agents status: Secondary | ICD-10-CM | POA: Diagnosis not present

## 2022-12-07 DIAGNOSIS — Z5112 Encounter for antineoplastic immunotherapy: Secondary | ICD-10-CM | POA: Diagnosis present

## 2022-12-07 DIAGNOSIS — Z9049 Acquired absence of other specified parts of digestive tract: Secondary | ICD-10-CM | POA: Diagnosis not present

## 2022-12-07 DIAGNOSIS — Z87891 Personal history of nicotine dependence: Secondary | ICD-10-CM | POA: Diagnosis not present

## 2022-12-07 DIAGNOSIS — Z5111 Encounter for antineoplastic chemotherapy: Secondary | ICD-10-CM | POA: Diagnosis present

## 2022-12-07 DIAGNOSIS — Z8249 Family history of ischemic heart disease and other diseases of the circulatory system: Secondary | ICD-10-CM | POA: Diagnosis not present

## 2022-12-07 DIAGNOSIS — C7931 Secondary malignant neoplasm of brain: Secondary | ICD-10-CM | POA: Diagnosis not present

## 2022-12-07 DIAGNOSIS — Z79890 Hormone replacement therapy (postmenopausal): Secondary | ICD-10-CM | POA: Diagnosis not present

## 2022-12-07 DIAGNOSIS — Z79899 Other long term (current) drug therapy: Secondary | ICD-10-CM | POA: Diagnosis not present

## 2022-12-07 DIAGNOSIS — D696 Thrombocytopenia, unspecified: Secondary | ICD-10-CM | POA: Diagnosis not present

## 2022-12-07 DIAGNOSIS — Z85850 Personal history of malignant neoplasm of thyroid: Secondary | ICD-10-CM | POA: Diagnosis not present

## 2022-12-07 DIAGNOSIS — K589 Irritable bowel syndrome without diarrhea: Secondary | ICD-10-CM | POA: Diagnosis not present

## 2022-12-07 DIAGNOSIS — E785 Hyperlipidemia, unspecified: Secondary | ICD-10-CM | POA: Diagnosis not present

## 2022-12-07 DIAGNOSIS — Z8042 Family history of malignant neoplasm of prostate: Secondary | ICD-10-CM | POA: Diagnosis not present

## 2022-12-07 DIAGNOSIS — C787 Secondary malignant neoplasm of liver and intrahepatic bile duct: Secondary | ICD-10-CM | POA: Diagnosis not present

## 2022-12-07 DIAGNOSIS — Z885 Allergy status to narcotic agent status: Secondary | ICD-10-CM | POA: Diagnosis not present

## 2022-12-07 LAB — COMPREHENSIVE METABOLIC PANEL
ALT: 14 U/L (ref 0–44)
AST: 20 U/L (ref 15–41)
Albumin: 3.9 g/dL (ref 3.5–5.0)
Alkaline Phosphatase: 60 U/L (ref 38–126)
Anion gap: 9 (ref 5–15)
BUN: 12 mg/dL (ref 6–20)
CO2: 23 mmol/L (ref 22–32)
Calcium: 8.8 mg/dL — ABNORMAL LOW (ref 8.9–10.3)
Chloride: 104 mmol/L (ref 98–111)
Creatinine, Ser: 0.82 mg/dL (ref 0.44–1.00)
GFR, Estimated: >60 mL/min (ref >60)
Glucose, Bld: 88 mg/dL (ref 70–99)
Potassium: 3.2 mmol/L — ABNORMAL LOW (ref 3.5–5.1)
Sodium: 136 mmol/L (ref 135–145)
Total Bilirubin: 3.6 mg/dL — ABNORMAL HIGH (ref 0.3–1.2)
Total Protein: 7 g/dL (ref 6.5–8.1)

## 2022-12-07 LAB — CBC WITH DIFFERENTIAL/PLATELET
Abs Immature Granulocytes: 0.02 10*3/uL (ref 0.00–0.07)
Basophils Absolute: 0 10*3/uL (ref 0.0–0.1)
Basophils Relative: 1 %
Eosinophils Absolute: 0 10*3/uL (ref 0.0–0.5)
Eosinophils Relative: 0 %
HCT: 34 % — ABNORMAL LOW (ref 36.0–46.0)
Hemoglobin: 11.4 g/dL — ABNORMAL LOW (ref 12.0–15.0)
Immature Granulocytes: 1 %
Lymphocytes Relative: 18 %
Lymphs Abs: 0.5 10*3/uL — ABNORMAL LOW (ref 0.7–4.0)
MCH: 35.1 pg — ABNORMAL HIGH (ref 26.0–34.0)
MCHC: 33.5 g/dL (ref 30.0–36.0)
MCV: 104.6 fL — ABNORMAL HIGH (ref 80.0–100.0)
Monocytes Absolute: 0.2 10*3/uL (ref 0.1–1.0)
Monocytes Relative: 9 %
Neutro Abs: 1.9 10*3/uL (ref 1.7–7.7)
Neutrophils Relative %: 71 %
Platelets: 59 10*3/uL — ABNORMAL LOW (ref 150–400)
RBC: 3.25 MIL/uL — ABNORMAL LOW (ref 3.87–5.11)
RDW: 14.4 % (ref 11.5–15.5)
WBC: 2.7 10*3/uL — ABNORMAL LOW (ref 4.0–10.5)
nRBC: 0 % (ref 0.0–0.2)

## 2022-12-07 LAB — MAGNESIUM: Magnesium: 2.2 mg/dL (ref 1.7–2.4)

## 2022-12-07 MED ORDER — SODIUM CHLORIDE 0.9% FLUSH
10.0000 mL | INTRAVENOUS | Status: DC | PRN
  Administered 2022-12-07: 10 mL

## 2022-12-07 MED ORDER — SODIUM CHLORIDE 0.9 % IV SOLN
320.0000 mg/m2 | Freq: Once | INTRAVENOUS | Status: DC
  Filled 2022-12-07: qty 27

## 2022-12-07 MED ORDER — PALONOSETRON HCL INJECTION 0.25 MG/5ML
0.2500 mg | Freq: Once | INTRAVENOUS | Status: AC
  Administered 2022-12-07: 0.25 mg via INTRAVENOUS
  Filled 2022-12-07: qty 5

## 2022-12-07 MED ORDER — SODIUM CHLORIDE 0.9 % IV SOLN
5.0000 mg/kg | Freq: Once | INTRAVENOUS | Status: AC
  Administered 2022-12-07: 300 mg via INTRAVENOUS
  Filled 2022-12-07: qty 12

## 2022-12-07 MED ORDER — SODIUM CHLORIDE 0.9 % IV SOLN
1920.0000 mg/m2 | INTRAVENOUS | Status: DC
  Administered 2022-12-07: 3500 mg via INTRAVENOUS
  Filled 2022-12-07: qty 70

## 2022-12-07 MED ORDER — SODIUM CHLORIDE 0.9 % IV SOLN: Freq: Once | INTRAVENOUS | Status: AC

## 2022-12-07 MED ORDER — FLUOROURACIL CHEMO INJECTION 500 MG/10ML
320.0000 mg/m2 | Freq: Once | INTRAVENOUS | Status: AC
  Administered 2022-12-07: 500 mg via INTRAVENOUS
  Filled 2022-12-07: qty 10

## 2022-12-07 MED ORDER — SODIUM CHLORIDE 0.9 % IV SOLN
320.0000 mg/m2 | Freq: Once | INTRAVENOUS | Status: AC
  Administered 2022-12-07: 540 mg via INTRAVENOUS
  Filled 2022-12-07: qty 27

## 2022-12-07 MED ORDER — FAMOTIDINE IN NACL 20-0.9 MG/50ML-% IV SOLN: 20.0000 mg | Freq: Once | INTRAVENOUS | Status: DC

## 2022-12-07 MED ORDER — SODIUM CHLORIDE 0.9 % IV SOLN
20.0000 mg | Freq: Once | INTRAVENOUS | Status: AC
  Administered 2022-12-07: 20 mg via INTRAVENOUS
  Filled 2022-12-07: qty 20

## 2022-12-07 NOTE — Patient Instructions (Signed)

## 2022-12-07 NOTE — Patient Instructions (Signed)
[facility]  Discharge Instructions: Thank you for choosing [facility] to provide your oncology and hematology care.  If you have a lab appointment with the Cancer Center - please note that after April 8th, 2024, all labs will be drawn in the cancer center.  You do not have to check in or register with the main entrance as you have in the past but will complete your check-in in the cancer center.  Wear comfortable clothing and clothing appropriate for easy access to any Portacath or PICC line.   We strive to give you quality time with your provider. You may need to reschedule your appointment if you arrive late (15 or more minutes).  Arriving late affects you and other patients whose appointments are after yours.  Also, if you miss three or more appointments without notifying the office, you may be dismissed from the clinic at the provider's discretion.      For prescription refill requests, have your pharmacy contact our office and allow 72 hours for refills to be completed.    Today you received the following chemotherapy and/or immunotherapy agents MVASI/Leucovorin/Fluorouracil.  Bevacizumab Injection What is this medication? BEVACIZUMAB (be va SIZ yoo mab) treats some types of cancer. It works by blocking a protein that causes cancer cells to grow and multiply. This helps to slow or stop the spread of cancer cells. It is a monoclonal antibody. This medicine may be used for other purposes; ask your health care provider or pharmacist if you have questions. COMMON BRAND NAME(S): Alymsys, Avastin, MVASI, Omer Jack What should I tell my care team before I take this medication? They need to know if you have any of these conditions: Blood clots Coughing up blood Having or recent surgery Heart failure High blood pressure History of a connection between 2 or more body parts that do not usually connect (fistula) History of a tear in your stomach or  intestines Protein in your urine An unusual or allergic reaction to bevacizumab, other medications, foods, dyes, or preservatives Pregnant or trying to get pregnant Breast-feeding How should I use this medication? This medication is injected into a vein. It is given by your care team in a hospital or clinic setting. Talk to your care team the use of this medication in children. Special care may be needed. Overdosage: If you think you have taken too much of this medicine contact a poison control center or emergency room at once. NOTE: This medicine is only for you. Do not share this medicine with others. What if I miss a dose? Keep appointments for follow-up doses. It is important not to miss your dose. Call your care team if you are unable to keep an appointment. What may interact with this medication? Interactions are not expected. This list may not describe all possible interactions. Give your health care provider a list of all the medicines, herbs, non-prescription drugs, or dietary supplements you use. Also tell them if you smoke, drink alcohol, or use illegal drugs. Some items may interact with your medicine. What should I watch for while using this medication? Your condition will be monitored carefully while you are receiving this medication. You may need blood work while taking this medication. This medication may make you feel generally unwell. This is not uncommon as chemotherapy can affect healthy cells as well as cancer cells. Report any side effects. Continue your course of treatment even though you feel ill unless your care team tells you to stop. This  medication may increase your risk to bruise or bleed. Call your care team if you notice any unusual bleeding. Before having surgery, talk to your care team to make sure it is ok. This medication can increase the risk of poor healing of your surgical site or wound. You will need to stop this medication for 28 days before surgery. After  surgery, wait at least 28 days before restarting this medication. Make sure the surgical site or wound is healed enough before restarting this medication. Talk to your care team if questions. Talk to your care team if you may be pregnant. Serious birth defects can occur if you take this medication during pregnancy and for 6 months after the last dose. Contraception is recommended while taking this medication and for 6 months after the last dose. Your care team can help you find the option that works for you. Do not breastfeed while taking this medication and for 6 months after the last dose. This medication can cause infertility. Talk to your care team if you are concerned about your fertility. What side effects may I notice from receiving this medication? Side effects that you should report to your care team as soon as possible: Allergic reactions--skin rash, itching, hives, swelling of the face, lips, tongue, or throat Bleeding--bloody or black, tar-like stools, vomiting blood or Florella Mcneese material that looks like coffee grounds, red or dark Consuela Widener urine, small red or purple spots on skin, unusual bruising or bleeding Blood clot--pain, swelling, or warmth in the leg, shortness of breath, chest pain Heart attack--pain or tightness in the chest, shoulders, arms, or jaw, nausea, shortness of breath, cold or clammy skin, feeling faint or lightheaded Heart failure--shortness of breath, swelling of the ankles, feet, or hands, sudden weight gain, unusual weakness or fatigue Increase in blood pressure Infection--fever, chills, cough, sore throat, wounds that don't heal, pain or trouble when passing urine, general feeling of discomfort or being unwell Infusion reactions--chest pain, shortness of breath or trouble breathing, feeling faint or lightheaded Kidney injury--decrease in the amount of urine, swelling of the ankles, hands, or feet Stomach pain that is severe, does not go away, or gets worse Stroke--sudden  numbness or weakness of the face, arm, or leg, trouble speaking, confusion, trouble walking, loss of balance or coordination, dizziness, severe headache, change in vision Sudden and severe headache, confusion, change in vision, seizures, which may be signs of posterior reversible encephalopathy syndrome (PRES) Side effects that usually do not require medical attention (report to your care team if they continue or are bothersome): Back pain Change in taste Diarrhea Dry skin Increased tears Nosebleed This list may not describe all possible side effects. Call your doctor for medical advice about side effects. You may report side effects to FDA at 1-800-FDA-1088. Where should I keep my medication? This medication is given in a hospital or clinic. It will not be stored at home. NOTE: This sheet is a summary. It may not cover all possible information. If you have questions about this medicine, talk to your doctor, pharmacist, or health care provider.  2024 Elsevier/Gold Standard (2021-07-30 00:00:00)    Leucovorin Injection What is this medication? LEUCOVORIN (loo koe VOR in) prevents side effects from certain medications, such as methotrexate. It works by increasing folate levels. This helps protect healthy cells in your body. It may also be used to treat anemia caused by low levels of folate. It can also be used with fluorouracil, a type of chemotherapy, to treat colorectal cancer. It works by  increasing the effects of fluorouracil in the body. This medicine may be used for other purposes; ask your health care provider or pharmacist if you have questions. What should I tell my care team before I take this medication? They need to know if you have any of these conditions: Anemia from low levels of vitamin B12 in the blood An unusual or allergic reaction to leucovorin, folic acid, other medications, foods, dyes, or preservatives Pregnant or trying to get pregnant Breastfeeding How should I use  this medication? This medication is injected into a vein or a muscle. It is given by your care team in a hospital or clinic setting. Talk to your care team about the use of this medication in children. Special care may be needed. Overdosage: If you think you have taken too much of this medicine contact a poison control center or emergency room at once. NOTE: This medicine is only for you. Do not share this medicine with others. What if I miss a dose? Keep appointments for follow-up doses. It is important not to miss your dose. Call your care team if you are unable to keep an appointment. What may interact with this medication? Capecitabine Fluorouracil Phenobarbital Phenytoin Primidone Trimethoprim;sulfamethoxazole This list may not describe all possible interactions. Give your health care provider a list of all the medicines, herbs, non-prescription drugs, or dietary supplements you use. Also tell them if you smoke, drink alcohol, or use illegal drugs. Some items may interact with your medicine. What should I watch for while using this medication? Your condition will be monitored carefully while you are receiving this medication. This medication may increase the side effects of 5-fluorouracil. Tell your care team if you have diarrhea or mouth sores that do not get better or that get worse. What side effects may I notice from receiving this medication? Side effects that you should report to your care team as soon as possible: Allergic reactions--skin rash, itching, hives, swelling of the face, lips, tongue, or throat This list may not describe all possible side effects. Call your doctor for medical advice about side effects. You may report side effects to FDA at 1-800-FDA-1088. Where should I keep my medication? This medication is given in a hospital or clinic. It will not be stored at home. NOTE: This sheet is a summary. It may not cover all possible information. If you have questions about  this medicine, talk to your doctor, pharmacist, or health care provider.  2024 Elsevier/Gold Standard (2021-08-17 00:00:00)    Fluorouracil Injection What is this medication? FLUOROURACIL (flure oh YOOR a sil) treats some types of cancer. It works by slowing down the growth of cancer cells. This medicine may be used for other purposes; ask your health care provider or pharmacist if you have questions. COMMON BRAND NAME(S): Adrucil What should I tell my care team before I take this medication? They need to know if you have any of these conditions: Blood disorders Dihydropyrimidine dehydrogenase (DPD) deficiency Infection, such as chickenpox, cold sores, herpes Kidney disease Liver disease Poor nutrition Recent or ongoing radiation therapy An unusual or allergic reaction to fluorouracil, other medications, foods, dyes, or preservatives If you or your partner are pregnant or trying to get pregnant Breast-feeding How should I use this medication? This medication is injected into a vein. It is administered by your care team in a hospital or clinic setting. Talk to your care team about the use of this medication in children. Special care may be needed. Overdosage: If you  think you have taken too much of this medicine contact a poison control center or emergency room at once. NOTE: This medicine is only for you. Do not share this medicine with others. What if I miss a dose? Keep appointments for follow-up doses. It is important not to miss your dose. Call your care team if you are unable to keep an appointment. What may interact with this medication? Do not take this medication with any of the following: Live virus vaccines This medication may also interact with the following: Medications that treat or prevent blood clots, such as warfarin, enoxaparin, dalteparin This list may not describe all possible interactions. Give your health care provider a list of all the medicines, herbs,  non-prescription drugs, or dietary supplements you use. Also tell them if you smoke, drink alcohol, or use illegal drugs. Some items may interact with your medicine. What should I watch for while using this medication? Your condition will be monitored carefully while you are receiving this medication. This medication may make you feel generally unwell. This is not uncommon as chemotherapy can affect healthy cells as well as cancer cells. Report any side effects. Continue your course of treatment even though you feel ill unless your care team tells you to stop. In some cases, you may be given additional medications to help with side effects. Follow all directions for their use. This medication may increase your risk of getting an infection. Call your care team for advice if you get a fever, chills, sore throat, or other symptoms of a cold or flu. Do not treat yourself. Try to avoid being around people who are sick. This medication may increase your risk to bruise or bleed. Call your care team if you notice any unusual bleeding. Be careful brushing or flossing your teeth or using a toothpick because you may get an infection or bleed more easily. If you have any dental work done, tell your dentist you are receiving this medication. Avoid taking medications that contain aspirin, acetaminophen, ibuprofen, naproxen, or ketoprofen unless instructed by your care team. These medications may hide a fever. Do not treat diarrhea with over the counter products. Contact your care team if you have diarrhea that lasts more than 2 days or if it is severe and watery. This medication can make you more sensitive to the sun. Keep out of the sun. If you cannot avoid being in the sun, wear protective clothing and sunscreen. Do not use sun lamps, tanning beds, or tanning booths. Talk to your care team if you or your partner wish to become pregnant or think you might be pregnant. This medication can cause serious birth defects if  taken during pregnancy and for 3 months after the last dose. A reliable form of contraception is recommended while taking this medication and for 3 months after the last dose. Talk to your care team about effective forms of contraception. Do not father a child while taking this medication and for 3 months after the last dose. Use a condom while having sex during this time period. Do not breastfeed while taking this medication. This medication may cause infertility. Talk to your care team if you are concerned about your fertility. What side effects may I notice from receiving this medication? Side effects that you should report to your care team as soon as possible: Allergic reactions--skin rash, itching, hives, swelling of the face, lips, tongue, or throat Heart attack--pain or tightness in the chest, shoulders, arms, or jaw, nausea, shortness of breath,  cold or clammy skin, feeling faint or lightheaded Heart failure--shortness of breath, swelling of the ankles, feet, or hands, sudden weight gain, unusual weakness or fatigue Heart rhythm changes--fast or irregular heartbeat, dizziness, feeling faint or lightheaded, chest pain, trouble breathing High ammonia level--unusual weakness or fatigue, confusion, loss of appetite, nausea, vomiting, seizures Infection--fever, chills, cough, sore throat, wounds that don't heal, pain or trouble when passing urine, general feeling of discomfort or being unwell Low red blood cell level--unusual weakness or fatigue, dizziness, headache, trouble breathing Pain, tingling, or numbness in the hands or feet, muscle weakness, change in vision, confusion or trouble speaking, loss of balance or coordination, trouble walking, seizures Redness, swelling, and blistering of the skin over hands and feet Severe or prolonged diarrhea Unusual bruising or bleeding Side effects that usually do not require medical attention (report to your care team if they continue or are  bothersome): Dry skin Headache Increased tears Nausea Pain, redness, or swelling with sores inside the mouth or throat Sensitivity to light Vomiting This list may not describe all possible side effects. Call your doctor for medical advice about side effects. You may report side effects to FDA at 1-800-FDA-1088. Where should I keep my medication? This medication is given in a hospital or clinic. It will not be stored at home. NOTE: This sheet is a summary. It may not cover all possible information. If you have questions about this medicine, talk to your doctor, pharmacist, or health care provider.  2024 Elsevier/Gold Standard (2021-07-20 00:00:00)        To help prevent nausea and vomiting after your treatment, we encourage you to take your nausea medication as directed.  BELOW ARE SYMPTOMS THAT SHOULD BE REPORTED IMMEDIATELY: *FEVER GREATER THAN 100.4 F (38 C) OR HIGHER *CHILLS OR SWEATING *NAUSEA AND VOMITING THAT IS NOT CONTROLLED WITH YOUR NAUSEA MEDICATION *UNUSUAL SHORTNESS OF BREATH *UNUSUAL BRUISING OR BLEEDING *URINARY PROBLEMS (pain or burning when urinating, or frequent urination) *BOWEL PROBLEMS (unusual diarrhea, constipation, pain near the anus) TENDERNESS IN MOUTH AND THROAT WITH OR WITHOUT PRESENCE OF ULCERS (sore throat, sores in mouth, or a toothache) UNUSUAL RASH, SWELLING OR PAIN  UNUSUAL VAGINAL DISCHARGE OR ITCHING   Items with * indicate a potential emergency and should be followed up as soon as possible or go to the Emergency Department if any problems should occur.  Please show the CHEMOTHERAPY ALERT CARD or IMMUNOTHERAPY ALERT CARD at check-in to the Emergency Department and triage nurse.  Should you have questions after your visit or need to cancel or reschedule your appointment, please contact [facility] [phone]  and follow the prompts.  Office hours are 8:00 a.m. to 4:30 p.m. Monday - Friday. Please note that voicemails left  after 4:00 p.m. may not be returned until the following business day.  We are closed weekends and major holidays. You have access to a nurse at all times for urgent questions. Please call the main number to the clinic [phone] and follow the prompts.  For any non-urgent questions, you may also contact your provider using MyChart. We now offer e-Visits for anyone 30 and older to request care online for non-urgent symptoms. For details visit [email or web address].   Also download the MyChart app! Go to the app store, search "MyChart", open the app, select [place], and log in with your MyChart username and password.

## 2022-12-07 NOTE — Progress Notes (Signed)
Patient presents today for chemotherapy infusion. Patient is in satisfactory condition with no new complaints voiced.  Vital signs are stable.  Labs reviewed by Dr. Ellin Saba during the office visit.  Platelets today are 59 and bilirubin is 3.6.  MD aware.  Potassium today is 3.2.  Patient refused oral potassium supplements per standing orders.  We will proceed with treatment per MD orders.   No need for a urine today per Pryor Ochoa, [facility].  Last urine protein was on 11/09/2022.    Patient tolerated treatment well with no complaints voiced. Home infusion 5FU pump connected with no issues.   Patient left ambulatory with husband in stable condition.  Vital signs stable at discharge.  Follow up as scheduled.

## 2022-12-07 NOTE — Progress Notes (Signed)
Patient has been examined by Dr. Ellin Saba. Vital signs and labs have been reviewed by MD - ANC, Creatinine, LFTs (total bilirubin 3.6), hemoglobin, and platelets (59) are within treatment parameters per M.D. - pt may proceed with treatment.  Primary RN and pharmacy notified.

## 2022-12-08 ENCOUNTER — Other Ambulatory Visit: Payer: Self-pay | Admitting: Radiation Therapy

## 2022-12-08 DIAGNOSIS — C7931 Secondary malignant neoplasm of brain: Secondary | ICD-10-CM

## 2022-12-08 LAB — CEA: CEA: 4.7 ng/mL (ref 0.0–4.7)

## 2022-12-09 ENCOUNTER — Other Ambulatory Visit: Payer: Self-pay

## 2022-12-09 ENCOUNTER — Inpatient Hospital Stay: Payer: 59

## 2022-12-09 VITALS — BP 103/71 | HR 88 | Temp 99.0°F | Resp 18

## 2022-12-09 DIAGNOSIS — Z5112 Encounter for antineoplastic immunotherapy: Secondary | ICD-10-CM | POA: Diagnosis not present

## 2022-12-09 DIAGNOSIS — C20 Malignant neoplasm of rectum: Secondary | ICD-10-CM

## 2022-12-09 MED ORDER — HEPARIN SOD (PORK) LOCK FLUSH 100 UNIT/ML IV SOLN
500.0000 [IU] | Freq: Once | INTRAVENOUS | Status: AC | PRN
  Administered 2022-12-09: 500 [IU]

## 2022-12-09 MED ORDER — SODIUM CHLORIDE 0.9% FLUSH
10.0000 mL | INTRAVENOUS | Status: DC | PRN
  Administered 2022-12-09: 10 mL

## 2022-12-09 NOTE — Progress Notes (Signed)
Patient presents today for pump d/c. Vital signs are stable. Port a cath site clean, dry, and intact. Port flushed with 10 mls of Normal Saline and 500 Units of Heparin. Needle removed intact. Band aid applied. Patient has no complaints at this time. Discharged from clinic ambulatory and in stable condition. Patient alert and oriented.

## 2022-12-09 NOTE — Patient Instructions (Signed)
[facility]  Discharge Instructions: Thank you for choosing [facility] to provide your oncology and hematology care.  If you have a lab appointment with the Cancer Center - please note that after April 8th, 2024, all labs will be drawn in the cancer center.  You do not have to check in or register with the main entrance as you have in the past but will complete your check-in in the cancer center.  Wear comfortable clothing and clothing appropriate for easy access to any Portacath or PICC line.   We strive to give you quality time with your provider. You may need to reschedule your appointment if you arrive late (15 or more minutes).  Arriving late affects you and other patients whose appointments are after yours.  Also, if you miss three or more appointments without notifying the office, you may be dismissed from the clinic at the provider's discretion.      For prescription refill requests, have your pharmacy contact our office and allow 72 hours for refills to be completed.    Today you received the following chemotherapy and/or immunotherapy agents Adrucil. Fluorouracil Injection What is this medication? FLUOROURACIL (flure oh YOOR a sil) treats some types of cancer. It works by slowing down the growth of cancer cells. This medicine may be used for other purposes; ask your health care provider or pharmacist if you have questions. COMMON BRAND NAME(S): Adrucil What should I tell my care team before I take this medication? They need to know if you have any of these conditions: Blood disorders Dihydropyrimidine dehydrogenase (DPD) deficiency Infection, such as chickenpox, cold sores, herpes Kidney disease Liver disease Poor nutrition Recent or ongoing radiation therapy An unusual or allergic reaction to fluorouracil, other medications, foods, dyes, or preservatives If you or your partner are pregnant or trying to get pregnant Breast-feeding How should  I use this medication? This medication is injected into a vein. It is administered by your care team in a hospital or clinic setting. Talk to your care team about the use of this medication in children. Special care may be needed. Overdosage: If you think you have taken too much of this medicine contact a poison control center or emergency room at once. NOTE: This medicine is only for you. Do not share this medicine with others. What if I miss a dose? Keep appointments for follow-up doses. It is important not to miss your dose. Call your care team if you are unable to keep an appointment. What may interact with this medication? Do not take this medication with any of the following: Live virus vaccines This medication may also interact with the following: Medications that treat or prevent blood clots, such as warfarin, enoxaparin, dalteparin This list may not describe all possible interactions. Give your health care provider a list of all the medicines, herbs, non-prescription drugs, or dietary supplements you use. Also tell them if you smoke, drink alcohol, or use illegal drugs. Some items may interact with your medicine. What should I watch for while using this medication? Your condition will be monitored carefully while you are receiving this medication. This medication may make you feel generally unwell. This is not uncommon as chemotherapy can affect healthy cells as well as cancer cells. Report any side effects. Continue your course of treatment even though you feel ill unless your care team tells you to stop. In some cases, you may be given additional medications to help with side effects. Follow all directions  for their use. This medication may increase your risk of getting an infection. Call your care team for advice if you get a fever, chills, sore throat, or other symptoms of a cold or flu. Do not treat yourself. Try to avoid being around people who are sick. This medication may increase  your risk to bruise or bleed. Call your care team if you notice any unusual bleeding. Be careful brushing or flossing your teeth or using a toothpick because you may get an infection or bleed more easily. If you have any dental work done, tell your dentist you are receiving this medication. Avoid taking medications that contain aspirin, acetaminophen, ibuprofen, naproxen, or ketoprofen unless instructed by your care team. These medications may hide a fever. Do not treat diarrhea with over the counter products. Contact your care team if you have diarrhea that lasts more than 2 days or if it is severe and watery. This medication can make you more sensitive to the sun. Keep out of the sun. If you cannot avoid being in the sun, wear protective clothing and sunscreen. Do not use sun lamps, tanning beds, or tanning booths. Talk to your care team if you or your partner wish to become pregnant or think you might be pregnant. This medication can cause serious birth defects if taken during pregnancy and for 3 months after the last dose. A reliable form of contraception is recommended while taking this medication and for 3 months after the last dose. Talk to your care team about effective forms of contraception. Do not father a child while taking this medication and for 3 months after the last dose. Use a condom while having sex during this time period. Do not breastfeed while taking this medication. This medication may cause infertility. Talk to your care team if you are concerned about your fertility. What side effects may I notice from receiving this medication? Side effects that you should report to your care team as soon as possible: Allergic reactions--skin rash, itching, hives, swelling of the face, lips, tongue, or throat Heart attack--pain or tightness in the chest, shoulders, arms, or jaw, nausea, shortness of breath, cold or clammy skin, feeling faint or lightheaded Heart failure--shortness of breath,  swelling of the ankles, feet, or hands, sudden weight gain, unusual weakness or fatigue Heart rhythm changes--fast or irregular heartbeat, dizziness, feeling faint or lightheaded, chest pain, trouble breathing High ammonia level--unusual weakness or fatigue, confusion, loss of appetite, nausea, vomiting, seizures Infection--fever, chills, cough, sore throat, wounds that don't heal, pain or trouble when passing urine, general feeling of discomfort or being unwell Low red blood cell level--unusual weakness or fatigue, dizziness, headache, trouble breathing Pain, tingling, or numbness in the hands or feet, muscle weakness, change in vision, confusion or trouble speaking, loss of balance or coordination, trouble walking, seizures Redness, swelling, and blistering of the skin over hands and feet Severe or prolonged diarrhea Unusual bruising or bleeding Side effects that usually do not require medical attention (report to your care team if they continue or are bothersome): Dry skin Headache Increased tears Nausea Pain, redness, or swelling with sores inside the mouth or throat Sensitivity to light Vomiting This list may not describe all possible side effects. Call your doctor for medical advice about side effects. You may report side effects to FDA at 1-800-FDA-1088. Where should I keep my medication? This medication is given in a hospital or clinic. It will not be stored at home. NOTE: This sheet is a summary. It may not  cover all possible information. If you have questions about this medicine, talk to your doctor, pharmacist, or health care provider.  2024 Elsevier/Gold Standard (2021-07-20 00:00:00)       To help prevent nausea and vomiting after your treatment, we encourage you to take your nausea medication as directed.  BELOW ARE SYMPTOMS THAT SHOULD BE REPORTED IMMEDIATELY: *FEVER GREATER THAN 100.4 F (38 C) OR HIGHER *CHILLS OR SWEATING *NAUSEA AND VOMITING THAT IS NOT CONTROLLED  WITH YOUR NAUSEA MEDICATION *UNUSUAL SHORTNESS OF BREATH *UNUSUAL BRUISING OR BLEEDING *URINARY PROBLEMS (pain or burning when urinating, or frequent urination) *BOWEL PROBLEMS (unusual diarrhea, constipation, pain near the anus) TENDERNESS IN MOUTH AND THROAT WITH OR WITHOUT PRESENCE OF ULCERS (sore throat, sores in mouth, or a toothache) UNUSUAL RASH, SWELLING OR PAIN  UNUSUAL VAGINAL DISCHARGE OR ITCHING   Items with * indicate a potential emergency and should be followed up as soon as possible or go to the Emergency Department if any problems should occur.  Please show the CHEMOTHERAPY ALERT CARD or IMMUNOTHERAPY ALERT CARD at check-in to the Emergency Department and triage nurse.  Should you have questions after your visit or need to cancel or reschedule your appointment, please contact [facility] [phone]  and follow the prompts.  Office hours are 8:00 a.m. to 4:30 p.m. Monday - Friday. Please note that voicemails left after 4:00 p.m. may not be returned until the following business day.  We are closed weekends and major holidays. You have access to a nurse at all times for urgent questions. Please call the main number to the clinic [phone] and follow the prompts.  For any non-urgent questions, you may also contact your provider using MyChart. We now offer e-Visits for anyone 33 and older to request care online for non-urgent symptoms. For details visit [email or web address].   Also download the MyChart app! Go to the app store, search "MyChart", open the app, select [place], and log in with your MyChart username and password.

## 2022-12-13 ENCOUNTER — Encounter (HOSPITAL_COMMUNITY): Payer: Self-pay | Admitting: Hematology

## 2022-12-13 ENCOUNTER — Encounter: Payer: Self-pay | Admitting: Hematology

## 2022-12-19 ENCOUNTER — Other Ambulatory Visit: Payer: Self-pay

## 2022-12-20 ENCOUNTER — Inpatient Hospital Stay: Payer: 59

## 2022-12-21 ENCOUNTER — Inpatient Hospital Stay: Payer: 59

## 2022-12-21 ENCOUNTER — Encounter: Payer: 59 | Admitting: Nurse Practitioner

## 2022-12-21 VITALS — BP 104/69 | HR 80 | Temp 97.8°F | Resp 17

## 2022-12-21 DIAGNOSIS — C20 Malignant neoplasm of rectum: Secondary | ICD-10-CM

## 2022-12-21 DIAGNOSIS — Z5112 Encounter for antineoplastic immunotherapy: Secondary | ICD-10-CM | POA: Diagnosis not present

## 2022-12-21 LAB — COMPREHENSIVE METABOLIC PANEL
ALT: 16 U/L (ref 0–44)
AST: 25 U/L (ref 15–41)
Albumin: 3.9 g/dL (ref 3.5–5.0)
Alkaline Phosphatase: 72 U/L (ref 38–126)
Anion gap: 11 (ref 5–15)
BUN: 12 mg/dL (ref 6–20)
CO2: 22 mmol/L (ref 22–32)
Calcium: 8.6 mg/dL — ABNORMAL LOW (ref 8.9–10.3)
Chloride: 103 mmol/L (ref 98–111)
Creatinine, Ser: 0.9 mg/dL (ref 0.44–1.00)
GFR, Estimated: >60 mL/min (ref >60)
Glucose, Bld: 88 mg/dL (ref 70–99)
Potassium: 3.6 mmol/L (ref 3.5–5.1)
Sodium: 136 mmol/L (ref 135–145)
Total Bilirubin: 3 mg/dL — ABNORMAL HIGH (ref 0.3–1.2)
Total Protein: 6.8 g/dL (ref 6.5–8.1)

## 2022-12-21 LAB — URINALYSIS, DIPSTICK ONLY
Bilirubin Urine: NEGATIVE
Glucose, UA: NEGATIVE mg/dL
Hgb urine dipstick: NEGATIVE
Ketones, ur: NEGATIVE mg/dL
Leukocytes,Ua: NEGATIVE
Nitrite: NEGATIVE
Protein, ur: NEGATIVE mg/dL
Specific Gravity, Urine: 1.012 (ref 1.005–1.030)
pH: 5 (ref 5.0–8.0)

## 2022-12-21 LAB — CBC WITH DIFFERENTIAL/PLATELET
Abs Immature Granulocytes: 0.01 10*3/uL (ref 0.00–0.07)
Basophils Absolute: 0 10*3/uL (ref 0.0–0.1)
Basophils Relative: 0 %
Eosinophils Absolute: 0 10*3/uL (ref 0.0–0.5)
Eosinophils Relative: 1 %
HCT: 34.4 % — ABNORMAL LOW (ref 36.0–46.0)
Hemoglobin: 11.6 g/dL — ABNORMAL LOW (ref 12.0–15.0)
Immature Granulocytes: 0 %
Lymphocytes Relative: 15 %
Lymphs Abs: 0.4 10*3/uL — ABNORMAL LOW (ref 0.7–4.0)
MCH: 35.2 pg — ABNORMAL HIGH (ref 26.0–34.0)
MCHC: 33.7 g/dL (ref 30.0–36.0)
MCV: 104.2 fL — ABNORMAL HIGH (ref 80.0–100.0)
Monocytes Absolute: 0.3 10*3/uL (ref 0.1–1.0)
Monocytes Relative: 10 %
Neutro Abs: 1.9 10*3/uL (ref 1.7–7.7)
Neutrophils Relative %: 74 %
Platelets: 70 10*3/uL — ABNORMAL LOW (ref 150–400)
RBC: 3.3 MIL/uL — ABNORMAL LOW (ref 3.87–5.11)
RDW: 14.3 % (ref 11.5–15.5)
WBC: 2.6 10*3/uL — ABNORMAL LOW (ref 4.0–10.5)
nRBC: 0 % (ref 0.0–0.2)

## 2022-12-21 LAB — MAGNESIUM: Magnesium: 2.2 mg/dL (ref 1.7–2.4)

## 2022-12-21 MED ORDER — FLUOROURACIL CHEMO INJECTION 500 MG/10ML
320.0000 mg/m2 | Freq: Once | INTRAVENOUS | Status: AC
  Administered 2022-12-21: 500 mg via INTRAVENOUS
  Filled 2022-12-21: qty 10

## 2022-12-21 MED ORDER — SODIUM CHLORIDE 0.9 % IV SOLN: Freq: Once | INTRAVENOUS | Status: AC

## 2022-12-21 MED ORDER — SODIUM CHLORIDE 0.9 % IV SOLN
1920.0000 mg/m2 | INTRAVENOUS | Status: DC
  Administered 2022-12-21: 3500 mg via INTRAVENOUS
  Filled 2022-12-21: qty 70

## 2022-12-21 MED ORDER — SODIUM CHLORIDE 0.9 % IV SOLN
320.0000 mg/m2 | Freq: Once | INTRAVENOUS | Status: AC
  Administered 2022-12-21: 540 mg via INTRAVENOUS
  Filled 2022-12-21: qty 27

## 2022-12-21 MED ORDER — SODIUM CHLORIDE 0.9% FLUSH
10.0000 mL | Freq: Once | INTRAVENOUS | Status: AC
  Administered 2022-12-21: 10 mL

## 2022-12-21 MED ORDER — FAMOTIDINE IN NACL 20-0.9 MG/50ML-% IV SOLN: 20.0000 mg | Freq: Once | INTRAVENOUS | Status: DC

## 2022-12-21 MED ORDER — SODIUM CHLORIDE 0.9 % IV SOLN
20.0000 mg | Freq: Once | INTRAVENOUS | Status: AC
  Administered 2022-12-21: 20 mg via INTRAVENOUS
  Filled 2022-12-21: qty 20

## 2022-12-21 MED ORDER — PALONOSETRON HCL INJECTION 0.25 MG/5ML
0.2500 mg | Freq: Once | INTRAVENOUS | Status: AC
  Administered 2022-12-21: 0.25 mg via INTRAVENOUS
  Filled 2022-12-21: qty 5

## 2022-12-21 MED ORDER — SODIUM CHLORIDE 0.9 % IV SOLN
5.0000 mg/kg | Freq: Once | INTRAVENOUS | Status: AC
  Administered 2022-12-21: 300 mg via INTRAVENOUS
  Filled 2022-12-21: qty 12

## 2022-12-21 NOTE — Progress Notes (Signed)
Patient in for port flush labs and treatment. Port lushed and labs drawn and accessed for treatment.

## 2022-12-21 NOTE — Progress Notes (Signed)
Patient presents today for MVASI/Leucovorin with 5FU pump start per providers order.  Vital signs within parameters for treatment. Total bili 3.0 and platelets 70, MD notified.  Okay to proceed with treatment per Dr. Anders Simmonds.  Treatment given today per MD orders.  Tolerated infusion without adverse affects.  Vital signs stable.  5FU pump start verified RUN on the screen with patient.  No complaints at this time.  Discharge from clinic ambulatory in stable condition.  Alert and oriented X 3.  Follow up with [facility] as scheduled.

## 2022-12-21 NOTE — Progress Notes (Signed)
Message received from Dr. Anders Simmonds to proceed with treatment by secure chat.

## 2022-12-21 NOTE — Patient Instructions (Signed)
[facility]  Discharge Instructions: Thank you for choosing [facility] to provide your oncology and hematology care.  If you have a lab appointment with the Cancer Center - please note that after April 8th, 2024, all labs will be drawn in the cancer center.  You do not have to check in or register with the main entrance as you have in the past but will complete your check-in in the cancer center.  Wear comfortable clothing and clothing appropriate for easy access to any Portacath or PICC line.   We strive to give you quality time with your provider. You may need to reschedule your appointment if you arrive late (15 or more minutes).  Arriving late affects you and other patients whose appointments are after yours.  Also, if you miss three or more appointments without notifying the office, you may be dismissed from the clinic at the provider's discretion.      For prescription refill requests, have your pharmacy contact our office and allow 72 hours for refills to be completed.    Today you received the following chemotherapy and/or immunotherapy agents MVASI Leucovorin 5FU      To help prevent nausea and vomiting after your treatment, we encourage you to take your nausea medication as directed.  BELOW ARE SYMPTOMS THAT SHOULD BE REPORTED IMMEDIATELY: *FEVER GREATER THAN 100.4 F (38 C) OR HIGHER *CHILLS OR SWEATING *NAUSEA AND VOMITING THAT IS NOT CONTROLLED WITH YOUR NAUSEA MEDICATION *UNUSUAL SHORTNESS OF BREATH *UNUSUAL BRUISING OR BLEEDING *URINARY PROBLEMS (pain or burning when urinating, or frequent urination) *BOWEL PROBLEMS (unusual diarrhea, constipation, pain near the anus) TENDERNESS IN MOUTH AND THROAT WITH OR WITHOUT PRESENCE OF ULCERS (sore throat, sores in mouth, or a toothache) UNUSUAL RASH, SWELLING OR PAIN  UNUSUAL VAGINAL DISCHARGE OR ITCHING   Items with * indicate a potential emergency and should be followed up as soon as possible or  go to the Emergency Department if any problems should occur.  Please show the CHEMOTHERAPY ALERT CARD or IMMUNOTHERAPY ALERT CARD at check-in to the Emergency Department and triage nurse.  Should you have questions after your visit or need to cancel or reschedule your appointment, please contact [facility] [phone]  and follow the prompts.  Office hours are 8:00 a.m. to 4:30 p.m. Monday - Friday. Please note that voicemails left after 4:00 p.m. may not be returned until the following business day.  We are closed weekends and major holidays. You have access to a nurse at all times for urgent questions. Please call the main number to the clinic [phone] and follow the prompts.  For any non-urgent questions, you may also contact your provider using MyChart. We now offer e-Visits for anyone 18 and older to request care online for non-urgent symptoms. For details visit [email or web address].   Also download the MyChart app! Go to the app store, search "MyChart", open the app, select [place], and log in with your MyChart username and password.

## 2022-12-23 ENCOUNTER — Inpatient Hospital Stay: Payer: 59

## 2022-12-23 VITALS — BP 109/76 | HR 87 | Temp 98.1°F | Resp 18

## 2022-12-23 DIAGNOSIS — Z5112 Encounter for antineoplastic immunotherapy: Secondary | ICD-10-CM | POA: Diagnosis not present

## 2022-12-23 DIAGNOSIS — C20 Malignant neoplasm of rectum: Secondary | ICD-10-CM

## 2022-12-23 MED ORDER — HEPARIN SOD (PORK) LOCK FLUSH 100 UNIT/ML IV SOLN
500.0000 [IU] | Freq: Once | INTRAVENOUS | Status: AC | PRN
  Administered 2022-12-23: 500 [IU]

## 2022-12-23 MED ORDER — SODIUM CHLORIDE 0.9% FLUSH
10.0000 mL | INTRAVENOUS | Status: DC | PRN
  Administered 2022-12-23: 10 mL

## 2022-12-23 NOTE — Progress Notes (Signed)
Patient presents today for 5FU chemotherapy pump disconnection per provider's order. Vital signs stable and pt voiced no new complaints at this time. Port flushed easily with good blood return with 10 mL normal saline and 5 mL of heparin. Needle removed intact and no bruising or swelling noted at the site.  Discharged from clinic ambulatory in stable condition. Alert and oriented x 3. F/U with [facility] as scheduled.

## 2022-12-29 ENCOUNTER — Other Ambulatory Visit: Payer: Self-pay

## 2023-01-04 ENCOUNTER — Inpatient Hospital Stay (HOSPITAL_BASED_OUTPATIENT_CLINIC_OR_DEPARTMENT_OTHER): Payer: 59 | Admitting: Hematology

## 2023-01-04 ENCOUNTER — Inpatient Hospital Stay: Payer: 59 | Attending: Hematology

## 2023-01-04 ENCOUNTER — Inpatient Hospital Stay: Payer: 59

## 2023-01-04 VITALS — BP 107/72 | HR 84 | Temp 97.8°F | Resp 18

## 2023-01-04 DIAGNOSIS — Z9221 Personal history of antineoplastic chemotherapy: Secondary | ICD-10-CM | POA: Diagnosis not present

## 2023-01-04 DIAGNOSIS — C20 Malignant neoplasm of rectum: Secondary | ICD-10-CM | POA: Insufficient documentation

## 2023-01-04 DIAGNOSIS — D696 Thrombocytopenia, unspecified: Secondary | ICD-10-CM | POA: Diagnosis not present

## 2023-01-04 DIAGNOSIS — Z881 Allergy status to other antibiotic agents status: Secondary | ICD-10-CM | POA: Diagnosis not present

## 2023-01-04 DIAGNOSIS — Z5111 Encounter for antineoplastic chemotherapy: Secondary | ICD-10-CM | POA: Insufficient documentation

## 2023-01-04 DIAGNOSIS — Z5112 Encounter for antineoplastic immunotherapy: Secondary | ICD-10-CM | POA: Insufficient documentation

## 2023-01-04 DIAGNOSIS — C787 Secondary malignant neoplasm of liver and intrahepatic bile duct: Secondary | ICD-10-CM | POA: Diagnosis not present

## 2023-01-04 DIAGNOSIS — Z79631 Long term (current) use of antimetabolite agent: Secondary | ICD-10-CM | POA: Diagnosis not present

## 2023-01-04 DIAGNOSIS — D72819 Decreased white blood cell count, unspecified: Secondary | ICD-10-CM | POA: Diagnosis not present

## 2023-01-04 DIAGNOSIS — C7931 Secondary malignant neoplasm of brain: Secondary | ICD-10-CM | POA: Diagnosis not present

## 2023-01-04 DIAGNOSIS — Z9049 Acquired absence of other specified parts of digestive tract: Secondary | ICD-10-CM | POA: Insufficient documentation

## 2023-01-04 DIAGNOSIS — Z85850 Personal history of malignant neoplasm of thyroid: Secondary | ICD-10-CM | POA: Insufficient documentation

## 2023-01-04 DIAGNOSIS — Z87891 Personal history of nicotine dependence: Secondary | ICD-10-CM | POA: Insufficient documentation

## 2023-01-04 DIAGNOSIS — Z885 Allergy status to narcotic agent status: Secondary | ICD-10-CM | POA: Diagnosis not present

## 2023-01-04 DIAGNOSIS — R918 Other nonspecific abnormal finding of lung field: Secondary | ICD-10-CM | POA: Diagnosis not present

## 2023-01-04 DIAGNOSIS — Z8249 Family history of ischemic heart disease and other diseases of the circulatory system: Secondary | ICD-10-CM | POA: Insufficient documentation

## 2023-01-04 DIAGNOSIS — Z79899 Other long term (current) drug therapy: Secondary | ICD-10-CM | POA: Insufficient documentation

## 2023-01-04 DIAGNOSIS — R161 Splenomegaly, not elsewhere classified: Secondary | ICD-10-CM | POA: Insufficient documentation

## 2023-01-04 DIAGNOSIS — R197 Diarrhea, unspecified: Secondary | ICD-10-CM | POA: Diagnosis not present

## 2023-01-04 DIAGNOSIS — Z880 Allergy status to penicillin: Secondary | ICD-10-CM | POA: Insufficient documentation

## 2023-01-04 DIAGNOSIS — Z801 Family history of malignant neoplasm of trachea, bronchus and lung: Secondary | ICD-10-CM | POA: Insufficient documentation

## 2023-01-04 DIAGNOSIS — E785 Hyperlipidemia, unspecified: Secondary | ICD-10-CM | POA: Diagnosis not present

## 2023-01-04 DIAGNOSIS — E063 Autoimmune thyroiditis: Secondary | ICD-10-CM | POA: Diagnosis not present

## 2023-01-04 DIAGNOSIS — K5900 Constipation, unspecified: Secondary | ICD-10-CM | POA: Insufficient documentation

## 2023-01-04 LAB — COMPREHENSIVE METABOLIC PANEL
ALT: 21 U/L (ref 0–44)
AST: 32 U/L (ref 15–41)
Albumin: 3.8 g/dL (ref 3.5–5.0)
Alkaline Phosphatase: 76 U/L (ref 38–126)
Anion gap: 10 (ref 5–15)
BUN: 14 mg/dL (ref 6–20)
CO2: 21 mmol/L — ABNORMAL LOW (ref 22–32)
Calcium: 8.5 mg/dL — ABNORMAL LOW (ref 8.9–10.3)
Chloride: 105 mmol/L (ref 98–111)
Creatinine, Ser: 0.89 mg/dL (ref 0.44–1.00)
GFR, Estimated: >60 mL/min (ref >60)
Glucose, Bld: 87 mg/dL (ref 70–99)
Potassium: 3.4 mmol/L — ABNORMAL LOW (ref 3.5–5.1)
Sodium: 136 mmol/L (ref 135–145)
Total Bilirubin: 2.4 mg/dL — ABNORMAL HIGH (ref 0.3–1.2)
Total Protein: 6.8 g/dL (ref 6.5–8.1)

## 2023-01-04 LAB — CBC WITH DIFFERENTIAL/PLATELET
Abs Immature Granulocytes: 0 10*3/uL (ref 0.00–0.07)
Basophils Absolute: 0 10*3/uL (ref 0.0–0.1)
Basophils Relative: 0 %
Eosinophils Absolute: 0 10*3/uL (ref 0.0–0.5)
Eosinophils Relative: 1 %
HCT: 34.5 % — ABNORMAL LOW (ref 36.0–46.0)
Hemoglobin: 11.5 g/dL — ABNORMAL LOW (ref 12.0–15.0)
Immature Granulocytes: 0 %
Lymphocytes Relative: 19 %
Lymphs Abs: 0.4 10*3/uL — ABNORMAL LOW (ref 0.7–4.0)
MCH: 35 pg — ABNORMAL HIGH (ref 26.0–34.0)
MCHC: 33.3 g/dL (ref 30.0–36.0)
MCV: 104.9 fL — ABNORMAL HIGH (ref 80.0–100.0)
Monocytes Absolute: 0.1 10*3/uL (ref 0.1–1.0)
Monocytes Relative: 7 %
Neutro Abs: 1.4 10*3/uL — ABNORMAL LOW (ref 1.7–7.7)
Neutrophils Relative %: 73 %
Platelets: 59 10*3/uL — ABNORMAL LOW (ref 150–400)
RBC: 3.29 MIL/uL — ABNORMAL LOW (ref 3.87–5.11)
RDW: 14.7 % (ref 11.5–15.5)
WBC: 2 10*3/uL — ABNORMAL LOW (ref 4.0–10.5)
nRBC: 0 % (ref 0.0–0.2)

## 2023-01-04 LAB — MAGNESIUM: Magnesium: 2.3 mg/dL (ref 1.7–2.4)

## 2023-01-04 MED ORDER — FAMOTIDINE IN NACL 20-0.9 MG/50ML-% IV SOLN
20.0000 mg | Freq: Once | INTRAVENOUS | Status: AC
  Administered 2023-01-04: 20 mg via INTRAVENOUS
  Filled 2023-01-04: qty 50

## 2023-01-04 MED ORDER — SODIUM CHLORIDE 0.9 % IV SOLN
320.0000 mg/m2 | Freq: Once | INTRAVENOUS | Status: AC
  Administered 2023-01-04: 540 mg via INTRAVENOUS
  Filled 2023-01-04: qty 27

## 2023-01-04 MED ORDER — SODIUM CHLORIDE 0.9 % IV SOLN
1920.0000 mg/m2 | INTRAVENOUS | Status: AC
  Administered 2023-01-04: 3500 mg via INTRAVENOUS
  Filled 2023-01-04: qty 70

## 2023-01-04 MED ORDER — SODIUM CHLORIDE 0.9 % IV SOLN: Freq: Once | INTRAVENOUS | Status: AC

## 2023-01-04 MED ORDER — FLUOROURACIL CHEMO INJECTION 500 MG/10ML
320.0000 mg/m2 | Freq: Once | INTRAVENOUS | Status: AC
  Administered 2023-01-04: 500 mg via INTRAVENOUS
  Filled 2023-01-04: qty 10

## 2023-01-04 MED ORDER — SODIUM CHLORIDE 0.9 % IV SOLN
5.0000 mg/kg | Freq: Once | INTRAVENOUS | Status: AC
  Administered 2023-01-04: 300 mg via INTRAVENOUS
  Filled 2023-01-04: qty 12

## 2023-01-04 MED ORDER — SODIUM CHLORIDE 0.9% FLUSH
10.0000 mL | Freq: Once | INTRAVENOUS | Status: AC
  Administered 2023-01-04: 10 mL via INTRAVENOUS

## 2023-01-04 MED ORDER — SODIUM CHLORIDE 0.9 % IV SOLN
10.0000 mg | Freq: Once | INTRAVENOUS | Status: AC
  Administered 2023-01-04: 10 mg via INTRAVENOUS
  Filled 2023-01-04: qty 10

## 2023-01-04 MED ORDER — PALONOSETRON HCL INJECTION 0.25 MG/5ML
0.2500 mg | Freq: Once | INTRAVENOUS | Status: AC
  Administered 2023-01-04: 0.25 mg via INTRAVENOUS
  Filled 2023-01-04: qty 5

## 2023-01-04 NOTE — Patient Instructions (Signed)
[facility]  Discharge Instructions: Thank you for choosing [facility] to provide your oncology and hematology care.  If you have a lab appointment with the Cancer Center - please note that after April 8th, 2024, all labs will be drawn in the cancer center.  You do not have to check in or register with the main entrance as you have in the past but will complete your check-in in the cancer center.  Wear comfortable clothing and clothing appropriate for easy access to any Portacath or PICC line.   We strive to give you quality time with your provider. You may need to reschedule your appointment if you arrive late (15 or more minutes).  Arriving late affects you and other patients whose appointments are after yours.  Also, if you miss three or more appointments without notifying the office, you may be dismissed from the clinic at the provider's discretion.      For prescription refill requests, have your pharmacy contact our office and allow 72 hours for refills to be completed.    Today you received the following chemotherapy and/or immunotherapy agents leucovorin, MVASI, adrucil.   To help prevent nausea and vomiting after your treatment, we encourage you to take your nausea medication as directed.  BELOW ARE SYMPTOMS THAT SHOULD BE REPORTED IMMEDIATELY: *FEVER GREATER THAN 100.4 F (38 C) OR HIGHER *CHILLS OR SWEATING *NAUSEA AND VOMITING THAT IS NOT CONTROLLED WITH YOUR NAUSEA MEDICATION *UNUSUAL SHORTNESS OF BREATH *UNUSUAL BRUISING OR BLEEDING *URINARY PROBLEMS (pain or burning when urinating, or frequent urination) *BOWEL PROBLEMS (unusual diarrhea, constipation, pain near the anus) TENDERNESS IN MOUTH AND THROAT WITH OR WITHOUT PRESENCE OF ULCERS (sore throat, sores in mouth, or a toothache) UNUSUAL RASH, SWELLING OR PAIN  UNUSUAL VAGINAL DISCHARGE OR ITCHING   Items with * indicate a potential emergency and should be followed up as soon as possible  or go to the Emergency Department if any problems should occur.  Please show the CHEMOTHERAPY ALERT CARD or IMMUNOTHERAPY ALERT CARD at check-in to the Emergency Department and triage nurse.  Should you have questions after your visit or need to cancel or reschedule your appointment, please contact [facility] AT [facility] [phone]  and follow the prompts.  Office hours are 8:00 a.m. to 4:30 p.m. Monday - Friday. Please note that voicemails left after 4:00 p.m. may not be returned until the following business day.  We are closed weekends and major holidays. You have access to a nurse at all times for urgent questions. Please call the main number to the clinic [phone] and follow the prompts.  For any non-urgent questions, you may also contact your provider using MyChart. We now offer e-Visits for anyone 61 and older to request care online for non-urgent symptoms. For details visit [email or web address].   Also download the MyChart app! Go to the app store, search "MyChart", open the app, select [place], and log in with your MyChart username and password.

## 2023-01-04 NOTE — Progress Notes (Signed)
Labs reviewed with MD today at office visit. Platelets and ANC noted by MD. Rip Harbour to proceed per MD.   Treatment given per orders. Patient tolerated it well without problems. Vitals stable and discharged home from clinic ambulatory. Follow up as scheduled.

## 2023-01-04 NOTE — Progress Notes (Signed)
[facility] [address], [place] [address]    Clinic Day:  01/04/2023  Referring physician: Babs Sciara, MD  Patient Care Team: Babs Sciara, MD as PCP - General (Family Medicine) [name], MD as Medical Oncologist (Medical Oncology)   ASSESSMENT & PLAN:   Assessment: 1.  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  Liver biopsy on 03/09/2018 consistent with adenocarcinoma. 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 -7 cycles of FOLFOX with vectibix completed on 06/27/2018. -XRT with Xeloda from 07/30/2018 through 09/06/2018. -Right liver lesion microwave ablation on 10/15/2018 at [facility]. -Low anterior resection and diverting ileostomy on 12/07/2018, pathology [id number], 0/6 lymph nodes positive, margins negative. -Loop ileostomy reversal on 04/17/2019. - Microwave ablation of the right hepatic lobe lesion by Dr. Selena Batten around 08/15/2019. -SBRT to the left lower lobe lung lesion and left liver lobe lesion on 12/13/2019 at [facility]. - Right upper lobe lesion SBRT from 08/06/2020 through 08/13/2020. - 3 left lung lesions and subcarinal lymph node IMRT from 09/21/2021 through 10/05/2021. - Liver biopsy (06/09/2022): Metastatic moderately differentiated colonic adenocarcinoma. - NGS: KRAS/NRAS/BRAF wild-type, MSI-stable, HER2-0, negative for other targetable mutations - Cycle 1 of FOLFOX and bevacizumab on 07/06/2022, oxaliplatin discontinued from 11/09/2022 (cycle 8), 5-FU and bevacizumab maintenance changed to every 3 weeks on 01/04/2023 - XRT to the right neck from 07/14/2022 through 08/03/2022    Plan: 1.  Stage IV rectal adenocarcinoma, BRAF/RAS wild-type, MSI-stable: - PET scan on 11/03/2022 showed very good response. - She has received 2 cycles of 5-FU and bevacizumab maintenance on 12/07/2022 12/21/2022. - After each treatment she felt tired for 2 to 3 days.  She had  constipation for couple of days which progressed to diarrhea for 2 days and a combination of both for another 2 days.  Denies any bleeding issues. - Reviewed labs today: Total bilirubin 2.4 and rest of LFTs normal.  CBC with leukopenia and thrombocytopenia but adequate for treatment today. - Last CEA has normalized at 4.7. - Will change her treatment to every 3 weeks for better tolerability.  RTC 6 weeks for follow-up.   2.  Leukopenia and thrombocytopenia: - Intermittent leukopenia and thrombocytopenia exacerbated by splenomegaly.  Will closely monitor.   3.  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 - She will have another MRI in 3 months.    Orders Placed This Encounter  Procedures   Magnesium    Standing Status:   Future    Standing Expiration Date:   03/07/2024   CBC with Differential    Standing Status:   Future    Standing Expiration Date:   03/07/2024   Comprehensive metabolic panel    Standing Status:   Future    Standing Expiration Date:   03/07/2024   Urinalysis, dipstick only    Standing Status:   Future    Standing Expiration Date:   03/07/2024   CEA    Standing Status:   Future    Standing Expiration Date:   01/25/2024   CEA    Standing Status:   Future    Standing Expiration Date:   03/07/2024   Magnesium    Standing Status:   Future    Standing Expiration Date:   03/28/2024   CBC with Differential    Standing Status:   Future    Standing Expiration Date:   03/28/2024  Comprehensive metabolic panel    Standing Status:   Future    Standing Expiration Date:   03/28/2024   Urinalysis, dipstick only    Standing Status:   Future    Standing Expiration Date:   03/28/2024      I,Katie Daubenspeck,acting as a scribe for [name], MD.,have documented all relevant documentation on the behalf of [name], MD,as directed by  [name], MD while in the presence of [name], MD.   I, [name] MD, have reviewed the above documentation for accuracy and completeness, and I agree with the above.   [name], MD   10/9/20246:24 PM  CHIEF COMPLAINT:   Diagnosis: metastatic rectal cancer to liver    Cancer Staging  Malignant neoplasm of rectum [facility]) Staging form: Colon and Rectum, AJCC 8th Edition - Clinical stage from 03/13/2018: Stage IVA (cT3, cN1b, cM1a) - Signed by [name], MD on 03/13/2018    Prior Therapy: 1. FOLFOX & vectibix x 7 cycles from 03/14/2018 to 06/27/2018. 2. XRT with Xeloda from 07/30/2018 to 09/06/2018. 3. Right liver lesion microwave ablation on 10/15/2018. 4. Low anterior resection and diverting ileostomy on 12/07/2018. 5. Right hepatic lobe microwave ablation on 08/15/19 6. SBRT to LLL and liver 11/15/2019 -12/13/2019. 7. SBRT to RUL 08/06/20 - 08/13/20 8. IMRT to 3 left lung lesions and subcarinal LN 09/21/21 - 10/05/21 9. XRT to C6 07/14/22 - 08/03/22  Current Therapy:  FOLFOX and bevacizumab    HISTORY OF PRESENT ILLNESS:   Oncology History  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HCC)  02/26/2018 Initial Diagnosis   Rectal cancer (HCC)   03/13/2018 Cancer Staging   Staging form: Colon and Rectum, AJCC 8th Edition - Clinical stage from 03/13/2018: Stage IVA (cT3, cN1b, cM1a) - Signed by [name], MD on 03/13/2018   03/14/2018 - 06/29/2018 Chemotherapy   The patient had palonosetron (ALOXI) injection 0.25 mg, 0.25 mg, Intravenous,  Once, 7 of 8 cycles Administration: 0.25 mg (03/14/2018), 0.25 mg (03/27/2018), 0.25 mg (04/18/2018), 0.25 mg (05/02/2018), 0.25 mg (05/16/2018), 0.25 mg (06/05/2018), 0.25 mg (06/27/2018) leucovorin 800 mg in dextrose 5 % 250 mL infusion, 772 mg, Intravenous,  Once, 7 of 8 cycles Administration: 800 mg (03/14/2018), 800 mg (03/27/2018), 800 mg (05/02/2018), 800 mg (05/16/2018), 700 mg (04/18/2018), 800 mg (06/05/2018), 800 mg (06/27/2018) oxaliplatin (ELOXATIN) 165 mg  in dextrose 5 % 500 mL chemo infusion, 85 mg/m2 = 165 mg, Intravenous,  Once, 6 of 7 cycles Dose modification: 68 mg/m2 (80 % of original dose 85 mg/m2, Cycle 4, Reason: Provider Judgment) Administration: 165 mg (03/14/2018), 165 mg (03/27/2018), 130 mg (05/02/2018), 130 mg (05/16/2018), 130 mg (06/05/2018), 130 mg (06/27/2018) panitumumab (VECTIBIX) 500 mg in sodium chloride 0.9 % 100 mL chemo infusion, 480 mg, Intravenous,  Once, 4 of 5 cycles Administration: 500 mg (04/18/2018), 480 mg (05/02/2018), 480 mg (05/16/2018), 480 mg (06/05/2018) fluorouracil (ADRUCIL) chemo injection 750 mg, 400 mg/m2 = 750 mg (100 % of original dose 400 mg/m2), Intravenous,  Once, 6 of 7 cycles Dose modification: 400 mg/m2 (original dose 400 mg/m2, Cycle 1), 400 mg/m2 (original dose 400 mg/m2, Cycle 3) Administration: 750 mg (03/14/2018), 750 mg (04/18/2018), 750 mg (05/02/2018), 750 mg (05/16/2018), 750 mg (06/05/2018), 750 mg (06/27/2018) fosaprepitant (EMEND) 150 mg, dexamethasone (DECADRON) 12 mg in sodium chloride 0.9 % 145 mL IVPB, , Intravenous,  Once, 4 of 5 cycles Administration:  (05/02/2018),  (05/16/2018),  (06/05/2018),  (06/27/2018) fluorouracil (ADRUCIL) 4,650 mg in sodium chloride 0.9 % 57 mL chemo  infusion, 2,400 mg/m2 = 4,650 mg, Intravenous, 1 Day/Dose, 7 of 8 cycles Administration: 4,650 mg (03/14/2018), 4,650 mg (03/27/2018), 4,650 mg (04/18/2018), 4,650 mg (05/02/2018), 4,650 mg (05/16/2018), 4,650 mg (06/05/2018), 4,650 mg (06/27/2018)  for chemotherapy treatment.    07/06/2022 -  Chemotherapy   Patient is on Treatment Plan : COLORECTAL FOLFOX + Bevacizumab q14d        INTERVAL HISTORY:   Samantha Reynolds is a 57 y.o. female presenting to clinic today for follow up of metastatic rectal cancer to liver. She was last seen by me on 12/07/22.  Today, she states that she is doing well overall. Her appetite level is at 85%. Her energy level is at 80%.  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   Worse in her 91s when she was ill   H/O Hashimoto thyroiditis    History of chemotherapy    History of radiation therapy    History of thyroid cancer no recurrence   2013--  s/p  left lobe thyroidectomy--  follicular varient papillary / lymphocyctic thyroiditis   Hyperlipidemia 11/09/2015   Hypothyroidism    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HCC) 02/26/2018    Surgical History: Past Surgical History:  Procedure Laterality Date   BIOPSY  02/19/2018   Procedure: BIOPSY;  Surgeon: Malissa Hippo, MD;  [facility];  Service: Endoscopy;;  rectum   COLONOSCOPY N/A 08/20/2015   Procedure: COLONOSCOPY;  Surgeon: Malissa Hippo, MD;  [facility]: [facility];  Service: Endoscopy;  Laterality: N/A;  730   COLONOSCOPY WITH PROPOFOL N/A 07/21/2021   Procedure: COLONOSCOPY WITH PROPOFOL;  Surgeon: Malissa Hippo, MD;  [facility]: [facility];  Service: Endoscopy;  Laterality: N/A;  10:10   D & C HYSTEROOSCOPY W/ THERMACHOICE ENDOMETRIAL ABLATION  08-13-2004   DIVERTING ILEOSTOMY N/A 12/07/2018   Procedure: DIVERTING LOOP ILEOSTOMY;  Surgeon: Romie Levee, MD;  Location: [facility];  Service: General;  Laterality: N/A;   FLEXIBLE SIGMOIDOSCOPY N/A 02/19/2018   Procedure: FLEXIBLE SIGMOIDOSCOPY;  Surgeon: Malissa Hippo, MD;  [facility]: [facility];  Service: Endoscopy;  Laterality: N/A;   HYSTEROSCOPY WITH D & C N/A 04/30/2015   Procedure: DILATATION AND CURETTAGE / INTENDED HYSTEROSCOPY;  Surgeon: Marcelle Overlie, MD;  Location: [facility];  Service: Gynecology;  Laterality: N/A;   ILEOSTOMY CLOSURE N/A 04/17/2019   Procedure: LOOP ILEOSTOMY REVERSAL;  Surgeon: Romie Levee, MD;  Location: [facility];  Service: General;  Laterality: N/A;   IR US GUIDE BX ASP/DRAIN  03/09/2018   LAPAROSCOPIC  CHOLECYSTECTOMY  04/1996   LAPAROSCOPY N/A 12/11/2018   Procedure: LAPAROSCOPY  ILEOSTOMY REVISION AND ABDOMINAL WASHOUT;  Surgeon: Romie Levee, MD;  Location: [facility];  Service: General;  Laterality: N/A;   Liver Microwave   10/17/2018   POLYPECTOMY  08/20/2015   Procedure: POLYPECTOMY;  Surgeon: Malissa Hippo, MD;  [facility];  Service: Endoscopy;;  Splenic flexure polypectomy   POLYPECTOMY  02/19/2018   Procedure: POLYPECTOMY;  Surgeon: Malissa Hippo, MD;  [facility];  Service: Endoscopy;;  rectum   PORTACATH PLACEMENT N/A 03/14/2018   Procedure: INSERTION PORT-A-CATH;  Surgeon: Franky Macho, MD;  Location: [facility];  Service: General;  Laterality: N/A;  REDUCTION INCARCERATED UTERUS  06-20-2000   intrauterine preg. 13 wks /  urinary retention   THYROID LOBECTOMY  11/24/2011   Procedure: THYROID LOBECTOMY;  Surgeon: Velora Heckler, MD;  Location: [facility];  Service: General;  Laterality: Left;  Left Thyroid Lobectomy   TUBAL LIGATION  2002   XI ROBOTIC ASSISTED LOWER ANTERIOR RESECTION N/A 12/07/2018   Procedure: XI ROBOTIC ASSISTED LOWER ANTERIOR RESECTION, SPENIC FLEXURE IMMOBILIZATION, COLOANAL ANASTOMOSIS, RIGID PROCTOSCOPY;  Surgeon: Romie Levee, MD;  Location: [facility];  Service: General;  Laterality: N/A;    Social History: Social History   Socioeconomic History   Marital status: Married    Spouse name: Not on file   Number of children: 4   Years of education: Not on file   Highest education level: Not on file  Occupational History    Comment: Systems developer at [employer]  Tobacco Use   Smoking status: Former    Current packs/day: 0.00    Average packs/day: 0.3 packs/day for 10.0 years (2.5 ttl pk-yrs)    Types: Cigarettes    Start date: 10/30/1981    Quit date: 10/31/1991    Years since quitting: 31.2   Smokeless tobacco: Never  Vaping Use   Vaping status: Never Used  Substance and Sexual Activity   Alcohol use: No   Drug use: No   Sexual  activity: Not Currently  Other Topics Concern   Not on file  Social History Narrative   Lives with husband Loraine Leriche and 63 year old son Almeta Monas   Husband is Technical brewer of [employer]   Social Determinants of Health   Financial Resource Strain: Low Risk  (02/26/2018)   Overall Financial Resource Strain (CARDIA)    Difficulty of Paying Living Expenses: Not hard at all  Food Insecurity: No Food Insecurity (12/05/2022)   Hunger Vital Sign    Worried About Running Out of Food in the Last Year: Never true    Ran Out of Food in the Last Year: Never true  Transportation Needs: No Transportation Needs (12/05/2022)   PRAPARE - Administrator, Civil Service (Medical): No    Lack of Transportation (Non-Medical): No  Physical Activity: Inactive (02/26/2018)   Exercise Vital Sign    Days of Exercise per Week: 0 days    Minutes of Exercise per Session: 0 min  Stress: Stress Concern Present (02/26/2018)   [employer] of Occupational Health - Occupational Stress Questionnaire    Feeling of Stress : To some extent  Social Connections: Socially Integrated (02/26/2018)   Social Connection and Isolation Panel [NHANES]    Frequency of Communication with Friends and Family: More than three times a week    Frequency of Social Gatherings with Friends and Family: Twice a week    Attends Religious Services: More than 4 times per year    Active Member of [employer] or Organizations: Yes    Attends Engineer, structural: More than 4 times per year    Marital Status: Married  Catering manager Violence: Not At Risk (12/05/2022)   Humiliation, Afraid, Rape, and Kick questionnaire    Fear of Current or Ex-Partner: No    Emotionally Abused: No    Physically Abused: No    Sexually Abused: No    Family History: Family History  Problem Relation Age of Onset   Hypertension Mother    Hypertension Father    Prostate cancer Father    Cancer Maternal Aunt  spine/back   Cancer Paternal  Grandfather        lung   Healthy Son    Healthy Son    Healthy Son    Healthy Son    Colon cancer Neg Hx     Current Medications:  Current Outpatient Medications:    ALPRAZolam (XANAX) 0.5 MG tablet, Take 1 tablet (0.5 mg total) by mouth 3 (three) times daily as needed for anxiety., Disp: 90 tablet, Rfl: 2   amitriptyline (ELAVIL) 10 MG tablet, Take 1 tablet (10 mg total) by mouth 2 (two) times daily., Disp: 180 tablet, Rfl: 3   ARMOUR THYROID 60 MG tablet, Take 1 tablet (60 mg total) by mouth daily., Disp: 90 tablet, Rfl: 1   ARMOUR THYROID 60 MG tablet, Take 1 tablet (60 mg total) by mouth daily., Disp: 90 tablet, Rfl: 1   ARMOUR THYROID 60 MG tablet, Take 1 tablet (60 mg total) by mouth daily., Disp: 90 tablet, Rfl: 1   loperamide (IMODIUM) 2 MG capsule, Take 1 capsule (2 mg total) by mouth 3 (three) times daily. (Patient taking differently: Take 2 mg by mouth daily.), Disp: 30 capsule, Rfl: 0   pregabalin (LYRICA) 25 MG capsule, Take 1 capsule (25 mg total) by mouth 2 (two) times daily., Disp: 60 capsule, Rfl: 3   prochlorperazine (COMPAZINE) 10 MG tablet, Take 1 tablet (10 mg total) by mouth every 6 (six) hours as needed for nausea or vomiting., Disp: 30 tablet, Rfl: 4   sucralfate (CARAFATE) 1 g tablet, Take 1 tablet (1 g total) by mouth 4 (four) times daily -  with meals and at bedtime. Crush 1 tablet in 1 oz water and drink 5 min before meals for radiation induced esophagitis, Disp: 120 tablet, Rfl: 2   thyroid (ARMOUR THYROID) 15 MG tablet, Take 1 tablet (15 mg total) by mouth daily as needed with 60 mg armour thyroid tablet (75 mg total)., Disp: 30 tablet, Rfl: 3   tiZANidine (ZANAFLEX) 4 MG tablet, Take 1 tablet (4 mg total) by mouth every 6 (six) hours as needed for muscle spasms (take for tightness and pain in the scapular region)., Disp: 56 tablet, Rfl: 2 No current facility-administered medications for this visit.  Facility-Administered Medications Ordered in Other Visits:     clindamycin (CLEOCIN) 900 mg in dextrose 5 % 50 mL IVPB, 900 mg, Intravenous, 60 min Pre-Op **AND** gentamicin (GARAMYCIN) 350 mg in dextrose 5 % 50 mL IVPB, 5 mg/kg, Intravenous, 60 min Pre-Op, Romie Levee, MD   clindamycin (CLEOCIN) 900 mg in dextrose 5 % 50 mL IVPB, 900 mg, Intravenous, 60 min Pre-Op **AND** gentamicin (GARAMYCIN) 5 mg/kg in dextrose 5 % 50 mL IVPB, 5 mg/kg, Intravenous, 60 min Pre-Op, Romie Levee, MD   fluorouracil (ADRUCIL) 3,500 mg in sodium chloride 0.9 % 80 mL chemo infusion, 1,920 mg/m2 (Treatment Plan Recorded), Intravenous, 1 day or 1 dose, [name], MD, Infusion Verify at 01/04/23 1459   sodium chloride 0.9 % 1,000 mL with potassium chloride 20 mEq, magnesium sulfate 2 g infusion, , Intravenous, Once, [name], MD   Allergies: Allergies  Allergen Reactions   Oxaliplatin Other (See Comments)    Patient had hypersensitivity reaction to Oxaliplatin. Patient c/o itching in her ears and eyes. Pt also c/o being hot, flushed, dizzy with redness on pt's face, arms, and legs. Pt c/o tingling in her hands and legs. Pt also had a dry throat with coughing. See progress note from 09/07/22 at 1130. Patient was not able to  complete Oxaliplatin infusion.   Second reaction 10/26/2022. Itching in ears, heart racing, & face flushing. See progress note from 10/26/2022. Able to complete infusion after medications.   Demerol [Meperidine] Itching    All over the body.   Penicillins Hives     childhood, does not remember if it spread all over the body or not. Did it involve swelling of the face/tongue/throat, SOB, or low BP? No Did it involve sudden or severe rash/hives, skin peeling, or any reaction on the inside of your mouth or nose? Yes Did you need to seek medical attention at a hospital or doctor's office? Yes When did it last happen?      childhood allergy If all above answers are "NO", may proceed with cephalosporin use.    Levaquin [Levofloxacin] Other  (See Comments)    Patient has Aortic Aneurysm and is not indicated with this diagnosis   Other Hives and Other (See Comments)    Fresh coconut    Vancomycin Rash    Will need Benadryl prior to administration per IV. "Red man syndrome"    REVIEW OF SYSTEMS:   Review of Systems  Constitutional:  Negative for chills, fatigue and fever.  HENT:   Negative for lump/mass, mouth sores, nosebleeds, sore throat and trouble swallowing.   Eyes:  Negative for eye problems.  Respiratory:  Negative for cough and shortness of breath.   Cardiovascular:  Negative for chest pain, leg swelling and palpitations.  Gastrointestinal:  Positive for constipation and diarrhea. Negative for abdominal pain, nausea and vomiting.  Genitourinary:  Negative for bladder incontinence, difficulty urinating, dysuria, frequency, hematuria and nocturia.   Musculoskeletal:  Negative for arthralgias, back pain, flank pain, myalgias and neck pain.  Skin:  Negative for itching and rash.  Neurological:  Positive for numbness. Negative for dizziness and headaches.  Hematological:  Does not bruise/bleed easily.  Psychiatric/Behavioral:  Positive for depression. Negative for sleep disturbance and suicidal ideas. The patient is nervous/anxious.   All other systems reviewed and are negative.    VITALS:   Last menstrual period 02/16/2015.  Wt Readings from Last 3 Encounters:  01/04/23 134 lb 9.6 oz (61.1 kg)  12/21/22 135 lb 1.6 oz (61.3 kg)  12/07/22 134 lb 14.4 oz (61.2 kg)    There is no height or weight on file to calculate BMI.  Performance status (ECOG): 1 - Symptomatic but completely ambulatory  PHYSICAL EXAM:   Physical Exam Vitals and nursing note reviewed. Exam conducted with a chaperone present.  Constitutional:      Appearance: Normal appearance.  Cardiovascular:     Rate and Rhythm: Normal rate and regular rhythm.     Pulses: Normal pulses.     Heart sounds: Normal heart sounds.  Pulmonary:     Effort:  Pulmonary effort is normal.     Breath sounds: Normal breath sounds.  Abdominal:     Palpations: Abdomen is soft. There is no hepatomegaly, splenomegaly or mass.     Tenderness: There is no abdominal tenderness.  Musculoskeletal:     Right lower leg: No edema.     Left lower leg: No edema.  Lymphadenopathy:     Cervical: No cervical adenopathy.     Right cervical: No superficial, deep or posterior cervical adenopathy.    Left cervical: No superficial, deep or posterior cervical adenopathy.     Upper Body:     Right upper body: No supraclavicular or axillary adenopathy.     Left upper body: No supraclavicular or  axillary adenopathy.  Neurological:     General: No focal deficit present.     Mental Status: She is alert and oriented to person, place, and time.  Psychiatric:        Mood and Affect: Mood normal.        Behavior: Behavior normal.     LABS:      Latest Ref Rng & Units 01/04/2023    9:17 AM 12/21/2022    9:05 AM 12/07/2022    9:43 AM  CBC  WBC 4.0 - 10.5 K/uL 2.0  2.6  2.7   Hemoglobin 12.0 - 15.0 g/dL 16.1  09.6  04.5   Hematocrit 36.0 - 46.0 % 34.5  34.4  34.0   Platelets 150 - 400 K/uL 59  70  59       Latest Ref Rng & Units 01/04/2023    9:17 AM 12/21/2022    9:05 AM 12/07/2022    9:43 AM  CMP  Glucose 70 - 99 mg/dL 87  88  88   BUN 6 - 20 mg/dL 14  12  12    Creatinine 0.44 - 1.00 mg/dL 4.09  8.11  9.14   Sodium 135 - 145 mmol/L 136  136  136   Potassium 3.5 - 5.1 mmol/L 3.4  3.6  3.2   Chloride 98 - 111 mmol/L 105  103  104   CO2 22 - 32 mmol/L 21  22  23    Calcium 8.9 - 10.3 mg/dL 8.5  8.6  8.8   Total Protein 6.5 - 8.1 g/dL 6.8  6.8  7.0   Total Bilirubin 0.3 - 1.2 mg/dL 2.4  3.0  3.6   Alkaline Phos 38 - 126 U/L 76  72  60   AST 15 - 41 U/L 32  25  20   ALT 0 - 44 U/L 21  16  14       Lab Results  Component Value Date   CEA1 4.7 12/07/2022   /  CEA  Date Value Ref Range Status  12/07/2022 4.7 0.0 - 4.7 ng/mL Final    Comment:    (NOTE)                              Nonsmokers          <3.9                             Smokers             <5.6 Roche [employer] Electrochemiluminescence Immunoassay (ECLIA) Values obtained with different assay methods or kits cannot be used interchangeably.  Results cannot be interpreted as absolute evidence of the presence or absence of malignant disease. Performed At: [facility] [address] [place], [place] [id number] Jolene Schimke MD [id number]    No results found for: "PSA1" No results found for: "CAN199" No results found for: "CAN125"  No results found for: "TOTALPROTELP", "ALBUMINELP", "A1GS", "A2GS", "BETS", "BETA2SER", "GAMS", "MSPIKE", "SPEI" Lab Results  Component Value Date   TIBC 333 09/07/2022   TIBC 364 05/11/2022   TIBC 385 11/09/2020   FERRITIN 89 09/07/2022   FERRITIN 61 05/11/2022   FERRITIN 54 11/09/2020   IRONPCTSAT 36 (H) 09/07/2022   IRONPCTSAT 26 05/11/2022   IRONPCTSAT 22 11/09/2020   Lab Results  Component Value Date   LDH 154 01/20/2020     STUDIES:  No results found.

## 2023-01-04 NOTE — Patient Instructions (Signed)
[facility] at [facility] Discharge Instructions   You were seen and examined today by Dr. Ellin Saba.  He reviewed the results of your lab work which are mostly normal/stable. Your platelet count is 59,000 today.   We will proceed with your treatment today.   Return as scheduled.    Thank you for choosing [facility] at [facility] to provide your oncology and hematology care.  To afford each patient quality time with our provider, please arrive at least 15 minutes before your scheduled appointment time.   If you have a lab appointment with the Cancer Center please come in thru the Main Entrance and check in at the main information desk.  You need to re-schedule your appointment should you arrive 10 or more minutes late.  We strive to give you quality time with our providers, and arriving late affects you and other patients whose appointments are after yours.  Also, if you no show three or more times for appointments you may be dismissed from the clinic at the providers discretion.     Again, thank you for choosing [facility].  Our hope is that these requests will decrease the amount of time that you wait before being seen by our physicians.       _____________________________________________________________  Should you have questions after your visit to [facility], please contact our office at [phone] and follow the prompts.  Our office hours are 8:00 a.m. and 4:30 p.m. Monday - Friday.  Please note that voicemails left after 4:00 p.m. may not be returned until the following business day.  We are closed weekends and major holidays.  You do have access to a nurse 24-7, just call the main number to the clinic [phone] and do not press any options, hold on the line and a nurse will answer the phone.    For prescription refill requests, have your pharmacy contact our office and allow 72 hours.    Due to Covid,  you will need to wear a mask upon entering the hospital. If you do not have a mask, a mask will be given to you at the Main Entrance upon arrival. For doctor visits, patients may have 1 support person age 45 or older with them. For treatment visits, patients can not have anyone with them due to social distancing guidelines and our immunocompromised population.

## 2023-01-04 NOTE — Progress Notes (Signed)
Patient has been examined by Dr. Ellin Saba. Vital signs and labs have been reviewed by MD - ANC (1.4), Creatinine, LFTs (bilirubin 2.4), hemoglobin, and platelets (59,000) are within treatment parameters per M.D. - pt may proceed with treatment.  Primary RN and pharmacy notified.

## 2023-01-05 DIAGNOSIS — C787 Secondary malignant neoplasm of liver and intrahepatic bile duct: Secondary | ICD-10-CM | POA: Diagnosis not present

## 2023-01-05 DIAGNOSIS — C20 Malignant neoplasm of rectum: Secondary | ICD-10-CM | POA: Diagnosis not present

## 2023-01-06 ENCOUNTER — Encounter: Payer: Self-pay | Admitting: Hematology

## 2023-01-06 ENCOUNTER — Encounter (HOSPITAL_COMMUNITY): Payer: Self-pay | Admitting: Hematology

## 2023-01-06 ENCOUNTER — Inpatient Hospital Stay: Payer: 59

## 2023-01-06 DIAGNOSIS — C20 Malignant neoplasm of rectum: Secondary | ICD-10-CM

## 2023-01-06 DIAGNOSIS — Z5112 Encounter for antineoplastic immunotherapy: Secondary | ICD-10-CM | POA: Diagnosis not present

## 2023-01-06 MED ORDER — HEPARIN SOD (PORK) LOCK FLUSH 100 UNIT/ML IV SOLN
500.0000 [IU] | Freq: Once | INTRAVENOUS | Status: AC
  Administered 2023-01-06: 500 [IU] via INTRAVENOUS

## 2023-01-06 MED ORDER — SODIUM CHLORIDE 0.9% FLUSH
10.0000 mL | INTRAVENOUS | Status: DC | PRN
  Administered 2023-01-06: 10 mL

## 2023-01-06 NOTE — Progress Notes (Signed)
Patient presents for 5FU chemotherapy pump disconnection per provider's order. Vital signs stable and pt voiced no new complaints at this time. Port flushed easily without diffculty with 10 mL of normal saline and 5 mL of heparin. Good blood return noted and needle removed intact. No bruising or swelling noted at the site.  Discharged from clinic ambulatory in stable condition. Alert and oriented x 3. F/U with [facility] as scheduled.

## 2023-01-12 ENCOUNTER — Ambulatory Visit: Payer: 59 | Admitting: Nurse Practitioner

## 2023-01-12 VITALS — BP 106/76 | HR 86 | Temp 97.5°F | Ht 66.5 in | Wt 134.4 lb

## 2023-01-12 DIAGNOSIS — Z01411 Encounter for gynecological examination (general) (routine) with abnormal findings: Secondary | ICD-10-CM | POA: Diagnosis not present

## 2023-01-12 DIAGNOSIS — E785 Hyperlipidemia, unspecified: Secondary | ICD-10-CM

## 2023-01-12 DIAGNOSIS — Z01419 Encounter for gynecological examination (general) (routine) without abnormal findings: Secondary | ICD-10-CM

## 2023-01-12 DIAGNOSIS — E063 Autoimmune thyroiditis: Secondary | ICD-10-CM | POA: Diagnosis not present

## 2023-01-12 NOTE — Progress Notes (Signed)
Subjective:    Patient ID: Samantha Reynolds, female    DOB: [birth year], 57 y.o.   MRN: [id number]  HPI The patient comes in today for a wellness visit.    A review of their health history was completed.  A review of medications was also completed.  Any needed refills; NO  Eating habits: Fair; diet has been limited due to cancer treatments  Falls/  MVA accidents in past few months: No  Regular exercise: No  Specialist pt sees on regular basis: Oncologist; gets regular PET scans which she has been told will substitute for mammo; last PET August 2024; defers PAP smear; was told by GYN she would not need them anymore; reviewed current ACOG guidelines with patient  Preventative health issues were discussed.   Additional concerns: Dr Gerda Diss had stated he would be taking over for her endocrinologist since he retired; currently on [place] thyroid Defers Pap or breast exam Regular eye exams. Needs dental exam Married to same female sexual partner; no menses (post menopausal with endometrial ablation)  Review of Systems  Constitutional:  Positive for activity change, appetite change and fatigue. Negative for fever.  HENT:  Negative for sore throat and trouble swallowing.   Respiratory:  Negative for cough, chest tightness, shortness of breath and wheezing.   Cardiovascular:  Negative for chest pain.  Gastrointestinal:  Positive for constipation and diarrhea. Negative for abdominal pain, nausea and vomiting.  Genitourinary:  Negative for difficulty urinating, dysuria, enuresis, frequency, genital sores, pelvic pain, urgency, vaginal bleeding and vaginal discharge.      01/12/2023   10:54 AM  Depression screen PHQ 2/9  Decreased Interest 0  Down, Depressed, Hopeless 1  PHQ - 2 Score 1  Altered sleeping 0  Tired, decreased energy 2  Change in appetite 2  Feeling bad or failure about yourself  0  Trouble concentrating 0  Moving slowly or fidgety/restless 0  Suicidal thoughts 0   PHQ-9 Score 5  Difficult doing work/chores Not difficult at all      01/12/2023   10:55 AM 03/31/2022    8:14 AM  GAD 7 : Generalized Anxiety Score  Nervous, Anxious, on Edge 0 1  Control/stop worrying 0 1  Worry too much - different things 0 1  Trouble relaxing 0 0  Restless 0 0  Easily annoyed or irritable 0 0  Afraid - awful might happen 0 0  Total GAD 7 Score 0 3  Anxiety Difficulty Not difficult at all Not difficult at all    Past Surgical History:  Procedure Laterality Date   BIOPSY  02/19/2018   Procedure: BIOPSY;  Surgeon: Malissa Hippo, MD;  [facility];  Service: Endoscopy;;  rectum   COLONOSCOPY N/A 08/20/2015   Procedure: COLONOSCOPY;  Surgeon: Malissa Hippo, MD;  [facility];  Service: Endoscopy;  Laterality: N/A;  730   COLONOSCOPY WITH PROPOFOL N/A 07/21/2021   Procedure: COLONOSCOPY WITH PROPOFOL;  Surgeon: Malissa Hippo, MD;  [facility];  Service: Endoscopy;  Laterality: N/A;  10:10   D & C HYSTEROOSCOPY W/ THERMACHOICE ENDOMETRIAL ABLATION  08-13-2004   DIVERTING ILEOSTOMY N/A 12/07/2018   Procedure: DIVERTING LOOP ILEOSTOMY;  Surgeon: Romie Levee, MD;  Location: [facility];  Service: General;  Laterality: N/A;   FLEXIBLE SIGMOIDOSCOPY N/A 02/19/2018   Procedure: FLEXIBLE SIGMOIDOSCOPY;  Surgeon: Malissa Hippo, MD;  [facility];  Service: Endoscopy;  Laterality: N/A;   HYSTEROSCOPY WITH D & C  N/A 04/30/2015   Procedure: DILATATION AND CURETTAGE / INTENDED HYSTEROSCOPY;  Surgeon: Marcelle Overlie, MD;  Location: [facility];  Service: Gynecology;  Laterality: N/A;   ILEOSTOMY CLOSURE N/A 04/17/2019   Procedure: LOOP ILEOSTOMY REVERSAL;  Surgeon: Romie Levee, MD;  Location: [facility];  Service: General;  Laterality: N/A;   IR US GUIDE BX ASP/DRAIN  03/09/2018   LAPAROSCOPIC CHOLECYSTECTOMY  04/1996   LAPAROSCOPY N/A 12/11/2018   Procedure: LAPAROSCOPY  ILEOSTOMY REVISION AND ABDOMINAL WASHOUT;   Surgeon: Romie Levee, MD;  Location: [facility];  Service: General;  Laterality: N/A;   Liver Microwave   10/17/2018   POLYPECTOMY  08/20/2015   Procedure: POLYPECTOMY;  Surgeon: Malissa Hippo, MD;  [facility];  Service: Endoscopy;;  Splenic flexure polypectomy   POLYPECTOMY  02/19/2018   Procedure: POLYPECTOMY;  Surgeon: Malissa Hippo, MD;  [facility];  Service: Endoscopy;;  rectum   PORTACATH PLACEMENT N/A 03/14/2018   Procedure: INSERTION PORT-A-CATH;  Surgeon: Franky Macho, MD;  Location: [facility];  Service: General;  Laterality: N/A;   REDUCTION INCARCERATED UTERUS  06-20-2000   intrauterine preg. 13 wks /  urinary retention   THYROID LOBECTOMY  11/24/2011   Procedure: THYROID LOBECTOMY;  Surgeon: Velora Heckler, MD;  Location: [facility];  Service: General;  Laterality: Left;  Left Thyroid Lobectomy   TUBAL LIGATION  2002   XI ROBOTIC ASSISTED LOWER ANTERIOR RESECTION N/A 12/07/2018   Procedure: XI ROBOTIC ASSISTED LOWER ANTERIOR RESECTION, SPENIC FLEXURE IMMOBILIZATION, COLOANAL ANASTOMOSIS, RIGID PROCTOSCOPY;  Surgeon: Romie Levee, MD;  Location: [facility];  Service: General;  Laterality: N/A;        Objective:   Physical Exam Constitutional:      General: She is not in acute distress.    Appearance: She is well-developed.  Neck:     Thyroid: No thyromegaly.     Trachea: No tracheal deviation.     Comments: Thyroid non tender to palpation. No mass or goiter noted. Left side surgically absent with scar tissue palpated. Cardiovascular:     Rate and Rhythm: Normal rate and regular rhythm.     Heart sounds: Normal heart sounds. No murmur heard. Pulmonary:     Effort: Pulmonary effort is normal.     Breath sounds: Normal breath sounds.  Abdominal:     General: There is no distension.     Palpations: Abdomen is soft.     Tenderness: There is no abdominal tenderness.  Musculoskeletal:     Cervical back: Normal range of motion and neck supple.   Lymphadenopathy:     Cervical: No cervical adenopathy.     Upper Body:     Right upper body: No supraclavicular adenopathy.     Left upper body: No supraclavicular adenopathy.  Skin:    General: Skin is warm and dry.  Neurological:     Mental Status: She is alert and oriented to person, place, and time.  Psychiatric:        Mood and Affect: Mood normal.        Behavior: Behavior normal.        Thought Content: Thought content normal.        Judgment: Judgment normal.    Today's Vitals   01/12/23 1048  BP: 106/76  Pulse: 86  Temp: (!) 97.5 F (36.4 C)  SpO2: 100%  Weight: 134 lb 6.4 oz (61 kg)  Height: 5' 6.5" (1.689 m)   Body mass index is 21.37 kg/m.  Assessment & Plan:   Problem List Items Addressed This Visit       Endocrine   Hypothyroidism   Relevant Orders   TSH     Other   Hyperlipidemia   Relevant Orders   Lipid panel   Other Visit Diagnoses     Well woman exam    -  Primary      Labs ordered. Encouraged healthy diet especially whole foods, avoid processed foods to reduce inflammation. Activity as tolerated. Recommend dental exam. Defers breast and pelvic exams including PAP smear.  Has had flu vaccine. Patient to check with oncologist regarding pneumonia and shingles vaccines. Return in about 1 year (around 01/12/2024) for physical.

## 2023-01-16 ENCOUNTER — Encounter: Payer: Self-pay | Admitting: Nurse Practitioner

## 2023-01-18 ENCOUNTER — Inpatient Hospital Stay: Payer: 59

## 2023-01-18 ENCOUNTER — Inpatient Hospital Stay: Payer: 59 | Admitting: Hematology

## 2023-01-20 ENCOUNTER — Inpatient Hospital Stay: Payer: 59

## 2023-01-25 ENCOUNTER — Inpatient Hospital Stay: Payer: 59

## 2023-01-25 ENCOUNTER — Inpatient Hospital Stay: Payer: 59 | Admitting: Hematology

## 2023-01-25 VITALS — BP 108/75 | HR 86 | Temp 98.0°F

## 2023-01-25 DIAGNOSIS — C20 Malignant neoplasm of rectum: Secondary | ICD-10-CM

## 2023-01-25 DIAGNOSIS — C787 Secondary malignant neoplasm of liver and intrahepatic bile duct: Secondary | ICD-10-CM | POA: Diagnosis not present

## 2023-01-25 DIAGNOSIS — Z5112 Encounter for antineoplastic immunotherapy: Secondary | ICD-10-CM | POA: Diagnosis not present

## 2023-01-25 LAB — COMPREHENSIVE METABOLIC PANEL
ALT: 20 U/L (ref 0–44)
AST: 30 U/L (ref 15–41)
Albumin: 3.8 g/dL (ref 3.5–5.0)
Alkaline Phosphatase: 79 U/L (ref 38–126)
Anion gap: 8 (ref 5–15)
BUN: 13 mg/dL (ref 6–20)
CO2: 24 mmol/L (ref 22–32)
Calcium: 8.7 mg/dL — ABNORMAL LOW (ref 8.9–10.3)
Chloride: 104 mmol/L (ref 98–111)
Creatinine, Ser: 0.81 mg/dL (ref 0.44–1.00)
GFR, Estimated: >60 mL/min (ref >60)
Glucose, Bld: 85 mg/dL (ref 70–99)
Potassium: 3.3 mmol/L — ABNORMAL LOW (ref 3.5–5.1)
Sodium: 136 mmol/L (ref 135–145)
Total Bilirubin: 3.3 mg/dL — ABNORMAL HIGH (ref 0.3–1.2)
Total Protein: 6.9 g/dL (ref 6.5–8.1)

## 2023-01-25 LAB — CBC WITH DIFFERENTIAL/PLATELET
Abs Immature Granulocytes: 0.01 10*3/uL (ref 0.00–0.07)
Basophils Absolute: 0 10*3/uL (ref 0.0–0.1)
Basophils Relative: 1 %
Eosinophils Absolute: 0 10*3/uL (ref 0.0–0.5)
Eosinophils Relative: 1 %
HCT: 33.3 % — ABNORMAL LOW (ref 36.0–46.0)
Hemoglobin: 11.4 g/dL — ABNORMAL LOW (ref 12.0–15.0)
Immature Granulocytes: 1 %
Lymphocytes Relative: 22 %
Lymphs Abs: 0.4 10*3/uL — ABNORMAL LOW (ref 0.7–4.0)
MCH: 35.8 pg — ABNORMAL HIGH (ref 26.0–34.0)
MCHC: 34.2 g/dL (ref 30.0–36.0)
MCV: 104.7 fL — ABNORMAL HIGH (ref 80.0–100.0)
Monocytes Absolute: 0.2 10*3/uL (ref 0.1–1.0)
Monocytes Relative: 12 %
Neutro Abs: 1.1 10*3/uL — ABNORMAL LOW (ref 1.7–7.7)
Neutrophils Relative %: 63 %
Platelets: 57 10*3/uL — ABNORMAL LOW (ref 150–400)
RBC: 3.18 MIL/uL — ABNORMAL LOW (ref 3.87–5.11)
RDW: 14.5 % (ref 11.5–15.5)
WBC: 1.7 10*3/uL — ABNORMAL LOW (ref 4.0–10.5)
nRBC: 0 % (ref 0.0–0.2)

## 2023-01-25 LAB — URINALYSIS, DIPSTICK ONLY
Bilirubin Urine: NEGATIVE
Glucose, UA: NEGATIVE mg/dL
Hgb urine dipstick: NEGATIVE
Ketones, ur: NEGATIVE mg/dL
Nitrite: NEGATIVE
Protein, ur: NEGATIVE mg/dL
Specific Gravity, Urine: 1.016 (ref 1.005–1.030)
pH: 5 (ref 5.0–8.0)

## 2023-01-25 LAB — MAGNESIUM: Magnesium: 2.2 mg/dL (ref 1.7–2.4)

## 2023-01-25 MED ORDER — SODIUM CHLORIDE 0.9 % IV SOLN: Freq: Once | INTRAVENOUS | Status: AC

## 2023-01-25 MED ORDER — DEXAMETHASONE SODIUM PHOSPHATE 10 MG/ML IJ SOLN
10.0000 mg | Freq: Once | INTRAMUSCULAR | Status: AC
  Administered 2023-01-25: 10 mg via INTRAVENOUS
  Filled 2023-01-25: qty 1

## 2023-01-25 MED ORDER — SODIUM CHLORIDE 0.9% FLUSH
10.0000 mL | Freq: Once | INTRAVENOUS | Status: AC
  Administered 2023-01-25: 10 mL via INTRAVENOUS

## 2023-01-25 MED ORDER — SODIUM CHLORIDE 0.9 % IV SOLN
5.0000 mg/kg | Freq: Once | INTRAVENOUS | Status: AC
Start: 2023-01-25 — End: 2023-01-25
  Administered 2023-01-25: 300 mg via INTRAVENOUS
  Filled 2023-01-25: qty 12

## 2023-01-25 MED ORDER — SODIUM CHLORIDE 0.9 % IV SOLN
320.0000 mg/m2 | Freq: Once | INTRAVENOUS | Status: AC
  Administered 2023-01-25: 540 mg via INTRAVENOUS
  Filled 2023-01-25: qty 27

## 2023-01-25 MED ORDER — SODIUM CHLORIDE 0.9 % IV SOLN: 10.0000 mg | Freq: Once | INTRAVENOUS | Status: DC

## 2023-01-25 MED ORDER — PALONOSETRON HCL INJECTION 0.25 MG/5ML
0.2500 mg | Freq: Once | INTRAVENOUS | Status: AC
  Administered 2023-01-25: 0.25 mg via INTRAVENOUS
  Filled 2023-01-25: qty 5

## 2023-01-25 MED ORDER — SODIUM CHLORIDE 0.9 % IV SOLN
1920.0000 mg/m2 | INTRAVENOUS | Status: DC
  Administered 2023-01-25: 3500 mg via INTRAVENOUS
  Filled 2023-01-25: qty 70

## 2023-01-25 MED ORDER — FLUOROURACIL CHEMO INJECTION 500 MG/10ML
320.0000 mg/m2 | Freq: Once | INTRAVENOUS | Status: AC
Start: 2023-01-25 — End: 2023-01-25
  Administered 2023-01-25: 500 mg via INTRAVENOUS
  Filled 2023-01-25: qty 10

## 2023-01-25 MED ORDER — FAMOTIDINE IN NACL 20-0.9 MG/50ML-% IV SOLN
20.0000 mg | Freq: Once | INTRAVENOUS | Status: AC
  Administered 2023-01-25: 20 mg via INTRAVENOUS
  Filled 2023-01-25: qty 50

## 2023-01-25 NOTE — Patient Instructions (Signed)
[facility]  Discharge Instructions: Thank you for choosing [facility] to provide your oncology and hematology care.  If you have a lab appointment with the Cancer Center - please note that after April 8th, 2024, all labs will be drawn in the cancer center.  You do not have to check in or register with the main entrance as you have in the past but will complete your check-in in the cancer center.  Wear comfortable clothing and clothing appropriate for easy access to any Portacath or PICC line.   We strive to give you quality time with your provider. You may need to reschedule your appointment if you arrive late (15 or more minutes).  Arriving late affects you and other patients whose appointments are after yours.  Also, if you miss three or more appointments without notifying the office, you may be dismissed from the clinic at the provider's discretion.      For prescription refill requests, have your pharmacy contact our office and allow 72 hours for refills to be completed.    Today you received the following chemotherapy and/or immunotherapy agents MVASI/Leucovorin/Fluorouracil.  Bevacizumab Injection What is this medication? BEVACIZUMAB (be va SIZ yoo mab) treats some types of cancer. It works by blocking a protein that causes cancer cells to grow and multiply. This helps to slow or stop the spread of cancer cells. It is a monoclonal antibody. This medicine may be used for other purposes; ask your health care provider or pharmacist if you have questions. COMMON BRAND NAME(S): Alymsys, Avastin, MVASI, Omer Jack What should I tell my care team before I take this medication? They need to know if you have any of these conditions: Blood clots Coughing up blood Having or recent surgery Heart failure High blood pressure History of a connection between 2 or more body parts that do not usually connect (fistula) History of a tear in your stomach or  intestines Protein in your urine An unusual or allergic reaction to bevacizumab, other medications, foods, dyes, or preservatives Pregnant or trying to get pregnant Breast-feeding How should I use this medication? This medication is injected into a vein. It is given by your care team in a hospital or clinic setting. Talk to your care team the use of this medication in children. Special care may be needed. Overdosage: If you think you have taken too much of this medicine contact a poison control center or emergency room at once. NOTE: This medicine is only for you. Do not share this medicine with others. What if I miss a dose? Keep appointments for follow-up doses. It is important not to miss your dose. Call your care team if you are unable to keep an appointment. What may interact with this medication? Interactions are not expected. This list may not describe all possible interactions. Give your health care provider a list of all the medicines, herbs, non-prescription drugs, or dietary supplements you use. Also tell them if you smoke, drink alcohol, or use illegal drugs. Some items may interact with your medicine. What should I watch for while using this medication? Your condition will be monitored carefully while you are receiving this medication. You may need blood work while taking this medication. This medication may make you feel generally unwell. This is not uncommon as chemotherapy can affect healthy cells as well as cancer cells. Report any side effects. Continue your course of treatment even though you feel ill unless your care team tells you to stop. This  medication may increase your risk to bruise or bleed. Call your care team if you notice any unusual bleeding. Before having surgery, talk to your care team to make sure it is ok. This medication can increase the risk of poor healing of your surgical site or wound. You will need to stop this medication for 28 days before surgery. After  surgery, wait at least 28 days before restarting this medication. Make sure the surgical site or wound is healed enough before restarting this medication. Talk to your care team if questions. Talk to your care team if you may be pregnant. Serious birth defects can occur if you take this medication during pregnancy and for 6 months after the last dose. Contraception is recommended while taking this medication and for 6 months after the last dose. Your care team can help you find the option that works for you. Do not breastfeed while taking this medication and for 6 months after the last dose. This medication can cause infertility. Talk to your care team if you are concerned about your fertility. What side effects may I notice from receiving this medication? Side effects that you should report to your care team as soon as possible: Allergic reactions--skin rash, itching, hives, swelling of the face, lips, tongue, or throat Bleeding--bloody or black, tar-like stools, vomiting blood or Samantha Reynolds material that looks like coffee grounds, red or dark Samantha Reynolds urine, small red or purple spots on skin, unusual bruising or bleeding Blood clot--pain, swelling, or warmth in the leg, shortness of breath, chest pain Heart attack--pain or tightness in the chest, shoulders, arms, or jaw, nausea, shortness of breath, cold or clammy skin, feeling faint or lightheaded Heart failure--shortness of breath, swelling of the ankles, feet, or hands, sudden weight gain, unusual weakness or fatigue Increase in blood pressure Infection--fever, chills, cough, sore throat, wounds that don't heal, pain or trouble when passing urine, general feeling of discomfort or being unwell Infusion reactions--chest pain, shortness of breath or trouble breathing, feeling faint or lightheaded Kidney injury--decrease in the amount of urine, swelling of the ankles, hands, or feet Stomach pain that is severe, does not go away, or gets worse Stroke--sudden  numbness or weakness of the face, arm, or leg, trouble speaking, confusion, trouble walking, loss of balance or coordination, dizziness, severe headache, change in vision Sudden and severe headache, confusion, change in vision, seizures, which may be signs of posterior reversible encephalopathy syndrome (PRES) Side effects that usually do not require medical attention (report to your care team if they continue or are bothersome): Back pain Change in taste Diarrhea Dry skin Increased tears Nosebleed This list may not describe all possible side effects. Call your doctor for medical advice about side effects. You may report side effects to FDA at 1-800-FDA-1088. Where should I keep my medication? This medication is given in a hospital or clinic. It will not be stored at home. NOTE: This sheet is a summary. It may not cover all possible information. If you have questions about this medicine, talk to your doctor, pharmacist, or health care provider.  2024 Elsevier/Gold Standard (2021-07-30 00:00:00)    Leucovorin Injection What is this medication? LEUCOVORIN (loo koe VOR in) prevents side effects from certain medications, such as methotrexate. It works by increasing folate levels. This helps protect healthy cells in your body. It may also be used to treat anemia caused by low levels of folate. It can also be used with fluorouracil, a type of chemotherapy, to treat colorectal cancer. It works by  increasing the effects of fluorouracil in the body. This medicine may be used for other purposes; ask your health care provider or pharmacist if you have questions. What should I tell my care team before I take this medication? They need to know if you have any of these conditions: Anemia from low levels of vitamin B12 in the blood An unusual or allergic reaction to leucovorin, folic acid, other medications, foods, dyes, or preservatives Pregnant or trying to get pregnant Breastfeeding How should I use  this medication? This medication is injected into a vein or a muscle. It is given by your care team in a hospital or clinic setting. Talk to your care team about the use of this medication in children. Special care may be needed. Overdosage: If you think you have taken too much of this medicine contact a poison control center or emergency room at once. NOTE: This medicine is only for you. Do not share this medicine with others. What if I miss a dose? Keep appointments for follow-up doses. It is important not to miss your dose. Call your care team if you are unable to keep an appointment. What may interact with this medication? Capecitabine Fluorouracil Phenobarbital Phenytoin Primidone Trimethoprim;sulfamethoxazole This list may not describe all possible interactions. Give your health care provider a list of all the medicines, herbs, non-prescription drugs, or dietary supplements you use. Also tell them if you smoke, drink alcohol, or use illegal drugs. Some items may interact with your medicine. What should I watch for while using this medication? Your condition will be monitored carefully while you are receiving this medication. This medication may increase the side effects of 5-fluorouracil. Tell your care team if you have diarrhea or mouth sores that do not get better or that get worse. What side effects may I notice from receiving this medication? Side effects that you should report to your care team as soon as possible: Allergic reactions--skin rash, itching, hives, swelling of the face, lips, tongue, or throat This list may not describe all possible side effects. Call your doctor for medical advice about side effects. You may report side effects to FDA at 1-800-FDA-1088. Where should I keep my medication? This medication is given in a hospital or clinic. It will not be stored at home. NOTE: This sheet is a summary. It may not cover all possible information. If you have questions about  this medicine, talk to your doctor, pharmacist, or health care provider.  2024 Elsevier/Gold Standard (2021-08-17 00:00:00)    Fluorouracil Injection What is this medication? FLUOROURACIL (flure oh YOOR a sil) treats some types of cancer. It works by slowing down the growth of cancer cells. This medicine may be used for other purposes; ask your health care provider or pharmacist if you have questions. COMMON BRAND NAME(S): Adrucil What should I tell my care team before I take this medication? They need to know if you have any of these conditions: Blood disorders Dihydropyrimidine dehydrogenase (DPD) deficiency Infection, such as chickenpox, cold sores, herpes Kidney disease Liver disease Poor nutrition Recent or ongoing radiation therapy An unusual or allergic reaction to fluorouracil, other medications, foods, dyes, or preservatives If you or your partner are pregnant or trying to get pregnant Breast-feeding How should I use this medication? This medication is injected into a vein. It is administered by your care team in a hospital or clinic setting. Talk to your care team about the use of this medication in children. Special care may be needed. Overdosage: If you  think you have taken too much of this medicine contact a poison control center or emergency room at once. NOTE: This medicine is only for you. Do not share this medicine with others. What if I miss a dose? Keep appointments for follow-up doses. It is important not to miss your dose. Call your care team if you are unable to keep an appointment. What may interact with this medication? Do not take this medication with any of the following: Live virus vaccines This medication may also interact with the following: Medications that treat or prevent blood clots, such as warfarin, enoxaparin, dalteparin This list may not describe all possible interactions. Give your health care provider a list of all the medicines, herbs,  non-prescription drugs, or dietary supplements you use. Also tell them if you smoke, drink alcohol, or use illegal drugs. Some items may interact with your medicine. What should I watch for while using this medication? Your condition will be monitored carefully while you are receiving this medication. This medication may make you feel generally unwell. This is not uncommon as chemotherapy can affect healthy cells as well as cancer cells. Report any side effects. Continue your course of treatment even though you feel ill unless your care team tells you to stop. In some cases, you may be given additional medications to help with side effects. Follow all directions for their use. This medication may increase your risk of getting an infection. Call your care team for advice if you get a fever, chills, sore throat, or other symptoms of a cold or flu. Do not treat yourself. Try to avoid being around people who are sick. This medication may increase your risk to bruise or bleed. Call your care team if you notice any unusual bleeding. Be careful brushing or flossing your teeth or using a toothpick because you may get an infection or bleed more easily. If you have any dental work done, tell your dentist you are receiving this medication. Avoid taking medications that contain aspirin, acetaminophen, ibuprofen, naproxen, or ketoprofen unless instructed by your care team. These medications may hide a fever. Do not treat diarrhea with over the counter products. Contact your care team if you have diarrhea that lasts more than 2 days or if it is severe and watery. This medication can make you more sensitive to the sun. Keep out of the sun. If you cannot avoid being in the sun, wear protective clothing and sunscreen. Do not use sun lamps, tanning beds, or tanning booths. Talk to your care team if you or your partner wish to become pregnant or think you might be pregnant. This medication can cause serious birth defects if  taken during pregnancy and for 3 months after the last dose. A reliable form of contraception is recommended while taking this medication and for 3 months after the last dose. Talk to your care team about effective forms of contraception. Do not father a child while taking this medication and for 3 months after the last dose. Use a condom while having sex during this time period. Do not breastfeed while taking this medication. This medication may cause infertility. Talk to your care team if you are concerned about your fertility. What side effects may I notice from receiving this medication? Side effects that you should report to your care team as soon as possible: Allergic reactions--skin rash, itching, hives, swelling of the face, lips, tongue, or throat Heart attack--pain or tightness in the chest, shoulders, arms, or jaw, nausea, shortness of breath,  cold or clammy skin, feeling faint or lightheaded Heart failure--shortness of breath, swelling of the ankles, feet, or hands, sudden weight gain, unusual weakness or fatigue Heart rhythm changes--fast or irregular heartbeat, dizziness, feeling faint or lightheaded, chest pain, trouble breathing High ammonia level--unusual weakness or fatigue, confusion, loss of appetite, nausea, vomiting, seizures Infection--fever, chills, cough, sore throat, wounds that don't heal, pain or trouble when passing urine, general feeling of discomfort or being unwell Low red blood cell level--unusual weakness or fatigue, dizziness, headache, trouble breathing Pain, tingling, or numbness in the hands or feet, muscle weakness, change in vision, confusion or trouble speaking, loss of balance or coordination, trouble walking, seizures Redness, swelling, and blistering of the skin over hands and feet Severe or prolonged diarrhea Unusual bruising or bleeding Side effects that usually do not require medical attention (report to your care team if they continue or are  bothersome): Dry skin Headache Increased tears Nausea Pain, redness, or swelling with sores inside the mouth or throat Sensitivity to light Vomiting This list may not describe all possible side effects. Call your doctor for medical advice about side effects. You may report side effects to FDA at 1-800-FDA-1088. Where should I keep my medication? This medication is given in a hospital or clinic. It will not be stored at home. NOTE: This sheet is a summary. It may not cover all possible information. If you have questions about this medicine, talk to your doctor, pharmacist, or health care provider.  2024 Elsevier/Gold Standard (2021-07-20 00:00:00)        To help prevent nausea and vomiting after your treatment, we encourage you to take your nausea medication as directed.  BELOW ARE SYMPTOMS THAT SHOULD BE REPORTED IMMEDIATELY: *FEVER GREATER THAN 100.4 F (38 C) OR HIGHER *CHILLS OR SWEATING *NAUSEA AND VOMITING THAT IS NOT CONTROLLED WITH YOUR NAUSEA MEDICATION *UNUSUAL SHORTNESS OF BREATH *UNUSUAL BRUISING OR BLEEDING *URINARY PROBLEMS (pain or burning when urinating, or frequent urination) *BOWEL PROBLEMS (unusual diarrhea, constipation, pain near the anus) TENDERNESS IN MOUTH AND THROAT WITH OR WITHOUT PRESENCE OF ULCERS (sore throat, sores in mouth, or a toothache) UNUSUAL RASH, SWELLING OR PAIN  UNUSUAL VAGINAL DISCHARGE OR ITCHING   Items with * indicate a potential emergency and should be followed up as soon as possible or go to the Emergency Department if any problems should occur.  Please show the CHEMOTHERAPY ALERT CARD or IMMUNOTHERAPY ALERT CARD at check-in to the Emergency Department and triage nurse.  Should you have questions after your visit or need to cancel or reschedule your appointment, please contact [facility] [phone]  and follow the prompts.  Office hours are 8:00 a.m. to 4:30 p.m. Monday - Friday. Please note that voicemails left  after 4:00 p.m. may not be returned until the following business day.  We are closed weekends and major holidays. You have access to a nurse at all times for urgent questions. Please call the main number to the clinic [phone] and follow the prompts.  For any non-urgent questions, you may also contact your provider using MyChart. We now offer e-Visits for anyone 30 and older to request care online for non-urgent symptoms. For details visit [email or web address].   Also download the MyChart app! Go to the app store, search "MyChart", open the app, select [place], and log in with your MyChart username and password.

## 2023-01-25 NOTE — Progress Notes (Signed)
Patient presents today for chemotherapy infusion.  Patient is in satisfactory condition with no new complaints voiced.  Vital signs are stable.  Labs reviewed.  Bilirubin today is 3.3, platelets are 57 and ANC is 1.1.  Dr. Ellin Saba made aware.  All other labs are within treatment parameters.  Urine protein is negative.   Potassium today is 3.3.  Patient refused Klor con standing order.  We will proceed with treatment per MD orders.    Patient tolerated treatment well with no complaints voiced.  Home infusion 5FU pump connected with no issues.  Patient left ambulatory with husband in stable condition.  Vital signs stable at discharge.  Follow up as scheduled.

## 2023-01-27 ENCOUNTER — Inpatient Hospital Stay: Payer: 59 | Attending: Hematology

## 2023-01-27 VITALS — BP 111/77 | HR 87 | Temp 98.2°F | Resp 17

## 2023-01-27 DIAGNOSIS — R918 Other nonspecific abnormal finding of lung field: Secondary | ICD-10-CM | POA: Insufficient documentation

## 2023-01-27 DIAGNOSIS — R066 Hiccough: Secondary | ICD-10-CM | POA: Diagnosis not present

## 2023-01-27 DIAGNOSIS — Z880 Allergy status to penicillin: Secondary | ICD-10-CM | POA: Insufficient documentation

## 2023-01-27 DIAGNOSIS — Z8042 Family history of malignant neoplasm of prostate: Secondary | ICD-10-CM | POA: Insufficient documentation

## 2023-01-27 DIAGNOSIS — D72819 Decreased white blood cell count, unspecified: Secondary | ICD-10-CM | POA: Insufficient documentation

## 2023-01-27 DIAGNOSIS — Z8249 Family history of ischemic heart disease and other diseases of the circulatory system: Secondary | ICD-10-CM | POA: Insufficient documentation

## 2023-01-27 DIAGNOSIS — Z881 Allergy status to other antibiotic agents status: Secondary | ICD-10-CM | POA: Insufficient documentation

## 2023-01-27 DIAGNOSIS — C787 Secondary malignant neoplasm of liver and intrahepatic bile duct: Secondary | ICD-10-CM | POA: Diagnosis not present

## 2023-01-27 DIAGNOSIS — Z885 Allergy status to narcotic agent status: Secondary | ICD-10-CM | POA: Diagnosis not present

## 2023-01-27 DIAGNOSIS — J3489 Other specified disorders of nose and nasal sinuses: Secondary | ICD-10-CM | POA: Diagnosis not present

## 2023-01-27 DIAGNOSIS — K219 Gastro-esophageal reflux disease without esophagitis: Secondary | ICD-10-CM | POA: Insufficient documentation

## 2023-01-27 DIAGNOSIS — Z9221 Personal history of antineoplastic chemotherapy: Secondary | ICD-10-CM | POA: Diagnosis not present

## 2023-01-27 DIAGNOSIS — Z87891 Personal history of nicotine dependence: Secondary | ICD-10-CM | POA: Diagnosis not present

## 2023-01-27 DIAGNOSIS — Z79890 Hormone replacement therapy (postmenopausal): Secondary | ICD-10-CM | POA: Insufficient documentation

## 2023-01-27 DIAGNOSIS — Z808 Family history of malignant neoplasm of other organs or systems: Secondary | ICD-10-CM | POA: Insufficient documentation

## 2023-01-27 DIAGNOSIS — Z5111 Encounter for antineoplastic chemotherapy: Secondary | ICD-10-CM | POA: Insufficient documentation

## 2023-01-27 DIAGNOSIS — Z801 Family history of malignant neoplasm of trachea, bronchus and lung: Secondary | ICD-10-CM | POA: Diagnosis not present

## 2023-01-27 DIAGNOSIS — Z85850 Personal history of malignant neoplasm of thyroid: Secondary | ICD-10-CM | POA: Insufficient documentation

## 2023-01-27 DIAGNOSIS — Z79899 Other long term (current) drug therapy: Secondary | ICD-10-CM | POA: Diagnosis not present

## 2023-01-27 DIAGNOSIS — C7931 Secondary malignant neoplasm of brain: Secondary | ICD-10-CM | POA: Diagnosis not present

## 2023-01-27 DIAGNOSIS — C20 Malignant neoplasm of rectum: Secondary | ICD-10-CM | POA: Insufficient documentation

## 2023-01-27 DIAGNOSIS — Z5112 Encounter for antineoplastic immunotherapy: Secondary | ICD-10-CM | POA: Insufficient documentation

## 2023-01-27 DIAGNOSIS — R161 Splenomegaly, not elsewhere classified: Secondary | ICD-10-CM | POA: Diagnosis not present

## 2023-01-27 DIAGNOSIS — Z79631 Long term (current) use of antimetabolite agent: Secondary | ICD-10-CM | POA: Diagnosis not present

## 2023-01-27 DIAGNOSIS — D696 Thrombocytopenia, unspecified: Secondary | ICD-10-CM | POA: Diagnosis not present

## 2023-01-27 DIAGNOSIS — Z9049 Acquired absence of other specified parts of digestive tract: Secondary | ICD-10-CM | POA: Diagnosis not present

## 2023-01-27 MED ORDER — HEPARIN SOD (PORK) LOCK FLUSH 100 UNIT/ML IV SOLN
500.0000 [IU] | Freq: Once | INTRAVENOUS | Status: AC | PRN
Start: 2023-01-27 — End: 2023-01-27
  Administered 2023-01-27: 500 [IU]

## 2023-01-27 MED ORDER — SODIUM CHLORIDE 0.9% FLUSH
10.0000 mL | INTRAVENOUS | Status: DC | PRN
  Administered 2023-01-27: 10 mL

## 2023-01-27 NOTE — Patient Instructions (Signed)
[facility]  Discharge Instructions: Thank you for choosing [facility] to provide your oncology and hematology care.  If you have a lab appointment with the Cancer Center - please note that after April 8th, 2024, all labs will be drawn in the cancer center.  You do not have to check in or register with the main entrance as you have in the past but will complete your check-in in the cancer center.  Wear comfortable clothing and clothing appropriate for easy access to any Portacath or PICC line.   We strive to give you quality time with your provider. You may need to reschedule your appointment if you arrive late (15 or more minutes).  Arriving late affects you and other patients whose appointments are after yours.  Also, if you miss three or more appointments without notifying the office, you may be dismissed from the clinic at the provider's discretion.      For prescription refill requests, have your pharmacy contact our office and allow 72 hours for refills to be completed.    Today you received the following chemotherapy and/or immunotherapy agents Adrucil. Fluorouracil Injection What is this medication? FLUOROURACIL (flure oh YOOR a sil) treats some types of cancer. It works by slowing down the growth of cancer cells. This medicine may be used for other purposes; ask your health care provider or pharmacist if you have questions. COMMON BRAND NAME(S): Adrucil What should I tell my care team before I take this medication? They need to know if you have any of these conditions: Blood disorders Dihydropyrimidine dehydrogenase (DPD) deficiency Infection, such as chickenpox, cold sores, herpes Kidney disease Liver disease Poor nutrition Recent or ongoing radiation therapy An unusual or allergic reaction to fluorouracil, other medications, foods, dyes, or preservatives If you or your partner are pregnant or trying to get pregnant Breast-feeding How should  I use this medication? This medication is injected into a vein. It is administered by your care team in a hospital or clinic setting. Talk to your care team about the use of this medication in children. Special care may be needed. Overdosage: If you think you have taken too much of this medicine contact a poison control center or emergency room at once. NOTE: This medicine is only for you. Do not share this medicine with others. What if I miss a dose? Keep appointments for follow-up doses. It is important not to miss your dose. Call your care team if you are unable to keep an appointment. What may interact with this medication? Do not take this medication with any of the following: Live virus vaccines This medication may also interact with the following: Medications that treat or prevent blood clots, such as warfarin, enoxaparin, dalteparin This list may not describe all possible interactions. Give your health care provider a list of all the medicines, herbs, non-prescription drugs, or dietary supplements you use. Also tell them if you smoke, drink alcohol, or use illegal drugs. Some items may interact with your medicine. What should I watch for while using this medication? Your condition will be monitored carefully while you are receiving this medication. This medication may make you feel generally unwell. This is not uncommon as chemotherapy can affect healthy cells as well as cancer cells. Report any side effects. Continue your course of treatment even though you feel ill unless your care team tells you to stop. In some cases, you may be given additional medications to help with side effects. Follow all directions   for their use. This medication may increase your risk of getting an infection. Call your care team for advice if you get a fever, chills, sore throat, or other symptoms of a cold or flu. Do not treat yourself. Try to avoid being around people who are sick. This medication may increase  your risk to bruise or bleed. Call your care team if you notice any unusual bleeding. Be careful brushing or flossing your teeth or using a toothpick because you may get an infection or bleed more easily. If you have any dental work done, tell your dentist you are receiving this medication. Avoid taking medications that contain aspirin, acetaminophen, ibuprofen, naproxen, or ketoprofen unless instructed by your care team. These medications may hide a fever. Do not treat diarrhea with over the counter products. Contact your care team if you have diarrhea that lasts more than 2 days or if it is severe and watery. This medication can make you more sensitive to the sun. Keep out of the sun. If you cannot avoid being in the sun, wear protective clothing and sunscreen. Do not use sun lamps, tanning beds, or tanning booths. Talk to your care team if you or your partner wish to become pregnant or think you might be pregnant. This medication can cause serious birth defects if taken during pregnancy and for 3 months after the last dose. A reliable form of contraception is recommended while taking this medication and for 3 months after the last dose. Talk to your care team about effective forms of contraception. Do not father a child while taking this medication and for 3 months after the last dose. Use a condom while having sex during this time period. Do not breastfeed while taking this medication. This medication may cause infertility. Talk to your care team if you are concerned about your fertility. What side effects may I notice from receiving this medication? Side effects that you should report to your care team as soon as possible: Allergic reactions--skin rash, itching, hives, swelling of the face, lips, tongue, or throat Heart attack--pain or tightness in the chest, shoulders, arms, or jaw, nausea, shortness of breath, cold or clammy skin, feeling faint or lightheaded Heart failure--shortness of breath,  swelling of the ankles, feet, or hands, sudden weight gain, unusual weakness or fatigue Heart rhythm changes--fast or irregular heartbeat, dizziness, feeling faint or lightheaded, chest pain, trouble breathing High ammonia level--unusual weakness or fatigue, confusion, loss of appetite, nausea, vomiting, seizures Infection--fever, chills, cough, sore throat, wounds that don't heal, pain or trouble when passing urine, general feeling of discomfort or being unwell Low red blood cell level--unusual weakness or fatigue, dizziness, headache, trouble breathing Pain, tingling, or numbness in the hands or feet, muscle weakness, change in vision, confusion or trouble speaking, loss of balance or coordination, trouble walking, seizures Redness, swelling, and blistering of the skin over hands and feet Severe or prolonged diarrhea Unusual bruising or bleeding Side effects that usually do not require medical attention (report to your care team if they continue or are bothersome): Dry skin Headache Increased tears Nausea Pain, redness, or swelling with sores inside the mouth or throat Sensitivity to light Vomiting This list may not describe all possible side effects. Call your doctor for medical advice about side effects. You may report side effects to FDA at 1-800-FDA-1088. Where should I keep my medication? This medication is given in a hospital or clinic. It will not be stored at home. NOTE: This sheet is a summary. It may not   cover all possible information. If you have questions about this medicine, talk to your doctor, pharmacist, or health care provider.  2024 Elsevier/Gold Standard (2021-07-20 00:00:00)       To help prevent nausea and vomiting after your treatment, we encourage you to take your nausea medication as directed.  BELOW ARE SYMPTOMS THAT SHOULD BE REPORTED IMMEDIATELY: *FEVER GREATER THAN 100.4 F (38 C) OR HIGHER *CHILLS OR SWEATING *NAUSEA AND VOMITING THAT IS NOT CONTROLLED  WITH YOUR NAUSEA MEDICATION *UNUSUAL SHORTNESS OF BREATH *UNUSUAL BRUISING OR BLEEDING *URINARY PROBLEMS (pain or burning when urinating, or frequent urination) *BOWEL PROBLEMS (unusual diarrhea, constipation, pain near the anus) TENDERNESS IN MOUTH AND THROAT WITH OR WITHOUT PRESENCE OF ULCERS (sore throat, sores in mouth, or a toothache) UNUSUAL RASH, SWELLING OR PAIN  UNUSUAL VAGINAL DISCHARGE OR ITCHING   Items with * indicate a potential emergency and should be followed up as soon as possible or go to the Emergency Department if any problems should occur.  Please show the CHEMOTHERAPY ALERT CARD or IMMUNOTHERAPY ALERT CARD at check-in to the Emergency Department and triage nurse.  Should you have questions after your visit or need to cancel or reschedule your appointment, please contact [facility] [phone]  and follow the prompts.  Office hours are 8:00 a.m. to 4:30 p.m. Monday - Friday. Please note that voicemails left after 4:00 p.m. may not be returned until the following business day.  We are closed weekends and major holidays. You have access to a nurse at all times for urgent questions. Please call the main number to the clinic [phone] and follow the prompts.  For any non-urgent questions, you may also contact your provider using MyChart. We now offer e-Visits for anyone 18 and older to request care online for non-urgent symptoms. For details visit [email or web address].   Also download the MyChart app! Go to the app store, search "MyChart", open the app, select [facility], and log in with your MyChart username and password.   

## 2023-01-27 NOTE — Progress Notes (Signed)
Patient presents today for pump d/c. Vital signs are stable. Port a cath site clean, dry, and intact. Port flushed with 10 mls of Normal Saline and 500 Units of Heparin. Needle removed intact. Band aid applied. Patient has no complaints at this time. Discharged from clinic ambulatory and in stable condition. Patient alert and oriented.  

## 2023-02-05 DIAGNOSIS — C787 Secondary malignant neoplasm of liver and intrahepatic bile duct: Secondary | ICD-10-CM | POA: Diagnosis not present

## 2023-02-05 DIAGNOSIS — C20 Malignant neoplasm of rectum: Secondary | ICD-10-CM | POA: Diagnosis not present

## 2023-02-08 ENCOUNTER — Other Ambulatory Visit: Payer: Self-pay

## 2023-02-08 ENCOUNTER — Inpatient Hospital Stay: Payer: 59

## 2023-02-08 ENCOUNTER — Inpatient Hospital Stay: Payer: 59 | Admitting: Hematology

## 2023-02-08 NOTE — Progress Notes (Signed)
[facility] [address], [place] [address]    Clinic Day:  02/09/2023  Referring physician: Babs Sciara, MD  Patient Care Team: Babs Sciara, MD as PCP - General (Family Medicine) [name], MD as Medical Oncologist (Medical Oncology)   ASSESSMENT & PLAN:   Assessment: 1.  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  Liver biopsy on 03/09/2018 consistent with adenocarcinoma. 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 -7 cycles of FOLFOX with vectibix completed on 06/27/2018. -XRT with Xeloda from 07/30/2018 through 09/06/2018. -Right liver lesion microwave ablation on 10/15/2018 at [facility]. -Low anterior resection and diverting ileostomy on 12/07/2018, pathology [id number], 0/6 lymph nodes positive, margins negative. -Loop ileostomy reversal on 04/17/2019. - Microwave ablation of the right hepatic lobe lesion by Dr. Selena Batten around 08/15/2019. -SBRT to the left lower lobe lung lesion and left liver lobe lesion on 12/13/2019 at [facility]. - Right upper lobe lesion SBRT from 08/06/2020 through 08/13/2020. - 3 left lung lesions and subcarinal lymph node IMRT from 09/21/2021 through 10/05/2021. - Liver biopsy (06/09/2022): Metastatic moderately differentiated colonic adenocarcinoma. - NGS: KRAS/NRAS/BRAF wild-type, MSI-stable, HER2-0, negative for other targetable mutations - Cycle 1 of FOLFOX and bevacizumab on 07/06/2022, oxaliplatin discontinued from 11/09/2022 (cycle 8), 5-FU and bevacizumab maintenance changed to every 3 weeks on 01/04/2023 - XRT to the right neck from 07/14/2022 through 08/03/2022    Plan: 1.  Stage IV rectal adenocarcinoma, BRAF/RAS wild-type, MSI-stable: - PET scan on 11/03/2022 showed very good response. - She is receiving maintenance 5-FU and bevacizumab every 3 weeks. - 2 to 3 days after each treatment she will have sinus pressure.  She also has chronic  greenish discharge on blowing nose.  After each treatment she either gets hiccups or acid reflux same day or next day after treatment.  It usually lasts about 2 to 3 days. - Reviewed labs today: Normal LFTs and creatinine.  CBC with white count 2.1 and ANC of 1.5.  Platelet count improved to 63. - She will come back next week to receive her treatment.  We will check a CEA level today. - RTC 04/26/2023 with repeat PET scan.   2.  Leukopenia and thrombocytopenia: - Intermittent leukopenia and thrombocytopenia exacerbated by splenomegaly.  Will closely monitor.   3.  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 - She will have another MRI on 03/24/2023.    Orders Placed This Encounter  Procedures   NM PET Image Restag (PS) Skull Base To Thigh    Standing Status:   Future    Standing Expiration Date:   02/09/2024    Order Specific Question:   If indicated for the ordered procedure, I authorize the administration of a radiopharmaceutical per Radiology protocol    Answer:   Yes    Order Specific Question:   Is the patient pregnant?    Answer:   No    Order Specific Question:   Preferred imaging location?    Answer:   [facility]    Standing Status:   Future    Standing Expiration Date:   04/18/2024   CBC with Differential    Standing Status:   Future    Standing Expiration Date:   04/18/2024   Comprehensive metabolic panel    Standing Status:   Future    Standing Expiration Date:   04/18/2024  Urinalysis, dipstick only    Standing Status:   Future    Standing Expiration Date:   04/18/2024      Alben Deeds Teague,acting as a scribe for [name], MD.,have documented all relevant documentation on the behalf of [name], MD,as directed by  [name], MD while in the presence of [name], MD.  I, [name] MD, have reviewed the above documentation for accuracy and  completeness, and I agree with the above.    [name], MD   11/14/20245:28 PM  CHIEF COMPLAINT:   Diagnosis: metastatic rectal cancer to liver    Cancer Staging  Malignant neoplasm of rectum [facility]) Staging form: Colon and Rectum, AJCC 8th Edition - Clinical stage from 03/13/2018: Stage IVA (cT3, cN1b, cM1a) - Signed by [name], MD on 03/13/2018    Prior Therapy: 1. FOLFOX & vectibix x 7 cycles from 03/14/2018 to 06/27/2018. 2. XRT with Xeloda from 07/30/2018 to 09/06/2018. 3. Right liver lesion microwave ablation on 10/15/2018. 4. Low anterior resection and diverting ileostomy on 12/07/2018. 5. Right hepatic lobe microwave ablation on 08/15/19 6. SBRT to LLL and liver 11/15/2019 -12/13/2019. 7. SBRT to RUL 08/06/20 - 08/13/20 8. IMRT to 3 left lung lesions and subcarinal LN 09/21/21 - 10/05/21 9. XRT to C6 07/14/22 - 08/03/22  Current Therapy:  FOLFOX and bevacizumab    HISTORY OF PRESENT ILLNESS:   Oncology History  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HCC)  02/26/2018 Initial Diagnosis   Rectal cancer (HCC)   03/13/2018 Cancer Staging   Staging form: Colon and Rectum, AJCC 8th Edition - Clinical stage from 03/13/2018: Stage IVA (cT3, cN1b, cM1a) - Signed by [name], MD on 03/13/2018   03/14/2018 - 06/29/2018 Chemotherapy   The patient had palonosetron (ALOXI) injection 0.25 mg, 0.25 mg, Intravenous,  Once, 7 of 8 cycles Administration: 0.25 mg (03/14/2018), 0.25 mg (03/27/2018), 0.25 mg (04/18/2018), 0.25 mg (05/02/2018), 0.25 mg (05/16/2018), 0.25 mg (06/05/2018), 0.25 mg (06/27/2018) leucovorin 800 mg in dextrose 5 % 250 mL infusion, 772 mg, Intravenous,  Once, 7 of 8 cycles Administration: 800 mg (03/14/2018), 800 mg (03/27/2018), 800 mg (05/02/2018), 800 mg (05/16/2018), 700 mg (04/18/2018), 800 mg (06/05/2018), 800 mg (06/27/2018) oxaliplatin (ELOXATIN) 165 mg in dextrose 5 % 500 mL chemo infusion, 85 mg/m2 = 165 mg, Intravenous,  Once, 6 of 7 cycles Dose  modification: 68 mg/m2 (80 % of original dose 85 mg/m2, Cycle 4, Reason: Provider Judgment) Administration: 165 mg (03/14/2018), 165 mg (03/27/2018), 130 mg (05/02/2018), 130 mg (05/16/2018), 130 mg (06/05/2018), 130 mg (06/27/2018) panitumumab (VECTIBIX) 500 mg in sodium chloride 0.9 % 100 mL chemo infusion, 480 mg, Intravenous,  Once, 4 of 5 cycles Administration: 500 mg (04/18/2018), 480 mg (05/02/2018), 480 mg (05/16/2018), 480 mg (06/05/2018) fluorouracil (ADRUCIL) chemo injection 750 mg, 400 mg/m2 = 750 mg (100 % of original dose 400 mg/m2), Intravenous,  Once, 6 of 7 cycles Dose modification: 400 mg/m2 (original dose 400 mg/m2, Cycle 1), 400 mg/m2 (original dose 400 mg/m2, Cycle 3) Administration: 750 mg (03/14/2018), 750 mg (04/18/2018), 750 mg (05/02/2018), 750 mg (05/16/2018), 750 mg (06/05/2018), 750 mg (06/27/2018) fosaprepitant (EMEND) 150 mg, dexamethasone (DECADRON) 12 mg in sodium chloride 0.9 % 145 mL IVPB, , Intravenous,  Once, 4 of 5 cycles Administration:  (05/02/2018),  (05/16/2018),  (06/05/2018),  (06/27/2018) fluorouracil (ADRUCIL) 4,650 mg in sodium chloride 0.9 % 57 mL chemo infusion, 2,400 mg/m2 = 4,650 mg, Intravenous, 1 Day/Dose, 7 of 8 cycles Administration: 4,650 mg (03/14/2018), 4,650 mg (03/27/2018), 4,650  mg (04/18/2018), 4,650 mg (05/02/2018), 4,650 mg (05/16/2018), 4,650 mg (06/05/2018), 4,650 mg (06/27/2018)  for chemotherapy treatment.    07/06/2022 -  Chemotherapy   Patient is on Treatment Plan : COLORECTAL FOLFOX + Bevacizumab q14d        INTERVAL HISTORY:   Samantha Reynolds is a 57 y.o. female presenting to clinic today for follow up of metastatic rectal cancer to liver. She was last seen by me on 01/04/23.  Today, she states that she is doing well overall. Her appetite level is at 75%. Her energy level is at 75%. She is accompanied by her husband.   She tolerated her last treatment of chemotherapy well. She states she occasionally gets acid reflux  few hours after chemotherapy. She otherwise  gets on and off hiccups 5-6x/day for 15-20 minutes at a time that lasts for 2-3 days, starting the day after she receives chemotherapy. These symptoms have occurred since starting her second round of chemotherapy, and she has had at least 3 bouts of hiccups. She notes after chemotherapy she has sinus pressure that lasts for 3-4 days. She also notes easily bruising. She denies any epistaxis.   She reports constant rhinorrhea and sinus drainage that is a greenish/yellow color. She notes pain in the nasal area in the morning she attributes to a buildup of drainage that resolves after clearing it up. She has not taken Mucinex. She has tried Tylenol Cold and Tylenol Sinus, with Tylenol Sinus effectively treating symptoms.   She notes her bones pop easily and asked if this could be due to XRT. She denies any pain with this.   PAST MEDICAL HISTORY:   Past Medical History: Past Medical History:  Diagnosis Date   Anxiety    Cancer (HCC) 2013   thyroid   Endometrial polyp    Endometrial thickening on ultra sound    GERD (gastroesophageal reflux disease)    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   Worse in her 34s when she was ill   H/O Hashimoto thyroiditis    History of chemotherapy    History of radiation therapy    History of thyroid cancer no recurrence   2013--  s/p  left lobe thyroidectomy--  follicular varient papillary / lymphocyctic thyroiditis   Hyperlipidemia 11/09/2015   Hypothyroidism    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HCC) 02/26/2018    Surgical History: Past Surgical History:  Procedure Laterality Date   BIOPSY  02/19/2018   Procedure: BIOPSY;  Surgeon: Malissa Hippo, MD;  [facility];  Service: Endoscopy;;  rectum   COLONOSCOPY N/A 08/20/2015   Procedure: COLONOSCOPY;  Surgeon: Malissa Hippo, MD;  [facility]: [facility];  Service: Endoscopy;  Laterality: N/A;  730   COLONOSCOPY WITH PROPOFOL N/A 07/21/2021   Procedure: COLONOSCOPY WITH PROPOFOL;  Surgeon: Malissa Hippo,  MD;  [facility]: [facility];  Service: Endoscopy;  Laterality: N/A;  10:10   D & C HYSTEROOSCOPY W/ THERMACHOICE ENDOMETRIAL ABLATION  08-13-2004   DIVERTING ILEOSTOMY N/A 12/07/2018   Procedure: DIVERTING LOOP ILEOSTOMY;  Surgeon: Romie Levee, MD;  Location: [facility];  Service: General;  Laterality: N/A;   FLEXIBLE SIGMOIDOSCOPY N/A 02/19/2018   Procedure: FLEXIBLE SIGMOIDOSCOPY;  Surgeon: Malissa Hippo, MD;  [facility];  Service: Endoscopy;  Laterality: N/A;   HYSTEROSCOPY WITH D & C N/A 04/30/2015   Procedure: DILATATION AND CURETTAGE / INTENDED HYSTEROSCOPY;  Surgeon: Marcelle Overlie, MD;  Location: [facility];  Service: Gynecology;  Laterality: N/A;   ILEOSTOMY  CLOSURE N/A 04/17/2019   Procedure: LOOP ILEOSTOMY REVERSAL;  Surgeon: Romie Levee, MD;  Location: [facility];  Service: General;  Laterality: N/A;   IR US GUIDE BX ASP/DRAIN  03/09/2018   LAPAROSCOPIC CHOLECYSTECTOMY  04/1996   LAPAROSCOPY N/A 12/11/2018   Procedure: LAPAROSCOPY  ILEOSTOMY REVISION AND ABDOMINAL WASHOUT;  Surgeon: Romie Levee, MD;  Location: [facility];  Service: General;  Laterality: N/A;   Liver Microwave   10/17/2018   POLYPECTOMY  08/20/2015   Procedure: POLYPECTOMY;  Surgeon: Malissa Hippo, MD;  [facility];  Service: Endoscopy;;  Splenic flexure polypectomy   POLYPECTOMY  02/19/2018   Procedure: POLYPECTOMY;  Surgeon: Malissa Hippo, MD;  [facility];  Service: Endoscopy;;  rectum   PORTACATH PLACEMENT N/A 03/14/2018   Procedure: INSERTION PORT-A-CATH;  Surgeon: Franky Macho, MD;  Location: [facility];  Service: General;  Laterality: N/A;   REDUCTION INCARCERATED UTERUS  06-20-2000   intrauterine preg. 13 wks /  urinary retention   THYROID LOBECTOMY  11/24/2011   Procedure: THYROID LOBECTOMY;  Surgeon: Velora Heckler, MD;  Location: [facility];  Service: General;  Laterality: Left;  Left Thyroid Lobectomy   TUBAL LIGATION  2002   XI ROBOTIC ASSISTED LOWER  ANTERIOR RESECTION N/A 12/07/2018   Procedure: XI ROBOTIC ASSISTED LOWER ANTERIOR RESECTION, SPENIC FLEXURE IMMOBILIZATION, COLOANAL ANASTOMOSIS, RIGID PROCTOSCOPY;  Surgeon: Romie Levee, MD;  Location: [facility];  Service: General;  Laterality: N/A;    Social History: Social History   Socioeconomic History   Marital status: Married    Spouse name: Not on file   Number of children: 4   Years of education: Not on file   Highest education level: Not on file  Occupational History    Comment: Systems developer at [employer]  Tobacco Use   Smoking status: Former    Current packs/day: 0.00    Average packs/day: 0.3 packs/day for 10.0 years (2.5 ttl pk-yrs)    Types: Cigarettes    Start date: 10/30/1981    Quit date: 10/31/1991    Years since quitting: 31.2   Smokeless tobacco: Never  Vaping Use   Vaping status: Never Used  Substance and Sexual Activity   Alcohol use: No   Drug use: No   Sexual activity: Not Currently  Other Topics Concern   Not on file  Social History Narrative   Lives with husband Samantha Leriche and 8 year old son Samantha Reynolds   Husband is Technical brewer of [employer]   Social Determinants of Health   Financial Resource Strain: Low Risk  (02/26/2018)   Overall Financial Resource Strain (CARDIA)    Difficulty of Paying Living Expenses: Not hard at all  Food Insecurity: No Food Insecurity (12/05/2022)   Hunger Vital Sign    Worried About Running Out of Food in the Last Year: Never true    Ran Out of Food in the Last Year: Never true  Transportation Needs: No Transportation Needs (12/05/2022)   PRAPARE - Administrator, Civil Service (Medical): No    Lack of Transportation (Non-Medical): No  Physical Activity: Inactive (02/26/2018)   Exercise Vital Sign    Days of Exercise per Week: 0 days    Minutes of Exercise per Session: 0 min  Stress: Stress Concern Present (02/26/2018)   [employer] of Occupational Health - Occupational Stress Questionnaire    Feeling of Stress : To  some extent  Social Connections: Socially Integrated (02/26/2018)   Social Connection and Isolation  Panel [NHANES]    Frequency of Communication with Friends and Family: More than three times a week    Frequency of Social Gatherings with Friends and Family: Twice a week    Attends Religious Services: More than 4 times per year    Active Member of [employer] or Organizations: Yes    Attends Engineer, structural: More than 4 times per year    Marital Status: Married  Catering manager Violence: Not At Risk (12/05/2022)   Humiliation, Afraid, Rape, and Kick questionnaire    Fear of Current or Ex-Partner: No    Emotionally Abused: No    Physically Abused: No    Sexually Abused: No    Family History: Family History  Problem Relation Age of Onset   Hypertension Mother    Hypertension Father    Prostate cancer Father    Cancer Maternal Aunt        spine/back   Cancer Paternal Grandfather        lung   Healthy Son    Healthy Son    Healthy Son    Healthy Son    Colon cancer Neg Hx     Current Medications:  Current Outpatient Medications:    ALPRAZolam (XANAX) 0.5 MG tablet, Take 1 tablet (0.5 mg total) by mouth 3 (three) times daily as needed for anxiety., Disp: 90 tablet, Rfl: 2   amitriptyline (ELAVIL) 10 MG tablet, Take 1 tablet (10 mg total) by mouth 2 (two) times daily., Disp: 180 tablet, Rfl: 3   ARMOUR THYROID 60 MG tablet, Take 1 tablet (60 mg total) by mouth daily., Disp: 90 tablet, Rfl: 1   loperamide (IMODIUM) 2 MG capsule, Take 1 capsule (2 mg total) by mouth 3 (three) times daily. (Patient taking differently: Take 2 mg by mouth daily.), Disp: 30 capsule, Rfl: 0   prochlorperazine (COMPAZINE) 10 MG tablet, Take 1 tablet (10 mg total) by mouth every 6 (six) hours as needed for nausea or vomiting., Disp: 30 tablet, Rfl: 4 No current facility-administered medications for this visit.  Facility-Administered Medications Ordered in Other Visits:    clindamycin (CLEOCIN) 900  mg in dextrose 5 % 50 mL IVPB, 900 mg, Intravenous, 60 min Pre-Op **AND** gentamicin (GARAMYCIN) 350 mg in dextrose 5 % 50 mL IVPB, 5 mg/kg, Intravenous, 60 min Pre-Op, Romie Levee, MD   clindamycin (CLEOCIN) 900 mg in dextrose 5 % 50 mL IVPB, 900 mg, Intravenous, 60 min Pre-Op **AND** gentamicin (GARAMYCIN) 5 mg/kg in dextrose 5 % 50 mL IVPB, 5 mg/kg, Intravenous, 60 min Pre-Op, Romie Levee, MD   sodium chloride 0.9 % 1,000 mL with potassium chloride 20 mEq, magnesium sulfate 2 g infusion, , Intravenous, Once, [name], MD   Allergies: Allergies  Allergen Reactions   Oxaliplatin Other (See Comments)    Patient had hypersensitivity reaction to Oxaliplatin. Patient c/o itching in her ears and eyes. Pt also c/o being hot, flushed, dizzy with redness on pt's face, arms, and legs. Pt c/o tingling in her hands and legs. Pt also had a dry throat with coughing. See progress note from 09/07/22 at 1130. Patient was not able to complete Oxaliplatin infusion.   Second reaction 10/26/2022. Itching in ears, heart racing, & face flushing. See progress note from 10/26/2022. Able to complete infusion after medications.   Demerol [Meperidine] Itching    All over the body.   Penicillins Hives     childhood, does not remember if it spread all over the body or not. Did it  involve swelling of the face/tongue/throat, SOB, or low BP? No Did it involve sudden or severe rash/hives, skin peeling, or any reaction on the inside of your mouth or nose? Yes Did you need to seek medical attention at a hospital or doctor's office? Yes When did it last happen?      childhood allergy If all above answers are "NO", may proceed with cephalosporin use.    Levaquin [Levofloxacin] Other (See Comments)    Patient has Aortic Aneurysm and is not indicated with this diagnosis   Other Hives and Other (See Comments)    Fresh coconut    Vancomycin Rash    Will need Benadryl prior to administration per IV. "Red man  syndrome"    REVIEW OF SYSTEMS:   Review of Systems  Constitutional:  Negative for chills, fatigue and fever.  HENT:   Negative for lump/mass, mouth sores, nosebleeds, sore throat and trouble swallowing.        +rhinorrhea +rhinitis   Eyes:  Negative for eye problems.  Respiratory:  Negative for cough and shortness of breath.   Cardiovascular:  Negative for chest pain, leg swelling and palpitations.  Gastrointestinal:  Positive for constipation and diarrhea. Negative for abdominal pain, nausea and vomiting.  Genitourinary:  Negative for bladder incontinence, difficulty urinating, dysuria, frequency, hematuria and nocturia.   Musculoskeletal:  Negative for arthralgias, back pain, flank pain, myalgias and neck pain.  Skin:  Negative for itching and rash.  Neurological:  Positive for numbness (in left big toe). Negative for dizziness and headaches.  Hematological:  Bruises/bleeds easily.  Psychiatric/Behavioral:  Negative for depression, sleep disturbance and suicidal ideas. The patient is not nervous/anxious.   All other systems reviewed and are negative.    VITALS:   Blood pressure 97/75, pulse 94, temperature 98.8 F (37.1 C), temperature source Tympanic, resp. rate 19, weight 132 lb 6.4 oz (60.1 kg), last menstrual period 02/16/2015, SpO2 100%.  Wt Readings from Last 3 Encounters:  02/09/23 132 lb 6.4 oz (60.1 kg)  01/25/23 133 lb 1.6 oz (60.4 kg)  01/12/23 134 lb 6.4 oz (61 kg)    Body mass index is 21.05 kg/m.  Performance status (ECOG): 1 - Symptomatic but completely ambulatory  PHYSICAL EXAM:   Physical Exam Vitals and nursing note reviewed. Exam conducted with a chaperone present.  Constitutional:      Appearance: Normal appearance.  Cardiovascular:     Rate and Rhythm: Normal rate and regular rhythm.     Pulses: Normal pulses.     Heart sounds: Normal heart sounds.  Pulmonary:     Effort: Pulmonary effort is normal.     Breath sounds: Normal breath sounds.   Abdominal:     Palpations: Abdomen is soft. There is no hepatomegaly, splenomegaly or mass.     Tenderness: There is no abdominal tenderness.  Musculoskeletal:     Right lower leg: No edema.     Left lower leg: No edema.  Lymphadenopathy:     Cervical: No cervical adenopathy.     Right cervical: No superficial, deep or posterior cervical adenopathy.    Left cervical: No superficial, deep or posterior cervical adenopathy.     Upper Body:     Right upper body: No supraclavicular or axillary adenopathy.     Left upper body: No supraclavicular or axillary adenopathy.  Neurological:     General: No focal deficit present.     Mental Status: She is alert and oriented to person, place, and time.  Psychiatric:  Mood and Affect: Mood normal.        Behavior: Behavior normal.     LABS:      Latest Ref Rng & Units 02/09/2023    7:58 AM 01/25/2023   10:12 AM 01/04/2023    9:17 AM  CBC  WBC 4.0 - 10.5 K/uL 2.1  1.7  2.0   Hemoglobin 12.0 - 15.0 g/dL 42.7  06.2  37.6   Hematocrit 36.0 - 46.0 % 34.2  33.3  34.5   Platelets 150 - 400 K/uL 63  57  59       Latest Ref Rng & Units 02/09/2023    7:58 AM 01/25/2023   10:12 AM 01/04/2023    9:17 AM  CMP  Glucose 70 - 99 mg/dL 87  85  87   BUN 6 - 20 mg/dL 15  13  14    Creatinine 0.44 - 1.00 mg/dL 2.83  1.51  7.61   Sodium 135 - 145 mmol/L 137  136  136   Potassium 3.5 - 5.1 mmol/L 3.4  3.3  3.4   Chloride 98 - 111 mmol/L 103  104  105   CO2 22 - 32 mmol/L 23  24  21    Calcium 8.9 - 10.3 mg/dL 8.9  8.7  8.5   Total Protein 6.5 - 8.1 g/dL 6.7  6.9  6.8   Total Bilirubin <1.2 mg/dL 3.3  3.3  2.4   Alkaline Phos 38 - 126 U/L 77  79  76   AST 15 - 41 U/L 25  30  32   ALT 0 - 44 U/L 15  20  21       Lab Results  Component Value Date   CEA1 4.7 12/07/2022   /  CEA  Date Value Ref Range Status  12/07/2022 4.7 0.0 - 4.7 ng/mL Final    Comment:    (NOTE)                             Nonsmokers          <3.9                              Smokers             <5.6 Roche [employer] Electrochemiluminescence Immunoassay (ECLIA) Values obtained with different assay methods or kits cannot be used interchangeably.  Results cannot be interpreted as absolute evidence of the presence or absence of malignant disease. Performed At: [facility] [address] [place], [place] [id number] Jolene Schimke MD IR:[id number]    No results found for: "PSA1" No results found for: "CAN199" No results found for: "CAN125"  No results found for: "TOTALPROTELP", "ALBUMINELP", "A1GS", "A2GS", "BETS", "BETA2SER", "GAMS", "MSPIKE", "SPEI" Lab Results  Component Value Date   TIBC 333 09/07/2022   TIBC 364 05/11/2022   TIBC 385 11/09/2020   FERRITIN 89 09/07/2022   FERRITIN 61 05/11/2022   FERRITIN 54 11/09/2020   IRONPCTSAT 36 (H) 09/07/2022   IRONPCTSAT 26 05/11/2022   IRONPCTSAT 22 11/09/2020   Lab Results  Component Value Date   LDH 154 01/20/2020     STUDIES:   No results found.

## 2023-02-09 ENCOUNTER — Inpatient Hospital Stay: Payer: 59

## 2023-02-09 ENCOUNTER — Inpatient Hospital Stay: Payer: 59 | Admitting: Hematology

## 2023-02-09 VITALS — BP 97/75 | HR 94 | Temp 98.8°F | Resp 19 | Wt 132.4 lb

## 2023-02-09 DIAGNOSIS — C7931 Secondary malignant neoplasm of brain: Secondary | ICD-10-CM | POA: Diagnosis not present

## 2023-02-09 DIAGNOSIS — Z79631 Long term (current) use of antimetabolite agent: Secondary | ICD-10-CM | POA: Diagnosis not present

## 2023-02-09 DIAGNOSIS — C787 Secondary malignant neoplasm of liver and intrahepatic bile duct: Secondary | ICD-10-CM | POA: Diagnosis not present

## 2023-02-09 DIAGNOSIS — D696 Thrombocytopenia, unspecified: Secondary | ICD-10-CM | POA: Diagnosis not present

## 2023-02-09 DIAGNOSIS — Z5112 Encounter for antineoplastic immunotherapy: Secondary | ICD-10-CM | POA: Diagnosis not present

## 2023-02-09 DIAGNOSIS — C20 Malignant neoplasm of rectum: Secondary | ICD-10-CM

## 2023-02-09 DIAGNOSIS — D72819 Decreased white blood cell count, unspecified: Secondary | ICD-10-CM | POA: Diagnosis not present

## 2023-02-09 DIAGNOSIS — R161 Splenomegaly, not elsewhere classified: Secondary | ICD-10-CM | POA: Diagnosis not present

## 2023-02-09 DIAGNOSIS — K219 Gastro-esophageal reflux disease without esophagitis: Secondary | ICD-10-CM | POA: Diagnosis not present

## 2023-02-09 DIAGNOSIS — Z5111 Encounter for antineoplastic chemotherapy: Secondary | ICD-10-CM | POA: Diagnosis not present

## 2023-02-09 LAB — COMPREHENSIVE METABOLIC PANEL
ALT: 15 U/L (ref 0–44)
AST: 25 U/L (ref 15–41)
Albumin: 3.7 g/dL (ref 3.5–5.0)
Alkaline Phosphatase: 77 U/L (ref 38–126)
Anion gap: 11 (ref 5–15)
BUN: 15 mg/dL (ref 6–20)
CO2: 23 mmol/L (ref 22–32)
Calcium: 8.9 mg/dL (ref 8.9–10.3)
Chloride: 103 mmol/L (ref 98–111)
Creatinine, Ser: 0.82 mg/dL (ref 0.44–1.00)
GFR, Estimated: >60 mL/min (ref >60)
Glucose, Bld: 87 mg/dL (ref 70–99)
Potassium: 3.4 mmol/L — ABNORMAL LOW (ref 3.5–5.1)
Sodium: 137 mmol/L (ref 135–145)
Total Bilirubin: 3.3 mg/dL — ABNORMAL HIGH (ref <1.2)
Total Protein: 6.7 g/dL (ref 6.5–8.1)

## 2023-02-09 LAB — CBC WITH DIFFERENTIAL/PLATELET
Abs Immature Granulocytes: 0.01 10*3/uL (ref 0.00–0.07)
Basophils Absolute: 0 10*3/uL (ref 0.0–0.1)
Basophils Relative: 0 %
Eosinophils Absolute: 0 10*3/uL (ref 0.0–0.5)
Eosinophils Relative: 1 %
HCT: 34.2 % — ABNORMAL LOW (ref 36.0–46.0)
Hemoglobin: 11.3 g/dL — ABNORMAL LOW (ref 12.0–15.0)
Immature Granulocytes: 1 %
Lymphocytes Relative: 20 %
Lymphs Abs: 0.4 10*3/uL — ABNORMAL LOW (ref 0.7–4.0)
MCH: 34.6 pg — ABNORMAL HIGH (ref 26.0–34.0)
MCHC: 33 g/dL (ref 30.0–36.0)
MCV: 104.6 fL — ABNORMAL HIGH (ref 80.0–100.0)
Monocytes Absolute: 0.2 10*3/uL (ref 0.1–1.0)
Monocytes Relative: 9 %
Neutro Abs: 1.5 10*3/uL — ABNORMAL LOW (ref 1.7–7.7)
Neutrophils Relative %: 69 %
Platelets: 63 10*3/uL — ABNORMAL LOW (ref 150–400)
RBC: 3.27 MIL/uL — ABNORMAL LOW (ref 3.87–5.11)
RDW: 14.5 % (ref 11.5–15.5)
WBC: 2.1 10*3/uL — ABNORMAL LOW (ref 4.0–10.5)
nRBC: 0 % (ref 0.0–0.2)

## 2023-02-09 LAB — LIPID PANEL
Cholesterol: 166 mg/dL (ref 0–200)
HDL: 53 mg/dL (ref >40)
LDL Cholesterol: 91 mg/dL (ref 0–99)
Total CHOL/HDL Ratio: 3.1 {ratio}
Triglycerides: 111 mg/dL (ref <150)
VLDL: 22 mg/dL (ref 0–40)

## 2023-02-09 LAB — MAGNESIUM: Magnesium: 2.2 mg/dL (ref 1.7–2.4)

## 2023-02-09 LAB — TSH: TSH: 5.011 u[IU]/mL — ABNORMAL HIGH (ref 0.350–4.500)

## 2023-02-09 MED ORDER — SODIUM CHLORIDE 0.9% FLUSH
10.0000 mL | Freq: Once | INTRAVENOUS | Status: AC
  Administered 2023-02-09: 10 mL via INTRAVENOUS

## 2023-02-09 MED ORDER — HEPARIN SOD (PORK) LOCK FLUSH 100 UNIT/ML IV SOLN
500.0000 [IU] | Freq: Once | INTRAVENOUS | Status: AC
  Administered 2023-02-09: 500 [IU] via INTRAVENOUS

## 2023-02-09 NOTE — Progress Notes (Signed)
Patients port flushed without difficulty.  Good blood return noted with no bruising or swelling noted at site.  Band aid applied.  VSS with discharge and left in satisfactory condition with no s/s of distress noted.   

## 2023-02-09 NOTE — Patient Instructions (Signed)
[facility] at [facility] Discharge Instructions   You were seen and examined today by Dr. Katragadda.     Thank you for choosing [facility] at [facility] to provide your oncology and hematology care.  To afford each patient quality time with our provider, please arrive at least 15 minutes before your scheduled appointment time.   If you have a lab appointment with the Cancer Center please come in thru the Main Entrance and check in at the main information desk.  You need to re-schedule your appointment should you arrive 10 or more minutes late.  We strive to give you quality time with our providers, and arriving late affects you and other patients whose appointments are after yours.  Also, if you no show three or more times for appointments you may be dismissed from the clinic at the providers discretion.     Again, thank you for choosing [facility].  Our hope is that these requests will decrease the amount of time that you wait before being seen by our physicians.       _____________________________________________________________  Should you have questions after your visit to [facility], please contact our office at [phone] and follow the prompts.  Our office hours are 8:00 a.m. and 4:30 p.m. Monday - Friday.  Please note that voicemails left after 4:00 p.m. may not be returned until the following business day.  We are closed weekends and major holidays.  You do have access to a nurse 24-7, just call the main number to the clinic [phone] and do not press any options, hold on the line and a nurse will answer the phone.    For prescription refill requests, have your pharmacy contact our office and allow 72 hours.    Due to Covid, you will need to wear a mask upon entering the hospital. If you do not have a mask, a mask will be given to you at the Main Entrance upon arrival. For doctor visits, patients may  have 1 support person age 18 or older with them. For treatment visits, patients can not have anyone with them due to social distancing guidelines and our immunocompromised population.      

## 2023-02-10 ENCOUNTER — Other Ambulatory Visit: Payer: Self-pay

## 2023-02-10 ENCOUNTER — Inpatient Hospital Stay: Payer: 59

## 2023-02-10 LAB — CEA: CEA: 4.2 ng/mL (ref 0.0–4.7)

## 2023-02-15 ENCOUNTER — Ambulatory Visit: Payer: 59 | Admitting: Hematology

## 2023-02-15 ENCOUNTER — Inpatient Hospital Stay: Payer: 59

## 2023-02-15 VITALS — BP 102/73 | HR 84 | Temp 97.7°F | Resp 18 | Wt 133.1 lb

## 2023-02-15 DIAGNOSIS — C7931 Secondary malignant neoplasm of brain: Secondary | ICD-10-CM | POA: Diagnosis not present

## 2023-02-15 DIAGNOSIS — Z5112 Encounter for antineoplastic immunotherapy: Secondary | ICD-10-CM | POA: Diagnosis not present

## 2023-02-15 DIAGNOSIS — C20 Malignant neoplasm of rectum: Secondary | ICD-10-CM | POA: Diagnosis not present

## 2023-02-15 DIAGNOSIS — C787 Secondary malignant neoplasm of liver and intrahepatic bile duct: Secondary | ICD-10-CM | POA: Diagnosis not present

## 2023-02-15 DIAGNOSIS — D696 Thrombocytopenia, unspecified: Secondary | ICD-10-CM | POA: Diagnosis not present

## 2023-02-15 DIAGNOSIS — Z79631 Long term (current) use of antimetabolite agent: Secondary | ICD-10-CM | POA: Diagnosis not present

## 2023-02-15 DIAGNOSIS — Z5111 Encounter for antineoplastic chemotherapy: Secondary | ICD-10-CM | POA: Diagnosis not present

## 2023-02-15 DIAGNOSIS — K219 Gastro-esophageal reflux disease without esophagitis: Secondary | ICD-10-CM | POA: Diagnosis not present

## 2023-02-15 DIAGNOSIS — R161 Splenomegaly, not elsewhere classified: Secondary | ICD-10-CM | POA: Diagnosis not present

## 2023-02-15 DIAGNOSIS — D72819 Decreased white blood cell count, unspecified: Secondary | ICD-10-CM | POA: Diagnosis not present

## 2023-02-15 MED ORDER — SODIUM CHLORIDE 0.9% FLUSH
10.0000 mL | INTRAVENOUS | Status: DC | PRN
Start: 2023-02-15 — End: 2023-02-15

## 2023-02-15 MED ORDER — SODIUM CHLORIDE 0.9 % IV SOLN: 10.0000 mg | Freq: Once | INTRAVENOUS | Status: DC

## 2023-02-15 MED ORDER — HEPARIN SOD (PORK) LOCK FLUSH 100 UNIT/ML IV SOLN: 500.0000 [IU] | Freq: Once | INTRAVENOUS | Status: DC | PRN

## 2023-02-15 MED ORDER — FAMOTIDINE IN NACL 20-0.9 MG/50ML-% IV SOLN
20.0000 mg | Freq: Once | INTRAVENOUS | Status: AC
  Administered 2023-02-15: 20 mg via INTRAVENOUS
  Filled 2023-02-15: qty 50

## 2023-02-15 MED ORDER — PALONOSETRON HCL INJECTION 0.25 MG/5ML
0.2500 mg | Freq: Once | INTRAVENOUS | Status: AC
  Administered 2023-02-15: 0.25 mg via INTRAVENOUS
  Filled 2023-02-15: qty 5

## 2023-02-15 MED ORDER — SODIUM CHLORIDE 0.9 % IV SOLN
320.0000 mg/m2 | Freq: Once | INTRAVENOUS | Status: AC
  Administered 2023-02-15: 540 mg via INTRAVENOUS
  Filled 2023-02-15: qty 27

## 2023-02-15 MED ORDER — DEXAMETHASONE SODIUM PHOSPHATE 10 MG/ML IJ SOLN
10.0000 mg | Freq: Once | INTRAMUSCULAR | Status: AC
  Administered 2023-02-15: 10 mg via INTRAVENOUS
  Filled 2023-02-15: qty 1

## 2023-02-15 MED ORDER — FLUOROURACIL CHEMO INJECTION 5 GM/100ML
1920.0000 mg/m2 | INTRAVENOUS | Status: DC
  Administered 2023-02-15: 3500 mg via INTRAVENOUS
  Filled 2023-02-15: qty 70

## 2023-02-15 MED ORDER — SODIUM CHLORIDE 0.9 % IV SOLN: Freq: Once | INTRAVENOUS | Status: AC

## 2023-02-15 MED ORDER — FLUOROURACIL CHEMO INJECTION 500 MG/10ML
320.0000 mg/m2 | Freq: Once | INTRAVENOUS | Status: AC
  Administered 2023-02-15: 500 mg via INTRAVENOUS
  Filled 2023-02-15: qty 10

## 2023-02-15 MED ORDER — BEVACIZUMAB-AWWB CHEMO INJECTION 100 MG/4ML
5.0000 mg/kg | Freq: Once | INTRAVENOUS | Status: AC
  Administered 2023-02-15: 300 mg via INTRAVENOUS
  Filled 2023-02-15: qty 12

## 2023-02-15 NOTE — Patient Instructions (Signed)
[facility] - [facility]  Discharge Instructions: Thank you for choosing [facility] to provide your oncology and hematology care.  If you have a lab appointment with the Cancer Center - please note that after April 8th, 2024, all labs will be drawn in the cancer center.  You do not have to check in or register with the main entrance as you have in the past but will complete your check-in in the cancer center.  Wear comfortable clothing and clothing appropriate for easy access to any Portacath or PICC line.   We strive to give you quality time with your provider. You may need to reschedule your appointment if you arrive late (15 or more minutes).  Arriving late affects you and other patients whose appointments are after yours.  Also, if you miss three or more appointments without notifying the office, you may be dismissed from the clinic at the provider's discretion.      For prescription refill requests, have your pharmacy contact our office and allow 72 hours for refills to be completed.    Today you received the following chemotherapy and/or immunotherapy agents MVASI/Leucovorin/Fluorouracil/5FU      To help prevent nausea and vomiting after your treatment, we encourage you to take your nausea medication as directed.  BELOW ARE SYMPTOMS THAT SHOULD BE REPORTED IMMEDIATELY: *FEVER GREATER THAN 100.4 F (38 C) OR HIGHER *CHILLS OR SWEATING *NAUSEA AND VOMITING THAT IS NOT CONTROLLED WITH YOUR NAUSEA MEDICATION *UNUSUAL SHORTNESS OF BREATH *UNUSUAL BRUISING OR BLEEDING *URINARY PROBLEMS (pain or burning when urinating, or frequent urination) *BOWEL PROBLEMS (unusual diarrhea, constipation, pain near the anus) TENDERNESS IN MOUTH AND THROAT WITH OR WITHOUT PRESENCE OF ULCERS (sore throat, sores in mouth, or a toothache) UNUSUAL RASH, SWELLING OR PAIN  UNUSUAL VAGINAL DISCHARGE OR ITCHING   Items with * indicate a potential emergency  and should be followed up as soon as possible or go to the Emergency Department if any problems should occur.  Please show the CHEMOTHERAPY ALERT CARD or IMMUNOTHERAPY ALERT CARD at check-in to the Emergency Department and triage nurse.  Should you have questions after your visit or need to cancel or reschedule your appointment, please contact [facility] - A DEPT OF Eligha Bridegroom [facility] [phone]  and follow the prompts.  Office hours are 8:00 a.m. to 4:30 p.m. Monday - Friday. Please note that voicemails left after 4:00 p.m. may not be returned until the following business day.  We are closed weekends and major holidays. You have access to a nurse at all times for urgent questions. Please call the main number to the clinic [phone] and follow the prompts.  For any non-urgent questions, you may also contact your provider using MyChart. We now offer e-Visits for anyone 69 and older to request care online for non-urgent symptoms. For details visit [email or web address].   Also download the MyChart app! Go to the app store, search "MyChart", open the app, select [place], and log in with your MyChart username and password.

## 2023-02-15 NOTE — Progress Notes (Signed)
Patient presents today for MVASI/Leucovorin/Fluorouracil with 5FU pump start per providers order.  Vital signs within parameters for treatment. Platelets noted to be 65, MD notified.  Message received from Dr. Anders Simmonds to proceed with treatment.  Treatment given today per MD orders.  Stable during infusion without adverse affects.  Vital signs stable.  5FU pump connected and verified RUN on the screen with patient.  No complaints at this time.  Discharge from clinic ambulatory in stable condition.  Alert and oriented X 3.  Follow up with [facility] as scheduled.

## 2023-02-17 ENCOUNTER — Inpatient Hospital Stay: Payer: 59

## 2023-02-17 VITALS — BP 125/84 | HR 97 | Temp 97.1°F | Resp 18

## 2023-02-17 DIAGNOSIS — Z5112 Encounter for antineoplastic immunotherapy: Secondary | ICD-10-CM | POA: Diagnosis not present

## 2023-02-17 DIAGNOSIS — C7931 Secondary malignant neoplasm of brain: Secondary | ICD-10-CM | POA: Diagnosis not present

## 2023-02-17 DIAGNOSIS — Z79631 Long term (current) use of antimetabolite agent: Secondary | ICD-10-CM | POA: Diagnosis not present

## 2023-02-17 DIAGNOSIS — C20 Malignant neoplasm of rectum: Secondary | ICD-10-CM

## 2023-02-17 DIAGNOSIS — D72819 Decreased white blood cell count, unspecified: Secondary | ICD-10-CM | POA: Diagnosis not present

## 2023-02-17 DIAGNOSIS — C787 Secondary malignant neoplasm of liver and intrahepatic bile duct: Secondary | ICD-10-CM | POA: Diagnosis not present

## 2023-02-17 DIAGNOSIS — K219 Gastro-esophageal reflux disease without esophagitis: Secondary | ICD-10-CM | POA: Diagnosis not present

## 2023-02-17 DIAGNOSIS — Z5111 Encounter for antineoplastic chemotherapy: Secondary | ICD-10-CM | POA: Diagnosis not present

## 2023-02-17 DIAGNOSIS — D696 Thrombocytopenia, unspecified: Secondary | ICD-10-CM | POA: Diagnosis not present

## 2023-02-17 DIAGNOSIS — R161 Splenomegaly, not elsewhere classified: Secondary | ICD-10-CM | POA: Diagnosis not present

## 2023-02-17 MED ORDER — SODIUM CHLORIDE 0.9% FLUSH
10.0000 mL | INTRAVENOUS | Status: DC | PRN
  Administered 2023-02-17: 10 mL

## 2023-02-17 MED ORDER — HEPARIN SOD (PORK) LOCK FLUSH 100 UNIT/ML IV SOLN
500.0000 [IU] | Freq: Once | INTRAVENOUS | Status: AC | PRN
  Administered 2023-02-17: 500 [IU]

## 2023-02-17 NOTE — Progress Notes (Signed)
Patient presents today for 5FU ambulatory pump disconnection per provider's order. Vital signs stable and patient voiced no new complaints at this time. Port flushed easily without difficulty with 10 mL of normal saline and 5 mL of heparin. Good blood return noted and needle removed intact. No swelling or bruising noted at the site.  Discharged from clinic ambulatory in stable condition. Alert and oriented x 3. F/U with [facility] as scheduled.

## 2023-02-22 ENCOUNTER — Other Ambulatory Visit: Payer: Self-pay

## 2023-02-22 DIAGNOSIS — E063 Autoimmune thyroiditis: Secondary | ICD-10-CM

## 2023-03-07 DIAGNOSIS — C20 Malignant neoplasm of rectum: Secondary | ICD-10-CM | POA: Diagnosis not present

## 2023-03-07 DIAGNOSIS — C787 Secondary malignant neoplasm of liver and intrahepatic bile duct: Secondary | ICD-10-CM | POA: Diagnosis not present

## 2023-03-08 ENCOUNTER — Inpatient Hospital Stay: Payer: 59

## 2023-03-10 ENCOUNTER — Inpatient Hospital Stay: Payer: 59

## 2023-03-13 ENCOUNTER — Inpatient Hospital Stay: Payer: 59 | Attending: Hematology

## 2023-03-13 ENCOUNTER — Inpatient Hospital Stay: Payer: 59

## 2023-03-13 VITALS — BP 109/81 | HR 82 | Temp 97.9°F | Resp 18 | Wt 132.8 lb

## 2023-03-13 VITALS — BP 144/88 | HR 104 | Temp 98.3°F | Resp 16

## 2023-03-13 DIAGNOSIS — C20 Malignant neoplasm of rectum: Secondary | ICD-10-CM | POA: Insufficient documentation

## 2023-03-13 DIAGNOSIS — Z79631 Long term (current) use of antimetabolite agent: Secondary | ICD-10-CM | POA: Diagnosis not present

## 2023-03-13 DIAGNOSIS — Z5112 Encounter for antineoplastic immunotherapy: Secondary | ICD-10-CM | POA: Insufficient documentation

## 2023-03-13 DIAGNOSIS — C787 Secondary malignant neoplasm of liver and intrahepatic bile duct: Secondary | ICD-10-CM | POA: Diagnosis not present

## 2023-03-13 DIAGNOSIS — Z95828 Presence of other vascular implants and grafts: Secondary | ICD-10-CM

## 2023-03-13 DIAGNOSIS — Z79899 Other long term (current) drug therapy: Secondary | ICD-10-CM | POA: Insufficient documentation

## 2023-03-13 DIAGNOSIS — Z5111 Encounter for antineoplastic chemotherapy: Secondary | ICD-10-CM | POA: Diagnosis present

## 2023-03-13 LAB — COMPREHENSIVE METABOLIC PANEL
ALT: 13 U/L (ref 0–44)
AST: 23 U/L (ref 15–41)
Albumin: 3.7 g/dL (ref 3.5–5.0)
Alkaline Phosphatase: 96 U/L (ref 38–126)
Anion gap: 11 (ref 5–15)
BUN: 11 mg/dL (ref 6–20)
CO2: 22 mmol/L (ref 22–32)
Calcium: 8.8 mg/dL — ABNORMAL LOW (ref 8.9–10.3)
Chloride: 102 mmol/L (ref 98–111)
Creatinine, Ser: 0.73 mg/dL (ref 0.44–1.00)
GFR, Estimated: >60 mL/min (ref >60)
Glucose, Bld: 86 mg/dL (ref 70–99)
Potassium: 3.7 mmol/L (ref 3.5–5.1)
Sodium: 135 mmol/L (ref 135–145)
Total Bilirubin: 3.3 mg/dL — ABNORMAL HIGH (ref <1.2)
Total Protein: 6.8 g/dL (ref 6.5–8.1)

## 2023-03-13 LAB — URINALYSIS, DIPSTICK ONLY
Bilirubin Urine: NEGATIVE
Glucose, UA: NEGATIVE mg/dL
Hgb urine dipstick: NEGATIVE
Ketones, ur: NEGATIVE mg/dL
Leukocytes,Ua: NEGATIVE
Nitrite: NEGATIVE
Protein, ur: NEGATIVE mg/dL
Specific Gravity, Urine: 1.013 (ref 1.005–1.030)
pH: 6 (ref 5.0–8.0)

## 2023-03-13 LAB — CBC WITH DIFFERENTIAL/PLATELET
Abs Immature Granulocytes: 0.01 10*3/uL (ref 0.00–0.07)
Basophils Absolute: 0 10*3/uL (ref 0.0–0.1)
Basophils Relative: 0 %
Eosinophils Absolute: 0 10*3/uL (ref 0.0–0.5)
Eosinophils Relative: 0 %
HCT: 34.4 % — ABNORMAL LOW (ref 36.0–46.0)
Hemoglobin: 11.7 g/dL — ABNORMAL LOW (ref 12.0–15.0)
Immature Granulocytes: 0 %
Lymphocytes Relative: 12 %
Lymphs Abs: 0.5 10*3/uL — ABNORMAL LOW (ref 0.7–4.0)
MCH: 35.5 pg — ABNORMAL HIGH (ref 26.0–34.0)
MCHC: 34 g/dL (ref 30.0–36.0)
MCV: 104.2 fL — ABNORMAL HIGH (ref 80.0–100.0)
Monocytes Absolute: 0.4 10*3/uL (ref 0.1–1.0)
Monocytes Relative: 10 %
Neutro Abs: 3.1 10*3/uL (ref 1.7–7.7)
Neutrophils Relative %: 78 %
Platelets: 56 10*3/uL — ABNORMAL LOW (ref 150–400)
RBC: 3.3 MIL/uL — ABNORMAL LOW (ref 3.87–5.11)
RDW: 13.8 % (ref 11.5–15.5)
WBC: 3.9 10*3/uL — ABNORMAL LOW (ref 4.0–10.5)
nRBC: 0 % (ref 0.0–0.2)

## 2023-03-13 LAB — MAGNESIUM: Magnesium: 2.1 mg/dL (ref 1.7–2.4)

## 2023-03-13 MED ORDER — BEVACIZUMAB-AWWB CHEMO INJECTION 400 MG/16ML
5.0000 mg/kg | Freq: Once | INTRAVENOUS | Status: AC
  Administered 2023-03-13: 300 mg via INTRAVENOUS
  Filled 2023-03-13: qty 12

## 2023-03-13 MED ORDER — PALONOSETRON HCL INJECTION 0.25 MG/5ML
0.2500 mg | Freq: Once | INTRAVENOUS | Status: AC
  Administered 2023-03-13: 0.25 mg via INTRAVENOUS
  Filled 2023-03-13: qty 5

## 2023-03-13 MED ORDER — DEXAMETHASONE SODIUM PHOSPHATE 100 MG/10ML IJ SOLN: 10.0000 mg | Freq: Once | INTRAMUSCULAR | Status: DC

## 2023-03-13 MED ORDER — LEUCOVORIN CALCIUM INJECTION 350 MG
320.0000 mg/m2 | Freq: Once | INTRAMUSCULAR | Status: AC
  Administered 2023-03-13: 540 mg via INTRAVENOUS
  Filled 2023-03-13: qty 27

## 2023-03-13 MED ORDER — SODIUM CHLORIDE 0.9 % IV SOLN
1920.0000 mg/m2 | INTRAVENOUS | Status: DC
  Administered 2023-03-13: 3500 mg via INTRAVENOUS
  Filled 2023-03-13: qty 70

## 2023-03-13 MED ORDER — SODIUM CHLORIDE 0.9 % IV SOLN: Freq: Once | INTRAVENOUS | Status: AC

## 2023-03-13 MED ORDER — DEXAMETHASONE SODIUM PHOSPHATE 10 MG/ML IJ SOLN
10.0000 mg | Freq: Once | INTRAMUSCULAR | Status: AC
  Administered 2023-03-13: 10 mg via INTRAVENOUS
  Filled 2023-03-13: qty 1

## 2023-03-13 MED ORDER — FAMOTIDINE IN NACL 20-0.9 MG/50ML-% IV SOLN
20.0000 mg | Freq: Once | INTRAVENOUS | Status: AC
Start: 2023-03-13 — End: 2023-03-13
  Administered 2023-03-13: 20 mg via INTRAVENOUS
  Filled 2023-03-13: qty 50

## 2023-03-13 MED ORDER — FLUOROURACIL CHEMO INJECTION 500 MG/10ML
320.0000 mg/m2 | Freq: Once | INTRAVENOUS | Status: AC
Start: 2023-03-13 — End: 2023-03-13
  Administered 2023-03-13: 500 mg via INTRAVENOUS
  Filled 2023-03-13: qty 10

## 2023-03-13 MED ORDER — SODIUM CHLORIDE 0.9% FLUSH
10.0000 mL | INTRAVENOUS | Status: DC | PRN
  Administered 2023-03-13: 10 mL via INTRAVENOUS

## 2023-03-13 NOTE — Progress Notes (Signed)
Patients port flushed without difficulty.  Good blood return noted with no bruising or swelling noted at site. Patient remains accessed for treatment. Patient did report some intermittent swelling to left side of neck that started last week during her cruise. Denies any other symptoms. Per patient mainly swells with bread or sour foods. No active swelling noted. Dr Ellin Saba made aware. No orders at this time. If labs are WNL patient can have treatment per Dr Ellin Saba.

## 2023-03-13 NOTE — Progress Notes (Signed)
Patient presents today for MVASI/Leucovorin/5FU infusion.  Patient is in satisfactory condition with no new complaints voiced.  Vital signs are stable.  Labs reviewed and all other labs are within treatment parameters. Patient's platelets noted to be 56. Dr.K made aware. We will proceed with treatment per MD orders.    Treatment given today per MD orders. Tolerated infusion without adverse affects. Vital signs stable. No complaints at this time. Discharged from clinic ambulatory in stable condition. Alert and oriented x 3. F/U with [facility] as scheduled. 5FU ambulatory pump infusing with no alarms beeping.

## 2023-03-13 NOTE — Patient Instructions (Signed)
[facility] [place] [facility]  Discharge Instructions: Thank you for choosing [facility] to provide your oncology and hematology care.  If you have a lab appointment with the Cancer Center - please note that after April 8th, 2024, all labs will be drawn in the cancer center.  You do not have to check in or register with the main entrance as you have in the past but will complete your check-in in the cancer center.  Wear comfortable clothing and clothing appropriate for easy access to any Portacath or PICC line.   We strive to give you quality time with your provider. You may need to reschedule your appointment if you arrive late (15 or more minutes).  Arriving late affects you and other patients whose appointments are after yours.  Also, if you miss three or more appointments without notifying the office, you may be dismissed from the clinic at the provider's discretion.      For prescription refill requests, have your pharmacy contact our office and allow 72 hours for refills to be completed.    Today you received the following chemotherapy and/or immunotherapy agents MVASI/Leucovorin/ and 5FU   To help prevent nausea and vomiting after your treatment, we encourage you to take your nausea medication as directed.  BELOW ARE SYMPTOMS THAT SHOULD BE REPORTED IMMEDIATELY: *FEVER GREATER THAN 100.4 F (38 C) OR HIGHER *CHILLS OR SWEATING *NAUSEA AND VOMITING THAT IS NOT CONTROLLED WITH YOUR NAUSEA MEDICATION *UNUSUAL SHORTNESS OF BREATH *UNUSUAL BRUISING OR BLEEDING *URINARY PROBLEMS (pain or burning when urinating, or frequent urination) *BOWEL PROBLEMS (unusual diarrhea, constipation, pain near the anus) TENDERNESS IN MOUTH AND THROAT WITH OR WITHOUT PRESENCE OF ULCERS (sore throat, sores in mouth, or a toothache) UNUSUAL RASH, SWELLING OR PAIN  UNUSUAL VAGINAL DISCHARGE OR ITCHING   Items with * indicate a potential emergency and should  be followed up as soon as possible or go to the Emergency Department if any problems should occur.  Please show the CHEMOTHERAPY ALERT CARD or IMMUNOTHERAPY ALERT CARD at check-in to the Emergency Department and triage nurse.  Should you have questions after your visit or need to cancel or reschedule your appointment, please contact [facility] [place] - A DEPT OF [facility] Brideg[facility] [phone]  and follow the prompts.  Office hours are 8:00 a.m. to 4:30 p.m. Monday - Friday. Please note that voicemails left after 4:00 p.m. may not be returned until the following business day.  We are closed weekends and major holidays. You have access to a nurse at all times for urgent questions. Please call the main number to the clinic [phone] and follow the prompts.  For any non-urgent questions, you may also contact your provider using MyChart. We now offer e-Visits for anyone 46 and older to request care online for non-urgent symptoms. For details visit [email or web address].   Also download the MyChart app! Go to the app store, search "MyChart", open the app, select [place], and log in with your MyChart username and password.

## 2023-03-14 ENCOUNTER — Other Ambulatory Visit: Payer: 59

## 2023-03-14 ENCOUNTER — Ambulatory Visit: Payer: 59

## 2023-03-14 LAB — CEA: CEA: 3.8 ng/mL (ref 0.0–4.7)

## 2023-03-15 ENCOUNTER — Inpatient Hospital Stay: Payer: 59

## 2023-03-15 VITALS — BP 121/85 | HR 102 | Temp 97.4°F | Resp 18

## 2023-03-15 DIAGNOSIS — Z5112 Encounter for antineoplastic immunotherapy: Secondary | ICD-10-CM | POA: Diagnosis not present

## 2023-03-15 DIAGNOSIS — C20 Malignant neoplasm of rectum: Secondary | ICD-10-CM

## 2023-03-15 MED ORDER — HEPARIN SOD (PORK) LOCK FLUSH 100 UNIT/ML IV SOLN
500.0000 [IU] | Freq: Once | INTRAVENOUS | Status: AC | PRN
Start: 2023-03-15 — End: 2023-03-15
  Administered 2023-03-15: 500 [IU]

## 2023-03-15 MED ORDER — SODIUM CHLORIDE 0.9% FLUSH
10.0000 mL | INTRAVENOUS | Status: DC | PRN
  Administered 2023-03-15: 10 mL

## 2023-03-15 NOTE — Progress Notes (Signed)
Patient presents today for 5FU pump stop and disconnection after 46 hour continous infusion.   5FU pump deaccessed.  Patients port flushed without difficulty.  Good blood return noted with no bruising or swelling noted at site.  Needle removed intact.  Band aid applied.  VSS with discharge and left in satisfactory condition with no s/s of distress noted.

## 2023-03-23 ENCOUNTER — Other Ambulatory Visit (HOSPITAL_COMMUNITY): Payer: Self-pay

## 2023-03-23 ENCOUNTER — Other Ambulatory Visit: Payer: Self-pay | Admitting: Family Medicine

## 2023-03-23 MED ORDER — ALPRAZOLAM 0.5 MG PO TABS
0.5000 mg | ORAL_TABLET | Freq: Three times a day (TID) | ORAL | 3 refills | Status: DC | PRN
  Filled 2023-03-23: qty 90, 30d supply, fill #0
  Filled 2023-05-03: qty 90, 30d supply, fill #1
  Filled 2023-06-18: qty 90, 30d supply, fill #2
  Filled 2023-08-02: qty 90, 30d supply, fill #3

## 2023-03-24 ENCOUNTER — Ambulatory Visit (HOSPITAL_COMMUNITY)
Admission: RE | Admit: 2023-03-24 | Discharge: 2023-03-24 | Disposition: A | Discharge status: Home / Self Care | Payer: 59 | Source: Ambulatory Visit | Attending: Radiation Oncology | Admitting: Radiation Oncology

## 2023-03-24 DIAGNOSIS — C801 Malignant (primary) neoplasm, unspecified: Secondary | ICD-10-CM | POA: Diagnosis not present

## 2023-03-24 DIAGNOSIS — R9082 White matter disease, unspecified: Secondary | ICD-10-CM | POA: Diagnosis not present

## 2023-03-24 DIAGNOSIS — C7931 Secondary malignant neoplasm of brain: Secondary | ICD-10-CM | POA: Insufficient documentation

## 2023-03-24 MED ORDER — GADOBUTROL 1 MMOL/ML IV SOLN
6.0000 mL | Freq: Once | INTRAVENOUS | Status: AC | PRN
  Administered 2023-03-24: 6 mL via INTRAVENOUS

## 2023-03-27 ENCOUNTER — Other Ambulatory Visit: Payer: Self-pay | Admitting: *Deleted

## 2023-03-27 ENCOUNTER — Inpatient Hospital Stay: Payer: 59

## 2023-03-27 ENCOUNTER — Ambulatory Visit: Payer: 59 | Admitting: Radiation Oncology

## 2023-03-27 ENCOUNTER — Telehealth: Payer: Self-pay | Admitting: *Deleted

## 2023-03-27 DIAGNOSIS — R300 Dysuria: Secondary | ICD-10-CM

## 2023-03-27 DIAGNOSIS — N390 Urinary tract infection, site not specified: Secondary | ICD-10-CM

## 2023-03-27 DIAGNOSIS — Z5112 Encounter for antineoplastic immunotherapy: Secondary | ICD-10-CM | POA: Diagnosis not present

## 2023-03-27 LAB — URINALYSIS, ROUTINE W REFLEX MICROSCOPIC
Bilirubin Urine: NEGATIVE
Glucose, UA: NEGATIVE mg/dL
Ketones, ur: NEGATIVE mg/dL
Nitrite: NEGATIVE
Protein, ur: 100 mg/dL — AB
RBC / HPF: >50 RBC/hpf (ref 0–5)
Specific Gravity, Urine: 1.014 (ref 1.005–1.030)
WBC, UA: >50 WBC/hpf (ref 0–5)
pH: 5 (ref 5.0–8.0)

## 2023-03-27 MED ORDER — NITROFURANTOIN MONOHYD MACRO 100 MG PO CAPS: 100.0000 mg | ORAL_CAPSULE | Freq: Two times a day (BID) | ORAL | 0 refills | Status: AC

## 2023-03-27 NOTE — Telephone Encounter (Signed)
Received [facility] and telephone call with c/o symptoms of UTI, to include; burning, pelvic pain and odor.  Will come in this morning for urinalysis with reflex.  Dr. Ellin Saba made aware.

## 2023-03-27 NOTE — Telephone Encounter (Signed)
Macrobid 100 mg BID sent to pharmacy per Dr. Marice Potter recommendations for probable UTI.

## 2023-04-03 ENCOUNTER — Telehealth: Payer: Self-pay | Admitting: *Deleted

## 2023-04-03 ENCOUNTER — Other Ambulatory Visit: Payer: Self-pay | Admitting: *Deleted

## 2023-04-03 ENCOUNTER — Inpatient Hospital Stay: Payer: 59 | Attending: Hematology | Admitting: Hematology

## 2023-04-03 DIAGNOSIS — Z808 Family history of malignant neoplasm of other organs or systems: Secondary | ICD-10-CM | POA: Insufficient documentation

## 2023-04-03 DIAGNOSIS — Z9049 Acquired absence of other specified parts of digestive tract: Secondary | ICD-10-CM | POA: Diagnosis not present

## 2023-04-03 DIAGNOSIS — Z880 Allergy status to penicillin: Secondary | ICD-10-CM | POA: Diagnosis not present

## 2023-04-03 DIAGNOSIS — C7971 Secondary malignant neoplasm of right adrenal gland: Secondary | ICD-10-CM | POA: Insufficient documentation

## 2023-04-03 DIAGNOSIS — Z79890 Hormone replacement therapy (postmenopausal): Secondary | ICD-10-CM | POA: Insufficient documentation

## 2023-04-03 DIAGNOSIS — D696 Thrombocytopenia, unspecified: Secondary | ICD-10-CM | POA: Diagnosis not present

## 2023-04-03 DIAGNOSIS — Z881 Allergy status to other antibiotic agents status: Secondary | ICD-10-CM | POA: Insufficient documentation

## 2023-04-03 DIAGNOSIS — Z8249 Family history of ischemic heart disease and other diseases of the circulatory system: Secondary | ICD-10-CM | POA: Insufficient documentation

## 2023-04-03 DIAGNOSIS — I700 Atherosclerosis of aorta: Secondary | ICD-10-CM | POA: Insufficient documentation

## 2023-04-03 DIAGNOSIS — Z5112 Encounter for antineoplastic immunotherapy: Secondary | ICD-10-CM | POA: Diagnosis not present

## 2023-04-03 DIAGNOSIS — J984 Other disorders of lung: Secondary | ICD-10-CM | POA: Diagnosis not present

## 2023-04-03 DIAGNOSIS — C787 Secondary malignant neoplasm of liver and intrahepatic bile duct: Secondary | ICD-10-CM | POA: Diagnosis not present

## 2023-04-03 DIAGNOSIS — Z79631 Long term (current) use of antimetabolite agent: Secondary | ICD-10-CM | POA: Diagnosis not present

## 2023-04-03 DIAGNOSIS — Z85850 Personal history of malignant neoplasm of thyroid: Secondary | ICD-10-CM | POA: Insufficient documentation

## 2023-04-03 DIAGNOSIS — Z885 Allergy status to narcotic agent status: Secondary | ICD-10-CM | POA: Insufficient documentation

## 2023-04-03 DIAGNOSIS — Z5111 Encounter for antineoplastic chemotherapy: Secondary | ICD-10-CM | POA: Insufficient documentation

## 2023-04-03 DIAGNOSIS — Z87891 Personal history of nicotine dependence: Secondary | ICD-10-CM | POA: Diagnosis not present

## 2023-04-03 DIAGNOSIS — Z923 Personal history of irradiation: Secondary | ICD-10-CM | POA: Insufficient documentation

## 2023-04-03 DIAGNOSIS — D72819 Decreased white blood cell count, unspecified: Secondary | ICD-10-CM | POA: Insufficient documentation

## 2023-04-03 DIAGNOSIS — C20 Malignant neoplasm of rectum: Secondary | ICD-10-CM | POA: Diagnosis not present

## 2023-04-03 DIAGNOSIS — Z8042 Family history of malignant neoplasm of prostate: Secondary | ICD-10-CM | POA: Insufficient documentation

## 2023-04-03 DIAGNOSIS — Z79899 Other long term (current) drug therapy: Secondary | ICD-10-CM | POA: Diagnosis not present

## 2023-04-03 DIAGNOSIS — C7931 Secondary malignant neoplasm of brain: Secondary | ICD-10-CM | POA: Insufficient documentation

## 2023-04-03 DIAGNOSIS — Z9221 Personal history of antineoplastic chemotherapy: Secondary | ICD-10-CM | POA: Diagnosis not present

## 2023-04-03 DIAGNOSIS — R300 Dysuria: Secondary | ICD-10-CM

## 2023-04-03 DIAGNOSIS — I7121 Aneurysm of the ascending aorta, without rupture: Secondary | ICD-10-CM | POA: Diagnosis not present

## 2023-04-03 DIAGNOSIS — R5383 Other fatigue: Secondary | ICD-10-CM | POA: Diagnosis not present

## 2023-04-03 DIAGNOSIS — Z801 Family history of malignant neoplasm of trachea, bronchus and lung: Secondary | ICD-10-CM | POA: Insufficient documentation

## 2023-04-03 LAB — URINALYSIS, DIPSTICK ONLY
Bilirubin Urine: NEGATIVE
Glucose, UA: NEGATIVE mg/dL
Hgb urine dipstick: NEGATIVE
Ketones, ur: NEGATIVE mg/dL
Nitrite: NEGATIVE
Protein, ur: NEGATIVE mg/dL
Specific Gravity, Urine: 1.009 (ref 1.005–1.030)
pH: 5 (ref 5.0–8.0)

## 2023-04-03 NOTE — Telephone Encounter (Signed)
 Patient called to advise that since finishing Macrobid, she has begun having recurrence of burning and urgency.  UA was ordered with reflex, however was not performed.  Per Dr. Elbert Ewings, will need to repeat specimen.  Patient made aware.

## 2023-04-04 ENCOUNTER — Ambulatory Visit (HOSPITAL_COMMUNITY): Payer: 59

## 2023-04-04 NOTE — Progress Notes (Signed)
 Per Dr. Ellin Saba, urine improved after completion of Macrodantin.  Patient continues to have symptoms of pressure, pain and urgency.  Per Dr. Ellin Saba, advised to continue AZO for symptom management.

## 2023-04-05 ENCOUNTER — Other Ambulatory Visit: Payer: Self-pay

## 2023-04-05 ENCOUNTER — Inpatient Hospital Stay: Payer: 59

## 2023-04-05 ENCOUNTER — Encounter: Payer: Self-pay | Admitting: Hematology

## 2023-04-05 ENCOUNTER — Encounter (HOSPITAL_COMMUNITY): Payer: Self-pay | Admitting: Hematology

## 2023-04-05 VITALS — BP 108/77 | HR 85 | Temp 97.0°F | Resp 18 | Wt 130.4 lb

## 2023-04-05 DIAGNOSIS — D696 Thrombocytopenia, unspecified: Secondary | ICD-10-CM | POA: Diagnosis not present

## 2023-04-05 DIAGNOSIS — C7931 Secondary malignant neoplasm of brain: Secondary | ICD-10-CM | POA: Diagnosis not present

## 2023-04-05 DIAGNOSIS — C20 Malignant neoplasm of rectum: Secondary | ICD-10-CM

## 2023-04-05 DIAGNOSIS — C7971 Secondary malignant neoplasm of right adrenal gland: Secondary | ICD-10-CM | POA: Diagnosis not present

## 2023-04-05 DIAGNOSIS — C787 Secondary malignant neoplasm of liver and intrahepatic bile duct: Secondary | ICD-10-CM | POA: Diagnosis not present

## 2023-04-05 DIAGNOSIS — Z5111 Encounter for antineoplastic chemotherapy: Secondary | ICD-10-CM | POA: Diagnosis not present

## 2023-04-05 DIAGNOSIS — Z5112 Encounter for antineoplastic immunotherapy: Secondary | ICD-10-CM | POA: Diagnosis not present

## 2023-04-05 DIAGNOSIS — D72819 Decreased white blood cell count, unspecified: Secondary | ICD-10-CM | POA: Diagnosis not present

## 2023-04-05 DIAGNOSIS — R5383 Other fatigue: Secondary | ICD-10-CM | POA: Diagnosis not present

## 2023-04-05 DIAGNOSIS — I700 Atherosclerosis of aorta: Secondary | ICD-10-CM | POA: Diagnosis not present

## 2023-04-05 LAB — CBC WITH DIFFERENTIAL/PLATELET
Abs Immature Granulocytes: 0.01 10*3/uL (ref 0.00–0.07)
Basophils Absolute: 0 10*3/uL (ref 0.0–0.1)
Basophils Relative: 0 %
Eosinophils Absolute: 0 10*3/uL (ref 0.0–0.5)
Eosinophils Relative: 1 %
HCT: 34.4 % — ABNORMAL LOW (ref 36.0–46.0)
Hemoglobin: 11.6 g/dL — ABNORMAL LOW (ref 12.0–15.0)
Immature Granulocytes: 1 %
Lymphocytes Relative: 21 %
Lymphs Abs: 0.4 10*3/uL — ABNORMAL LOW (ref 0.7–4.0)
MCH: 34.8 pg — ABNORMAL HIGH (ref 26.0–34.0)
MCHC: 33.7 g/dL (ref 30.0–36.0)
MCV: 103.3 fL — ABNORMAL HIGH (ref 80.0–100.0)
Monocytes Absolute: 0.2 10*3/uL (ref 0.1–1.0)
Monocytes Relative: 10 %
Neutro Abs: 1.4 10*3/uL — ABNORMAL LOW (ref 1.7–7.7)
Neutrophils Relative %: 67 %
Platelets: 73 10*3/uL — ABNORMAL LOW (ref 150–400)
RBC: 3.33 MIL/uL — ABNORMAL LOW (ref 3.87–5.11)
RDW: 14.2 % (ref 11.5–15.5)
WBC: 2.1 10*3/uL — ABNORMAL LOW (ref 4.0–10.5)
nRBC: 0 % (ref 0.0–0.2)

## 2023-04-05 LAB — COMPREHENSIVE METABOLIC PANEL
ALT: 15 U/L (ref 0–44)
AST: 31 U/L (ref 15–41)
Albumin: 3.5 g/dL (ref 3.5–5.0)
Alkaline Phosphatase: 112 U/L (ref 38–126)
Anion gap: 6 (ref 5–15)
BUN: 10 mg/dL (ref 6–20)
CO2: 25 mmol/L (ref 22–32)
Calcium: 8.7 mg/dL — ABNORMAL LOW (ref 8.9–10.3)
Chloride: 105 mmol/L (ref 98–111)
Creatinine, Ser: 0.78 mg/dL (ref 0.44–1.00)
GFR, Estimated: >60 mL/min (ref >60)
Glucose, Bld: 90 mg/dL (ref 70–99)
Potassium: 3.6 mmol/L (ref 3.5–5.1)
Sodium: 136 mmol/L (ref 135–145)
Total Bilirubin: 1.6 mg/dL — ABNORMAL HIGH (ref 0.0–1.2)
Total Protein: 6.7 g/dL (ref 6.5–8.1)

## 2023-04-05 LAB — MAGNESIUM: Magnesium: 2.2 mg/dL (ref 1.7–2.4)

## 2023-04-05 MED ORDER — SODIUM CHLORIDE 0.9 % IV SOLN
5.0000 mg/kg | Freq: Once | INTRAVENOUS | Status: AC
  Administered 2023-04-05: 300 mg via INTRAVENOUS
  Filled 2023-04-05: qty 12

## 2023-04-05 MED ORDER — FAMOTIDINE IN NACL 20-0.9 MG/50ML-% IV SOLN
20.0000 mg | Freq: Once | INTRAVENOUS | Status: AC
  Administered 2023-04-05: 20 mg via INTRAVENOUS
  Filled 2023-04-05: qty 50

## 2023-04-05 MED ORDER — SODIUM CHLORIDE 0.9 % IV SOLN
320.0000 mg/m2 | Freq: Once | INTRAVENOUS | Status: AC
  Administered 2023-04-05: 540 mg via INTRAVENOUS
  Filled 2023-04-05: qty 27

## 2023-04-05 MED ORDER — SODIUM CHLORIDE 0.9% FLUSH: 10.0000 mL | INTRAVENOUS | Status: DC | PRN

## 2023-04-05 MED ORDER — PALONOSETRON HCL INJECTION 0.25 MG/5ML
0.2500 mg | Freq: Once | INTRAVENOUS | Status: AC
  Administered 2023-04-05: 0.25 mg via INTRAVENOUS
  Filled 2023-04-05: qty 5

## 2023-04-05 MED ORDER — SODIUM CHLORIDE 0.9 % IV SOLN
1920.0000 mg/m2 | INTRAVENOUS | Status: DC
  Administered 2023-04-05: 3500 mg via INTRAVENOUS
  Filled 2023-04-05: qty 70

## 2023-04-05 MED ORDER — SODIUM CHLORIDE 0.9% FLUSH
10.0000 mL | INTRAVENOUS | Status: DC | PRN
  Administered 2023-04-05: 10 mL via INTRAVENOUS

## 2023-04-05 MED ORDER — SODIUM CHLORIDE 0.9 % IV SOLN: Freq: Once | INTRAVENOUS | Status: AC

## 2023-04-05 MED ORDER — FLUOROURACIL CHEMO INJECTION 500 MG/10ML
320.0000 mg/m2 | Freq: Once | INTRAVENOUS | Status: AC
Start: 2023-04-05 — End: 2023-04-05
  Administered 2023-04-05: 500 mg via INTRAVENOUS
  Filled 2023-04-05: qty 10

## 2023-04-05 MED ORDER — DEXAMETHASONE SODIUM PHOSPHATE 10 MG/ML IJ SOLN
10.0000 mg | Freq: Once | INTRAMUSCULAR | Status: AC
  Administered 2023-04-05: 10 mg via INTRAVENOUS
  Filled 2023-04-05: qty 1

## 2023-04-05 NOTE — Patient Instructions (Signed)

## 2023-04-05 NOTE — Progress Notes (Signed)
 Total  bili 1.6, Platelets 73, and ANC 1.4 for today's treatment.  Oncologist notified.  Ok to treat verbal order Dr .Rogers.   Patient completed antibiotic Monday for UTI and no symptoms reported for today's treatment.    Patient tolerated chemotherapy with no complaints voiced.  Side effects with management reviewed with understanding verbalized.  Port site clean and dry with no bruising or swelling noted at site.  Good blood return noted before and after administration of chemotherapy.  Chemotherapy pump connected with no alarms noted.   Patient left in satisfactory condition with VSS and no s/s of distress noted.

## 2023-04-07 ENCOUNTER — Inpatient Hospital Stay: Payer: 59

## 2023-04-07 VITALS — BP 118/77 | HR 92 | Temp 97.9°F

## 2023-04-07 DIAGNOSIS — C7931 Secondary malignant neoplasm of brain: Secondary | ICD-10-CM | POA: Diagnosis not present

## 2023-04-07 DIAGNOSIS — D696 Thrombocytopenia, unspecified: Secondary | ICD-10-CM | POA: Diagnosis not present

## 2023-04-07 DIAGNOSIS — I700 Atherosclerosis of aorta: Secondary | ICD-10-CM | POA: Diagnosis not present

## 2023-04-07 DIAGNOSIS — C7971 Secondary malignant neoplasm of right adrenal gland: Secondary | ICD-10-CM | POA: Diagnosis not present

## 2023-04-07 DIAGNOSIS — Z5112 Encounter for antineoplastic immunotherapy: Secondary | ICD-10-CM | POA: Diagnosis not present

## 2023-04-07 DIAGNOSIS — Z5111 Encounter for antineoplastic chemotherapy: Secondary | ICD-10-CM | POA: Diagnosis not present

## 2023-04-07 DIAGNOSIS — C20 Malignant neoplasm of rectum: Secondary | ICD-10-CM

## 2023-04-07 DIAGNOSIS — R5383 Other fatigue: Secondary | ICD-10-CM | POA: Diagnosis not present

## 2023-04-07 DIAGNOSIS — D72819 Decreased white blood cell count, unspecified: Secondary | ICD-10-CM | POA: Diagnosis not present

## 2023-04-07 DIAGNOSIS — C787 Secondary malignant neoplasm of liver and intrahepatic bile duct: Secondary | ICD-10-CM | POA: Diagnosis not present

## 2023-04-07 MED ORDER — HEPARIN SOD (PORK) LOCK FLUSH 100 UNIT/ML IV SOLN
500.0000 [IU] | Freq: Once | INTRAVENOUS | Status: AC | PRN
  Administered 2023-04-07: 500 [IU]

## 2023-04-07 MED ORDER — SODIUM CHLORIDE 0.9% FLUSH
10.0000 mL | INTRAVENOUS | Status: DC | PRN
  Administered 2023-04-07: 10 mL

## 2023-04-07 NOTE — Progress Notes (Signed)
Patients port flushed without difficulty.  Good blood return noted with no bruising or swelling noted at site.  Band aid applied.  Home infusion 5FU pump disconnected.  VSS with discharge and left in satisfactory condition with no s/s of distress noted.   

## 2023-04-07 NOTE — Patient Instructions (Signed)

## 2023-04-10 ENCOUNTER — Telehealth: Payer: Self-pay | Admitting: *Deleted

## 2023-04-10 ENCOUNTER — Inpatient Hospital Stay: Payer: 59

## 2023-04-10 ENCOUNTER — Ambulatory Visit: Payer: 59 | Admitting: Radiation Oncology

## 2023-04-10 ENCOUNTER — Other Ambulatory Visit: Payer: Self-pay | Admitting: *Deleted

## 2023-04-10 ENCOUNTER — Telehealth: Payer: Self-pay | Admitting: Radiation Oncology

## 2023-04-10 DIAGNOSIS — Z5111 Encounter for antineoplastic chemotherapy: Secondary | ICD-10-CM | POA: Diagnosis not present

## 2023-04-10 DIAGNOSIS — I700 Atherosclerosis of aorta: Secondary | ICD-10-CM | POA: Diagnosis not present

## 2023-04-10 DIAGNOSIS — Z5112 Encounter for antineoplastic immunotherapy: Secondary | ICD-10-CM | POA: Diagnosis not present

## 2023-04-10 DIAGNOSIS — C20 Malignant neoplasm of rectum: Secondary | ICD-10-CM | POA: Diagnosis not present

## 2023-04-10 DIAGNOSIS — C7971 Secondary malignant neoplasm of right adrenal gland: Secondary | ICD-10-CM | POA: Diagnosis not present

## 2023-04-10 DIAGNOSIS — R300 Dysuria: Secondary | ICD-10-CM

## 2023-04-10 DIAGNOSIS — D72819 Decreased white blood cell count, unspecified: Secondary | ICD-10-CM | POA: Diagnosis not present

## 2023-04-10 DIAGNOSIS — R5383 Other fatigue: Secondary | ICD-10-CM | POA: Diagnosis not present

## 2023-04-10 DIAGNOSIS — C7931 Secondary malignant neoplasm of brain: Secondary | ICD-10-CM | POA: Diagnosis not present

## 2023-04-10 DIAGNOSIS — D696 Thrombocytopenia, unspecified: Secondary | ICD-10-CM | POA: Diagnosis not present

## 2023-04-10 DIAGNOSIS — C787 Secondary malignant neoplasm of liver and intrahepatic bile duct: Secondary | ICD-10-CM | POA: Diagnosis not present

## 2023-04-10 LAB — URINALYSIS, ROUTINE W REFLEX MICROSCOPIC
Bilirubin Urine: NEGATIVE
Glucose, UA: NEGATIVE mg/dL
Ketones, ur: NEGATIVE mg/dL
Nitrite: POSITIVE — AB
Protein, ur: NEGATIVE mg/dL
Specific Gravity, Urine: 1.01 (ref 1.005–1.030)
WBC, UA: >50 WBC/hpf (ref 0–5)
pH: 7 (ref 5.0–8.0)

## 2023-04-10 NOTE — Telephone Encounter (Signed)
 Patient is still exhibiting signs of UTI to include; urgency and burning.  Per Dr. Ellin Saba, will bring in for UA with reflex.  Patient aware and appt made for today.

## 2023-04-10 NOTE — Telephone Encounter (Signed)
 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 She acknowledges a syncopal episode when she was 16 and fullness in that area since. [facility], but did not consider this again until we've reviewed this today. She will continue her next treatment and upcoming PET and might be able to take a break from systemic therapy for a while. We will continue with q3 month MRI scans. We will cancel her appt tomorrow to review this.

## 2023-04-11 ENCOUNTER — Ambulatory Visit: Payer: 59 | Admitting: Radiation Oncology

## 2023-04-12 ENCOUNTER — Other Ambulatory Visit (HOSPITAL_COMMUNITY): Payer: Self-pay

## 2023-04-12 ENCOUNTER — Other Ambulatory Visit: Payer: Self-pay | Admitting: *Deleted

## 2023-04-12 ENCOUNTER — Other Ambulatory Visit: Payer: Self-pay | Admitting: Radiation Therapy

## 2023-04-12 DIAGNOSIS — C7931 Secondary malignant neoplasm of brain: Secondary | ICD-10-CM

## 2023-04-12 MED ORDER — CEPHALEXIN 500 MG PO CAPS
500.0000 mg | ORAL_CAPSULE | Freq: Two times a day (BID) | ORAL | 0 refills | Status: AC
  Filled 2023-04-12: qty 10, 5d supply, fill #0

## 2023-04-14 ENCOUNTER — Other Ambulatory Visit: Payer: Self-pay

## 2023-04-17 ENCOUNTER — Ambulatory Visit
Admission: RE | Admit: 2023-04-17 | Discharge: 2023-04-17 | Disposition: A | Discharge status: Home / Self Care | Payer: 59 | Source: Ambulatory Visit | Attending: Hematology | Admitting: Hematology

## 2023-04-17 DIAGNOSIS — C20 Malignant neoplasm of rectum: Secondary | ICD-10-CM | POA: Diagnosis not present

## 2023-04-17 LAB — GLUCOSE, CAPILLARY: Glucose-Capillary: 65 mg/dL — ABNORMAL LOW (ref 70–99)

## 2023-04-17 MED ORDER — FLUDEOXYGLUCOSE F - 18 (FDG) INJECTION
6.7000 | Freq: Once | INTRAVENOUS | Status: AC | PRN
  Administered 2023-04-17: 7.33 via INTRAVENOUS

## 2023-04-20 ENCOUNTER — Other Ambulatory Visit (HOSPITAL_COMMUNITY): Payer: 59

## 2023-04-25 NOTE — Progress Notes (Signed)
[facility] [address], [place] [address]    Clinic Day:  04/26/2023  Referring physician: Babs Sciara, MD  Patient Care Team: Babs Sciara, MD as PCP - General (Family Medicine) [name], MD as Medical Oncologist (Medical Oncology)   ASSESSMENT & PLAN:   Assessment: 1.  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  Liver biopsy on 03/09/2018 consistent with adenocarcinoma. 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 -7 cycles of FOLFOX with vectibix completed on 06/27/2018. -XRT with Xeloda from 07/30/2018 through 09/06/2018. -Right liver lesion microwave ablation on 10/15/2018 at [facility]. -Low anterior resection and diverting ileostomy on 12/07/2018, pathology [id number], 0/6 lymph nodes positive, margins negative. -Loop ileostomy reversal on 04/17/2019. - Microwave ablation of the right hepatic lobe lesion by Dr. Selena Batten around 08/15/2019. -SBRT to the left lower lobe lung lesion and left liver lobe lesion on 12/13/2019 at [facility]. - Right upper lobe lesion SBRT from 08/06/2020 through 08/13/2020. - 3 left lung lesions and subcarinal lymph node IMRT from 09/21/2021 through 10/05/2021. - Liver biopsy (06/09/2022): Metastatic moderately differentiated colonic adenocarcinoma. - NGS: KRAS/NRAS/BRAF wild-type, MSI-stable, HER2-0, negative for other targetable mutations - Cycle 1 of FOLFOX and bevacizumab on 07/06/2022, oxaliplatin discontinued from 11/09/2022 (cycle 8), 5-FU and bevacizumab maintenance changed to every 3 weeks on 01/04/2023 - XRT to the right neck from 07/14/2022 through 08/03/2022    Plan: 1.  Stage IV rectal adenocarcinoma, BRAF/RAS wild-type, MSI-stable: -She is tolerating maintenance 5-FU and bevacizumab every 3 weeks very well. - She had a couple of UTIs in the last 2 weeks and was treated with Macrobid and Keflex.  She reports decreased energy levels when  the pump is DC'd each time. - Reviewed labs from today: Total bilirubin 2.2, rest of LFTs are normal.  White count 1.7 with ANC 1.1.  Platelet count is 58.  Last CEA is 3.8 on 03/13/2023. - Reviewed PET scan images from 04/17/2023: Stable residual hypermetabolic nodule in the left upper and left lower lobes.  Quiescent right adrenal metastasis.  Other benign findings were discussed. - She would like to stretch out the maintenance chemotherapy to once every 4 weeks.  We also discussed option of Xeloda and bevacizumab.  We will continue with maintenance 5-FU and bevacizumab.  We will change it every 4 weeks. - Will check CEA level with each cycle.  Will plan to repeat PET scan in 4 months and sooner if she is symptomatic. - Proceed with maintenance chemo today and every 4 weeks.  RTC 8 weeks for follow-up.   2.  Leukopenia and thrombocytopenia: - Intermittent leukopenia and thrombocytopenia worsened by splenomegaly.  Will continue to closely monitor.   3.  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  Unchanged subcentimeter enhancing lesion within the left frontal calvarium.    Orders Placed This Encounter  Procedures   CEA    Standing Status:   Future    Expected Date:   05/23/2023    Expiration Date:   05/22/2024   Magnesium    Standing Status:   Future    Expected Date:   05/23/2023    Expiration Date:   05/22/2024   CBC with Differential    Standing Status:   Future    Expected Date:   05/23/2023    Expiration Date:   05/22/2024   Comprehensive metabolic panel  Standing Status:   Future    Expected Date:   05/23/2023    Expiration Date:   05/22/2024   Urinalysis, dipstick only    Standing Status:   Future    Expected Date:   05/23/2023    Expiration Date:   05/22/2024   CEA    Standing Status:   Future    Expected Date:   06/20/2023    Expiration Date:   06/19/2024   Magnesium    Standing Status:    Future    Expected Date:   06/20/2023    Expiration Date:   06/19/2024   CBC with Differential    Standing Status:   Future    Expected Date:   06/20/2023    Expiration Date:   06/19/2024   Comprehensive metabolic panel    Standing Status:   Future    Expected Date:   06/20/2023    Expiration Date:   06/19/2024   Urinalysis, dipstick only    Standing Status:   Future    Expected Date:   06/20/2023    Expiration Date:   06/19/2024   CEA    Standing Status:   Future    Expected Date:   07/18/2023    Expiration Date:   07/17/2024   Magnesium    Standing Status:   Future    Expected Date:   07/18/2023    Expiration Date:   07/17/2024   CBC with Differential    Standing Status:   Future    Expected Date:   07/18/2023    Expiration Date:   07/17/2024   Comprehensive metabolic panel    Standing Status:   Future    Expected Date:   07/18/2023    Expiration Date:   07/17/2024   Urinalysis, dipstick only    Standing Status:   Future    Expected Date:   07/18/2023    Expiration Date:   07/17/2024      I,Katie Daubenspeck,acting as a scribe for [name], MD.,have documented all relevant documentation on the behalf of [name], MD,as directed by  [name], MD while in the presence of [name], MD.   I, [name] MD, have reviewed the above documentation for accuracy and completeness, and I agree with the above.   [name], MD   1/29/20259:29 AM  CHIEF COMPLAINT:   Diagnosis: metastatic rectal cancer    Cancer Staging  Malignant neoplasm of rectum [facility]) Staging form: Colon and Rectum, AJCC 8th Edition - Clinical stage from 03/13/2018: Stage IVA (cT3, cN1b, cM1a) - Signed by [name], MD on 03/13/2018    Prior Therapy: 1. FOLFOX & vectibix x 7 cycles from 03/14/2018 to 06/27/2018. 2. XRT with Xeloda from 07/30/2018 to 09/06/2018. 3. Right liver lesion microwave ablation on 10/15/2018. 4. Low anterior resection  and diverting ileostomy on 12/07/2018. 5. Right hepatic lobe microwave ablation on 08/15/19 6. SBRT to LLL and liver 11/15/2019 -12/13/2019. 7. SBRT to RUL 08/06/20 - 08/13/20 8. IMRT to 3 left lung lesions and subcarinal LN 09/21/21 - 10/05/21 9. XRT to C6 07/14/22 - 08/03/22 10. SRS to brain metastasis on 08/31/22 11. FOLFOX and bevacizumab, 8 cycles, 07/06/22 - 11/09/22  Current Therapy:  maintenance 5-FU and bevacizumab   HISTORY OF PRESENT ILLNESS:   Oncology History  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HCC)  02/26/2018 Initial Diagnosis   Rectal cancer (HCC)   03/13/2018 Cancer Staging   Staging form: Colon and Rectum, AJCC 8th Edition - Clinical stage  from 03/13/2018: Stage IVA (cT3, cN1b, cM1a) - Signed by [name], MD on 03/13/2018   03/14/2018 - 06/29/2018 Chemotherapy   The patient had palonosetron (ALOXI) injection 0.25 mg, 0.25 mg, Intravenous,  Once, 7 of 8 cycles Administration: 0.25 mg (03/14/2018), 0.25 mg (03/27/2018), 0.25 mg (04/18/2018), 0.25 mg (05/02/2018), 0.25 mg (05/16/2018), 0.25 mg (06/05/2018), 0.25 mg (06/27/2018) leucovorin 800 mg in dextrose 5 % 250 mL infusion, 772 mg, Intravenous,  Once, 7 of 8 cycles Administration: 800 mg (03/14/2018), 800 mg (03/27/2018), 800 mg (05/02/2018), 800 mg (05/16/2018), 700 mg (04/18/2018), 800 mg (06/05/2018), 800 mg (06/27/2018) oxaliplatin (ELOXATIN) 165 mg in dextrose 5 % 500 mL chemo infusion, 85 mg/m2 = 165 mg, Intravenous,  Once, 6 of 7 cycles Dose modification: 68 mg/m2 (80 % of original dose 85 mg/m2, Cycle 4, Reason: Provider Judgment) Administration: 165 mg (03/14/2018), 165 mg (03/27/2018), 130 mg (05/02/2018), 130 mg (05/16/2018), 130 mg (06/05/2018), 130 mg (06/27/2018) panitumumab (VECTIBIX) 500 mg in sodium chloride 0.9 % 100 mL chemo infusion, 480 mg, Intravenous,  Once, 4 of 5 cycles Administration: 500 mg (04/18/2018), 480 mg (05/02/2018), 480 mg (05/16/2018), 480 mg (06/05/2018) fluorouracil (ADRUCIL) chemo injection 750 mg, 400 mg/m2  = 750 mg (100 % of original dose 400 mg/m2), Intravenous,  Once, 6 of 7 cycles Dose modification: 400 mg/m2 (original dose 400 mg/m2, Cycle 1), 400 mg/m2 (original dose 400 mg/m2, Cycle 3) Administration: 750 mg (03/14/2018), 750 mg (04/18/2018), 750 mg (05/02/2018), 750 mg (05/16/2018), 750 mg (06/05/2018), 750 mg (06/27/2018) fosaprepitant (EMEND) 150 mg, dexamethasone (DECADRON) 12 mg in sodium chloride 0.9 % 145 mL IVPB, , Intravenous,  Once, 4 of 5 cycles Administration:  (05/02/2018),  (05/16/2018),  (06/05/2018),  (06/27/2018) fluorouracil (ADRUCIL) 4,650 mg in sodium chloride 0.9 % 57 mL chemo infusion, 2,400 mg/m2 = 4,650 mg, Intravenous, 1 Day/Dose, 7 of 8 cycles Administration: 4,650 mg (03/14/2018), 4,650 mg (03/27/2018), 4,650 mg (04/18/2018), 4,650 mg (05/02/2018), 4,650 mg (05/16/2018), 4,650 mg (06/05/2018), 4,650 mg (06/27/2018)  for chemotherapy treatment.    07/06/2022 -  Chemotherapy   Patient is on Treatment Plan : COLORECTAL FOLFOX + Bevacizumab q14d        INTERVAL HISTORY:   Samantha Reynolds is a 58 y.o. female presenting to clinic today for follow up of metastatic rectal cancer. She was last seen by me on 02/09/23.  Since her last visit, she underwent surveillance brain MRI on 03/24/23 showing: no evidence of intracranial metastatic disease; unchanged subcentimeter enhancing lesion within left frontal calvarium.  She also underwent restaging PET scan on 04/17/23 showing: residual hypermetabolic nodules in LUL and LLL; previously seen RML nodule decreased in size and no longer hypermetabolic; quiescent right adrenal metastasis.  Today, she states that she is doing well overall. Her appetite level is at 75%. Her energy level is at 60%.  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    Worse in her 32s when she was ill   H/O Hashimoto thyroiditis    History of chemotherapy    History of radiation therapy    History of thyroid cancer no recurrence   2013--  s/p  left lobe thyroidectomy--  follicular varient papillary / lymphocyctic thyroiditis   Hyperlipidemia 11/09/2015   Hypothyroidism    Malignant neoplasm of  rectum (HCC) 02/26/2018    Surgical History: Past Surgical History:  Procedure Laterality Date   BIOPSY  02/19/2018   Procedure: BIOPSY;  Surgeon: Malissa Hippo, MD;  [facility]: [facility];  Service: Endoscopy;;  rectum   COLONOSCOPY N/A 08/20/2015   Procedure: COLONOSCOPY;  Surgeon: Malissa Hippo, MD;  [facility]: [facility];  Service: Endoscopy;  Laterality: N/A;  730   COLONOSCOPY WITH PROPOFOL N/A 07/21/2021   Procedure: COLONOSCOPY WITH PROPOFOL;  Surgeon: Malissa Hippo, MD;  Location: [facility];  Service: Endoscopy;  Laterality: N/A;  10:10   D & C HYSTEROOSCOPY W/ THERMACHOICE ENDOMETRIAL ABLATION  08-13-2004   DIVERTING ILEOSTOMY N/A 12/07/2018   Procedure: DIVERTING LOOP ILEOSTOMY;  Surgeon: Romie Levee, MD;  Location: [facility];  Service: General;  Laterality: N/A;   FLEXIBLE SIGMOIDOSCOPY N/A 02/19/2018   Procedure: FLEXIBLE SIGMOIDOSCOPY;  Surgeon: Malissa Hippo, MD;  Location: [facility];  Service: Endoscopy;  Laterality: N/A;   HYSTEROSCOPY WITH D & C N/A 04/30/2015   Procedure: DILATATION AND CURETTAGE / INTENDED HYSTEROSCOPY;  Surgeon: Marcelle Overlie, MD;  Location: [facility];  Service: Gynecology;  Laterality: N/A;   ILEOSTOMY CLOSURE N/A 04/17/2019   Procedure: LOOP ILEOSTOMY REVERSAL;  Surgeon: Romie Levee, MD;  Location: [facility];  Service: General;  Laterality: N/A;   IR US GUIDE BX ASP/DRAIN  03/09/2018   LAPAROSCOPIC CHOLECYSTECTOMY  04/1996   LAPAROSCOPY N/A 12/11/2018   Procedure: LAPAROSCOPY  ILEOSTOMY REVISION AND ABDOMINAL WASHOUT;  Surgeon: Romie Levee, MD;  Location: [facility];  Service: General;   Laterality: N/A;   Liver Microwave   10/17/2018   POLYPECTOMY  08/20/2015   Procedure: POLYPECTOMY;  Surgeon: Malissa Hippo, MD;  [facility]: [facility];  Service: Endoscopy;;  Splenic flexure polypectomy   POLYPECTOMY  02/19/2018   Procedure: POLYPECTOMY;  Surgeon: Malissa Hippo, MD;  [facility]: [facility];  Service: Endoscopy;;  rectum   PORTACATH PLACEMENT N/A 03/14/2018   Procedure: INSERTION PORT-A-CATH;  Surgeon: Franky Macho, MD;  Location: [facility];  Service: General;  Laterality: N/A;   REDUCTION INCARCERATED UTERUS  06-20-2000   intrauterine preg. 13 wks /  urinary retention   THYROID LOBECTOMY  11/24/2011   Procedure: THYROID LOBECTOMY;  Surgeon: Velora Heckler, MD;  Location: [facility];  Service: General;  Laterality: Left;  Left Thyroid Lobectomy   TUBAL LIGATION  2002   XI ROBOTIC ASSISTED LOWER ANTERIOR RESECTION N/A 12/07/2018   Procedure: XI ROBOTIC ASSISTED LOWER ANTERIOR RESECTION, SPENIC FLEXURE IMMOBILIZATION, COLOANAL ANASTOMOSIS, RIGID PROCTOSCOPY;  Surgeon: Romie Levee, MD;  Location: [facility];  Service: General;  Laterality: N/A;    Social History: Social History   Socioeconomic History   Marital status: Married    Spouse name: Not on file   Number of children: 4   Years of education: Not on file   Highest education level: Not on file  Occupational History    Comment: Systems developer at [employer]  Tobacco Use   Smoking status: Former    Current packs/day: 0.00    Average packs/day: 0.3 packs/day for 10.0 years (2.5 ttl pk-yrs)    Types: Cigarettes    Start date: 10/30/1981    Quit date: 10/31/1991    Years since quitting: 31.5   Smokeless tobacco: Never  Vaping Use   Vaping status: Never Used  Substance and Sexual Activity   Alcohol use: No   Drug use: No   Sexual activity: Not Currently  Other  Topics Concern   Not on file  Social History Narrative   Lives with husband Loraine Leriche and 24 year old son Almeta Monas   Husband is Technical brewer of [employer]   Social  Drivers of Health   Financial Resource Strain: Low Risk  (02/26/2018)   Overall Financial Resource Strain (CARDIA)    Difficulty of Paying Living Expenses: Not hard at all  Food Insecurity: No Food Insecurity (12/05/2022)   Hunger Vital Sign    Worried About Running Out of Food in the Last Year: Never true    Ran Out of Food in the Last Year: Never true  Transportation Needs: No Transportation Needs (12/05/2022)   PRAPARE - Administrator, Civil Service (Medical): No    Lack of Transportation (Non-Medical): No  Physical Activity: Inactive (02/26/2018)   Exercise Vital Sign    Days of Exercise per Week: 0 days    Minutes of Exercise per Session: 0 min  Stress: Stress Concern Present (02/26/2018)   [employer] of Occupational Health - Occupational Stress Questionnaire    Feeling of Stress : To some extent  Social Connections: Socially Integrated (02/26/2018)   Social Connection and Isolation Panel [NHANES]    Frequency of Communication with Friends and Family: More than three times a week    Frequency of Social Gatherings with Friends and Family: Twice a week    Attends Religious Services: More than 4 times per year    Active Member of [employer] or Organizations: Yes    Attends Engineer, structural: More than 4 times per year    Marital Status: Married  Catering manager Violence: Not At Risk (12/05/2022)   Humiliation, Afraid, Rape, and Kick questionnaire    Fear of Current or Ex-Partner: No    Emotionally Abused: No    Physically Abused: No    Sexually Abused: No    Family History: Family History  Problem Relation Age of Onset   Hypertension Mother    Hypertension Father    Prostate cancer Father    Cancer Maternal Aunt        spine/back   Cancer Paternal Grandfather        lung   Healthy Son    Healthy Son    Healthy Son    Healthy Son    Colon cancer Neg Hx     Current Medications:  Current Outpatient Medications:    ALPRAZolam (XANAX) 0.5 MG  tablet, Take 1 tablet (0.5 mg total) by mouth 3 (three) times daily as needed for anxiety., Disp: 90 tablet, Rfl: 3   amitriptyline (ELAVIL) 10 MG tablet, Take 1 tablet (10 mg total) by mouth 2 (two) times daily., Disp: 180 tablet, Rfl: 3   ARMOUR THYROID 60 MG tablet, Take 1 tablet (60 mg total) by mouth daily., Disp: 90 tablet, Rfl: 1   loperamide (IMODIUM) 2 MG capsule, Take 1 capsule (2 mg total) by mouth 3 (three) times daily. (Patient taking differently: Take 2 mg by mouth daily.), Disp: 30 capsule, Rfl: 0   prochlorperazine (COMPAZINE) 10 MG tablet, Take 1 tablet (10 mg total) by mouth every 6 (six) hours as needed for nausea or vomiting., Disp: 30 tablet, Rfl: 4 No current facility-administered medications for this visit.  Facility-Administered Medications Ordered in Other Visits:    bevacizumab-awwb (MVASI) 300 mg in sodium chloride 0.9 % 100 mL chemo infusion, 5 mg/kg (Treatment Plan Recorded), Intravenous, Once, [name], MD   clindamycin (CLEOCIN) 900 mg in dextrose 5 %  50 mL IVPB, 900 mg, Intravenous, 60 min Pre-Op **AND** gentamicin (GARAMYCIN) 350 mg in dextrose 5 % 50 mL IVPB, 5 mg/kg, Intravenous, 60 min Pre-Op, Romie Levee, MD   clindamycin (CLEOCIN) 900 mg in dextrose 5 % 50 mL IVPB, 900 mg, Intravenous, 60 min Pre-Op **AND** gentamicin (GARAMYCIN) 5 mg/kg in dextrose 5 % 50 mL IVPB, 5 mg/kg, Intravenous, 60 min Pre-Op, Romie Levee, MD   dexamethasone (DECADRON) injection 10 mg, 10 mg, Intravenous, Once, [name], MD   famotidine (PEPCID) IVPB 20 mg premix, 20 mg, Intravenous, Once, [name], MD   fluorouracil (ADRUCIL) 3,500 mg in sodium chloride 0.9 % 80 mL chemo infusion, 1,920 mg/m2 (Treatment Plan Recorded), Intravenous, 1 day or 1 dose, [name], MD   fluorouracil (ADRUCIL) chemo injection 500 mg, 320 mg/m2 (Treatment Plan Recorded), Intravenous, Once, [name], MD   leucovorin 540 mg in sodium chloride 0.9 %  250 mL infusion, 320 mg/m2 (Treatment Plan Recorded), Intravenous, Once, [name], MD   palonosetron (ALOXI) injection 0.25 mg, 0.25 mg, Intravenous, Once, [name], MD   sodium chloride 0.9 % 1,000 mL with potassium chloride 20 mEq, magnesium sulfate 2 g infusion, , Intravenous, Once, [name], MD   Allergies: Allergies  Allergen Reactions   Oxaliplatin Other (See Comments)    Patient had hypersensitivity reaction to Oxaliplatin. Patient c/o itching in her ears and eyes. Pt also c/o being hot, flushed, dizzy with redness on pt's face, arms, and legs. Pt c/o tingling in her hands and legs. Pt also had a dry throat with coughing. See progress note from 09/07/22 at 1130. Patient was not able to complete Oxaliplatin infusion.   Second reaction 10/26/2022. Itching in ears, heart racing, & face flushing. See progress note from 10/26/2022. Able to complete infusion after medications.   Demerol [Meperidine] Itching    All over the body.   Penicillins Hives     childhood, does not remember if it spread all over the body or not. Did it involve swelling of the face/tongue/throat, SOB, or low BP? No Did it involve sudden or severe rash/hives, skin peeling, or any reaction on the inside of your mouth or nose? Yes Did you need to seek medical attention at a hospital or doctor's office? Yes When did it last happen?      childhood allergy If all above answers are "NO", may proceed with cephalosporin use.    Levaquin [Levofloxacin] Other (See Comments)    Patient has Aortic Aneurysm and is not indicated with this diagnosis   Other Hives and Other (See Comments)    Fresh coconut    Vancomycin Rash    Will need Benadryl prior to administration per IV. "Red man syndrome"    REVIEW OF SYSTEMS:   Review of Systems  Constitutional:  Negative for chills, fatigue and fever.  HENT:   Negative for lump/mass, mouth sores, nosebleeds, sore throat and trouble swallowing.   Eyes:   Negative for eye problems.  Respiratory:  Negative for cough and shortness of breath.   Cardiovascular:  Negative for chest pain, leg swelling and palpitations.  Gastrointestinal:  Negative for abdominal pain, constipation, diarrhea, nausea and vomiting.  Genitourinary:  Negative for bladder incontinence, difficulty urinating, dysuria, frequency, hematuria and nocturia.   Musculoskeletal:  Negative for arthralgias, back pain, flank pain, myalgias and neck pain.  Skin:  Negative for itching and rash.  Neurological:  Negative for dizziness, headaches and numbness.  Hematological:  Does not bruise/bleed easily.  Psychiatric/Behavioral:  Negative  for depression, sleep disturbance and suicidal ideas. The patient is not nervous/anxious.   All other systems reviewed and are negative.    VITALS:   Blood pressure 123/80, pulse (!) 106, temperature 98.2 F (36.8 C), temperature source Oral, resp. rate 16, weight 132 lb 15 oz (60.3 kg), last menstrual period 02/16/2015, SpO2 100%.  Wt Readings from Last 3 Encounters:  04/26/23 132 lb 15 oz (60.3 kg)  04/05/23 130 lb 6.4 oz (59.1 kg)  03/13/23 132 lb 12.8 oz (60.2 kg)    Body mass index is 21.14 kg/m.  Performance status (ECOG): 1 - Symptomatic but completely ambulatory  PHYSICAL EXAM:   Physical Exam Vitals and nursing note reviewed. Exam conducted with a chaperone present.  Constitutional:      Appearance: Normal appearance.  Cardiovascular:     Rate and Rhythm: Normal rate and regular rhythm.     Pulses: Normal pulses.     Heart sounds: Normal heart sounds.  Pulmonary:     Effort: Pulmonary effort is normal.     Breath sounds: Normal breath sounds.  Abdominal:     Palpations: Abdomen is soft. There is no hepatomegaly, splenomegaly or mass.     Tenderness: There is no abdominal tenderness.  Musculoskeletal:     Right lower leg: No edema.     Left lower leg: No edema.  Lymphadenopathy:     Cervical: No cervical adenopathy.      Right cervical: No superficial, deep or posterior cervical adenopathy.    Left cervical: No superficial, deep or posterior cervical adenopathy.     Upper Body:     Right upper body: No supraclavicular or axillary adenopathy.     Left upper body: No supraclavicular or axillary adenopathy.  Neurological:     General: No focal deficit present.     Mental Status: She is alert and oriented to person, place, and time.  Psychiatric:        Mood and Affect: Mood normal.        Behavior: Behavior normal.     LABS:   CBC     Component Value Date/Time   WBC 1.7 (L) 04/26/2023 0810   RBC 3.26 (L) 04/26/2023 0810   HGB 11.3 (L) 04/26/2023 0810   HGB 13.5 11/03/2015 0841   HCT 33.9 (L) 04/26/2023 0810   HCT 39.7 11/03/2015 0841   PLT 58 (L) 04/26/2023 0810   PLT 165 11/03/2015 0841   MCV 104.0 (H) 04/26/2023 0810   MCV 95 11/03/2015 0841   MCH 34.7 (H) 04/26/2023 0810   MCHC 33.3 04/26/2023 0810   RDW 14.6 04/26/2023 0810   RDW 13.2 11/03/2015 0841   LYMPHSABS 0.4 (L) 04/26/2023 0810   LYMPHSABS 1.4 11/03/2015 0841   MONOABS 0.2 04/26/2023 0810   EOSABS 0.0 04/26/2023 0810   EOSABS 0.1 11/03/2015 0841   BASOSABS 0.0 04/26/2023 0810   BASOSABS 0.0 11/03/2015 0841    CMP      Component Value Date/Time   NA 136 04/26/2023 0810   NA 140 11/03/2015 0841   K 3.6 04/26/2023 0810   CL 103 04/26/2023 0810   CO2 24 04/26/2023 0810   GLUCOSE 89 04/26/2023 0810   BUN 11 04/26/2023 0810   BUN 10 11/03/2015 0841   CREATININE 0.80 04/26/2023 0810   CALCIUM 8.7 (L) 04/26/2023 0810   PROT 6.8 04/26/2023 0810   PROT 6.9 11/03/2015 0841   ALBUMIN 3.6 04/26/2023 0810   ALBUMIN 4.5 11/03/2015 0841   AST 30 04/26/2023  0810   ALT 16 04/26/2023 0810   ALKPHOS 111 04/26/2023 0810   BILITOT 2.2 (H) 04/26/2023 0810   BILITOT 2.1 (H) 11/03/2015 0841   GFRNONAA >60 04/26/2023 0810   GFRAA >60 11/21/2019 1348     Lab Results  Component Value Date   CEA1 3.8 03/13/2023   /  CEA  Date  Value Ref Range Status  03/13/2023 3.8 0.0 - 4.7 ng/mL Final    Comment:    (NOTE)                             Nonsmokers          <3.9                             Smokers             <5.6 [employer] Electrochemiluminescence Immunoassay (ECLIA) Values obtained with different assay methods or kits cannot be used interchangeably.  Results cannot be interpreted as absolute evidence of the presence or absence of malignant disease. Performed At: [facility] [address] [place], [place] [id number] Jolene Schimke MD [id number]    No results found for: "PSA1" No results found for: "CAN199" No results found for: "CAN125"  No results found for: "TOTALPROTELP", "ALBUMINELP", "A1GS", "A2GS", "BETS", "BETA2SER", "GAMS", "MSPIKE", "SPEI" Lab Results  Component Value Date   TIBC 333 09/07/2022   TIBC 364 05/11/2022   TIBC 385 11/09/2020   FERRITIN 89 09/07/2022   FERRITIN 61 05/11/2022   FERRITIN 54 11/09/2020   IRONPCTSAT 36 (H) 09/07/2022   IRONPCTSAT 26 05/11/2022   IRONPCTSAT 22 11/09/2020   Lab Results  Component Value Date   LDH 154 01/20/2020     STUDIES:   NM PET Image Restag (PS) Skull Base To Thigh Result Date: 04/24/2023 CLINICAL DATA:  Subsequent treatment strategy for rectal cancer. EXAM: NUCLEAR MEDICINE PET SKULL BASE TO THIGH TECHNIQUE: 7.3 mCi F-18 FDG was injected intravenously. Full-ring PET imaging was performed from the skull base to thigh after the radiotracer. CT data was obtained and used for attenuation correction and anatomic localization. Fasting blood glucose: 65 mg/dl COMPARISON:  40/12/2723. FINDINGS: Mediastinal blood pool activity: SUV max 3.4 Liver activity: SUV max NA NECK: No abnormal hypermetabolism. Incidental CT findings: None. CHEST: Asymmetric right thyroid activity, as before. Hypermetabolic posterior left upper lobe nodule measures 6 mm (6/48), SUV max 2.6, stable. Nodular consolidation in the posteromedial left lower lobe  measures approximately 11 mm (6/67), SUV max 4.1, previously 2.3. Previously seen hypermetabolic nodule in the medial segment right middle lobe has decreased in size and is no longer hypermetabolic. Mild hypermetabolism associated with post treatment scarring in the apical segment right upper lobe. Incidental CT findings: Left IJ Port-A-Cath terminates in the SVC. Ascending aorta measures 4.8 cm approximately. Heart size normal. No pericardial effusion. No pericardial effusion. ABDOMEN/PELVIS: No abnormal hypermetabolism. Incidental CT findings: Vague 12 mm low-attenuation lesion in the inferior right hepatic lobe, similar. Cholecystectomy. 2.3 cm right adrenal nodule measures 54 Hounsfield units, stable. No associated abnormal hypermetabolism. Possible hemangioma in the spleen. Spleen appears enlarged. Atherosclerotic calcification of the aorta. SKELETON: No abnormal hypermetabolism. Incidental CT findings: Degenerative changes in the spine. IMPRESSION: 1. Residual hypermetabolic nodules in the left upper and left lower lobes. Previously seen right middle lobe nodule has decreased in size and is no longer hypermetabolic. 2. Quiescent right adrenal metastasis. 3. 4.8  cm ascending aortic aneurysm. Ascending thoracic aortic aneurysm. Recommend semi-annual imaging followup by CTA or MRA and referral to cardiothoracic surgery if not already obtained. This recommendation follows 2010 ACCF/AHA/AATS/ACR/ASA/SCA/SCAI/SIR/STS/SVM Guidelines for the Diagnosis and Management of Patients With Thoracic Aortic Disease. Circulation. 2010; 121: Z610-R604. Aortic aneurysm NOS (ICD10-I71.9). 4. Asymmetric right thyroid metabolism, as before. Consider laboratory correlation. 5.  Aortic atherosclerosis (ICD10-I70.0). Electronically Signed   By: Leanna Battles M.D.   On: 04/24/2023 14:32

## 2023-04-26 ENCOUNTER — Inpatient Hospital Stay: Payer: 59

## 2023-04-26 ENCOUNTER — Inpatient Hospital Stay (HOSPITAL_BASED_OUTPATIENT_CLINIC_OR_DEPARTMENT_OTHER): Payer: 59 | Admitting: Hematology

## 2023-04-26 VITALS — BP 123/80 | HR 106 | Temp 98.2°F | Resp 16 | Wt 132.9 lb

## 2023-04-26 VITALS — BP 122/74 | HR 88 | Resp 18

## 2023-04-26 DIAGNOSIS — D72819 Decreased white blood cell count, unspecified: Secondary | ICD-10-CM | POA: Diagnosis not present

## 2023-04-26 DIAGNOSIS — Z5112 Encounter for antineoplastic immunotherapy: Secondary | ICD-10-CM | POA: Diagnosis not present

## 2023-04-26 DIAGNOSIS — R5383 Other fatigue: Secondary | ICD-10-CM | POA: Diagnosis not present

## 2023-04-26 DIAGNOSIS — C20 Malignant neoplasm of rectum: Secondary | ICD-10-CM | POA: Diagnosis not present

## 2023-04-26 DIAGNOSIS — I700 Atherosclerosis of aorta: Secondary | ICD-10-CM | POA: Diagnosis not present

## 2023-04-26 DIAGNOSIS — C7971 Secondary malignant neoplasm of right adrenal gland: Secondary | ICD-10-CM | POA: Diagnosis not present

## 2023-04-26 DIAGNOSIS — D696 Thrombocytopenia, unspecified: Secondary | ICD-10-CM | POA: Diagnosis not present

## 2023-04-26 DIAGNOSIS — C787 Secondary malignant neoplasm of liver and intrahepatic bile duct: Secondary | ICD-10-CM | POA: Diagnosis not present

## 2023-04-26 DIAGNOSIS — C7931 Secondary malignant neoplasm of brain: Secondary | ICD-10-CM | POA: Diagnosis not present

## 2023-04-26 DIAGNOSIS — Z5111 Encounter for antineoplastic chemotherapy: Secondary | ICD-10-CM | POA: Diagnosis not present

## 2023-04-26 LAB — URINALYSIS, DIPSTICK ONLY
Bilirubin Urine: NEGATIVE
Glucose, UA: NEGATIVE mg/dL
Hgb urine dipstick: NEGATIVE
Ketones, ur: NEGATIVE mg/dL
Nitrite: NEGATIVE
Protein, ur: NEGATIVE mg/dL
Specific Gravity, Urine: 1.012 (ref 1.005–1.030)
pH: 6 (ref 5.0–8.0)

## 2023-04-26 LAB — CBC WITH DIFFERENTIAL/PLATELET
Abs Immature Granulocytes: 0 10*3/uL (ref 0.00–0.07)
Basophils Absolute: 0 10*3/uL (ref 0.0–0.1)
Basophils Relative: 1 %
Eosinophils Absolute: 0 10*3/uL (ref 0.0–0.5)
Eosinophils Relative: 1 %
HCT: 33.9 % — ABNORMAL LOW (ref 36.0–46.0)
Hemoglobin: 11.3 g/dL — ABNORMAL LOW (ref 12.0–15.0)
Immature Granulocytes: 0 %
Lymphocytes Relative: 25 %
Lymphs Abs: 0.4 10*3/uL — ABNORMAL LOW (ref 0.7–4.0)
MCH: 34.7 pg — ABNORMAL HIGH (ref 26.0–34.0)
MCHC: 33.3 g/dL (ref 30.0–36.0)
MCV: 104 fL — ABNORMAL HIGH (ref 80.0–100.0)
Monocytes Absolute: 0.2 10*3/uL (ref 0.1–1.0)
Monocytes Relative: 10 %
Neutro Abs: 1.1 10*3/uL — ABNORMAL LOW (ref 1.7–7.7)
Neutrophils Relative %: 63 %
Platelets: 58 10*3/uL — ABNORMAL LOW (ref 150–400)
RBC: 3.26 MIL/uL — ABNORMAL LOW (ref 3.87–5.11)
RDW: 14.6 % (ref 11.5–15.5)
Smear Review: DECREASED
WBC: 1.7 10*3/uL — ABNORMAL LOW (ref 4.0–10.5)
nRBC: 0 % (ref 0.0–0.2)

## 2023-04-26 LAB — COMPREHENSIVE METABOLIC PANEL
ALT: 16 U/L (ref 0–44)
AST: 30 U/L (ref 15–41)
Albumin: 3.6 g/dL (ref 3.5–5.0)
Alkaline Phosphatase: 111 U/L (ref 38–126)
Anion gap: 9 (ref 5–15)
BUN: 11 mg/dL (ref 6–20)
CO2: 24 mmol/L (ref 22–32)
Calcium: 8.7 mg/dL — ABNORMAL LOW (ref 8.9–10.3)
Chloride: 103 mmol/L (ref 98–111)
Creatinine, Ser: 0.8 mg/dL (ref 0.44–1.00)
GFR, Estimated: >60 mL/min (ref >60)
Glucose, Bld: 89 mg/dL (ref 70–99)
Potassium: 3.6 mmol/L (ref 3.5–5.1)
Sodium: 136 mmol/L (ref 135–145)
Total Bilirubin: 2.2 mg/dL — ABNORMAL HIGH (ref 0.0–1.2)
Total Protein: 6.8 g/dL (ref 6.5–8.1)

## 2023-04-26 LAB — MAGNESIUM: Magnesium: 2.1 mg/dL (ref 1.7–2.4)

## 2023-04-26 MED ORDER — PALONOSETRON HCL INJECTION 0.25 MG/5ML
0.2500 mg | Freq: Once | INTRAVENOUS | Status: AC
  Administered 2023-04-26: 0.25 mg via INTRAVENOUS
  Filled 2023-04-26: qty 5

## 2023-04-26 MED ORDER — FAMOTIDINE IN NACL 20-0.9 MG/50ML-% IV SOLN
20.0000 mg | Freq: Once | INTRAVENOUS | Status: AC
  Administered 2023-04-26: 20 mg via INTRAVENOUS
  Filled 2023-04-26: qty 50

## 2023-04-26 MED ORDER — SODIUM CHLORIDE 0.9 % IV SOLN
1920.0000 mg/m2 | INTRAVENOUS | Status: DC
  Administered 2023-04-26: 3500 mg via INTRAVENOUS
  Filled 2023-04-26: qty 70

## 2023-04-26 MED ORDER — SODIUM CHLORIDE 0.9 % IV SOLN: Freq: Once | INTRAVENOUS | Status: AC

## 2023-04-26 MED ORDER — FLUOROURACIL CHEMO INJECTION 500 MG/10ML
320.0000 mg/m2 | Freq: Once | INTRAVENOUS | Status: AC
Start: 2023-04-26 — End: 2023-04-26
  Administered 2023-04-26: 500 mg via INTRAVENOUS
  Filled 2023-04-26: qty 10

## 2023-04-26 MED ORDER — DEXAMETHASONE SODIUM PHOSPHATE 10 MG/ML IJ SOLN
10.0000 mg | Freq: Once | INTRAMUSCULAR | Status: AC
  Administered 2023-04-26: 10 mg via INTRAVENOUS
  Filled 2023-04-26: qty 1

## 2023-04-26 MED ORDER — SODIUM CHLORIDE 0.9 % IV SOLN
320.0000 mg/m2 | Freq: Once | INTRAVENOUS | Status: AC
  Administered 2023-04-26: 540 mg via INTRAVENOUS
  Filled 2023-04-26: qty 27

## 2023-04-26 MED ORDER — SODIUM CHLORIDE 0.9 % IV SOLN
5.0000 mg/kg | Freq: Once | INTRAVENOUS | Status: AC
  Administered 2023-04-26: 300 mg via INTRAVENOUS
  Filled 2023-04-26: qty 12

## 2023-04-26 NOTE — Progress Notes (Signed)
Patient presents today for chemotherapy infusion. Patient is in satisfactory condition with no new complaints voiced.  Vital signs are stable.  Labs reviewed by Dr. Ellin Saba during the office visit.  Platelets today are 58, ANC is 1.1, and bilirubin is 2.2.  MD aware.  All other labs are within treatment parameters. We will proceed with treatment per MD orders.   Patient tolerated treatment well with no complaints voiced.  Home infusion 5FU pump connected.  Patient left ambulatory in stable condition.  Vital signs stable at discharge.  Follow up as scheduled.

## 2023-04-26 NOTE — Patient Instructions (Signed)
[facility] - A DEPT OF [facility]  Discharge Instructions: Thank you for choosing [facility] to provide your oncology and hematology care.  If you have a lab appointment with the Cancer Center - please note that after April 8th, 2024, all labs will be drawn in the cancer center.  You do not have to check in or register with the main entrance as you have in the past but will complete your check-in in the cancer center.  Wear comfortable clothing and clothing appropriate for easy access to any Portacath or PICC line.   We strive to give you quality time with your provider. You may need to reschedule your appointment if you arrive late (15 or more minutes).  Arriving late affects you and other patients whose appointments are after yours.  Also, if you miss three or more appointments without notifying the office, you may be dismissed from the clinic at the provider's discretion.      For prescription refill requests, have your pharmacy contact our office and allow 72 hours for refills to be completed.    Today you received the following chemotherapy and/or immunotherapy agents MVASI/Leucovorin/Fluorouracil.  Bevacizumab Injection What is this medication? BEVACIZUMAB (be va SIZ yoo mab) treats some types of cancer. It works by blocking a protein that causes cancer cells to grow and multiply. This helps to slow or stop the spread of cancer cells. It is a monoclonal antibody. This medicine may be used for other purposes; ask your health care provider or pharmacist if you have questions. COMMON BRAND NAME(S): Alymsys, Avastin, MVASI, [place] Lao What should I tell my care team before I take this medication? They need to know if you have any of these conditions: Blood clots Coughing up blood Having or recent surgery Heart failure High blood pressure History of a connection between 2 or more body parts that do not usually connect  (fistula) History of a tear in your stomach or intestines Protein in your urine An unusual or allergic reaction to bevacizumab, other medications, foods, dyes, or preservatives Pregnant or trying to get pregnant Breast-feeding How should I use this medication? This medication is injected into a vein. It is given by your care team in a hospital or clinic setting. Talk to your care team the use of this medication in children. Special care may be needed. Overdosage: If you think you have taken too much of this medicine contact a poison control center or emergency room at once. NOTE: This medicine is only for you. Do not share this medicine with others. What if I miss a dose? Keep appointments for follow-up doses. It is important not to miss your dose. Call your care team if you are unable to keep an appointment. What may interact with this medication? Interactions are not expected. This list may not describe all possible interactions. Give your health care provider a list of all the medicines, herbs, non-prescription drugs, or dietary supplements you use. Also tell them if you smoke, drink alcohol, or use illegal drugs. Some items may interact with your medicine. What should I watch for while using this medication? Your condition will be monitored carefully while you are receiving this medication. You may need blood work while taking this medication. This medication may make you feel generally unwell. This is not uncommon as chemotherapy can affect healthy cells as well as cancer cells. Report any side effects. Continue your course of treatment even though you feel  ill unless your care team tells you to stop. This medication may increase your risk to bruise or bleed. Call your care team if you notice any unusual bleeding. Before having surgery, talk to your care team to make sure it is ok. This medication can increase the risk of poor healing of your surgical site or wound. You will need to stop  this medication for 28 days before surgery. After surgery, wait at least 28 days before restarting this medication. Make sure the surgical site or wound is healed enough before restarting this medication. Talk to your care team if questions. Talk to your care team if you may be pregnant. Serious birth defects can occur if you take this medication during pregnancy and for 6 months after the last dose. Contraception is recommended while taking this medication and for 6 months after the last dose. Your care team can help you find the option that works for you. Do not breastfeed while taking this medication and for 6 months after the last dose. This medication can cause infertility. Talk to your care team if you are concerned about your fertility. What side effects may I notice from receiving this medication? Side effects that you should report to your care team as soon as possible: Allergic reactions--skin rash, itching, hives, swelling of the face, lips, tongue, or throat Bleeding--bloody or black, tar-like stools, vomiting blood or Sylvia Kondracki material that looks like coffee grounds, red or dark Giovannie Scerbo urine, small red or purple spots on skin, unusual bruising or bleeding Blood clot--pain, swelling, or warmth in the leg, shortness of breath, chest pain Heart attack--pain or tightness in the chest, shoulders, arms, or jaw, nausea, shortness of breath, cold or clammy skin, feeling faint or lightheaded Heart failure--shortness of breath, swelling of the ankles, feet, or hands, sudden weight gain, unusual weakness or fatigue Increase in blood pressure Infection--fever, chills, cough, sore throat, wounds that don't heal, pain or trouble when passing urine, general feeling of discomfort or being unwell Infusion reactions--chest pain, shortness of breath or trouble breathing, feeling faint or lightheaded Kidney injury--decrease in the amount of urine, swelling of the ankles, hands, or feet Stomach pain that is  severe, does not go away, or gets worse Stroke--sudden numbness or weakness of the face, arm, or leg, trouble speaking, confusion, trouble walking, loss of balance or coordination, dizziness, severe headache, change in vision Sudden and severe headache, confusion, change in vision, seizures, which may be signs of posterior reversible encephalopathy syndrome (PRES) Side effects that usually do not require medical attention (report to your care team if they continue or are bothersome): Back pain Change in taste Diarrhea Dry skin Increased tears Nosebleed This list may not describe all possible side effects. Call your doctor for medical advice about side effects. You may report side effects to FDA at 1-800-FDA-1088. Where should I keep my medication? This medication is given in a hospital or clinic. It will not be stored at home. NOTE: This sheet is a summary. It may not cover all possible information. If you have questions about this medicine, talk to your doctor, pharmacist, or health care provider.  2024 Elsevier/Gold Standard (2021-07-30 00:00:00)   Leucovorin Injection What is this medication? LEUCOVORIN (loo koe VOR in) prevents side effects from certain medications, such as methotrexate. It works by increasing folate levels. This helps protect healthy cells in your body. It may also be used to treat anemia caused by low levels of folate. It can also be used with fluorouracil, a type  of chemotherapy, to treat colorectal cancer. It works by increasing the effects of fluorouracil in the body. This medicine may be used for other purposes; ask your health care provider or pharmacist if you have questions. What should I tell my care team before I take this medication? They need to know if you have any of these conditions: Anemia from low levels of vitamin B12 in the blood An unusual or allergic reaction to leucovorin, folic acid, other medications, foods, dyes, or preservatives Pregnant or  trying to get pregnant Breastfeeding How should I use this medication? This medication is injected into a vein or a muscle. It is given by your care team in a hospital or clinic setting. Talk to your care team about the use of this medication in children. Special care may be needed. Overdosage: If you think you have taken too much of this medicine contact a poison control center or emergency room at once. NOTE: This medicine is only for you. Do not share this medicine with others. What if I miss a dose? Keep appointments for follow-up doses. It is important not to miss your dose. Call your care team if you are unable to keep an appointment. What may interact with this medication? Capecitabine Fluorouracil Phenobarbital Phenytoin Primidone Trimethoprim;sulfamethoxazole This list may not describe all possible interactions. Give your health care provider a list of all the medicines, herbs, non-prescription drugs, or dietary supplements you use. Also tell them if you smoke, drink alcohol, or use illegal drugs. Some items may interact with your medicine. What should I watch for while using this medication? Your condition will be monitored carefully while you are receiving this medication. This medication may increase the side effects of 5-fluorouracil. Tell your care team if you have diarrhea or mouth sores that do not get better or that get worse. What side effects may I notice from receiving this medication? Side effects that you should report to your care team as soon as possible: Allergic reactions--skin rash, itching, hives, swelling of the face, lips, tongue, or throat This list may not describe all possible side effects. Call your doctor for medical advice about side effects. You may report side effects to FDA at 1-800-FDA-1088. Where should I keep my medication? This medication is given in a hospital or clinic. It will not be stored at home. NOTE: This sheet is a summary. It may not  cover all possible information. If you have questions about this medicine, talk to your doctor, pharmacist, or health care provider.  2024 Elsevier/Gold Standard (2021-08-17 00:00:00)   Fluorouracil Injection What is this medication? FLUOROURACIL (flure oh YOOR a sil) treats some types of cancer. It works by slowing down the growth of cancer cells. This medicine may be used for other purposes; ask your health care provider or pharmacist if you have questions. COMMON BRAND NAME(S): Adrucil What should I tell my care team before I take this medication? They need to know if you have any of these conditions: Blood disorders Dihydropyrimidine dehydrogenase (DPD) deficiency Infection, such as chickenpox, cold sores, herpes Kidney disease Liver disease Poor nutrition Recent or ongoing radiation therapy An unusual or allergic reaction to fluorouracil, other medications, foods, dyes, or preservatives If you or your partner are pregnant or trying to get pregnant Breast-feeding How should I use this medication? This medication is injected into a vein. It is administered by your care team in a hospital or clinic setting. Talk to your care team about the use of this medication in children.  Special care may be needed. Overdosage: If you think you have taken too much of this medicine contact a poison control center or emergency room at once. NOTE: This medicine is only for you. Do not share this medicine with others. What if I miss a dose? Keep appointments for follow-up doses. It is important not to miss your dose. Call your care team if you are unable to keep an appointment. What may interact with this medication? Do not take this medication with any of the following: Live virus vaccines This medication may also interact with the following: Medications that treat or prevent blood clots, such as warfarin, enoxaparin, dalteparin This list may not describe all possible interactions. Give your  health care provider a list of all the medicines, herbs, non-prescription drugs, or dietary supplements you use. Also tell them if you smoke, drink alcohol, or use illegal drugs. Some items may interact with your medicine. What should I watch for while using this medication? Your condition will be monitored carefully while you are receiving this medication. This medication may make you feel generally unwell. This is not uncommon as chemotherapy can affect healthy cells as well as cancer cells. Report any side effects. Continue your course of treatment even though you feel ill unless your care team tells you to stop. In some cases, you may be given additional medications to help with side effects. Follow all directions for their use. This medication may increase your risk of getting an infection. Call your care team for advice if you get a fever, chills, sore throat, or other symptoms of a cold or flu. Do not treat yourself. Try to avoid being around people who are sick. This medication may increase your risk to bruise or bleed. Call your care team if you notice any unusual bleeding. Be careful brushing or flossing your teeth or using a toothpick because you may get an infection or bleed more easily. If you have any dental work done, tell your dentist you are receiving this medication. Avoid taking medications that contain aspirin, acetaminophen, ibuprofen, naproxen, or ketoprofen unless instructed by your care team. These medications may hide a fever. Do not treat diarrhea with over the counter products. Contact your care team if you have diarrhea that lasts more than 2 days or if it is severe and watery. This medication can make you more sensitive to the sun. Keep out of the sun. If you cannot avoid being in the sun, wear protective clothing and sunscreen. Do not use sun lamps, tanning beds, or tanning booths. Talk to your care team if you or your partner wish to become pregnant or think you might be  pregnant. This medication can cause serious birth defects if taken during pregnancy and for 3 months after the last dose. A reliable form of contraception is recommended while taking this medication and for 3 months after the last dose. Talk to your care team about effective forms of contraception. Do not father a child while taking this medication and for 3 months after the last dose. Use a condom while having sex during this time period. Do not breastfeed while taking this medication. This medication may cause infertility. Talk to your care team if you are concerned about your fertility. What side effects may I notice from receiving this medication? Side effects that you should report to your care team as soon as possible: Allergic reactions--skin rash, itching, hives, swelling of the face, lips, tongue, or throat Heart attack--pain or tightness in the chest,  shoulders, arms, or jaw, nausea, shortness of breath, cold or clammy skin, feeling faint or lightheaded Heart failure--shortness of breath, swelling of the ankles, feet, or hands, sudden weight gain, unusual weakness or fatigue Heart rhythm changes--fast or irregular heartbeat, dizziness, feeling faint or lightheaded, chest pain, trouble breathing High ammonia level--unusual weakness or fatigue, confusion, loss of appetite, nausea, vomiting, seizures Infection--fever, chills, cough, sore throat, wounds that don't heal, pain or trouble when passing urine, general feeling of discomfort or being unwell Low red blood cell level--unusual weakness or fatigue, dizziness, headache, trouble breathing Pain, tingling, or numbness in the hands or feet, muscle weakness, change in vision, confusion or trouble speaking, loss of balance or coordination, trouble walking, seizures Redness, swelling, and blistering of the skin over hands and feet Severe or prolonged diarrhea Unusual bruising or bleeding Side effects that usually do not require medical attention  (report to your care team if they continue or are bothersome): Dry skin Headache Increased tears Nausea Pain, redness, or swelling with sores inside the mouth or throat Sensitivity to light Vomiting This list may not describe all possible side effects. Call your doctor for medical advice about side effects. You may report side effects to FDA at 1-800-FDA-1088. Where should I keep my medication? This medication is given in a hospital or clinic. It will not be stored at home. NOTE: This sheet is a summary. It may not cover all possible information. If you have questions about this medicine, talk to your doctor, pharmacist, or health care provider.  2024 Elsevier/Gold Standard (2021-07-20 00:00:00)       To help prevent nausea and vomiting after your treatment, we encourage you to take your nausea medication as directed.  BELOW ARE SYMPTOMS THAT SHOULD BE REPORTED IMMEDIATELY: *FEVER GREATER THAN 100.4 F (38 C) OR HIGHER *CHILLS OR SWEATING *NAUSEA AND VOMITING THAT IS NOT CONTROLLED WITH YOUR NAUSEA MEDICATION *UNUSUAL SHORTNESS OF BREATH *UNUSUAL BRUISING OR BLEEDING *URINARY PROBLEMS (pain or burning when urinating, or frequent urination) *BOWEL PROBLEMS (unusual diarrhea, constipation, pain near the anus) TENDERNESS IN MOUTH AND THROAT WITH OR WITHOUT PRESENCE OF ULCERS (sore throat, sores in mouth, or a toothache) UNUSUAL RASH, SWELLING OR PAIN  UNUSUAL VAGINAL DISCHARGE OR ITCHING   Items with * indicate a potential emergency and should be followed up as soon as possible or go to the Emergency Department if any problems should occur.  Please show the CHEMOTHERAPY ALERT CARD or IMMUNOTHERAPY ALERT CARD at check-in to the Emergency Department and triage nurse.  Should you have questions after your visit or need to cancel or reschedule your appointment, please contact [facility] [place] - A DEPT OF [facility] [phone]  and follow the prompts.  Office  hours are 8:00 a.m. to 4:30 p.m. Monday - Friday. Please note that voicemails left after 4:00 p.m. may not be returned until the following business day.  We are closed weekends and major holidays. You have access to a nurse at all times for urgent questions. Please call the main number to the clinic [phone] and follow the prompts.  For any non-urgent questions, you may also contact your provider using MyChart. We now offer e-Visits for anyone 49 and older to request care online for non-urgent symptoms. For details visit [email or web address].   Also download the MyChart app! Go to the app store, search "MyChart", open the app, select , and log in with your MyChart username and password.

## 2023-04-26 NOTE — Patient Instructions (Signed)
[facility] at [facility] Discharge Instructions   You were seen and examined today by Dr. Ellin Saba.  He reviewed the results of your lab work which are normal/stable.   He reviewed the results of your PET scan which shows improvement in the cancer.   We will proceed with your treatment today.   Return as scheduled.    Thank you for choosing  Cancer Center at [facility] to provide your oncology and hematology care.  To afford each patient quality time with our provider, please arrive at least 15 minutes before your scheduled appointment time.   If you have a lab appointment with the Cancer Center please come in thru the Main Entrance and check in at the main information desk.  You need to re-schedule your appointment should you arrive 10 or more minutes late.  We strive to give you quality time with our providers, and arriving late affects you and other patients whose appointments are after yours.  Also, if you no show three or more times for appointments you may be dismissed from the clinic at the providers discretion.     Again, thank you for choosing [facility].  Our hope is that these requests will decrease the amount of time that you wait before being seen by our physicians.       _____________________________________________________________  Should you have questions after your visit to [facility], please contact our office at [phone] and follow the prompts.  Our office hours are 8:00 a.m. and 4:30 p.m. Monday - Friday.  Please note that voicemails left after 4:00 p.m. may not be returned until the following business day.  We are closed weekends and major holidays.  You do have access to a nurse 24-7, just call the main number to the clinic [phone] and do not press any options, hold on the line and a nurse will answer the phone.    For prescription refill requests, have your pharmacy contact our office  and allow 72 hours.    Due to Covid, you will need to wear a mask upon entering the hospital. If you do not have a mask, a mask will be given to you at the Main Entrance upon arrival. For doctor visits, patients may have 1 support person age 43 or older with them. For treatment visits, patients can not have anyone with them due to social distancing guidelines and our immunocompromised population.

## 2023-04-26 NOTE — Progress Notes (Signed)
Patient has been examined by Dr. Ellin Saba. Vital signs (HR 106) and labs have been reviewed by MD - ANC (1.1), Creatinine, LFTs (total bilirubin 2.2), hemoglobin, and platelets (58) are within treatment parameters per M.D. - pt may proceed with treatment. Primary RN and pharmacy notified.

## 2023-04-28 ENCOUNTER — Inpatient Hospital Stay: Payer: 59

## 2023-04-28 VITALS — BP 97/70 | HR 87 | Temp 97.8°F | Resp 18

## 2023-04-28 DIAGNOSIS — D696 Thrombocytopenia, unspecified: Secondary | ICD-10-CM | POA: Diagnosis not present

## 2023-04-28 DIAGNOSIS — D72819 Decreased white blood cell count, unspecified: Secondary | ICD-10-CM | POA: Diagnosis not present

## 2023-04-28 DIAGNOSIS — C7971 Secondary malignant neoplasm of right adrenal gland: Secondary | ICD-10-CM | POA: Diagnosis not present

## 2023-04-28 DIAGNOSIS — Z5112 Encounter for antineoplastic immunotherapy: Secondary | ICD-10-CM | POA: Diagnosis not present

## 2023-04-28 DIAGNOSIS — I700 Atherosclerosis of aorta: Secondary | ICD-10-CM | POA: Diagnosis not present

## 2023-04-28 DIAGNOSIS — C7931 Secondary malignant neoplasm of brain: Secondary | ICD-10-CM | POA: Diagnosis not present

## 2023-04-28 DIAGNOSIS — C20 Malignant neoplasm of rectum: Secondary | ICD-10-CM

## 2023-04-28 DIAGNOSIS — Z5111 Encounter for antineoplastic chemotherapy: Secondary | ICD-10-CM | POA: Diagnosis not present

## 2023-04-28 DIAGNOSIS — C787 Secondary malignant neoplasm of liver and intrahepatic bile duct: Secondary | ICD-10-CM | POA: Diagnosis not present

## 2023-04-28 DIAGNOSIS — R5383 Other fatigue: Secondary | ICD-10-CM | POA: Diagnosis not present

## 2023-04-28 MED ORDER — SODIUM CHLORIDE 0.9% FLUSH
10.0000 mL | INTRAVENOUS | Status: DC | PRN
  Administered 2023-04-28: 10 mL

## 2023-04-28 MED ORDER — HEPARIN SOD (PORK) LOCK FLUSH 100 UNIT/ML IV SOLN
500.0000 [IU] | Freq: Once | INTRAVENOUS | Status: AC | PRN
  Administered 2023-04-28: 500 [IU]

## 2023-04-28 NOTE — Progress Notes (Signed)
 Patient presents today for 5FU pump stop and disconnection after 46 hour continous infusion.   5FU pump deaccessed.  Patients port flushed without difficulty.  Good blood return noted with no bruising or swelling noted at site.  Needle removed intact.  Band aid applied.  VSS with discharge and left in satisfactory condition via wheelchair with no s/s of distress noted.

## 2023-05-04 ENCOUNTER — Encounter: Payer: Self-pay | Admitting: Hematology

## 2023-05-04 ENCOUNTER — Other Ambulatory Visit (HOSPITAL_COMMUNITY): Payer: Self-pay

## 2023-05-04 ENCOUNTER — Other Ambulatory Visit: Payer: Self-pay

## 2023-05-04 ENCOUNTER — Encounter (HOSPITAL_COMMUNITY): Payer: Self-pay | Admitting: Hematology

## 2023-05-10 ENCOUNTER — Telehealth: Payer: Self-pay | Admitting: *Deleted

## 2023-05-10 NOTE — Telephone Encounter (Signed)
Message received to locate FMLA form believed to be sent within the last two months.  FMLA form noted in Media, Notes and Correspondence entry 06/14/2022 signed by provider 06/13/2022.  No forms noted on excel tracker or any [email or web address] email folders checked from August 2024 to present.  Advised RN of above and a new form and signed ROI is needed.  Joyceann Kruser Goatley needs to connect with [employer] who send a notice 21-days before a claim expires to ask if 82-month claim needs to continue or annual renewal with a new claim number to receive provider certification by end of 21-days.

## 2023-05-15 ENCOUNTER — Encounter: Payer: Self-pay | Admitting: Hematology

## 2023-05-16 ENCOUNTER — Telehealth: Payer: Self-pay

## 2023-05-16 NOTE — Telephone Encounter (Signed)
Notified pt of completed FMLA forms. Pt verbalized understanding.

## 2023-05-24 ENCOUNTER — Inpatient Hospital Stay: Payer: 59 | Attending: Hematology

## 2023-05-24 ENCOUNTER — Encounter: Payer: Self-pay | Admitting: Hematology

## 2023-05-24 ENCOUNTER — Other Ambulatory Visit: Payer: Self-pay

## 2023-05-24 ENCOUNTER — Encounter: Payer: Self-pay | Admitting: Family Medicine

## 2023-05-24 ENCOUNTER — Inpatient Hospital Stay: Payer: 59

## 2023-05-24 VITALS — BP 113/82 | HR 86 | Temp 96.9°F | Resp 18 | Wt 133.6 lb

## 2023-05-24 VITALS — BP 121/84 | HR 100 | Temp 98.7°F | Resp 19

## 2023-05-24 DIAGNOSIS — C7931 Secondary malignant neoplasm of brain: Secondary | ICD-10-CM | POA: Insufficient documentation

## 2023-05-24 DIAGNOSIS — Z79631 Long term (current) use of antimetabolite agent: Secondary | ICD-10-CM | POA: Insufficient documentation

## 2023-05-24 DIAGNOSIS — Z87891 Personal history of nicotine dependence: Secondary | ICD-10-CM | POA: Diagnosis not present

## 2023-05-24 DIAGNOSIS — D696 Thrombocytopenia, unspecified: Secondary | ICD-10-CM | POA: Diagnosis not present

## 2023-05-24 DIAGNOSIS — Z801 Family history of malignant neoplasm of trachea, bronchus and lung: Secondary | ICD-10-CM | POA: Insufficient documentation

## 2023-05-24 DIAGNOSIS — Z5112 Encounter for antineoplastic immunotherapy: Secondary | ICD-10-CM | POA: Insufficient documentation

## 2023-05-24 DIAGNOSIS — Z8042 Family history of malignant neoplasm of prostate: Secondary | ICD-10-CM | POA: Diagnosis not present

## 2023-05-24 DIAGNOSIS — Z79899 Other long term (current) drug therapy: Secondary | ICD-10-CM | POA: Diagnosis not present

## 2023-05-24 DIAGNOSIS — Z808 Family history of malignant neoplasm of other organs or systems: Secondary | ICD-10-CM | POA: Diagnosis not present

## 2023-05-24 DIAGNOSIS — E063 Autoimmune thyroiditis: Secondary | ICD-10-CM

## 2023-05-24 DIAGNOSIS — Z5111 Encounter for antineoplastic chemotherapy: Secondary | ICD-10-CM | POA: Insufficient documentation

## 2023-05-24 DIAGNOSIS — C20 Malignant neoplasm of rectum: Secondary | ICD-10-CM

## 2023-05-24 DIAGNOSIS — C787 Secondary malignant neoplasm of liver and intrahepatic bile duct: Secondary | ICD-10-CM | POA: Diagnosis not present

## 2023-05-24 DIAGNOSIS — Z8249 Family history of ischemic heart disease and other diseases of the circulatory system: Secondary | ICD-10-CM | POA: Diagnosis not present

## 2023-05-24 DIAGNOSIS — D72829 Elevated white blood cell count, unspecified: Secondary | ICD-10-CM | POA: Diagnosis not present

## 2023-05-24 DIAGNOSIS — Z95828 Presence of other vascular implants and grafts: Secondary | ICD-10-CM

## 2023-05-24 LAB — CBC WITH DIFFERENTIAL/PLATELET
Abs Immature Granulocytes: 0.01 10*3/uL (ref 0.00–0.07)
Basophils Absolute: 0 10*3/uL (ref 0.0–0.1)
Basophils Relative: 1 %
Eosinophils Absolute: 0 10*3/uL (ref 0.0–0.5)
Eosinophils Relative: 1 %
HCT: 33.8 % — ABNORMAL LOW (ref 36.0–46.0)
Hemoglobin: 11.2 g/dL — ABNORMAL LOW (ref 12.0–15.0)
Immature Granulocytes: 1 %
Lymphocytes Relative: 22 %
Lymphs Abs: 0.5 10*3/uL — ABNORMAL LOW (ref 0.7–4.0)
MCH: 33.6 pg (ref 26.0–34.0)
MCHC: 33.1 g/dL (ref 30.0–36.0)
MCV: 101.5 fL — ABNORMAL HIGH (ref 80.0–100.0)
Monocytes Absolute: 0.2 10*3/uL (ref 0.1–1.0)
Monocytes Relative: 9 %
Neutro Abs: 1.4 10*3/uL — ABNORMAL LOW (ref 1.7–7.7)
Neutrophils Relative %: 66 %
Platelets: 53 10*3/uL — ABNORMAL LOW (ref 150–400)
RBC: 3.33 MIL/uL — ABNORMAL LOW (ref 3.87–5.11)
RDW: 14.2 % (ref 11.5–15.5)
Smear Review: DECREASED
WBC: 2.1 10*3/uL — ABNORMAL LOW (ref 4.0–10.5)
nRBC: 0 % (ref 0.0–0.2)

## 2023-05-24 LAB — COMPREHENSIVE METABOLIC PANEL
ALT: 14 U/L (ref 0–44)
AST: 24 U/L (ref 15–41)
Albumin: 3.6 g/dL (ref 3.5–5.0)
Alkaline Phosphatase: 120 U/L (ref 38–126)
Anion gap: 11 (ref 5–15)
BUN: 12 mg/dL (ref 6–20)
CO2: 23 mmol/L (ref 22–32)
Calcium: 8.8 mg/dL — ABNORMAL LOW (ref 8.9–10.3)
Chloride: 104 mmol/L (ref 98–111)
Creatinine, Ser: 0.79 mg/dL (ref 0.44–1.00)
GFR, Estimated: >60 mL/min (ref >60)
Glucose, Bld: 84 mg/dL (ref 70–99)
Potassium: 3.2 mmol/L — ABNORMAL LOW (ref 3.5–5.1)
Sodium: 138 mmol/L (ref 135–145)
Total Bilirubin: 2.6 mg/dL — ABNORMAL HIGH (ref 0.0–1.2)
Total Protein: 6.7 g/dL (ref 6.5–8.1)

## 2023-05-24 LAB — MAGNESIUM: Magnesium: 2 mg/dL (ref 1.7–2.4)

## 2023-05-24 MED ORDER — SODIUM CHLORIDE 0.9 % IV SOLN
5.0000 mg/kg | Freq: Once | INTRAVENOUS | Status: AC
  Administered 2023-05-24: 300 mg via INTRAVENOUS
  Filled 2023-05-24: qty 12

## 2023-05-24 MED ORDER — SODIUM CHLORIDE 0.9 % IV SOLN
1920.0000 mg/m2 | INTRAVENOUS | Status: DC
  Administered 2023-05-24: 3500 mg via INTRAVENOUS
  Filled 2023-05-24: qty 70

## 2023-05-24 MED ORDER — SODIUM CHLORIDE 0.9 % IV SOLN
Freq: Once | INTRAVENOUS | Status: AC
Start: 2023-05-24 — End: 2023-05-24

## 2023-05-24 MED ORDER — FLUOROURACIL CHEMO INJECTION 500 MG/10ML
320.0000 mg/m2 | Freq: Once | INTRAVENOUS | Status: AC
  Administered 2023-05-24: 500 mg via INTRAVENOUS
  Filled 2023-05-24: qty 10

## 2023-05-24 MED ORDER — SODIUM CHLORIDE FLUSH 0.9 % IV SOLN
10.0000 mL | Freq: Once | INTRAVENOUS | Status: AC
  Administered 2023-05-24: 10 mL via INTRAVENOUS
  Filled 2023-05-24: qty 10

## 2023-05-24 MED ORDER — DEXAMETHASONE SODIUM PHOSPHATE 10 MG/ML IJ SOLN
10.0000 mg | Freq: Once | INTRAMUSCULAR | Status: AC
  Administered 2023-05-24: 10 mg via INTRAVENOUS
  Filled 2023-05-24: qty 1

## 2023-05-24 MED ORDER — FAMOTIDINE IN NACL 20-0.9 MG/50ML-% IV SOLN
20.0000 mg | Freq: Once | INTRAVENOUS | Status: AC
  Administered 2023-05-24: 20 mg via INTRAVENOUS
  Filled 2023-05-24: qty 50

## 2023-05-24 MED ORDER — PALONOSETRON HCL INJECTION 0.25 MG/5ML
0.2500 mg | Freq: Once | INTRAVENOUS | Status: AC
  Administered 2023-05-24: 0.25 mg via INTRAVENOUS
  Filled 2023-05-24: qty 5

## 2023-05-24 MED ORDER — SODIUM CHLORIDE 0.9 % IV SOLN
320.0000 mg/m2 | Freq: Once | INTRAVENOUS | Status: AC
  Administered 2023-05-24: 540 mg via INTRAVENOUS
  Filled 2023-05-24: qty 27

## 2023-05-24 NOTE — Patient Instructions (Signed)
 [facility] - A DEPT OF [facility]  Discharge Instructions: Thank you for choosing [facility] to provide your oncology and hematology care.  If you have a lab appointment with the Cancer Center - please note that after April 8th, 2024, all labs will be drawn in the cancer center.  You do not have to check in or register with the main entrance as you have in the past but will complete your check-in in the cancer center.  Wear comfortable clothing and clothing appropriate for easy access to any Portacath or PICC line.   We strive to give you quality time with your provider. You may need to reschedule your appointment if you arrive late (15 or more minutes).  Arriving late affects you and other patients whose appointments are after yours.  Also, if you miss three or more appointments without notifying the office, you may be dismissed from the clinic at the provider's discretion.      For prescription refill requests, have your pharmacy contact our office and allow 72 hours for refills to be completed.    Today you received the following chemotherapy and/or immunotherapy agents MVASI/Leucovorin/5FU   To help prevent nausea and vomiting after your treatment, we encourage you to take your nausea medication as directed.  BELOW ARE SYMPTOMS THAT SHOULD BE REPORTED IMMEDIATELY: *FEVER GREATER THAN 100.4 F (38 C) OR HIGHER *CHILLS OR SWEATING *NAUSEA AND VOMITING THAT IS NOT CONTROLLED WITH YOUR NAUSEA MEDICATION *UNUSUAL SHORTNESS OF BREATH *UNUSUAL BRUISING OR BLEEDING *URINARY PROBLEMS (pain or burning when urinating, or frequent urination) *BOWEL PROBLEMS (unusual diarrhea, constipation, pain near the anus) TENDERNESS IN MOUTH AND THROAT WITH OR WITHOUT PRESENCE OF ULCERS (sore throat, sores in mouth, or a toothache) UNUSUAL RASH, SWELLING OR PAIN  UNUSUAL VAGINAL DISCHARGE OR ITCHING   Items with * indicate a potential emergency and should be  followed up as soon as possible or go to the Emergency Department if any problems should occur.  Please show the CHEMOTHERAPY ALERT CARD or IMMUNOTHERAPY ALERT CARD at check-in to the Emergency Department and triage nurse.  Should you have questions after your visit or need to cancel or reschedule your appointment, please contact [facility] [place] - A DEPT OF [facility] [phone]  and follow the prompts.  Office hours are 8:00 a.m. to 4:30 p.m. Monday - Friday. Please note that voicemails left after 4:00 p.m. may not be returned until the following business day.  We are closed weekends and major holidays. You have access to a nurse at all times for urgent questions. Please call the main number to the clinic [phone] and follow the prompts.  For any non-urgent questions, you may also contact your provider using MyChart. We now offer e-Visits for anyone 82 and older to request care online for non-urgent symptoms. For details visit [email or web address].   Also download the MyChart app! Go to the app store, search "MyChart", open the app, select [facility], and log in with your MyChart username and password.

## 2023-05-24 NOTE — Progress Notes (Signed)
 Patient presents today for MVASI/Leucovorin/5FU infusion. Patient is in satisfactory condition with no new complaints voiced.  Vital signs are stable.  Labs reviewed and all other labs are within treatment parameters. Patient's potassium noted to be 3.2 today. Patient refused standing orders. Patient's ANC 1.4, Bilirubin 2.6, and Platelets 53. Dr.Katradagga made aware.  We will proceed with treatment per MD orders.    Treatment given today per MD orders. Tolerated infusion without adverse affects. Vital signs stable. No complaints at this time. Discharged from clinic ambulatory in stable condition. Alert and oriented x 3. F/U with [facility] as scheduled. 5FU ambulatory pump infusing.

## 2023-05-24 NOTE — Telephone Encounter (Signed)
 Nurses Please go ahead and order TSH and free T4 Then send Darling a message She can get this drawn at her next blood draw through the cancer center thank you

## 2023-05-25 LAB — CEA: CEA: 3.2 ng/mL (ref 0.0–4.7)

## 2023-05-26 ENCOUNTER — Inpatient Hospital Stay: Payer: 59

## 2023-05-26 VITALS — BP 111/73 | HR 88 | Temp 97.4°F | Resp 18

## 2023-05-26 DIAGNOSIS — D72829 Elevated white blood cell count, unspecified: Secondary | ICD-10-CM | POA: Diagnosis not present

## 2023-05-26 DIAGNOSIS — C20 Malignant neoplasm of rectum: Secondary | ICD-10-CM | POA: Diagnosis not present

## 2023-05-26 DIAGNOSIS — C7931 Secondary malignant neoplasm of brain: Secondary | ICD-10-CM | POA: Diagnosis not present

## 2023-05-26 DIAGNOSIS — Z79631 Long term (current) use of antimetabolite agent: Secondary | ICD-10-CM | POA: Diagnosis not present

## 2023-05-26 DIAGNOSIS — Z5111 Encounter for antineoplastic chemotherapy: Secondary | ICD-10-CM | POA: Diagnosis not present

## 2023-05-26 DIAGNOSIS — D696 Thrombocytopenia, unspecified: Secondary | ICD-10-CM | POA: Diagnosis not present

## 2023-05-26 DIAGNOSIS — Z87891 Personal history of nicotine dependence: Secondary | ICD-10-CM | POA: Diagnosis not present

## 2023-05-26 DIAGNOSIS — Z79899 Other long term (current) drug therapy: Secondary | ICD-10-CM | POA: Diagnosis not present

## 2023-05-26 DIAGNOSIS — Z5112 Encounter for antineoplastic immunotherapy: Secondary | ICD-10-CM | POA: Diagnosis not present

## 2023-05-26 DIAGNOSIS — Z8249 Family history of ischemic heart disease and other diseases of the circulatory system: Secondary | ICD-10-CM | POA: Diagnosis not present

## 2023-05-26 MED ORDER — HEPARIN SOD (PORK) LOCK FLUSH 100 UNIT/ML IV SOLN
500.0000 [IU] | Freq: Once | INTRAVENOUS | Status: AC | PRN
  Administered 2023-05-26: 500 [IU]

## 2023-05-26 MED ORDER — SODIUM CHLORIDE 0.9% FLUSH
10.0000 mL | INTRAVENOUS | Status: DC | PRN
  Administered 2023-05-26: 10 mL

## 2023-05-26 NOTE — Progress Notes (Signed)
 Pump disconnected and Port flushed with good blood return noted. No bruising or swelling at site. Bandaid applied and patient discharged in satisfactory condition. VVS stable with no signs or symptoms of distressed noted.

## 2023-06-06 ENCOUNTER — Other Ambulatory Visit: Payer: Self-pay

## 2023-06-19 ENCOUNTER — Other Ambulatory Visit: Payer: Self-pay

## 2023-06-21 ENCOUNTER — Inpatient Hospital Stay: Payer: 59

## 2023-06-21 ENCOUNTER — Other Ambulatory Visit (HOSPITAL_COMMUNITY): Payer: Self-pay

## 2023-06-21 ENCOUNTER — Inpatient Hospital Stay: Payer: 59 | Attending: Hematology | Admitting: Hematology

## 2023-06-21 VITALS — BP 120/82 | HR 84 | Temp 97.8°F | Resp 18

## 2023-06-21 VITALS — BP 134/79 | HR 97 | Temp 97.6°F | Resp 19 | Wt 135.0 lb

## 2023-06-21 DIAGNOSIS — Z808 Family history of malignant neoplasm of other organs or systems: Secondary | ICD-10-CM | POA: Diagnosis not present

## 2023-06-21 DIAGNOSIS — C787 Secondary malignant neoplasm of liver and intrahepatic bile duct: Secondary | ICD-10-CM | POA: Insufficient documentation

## 2023-06-21 DIAGNOSIS — Z5112 Encounter for antineoplastic immunotherapy: Secondary | ICD-10-CM | POA: Diagnosis present

## 2023-06-21 DIAGNOSIS — Z801 Family history of malignant neoplasm of trachea, bronchus and lung: Secondary | ICD-10-CM | POA: Insufficient documentation

## 2023-06-21 DIAGNOSIS — Z9221 Personal history of antineoplastic chemotherapy: Secondary | ICD-10-CM | POA: Diagnosis not present

## 2023-06-21 DIAGNOSIS — Z885 Allergy status to narcotic agent status: Secondary | ICD-10-CM | POA: Diagnosis not present

## 2023-06-21 DIAGNOSIS — R6889 Other general symptoms and signs: Secondary | ICD-10-CM | POA: Insufficient documentation

## 2023-06-21 DIAGNOSIS — Z79899 Other long term (current) drug therapy: Secondary | ICD-10-CM | POA: Insufficient documentation

## 2023-06-21 DIAGNOSIS — Z85850 Personal history of malignant neoplasm of thyroid: Secondary | ICD-10-CM | POA: Diagnosis not present

## 2023-06-21 DIAGNOSIS — Z881 Allergy status to other antibiotic agents status: Secondary | ICD-10-CM | POA: Diagnosis not present

## 2023-06-21 DIAGNOSIS — Z8249 Family history of ischemic heart disease and other diseases of the circulatory system: Secondary | ICD-10-CM | POA: Diagnosis not present

## 2023-06-21 DIAGNOSIS — C7931 Secondary malignant neoplasm of brain: Secondary | ICD-10-CM | POA: Insufficient documentation

## 2023-06-21 DIAGNOSIS — R152 Fecal urgency: Secondary | ICD-10-CM | POA: Diagnosis not present

## 2023-06-21 DIAGNOSIS — Z79890 Hormone replacement therapy (postmenopausal): Secondary | ICD-10-CM | POA: Diagnosis not present

## 2023-06-21 DIAGNOSIS — Z8042 Family history of malignant neoplasm of prostate: Secondary | ICD-10-CM | POA: Insufficient documentation

## 2023-06-21 DIAGNOSIS — Z9049 Acquired absence of other specified parts of digestive tract: Secondary | ICD-10-CM | POA: Insufficient documentation

## 2023-06-21 DIAGNOSIS — C20 Malignant neoplasm of rectum: Secondary | ICD-10-CM | POA: Insufficient documentation

## 2023-06-21 DIAGNOSIS — Z5111 Encounter for antineoplastic chemotherapy: Secondary | ICD-10-CM | POA: Insufficient documentation

## 2023-06-21 DIAGNOSIS — D72819 Decreased white blood cell count, unspecified: Secondary | ICD-10-CM | POA: Diagnosis not present

## 2023-06-21 DIAGNOSIS — D696 Thrombocytopenia, unspecified: Secondary | ICD-10-CM | POA: Insufficient documentation

## 2023-06-21 DIAGNOSIS — Z923 Personal history of irradiation: Secondary | ICD-10-CM | POA: Diagnosis not present

## 2023-06-21 DIAGNOSIS — Z880 Allergy status to penicillin: Secondary | ICD-10-CM | POA: Diagnosis not present

## 2023-06-21 DIAGNOSIS — Z87891 Personal history of nicotine dependence: Secondary | ICD-10-CM | POA: Diagnosis not present

## 2023-06-21 DIAGNOSIS — E876 Hypokalemia: Secondary | ICD-10-CM | POA: Diagnosis not present

## 2023-06-21 LAB — CBC WITH DIFFERENTIAL/PLATELET
Abs Immature Granulocytes: 0.01 10*3/uL (ref 0.00–0.07)
Basophils Absolute: 0 10*3/uL (ref 0.0–0.1)
Basophils Relative: 1 %
Eosinophils Absolute: 0 10*3/uL (ref 0.0–0.5)
Eosinophils Relative: 1 %
HCT: 33.1 % — ABNORMAL LOW (ref 36.0–46.0)
Hemoglobin: 10.9 g/dL — ABNORMAL LOW (ref 12.0–15.0)
Immature Granulocytes: 1 %
Lymphocytes Relative: 20 %
Lymphs Abs: 0.4 10*3/uL — ABNORMAL LOW (ref 0.7–4.0)
MCH: 34 pg (ref 26.0–34.0)
MCHC: 32.9 g/dL (ref 30.0–36.0)
MCV: 103.1 fL — ABNORMAL HIGH (ref 80.0–100.0)
Monocytes Absolute: 0.1 10*3/uL (ref 0.1–1.0)
Monocytes Relative: 7 %
Neutro Abs: 1.3 10*3/uL — ABNORMAL LOW (ref 1.7–7.7)
Neutrophils Relative %: 70 %
Platelets: 48 10*3/uL — ABNORMAL LOW (ref 150–400)
RBC: 3.21 MIL/uL — ABNORMAL LOW (ref 3.87–5.11)
RDW: 14.8 % (ref 11.5–15.5)
Smear Review: DECREASED
WBC: 1.9 10*3/uL — ABNORMAL LOW (ref 4.0–10.5)
nRBC: 0 % (ref 0.0–0.2)

## 2023-06-21 LAB — COMPREHENSIVE METABOLIC PANEL
ALT: 14 U/L (ref 0–44)
AST: 26 U/L (ref 15–41)
Albumin: 3.5 g/dL (ref 3.5–5.0)
Alkaline Phosphatase: 114 U/L (ref 38–126)
Anion gap: 9 (ref 5–15)
BUN: 13 mg/dL (ref 6–20)
CO2: 24 mmol/L (ref 22–32)
Calcium: 8.6 mg/dL — ABNORMAL LOW (ref 8.9–10.3)
Chloride: 105 mmol/L (ref 98–111)
Creatinine, Ser: 0.83 mg/dL (ref 0.44–1.00)
GFR, Estimated: >60 mL/min (ref >60)
Glucose, Bld: 96 mg/dL (ref 70–99)
Potassium: 3.2 mmol/L — ABNORMAL LOW (ref 3.5–5.1)
Sodium: 138 mmol/L (ref 135–145)
Total Bilirubin: 1.9 mg/dL — ABNORMAL HIGH (ref 0.0–1.2)
Total Protein: 6.3 g/dL — ABNORMAL LOW (ref 6.5–8.1)

## 2023-06-21 LAB — MAGNESIUM: Magnesium: 2 mg/dL (ref 1.7–2.4)

## 2023-06-21 LAB — URINALYSIS, DIPSTICK ONLY
Bilirubin Urine: NEGATIVE
Glucose, UA: NEGATIVE mg/dL
Hgb urine dipstick: NEGATIVE
Ketones, ur: NEGATIVE mg/dL
Nitrite: NEGATIVE
Protein, ur: NEGATIVE mg/dL
Specific Gravity, Urine: 1.015 (ref 1.005–1.030)
pH: 6 (ref 5.0–8.0)

## 2023-06-21 LAB — T4, FREE: Free T4: 0.65 ng/dL (ref 0.61–1.12)

## 2023-06-21 LAB — TSH: TSH: 6.24 u[IU]/mL — ABNORMAL HIGH (ref 0.350–4.500)

## 2023-06-21 MED ORDER — POTASSIUM CHLORIDE 10 % PO SOLN
20.0000 meq | Freq: Every day | ORAL | 2 refills | Status: DC
  Filled 2023-06-21: qty 900, 60d supply, fill #0

## 2023-06-21 MED ORDER — FAMOTIDINE IN NACL 20-0.9 MG/50ML-% IV SOLN
20.0000 mg | Freq: Once | INTRAVENOUS | Status: AC
  Administered 2023-06-21: 20 mg via INTRAVENOUS
  Filled 2023-06-21: qty 50

## 2023-06-21 MED ORDER — SODIUM CHLORIDE 0.9 % IV SOLN
1920.0000 mg/m2 | INTRAVENOUS | Status: DC
  Administered 2023-06-21: 3500 mg via INTRAVENOUS
  Filled 2023-06-21: qty 70

## 2023-06-21 MED ORDER — SODIUM CHLORIDE 0.9 % IV SOLN
Freq: Once | INTRAVENOUS | Status: AC
Start: 2023-06-21 — End: 2023-06-21

## 2023-06-21 MED ORDER — PALONOSETRON HCL INJECTION 0.25 MG/5ML
0.2500 mg | Freq: Once | INTRAVENOUS | Status: AC
  Administered 2023-06-21: 0.25 mg via INTRAVENOUS
  Filled 2023-06-21: qty 5

## 2023-06-21 MED ORDER — SODIUM CHLORIDE 0.9 % IV SOLN
5.0000 mg/kg | Freq: Once | INTRAVENOUS | Status: AC
  Administered 2023-06-21: 300 mg via INTRAVENOUS
  Filled 2023-06-21: qty 12

## 2023-06-21 MED ORDER — LEUCOVORIN CALCIUM INJECTION 350 MG
320.0000 mg/m2 | Freq: Once | INTRAVENOUS | Status: AC
  Administered 2023-06-21: 540 mg via INTRAVENOUS
  Filled 2023-06-21: qty 27

## 2023-06-21 MED ORDER — FLUOROURACIL CHEMO INJECTION 500 MG/10ML
320.0000 mg/m2 | Freq: Once | INTRAVENOUS | Status: AC
  Administered 2023-06-21: 500 mg via INTRAVENOUS
  Filled 2023-06-21: qty 10

## 2023-06-21 MED ORDER — DEXAMETHASONE SODIUM PHOSPHATE 10 MG/ML IJ SOLN
10.0000 mg | Freq: Once | INTRAMUSCULAR | Status: AC
  Administered 2023-06-21: 10 mg via INTRAVENOUS
  Filled 2023-06-21: qty 1

## 2023-06-21 MED ORDER — SODIUM CHLORIDE 0.9% FLUSH
10.0000 mL | INTRAVENOUS | Status: DC | PRN
  Administered 2023-06-21: 10 mL via INTRAVENOUS

## 2023-06-21 MED ORDER — POTASSIUM CHLORIDE CRYS ER 20 MEQ PO TBCR
40.0000 meq | EXTENDED_RELEASE_TABLET | Freq: Once | ORAL | Status: DC
  Filled 2023-06-21: qty 2

## 2023-06-21 NOTE — Patient Instructions (Signed)

## 2023-06-21 NOTE — Progress Notes (Signed)
 Patient presents today for chemotherapy infusion. Patient is in satisfactory condition with no new complaints voiced.  Vital signs are stable.  Labs reviewed by Dr. Ellin Saba during the office visit.  Bilirubin is 1.9, platelets are 48, and ANC is 1.3.  MD aware.  Potassium today is 3.2.  Patient refused Klor Con 40 mEq standing orders by Dr. Ellin Saba.  Urine protein is negative.  We will proceed with treatment per MD orders.   Patient tolerated treatment well with no complaints voiced.  Home infusion 5FU pump connected.  Patient left ambulatory with husband in stable condition.  Vital signs stable at discharge.  Follow up as scheduled.

## 2023-06-21 NOTE — Progress Notes (Signed)
 [facility] [address], [place] [address]    Clinic Day:  06/21/2023  Referring physician: Babs Sciara, MD  Patient Care Team: Babs Sciara, MD as PCP - General (Family Medicine) [name], MD as Medical Oncologist (Medical Oncology)   ASSESSMENT & PLAN:   Assessment: 1.  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  Liver biopsy on 03/09/2018 consistent with adenocarcinoma. 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 -7 cycles of FOLFOX with vectibix completed on 06/27/2018. -XRT with Xeloda from 07/30/2018 through 09/06/2018. -Right liver lesion microwave ablation on 10/15/2018 at [facility] -Low anterior resection and diverting ileostomy on 12/07/2018, pathology [id number], 0/6 lymph nodes positive, margins negative. -Loop ileostomy reversal on 04/17/2019. - Microwave ablation of the right hepatic lobe lesion by Dr. Selena Batten around 08/15/2019. -SBRT to the left lower lobe lung lesion and left liver lobe lesion on 12/13/2019 at [facility]. - Right upper lobe lesion SBRT from 08/06/2020 through 08/13/2020. - 3 left lung lesions and subcarinal lymph node IMRT from 09/21/2021 through 10/05/2021. - Liver biopsy (06/09/2022): Metastatic moderately differentiated colonic adenocarcinoma. - NGS: KRAS/NRAS/BRAF wild-type, MSI-stable, HER2-0, negative for other targetable mutations - Cycle 1 of FOLFOX and bevacizumab on 07/06/2022, oxaliplatin discontinued from 11/09/2022 (cycle 8), 5-FU and bevacizumab maintenance changed to every 3 weeks on 01/04/2023 - XRT to the right neck from 07/14/2022 through 08/03/2022    Plan: 1.  Stage IV rectal adenocarcinoma, BRAF/RAS wild-type, MSI-stable: - At last visit on 04/26/2023, we switched her maintenance 5-FU and bevacizumab to every 4 weeks for better tolerability and quality of life. - She denies any infections.  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  She denies any bleeding issues.  She has dry eyes and will use artificial tears. - Reviewed labs today: Normal LFTs and total bilirubin 1.9.  Platelet count 48K and white count 1.9 with ANC of 1.3.  UA was negative for protein. - Will proceed with maintenance 5-FU and bevacizumab with 20% dose reduction every 4 weeks.  RTC 12 weeks for follow-up with repeat PET scan.   2.  Leukopenia and thrombocytopenia: - She has leukopenia and thrombocytopenia worsened by splenomegaly.  Will continue close monitoring.   3.  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  Unchanged subcentimeter enhancing lesion in the left frontal calvarium.  4.  Hypokalemia: - Potassium has been consistently low.  She could not swallow potassium pills since she had radiation to the neck area. - We will start her on either powder or liquid potassium 20 mill equivalents daily.    Orders Placed This Encounter  Procedures   NM PET Image Restag (PS) Skull Base To Thigh    Standing Status:   Future    Expected Date:   08/10/2023    Expiration Date:   06/20/2024    If indicated for the ordered procedure, I authorize the administration of a radiopharmaceutical per Radiology protocol:   Yes    Is the patient pregnant?:   No    Preferred imaging location?:   [facility]    Standing Status:   Future    Expected Date:   08/15/2023    Expiration Date:   08/14/2024   Magnesium    Standing Status:   Future    Expected Date:   08/15/2023    Expiration Date:   08/14/2024  CBC with Differential    Standing Status:   Future    Expected Date:   08/15/2023    Expiration Date:   08/14/2024   Comprehensive metabolic panel    Standing Status:   Future    Expected Date:   08/15/2023    Expiration Date:   08/14/2024   Urinalysis, dipstick only    Standing Status:   Future    Expected Date:   08/15/2023    Expiration Date:   08/14/2024   CEA     Standing Status:   Future    Expected Date:   09/12/2023    Expiration Date:   09/11/2024   Magnesium    Standing Status:   Future    Expected Date:   09/12/2023    Expiration Date:   09/11/2024   CBC with Differential    Standing Status:   Future    Expected Date:   09/12/2023    Expiration Date:   09/11/2024   Comprehensive metabolic panel    Standing Status:   Future    Expected Date:   09/12/2023    Expiration Date:   09/11/2024   Urinalysis, dipstick only    Standing Status:   Future    Expected Date:   09/12/2023    Expiration Date:   09/11/2024   CEA    Standing Status:   Future    Expected Date:   10/10/2023    Expiration Date:   10/09/2024   Magnesium    Standing Status:   Future    Expected Date:   10/10/2023    Expiration Date:   10/09/2024   CBC with Differential    Standing Status:   Future    Expected Date:   10/10/2023    Expiration Date:   10/09/2024   Comprehensive metabolic panel    Standing Status:   Future    Expected Date:   10/10/2023    Expiration Date:   10/09/2024   Urinalysis, dipstick only    Standing Status:   Future    Expected Date:   10/10/2023    Expiration Date:   10/09/2024   CEA    Standing Status:   Future    Expected Date:   11/07/2023    Expiration Date:   11/06/2024   Magnesium    Standing Status:   Future    Expected Date:   11/07/2023    Expiration Date:   11/06/2024   CBC with Differential    Standing Status:   Future    Expected Date:   11/07/2023    Expiration Date:   11/06/2024   Comprehensive metabolic panel    Standing Status:   Future    Expected Date:   11/07/2023    Expiration Date:   11/06/2024   Urinalysis, dipstick only    Standing Status:   Future    Expected Date:   11/07/2023    Expiration Date:   11/06/2024      Mikeal Hawthorne R Teague,acting as a scribe for [name], MD.,have documented all relevant documentation on the behalf of [name], MD,as directed by  [name], MD while in the presence of  [name], MD.  I, [name] MD, have reviewed the above documentation for accuracy and completeness, and I agree with the above.    [name], MD   3/26/20259:28 AM  CHIEF COMPLAINT:   Diagnosis: metastatic rectal cancer    Cancer Staging  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HCC)  Staging form: Colon and Rectum, AJCC 8th Edition - Clinical stage from 03/13/2018: Stage IVA (cT3, cN1b, cM1a) - Signed by [name], MD on 03/13/2018    Prior Therapy: 1. FOLFOX & vectibix x 7 cycles from 03/14/2018 to 06/27/2018. 2. XRT with Xeloda from 07/30/2018 to 09/06/2018. 3. Right liver lesion microwave ablation on 10/15/2018. 4. Low anterior resection and diverting ileostomy on 12/07/2018. 5. Right hepatic lobe microwave ablation on 08/15/19 6. SBRT to LLL and liver 11/15/2019 -12/13/2019. 7. SBRT to RUL 08/06/20 - 08/13/20 8. IMRT to 3 left lung lesions and subcarinal LN 09/21/21 - 10/05/21 9. XRT to C6 07/14/22 - 08/03/22 10. SRS to brain metastasis on 08/31/22 11. FOLFOX and bevacizumab, 8 cycles, 07/06/22 - 11/09/22  Current Therapy:  maintenance 5-FU and bevacizumab   HISTORY OF PRESENT ILLNESS:   Oncology History  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HCC)  02/26/2018 Initial Diagnosis   Rectal cancer (HCC)   03/13/2018 Cancer Staging   Staging form: Colon and Rectum, AJCC 8th Edition - Clinical stage from 03/13/2018: Stage IVA (cT3, cN1b, cM1a) - Signed by [name], MD on 03/13/2018   03/14/2018 - 06/29/2018 Chemotherapy   The patient had palonosetron (ALOXI) injection 0.25 mg, 0.25 mg, Intravenous,  Once, 7 of 8 cycles Administration: 0.25 mg (03/14/2018), 0.25 mg (03/27/2018), 0.25 mg (04/18/2018), 0.25 mg (05/02/2018), 0.25 mg (05/16/2018), 0.25 mg (06/05/2018), 0.25 mg (06/27/2018) leucovorin 800 mg in dextrose 5 % 250 mL infusion, 772 mg, Intravenous,  Once, 7 of 8 cycles Administration: 800 mg (03/14/2018), 800 mg (03/27/2018), 800 mg (05/02/2018), 800  mg (05/16/2018), 700 mg (04/18/2018), 800 mg (06/05/2018), 800 mg (06/27/2018) oxaliplatin (ELOXATIN) 165 mg in dextrose 5 % 500 mL chemo infusion, 85 mg/m2 = 165 mg, Intravenous,  Once, 6 of 7 cycles Dose modification: 68 mg/m2 (80 % of original dose 85 mg/m2, Cycle 4, Reason: Provider Judgment) Administration: 165 mg (03/14/2018), 165 mg (03/27/2018), 130 mg (05/02/2018), 130 mg (05/16/2018), 130 mg (06/05/2018), 130 mg (06/27/2018) panitumumab (VECTIBIX) 500 mg in sodium chloride 0.9 % 100 mL chemo infusion, 480 mg, Intravenous,  Once, 4 of 5 cycles Administration: 500 mg (04/18/2018), 480 mg (05/02/2018), 480 mg (05/16/2018), 480 mg (06/05/2018) fluorouracil (ADRUCIL) chemo injection 750 mg, 400 mg/m2 = 750 mg (100 % of original dose 400 mg/m2), Intravenous,  Once, 6 of 7 cycles Dose modification: 400 mg/m2 (original dose 400 mg/m2, Cycle 1), 400 mg/m2 (original dose 400 mg/m2, Cycle 3) Administration: 750 mg (03/14/2018), 750 mg (04/18/2018), 750 mg (05/02/2018), 750 mg (05/16/2018), 750 mg (06/05/2018), 750 mg (06/27/2018) fosaprepitant (EMEND) 150 mg, dexamethasone (DECADRON) 12 mg in sodium chloride 0.9 % 145 mL IVPB, , Intravenous,  Once, 4 of 5 cycles Administration:  (05/02/2018),  (05/16/2018),  (06/05/2018),  (06/27/2018) fluorouracil (ADRUCIL) 4,650 mg in sodium chloride 0.9 % 57 mL chemo infusion, 2,400 mg/m2 = 4,650 mg, Intravenous, 1 Day/Dose, 7 of 8 cycles Administration: 4,650 mg (03/14/2018), 4,650 mg (03/27/2018), 4,650 mg (04/18/2018), 4,650 mg (05/02/2018), 4,650 mg (05/16/2018), 4,650 mg (06/05/2018), 4,650 mg (06/27/2018)  for chemotherapy treatment.    07/06/2022 -  Chemotherapy   Patient is on Treatment Plan : COLORECTAL FOLFOX + Bevacizumab q14d        INTERVAL HISTORY:   Samantha Reynolds is a 58 y.o. female presenting to clinic today for follow up of metastatic rectal cancer. She was last seen by me on 04/26/23.  Today, she states that she is doing well overall. Her appetite level is at 80%. Her energy level  is at 75%.  PAST MEDICAL HISTORY:   Past Medical History: Past Medical History:  Diagnosis Date   Anxiety    Cancer (HCC) 2013   thyroid   Endometrial polyp    Endometrial thickening on ultra sound    GERD (gastroesophageal reflux disease)    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   Worse in her 2s when she was ill   H/O Hashimoto thyroiditis    History of chemotherapy    History of radiation therapy    History of thyroid cancer no recurrence   2013--  s/p  left lobe thyroidectomy--  follicular varient papillary / lymphocyctic thyroiditis   Hyperlipidemia 11/09/2015   Hypothyroidism    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HCC) 02/26/2018    Surgical History: Past Surgical History:  Procedure Laterality Date   BIOPSY  02/19/2018   Procedure: BIOPSY;  Surgeon: Malissa Hippo, MD;  [facility];  Service: Endoscopy;;  rectum   COLONOSCOPY N/A 08/20/2015   Procedure: COLONOSCOPY;  Surgeon: Malissa Hippo, MD;  [facility]: [facility];  Service: Endoscopy;  Laterality: N/A;  730   COLONOSCOPY WITH PROPOFOL N/A 07/21/2021   Procedure: COLONOSCOPY WITH PROPOFOL;  Surgeon: Malissa Hippo, MD;  Location: [facility];  Service: Endoscopy;  Laterality: N/A;  10:10   D & C HYSTEROOSCOPY W/ THERMACHOICE ENDOMETRIAL ABLATION  08-13-2004   DIVERTING ILEOSTOMY N/A 12/07/2018   Procedure: DIVERTING LOOP ILEOSTOMY;  Surgeon: Romie Levee, MD;  Location: [facility];  Service: General;  Laterality: N/A;   FLEXIBLE SIGMOIDOSCOPY N/A 02/19/2018   Procedure: FLEXIBLE SIGMOIDOSCOPY;  Surgeon: Malissa Hippo, MD;  Location: [facility];  Service: Endoscopy;  Laterality: N/A;   HYSTEROSCOPY WITH D & C N/A 04/30/2015   Procedure: DILATATION AND CURETTAGE / INTENDED HYSTEROSCOPY;  Surgeon: Marcelle Overlie, MD;  Location: [facility];  Service: Gynecology;  Laterality: N/A;   ILEOSTOMY CLOSURE N/A 04/17/2019   Procedure: LOOP ILEOSTOMY REVERSAL;  Surgeon: Romie Levee, MD;  Location: [facility];   Service: General;  Laterality: N/A;   IR US GUIDE BX ASP/DRAIN  03/09/2018   LAPAROSCOPIC CHOLECYSTECTOMY  04/1996   LAPAROSCOPY N/A 12/11/2018   Procedure: LAPAROSCOPY  ILEOSTOMY REVISION AND ABDOMINAL WASHOUT;  Surgeon: Romie Levee, MD;  Location: [facility];  Service: General;  Laterality: N/A;   Liver Microwave   10/17/2018   POLYPECTOMY  08/20/2015   Procedure: POLYPECTOMY;  Surgeon: Malissa Hippo, MD;  [facility];  Service: Endoscopy;;  Splenic flexure polypectomy   POLYPECTOMY  02/19/2018   Procedure: POLYPECTOMY;  Surgeon: Malissa Hippo, MD;  [facility];  Service: Endoscopy;;  rectum   PORTACATH PLACEMENT N/A 03/14/2018   Procedure: INSERTION PORT-A-CATH;  Surgeon: Franky Macho, MD;  Location: [facility];  Service: General;  Laterality: N/A;   REDUCTION INCARCERATED UTERUS  06-20-2000   intrauterine preg. 13 wks /  urinary retention   THYROID LOBECTOMY  11/24/2011   Procedure: THYROID LOBECTOMY;  Surgeon: Velora Heckler, MD;  Location: [facility];  Service: General;  Laterality: Left;  Left Thyroid Lobectomy   TUBAL LIGATION  2002   XI ROBOTIC ASSISTED LOWER ANTERIOR RESECTION N/A 12/07/2018   Procedure: XI ROBOTIC ASSISTED LOWER ANTERIOR RESECTION, SPENIC FLEXURE IMMOBILIZATION, COLOANAL ANASTOMOSIS, RIGID PROCTOSCOPY;  Surgeon: Romie Levee, MD;  Location: [facility];  Service: General;  Laterality: N/A;    Social History: Social History   Socioeconomic History   Marital status: Married    Spouse name: Not on file   Number of  children: 4   Years of education: Not on file   Highest education level: Not on file  Occupational History    Comment: Scheduler at [employer]  Tobacco Use   Smoking status: Former    Current packs/day: 0.00    Average packs/day: 0.3 packs/day for 10.0 years (2.5 ttl pk-yrs)    Types: Cigarettes    Start date: 10/30/1981    Quit date: 10/31/1991    Years since quitting: 31.6   Smokeless tobacco: Never  Vaping Use   Vaping status:  Never Used  Substance and Sexual Activity   Alcohol use: No   Drug use: No   Sexual activity: Not Currently  Other Topics Concern   Not on file  Social History Narrative   Lives with husband Loraine Leriche and 13 year old son Almeta Monas   Husband is Technical brewer of [employer]   Social Drivers of Health   Financial Resource Strain: Low Risk  (02/26/2018)   Overall Financial Resource Strain (CARDIA)    Difficulty of Paying Living Expenses: Not hard at all  Food Insecurity: No Food Insecurity (12/05/2022)   Hunger Vital Sign    Worried About Running Out of Food in the Last Year: Never true    Ran Out of Food in the Last Year: Never true  Transportation Needs: No Transportation Needs (12/05/2022)   PRAPARE - Administrator, Civil Service (Medical): No    Lack of Transportation (Non-Medical): No  Physical Activity: Inactive (02/26/2018)   Exercise Vital Sign    Days of Exercise per Week: 0 days    Minutes of Exercise per Session: 0 min  Stress: Stress Concern Present (02/26/2018)   [employer] of Occupational Health - Occupational Stress Questionnaire    Feeling of Stress : To some extent  Social Connections: Socially Integrated (02/26/2018)   Social Connection and Isolation Panel [NHANES]    Frequency of Communication with Friends and Family: More than three times a week    Frequency of Social Gatherings with Friends and Family: Twice a week    Attends Religious Services: More than 4 times per year    Active Member of [employer] or Organizations: Yes    Attends Engineer, structural: More than 4 times per year    Marital Status: Married  Catering manager Violence: Not At Risk (12/05/2022)   Humiliation, Afraid, Rape, and Kick questionnaire    Fear of Current or Ex-Partner: No    Emotionally Abused: No    Physically Abused: No    Sexually Abused: No    Family History: Family History  Problem Relation Age of Onset   Hypertension Mother    Hypertension Father    Prostate  cancer Father    Cancer Maternal Aunt        spine/back   Cancer Paternal Grandfather        lung   Healthy Son    Healthy Son    Healthy Son    Healthy Son    Colon cancer Neg Hx     Current Medications:  Current Outpatient Medications:    ALPRAZolam (XANAX) 0.5 MG tablet, Take 1 tablet (0.5 mg total) by mouth 3 (three) times daily as needed for anxiety., Disp: 90 tablet, Rfl: 3   amitriptyline (ELAVIL) 10 MG tablet, Take 1 tablet (10 mg total) by mouth 2 (two) times daily., Disp: 180 tablet, Rfl: 3   ARMOUR THYROID 60 MG tablet, Take 1 tablet (60 mg total) by mouth daily.,  Disp: 90 tablet, Rfl: 1   loperamide (IMODIUM) 2 MG capsule, Take 1 capsule (2 mg total) by mouth 3 (three) times daily. (Patient taking differently: Take 2 mg by mouth daily.), Disp: 30 capsule, Rfl: 0   Potassium Chloride 10 % SOLN, Take 20 mEq by mouth daily., Disp: 900 mL, Rfl: 2   prochlorperazine (COMPAZINE) 10 MG tablet, Take 1 tablet (10 mg total) by mouth every 6 (six) hours as needed for nausea or vomiting., Disp: 30 tablet, Rfl: 4 No current facility-administered medications for this visit.  Facility-Administered Medications Ordered in Other Visits:    clindamycin (CLEOCIN) 900 mg in dextrose 5 % 50 mL IVPB, 900 mg, Intravenous, 60 min Pre-Op **AND** gentamicin (GARAMYCIN) 350 mg in dextrose 5 % 50 mL IVPB, 5 mg/kg, Intravenous, 60 min Pre-Op, Romie Levee, MD   clindamycin (CLEOCIN) 900 mg in dextrose 5 % 50 mL IVPB, 900 mg, Intravenous, 60 min Pre-Op **AND** gentamicin (GARAMYCIN) 5 mg/kg in dextrose 5 % 50 mL IVPB, 5 mg/kg, Intravenous, 60 min Pre-Op, Romie Levee, MD   sodium chloride 0.9 % 1,000 mL with potassium chloride 20 mEq, magnesium sulfate 2 g infusion, , Intravenous, Once, [name], MD   Allergies: Allergies  Allergen Reactions   Oxaliplatin Other (See Comments)    Patient had hypersensitivity reaction to Oxaliplatin. Patient c/o itching in her ears and eyes. Pt also c/o  being hot, flushed, dizzy with redness on pt's face, arms, and legs. Pt c/o tingling in her hands and legs. Pt also had a dry throat with coughing. See progress note from 09/07/22 at 1130. Patient was not able to complete Oxaliplatin infusion.   Second reaction 10/26/2022. Itching in ears, heart racing, & face flushing. See progress note from 10/26/2022. Able to complete infusion after medications.   Demerol [Meperidine] Itching    All over the body.   Penicillins Hives     childhood, does not remember if it spread all over the body or not. Did it involve swelling of the face/tongue/throat, SOB, or low BP? No Did it involve sudden or severe rash/hives, skin peeling, or any reaction on the inside of your mouth or nose? Yes Did you need to seek medical attention at a hospital or doctor's office? Yes When did it last happen?      childhood allergy If all above answers are "NO", may proceed with cephalosporin use.    Levaquin [Levofloxacin] Other (See Comments)    Patient has Aortic Aneurysm and is not indicated with this diagnosis   Other Hives and Other (See Comments)    Fresh coconut    Vancomycin Rash    Will need Benadryl prior to administration per IV. "Red man syndrome"    REVIEW OF SYSTEMS:   Review of Systems  Constitutional:  Negative for chills, fatigue and fever.  HENT:   Negative for lump/mass, mouth sores, nosebleeds, sore throat and trouble swallowing.   Eyes:  Negative for eye problems.  Respiratory:  Positive for cough. Negative for shortness of breath.   Cardiovascular:  Negative for chest pain, leg swelling and palpitations.  Gastrointestinal:  Positive for constipation and diarrhea. Negative for abdominal pain, nausea and vomiting.  Genitourinary:  Negative for bladder incontinence, difficulty urinating, dysuria, frequency, hematuria and nocturia.   Musculoskeletal:  Negative for arthralgias, back pain, flank pain, myalgias and neck pain.  Skin:  Negative for itching and  rash.  Neurological:  Positive for numbness. Negative for dizziness and headaches.  Hematological:  Does not bruise/bleed easily.  Psychiatric/Behavioral:  Negative for depression, sleep disturbance and suicidal ideas. The patient is not nervous/anxious.   All other systems reviewed and are negative.    VITALS:   Blood pressure 134/79, pulse 97, temperature 97.6 F (36.4 C), temperature source Tympanic, resp. rate 19, weight 135 lb (61.2 kg), last menstrual period 02/16/2015, SpO2 100%.  Wt Readings from Last 3 Encounters:  06/21/23 135 lb (61.2 kg)  05/24/23 133 lb 9.6 oz (60.6 kg)  04/26/23 132 lb 15 oz (60.3 kg)    Body mass index is 21.46 kg/m.  Performance status (ECOG): 1 - Symptomatic but completely ambulatory  PHYSICAL EXAM:   Physical Exam Vitals and nursing note reviewed. Exam conducted with a chaperone present.  Constitutional:      Appearance: Normal appearance.  Cardiovascular:     Rate and Rhythm: Normal rate and regular rhythm.     Pulses: Normal pulses.     Heart sounds: Normal heart sounds.  Pulmonary:     Effort: Pulmonary effort is normal.     Breath sounds: Normal breath sounds.  Abdominal:     Palpations: Abdomen is soft. There is no hepatomegaly, splenomegaly or mass.     Tenderness: There is no abdominal tenderness.  Musculoskeletal:     Right lower leg: No edema.     Left lower leg: No edema.  Lymphadenopathy:     Cervical: No cervical adenopathy.     Right cervical: No superficial, deep or posterior cervical adenopathy.    Left cervical: No superficial, deep or posterior cervical adenopathy.     Upper Body:     Right upper body: No supraclavicular or axillary adenopathy.     Left upper body: No supraclavicular or axillary adenopathy.  Neurological:     General: No focal deficit present.     Mental Status: She is alert and oriented to person, place, and time.  Psychiatric:        Mood and Affect: Mood normal.        Behavior: Behavior  normal.     LABS:   CBC     Component Value Date/Time   WBC 1.9 (L) 06/21/2023 0826   RBC 3.21 (L) 06/21/2023 0826   HGB 10.9 (L) 06/21/2023 0826   HGB 13.5 11/03/2015 0841   HCT 33.1 (L) 06/21/2023 0826   HCT 39.7 11/03/2015 0841   PLT 48 (L) 06/21/2023 0826   PLT 165 11/03/2015 0841   MCV 103.1 (H) 06/21/2023 0826   MCV 95 11/03/2015 0841   MCH 34.0 06/21/2023 0826   MCHC 32.9 06/21/2023 0826   RDW 14.8 06/21/2023 0826   RDW 13.2 11/03/2015 0841   LYMPHSABS 0.4 (L) 06/21/2023 0826   LYMPHSABS 1.4 11/03/2015 0841   MONOABS 0.1 06/21/2023 0826   EOSABS 0.0 06/21/2023 0826   EOSABS 0.1 11/03/2015 0841   BASOSABS 0.0 06/21/2023 0826   BASOSABS 0.0 11/03/2015 0841    CMP      Component Value Date/Time   NA 138 06/21/2023 0826   NA 140 11/03/2015 0841   K 3.2 (L) 06/21/2023 0826   CL 105 06/21/2023 0826   CO2 24 06/21/2023 0826   GLUCOSE 96 06/21/2023 0826   BUN 13 06/21/2023 0826   BUN 10 11/03/2015 0841   CREATININE 0.83 06/21/2023 0826   CALCIUM 8.6 (L) 06/21/2023 0826   PROT 6.3 (L) 06/21/2023 0826   PROT 6.9 11/03/2015 0841   ALBUMIN 3.5 06/21/2023 0826   ALBUMIN 4.5 11/03/2015 0841   AST 26 06/21/2023 0826  ALT 14 06/21/2023 0826   ALKPHOS 114 06/21/2023 0826   BILITOT 1.9 (H) 06/21/2023 0826   BILITOT 2.1 (H) 11/03/2015 0841   GFRNONAA >60 06/21/2023 0826   GFRAA >60 11/21/2019 1348     Lab Results  Component Value Date   CEA1 3.2 05/24/2023   /  CEA  Date Value Ref Range Status  05/24/2023 3.2 0.0 - 4.7 ng/mL Final    Comment:    (NOTE)                             Nonsmokers          <3.9                             Smokers             <5.6 Roche [employer] Electrochemiluminescence Immunoassay (ECLIA) Values obtained with different assay methods or kits cannot be used interchangeably.  Results cannot be interpreted as absolute evidence of the presence or absence of malignant disease. Performed At: [facility] [address] [place], [place] [id number] Jolene Schimke MD [id number]    No results found for: "PSA1" No results found for: "CAN199" No results found for: "CAN125"  No results found for: "TOTALPROTELP", "ALBUMINELP", "A1GS", "A2GS", "BETS", "BETA2SER", "GAMS", "MSPIKE", "SPEI" Lab Results  Component Value Date   TIBC 333 09/07/2022   TIBC 364 05/11/2022   TIBC 385 11/09/2020   FERRITIN 89 09/07/2022   FERRITIN 61 05/11/2022   FERRITIN 54 11/09/2020   IRONPCTSAT 36 (H) 09/07/2022   IRONPCTSAT 26 05/11/2022   IRONPCTSAT 22 11/09/2020   Lab Results  Component Value Date   LDH 154 01/20/2020     STUDIES:   No results found.

## 2023-06-21 NOTE — Progress Notes (Signed)
 Patient has been examined by Dr. Ellin Saba. Vital signs and labs have been reviewed by MD - ANC (1.3), Creatinine, LFTs (total bilirubin 1.9), hemoglobin, and platelets (48) are within treatment parameters per M.D. - pt may proceed with treatment. Primary RN and pharmacy notified.

## 2023-06-21 NOTE — Patient Instructions (Signed)
 [facility] [place] - A DEPT OF [facility]  Discharge Instructions: Thank you for choosing [facility] to provide your oncology and hematology care.  If you have a lab appointment with the Cancer Center - please note that after April 8th, 2024, all labs will be drawn in the cancer center.  You do not have to check in or register with the main entrance as you have in the past but will complete your check-in in the cancer center.  Wear comfortable clothing and clothing appropriate for easy access to any Portacath or PICC line.   We strive to give you quality time with your provider. You may need to reschedule your appointment if you arrive late (15 or more minutes).  Arriving late affects you and other patients whose appointments are after yours.  Also, if you miss three or more appointments without notifying the office, you may be dismissed from the clinic at the provider's discretion.      For prescription refill requests, have your pharmacy contact our office and allow 72 hours for refills to be completed.    Today you received the following chemotherapy and/or immunotherapy agents MVASI/Leucovorin/5FU.  Bevacizumab Injection What is this medication? BEVACIZUMAB (be va SIZ yoo mab) treats some types of cancer. It works by blocking a protein that causes cancer cells to grow and multiply. This helps to slow or stop the spread of cancer cells. It is a monoclonal antibody. This medicine may be used for other purposes; ask your health care provider or pharmacist if you have questions. COMMON BRAND NAME(S): Alymsys, Avastin, MVASI, [place] Lao What should I tell my care team before I take this medication? They need to know if you have any of these conditions: Blood clots Coughing up blood Having or recent surgery Heart failure High blood pressure History of a connection between 2 or more body parts that do not usually connect (fistula) History of a  tear in your stomach or intestines Protein in your urine An unusual or allergic reaction to bevacizumab, other medications, foods, dyes, or preservatives Pregnant or trying to get pregnant Breast-feeding How should I use this medication? This medication is injected into a vein. It is given by your care team in a hospital or clinic setting. Talk to your care team the use of this medication in children. Special care may be needed. Overdosage: If you think you have taken too much of this medicine contact a poison control center or emergency room at once. NOTE: This medicine is only for you. Do not share this medicine with others. What if I miss a dose? Keep appointments for follow-up doses. It is important not to miss your dose. Call your care team if you are unable to keep an appointment. What may interact with this medication? Interactions are not expected. This list may not describe all possible interactions. Give your health care provider a list of all the medicines, herbs, non-prescription drugs, or dietary supplements you use. Also tell them if you smoke, drink alcohol, or use illegal drugs. Some items may interact with your medicine. What should I watch for while using this medication? Your condition will be monitored carefully while you are receiving this medication. You may need blood work while taking this medication. This medication may make you feel generally unwell. This is not uncommon as chemotherapy can affect healthy cells as well as cancer cells. Report any side effects. Continue your course of treatment even though you feel  ill unless your care team tells you to stop. This medication may increase your risk to bruise or bleed. Call your care team if you notice any unusual bleeding. Before having surgery, talk to your care team to make sure it is ok. This medication can increase the risk of poor healing of your surgical site or wound. You will need to stop this medication for 28 days  before surgery. After surgery, wait at least 28 days before restarting this medication. Make sure the surgical site or wound is healed enough before restarting this medication. Talk to your care team if questions. Talk to your care team if you may be pregnant. Serious birth defects can occur if you take this medication during pregnancy and for 6 months after the last dose. Contraception is recommended while taking this medication and for 6 months after the last dose. Your care team can help you find the option that works for you. Do not breastfeed while taking this medication and for 6 months after the last dose. This medication can cause infertility. Talk to your care team if you are concerned about your fertility. What side effects may I notice from receiving this medication? Side effects that you should report to your care team as soon as possible: Allergic reactions--skin rash, itching, hives, swelling of the face, lips, tongue, or throat Bleeding--bloody or black, tar-like stools, vomiting blood or [place] material that looks like coffee grounds, red or dark [place] urine, small red or purple spots on skin, unusual bruising or bleeding Blood clot--pain, swelling, or warmth in the leg, shortness of breath, chest pain Heart attack--pain or tightness in the chest, shoulders, arms, or jaw, nausea, shortness of breath, cold or clammy skin, feeling faint or lightheaded Heart failure--shortness of breath, swelling of the ankles, feet, or hands, sudden weight gain, unusual weakness or fatigue Increase in blood pressure Infection--fever, chills, cough, sore throat, wounds that don't heal, pain or trouble when passing urine, general feeling of discomfort or being unwell Infusion reactions--chest pain, shortness of breath or trouble breathing, feeling faint or lightheaded Kidney injury--decrease in the amount of urine, swelling of the ankles, hands, or feet Stomach pain that is severe, does not go away, or gets  worse Stroke--sudden numbness or weakness of the face, arm, or leg, trouble speaking, confusion, trouble walking, loss of balance or coordination, dizziness, severe headache, change in vision Sudden and severe headache, confusion, change in vision, seizures, which may be signs of posterior reversible encephalopathy syndrome (PRES) Side effects that usually do not require medical attention (report to your care team if they continue or are bothersome): Back pain Change in taste Diarrhea Dry skin Increased tears Nosebleed This list may not describe all possible side effects. Call your doctor for medical advice about side effects. You may report side effects to FDA at 1-800-FDA-1088. Where should I keep my medication? This medication is given in a hospital or clinic. It will not be stored at home. NOTE: This sheet is a summary. It may not cover all possible information. If you have questions about this medicine, talk to your doctor, pharmacist, or health care provider.  2024 Elsevier/Gold Standard (2021-07-30 00:00:00)   Leucovorin Injection What is this medication? LEUCOVORIN (loo koe VOR in) prevents side effects from certain medications, such as methotrexate. It works by increasing folate levels. This helps protect healthy cells in your body. It may also be used to treat anemia caused by low levels of folate. It can also be used with fluorouracil, a type  of chemotherapy, to treat colorectal cancer. It works by increasing the effects of fluorouracil in the body. This medicine may be used for other purposes; ask your health care provider or pharmacist if you have questions. What should I tell my care team before I take this medication? They need to know if you have any of these conditions: Anemia from low levels of vitamin B12 in the blood An unusual or allergic reaction to leucovorin, folic acid, other medications, foods, dyes, or preservatives Pregnant or trying to get  pregnant Breastfeeding How should I use this medication? This medication is injected into a vein or a muscle. It is given by your care team in a hospital or clinic setting. Talk to your care team about the use of this medication in children. Special care may be needed. Overdosage: If you think you have taken too much of this medicine contact a poison control center or emergency room at once. NOTE: This medicine is only for you. Do not share this medicine with others. What if I miss a dose? Keep appointments for follow-up doses. It is important not to miss your dose. Call your care team if you are unable to keep an appointment. What may interact with this medication? Capecitabine Fluorouracil Phenobarbital Phenytoin Primidone Trimethoprim;sulfamethoxazole This list may not describe all possible interactions. Give your health care provider a list of all the medicines, herbs, non-prescription drugs, or dietary supplements you use. Also tell them if you smoke, drink alcohol, or use illegal drugs. Some items may interact with your medicine. What should I watch for while using this medication? Your condition will be monitored carefully while you are receiving this medication. This medication may increase the side effects of 5-fluorouracil. Tell your care team if you have diarrhea or mouth sores that do not get better or that get worse. What side effects may I notice from receiving this medication? Side effects that you should report to your care team as soon as possible: Allergic reactions--skin rash, itching, hives, swelling of the face, lips, tongue, or throat This list may not describe all possible side effects. Call your doctor for medical advice about side effects. You may report side effects to FDA at 1-800-FDA-1088. Where should I keep my medication? This medication is given in a hospital or clinic. It will not be stored at home. NOTE: This sheet is a summary. It may not cover all possible  information. If you have questions about this medicine, talk to your doctor, pharmacist, or health care provider.  2024 Elsevier/Gold Standard (2021-08-17 00:00:00)   Fluorouracil Injection What is this medication? FLUOROURACIL (flure oh YOOR a sil) treats some types of cancer. It works by slowing down the growth of cancer cells. This medicine may be used for other purposes; ask your health care provider or pharmacist if you have questions. COMMON BRAND NAME(S): Adrucil What should I tell my care team before I take this medication? They need to know if you have any of these conditions: Blood disorders Dihydropyrimidine dehydrogenase (DPD) deficiency Infection, such as chickenpox, cold sores, herpes Kidney disease Liver disease Poor nutrition Recent or ongoing radiation therapy An unusual or allergic reaction to fluorouracil, other medications, foods, dyes, or preservatives If you or your partner are pregnant or trying to get pregnant Breast-feeding How should I use this medication? This medication is injected into a vein. It is administered by your care team in a hospital or clinic setting. Talk to your care team about the use of this medication in children.  Special care may be needed. Overdosage: If you think you have taken too much of this medicine contact a poison control center or emergency room at once. NOTE: This medicine is only for you. Do not share this medicine with others. What if I miss a dose? Keep appointments for follow-up doses. It is important not to miss your dose. Call your care team if you are unable to keep an appointment. What may interact with this medication? Do not take this medication with any of the following: Live virus vaccines This medication may also interact with the following: Medications that treat or prevent blood clots, such as warfarin, enoxaparin, dalteparin This list may not describe all possible interactions. Give your health care provider a  list of all the medicines, herbs, non-prescription drugs, or dietary supplements you use. Also tell them if you smoke, drink alcohol, or use illegal drugs. Some items may interact with your medicine. What should I watch for while using this medication? Your condition will be monitored carefully while you are receiving this medication. This medication may make you feel generally unwell. This is not uncommon as chemotherapy can affect healthy cells as well as cancer cells. Report any side effects. Continue your course of treatment even though you feel ill unless your care team tells you to stop. In some cases, you may be given additional medications to help with side effects. Follow all directions for their use. This medication may increase your risk of getting an infection. Call your care team for advice if you get a fever, chills, sore throat, or other symptoms of a cold or flu. Do not treat yourself. Try to avoid being around people who are sick. This medication may increase your risk to bruise or bleed. Call your care team if you notice any unusual bleeding. Be careful brushing or flossing your teeth or using a toothpick because you may get an infection or bleed more easily. If you have any dental work done, tell your dentist you are receiving this medication. Avoid taking medications that contain aspirin, acetaminophen, ibuprofen, naproxen, or ketoprofen unless instructed by your care team. These medications may hide a fever. Do not treat diarrhea with over the counter products. Contact your care team if you have diarrhea that lasts more than 2 days or if it is severe and watery. This medication can make you more sensitive to the sun. Keep out of the sun. If you cannot avoid being in the sun, wear protective clothing and sunscreen. Do not use sun lamps, tanning beds, or tanning booths. Talk to your care team if you or your partner wish to become pregnant or think you might be pregnant. This medication  can cause serious birth defects if taken during pregnancy and for 3 months after the last dose. A reliable form of contraception is recommended while taking this medication and for 3 months after the last dose. Talk to your care team about effective forms of contraception. Do not father a child while taking this medication and for 3 months after the last dose. Use a condom while having sex during this time period. Do not breastfeed while taking this medication. This medication may cause infertility. Talk to your care team if you are concerned about your fertility. What side effects may I notice from receiving this medication? Side effects that you should report to your care team as soon as possible: Allergic reactions--skin rash, itching, hives, swelling of the face, lips, tongue, or throat Heart attack--pain or tightness in the chest,  shoulders, arms, or jaw, nausea, shortness of breath, cold or clammy skin, feeling faint or lightheaded Heart failure--shortness of breath, swelling of the ankles, feet, or hands, sudden weight gain, unusual weakness or fatigue Heart rhythm changes--fast or irregular heartbeat, dizziness, feeling faint or lightheaded, chest pain, trouble breathing High ammonia level--unusual weakness or fatigue, confusion, loss of appetite, nausea, vomiting, seizures Infection--fever, chills, cough, sore throat, wounds that don't heal, pain or trouble when passing urine, general feeling of discomfort or being unwell Low red blood cell level--unusual weakness or fatigue, dizziness, headache, trouble breathing Pain, tingling, or numbness in the hands or feet, muscle weakness, change in vision, confusion or trouble speaking, loss of balance or coordination, trouble walking, seizures Redness, swelling, and blistering of the skin over hands and feet Severe or prolonged diarrhea Unusual bruising or bleeding Side effects that usually do not require medical attention (report to your care team  if they continue or are bothersome): Dry skin Headache Increased tears Nausea Pain, redness, or swelling with sores inside the mouth or throat Sensitivity to light Vomiting This list may not describe all possible side effects. Call your doctor for medical advice about side effects. You may report side effects to FDA at 1-800-FDA-1088. Where should I keep my medication? This medication is given in a hospital or clinic. It will not be stored at home. NOTE: This sheet is a summary. It may not cover all possible information. If you have questions about this medicine, talk to your doctor, pharmacist, or health care provider.  2024 Elsevier/Gold Standard (2021-07-20 00:00:00)       To help prevent nausea and vomiting after your treatment, we encourage you to take your nausea medication as directed.  BELOW ARE SYMPTOMS THAT SHOULD BE REPORTED IMMEDIATELY: *FEVER GREATER THAN 100.4 F (38 C) OR HIGHER *CHILLS OR SWEATING *NAUSEA AND VOMITING THAT IS NOT CONTROLLED WITH YOUR NAUSEA MEDICATION *UNUSUAL SHORTNESS OF BREATH *UNUSUAL BRUISING OR BLEEDING *URINARY PROBLEMS (pain or burning when urinating, or frequent urination) *BOWEL PROBLEMS (unusual diarrhea, constipation, pain near the anus) TENDERNESS IN MOUTH AND THROAT WITH OR WITHOUT PRESENCE OF ULCERS (sore throat, sores in mouth, or a toothache) UNUSUAL RASH, SWELLING OR PAIN  UNUSUAL VAGINAL DISCHARGE OR ITCHING   Items with * indicate a potential emergency and should be followed up as soon as possible or go to the Emergency Department if any problems should occur.  Please show the CHEMOTHERAPY ALERT CARD or IMMUNOTHERAPY ALERT CARD at check-in to the Emergency Department and triage nurse.  Should you have questions after your visit or need to cancel or reschedule your appointment, please contact [facility] [place] - A DEPT OF [facility] [phone]  and follow the prompts.  Office hours are 8:00 a.m. to  4:30 p.m. Monday - Friday. Please note that voicemails left after 4:00 p.m. may not be returned until the following business day.  We are closed weekends and major holidays. You have access to a nurse at all times for urgent questions. Please call the main number to the clinic [phone] and follow the prompts.  For any non-urgent questions, you may also contact your provider using MyChart. We now offer e-Visits for anyone 19 and older to request care online for non-urgent symptoms. For details visit [email or web address].   Also download the MyChart app! Go to the app store, search "MyChart", open the app, select [place], and log in with your MyChart username and password.

## 2023-06-22 ENCOUNTER — Other Ambulatory Visit (HOSPITAL_COMMUNITY): Payer: Self-pay

## 2023-06-22 ENCOUNTER — Other Ambulatory Visit: Payer: Self-pay

## 2023-06-22 LAB — CEA: CEA: 2.9 ng/mL (ref 0.0–4.7)

## 2023-06-23 ENCOUNTER — Other Ambulatory Visit (HOSPITAL_COMMUNITY): Payer: Self-pay

## 2023-06-23 ENCOUNTER — Other Ambulatory Visit: Payer: Self-pay

## 2023-06-23 ENCOUNTER — Encounter (HOSPITAL_COMMUNITY): Payer: Self-pay | Admitting: Hematology

## 2023-06-23 ENCOUNTER — Encounter: Payer: Self-pay | Admitting: Family Medicine

## 2023-06-23 ENCOUNTER — Encounter: Payer: Self-pay | Admitting: Hematology

## 2023-06-23 ENCOUNTER — Inpatient Hospital Stay: Payer: 59

## 2023-06-23 VITALS — BP 122/85 | HR 80 | Temp 98.7°F | Resp 16

## 2023-06-23 DIAGNOSIS — C7931 Secondary malignant neoplasm of brain: Secondary | ICD-10-CM | POA: Diagnosis not present

## 2023-06-23 DIAGNOSIS — E063 Autoimmune thyroiditis: Secondary | ICD-10-CM

## 2023-06-23 DIAGNOSIS — Z5112 Encounter for antineoplastic immunotherapy: Secondary | ICD-10-CM | POA: Diagnosis not present

## 2023-06-23 DIAGNOSIS — C20 Malignant neoplasm of rectum: Secondary | ICD-10-CM

## 2023-06-23 MED ORDER — ARMOUR THYROID 60 MG PO TABS
60.0000 mg | ORAL_TABLET | Freq: Every day | ORAL | 1 refills | Status: DC
  Filled 2023-06-23 – 2023-08-02 (×2): qty 90, 90d supply, fill #0

## 2023-06-23 MED ORDER — THYROID 30 MG PO TABS
30.0000 mg | ORAL_TABLET | Freq: Every day | ORAL | 2 refills | Status: DC
Start: 2023-06-23 — End: 2023-12-01
  Filled 2023-06-23: qty 13, 90d supply, fill #0

## 2023-06-23 MED ORDER — HEPARIN SOD (PORK) LOCK FLUSH 100 UNIT/ML IV SOLN
500.0000 [IU] | Freq: Once | INTRAVENOUS | Status: AC | PRN
  Administered 2023-06-23: 500 [IU]

## 2023-06-23 MED ORDER — SODIUM CHLORIDE 0.9% FLUSH
10.0000 mL | INTRAVENOUS | Status: DC | PRN
Start: 2023-06-23 — End: 2023-06-23
  Administered 2023-06-23: 10 mL

## 2023-06-23 NOTE — Telephone Encounter (Signed)
 Nurses-please see my result note and discussed them with Eber Jones thank you

## 2023-06-23 NOTE — Progress Notes (Signed)
 Samantha Reynolds presents to have home infusion pump d/c'd and for port-a-cath deaccess with flush.  Portacath located left chest wall accessed with  H 20 needle.  Good blood return present. Portacath flushed with NS and 500U/23ml Heparin, and needle removed intact.  Procedure tolerated well and without incident.

## 2023-06-23 NOTE — Patient Instructions (Signed)
 [facility] [place] [facility]  Discharge Instructions: Thank you for choosing [facility] to provide your oncology and hematology care.  If you have a lab appointment with the Cancer Center - please note that after April 8th, 2024, all labs will be drawn in the cancer center.  You do not have to check in or register with the main entrance as you have in the past but will complete your check-in in the cancer center.  Wear comfortable clothing and clothing appropriate for easy access to any Portacath or PICC line.   We strive to give you quality time with your provider. You may need to reschedule your appointment if you arrive late (15 or more minutes).  Arriving late affects you and other patients whose appointments are after yours.  Also, if you miss three or more appointments without notifying the office, you may be dismissed from the clinic at the provider's discretion.      For prescription refill requests, have your pharmacy contact our office and allow 72 hours for refills to be completed.    Today your ambulatory chemotherapy pump was disconnected per protocol   To help prevent nausea and vomiting after your treatment, we encourage you to take your nausea medication as directed.  BELOW ARE SYMPTOMS THAT SHOULD BE REPORTED IMMEDIATELY: *FEVER GREATER THAN 100.4 F (38 C) OR HIGHER *CHILLS OR SWEATING *NAUSEA AND VOMITING THAT IS NOT CONTROLLED WITH YOUR NAUSEA MEDICATION *UNUSUAL SHORTNESS OF BREATH *UNUSUAL BRUISING OR BLEEDING *URINARY PROBLEMS (pain or burning when urinating, or frequent urination) *BOWEL PROBLEMS (unusual diarrhea, constipation, pain near the anus) TENDERNESS IN MOUTH AND THROAT WITH OR WITHOUT PRESENCE OF ULCERS (sore throat, sores in mouth, or a toothache) UNUSUAL RASH, SWELLING OR PAIN  UNUSUAL VAGINAL DISCHARGE OR ITCHING   Items with * indicate a potential emergency and should be followed up as soon as  possible or go to the Emergency Department if any problems should occur.  Please show the CHEMOTHERAPY ALERT CARD or IMMUNOTHERAPY ALERT CARD at check-in to the Emergency Department and triage nurse.  Should you have questions after your visit or need to cancel or reschedule your appointment, please contact [facility] [place] - A DEPT OF [facility] [phone]  and follow the prompts.  Office hours are 8:00 a.m. to 4:30 p.m. Monday - Friday. Please note that voicemails left after 4:00 p.m. may not be returned until the following business day.  We are closed weekends and major holidays. You have access to a nurse at all times for urgent questions. Please call the main number to the clinic [phone] and follow the prompts.  For any non-urgent questions, you may also contact your provider using MyChart. We now offer e-Visits for anyone 72 and older to request care online for non-urgent symptoms. For details visit [email or web address].   Also download the MyChart app! Go to the app store, search "MyChart", open the app, select [place], and log in with your MyChart username and password.

## 2023-06-28 ENCOUNTER — Ambulatory Visit (HOSPITAL_COMMUNITY)
Admission: RE | Admit: 2023-06-28 | Discharge: 2023-06-28 | Disposition: A | Discharge status: Home / Self Care | Payer: 59 | Source: Ambulatory Visit | Attending: Radiation Oncology | Admitting: Radiation Oncology

## 2023-06-28 DIAGNOSIS — I6782 Cerebral ischemia: Secondary | ICD-10-CM | POA: Diagnosis not present

## 2023-06-28 DIAGNOSIS — C801 Malignant (primary) neoplasm, unspecified: Secondary | ICD-10-CM | POA: Diagnosis not present

## 2023-06-28 DIAGNOSIS — C7931 Secondary malignant neoplasm of brain: Secondary | ICD-10-CM | POA: Diagnosis not present

## 2023-06-28 MED ORDER — GADOBUTROL 1 MMOL/ML IV SOLN: 60.0000 mL | Freq: Once | INTRAVENOUS | Status: DC | PRN

## 2023-06-28 MED ORDER — GADOBUTROL 1 MMOL/ML IV SOLN
6.0000 mL | Freq: Once | INTRAVENOUS | Status: AC | PRN
  Administered 2023-06-28: 6 mL via INTRAVENOUS

## 2023-06-29 ENCOUNTER — Encounter: Payer: Self-pay | Admitting: Hematology

## 2023-06-30 ENCOUNTER — Encounter: Payer: Self-pay | Admitting: Radiation Oncology

## 2023-06-30 NOTE — Progress Notes (Signed)
 Telephone nursing appointment for review of most recent MRI-Brain results. I verified patient's identity x2 and began nursing interview.   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  -Fatigue- Yes- mild  -Skin changes- No  -Dexamethasone- No  -Weakness or loss of control of the extremities- No  -Headache- No  -Seizure or uncontrolled movement- No  -Symptoms of changes in vision- No  -Changes in speech- No  -Confusion- No  Patient denies any related issues at this time.  SAFETY ISSUES: Prior radiation? Yes First Treatment Date: 2022-08-31 - Last Treatment Date: 2022-08-31   Pacemaker/ICD? no Possible current pregnancy? no Is the patient on methotrexate? no  Additional Complaints / other details: None   Meaningful use complete.   Patient aware of their telephone appointment w/ Laurence Aly PA-C. I left my extension [phone] in case patient needs anything. Patient verbalized understanding. This concludes the nursing interview.   Patient preferred phone # [phone]     Ruel Favors, LPN

## 2023-07-03 ENCOUNTER — Ambulatory Visit
Admission: RE | Admit: 2023-07-03 | Discharge: 2023-07-03 | Disposition: A | Discharge status: Home / Self Care | Payer: 59 | Source: Ambulatory Visit | Attending: Radiation Oncology | Admitting: Radiation Oncology

## 2023-07-03 VITALS — Ht 66.5 in | Wt 135.0 lb

## 2023-07-03 DIAGNOSIS — C787 Secondary malignant neoplasm of liver and intrahepatic bile duct: Secondary | ICD-10-CM

## 2023-07-03 DIAGNOSIS — C7931 Secondary malignant neoplasm of brain: Secondary | ICD-10-CM

## 2023-07-12 ENCOUNTER — Other Ambulatory Visit: Payer: Self-pay | Admitting: Radiation Therapy

## 2023-07-12 DIAGNOSIS — C7931 Secondary malignant neoplasm of brain: Secondary | ICD-10-CM

## 2023-07-12 NOTE — Progress Notes (Signed)
 Radiation Oncology         [phone] ________________________________  Outpatient Follow Up- Conducted via telephone at patient request.  I spoke with the patient to conduct this  visit via telephone. The patient was notified in advance and was offered an in person or telemedicine meeting to allow for face to face communication but instead preferred to proceed with a telephone visit.     Name: Samantha Reynolds MRN: [id number]  Date of Service: 07/03/2023  DOB: October 05, 1965   Diagnosis:  Progressive Metastatic Adenocarcinoma of the rectum.  Interval Since Last Radiation:       08/31/22  SRS Treatment [id number] was treated to 20 Gy in 1 fraction is SRS technique  07/14/22-08/03/22: Site: Cervical Spine Technique: 3D Mode: Photon Dose Per Fraction: 2.5 Gy Prescribed Dose (Delivered / Prescribed): 37.5 Gy / 37.5 Gy Prescribed Fxs (Delivered / Prescribed): 15 / 15  09/21/2021 through 10/05/2021 Ultrahypofractionated  Treatment Site Technique Total Dose (Gy) Dose per Fx (Gy) Completed Fx Beam Energies  Lung, Left: Multifocal Left lung and Subcarinal node IMRT 50/50 5 10/10 6XFFF    08/06/20-08/13/20:  SBRT Treatment The tumor in the RUL was treated with a course of stereotactic body radiation treatment. The patient received 54 Gy In 3 fractions at 18 G per fraction.  12/04/2019-12/13/2019: SBRT Treatment The patient was treated to the liver with a course of stereotactic body radiation treatment.  The patient received 60 Gray in 5 fractions using a SBRT/IMRT technique, with 3 fields.  11/15/19 - 11/22/19:  SBRT Treatment The patient was treated to the left lower lobe with a course of stereotactic body radiation treatment.  The patient received 54 Gray in 3 fractions using a SBRT/IMRT technique, with 3 fields.  07/30/2018-09/06/2018: The rectum was treated to 45 Gy in 25 fractions, and 5.4 Gy boost in 3 fractions  Narrative:  The patient is a delightful 58 y.o. 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 She received total neoadjuvant chemotherapy and completed this in April 2020 followed by chemoRT in the spring of 2020. She subsequently underwent robotic LAR with loop ileostomy in September 2020 with 2 cm of residual disease but negative nodes and margins. She had reversal of the ileostomy in January 2021, also microwave ablation to a right hepatic lesion by Dr. Selena Batten at [facility] in May 2021. She had another lesion that was difficult to access for ablation. In the interim of additional imaging, a lesion in the left lung was noted and treated with SBRT in August 2021. She also was able to undergo SBRT to the liver lesion in September 2021.  She developed additional lung disease and was treated  to a RUL target in the lung in May of 2022 with ultrahypofractionated course with multifocal areas in the left lung and subcarinal station in July 2023.  She was found to have progression of disease involving the liver and right adrenal gland by diagnostic imaging.  A liver biopsy on 06/09/2022 confirmed active metastatic malignancy consistent with her rectal primary.  An MRI of the cervical spine on 07/01/2022 was also performed and confirmed a metastatic lesion in the C6 vertebral body extending to the right posterior elements with extraosseous tumor along with posterior endplate extending into the right C5-C6 and C6-C7 neural foramen.  She went on to receive palliative radiation to her cervical spine and during the course of her therapy complained of numbness in her face which prompted an MRI of the brain on  08/18/2022 which showed a 4 mm enhancing area of focus in the left frontal calvarium as well as a 3 mm nodular enhancing lesion in the anterior right frontal lobe of the brain.  She went on to receive stereotactic radiosurgery which she completed on 08/31/2022.  Over the course of her surveillance for her brain there have been comments on a lesion in the left frontal  calvarium which has remained stable in retrospect, the patient does have a remote history of trauma after a fall during a syncopal event.  This area also is to be followed.  Her most recent MRI of the brain on 06/28/23 showed no evidence of intracranial disease and stable changes in the left frontal calvarium as well as mild cerebral white matter chronic small vessel ischemic disease.  She continues with systemic 5-FU, leucovorin and Avastin.She is contacted today over the phone to review her MRI results.  On review of systems, the patient feels that she is doing pretty well.  She denies any headaches changes in visual or auditory function, seizures or difficulty with movement.  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  No other complaints are verbalized.   PAST MEDICAL HISTORY:  Past Medical History:  Diagnosis Date   Anxiety    Cancer (HCC) 2013   thyroid   Endometrial polyp    Endometrial thickening on ultra sound    GERD (gastroesophageal reflux disease)    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   Worse in her 71s when she was ill   H/O Hashimoto thyroiditis    History of chemotherapy    History of radiation therapy    History of thyroid cancer no recurrence   2013--  s/p  left lobe thyroidectomy--  follicular varient papillary / lymphocyctic thyroiditis   Hyperlipidemia 11/09/2015   Hypothyroidism    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HCC) 02/26/2018    PAST SURGICAL HISTORY: Past Surgical History:  Procedure Laterality Date   BIOPSY  02/19/2018   Procedure: BIOPSY;  Surgeon: Malissa Hippo, MD;  [facility];  Service: Endoscopy;;  rectum   COLONOSCOPY N/A 08/20/2015   Procedure: COLONOSCOPY;  Surgeon: Malissa Hippo, MD;  [facility];  Service: Endoscopy;  Laterality: N/A;  730   COLONOSCOPY WITH PROPOFOL N/A 07/21/2021   Procedure: COLONOSCOPY WITH PROPOFOL;  Surgeon: Malissa Hippo, MD;  [facility];  Service: Endoscopy;  Laterality: N/A;  10:10   D & C HYSTEROOSCOPY W/ THERMACHOICE ENDOMETRIAL ABLATION  08-13-2004   DIVERTING ILEOSTOMY N/A 12/07/2018   Procedure: DIVERTING LOOP ILEOSTOMY;  Surgeon: Romie Levee, MD;  [facility];  Service: General;  Laterality: N/A;   FLEXIBLE SIGMOIDOSCOPY N/A 02/19/2018   Procedure: FLEXIBLE SIGMOIDOSCOPY;  Surgeon: Malissa Hippo, MD;  [facility];  Service: Endoscopy;  Laterality: N/A;   HYSTEROSCOPY WITH D & C N/A 04/30/2015   Procedure: DILATATION AND CURETTAGE / INTENDED HYSTEROSCOPY;  Surgeon: Marcelle Overlie, MD;  Location: [facility];  Service: Gynecology;  Laterality: N/A;   ILEOSTOMY CLOSURE N/A 04/17/2019   Procedure: LOOP ILEOSTOMY REVERSAL;  Surgeon: Romie Levee, MD;  [facility];  Service: General;  Laterality: N/A;   IR US GUIDE BX ASP/DRAIN  03/09/2018   LAPAROSCOPIC CHOLECYSTECTOMY  04/1996   LAPAROSCOPY N/A 12/11/2018   Procedure: LAPAROSCOPY  ILEOSTOMY REVISION AND ABDOMINAL WASHOUT;  Surgeon: Romie Levee, MD;  [facility];  Service: General;  Laterality: N/A;   Liver Microwave   10/17/2018   POLYPECTOMY  08/20/2015   Procedure: POLYPECTOMY;  Surgeon: Ruby Corporal, MD;  [facility];  Service: Endoscopy;;  Splenic flexure polypectomy   POLYPECTOMY  02/19/2018   Procedure: POLYPECTOMY;  Surgeon: Ruby Corporal, MD;  [facility];  Service: Endoscopy;;  rectum   PORTACATH PLACEMENT N/A 03/14/2018   Procedure: INSERTION PORT-A-CATH;  Surgeon: Alanda Allegra, MD;  [facility];  Service: General;  Laterality: N/A;   REDUCTION INCARCERATED UTERUS  06-20-2000   intrauterine preg. 13 wks /  urinary retention   THYROID LOBECTOMY  11/24/2011   Procedure: THYROID LOBECTOMY;  Surgeon: Keitha Pata, MD;  [facility];  Service: General;  Laterality: Left;  Left Thyroid Lobectomy   TUBAL LIGATION  2002   XI ROBOTIC ASSISTED  LOWER ANTERIOR RESECTION N/A 12/07/2018   Procedure: XI ROBOTIC ASSISTED LOWER ANTERIOR RESECTION, SPENIC FLEXURE IMMOBILIZATION, COLOANAL ANASTOMOSIS, RIGID PROCTOSCOPY;  Surgeon: Joyce Nixon, MD;  [facility];  Service: General;  Laterality: N/A;    PAST SOCIAL HISTORY:  Social History   Socioeconomic History   Marital status: Married    Spouse name: Not on file   Number of children: 4   Years of education: Not on file   Highest education level: Not on file  Occupational History    Comment: Systems developer at [employer]  Tobacco Use   Smoking status: Former    Current packs/day: 0.00    Average packs/day: 0.3 packs/day for 10.0 years (2.5 ttl pk-yrs)    Types: Cigarettes    Start date: 10/30/1981    Quit date: 10/31/1991    Years since quitting: 31.7   Smokeless tobacco: Never  Vaping Use   Vaping status: Never Used  Substance and Sexual Activity   Alcohol use: No   Drug use: No   Sexual activity: Not Currently  Other Topics Concern   Not on file  Social History Narrative   Lives with husband Lavonia Powers and 66 year old son Dino Frank   Husband is Technical brewer of [employer]   Social Drivers of Health   Financial Resource Strain: Low Risk  (02/26/2018)   Overall Financial Resource Strain (CARDIA)    Difficulty of Paying Living Expenses: Not hard at all  Food Insecurity: No Food Insecurity (06/30/2023)   Hunger Vital Sign    Worried About Running Out of Food in the Last Year: Never true    Ran Out of Food in the Last Year: Never true  Transportation Needs: No Transportation Needs (06/30/2023)   PRAPARE - Administrator, Civil Service (Medical): No    Lack of Transportation (Non-Medical): No  Physical Activity: Inactive (02/26/2018)   Exercise Vital Sign    Days of Exercise per Week: 0 days    Minutes of Exercise per Session: 0 min  Stress: Stress Concern Present (02/26/2018)   [employer] of Occupational Health - Occupational Stress Questionnaire    Feeling of  Stress : To some extent  Social Connections: Socially Integrated (02/26/2018)   Social Connection and Isolation Panel [NHANES]    Frequency of Communication with Friends and Family: More than three times a week    Frequency of Social Gatherings with Friends and Family: Twice a week    Attends Religious Services: More than 4 times per year    Active Member of [employer] or Organizations: Yes    Attends [employer]  or Organization Meetings: More than 4 times per year    Marital Status: Married  Catering manager Violence: Not At Risk (06/30/2023)   Humiliation, Afraid, Rape, and Kick questionnaire    Fear of Current or Ex-Partner: No    Emotionally Abused: No    Physically Abused: No    Sexually Abused: No    PAST FAMILY HISTORY: Family History  Problem Relation Age of Onset   Hypertension Mother    Hypertension Father    Prostate cancer Father    Cancer Maternal Aunt        spine/back   Cancer Paternal Grandfather        lung   Healthy Son    Healthy Son    Healthy Son    Healthy Son    Colon cancer Neg Hx     MEDICATIONS  Current Outpatient Medications  Medication Sig Dispense Refill   ALPRAZolam (XANAX) 0.5 MG tablet Take 1 tablet (0.5 mg total) by mouth 3 (three) times daily as needed for anxiety. 90 tablet 3   amitriptyline (ELAVIL) 10 MG tablet Take 1 tablet (10 mg total) by mouth 2 (two) times daily. 180 tablet 3   ARMOUR THYROID 60 MG tablet Take 1 tablet (60 mg total) by mouth daily. 90 tablet 1   loperamide (IMODIUM) 2 MG capsule Take 1 capsule (2 mg total) by mouth 3 (three) times daily. (Patient taking differently: Take 2 mg by mouth daily.) 30 capsule 0   Potassium Chloride 10 % SOLN Take 15 ml (20 mEq) by mouth daily. 900 mL 2   prochlorperazine (COMPAZINE) 10 MG tablet Take 1 tablet (10 mg total) by mouth every 6 (six) hours as needed for nausea or vomiting. 30 tablet 4   thyroid (ARMOUR THYROID) 30 MG tablet Take 1 tablet (30 mg total) by mouth daily before breakfast every  Monday. 21 tablet 2   No current facility-administered medications for this encounter.   Facility-Administered Medications Ordered in Other Encounters  Medication Dose Route Frequency Provider Last Rate Last Admin   clindamycin (CLEOCIN) 900 mg in dextrose 5 % 50 mL IVPB  900 mg Intravenous 60 min Pre-Op Thomas, Alicia, MD       And   gentamicin (GARAMYCIN) 350 mg in dextrose 5 % 50 mL IVPB  5 mg/kg Intravenous 60 min Pre-Op Thomas, Alicia, MD       clindamycin (CLEOCIN) 900 mg in dextrose 5 % 50 mL IVPB  900 mg Intravenous 60 min Pre-Op Thomas, Alicia, MD       And   gentamicin (GARAMYCIN) 5 mg/kg in dextrose 5 % 50 mL IVPB  5 mg/kg Intravenous 60 min Pre-Op Joyce Nixon, MD       sodium chloride 0.9 % 1,000 mL with potassium chloride 20 mEq, magnesium sulfate 2 g infusion   Intravenous Once Katragadda, Sreedhar, MD        ALLERGIES:  Allergies  Allergen Reactions   Oxaliplatin Other (See Comments)    Patient had hypersensitivity reaction to Oxaliplatin. Patient c/o itching in her ears and eyes. Pt also c/o being hot, flushed, dizzy with redness on pt's face, arms, and legs. Pt c/o tingling in her hands and legs. Pt also had a dry throat with coughing. See progress note from 09/07/22 at 1130. Patient was not able to complete Oxaliplatin infusion.   Second reaction 10/26/2022. Itching in ears, heart racing, & face flushing. See progress note from 10/26/2022. Able to complete infusion after medications.   Demerol [Meperidine] Itching  All over the body.   Penicillins Hives     childhood, does not remember if it spread all over the body or not. Did it involve swelling of the face/tongue/throat, SOB, or low BP? No Did it involve sudden or severe rash/hives, skin peeling, or any reaction on the inside of your mouth or nose? Yes Did you need to seek medical attention at a hospital or doctor's office? Yes When did it last happen?      childhood allergy If all above answers are "NO", may  proceed with cephalosporin use.    Levaquin [Levofloxacin] Other (See Comments)    Patient has Aortic Aneurysm and is not indicated with this diagnosis   Other Hives and Other (See Comments)    Fresh coconut    Vancomycin Rash    Will need Benadryl prior to administration per IV. "Red man syndrome"   Physical Exam: Wt Readings from Last 3 Encounters:  06/30/23 135 lb (61.2 kg)  06/21/23 135 lb (61.2 kg)  05/24/23 133 lb 9.6 oz (60.6 kg)   Temp Readings from Last 3 Encounters:  06/23/23 98.7 F (37.1 C) (Oral)  06/21/23 97.8 F (36.6 C) (Tympanic)  06/21/23 97.6 F (36.4 C) (Tympanic)   BP Readings from Last 3 Encounters:  06/23/23 122/85  06/21/23 120/82  06/21/23 134/79   Pulse Readings from Last 3 Encounters:  06/23/23 80  06/21/23 84  06/21/23 97   Unable to assess due to encounter type   Impression/Plan: 1. Progressive Metastatic Stage IV, [id number], adenocarcinoma of the rectum.  The patient is doing well and tolerating her systemic treatment with 5-FU, leucovorin, and Avastin.  We discussed the results of her MRI scan.  She is reassured by this, and will continue to follow-up with Dr. Katragadda while she continues her systemic treatment.  We discussed the rationale for repeat imaging in 3 to 4 months, she is motivated to have her scan in August so that this is not before an upcoming vacation. 2. Hoarseness, with symptoms of heartburn intermittently.  We discussed over-the-counter PPI therapy in case her symptoms are GERD related, we also discussed considering over-the-counter antihistamine or inhaled nasal steroids for seasonal rhinitis possibly contributing to her symptoms.  She will consider this and follow back up with Dr. Cheree Cords to determine if this needs further investigation depending on how she tolerates over-the-counter medication.      This encounter was conducted via telephone.  The patient has provided two factor identification and has given verbal  consent for this type of encounter and has been advised to only accept a meeting of this type in a secure network environment. The time spent during this encounter was 35 minutes including preparation, discussion, and coordination of the patient's care. The attendants for this meeting include Bettejane Brownie  and Dorotha Gasser   During the encounter,  Bettejane Brownie was located remotely Samantha Reynolds was located at at home.    Shelvia Dick, PAC

## 2023-07-13 ENCOUNTER — Other Ambulatory Visit: Payer: Self-pay

## 2023-07-14 ENCOUNTER — Other Ambulatory Visit: Payer: Self-pay

## 2023-07-19 ENCOUNTER — Inpatient Hospital Stay: Attending: Hematology

## 2023-07-19 ENCOUNTER — Inpatient Hospital Stay

## 2023-07-19 ENCOUNTER — Inpatient Hospital Stay: Admitting: Hematology

## 2023-07-19 VITALS — BP 114/72 | HR 86 | Temp 98.1°F | Resp 18

## 2023-07-19 VITALS — BP 120/85 | HR 97 | Temp 96.7°F | Resp 18 | Wt 132.5 lb

## 2023-07-19 DIAGNOSIS — D72819 Decreased white blood cell count, unspecified: Secondary | ICD-10-CM | POA: Diagnosis not present

## 2023-07-19 DIAGNOSIS — Z8249 Family history of ischemic heart disease and other diseases of the circulatory system: Secondary | ICD-10-CM | POA: Insufficient documentation

## 2023-07-19 DIAGNOSIS — D696 Thrombocytopenia, unspecified: Secondary | ICD-10-CM | POA: Diagnosis not present

## 2023-07-19 DIAGNOSIS — C20 Malignant neoplasm of rectum: Secondary | ICD-10-CM

## 2023-07-19 DIAGNOSIS — E876 Hypokalemia: Secondary | ICD-10-CM | POA: Diagnosis not present

## 2023-07-19 DIAGNOSIS — Z5112 Encounter for antineoplastic immunotherapy: Secondary | ICD-10-CM | POA: Diagnosis present

## 2023-07-19 DIAGNOSIS — Z808 Family history of malignant neoplasm of other organs or systems: Secondary | ICD-10-CM | POA: Insufficient documentation

## 2023-07-19 DIAGNOSIS — Z95828 Presence of other vascular implants and grafts: Secondary | ICD-10-CM

## 2023-07-19 DIAGNOSIS — Z5111 Encounter for antineoplastic chemotherapy: Secondary | ICD-10-CM | POA: Diagnosis present

## 2023-07-19 DIAGNOSIS — Z87891 Personal history of nicotine dependence: Secondary | ICD-10-CM | POA: Diagnosis not present

## 2023-07-19 DIAGNOSIS — C7931 Secondary malignant neoplasm of brain: Secondary | ICD-10-CM | POA: Diagnosis not present

## 2023-07-19 DIAGNOSIS — C787 Secondary malignant neoplasm of liver and intrahepatic bile duct: Secondary | ICD-10-CM | POA: Diagnosis not present

## 2023-07-19 DIAGNOSIS — Z79631 Long term (current) use of antimetabolite agent: Secondary | ICD-10-CM | POA: Insufficient documentation

## 2023-07-19 DIAGNOSIS — Z8042 Family history of malignant neoplasm of prostate: Secondary | ICD-10-CM | POA: Diagnosis not present

## 2023-07-19 DIAGNOSIS — Z801 Family history of malignant neoplasm of trachea, bronchus and lung: Secondary | ICD-10-CM | POA: Diagnosis not present

## 2023-07-19 DIAGNOSIS — Z79899 Other long term (current) drug therapy: Secondary | ICD-10-CM | POA: Insufficient documentation

## 2023-07-19 LAB — CBC WITH DIFFERENTIAL/PLATELET
Abs Immature Granulocytes: 0.01 10*3/uL (ref 0.00–0.07)
Basophils Absolute: 0 10*3/uL (ref 0.0–0.1)
Basophils Relative: 0 %
Eosinophils Absolute: 0 10*3/uL (ref 0.0–0.5)
Eosinophils Relative: 1 %
HCT: 31.7 % — ABNORMAL LOW (ref 36.0–46.0)
Hemoglobin: 10.9 g/dL — ABNORMAL LOW (ref 12.0–15.0)
Immature Granulocytes: 1 %
Lymphocytes Relative: 22 %
Lymphs Abs: 0.4 10*3/uL — ABNORMAL LOW (ref 0.7–4.0)
MCH: 35.4 pg — ABNORMAL HIGH (ref 26.0–34.0)
MCHC: 34.4 g/dL (ref 30.0–36.0)
MCV: 102.9 fL — ABNORMAL HIGH (ref 80.0–100.0)
Monocytes Absolute: 0.2 10*3/uL (ref 0.1–1.0)
Monocytes Relative: 8 %
Neutro Abs: 1.3 10*3/uL — ABNORMAL LOW (ref 1.7–7.7)
Neutrophils Relative %: 68 %
Platelets: 45 10*3/uL — ABNORMAL LOW (ref 150–400)
RBC: 3.08 MIL/uL — ABNORMAL LOW (ref 3.87–5.11)
RDW: 15 % (ref 11.5–15.5)
WBC: 2 10*3/uL — ABNORMAL LOW (ref 4.0–10.5)
nRBC: 0 % (ref 0.0–0.2)

## 2023-07-19 LAB — COMPREHENSIVE METABOLIC PANEL WITH GFR
ALT: 18 U/L (ref 0–44)
AST: 30 U/L (ref 15–41)
Albumin: 3.6 g/dL (ref 3.5–5.0)
Alkaline Phosphatase: 123 U/L (ref 38–126)
Anion gap: 9 (ref 5–15)
BUN: 15 mg/dL (ref 6–20)
CO2: 22 mmol/L (ref 22–32)
Calcium: 8.9 mg/dL (ref 8.9–10.3)
Chloride: 106 mmol/L (ref 98–111)
Creatinine, Ser: 0.83 mg/dL (ref 0.44–1.00)
GFR, Estimated: >60 mL/min (ref >60)
Glucose, Bld: 91 mg/dL (ref 70–99)
Potassium: 3.7 mmol/L (ref 3.5–5.1)
Sodium: 137 mmol/L (ref 135–145)
Total Bilirubin: 2.2 mg/dL — ABNORMAL HIGH (ref 0.0–1.2)
Total Protein: 6.6 g/dL (ref 6.5–8.1)

## 2023-07-19 LAB — URINALYSIS, DIPSTICK ONLY
Bilirubin Urine: NEGATIVE
Glucose, UA: NEGATIVE mg/dL
Hgb urine dipstick: NEGATIVE
Ketones, ur: NEGATIVE mg/dL
Nitrite: NEGATIVE
Protein, ur: 30 mg/dL — AB
Specific Gravity, Urine: 1.02 (ref 1.005–1.030)
pH: 5 (ref 5.0–8.0)

## 2023-07-19 LAB — MAGNESIUM: Magnesium: 2.2 mg/dL (ref 1.7–2.4)

## 2023-07-19 MED ORDER — PALONOSETRON HCL INJECTION 0.25 MG/5ML
0.2500 mg | Freq: Once | INTRAVENOUS | Status: AC
  Administered 2023-07-19: 0.25 mg via INTRAVENOUS
  Filled 2023-07-19: qty 5

## 2023-07-19 MED ORDER — DEXAMETHASONE SODIUM PHOSPHATE 10 MG/ML IJ SOLN
10.0000 mg | Freq: Once | INTRAMUSCULAR | Status: AC
  Administered 2023-07-19: 10 mg via INTRAVENOUS
  Filled 2023-07-19: qty 1

## 2023-07-19 MED ORDER — FLUOROURACIL CHEMO INJECTION 500 MG/10ML
320.0000 mg/m2 | Freq: Once | INTRAVENOUS | Status: AC
  Administered 2023-07-19: 500 mg via INTRAVENOUS
  Filled 2023-07-19: qty 10

## 2023-07-19 MED ORDER — SODIUM CHLORIDE 0.9% FLUSH
10.0000 mL | INTRAVENOUS | Status: DC | PRN
Start: 2023-07-19 — End: 2023-07-19
  Administered 2023-07-19: 10 mL via INTRAVENOUS

## 2023-07-19 MED ORDER — SODIUM CHLORIDE 0.9 % IV SOLN
1920.0000 mg/m2 | INTRAVENOUS | Status: DC
  Administered 2023-07-19: 3500 mg via INTRAVENOUS
  Filled 2023-07-19: qty 70

## 2023-07-19 MED ORDER — SODIUM CHLORIDE 0.9 % IV SOLN
320.0000 mg/m2 | Freq: Once | INTRAVENOUS | Status: AC
  Administered 2023-07-19: 540 mg via INTRAVENOUS
  Filled 2023-07-19: qty 27

## 2023-07-19 MED ORDER — FAMOTIDINE IN NACL 20-0.9 MG/50ML-% IV SOLN
20.0000 mg | Freq: Once | INTRAVENOUS | Status: AC
  Administered 2023-07-19: 20 mg via INTRAVENOUS
  Filled 2023-07-19: qty 50

## 2023-07-19 MED ORDER — BEVACIZUMAB-AWWB CHEMO INJECTION 400 MG/16ML
5.0000 mg/kg | Freq: Once | INTRAVENOUS | Status: AC
  Administered 2023-07-19: 300 mg via INTRAVENOUS
  Filled 2023-07-19: qty 12

## 2023-07-19 MED ORDER — SODIUM CHLORIDE 0.9 % IV SOLN: Freq: Once | INTRAVENOUS | Status: AC

## 2023-07-19 NOTE — Patient Instructions (Signed)
 [facility] [place] - A DEPT OF [facility]  Discharge Instructions: Thank you for choosing [facility] to provide your oncology and hematology care.  If you have a lab appointment with the Cancer Center - please note that after April 8th, 2024, all labs will be drawn in the cancer center.  You do not have to check in or register with the main entrance as you have in the past but will complete your check-in in the cancer center.  Wear comfortable clothing and clothing appropriate for easy access to any Portacath or PICC line.   We strive to give you quality time with your provider. You may need to reschedule your appointment if you arrive late (15 or more minutes).  Arriving late affects you and other patients whose appointments are after yours.  Also, if you miss three or more appointments without notifying the office, you may be dismissed from the clinic at the provider's discretion.      For prescription refill requests, have your pharmacy contact our office and allow 72 hours for refills to be completed.    Today you received the following chemotherapy and/or immunotherapy agents Leucovorin , Avastin , Adrucil    To help prevent nausea and vomiting after your treatment, we encourage you to take your nausea medication as directed.  BELOW ARE SYMPTOMS THAT SHOULD BE REPORTED IMMEDIATELY: *FEVER GREATER THAN 100.4 F (38 C) OR HIGHER *CHILLS OR SWEATING *NAUSEA AND VOMITING THAT IS NOT CONTROLLED WITH YOUR NAUSEA MEDICATION *UNUSUAL SHORTNESS OF BREATH *UNUSUAL BRUISING OR BLEEDING *URINARY PROBLEMS (pain or burning when urinating, or frequent urination) *BOWEL PROBLEMS (unusual diarrhea, constipation, pain near the anus) TENDERNESS IN MOUTH AND THROAT WITH OR WITHOUT PRESENCE OF ULCERS (sore throat, sores in mouth, or a toothache) UNUSUAL RASH, SWELLING OR PAIN  UNUSUAL VAGINAL DISCHARGE OR ITCHING   Items with * indicate a potential emergency and  should be followed up as soon as possible or go to the Emergency Department if any problems should occur.  Please show the CHEMOTHERAPY ALERT CARD or IMMUNOTHERAPY ALERT CARD at check-in to the Emergency Department and triage nurse.  Should you have questions after your visit or need to cancel or reschedule your appointment, please contact [facility] [place] - A DEPT OF [facility] [phone]  and follow the prompts.  Office hours are 8:00 a.m. to 4:30 p.m. Monday - Friday. Please note that voicemails left after 4:00 p.m. may not be returned until the following business day.  We are closed weekends and major holidays. You have access to a nurse at all times for urgent questions. Please call the main number to the clinic [phone] and follow the prompts.  For any non-urgent questions, you may also contact your provider using MyChart. We now offer e-Visits for anyone 57 and older to request care online for non-urgent symptoms. For details visit [email or web address].   Also download the MyChart app! Go to the app store, search "MyChart", open the app, select [place], and log in with your MyChart username and password.

## 2023-07-19 NOTE — Progress Notes (Signed)
 Labs reviewed today with MD. ANC 10., platelets 45. Ok to treat today per MD.   Treatment given per orders. Patient tolerated it well without problems. Vitals stable and discharged home from clinic ambulatory. Follow up as scheduled.

## 2023-07-19 NOTE — Progress Notes (Signed)
 Patients port flushed without difficulty.  Good blood return noted with no bruising or swelling noted at site.  Patient remains accessed for treatment.

## 2023-07-20 LAB — CEA: CEA: 3.5 ng/mL (ref 0.0–4.7)

## 2023-07-21 ENCOUNTER — Inpatient Hospital Stay

## 2023-07-21 VITALS — BP 123/84 | HR 99 | Temp 97.3°F | Resp 19

## 2023-07-21 DIAGNOSIS — C20 Malignant neoplasm of rectum: Secondary | ICD-10-CM

## 2023-07-21 DIAGNOSIS — Z5112 Encounter for antineoplastic immunotherapy: Secondary | ICD-10-CM | POA: Diagnosis not present

## 2023-07-21 MED ORDER — HEPARIN SOD (PORK) LOCK FLUSH 100 UNIT/ML IV SOLN
500.0000 [IU] | Freq: Once | INTRAVENOUS | Status: AC | PRN
  Administered 2023-07-21: 500 [IU]

## 2023-07-21 MED ORDER — SODIUM CHLORIDE 0.9% FLUSH
10.0000 mL | INTRAVENOUS | Status: DC | PRN
  Administered 2023-07-21: 10 mL

## 2023-07-21 NOTE — Progress Notes (Signed)
 Patient presents today for 5FU pump stop and disconnection after 46 hour continous infusion.   5FU pump deaccessed.  Patients port flushed without difficulty.  Good blood return noted with no bruising or swelling noted at site.  Needle removed intact.  Band aid applied.  VSS with discharge and left in satisfactory condition via wheelchair with no s/s of distress noted.

## 2023-07-21 NOTE — Patient Instructions (Signed)
 [facility] [place] - A DEPT OF [facility]  Discharge Instructions: Thank you for choosing [facility] to provide your oncology and hematology care.  If you have a lab appointment with the Cancer Center - please note that after April 8th, 2024, all labs will be drawn in the cancer center.  You do not have to check in or register with the main entrance as you have in the past but will complete your check-in in the cancer center.  Wear comfortable clothing and clothing appropriate for easy access to any Portacath or PICC line.   We strive to give you quality time with your provider. You may need to reschedule your appointment if you arrive late (15 or more minutes).  Arriving late affects you and other patients whose appointments are after yours.  Also, if you miss three or more appointments without notifying the office, you may be dismissed from the clinic at the provider's discretion.      For prescription refill requests, have your pharmacy contact our office and allow 72 hours for refills to be completed.    Today you received the following chemotherapy and/or immunotherapy agents pump stop      To help prevent nausea and vomiting after your treatment, we encourage you to take your nausea medication as directed.  BELOW ARE SYMPTOMS THAT SHOULD BE REPORTED IMMEDIATELY: *FEVER GREATER THAN 100.4 F (38 C) OR HIGHER *CHILLS OR SWEATING *NAUSEA AND VOMITING THAT IS NOT CONTROLLED WITH YOUR NAUSEA MEDICATION *UNUSUAL SHORTNESS OF BREATH *UNUSUAL BRUISING OR BLEEDING *URINARY PROBLEMS (pain or burning when urinating, or frequent urination) *BOWEL PROBLEMS (unusual diarrhea, constipation, pain near the anus) TENDERNESS IN MOUTH AND THROAT WITH OR WITHOUT PRESENCE OF ULCERS (sore throat, sores in mouth, or a toothache) UNUSUAL RASH, SWELLING OR PAIN  UNUSUAL VAGINAL DISCHARGE OR ITCHING   Items with * indicate a potential emergency and should be followed up  as soon as possible or go to the Emergency Department if any problems should occur.  Please show the CHEMOTHERAPY ALERT CARD or IMMUNOTHERAPY ALERT CARD at check-in to the Emergency Department and triage nurse.  Should you have questions after your visit or need to cancel or reschedule your appointment, please contact [facility] [place] - A DEPT OF [facility] [phone]  and follow the prompts.  Office hours are 8:00 a.m. to 4:30 p.m. Monday - Friday. Please note that voicemails left after 4:00 p.m. may not be returned until the following business day.  We are closed weekends and major holidays. You have access to a nurse at all times for urgent questions. Please call the main number to the clinic [phone] and follow the prompts.  For any non-urgent questions, you may also contact your provider using MyChart. We now offer e-Visits for anyone 60 and older to request care online for non-urgent symptoms. For details visit [email or web address].   Also download the MyChart app! Go to the app store, search "MyChart", open the app, select [place], and log in with your MyChart username and password.

## 2023-07-27 ENCOUNTER — Telehealth: Payer: Self-pay | Admitting: *Deleted

## 2023-07-27 NOTE — Telephone Encounter (Signed)
 Late entry Completed [employer] paperwork for claim [id number], sent to provider for review and signature. Received completed form then faxed to [phone].    Copy to [facility] H.I.M bin for items to be scanned to EMR Mailed to patient address on file. [address] [place] [address] .   No further instructions received or actions performed by this nurse.

## 2023-08-03 ENCOUNTER — Encounter (HOSPITAL_COMMUNITY): Payer: Self-pay | Admitting: Hematology

## 2023-08-03 ENCOUNTER — Encounter: Payer: Self-pay | Admitting: Hematology

## 2023-08-03 ENCOUNTER — Other Ambulatory Visit: Payer: Self-pay

## 2023-08-03 ENCOUNTER — Other Ambulatory Visit (HOSPITAL_COMMUNITY): Payer: Self-pay

## 2023-08-16 ENCOUNTER — Encounter: Payer: Self-pay | Admitting: Family Medicine

## 2023-08-16 ENCOUNTER — Other Ambulatory Visit (HOSPITAL_COMMUNITY): Payer: Self-pay

## 2023-08-16 ENCOUNTER — Inpatient Hospital Stay

## 2023-08-16 ENCOUNTER — Inpatient Hospital Stay: Attending: Hematology

## 2023-08-16 ENCOUNTER — Other Ambulatory Visit: Payer: Self-pay | Admitting: Family Medicine

## 2023-08-16 VITALS — BP 109/75 | HR 86 | Temp 97.3°F | Resp 18 | Wt 124.6 lb

## 2023-08-16 DIAGNOSIS — Z79899 Other long term (current) drug therapy: Secondary | ICD-10-CM | POA: Insufficient documentation

## 2023-08-16 DIAGNOSIS — C20 Malignant neoplasm of rectum: Secondary | ICD-10-CM | POA: Diagnosis not present

## 2023-08-16 DIAGNOSIS — Z5112 Encounter for antineoplastic immunotherapy: Secondary | ICD-10-CM | POA: Insufficient documentation

## 2023-08-16 DIAGNOSIS — C787 Secondary malignant neoplasm of liver and intrahepatic bile duct: Secondary | ICD-10-CM | POA: Diagnosis not present

## 2023-08-16 DIAGNOSIS — Z79631 Long term (current) use of antimetabolite agent: Secondary | ICD-10-CM | POA: Insufficient documentation

## 2023-08-16 DIAGNOSIS — Z5111 Encounter for antineoplastic chemotherapy: Secondary | ICD-10-CM | POA: Diagnosis not present

## 2023-08-16 LAB — COMPREHENSIVE METABOLIC PANEL WITH GFR
ALT: 15 U/L (ref 0–44)
AST: 24 U/L (ref 15–41)
Albumin: 3.6 g/dL (ref 3.5–5.0)
Alkaline Phosphatase: 144 U/L — ABNORMAL HIGH (ref 38–126)
Anion gap: 7 (ref 5–15)
BUN: 15 mg/dL (ref 6–20)
CO2: 23 mmol/L (ref 22–32)
Calcium: 8.8 mg/dL — ABNORMAL LOW (ref 8.9–10.3)
Chloride: 104 mmol/L (ref 98–111)
Creatinine, Ser: 0.76 mg/dL (ref 0.44–1.00)
GFR, Estimated: >60 mL/min (ref >60)
Glucose, Bld: 90 mg/dL (ref 70–99)
Potassium: 3.4 mmol/L — ABNORMAL LOW (ref 3.5–5.1)
Sodium: 134 mmol/L — ABNORMAL LOW (ref 135–145)
Total Bilirubin: 3.4 mg/dL — ABNORMAL HIGH (ref 0.0–1.2)
Total Protein: 7.1 g/dL (ref 6.5–8.1)

## 2023-08-16 LAB — MAGNESIUM: Magnesium: 2.2 mg/dL (ref 1.7–2.4)

## 2023-08-16 LAB — CBC WITH DIFFERENTIAL/PLATELET
Abs Immature Granulocytes: 0.01 10*3/uL (ref 0.00–0.07)
Basophils Absolute: 0 10*3/uL (ref 0.0–0.1)
Basophils Relative: 0 %
Eosinophils Absolute: 0 10*3/uL (ref 0.0–0.5)
Eosinophils Relative: 1 %
HCT: 34.1 % — ABNORMAL LOW (ref 36.0–46.0)
Hemoglobin: 11.9 g/dL — ABNORMAL LOW (ref 12.0–15.0)
Immature Granulocytes: 0 %
Lymphocytes Relative: 21 %
Lymphs Abs: 0.6 10*3/uL — ABNORMAL LOW (ref 0.7–4.0)
MCH: 35.7 pg — ABNORMAL HIGH (ref 26.0–34.0)
MCHC: 34.9 g/dL (ref 30.0–36.0)
MCV: 102.4 fL — ABNORMAL HIGH (ref 80.0–100.0)
Monocytes Absolute: 0.3 10*3/uL (ref 0.1–1.0)
Monocytes Relative: 10 %
Neutro Abs: 2 10*3/uL (ref 1.7–7.7)
Neutrophils Relative %: 68 %
Platelets: 72 10*3/uL — ABNORMAL LOW (ref 150–400)
RBC: 3.33 MIL/uL — ABNORMAL LOW (ref 3.87–5.11)
RDW: 14.8 % (ref 11.5–15.5)
WBC: 3 10*3/uL — ABNORMAL LOW (ref 4.0–10.5)
nRBC: 0 % (ref 0.0–0.2)

## 2023-08-16 LAB — URINALYSIS, DIPSTICK ONLY
Bilirubin Urine: NEGATIVE
Glucose, UA: NEGATIVE mg/dL
Hgb urine dipstick: NEGATIVE
Ketones, ur: NEGATIVE mg/dL
Nitrite: NEGATIVE
Protein, ur: 30 mg/dL — AB
Specific Gravity, Urine: 1.019 (ref 1.005–1.030)
pH: 5 (ref 5.0–8.0)

## 2023-08-16 MED ORDER — SODIUM CHLORIDE 0.9 % IV SOLN
1920.0000 mg/m2 | INTRAVENOUS | Status: DC
  Administered 2023-08-16: 3500 mg via INTRAVENOUS
  Filled 2023-08-16: qty 70

## 2023-08-16 MED ORDER — BEVACIZUMAB-AWWB CHEMO INJECTION 400 MG/16ML
5.0000 mg/kg | Freq: Once | INTRAVENOUS | Status: AC
  Administered 2023-08-16: 300 mg via INTRAVENOUS
  Filled 2023-08-16: qty 12

## 2023-08-16 MED ORDER — FLUOROURACIL CHEMO INJECTION 500 MG/10ML
320.0000 mg/m2 | Freq: Once | INTRAVENOUS | Status: AC
  Administered 2023-08-16: 500 mg via INTRAVENOUS
  Filled 2023-08-16: qty 10

## 2023-08-16 MED ORDER — POTASSIUM CHLORIDE CRYS ER 20 MEQ PO TBCR
40.0000 meq | EXTENDED_RELEASE_TABLET | Freq: Once | ORAL | Status: AC
  Administered 2023-08-16: 40 meq via ORAL
  Filled 2023-08-16: qty 2

## 2023-08-16 MED ORDER — PALONOSETRON HCL INJECTION 0.25 MG/5ML
0.2500 mg | Freq: Once | INTRAVENOUS | Status: AC
  Administered 2023-08-16: 0.25 mg via INTRAVENOUS
  Filled 2023-08-16: qty 5

## 2023-08-16 MED ORDER — SODIUM CHLORIDE 0.9 % IV SOLN: Freq: Once | INTRAVENOUS | Status: AC

## 2023-08-16 MED ORDER — FAMOTIDINE IN NACL 20-0.9 MG/50ML-% IV SOLN
20.0000 mg | Freq: Once | INTRAVENOUS | Status: AC
  Administered 2023-08-16: 20 mg via INTRAVENOUS
  Filled 2023-08-16: qty 50

## 2023-08-16 MED ORDER — SODIUM CHLORIDE 0.9% FLUSH
10.0000 mL | Freq: Once | INTRAVENOUS | Status: AC
  Administered 2023-08-16: 10 mL via INTRAVENOUS

## 2023-08-16 MED ORDER — DEXAMETHASONE SODIUM PHOSPHATE 10 MG/ML IJ SOLN
10.0000 mg | Freq: Once | INTRAMUSCULAR | Status: AC
  Administered 2023-08-16: 10 mg via INTRAVENOUS
  Filled 2023-08-16: qty 1

## 2023-08-16 MED ORDER — ALPRAZOLAM 0.5 MG PO TABS
0.5000 mg | ORAL_TABLET | Freq: Three times a day (TID) | ORAL | 3 refills | Status: DC | PRN
  Filled 2023-08-16 – 2023-08-31 (×2): qty 90, 30d supply, fill #0
  Filled 2023-11-06: qty 90, 30d supply, fill #1

## 2023-08-16 MED ORDER — LEUCOVORIN CALCIUM INJECTION 350 MG
320.0000 mg/m2 | Freq: Once | INTRAVENOUS | Status: AC
  Administered 2023-08-16: 540 mg via INTRAVENOUS
  Filled 2023-08-16: qty 27

## 2023-08-16 NOTE — Telephone Encounter (Signed)
 Nurses I read through [employer] I sent in refills of her medicine to [facility] pharmacy This should last her through the summer Please work with her directly to schedule a follow-up visit with me on the day that is convenient for her given her treatments that she is going through with cancer we can see her anywhere in the next 30 days on one of my open slot days thanks-Dr. Geralyn Knee

## 2023-08-16 NOTE — Patient Instructions (Signed)
 [facility] [place] - A DEPT OF [facility]  Discharge Instructions: Thank you for choosing [facility] to provide your oncology and hematology care.  If you have a lab appointment with the Cancer Center - please note that after April 8th, 2024, all labs will be drawn in the cancer center.  You do not have to check in or register with the main entrance as you have in the past but will complete your check-in in the cancer center.  Wear comfortable clothing and clothing appropriate for easy access to any Portacath or PICC line.   We strive to give you quality time with your provider. You may need to reschedule your appointment if you arrive late (15 or more minutes).  Arriving late affects you and other patients whose appointments are after yours.  Also, if you miss three or more appointments without notifying the office, you may be dismissed from the clinic at the provider's discretion.      For prescription refill requests, have your pharmacy contact our office and allow 72 hours for refills to be completed.    Today you received the following chemotherapy and/or immunotherapy agents MVASI/Leucovorin/5FU.  Bevacizumab Injection What is this medication? BEVACIZUMAB (be va SIZ yoo mab) treats some types of cancer. It works by blocking a protein that causes cancer cells to grow and multiply. This helps to slow or stop the spread of cancer cells. It is a monoclonal antibody. This medicine may be used for other purposes; ask your health care provider or pharmacist if you have questions. COMMON BRAND NAME(S): Alymsys, Avastin, MVASI, [place] Lao What should I tell my care team before I take this medication? They need to know if you have any of these conditions: Blood clots Coughing up blood Having or recent surgery Heart failure High blood pressure History of a connection between 2 or more body parts that do not usually connect (fistula) History of a  tear in your stomach or intestines Protein in your urine An unusual or allergic reaction to bevacizumab, other medications, foods, dyes, or preservatives Pregnant or trying to get pregnant Breast-feeding How should I use this medication? This medication is injected into a vein. It is given by your care team in a hospital or clinic setting. Talk to your care team the use of this medication in children. Special care may be needed. Overdosage: If you think you have taken too much of this medicine contact a poison control center or emergency room at once. NOTE: This medicine is only for you. Do not share this medicine with others. What if I miss a dose? Keep appointments for follow-up doses. It is important not to miss your dose. Call your care team if you are unable to keep an appointment. What may interact with this medication? Interactions are not expected. This list may not describe all possible interactions. Give your health care provider a list of all the medicines, herbs, non-prescription drugs, or dietary supplements you use. Also tell them if you smoke, drink alcohol, or use illegal drugs. Some items may interact with your medicine. What should I watch for while using this medication? Your condition will be monitored carefully while you are receiving this medication. You may need blood work while taking this medication. This medication may make you feel generally unwell. This is not uncommon as chemotherapy can affect healthy cells as well as cancer cells. Report any side effects. Continue your course of treatment even though you feel  ill unless your care team tells you to stop. This medication may increase your risk to bruise or bleed. Call your care team if you notice any unusual bleeding. Before having surgery, talk to your care team to make sure it is ok. This medication can increase the risk of poor healing of your surgical site or wound. You will need to stop this medication for 28 days  before surgery. After surgery, wait at least 28 days before restarting this medication. Make sure the surgical site or wound is healed enough before restarting this medication. Talk to your care team if questions. Talk to your care team if you may be pregnant. Serious birth defects can occur if you take this medication during pregnancy and for 6 months after the last dose. Contraception is recommended while taking this medication and for 6 months after the last dose. Your care team can help you find the option that works for you. Do not breastfeed while taking this medication and for 6 months after the last dose. This medication can cause infertility. Talk to your care team if you are concerned about your fertility. What side effects may I notice from receiving this medication? Side effects that you should report to your care team as soon as possible: Allergic reactions--skin rash, itching, hives, swelling of the face, lips, tongue, or throat Bleeding--bloody or black, tar-like stools, vomiting blood or [place] material that looks like coffee grounds, red or dark [place] urine, small red or purple spots on skin, unusual bruising or bleeding Blood clot--pain, swelling, or warmth in the leg, shortness of breath, chest pain Heart attack--pain or tightness in the chest, shoulders, arms, or jaw, nausea, shortness of breath, cold or clammy skin, feeling faint or lightheaded Heart failure--shortness of breath, swelling of the ankles, feet, or hands, sudden weight gain, unusual weakness or fatigue Increase in blood pressure Infection--fever, chills, cough, sore throat, wounds that don't heal, pain or trouble when passing urine, general feeling of discomfort or being unwell Infusion reactions--chest pain, shortness of breath or trouble breathing, feeling faint or lightheaded Kidney injury--decrease in the amount of urine, swelling of the ankles, hands, or feet Stomach pain that is severe, does not go away, or gets  worse Stroke--sudden numbness or weakness of the face, arm, or leg, trouble speaking, confusion, trouble walking, loss of balance or coordination, dizziness, severe headache, change in vision Sudden and severe headache, confusion, change in vision, seizures, which may be signs of posterior reversible encephalopathy syndrome (PRES) Side effects that usually do not require medical attention (report to your care team if they continue or are bothersome): Back pain Change in taste Diarrhea Dry skin Increased tears Nosebleed This list may not describe all possible side effects. Call your doctor for medical advice about side effects. You may report side effects to FDA at 1-800-FDA-1088. Where should I keep my medication? This medication is given in a hospital or clinic. It will not be stored at home. NOTE: This sheet is a summary. It may not cover all possible information. If you have questions about this medicine, talk to your doctor, pharmacist, or health care provider.  2024 Elsevier/Gold Standard (2021-07-30 00:00:00)   Leucovorin Injection What is this medication? LEUCOVORIN (loo koe VOR in) prevents side effects from certain medications, such as methotrexate. It works by increasing folate levels. This helps protect healthy cells in your body. It may also be used to treat anemia caused by low levels of folate. It can also be used with fluorouracil, a type  of chemotherapy, to treat colorectal cancer. It works by increasing the effects of fluorouracil in the body. This medicine may be used for other purposes; ask your health care provider or pharmacist if you have questions. What should I tell my care team before I take this medication? They need to know if you have any of these conditions: Anemia from low levels of vitamin B12 in the blood An unusual or allergic reaction to leucovorin, folic acid, other medications, foods, dyes, or preservatives Pregnant or trying to get  pregnant Breastfeeding How should I use this medication? This medication is injected into a vein or a muscle. It is given by your care team in a hospital or clinic setting. Talk to your care team about the use of this medication in children. Special care may be needed. Overdosage: If you think you have taken too much of this medicine contact a poison control center or emergency room at once. NOTE: This medicine is only for you. Do not share this medicine with others. What if I miss a dose? Keep appointments for follow-up doses. It is important not to miss your dose. Call your care team if you are unable to keep an appointment. What may interact with this medication? Capecitabine Fluorouracil Phenobarbital Phenytoin Primidone Trimethoprim;sulfamethoxazole This list may not describe all possible interactions. Give your health care provider a list of all the medicines, herbs, non-prescription drugs, or dietary supplements you use. Also tell them if you smoke, drink alcohol, or use illegal drugs. Some items may interact with your medicine. What should I watch for while using this medication? Your condition will be monitored carefully while you are receiving this medication. This medication may increase the side effects of 5-fluorouracil. Tell your care team if you have diarrhea or mouth sores that do not get better or that get worse. What side effects may I notice from receiving this medication? Side effects that you should report to your care team as soon as possible: Allergic reactions--skin rash, itching, hives, swelling of the face, lips, tongue, or throat This list may not describe all possible side effects. Call your doctor for medical advice about side effects. You may report side effects to FDA at 1-800-FDA-1088. Where should I keep my medication? This medication is given in a hospital or clinic. It will not be stored at home. NOTE: This sheet is a summary. It may not cover all possible  information. If you have questions about this medicine, talk to your doctor, pharmacist, or health care provider.  2024 Elsevier/Gold Standard (2021-08-17 00:00:00)   Fluorouracil Injection What is this medication? FLUOROURACIL (flure oh YOOR a sil) treats some types of cancer. It works by slowing down the growth of cancer cells. This medicine may be used for other purposes; ask your health care provider or pharmacist if you have questions. COMMON BRAND NAME(S): Adrucil What should I tell my care team before I take this medication? They need to know if you have any of these conditions: Blood disorders Dihydropyrimidine dehydrogenase (DPD) deficiency Infection, such as chickenpox, cold sores, herpes Kidney disease Liver disease Poor nutrition Recent or ongoing radiation therapy An unusual or allergic reaction to fluorouracil, other medications, foods, dyes, or preservatives If you or your partner are pregnant or trying to get pregnant Breast-feeding How should I use this medication? This medication is injected into a vein. It is administered by your care team in a hospital or clinic setting. Talk to your care team about the use of this medication in children.  Special care may be needed. Overdosage: If you think you have taken too much of this medicine contact a poison control center or emergency room at once. NOTE: This medicine is only for you. Do not share this medicine with others. What if I miss a dose? Keep appointments for follow-up doses. It is important not to miss your dose. Call your care team if you are unable to keep an appointment. What may interact with this medication? Do not take this medication with any of the following: Live virus vaccines This medication may also interact with the following: Medications that treat or prevent blood clots, such as warfarin, enoxaparin, dalteparin This list may not describe all possible interactions. Give your health care provider a  list of all the medicines, herbs, non-prescription drugs, or dietary supplements you use. Also tell them if you smoke, drink alcohol, or use illegal drugs. Some items may interact with your medicine. What should I watch for while using this medication? Your condition will be monitored carefully while you are receiving this medication. This medication may make you feel generally unwell. This is not uncommon as chemotherapy can affect healthy cells as well as cancer cells. Report any side effects. Continue your course of treatment even though you feel ill unless your care team tells you to stop. In some cases, you may be given additional medications to help with side effects. Follow all directions for their use. This medication may increase your risk of getting an infection. Call your care team for advice if you get a fever, chills, sore throat, or other symptoms of a cold or flu. Do not treat yourself. Try to avoid being around people who are sick. This medication may increase your risk to bruise or bleed. Call your care team if you notice any unusual bleeding. Be careful brushing or flossing your teeth or using a toothpick because you may get an infection or bleed more easily. If you have any dental work done, tell your dentist you are receiving this medication. Avoid taking medications that contain aspirin, acetaminophen, ibuprofen, naproxen, or ketoprofen unless instructed by your care team. These medications may hide a fever. Do not treat diarrhea with over the counter products. Contact your care team if you have diarrhea that lasts more than 2 days or if it is severe and watery. This medication can make you more sensitive to the sun. Keep out of the sun. If you cannot avoid being in the sun, wear protective clothing and sunscreen. Do not use sun lamps, tanning beds, or tanning booths. Talk to your care team if you or your partner wish to become pregnant or think you might be pregnant. This medication  can cause serious birth defects if taken during pregnancy and for 3 months after the last dose. A reliable form of contraception is recommended while taking this medication and for 3 months after the last dose. Talk to your care team about effective forms of contraception. Do not father a child while taking this medication and for 3 months after the last dose. Use a condom while having sex during this time period. Do not breastfeed while taking this medication. This medication may cause infertility. Talk to your care team if you are concerned about your fertility. What side effects may I notice from receiving this medication? Side effects that you should report to your care team as soon as possible: Allergic reactions--skin rash, itching, hives, swelling of the face, lips, tongue, or throat Heart attack--pain or tightness in the chest,  shoulders, arms, or jaw, nausea, shortness of breath, cold or clammy skin, feeling faint or lightheaded Heart failure--shortness of breath, swelling of the ankles, feet, or hands, sudden weight gain, unusual weakness or fatigue Heart rhythm changes--fast or irregular heartbeat, dizziness, feeling faint or lightheaded, chest pain, trouble breathing High ammonia level--unusual weakness or fatigue, confusion, loss of appetite, nausea, vomiting, seizures Infection--fever, chills, cough, sore throat, wounds that don't heal, pain or trouble when passing urine, general feeling of discomfort or being unwell Low red blood cell level--unusual weakness or fatigue, dizziness, headache, trouble breathing Pain, tingling, or numbness in the hands or feet, muscle weakness, change in vision, confusion or trouble speaking, loss of balance or coordination, trouble walking, seizures Redness, swelling, and blistering of the skin over hands and feet Severe or prolonged diarrhea Unusual bruising or bleeding Side effects that usually do not require medical attention (report to your care team  if they continue or are bothersome): Dry skin Headache Increased tears Nausea Pain, redness, or swelling with sores inside the mouth or throat Sensitivity to light Vomiting This list may not describe all possible side effects. Call your doctor for medical advice about side effects. You may report side effects to FDA at 1-800-FDA-1088. Where should I keep my medication? This medication is given in a hospital or clinic. It will not be stored at home. NOTE: This sheet is a summary. It may not cover all possible information. If you have questions about this medicine, talk to your doctor, pharmacist, or health care provider.  2024 Elsevier/Gold Standard (2021-07-20 00:00:00)       To help prevent nausea and vomiting after your treatment, we encourage you to take your nausea medication as directed.  BELOW ARE SYMPTOMS THAT SHOULD BE REPORTED IMMEDIATELY: *FEVER GREATER THAN 100.4 F (38 C) OR HIGHER *CHILLS OR SWEATING *NAUSEA AND VOMITING THAT IS NOT CONTROLLED WITH YOUR NAUSEA MEDICATION *UNUSUAL SHORTNESS OF BREATH *UNUSUAL BRUISING OR BLEEDING *URINARY PROBLEMS (pain or burning when urinating, or frequent urination) *BOWEL PROBLEMS (unusual diarrhea, constipation, pain near the anus) TENDERNESS IN MOUTH AND THROAT WITH OR WITHOUT PRESENCE OF ULCERS (sore throat, sores in mouth, or a toothache) UNUSUAL RASH, SWELLING OR PAIN  UNUSUAL VAGINAL DISCHARGE OR ITCHING   Items with * indicate a potential emergency and should be followed up as soon as possible or go to the Emergency Department if any problems should occur.  Please show the CHEMOTHERAPY ALERT CARD or IMMUNOTHERAPY ALERT CARD at check-in to the Emergency Department and triage nurse.  Should you have questions after your visit or need to cancel or reschedule your appointment, please contact [facility] [place] - A DEPT OF [facility] [phone]  and follow the prompts.  Office hours are 8:00 a.m. to  4:30 p.m. Monday - Friday. Please note that voicemails left after 4:00 p.m. may not be returned until the following business day.  We are closed weekends and major holidays. You have access to a nurse at all times for urgent questions. Please call the main number to the clinic [phone] and follow the prompts.  For any non-urgent questions, you may also contact your provider using MyChart. We now offer e-Visits for anyone 19 and older to request care online for non-urgent symptoms. For details visit [email or web address].   Also download the MyChart app! Go to the app store, search "MyChart", open the app, select [place], and log in with your MyChart username and password.

## 2023-08-16 NOTE — Progress Notes (Signed)
 Patient presents today for chemotherapy infusion.  Patient is in satisfactory condition with no new complaints voiced.  Vital signs are stable.  Labs reviewed.  Bilirubin today is 3.4 and platelets are 72.  MD made aware.  All other labs are within treatment parameters.  Potassium today is 3.4.  We will give Klor Con 40 mEq PO x one dose today per standing orders by Dr. Cheree Cords.  We will proceed with treatment per MD orders.    Patient tolerated treatment well with no complaints voiced.  Home infusion 5FU pump connected.  Patient left ambulatory in stable condition.  Vital signs stable at discharge.  Follow up as scheduled.

## 2023-08-17 ENCOUNTER — Other Ambulatory Visit (HOSPITAL_COMMUNITY): Payer: Self-pay

## 2023-08-17 LAB — CEA: CEA: 3.1 ng/mL (ref 0.0–4.7)

## 2023-08-18 ENCOUNTER — Inpatient Hospital Stay

## 2023-08-18 VITALS — BP 118/82 | HR 102 | Temp 98.3°F | Resp 18

## 2023-08-18 DIAGNOSIS — Z79899 Other long term (current) drug therapy: Secondary | ICD-10-CM | POA: Diagnosis not present

## 2023-08-18 DIAGNOSIS — Z5112 Encounter for antineoplastic immunotherapy: Secondary | ICD-10-CM | POA: Diagnosis not present

## 2023-08-18 DIAGNOSIS — Z79631 Long term (current) use of antimetabolite agent: Secondary | ICD-10-CM | POA: Diagnosis not present

## 2023-08-18 DIAGNOSIS — Z5111 Encounter for antineoplastic chemotherapy: Secondary | ICD-10-CM | POA: Diagnosis not present

## 2023-08-18 DIAGNOSIS — C20 Malignant neoplasm of rectum: Secondary | ICD-10-CM

## 2023-08-18 MED ORDER — HEPARIN SOD (PORK) LOCK FLUSH 100 UNIT/ML IV SOLN
500.0000 [IU] | Freq: Once | INTRAVENOUS | Status: AC | PRN
  Administered 2023-08-18: 500 [IU]

## 2023-08-18 MED ORDER — SODIUM CHLORIDE 0.9% FLUSH
10.0000 mL | INTRAVENOUS | Status: DC | PRN
  Administered 2023-08-18: 10 mL

## 2023-08-18 NOTE — Patient Instructions (Signed)

## 2023-08-18 NOTE — Progress Notes (Signed)
 Patients port flushed without difficulty.  Good blood return noted with no bruising or swelling noted at site.  Home infusion 5FU pump disconnected.  Band aid applied.  Patient did note a slight swelling in L foot that was new onset.  Patient denied pain.  Patient advised to monitor and let us  know if swelling continued or symptoms worsened.  VSS with discharge and left in satisfactory condition with no s/s of distress noted.

## 2023-08-26 ENCOUNTER — Other Ambulatory Visit: Payer: Self-pay

## 2023-08-31 ENCOUNTER — Other Ambulatory Visit (HOSPITAL_COMMUNITY): Payer: Self-pay

## 2023-08-31 ENCOUNTER — Other Ambulatory Visit (HOSPITAL_BASED_OUTPATIENT_CLINIC_OR_DEPARTMENT_OTHER): Payer: Self-pay

## 2023-09-07 ENCOUNTER — Encounter (HOSPITAL_COMMUNITY)
Admission: RE | Admit: 2023-09-07 | Discharge: 2023-09-07 | Disposition: A | Discharge status: Home / Self Care | Source: Ambulatory Visit | Attending: Hematology | Admitting: Hematology

## 2023-09-07 DIAGNOSIS — C787 Secondary malignant neoplasm of liver and intrahepatic bile duct: Secondary | ICD-10-CM | POA: Diagnosis not present

## 2023-09-07 DIAGNOSIS — R918 Other nonspecific abnormal finding of lung field: Secondary | ICD-10-CM | POA: Diagnosis not present

## 2023-09-07 DIAGNOSIS — C20 Malignant neoplasm of rectum: Secondary | ICD-10-CM | POA: Insufficient documentation

## 2023-09-07 MED ORDER — FLUDEOXYGLUCOSE F - 18 (FDG) INJECTION
6.6500 | Freq: Once | INTRAVENOUS | Status: AC | PRN
  Administered 2023-09-07: 6.65 via INTRAVENOUS

## 2023-09-12 NOTE — Progress Notes (Signed)
 [facility] [address], [place] [address]    Clinic Day:  09/13/2023  Referring physician: Bennet Brasil, MD  Patient Care Team: Bennet Brasil, MD as PCP - General (Family Medicine) Paulett Boros, MD as Medical Oncologist (Medical Oncology)   ASSESSMENT & PLAN:   Assessment: 1.  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  Liver biopsy on 03/09/2018 consistent with adenocarcinoma. 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 -7 cycles of FOLFOX with vectibix  completed on 06/27/2018. -XRT with Xeloda  from 07/30/2018 through 09/06/2018. -Right liver lesion microwave ablation on 10/15/2018 at [facility]. -Low anterior resection and diverting ileostomy on 12/07/2018, pathology [id number], 0/6 lymph nodes positive, margins negative. -Loop ileostomy reversal on 04/17/2019. - Microwave ablation of the right hepatic lobe lesion by Dr. Burdette Carolin around 08/15/2019. -SBRT to the left lower lobe lung lesion and left liver lobe lesion on 12/13/2019 at [facility] - Right upper lobe lesion SBRT from 08/06/2020 through 08/13/2020. - 3 left lung lesions and subcarinal lymph node IMRT from 09/21/2021 through 10/05/2021. - Liver biopsy (06/09/2022): Metastatic moderately differentiated colonic adenocarcinoma. - NGS: KRAS/NRAS/BRAF wild-type, MSI-stable, HER2-0, negative for other targetable mutations - Cycle 1 of FOLFOX and bevacizumab  on 07/06/2022, oxaliplatin  discontinued from 11/09/2022 (cycle 8), 5-FU and bevacizumab  maintenance changed to every 3 weeks on 01/04/2023, further switched to every 4 weeks on 04/26/2023 for better quality of life. - XRT to the right neck from 07/14/2022 through 08/03/2022    Plan: 1.  Stage IV rectal adenocarcinoma, BRAF/RAS wild-type, MSI-stable: - She has been tolerating maintenance 5-FU and bevacizumab  every 4 weeks reasonably well. - After last treatment, she had developed  hoarseness and took Prilosec which caused her diarrhea.  She is now taking Pepcid  which is helping. - Labs today: Alk phos slightly improved at 136.  Bilirubin is 2.7 and stable.  Other LFTs are normal.  White count is 2.4 with ANC of 1.6.  Platelets are 59 and hemoglobin normal.  UA is negative for proteinuria. - PET scan (09/07/2023): Increased size and uptake of nodule in the superior segment left lower lobe worrisome for metastatic disease.  Today this measures 11 x 7 mm, SUV 5.6, previously measured 8 mm with SUV 2.4.  Other areas of lung nodularity with low-level uptake are stable.  Stable splenomegaly.  Dilation of ascending thoracic aorta 4.8 cm stable.  No skeletal metastatic disease. - CEA has been remaining stable in the normal range since September last year. - I would recommend CT chest to follow-up on the left lower lobe lung nodule in 3 months.  If it continues to grow, may consider SBRT.  Otherwise we will continue maintenance 5-FU and bevacizumab .  Plan to repeat PET scan in 6 months for tumor markers are staying stable.   2.  Leukopenia and thrombocytopenia: - She has leukopenia and thrombocytopenia from combination of chemotherapy and splenomegaly.  Will closely monitor.   3.  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  4.  Hypokalemia: - Continue liquid potassium 20 mill equivalents daily.  Potassium is normal.    Orders Placed This Encounter  Procedures   CT CHEST W CONTRAST    Standing Status:   Future    Expected Date:   12/14/2023    Expiration Date:   09/12/2024    If indicated for the ordered procedure, I authorize the administration of contrast media  per Radiology protocol:   Yes    Does the patient have a contrast media/X-ray dye allergy?:   No    Preferred imaging location?:   [facility] R Teague,acting as a scribe for Paulett Boros, MD.,have documented all  relevant documentation on the behalf of Paulett Boros, MD,as directed by  Paulett Boros, MD while in the presence of Paulett Boros, MD.  I, Paulett Boros MD, have reviewed the above documentation for accuracy and completeness, and I agree with the above.     Paulett Boros, MD   6/18/20253:31 PM  CHIEF COMPLAINT:   Diagnosis: metastatic rectal cancer    Cancer Staging  Malignant neoplasm of rectum [facility]) Staging form: Colon and Rectum, AJCC 8th Edition - Clinical stage from 03/13/2018: Stage IVA (cT3, cN1b, cM1a) - Signed by Paulett Boros, MD on 03/13/2018    Prior Therapy: 1. FOLFOX & vectibix  x 7 cycles from 03/14/2018 to 06/27/2018. 2. XRT with Xeloda  from 07/30/2018 to 09/06/2018. 3. Right liver lesion microwave ablation on 10/15/2018. 4. Low anterior resection and diverting ileostomy on 12/07/2018. 5. Right hepatic lobe microwave ablation on 08/15/19 6. SBRT to LLL and liver 11/15/2019 -12/13/2019. 7. SBRT to RUL 08/06/20 - 08/13/20 8. IMRT to 3 left lung lesions and subcarinal LN 09/21/21 - 10/05/21 9. XRT to C6 07/14/22 - 08/03/22 10. SRS to brain metastasis on 08/31/22 11. FOLFOX and bevacizumab , 8 cycles, 07/06/22 - 11/09/22  Current Therapy:  maintenance 5-FU and bevacizumab    HISTORY OF PRESENT ILLNESS:   Oncology History  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HCC)  02/26/2018 Initial Diagnosis   Rectal cancer (HCC)   03/13/2018 Cancer Staging   Staging form: Colon and Rectum, AJCC 8th Edition - Clinical stage from 03/13/2018: Stage IVA (cT3, cN1b, cM1a) - Signed by Paulett Boros, MD on 03/13/2018   03/14/2018 - 06/29/2018 Chemotherapy   The patient had palonosetron  (ALOXI ) injection 0.25 mg, 0.25 mg, Intravenous,  Once, 7 of 8 cycles Administration: 0.25 mg (03/14/2018), 0.25 mg (03/27/2018), 0.25 mg (04/18/2018), 0.25 mg (05/02/2018), 0.25 mg (05/16/2018), 0.25 mg (06/05/2018), 0.25 mg (06/27/2018) leucovorin  800 mg in dextrose  5 % 250 mL  infusion, 772 mg, Intravenous,  Once, 7 of 8 cycles Administration: 800 mg (03/14/2018), 800 mg (03/27/2018), 800 mg (05/02/2018), 800 mg (05/16/2018), 700 mg (04/18/2018), 800 mg (06/05/2018), 800 mg (06/27/2018) oxaliplatin  (ELOXATIN ) 165 mg in dextrose  5 % 500 mL chemo infusion, 85 mg/m2 = 165 mg, Intravenous,  Once, 6 of 7 cycles Dose modification: 68 mg/m2 (80 % of original dose 85 mg/m2, Cycle 4, Reason: Provider Judgment) Administration: 165 mg (03/14/2018), 165 mg (03/27/2018), 130 mg (05/02/2018), 130 mg (05/16/2018), 130 mg (06/05/2018), 130 mg (06/27/2018) panitumumab  (VECTIBIX ) 500 mg in sodium chloride  0.9 % 100 mL chemo infusion, 480 mg, Intravenous,  Once, 4 of 5 cycles Administration: 500 mg (04/18/2018), 480 mg (05/02/2018), 480 mg (05/16/2018), 480 mg (06/05/2018) fluorouracil  (ADRUCIL ) chemo injection 750 mg, 400 mg/m2 = 750 mg (100 % of original dose 400 mg/m2), Intravenous,  Once, 6 of 7 cycles Dose modification: 400 mg/m2 (original dose 400 mg/m2, Cycle 1), 400 mg/m2 (original dose 400 mg/m2, Cycle 3) Administration: 750 mg (03/14/2018), 750 mg (04/18/2018), 750 mg (05/02/2018), 750 mg (05/16/2018), 750 mg (06/05/2018), 750 mg (06/27/2018) fosaprepitant  (EMEND) 150 mg, dexamethasone  (DECADRON ) 12 mg in sodium chloride  0.9 % 145 mL IVPB, , Intravenous,  Once, 4 of 5 cycles Administration:  (05/02/2018),  (05/16/2018),  (06/05/2018),  (06/27/2018) fluorouracil  (ADRUCIL ) 4,650 mg in sodium  chloride 0.9 % 57 mL chemo infusion, 2,400 mg/m2 = 4,650 mg, Intravenous, 1 Day/Dose, 7 of 8 cycles Administration: 4,650 mg (03/14/2018), 4,650 mg (03/27/2018), 4,650 mg (04/18/2018), 4,650 mg (05/02/2018), 4,650 mg (05/16/2018), 4,650 mg (06/05/2018), 4,650 mg (06/27/2018)  for chemotherapy treatment.    07/06/2022 -  Chemotherapy   Patient is on Treatment Plan : COLORECTAL FOLFOX + Bevacizumab  q14d        INTERVAL HISTORY:   Samantha Reynolds is a 58 y.o. female presenting to clinic today for follow up of metastatic rectal cancer. She  was last seen by me on 06/21/23.  Since her last visit, she underwent restaging PET on 09/07/23.   Today, she states that she is doing well overall. Her appetite level is at 75%. Her energy level is at 75%.  PAST MEDICAL HISTORY:   Past Medical History: Past Medical History:  Diagnosis Date   Anxiety    Cancer (HCC) 2013   thyroid    Endometrial polyp    Endometrial thickening on ultra sound    GERD (gastroesophageal reflux disease)    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   Worse in her 56s when she was ill   H/O Hashimoto thyroiditis    History of chemotherapy    History of radiation therapy    History of thyroid  cancer no recurrence   2013--  s/p  left lobe thyroidectomy--  follicular varient papillary / lymphocyctic thyroiditis   Hyperlipidemia 11/09/2015   Hypothyroidism    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HCC) 02/26/2018    Surgical History: Past Surgical History:  Procedure Laterality Date   BIOPSY  02/19/2018   Procedure: BIOPSY;  Surgeon: Ruby Corporal, MD;  [facility];  Service: Endoscopy;;  rectum   COLONOSCOPY N/A 08/20/2015   Procedure: COLONOSCOPY;  Surgeon: Ruby Corporal, MD;  [facility];  Service: Endoscopy;  Laterality: N/A;  730   COLONOSCOPY WITH PROPOFOL  N/A 07/21/2021   Procedure: COLONOSCOPY WITH PROPOFOL ;  Surgeon: Ruby Corporal, MD;  [facility];  Service: Endoscopy;  Laterality: N/A;  10:10   D & C HYSTEROOSCOPY W/ THERMACHOICE ENDOMETRIAL ABLATION  08-13-2004   DIVERTING ILEOSTOMY N/A 12/07/2018   Procedure: DIVERTING LOOP ILEOSTOMY;  Surgeon: Joyce Nixon, MD;  [facility]: [facility];  Service: General;  Laterality: N/A;   FLEXIBLE SIGMOIDOSCOPY N/A 02/19/2018   Procedure: FLEXIBLE SIGMOIDOSCOPY;  Surgeon: Ruby Corporal, MD;  [facility];  Service: Endoscopy;  Laterality: N/A;   HYSTEROSCOPY WITH D & C N/A 04/30/2015   Procedure: DILATATION AND CURETTAGE / INTENDED HYSTEROSCOPY;  Surgeon: Thurman Flores, MD;   Location: [facility];  Service: Gynecology;  Laterality: N/A;   ILEOSTOMY CLOSURE N/A 04/17/2019   Procedure: LOOP ILEOSTOMY REVERSAL;  Surgeon: Joyce Nixon, MD;  Location: [facility];  Service: General;  Laterality: N/A;   IR US  GUIDE BX ASP/DRAIN  03/09/2018   LAPAROSCOPIC CHOLECYSTECTOMY  04/1996   LAPAROSCOPY N/A 12/11/2018   Procedure: LAPAROSCOPY  ILEOSTOMY REVISION AND ABDOMINAL WASHOUT;  Surgeon: Joyce Nixon, MD;  [facility];  Service: General;  Laterality: N/A;   Liver Microwave   10/17/2018   POLYPECTOMY  08/20/2015   Procedure: POLYPECTOMY;  Surgeon: Ruby Corporal, MD;  [facility];  Service: Endoscopy;;  Splenic flexure polypectomy   POLYPECTOMY  02/19/2018   Procedure: POLYPECTOMY;  Surgeon: Ruby Corporal, MD;  [facility];  Service: Endoscopy;;  rectum   PORTACATH PLACEMENT N/A 03/14/2018   Procedure: INSERTION PORT-A-CATH;  Surgeon: Alanda Allegra, MD;  Location: [facility];  Service: General;  Laterality: N/A;   REDUCTION INCARCERATED UTERUS  06-20-2000   intrauterine preg. 13 wks /  urinary retention   THYROID  LOBECTOMY  11/24/2011   Procedure: THYROID  LOBECTOMY;  Surgeon: Keitha Pata, MD;  Location: [facility];  Service: General;  Laterality: Left;  Left Thyroid  Lobectomy   TUBAL LIGATION  2002   XI ROBOTIC ASSISTED LOWER ANTERIOR RESECTION N/A 12/07/2018   Procedure: XI ROBOTIC ASSISTED LOWER ANTERIOR RESECTION, SPENIC FLEXURE IMMOBILIZATION, COLOANAL ANASTOMOSIS, RIGID PROCTOSCOPY;  Surgeon: Joyce Nixon, MD;  Location: [facility];  Service: General;  Laterality: N/A;    Social History: Social History   Socioeconomic History   Marital status: Married    Spouse name: Not on file   Number of children: 4   Years of education: Not on file   Highest education level: Not on file  Occupational History    Comment: Systems developer at [employer]  Tobacco Use   Smoking status: Former    Current packs/day: 0.00    Average packs/day: 0.3  packs/day for 10.0 years (2.5 ttl pk-yrs)    Types: Cigarettes    Start date: 10/30/1981    Quit date: 10/31/1991    Years since quitting: 31.8   Smokeless tobacco: Never  Vaping Use   Vaping status: Never Used  Substance and Sexual Activity   Alcohol use: No   Drug use: No   Sexual activity: Not Currently  Other Topics Concern   Not on file  Social History Narrative   Lives with husband Lavonia Powers and 66 year old son Dino Frank   Husband is Technical brewer of [employer]   Social Drivers of Health   Financial Resource Strain: Low Risk  (02/26/2018)   Overall Financial Resource Strain (CARDIA)    Difficulty of Paying Living Expenses: Not hard at all  Food Insecurity: No Food Insecurity (06/30/2023)   Hunger Vital Sign    Worried About Running Out of Food in the Last Year: Never true    Ran Out of Food in the Last Year: Never true  Transportation Needs: No Transportation Needs (06/30/2023)   PRAPARE - Administrator, Civil Service (Medical): No    Lack of Transportation (Non-Medical): No  Physical Activity: Inactive (02/26/2018)   Exercise Vital Sign    Days of Exercise per Week: 0 days    Minutes of Exercise per Session: 0 min  Stress: Stress Concern Present (02/26/2018)   [employer] of Occupational Health - Occupational Stress Questionnaire    Feeling of Stress : To some extent  Social Connections: Socially Integrated (02/26/2018)   Social Connection and Isolation Panel    Frequency of Communication with Friends and Family: More than three times a week    Frequency of Social Gatherings with Friends and Family: Twice a week    Attends Religious Services: More than 4 times per year    Active Member of [employer] or Organizations: Yes    Attends Engineer, structural: More than 4 times per year    Marital Status: Married  Catering manager Violence: Not At Risk (06/30/2023)   Humiliation, Afraid, Rape, and Kick questionnaire    Fear of Current or Ex-Partner: No    Emotionally  Abused: No    Physically Abused: No    Sexually Abused: No    Family History: Family History  Problem Relation Age of Onset   Hypertension Mother    Hypertension Father  Prostate cancer Father    Cancer Maternal Aunt        spine/back   Cancer Paternal Grandfather        lung   Healthy Son    Healthy Son    Healthy Son    Healthy Son    Colon cancer Neg Hx     Current Medications:  Current Outpatient Medications:    ALPRAZolam  (XANAX ) 0.5 MG tablet, Take 1 tablet (0.5 mg total) by mouth 3 (three) times daily as needed for anxiety., Disp: 90 tablet, Rfl: 3   amitriptyline  (ELAVIL ) 10 MG tablet, Take 1 tablet (10 mg total) by mouth 2 (two) times daily., Disp: 180 tablet, Rfl: 3   ARMOUR THYROID  60 MG tablet, Take 1 tablet (60 mg total) by mouth daily., Disp: 90 tablet, Rfl: 1   loperamide  (IMODIUM ) 2 MG capsule, Take 1 capsule (2 mg total) by mouth 3 (three) times daily. (Patient taking differently: Take 2 mg by mouth daily.), Disp: 30 capsule, Rfl: 0   Potassium Chloride  10 % SOLN, Take 15 ml (20 mEq) by mouth daily., Disp: 900 mL, Rfl: 2   prochlorperazine  (COMPAZINE ) 10 MG tablet, Take 1 tablet (10 mg total) by mouth every 6 (six) hours as needed for nausea or vomiting., Disp: 30 tablet, Rfl: 4   thyroid  (ARMOUR THYROID ) 30 MG tablet, Take 1 tablet (30 mg total) by mouth daily before breakfast every Monday., Disp: 21 tablet, Rfl: 2 No current facility-administered medications for this visit.  Facility-Administered Medications Ordered in Other Visits:    clindamycin  (CLEOCIN ) 900 mg in dextrose  5 % 50 mL IVPB, 900 mg, Intravenous, 60 min Pre-Op **AND** gentamicin  (GARAMYCIN ) 350 mg in dextrose  5 % 50 mL IVPB, 5 mg/kg, Intravenous, 60 min Pre-Op, Joyce Nixon, MD   clindamycin  (CLEOCIN ) 900 mg in dextrose  5 % 50 mL IVPB, 900 mg, Intravenous, 60 min Pre-Op **AND** gentamicin  (GARAMYCIN ) 5 mg/kg in dextrose  5 % 50 mL IVPB, 5 mg/kg, Intravenous, 60 min Pre-Op, Joyce Nixon, MD    fluorouracil  (ADRUCIL ) 3,500 mg in sodium chloride  0.9 % 80 mL chemo infusion, 1,920 mg/m2 (Treatment Plan Recorded), Intravenous, 1 day or 1 dose, Carthel Castille, MD, Infusion Verify at 09/13/23 1204   sodium chloride  0.9 % 1,000 mL with potassium chloride  20 mEq, magnesium  sulfate 2 g infusion, , Intravenous, Once, Paulett Boros, MD   Allergies: Allergies  Allergen Reactions   Oxaliplatin  Other (See Comments)    Patient had hypersensitivity reaction to Oxaliplatin . Patient c/o itching in her ears and eyes. Pt also c/o being hot, flushed, dizzy with redness on pt's face, arms, and legs. Pt c/o tingling in her hands and legs. Pt also had a dry throat with coughing. See progress note from 09/07/22 at 1130. Patient was not able to complete Oxaliplatin  infusion.   Second reaction 10/26/2022. Itching in ears, heart racing, & face flushing. See progress note from 10/26/2022. Able to complete infusion after medications.   Demerol  [Meperidine ] Itching    All over the body.   Penicillins Hives     childhood, does not remember if it spread all over the body or not. Did it involve swelling of the face/tongue/throat, SOB, or low BP? No Did it involve sudden or severe rash/hives, skin peeling, or any reaction on the inside of your mouth or nose? Yes Did you need to seek medical attention at a hospital or doctor's office? Yes When did it last happen?      childhood allergy If all above answers are "  NO", may proceed with cephalosporin use.    Levaquin  [Levofloxacin ] Other (See Comments)    Patient has Aortic Aneurysm and is not indicated with this diagnosis   Other Hives and Other (See Comments)    Fresh coconut    Vancomycin  Rash    Will need Benadryl  prior to administration per IV. Red man syndrome    REVIEW OF SYSTEMS:   Review of Systems  Constitutional:  Negative for chills, fatigue and fever.  HENT:   Negative for lump/mass, mouth sores, nosebleeds, sore throat and trouble  swallowing.   Eyes:  Negative for eye problems.  Respiratory:  Positive for cough. Negative for shortness of breath.   Cardiovascular:  Negative for chest pain, leg swelling and palpitations.  Gastrointestinal:  Positive for constipation and diarrhea. Negative for abdominal pain, nausea and vomiting.  Genitourinary:  Negative for bladder incontinence, difficulty urinating, dysuria, frequency, hematuria and nocturia.   Musculoskeletal:  Negative for arthralgias, back pain, flank pain, myalgias and neck pain.  Skin:  Negative for itching and rash.  Neurological:  Positive for numbness. Negative for dizziness and headaches.  Hematological:  Does not bruise/bleed easily.  Psychiatric/Behavioral:  Negative for depression, sleep disturbance and suicidal ideas. The patient is not nervous/anxious.   All other systems reviewed and are negative.    VITALS:   Last menstrual period 02/16/2015, SpO2 100%.  Wt Readings from Last 3 Encounters:  09/13/23 130 lb (59 kg)  08/16/23 124 lb 9.6 oz (56.5 kg)  07/19/23 132 lb 7.9 oz (60.1 kg)    There is no height or weight on file to calculate BMI.  Performance status (ECOG): 1 - Symptomatic but completely ambulatory  PHYSICAL EXAM:   Physical Exam Vitals and nursing note reviewed. Exam conducted with a chaperone present.  Constitutional:      Appearance: Normal appearance.   Cardiovascular:     Rate and Rhythm: Normal rate and regular rhythm.     Pulses: Normal pulses.     Heart sounds: Normal heart sounds.  Pulmonary:     Effort: Pulmonary effort is normal.     Breath sounds: Normal breath sounds.  Abdominal:     Palpations: Abdomen is soft. There is no hepatomegaly, splenomegaly or mass.     Tenderness: There is no abdominal tenderness.   Musculoskeletal:     Right lower leg: No edema.     Left lower leg: No edema.  Lymphadenopathy:     Cervical: No cervical adenopathy.     Right cervical: No superficial, deep or posterior cervical  adenopathy.    Left cervical: No superficial, deep or posterior cervical adenopathy.     Upper Body:     Right upper body: No supraclavicular or axillary adenopathy.     Left upper body: No supraclavicular or axillary adenopathy.   Neurological:     General: No focal deficit present.     Mental Status: She is alert and oriented to person, place, and time.   Psychiatric:        Mood and Affect: Mood normal.        Behavior: Behavior normal.     LABS:   CBC     Component Value Date/Time   WBC 2.4 (L) 09/13/2023 0852   RBC 3.45 (L) 09/13/2023 0852   HGB 12.2 09/13/2023 0852   HGB 13.5 11/03/2015 0841   HCT 35.9 (L) 09/13/2023 0852   HCT 39.7 11/03/2015 0841   PLT 59 (L) 09/13/2023 0852   PLT 165 11/03/2015 0841  MCV 104.1 (H) 09/13/2023 0852   MCV 95 11/03/2015 0841   MCH 35.4 (H) 09/13/2023 0852   MCHC 34.0 09/13/2023 0852   RDW 15.0 09/13/2023 0852   RDW 13.2 11/03/2015 0841   LYMPHSABS 0.6 (L) 09/13/2023 0852   LYMPHSABS 1.4 11/03/2015 0841   MONOABS 0.2 09/13/2023 0852   EOSABS 0.0 09/13/2023 0852   EOSABS 0.1 11/03/2015 0841   BASOSABS 0.0 09/13/2023 0852   BASOSABS 0.0 11/03/2015 0841    CMP      Component Value Date/Time   NA 138 09/13/2023 0852   NA 140 11/03/2015 0841   K 3.9 09/13/2023 0852   CL 106 09/13/2023 0852   CO2 22 09/13/2023 0852   GLUCOSE 83 09/13/2023 0852   BUN 12 09/13/2023 0852   BUN 10 11/03/2015 0841   CREATININE 0.89 09/13/2023 0852   CALCIUM  8.7 (L) 09/13/2023 0852   PROT 6.8 09/13/2023 0852   PROT 6.9 11/03/2015 0841   ALBUMIN 3.6 09/13/2023 0852   ALBUMIN 4.5 11/03/2015 0841   AST 25 09/13/2023 0852   ALT 12 09/13/2023 0852   ALKPHOS 136 (H) 09/13/2023 0852   BILITOT 2.7 (H) 09/13/2023 0852   BILITOT 2.1 (H) 11/03/2015 0841   GFRNONAA >60 09/13/2023 0852   GFRAA >60 11/21/2019 1348     Lab Results  Component Value Date   CEA1 3.1 08/16/2023   /  CEA  Date Value Ref Range Status  08/16/2023 3.1 0.0 - 4.7 ng/mL  Final    Comment:    (NOTE)                             Nonsmokers          <3.9                             Smokers             <5.6 Roche [employer] Electrochemiluminescence Immunoassay (ECLIA) Values obtained with different assay methods or kits cannot be used interchangeably.  Results cannot be interpreted as absolute evidence of the presence or absence of malignant disease. Performed At: [facility] [address] [place], [place] [id number] Pearlean Botts MD YQ:[id number]    No results found for: PSA1 No results found for: BMW413 No results found for: CAN125  No results found for: Tanner Fanny, A1GS, A2GS, Herbie Loll, MSPIKE, SPEI Lab Results  Component Value Date   TIBC 333 09/07/2022   TIBC 364 05/11/2022   TIBC 385 11/09/2020   FERRITIN 89 09/07/2022   FERRITIN 61 05/11/2022   FERRITIN 54 11/09/2020   IRONPCTSAT 36 (H) 09/07/2022   IRONPCTSAT 26 05/11/2022   IRONPCTSAT 22 11/09/2020   Lab Results  Component Value Date   LDH 154 01/20/2020     STUDIES:   NM PET Image Restag (PS) Skull Base To Thigh Result Date: 09/12/2023 CLINICAL DATA:  Subsequent treatment strategy for rectal cast restaging. Chemotherapy. EXAM: NUCLEAR MEDICINE PET SKULL BASE TO THIGH TECHNIQUE: 6.65 mCi F-18 FDG was injected intravenously. Full-ring PET imaging was performed from the skull base to thigh after the radiotracer. CT data was obtained and used for attenuation correction and anatomic localization. Fasting blood glucose: 80 mg/dl COMPARISON:  PET-CT 24/40/1027. FINDINGS: Mediastinal blood pool activity: SUV max 2.1 Liver activity: SUV max 2.7 NECK: No specific abnormal uptake seen above blood pool abnormal node change the neck including submandibular,  posterior triangle or internal jugular region. Incidental CT findings: Surgical absence of the left thyroid  lobe. The parotid glands and submandibular glands are preserved. Visualized  portions of the paranasal sinuses and mastoid air cells are clear. CHEST: No specific abnormal radiotracer uptake above blood pool in the axillary regions, hilum or mediastinum. Mild soft tissue uptake is nonspecific. Patulous esophagus. Right apical ground-glass ill-defined nodular opacity identified once again. This has some low-level uptake with maximum SUV of 2.1 today and previously 2.6. Area on image 42 measures 2.4 by 1.7 cm today and previously 2.4 by 1.6 cm. Not significantly changed. The more hypermetabolic nodule posteriorly left upper lobe abutting the interlobar fissure is again seen. Transverse dimension previously of 6 mm with maximum SUV of 2.6. Today 7 mm in transverse dimension on image 49 and uptake of maximum SUV of 2.8. Nodule superior segment left lower lobe medially which previously would have measured 8 mm with maximum SUV of 2.4, today has maximum SUV of 5.6 and on image 59 measures 11 by 7 mm. The The more ill-defined nodular area of more inferior in the low left lower lobe is again identified. This nodular opacity has a similar appearance on CT and has low-level uptake of maximum SUV of 2.9. Similar to the right apical area in overall appearance. Incidental CT findings: Breathing motion. No pleural effusion. Left upper chest port in place with tip extending to the central SVC. Once again there is dilatation of the ascending aorta of up to 4.8 cm. Trace pericardial fluid. The heart is nonenlarged. ABDOMEN/PELVIS: There is physiologic distribution radiotracer along the parenchymal organs, bowel and renal collecting systems. There is some mild uptake along the rectum with maximum SUV 5.2 today. Previously 7.4. There are surgical changes in this location. Please correlate for location of neoplasm. No specific abnormal nodal areas of uptake. Incidental CT findings: Slight nodular contour to liver. Distortion along the inferior right hepatic lobe. Stable right adrenal nodule measuring 2.3 cm.  Again no abnormal uptake. Please correlate with previous workup. Scattered colonic stool. Small amount free fluid the pelvis, unchanged. Uterus is present. Prior cholecystectomy. The spleen is enlarged. Longitudinal length on coronal imaging approaches 13.7 cm, similar to previous when adjusted for technique. Low-attenuation areas in the spleen are again noted. No renal or ureteral stones. Scattered mild vascular calcifications. Urinary bladder is underdistended. SKELETON: No specific abnormal uptake along the visualized osseous structures. Incidental CT findings: Changes.  Scattered degenerative IMPRESSION: Decreasing low-level uptake along the rectum with surgical changes. Increased size and uptake of a nodule in the superior segment left lower lobe medially worrisome for a developing lung metastasis. Other areas of lung nodularity with low-level uptake are stable including some ill-defined areas in the left lower lobe and right lung apex and more defined area in the left upper lobe. Contour deformity of the liver without abnormal uptake. Splenomegaly again seen. Small amount of free fluid in the pelvis. Dilatation of the ascending thoracic aorta of 4.8 cm. Similar to previous. Ascending thoracic aortic aneurysm. Recommend semi-annual imaging followup by CTA or MRA and referral to cardiothoracic surgery if not already obtained. This recommendation follows 2010 ACCF/AHA/AATS/ACR/ASA/SCA/SCAI/SIR/STS/SVM Guidelines for the Diagnosis and Management of Patients With Thoracic Aortic Disease. Circulation. 2010; 121: U981-X914. Aortic aneurysm NOS (ICD10-I71.9) Stable right adrenal nodule without abnormal uptake. Electronically Signed   By: Adrianna Horde M.D.   On: 09/12/2023 12:35

## 2023-09-13 ENCOUNTER — Encounter: Payer: Self-pay | Admitting: Hematology

## 2023-09-13 ENCOUNTER — Inpatient Hospital Stay

## 2023-09-13 ENCOUNTER — Inpatient Hospital Stay: Attending: Hematology | Admitting: Hematology

## 2023-09-13 VITALS — BP 111/78 | HR 88 | Resp 18

## 2023-09-13 DIAGNOSIS — Z9221 Personal history of antineoplastic chemotherapy: Secondary | ICD-10-CM | POA: Diagnosis not present

## 2023-09-13 DIAGNOSIS — C787 Secondary malignant neoplasm of liver and intrahepatic bile duct: Secondary | ICD-10-CM | POA: Insufficient documentation

## 2023-09-13 DIAGNOSIS — Z85850 Personal history of malignant neoplasm of thyroid: Secondary | ICD-10-CM | POA: Diagnosis not present

## 2023-09-13 DIAGNOSIS — C7931 Secondary malignant neoplasm of brain: Secondary | ICD-10-CM | POA: Insufficient documentation

## 2023-09-13 DIAGNOSIS — E785 Hyperlipidemia, unspecified: Secondary | ICD-10-CM | POA: Diagnosis not present

## 2023-09-13 DIAGNOSIS — Z79890 Hormone replacement therapy (postmenopausal): Secondary | ICD-10-CM | POA: Diagnosis not present

## 2023-09-13 DIAGNOSIS — Z5111 Encounter for antineoplastic chemotherapy: Secondary | ICD-10-CM | POA: Diagnosis present

## 2023-09-13 DIAGNOSIS — C20 Malignant neoplasm of rectum: Secondary | ICD-10-CM | POA: Insufficient documentation

## 2023-09-13 DIAGNOSIS — E876 Hypokalemia: Secondary | ICD-10-CM | POA: Diagnosis not present

## 2023-09-13 DIAGNOSIS — Z9049 Acquired absence of other specified parts of digestive tract: Secondary | ICD-10-CM | POA: Diagnosis not present

## 2023-09-13 DIAGNOSIS — Z801 Family history of malignant neoplasm of trachea, bronchus and lung: Secondary | ICD-10-CM | POA: Insufficient documentation

## 2023-09-13 DIAGNOSIS — Z5112 Encounter for antineoplastic immunotherapy: Secondary | ICD-10-CM | POA: Diagnosis present

## 2023-09-13 DIAGNOSIS — R161 Splenomegaly, not elsewhere classified: Secondary | ICD-10-CM | POA: Insufficient documentation

## 2023-09-13 DIAGNOSIS — R911 Solitary pulmonary nodule: Secondary | ICD-10-CM | POA: Diagnosis not present

## 2023-09-13 DIAGNOSIS — Z79899 Other long term (current) drug therapy: Secondary | ICD-10-CM | POA: Insufficient documentation

## 2023-09-13 DIAGNOSIS — Z808 Family history of malignant neoplasm of other organs or systems: Secondary | ICD-10-CM | POA: Insufficient documentation

## 2023-09-13 DIAGNOSIS — Z885 Allergy status to narcotic agent status: Secondary | ICD-10-CM | POA: Insufficient documentation

## 2023-09-13 DIAGNOSIS — I7121 Aneurysm of the ascending aorta, without rupture: Secondary | ICD-10-CM | POA: Insufficient documentation

## 2023-09-13 DIAGNOSIS — E063 Autoimmune thyroiditis: Secondary | ICD-10-CM | POA: Diagnosis not present

## 2023-09-13 DIAGNOSIS — Z8249 Family history of ischemic heart disease and other diseases of the circulatory system: Secondary | ICD-10-CM | POA: Diagnosis not present

## 2023-09-13 DIAGNOSIS — Z8042 Family history of malignant neoplasm of prostate: Secondary | ICD-10-CM | POA: Diagnosis not present

## 2023-09-13 DIAGNOSIS — D72819 Decreased white blood cell count, unspecified: Secondary | ICD-10-CM | POA: Diagnosis not present

## 2023-09-13 DIAGNOSIS — D696 Thrombocytopenia, unspecified: Secondary | ICD-10-CM | POA: Diagnosis not present

## 2023-09-13 DIAGNOSIS — Z880 Allergy status to penicillin: Secondary | ICD-10-CM | POA: Diagnosis not present

## 2023-09-13 DIAGNOSIS — Z87891 Personal history of nicotine dependence: Secondary | ICD-10-CM | POA: Insufficient documentation

## 2023-09-13 DIAGNOSIS — R197 Diarrhea, unspecified: Secondary | ICD-10-CM | POA: Insufficient documentation

## 2023-09-13 DIAGNOSIS — Z923 Personal history of irradiation: Secondary | ICD-10-CM | POA: Insufficient documentation

## 2023-09-13 DIAGNOSIS — Z881 Allergy status to other antibiotic agents status: Secondary | ICD-10-CM | POA: Insufficient documentation

## 2023-09-13 LAB — URINALYSIS, COMPLETE (UACMP) WITH MICROSCOPIC
Bacteria, UA: NONE SEEN
Bilirubin Urine: NEGATIVE
Glucose, UA: NEGATIVE mg/dL
Hgb urine dipstick: NEGATIVE
Ketones, ur: NEGATIVE mg/dL
Nitrite: NEGATIVE
Protein, ur: NEGATIVE mg/dL
Specific Gravity, Urine: 1.009 (ref 1.005–1.030)
pH: 6 (ref 5.0–8.0)

## 2023-09-13 LAB — COMPREHENSIVE METABOLIC PANEL WITH GFR
ALT: 12 U/L (ref 0–44)
AST: 25 U/L (ref 15–41)
Albumin: 3.6 g/dL (ref 3.5–5.0)
Alkaline Phosphatase: 136 U/L — ABNORMAL HIGH (ref 38–126)
Anion gap: 10 (ref 5–15)
BUN: 12 mg/dL (ref 6–20)
CO2: 22 mmol/L (ref 22–32)
Calcium: 8.7 mg/dL — ABNORMAL LOW (ref 8.9–10.3)
Chloride: 106 mmol/L (ref 98–111)
Creatinine, Ser: 0.89 mg/dL (ref 0.44–1.00)
GFR, Estimated: >60 mL/min (ref >60)
Glucose, Bld: 83 mg/dL (ref 70–99)
Potassium: 3.9 mmol/L (ref 3.5–5.1)
Sodium: 138 mmol/L (ref 135–145)
Total Bilirubin: 2.7 mg/dL — ABNORMAL HIGH (ref 0.0–1.2)
Total Protein: 6.8 g/dL (ref 6.5–8.1)

## 2023-09-13 LAB — CBC WITH DIFFERENTIAL/PLATELET
Abs Immature Granulocytes: 0.01 10*3/uL (ref 0.00–0.07)
Basophils Absolute: 0 10*3/uL (ref 0.0–0.1)
Basophils Relative: 0 %
Eosinophils Absolute: 0 10*3/uL (ref 0.0–0.5)
Eosinophils Relative: 1 %
HCT: 35.9 % — ABNORMAL LOW (ref 36.0–46.0)
Hemoglobin: 12.2 g/dL (ref 12.0–15.0)
Immature Granulocytes: 0 %
Lymphocytes Relative: 23 %
Lymphs Abs: 0.6 10*3/uL — ABNORMAL LOW (ref 0.7–4.0)
MCH: 35.4 pg — ABNORMAL HIGH (ref 26.0–34.0)
MCHC: 34 g/dL (ref 30.0–36.0)
MCV: 104.1 fL — ABNORMAL HIGH (ref 80.0–100.0)
Monocytes Absolute: 0.2 10*3/uL (ref 0.1–1.0)
Monocytes Relative: 8 %
Neutro Abs: 1.6 10*3/uL — ABNORMAL LOW (ref 1.7–7.7)
Neutrophils Relative %: 68 %
Platelets: 59 10*3/uL — ABNORMAL LOW (ref 150–400)
RBC: 3.45 MIL/uL — ABNORMAL LOW (ref 3.87–5.11)
RDW: 15 % (ref 11.5–15.5)
WBC: 2.4 10*3/uL — ABNORMAL LOW (ref 4.0–10.5)
nRBC: 0 % (ref 0.0–0.2)

## 2023-09-13 LAB — MAGNESIUM: Magnesium: 2.2 mg/dL (ref 1.7–2.4)

## 2023-09-13 LAB — URINALYSIS, DIPSTICK ONLY
Bilirubin Urine: NEGATIVE
Glucose, UA: NEGATIVE mg/dL
Hgb urine dipstick: NEGATIVE
Ketones, ur: NEGATIVE mg/dL
Nitrite: NEGATIVE
Protein, ur: NEGATIVE mg/dL
Specific Gravity, Urine: 1.009 (ref 1.005–1.030)
pH: 6 (ref 5.0–8.0)

## 2023-09-13 MED ORDER — FLUOROURACIL CHEMO INJECTION 500 MG/10ML
320.0000 mg/m2 | Freq: Once | INTRAVENOUS | Status: AC
  Administered 2023-09-13: 500 mg via INTRAVENOUS
  Filled 2023-09-13: qty 10

## 2023-09-13 MED ORDER — PALONOSETRON HCL INJECTION 0.25 MG/5ML
0.2500 mg | Freq: Once | INTRAVENOUS | Status: AC
  Administered 2023-09-13: 0.25 mg via INTRAVENOUS
  Filled 2023-09-13: qty 5

## 2023-09-13 MED ORDER — FAMOTIDINE IN NACL 20-0.9 MG/50ML-% IV SOLN
20.0000 mg | Freq: Once | INTRAVENOUS | Status: AC
  Administered 2023-09-13: 20 mg via INTRAVENOUS
  Filled 2023-09-13: qty 50

## 2023-09-13 MED ORDER — DEXAMETHASONE SODIUM PHOSPHATE 10 MG/ML IJ SOLN
10.0000 mg | Freq: Once | INTRAMUSCULAR | Status: AC
  Administered 2023-09-13: 10 mg via INTRAVENOUS
  Filled 2023-09-13: qty 1

## 2023-09-13 MED ORDER — SODIUM CHLORIDE 0.9 % IV SOLN
5.0000 mg/kg | Freq: Once | INTRAVENOUS | Status: AC
  Administered 2023-09-13: 300 mg via INTRAVENOUS
  Filled 2023-09-13: qty 12

## 2023-09-13 MED ORDER — SODIUM CHLORIDE 0.9 % IV SOLN: Freq: Once | INTRAVENOUS | Status: AC

## 2023-09-13 MED ORDER — SODIUM CHLORIDE 0.9 % IV SOLN
1920.0000 mg/m2 | INTRAVENOUS | Status: DC
  Administered 2023-09-13: 3500 mg via INTRAVENOUS
  Filled 2023-09-13: qty 70

## 2023-09-13 MED ORDER — SODIUM CHLORIDE 0.9 % IV SOLN
320.0000 mg/m2 | Freq: Once | INTRAVENOUS | Status: AC
  Administered 2023-09-13: 540 mg via INTRAVENOUS
  Filled 2023-09-13: qty 27

## 2023-09-13 MED ORDER — SODIUM CHLORIDE 0.9% FLUSH
10.0000 mL | Freq: Once | INTRAVENOUS | Status: AC
  Administered 2023-09-13: 10 mL via INTRAVENOUS

## 2023-09-13 NOTE — Progress Notes (Signed)
 Patient presents today for chemotherapy infusion. Patient is in satisfactory condition with no new complaints voiced.  HR elevated at arrival.  MD aware.  HR was 96 at recheck.  All other vital signs are stable.  Labs reviewed by Dr. Cheree Cords during the office visit   Bilirubin today is 2.7 and platelets are 59.  MD aware.  All other labs are within treatment parameters.  Urine protein is negative.  We will proceed with treatment per MD orders.   Patient tolerated treatment well with no complaints voiced.  Home infusion 5FU pump connected.  Patient left ambulatory with husband in stable condition.  Vital signs stable at discharge.  Follow up as scheduled.

## 2023-09-13 NOTE — Progress Notes (Signed)
 Patient has been examined by Dr. Cheree Cords. Vital signs and labs have been reviewed by MD - ANC, Creatinine, LFTs (total bilirubin 2.7), hemoglobin, and platelets (59) are within treatment parameters per M.D. - pt may proceed with treatment.  Primary RN and pharmacy notified.

## 2023-09-13 NOTE — Patient Instructions (Signed)
 [facility] [place] - A DEPT OF [facility]  Discharge Instructions: Thank you for choosing [facility] to provide your oncology and hematology care.  If you have a lab appointment with the Cancer Center - please note that after April 8th, 2024, all labs will be drawn in the cancer center.  You do not have to check in or register with the main entrance as you have in the past but will complete your check-in in the cancer center.  Wear comfortable clothing and clothing appropriate for easy access to any Portacath or PICC line.   We strive to give you quality time with your provider. You may need to reschedule your appointment if you arrive late (15 or more minutes).  Arriving late affects you and other patients whose appointments are after yours.  Also, if you miss three or more appointments without notifying the office, you may be dismissed from the clinic at the provider's discretion.      For prescription refill requests, have your pharmacy contact our office and allow 72 hours for refills to be completed.    Today you received the following chemotherapy and/or immunotherapy agents MVASI/Leucovorin/5FU.  Bevacizumab Injection What is this medication? BEVACIZUMAB (be va SIZ yoo mab) treats some types of cancer. It works by blocking a protein that causes cancer cells to grow and multiply. This helps to slow or stop the spread of cancer cells. It is a monoclonal antibody. This medicine may be used for other purposes; ask your health care provider or pharmacist if you have questions. COMMON BRAND NAME(S): Alymsys, Avastin, MVASI, [place] Lao What should I tell my care team before I take this medication? They need to know if you have any of these conditions: Blood clots Coughing up blood Having or recent surgery Heart failure High blood pressure History of a connection between 2 or more body parts that do not usually connect (fistula) History of a  tear in your stomach or intestines Protein in your urine An unusual or allergic reaction to bevacizumab, other medications, foods, dyes, or preservatives Pregnant or trying to get pregnant Breast-feeding How should I use this medication? This medication is injected into a vein. It is given by your care team in a hospital or clinic setting. Talk to your care team the use of this medication in children. Special care may be needed. Overdosage: If you think you have taken too much of this medicine contact a poison control center or emergency room at once. NOTE: This medicine is only for you. Do not share this medicine with others. What if I miss a dose? Keep appointments for follow-up doses. It is important not to miss your dose. Call your care team if you are unable to keep an appointment. What may interact with this medication? Interactions are not expected. This list may not describe all possible interactions. Give your health care provider a list of all the medicines, herbs, non-prescription drugs, or dietary supplements you use. Also tell them if you smoke, drink alcohol, or use illegal drugs. Some items may interact with your medicine. What should I watch for while using this medication? Your condition will be monitored carefully while you are receiving this medication. You may need blood work while taking this medication. This medication may make you feel generally unwell. This is not uncommon as chemotherapy can affect healthy cells as well as cancer cells. Report any side effects. Continue your course of treatment even though you feel  ill unless your care team tells you to stop. This medication may increase your risk to bruise or bleed. Call your care team if you notice any unusual bleeding. Before having surgery, talk to your care team to make sure it is ok. This medication can increase the risk of poor healing of your surgical site or wound. You will need to stop this medication for 28 days  before surgery. After surgery, wait at least 28 days before restarting this medication. Make sure the surgical site or wound is healed enough before restarting this medication. Talk to your care team if questions. Talk to your care team if you may be pregnant. Serious birth defects can occur if you take this medication during pregnancy and for 6 months after the last dose. Contraception is recommended while taking this medication and for 6 months after the last dose. Your care team can help you find the option that works for you. Do not breastfeed while taking this medication and for 6 months after the last dose. This medication can cause infertility. Talk to your care team if you are concerned about your fertility. What side effects may I notice from receiving this medication? Side effects that you should report to your care team as soon as possible: Allergic reactions--skin rash, itching, hives, swelling of the face, lips, tongue, or throat Bleeding--bloody or black, tar-like stools, vomiting blood or [place] material that looks like coffee grounds, red or dark [place] urine, small red or purple spots on skin, unusual bruising or bleeding Blood clot--pain, swelling, or warmth in the leg, shortness of breath, chest pain Heart attack--pain or tightness in the chest, shoulders, arms, or jaw, nausea, shortness of breath, cold or clammy skin, feeling faint or lightheaded Heart failure--shortness of breath, swelling of the ankles, feet, or hands, sudden weight gain, unusual weakness or fatigue Increase in blood pressure Infection--fever, chills, cough, sore throat, wounds that don't heal, pain or trouble when passing urine, general feeling of discomfort or being unwell Infusion reactions--chest pain, shortness of breath or trouble breathing, feeling faint or lightheaded Kidney injury--decrease in the amount of urine, swelling of the ankles, hands, or feet Stomach pain that is severe, does not go away, or gets  worse Stroke--sudden numbness or weakness of the face, arm, or leg, trouble speaking, confusion, trouble walking, loss of balance or coordination, dizziness, severe headache, change in vision Sudden and severe headache, confusion, change in vision, seizures, which may be signs of posterior reversible encephalopathy syndrome (PRES) Side effects that usually do not require medical attention (report to your care team if they continue or are bothersome): Back pain Change in taste Diarrhea Dry skin Increased tears Nosebleed This list may not describe all possible side effects. Call your doctor for medical advice about side effects. You may report side effects to FDA at 1-800-FDA-1088. Where should I keep my medication? This medication is given in a hospital or clinic. It will not be stored at home. NOTE: This sheet is a summary. It may not cover all possible information. If you have questions about this medicine, talk to your doctor, pharmacist, or health care provider.  2024 Elsevier/Gold Standard (2021-07-30 00:00:00)   Leucovorin Injection What is this medication? LEUCOVORIN (loo koe VOR in) prevents side effects from certain medications, such as methotrexate. It works by increasing folate levels. This helps protect healthy cells in your body. It may also be used to treat anemia caused by low levels of folate. It can also be used with fluorouracil, a type  of chemotherapy, to treat colorectal cancer. It works by increasing the effects of fluorouracil in the body. This medicine may be used for other purposes; ask your health care provider or pharmacist if you have questions. What should I tell my care team before I take this medication? They need to know if you have any of these conditions: Anemia from low levels of vitamin B12 in the blood An unusual or allergic reaction to leucovorin, folic acid, other medications, foods, dyes, or preservatives Pregnant or trying to get  pregnant Breastfeeding How should I use this medication? This medication is injected into a vein or a muscle. It is given by your care team in a hospital or clinic setting. Talk to your care team about the use of this medication in children. Special care may be needed. Overdosage: If you think you have taken too much of this medicine contact a poison control center or emergency room at once. NOTE: This medicine is only for you. Do not share this medicine with others. What if I miss a dose? Keep appointments for follow-up doses. It is important not to miss your dose. Call your care team if you are unable to keep an appointment. What may interact with this medication? Capecitabine Fluorouracil Phenobarbital Phenytoin Primidone Trimethoprim;sulfamethoxazole This list may not describe all possible interactions. Give your health care provider a list of all the medicines, herbs, non-prescription drugs, or dietary supplements you use. Also tell them if you smoke, drink alcohol, or use illegal drugs. Some items may interact with your medicine. What should I watch for while using this medication? Your condition will be monitored carefully while you are receiving this medication. This medication may increase the side effects of 5-fluorouracil. Tell your care team if you have diarrhea or mouth sores that do not get better or that get worse. What side effects may I notice from receiving this medication? Side effects that you should report to your care team as soon as possible: Allergic reactions--skin rash, itching, hives, swelling of the face, lips, tongue, or throat This list may not describe all possible side effects. Call your doctor for medical advice about side effects. You may report side effects to FDA at 1-800-FDA-1088. Where should I keep my medication? This medication is given in a hospital or clinic. It will not be stored at home. NOTE: This sheet is a summary. It may not cover all possible  information. If you have questions about this medicine, talk to your doctor, pharmacist, or health care provider.  2024 Elsevier/Gold Standard (2021-08-17 00:00:00)   Fluorouracil Injection What is this medication? FLUOROURACIL (flure oh YOOR a sil) treats some types of cancer. It works by slowing down the growth of cancer cells. This medicine may be used for other purposes; ask your health care provider or pharmacist if you have questions. COMMON BRAND NAME(S): Adrucil What should I tell my care team before I take this medication? They need to know if you have any of these conditions: Blood disorders Dihydropyrimidine dehydrogenase (DPD) deficiency Infection, such as chickenpox, cold sores, herpes Kidney disease Liver disease Poor nutrition Recent or ongoing radiation therapy An unusual or allergic reaction to fluorouracil, other medications, foods, dyes, or preservatives If you or your partner are pregnant or trying to get pregnant Breast-feeding How should I use this medication? This medication is injected into a vein. It is administered by your care team in a hospital or clinic setting. Talk to your care team about the use of this medication in children.  Special care may be needed. Overdosage: If you think you have taken too much of this medicine contact a poison control center or emergency room at once. NOTE: This medicine is only for you. Do not share this medicine with others. What if I miss a dose? Keep appointments for follow-up doses. It is important not to miss your dose. Call your care team if you are unable to keep an appointment. What may interact with this medication? Do not take this medication with any of the following: Live virus vaccines This medication may also interact with the following: Medications that treat or prevent blood clots, such as warfarin, enoxaparin, dalteparin This list may not describe all possible interactions. Give your health care provider a  list of all the medicines, herbs, non-prescription drugs, or dietary supplements you use. Also tell them if you smoke, drink alcohol, or use illegal drugs. Some items may interact with your medicine. What should I watch for while using this medication? Your condition will be monitored carefully while you are receiving this medication. This medication may make you feel generally unwell. This is not uncommon as chemotherapy can affect healthy cells as well as cancer cells. Report any side effects. Continue your course of treatment even though you feel ill unless your care team tells you to stop. In some cases, you may be given additional medications to help with side effects. Follow all directions for their use. This medication may increase your risk of getting an infection. Call your care team for advice if you get a fever, chills, sore throat, or other symptoms of a cold or flu. Do not treat yourself. Try to avoid being around people who are sick. This medication may increase your risk to bruise or bleed. Call your care team if you notice any unusual bleeding. Be careful brushing or flossing your teeth or using a toothpick because you may get an infection or bleed more easily. If you have any dental work done, tell your dentist you are receiving this medication. Avoid taking medications that contain aspirin, acetaminophen, ibuprofen, naproxen, or ketoprofen unless instructed by your care team. These medications may hide a fever. Do not treat diarrhea with over the counter products. Contact your care team if you have diarrhea that lasts more than 2 days or if it is severe and watery. This medication can make you more sensitive to the sun. Keep out of the sun. If you cannot avoid being in the sun, wear protective clothing and sunscreen. Do not use sun lamps, tanning beds, or tanning booths. Talk to your care team if you or your partner wish to become pregnant or think you might be pregnant. This medication  can cause serious birth defects if taken during pregnancy and for 3 months after the last dose. A reliable form of contraception is recommended while taking this medication and for 3 months after the last dose. Talk to your care team about effective forms of contraception. Do not father a child while taking this medication and for 3 months after the last dose. Use a condom while having sex during this time period. Do not breastfeed while taking this medication. This medication may cause infertility. Talk to your care team if you are concerned about your fertility. What side effects may I notice from receiving this medication? Side effects that you should report to your care team as soon as possible: Allergic reactions--skin rash, itching, hives, swelling of the face, lips, tongue, or throat Heart attack--pain or tightness in the chest,  shoulders, arms, or jaw, nausea, shortness of breath, cold or clammy skin, feeling faint or lightheaded Heart failure--shortness of breath, swelling of the ankles, feet, or hands, sudden weight gain, unusual weakness or fatigue Heart rhythm changes--fast or irregular heartbeat, dizziness, feeling faint or lightheaded, chest pain, trouble breathing High ammonia level--unusual weakness or fatigue, confusion, loss of appetite, nausea, vomiting, seizures Infection--fever, chills, cough, sore throat, wounds that don't heal, pain or trouble when passing urine, general feeling of discomfort or being unwell Low red blood cell level--unusual weakness or fatigue, dizziness, headache, trouble breathing Pain, tingling, or numbness in the hands or feet, muscle weakness, change in vision, confusion or trouble speaking, loss of balance or coordination, trouble walking, seizures Redness, swelling, and blistering of the skin over hands and feet Severe or prolonged diarrhea Unusual bruising or bleeding Side effects that usually do not require medical attention (report to your care team  if they continue or are bothersome): Dry skin Headache Increased tears Nausea Pain, redness, or swelling with sores inside the mouth or throat Sensitivity to light Vomiting This list may not describe all possible side effects. Call your doctor for medical advice about side effects. You may report side effects to FDA at 1-800-FDA-1088. Where should I keep my medication? This medication is given in a hospital or clinic. It will not be stored at home. NOTE: This sheet is a summary. It may not cover all possible information. If you have questions about this medicine, talk to your doctor, pharmacist, or health care provider.  2024 Elsevier/Gold Standard (2021-07-20 00:00:00)       To help prevent nausea and vomiting after your treatment, we encourage you to take your nausea medication as directed.  BELOW ARE SYMPTOMS THAT SHOULD BE REPORTED IMMEDIATELY: *FEVER GREATER THAN 100.4 F (38 C) OR HIGHER *CHILLS OR SWEATING *NAUSEA AND VOMITING THAT IS NOT CONTROLLED WITH YOUR NAUSEA MEDICATION *UNUSUAL SHORTNESS OF BREATH *UNUSUAL BRUISING OR BLEEDING *URINARY PROBLEMS (pain or burning when urinating, or frequent urination) *BOWEL PROBLEMS (unusual diarrhea, constipation, pain near the anus) TENDERNESS IN MOUTH AND THROAT WITH OR WITHOUT PRESENCE OF ULCERS (sore throat, sores in mouth, or a toothache) UNUSUAL RASH, SWELLING OR PAIN  UNUSUAL VAGINAL DISCHARGE OR ITCHING   Items with * indicate a potential emergency and should be followed up as soon as possible or go to the Emergency Department if any problems should occur.  Please show the CHEMOTHERAPY ALERT CARD or IMMUNOTHERAPY ALERT CARD at check-in to the Emergency Department and triage nurse.  Should you have questions after your visit or need to cancel or reschedule your appointment, please contact [facility] [place] - A DEPT OF [facility] [phone]  and follow the prompts.  Office hours are 8:00 a.m. to  4:30 p.m. Monday - Friday. Please note that voicemails left after 4:00 p.m. may not be returned until the following business day.  We are closed weekends and major holidays. You have access to a nurse at all times for urgent questions. Please call the main number to the clinic [phone] and follow the prompts.  For any non-urgent questions, you may also contact your provider using MyChart. We now offer e-Visits for anyone 19 and older to request care online for non-urgent symptoms. For details visit [email or web address].   Also download the MyChart app! Go to the app store, search "MyChart", open the app, select [place], and log in with your MyChart username and password.

## 2023-09-13 NOTE — Patient Instructions (Addendum)
 [facility] at [facility] Discharge Instructions   You were seen and examined today by Dr. Cheree Cords.  He reviewed the results of your lab work which are normal/stable.  He reviewed the results of your PET scan. There is one spot on the lung that has grown slightly in size and is slightly brighter when compared to the previous scan. All other areas are stable. We will check on this spot in 3 months. If it is continuing to grow, we will refer you for radiation to this area.    We will proceed with your treatment today.   Return as scheduled.    Thank you for choosing [facility] at [facility] to provide your oncology and hematology care.  To afford each patient quality time with our provider, please arrive at least 15 minutes before your scheduled appointment time.   If you have a lab appointment with the Cancer Center please come in thru the Main Entrance and check in at the main information desk.  You need to re-schedule your appointment should you arrive 10 or more minutes late.  We strive to give you quality time with our providers, and arriving late affects you and other patients whose appointments are after yours.  Also, if you no show three or more times for appointments you may be dismissed from the clinic at the providers discretion.     Again, thank you for choosing [facility].  Our hope is that these requests will decrease the amount of time that you wait before being seen by our physicians.       _____________________________________________________________  Should you have questions after your visit to [facility], please contact our office at [phone] and follow the prompts.  Our office hours are 8:00 a.m. and 4:30 p.m. Monday - Friday.  Please note that voicemails left after 4:00 p.m. may not be returned until the following business day.  We are closed weekends and major holidays.  You do have access  to a nurse 24-7, just call the main number to the clinic [phone] and do not press any options, hold on the line and a nurse will answer the phone.    For prescription refill requests, have your pharmacy contact our office and allow 72 hours.    Due to Covid, you will need to wear a mask upon entering the hospital. If you do not have a mask, a mask will be given to you at the Main Entrance upon arrival. For doctor visits, patients may have 1 support person age 84 or older with them. For treatment visits, patients can not have anyone with them due to social distancing guidelines and our immunocompromised population.

## 2023-09-14 LAB — CEA: CEA: 4.3 ng/mL (ref 0.0–4.7)

## 2023-09-15 ENCOUNTER — Inpatient Hospital Stay

## 2023-09-15 VITALS — BP 122/78 | HR 95 | Temp 97.5°F | Resp 18

## 2023-09-15 DIAGNOSIS — Z5111 Encounter for antineoplastic chemotherapy: Secondary | ICD-10-CM | POA: Diagnosis not present

## 2023-09-15 DIAGNOSIS — C20 Malignant neoplasm of rectum: Secondary | ICD-10-CM

## 2023-09-15 MED ORDER — HEPARIN SOD (PORK) LOCK FLUSH 100 UNIT/ML IV SOLN
500.0000 [IU] | Freq: Once | INTRAVENOUS | Status: AC | PRN
  Administered 2023-09-15: 500 [IU]

## 2023-09-15 MED ORDER — SODIUM CHLORIDE 0.9% FLUSH
10.0000 mL | INTRAVENOUS | Status: DC | PRN
  Administered 2023-09-15: 10 mL

## 2023-09-15 NOTE — Progress Notes (Signed)
 Patient presents today for 5FU pump stop and disconnection after 46 hour continous infusion.   5FU pump deaccessed.  Patients port flushed without difficulty.  Good blood return noted with no bruising or swelling noted at site.  Needle removed intact.  Band aid applied.  VSS with discharge and left in satisfactory condition via wheelchair with no s/s of distress noted.

## 2023-09-15 NOTE — Patient Instructions (Signed)
 [facility] [place] - A DEPT OF [facility]  Discharge Instructions: Thank you for choosing [facility] to provide your oncology and hematology care.  If you have a lab appointment with the Cancer Center - please note that after April 8th, 2024, all labs will be drawn in the cancer center.  You do not have to check in or register with the main entrance as you have in the past but will complete your check-in in the cancer center.  Wear comfortable clothing and clothing appropriate for easy access to any Portacath or PICC line.   We strive to give you quality time with your provider. You may need to reschedule your appointment if you arrive late (15 or more minutes).  Arriving late affects you and other patients whose appointments are after yours.  Also, if you miss three or more appointments without notifying the office, you may be dismissed from the clinic at the provider's discretion.      For prescription refill requests, have your pharmacy contact our office and allow 72 hours for refills to be completed.    Today you received the following chemotherapy and/or immunotherapy agents pump stop      To help prevent nausea and vomiting after your treatment, we encourage you to take your nausea medication as directed.  BELOW ARE SYMPTOMS THAT SHOULD BE REPORTED IMMEDIATELY: *FEVER GREATER THAN 100.4 F (38 C) OR HIGHER *CHILLS OR SWEATING *NAUSEA AND VOMITING THAT IS NOT CONTROLLED WITH YOUR NAUSEA MEDICATION *UNUSUAL SHORTNESS OF BREATH *UNUSUAL BRUISING OR BLEEDING *URINARY PROBLEMS (pain or burning when urinating, or frequent urination) *BOWEL PROBLEMS (unusual diarrhea, constipation, pain near the anus) TENDERNESS IN MOUTH AND THROAT WITH OR WITHOUT PRESENCE OF ULCERS (sore throat, sores in mouth, or a toothache) UNUSUAL RASH, SWELLING OR PAIN  UNUSUAL VAGINAL DISCHARGE OR ITCHING   Items with * indicate a potential emergency and should be followed up  as soon as possible or go to the Emergency Department if any problems should occur.  Please show the CHEMOTHERAPY ALERT CARD or IMMUNOTHERAPY ALERT CARD at check-in to the Emergency Department and triage nurse.  Should you have questions after your visit or need to cancel or reschedule your appointment, please contact [facility] [place] - A DEPT OF [facility] [phone]  and follow the prompts.  Office hours are 8:00 a.m. to 4:30 p.m. Monday - Friday. Please note that voicemails left after 4:00 p.m. may not be returned until the following business day.  We are closed weekends and major holidays. You have access to a nurse at all times for urgent questions. Please call the main number to the clinic [phone] and follow the prompts.  For any non-urgent questions, you may also contact your provider using MyChart. We now offer e-Visits for anyone 60 and older to request care online for non-urgent symptoms. For details visit [email or web address].   Also download the MyChart app! Go to the app store, search "MyChart", open the app, select [place], and log in with your MyChart username and password.

## 2023-09-21 ENCOUNTER — Other Ambulatory Visit: Payer: Self-pay

## 2023-10-11 ENCOUNTER — Inpatient Hospital Stay: Admitting: Hematology

## 2023-10-11 ENCOUNTER — Inpatient Hospital Stay

## 2023-10-13 ENCOUNTER — Inpatient Hospital Stay

## 2023-10-17 NOTE — Progress Notes (Signed)
 [facility] [address] [place] [address]    Clinic Day:  10/18/2023  Referring physician: Alphonsa Glendia LABOR, MD  Patient Care Team: Alphonsa Glendia LABOR, MD as PCP - General (Family Medicine) Rogers Hai, MD as Medical Oncologist (Medical Oncology)   ASSESSMENT & PLAN:   Assessment: 1.  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  Liver biopsy on 03/09/2018 consistent with adenocarcinoma. 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 -7 cycles of FOLFOX with vectibix  completed on 06/27/2018. -XRT with Xeloda  from 07/30/2018 through 09/06/2018. -Right liver lesion microwave ablation on 10/15/2018 at [facility]. -Low anterior resection and diverting ileostomy on 12/07/2018, pathology [id number], 0/6 lymph nodes positive, margins negative. -Loop ileostomy reversal on 04/17/2019. - Microwave ablation of the right hepatic lobe lesion by Dr. Luke around 08/15/2019. -SBRT to the left lower lobe lung lesion and left liver lobe lesion on 12/13/2019 at [facility]. - Right upper lobe lesion SBRT from 08/06/2020 through 08/13/2020. - 3 left lung lesions and subcarinal lymph node IMRT from 09/21/2021 through 10/05/2021. - Liver biopsy (06/09/2022): Metastatic moderately differentiated colonic adenocarcinoma. - NGS: KRAS/NRAS/BRAF wild-type, MSI-stable, HER2-0, negative for other targetable mutations - Cycle 1 of FOLFOX and bevacizumab  on 07/06/2022, oxaliplatin  discontinued from 11/09/2022 (cycle 8), 5-FU and bevacizumab  maintenance changed to every 3 weeks on 01/04/2023, further switched to every 4 weeks on 04/26/2023 for better quality of life. - XRT to the right neck from 07/14/2022 through 08/03/2022    Plan: 1.  Stage IV rectal adenocarcinoma, BRAF/RAS wild-type, MSI-stable: - PET scan (09/07/2023): Increased size and uptake of nodule in  the superior segment left lower lobe worrisome for metastatic disease.  Today this measures 11 x 7 mm, SUV 5.6, previously measured 8 mm with SUV 2.4.  Other areas of lung nodularity with low-level uptake are stable.  Stable splenomegaly.  Dilation of ascending thoracic aorta 4.8 cm stable.  No skeletal metastatic disease. - She is tolerating infusional 5-FU/leucovorin  maintenance every 4 weeks very well. - Last CEA was 4.3 on 09/13/2023.  Labs today shows platelet count 60, white count 2.0 with ANC of 1.5.  Hemoglobin is normal.  LFTs are normal. - She will continue maintenance 5-FU/leucovorin  every 4 weeks.  She is having constipation after each treatment.  She had to have rectal dilatation done previously.  She will reach out to Dr. Eartha for sigmoidoscopy and possible dilatation. - I have recommended CT chest with contrast in September to monitor on the left lower lobe lung nodule.  If it continues to grow with no other new lesions, consider referral to radiation oncology for SBRT.  Consider repeating PET scan in December.   2.  Leukopenia and thrombocytopenia: - She has a leukopenia and thrombocytopenia from combination of chemotherapy and splenomegaly.  Continue close monitoring.   3.  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  She is scheduled for another MRI on 11/01/2023.  4.  Hypokalemia: - Continue liquid potassium 20 mill equivalents daily.  Potassium is 3.5.    No orders of the defined types were placed in this encounter.    Samantha Reynolds,acting as a Neurosurgeon for Hai Rogers, MD.,have documented all relevant documentation on the behalf of Kaimani Clayson, MD,as directed by  Hai Rogers, MD while in the presence of Hai Rogers, MD.  I, Hai Rogers  MD, have reviewed the above documentation for accuracy and completeness, and I agree with the above.     Alean Stands, MD    7/23/202512:54 PM  CHIEF COMPLAINT:   Diagnosis: metastatic rectal cancer    Cancer Staging  Malignant neoplasm of rectum [facility]) Staging form: Colon and Rectum, AJCC 8th Edition - Clinical stage from 03/13/2018: Stage IVA (cT3, cN1b, cM1a) - Signed by Stands Alean, MD on 03/13/2018    Prior Therapy: 1. FOLFOX & vectibix  x 7 cycles from 03/14/2018 to 06/27/2018. 2. XRT with Xeloda  from 07/30/2018 to 09/06/2018. 3. Right liver lesion microwave ablation on 10/15/2018. 4. Low anterior resection and diverting ileostomy on 12/07/2018. 5. Right hepatic lobe microwave ablation on 08/15/19 6. SBRT to LLL and liver 11/15/2019 -12/13/2019. 7. SBRT to RUL 08/06/20 - 08/13/20 8. IMRT to 3 left lung lesions and subcarinal LN 09/21/21 - 10/05/21 9. XRT to C6 07/14/22 - 08/03/22 10. SRS to brain metastasis on 08/31/22 11. FOLFOX and bevacizumab , 8 cycles, 07/06/22 - 11/09/22  Current Therapy:  maintenance 5-FU and bevacizumab    HISTORY OF PRESENT ILLNESS:   Oncology History  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HCC)  02/26/2018 Initial Diagnosis   Rectal cancer (HCC)   03/13/2018 Cancer Staging   Staging form: Colon and Rectum, AJCC 8th Edition - Clinical stage from 03/13/2018: Stage IVA (cT3, cN1b, cM1a) - Signed by Stands Alean, MD on 03/13/2018   03/14/2018 - 06/29/2018 Chemotherapy   The patient had palonosetron  (ALOXI ) injection 0.25 mg, 0.25 mg, Intravenous,  Once, 7 of 8 cycles Administration: 0.25 mg (03/14/2018), 0.25 mg (03/27/2018), 0.25 mg (04/18/2018), 0.25 mg (05/02/2018), 0.25 mg (05/16/2018), 0.25 mg (06/05/2018), 0.25 mg (06/27/2018) leucovorin  800 mg in dextrose  5 % 250 mL infusion, 772 mg, Intravenous,  Once, 7 of 8 cycles Administration: 800 mg (03/14/2018), 800 mg (03/27/2018), 800 mg (05/02/2018), 800 mg (05/16/2018), 700 mg (04/18/2018), 800 mg (06/05/2018), 800 mg (06/27/2018) oxaliplatin  (ELOXATIN ) 165 mg in dextrose  5 % 500 mL chemo infusion, 85 mg/m2 = 165 mg, Intravenous,  Once, 6  of 7 cycles Dose modification: 68 mg/m2 (80 % of original dose 85 mg/m2, Cycle 4, Reason: Provider Judgment) Administration: 165 mg (03/14/2018), 165 mg (03/27/2018), 130 mg (05/02/2018), 130 mg (05/16/2018), 130 mg (06/05/2018), 130 mg (06/27/2018) panitumumab  (VECTIBIX ) 500 mg in sodium chloride  0.9 % 100 mL chemo infusion, 480 mg, Intravenous,  Once, 4 of 5 cycles Administration: 500 mg (04/18/2018), 480 mg (05/02/2018), 480 mg (05/16/2018), 480 mg (06/05/2018) fluorouracil  (ADRUCIL ) chemo injection 750 mg, 400 mg/m2 = 750 mg (100 % of original dose 400 mg/m2), Intravenous,  Once, 6 of 7 cycles Dose modification: 400 mg/m2 (original dose 400 mg/m2, Cycle 1), 400 mg/m2 (original dose 400 mg/m2, Cycle 3) Administration: 750 mg (03/14/2018), 750 mg (04/18/2018), 750 mg (05/02/2018), 750 mg (05/16/2018), 750 mg (06/05/2018), 750 mg (06/27/2018) fosaprepitant  (EMEND) 150 mg, dexamethasone  (DECADRON ) 12 mg in sodium chloride  0.9 % 145 mL IVPB, , Intravenous,  Once, 4 of 5 cycles Administration:  (05/02/2018),  (05/16/2018),  (06/05/2018),  (06/27/2018) fluorouracil  (ADRUCIL ) 4,650 mg in sodium chloride  0.9 % 57 mL chemo infusion, 2,400 mg/m2 = 4,650 mg, Intravenous, 1 Day/Dose, 7 of 8 cycles Administration: 4,650 mg (03/14/2018), 4,650 mg (03/27/2018), 4,650 mg (04/18/2018), 4,650 mg (05/02/2018), 4,650 mg (05/16/2018), 4,650 mg (06/05/2018), 4,650 mg (06/27/2018)  for chemotherapy treatment.    07/06/2022 -  Chemotherapy   Patient is on Treatment Plan : COLORECTAL FOLFOX + Bevacizumab  q14d        INTERVAL HISTORY:  Samantha Reynolds is a 58 y.o. female presenting to clinic today for follow up of metastatic rectal cancer. She was last seen by me on 09/13/2023.  Today, she states that she is doing well overall. Her appetite level is at 70%. Her energy level is at 70%.  PAST MEDICAL HISTORY:   Past Medical History: Past Medical History:  Diagnosis Date   Anxiety    Cancer (HCC) 2013   thyroid    Endometrial polyp    Endometrial  thickening on ultra sound    GERD (gastroesophageal reflux disease)    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   Worse in her 23s when she was ill   H/O Hashimoto thyroiditis    History of chemotherapy    History of radiation therapy    History of thyroid  cancer no recurrence   2013--  s/p  left lobe thyroidectomy--  follicular varient papillary / lymphocyctic thyroiditis   Hyperlipidemia 11/09/2015   Hypothyroidism    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HCC) 02/26/2018    Surgical History: Past Surgical History:  Procedure Laterality Date   BIOPSY  02/19/2018   Procedure: BIOPSY;  Surgeon: Golda Claudis PENNER, MD;  [facility];  Service: Endoscopy;;  rectum   COLONOSCOPY N/A 08/20/2015   Procedure: COLONOSCOPY;  Surgeon: Claudis PENNER Golda, MD;  [facility];  Service: Endoscopy;  Laterality: N/A;  730   COLONOSCOPY WITH PROPOFOL  N/A 07/21/2021   Procedure: COLONOSCOPY WITH PROPOFOL ;  Surgeon: Golda Claudis PENNER, MD;  [facility];  Service: Endoscopy;  Laterality: N/A;  10:10   D & C HYSTEROOSCOPY W/ THERMACHOICE ENDOMETRIAL ABLATION  08-13-2004   DIVERTING ILEOSTOMY N/A 12/07/2018   Procedure: DIVERTING LOOP ILEOSTOMY;  Surgeon: Debby Hila, MD;  Location: [facility];  Service: General;  Laterality: N/A;   FLEXIBLE SIGMOIDOSCOPY N/A 02/19/2018   Procedure: FLEXIBLE SIGMOIDOSCOPY;  Surgeon: Golda Claudis PENNER, MD;  [facility];  Service: Endoscopy;  Laterality: N/A;   HYSTEROSCOPY WITH D & C N/A 04/30/2015   Procedure: DILATATION AND CURETTAGE / INTENDED HYSTEROSCOPY;  Surgeon: Rosaline Cobble, MD;  Location: [facility];  Service: Gynecology;  Laterality: N/A;   ILEOSTOMY CLOSURE N/A 04/17/2019   Procedure: LOOP ILEOSTOMY REVERSAL;  Surgeon: Debby Hila, MD;  Location: [facility];  Service: General;  Laterality: N/A;   IR US  GUIDE BX ASP/DRAIN  03/09/2018   LAPAROSCOPIC CHOLECYSTECTOMY  04/1996   LAPAROSCOPY N/A 12/11/2018   Procedure: LAPAROSCOPY  ILEOSTOMY  REVISION AND ABDOMINAL WASHOUT;  Surgeon: Debby Hila, MD;  Location: [facility];  Service: General;  Laterality: N/A;   Liver Microwave   10/17/2018   POLYPECTOMY  08/20/2015   Procedure: POLYPECTOMY;  Surgeon: Claudis PENNER Golda, MD;  [facility];  Service: Endoscopy;;  Splenic flexure polypectomy   POLYPECTOMY  02/19/2018   Procedure: POLYPECTOMY;  Surgeon: Golda Claudis PENNER, MD;  [facility];  Service: Endoscopy;;  rectum   PORTACATH PLACEMENT N/A 03/14/2018   Procedure: INSERTION PORT-A-CATH;  Surgeon: Mavis Anes, MD;  Location: [facility];  Service: General;  Laterality: N/A;   REDUCTION INCARCERATED UTERUS  06-20-2000   intrauterine preg. 13 wks /  urinary retention   THYROID  LOBECTOMY  11/24/2011   Procedure: THYROID  LOBECTOMY;  Surgeon: Krystal CHRISTELLA Spinner, MD;  Location: [facility];  Service: General;  Laterality: Left;  Left Thyroid  Lobectomy   TUBAL LIGATION  2002   XI ROBOTIC ASSISTED LOWER ANTERIOR RESECTION N/A 12/07/2018   Procedure: XI ROBOTIC ASSISTED LOWER ANTERIOR RESECTION, SPENIC  FLEXURE IMMOBILIZATION, COLOANAL ANASTOMOSIS, RIGID PROCTOSCOPY;  Surgeon: Debby Hila, MD;  Location: [facility];  Service: General;  Laterality: N/A;    Social History: Social History   Socioeconomic History   Marital status: Married    Spouse name: Not on file   Number of children: 4   Years of education: Not on file   Highest education level: Not on file  Occupational History    Comment: Systems developer at [employer]  Tobacco Use   Smoking status: Former    Current packs/day: 0.00    Average packs/day: 0.3 packs/day for 10.0 years (2.5 ttl pk-yrs)    Types: Cigarettes    Start date: 10/30/1981    Quit date: 10/31/1991    Years since quitting: 31.9   Smokeless tobacco: Never  Vaping Use   Vaping status: Never Used  Substance and Sexual Activity   Alcohol use: No   Drug use: No   Sexual activity: Not Currently  Other Topics Concern   Not on file  Social History Narrative    Lives with husband Oneil and 67 year old son Cyndee   Husband is Technical brewer of [employer]   Social Drivers of Health   Financial Resource Strain: Low Risk  (02/26/2018)   Overall Financial Resource Strain (CARDIA)    Difficulty of Paying Living Expenses: Not hard at all  Food Insecurity: No Food Insecurity (06/30/2023)   Hunger Vital Sign    Worried About Running Out of Food in the Last Year: Never true    Ran Out of Food in the Last Year: Never true  Transportation Needs: No Transportation Needs (06/30/2023)   PRAPARE - Administrator, Civil Service (Medical): No    Lack of Transportation (Non-Medical): No  Physical Activity: Inactive (02/26/2018)   Exercise Vital Sign    Days of Exercise per Week: 0 days    Minutes of Exercise per Session: 0 min  Stress: Stress Concern Present (02/26/2018)   [employer] of Occupational Health - Occupational Stress Questionnaire    Feeling of Stress : To some extent  Social Connections: Socially Integrated (02/26/2018)   Social Connection and Isolation Panel    Frequency of Communication with Friends and Family: More than three times a week    Frequency of Social Gatherings with Friends and Family: Twice a week    Attends Religious Services: More than 4 times per year    Active Member of [employer] or Organizations: Yes    Attends Engineer, structural: More than 4 times per year    Marital Status: Married  Catering manager Violence: Not At Risk (06/30/2023)   Humiliation, Afraid, Rape, and Kick questionnaire    Fear of Current or Ex-Partner: No    Emotionally Abused: No    Physically Abused: No    Sexually Abused: No    Family History: Family History  Problem Relation Age of Onset   Hypertension Mother    Hypertension Father    Prostate cancer Father    Cancer Maternal Aunt        spine/back   Cancer Paternal Grandfather        lung   Healthy Son    Healthy Son    Healthy Son    Healthy Son    Colon cancer Neg Hx      Current Medications:  Current Outpatient Medications:    ALPRAZolam  (XANAX ) 0.5 MG tablet, Take 1 tablet (0.5 mg total) by mouth 3 (three) times daily as  needed for anxiety., Disp: 90 tablet, Rfl: 3   amitriptyline  (ELAVIL ) 10 MG tablet, Take 1 tablet (10 mg total) by mouth 2 (two) times daily., Disp: 180 tablet, Rfl: 3   ARMOUR THYROID  60 MG tablet, Take 1 tablet (60 mg total) by mouth daily., Disp: 90 tablet, Rfl: 1   loperamide  (IMODIUM ) 2 MG capsule, Take 1 capsule (2 mg total) by mouth 3 (three) times daily. (Patient taking differently: Take 2 mg by mouth daily.), Disp: 30 capsule, Rfl: 0   Potassium Chloride  10 % SOLN, Take 15 ml (20 mEq) by mouth daily., Disp: 900 mL, Rfl: 2   prochlorperazine  (COMPAZINE ) 10 MG tablet, Take 1 tablet (10 mg total) by mouth every 6 (six) hours as needed for nausea or vomiting., Disp: 30 tablet, Rfl: 4   thyroid  (ARMOUR THYROID ) 30 MG tablet, Take 1 tablet (30 mg total) by mouth daily before breakfast every Monday., Disp: 21 tablet, Rfl: 2 No current facility-administered medications for this visit.  Facility-Administered Medications Ordered in Other Visits:    clindamycin  (CLEOCIN ) 900 mg in dextrose  5 % 50 mL IVPB, 900 mg, Intravenous, 60 min Pre-Op **AND** gentamicin  (GARAMYCIN ) 350 mg in dextrose  5 % 50 mL IVPB, 5 mg/kg, Intravenous, 60 min Pre-Op, Debby Hila, MD   clindamycin  (CLEOCIN ) 900 mg in dextrose  5 % 50 mL IVPB, 900 mg, Intravenous, 60 min Pre-Op **AND** gentamicin  (GARAMYCIN ) 5 mg/kg in dextrose  5 % 50 mL IVPB, 5 mg/kg, Intravenous, 60 min Pre-Op, Debby Hila, MD   fluorouracil  (ADRUCIL ) 3,500 mg in sodium chloride  0.9 % 80 mL chemo infusion, 1,920 mg/m2 (Treatment Plan Recorded), Intravenous, 1 day or 1 dose, Nikitha Mode, MD, Infusion Verify at 10/18/23 1224   sodium chloride  0.9 % 1,000 mL with potassium chloride  20 mEq, magnesium  sulfate 2 g infusion, , Intravenous, Once, Rogers Hai, MD   Allergies: Allergies   Allergen Reactions   Oxaliplatin  Other (See Comments)    Patient had hypersensitivity reaction to Oxaliplatin . Patient c/o itching in her ears and eyes. Pt also c/o being hot, flushed, dizzy with redness on pt's face, arms, and legs. Pt c/o tingling in her hands and legs. Pt also had a dry throat with coughing. See progress note from 09/07/22 at 1130. Patient was not able to complete Oxaliplatin  infusion.   Second reaction 10/26/2022. Itching in ears, heart racing, & face flushing. See progress note from 10/26/2022. Able to complete infusion after medications.   Demerol  [Meperidine ] Itching    All over the body.   Penicillins Hives     childhood, does not remember if it spread all over the body or not. Did it involve swelling of the face/tongue/throat, SOB, or low BP? No Did it involve sudden or severe rash/hives, skin peeling, or any reaction on the inside of your mouth or nose? Yes Did you need to seek medical attention at a hospital or doctor's office? Yes When did it last happen?      childhood allergy If all above answers are "NO", may proceed with cephalosporin use.    Levaquin  [Levofloxacin ] Other (See Comments)    Patient has Aortic Aneurysm and is not indicated with this diagnosis   Other Hives and Other (See Comments)    Fresh coconut    Vancomycin  Rash    Will need Benadryl  prior to administration per IV. Red man syndrome    REVIEW OF SYSTEMS:   Review of Systems  Constitutional:  Negative for chills, fatigue and fever.  HENT:   Negative for lump/mass,  mouth sores, nosebleeds, sore throat and trouble swallowing.   Eyes:  Negative for eye problems.  Respiratory:  Positive for cough. Negative for shortness of breath.   Cardiovascular:  Negative for chest pain, leg swelling and palpitations.  Gastrointestinal:  Positive for constipation and diarrhea. Negative for abdominal pain, nausea and vomiting.  Genitourinary:  Negative for bladder incontinence, difficulty urinating,  dysuria, frequency, hematuria and nocturia.   Musculoskeletal:  Negative for arthralgias, back pain, flank pain, myalgias and neck pain.  Skin:  Negative for itching and rash.  Neurological:  Negative for dizziness, headaches and numbness.  Hematological:  Does not bruise/bleed easily.  Psychiatric/Behavioral:  Negative for depression, sleep disturbance and suicidal ideas. The patient is not nervous/anxious.   All other systems reviewed and are negative.    VITALS:   Pulse 99, last menstrual period 02/16/2015.  Wt Readings from Last 3 Encounters:  10/18/23 129 lb (58.5 kg)  09/13/23 130 lb (59 kg)  08/16/23 124 lb 9.6 oz (56.5 kg)    There is no height or weight on file to calculate BMI.  Performance status (ECOG): 1 - Symptomatic but completely ambulatory  PHYSICAL EXAM:   Physical Exam Vitals and nursing note reviewed. Exam conducted with a chaperone present.  Constitutional:      Appearance: Normal appearance.  Cardiovascular:     Rate and Rhythm: Normal rate and regular rhythm.     Pulses: Normal pulses.     Heart sounds: Normal heart sounds.  Pulmonary:     Effort: Pulmonary effort is normal.     Breath sounds: Normal breath sounds.  Abdominal:     Palpations: Abdomen is soft. There is no hepatomegaly, splenomegaly or mass.     Tenderness: There is no abdominal tenderness.  Musculoskeletal:     Right lower leg: No edema.     Left lower leg: No edema.  Lymphadenopathy:     Cervical: No cervical adenopathy.     Right cervical: No superficial, deep or posterior cervical adenopathy.    Left cervical: No superficial, deep or posterior cervical adenopathy.     Upper Body:     Right upper body: No supraclavicular or axillary adenopathy.     Left upper body: No supraclavicular or axillary adenopathy.  Neurological:     General: No focal deficit present.     Mental Status: She is alert and oriented to person, place, and time.  Psychiatric:        Mood and Affect: Mood  normal.        Behavior: Behavior normal.     LABS:   CBC     Component Value Date/Time   WBC 2.0 (L) 10/18/2023 0838   RBC 3.22 (L) 10/18/2023 0838   HGB 11.0 (L) 10/18/2023 0838   HGB 13.5 11/03/2015 0841   HCT 33.0 (L) 10/18/2023 0838   HCT 39.7 11/03/2015 0841   PLT 60 (L) 10/18/2023 0838   PLT 165 11/03/2015 0841   MCV 102.5 (H) 10/18/2023 0838   MCV 95 11/03/2015 0841   MCH 34.2 (H) 10/18/2023 0838   MCHC 33.3 10/18/2023 0838   RDW 14.6 10/18/2023 0838   RDW 13.2 11/03/2015 0841   LYMPHSABS 0.4 (L) 10/18/2023 0838   LYMPHSABS 1.4 11/03/2015 0841   MONOABS 0.1 10/18/2023 0838   EOSABS 0.0 10/18/2023 0838   EOSABS 0.1 11/03/2015 0841   BASOSABS 0.0 10/18/2023 0838   BASOSABS 0.0 11/03/2015 0841    CMP      Component Value Date/Time  NA 137 10/18/2023 0838   NA 140 11/03/2015 0841   K 3.5 10/18/2023 0838   CL 105 10/18/2023 0838   CO2 22 10/18/2023 0838   GLUCOSE 100 (H) 10/18/2023 0838   BUN 13 10/18/2023 0838   BUN 10 11/03/2015 0841   CREATININE 0.74 10/18/2023 0838   CALCIUM  8.7 (L) 10/18/2023 0838   PROT 6.7 10/18/2023 0838   PROT 6.9 11/03/2015 0841   ALBUMIN 3.6 10/18/2023 0838   ALBUMIN 4.5 11/03/2015 0841   AST 27 10/18/2023 0838   ALT 14 10/18/2023 0838   ALKPHOS 142 (H) 10/18/2023 0838   BILITOT 2.9 (H) 10/18/2023 0838   BILITOT 2.1 (H) 11/03/2015 0841   GFRNONAA >60 10/18/2023 0838   GFRAA >60 11/21/2019 1348     Lab Results  Component Value Date   CEA1 4.3 09/13/2023   /  CEA  Date Value Ref Range Status  09/13/2023 4.3 0.0 - 4.7 ng/mL Final    Comment:    (NOTE)                             Nonsmokers          <3.9                             Smokers             <5.6 Roche [employer] Electrochemiluminescence Immunoassay (ECLIA) Values obtained with different assay methods or kits cannot be used interchangeably.  Results cannot be interpreted as absolute evidence of the presence or absence of malignant disease. Performed  At: [facility] [address] [place], [place] [id number] Jennette Shorter MD Ey:[phone]    No results found for: PSA1 No results found for: RJW800 No results found for: CAN125  No results found for: STEPHANY RINGS, A1GS, A2GS, BETS, BETA2SER, GAMS, MSPIKE, SPEI Lab Results  Component Value Date   TIBC 333 09/07/2022   TIBC 364 05/11/2022   TIBC 385 11/09/2020   FERRITIN 89 09/07/2022   FERRITIN 61 05/11/2022   FERRITIN 54 11/09/2020   IRONPCTSAT 36 (H) 09/07/2022   IRONPCTSAT 26 05/11/2022   IRONPCTSAT 22 11/09/2020   Lab Results  Component Value Date   LDH 154 01/20/2020     STUDIES:   No results found.

## 2023-10-18 ENCOUNTER — Inpatient Hospital Stay (HOSPITAL_BASED_OUTPATIENT_CLINIC_OR_DEPARTMENT_OTHER): Admitting: Hematology

## 2023-10-18 ENCOUNTER — Inpatient Hospital Stay: Attending: Hematology

## 2023-10-18 ENCOUNTER — Encounter: Payer: Self-pay | Admitting: Hematology

## 2023-10-18 ENCOUNTER — Inpatient Hospital Stay

## 2023-10-18 VITALS — BP 121/85 | HR 87 | Temp 97.4°F | Resp 18

## 2023-10-18 VITALS — BP 122/86 | HR 102 | Temp 97.5°F | Resp 19 | Wt 129.0 lb

## 2023-10-18 VITALS — HR 99

## 2023-10-18 DIAGNOSIS — Z9221 Personal history of antineoplastic chemotherapy: Secondary | ICD-10-CM | POA: Insufficient documentation

## 2023-10-18 DIAGNOSIS — Z923 Personal history of irradiation: Secondary | ICD-10-CM | POA: Diagnosis not present

## 2023-10-18 DIAGNOSIS — Z79890 Hormone replacement therapy (postmenopausal): Secondary | ICD-10-CM | POA: Diagnosis not present

## 2023-10-18 DIAGNOSIS — C787 Secondary malignant neoplasm of liver and intrahepatic bile duct: Secondary | ICD-10-CM

## 2023-10-18 DIAGNOSIS — D72819 Decreased white blood cell count, unspecified: Secondary | ICD-10-CM | POA: Insufficient documentation

## 2023-10-18 DIAGNOSIS — Z9049 Acquired absence of other specified parts of digestive tract: Secondary | ICD-10-CM | POA: Insufficient documentation

## 2023-10-18 DIAGNOSIS — Z85850 Personal history of malignant neoplasm of thyroid: Secondary | ICD-10-CM | POA: Insufficient documentation

## 2023-10-18 DIAGNOSIS — Z5111 Encounter for antineoplastic chemotherapy: Secondary | ICD-10-CM | POA: Insufficient documentation

## 2023-10-18 DIAGNOSIS — Z880 Allergy status to penicillin: Secondary | ICD-10-CM | POA: Insufficient documentation

## 2023-10-18 DIAGNOSIS — D696 Thrombocytopenia, unspecified: Secondary | ICD-10-CM | POA: Insufficient documentation

## 2023-10-18 DIAGNOSIS — E039 Hypothyroidism, unspecified: Secondary | ICD-10-CM | POA: Diagnosis not present

## 2023-10-18 DIAGNOSIS — R918 Other nonspecific abnormal finding of lung field: Secondary | ICD-10-CM | POA: Insufficient documentation

## 2023-10-18 DIAGNOSIS — C7931 Secondary malignant neoplasm of brain: Secondary | ICD-10-CM | POA: Diagnosis not present

## 2023-10-18 DIAGNOSIS — K5900 Constipation, unspecified: Secondary | ICD-10-CM | POA: Diagnosis not present

## 2023-10-18 DIAGNOSIS — E785 Hyperlipidemia, unspecified: Secondary | ICD-10-CM | POA: Diagnosis not present

## 2023-10-18 DIAGNOSIS — Z8249 Family history of ischemic heart disease and other diseases of the circulatory system: Secondary | ICD-10-CM | POA: Insufficient documentation

## 2023-10-18 DIAGNOSIS — Z87891 Personal history of nicotine dependence: Secondary | ICD-10-CM | POA: Diagnosis not present

## 2023-10-18 DIAGNOSIS — Z95828 Presence of other vascular implants and grafts: Secondary | ICD-10-CM

## 2023-10-18 DIAGNOSIS — Z881 Allergy status to other antibiotic agents status: Secondary | ICD-10-CM | POA: Diagnosis not present

## 2023-10-18 DIAGNOSIS — C20 Malignant neoplasm of rectum: Secondary | ICD-10-CM | POA: Diagnosis not present

## 2023-10-18 DIAGNOSIS — E876 Hypokalemia: Secondary | ICD-10-CM | POA: Insufficient documentation

## 2023-10-18 DIAGNOSIS — Z79899 Other long term (current) drug therapy: Secondary | ICD-10-CM | POA: Insufficient documentation

## 2023-10-18 DIAGNOSIS — Z801 Family history of malignant neoplasm of trachea, bronchus and lung: Secondary | ICD-10-CM | POA: Insufficient documentation

## 2023-10-18 DIAGNOSIS — Z5112 Encounter for antineoplastic immunotherapy: Secondary | ICD-10-CM | POA: Insufficient documentation

## 2023-10-18 DIAGNOSIS — R059 Cough, unspecified: Secondary | ICD-10-CM | POA: Insufficient documentation

## 2023-10-18 DIAGNOSIS — Z8042 Family history of malignant neoplasm of prostate: Secondary | ICD-10-CM | POA: Insufficient documentation

## 2023-10-18 DIAGNOSIS — R161 Splenomegaly, not elsewhere classified: Secondary | ICD-10-CM | POA: Diagnosis not present

## 2023-10-18 DIAGNOSIS — Z79631 Long term (current) use of antimetabolite agent: Secondary | ICD-10-CM | POA: Diagnosis not present

## 2023-10-18 DIAGNOSIS — Z885 Allergy status to narcotic agent status: Secondary | ICD-10-CM | POA: Insufficient documentation

## 2023-10-18 LAB — COMPREHENSIVE METABOLIC PANEL WITH GFR
ALT: 14 U/L (ref 0–44)
AST: 27 U/L (ref 15–41)
Albumin: 3.6 g/dL (ref 3.5–5.0)
Alkaline Phosphatase: 142 U/L — ABNORMAL HIGH (ref 38–126)
Anion gap: 10 (ref 5–15)
BUN: 13 mg/dL (ref 6–20)
CO2: 22 mmol/L (ref 22–32)
Calcium: 8.7 mg/dL — ABNORMAL LOW (ref 8.9–10.3)
Chloride: 105 mmol/L (ref 98–111)
Creatinine, Ser: 0.74 mg/dL (ref 0.44–1.00)
GFR, Estimated: >60 mL/min (ref >60)
Glucose, Bld: 100 mg/dL — ABNORMAL HIGH (ref 70–99)
Potassium: 3.5 mmol/L (ref 3.5–5.1)
Sodium: 137 mmol/L (ref 135–145)
Total Bilirubin: 2.9 mg/dL — ABNORMAL HIGH (ref 0.0–1.2)
Total Protein: 6.7 g/dL (ref 6.5–8.1)

## 2023-10-18 LAB — URINALYSIS, DIPSTICK ONLY
Bilirubin Urine: NEGATIVE
Glucose, UA: NEGATIVE mg/dL
Hgb urine dipstick: NEGATIVE
Ketones, ur: NEGATIVE mg/dL
Nitrite: NEGATIVE
Protein, ur: NEGATIVE mg/dL
Specific Gravity, Urine: 1.014 (ref 1.005–1.030)
pH: 5 (ref 5.0–8.0)

## 2023-10-18 LAB — CBC WITH DIFFERENTIAL/PLATELET
Abs Immature Granulocytes: 0.01 K/uL (ref 0.00–0.07)
Basophils Absolute: 0 K/uL (ref 0.0–0.1)
Basophils Relative: 1 %
Eosinophils Absolute: 0 K/uL (ref 0.0–0.5)
Eosinophils Relative: 1 %
HCT: 33 % — ABNORMAL LOW (ref 36.0–46.0)
Hemoglobin: 11 g/dL — ABNORMAL LOW (ref 12.0–15.0)
Immature Granulocytes: 1 %
Lymphocytes Relative: 19 %
Lymphs Abs: 0.4 K/uL — ABNORMAL LOW (ref 0.7–4.0)
MCH: 34.2 pg — ABNORMAL HIGH (ref 26.0–34.0)
MCHC: 33.3 g/dL (ref 30.0–36.0)
MCV: 102.5 fL — ABNORMAL HIGH (ref 80.0–100.0)
Monocytes Absolute: 0.1 K/uL (ref 0.1–1.0)
Monocytes Relative: 7 %
Neutro Abs: 1.5 K/uL — ABNORMAL LOW (ref 1.7–7.7)
Neutrophils Relative %: 71 %
Platelets: 60 K/uL — ABNORMAL LOW (ref 150–400)
RBC: 3.22 MIL/uL — ABNORMAL LOW (ref 3.87–5.11)
RDW: 14.6 % (ref 11.5–15.5)
WBC: 2 K/uL — ABNORMAL LOW (ref 4.0–10.5)
nRBC: 0 % (ref 0.0–0.2)

## 2023-10-18 LAB — MAGNESIUM: Magnesium: 2.1 mg/dL (ref 1.7–2.4)

## 2023-10-18 MED ORDER — FAMOTIDINE IN NACL 20-0.9 MG/50ML-% IV SOLN
20.0000 mg | Freq: Once | INTRAVENOUS | Status: AC
  Administered 2023-10-18: 20 mg via INTRAVENOUS
  Filled 2023-10-18: qty 50

## 2023-10-18 MED ORDER — SODIUM CHLORIDE 0.9 % IV SOLN
320.0000 mg/m2 | Freq: Once | INTRAVENOUS | Status: AC
  Administered 2023-10-18: 540 mg via INTRAVENOUS
  Filled 2023-10-18: qty 27

## 2023-10-18 MED ORDER — PALONOSETRON HCL INJECTION 0.25 MG/5ML
0.2500 mg | Freq: Once | INTRAVENOUS | Status: AC
  Administered 2023-10-18: 0.25 mg via INTRAVENOUS
  Filled 2023-10-18: qty 5

## 2023-10-18 MED ORDER — SODIUM CHLORIDE 0.9 % IV SOLN
Freq: Once | INTRAVENOUS | Status: AC
Start: 2023-10-18 — End: 2023-10-18

## 2023-10-18 MED ORDER — SODIUM CHLORIDE FLUSH 0.9 % IV SOLN
10.0000 mL | Freq: Once | INTRAVENOUS | Status: AC
  Administered 2023-10-18: 10 mL via INTRAVENOUS
  Filled 2023-10-18: qty 10

## 2023-10-18 MED ORDER — FLUOROURACIL CHEMO INJECTION 500 MG/10ML
320.0000 mg/m2 | Freq: Once | INTRAVENOUS | Status: AC
  Administered 2023-10-18: 500 mg via INTRAVENOUS
  Filled 2023-10-18: qty 10

## 2023-10-18 MED ORDER — SODIUM CHLORIDE 0.9 % IV SOLN
5.0000 mg/kg | Freq: Once | INTRAVENOUS | Status: AC
  Administered 2023-10-18: 300 mg via INTRAVENOUS
  Filled 2023-10-18: qty 12

## 2023-10-18 MED ORDER — DEXAMETHASONE SODIUM PHOSPHATE 10 MG/ML IJ SOLN
10.0000 mg | Freq: Once | INTRAMUSCULAR | Status: AC
  Administered 2023-10-18: 10 mg via INTRAVENOUS
  Filled 2023-10-18: qty 1

## 2023-10-18 MED ORDER — SODIUM CHLORIDE 0.9 % IV SOLN
1920.0000 mg/m2 | INTRAVENOUS | Status: DC
  Administered 2023-10-18: 3500 mg via INTRAVENOUS
  Filled 2023-10-18: qty 70

## 2023-10-18 NOTE — Progress Notes (Signed)
 Patient has been examined by Dr. Rogers. Vital signs and labs have been reviewed by MD - ANC, Creatinine, LFTs (total bilirubin 2.9), hemoglobin, and platelets (60) are within treatment parameters per M.D. - pt may proceed with treatment.  Primary RN and pharmacy notified.

## 2023-10-18 NOTE — Patient Instructions (Signed)
 [facility] [place] - A DEPT OF [facility]  Discharge Instructions: Thank you for choosing [facility] to provide your oncology and hematology care.  If you have a lab appointment with the Cancer Center - please note that after April 8th, 2024, all labs will be drawn in the cancer center.  You do not have to check in or register with the main entrance as you have in the past but will complete your check-in in the cancer center.  Wear comfortable clothing and clothing appropriate for easy access to any Portacath or PICC line.   We strive to give you quality time with your provider. You may need to reschedule your appointment if you arrive late (15 or more minutes).  Arriving late affects you and other patients whose appointments are after yours.  Also, if you miss three or more appointments without notifying the office, you may be dismissed from the clinic at the provider's discretion.      For prescription refill requests, have your pharmacy contact our office and allow 72 hours for refills to be completed.    Today you received the following chemotherapy and/or immunotherapy agents MVASI/Leucovorin/5FU.  Bevacizumab Injection What is this medication? BEVACIZUMAB (be va SIZ yoo mab) treats some types of cancer. It works by blocking a protein that causes cancer cells to grow and multiply. This helps to slow or stop the spread of cancer cells. It is a monoclonal antibody. This medicine may be used for other purposes; ask your health care provider or pharmacist if you have questions. COMMON BRAND NAME(S): Alymsys, Avastin, MVASI, [place] Lao What should I tell my care team before I take this medication? They need to know if you have any of these conditions: Blood clots Coughing up blood Having or recent surgery Heart failure High blood pressure History of a connection between 2 or more body parts that do not usually connect (fistula) History of a  tear in your stomach or intestines Protein in your urine An unusual or allergic reaction to bevacizumab, other medications, foods, dyes, or preservatives Pregnant or trying to get pregnant Breast-feeding How should I use this medication? This medication is injected into a vein. It is given by your care team in a hospital or clinic setting. Talk to your care team the use of this medication in children. Special care may be needed. Overdosage: If you think you have taken too much of this medicine contact a poison control center or emergency room at once. NOTE: This medicine is only for you. Do not share this medicine with others. What if I miss a dose? Keep appointments for follow-up doses. It is important not to miss your dose. Call your care team if you are unable to keep an appointment. What may interact with this medication? Interactions are not expected. This list may not describe all possible interactions. Give your health care provider a list of all the medicines, herbs, non-prescription drugs, or dietary supplements you use. Also tell them if you smoke, drink alcohol, or use illegal drugs. Some items may interact with your medicine. What should I watch for while using this medication? Your condition will be monitored carefully while you are receiving this medication. You may need blood work while taking this medication. This medication may make you feel generally unwell. This is not uncommon as chemotherapy can affect healthy cells as well as cancer cells. Report any side effects. Continue your course of treatment even though you feel  ill unless your care team tells you to stop. This medication may increase your risk to bruise or bleed. Call your care team if you notice any unusual bleeding. Before having surgery, talk to your care team to make sure it is ok. This medication can increase the risk of poor healing of your surgical site or wound. You will need to stop this medication for 28 days  before surgery. After surgery, wait at least 28 days before restarting this medication. Make sure the surgical site or wound is healed enough before restarting this medication. Talk to your care team if questions. Talk to your care team if you may be pregnant. Serious birth defects can occur if you take this medication during pregnancy and for 6 months after the last dose. Contraception is recommended while taking this medication and for 6 months after the last dose. Your care team can help you find the option that works for you. Do not breastfeed while taking this medication and for 6 months after the last dose. This medication can cause infertility. Talk to your care team if you are concerned about your fertility. What side effects may I notice from receiving this medication? Side effects that you should report to your care team as soon as possible: Allergic reactions--skin rash, itching, hives, swelling of the face, lips, tongue, or throat Bleeding--bloody or black, tar-like stools, vomiting blood or [place] material that looks like coffee grounds, red or dark [place] urine, small red or purple spots on skin, unusual bruising or bleeding Blood clot--pain, swelling, or warmth in the leg, shortness of breath, chest pain Heart attack--pain or tightness in the chest, shoulders, arms, or jaw, nausea, shortness of breath, cold or clammy skin, feeling faint or lightheaded Heart failure--shortness of breath, swelling of the ankles, feet, or hands, sudden weight gain, unusual weakness or fatigue Increase in blood pressure Infection--fever, chills, cough, sore throat, wounds that don't heal, pain or trouble when passing urine, general feeling of discomfort or being unwell Infusion reactions--chest pain, shortness of breath or trouble breathing, feeling faint or lightheaded Kidney injury--decrease in the amount of urine, swelling of the ankles, hands, or feet Stomach pain that is severe, does not go away, or gets  worse Stroke--sudden numbness or weakness of the face, arm, or leg, trouble speaking, confusion, trouble walking, loss of balance or coordination, dizziness, severe headache, change in vision Sudden and severe headache, confusion, change in vision, seizures, which may be signs of posterior reversible encephalopathy syndrome (PRES) Side effects that usually do not require medical attention (report to your care team if they continue or are bothersome): Back pain Change in taste Diarrhea Dry skin Increased tears Nosebleed This list may not describe all possible side effects. Call your doctor for medical advice about side effects. You may report side effects to FDA at 1-800-FDA-1088. Where should I keep my medication? This medication is given in a hospital or clinic. It will not be stored at home. NOTE: This sheet is a summary. It may not cover all possible information. If you have questions about this medicine, talk to your doctor, pharmacist, or health care provider.  2024 Elsevier/Gold Standard (2021-07-30 00:00:00)   Leucovorin Injection What is this medication? LEUCOVORIN (loo koe VOR in) prevents side effects from certain medications, such as methotrexate. It works by increasing folate levels. This helps protect healthy cells in your body. It may also be used to treat anemia caused by low levels of folate. It can also be used with fluorouracil, a type  of chemotherapy, to treat colorectal cancer. It works by increasing the effects of fluorouracil in the body. This medicine may be used for other purposes; ask your health care provider or pharmacist if you have questions. What should I tell my care team before I take this medication? They need to know if you have any of these conditions: Anemia from low levels of vitamin B12 in the blood An unusual or allergic reaction to leucovorin, folic acid, other medications, foods, dyes, or preservatives Pregnant or trying to get  pregnant Breastfeeding How should I use this medication? This medication is injected into a vein or a muscle. It is given by your care team in a hospital or clinic setting. Talk to your care team about the use of this medication in children. Special care may be needed. Overdosage: If you think you have taken too much of this medicine contact a poison control center or emergency room at once. NOTE: This medicine is only for you. Do not share this medicine with others. What if I miss a dose? Keep appointments for follow-up doses. It is important not to miss your dose. Call your care team if you are unable to keep an appointment. What may interact with this medication? Capecitabine Fluorouracil Phenobarbital Phenytoin Primidone Trimethoprim;sulfamethoxazole This list may not describe all possible interactions. Give your health care provider a list of all the medicines, herbs, non-prescription drugs, or dietary supplements you use. Also tell them if you smoke, drink alcohol, or use illegal drugs. Some items may interact with your medicine. What should I watch for while using this medication? Your condition will be monitored carefully while you are receiving this medication. This medication may increase the side effects of 5-fluorouracil. Tell your care team if you have diarrhea or mouth sores that do not get better or that get worse. What side effects may I notice from receiving this medication? Side effects that you should report to your care team as soon as possible: Allergic reactions--skin rash, itching, hives, swelling of the face, lips, tongue, or throat This list may not describe all possible side effects. Call your doctor for medical advice about side effects. You may report side effects to FDA at 1-800-FDA-1088. Where should I keep my medication? This medication is given in a hospital or clinic. It will not be stored at home. NOTE: This sheet is a summary. It may not cover all possible  information. If you have questions about this medicine, talk to your doctor, pharmacist, or health care provider.  2024 Elsevier/Gold Standard (2021-08-17 00:00:00)   Fluorouracil Injection What is this medication? FLUOROURACIL (flure oh YOOR a sil) treats some types of cancer. It works by slowing down the growth of cancer cells. This medicine may be used for other purposes; ask your health care provider or pharmacist if you have questions. COMMON BRAND NAME(S): Adrucil What should I tell my care team before I take this medication? They need to know if you have any of these conditions: Blood disorders Dihydropyrimidine dehydrogenase (DPD) deficiency Infection, such as chickenpox, cold sores, herpes Kidney disease Liver disease Poor nutrition Recent or ongoing radiation therapy An unusual or allergic reaction to fluorouracil, other medications, foods, dyes, or preservatives If you or your partner are pregnant or trying to get pregnant Breast-feeding How should I use this medication? This medication is injected into a vein. It is administered by your care team in a hospital or clinic setting. Talk to your care team about the use of this medication in children.  Special care may be needed. Overdosage: If you think you have taken too much of this medicine contact a poison control center or emergency room at once. NOTE: This medicine is only for you. Do not share this medicine with others. What if I miss a dose? Keep appointments for follow-up doses. It is important not to miss your dose. Call your care team if you are unable to keep an appointment. What may interact with this medication? Do not take this medication with any of the following: Live virus vaccines This medication may also interact with the following: Medications that treat or prevent blood clots, such as warfarin, enoxaparin, dalteparin This list may not describe all possible interactions. Give your health care provider a  list of all the medicines, herbs, non-prescription drugs, or dietary supplements you use. Also tell them if you smoke, drink alcohol, or use illegal drugs. Some items may interact with your medicine. What should I watch for while using this medication? Your condition will be monitored carefully while you are receiving this medication. This medication may make you feel generally unwell. This is not uncommon as chemotherapy can affect healthy cells as well as cancer cells. Report any side effects. Continue your course of treatment even though you feel ill unless your care team tells you to stop. In some cases, you may be given additional medications to help with side effects. Follow all directions for their use. This medication may increase your risk of getting an infection. Call your care team for advice if you get a fever, chills, sore throat, or other symptoms of a cold or flu. Do not treat yourself. Try to avoid being around people who are sick. This medication may increase your risk to bruise or bleed. Call your care team if you notice any unusual bleeding. Be careful brushing or flossing your teeth or using a toothpick because you may get an infection or bleed more easily. If you have any dental work done, tell your dentist you are receiving this medication. Avoid taking medications that contain aspirin, acetaminophen, ibuprofen, naproxen, or ketoprofen unless instructed by your care team. These medications may hide a fever. Do not treat diarrhea with over the counter products. Contact your care team if you have diarrhea that lasts more than 2 days or if it is severe and watery. This medication can make you more sensitive to the sun. Keep out of the sun. If you cannot avoid being in the sun, wear protective clothing and sunscreen. Do not use sun lamps, tanning beds, or tanning booths. Talk to your care team if you or your partner wish to become pregnant or think you might be pregnant. This medication  can cause serious birth defects if taken during pregnancy and for 3 months after the last dose. A reliable form of contraception is recommended while taking this medication and for 3 months after the last dose. Talk to your care team about effective forms of contraception. Do not father a child while taking this medication and for 3 months after the last dose. Use a condom while having sex during this time period. Do not breastfeed while taking this medication. This medication may cause infertility. Talk to your care team if you are concerned about your fertility. What side effects may I notice from receiving this medication? Side effects that you should report to your care team as soon as possible: Allergic reactions--skin rash, itching, hives, swelling of the face, lips, tongue, or throat Heart attack--pain or tightness in the chest,  shoulders, arms, or jaw, nausea, shortness of breath, cold or clammy skin, feeling faint or lightheaded Heart failure--shortness of breath, swelling of the ankles, feet, or hands, sudden weight gain, unusual weakness or fatigue Heart rhythm changes--fast or irregular heartbeat, dizziness, feeling faint or lightheaded, chest pain, trouble breathing High ammonia level--unusual weakness or fatigue, confusion, loss of appetite, nausea, vomiting, seizures Infection--fever, chills, cough, sore throat, wounds that don't heal, pain or trouble when passing urine, general feeling of discomfort or being unwell Low red blood cell level--unusual weakness or fatigue, dizziness, headache, trouble breathing Pain, tingling, or numbness in the hands or feet, muscle weakness, change in vision, confusion or trouble speaking, loss of balance or coordination, trouble walking, seizures Redness, swelling, and blistering of the skin over hands and feet Severe or prolonged diarrhea Unusual bruising or bleeding Side effects that usually do not require medical attention (report to your care team  if they continue or are bothersome): Dry skin Headache Increased tears Nausea Pain, redness, or swelling with sores inside the mouth or throat Sensitivity to light Vomiting This list may not describe all possible side effects. Call your doctor for medical advice about side effects. You may report side effects to FDA at 1-800-FDA-1088. Where should I keep my medication? This medication is given in a hospital or clinic. It will not be stored at home. NOTE: This sheet is a summary. It may not cover all possible information. If you have questions about this medicine, talk to your doctor, pharmacist, or health care provider.  2024 Elsevier/Gold Standard (2021-07-20 00:00:00)       To help prevent nausea and vomiting after your treatment, we encourage you to take your nausea medication as directed.  BELOW ARE SYMPTOMS THAT SHOULD BE REPORTED IMMEDIATELY: *FEVER GREATER THAN 100.4 F (38 C) OR HIGHER *CHILLS OR SWEATING *NAUSEA AND VOMITING THAT IS NOT CONTROLLED WITH YOUR NAUSEA MEDICATION *UNUSUAL SHORTNESS OF BREATH *UNUSUAL BRUISING OR BLEEDING *URINARY PROBLEMS (pain or burning when urinating, or frequent urination) *BOWEL PROBLEMS (unusual diarrhea, constipation, pain near the anus) TENDERNESS IN MOUTH AND THROAT WITH OR WITHOUT PRESENCE OF ULCERS (sore throat, sores in mouth, or a toothache) UNUSUAL RASH, SWELLING OR PAIN  UNUSUAL VAGINAL DISCHARGE OR ITCHING   Items with * indicate a potential emergency and should be followed up as soon as possible or go to the Emergency Department if any problems should occur.  Please show the CHEMOTHERAPY ALERT CARD or IMMUNOTHERAPY ALERT CARD at check-in to the Emergency Department and triage nurse.  Should you have questions after your visit or need to cancel or reschedule your appointment, please contact [facility] [place] - A DEPT OF [facility] [phone]  and follow the prompts.  Office hours are 8:00 a.m. to  4:30 p.m. Monday - Friday. Please note that voicemails left after 4:00 p.m. may not be returned until the following business day.  We are closed weekends and major holidays. You have access to a nurse at all times for urgent questions. Please call the main number to the clinic [phone] and follow the prompts.  For any non-urgent questions, you may also contact your provider using MyChart. We now offer e-Visits for anyone 19 and older to request care online for non-urgent symptoms. For details visit [email or web address].   Also download the MyChart app! Go to the app store, search "MyChart", open the app, select [place], and log in with your MyChart username and password.

## 2023-10-18 NOTE — Progress Notes (Signed)
 Patient presents today for chemotherapy infusion. Patient is in satisfactory condition with no new complaints voiced.  Vital signs are stable.  Labs reviewed by Dr. Rogers during the office visit.  Platelets today are 60 and bilirubin is 2.9.  MD aware.  Urine protein is negative.  We will proceed with treatment per MD orders.   Patient tolerated treatment well with no complaints voiced.  Home infusion 5FU pump connected.  Patient left ambulatory with husband in stable condition.  Vital signs stable at discharge.  Follow up as scheduled.

## 2023-10-18 NOTE — Patient Instructions (Signed)

## 2023-10-19 LAB — CEA: CEA: 4.9 ng/mL — ABNORMAL HIGH (ref 0.0–4.7)

## 2023-10-20 ENCOUNTER — Inpatient Hospital Stay

## 2023-10-20 VITALS — BP 140/83 | HR 93 | Resp 16

## 2023-10-20 DIAGNOSIS — Z5112 Encounter for antineoplastic immunotherapy: Secondary | ICD-10-CM | POA: Diagnosis not present

## 2023-10-20 DIAGNOSIS — C20 Malignant neoplasm of rectum: Secondary | ICD-10-CM

## 2023-10-20 MED ORDER — SODIUM CHLORIDE 0.9% FLUSH
10.0000 mL | INTRAVENOUS | Status: DC | PRN
  Administered 2023-10-20: 10 mL

## 2023-10-20 MED ORDER — HEPARIN SOD (PORK) LOCK FLUSH 100 UNIT/ML IV SOLN
500.0000 [IU] | Freq: Once | INTRAVENOUS | Status: AC | PRN
Start: 2023-10-20 — End: 2023-10-20
  Administered 2023-10-20: 500 [IU]

## 2023-10-20 NOTE — Progress Notes (Signed)
 Patients port flushed without difficulty.  Good blood return noted with no bruising or swelling noted at site. Home infusion 5FU pump disconnected.  Band aid applied.  VSS with discharge and left in satisfactory condition with no s/s of distress noted.

## 2023-10-20 NOTE — Patient Instructions (Signed)

## 2023-10-21 ENCOUNTER — Other Ambulatory Visit: Payer: Self-pay

## 2023-11-01 ENCOUNTER — Ambulatory Visit (HOSPITAL_COMMUNITY)
Admission: RE | Admit: 2023-11-01 | Discharge: 2023-11-01 | Disposition: A | Discharge status: Home / Self Care | Source: Ambulatory Visit | Attending: Radiation Oncology | Admitting: Radiation Oncology

## 2023-11-01 DIAGNOSIS — C7931 Secondary malignant neoplasm of brain: Secondary | ICD-10-CM | POA: Diagnosis not present

## 2023-11-01 DIAGNOSIS — C801 Malignant (primary) neoplasm, unspecified: Secondary | ICD-10-CM | POA: Diagnosis not present

## 2023-11-01 MED ORDER — GADOBUTROL 1 MMOL/ML IV SOLN
5.0000 mL | Freq: Once | INTRAVENOUS | Status: AC | PRN
  Administered 2023-11-01: 5 mL via INTRAVENOUS

## 2023-11-06 ENCOUNTER — Ambulatory Visit
Admission: RE | Admit: 2023-11-06 | Discharge: 2023-11-06 | Disposition: A | Discharge status: Home / Self Care | Source: Ambulatory Visit | Attending: Radiation Oncology | Admitting: Radiation Oncology

## 2023-11-06 ENCOUNTER — Inpatient Hospital Stay

## 2023-11-06 DIAGNOSIS — C20 Malignant neoplasm of rectum: Secondary | ICD-10-CM

## 2023-11-06 DIAGNOSIS — C7951 Secondary malignant neoplasm of bone: Secondary | ICD-10-CM

## 2023-11-06 DIAGNOSIS — C7931 Secondary malignant neoplasm of brain: Secondary | ICD-10-CM

## 2023-11-06 NOTE — Progress Notes (Signed)
 Radiation Oncology         [phone] ________________________________  Outpatient Follow Up- Conducted via telephone at patient request.  I spoke with the patient to conduct this  visit via telephone. The patient was notified in advance and was offered an in person or telemedicine meeting to allow for face to face communication but instead preferred to proceed with a telephone visit.   Name: OFFIE PICKRON MRN: [id number]  Date of Service: 11/06/2023  DOB: [birth year]   Diagnosis:  Progressive Metastatic Adenocarcinoma of the rectum.  Prior Radiation:    08/31/22  SRS Treatment [id number] was treated to 20 Gy in 1 fraction is SRS technique  07/14/22-08/03/22: Site: Cervical Spine Technique: 3D Mode: Photon Dose Per Fraction: 2.5 Gy Prescribed Dose (Delivered / Prescribed): 37.5 Gy / 37.5 Gy Prescribed Fxs (Delivered / Prescribed): 15 / 15  09/21/2021 through 10/05/2021 Ultrahypofractionated  Treatment Site Technique Total Dose (Gy) Dose per Fx (Gy) Completed Fx Beam Energies  Lung, Left: Multifocal Left lung and Subcarinal node IMRT 50/50 5 10/10 6XFFF    08/06/20-08/13/20:  SBRT Treatment The tumor in the RUL was treated with a course of stereotactic body radiation treatment. The patient received 54 Gy In 3 fractions at 18 G per fraction.  12/04/2019-12/13/2019: SBRT Treatment The patient was treated to the liver with a course of stereotactic body radiation treatment.  The patient received 60 Gray in 5 fractions using a SBRT/IMRT technique, with 3 fields.  11/15/19 - 11/22/19:  SBRT Treatment The patient was treated to the left lower lobe with a course of stereotactic body radiation treatment.  The patient received 54 Gray in 3 fractions using a SBRT/IMRT technique, with 3 fields.  07/30/2018-09/06/2018: The rectum was treated to 45 Gy in 25 fractions, and 5.4 Gy boost in 3 fractions  Narrative:  The patient is a delightful 58 y.o. female with a history of Stage IV,  [id number] adenocarcinoma of the rectum. She received total neoadjuvant chemotherapy and completed this in April 2020 followed by chemoRT in the spring of 2020. She subsequently underwent robotic LAR with loop ileostomy in September 2020 with 2 cm of residual disease but negative nodes and margins. She had reversal of the ileostomy in January 2021, also microwave ablation to a right hepatic lesion by Dr. Luke at [facility] in May 2021. She had another lesion that was difficult to access for ablation. In the interim of additional imaging, a lesion in the left lung was noted and treated with SBRT in August 2021. She also was able to undergo SBRT to the liver lesion in September 2021.  She developed additional lung disease and was treated  to a RUL target in the lung in May of 2022 with ultrahypofractionated course with multifocal areas in the left lung and subcarinal station in July 2023.  She was found to have progression of disease involving the liver and right adrenal gland by diagnostic imaging.  A liver biopsy on 06/09/2022 confirmed active metastatic malignancy consistent with her rectal primary.  An MRI of the cervical spine on 07/01/2022 was also performed and confirmed a metastatic lesion in the C6 vertebral body extending to the right posterior elements with extraosseous tumor along with posterior endplate extending into the right C5-C6 and C6-C7 neural foramen.  She went on to receive palliative radiation to her cervical spine and during the course of her therapy complained of numbness in her face which prompted an MRI of the brain on 08/18/2022 which showed a 4 mm enhancing  area of focus in the left frontal calvarium as well as a 3 mm nodular enhancing lesion in the anterior right frontal lobe of the brain.  She went on to receive stereotactic radiosurgery which she completed on 08/31/2022.    Over the course of her surveillance for her brain there have been comments on a lesion in the left frontal calvarium which  has remained stable in retrospect, the patient does have a remote history of trauma after a fall during a syncopal event.  This area also is to be followed.  Her most recent MRI of the brain on 11/01/23 continued to show no evidence of intracranial disease and stable changes in the left frontal calvarium as well as mild cerebral white matter chronic small vessel ischemic disease.  She continues with systemic 5-FU, leucovorin  and Avastin  with Dr. Katragadda and will see Dr. Davonna moving forward. Her last PET scan on 09/07/23 indicated hypermetabolic activity in several pulmonary nodules ranging with SUVs of 2.1-5.6.(LLL). Plans are to repeat her next CT scan in September 2025. She is contacted today over the phone to review her MRI results.  On review of systems, the patient reports she continues to do pretty well. 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  She's had difficulty with chronic cough for several months. She had tried a PPI for several weeks and while it sounds like it was improved, it also seemed to cause the patient to have diarrhea. She stopped this and tried gaviscon but this gave her constipation. Since stopping these, she has had more coughing. No other complaints are verbalized.   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   Worse in her 34s when she was ill   H/O Hashimoto thyroiditis    History of chemotherapy    History of radiation therapy    History of thyroid  cancer no recurrence   2013--  s/p  left lobe thyroidectomy--  follicular varient papillary / lymphocyctic thyroiditis   Hyperlipidemia  11/09/2015   Hypothyroidism    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HCC) 02/26/2018    PAST SURGICAL HISTORY: Past Surgical History:  Procedure Laterality Date   BIOPSY  02/19/2018   Procedure: BIOPSY;  Surgeon: Golda Claudis PENNER, MD;  [facility];  Service: Endoscopy;;  rectum   COLONOSCOPY N/A 08/20/2015   Procedure: COLONOSCOPY;  Surgeon: Claudis PENNER Golda, MD;  [facility];  Service: Endoscopy;  Laterality: N/A;  730   COLONOSCOPY WITH PROPOFOL  N/A 07/21/2021   Procedure: COLONOSCOPY WITH PROPOFOL ;  Surgeon: Golda Claudis PENNER, MD;  [facility];  Service: Endoscopy;  Laterality: N/A;  10:10   D & C HYSTEROOSCOPY W/ THERMACHOICE ENDOMETRIAL ABLATION  08-13-2004   DIVERTING ILEOSTOMY N/A 12/07/2018   Procedure: DIVERTING LOOP ILEOSTOMY;  Surgeon: Debby Hila, MD;  [facility]: [facility];  Service: General;  Laterality: N/A;   FLEXIBLE SIGMOIDOSCOPY N/A 02/19/2018   Procedure: FLEXIBLE SIGMOIDOSCOPY;  Surgeon: Golda Claudis PENNER, MD;  [facility];  Service: Endoscopy;  Laterality: N/A;   HYSTEROSCOPY WITH D & C N/A  04/30/2015   Procedure: DILATATION AND CURETTAGE / INTENDED HYSTEROSCOPY;  Surgeon: Rosaline Cobble, MD;  Location: [facility];  Service: Gynecology;  Laterality: N/A;   ILEOSTOMY CLOSURE N/A 04/17/2019   Procedure: LOOP ILEOSTOMY REVERSAL;  Surgeon: Debby Hila, MD;  [facility];  Service: General;  Laterality: N/A;   IR US  GUIDE BX ASP/DRAIN  03/09/2018   LAPAROSCOPIC CHOLECYSTECTOMY  04/1996   LAPAROSCOPY N/A 12/11/2018   Procedure: LAPAROSCOPY  ILEOSTOMY REVISION AND ABDOMINAL WASHOUT;  Surgeon: Debby Hila, MD;  [facility];  Service: General;  Laterality: N/A;   Liver Microwave   10/17/2018   POLYPECTOMY  08/20/2015   Procedure: POLYPECTOMY;  Surgeon: Claudis RAYMOND Rivet, MD;  [facility];  Service: Endoscopy;;  Splenic flexure polypectomy   POLYPECTOMY  02/19/2018   Procedure: POLYPECTOMY;  Surgeon: Rivet Claudis RAYMOND, MD;  [facility];  Service: Endoscopy;;  rectum   PORTACATH PLACEMENT N/A 03/14/2018   Procedure: INSERTION PORT-A-CATH;  Surgeon: Mavis Anes, MD;  Location: [facility];  Service: General;  Laterality: N/A;   REDUCTION INCARCERATED UTERUS  06-20-2000   intrauterine preg. 13 wks /  urinary retention   THYROID  LOBECTOMY  11/24/2011   Procedure: THYROID  LOBECTOMY;  Surgeon: Krystal CHRISTELLA Spinner, MD;  Location: [facility];  Service: General;  Laterality: Left;  Left Thyroid  Lobectomy   TUBAL LIGATION  2002   XI ROBOTIC ASSISTED LOWER ANTERIOR RESECTION N/A 12/07/2018   Procedure: XI ROBOTIC ASSISTED LOWER ANTERIOR RESECTION, SPENIC FLEXURE IMMOBILIZATION, COLOANAL ANASTOMOSIS, RIGID PROCTOSCOPY;  Surgeon: Debby Hila, MD;  Location[facility];  Service: General;  Laterality: N/A;    PAST SOCIAL HISTORY:  Social History   Socioeconomic History   Marital status: Married    Spouse name: Not on file   Number of children: 4   Years of education: Not on file   Highest education level: Not on file  Occupational History    Comment: Systems developer at [employer]  Tobacco Use   Smoking status: Former    Current packs/day: 0.00    Average packs/day: 0.3 packs/day for 10.0 years (2.5 ttl pk-yrs)    Types: Cigarettes    Start date: 10/30/1981    Quit date: 10/31/1991    Years since quitting: 32.0   Smokeless tobacco: Never  Vaping Use   Vaping status: Never Used  Substance and Sexual Activity   Alcohol use: No   Drug use: No   Sexual activity: Not Currently  Other Topics Concern   Not on file  Social History Narrative   Lives with husband Anes and 21 year old son Cyndee   Husband is Technical brewer of [employer]   Social Drivers of Health   Financial Resource Strain: Low Risk  (02/26/2018)   Overall Financial Resource Strain (CARDIA)    Difficulty of Paying Living Expenses: Not hard at all  Food Insecurity: No Food Insecurity (06/30/2023)   Hunger Vital Sign    Worried About Running Out of  Food in the Last Year: Never true    Ran Out of Food in the Last Year: Never true  Transportation Needs: No Transportation Needs (06/30/2023)   PRAPARE - Administrator, Civil Service (Medical): No    Lack of Transportation (Non-Medical): No  Physical Activity: Inactive (02/26/2018)   Exercise Vital Sign    Days of Exercise per Week: 0 days    Minutes of Exercise per Session: 0 min  Stress: Stress Concern Present (02/26/2018)  [employer] of Occupational Health - Occupational Stress Questionnaire    Feeling of Stress : To some extent  Social Connections: Socially Integrated (02/26/2018)   Social Connection and Isolation Panel    Frequency of Communication with Friends and Family: More than three times a week    Frequency of Social Gatherings with Friends and Family: Twice a week    Attends Religious Services: More than 4 times per year    Active Member of [employer] or Organizations: Yes    Attends Engineer, structural: More than 4 times per year    Marital Status: Married  Catering manager Violence: Not At Risk (06/30/2023)   Humiliation, Afraid, Rape, and Kick questionnaire    Fear of Current or Ex-Partner: No    Emotionally Abused: No    Physically Abused: No    Sexually Abused: No    PAST FAMILY HISTORY: Family History  Problem Relation Age of Onset   Hypertension Mother    Hypertension Father    Prostate cancer Father    Cancer Maternal Aunt        spine/back   Cancer Paternal Grandfather        lung   Healthy Son    Healthy Son    Healthy Son    Healthy Son    Colon cancer Neg Hx     MEDICATIONS  Current Outpatient Medications  Medication Sig Dispense Refill   ALPRAZolam  (XANAX ) 0.5 MG tablet Take 1 tablet (0.5 mg total) by mouth 3 (three) times daily as needed for anxiety. 90 tablet 3   amitriptyline  (ELAVIL ) 10 MG tablet Take 1 tablet (10 mg total) by mouth 2 (two) times daily. 180 tablet 3   ARMOUR THYROID  60 MG tablet Take 1 tablet (60 mg  total) by mouth daily. 90 tablet 1   loperamide  (IMODIUM ) 2 MG capsule Take 1 capsule (2 mg total) by mouth 3 (three) times daily. (Patient taking differently: Take 2 mg by mouth daily.) 30 capsule 0   Potassium Chloride  10 % SOLN Take 15 ml (20 mEq) by mouth daily. 900 mL 2   prochlorperazine  (COMPAZINE ) 10 MG tablet Take 1 tablet (10 mg total) by mouth every 6 (six) hours as needed for nausea or vomiting. 30 tablet 4   thyroid  (ARMOUR THYROID ) 30 MG tablet Take 1 tablet (30 mg total) by mouth daily before breakfast every Monday. 21 tablet 2   No current facility-administered medications for this encounter.   Facility-Administered Medications Ordered in Other Encounters  Medication Dose Route Frequency Provider Last Rate Last Admin   clindamycin  (CLEOCIN ) 900 mg in dextrose  5 % 50 mL IVPB  900 mg Intravenous 60 min Pre-Op Debby Hila, MD       And   gentamicin  (GARAMYCIN ) 350 mg in dextrose  5 % 50 mL IVPB  5 mg/kg Intravenous 60 min Pre-Op Debby Hila, MD       clindamycin  (CLEOCIN ) 900 mg in dextrose  5 % 50 mL IVPB  900 mg Intravenous 60 min Pre-Op Debby Hila, MD       And   gentamicin  (GARAMYCIN ) 5 mg/kg in dextrose  5 % 50 mL IVPB  5 mg/kg Intravenous 60 min Pre-Op Debby Hila, MD       sodium chloride  0.9 % 1,000 mL with potassium chloride  20 mEq, magnesium  sulfate 2 g infusion   Intravenous Once Katragadda, Sreedhar, MD        ALLERGIES:  Allergies  Allergen Reactions   Oxaliplatin  Other (See Comments)  Patient had hypersensitivity reaction to Oxaliplatin . Patient c/o itching in her ears and eyes. Pt also c/o being hot, flushed, dizzy with redness on pt's face, arms, and legs. Pt c/o tingling in her hands and legs. Pt also had a dry throat with coughing. See progress note from 09/07/22 at 1130. Patient was not able to complete Oxaliplatin  infusion.   Second reaction 10/26/2022. Itching in ears, heart racing, & face flushing. See progress note from 10/26/2022. Able to  complete infusion after medications.   Demerol  [Meperidine ] Itching    All over the body.   Penicillins Hives     childhood, does not remember if it spread all over the body or not. Did it involve swelling of the face/tongue/throat, SOB, or low BP? No Did it involve sudden or severe rash/hives, skin peeling, or any reaction on the inside of your mouth or nose? Yes Did you need to seek medical attention at a hospital or doctor's office? Yes When did it last happen?      childhood allergy If all above answers are "NO", may proceed with cephalosporin use.    Levaquin  [Levofloxacin ] Other (See Comments)    Patient has Aortic Aneurysm and is not indicated with this diagnosis   Other Hives and Other (See Comments)    Fresh coconut    Vancomycin  Rash    Will need Benadryl  prior to administration per IV. Red man syndrome   Physical Exam: Wt Readings from Last 3 Encounters:  10/18/23 129 lb (58.5 kg)  09/13/23 130 lb (59 kg)  08/16/23 124 lb 9.6 oz (56.5 kg)   Temp Readings from Last 3 Encounters:  10/18/23 (!) 97.4 F (36.3 C) (Tympanic)  10/18/23 (!) 97.5 F (36.4 C) (Tympanic)  09/15/23 (!) 97.5 F (36.4 C) (Tympanic)   BP Readings from Last 3 Encounters:  10/20/23 (!) 140/83  10/18/23 121/85  10/18/23 122/86   Pulse Readings from Last 3 Encounters:  10/20/23 93  10/18/23 87  10/18/23 99   Unable to assess due to encounter type   Impression/Plan: 1. Progressive Metastatic Stage IV, [id number], adenocarcinoma of the rectum.  The patient is doing well and tolerating her systemic treatment with 5-FU, leucovorin , and Avastin .  We will plan for repeat MRI in 4 months time. I will ask our team to fuse her recent PET imaging with her prior treatment plans to follow up on her lung findings and I'll communicate after reviewing with Dr. Dewey if he thinks any of these nodules should be treated with radiation or if they are reassuring in sites of prior radiation therapy fields.   2. Hoarseness, and dry cough.  The patient is going to follow up with her PCP and discuss this further since PPI therapy was difficult to tolerate.        This encounter was conducted via telephone.  The patient has provided two factor identification and has given verbal consent for this type of encounter and has been advised to only accept a meeting of this type in a secure network environment. The time spent during this encounter was 45 minutes including preparation, discussion, and coordination of the patient's care. The attendants for this meeting include Donald Estefana Husband  and Elveria VEAR Salt   During the encounter,  Donald Estefana Husband was located at [facility] in the Radiation Oncology Department. MARILU RYLANDER was located at at home.    Donald KYM Husband, PAC

## 2023-11-07 ENCOUNTER — Other Ambulatory Visit: Payer: Self-pay

## 2023-11-08 ENCOUNTER — Telehealth: Payer: Self-pay | Admitting: Radiation Oncology

## 2023-11-08 NOTE — Telephone Encounter (Signed)
 During our call earlier this week the patient asked for Dr. Smitty input on her PET scan that showed hypermetabolic uptake in her left lung measuring about 1.2 cm with SUV of 5.6. She has plans for repeat scan in about one month. He reviewed the imaging and her prior treatment fields which overlap the lesion. For this reason he'd recommend following up with her next scan and if further growth was noted we could consider retreating the area where the nodule is located. She is in agreement with this plan when I called to share his recommendations.

## 2023-11-09 ENCOUNTER — Other Ambulatory Visit: Payer: Self-pay | Admitting: Radiation Therapy

## 2023-11-09 DIAGNOSIS — C7931 Secondary malignant neoplasm of brain: Secondary | ICD-10-CM

## 2023-11-10 ENCOUNTER — Other Ambulatory Visit: Payer: Self-pay

## 2023-11-14 ENCOUNTER — Other Ambulatory Visit: Payer: Self-pay | Admitting: Oncology

## 2023-11-15 ENCOUNTER — Inpatient Hospital Stay: Admitting: Oncology

## 2023-11-15 ENCOUNTER — Encounter: Payer: Self-pay | Admitting: Oncology

## 2023-11-15 ENCOUNTER — Inpatient Hospital Stay: Attending: Hematology

## 2023-11-15 ENCOUNTER — Inpatient Hospital Stay

## 2023-11-15 VITALS — BP 119/76 | HR 87 | Temp 97.7°F | Resp 18

## 2023-11-15 DIAGNOSIS — C20 Malignant neoplasm of rectum: Secondary | ICD-10-CM | POA: Insufficient documentation

## 2023-11-15 DIAGNOSIS — Z5111 Encounter for antineoplastic chemotherapy: Secondary | ICD-10-CM | POA: Diagnosis not present

## 2023-11-15 DIAGNOSIS — D72819 Decreased white blood cell count, unspecified: Secondary | ICD-10-CM | POA: Diagnosis not present

## 2023-11-15 DIAGNOSIS — E876 Hypokalemia: Secondary | ICD-10-CM | POA: Diagnosis not present

## 2023-11-15 DIAGNOSIS — C7931 Secondary malignant neoplasm of brain: Secondary | ICD-10-CM | POA: Diagnosis not present

## 2023-11-15 DIAGNOSIS — Z79899 Other long term (current) drug therapy: Secondary | ICD-10-CM | POA: Diagnosis not present

## 2023-11-15 DIAGNOSIS — Z79631 Long term (current) use of antimetabolite agent: Secondary | ICD-10-CM | POA: Insufficient documentation

## 2023-11-15 DIAGNOSIS — Z5112 Encounter for antineoplastic immunotherapy: Secondary | ICD-10-CM | POA: Diagnosis not present

## 2023-11-15 DIAGNOSIS — C787 Secondary malignant neoplasm of liver and intrahepatic bile duct: Secondary | ICD-10-CM | POA: Diagnosis not present

## 2023-11-15 DIAGNOSIS — D696 Thrombocytopenia, unspecified: Secondary | ICD-10-CM | POA: Insufficient documentation

## 2023-11-15 LAB — COMPREHENSIVE METABOLIC PANEL WITH GFR
ALT: 19 U/L (ref 0–44)
AST: 31 U/L (ref 15–41)
Albumin: 3.7 g/dL (ref 3.5–5.0)
Alkaline Phosphatase: 164 U/L — ABNORMAL HIGH (ref 38–126)
Anion gap: 11 (ref 5–15)
BUN: 13 mg/dL (ref 6–20)
CO2: 22 mmol/L (ref 22–32)
Calcium: 8.8 mg/dL — ABNORMAL LOW (ref 8.9–10.3)
Chloride: 106 mmol/L (ref 98–111)
Creatinine, Ser: 0.78 mg/dL (ref 0.44–1.00)
GFR, Estimated: >60 mL/min (ref >60)
Glucose, Bld: 89 mg/dL (ref 70–99)
Potassium: 3.5 mmol/L (ref 3.5–5.1)
Sodium: 139 mmol/L (ref 135–145)
Total Bilirubin: 3.8 mg/dL — ABNORMAL HIGH (ref 0.0–1.2)
Total Protein: 7 g/dL (ref 6.5–8.1)

## 2023-11-15 LAB — URINALYSIS, DIPSTICK ONLY
Bilirubin Urine: NEGATIVE
Glucose, UA: NEGATIVE mg/dL
Hgb urine dipstick: NEGATIVE
Ketones, ur: NEGATIVE mg/dL
Nitrite: NEGATIVE
Protein, ur: NEGATIVE mg/dL
Specific Gravity, Urine: 1.012 (ref 1.005–1.030)
pH: 6 (ref 5.0–8.0)

## 2023-11-15 LAB — CBC WITH DIFFERENTIAL/PLATELET
Abs Immature Granulocytes: 0 K/uL (ref 0.00–0.07)
Basophils Absolute: 0 K/uL (ref 0.0–0.1)
Basophils Relative: 0 %
Eosinophils Absolute: 0 K/uL (ref 0.0–0.5)
Eosinophils Relative: 1 %
HCT: 33.1 % — ABNORMAL LOW (ref 36.0–46.0)
Hemoglobin: 11.4 g/dL — ABNORMAL LOW (ref 12.0–15.0)
Immature Granulocytes: 0 %
Lymphocytes Relative: 23 %
Lymphs Abs: 0.6 K/uL — ABNORMAL LOW (ref 0.7–4.0)
MCH: 35.3 pg — ABNORMAL HIGH (ref 26.0–34.0)
MCHC: 34.4 g/dL (ref 30.0–36.0)
MCV: 102.5 fL — ABNORMAL HIGH (ref 80.0–100.0)
Monocytes Absolute: 0.3 K/uL (ref 0.1–1.0)
Monocytes Relative: 10 %
Neutro Abs: 1.7 K/uL (ref 1.7–7.7)
Neutrophils Relative %: 66 %
Platelets: 61 K/uL — ABNORMAL LOW (ref 150–400)
RBC: 3.23 MIL/uL — ABNORMAL LOW (ref 3.87–5.11)
RDW: 15.5 % (ref 11.5–15.5)
WBC: 2.5 K/uL — ABNORMAL LOW (ref 4.0–10.5)
nRBC: 0 % (ref 0.0–0.2)

## 2023-11-15 LAB — MAGNESIUM: Magnesium: 2.1 mg/dL (ref 1.7–2.4)

## 2023-11-15 MED ORDER — FLUOROURACIL CHEMO INJECTION 500 MG/10ML
320.0000 mg/m2 | Freq: Once | INTRAVENOUS | Status: AC
  Administered 2023-11-15: 500 mg via INTRAVENOUS
  Filled 2023-11-15: qty 10

## 2023-11-15 MED ORDER — SODIUM CHLORIDE 0.9 % IV SOLN: Freq: Once | INTRAVENOUS | Status: AC

## 2023-11-15 MED ORDER — DEXAMETHASONE SODIUM PHOSPHATE 10 MG/ML IJ SOLN
10.0000 mg | Freq: Once | INTRAMUSCULAR | Status: AC
  Administered 2023-11-15: 10 mg via INTRAVENOUS
  Filled 2023-11-15: qty 1

## 2023-11-15 MED ORDER — SODIUM CHLORIDE 0.9 % IV SOLN
5.0000 mg/kg | Freq: Once | INTRAVENOUS | Status: AC
  Administered 2023-11-15: 300 mg via INTRAVENOUS
  Filled 2023-11-15: qty 12

## 2023-11-15 MED ORDER — PALONOSETRON HCL INJECTION 0.25 MG/5ML
0.2500 mg | Freq: Once | INTRAVENOUS | Status: AC
  Administered 2023-11-15: 0.25 mg via INTRAVENOUS
  Filled 2023-11-15: qty 5

## 2023-11-15 MED ORDER — SODIUM CHLORIDE 0.9 % IV SOLN
1920.0000 mg/m2 | INTRAVENOUS | Status: DC
  Administered 2023-11-15: 3500 mg via INTRAVENOUS
  Filled 2023-11-15: qty 70

## 2023-11-15 MED ORDER — SODIUM CHLORIDE 0.9 % IV SOLN
320.0000 mg/m2 | Freq: Once | INTRAVENOUS | Status: AC
  Administered 2023-11-15: 540 mg via INTRAVENOUS
  Filled 2023-11-15: qty 27

## 2023-11-15 MED ORDER — FAMOTIDINE IN NACL 20-0.9 MG/50ML-% IV SOLN
20.0000 mg | Freq: Once | INTRAVENOUS | Status: AC
  Administered 2023-11-15: 20 mg via INTRAVENOUS
  Filled 2023-11-15: qty 50

## 2023-11-15 NOTE — Progress Notes (Signed)
Patient tolerated chemotherapy with no complaints voiced. Side effects with management reviewed understanding verbalized. Port site clean and dry with no bruising or swelling noted at site. Good blood return noted before and after administration of chemotherapy. Chemo pump connected with no alarms noted. Patient left in satisfactory condition with VSS and no s/s of distress noted.  °

## 2023-11-15 NOTE — Progress Notes (Signed)
 Patients port flushed without difficulty.  Good blood return noted with no bruising or swelling noted at site. Patient remains accessed for treatment.

## 2023-11-15 NOTE — Patient Instructions (Signed)
 [facility] [place] - A DEPT OF [facility]  Discharge Instructions: Thank you for choosing [facility] to provide your oncology and hematology care.  If you have a lab appointment with the Cancer Center - please note that after April 8th, 2024, all labs will be drawn in the cancer center.  You do not have to check in or register with the main entrance as you have in the past but will complete your check-in in the cancer center.  Wear comfortable clothing and clothing appropriate for easy access to any Portacath or PICC line.   We strive to give you quality time with your provider. You may need to reschedule your appointment if you arrive late (15 or more minutes).  Arriving late affects you and other patients whose appointments are after yours.  Also, if you miss three or more appointments without notifying the office, you may be dismissed from the clinic at the provider's discretion.      For prescription refill requests, have your pharmacy contact our office and allow 72 hours for refills to be completed.    Today you received the following MVASI , Leucovorin , and 5FU pump, return as scheduled.   To help prevent nausea and vomiting after your treatment, we encourage you to take your nausea medication as directed.  BELOW ARE SYMPTOMS THAT SHOULD BE REPORTED IMMEDIATELY: *FEVER GREATER THAN 100.4 F (38 C) OR HIGHER *CHILLS OR SWEATING *NAUSEA AND VOMITING THAT IS NOT CONTROLLED WITH YOUR NAUSEA MEDICATION *UNUSUAL SHORTNESS OF BREATH *UNUSUAL BRUISING OR BLEEDING *URINARY PROBLEMS (pain or burning when urinating, or frequent urination) *BOWEL PROBLEMS (unusual diarrhea, constipation, pain near the anus) TENDERNESS IN MOUTH AND THROAT WITH OR WITHOUT PRESENCE OF ULCERS (sore throat, sores in mouth, or a toothache) UNUSUAL RASH, SWELLING OR PAIN  UNUSUAL VAGINAL DISCHARGE OR ITCHING   Items with * indicate a potential emergency and should be followed up  as soon as possible or go to the Emergency Department if any problems should occur.  Please show the CHEMOTHERAPY ALERT CARD or IMMUNOTHERAPY ALERT CARD at check-in to the Emergency Department and triage nurse.  Should you have questions after your visit or need to cancel or reschedule your appointment, please contact [facility] [place] - A DEPT OF [facility] [phone]  and follow the prompts.  Office hours are 8:00 a.m. to 4:30 p.m. Monday - Friday. Please note that voicemails left after 4:00 p.m. may not be returned until the following business day.  We are closed weekends and major holidays. You have access to a nurse at all times for urgent questions. Please call the main number to the clinic [phone] and follow the prompts.  For any non-urgent questions, you may also contact your provider using MyChart. We now offer e-Visits for anyone 67 and older to request care online for non-urgent symptoms. For details visit [email or web address].   Also download the MyChart app! Go to the app store, search MyChart, open the app, select [place], and log in with your MyChart username and password.

## 2023-11-15 NOTE — Progress Notes (Signed)
 Continue with Fluorouracil  CIV dose at 3500 mg - right at 10% difference.  Will evaluate with next cycle.  Niels Molt, PharmD

## 2023-11-15 NOTE — Progress Notes (Signed)
 Labs reviewed with MD today. Platelets are 61 today. Will proceed with treatment as planned per Dr. Davonna.   HR is 106 , proceed with treatment as planned per MD. Total bilirubin is above parameters, ok to treat per MD

## 2023-11-16 LAB — CEA: CEA: 5.8 ng/mL — ABNORMAL HIGH (ref 0.0–4.7)

## 2023-11-17 ENCOUNTER — Inpatient Hospital Stay

## 2023-11-17 NOTE — Progress Notes (Signed)
 Patient presents today for 5FU chemotherapy pump disconnection per provider's order. Vital signs stable and patient voiced no new complaints at this time.   Port flushed easily with 20 mL of normal saline. Good blood return noted and needle removed intact. No bruising or swelling noted at the site.  Discharged from clinic ambulatory in stable condition. Alert and oriented x 3. F/U with [facility] as scheduled.

## 2023-11-17 NOTE — Patient Instructions (Signed)
 [facility] [place] - A DEPT OF [facility]  Discharge Instructions: Thank you for choosing [facility] to provide your oncology and hematology care.  If you have a lab appointment with the Cancer Center - please note that after April 8th, 2024, all labs will be drawn in the cancer center.  You do not have to check in or register with the main entrance as you have in the past but will complete your check-in in the cancer center.  Wear comfortable clothing and clothing appropriate for easy access to any Portacath or PICC line.   We strive to give you quality time with your provider. You may need to reschedule your appointment if you arrive late (15 or more minutes).  Arriving late affects you and other patients whose appointments are after yours.  Also, if you miss three or more appointments without notifying the office, you may be dismissed from the clinic at the provider's discretion.      For prescription refill requests, have your pharmacy contact our office and allow 72 hours for refills to be completed.    Today you received the following chemotherapy and/or immunotherapy agents 5FU pump disconnection.   To help prevent nausea and vomiting after your treatment, we encourage you to take your nausea medication as directed.  BELOW ARE SYMPTOMS THAT SHOULD BE REPORTED IMMEDIATELY: *FEVER GREATER THAN 100.4 F (38 C) OR HIGHER *CHILLS OR SWEATING *NAUSEA AND VOMITING THAT IS NOT CONTROLLED WITH YOUR NAUSEA MEDICATION *UNUSUAL SHORTNESS OF BREATH *UNUSUAL BRUISING OR BLEEDING *URINARY PROBLEMS (pain or burning when urinating, or frequent urination) *BOWEL PROBLEMS (unusual diarrhea, constipation, pain near the anus) TENDERNESS IN MOUTH AND THROAT WITH OR WITHOUT PRESENCE OF ULCERS (sore throat, sores in mouth, or a toothache) UNUSUAL RASH, SWELLING OR PAIN  UNUSUAL VAGINAL DISCHARGE OR ITCHING   Items with * indicate a potential emergency and should be  followed up as soon as possible or go to the Emergency Department if any problems should occur.  Please show the CHEMOTHERAPY ALERT CARD or IMMUNOTHERAPY ALERT CARD at check-in to the Emergency Department and triage nurse.  Should you have questions after your visit or need to cancel or reschedule your appointment, please contact [facility] [place] - A DEPT OF [facility] [phone]  and follow the prompts.  Office hours are 8:00 a.m. to 4:30 p.m. Monday - Friday. Please note that voicemails left after 4:00 p.m. may not be returned until the following business day.  We are closed weekends and major holidays. You have access to a nurse at all times for urgent questions. Please call the main number to the clinic [phone] and follow the prompts.  For any non-urgent questions, you may also contact your provider using MyChart. We now offer e-Visits for anyone 33 and older to request care online for non-urgent symptoms. For details visit [email or web address].   Also download the MyChart app! Go to the app store, search "MyChart", open the app, select [place], and log in with your MyChart username and password.

## 2023-12-01 ENCOUNTER — Encounter: Payer: Self-pay | Admitting: Family Medicine

## 2023-12-01 ENCOUNTER — Other Ambulatory Visit (HOSPITAL_COMMUNITY): Payer: Self-pay

## 2023-12-01 ENCOUNTER — Encounter: Payer: Self-pay | Admitting: Oncology

## 2023-12-01 ENCOUNTER — Other Ambulatory Visit (HOSPITAL_BASED_OUTPATIENT_CLINIC_OR_DEPARTMENT_OTHER): Payer: Self-pay

## 2023-12-01 ENCOUNTER — Encounter (HOSPITAL_COMMUNITY): Payer: Self-pay | Admitting: Oncology

## 2023-12-01 ENCOUNTER — Ambulatory Visit: Admitting: Family Medicine

## 2023-12-01 VITALS — BP 118/72 | HR 107 | Temp 95.5°F | Ht 66.5 in | Wt 125.0 lb

## 2023-12-01 DIAGNOSIS — E063 Autoimmune thyroiditis: Secondary | ICD-10-CM

## 2023-12-01 DIAGNOSIS — R490 Dysphonia: Secondary | ICD-10-CM | POA: Diagnosis not present

## 2023-12-01 DIAGNOSIS — K219 Gastro-esophageal reflux disease without esophagitis: Secondary | ICD-10-CM

## 2023-12-01 DIAGNOSIS — C20 Malignant neoplasm of rectum: Secondary | ICD-10-CM

## 2023-12-01 DIAGNOSIS — E538 Deficiency of other specified B group vitamins: Secondary | ICD-10-CM

## 2023-12-01 MED ORDER — ALPRAZOLAM 0.5 MG PO TABS
0.5000 mg | ORAL_TABLET | Freq: Three times a day (TID) | ORAL | 5 refills | Status: AC | PRN
  Filled 2023-12-01 – 2023-12-18 (×2): qty 90, 30d supply, fill #0
  Filled 2024-01-31: qty 90, 30d supply, fill #1
  Filled 2024-03-05: qty 90, 30d supply, fill #2
  Filled 2024-04-25: qty 90, 30d supply, fill #3

## 2023-12-01 MED ORDER — AMITRIPTYLINE HCL 10 MG PO TABS
10.0000 mg | ORAL_TABLET | Freq: Two times a day (BID) | ORAL | 3 refills | Status: AC
  Filled 2023-12-01: qty 180, 90d supply, fill #0

## 2023-12-01 MED ORDER — BENZONATATE 100 MG PO CAPS
100.0000 mg | ORAL_CAPSULE | Freq: Two times a day (BID) | ORAL | 2 refills | Status: AC | PRN
  Filled 2023-12-01: qty 20, 10d supply, fill #0
  Filled 2024-03-05: qty 20, 10d supply, fill #1
  Filled 2024-03-06: qty 20, 10d supply, fill #0

## 2023-12-01 MED ORDER — ARMOUR THYROID 60 MG PO TABS
60.0000 mg | ORAL_TABLET | Freq: Every day | ORAL | 2 refills | Status: AC
  Filled 2023-12-01 – 2024-04-04 (×2): qty 90, 90d supply, fill #0
  Filled 2024-04-04: qty 90, 90d supply, fill #1

## 2023-12-01 MED ORDER — FAMOTIDINE 40 MG PO TABS
40.0000 mg | ORAL_TABLET | Freq: Every day | ORAL | 1 refills | Status: DC
  Filled 2023-12-01: qty 90, 90d supply, fill #0

## 2023-12-01 MED ORDER — THYROID 30 MG PO TABS
30.0000 mg | ORAL_TABLET | Freq: Every day | ORAL | 5 refills | Status: AC
  Filled 2023-12-01: qty 21, 21d supply, fill #0

## 2023-12-01 NOTE — Progress Notes (Signed)
 Subjective:    Patient ID: Samantha Reynolds, female    DOB: 1965-04-25, 58 y.o.   MRN: [id number]  HPI Discuss thyroid  meds  RefillCarolyn AIDELIZ Reynolds is a 58 year old female who presents with chronic cough and voice changes.  She has been experiencing a chronic cough for several months. Initially, her cough was thought to be due to scarring from radiation therapy, but she reports that Samantha Reynolds, the PA at [facility], told her that her lungs look really good considering her radiation history. She reports that her providers now think the cough may be related to her ongoing chemotherapy, which she has been receiving regularly for over a year. The frequency of her chemotherapy has been adjusted from every two weeks to every four weeks, but she notes that the cumulative effect has been significant.  Approximately six months ago, she began experiencing reflux symptoms, initially managed with Prilosec. She took Prilosec daily for three weeks, which alleviated her reflux symptoms but caused diarrhea, leading her to discontinue it. After stopping Prilosec, the diarrhea resolved, but she began experiencing reflux symptoms again, including a sensation of choking at night. She tried Gaviscon, which caused constipation, so she reduced the dose. Recently, she has been using Pepcid  intermittently, which seems to help.  She reports a persistent tickle on the left side of her throat, which has worsened since her last chemotherapy session two weeks ago. She suspects fluid in her left ear, which worsens in the evening and is accompanied by a headache. She frequently experiences cold and flu-like symptoms, including sneezing and congestion, following chemotherapy.  Her voice has gradually worsened over the past six months, with both quality and volume affected. She experiences variability in her voice, sometimes waking up without a voice, which gradually improves throughout the day but wanes by evening. She has a  history of radiation to the neck area, and she reports that radiation oncology does not think this is related to her current symptoms. She has not seen an ENT specialist for her voice issues.  She experiences fatigue, particularly after chemotherapy, and has noticed a decline in her appetite over the past month, leading to weight loss. She tries to consume high-calorie foods and has experimented with protein supplements, but has not found them palatable.  She takes thyroid  medication regularly, though she sometimes misses doses, particularly around her chemotherapy sessions. She has noticed a decrease in appetite and weight loss over the past month, which she attributes to her recent chemotherapy session exacerbating her symptoms. Past Medical History Copy/Paste - Radiation-induced lung changes - Chemotherapy-induced reflux - Chemotherapy-induced diarrhea - Chemotherapy-induced voice changes - Hypothyroidism  xanax , amitriptyline , thyroid  meds   Review of Systems     Objective:   Physical Exam  General-in no acute distress Eyes-no discharge Lungs-respiratory rate normal, CTA CV-no murmurs,RRR Extremities skin warm dry no edema Neuro grossly normal Behavior normal, alert       Assessment & Plan:  Being treated for cancer Suspected malignancy with rising CEA levels. - CT scan scheduled for September 16. - Coordinate with oncology for management based on CT findings. - Radiation oncology may consider treatment depending on CT results.  Gastroesophageal reflux disease with regurgitation Chronic GERD with regurgitation, worsened recently. Previous treatments caused side effects. Pepcid  shows some improvement. - Prescribe famotidine  40 mg daily. - Advise elevating head of bed with 6-inch blocks. - Monitor symptoms and adjust treatment as needed. - Discussed potential side effects of PPIs and H2 blockers.  Hoarseness  and dysphonia Chronic hoarseness and dysphonia, possibly due  to vocal cord irritation or nerve involvement. - Encourage ENT evaluation if symptoms persist or worsen. - Monitor voice changes and report significant changes.  Cough and postnasal drainage Chronic cough and postnasal drainage, possibly exacerbated by chemotherapy. No signs of sinus infection. - Prescribe Tessalon  Perles for cough. - Monitor symptoms and report changes. - Discussed potential for ENT evaluation if symptoms persist.  Unintentional weight loss and decreased appetite Unintentional weight loss and decreased appetite, likely related to chemotherapy. - Encourage high-calorie, high-protein diet. - Consider protein supplements like whey protein powder. - Monitor weight and nutritional intake.  Fatigue related to chemotherapy Fatigue related to chemotherapy, impacting daily activities.  Hypothyroidism on replacement therapy Hypothyroidism managed with thyroid  replacement. Inconsistent adherence due to fatigue. - Order thyroid  function tests (TSH, free T4, T3). - Encourage consistent medication adherence.  Vitamin B12 deficiency, borderline Borderline vitamin B12 deficiency. - Order vitamin B12 level with blood work. - Consider B12 supplementation if levels are low.  1. Hoarseness (Primary) I am concerned about hoarseness it could be related to the reflux but it certainly also could be dysphonia or even vocal cord and nerve problem I would recommend ENT evaluation patient hesitant to do so she would like to try famotidine  on a daily basis and give us  feedback in 3 to 4 weeks if not doing better she will consider seeing ENT she feels like she has been to so many doctors it is tiring for her  2. Hypothyroidism due to Hashimoto thyroiditis Check lab work continue medication - TSH + free T4 - T3, Free - Vitamin B12  3. Vitamin B 12 deficiency Check lab work if low will need supplementation - Vitamin B12  4. 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HCC) Under active surveillance as  well as treatment she is doing the best she can  5. Gastroesophageal reflux disease without esophagitis Famotidine  daily  She also has a lot of stress and anxiety related to her underlying situation She is also having moderate weight loss are associated with her situation She is doing the best she can  Follow-up within 6 months

## 2023-12-03 ENCOUNTER — Other Ambulatory Visit (HOSPITAL_COMMUNITY): Payer: Self-pay

## 2023-12-03 ENCOUNTER — Encounter (HOSPITAL_COMMUNITY): Payer: Self-pay | Admitting: Oncology

## 2023-12-03 ENCOUNTER — Encounter: Payer: Self-pay | Admitting: Oncology

## 2023-12-04 ENCOUNTER — Other Ambulatory Visit: Payer: Self-pay

## 2023-12-07 ENCOUNTER — Other Ambulatory Visit: Payer: Self-pay

## 2023-12-07 ENCOUNTER — Encounter: Payer: Self-pay | Admitting: Family Medicine

## 2023-12-08 ENCOUNTER — Other Ambulatory Visit: Payer: Self-pay

## 2023-12-12 ENCOUNTER — Ambulatory Visit (HOSPITAL_COMMUNITY)
Admission: RE | Admit: 2023-12-12 | Discharge: 2023-12-12 | Disposition: A | Discharge status: Home / Self Care | Source: Ambulatory Visit | Attending: Hematology | Admitting: Hematology

## 2023-12-12 ENCOUNTER — Ambulatory Visit (HOSPITAL_COMMUNITY): Admission: RE | Admit: 2023-12-12 | Source: Ambulatory Visit

## 2023-12-12 DIAGNOSIS — R911 Solitary pulmonary nodule: Secondary | ICD-10-CM | POA: Insufficient documentation

## 2023-12-12 DIAGNOSIS — C7800 Secondary malignant neoplasm of unspecified lung: Secondary | ICD-10-CM | POA: Diagnosis not present

## 2023-12-12 DIAGNOSIS — C20 Malignant neoplasm of rectum: Secondary | ICD-10-CM | POA: Insufficient documentation

## 2023-12-12 DIAGNOSIS — R161 Splenomegaly, not elsewhere classified: Secondary | ICD-10-CM | POA: Diagnosis not present

## 2023-12-12 MED ORDER — IOHEXOL 300 MG/ML  SOLN
75.0000 mL | Freq: Once | INTRAMUSCULAR | Status: AC | PRN
  Administered 2023-12-12: 75 mL via INTRAVENOUS

## 2023-12-13 ENCOUNTER — Other Ambulatory Visit: Payer: Self-pay | Admitting: Oncology

## 2023-12-13 ENCOUNTER — Inpatient Hospital Stay

## 2023-12-13 ENCOUNTER — Other Ambulatory Visit: Payer: Self-pay

## 2023-12-13 ENCOUNTER — Other Ambulatory Visit: Payer: Self-pay | Admitting: *Deleted

## 2023-12-13 ENCOUNTER — Encounter: Payer: Self-pay | Admitting: Oncology

## 2023-12-13 ENCOUNTER — Inpatient Hospital Stay (HOSPITAL_BASED_OUTPATIENT_CLINIC_OR_DEPARTMENT_OTHER): Admitting: Oncology

## 2023-12-13 ENCOUNTER — Other Ambulatory Visit (HOSPITAL_COMMUNITY): Payer: Self-pay

## 2023-12-13 ENCOUNTER — Inpatient Hospital Stay: Attending: Hematology

## 2023-12-13 VITALS — BP 101/75 | HR 88 | Temp 97.5°F | Resp 18

## 2023-12-13 DIAGNOSIS — Z5112 Encounter for antineoplastic immunotherapy: Secondary | ICD-10-CM | POA: Diagnosis present

## 2023-12-13 DIAGNOSIS — C7802 Secondary malignant neoplasm of left lung: Secondary | ICD-10-CM | POA: Insufficient documentation

## 2023-12-13 DIAGNOSIS — C20 Malignant neoplasm of rectum: Secondary | ICD-10-CM

## 2023-12-13 DIAGNOSIS — R1115 Cyclical vomiting syndrome unrelated to migraine: Secondary | ICD-10-CM | POA: Insufficient documentation

## 2023-12-13 DIAGNOSIS — R112 Nausea with vomiting, unspecified: Secondary | ICD-10-CM | POA: Diagnosis not present

## 2023-12-13 DIAGNOSIS — E860 Dehydration: Secondary | ICD-10-CM | POA: Insufficient documentation

## 2023-12-13 DIAGNOSIS — R490 Dysphonia: Secondary | ICD-10-CM | POA: Diagnosis not present

## 2023-12-13 DIAGNOSIS — Z880 Allergy status to penicillin: Secondary | ICD-10-CM | POA: Insufficient documentation

## 2023-12-13 DIAGNOSIS — Z79890 Hormone replacement therapy (postmenopausal): Secondary | ICD-10-CM | POA: Diagnosis not present

## 2023-12-13 DIAGNOSIS — C7971 Secondary malignant neoplasm of right adrenal gland: Secondary | ICD-10-CM | POA: Diagnosis not present

## 2023-12-13 DIAGNOSIS — Z885 Allergy status to narcotic agent status: Secondary | ICD-10-CM | POA: Insufficient documentation

## 2023-12-13 DIAGNOSIS — C787 Secondary malignant neoplasm of liver and intrahepatic bile duct: Secondary | ICD-10-CM | POA: Diagnosis not present

## 2023-12-13 DIAGNOSIS — Y842 Radiological procedure and radiotherapy as the cause of abnormal reaction of the patient, or of later complication, without mention of misadventure at the time of the procedure: Secondary | ICD-10-CM | POA: Diagnosis not present

## 2023-12-13 DIAGNOSIS — C7931 Secondary malignant neoplasm of brain: Secondary | ICD-10-CM | POA: Insufficient documentation

## 2023-12-13 DIAGNOSIS — I959 Hypotension, unspecified: Secondary | ICD-10-CM | POA: Insufficient documentation

## 2023-12-13 DIAGNOSIS — C779 Secondary and unspecified malignant neoplasm of lymph node, unspecified: Secondary | ICD-10-CM | POA: Insufficient documentation

## 2023-12-13 DIAGNOSIS — K219 Gastro-esophageal reflux disease without esophagitis: Secondary | ICD-10-CM | POA: Diagnosis not present

## 2023-12-13 DIAGNOSIS — R634 Abnormal weight loss: Secondary | ICD-10-CM | POA: Insufficient documentation

## 2023-12-13 DIAGNOSIS — T451X5A Adverse effect of antineoplastic and immunosuppressive drugs, initial encounter: Secondary | ICD-10-CM

## 2023-12-13 DIAGNOSIS — K029 Dental caries, unspecified: Secondary | ICD-10-CM | POA: Insufficient documentation

## 2023-12-13 DIAGNOSIS — Z881 Allergy status to other antibiotic agents status: Secondary | ICD-10-CM | POA: Insufficient documentation

## 2023-12-13 DIAGNOSIS — C7801 Secondary malignant neoplasm of right lung: Secondary | ICD-10-CM | POA: Insufficient documentation

## 2023-12-13 DIAGNOSIS — Z79899 Other long term (current) drug therapy: Secondary | ICD-10-CM | POA: Insufficient documentation

## 2023-12-13 DIAGNOSIS — R053 Chronic cough: Secondary | ICD-10-CM | POA: Diagnosis not present

## 2023-12-13 DIAGNOSIS — Z5111 Encounter for antineoplastic chemotherapy: Secondary | ICD-10-CM | POA: Diagnosis present

## 2023-12-13 DIAGNOSIS — J984 Other disorders of lung: Secondary | ICD-10-CM | POA: Diagnosis not present

## 2023-12-13 DIAGNOSIS — G939 Disorder of brain, unspecified: Secondary | ICD-10-CM | POA: Insufficient documentation

## 2023-12-13 DIAGNOSIS — C7951 Secondary malignant neoplasm of bone: Secondary | ICD-10-CM | POA: Diagnosis not present

## 2023-12-13 LAB — URINALYSIS, DIPSTICK ONLY
Bilirubin Urine: NEGATIVE
Glucose, UA: NEGATIVE mg/dL
Hgb urine dipstick: NEGATIVE
Ketones, ur: NEGATIVE mg/dL
Nitrite: NEGATIVE
Protein, ur: NEGATIVE mg/dL
Specific Gravity, Urine: 1.019 (ref 1.005–1.030)
pH: 5 (ref 5.0–8.0)

## 2023-12-13 LAB — COMPREHENSIVE METABOLIC PANEL WITH GFR
ALT: 12 U/L (ref 0–44)
AST: 23 U/L (ref 15–41)
Albumin: 3.4 g/dL — ABNORMAL LOW (ref 3.5–5.0)
Alkaline Phosphatase: 178 U/L — ABNORMAL HIGH (ref 38–126)
Anion gap: 14 (ref 5–15)
BUN: 14 mg/dL (ref 6–20)
CO2: 21 mmol/L — ABNORMAL LOW (ref 22–32)
Calcium: 8.7 mg/dL — ABNORMAL LOW (ref 8.9–10.3)
Chloride: 102 mmol/L (ref 98–111)
Creatinine, Ser: 0.75 mg/dL (ref 0.44–1.00)
GFR, Estimated: >60 mL/min (ref >60)
Glucose, Bld: 99 mg/dL (ref 70–99)
Potassium: 3.6 mmol/L (ref 3.5–5.1)
Sodium: 137 mmol/L (ref 135–145)
Total Bilirubin: 2.3 mg/dL — ABNORMAL HIGH (ref 0.0–1.2)
Total Protein: 7.1 g/dL (ref 6.5–8.1)

## 2023-12-13 LAB — CBC WITH DIFFERENTIAL/PLATELET
Abs Immature Granulocytes: 0.02 K/uL (ref 0.00–0.07)
Basophils Absolute: 0 K/uL (ref 0.0–0.1)
Basophils Relative: 0 %
Eosinophils Absolute: 0 K/uL (ref 0.0–0.5)
Eosinophils Relative: 1 %
HCT: 31.2 % — ABNORMAL LOW (ref 36.0–46.0)
Hemoglobin: 10.8 g/dL — ABNORMAL LOW (ref 12.0–15.0)
Immature Granulocytes: 1 %
Lymphocytes Relative: 15 %
Lymphs Abs: 0.5 K/uL — ABNORMAL LOW (ref 0.7–4.0)
MCH: 35.8 pg — ABNORMAL HIGH (ref 26.0–34.0)
MCHC: 34.6 g/dL (ref 30.0–36.0)
MCV: 103.3 fL — ABNORMAL HIGH (ref 80.0–100.0)
Monocytes Absolute: 0.3 K/uL (ref 0.1–1.0)
Monocytes Relative: 8 %
Neutro Abs: 2.6 K/uL (ref 1.7–7.7)
Neutrophils Relative %: 75 %
Platelets: 112 K/uL — ABNORMAL LOW (ref 150–400)
RBC: 3.02 MIL/uL — ABNORMAL LOW (ref 3.87–5.11)
RDW: 16.3 % — ABNORMAL HIGH (ref 11.5–15.5)
WBC: 3.5 K/uL — ABNORMAL LOW (ref 4.0–10.5)
nRBC: 0 % (ref 0.0–0.2)

## 2023-12-13 LAB — MAGNESIUM: Magnesium: 2.3 mg/dL (ref 1.7–2.4)

## 2023-12-13 MED ORDER — SODIUM CHLORIDE 0.9 % IV SOLN: INTRAVENOUS | Status: DC

## 2023-12-13 MED ORDER — PANTOPRAZOLE SODIUM 40 MG PO TBEC
40.0000 mg | DELAYED_RELEASE_TABLET | Freq: Every day | ORAL | 3 refills | Status: DC
  Filled 2023-12-13: qty 30, 30d supply, fill #0

## 2023-12-13 NOTE — Progress Notes (Signed)
 Patient presents today for Leucovorin  and 5FU pump, Avastin . Message received from A.Lenon RN and Dr. Davonna treatment held today. Give 1 liter of normal saline over 1 hour.  1 liter of normal saline given today per MD orders. Tolerated infusion without adverse affects. Vital signs stable. No complaints at this time. Discharged from clinic ambulatory in stable condition. Alert and oriented x 3. F/U with [facility] as scheduled.

## 2023-12-13 NOTE — Patient Instructions (Signed)
 [facility] [place] - A DEPT OF [facility]  Discharge Instructions: Thank you for choosing [facility] to provide your oncology and hematology care.  If you have a lab appointment with the Cancer Center - please note that after April 8th, 2024, all labs will be drawn in the cancer center.  You do not have to check in or register with the main entrance as you have in the past but will complete your check-in in the cancer center.  Wear comfortable clothing and clothing appropriate for easy access to any Portacath or PICC line.   We strive to give you quality time with your provider. You may need to reschedule your appointment if you arrive late (15 or more minutes).  Arriving late affects you and other patients whose appointments are after yours.  Also, if you miss three or more appointments without notifying the office, you may be dismissed from the clinic at the provider's discretion.      For prescription refill requests, have your pharmacy contact our office and allow 72 hours for refills to be completed.    Today you received the following chemotherapy and/or immunotherapy agents IV fluids for hydration.        To help prevent nausea and vomiting after your treatment, we encourage you to take your nausea medication as directed.  BELOW ARE SYMPTOMS THAT SHOULD BE REPORTED IMMEDIATELY: *FEVER GREATER THAN 100.4 F (38 C) OR HIGHER *CHILLS OR SWEATING *NAUSEA AND VOMITING THAT IS NOT CONTROLLED WITH YOUR NAUSEA MEDICATION *UNUSUAL SHORTNESS OF BREATH *UNUSUAL BRUISING OR BLEEDING *URINARY PROBLEMS (pain or burning when urinating, or frequent urination) *BOWEL PROBLEMS (unusual diarrhea, constipation, pain near the anus) TENDERNESS IN MOUTH AND THROAT WITH OR WITHOUT PRESENCE OF ULCERS (sore throat, sores in mouth, or a toothache) UNUSUAL RASH, SWELLING OR PAIN  UNUSUAL VAGINAL DISCHARGE OR ITCHING   Items with * indicate a potential emergency and  should be followed up as soon as possible or go to the Emergency Department if any problems should occur.  Please show the CHEMOTHERAPY ALERT CARD or IMMUNOTHERAPY ALERT CARD at check-in to the Emergency Department and triage nurse.  Should you have questions after your visit or need to cancel or reschedule your appointment, please contact [facility] [place] - A DEPT OF [facility] [phone]  and follow the prompts.  Office hours are 8:00 a.m. to 4:30 p.m. Monday - Friday. Please note that voicemails left after 4:00 p.m. may not be returned until the following business day.  We are closed weekends and major holidays. You have access to a nurse at all times for urgent questions. Please call the main number to the clinic [phone] and follow the prompts.  For any non-urgent questions, you may also contact your provider using MyChart. We now offer e-Visits for anyone 10 and older to request care online for non-urgent symptoms. For details visit [email or web address].   Also download the MyChart app! Go to the app store, search MyChart, open the app, select [place], and log in with your MyChart username and password.

## 2023-12-13 NOTE — Progress Notes (Signed)
 Patient Care Team: Alphonsa Glendia LABOR, MD as PCP - General (Family Medicine)  Clinic Day:  12/13/2023  Referring physician: Alphonsa Glendia LABOR, MD   CHIEF COMPLAINT:  CC: Metastatic rectal carcinoma  Samantha Reynolds Salt 58 y.o. female was transferred to my care after her prior physician has left.   ASSESSMENT & PLAN:   Assessment & Plan: Samantha Reynolds  is a 58 y.o. 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  Initially diagnosed in 2019.  Extensive oncology history below NGS: KRAS/NRAS: Wild-type, BRAF: Negative Patient had chemotherapy with FOLFOX and panitumumab .  S/p APR with aggravating ileostomy. Multiple SBRT to liver, lung and C6 vertebral lesion Also had brain metastasis for which she underwent SRS Recent CEA uptrending.  Increasing lung nodule on PET scan.  - Patient has a CT scan done yesterday which is pending at this time.  If the CT scan shows increasing lung nodule, will get in touch with Dr. Dewey to see if this can be radiated. - Uptrending CEA likely consistent with enlarging lung nodule - Patient feels significantly unwell from nausea and vomiting after the prior chemotherapy and had to take off of work for URTI.  Will hold chemotherapy today and will schedule for next week. - Will continue PET scans every 3 months - Will continue MRI brain every 3 months - If SBRT is not an option for the patient for the lung nodule, will add oxaliplatin  to her maintenance 5-FU and bevacizumab   Return to clinic in 1 week for reassessment  Chemotherapy induced nausea and vomiting Persistent nausea and vomiting post-chemotherapy causing dehydration and poor appetite.  - Administer IV fluids for dehydration. - Reassess nausea and vomiting next week before resuming chemotherapy.  Dehydration Signs of dehydration due to inadequate fluid intake from nausea  and vomiting. Lower blood pressure and higher heart rate indicate possible dehydration.  - Administer IV fluids for dehydration. Gastroesophageal reflux disease, unspecified whether esophagitis present Chronic cough and hoarseness potentially related to GERD.  Pepcid  discontinued due to side effects. Pantoprazole  considered.  ENT referral recommended for hoarseness evaluation.  - Prescribe pantoprazole  for GERD. - Refer to ENT for hoarseness evaluation. Dental caries Requires crown and three fillings. No significant bleeding risk expected with the procedure, but caution advised due to Avastin .  - Proceed with dental procedures with caution regarding bleeding risk.    The patient understands the plans discussed today and is in agreement with them.  She knows to contact our office if she develops concerns prior to her next appointment.  120 minutes of total time was spent for this patient encounter, including preparation,review of records,  face-to-face counseling with the patient and coordination of care, physical exam, and documentation of the encounter.   [name], MD  [facility]  - A DEPT OF [facility] [address] [place] [address] Dept: [phone] Dept Fax: [phone]   No orders of the defined types were placed in this encounter.    ONCOLOGY HISTORY:   I have reviewed her chart and materials related to her cancer extensively and collaborated history with the patient. Summary of oncologic history is as follows:   Stage IV rectal adenocarcinoma with liver metastasis:  -Presentation with intermittent rectal bleeding and weight loss of 20lbs in 3 months -02/19/2018: Flexible sigmoidoscopy: Rectal mass 2 to 4 cm from anal verge. The  mass was noncircumferential and located predominantly at the posterior bowel wall.   -02/19/2018:Rectum biopsy: Adenocarcinoma -02/21/2018: MR pelvis: Rectal  adenocarcinoma: T3, N1 equivocal N2 nodes. -02/21/2018: CT CAP: Rectal wall thickening, likely representing the primary lesion.  Perirectal nodes of maximally 7 mm, suspicious for nodal metastasis.  Isolated 9 mm left lower lobe pulmonary nodule.  Right hepatic lobe hypoattenuation, including central more nodular component and peripheral more wedge-shaped component. -03/05/2018: PET scan: Hypermetabolic rectal mass consistent with known neoplasm.  Small but hypermetabolic left perirectal and sigmoid mesocolon lymph nodes.  Single hepatic metastatic lesion in the right hepatic lobe.  7 mm pulmonary nodule at the left lung base is suspicious for metastasis. -03/08/2018: MRI liver: 1.5 cm lesion in the posterior right hepatic lobe, highly concerning for metastatic disease. -03/14/2018-06/27/2018: FOLFOX + Panitumumab  (7 cycles). Panitumumab  (cycles 3-6) discontinued after 6 cycles for severe rash.  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  Persistent but significantly improved radiotracer uptake associated with rectal neoplasm. -06/25/2018: MR liver: Hepatic metastasis in the right hepatic lobe with decreased significantly in size and demonstrate no perceptible enhancement. -07/20/2018-09/06/2018: Chemo RT to rectum/pelvis with Capecitabine  -09/27/2018: CT CAP: Stable post treatment findings of rectal neoplasm, metastatic liver lesion and presumed metastatic left lung nodule- stable. No evidence of new metastatic disease in the chest, abdomen, or pelvis. -10/17/2018: Right liver lesion microwave ablation  -12/07/2018: Low anterior resection and diverting ileostomy , Colon  resection:  - Invasive adenocarcinoma, well-differentiated, scattered foci over 2.0 cm span - Adenocarcinoma extends into but not through the muscularis propria - The surgical resection margins are negative for carcinoma - No evidence of carcinoma in 0/4 lymph nodes -ypT2, ypN0. -p-MMR -12/11/2018: CT CAP: Postoperative changes from low APR with colo anal anastomosis and right lower quadrant ileostomy.Previously treated liver metastases within posterior right lobe of liver appears increased in size.  -12/2018-03/2019: CT CAP: Stable -07/10/2019: CT CAP: A left lower lobe pulmonary nodule is felt to have enlarged.  New inferior right hepatic lobe hypoattenuating lesion, most consistent with metastatic disease. -07/16/2019: MRI abdomen: Two enhancing lesions in the right hepatic lobe seen on recent CT are consistent with metastatic disease.No other evidence of metastatic disease in the abdomen.  -08/15/2019: Microwave ablation of the liver lesion -10/21/2019: CT CAP: Pulmonary nodules in the chest, largest in the LEFT lower lobe has enlarged since the prior study. Interval microwave ablation of lesions in the RIGHT hepatic lobe with NEW LEFT hepatic lobe lesion compatible with additional site of metastasis. -11/05/2019: MR liver: Isolated left hepatic metastasis, as on prior CT. Treated metastasis within the right hepatic lobe, without residual or recurrent disease. -11/15/2019-11/22/2019: SBRT to lung lesion -12/04/2019-12/13/2019: SBRT to liver lesion -01/22/2020: CT CAP: Evaluation of a new lesion of the inferior left lobe of the liver, previously better characterized by MR, is limited by the placement of adjacent metallic fiducial marking clips, however this lesion does appear to have somewhat diminished and decreased in density compared to prior examinations, consistent with treatment response to radiation therapy.Redemonstrated nonenhancing low-attenuation ablation sites of the inferior right  lobe of the liver, hepatic segment VI and posterior liver dome, hepatic segment VII. No evidence of local recurrence at these sites. Interval enlargement of a nodule of the medial right  pulmonary apex. Interval decrease in size of an irregular nodule of the dependent left lung base. -04/01/2020:CT CAP: Stable exam -07/01/2020: CT CAP: Interval enlargement of a pulmonary nodule of the medial right lung apex measuring 0.9 x 0.7 cm, previously 0.7 x 0.5 cm, highly suspicious for pulmonary metastatic disease.Interval increase in somewhat nodular consolidation of the dependent medial left lung base, measuring approximately 2.6 x 1.1 cm.  -08/06/2020-08/13/2020: SBRT to RUL lung lesion -11/06/2020: CT CAP: Stable disease -05/12/2021: CT CAP: Progression in pulmonary metastatic disease, namely in the left lower lobe. Likely associated increase in size of a subcarinal lymph node. -08/09/2021: CT chest: Enlarging LEFT lower lobe pulmonary nodule and tubular and irregular areas of nodularity and discrete nodularity at the medial aspect of the RIGHT lower lobe. Findings suspicious for worsening of disease and endobronchial dissemination of tumor. Decreasing size of RIGHT hepatic lobe metastasis.  -08/19/2021: PET scan: Multifocal left lung nodules, compatible with metastases. Radiation changes in the right lung apex. Associated subcarinal nodal metastasis. Treated metastasis in the posterior right hepatic lobe. -09/11/2021-10/05/2021: SBRT left lung lesion -01/06/2022: CT CAP:  Interval response to therapy with decreased size of the dominant left infrahilar pulmonary nodule and subcarinal lymph node. New part solid subpleural density in the left lower lobe is likely related to interval therapy. The treated right upper lobe lesion is unchanged. Slowly enlarging solid nodule posteriorly in the left upper lobe, suspicious for a metastasis. No other suspicious nodules. The treated hepatic metastases are stable to  decreased in size. No new or enlarging lesions. No evidence of progressive metastatic disease in the abdomen or pelvis. -05/26/2022: PET: Hypermetabolic LEFT upper lobe nodule along the fissures concerning for pulmonary metastasis. Interval resolution of hypermetabolic metastasis in the LEFT lower lobe, hilum, and mediastinum.There is evidence of NEW METASTATIC DISEASE to the RIGHT adrenal gland and recurrent metastatic malignancy in the LEFT hepatic lobe of the liver. New activity in this C6 vertebral body is consistent with new skeletal metastasis. - 06/09/2022: Liver, biopsy: - Metastatic moderately differentiated colonic adenocarcinoma -06/27/2022: [facility] NGS: KRAS/NRAS: Not detected, BRAF: Not detected.  MSI-stable, P-MMR, TMB: Low, 3mut/Mb.  - NTRK 1/2/3, RET, BRAF, eGFR, ERBB2, NF1, PD-L1, PIK 3 CA, POLE, PTEN: Negative -07/01/2022: MRI cervical spine: Osseous metastatic disease in the C6 vertebral body extending to the right posterior elements with extraosseous tumor along the posterior endplate and extending into the right C5-C6 and C6-C7 neural foramina, likely affecting the exiting C6 and C7 nerve roots. -07/06/2022-10/26/2022: FOLFOX + Bevacizumab .  -07/14/2022-08/03/2022: XRT to C6 vertebral lesion -08/18/2022: MRI brain:  3 mm nodular enhancing lesion within the anterior right frontal lobe white matter consistent with a metastasis. 4 mm enhancing focus within the left frontal calvarium -08/31/2022: SRS to the brain lesion -11/03/2022: PET scan: Overall improvement in most of the metastatic areas -11/09/2022-Current: Maintenance 5FU + Bevacizumab   -11/2022-04/17/2023: PET scan every 3 months with no evidence of new disease -11/30/2022-Current: MRI brain every 3 months: NED -09/07/2023: PET: Decreasing low-level uptake along the rectum with surgical changes. Increased size and uptake of a nodule in the superior segment left lower lobe medially worrisome for a developing lung metastasis.  Other areas of lung nodularity with low-level uptake are stable including some ill-defined areas in the left lower lobe and right lung apex and more defined area in the left upper lobe.   Current Treatment:  Maintenance 5FU + Bevacizumab    INTERVAL HISTORY:   KATHLEE BARNHARDT is here today for follow up and to establish  care with me for metastatic rectal carcinoma. Patient is accompanied by her husband today.   She has been experiencing persistent nausea since her last chemotherapy session, which typically resolves within a week to a week and a half. This time, the nausea has been constant, leading to a significant decrease in appetite and a weight loss of about ten pounds. She has 'no appetite' and has been unable to eat much.  She describes a persistent cough, which has worsened since stopping Pepcid . The cough is particularly troublesome at night, requiring her to sleep sitting up to avoid exacerbating the symptoms. The cough wakes her up when she lies down.  She was previously prescribed Pepcid  40 mg for suspected reflux, which initially helped but subsequently caused significant gastrointestinal side effects, including 'a whole lot of gas' and slow digestion, leading her to discontinue the medication. She has not taken Pepcid  since Sunday and is awaiting further management.  She reports a new onset of hoarseness, which is gradually worsening. She attributes this to the persistent cough and reflux. She recently visited a dentist for the first time since her cancer diagnosis and requires a crown and three fillings. She inquired about bleeding risks due to her current medication, Avastin , but was informed that the planned dental procedures are not expected to cause significant bleeding.  She mentions a recent episode of low-grade fever and nasal discharge, which she describes as 'pouring from my nose' without any color change, suggesting a possible viral infection. This episode led her to miss two  days of work, which is unusual for her.   I have reviewed the past medical history, past surgical history, social history and family history with the patient and they are unchanged from previous note.  ALLERGIES:  is allergic to oxaliplatin , demerol  [meperidine ], penicillins, levaquin  [levofloxacin ], other, and vancomycin .  MEDICATIONS:  Current Outpatient Medications  Medication Sig Dispense Refill   ALPRAZolam  (XANAX ) 0.5 MG tablet Take 1 tablet (0.5 mg total) by mouth 3 (three) times daily as needed for anxiety. 90 tablet 5   amitriptyline  (ELAVIL ) 10 MG tablet Take 1 tablet (10 mg total) by mouth 2 (two) times daily. 180 tablet 3   ARMOUR THYROID  60 MG tablet Take 1 tablet (60 mg total) by mouth daily. 90 tablet 2   benzonatate  (TESSALON ) 100 MG capsule Take 1 capsule (100 mg total) by mouth 2 (two) times daily as needed for cough. 20 capsule 2   famotidine  (PEPCID ) 40 MG tablet Take 1 tablet (40 mg total) by mouth daily. 90 tablet 1   loperamide  (IMODIUM ) 2 MG capsule Take 1 capsule (2 mg total) by mouth 3 (three) times daily. (Patient taking differently: Take 2 mg by mouth daily.) 30 capsule 0   prochlorperazine  (COMPAZINE ) 10 MG tablet Take 1 tablet (10 mg total) by mouth every 6 (six) hours as needed for nausea or vomiting. 30 tablet 4   thyroid  (ARMOUR THYROID ) 30 MG tablet Take 1 tablet (30 mg total) by mouth daily before breakfast every Monday. 21 tablet 5   No current facility-administered medications for this visit.   Facility-Administered Medications Ordered in Other Visits  Medication Dose Route Frequency Provider Last Rate Last Admin   clindamycin  (CLEOCIN ) 900 mg in dextrose  5 % 50 mL IVPB  900 mg Intravenous 60 min Pre-Op Debby Hila, MD       And   gentamicin  (GARAMYCIN ) 350 mg in dextrose  5 % 50 mL IVPB  5 mg/kg Intravenous 60 min Pre-Op Debby Hila, MD  clindamycin  (CLEOCIN ) 900 mg in dextrose  5 % 50 mL IVPB  900 mg Intravenous 60 min Pre-Op Debby Hila,  MD       And   gentamicin  (GARAMYCIN ) 5 mg/kg in dextrose  5 % 50 mL IVPB  5 mg/kg Intravenous 60 min Pre-Op Debby Hila, MD       sodium chloride  0.9 % 1,000 mL with potassium chloride  20 mEq, magnesium  sulfate 2 g infusion   Intravenous Once Rogers Hai, MD        REVIEW OF SYSTEMS:   Constitutional: Denies fevers, chills or abnormal weight loss Eyes: Denies blurriness of vision Ears, nose, mouth, throat, and face: Denies mucositis or sore throat Respiratory: Denies cough, dyspnea or wheezes Cardiovascular: Denies palpitation, chest discomfort or lower extremity swelling Gastrointestinal:  Denies nausea, heartburn or change in bowel habits Skin: Denies abnormal skin rashes Lymphatics: Denies new lymphadenopathy or easy bruising Neurological:Denies numbness, tingling or new weaknesses Behavioral/Psych: Mood is stable, no new changes  All other systems were reviewed with the patient and are negative.   VITALS:  Last menstrual period 02/16/2015.  Wt Readings from Last 3 Encounters:  12/13/23 124 lb 9.6 oz (56.5 kg)  12/01/23 125 lb (56.7 kg)  11/15/23 126 lb 8.7 oz (57.4 kg)    There is no height or weight on file to calculate BMI.  Performance status (ECOG): 1 - Symptomatic but completely ambulatory  PHYSICAL EXAM:   GENERAL:alert, no distress and comfortable SKIN: skin color, texture, turgor are normal, no rashes or significant lesions LYMPH:  no palpable lymphadenopathy in the cervical, axillary or inguinal LUNGS: Wheezing bilaterally on ascultation HEART: regular rate & rhythm and no murmurs and no lower extremity edema ABDOMEN:abdomen soft, non-tender and normal bowel sounds Musculoskeletal:no cyanosis of digits and no clubbing  NEURO: alert & oriented x 3 with fluent speech, no focal motor/sensory deficits  LABORATORY DATA:  I have reviewed the data as listed   Lab Results  Component Value Date   WBC 3.5 (L) 12/13/2023   NEUTROABS 2.6 12/13/2023    HGB 10.8 (L) 12/13/2023   HCT 31.2 (L) 12/13/2023   MCV 103.3 (H) 12/13/2023   PLT 112 (L) 12/13/2023      Chemistry      Component Value Date/Time   NA 139 11/15/2023 0838   NA 140 11/03/2015 0841   K 3.5 11/15/2023 0838   CL 106 11/15/2023 0838   CO2 22 11/15/2023 0838   BUN 13 11/15/2023 0838   BUN 10 11/03/2015 0841   CREATININE 0.78 11/15/2023 0838      Component Value Date/Time   CALCIUM  8.8 (L) 11/15/2023 0838   ALKPHOS 164 (H) 11/15/2023 0838   AST 31 11/15/2023 0838   ALT 19 11/15/2023 0838   BILITOT 3.8 (H) 11/15/2023 0838   BILITOT 2.1 (H) 11/03/2015 0841        Latest Reference Range & Units 12/13/23 08:55  CEA 0.0 - 4.7 ng/mL 6.2 (H)  (H): Data is abnormally high  RADIOGRAPHIC STUDIES: I have personally reviewed the radiological images as listed and agreed with the findings in the report.  MR Brain W Wo Contrast EXAM: MRI BRAIN WITH AND WITHOUT CONTRAST 11/01/2023 09:16:00 AM  TECHNIQUE: Multiplanar multisequence MRI of the head/brain was performed with and without the administration of intravenous contrast.  COMPARISON: MRI of the head dated 07/18/2023.  CLINICAL HISTORY: Brain metastases, assess treatment response; 3T SRS Protocol.  FINDINGS:  BRAIN AND VENTRICLES: No acute infarct. No acute intracranial hemorrhage. No mass  effect or midline shift. No hydrocephalus. The sella is unremarkable. Normal flow voids. No evidence of metastatic disease to the brain. Numerous foci of increased T2 signal are demonstrated within the subcortical white matter bilaterally, primarily involving the frontal lobes.  ORBITS: No acute abnormality.  SINUSES: No acute abnormality.  BONES AND SOFT TISSUES: There is a stable small focal area of contrast enhancement within the left frontal bone along the inner table of the skull. The calvaria otherwise remains unremarkable.  IMPRESSION: 1. No evidence of metastatic disease to the brain. 2. Stable small  focal area of contrast enhancement within the left frontal bone along the inner table of the skull.  Electronically signed by: evalene coho 11/01/2023 11:13 AM EDT RP Workstation: [id number]

## 2023-12-13 NOTE — Patient Instructions (Signed)
 [facility] at [facility] Discharge Instructions   You were seen and examined today by Dr. Davonna.  She reviewed the results of your lab work which are normal/stable.   We will hold your treatment today. We will give you IV fluids today.    Return as scheduled.    Thank you for choosing [facility] at [facility] to provide your oncology and hematology care.  To afford each patient quality time with our provider, please arrive at least 15 minutes before your scheduled appointment time.   If you have a lab appointment with the Cancer Center please come in thru the Main Entrance and check in at the main information desk.  You need to re-schedule your appointment should you arrive 10 or more minutes late.  We strive to give you quality time with our providers, and arriving late affects you and other patients whose appointments are after yours.  Also, if you no show three or more times for appointments you may be dismissed from the clinic at the providers discretion.     Again, thank you for choosing [facility].  Our hope is that these requests will decrease the amount of time that you wait before being seen by our physicians.       _____________________________________________________________  Should you have questions after your visit to [facility], please contact our office at [phone] and follow the prompts.  Our office hours are 8:00 a.m. and 4:30 p.m. Monday - Friday.  Please note that voicemails left after 4:00 p.m. may not be returned until the following business day.  We are closed weekends and major holidays.  You do have access to a nurse 24-7, just call the main number to the clinic [phone] and do not press any options, hold on the line and a nurse will answer the phone.    For prescription refill requests, have your pharmacy contact our office and allow 72 hours.    Due to Covid, you will need to  wear a mask upon entering the hospital. If you do not have a mask, a mask will be given to you at the Main Entrance upon arrival. For doctor visits, patients may have 1 support person age 54 or older with them. For treatment visits, patients can not have anyone with them due to social distancing guidelines and our immunocompromised population.

## 2023-12-14 ENCOUNTER — Encounter: Payer: Self-pay | Admitting: Family Medicine

## 2023-12-14 DIAGNOSIS — R490 Dysphonia: Secondary | ICD-10-CM

## 2023-12-14 LAB — CEA: CEA: 6.2 ng/mL — ABNORMAL HIGH (ref 0.0–4.7)

## 2023-12-15 ENCOUNTER — Inpatient Hospital Stay

## 2023-12-15 NOTE — Telephone Encounter (Signed)
 Nurses-please put in ENT referral for hoarseness-please have the ENT office reach out to the patient to coordinate the scheduling of the office visit to try to coincide around her chemotherapy schedule  [facility] E[facility] Dr. Tobie or [facility]

## 2023-12-17 ENCOUNTER — Encounter (HOSPITAL_COMMUNITY): Payer: Self-pay | Admitting: Oncology

## 2023-12-17 ENCOUNTER — Encounter: Payer: Self-pay | Admitting: Oncology

## 2023-12-17 DIAGNOSIS — E860 Dehydration: Secondary | ICD-10-CM | POA: Insufficient documentation

## 2023-12-17 DIAGNOSIS — K029 Dental caries, unspecified: Secondary | ICD-10-CM | POA: Insufficient documentation

## 2023-12-17 DIAGNOSIS — T451X5A Adverse effect of antineoplastic and immunosuppressive drugs, initial encounter: Secondary | ICD-10-CM | POA: Insufficient documentation

## 2023-12-17 NOTE — Assessment & Plan Note (Addendum)
 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  Initially diagnosed in 2019.  Extensive oncology history below NGS: KRAS/NRAS: Wild-type, BRAF: Negative Patient had chemotherapy with FOLFOX and panitumumab .  S/p APR with aggravating ileostomy. Multiple SBRT to liver, lung and C6 vertebral lesion Also had brain metastasis for which she underwent SRS Recent CEA uptrending.  Increasing lung nodule on PET scan.  - Patient has a CT scan done yesterday which is pending at this time.  If the CT scan shows increasing lung nodule, will get in touch with Dr. Dewey to see if this can be radiated. - Uptrending CEA likely consistent with enlarging lung nodule - Patient feels significantly unwell from nausea and vomiting after the prior chemotherapy and had to take off of work for URTI.  Will hold chemotherapy today and will schedule for next week. - Will continue PET scans every 3 months - Will continue MRI brain every 3 months - If SBRT is not an option for the patient for the lung nodule, will add oxaliplatin  to her maintenance 5-FU and bevacizumab   Return to clinic in 1 week for reassessment

## 2023-12-17 NOTE — Assessment & Plan Note (Addendum)
 Persistent nausea and vomiting post-chemotherapy causing dehydration and poor appetite.  - Administer IV fluids for dehydration. - Reassess nausea and vomiting next week before resuming chemotherapy.

## 2023-12-17 NOTE — Assessment & Plan Note (Addendum)
 Requires crown and three fillings. No significant bleeding risk expected with the procedure, but caution advised due to Avastin .  - Proceed with dental procedures with caution regarding bleeding risk.

## 2023-12-17 NOTE — Assessment & Plan Note (Addendum)
 Signs of dehydration due to inadequate fluid intake from nausea and vomiting. Lower blood pressure and higher heart rate indicate possible dehydration.  - Administer IV fluids for dehydration.

## 2023-12-17 NOTE — Assessment & Plan Note (Addendum)
 Chronic cough and hoarseness potentially related to GERD.  Pepcid  discontinued due to side effects. Pantoprazole  considered.  ENT referral recommended for hoarseness evaluation.  - Prescribe pantoprazole  for GERD. - Refer to ENT for hoarseness evaluation.

## 2023-12-18 ENCOUNTER — Other Ambulatory Visit (HOSPITAL_COMMUNITY): Payer: Self-pay

## 2023-12-20 ENCOUNTER — Inpatient Hospital Stay (HOSPITAL_BASED_OUTPATIENT_CLINIC_OR_DEPARTMENT_OTHER): Admitting: Oncology

## 2023-12-20 ENCOUNTER — Other Ambulatory Visit (HOSPITAL_BASED_OUTPATIENT_CLINIC_OR_DEPARTMENT_OTHER): Payer: Self-pay

## 2023-12-20 ENCOUNTER — Inpatient Hospital Stay

## 2023-12-20 VITALS — BP 111/77 | HR 89 | Temp 98.4°F | Resp 18

## 2023-12-20 VITALS — HR 104

## 2023-12-20 DIAGNOSIS — C20 Malignant neoplasm of rectum: Secondary | ICD-10-CM

## 2023-12-20 DIAGNOSIS — E860 Dehydration: Secondary | ICD-10-CM

## 2023-12-20 DIAGNOSIS — K029 Dental caries, unspecified: Secondary | ICD-10-CM | POA: Diagnosis not present

## 2023-12-20 DIAGNOSIS — K219 Gastro-esophageal reflux disease without esophagitis: Secondary | ICD-10-CM

## 2023-12-20 DIAGNOSIS — T451X5A Adverse effect of antineoplastic and immunosuppressive drugs, initial encounter: Secondary | ICD-10-CM

## 2023-12-20 DIAGNOSIS — R112 Nausea with vomiting, unspecified: Secondary | ICD-10-CM | POA: Diagnosis not present

## 2023-12-20 DIAGNOSIS — C787 Secondary malignant neoplasm of liver and intrahepatic bile duct: Secondary | ICD-10-CM | POA: Diagnosis not present

## 2023-12-20 DIAGNOSIS — Z5112 Encounter for antineoplastic immunotherapy: Secondary | ICD-10-CM | POA: Diagnosis not present

## 2023-12-20 LAB — COMPREHENSIVE METABOLIC PANEL WITH GFR
ALT: 12 U/L (ref 0–44)
AST: 27 U/L (ref 15–41)
Albumin: 3.4 g/dL — ABNORMAL LOW (ref 3.5–5.0)
Alkaline Phosphatase: 166 U/L — ABNORMAL HIGH (ref 38–126)
Anion gap: 11 (ref 5–15)
BUN: 15 mg/dL (ref 6–20)
CO2: 23 mmol/L (ref 22–32)
Calcium: 8.5 mg/dL — ABNORMAL LOW (ref 8.9–10.3)
Chloride: 103 mmol/L (ref 98–111)
Creatinine, Ser: 0.82 mg/dL (ref 0.44–1.00)
GFR, Estimated: >60 mL/min (ref >60)
Glucose, Bld: 97 mg/dL (ref 70–99)
Potassium: 3.4 mmol/L — ABNORMAL LOW (ref 3.5–5.1)
Sodium: 137 mmol/L (ref 135–145)
Total Bilirubin: 3 mg/dL — ABNORMAL HIGH (ref 0.0–1.2)
Total Protein: 7 g/dL (ref 6.5–8.1)

## 2023-12-20 LAB — CBC WITH DIFFERENTIAL/PLATELET
Abs Immature Granulocytes: 0.01 K/uL (ref 0.00–0.07)
Basophils Absolute: 0 K/uL (ref 0.0–0.1)
Basophils Relative: 1 %
Eosinophils Absolute: 0 K/uL (ref 0.0–0.5)
Eosinophils Relative: 1 %
HCT: 32.2 % — ABNORMAL LOW (ref 36.0–46.0)
Hemoglobin: 10.7 g/dL — ABNORMAL LOW (ref 12.0–15.0)
Immature Granulocytes: 0 %
Lymphocytes Relative: 17 %
Lymphs Abs: 0.5 K/uL — ABNORMAL LOW (ref 0.7–4.0)
MCH: 34.5 pg — ABNORMAL HIGH (ref 26.0–34.0)
MCHC: 33.2 g/dL (ref 30.0–36.0)
MCV: 103.9 fL — ABNORMAL HIGH (ref 80.0–100.0)
Monocytes Absolute: 0.3 K/uL (ref 0.1–1.0)
Monocytes Relative: 8 %
Neutro Abs: 2.2 K/uL (ref 1.7–7.7)
Neutrophils Relative %: 73 %
Platelets: 100 K/uL — ABNORMAL LOW (ref 150–400)
RBC: 3.1 MIL/uL — ABNORMAL LOW (ref 3.87–5.11)
RDW: 16.6 % — ABNORMAL HIGH (ref 11.5–15.5)
WBC: 3 K/uL — ABNORMAL LOW (ref 4.0–10.5)
nRBC: 0 % (ref 0.0–0.2)

## 2023-12-20 LAB — MAGNESIUM: Magnesium: 2.2 mg/dL (ref 1.7–2.4)

## 2023-12-20 MED ORDER — SODIUM CHLORIDE 0.9 % IV SOLN
1920.0000 mg/m2 | INTRAVENOUS | Status: DC
  Administered 2023-12-20: 3500 mg via INTRAVENOUS
  Filled 2023-12-20: qty 70

## 2023-12-20 MED ORDER — PANTOPRAZOLE SODIUM 20 MG PO TBEC
20.0000 mg | DELAYED_RELEASE_TABLET | Freq: Every day | ORAL | 0 refills | Status: DC
  Filled 2023-12-20: qty 90, 90d supply, fill #0

## 2023-12-20 MED ORDER — FAMOTIDINE IN NACL 20-0.9 MG/50ML-% IV SOLN
20.0000 mg | Freq: Once | INTRAVENOUS | Status: AC
  Administered 2023-12-20: 20 mg via INTRAVENOUS
  Filled 2023-12-20: qty 50

## 2023-12-20 MED ORDER — SODIUM CHLORIDE 0.9 % IV SOLN
320.0000 mg/m2 | Freq: Once | INTRAVENOUS | Status: AC
  Administered 2023-12-20: 540 mg via INTRAVENOUS
  Filled 2023-12-20: qty 27

## 2023-12-20 MED ORDER — SODIUM CHLORIDE 0.9 % IV SOLN: Freq: Once | INTRAVENOUS | Status: AC

## 2023-12-20 MED ORDER — DEXAMETHASONE SODIUM PHOSPHATE 10 MG/ML IJ SOLN
10.0000 mg | Freq: Once | INTRAMUSCULAR | Status: AC
  Administered 2023-12-20: 10 mg via INTRAVENOUS
  Filled 2023-12-20: qty 1

## 2023-12-20 MED ORDER — FLUOROURACIL CHEMO INJECTION 500 MG/10ML
320.0000 mg/m2 | Freq: Once | INTRAVENOUS | Status: AC
  Administered 2023-12-20: 500 mg via INTRAVENOUS
  Filled 2023-12-20: qty 10

## 2023-12-20 MED ORDER — SODIUM CHLORIDE 0.9 % IV SOLN
5.0000 mg/kg | Freq: Once | INTRAVENOUS | Status: AC
  Administered 2023-12-20: 300 mg via INTRAVENOUS
  Filled 2023-12-20: qty 12

## 2023-12-20 MED ORDER — PALONOSETRON HCL INJECTION 0.25 MG/5ML
0.2500 mg | Freq: Once | INTRAVENOUS | Status: AC
  Administered 2023-12-20: 0.25 mg via INTRAVENOUS
  Filled 2023-12-20: qty 5

## 2023-12-20 NOTE — Assessment & Plan Note (Addendum)
 Requires crown and three fillings. No significant bleeding risk expected with the procedure, but caution advised due to Avastin .  - Proceed with dental procedures with caution regarding bleeding risk.

## 2023-12-20 NOTE — Patient Instructions (Signed)
 [facility] [place] - A DEPT OF [facility]  Discharge Instructions: Thank you for choosing [facility] to provide your oncology and hematology care.  If you have a lab appointment with the Cancer Center - please note that after April 8th, 2024, all labs will be drawn in the cancer center.  You do not have to check in or register with the main entrance as you have in the past but will complete your check-in in the cancer center.  Wear comfortable clothing and clothing appropriate for easy access to any Portacath or PICC line.   We strive to give you quality time with your provider. You may need to reschedule your appointment if you arrive late (15 or more minutes).  Arriving late affects you and other patients whose appointments are after yours.  Also, if you miss three or more appointments without notifying the office, you may be dismissed from the clinic at the provider's discretion.      For prescription refill requests, have your pharmacy contact our office and allow 72 hours for refills to be completed.    Today you received the following chemotherapy and/or immunotherapy agents adruicil.       To help prevent nausea and vomiting after your treatment, we encourage you to take your nausea medication as directed.  BELOW ARE SYMPTOMS THAT SHOULD BE REPORTED IMMEDIATELY: *FEVER GREATER THAN 100.4 F (38 C) OR HIGHER *CHILLS OR SWEATING *NAUSEA AND VOMITING THAT IS NOT CONTROLLED WITH YOUR NAUSEA MEDICATION *UNUSUAL SHORTNESS OF BREATH *UNUSUAL BRUISING OR BLEEDING *URINARY PROBLEMS (pain or burning when urinating, or frequent urination) *BOWEL PROBLEMS (unusual diarrhea, constipation, pain near the anus) TENDERNESS IN MOUTH AND THROAT WITH OR WITHOUT PRESENCE OF ULCERS (sore throat, sores in mouth, or a toothache) UNUSUAL RASH, SWELLING OR PAIN  UNUSUAL VAGINAL DISCHARGE OR ITCHING   Items with * indicate a potential emergency and should be followed  up as soon as possible or go to the Emergency Department if any problems should occur.  Please show the CHEMOTHERAPY ALERT CARD or IMMUNOTHERAPY ALERT CARD at check-in to the Emergency Department and triage nurse.  Should you have questions after your visit or need to cancel or reschedule your appointment, please contact [facility] [place] - A DEPT OF [facility] [phone]  and follow the prompts.  Office hours are 8:00 a.m. to 4:30 p.m. Monday - Friday. Please note that voicemails left after 4:00 p.m. may not be returned until the following business day.  We are closed weekends and major holidays. You have access to a nurse at all times for urgent questions. Please call the main number to the clinic [phone] and follow the prompts.  For any non-urgent questions, you may also contact your provider using MyChart. We now offer e-Visits for anyone 29 and older to request care online for non-urgent symptoms. For details visit [email or web address].   Also download the MyChart app! Go to the app store, search MyChart, open the app, select , and log in with your MyChart username and password.

## 2023-12-20 NOTE — Progress Notes (Signed)
 Lab work and vital signs reviewed by DOROTHA Hope, NP with verbal order ok to treat.   Patient tolerated chemotherapy with no complaints voiced.  Side effects with management reviewed with understanding verbalized.  Port site clean and dry with no bruising or swelling noted at site.  Good blood return noted before and after administration of chemotherapy.  Chemo pump connected with no alarms noted.  Patient left in satisfactory condition with VSS and no s/s of distress noted.

## 2023-12-20 NOTE — Assessment & Plan Note (Addendum)
 Has improved.  No additional fluids needed.  She is able to keep liquids down.

## 2023-12-20 NOTE — Progress Notes (Signed)
   12/20/23 1200  Spiritual Encounters  Type of Visit Initial  Care provided to: Pt and family (Husband Oneil)  Referral source Chaplain assessment  Reason for visit  (Introduction to Spiritual Care)  OnCall Visit No  Spiritual Framework  Presenting Themes Meaning/purpose/sources of inspiration;Caregiving needs;Values and beliefs;Impactful experiences and emotions  Community/Connection Family;Faith community;Spiritual leader  Patient Stress Factors None identified  Family Stress Factors None identified  Interventions  Spiritual Care Interventions Made Established relationship of care and support;Compassionate presence;Reflective listening;Narrative/life review;Meaning making;Encouragement  Intervention Outcomes  Outcomes Connection to spiritual care;Awareness of support  Spiritual Care Plan  Spiritual Care Issues Still Outstanding Chaplain will continue to follow   Reason for Visit: Chaplain identified Pt on the schedule as a Pt I had not connected with yet and visited to deliver introduction to Spiritual Care  Description of Visit: Upon arrival I found Samantha [facility] sitting in the recliner receiving her treatment with her husband, Oneil, present as her support person.  I introduced myself as the chaplain for the cancer center and offered a brief education on the role of a chaplain and the support we can offer to our patients, caregivers, and staff.    Kalei shared with me that both she and her husband are employees of [employer]. 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  Janyiah appears to be a strong and mature [place] woman.  Her faith plays a large role in her coping and meaning making.  She seems to be well connected with a faith community and is receiving support from them.  She also has a son still living at home who is an EMT and helping with her support.  Betzayda presents as having all the support she needs, and the coping skills  necessary for her journey.  Even so, I will continue to follow up on a regular basis to monitor support needs as they arise.  Plan of Care: I will continue to follow up with Elveria on a monthly basis.     Maude Roll, MDiv  Chaplain, [facility] [email or web address] [phone]

## 2023-12-20 NOTE — Assessment & Plan Note (Addendum)
 Persistent nausea and vomiting post-chemotherapy causing dehydration and poor appetite. Reports improvement of her nausea since getting treatment last week.  She has not needed her antiemetics.

## 2023-12-20 NOTE — Assessment & Plan Note (Addendum)
 Chronic cough and hoarseness potentially related to GERD.  Pepcid  discontinued due to side effects.  She was started on 40 mg pantoprazole  with improvement of her hoarseness.  Unfortunately, developed loose stools and the need to use the restroom every 30 to 45 minutes. ENT referral recommended for hoarseness evaluation.  No appointment has been scheduled yet. We discussed reducing from 40 to 20 mg to see if she was able to tolerate better.  New prescription sent.

## 2023-12-20 NOTE — Progress Notes (Signed)
 [facility] OFFICE PROGRESS NOTE  [facility] LABOR, MD  ASSESSMENT & PLAN:    Assessment & Plan Malignant neoplasm of rectum [facility]) Metastatic rectal adenocarcinoma-metastatic to lungs, liver and C6 vertebrae.  Initially diagnosed in 2019.  Extensive oncology history below.  NGS: KRAS/NRAS: Wild-type, BRAF: Negative Patient had chemotherapy with FOLFOX and panitumumab .  S/p APR with aggravating ileostomy. Multiple SBRT to liver, lung and C6 vertebral lesion. Also had brain metastasis for which she underwent SRS Recent CEA uptrending and pending from today.  Increasing lung nodule on PET scan. CT scan from 12/12/2023 showed mixed response with increasing left upper lobe pulmonary nodule. Chemo was held last week secondary to nausea and vomiting. Discussed recent lab work which is more or less stable from previous and okay for treatment.  Proceed with next cycle of treatment. PET scan and see Dr. Davonna back for reassessment prior to next cycle of chemotherapy.  Gastroesophageal reflux disease, unspecified whether esophagitis present Chronic cough and hoarseness potentially related to GERD.  Pepcid  discontinued due to side effects.  She was started on 40 mg pantoprazole  with improvement of her hoarseness.  Unfortunately, developed loose stools and the need to use the restroom every 30 to 45 minutes. ENT referral recommended for hoarseness evaluation.  No appointment has been scheduled yet. We discussed reducing from 40 to 20 mg to see if she was able to tolerate better.  New prescription sent. Chemotherapy induced nausea and vomiting Persistent nausea and vomiting post-chemotherapy causing dehydration and poor appetite. Reports improvement of her nausea since getting treatment last week.  She has not needed her antiemetics.  Dehydration Has improved.  No additional fluids needed.  She is able to keep liquids down. Dental caries Requires crown and three fillings. No  significant bleeding risk expected with the procedure, but caution advised due to Avastin .  - Proceed with dental procedures with caution regarding bleeding risk.  Orders Placed This Encounter  Procedures   NM PET Image Restag (PS) Skull Base To Thigh    Standing Status:   Future    Expected Date:   01/19/2024    Expiration Date:   12/19/2024    If indicated for the ordered procedure, I authorize the administration of a radiopharmaceutical per Radiology protocol:   Yes    Is the patient pregnant?:   No    Preferred imaging location?:   [facility]    INTERVAL HISTORY: Patient returns for follow-up.  Chemotherapy was held last week secondary to nausea and dehydration.  She was given IV fluids with improvement of her symptoms.  She was started on pantoprazole  40 mg daily for acid reflux and voice hoarseness with improvement but unfortunately developed loose stools going every 30 to 45 minutes while on the medication.  Reports as soon as she stopped it, the GI symptoms resolved.  She is here for reevaluation and possible chemotherapy.  Reports appetite is low and weight is down 2 pounds from previous.  We reviewed CMP, CBC, magnesium  and CEA.  SUMMARY OF HEMATOLOGIC HISTORY: Oncology History  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HCC)  02/26/2018 Initial Diagnosis   Rectal cancer (HCC)   03/13/2018 Cancer Staging   Staging form: Colon and Rectum, AJCC 8th Edition - Clinical stage from 03/13/2018: Stage IVA (cT3, cN1b, cM1a) - Signed by Rogers Hai, MD on 03/13/2018   03/14/2018 - 06/29/2018 Chemotherapy   The patient had palonosetron  (ALOXI ) injection 0.25 mg, 0.25 mg, Intravenous,  Once, 7 of 8 cycles Administration: 0.25  mg (03/14/2018), 0.25 mg (03/27/2018), 0.25 mg (04/18/2018), 0.25 mg (05/02/2018), 0.25 mg (05/16/2018), 0.25 mg (06/05/2018), 0.25 mg (06/27/2018) leucovorin  800 mg in dextrose  5 % 250 mL infusion, 772 mg, Intravenous,  Once, 7 of 8 cycles Administration: 800 mg  (03/14/2018), 800 mg (03/27/2018), 800 mg (05/02/2018), 800 mg (05/16/2018), 700 mg (04/18/2018), 800 mg (06/05/2018), 800 mg (06/27/2018) oxaliplatin  (ELOXATIN ) 165 mg in dextrose  5 % 500 mL chemo infusion, 85 mg/m2 = 165 mg, Intravenous,  Once, 6 of 7 cycles Dose modification: 68 mg/m2 (80 % of original dose 85 mg/m2, Cycle 4, Reason: Provider Judgment) Administration: 165 mg (03/14/2018), 165 mg (03/27/2018), 130 mg (05/02/2018), 130 mg (05/16/2018), 130 mg (06/05/2018), 130 mg (06/27/2018) panitumumab  (VECTIBIX ) 500 mg in sodium chloride  0.9 % 100 mL chemo infusion, 480 mg, Intravenous,  Once, 4 of 5 cycles Administration: 500 mg (04/18/2018), 480 mg (05/02/2018), 480 mg (05/16/2018), 480 mg (06/05/2018) fluorouracil  (ADRUCIL ) chemo injection 750 mg, 400 mg/m2 = 750 mg (100 % of original dose 400 mg/m2), Intravenous,  Once, 6 of 7 cycles Dose modification: 400 mg/m2 (original dose 400 mg/m2, Cycle 1), 400 mg/m2 (original dose 400 mg/m2, Cycle 3) Administration: 750 mg (03/14/2018), 750 mg (04/18/2018), 750 mg (05/02/2018), 750 mg (05/16/2018), 750 mg (06/05/2018), 750 mg (06/27/2018) fosaprepitant  (EMEND) 150 mg, dexamethasone  (DECADRON ) 12 mg in sodium chloride  0.9 % 145 mL IVPB, , Intravenous,  Once, 4 of 5 cycles Administration:  (05/02/2018),  (05/16/2018),  (06/05/2018),  (06/27/2018) fluorouracil  (ADRUCIL ) 4,650 mg in sodium chloride  0.9 % 57 mL chemo infusion, 2,400 mg/m2 = 4,650 mg, Intravenous, 1 Day/Dose, 7 of 8 cycles Administration: 4,650 mg (03/14/2018), 4,650 mg (03/27/2018), 4,650 mg (04/18/2018), 4,650 mg (05/02/2018), 4,650 mg (05/16/2018), 4,650 mg (06/05/2018), 4,650 mg (06/27/2018)  for chemotherapy treatment.    07/06/2022 -  Chemotherapy   Patient is on Treatment Plan : COLORECTAL FOLFOX + Bevacizumab  q14d        CBC    Component Value Date/Time   WBC 3.0 (L) 12/20/2023 0837   RBC 3.10 (L) 12/20/2023 0837   HGB 10.7 (L) 12/20/2023 0837   HGB 13.5 11/03/2015 0841   HCT 32.2 (L) 12/20/2023 0837   HCT  39.7 11/03/2015 0841   PLT 100 (L) 12/20/2023 0837   PLT 165 11/03/2015 0841   MCV 103.9 (H) 12/20/2023 0837   MCV 95 11/03/2015 0841   MCH 34.5 (H) 12/20/2023 0837   MCHC 33.2 12/20/2023 0837   RDW 16.6 (H) 12/20/2023 0837   RDW 13.2 11/03/2015 0841   LYMPHSABS 0.5 (L) 12/20/2023 0837   LYMPHSABS 1.4 11/03/2015 0841   MONOABS 0.3 12/20/2023 0837   EOSABS 0.0 12/20/2023 0837   EOSABS 0.1 11/03/2015 0841   BASOSABS 0.0 12/20/2023 0837   BASOSABS 0.0 11/03/2015 0841       Latest Ref Rng & Units 12/20/2023    8:37 AM 12/13/2023    8:55 AM 11/15/2023    8:38 AM  CMP  Glucose 70 - 99 mg/dL 97  99  89   BUN 6 - 20 mg/dL 15  14  13    Creatinine 0.44 - 1.00 mg/dL 9.17  9.24  9.21   Sodium 135 - 145 mmol/L 137  137  139   Potassium 3.5 - 5.1 mmol/L 3.4  3.6  3.5   Chloride 98 - 111 mmol/L 103  102  106   CO2 22 - 32 mmol/L 23  21  22    Calcium  8.9 - 10.3 mg/dL 8.5  8.7  8.8  Total Protein 6.5 - 8.1 g/dL 7.0  7.1  7.0   Total Bilirubin 0.0 - 1.2 mg/dL 3.0  2.3  3.8   Alkaline Phos 38 - 126 U/L 166  178  164   AST 15 - 41 U/L 27  23  31    ALT 0 - 44 U/L 12  12  19       Lab Results  Component Value Date   FERRITIN 89 09/07/2022   VITAMINB12 360 03/31/2022    Vitals:   12/20/23 0919  Pulse: (!) 104    Review of System:  Review of Systems  Constitutional:  Positive for malaise/fatigue.  Respiratory:  Positive for cough.   Gastrointestinal:  Positive for constipation, diarrhea, heartburn and nausea. Negative for blood in stool and vomiting.  Psychiatric/Behavioral:  The patient is nervous/anxious.     Physical Exam: Physical Exam Constitutional:      Appearance: Normal appearance.  HENT:     Head: Normocephalic and atraumatic.  Eyes:     Pupils: Pupils are equal, round, and reactive to light.  Cardiovascular:     Rate and Rhythm: Normal rate and regular rhythm.     Heart sounds: Normal heart sounds. No murmur heard. Pulmonary:     Effort: Pulmonary effort is  normal.     Breath sounds: Normal breath sounds. No wheezing.  Abdominal:     General: Bowel sounds are normal. There is no distension.     Palpations: Abdomen is soft.     Tenderness: There is no abdominal tenderness.  Musculoskeletal:        General: Normal range of motion.     Cervical back: Normal range of motion.  Skin:    General: Skin is warm and dry.     Findings: No rash.  Neurological:     Mental Status: She is alert and oriented to person, place, and time.     Gait: Gait is intact.  Psychiatric:        Mood and Affect: Mood and affect normal.        Cognition and Memory: Memory normal.        Judgment: Judgment normal.      I spent 25 minutes dedicated to the care of this patient (face-to-face and non-face-to-face) on the date of the encounter to include what is described in the assessment and plan.,  Delon Hope, NP 12/20/2023 12:27 PM

## 2023-12-20 NOTE — Assessment & Plan Note (Addendum)
 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  Initially diagnosed in 2019.  Extensive oncology history below.  NGS: KRAS/NRAS: Wild-type, BRAF: Negative Patient had chemotherapy with FOLFOX and panitumumab .  S/p APR with aggravating ileostomy. Multiple SBRT to liver, lung and C6 vertebral lesion. Also had brain metastasis for which she underwent SRS Recent CEA uptrending and pending from today.  Increasing lung nodule on PET scan. CT scan from 12/12/2023 showed mixed response with increasing left upper lobe pulmonary nodule. Chemo was held last week secondary to nausea and vomiting. Discussed recent lab work which is more or less stable from previous and okay for treatment.  Proceed with next cycle of treatment. PET scan and see Dr. Davonna back for reassessment prior to next cycle of chemotherapy.

## 2023-12-21 LAB — CEA: CEA: 6.3 ng/mL — ABNORMAL HIGH (ref 0.0–4.7)

## 2023-12-21 NOTE — Addendum Note (Signed)
 Addended by: Prestyn Stanco on: 12/21/2023 03:10 PM   Modules accepted: Level of Service

## 2023-12-22 ENCOUNTER — Inpatient Hospital Stay

## 2023-12-22 NOTE — Progress Notes (Signed)
 Patient presents today for pump d/c. Vital signs are stable. Port a cath site clean, dry, and intact. Port flushed with 10 mls of Normal Saline. Needle removed intact. Band aid applied. Patient has no complaints at this time. Discharged from clinic ambulatory and in stable condition. Patient alert and oriented. All follow ups as scheduled.   [facility]

## 2024-01-11 ENCOUNTER — Encounter (HOSPITAL_COMMUNITY)
Admission: RE | Admit: 2024-01-11 | Discharge: 2024-01-11 | Disposition: A | Discharge status: Home / Self Care | Source: Ambulatory Visit | Attending: Oncology | Admitting: Oncology

## 2024-01-11 DIAGNOSIS — R918 Other nonspecific abnormal finding of lung field: Secondary | ICD-10-CM | POA: Diagnosis not present

## 2024-01-11 DIAGNOSIS — C20 Malignant neoplasm of rectum: Secondary | ICD-10-CM | POA: Diagnosis not present

## 2024-01-11 MED ORDER — FLUDEOXYGLUCOSE F - 18 (FDG) INJECTION
6.0100 | Freq: Once | INTRAVENOUS | Status: AC | PRN
  Administered 2024-01-11: 6.01 via INTRAVENOUS

## 2024-01-17 ENCOUNTER — Inpatient Hospital Stay: Attending: Hematology

## 2024-01-17 ENCOUNTER — Inpatient Hospital Stay (HOSPITAL_BASED_OUTPATIENT_CLINIC_OR_DEPARTMENT_OTHER): Admitting: Oncology

## 2024-01-17 ENCOUNTER — Encounter: Payer: Self-pay | Admitting: Oncology

## 2024-01-17 ENCOUNTER — Inpatient Hospital Stay

## 2024-01-17 VITALS — BP 108/77 | HR 99 | Temp 96.3°F | Resp 18

## 2024-01-17 DIAGNOSIS — Z885 Allergy status to narcotic agent status: Secondary | ICD-10-CM | POA: Diagnosis not present

## 2024-01-17 DIAGNOSIS — Z5111 Encounter for antineoplastic chemotherapy: Secondary | ICD-10-CM | POA: Insufficient documentation

## 2024-01-17 DIAGNOSIS — C7931 Secondary malignant neoplasm of brain: Secondary | ICD-10-CM | POA: Diagnosis not present

## 2024-01-17 DIAGNOSIS — C20 Malignant neoplasm of rectum: Secondary | ICD-10-CM

## 2024-01-17 DIAGNOSIS — K521 Toxic gastroenteritis and colitis: Secondary | ICD-10-CM | POA: Diagnosis not present

## 2024-01-17 DIAGNOSIS — E278 Other specified disorders of adrenal gland: Secondary | ICD-10-CM | POA: Diagnosis not present

## 2024-01-17 DIAGNOSIS — J984 Other disorders of lung: Secondary | ICD-10-CM | POA: Diagnosis not present

## 2024-01-17 DIAGNOSIS — Y842 Radiological procedure and radiotherapy as the cause of abnormal reaction of the patient, or of later complication, without mention of misadventure at the time of the procedure: Secondary | ICD-10-CM | POA: Diagnosis not present

## 2024-01-17 DIAGNOSIS — Z881 Allergy status to other antibiotic agents status: Secondary | ICD-10-CM | POA: Insufficient documentation

## 2024-01-17 DIAGNOSIS — Z932 Ileostomy status: Secondary | ICD-10-CM | POA: Diagnosis not present

## 2024-01-17 DIAGNOSIS — C779 Secondary and unspecified malignant neoplasm of lymph node, unspecified: Secondary | ICD-10-CM | POA: Diagnosis not present

## 2024-01-17 DIAGNOSIS — T451X5A Adverse effect of antineoplastic and immunosuppressive drugs, initial encounter: Secondary | ICD-10-CM | POA: Diagnosis not present

## 2024-01-17 DIAGNOSIS — C787 Secondary malignant neoplasm of liver and intrahepatic bile duct: Secondary | ICD-10-CM | POA: Insufficient documentation

## 2024-01-17 DIAGNOSIS — D649 Anemia, unspecified: Secondary | ICD-10-CM

## 2024-01-17 DIAGNOSIS — Z5112 Encounter for antineoplastic immunotherapy: Secondary | ICD-10-CM | POA: Insufficient documentation

## 2024-01-17 DIAGNOSIS — C7971 Secondary malignant neoplasm of right adrenal gland: Secondary | ICD-10-CM | POA: Diagnosis not present

## 2024-01-17 DIAGNOSIS — K219 Gastro-esophageal reflux disease without esophagitis: Secondary | ICD-10-CM | POA: Diagnosis not present

## 2024-01-17 DIAGNOSIS — G939 Disorder of brain, unspecified: Secondary | ICD-10-CM | POA: Diagnosis not present

## 2024-01-17 DIAGNOSIS — Z79631 Long term (current) use of antimetabolite agent: Secondary | ICD-10-CM | POA: Diagnosis not present

## 2024-01-17 DIAGNOSIS — K029 Dental caries, unspecified: Secondary | ICD-10-CM | POA: Insufficient documentation

## 2024-01-17 DIAGNOSIS — Z880 Allergy status to penicillin: Secondary | ICD-10-CM | POA: Insufficient documentation

## 2024-01-17 DIAGNOSIS — C7801 Secondary malignant neoplasm of right lung: Secondary | ICD-10-CM | POA: Insufficient documentation

## 2024-01-17 DIAGNOSIS — Z79899 Other long term (current) drug therapy: Secondary | ICD-10-CM | POA: Insufficient documentation

## 2024-01-17 DIAGNOSIS — R110 Nausea: Secondary | ICD-10-CM | POA: Diagnosis not present

## 2024-01-17 DIAGNOSIS — C7951 Secondary malignant neoplasm of bone: Secondary | ICD-10-CM | POA: Insufficient documentation

## 2024-01-17 LAB — COMPREHENSIVE METABOLIC PANEL WITH GFR
ALT: 12 U/L (ref 0–44)
AST: 28 U/L (ref 15–41)
Albumin: 3.9 g/dL (ref 3.5–5.0)
Alkaline Phosphatase: 191 U/L — ABNORMAL HIGH (ref 38–126)
Anion gap: 11 (ref 5–15)
BUN: 10 mg/dL (ref 6–20)
CO2: 23 mmol/L (ref 22–32)
Calcium: 8.6 mg/dL — ABNORMAL LOW (ref 8.9–10.3)
Chloride: 103 mmol/L (ref 98–111)
Creatinine, Ser: 0.78 mg/dL (ref 0.44–1.00)
GFR, Estimated: >60 mL/min (ref >60)
Glucose, Bld: 92 mg/dL (ref 70–99)
Potassium: 3.5 mmol/L (ref 3.5–5.1)
Sodium: 137 mmol/L (ref 135–145)
Total Bilirubin: 1.8 mg/dL — ABNORMAL HIGH (ref 0.0–1.2)
Total Protein: 6.8 g/dL (ref 6.5–8.1)

## 2024-01-17 LAB — CBC WITH DIFFERENTIAL/PLATELET
Abs Immature Granulocytes: 0.01 K/uL (ref 0.00–0.07)
Basophils Absolute: 0 K/uL (ref 0.0–0.1)
Basophils Relative: 1 %
Eosinophils Absolute: 0 K/uL (ref 0.0–0.5)
Eosinophils Relative: 1 %
HCT: 32.1 % — ABNORMAL LOW (ref 36.0–46.0)
Hemoglobin: 10.7 g/dL — ABNORMAL LOW (ref 12.0–15.0)
Immature Granulocytes: 1 %
Lymphocytes Relative: 22 %
Lymphs Abs: 0.5 K/uL — ABNORMAL LOW (ref 0.7–4.0)
MCH: 35.1 pg — ABNORMAL HIGH (ref 26.0–34.0)
MCHC: 33.3 g/dL (ref 30.0–36.0)
MCV: 105.2 fL — ABNORMAL HIGH (ref 80.0–100.0)
Monocytes Absolute: 0.2 K/uL (ref 0.1–1.0)
Monocytes Relative: 8 %
Neutro Abs: 1.5 K/uL — ABNORMAL LOW (ref 1.7–7.7)
Neutrophils Relative %: 67 %
Platelets: 66 K/uL — ABNORMAL LOW (ref 150–400)
RBC: 3.05 MIL/uL — ABNORMAL LOW (ref 3.87–5.11)
RDW: 15.3 % (ref 11.5–15.5)
WBC: 2.2 K/uL — ABNORMAL LOW (ref 4.0–10.5)
nRBC: 0 % (ref 0.0–0.2)

## 2024-01-17 LAB — URINALYSIS, DIPSTICK ONLY
Bilirubin Urine: NEGATIVE
Glucose, UA: NEGATIVE mg/dL
Hgb urine dipstick: NEGATIVE
Ketones, ur: NEGATIVE mg/dL
Nitrite: NEGATIVE
Protein, ur: NEGATIVE mg/dL
Specific Gravity, Urine: 1.012 (ref 1.005–1.030)
pH: 6 (ref 5.0–8.0)

## 2024-01-17 LAB — MAGNESIUM: Magnesium: 2.2 mg/dL (ref 1.7–2.4)

## 2024-01-17 MED ORDER — FAMOTIDINE IN NACL 20-0.9 MG/50ML-% IV SOLN
20.0000 mg | Freq: Once | INTRAVENOUS | Status: AC
  Administered 2024-01-17: 20 mg via INTRAVENOUS
  Filled 2024-01-17: qty 50

## 2024-01-17 MED ORDER — SODIUM CHLORIDE 0.9 % IV SOLN
320.0000 mg/m2 | Freq: Once | INTRAVENOUS | Status: AC
  Administered 2024-01-17: 540 mg via INTRAVENOUS
  Filled 2024-01-17: qty 27

## 2024-01-17 MED ORDER — FLUOROURACIL CHEMO INJECTION 500 MG/10ML
320.0000 mg/m2 | Freq: Once | INTRAVENOUS | Status: AC
  Administered 2024-01-17: 500 mg via INTRAVENOUS
  Filled 2024-01-17: qty 10

## 2024-01-17 MED ORDER — PALONOSETRON HCL INJECTION 0.25 MG/5ML
0.2500 mg | Freq: Once | INTRAVENOUS | Status: AC
  Administered 2024-01-17: 0.25 mg via INTRAVENOUS
  Filled 2024-01-17: qty 5

## 2024-01-17 MED ORDER — SODIUM CHLORIDE 0.9 % IV SOLN
5.0000 mg/kg | Freq: Once | INTRAVENOUS | Status: AC
  Administered 2024-01-17: 300 mg via INTRAVENOUS
  Filled 2024-01-17: qty 12

## 2024-01-17 MED ORDER — DEXAMETHASONE SOD PHOSPHATE PF 10 MG/ML IJ SOLN
10.0000 mg | Freq: Once | INTRAMUSCULAR | Status: AC
  Administered 2024-01-17: 10 mg via INTRAVENOUS

## 2024-01-17 MED ORDER — SODIUM CHLORIDE 0.9 % IV SOLN
1920.0000 mg/m2 | INTRAVENOUS | Status: DC
  Administered 2024-01-17: 3500 mg via INTRAVENOUS
  Filled 2024-01-17: qty 70

## 2024-01-17 MED ORDER — SODIUM CHLORIDE 0.9 % IV SOLN: Freq: Once | INTRAVENOUS | Status: AC

## 2024-01-17 NOTE — Patient Instructions (Signed)
 [facility] - A DEPT OF [facility]  Discharge Instructions: Thank you for choosing [facility] to provide your oncology and hematology care.  If you have a lab appointment with the Cancer Center - please note that after April 8th, 2024, all labs will be drawn in the cancer center.  You do not have to check in or register with the main entrance as you have in the past but will complete your check-in in the cancer center.  Wear comfortable clothing and clothing appropriate for easy access to any Portacath or PICC line.   We strive to give you quality time with your provider. You may need to reschedule your appointment if you arrive late (15 or more minutes).  Arriving late affects you and other patients whose appointments are after yours.  Also, if you miss three or more appointments without notifying the office, you may be dismissed from the clinic at the provider's discretion.      For prescription refill requests, have your pharmacy contact our office and allow 72 hours for refills to be completed.    Today you received the following chemotherapy and/or immunotherapy agents MVASI/Leucovorin/5FU   To help prevent nausea and vomiting after your treatment, we encourage you to take your nausea medication as directed.  BELOW ARE SYMPTOMS THAT SHOULD BE REPORTED IMMEDIATELY: *FEVER GREATER THAN 100.4 F (38 C) OR HIGHER *CHILLS OR SWEATING *NAUSEA AND VOMITING THAT IS NOT CONTROLLED WITH YOUR NAUSEA MEDICATION *UNUSUAL SHORTNESS OF BREATH *UNUSUAL BRUISING OR BLEEDING *URINARY PROBLEMS (pain or burning when urinating, or frequent urination) *BOWEL PROBLEMS (unusual diarrhea, constipation, pain near the anus) TENDERNESS IN MOUTH AND THROAT WITH OR WITHOUT PRESENCE OF ULCERS (sore throat, sores in mouth, or a toothache) UNUSUAL RASH, SWELLING OR PAIN  UNUSUAL VAGINAL DISCHARGE OR ITCHING   Items with * indicate a potential emergency and should be  followed up as soon as possible or go to the Emergency Department if any problems should occur.  Please show the CHEMOTHERAPY ALERT CARD or IMMUNOTHERAPY ALERT CARD at check-in to the Emergency Department and triage nurse.  Should you have questions after your visit or need to cancel or reschedule your appointment, please contact [facility] [place] - A DEPT OF [facility] [phone]  and follow the prompts.  Office hours are 8:00 a.m. to 4:30 p.m. Monday - Friday. Please note that voicemails left after 4:00 p.m. may not be returned until the following business day.  We are closed weekends and major holidays. You have access to a nurse at all times for urgent questions. Please call the main number to the clinic [phone] and follow the prompts.  For any non-urgent questions, you may also contact your provider using MyChart. We now offer e-Visits for anyone 82 and older to request care online for non-urgent symptoms. For details visit [email or web address].   Also download the MyChart app! Go to the app store, search "MyChart", open the app, select [facility], and log in with your MyChart username and password.

## 2024-01-17 NOTE — Progress Notes (Signed)
 Patient Care Team: Samantha Glendia LABOR, MD as PCP - General (Family Medicine)  Clinic Day:  01/17/2024  Referring physician: Alphonsa Glendia LABOR, MD   CHIEF COMPLAINT:  CC: Metastatic rectal carcinoma  ASSESSMENT & PLAN:   Assessment & Plan: Samantha Reynolds  is a 58 y.o. 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  Initially diagnosed in 2019.  Extensive oncology history below NGS: KRAS/NRAS: Wild-type, BRAF: Negative Patient had chemotherapy with FOLFOX and panitumumab .  S/p APR with aggravating ileostomy. Multiple SBRT to liver, lung and C6 vertebral lesion Also had brain metastasis for which she underwent SRS Recent CEA uptrending.  Increasing lung nodule on PET scan.  - We reviewed the recent PET scan findings together.  Patient has stable hypermetabolic left upper and left lower lobe pulmonary nodules that are consistent with metastatic disease.  There is also increased uptake in the posterior left lower lobe nodule. -Discussed reaching out to radiation oncology for possible SBRT of these lesions.  Also discussed that we can manage this conservatively by waiting at this time and repeat a PET scan in 3 months and if they grow at that time, can consider radiation at that time.  - Uptrending CEA likely consistent with enlarging lung nodule. - Will continue PET scans every 3 months.  Next one due 03/2024 - Will continue MRI brain every 3 months.  Patient missed her MRI and is in the process of rescheduling it. - If SBRT is not an option for the patient for the lung nodule, will add oxaliplatin  to her maintenance 5-FU and bevacizumab  -Labs reviewed today: CMP: Normal creatinine, alkaline phosphatase elevated at 191, total bilirubin: 1.8-lower from prior.  CBC: WBC: 2.2, hemoglobin: 10.7, platelets: 66. Latest CEA: 6.3-slightly elevated from prior. - Physical exam stable today.   Patient does not report any complaints today.  Will proceed with chemotherapy today. -Continue 5-FU and Avastin  every 4 weeks. (Dose reduced per patient's wishes)   Return to clinic in 4 weeks for follow-up  Chemotherapy induced nausea  Patient reports significant improvement in nausea.  No episodes of vomiting.  Chemotherapy-induced diarrhea Most often occurrence during the first week after chemotherapy.  Patient is mostly homebound during this period.   - Continue Imodium  as prescribed.  Gastroesophageal reflux disease, unspecified whether esophagitis present Chronic cough and hoarseness potentially related to GERD.  Pepcid  discontinued due to side effects.  20 mg pantoprazole  has been effective so far. ENT referral recommended for hoarseness evaluation.  Awaiting an appointment.  - Continue pantoprazole  20 mg for GERD. - Refer to ENT for hoarseness evaluation.  Dental caries Requires crown and three fillings. No significant bleeding risk expected with the procedure, but caution advised due to Avastin .  - Proceed with dental procedures with caution regarding bleeding risk.  The patient understands the plans discussed today and is in agreement with them.  She knows to contact our office if she develops concerns prior to her next appointment.  40 minutes of total time was spent for this patient encounter, including preparation,review of records,  face-to-face counseling with the patient and coordination of care, physical exam, and documentation of the encounter.   I, Marijo Sharps, acting as a Neurosurgeon for [employer], MD.,have documented all relevant documentation on the behalf of Samantha Dry, MD,as directed by  Samantha Dry, MD while in the presence of Samantha Dry, MD.  I, Samantha Dry MD, have reviewed the above documentation  for accuracy and completeness, and I agree with the above.    Samantha Dry, MD  [facility] [place] - A  DEPT OF [facility] [address] [place] [address] Dept: [phone] Dept Fax: [phone]   No orders of the defined types were placed in this encounter.    ONCOLOGY HISTORY:   I have reviewed her chart and materials related to her cancer extensively and collaborated history with the patient. Summary of oncologic history is as follows:   Stage IV rectal adenocarcinoma with liver metastasis:  -Presentation with intermittent rectal bleeding and weight loss of 20lbs in 3 months -02/19/2018: Flexible sigmoidoscopy: Rectal mass 2 to 4 cm from anal verge. The mass was noncircumferential and located predominantly at the posterior bowel wall.   -02/19/2018:Rectum biopsy: Adenocarcinoma -02/21/2018: MR pelvis: Rectal adenocarcinoma: T3, N1 equivocal N2 nodes. -02/21/2018: CT CAP: Rectal wall thickening, likely representing the primary lesion.  Perirectal nodes of maximally 7 mm, suspicious for nodal metastasis.  Isolated 9 mm left lower lobe pulmonary nodule.  Right hepatic lobe hypoattenuation, including central more nodular component and peripheral more wedge-shaped component. -03/05/2018: PET scan: Hypermetabolic rectal mass consistent with known neoplasm.  Small but hypermetabolic left perirectal and sigmoid mesocolon lymph nodes.  Single hepatic metastatic lesion in the right hepatic lobe.  7 mm pulmonary nodule at the left lung base is suspicious for metastasis. -03/08/2018: MRI liver: 1.5 cm lesion in the posterior right hepatic lobe, highly concerning for metastatic disease. -03/14/2018-06/27/2018: FOLFOX + Panitumumab  (7 cycles). Panitumumab  (cycles 3-6) discontinued after 6 cycles for severe rash.  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  Persistent but significantly improved radiotracer uptake associated with rectal neoplasm. -06/25/2018: MR liver: Hepatic metastasis in the right hepatic lobe with decreased significantly in size and demonstrate no perceptible enhancement. -07/20/2018-09/06/2018: Chemo RT to rectum/pelvis with Capecitabine  -09/27/2018: CT CAP: Stable post treatment findings of rectal neoplasm, metastatic liver lesion and presumed metastatic left lung nodule- stable. No evidence of new metastatic disease in the chest, abdomen, or pelvis. -10/17/2018: Right liver lesion microwave ablation  -12/07/2018: Low anterior resection and diverting ileostomy , Colon resection:  - Invasive adenocarcinoma, well-differentiated, scattered foci over 2.0 cm span - Adenocarcinoma extends into but not through the muscularis propria - The surgical resection margins are negative for carcinoma - No evidence of carcinoma in 0/4 lymph nodes -ypT2, ypN0. -p-MMR -12/11/2018: CT CAP: Postoperative changes from low APR with colo anal anastomosis and right lower quadrant ileostomy.Previously treated liver metastases within posterior right lobe of liver appears increased in size.  -12/2018-03/2019: CT CAP: Stable -07/10/2019: CT CAP: A left lower lobe pulmonary nodule is felt to have enlarged.  New inferior right hepatic lobe hypoattenuating lesion, most consistent with metastatic disease. -07/16/2019: MRI abdomen: Two enhancing lesions in the right hepatic lobe seen on recent CT are consistent with metastatic disease.No other evidence of metastatic disease in the abdomen.  -08/15/2019: Microwave ablation of the liver lesion -10/21/2019: CT CAP: Pulmonary nodules in the chest, largest in the LEFT lower lobe has enlarged since the prior study. Interval microwave ablation of lesions in the RIGHT hepatic lobe with NEW LEFT hepatic lobe  lesion compatible with additional site of metastasis. -  11/05/2019: MR liver: Isolated left hepatic metastasis, as on prior CT. Treated metastasis within the right hepatic lobe, without residual or recurrent disease. -11/15/2019-11/22/2019: SBRT to lung lesion -12/04/2019-12/13/2019: SBRT to liver lesion -01/22/2020: CT CAP: Evaluation of a new lesion of the inferior left lobe of the liver, previously better characterized by MR, is limited by the placement of adjacent metallic fiducial marking clips, however this lesion does appear to have somewhat diminished and decreased in density compared to prior examinations, consistent with treatment response to radiation therapy.Redemonstrated nonenhancing low-attenuation ablation sites of the inferior right lobe of the liver, hepatic segment VI and posterior liver dome, hepatic segment VII. No evidence of local recurrence at these sites. Interval enlargement of a nodule of the medial right pulmonary apex. Interval decrease in size of an irregular nodule of the dependent left lung base. -04/01/2020:CT CAP: Stable exam -07/01/2020: CT CAP: Interval enlargement of a pulmonary nodule of the medial right lung apex measuring 0.9 x 0.7 cm, previously 0.7 x 0.5 cm, highly suspicious for pulmonary metastatic disease.Interval increase in somewhat nodular consolidation of the dependent medial left lung base, measuring approximately 2.6 x 1.1 cm.  -08/06/2020-08/13/2020: SBRT to RUL lung lesion -11/06/2020: CT CAP: Stable disease -05/12/2021: CT CAP: Progression in pulmonary metastatic disease, namely in the left lower lobe. Likely associated increase in size of a subcarinal lymph node. -08/09/2021: CT chest: Enlarging LEFT lower lobe pulmonary nodule and tubular and irregular areas of nodularity and discrete nodularity at the medial aspect of the RIGHT lower lobe. Findings suspicious for worsening of disease and endobronchial dissemination of tumor. Decreasing size of RIGHT  hepatic lobe metastasis.  -08/19/2021: PET scan: Multifocal left lung nodules, compatible with metastases. Radiation changes in the right lung apex. Associated subcarinal nodal metastasis. Treated metastasis in the posterior right hepatic lobe. -09/11/2021-10/05/2021: SBRT left lung lesion -01/06/2022: CT CAP:  Interval response to therapy with decreased size of the dominant left infrahilar pulmonary nodule and subcarinal lymph node. New part solid subpleural density in the left lower lobe is likely related to interval therapy. The treated right upper lobe lesion is unchanged. Slowly enlarging solid nodule posteriorly in the left upper lobe, suspicious for a metastasis. No other suspicious nodules. The treated hepatic metastases are stable to decreased in size. No new or enlarging lesions. No evidence of progressive metastatic disease in the abdomen or pelvis. -05/26/2022: PET: Hypermetabolic LEFT upper lobe nodule along the fissures concerning for pulmonary metastasis. Interval resolution of hypermetabolic metastasis in the LEFT lower lobe, hilum, and mediastinum.There is evidence of NEW METASTATIC DISEASE to the RIGHT adrenal gland and recurrent metastatic malignancy in the LEFT hepatic lobe of the liver. New activity in this C6 vertebral body is consistent with new skeletal metastasis. - 06/09/2022: Liver, biopsy: - Metastatic moderately differentiated colonic adenocarcinoma -06/27/2022: [facility] NGS: KRAS/NRAS: Not detected, BRAF: Not detected.  MSI-stable, P-MMR, TMB: Low, 3mut/Mb.  - NTRK 1/2/3, RET, BRAF, eGFR, ERBB2, NF1, PD-L1, PIK 3 CA, POLE, PTEN: Negative -07/01/2022: MRI cervical spine: Osseous metastatic disease in the C6 vertebral body extending to the right posterior elements with extraosseous tumor along the posterior endplate and extending into the right C5-C6 and C6-C7 neural foramina, likely affecting the exiting C6 and C7 nerve roots. -07/06/2022-10/26/2022: FOLFOX + Bevacizumab .   -07/14/2022-08/03/2022: XRT to C6 vertebral lesion -08/18/2022: MRI brain:  3 mm nodular enhancing lesion within the anterior right frontal lobe white matter consistent with a metastasis. 4 mm enhancing focus within the left frontal calvarium -08/31/2022: SRS to the brain  lesion -11/03/2022: PET scan: Overall improvement in most of the metastatic areas -11/09/2022-Current: Maintenance 5FU + Bevacizumab    -01/25/2023-current: Changed to every 4 weeks infusion for tolerability and convenience. -11/2022-04/17/2023: PET scan every 3 months with no evidence of new disease -11/30/2022-Current: MRI brain every 3 months: NED -09/07/2023: PET: Decreasing low-level uptake along the rectum with surgical changes. Increased size and uptake of a nodule in the superior segment left lower lobe medially worrisome for a developing lung metastasis. Other areas of lung nodularity with low-level uptake are stable including some ill-defined areas in the left lower lobe and right lung apex and more defined area in the left upper lobe.  -11/01/2023: MR Brain with and without Contrast: No evidence of metastatic disease to the brain. Stable small focal area of contrast enhancement within the left frontal bone  along the inner table of the skull.  -12/12/2023: CT Chest with Contrast:  13 mm nodule seen previously in the medial right apex has become incorporated into more confluent architectural distortion and consolidative opacity suggesting post radiation scarring. Patchy consolidative disease seen previously in the retro hilar left ower lobe has become more confluent in the interval with air bronchograms. This may be related to evolving post treatment scarring although superimposed infection is not excluded. 10 mm posterior left upper lobe pulmonary nodule was approximately 8 mm previously. Increasing right paratracheal lymph node. 9 mm ill-defined hypoattenuating lesion in the medial right liver was not well seen on previous  PET-CT but has decreased compared to previous contrast infused CT of 05/13/2022. Stable 2 cm right adrenal nodule. Splenomegaly with similar appearance of cystic lesion anteriorly. 4.6 cm diameter ascending thoracic aorta.  -01/11/2024: PET Scan: Hypermetabolic left upper and left lower lobe pulmonary nodules again noted consistent with metastatic disease. No substantial change in size although some lesion show mild interval increase in FDG uptake. Nodular consolidative opacity in the posterior left lower lobe shows modest hypermetabolism. This may be in part related to treatment related change. Low level FDG uptake associated with the right apical scarring. No evidence for hypermetabolic metastatic disease in the abdomen or pelvis. Stable mild hypermetabolism associated with the low rectum/anal region.    Current Treatment:  Maintenance 5FU + Bevacizumab  every 4 weeks  INTERVAL HISTORY:   Samantha Reynolds is here today for follow up for metastatic rectal carcinoma. Patient is accompanied by her husband today.   Kailan states that she still experiences coughing fits towards the evening but has significantly improved from prior.  She reports pantoprazole  has been working and is willing to try cetirizine.   Taaliyah also mentions that she is often house bound during her treatment because of major diarrheal episodes. She also experiences some nausea that causes poor appetite while on treatment.  She is currently taking medications for both.  Patient has been doing well today and reports no significant complaints today.  I have reviewed the past medical history, past surgical history, social history and family history with the patient and they are unchanged from previous note.  ALLERGIES:  is allergic to oxaliplatin , demerol  [meperidine ], penicillins, levaquin  [levofloxacin ], other, and vancomycin .  MEDICATIONS:  Current Outpatient Medications  Medication Sig Dispense Refill   ALPRAZolam  (XANAX )  0.5 MG tablet Take 1 tablet (0.5 mg total) by mouth 3 (three) times daily as needed for anxiety. 90 tablet 5   amitriptyline  (ELAVIL ) 10 MG tablet Take 1 tablet (10 mg total) by mouth 2 (two) times daily. 180 tablet 3   ARMOUR THYROID  60 MG  tablet Take 1 tablet (60 mg total) by mouth daily. 90 tablet 2   benzonatate  (TESSALON ) 100 MG capsule Take 1 capsule (100 mg total) by mouth 2 (two) times daily as needed for cough. 20 capsule 2   loperamide  (IMODIUM ) 2 MG capsule Take 1 capsule (2 mg total) by mouth 3 (three) times daily. (Patient taking differently: Take 2 mg by mouth daily.) 30 capsule 0   pantoprazole  (PROTONIX ) 20 MG tablet Take 1 tablet (20 mg total) by mouth daily. 90 tablet 0   prochlorperazine  (COMPAZINE ) 10 MG tablet Take 1 tablet (10 mg total) by mouth every 6 (six) hours as needed for nausea or vomiting. 30 tablet 4   thyroid  (ARMOUR THYROID ) 30 MG tablet Take 1 tablet (30 mg total) by mouth daily before breakfast every Monday. 21 tablet 5   No current facility-administered medications for this visit.   Facility-Administered Medications Ordered in Other Visits  Medication Dose Route Frequency Provider Last Rate Last Admin   clindamycin  (CLEOCIN ) 900 mg in dextrose  5 % 50 mL IVPB  900 mg Intravenous 60 min Pre-Op Debby Hila, MD       And   gentamicin  (GARAMYCIN ) 350 mg in dextrose  5 % 50 mL IVPB  5 mg/kg Intravenous 60 min Pre-Op Debby Hila, MD       clindamycin  (CLEOCIN ) 900 mg in dextrose  5 % 50 mL IVPB  900 mg Intravenous 60 min Pre-Op Debby Hila, MD       And   gentamicin  (GARAMYCIN ) 5 mg/kg in dextrose  5 % 50 mL IVPB  5 mg/kg Intravenous 60 min Pre-Op Debby Hila, MD       sodium chloride  0.9 % 1,000 mL with potassium chloride  20 mEq, magnesium  sulfate 2 g infusion   Intravenous Once Katragadda, Sreedhar, MD        REVIEW OF SYSTEMS:   Constitutional: Denies fevers, chills or abnormal weight loss Eyes: Denies blurriness of vision Ears, nose, mouth, throat,  and face: Denies mucositis or sore throat Respiratory: Denies cough, dyspnea or wheezes Cardiovascular: Denies palpitation, chest discomfort or lower extremity swelling Gastrointestinal:  Denies nausea, heartburn or change in bowel habits Skin: Denies abnormal skin rashes Lymphatics: Denies new lymphadenopathy or easy bruising Neurological:Denies numbness, tingling or new weaknesses Behavioral/Psych: Mood is stable, no new changes  All other systems were reviewed with the patient and are negative.   VITALS:  Last menstrual period 02/16/2015.  Wt Readings from Last 3 Encounters:  01/17/24 123 lb 3.2 oz (55.9 kg)  12/20/23 123 lb 0.3 oz (55.8 kg)  12/13/23 124 lb 9.6 oz (56.5 kg)    There is no height or weight on file to calculate BMI.  Performance status (ECOG): 1 - Symptomatic but completely ambulatory  PHYSICAL EXAM:   GENERAL:alert, no distress and comfortable SKIN: skin color, texture, turgor are normal, no rashes or significant lesions LYMPH:  no palpable lymphadenopathy in the cervical, axillary or inguinal LUNGS: Wheezing bilaterally on ascultation HEART: regular rate & rhythm and no murmurs and no lower extremity edema ABDOMEN:abdomen soft, non-tender and normal bowel sounds Musculoskeletal:no cyanosis of digits and no clubbing  NEURO: alert & oriented x 3 with fluent speech, no focal motor/sensory deficits  LABORATORY DATA:  I have reviewed the data as listed   Lab Results  Component Value Date   WBC 2.2 (L) 01/17/2024   NEUTROABS 1.5 (L) 01/17/2024   HGB 10.7 (L) 01/17/2024   HCT 32.1 (L) 01/17/2024   MCV 105.2 (H) 01/17/2024   PLT  66 (L) 01/17/2024      Chemistry      Component Value Date/Time   NA 137 01/17/2024 0900   NA 140 11/03/2015 0841   K 3.5 01/17/2024 0900   CL 103 01/17/2024 0900   CO2 23 01/17/2024 0900   BUN 10 01/17/2024 0900   BUN 10 11/03/2015 0841   CREATININE 0.78 01/17/2024 0900      Component Value Date/Time   CALCIUM  8.6 (L)  01/17/2024 0900   ALKPHOS 191 (H) 01/17/2024 0900   AST 28 01/17/2024 0900   ALT 12 01/17/2024 0900   BILITOT 1.8 (H) 01/17/2024 0900   BILITOT 2.1 (H) 11/03/2015 0841        Latest Reference Range & Units 12/20/23 08:37  CEA 0.0 - 4.7 ng/mL 6.3 (H)  (H): Data is abnormally high  RADIOGRAPHIC STUDIES: I have personally reviewed the radiological images as listed and agreed with the findings in the report.  NM PET Image Restag (PS) Skull Base To Thigh CLINICAL DATA:  Subsequent treatment strategy for rectal cancer.  EXAM: NUCLEAR MEDICINE PET SKULL BASE TO THIGH  TECHNIQUE: 6.0 mCi F-18 FDG was injected intravenously. Full-ring PET imaging was performed from the skull base to thigh after the radiotracer. CT data was obtained and used for attenuation correction and anatomic localization.  Fasting blood glucose: 93 mg/dl  COMPARISON:  Chest CT 12/12/2023.  PET-CT 09/07/2023.  FINDINGS: Mediastinal blood pool activity: SUV max 2.2  Liver activity: SUV max NA  NECK: No hypermetabolic lymph nodes in the neck.  Incidental CT findings: None.  CHEST: High right paratracheal lymph node of concern on previous chest CT has decreased in the interval measuring 6 mm today (52/302) compared to 8 mm previously. No hypermetabolism.  Small AP window lymph nodes visible on image 58 shows low level FDG uptake. r 7 mm para-aortic lymph nodes seen in the lobe mediastinum (71/302) shows low level hypermetabolism.  Posterior left upper lobe pulmonary nodule of concern on the previous chest CT is hypermetabolic on PET imaging with SUV max = 4.8 (image 19/series 303). SUV max = 2.8 previously.  10 mm left lower lobe para-aortic nodule on 29/303 is hypermetabolic with SUV max = 8.1 (5.6 previously). This nodule is partially incorporated into what appears to be post treatment scarring in the parahilar and retro hilar left lung.  Nodular consolidative opacity in the posterior left lower  lobe on image 32 shows modest hypermetabolism with SUV max = 4.6.  Low level FDG uptake is identified in the right apical scarring with SUV max = 2.2.  Incidental CT findings: Left Port-A-Cath tip is positioned in the mid SVC. 4.7 cm diameter ascending thoracic aorta. Trace left pleural effusion.  ABDOMEN/PELVIS: No abnormal hypermetabolic activity within the pancreas, adrenal glands, or spleen. No hypermetabolic lymph nodes in the abdomen or pelvis.No hypermetabolic liver lesion evident. No hypermetabolism associated with the posterior right liver lesion described on prior studies.  Hypermetabolism associated with the low rectum/anal region is similar at 5.4 today compared to 5.2 previously.  Incidental CT findings: Splenomegaly with stable low-density lesion in the dome.  SKELETON: No focal hypermetabolic activity to suggest skeletal metastasis.  Incidental CT findings: No worrisome lytic or sclerotic osseous abnormality.  IMPRESSION: 1. Hypermetabolic left upper and left lower lobe pulmonary nodules again noted consistent with metastatic disease. No substantial change in size although some lesion show mild interval increase in FDG uptake. 2. Nodular consolidative opacity in the posterior left lower lobe shows modest hypermetabolism. This  may be in part related to treatment related change. 3. Low level FDG uptake associated with the right apical scarring. 4. No evidence for hypermetabolic metastatic disease in the abdomen or pelvis. 5. Stable mild hypermetabolism associated with the low rectum/anal region. 6. No substantial change 4.7 cm diameter ascending thoracic aorta. Close continued follow-up recommended with similar annual imaging indicated.  Electronically Signed   By: Camellia Candle M.D.   On: 01/13/2024 11:10

## 2024-01-17 NOTE — Progress Notes (Signed)
 Patient has been examined by Dr. Davonna. Vital signs and labs have been reviewed by MD - ANC, Creatinine, LFTs (total bilirubin 1.9), hemoglobin, and platelets (66) have been reviewed by M.D. - pt may proceed with treatment.  Primary RN and pharmacy notified.

## 2024-01-17 NOTE — Patient Instructions (Addendum)
 [facility] at [facility] Discharge Instructions   You were seen and examined today by Dr. Davonna.  She reviewed the results of your lab work which are normal/stable.   She reviewed the results of your PET scan. It is showing the rectal area is stable. The spot in the left lung is not bigger but is brighter than on previous scan. This scan is basically interpreted as stable.   We will proceed with your treatment today.   Return as scheduled.    Thank you for choosing  Cancer Center at [facility] to provide your oncology and hematology care.  To afford each patient quality time with our provider, please arrive at least 15 minutes before your scheduled appointment time.   If you have a lab appointment with the Cancer Center please come in thru the Main Entrance and check in at the main information desk.  You need to re-schedule your appointment should you arrive 10 or more minutes late.  We strive to give you quality time with our providers, and arriving late affects you and other patients whose appointments are after yours.  Also, if you no show three or more times for appointments you may be dismissed from the clinic at the providers discretion.     Again, thank you for choosing [facility].  Our hope is that these requests will decrease the amount of time that you wait before being seen by our physicians.       _____________________________________________________________  Should you have questions after your visit to [facility], please contact our office at [phone] and follow the prompts.  Our office hours are 8:00 a.m. and 4:30 p.m. Monday - Friday.  Please note that voicemails left after 4:00 p.m. may not be returned until the following business day.  We are closed weekends and major holidays.  You do have access to a nurse 24-7, just call the main number to the clinic [phone] and do not press any options,  hold on the line and a nurse will answer the phone.    For prescription refill requests, have your pharmacy contact our office and allow 72 hours.    Due to Covid, you will need to wear a mask upon entering the hospital. If you do not have a mask, a mask will be given to you at the Main Entrance upon arrival. For doctor visits, patients may have 1 support person age 70 or older with them. For treatment visits, patients can not have anyone with them due to social distancing guidelines and our immunocompromised population.

## 2024-01-17 NOTE — Progress Notes (Signed)
 Patient presents today for chemotherapy infusion. Patient is in satisfactory condition with no new complaints voiced.  Vital signs are stable.  Labs reviewed by Dr. Davonna during the office visit and all other labs are within treatment parameters. Patient's platelets noted to be 66.  We will proceed with treatment per MD orders.   Treatment given today per MD orders. Tolerated infusion without adverse affects. Vital signs stable. No complaints at this time. Discharged from clinic ambulatory in stable condition. Alert and oriented x 3. F/U with [facility] as scheduled. 5FU ambulatory pump infusing with no alarms beeping.

## 2024-01-17 NOTE — Progress Notes (Signed)
 SPIRITUAL CARE AND COUNSELING CONSULT NOTE   VISIT SUMMARY   Reason for Visit: Chaplain making scheduled follow-up with Pt I previously connected with.  Description of Visit: I entered the room and found Samantha Reynolds sitting in the chair receiving her treatment, with her husband Oneil there as her support person.    Ellora shared that her treatments are very hard on her and it takes her at least 10 days to feel human again.  She has negotiated with the doctors to keep her treatments at 4 weeks.  It's a quality of life issue.  If her treatments are any closer together she isn't able to do much in between.     We talked some about had bad the post-treatment days are and Aalani described that not only are the physical symptoms are extremely difficult, but the spiritual and emotional symptoms are hard as well.  Her husband Oneil comments that he and hsi son work to try to keep her spirits up but it's hard for them as well.  Oneil showed emotion at this point.   After listening and provided compassionate presence for a time, I prayed for Tawnya and exited the room.   I left contact information with Husband, Oneil, offering support where I can.  Plan of Care: I will continue to follow Elveria and Oneil on a Monthly basis.   Maude Roll, MDiv  Chaplain, [facility] Albirta [email or web address] [phone]   SPIRITUAL ENCOUNTER                                                                                                                                                                      Type of Visit: Follow up Care provided to:: Patient, Significant other (Husband Mark) Referral source: Chaplain assessment Reason for visit: Routine spiritual support OnCall Visit: No   SPIRITUAL FRAMEWORK  Presenting Themes: Meaning/purpose/sources of inspiration, Goals in life/care, Coping tools, Impactful experiences and emotions Values/beliefs: [place] values quality of life, being able to  see her grandchildren, and family. She is also a strong [place] Community/Connection: Family, Faith community, Designer, multimedia Strengths: Courage, wisdom, & Spirituality   GOALS   Self/Personal Goals: Continue to live with gratitude and purpose Clinical Care Goals: Maintain her treatments at once a month so that she can have at least 2 weeks to live before next treatment   INTERVENTIONS   Spiritual Care Interventions Made: Compassionate presence, Reflective listening, Narrative/life review, Explored values/beliefs/practices/strengths, Meaning making, Prayer    INTERVENTION OUTCOMES   Outcomes: Reduced isolation, Awareness of support  SPIRITUAL CARE PLAN   Spiritual Care Issues Still Outstanding: Chaplain will continue to follow    Maude Roll, MDiv Chaplain, [facility] [email or web address] [email or web address] [phone]   01/17/2024 11:50 AM

## 2024-01-18 ENCOUNTER — Telehealth: Payer: Self-pay | Admitting: Radiation Oncology

## 2024-01-18 LAB — CEA: CEA: 7.6 ng/mL — ABNORMAL HIGH (ref 0.0–4.7)

## 2024-01-18 NOTE — Telephone Encounter (Signed)
 Telephone note from PA Marrero forwarded via fax to [facility] since pt was under care of Dr. Okey who is no longer with practice.

## 2024-01-18 NOTE — Telephone Encounter (Signed)
 The patient's husband emailed about the patient's recent PET scan findings. She has progressive changes in the area more medially of the lung in the same region she's had ultrahypofractioned treatment in 2023, as well as a newer nodule in the peripheral left lung. Dr. Dewey would offer SBRT in 3-5 fxns to the peripheral site, and 8-10 fractions of ultrahypofractionated radiation. We could proceed with treatment now or wait after another interval scan. She is motivated to delay treatment until after a vacation in February 2026. We will follow up with her next scan and revisit this treatment as an option at that time.   She also had questions about hypermetabolic activity described on her recent PET scan. We went back several scans and discussed that this was present, and appears that the SUV is stable compared to prior scans last year. She is aware that the finding does not likely change her treatment with Dr. Davonna, however she describes over the last few months she's had progressive difficulty with pain and feeling a tearing sensation, sometimes seeing blood after straining to have a bowel movement, and recalls in 2023, that Dr. Golda told her she'd likely need her anus stretched in the future. She will be due for her next colonoscopy in 2026, but has not re-established with GI since Dr. Danielle retirement. I let her know I'd check with Dr. Debby first to see if she would want to see the patient to evaluate her, or if she should start with GI. I'll follow up and let her know what Dr. Debby says.   She is also noticing improvement in the strength of her voice but still has hoarseness that is bothersome to her. She's tried OTC antihistamines, PPI therapy, and while the PPI therapy has helped she has not had as much improvement as she would like since the hoarseness is also accompanied by cough, and vocal fatigue. Her PCP placed a referral but she's not heard about an appt and asks for help with this.   I  will let her doctors know her decisions and concerns, and check back with her in December for her routine appointment to follow up with her brain imaging, and then again in February after her next scan.

## 2024-01-19 ENCOUNTER — Inpatient Hospital Stay

## 2024-01-19 NOTE — Progress Notes (Signed)
 Patient presents today for pump d/c. Vital signs are stable. Port a cath site clean, dry, and intact. Port flushed with 20 mls of normal saline. Needle removed intact. Band aid applied. Patient has no complaints at this time. Discharged from clinic ambulatory and in stable condition. Patient alert and oriented.

## 2024-01-24 ENCOUNTER — Other Ambulatory Visit: Payer: Self-pay | Admitting: *Deleted

## 2024-01-24 ENCOUNTER — Telehealth: Payer: Self-pay | Admitting: *Deleted

## 2024-01-24 DIAGNOSIS — K625 Hemorrhage of anus and rectum: Secondary | ICD-10-CM

## 2024-01-24 DIAGNOSIS — C20 Malignant neoplasm of rectum: Secondary | ICD-10-CM

## 2024-01-24 NOTE — Telephone Encounter (Signed)
 Patient called and has continued having rectal bleeding since Friday. Saturday she described as black in colorand she was dizzy that day. Today it has eased up some, however still passing clots. I suggested ER evaluation, however she declined. Consulted with Dr. Davonna and will do CBCD/BB Sample and CMET tomorrow morning.

## 2024-01-25 ENCOUNTER — Inpatient Hospital Stay

## 2024-01-25 ENCOUNTER — Telehealth: Payer: Self-pay | Admitting: *Deleted

## 2024-01-25 DIAGNOSIS — Z5112 Encounter for antineoplastic immunotherapy: Secondary | ICD-10-CM | POA: Diagnosis not present

## 2024-01-25 DIAGNOSIS — Z5111 Encounter for antineoplastic chemotherapy: Secondary | ICD-10-CM | POA: Diagnosis not present

## 2024-01-25 DIAGNOSIS — K625 Hemorrhage of anus and rectum: Secondary | ICD-10-CM

## 2024-01-25 DIAGNOSIS — C20 Malignant neoplasm of rectum: Secondary | ICD-10-CM

## 2024-01-25 LAB — CBC WITH DIFFERENTIAL/PLATELET
Abs Immature Granulocytes: 0.02 K/uL (ref 0.00–0.07)
Basophils Absolute: 0 K/uL (ref 0.0–0.1)
Basophils Relative: 1 %
Eosinophils Absolute: 0 K/uL (ref 0.0–0.5)
Eosinophils Relative: 1 %
HCT: 34 % — ABNORMAL LOW (ref 36.0–46.0)
Hemoglobin: 11.5 g/dL — ABNORMAL LOW (ref 12.0–15.0)
Immature Granulocytes: 1 %
Lymphocytes Relative: 18 %
Lymphs Abs: 0.7 K/uL (ref 0.7–4.0)
MCH: 34.7 pg — ABNORMAL HIGH (ref 26.0–34.0)
MCHC: 33.8 g/dL (ref 30.0–36.0)
MCV: 102.7 fL — ABNORMAL HIGH (ref 80.0–100.0)
Monocytes Absolute: 0.3 K/uL (ref 0.1–1.0)
Monocytes Relative: 7 %
Neutro Abs: 3.1 K/uL (ref 1.7–7.7)
Neutrophils Relative %: 72 %
Platelets: 91 K/uL — ABNORMAL LOW (ref 150–400)
RBC: 3.31 MIL/uL — ABNORMAL LOW (ref 3.87–5.11)
RDW: 16 % — ABNORMAL HIGH (ref 11.5–15.5)
WBC: 4.2 K/uL (ref 4.0–10.5)
nRBC: 0 % (ref 0.0–0.2)

## 2024-01-25 LAB — COMPREHENSIVE METABOLIC PANEL WITH GFR
ALT: 13 U/L (ref 0–44)
AST: 30 U/L (ref 15–41)
Albumin: 4.2 g/dL (ref 3.5–5.0)
Alkaline Phosphatase: 181 U/L — ABNORMAL HIGH (ref 38–126)
Anion gap: 11 (ref 5–15)
BUN: 12 mg/dL (ref 6–20)
CO2: 24 mmol/L (ref 22–32)
Calcium: 9 mg/dL (ref 8.9–10.3)
Chloride: 102 mmol/L (ref 98–111)
Creatinine, Ser: 0.92 mg/dL (ref 0.44–1.00)
GFR, Estimated: >60 mL/min (ref >60)
Glucose, Bld: 84 mg/dL (ref 70–99)
Potassium: 3.7 mmol/L (ref 3.5–5.1)
Sodium: 138 mmol/L (ref 135–145)
Total Bilirubin: 2.9 mg/dL — ABNORMAL HIGH (ref 0.0–1.2)
Total Protein: 7.1 g/dL (ref 6.5–8.1)

## 2024-01-25 LAB — SAMPLE TO BLOOD BANK

## 2024-01-25 NOTE — Patient Instructions (Signed)
 [facility] [place] - A DEPT OF [facility]  Discharge Instructions: Thank you for choosing [facility] to provide your oncology and hematology care.  If you have a lab appointment with the Cancer Center - please note that after April 8th, 2024, all labs will be drawn in the cancer center.  You do not have to check in or register with the main entrance as you have in the past but will complete your check-in in the cancer center.  Wear comfortable clothing and clothing appropriate for easy access to any Portacath or PICC line.   We strive to give you quality time with your provider. You may need to reschedule your appointment if you arrive late (15 or more minutes).  Arriving late affects you and other patients whose appointments are after yours.  Also, if you miss three or more appointments without notifying the office, you may be dismissed from the clinic at the provider's discretion.      For prescription refill requests, have your pharmacy contact our office and allow 72 hours for refills to be completed.    Today you received the following port flush with blood draw, return as scheduled.   To help prevent nausea and vomiting after your treatment, we encourage you to take your nausea medication as directed.  BELOW ARE SYMPTOMS THAT SHOULD BE REPORTED IMMEDIATELY: *FEVER GREATER THAN 100.4 F (38 C) OR HIGHER *CHILLS OR SWEATING *NAUSEA AND VOMITING THAT IS NOT CONTROLLED WITH YOUR NAUSEA MEDICATION *UNUSUAL SHORTNESS OF BREATH *UNUSUAL BRUISING OR BLEEDING *URINARY PROBLEMS (pain or burning when urinating, or frequent urination) *BOWEL PROBLEMS (unusual diarrhea, constipation, pain near the anus) TENDERNESS IN MOUTH AND THROAT WITH OR WITHOUT PRESENCE OF ULCERS (sore throat, sores in mouth, or a toothache) UNUSUAL RASH, SWELLING OR PAIN  UNUSUAL VAGINAL DISCHARGE OR ITCHING   Items with * indicate a potential emergency and should be followed up as  soon as possible or go to the Emergency Department if any problems should occur.  Please show the CHEMOTHERAPY ALERT CARD or IMMUNOTHERAPY ALERT CARD at check-in to the Emergency Department and triage nurse.  Should you have questions after your visit or need to cancel or reschedule your appointment, please contact [facility] [place] - A DEPT OF [facility] [phone]  and follow the prompts.  Office hours are 8:00 a.m. to 4:30 p.m. Monday - Friday. Please note that voicemails left after 4:00 p.m. may not be returned until the following business day.  We are closed weekends and major holidays. You have access to a nurse at all times for urgent questions. Please call the main number to the clinic [phone] and follow the prompts.  For any non-urgent questions, you may also contact your provider using MyChart. We now offer e-Visits for anyone 80 and older to request care online for non-urgent symptoms. For details visit [email or web address].   Also download the MyChart app! Go to the app store, search MyChart, open the app, select [place], and log in with your MyChart username and password.

## 2024-01-25 NOTE — Telephone Encounter (Signed)
 Called patient with Hgb results 11.5.  States that bleeding has ceased at this time.  Will be following up with Dr. Debby soon and will discuss issues with her at that time.  Dr. Davonna aware and agrees with plan.

## 2024-01-25 NOTE — Progress Notes (Signed)
 Port flushed with good blood return noted. No bruising or swelling at site. Bandaid applied and patient discharged in satisfactory condition. VVS stable with no signs or symptoms of distressed noted.

## 2024-01-31 ENCOUNTER — Ambulatory Visit (INDEPENDENT_AMBULATORY_CARE_PROVIDER_SITE_OTHER)

## 2024-01-31 ENCOUNTER — Encounter (INDEPENDENT_AMBULATORY_CARE_PROVIDER_SITE_OTHER): Payer: Self-pay

## 2024-01-31 ENCOUNTER — Other Ambulatory Visit (HOSPITAL_COMMUNITY): Payer: Self-pay

## 2024-01-31 VITALS — BP 101/72 | HR 104 | Ht 66.5 in | Wt 125.0 lb

## 2024-01-31 DIAGNOSIS — R053 Chronic cough: Secondary | ICD-10-CM

## 2024-01-31 DIAGNOSIS — R0982 Postnasal drip: Secondary | ICD-10-CM

## 2024-01-31 DIAGNOSIS — R490 Dysphonia: Secondary | ICD-10-CM

## 2024-01-31 MED ORDER — FLUTICASONE PROPIONATE 50 MCG/ACT NA SUSP
2.0000 | Freq: Every day | NASAL | 6 refills | Status: AC
Start: 2024-01-31 — End: ?
  Filled 2024-01-31: qty 16, 30d supply, fill #0
  Filled 2024-03-06: qty 16, 30d supply, fill #1

## 2024-01-31 MED ORDER — AZELASTINE HCL 0.1 % NA SOLN
2.0000 | Freq: Two times a day (BID) | NASAL | 12 refills | Status: AC
  Filled 2024-01-31: qty 30, 30d supply, fill #0
  Filled 2024-03-06: qty 30, 30d supply, fill #1
  Filled 2024-04-04: qty 30, 30d supply, fill #0

## 2024-01-31 NOTE — Progress Notes (Signed)
 HPI:   Discussed the use of AI scribe software for clinical note transcription with the patient, who gave verbal consent to proceed.  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 Patient presents with hoarseness and cough.  Patient has an extensive oncologic history and was diagnosed with rectal cancer initially in 2019.   She has been on maintenance chemotherapy since June or July of 2024, resulting in chemo-induced reflux. Initially prescribed Protonix  40 mg, which caused diarrhea, the dose was reduced to 20 mg. This adjustment has alleviated some symptoms, including hoarseness and cough, though she still experiences a persistent sensation of phlegm and occasional coughing fits.  Her voice was previously very weak, making phone conversations difficult, but has improved with the current treatment. However, she still experiences a 'tickle' in her throat not consistently related to any specific activity. Physical activity, such as household chores, exacerbates her cough.  Her voice is generally better in the morning and worsens by late afternoon. She experiences a dry cough and occasionally spits up clear phlegm, with rare instances of slight discoloration. No throat pain or significant difficulty breathing. She has tried Claritin without significant improvement.    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   Worse in her 14s when she was ill   H/O Hashimoto thyroiditis    History of chemotherapy    History of radiation therapy    History of thyroid  cancer no recurrence   2013--  s/p  left lobe thyroidectomy--  follicular varient papillary / lymphocyctic thyroiditis   Hyperlipidemia  11/09/2015   Hypothyroidism    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HCC) 02/26/2018   Past Surgical History:  Procedure Laterality Date   BIOPSY  02/19/2018   Procedure: BIOPSY;  Surgeon: Golda Claudis PENNER, MD;  Location: [facility];  Service: Endoscopy;;  rectum   COLONOSCOPY N/A 08/20/2015   Procedure: COLONOSCOPY;  Surgeon: Claudis PENNER Golda, MD;  Location: [facility];  Service: Endoscopy;  Laterality: N/A;  730   COLONOSCOPY WITH PROPOFOL  N/A 07/21/2021   Procedure: COLONOSCOPY WITH PROPOFOL ;  Surgeon: Golda Claudis PENNER, MD;  Location: [facility];  Service: Endoscopy;  Laterality: N/A;  10:10   D & C HYSTEROOSCOPY W/ THERMACHOICE ENDOMETRIAL ABLATION  08-13-2004   DIVERTING ILEOSTOMY N/A 12/07/2018   Procedure: DIVERTING LOOP ILEOSTOMY;  Surgeon: Debby Hila, MD;  Location: [facility];  Service: General;  Laterality: N/A;   FLEXIBLE SIGMOIDOSCOPY N/A 02/19/2018   Procedure: FLEXIBLE SIGMOIDOSCOPY;  Surgeon: Golda Claudis PENNER, MD;  Location: [facility];  Service: Endoscopy;  Laterality: N/A;   HYSTEROSCOPY WITH D & C N/A 04/30/2015   Procedure: DILATATION AND CURETTAGE / INTENDED HYSTEROSCOPY;  Surgeon: Rosaline Cobble, MD;  Location: [facility] [place];  Service: Gynecology;  Laterality: N/A;   ILEOSTOMY CLOSURE N/A 04/17/2019   Procedure: LOOP ILEOSTOMY REVERSAL;  Surgeon: Debby Hila, MD;  Location: [facility];  Service: General;  Laterality: N/A;   IR US  GUIDE BX ASP/DRAIN  03/09/2018   LAPAROSCOPIC CHOLECYSTECTOMY  04/1996   LAPAROSCOPY N/A 12/11/2018   Procedure: LAPAROSCOPY  ILEOSTOMY REVISION AND ABDOMINAL WASHOUT;  Surgeon: Debby Hila, MD;  Location: [facility];  Service: General;  Laterality: N/A;  Liver Microwave   10/17/2018   POLYPECTOMY  08/20/2015   Procedure: POLYPECTOMY;  Surgeon: Claudis RAYMOND Rivet, MD;  Location: [facility];  Service: Endoscopy;;  Splenic flexure polypectomy   POLYPECTOMY  02/19/2018   Procedure: POLYPECTOMY;  Surgeon: Rivet Claudis RAYMOND, MD;  Location:  [facility];  Service: Endoscopy;;  rectum   PORTACATH PLACEMENT N/A 03/14/2018   Procedure: INSERTION PORT-A-CATH;  Surgeon: Mavis Anes, MD;  Location: [facility];  Service: General;  Laterality: N/A;   REDUCTION INCARCERATED UTERUS  06-20-2000   intrauterine preg. 13 wks /  urinary retention   THYROID  LOBECTOMY  11/24/2011   Procedure: THYROID  LOBECTOMY;  Surgeon: Krystal CHRISTELLA Spinner, MD;  Location: [facility];  Service: General;  Laterality: Left;  Left Thyroid  Lobectomy   TUBAL LIGATION  2002   XI ROBOTIC ASSISTED LOWER ANTERIOR RESECTION N/A 12/07/2018   Procedure: XI ROBOTIC ASSISTED LOWER ANTERIOR RESECTION, SPENIC FLEXURE IMMOBILIZATION, COLOANAL ANASTOMOSIS, RIGID PROCTOSCOPY;  Surgeon: Debby Hila, MD;  Location: [facility];  Service: General;  Laterality: N/A;   No family history of bleeding disorders, wound healing problems or difficulty with anesthesia.  Social Connections: Socially Integrated (02/26/2018)   Social Connection and Isolation Panel    Frequency of Communication with Friends and Family: More than three times a week    Frequency of Social Gatherings with Friends and Family: Twice a week    Attends Religious Services: More than 4 times per year    Active Member of [employer] or Organizations: Yes    Attends Engineer, Structural: More than 4 times per year    Marital Status: Married    Current Outpatient Medications:    ALPRAZolam  (XANAX ) 0.5 MG tablet, Take 1 tablet (0.5 mg total) by mouth 3 (three) times daily as needed for anxiety., Disp: 90 tablet, Rfl: 5   amitriptyline  (ELAVIL ) 10 MG tablet, Take 1 tablet (10 mg total) by mouth 2 (two) times daily., Disp: 180 tablet, Rfl: 3   ARMOUR THYROID  60 MG tablet, Take 1 tablet (60 mg total) by mouth daily., Disp: 90 tablet, Rfl: 2   benzonatate  (TESSALON ) 100 MG capsule, Take 1 capsule (100 mg total) by mouth 2 (two) times daily as needed for cough., Disp: 20 capsule, Rfl: 2   loperamide  (IMODIUM ) 2 MG capsule, Take 1 capsule (2  mg total) by mouth 3 (three) times daily. (Patient taking differently: Take 2 mg by mouth daily.), Disp: 30 capsule, Rfl: 0   pantoprazole  (PROTONIX ) 20 MG tablet, Take 1 tablet (20 mg total) by mouth daily., Disp: 90 tablet, Rfl: 0   prochlorperazine  (COMPAZINE ) 10 MG tablet, Take 1 tablet (10 mg total) by mouth every 6 (six) hours as needed for nausea or vomiting., Disp: 30 tablet, Rfl: 4   thyroid  (ARMOUR THYROID ) 30 MG tablet, Take 1 tablet (30 mg total) by mouth daily before breakfast every Monday., Disp: 21 tablet, Rfl: 5 No current facility-administered medications for this visit.  Facility-Administered Medications Ordered in Other Visits:    clindamycin  (CLEOCIN ) 900 mg in dextrose  5 % 50 mL IVPB, 900 mg, Intravenous, 60 min Pre-Op **AND** gentamicin  (GARAMYCIN ) 350 mg in dextrose  5 % 50 mL IVPB, 5 mg/kg, Intravenous, 60 min Pre-Op, Debby Hila, MD   clindamycin  (CLEOCIN ) 900 mg in dextrose  5 % 50 mL IVPB, 900 mg, Intravenous, 60 min Pre-Op **AND** gentamicin  (GARAMYCIN ) 5 mg/kg in dextrose  5 % 50 mL IVPB, 5 mg/kg, Intravenous, 60 min Pre-Op, Debby Hila, MD   sodium chloride  0.9 % 1,000  mL with potassium chloride  20 mEq, magnesium  sulfate 2 g infusion, , Intravenous, Once, Katragadda, Sreedhar, MD A complete ROS was performed with pertinent positives/negatives noted in the HPI. The remainder of the ROS are negative.   Physical Exam:  LMP 02/16/2015 (Approximate)  General: Well developed, well nourished. No acute distress. Voice hoarse Head/Face: Normocephalic. No sinus tenderness. Facial nerve intact and equal bilaterally. No facial lacerations. Eyes: PERRL, no scleral icterus or conjunctival hemorrhage. EOMI. Ears: No gross deformity. Normal external canal. Tympanic membrane in tact bilaterally Hearing: Normal speech reception.  Nose: No gross deformity or lesions. No purulent discharge. No turbinate hypertrophy. Mouth/Oropharynx: Lips without any lesions. Dentition good. No  mucosal lesions within the oropharynx. No tonsillar enlargement, exudate, or lesions. Pharyngeal walls symmetrical. Uvula midline. Tongue midline without lesions. Larynx: See TFL if applicable Nasopharynx: See TFL if applicable Neck: Trachea midline. No masses. No thyromegaly or nodules palpated. No crepitus. Lymphatic: No lymphadenopathy in the neck. Respiratory: No stridor or distress. Room air. Cardiovascular: Regular rate and rhythm. Extremities: No edema or cyanosis. Warm and well-perfused. Skin: No scars or lesions on face or neck. Neurologic: CN II-XII grossly intact. Moving all extremities without gross abnormality. Other:  Independent Review of Additional Tests or Records: None  Procedures: Flexible Fiberoptic Laryngoscopy Procedure Note  Date of procedure 01/31/2024 Pre-Op Diagnosis: hoarseness    Post Op Diagnosis: same Procedure: Flexible Fiberoptic Laryngoscopy CPT 68424 - Mod 25 Surgeon: Adah Malkin, D.O. Anesthesia: 4% Lidocaine  with Afrin Findings:  - bilateral true vocal folds mobile - no masses or lesions Procedure Detail: After verbal consent was obtained from the patient, the patient was brought in an upright position, a fiberoptic nasal laryngoscope was then passed into the patient right nasal passage and left nasal passage, the left appeared to be more patent. It was passed along the floor of the nasal cavity to the nasopharynx. Torus tubarius was patent and the Fossa of Rosenmller was identified. The scope was then flexed caudally and advanced slowly through the nasopharynx, passed through the oropharynx, and down into the hypopharynx. The patient's oro- and nasopharynx were unremarkable with no signs of any gross lesions, edema, masses, or bleeding.  The base of tongue was visualized and no mass, ulceration or lesion was appreciated. The epiglottis did not demonstrate any mass, ulceration or lesion. The vallecula was also assessed with no mass, ulceration or  lesion. The patient had good glottic closure upon phonation and no signs of aspiration or pooling of secretions. The patient was asked to inspire and expire with the true vocal folds vibrating normally and without evidence of vocal fold dysfunction. The true and false vocal cords, interarytenoid, AE folds, and arytenoids did not demonstrate any significant edema or erythema. The patient was then asked to valsalva, and the pyriform sinuses were assessed which were unremarkable. The airway was patent and there was no evidence of compromise. The scope was then slowly withdrawn from the patient. The patient tolerated the procedure well and there were no complications.  Disposition: Stable  Impression & Plans: Samantha Reynolds is a 58 y.o. female with chronic cough and hoarseness. 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   Assessment & Plan Chronic cough, throat irritation, and hoarseness due to postnasal drainage and chemotherapy-induced gastroesophageal reflux disease Symptoms likely due to postnasal drainage and chemotherapy-induced gastroesophageal reflux.  - Scope exam today without any masses or lesions - Prescribe Flonase nasal spray once daily in the morning. -  Prescribe Astelin nasal spray once daily in the morning, optionally at night. - Advise to swallow or sip water  instead of coughing or clearing throat to reduce vocal cord irritation   Follow-up as needed.  Adah Malkin, DO [facility]

## 2024-02-01 ENCOUNTER — Other Ambulatory Visit: Payer: Self-pay

## 2024-02-08 ENCOUNTER — Other Ambulatory Visit: Payer: Self-pay | Admitting: Oncology

## 2024-02-08 DIAGNOSIS — C20 Malignant neoplasm of rectum: Secondary | ICD-10-CM

## 2024-02-14 ENCOUNTER — Encounter: Payer: Self-pay | Admitting: Oncology

## 2024-02-14 ENCOUNTER — Inpatient Hospital Stay: Attending: Hematology

## 2024-02-14 ENCOUNTER — Inpatient Hospital Stay

## 2024-02-14 ENCOUNTER — Other Ambulatory Visit (HOSPITAL_COMMUNITY): Payer: Self-pay

## 2024-02-14 ENCOUNTER — Inpatient Hospital Stay (HOSPITAL_BASED_OUTPATIENT_CLINIC_OR_DEPARTMENT_OTHER): Admitting: Oncology

## 2024-02-14 VITALS — HR 107 | Ht 66.5 in | Wt 121.8 lb

## 2024-02-14 VITALS — BP 115/73 | HR 94 | Temp 97.8°F | Resp 20

## 2024-02-14 DIAGNOSIS — Z79890 Hormone replacement therapy (postmenopausal): Secondary | ICD-10-CM | POA: Diagnosis not present

## 2024-02-14 DIAGNOSIS — T451X5A Adverse effect of antineoplastic and immunosuppressive drugs, initial encounter: Secondary | ICD-10-CM

## 2024-02-14 DIAGNOSIS — E876 Hypokalemia: Secondary | ICD-10-CM

## 2024-02-14 DIAGNOSIS — C7951 Secondary malignant neoplasm of bone: Secondary | ICD-10-CM | POA: Insufficient documentation

## 2024-02-14 DIAGNOSIS — E278 Other specified disorders of adrenal gland: Secondary | ICD-10-CM | POA: Insufficient documentation

## 2024-02-14 DIAGNOSIS — C7801 Secondary malignant neoplasm of right lung: Secondary | ICD-10-CM | POA: Insufficient documentation

## 2024-02-14 DIAGNOSIS — Z5111 Encounter for antineoplastic chemotherapy: Secondary | ICD-10-CM | POA: Diagnosis not present

## 2024-02-14 DIAGNOSIS — C20 Malignant neoplasm of rectum: Secondary | ICD-10-CM

## 2024-02-14 DIAGNOSIS — K521 Toxic gastroenteritis and colitis: Secondary | ICD-10-CM | POA: Diagnosis not present

## 2024-02-14 DIAGNOSIS — R110 Nausea: Secondary | ICD-10-CM | POA: Diagnosis not present

## 2024-02-14 DIAGNOSIS — Z885 Allergy status to narcotic agent status: Secondary | ICD-10-CM | POA: Insufficient documentation

## 2024-02-14 DIAGNOSIS — D696 Thrombocytopenia, unspecified: Secondary | ICD-10-CM

## 2024-02-14 DIAGNOSIS — G939 Disorder of brain, unspecified: Secondary | ICD-10-CM | POA: Diagnosis not present

## 2024-02-14 DIAGNOSIS — K029 Dental caries, unspecified: Secondary | ICD-10-CM | POA: Insufficient documentation

## 2024-02-14 DIAGNOSIS — Z79899 Other long term (current) drug therapy: Secondary | ICD-10-CM | POA: Diagnosis not present

## 2024-02-14 DIAGNOSIS — K219 Gastro-esophageal reflux disease without esophagitis: Secondary | ICD-10-CM

## 2024-02-14 DIAGNOSIS — C787 Secondary malignant neoplasm of liver and intrahepatic bile duct: Secondary | ICD-10-CM

## 2024-02-14 DIAGNOSIS — J984 Other disorders of lung: Secondary | ICD-10-CM | POA: Diagnosis not present

## 2024-02-14 DIAGNOSIS — Z880 Allergy status to penicillin: Secondary | ICD-10-CM | POA: Diagnosis not present

## 2024-02-14 DIAGNOSIS — Z881 Allergy status to other antibiotic agents status: Secondary | ICD-10-CM | POA: Diagnosis not present

## 2024-02-14 DIAGNOSIS — C7971 Secondary malignant neoplasm of right adrenal gland: Secondary | ICD-10-CM | POA: Insufficient documentation

## 2024-02-14 DIAGNOSIS — C779 Secondary and unspecified malignant neoplasm of lymph node, unspecified: Secondary | ICD-10-CM | POA: Insufficient documentation

## 2024-02-14 DIAGNOSIS — Z5112 Encounter for antineoplastic immunotherapy: Secondary | ICD-10-CM | POA: Insufficient documentation

## 2024-02-14 DIAGNOSIS — Y842 Radiological procedure and radiotherapy as the cause of abnormal reaction of the patient, or of later complication, without mention of misadventure at the time of the procedure: Secondary | ICD-10-CM | POA: Insufficient documentation

## 2024-02-14 DIAGNOSIS — R062 Wheezing: Secondary | ICD-10-CM | POA: Diagnosis not present

## 2024-02-14 DIAGNOSIS — R7989 Other specified abnormal findings of blood chemistry: Secondary | ICD-10-CM | POA: Diagnosis not present

## 2024-02-14 DIAGNOSIS — Z932 Ileostomy status: Secondary | ICD-10-CM | POA: Insufficient documentation

## 2024-02-14 LAB — COMPREHENSIVE METABOLIC PANEL WITH GFR
ALT: 11 U/L (ref 0–44)
AST: 30 U/L (ref 15–41)
Albumin: 4 g/dL (ref 3.5–5.0)
Alkaline Phosphatase: 227 U/L — ABNORMAL HIGH (ref 38–126)
Anion gap: 13 (ref 5–15)
BUN: 10 mg/dL (ref 6–20)
CO2: 23 mmol/L (ref 22–32)
Calcium: 9 mg/dL (ref 8.9–10.3)
Chloride: 102 mmol/L (ref 98–111)
Creatinine, Ser: 0.76 mg/dL (ref 0.44–1.00)
GFR, Estimated: >60 mL/min (ref >60)
Glucose, Bld: 105 mg/dL — ABNORMAL HIGH (ref 70–99)
Potassium: 3.3 mmol/L — ABNORMAL LOW (ref 3.5–5.1)
Sodium: 137 mmol/L (ref 135–145)
Total Bilirubin: 2.6 mg/dL — ABNORMAL HIGH (ref 0.0–1.2)
Total Protein: 7.1 g/dL (ref 6.5–8.1)

## 2024-02-14 LAB — CBC WITH DIFFERENTIAL/PLATELET
Abs Immature Granulocytes: 0.01 K/uL (ref 0.00–0.07)
Basophils Absolute: 0 K/uL (ref 0.0–0.1)
Basophils Relative: 0 %
Eosinophils Absolute: 0 K/uL (ref 0.0–0.5)
Eosinophils Relative: 1 %
HCT: 34.5 % — ABNORMAL LOW (ref 36.0–46.0)
Hemoglobin: 11.5 g/dL — ABNORMAL LOW (ref 12.0–15.0)
Immature Granulocytes: 0 %
Lymphocytes Relative: 16 %
Lymphs Abs: 0.5 K/uL — ABNORMAL LOW (ref 0.7–4.0)
MCH: 34.6 pg — ABNORMAL HIGH (ref 26.0–34.0)
MCHC: 33.3 g/dL (ref 30.0–36.0)
MCV: 103.9 fL — ABNORMAL HIGH (ref 80.0–100.0)
Monocytes Absolute: 0.3 K/uL (ref 0.1–1.0)
Monocytes Relative: 9 %
Neutro Abs: 2.4 K/uL (ref 1.7–7.7)
Neutrophils Relative %: 74 %
Platelets: 77 K/uL — ABNORMAL LOW (ref 150–400)
RBC: 3.32 MIL/uL — ABNORMAL LOW (ref 3.87–5.11)
RDW: 15.5 % (ref 11.5–15.5)
WBC: 3.3 K/uL — ABNORMAL LOW (ref 4.0–10.5)
nRBC: 0 % (ref 0.0–0.2)

## 2024-02-14 LAB — MAGNESIUM: Magnesium: 2.2 mg/dL (ref 1.7–2.4)

## 2024-02-14 MED ORDER — ALBUTEROL SULFATE HFA 108 (90 BASE) MCG/ACT IN AERS: 2.0000 | INHALATION_SPRAY | Freq: Four times a day (QID) | RESPIRATORY_TRACT | Status: DC | PRN

## 2024-02-14 MED ORDER — DEXAMETHASONE SODIUM PHOSPHATE 10 MG/ML IJ SOLN
10.0000 mg | Freq: Once | INTRAMUSCULAR | Status: AC
  Administered 2024-02-14: 10 mg via INTRAVENOUS

## 2024-02-14 MED ORDER — FLUOROURACIL CHEMO INJECTION 500 MG/10ML
320.0000 mg/m2 | Freq: Once | INTRAVENOUS | Status: AC
  Administered 2024-02-14: 500 mg via INTRAVENOUS
  Filled 2024-02-14: qty 10

## 2024-02-14 MED ORDER — SODIUM CHLORIDE 0.9 % IV SOLN
5.0000 mg/kg | Freq: Once | INTRAVENOUS | Status: AC
  Administered 2024-02-14: 300 mg via INTRAVENOUS
  Filled 2024-02-14: qty 12

## 2024-02-14 MED ORDER — SODIUM CHLORIDE 0.9 % IV SOLN
3100.0000 mg | INTRAVENOUS | Status: DC
  Administered 2024-02-14: 3100 mg via INTRAVENOUS
  Filled 2024-02-14: qty 62

## 2024-02-14 MED ORDER — NEBULIZER SYSTEM ALL-IN-ONE MISC
0 refills | Status: DC
  Filled 2024-02-14: qty 1, fill #0

## 2024-02-14 MED ORDER — DEXAMETHASONE SOD PHOSPHATE PF 10 MG/ML IJ SOLN
10.0000 mg | Freq: Once | INTRAMUSCULAR | Status: DC
  Filled 2024-02-14: qty 1

## 2024-02-14 MED ORDER — SODIUM CHLORIDE 0.9 % IV SOLN
320.0000 mg/m2 | Freq: Once | INTRAVENOUS | Status: AC
  Administered 2024-02-14: 540 mg via INTRAVENOUS
  Filled 2024-02-14: qty 27

## 2024-02-14 MED ORDER — SODIUM CHLORIDE 0.9 % IV SOLN: Freq: Once | INTRAVENOUS | Status: AC

## 2024-02-14 MED ORDER — PALONOSETRON HCL INJECTION 0.25 MG/5ML
0.2500 mg | Freq: Once | INTRAVENOUS | Status: AC
  Administered 2024-02-14: 0.25 mg via INTRAVENOUS
  Filled 2024-02-14: qty 5

## 2024-02-14 MED ORDER — FAMOTIDINE IN NACL 20-0.9 MG/50ML-% IV SOLN
20.0000 mg | Freq: Once | INTRAVENOUS | Status: AC
  Administered 2024-02-14: 20 mg via INTRAVENOUS
  Filled 2024-02-14: qty 50

## 2024-02-14 MED ORDER — ALBUTEROL SULFATE (2.5 MG/3ML) 0.083% IN NEBU
2.5000 mg | INHALATION_SOLUTION | Freq: Four times a day (QID) | RESPIRATORY_TRACT | 12 refills | Status: DC | PRN
  Filled 2024-02-14: qty 75, 7d supply, fill #0

## 2024-02-14 NOTE — Progress Notes (Signed)
 Patient Care Team: Alphonsa Glendia LABOR, MD as PCP - General (Family Medicine)  Clinic Day:  02/14/2024  Referring physician: Alphonsa Glendia LABOR, MD   CHIEF COMPLAINT:  CC: Metastatic rectal carcinoma  ASSESSMENT & PLAN:   Assessment & Plan: Samantha Reynolds  is a 58 y.o. 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  Initially diagnosed in 2019.  Extensive oncology history below NGS: KRAS/NRAS: Wild-type, BRAF: Negative Patient had chemotherapy with FOLFOX and panitumumab .  S/p APR with aggravating ileostomy. Multiple SBRT to liver, lung and C6 vertebral lesion Also had brain metastasis for which she underwent SRS Recent CEA uptrending.  Increasing lung nodule on PET scan.  - Patient discussed SBRT with radiation oncology and would like to do it after her cruise trip in February.  Patient also requested for chemotherapy break during radiation for fatigue  - Uptrending CEA likely consistent with enlarging lung nodule. - Will continue PET scans every 3 months.  Next one due 03/2024.  Might delay based on patient's chemotherapy schedule. - Will continue MRI brain every 3 months.  Scheduled for 03/04/2024 -Labs reviewed today: CMP: Normal creatinine, alkaline phosphatase elevated at 227, potassium: 3.3, total bilirubin: 2.6.  CBC: WBC: 3.3, hemoglobin: 11.5, platelets: 77. Latest CEA: 7.7-slightly elevated from prior. - Physical exam stable today.  Patient does not report any complaints today.  Will proceed with chemotherapy today. -Continue 5-FU and Avastin  every 4 weeks. (Dose reduced per patient's wishes).  Patient would like to change the schedule to align with her cruise.  Will make the necessary changes as requested by the patient. -Patient is planning to see Dr. Debby for possible anal stretching   Return to clinic in 6 weeks for follow-up to align with chemotherapy schedule as  per patient request  Chemotherapy induced nausea  Patient reports significant improvement in nausea.  No episodes of vomiting.  Chemotherapy-induced diarrhea Most often occurrence during the first week after chemotherapy.  Patient is mostly homebound during this period.   - Continue Imodium  as prescribed.  Gastroesophageal reflux disease, unspecified whether esophagitis present Chronic cough and hoarseness potentially related to GERD.  Pepcid  discontinued due to side effects.  20 mg pantoprazole  has been effective so far.  - Continue pantoprazole  20 mg for GERD. - Evaluated by ENT and was started on Flonase .  Thrombocytopenia Platelet count stable at 77,000. - Continue current management.  Hypokalemia Potassium supplementation planned during chemotherapy. - Administer potassium supplementation during chemotherapy treatment.  Wheezing and cough Likely due to post-nasal drip and weather changes. Nasal sprays provided some relief. - Prescribed albuterol  inhaler for symptomatic relief.  Dental caries Requires crown and three fillings. No significant bleeding risk expected with the procedure, but caution advised due to Avastin .  - Completed her dental procedures    The patient understands the plans discussed today and is in agreement with them.  She knows to contact our office if she develops concerns prior to her next appointment.  30 minutes of total time was spent for this patient encounter, including preparation,review of records,  face-to-face counseling with the patient and coordination of care, physical exam, and documentation of the encounter.    [name], MD  [facility] [place] - A DEPT OF [facility] [address] [place] [address] Dept: [phone] Dept Fax: [phone]   No orders of the defined types were placed in  this encounter.    ONCOLOGY HISTORY:   I have reviewed her chart and  materials related to her cancer extensively and collaborated history with the patient. Summary of oncologic history is as follows:   Stage IV rectal adenocarcinoma with liver metastasis:  -Presentation with intermittent rectal bleeding and weight loss of 20lbs in 3 months -02/19/2018: Flexible sigmoidoscopy: Rectal mass 2 to 4 cm from anal verge. The mass was noncircumferential and located predominantly at the posterior bowel wall.   -02/19/2018:Rectum biopsy: Adenocarcinoma -02/21/2018: MR pelvis: Rectal adenocarcinoma: T3, N1 equivocal N2 nodes. -02/21/2018: CT CAP: Rectal wall thickening, likely representing the primary lesion.  Perirectal nodes of maximally 7 mm, suspicious for nodal metastasis.  Isolated 9 mm left lower lobe pulmonary nodule.  Right hepatic lobe hypoattenuation, including central more nodular component and peripheral more wedge-shaped component. -03/05/2018: PET scan: Hypermetabolic rectal mass consistent with known neoplasm.  Small but hypermetabolic left perirectal and sigmoid mesocolon lymph nodes.  Single hepatic metastatic lesion in the right hepatic lobe.  7 mm pulmonary nodule at the left lung base is suspicious for metastasis. -03/08/2018: MRI liver: 1.5 cm lesion in the posterior right hepatic lobe, highly concerning for metastatic disease. -03/14/2018-06/27/2018: FOLFOX + Panitumumab  (7 cycles). Panitumumab  (cycles 3-6) discontinued after 6 cycles for severe rash.  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  Persistent but significantly improved radiotracer uptake associated with rectal neoplasm. -06/25/2018: MR liver: Hepatic metastasis in  the right hepatic lobe with decreased significantly in size and demonstrate no perceptible enhancement. -07/20/2018-09/06/2018: Chemo RT to rectum/pelvis with Capecitabine  -09/27/2018: CT CAP: Stable post treatment findings of rectal neoplasm, metastatic liver lesion and presumed metastatic left lung nodule- stable. No evidence of new metastatic disease in the chest, abdomen, or pelvis. -10/17/2018: Right liver lesion microwave ablation  -12/07/2018: Low anterior resection and diverting ileostomy , Colon resection:  - Invasive adenocarcinoma, well-differentiated, scattered foci over 2.0 cm span - Adenocarcinoma extends into but not through the muscularis propria - The surgical resection margins are negative for carcinoma - No evidence of carcinoma in 0/4 lymph nodes -ypT2, ypN0. -p-MMR -12/11/2018: CT CAP: Postoperative changes from low APR with colo anal anastomosis and right lower quadrant ileostomy.Previously treated liver metastases within posterior right lobe of liver appears increased in size.  -12/2018-03/2019: CT CAP: Stable -07/10/2019: CT CAP: A left lower lobe pulmonary nodule is felt to have enlarged.  New inferior right hepatic lobe hypoattenuating lesion, most consistent with metastatic disease. -07/16/2019: MRI abdomen: Two enhancing lesions in the right hepatic lobe seen on recent CT are consistent with metastatic disease.No other evidence of metastatic disease in the abdomen.  -08/15/2019: Microwave ablation of the liver lesion -10/21/2019: CT CAP: Pulmonary nodules in the chest, largest in the LEFT lower lobe has enlarged since the prior study. Interval microwave ablation of lesions in the RIGHT hepatic lobe with NEW LEFT hepatic lobe lesion compatible with additional site of metastasis. -11/05/2019: MR liver: Isolated left hepatic metastasis, as on prior CT. Treated metastasis within the right hepatic lobe, without residual or recurrent disease. -11/15/2019-11/22/2019: SBRT to  lung lesion -12/04/2019-12/13/2019: SBRT to liver lesion -01/22/2020: CT CAP: Evaluation of a new lesion of the inferior left lobe of the liver, previously better characterized by MR, is limited by the  placement of adjacent metallic fiducial marking clips, however this lesion does appear to have somewhat diminished and decreased in density compared to prior examinations, consistent with treatment response to radiation therapy.Redemonstrated nonenhancing low-attenuation ablation sites of the inferior right lobe of the liver, hepatic segment VI and posterior liver dome, hepatic segment VII. No evidence of local recurrence at these sites. Interval enlargement of a nodule of the medial right pulmonary apex. Interval decrease in size of an irregular nodule of the dependent left lung base. -04/01/2020:CT CAP: Stable exam -07/01/2020: CT CAP: Interval enlargement of a pulmonary nodule of the medial right lung apex measuring 0.9 x 0.7 cm, previously 0.7 x 0.5 cm, highly suspicious for pulmonary metastatic disease.Interval increase in somewhat nodular consolidation of the dependent medial left lung base, measuring approximately 2.6 x 1.1 cm.  -08/06/2020-08/13/2020: SBRT to RUL lung lesion -11/06/2020: CT CAP: Stable disease -05/12/2021: CT CAP: Progression in pulmonary metastatic disease, namely in the left lower lobe. Likely associated increase in size of a subcarinal lymph node. -08/09/2021: CT chest: Enlarging LEFT lower lobe pulmonary nodule and tubular and irregular areas of nodularity and discrete nodularity at the medial aspect of the RIGHT lower lobe. Findings suspicious for worsening of disease and endobronchial dissemination of tumor. Decreasing size of RIGHT hepatic lobe metastasis.  -08/19/2021: PET scan: Multifocal left lung nodules, compatible with metastases. Radiation changes in the right lung apex. Associated subcarinal nodal metastasis. Treated metastasis in the posterior right hepatic  lobe. -09/11/2021-10/05/2021: SBRT left lung lesion -01/06/2022: CT CAP:  Interval response to therapy with decreased size of the dominant left infrahilar pulmonary nodule and subcarinal lymph node. New part solid subpleural density in the left lower lobe is likely related to interval therapy. The treated right upper lobe lesion is unchanged. Slowly enlarging solid nodule posteriorly in the left upper lobe, suspicious for a metastasis. No other suspicious nodules. The treated hepatic metastases are stable to decreased in size. No new or enlarging lesions. No evidence of progressive metastatic disease in the abdomen or pelvis. -05/26/2022: PET: Hypermetabolic LEFT upper lobe nodule along the fissures concerning for pulmonary metastasis. Interval resolution of hypermetabolic metastasis in the LEFT lower lobe, hilum, and mediastinum.There is evidence of NEW METASTATIC DISEASE to the RIGHT adrenal gland and recurrent metastatic malignancy in the LEFT hepatic lobe of the liver. New activity in this C6 vertebral body is consistent with new skeletal metastasis. - 06/09/2022: Liver, biopsy: - Metastatic moderately differentiated colonic adenocarcinoma -06/27/2022: [facility] NGS: KRAS/NRAS: Not detected, BRAF: Not detected.  MSI-stable, P-MMR, TMB: Low, 3mut/Mb.  - NTRK 1/2/3, RET, BRAF, eGFR, ERBB2, NF1, PD-L1, PIK 3 CA, POLE, PTEN: Negative -07/01/2022: MRI cervical spine: Osseous metastatic disease in the C6 vertebral body extending to the right posterior elements with extraosseous tumor along the posterior endplate and extending into the right C5-C6 and C6-C7 neural foramina, likely affecting the exiting C6 and C7 nerve roots. -07/06/2022-10/26/2022: FOLFOX + Bevacizumab .  -07/14/2022-08/03/2022: XRT to C6 vertebral lesion -08/18/2022: MRI brain:  3 mm nodular enhancing lesion within the anterior right frontal lobe white matter consistent with a metastasis. 4 mm enhancing focus within the left frontal  calvarium -08/31/2022: SRS to the brain lesion -11/03/2022: PET scan: Overall improvement in most of the metastatic areas -11/09/2022-Current: Maintenance 5FU + Bevacizumab    -01/25/2023-current: Changed to every 4 weeks infusion for tolerability and convenience. -11/2022-04/17/2023: PET scan every 3 months with no evidence of new disease -11/30/2022-Current: MRI brain every 3 months: NED -09/07/2023: PET: Decreasing low-level uptake along the rectum with  surgical changes. Increased size and uptake of a nodule in the superior segment left lower lobe medially worrisome for a developing lung metastasis. Other areas of lung nodularity with low-level uptake are stable including some ill-defined areas in the left lower lobe and right lung apex and more defined area in the left upper lobe.  -11/01/2023: MR Brain with and without Contrast: No evidence of metastatic disease to the brain. Stable small focal area of contrast enhancement within the left frontal bone  along the inner table of the skull.  -12/12/2023: CT Chest with Contrast:  13 mm nodule seen previously in the medial right apex has become incorporated into more confluent architectural distortion and consolidative opacity suggesting post radiation scarring. Patchy consolidative disease seen previously in the retro hilar left ower lobe has become more confluent in the interval with air bronchograms. This may be related to evolving post treatment scarring although superimposed infection is not excluded. 10 mm posterior left upper lobe pulmonary nodule was approximately 8 mm previously. Increasing right paratracheal lymph node. 9 mm ill-defined hypoattenuating lesion in the medial right liver was not well seen on previous PET-CT but has decreased compared to previous contrast infused CT of 05/13/2022. Stable 2 cm right adrenal nodule. Splenomegaly with similar appearance of cystic lesion anteriorly. 4.6 cm diameter ascending thoracic aorta.  -01/11/2024:  PET Scan: Hypermetabolic left upper and left lower lobe pulmonary nodules again noted consistent with metastatic disease. No substantial change in size although some lesion show mild interval increase in FDG uptake. Nodular consolidative opacity in the posterior left lower lobe shows modest hypermetabolism. This may be in part related to treatment related change. Low level FDG uptake associated with the right apical scarring. No evidence for hypermetabolic metastatic disease in the abdomen or pelvis. Stable mild hypermetabolism associated with the low rectum/anal region.    Current Treatment:  Maintenance 5FU + Bevacizumab  every 4 weeks  INTERVAL HISTORY:   Discussed the use of AI scribe software for clinical note transcription with the patient, who gave verbal consent to proceed.  History of Present Illness TEOSHA CASSO is a 58 year old female with metastatic rectal cancer who presents for follow-up and chemotherapy.  She is accompanied by her husband today.  She is coordinating her treatment schedule around a planned cruise in February, aiming to delay her radiation treatment to March. Her last PET scan was on October 16, and she is considering scheduling the next one in January before her trip.  She experiences persistent diarrhea, which she attributes to her medication, Protonix . Her bowel movements are consistently loose and irregular.  She has a history of wheezing and coughing, which she attributes to post-nasal drip. She uses two nasal sprays that provide some relief, but the wheezing persists, especially in colder weather. She is considering using albuterol  as needed to manage the wheezing.  She mentions having pulled a muscle from coughing and is seeking ways to alleviate her symptoms. She has recently undergone dental work, including a new crown and three fillings, with no bleeding reported during the procedures.    I have reviewed the past medical history, past surgical history,  social history and family history with the patient and they are unchanged from previous note.  ALLERGIES:  is allergic to oxaliplatin , demerol  [meperidine ], penicillins, levaquin  [levofloxacin ], other, and vancomycin .  MEDICATIONS:  Current Outpatient Medications  Medication Sig Dispense Refill   ALPRAZolam  (XANAX ) 0.5 MG tablet Take 1 tablet (0.5 mg total) by mouth 3 (three) times daily as  needed for anxiety. 90 tablet 5   amitriptyline  (ELAVIL ) 10 MG tablet Take 1 tablet (10 mg total) by mouth 2 (two) times daily. 180 tablet 3   ARMOUR THYROID  60 MG tablet Take 1 tablet (60 mg total) by mouth daily. 90 tablet 2   azelastine  (ASTELIN ) 0.1 % nasal spray Place 2 sprays into both nostrils 2 (two) times daily. Use in each nostril as directed 30 mL 12   benzonatate  (TESSALON ) 100 MG capsule Take 1 capsule (100 mg total) by mouth 2 (two) times daily as needed for cough. 20 capsule 2   fluticasone  (FLONASE ) 50 MCG/ACT nasal spray Place 2 sprays into both nostrils daily. 16 g 6   loperamide  (IMODIUM ) 2 MG capsule Take 1 capsule (2 mg total) by mouth 3 (three) times daily. (Patient taking differently: Take 2 mg by mouth daily.) 30 capsule 0   pantoprazole  (PROTONIX ) 20 MG tablet Take 1 tablet (20 mg total) by mouth daily. 90 tablet 0   prochlorperazine  (COMPAZINE ) 10 MG tablet Take 1 tablet (10 mg total) by mouth every 6 (six) hours as needed for nausea or vomiting. 30 tablet 4   thyroid  (ARMOUR THYROID ) 30 MG tablet Take 1 tablet (30 mg total) by mouth daily before breakfast every Monday. 21 tablet 5   No current facility-administered medications for this visit.   Facility-Administered Medications Ordered in Other Visits  Medication Dose Route Frequency Provider Last Rate Last Admin   clindamycin  (CLEOCIN ) 900 mg in dextrose  5 % 50 mL IVPB  900 mg Intravenous 60 min Pre-Op Debby Hila, MD       And   gentamicin  (GARAMYCIN ) 350 mg in dextrose  5 % 50 mL IVPB  5 mg/kg Intravenous 60 min Pre-Op  Debby Hila, MD       clindamycin  (CLEOCIN ) 900 mg in dextrose  5 % 50 mL IVPB  900 mg Intravenous 60 min Pre-Op Debby Hila, MD       And   gentamicin  (GARAMYCIN ) 5 mg/kg in dextrose  5 % 50 mL IVPB  5 mg/kg Intravenous 60 min Pre-Op Debby Hila, MD       sodium chloride  0.9 % 1,000 mL with potassium chloride  20 mEq, magnesium  sulfate 2 g infusion   Intravenous Once Katragadda, Sreedhar, MD        REVIEW OF SYSTEMS:   Constitutional: Denies fevers, chills or abnormal weight loss Eyes: Denies blurriness of vision Ears, nose, mouth, throat, and face: Denies mucositis  Respiratory: Denies cough, dyspnea,  wheezes + Cardiovascular: Denies palpitation, chest discomfort or lower extremity swelling Gastrointestinal:  Denies nausea, heartburn or change in bowel habits Skin: Denies abnormal skin rashes Lymphatics: Denies new lymphadenopathy or easy bruising Neurological:Denies numbness, tingling or new weaknesses Behavioral/Psych: Mood is stable, no new changes   All other systems were reviewed with the patient and are negative.   VITALS:  Last menstrual period 02/16/2015.  Wt Readings from Last 3 Encounters:  01/31/24 125 lb (56.7 kg)  01/25/24 121 lb 11.1 oz (55.2 kg)  01/17/24 123 lb 3.2 oz (55.9 kg)    There is no height or weight on file to calculate BMI.  Performance status (ECOG): 1 - Symptomatic but completely ambulatory  PHYSICAL EXAM:   GENERAL:alert, no distress and comfortable SKIN: skin color, texture, turgor are normal, no rashes or significant lesions LYMPH:  no palpable lymphadenopathy in the cervical, axillary or inguinal LUNGS: Wheezing bilaterally on ascultation HEART: regular rate & rhythm and no murmurs and no lower extremity edema ABDOMEN:abdomen soft, non-tender  and normal bowel sounds Musculoskeletal:no cyanosis of digits and no clubbing  NEURO: alert & oriented x 3 with fluent speech   LABORATORY DATA:  I have reviewed the data as listed   Lab  Results  Component Value Date   WBC 4.2 01/25/2024   NEUTROABS 3.1 01/25/2024   HGB 11.5 (L) 01/25/2024   HCT 34.0 (L) 01/25/2024   MCV 102.7 (H) 01/25/2024   PLT 91 (L) 01/25/2024      Chemistry      Component Value Date/Time   NA 138 01/25/2024 0822   NA 140 11/03/2015 0841   K 3.7 01/25/2024 0822   CL 102 01/25/2024 0822   CO2 24 01/25/2024 0822   BUN 12 01/25/2024 0822   BUN 10 11/03/2015 0841   CREATININE 0.92 01/25/2024 0822      Component Value Date/Time   CALCIUM  9.0 01/25/2024 0822   ALKPHOS 181 (H) 01/25/2024 0822   AST 30 01/25/2024 0822   ALT 13 01/25/2024 0822   BILITOT 2.9 (H) 01/25/2024 0822   BILITOT 2.1 (H) 11/03/2015 0841      Latest Reference Range & Units 02/14/24 09:39  CEA 0.0 - 4.7 ng/mL 7.7 (H)  (H): Data is abnormally high  RADIOGRAPHIC STUDIES: I have personally reviewed the radiological images as listed and agreed with the findings in the report.  None new to review

## 2024-02-14 NOTE — Progress Notes (Signed)
 Message received from A. Lenon RN / Dr. Davonna proceed with treatment. Total Bilirubin 2.6. Disregard per A. Lenon RN / Dr. Davonna.

## 2024-02-14 NOTE — Progress Notes (Signed)
 Patient has been examined by Dr. Davonna. Vital signs and labs have been reviewed by MD - ANC, Creatinine, LFTs (2.5 total bili), hemoglobin, and platelets (77) have been reviewed by M.D. - pt may proceed with treatment.  Primary RN and pharmacy notified.

## 2024-02-14 NOTE — Progress Notes (Signed)
 Treatment given today per MD orders.  Tolerated infusion without adverse affects.  Vital signs stable.  No complaints at this time.  Discharge from clinic ambulatory in stable condition.  Alert and oriented X 3.  Follow up with [facility] as scheduled.   Treatment given today per MD orders.  Stable during infusion without adverse affects.  Vital signs stable.  5FU pump connected and verified RUN on the screen with the patient.  No complaints at this time.  Discharge from clinic ambulatory in stable condition.  Alert and oriented X 3.  Follow up with [facility] as scheduled.

## 2024-02-14 NOTE — Patient Instructions (Signed)
 [facility] [place] - A DEPT OF [facility]  Discharge Instructions: Thank you for choosing [facility] to provide your oncology and hematology care.  If you have a lab appointment with the Cancer Center - please note that after April 8th, 2024, all labs will be drawn in the cancer center.  You do not have to check in or register with the main entrance as you have in the past but will complete your check-in in the cancer center.  Wear comfortable clothing and clothing appropriate for easy access to any Portacath or PICC line.   We strive to give you quality time with your provider. You may need to reschedule your appointment if you arrive late (15 or more minutes).  Arriving late affects you and other patients whose appointments are after yours.  Also, if you miss three or more appointments without notifying the office, you may be dismissed from the clinic at the provider's discretion.      For prescription refill requests, have your pharmacy contact our office and allow 72 hours for refills to be completed.    Today you received the following chemotherapy and/or immunotherapy agents R-CHOP      To help prevent nausea and vomiting after your treatment, we encourage you to take your nausea medication as directed.  BELOW ARE SYMPTOMS THAT SHOULD BE REPORTED IMMEDIATELY: *FEVER GREATER THAN 100.4 F (38 C) OR HIGHER *CHILLS OR SWEATING *NAUSEA AND VOMITING THAT IS NOT CONTROLLED WITH YOUR NAUSEA MEDICATION *UNUSUAL SHORTNESS OF BREATH *UNUSUAL BRUISING OR BLEEDING *URINARY PROBLEMS (pain or burning when urinating, or frequent urination) *BOWEL PROBLEMS (unusual diarrhea, constipation, pain near the anus) TENDERNESS IN MOUTH AND THROAT WITH OR WITHOUT PRESENCE OF ULCERS (sore throat, sores in mouth, or a toothache) UNUSUAL RASH, SWELLING OR PAIN  UNUSUAL VAGINAL DISCHARGE OR ITCHING   Items with * indicate a potential emergency and should be followed up as  soon as possible or go to the Emergency Department if any problems should occur.  Please show the CHEMOTHERAPY ALERT CARD or IMMUNOTHERAPY ALERT CARD at check-in to the Emergency Department and triage nurse.  Should you have questions after your visit or need to cancel or reschedule your appointment, please contact [facility] [place] - A DEPT OF [facility]igh[facility] [phone]  and follow the prompts.  Office hours are 8:00 a.m. to 4:30 p.m. Monday - Friday. Please note that voicemails left after 4:00 p.m. may not be returned until the following business day.  We are closed weekends and major holidays. You have access to a nurse at all times for urgent questions. Please call the main number to the clinic [phone] and follow the prompts.  For any non-urgent questions, you may also contact your provider using MyChart. We now offer e-Visits for anyone 7 and older to request care online for non-urgent symptoms. For details visit [email or web address].   Also download the MyChart app! Go to the app store, search "MyChart", open the app, select [place], and log in with your MyChart username and password.

## 2024-02-15 ENCOUNTER — Other Ambulatory Visit (HOSPITAL_BASED_OUTPATIENT_CLINIC_OR_DEPARTMENT_OTHER): Payer: Self-pay

## 2024-02-15 ENCOUNTER — Other Ambulatory Visit: Payer: Self-pay | Admitting: *Deleted

## 2024-02-15 ENCOUNTER — Other Ambulatory Visit (HOSPITAL_COMMUNITY): Payer: Self-pay

## 2024-02-15 ENCOUNTER — Other Ambulatory Visit: Payer: Self-pay

## 2024-02-15 DIAGNOSIS — R062 Wheezing: Secondary | ICD-10-CM

## 2024-02-15 LAB — CEA: CEA: 7.7 ng/mL — ABNORMAL HIGH (ref 0.0–4.7)

## 2024-02-15 MED ORDER — ALBUTEROL SULFATE (2.5 MG/3ML) 0.083% IN NEBU
2.5000 mg | INHALATION_SOLUTION | Freq: Four times a day (QID) | RESPIRATORY_TRACT | 12 refills | Status: DC | PRN
  Filled 2024-02-15: qty 75, 7d supply, fill #0

## 2024-02-15 MED ORDER — ALBUTEROL SULFATE (2.5 MG/3ML) 0.083% IN NEBU
2.5000 mg | INHALATION_SOLUTION | Freq: Four times a day (QID) | RESPIRATORY_TRACT | 12 refills | Status: AC | PRN
  Filled 2024-02-15: qty 75, 7d supply, fill #0

## 2024-02-15 MED ORDER — NEBULIZER SYSTEM ALL-IN-ONE MISC: 0 refills | Status: AC

## 2024-02-16 ENCOUNTER — Inpatient Hospital Stay

## 2024-02-16 ENCOUNTER — Encounter: Payer: Self-pay | Admitting: Oncology

## 2024-02-16 ENCOUNTER — Encounter (HOSPITAL_COMMUNITY): Payer: Self-pay | Admitting: Oncology

## 2024-02-16 VITALS — BP 124/80 | HR 96 | Temp 97.4°F | Resp 18

## 2024-02-16 DIAGNOSIS — C20 Malignant neoplasm of rectum: Secondary | ICD-10-CM

## 2024-02-16 DIAGNOSIS — C7971 Secondary malignant neoplasm of right adrenal gland: Secondary | ICD-10-CM | POA: Diagnosis not present

## 2024-02-16 DIAGNOSIS — C7951 Secondary malignant neoplasm of bone: Secondary | ICD-10-CM | POA: Diagnosis not present

## 2024-02-16 DIAGNOSIS — D696 Thrombocytopenia, unspecified: Secondary | ICD-10-CM | POA: Diagnosis not present

## 2024-02-16 DIAGNOSIS — C787 Secondary malignant neoplasm of liver and intrahepatic bile duct: Secondary | ICD-10-CM | POA: Diagnosis not present

## 2024-02-16 DIAGNOSIS — C7801 Secondary malignant neoplasm of right lung: Secondary | ICD-10-CM | POA: Diagnosis not present

## 2024-02-16 DIAGNOSIS — C779 Secondary and unspecified malignant neoplasm of lymph node, unspecified: Secondary | ICD-10-CM | POA: Diagnosis not present

## 2024-02-16 DIAGNOSIS — Z5111 Encounter for antineoplastic chemotherapy: Secondary | ICD-10-CM | POA: Diagnosis not present

## 2024-02-16 DIAGNOSIS — R7989 Other specified abnormal findings of blood chemistry: Secondary | ICD-10-CM | POA: Diagnosis not present

## 2024-02-16 DIAGNOSIS — Z5112 Encounter for antineoplastic immunotherapy: Secondary | ICD-10-CM | POA: Diagnosis not present

## 2024-02-16 NOTE — Progress Notes (Signed)
 Patient presents today for 5FU pump stop and disconnection after 46 hour continous infusion.   5FU pump deaccessed.  Patients port flushed without difficulty.  Good blood return noted with no bruising or swelling noted at site.  Needle removed intact.  Band aid applied.  VSS with discharge and left in satisfactory condition via wheelchair with no s/s of distress noted.

## 2024-02-16 NOTE — Patient Instructions (Signed)

## 2024-03-01 NOTE — Progress Notes (Signed)
 Follow up call to discuss results of brain MRI from 03/04/24.   No pain or other issues today per patient.  IMPRESSION: 1. Stable MRI appearance of the brain. No metastatic disease or acute intracranial abnormality.

## 2024-03-04 ENCOUNTER — Ambulatory Visit (HOSPITAL_COMMUNITY): Admission: RE | Admit: 2024-03-04 | Source: Ambulatory Visit

## 2024-03-05 ENCOUNTER — Other Ambulatory Visit: Payer: Self-pay | Admitting: Oncology

## 2024-03-05 ENCOUNTER — Other Ambulatory Visit: Payer: Self-pay | Admitting: Radiation Oncology

## 2024-03-05 ENCOUNTER — Ambulatory Visit (HOSPITAL_COMMUNITY)
Admission: RE | Admit: 2024-03-05 | Discharge: 2024-03-05 | Disposition: A | Source: Ambulatory Visit | Attending: Radiation Oncology | Admitting: Radiation Oncology

## 2024-03-05 DIAGNOSIS — C20 Malignant neoplasm of rectum: Secondary | ICD-10-CM

## 2024-03-05 DIAGNOSIS — R9082 White matter disease, unspecified: Secondary | ICD-10-CM | POA: Diagnosis not present

## 2024-03-05 DIAGNOSIS — C7931 Secondary malignant neoplasm of brain: Secondary | ICD-10-CM

## 2024-03-05 DIAGNOSIS — Z85048 Personal history of other malignant neoplasm of rectum, rectosigmoid junction, and anus: Secondary | ICD-10-CM | POA: Diagnosis not present

## 2024-03-05 MED ORDER — GADOBUTROL 1 MMOL/ML IV SOLN
5.5000 mL | Freq: Once | INTRAVENOUS | Status: AC | PRN
  Administered 2024-03-05: 5.5 mL via INTRAVENOUS

## 2024-03-06 ENCOUNTER — Other Ambulatory Visit: Payer: Self-pay | Admitting: Oncology

## 2024-03-06 ENCOUNTER — Other Ambulatory Visit: Payer: Self-pay

## 2024-03-06 ENCOUNTER — Other Ambulatory Visit (HOSPITAL_BASED_OUTPATIENT_CLINIC_OR_DEPARTMENT_OTHER): Payer: Self-pay

## 2024-03-06 ENCOUNTER — Other Ambulatory Visit (HOSPITAL_COMMUNITY): Payer: Self-pay

## 2024-03-06 DIAGNOSIS — C20 Malignant neoplasm of rectum: Secondary | ICD-10-CM

## 2024-03-07 ENCOUNTER — Other Ambulatory Visit: Payer: Self-pay

## 2024-03-08 ENCOUNTER — Other Ambulatory Visit (HOSPITAL_COMMUNITY): Payer: Self-pay

## 2024-03-08 ENCOUNTER — Other Ambulatory Visit: Payer: Self-pay | Admitting: Oncology

## 2024-03-08 DIAGNOSIS — C20 Malignant neoplasm of rectum: Secondary | ICD-10-CM

## 2024-03-11 ENCOUNTER — Encounter

## 2024-03-11 ENCOUNTER — Ambulatory Visit
Admission: RE | Admit: 2024-03-11 | Discharge: 2024-03-11 | Attending: Radiation Oncology | Admitting: Radiation Oncology

## 2024-03-11 ENCOUNTER — Inpatient Hospital Stay

## 2024-03-11 DIAGNOSIS — C20 Malignant neoplasm of rectum: Secondary | ICD-10-CM

## 2024-03-11 DIAGNOSIS — C787 Secondary malignant neoplasm of liver and intrahepatic bile duct: Secondary | ICD-10-CM

## 2024-03-11 DIAGNOSIS — C7931 Secondary malignant neoplasm of brain: Secondary | ICD-10-CM

## 2024-03-11 NOTE — Progress Notes (Signed)
 Radiation Oncology         [phone] ________________________________  Outpatient Follow Up- Conducted via telephone at patient request.  I spoke with the patient to conduct this  visit via telephone. The patient was notified in advance and was offered an in person or telemedicine meeting to allow for face to face communication but instead preferred to proceed with a telephone visit.   Name: Samantha Reynolds MRN: [id number]  Date of Service: 03/11/2024  DOB: 1965/04/05   Diagnosis:  Progressive Metastatic Adenocarcinoma of the rectum.  Prior Radiation:    08/31/22  SRS Treatment [id number] was treated to 20 Gy in 1 fraction is SRS technique  07/14/22-08/03/22: Site: Cervical Spine Technique: 3D Mode: Photon Dose Per Fraction: 2.5 Gy Prescribed Dose (Delivered / Prescribed): 37.5 Gy / 37.5 Gy Prescribed Fxs (Delivered / Prescribed): 15 / 15  09/21/2021 through 10/05/2021 Ultrahypofractionated  Treatment Site Technique Total Dose (Gy) Dose per Fx (Gy) Completed Fx Beam Energies  Lung, Left: Multifocal Left lung and Subcarinal node IMRT 50/50 5 10/10 6XFFF    08/06/20-08/13/20:  SBRT Treatment The tumor in the RUL was treated with a course of stereotactic body radiation treatment. The patient received 54 Gy In 3 fractions at 18 G per fraction.  12/04/2019-12/13/2019: SBRT Treatment The patient was treated to the liver with a course of stereotactic body radiation treatment.  The patient received 60 Gray in 5 fractions using a SBRT/IMRT technique, with 3 fields.  11/15/19 - 11/22/19:  SBRT Treatment The patient was treated to the left lower lobe with a course of stereotactic body radiation treatment.  The patient received 54 Gray in 3 fractions using a SBRT/IMRT technique, with 3 fields.  07/30/2018-09/06/2018: The rectum was treated to 45 Gy in 25 fractions, and 5.4 Gy boost in 3 fractions  Narrative:  The patient is a delightful 58 y.o. female with a history of Stage IV,  [phone] adenocarcinoma of the rectum. She received total neoadjuvant chemotherapy and completed this in April 2020 followed by chemoRT in the spring of 2020. She subsequently underwent robotic LAR with loop ileostomy in September 2020 with 2 cm of residual disease but negative nodes and margins. She had reversal of the ileostomy in January 2021, also microwave ablation to a right hepatic lesion by Dr. Luke at [facility] in May 2021. She had another lesion that was difficult to access for ablation. In the interim of additional imaging, a lesion in the left lung was noted and treated with SBRT in August 2021. She also was able to undergo SBRT to the liver lesion in September 2021.  She developed additional lung disease and was treated  to a RUL target in the lung in May of 2022 with ultrahypofractionated course with multifocal areas in the left lung and subcarinal station in July 2023.  She was found to have progression of disease involving the liver and right adrenal gland by diagnostic imaging.  A liver biopsy on 06/09/2022 confirmed active metastatic malignancy consistent with her rectal primary.  An MRI of the cervical spine on 07/01/2022 was also performed and confirmed a metastatic lesion in the C6 vertebral body extending to the right posterior elements with extraosseous tumor along with posterior endplate extending into the right C5-C6 and C6-C7 neural foramen.  She went on to receive palliative radiation to her cervical spine and during the course of her therapy complained of numbness in her face which prompted an MRI of the brain on 08/18/2022 which showed a 4 mm enhancing  area of focus in the left frontal calvarium as well as a 3 mm nodular enhancing lesion in the anterior right frontal lobe of the brain.  She went on to receive stereotactic radiosurgery which she completed on 08/31/2022.    Over the course of her surveillance for her brain there have been comments on a lesion in the left frontal calvarium which  has remained stable in retrospect, the patient does have a remote history of trauma after a fall during a syncopal event.  This area also is to be followed. She continues with systemic 5-FU, leucovorin  and Avastin  with Dr. Davonna. She had some findings in the left lung along the SBRT in 2023 and a newer nodule in the periphery of the left lung.  She was offered SBRT to the peripheral lesion and ultra hypofractionated radiation to the site in 2023 that was treated but wished to hold off and see if her systemic treatment would make any impacts.  She is scheduled to have her next PET scan at the end of January and we will follow up with her next PET scan in January and is comfortable moving forward if this scan indicates a role for radiation. She had an MRI of the brain on 03/05/24 that showed no new disease in the brain or concerns for any acute findings. She is contacted today over the phone to review her MRI results.  On review of systems, she continues to have some cough and hoarseness and finds results with Tylenol  Severe Sinus and the protonix  and Flonase  and Astelin  which has helped. She has only had one episode this past month of discomfort with passing stool that she had described the last time we spoke she has an appointment later this month with Dr. Debby to evaluate her symptoms.  She also continues to have LAR symptoms and has been only stools in the day that keep her from being able to leave the house in the morning.  She is still able to work remotely. She has occasional headaches but no persistent or progressive symptoms.  No other complaints are verbalized.      PAST MEDICAL HISTORY:  Past Medical History:  Diagnosis Date   Anxiety    Cancer (HCC) 2013   thyroid    Endometrial polyp    Endometrial thickening on ultra sound    GERD (gastroesophageal reflux disease)    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   Worse in her 71s when she was ill   H/O Hashimoto thyroiditis    History of chemotherapy     History of radiation therapy    History of thyroid  cancer no recurrence   2013--  s/p  left lobe thyroidectomy--  follicular varient papillary / lymphocyctic thyroiditis   Hyperlipidemia 11/09/2015   Hypothyroidism    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HCC) 02/26/2018    PAST SURGICAL HISTORY: Past Surgical History:  Procedure Laterality Date   BIOPSY  02/19/2018   Procedure: BIOPSY;  Surgeon: Golda Claudis PENNER, MD;  [facility];  Service: Endoscopy;;  rectum   COLONOSCOPY N/A 08/20/2015   Procedure: COLONOSCOPY;  Surgeon: Claudis PENNER Golda, MD;  [facility];  Service: Endoscopy;  Laterality: N/A;  730   COLONOSCOPY WITH PROPOFOL  N/A 07/21/2021   Procedure: COLONOSCOPY WITH PROPOFOL ;  Surgeon: Golda Claudis PENNER, MD;  [facility];  Service: Endoscopy;  Laterality: N/A;  10:10   D & C HYSTEROOSCOPY W/ THERMACHOICE ENDOMETRIAL ABLATION  08-13-2004   DIVERTING ILEOSTOMY N/A 12/07/2018  Procedure: DIVERTING LOOP ILEOSTOMY;  Surgeon: Debby Hila, MD;  [facility];  Service: General;  Laterality: N/A;   FLEXIBLE SIGMOIDOSCOPY N/A 02/19/2018   Procedure: FLEXIBLE SIGMOIDOSCOPY;  Surgeon: Golda Claudis PENNER, MD;  [facility];  Service: Endoscopy;  Laterality: N/A;   HYSTEROSCOPY WITH D & C N/A 04/30/2015   Procedure: DILATATION AND CURETTAGE / INTENDED HYSTEROSCOPY;  Surgeon: Rosaline Cobble, MD;  Location: [facility];  Service: Gynecology;  Laterality: N/A;   ILEOSTOMY CLOSURE N/A 04/17/2019   Procedure: LOOP ILEOSTOMY REVERSAL;  Surgeon: Debby Hila, MD;  [facility];  Service: General;  Laterality: N/A;   IR US  GUIDE BX ASP/DRAIN  03/09/2018   LAPAROSCOPIC CHOLECYSTECTOMY  04/1996   LAPAROSCOPY N/A 12/11/2018   Procedure: LAPAROSCOPY  ILEOSTOMY REVISION AND ABDOMINAL WASHOUT;  Surgeon: Debby Hila, MD;  [facility];  Service: General;  Laterality: N/A;   Liver Microwave   10/17/2018   POLYPECTOMY  08/20/2015   Procedure:  POLYPECTOMY;  Surgeon: Claudis PENNER Golda, MD;  [facility];  Service: Endoscopy;;  Splenic flexure polypectomy   POLYPECTOMY  02/19/2018   Procedure: POLYPECTOMY;  Surgeon: Golda Claudis PENNER, MD;  [facility];  Service: Endoscopy;;  rectum   PORTACATH PLACEMENT N/A 03/14/2018   Procedure: INSERTION PORT-A-CATH;  Surgeon: Mavis Anes, MD;  [facility];  Service: General;  Laterality: N/A;   REDUCTION INCARCERATED UTERUS  06-20-2000   intrauterine preg. 13 wks /  urinary retention   THYROID  LOBECTOMY  11/24/2011   Procedure: THYROID  LOBECTOMY;  Surgeon: Krystal CHRISTELLA Spinner, MD;  [facility];  Service: General;  Laterality: Left;  Left Thyroid  Lobectomy   TUBAL LIGATION  2002   XI ROBOTIC ASSISTED LOWER ANTERIOR RESECTION N/A 12/07/2018   Procedure: XI ROBOTIC ASSISTED LOWER ANTERIOR RESECTION, SPENIC FLEXURE IMMOBILIZATION, COLOANAL ANASTOMOSIS, RIGID PROCTOSCOPY;  Surgeon: Debby Hila, MD;  [facility];  Service: General;  Laterality: N/A;    PAST SOCIAL HISTORY:  Social History   Socioeconomic History   Marital status: Married    Spouse name: Not on file   Number of children: 4   Years of education: Not on file   Highest education level: Not on file  Occupational History    Comment: Systems Developer at [employer]  Tobacco Use   Smoking status: Former    Current packs/day: 0.00    Average packs/day: 0.3 packs/day for 10.0 years (2.5 ttl pk-yrs)    Types: Cigarettes    Start date: 10/30/1981    Quit date: 10/31/1991    Years since quitting: 32.3   Smokeless tobacco: Never  Vaping Use   Vaping status: Never Used  Substance and Sexual Activity   Alcohol use: No   Drug use: No   Sexual activity: Not Currently  Other Topics Concern   Not on file  Social History Narrative   Lives with husband Anes and 5 year old son Cyndee   Husband is [employer]   Social Drivers of Health   Tobacco Use: Medium Risk (01/31/2024)   Patient History     Smoking Tobacco Use: Former    Smokeless Tobacco Use: Never    Passive Exposure: Not on Actuary Strain: Not on file  Food Insecurity: No Food Insecurity (06/30/2023)   Hunger Vital Sign    Worried About Running Out of Food in the Last Year: Never true    Ran Out of Food in the Last Year:  Never true  Transportation Needs: No Transportation Needs (06/30/2023)   PRAPARE - Administrator, Civil Service (Medical): No    Lack of Transportation (Non-Medical): No  Physical Activity: Not on file  Stress: Not on file  Social Connections: Not on file  Intimate Partner Violence: Not At Risk (06/30/2023)   Humiliation, Afraid, Rape, and Kick questionnaire    Fear of Current or Ex-Partner: No    Emotionally Abused: No    Physically Abused: No    Sexually Abused: No  Depression (PHQ2-9): Low Risk (02/14/2024)   Depression (PHQ2-9)    PHQ-2 Score: 0  Recent Concern: Depression (PHQ2-9) - Medium Risk (12/01/2023)   Depression (PHQ2-9)    PHQ-2 Score: 7  Alcohol Screen: Not on file  Housing: Low Risk (06/30/2023)   Housing Stability Vital Sign    Unable to Pay for Housing in the Last Year: No    Number of Times Moved in the Last Year: 0    Homeless in the Last Year: No  Utilities: Not At Risk (06/30/2023)   AHC Utilities    Threatened with loss of utilities: No  Health Literacy: Not on file    PAST FAMILY HISTORY: Family History  Problem Relation Age of Onset   Hypertension Mother    Hypertension Father    Prostate cancer Father    Cancer Maternal Aunt        spine/back   Cancer Paternal Grandfather        lung   Healthy Son    Healthy Son    Healthy Son    Healthy Son    Colon cancer Neg Hx     MEDICATIONS  Current Outpatient Medications  Medication Sig Dispense Refill   albuterol  (PROVENTIL ) (2.5 MG/3ML) 0.083% nebulizer solution Take 3 mLs (2.5 mg total) by nebulization every 6 (six) hours as needed for wheezing or shortness of breath. 75 mL 12    ALPRAZolam  (XANAX ) 0.5 MG tablet Take 1 tablet (0.5 mg total) by mouth 3 (three) times daily as needed for anxiety. 90 tablet 5   amitriptyline  (ELAVIL ) 10 MG tablet Take 1 tablet (10 mg total) by mouth 2 (two) times daily. 180 tablet 3   ARMOUR THYROID  60 MG tablet Take 1 tablet (60 mg total) by mouth daily. 90 tablet 2   azelastine  (ASTELIN ) 0.1 % nasal spray Place 2 sprays into both nostrils 2 (two) times daily. Use in each nostril as directed 30 mL 12   benzonatate  (TESSALON ) 100 MG capsule Take 1 capsule (100 mg total) by mouth 2 (two) times daily as needed for cough. 20 capsule 2   fluticasone  (FLONASE ) 50 MCG/ACT nasal spray Place 2 sprays into both nostrils daily. 16 g 6   loperamide  (IMODIUM ) 2 MG capsule Take 1 capsule (2 mg total) by mouth 3 (three) times daily. (Patient taking differently: Take 2 mg by mouth daily.) 30 capsule 0   Nebulizer System All-In-One MISC Use as directed. 1 each 0   pantoprazole  (PROTONIX ) 20 MG tablet Take 1 tablet (20 mg total) by mouth daily. 90 tablet 0   prochlorperazine  (COMPAZINE ) 10 MG tablet Take 1 tablet (10 mg total) by mouth every 6 (six) hours as needed for nausea or vomiting. 30 tablet 4   thyroid  (ARMOUR THYROID ) 30 MG tablet Take 1 tablet (30 mg total) by mouth daily before breakfast every Monday. 21 tablet 5   No current facility-administered medications for this encounter.   Facility-Administered Medications Ordered in Other Encounters  Medication Dose Route Frequency Provider Last Rate Last Admin   clindamycin  (CLEOCIN ) 900 mg in dextrose  5 % 50 mL IVPB  900 mg Intravenous 60 min Pre-Op Debby Hila, MD       And   gentamicin  (GARAMYCIN ) 350 mg in dextrose  5 % 50 mL IVPB  5 mg/kg Intravenous 60 min Pre-Op Debby Hila, MD       clindamycin  (CLEOCIN ) 900 mg in dextrose  5 % 50 mL IVPB  900 mg Intravenous 60 min Pre-Op Debby Hila, MD       And   gentamicin  (GARAMYCIN ) 5 mg/kg in dextrose  5 % 50 mL IVPB  5 mg/kg Intravenous 60 min  Pre-Op Debby Hila, MD       sodium chloride  0.9 % 1,000 mL with potassium chloride  20 mEq, magnesium  sulfate 2 g infusion   Intravenous Once Katragadda, Sreedhar, MD        ALLERGIES:  Allergies  Allergen Reactions   Oxaliplatin  Other (See Comments)    Patient had hypersensitivity reaction to Oxaliplatin . Patient c/o itching in her ears and eyes. Pt also c/o being hot, flushed, dizzy with redness on pt's face, arms, and legs. Pt c/o tingling in her hands and legs. Pt also had a dry throat with coughing. See progress note from 09/07/22 at 1130. Patient was not able to complete Oxaliplatin  infusion.   Second reaction 10/26/2022. Itching in ears, heart racing, & face flushing. See progress note from 10/26/2022. Able to complete infusion after medications.   Demerol  [Meperidine ] Itching    All over the body.   Penicillins Hives     childhood, does not remember if it spread all over the body or not. Did it involve swelling of the face/tongue/throat, SOB, or low BP? No Did it involve sudden or severe rash/hives, skin peeling, or any reaction on the inside of your mouth or nose? Yes Did you need to seek medical attention at a hospital or doctor's office? Yes When did it last happen?      childhood allergy If all above answers are NO, may proceed with cephalosporin use.    Levaquin  [Levofloxacin ] Other (See Comments)    Patient has Aortic Aneurysm and is not indicated with this diagnosis   Other Hives and Other (See Comments)    Fresh coconut    Vancomycin  Rash    Will need Benadryl  prior to administration per IV. Red man syndrome   Physical Exam: Wt Readings from Last 3 Encounters:  02/14/24 121 lb 12.8 oz (55.2 kg)  01/31/24 125 lb (56.7 kg)  01/25/24 121 lb 11.1 oz (55.2 kg)   Temp Readings from Last 3 Encounters:  02/16/24 (!) 97.4 F (36.3 C) (Tympanic)  02/14/24 97.8 F (36.6 C) (Oral)  02/14/24 97.8 F (36.6 C) (Oral)   BP Readings from Last 3 Encounters:  02/16/24  124/80  02/14/24 115/73  02/14/24 (!) 144/79   Pulse Readings from Last 3 Encounters:  02/16/24 96  02/14/24 94  02/14/24 (!) 107   Unable to assess due to encounter type   Impression/Plan: 1. Progressive Metastatic Stage IV, [id number], adenocarcinoma of the rectum.  The patient is doing well and we discussed that her MRI remains reassuring.  She is scheduled for her repeat PET scan in January and I will keep an eye out for this and let her know once Dr. Dewey has evaluated her scan, she we will continue systemic therapy with chemo until then.  Regarding her brain disease, since she is doing well and has  had stability over time, we will stretch her MRI to 4 months.  If other evidence of multifocal change were noted we would reconsider this but the patient is comfortable with this at this time. 2. Hoarseness, and dry cough.  The patient has been evaluated by ENT and remains on Protonix  Flonase  Tylenol  severe sinus and Astelin  nasal spray all of these things seem to help and are treating postnasal drip secondary to her chemotherapy regimen as well as chemo induced reflux.  This will be followed expectantly and she can continue to follow-up per ENT.      This encounter was conducted via telephone.  The patient has provided two factor identification and has given verbal consent for this type of encounter and has been advised to only accept a meeting of this type in a secure network environment. The time spent during this encounter was 45 minutes including preparation, discussion, and coordination of the patient's care. The attendants for this meeting include Donald Estefana Husband  and Elveria VEAR Salt   During the encounter,  Donald Estefana Husband was located at [facility] in the Radiation Oncology Department. Samantha Reynolds was located at at home.    Donald KYM Husband, PAC

## 2024-03-13 ENCOUNTER — Ambulatory Visit (HOSPITAL_COMMUNITY)

## 2024-03-13 ENCOUNTER — Inpatient Hospital Stay: Attending: Hematology

## 2024-03-13 ENCOUNTER — Inpatient Hospital Stay

## 2024-03-13 VITALS — BP 132/75 | HR 89 | Temp 97.3°F | Resp 18

## 2024-03-13 DIAGNOSIS — Z79631 Long term (current) use of antimetabolite agent: Secondary | ICD-10-CM | POA: Insufficient documentation

## 2024-03-13 DIAGNOSIS — Z5111 Encounter for antineoplastic chemotherapy: Secondary | ICD-10-CM | POA: Insufficient documentation

## 2024-03-13 DIAGNOSIS — R059 Cough, unspecified: Secondary | ICD-10-CM | POA: Diagnosis not present

## 2024-03-13 DIAGNOSIS — D696 Thrombocytopenia, unspecified: Secondary | ICD-10-CM | POA: Insufficient documentation

## 2024-03-13 DIAGNOSIS — C20 Malignant neoplasm of rectum: Secondary | ICD-10-CM | POA: Insufficient documentation

## 2024-03-13 DIAGNOSIS — K521 Toxic gastroenteritis and colitis: Secondary | ICD-10-CM | POA: Diagnosis not present

## 2024-03-13 DIAGNOSIS — T451X5A Adverse effect of antineoplastic and immunosuppressive drugs, initial encounter: Secondary | ICD-10-CM | POA: Insufficient documentation

## 2024-03-13 DIAGNOSIS — K219 Gastro-esophageal reflux disease without esophagitis: Secondary | ICD-10-CM | POA: Insufficient documentation

## 2024-03-13 DIAGNOSIS — R110 Nausea: Secondary | ICD-10-CM | POA: Diagnosis not present

## 2024-03-13 DIAGNOSIS — R062 Wheezing: Secondary | ICD-10-CM | POA: Diagnosis not present

## 2024-03-13 DIAGNOSIS — Z79899 Other long term (current) drug therapy: Secondary | ICD-10-CM | POA: Diagnosis not present

## 2024-03-13 LAB — CBC WITH DIFFERENTIAL/PLATELET
Abs Immature Granulocytes: 0 K/uL (ref 0.00–0.07)
Basophils Absolute: 0 K/uL (ref 0.0–0.1)
Basophils Relative: 0 %
Eosinophils Absolute: 0 K/uL (ref 0.0–0.5)
Eosinophils Relative: 1 %
HCT: 32.6 % — ABNORMAL LOW (ref 36.0–46.0)
Hemoglobin: 11 g/dL — ABNORMAL LOW (ref 12.0–15.0)
Immature Granulocytes: 0 %
Lymphocytes Relative: 23 %
Lymphs Abs: 0.6 K/uL — ABNORMAL LOW (ref 0.7–4.0)
MCH: 34.3 pg — ABNORMAL HIGH (ref 26.0–34.0)
MCHC: 33.7 g/dL (ref 30.0–36.0)
MCV: 101.6 fL — ABNORMAL HIGH (ref 80.0–100.0)
Monocytes Absolute: 0.2 K/uL (ref 0.1–1.0)
Monocytes Relative: 8 %
Neutro Abs: 1.8 K/uL (ref 1.7–7.7)
Neutrophils Relative %: 68 %
Platelets: 86 K/uL — ABNORMAL LOW (ref 150–400)
RBC: 3.21 MIL/uL — ABNORMAL LOW (ref 3.87–5.11)
RDW: 15.9 % — ABNORMAL HIGH (ref 11.5–15.5)
WBC: 2.6 K/uL — ABNORMAL LOW (ref 4.0–10.5)
nRBC: 0 % (ref 0.0–0.2)

## 2024-03-13 LAB — COMPREHENSIVE METABOLIC PANEL WITH GFR
ALT: 9 U/L (ref 0–44)
AST: 25 U/L (ref 15–41)
Albumin: 4 g/dL (ref 3.5–5.0)
Alkaline Phosphatase: 195 U/L — ABNORMAL HIGH (ref 38–126)
Anion gap: 14 (ref 5–15)
BUN: 14 mg/dL (ref 6–20)
CO2: 22 mmol/L (ref 22–32)
Calcium: 8.7 mg/dL — ABNORMAL LOW (ref 8.9–10.3)
Chloride: 105 mmol/L (ref 98–111)
Creatinine, Ser: 0.75 mg/dL (ref 0.44–1.00)
GFR, Estimated: >60 mL/min (ref >60)
Glucose, Bld: 88 mg/dL (ref 70–99)
Potassium: 3.3 mmol/L — ABNORMAL LOW (ref 3.5–5.1)
Sodium: 140 mmol/L (ref 135–145)
Total Bilirubin: 2.8 mg/dL — ABNORMAL HIGH (ref 0.0–1.2)
Total Protein: 7 g/dL (ref 6.5–8.1)

## 2024-03-13 LAB — MAGNESIUM: Magnesium: 2.1 mg/dL (ref 1.7–2.4)

## 2024-03-13 LAB — URINALYSIS, DIPSTICK ONLY
Bilirubin Urine: NEGATIVE
Glucose, UA: NEGATIVE mg/dL
Hgb urine dipstick: NEGATIVE
Ketones, ur: NEGATIVE mg/dL
Nitrite: NEGATIVE
Protein, ur: 100 mg/dL — AB
Specific Gravity, Urine: 1.027 (ref 1.005–1.030)
pH: 5 (ref 5.0–8.0)

## 2024-03-13 MED ORDER — PALONOSETRON HCL INJECTION 0.25 MG/5ML
0.2500 mg | Freq: Once | INTRAVENOUS | Status: AC
  Administered 2024-03-13: 10:00:00 0.25 mg via INTRAVENOUS
  Filled 2024-03-13: qty 5

## 2024-03-13 MED ORDER — FLUOROURACIL CHEMO INJECTION 500 MG/10ML
320.0000 mg/m2 | Freq: Once | INTRAVENOUS | Status: AC
  Administered 2024-03-13: 11:00:00 500 mg via INTRAVENOUS
  Filled 2024-03-13: qty 10

## 2024-03-13 MED ORDER — FAMOTIDINE IN NACL 20-0.9 MG/50ML-% IV SOLN
20.0000 mg | Freq: Once | INTRAVENOUS | Status: AC
  Administered 2024-03-13: 10:00:00 20 mg via INTRAVENOUS
  Filled 2024-03-13: qty 50

## 2024-03-13 MED ORDER — DEXAMETHASONE SOD PHOSPHATE PF 10 MG/ML IJ SOLN
10.0000 mg | Freq: Once | INTRAMUSCULAR | Status: AC
  Administered 2024-03-13: 10:00:00 10 mg via INTRAVENOUS

## 2024-03-13 MED ORDER — SODIUM CHLORIDE 0.9 % IV SOLN
320.0000 mg/m2 | Freq: Once | INTRAVENOUS | Status: AC
  Administered 2024-03-13: 11:00:00 540 mg via INTRAVENOUS
  Filled 2024-03-13: qty 27

## 2024-03-13 MED ORDER — SODIUM CHLORIDE 0.9 % IV SOLN: Freq: Once | INTRAVENOUS | Status: AC

## 2024-03-13 MED ORDER — POTASSIUM CHLORIDE CRYS ER 20 MEQ PO TBCR
40.0000 meq | EXTENDED_RELEASE_TABLET | Freq: Once | ORAL | Status: AC
  Administered 2024-03-13: 09:00:00 40 meq via ORAL
  Filled 2024-03-13: qty 2

## 2024-03-13 MED ORDER — SODIUM CHLORIDE 0.9 % IV SOLN: 5.0000 mg/kg | Freq: Once | INTRAVENOUS | Status: DC

## 2024-03-13 MED ORDER — SODIUM CHLORIDE 0.9 % IV SOLN
3100.0000 mg | INTRAVENOUS | Status: DC
  Administered 2024-03-13: 11:00:00 3100 mg via INTRAVENOUS
  Filled 2024-03-13: qty 62

## 2024-03-13 NOTE — Patient Instructions (Signed)
 [facility] [place] - A DEPT OF [facility]  Discharge Instructions: Thank you for choosing [facility] to provide your oncology and hematology care.  If you have a lab appointment with the Cancer Center - please note that after April 8th, 2024, all labs will be drawn in the cancer center.  You do not have to check in or register with the main entrance as you have in the past but will complete your check-in in the cancer center.  Wear comfortable clothing and clothing appropriate for easy access to any Portacath or PICC line.   We strive to give you quality time with your provider. You may need to reschedule your appointment if you arrive late (15 or more minutes).  Arriving late affects you and other patients whose appointments are after yours.  Also, if you miss three or more appointments without notifying the office, you may be dismissed from the clinic at the provider's discretion.      For prescription refill requests, have your pharmacy contact our office and allow 72 hours for refills to be completed.    Today you received the following chemotherapy and/or immunotherapy agents leucovorin , adruicil   To help prevent nausea and vomiting after your treatment, we encourage you to take your nausea medication as directed.  BELOW ARE SYMPTOMS THAT SHOULD BE REPORTED IMMEDIATELY: *FEVER GREATER THAN 100.4 F (38 C) OR HIGHER *CHILLS OR SWEATING *NAUSEA AND VOMITING THAT IS NOT CONTROLLED WITH YOUR NAUSEA MEDICATION *UNUSUAL SHORTNESS OF BREATH *UNUSUAL BRUISING OR BLEEDING *URINARY PROBLEMS (pain or burning when urinating, or frequent urination) *BOWEL PROBLEMS (unusual diarrhea, constipation, pain near the anus) TENDERNESS IN MOUTH AND THROAT WITH OR WITHOUT PRESENCE OF ULCERS (sore throat, sores in mouth, or a toothache) UNUSUAL RASH, SWELLING OR PAIN  UNUSUAL VAGINAL DISCHARGE OR ITCHING   Items with * indicate a potential emergency and should be  followed up as soon as possible or go to the Emergency Department if any problems should occur.  Please show the CHEMOTHERAPY ALERT CARD or IMMUNOTHERAPY ALERT CARD at check-in to the Emergency Department and triage nurse.  Should you have questions after your visit or need to cancel or reschedule your appointment, please contact [facility]CER CTR [place] - A DEPT OF [facility] [phone]  and follow the prompts.  Office hours are 8:00 a.m. to 4:30 p.m. Monday - Friday. Please note that voicemails left after 4:00 p.m. may not be returned until the following business day.  We are closed weekends and major holidays. You have access to a nurse at all times for urgent questions. Please call the main number to the clinic [phone] and follow the prompts.  For any non-urgent questions, you may also contact your provider using MyChart. We now offer e-Visits for anyone 59 and older to request care online for non-urgent symptoms. For details visit [email or web address].   Also download the MyChart app! Go to the app store, search "MyChart", open the app, select [place], and log in with your MyChart username and password.

## 2024-03-13 NOTE — Progress Notes (Signed)
 Patient presents today for Leucovorin , Avastin  and 5FU. Heart rate elevated 110 on arrival. Labs pending. Potassium 3.3. Standing orders followed. Platelets 86,000.   Message received from Dr. Davonna to proceed with treatment and bolus of 250 mls of normal saline .

## 2024-03-13 NOTE — Progress Notes (Signed)
 Urine protein is 100 today, will hold avastin  per MD and get a 24 hour urine   Treatment given per orders. Patient tolerated it well without problems. Vitals stable and discharged home from clinic ambulatory. Follow up as scheduled.

## 2024-03-14 LAB — CEA: CEA: 9 ng/mL — ABNORMAL HIGH (ref 0.0–4.7)

## 2024-03-15 ENCOUNTER — Inpatient Hospital Stay

## 2024-03-15 VITALS — BP 121/81 | HR 109 | Temp 97.5°F | Resp 19

## 2024-03-15 DIAGNOSIS — Z5111 Encounter for antineoplastic chemotherapy: Secondary | ICD-10-CM | POA: Diagnosis not present

## 2024-03-15 DIAGNOSIS — C20 Malignant neoplasm of rectum: Secondary | ICD-10-CM

## 2024-03-15 LAB — PROTEIN, URINE, 24 HOUR
Collection Interval-UPROT: 24 h
Protein, 24H Urine: 68 mg/(24.h)
Protein, Urine: 9 mg/dL
Urine Total Volume-UPROT: 750 mL

## 2024-03-15 NOTE — Progress Notes (Signed)
 Patient presents today for pump d/c. Vital signs are stable. Port a cath site clean, dry, and intact. Port flushed with 20 mls of normal saline. Needle removed intact. Band aid applied. Patient has no concerns or questions at this time. Discharged from clinic ambulatory and in stable condition. Patient alert and oriented.

## 2024-03-16 ENCOUNTER — Other Ambulatory Visit (HOSPITAL_COMMUNITY): Payer: Self-pay

## 2024-03-18 ENCOUNTER — Encounter

## 2024-03-19 ENCOUNTER — Inpatient Hospital Stay

## 2024-03-19 ENCOUNTER — Ambulatory Visit: Admitting: Radiation Oncology

## 2024-03-19 DIAGNOSIS — Z5111 Encounter for antineoplastic chemotherapy: Secondary | ICD-10-CM | POA: Diagnosis not present

## 2024-03-19 MED ORDER — SODIUM CHLORIDE 0.9 % IV SOLN: INTRAVENOUS | Status: DC

## 2024-03-19 NOTE — Progress Notes (Signed)
 Patient presents today for 1 liter of normal saline over 1 hour. Vital signs stable. Patient denies any nausea, vomiting, diarrhea. Patient voiced complaints of fatigue. MAR reviewed.

## 2024-03-19 NOTE — Progress Notes (Signed)
 Patient tolerated therapy with no complaints voiced.  Side effects with management reviewed with understanding verbalized.  Port site clean and dry with no bruising or swelling noted at site.  Good blood return noted before and after administration of therapy.  Band aid applied.  Patient left in satisfactory condition with VSS and no s/s of distress noted.

## 2024-03-19 NOTE — Patient Instructions (Signed)
 [facility] - A DEPT OF [facility] [place]  Discharge Instructions: Thank you for choosing [facility] to provide your oncology and hematology care.  If you have a lab appointment with the Cancer Center - please note that after April 8th, 2024, all labs will be drawn in the cancer center.  You do not have to check in or register with the main entrance as you have in the past but will complete your check-in in the cancer center.  Wear comfortable clothing and clothing appropriate for easy access to any Portacath or PICC line.   We strive to give you quality time with your provider. You may need to reschedule your appointment if you arrive late (15 or more minutes).  Arriving late affects you and other patients whose appointments are after yours.  Also, if you miss three or more appointments without notifying the office, you may be dismissed from the clinic at the provider's discretion.      For prescription refill requests, have your pharmacy contact our office and allow 72 hours for refills to be completed.    Today you received the following 1 liter of normal saline, return as scheduled.   To help prevent nausea and vomiting after your treatment, we encourage you to take your nausea medication as directed.  BELOW ARE SYMPTOMS THAT SHOULD BE REPORTED IMMEDIATELY: *FEVER GREATER THAN 100.4 F (38 C) OR HIGHER *CHILLS OR SWEATING *NAUSEA AND VOMITING THAT IS NOT CONTROLLED WITH YOUR NAUSEA MEDICATION *UNUSUAL SHORTNESS OF BREATH *UNUSUAL BRUISING OR BLEEDING *URINARY PROBLEMS (pain or burning when urinating, or frequent urination) *BOWEL PROBLEMS (unusual diarrhea, constipation, pain near the anus) TENDERNESS IN MOUTH AND THROAT WITH OR WITHOUT PRESENCE OF ULCERS (sore throat, sores in mouth, or a toothache) UNUSUAL RASH, SWELLING OR PAIN  UNUSUAL VAGINAL DISCHARGE OR ITCHING   Items with * indicate a potential emergency and should be followed up as  soon as possible or go to the Emergency Department if any problems should occur.  Please show the CHEMOTHERAPY ALERT CARD or IMMUNOTHERAPY ALERT CARD at check-in to the Emergency Department and triage nurse.  Should you have questions after your visit or need to cancel or reschedule your appointment, please contact [facility] [place] - A DEPT OF [facility] [phone]  and follow the prompts.  Office hours are 8:00 a.m. to 4:30 p.m. Monday - Friday. Please note that voicemails left after 4:00 p.m. may not be returned until the following business day.  We are closed weekends and major holidays. You have access to a nurse at all times for urgent questions. Please call the main number to the clinic [phone] and follow the prompts.  For any non-urgent questions, you may also contact your provider using MyChart. We now offer e-Visits for anyone 7 and older to request care online for non-urgent symptoms. For details visit [email or web address].   Also download the MyChart app! Go to the app store, search "MyChart", open the app, select [facility], and log in with your MyChart username and password.

## 2024-03-25 ENCOUNTER — Other Ambulatory Visit (HOSPITAL_COMMUNITY): Payer: Self-pay

## 2024-03-25 ENCOUNTER — Encounter: Payer: Self-pay | Admitting: *Deleted

## 2024-03-25 ENCOUNTER — Other Ambulatory Visit: Payer: Self-pay | Admitting: *Deleted

## 2024-03-25 ENCOUNTER — Other Ambulatory Visit: Payer: Self-pay

## 2024-03-25 DIAGNOSIS — C20 Malignant neoplasm of rectum: Secondary | ICD-10-CM

## 2024-03-25 DIAGNOSIS — K219 Gastro-esophageal reflux disease without esophagitis: Secondary | ICD-10-CM

## 2024-03-25 MED ORDER — PANTOPRAZOLE SODIUM 20 MG PO TBEC
20.0000 mg | DELAYED_RELEASE_TABLET | Freq: Every day | ORAL | 0 refills | Status: AC
  Filled 2024-03-25 (×2): qty 90, 90d supply, fill #0

## 2024-03-25 NOTE — Progress Notes (Signed)
 Confirmed moving treatment one week early due to patient going on a cruise.    Received notification from Dr Davonna to adjust Tbili level in treatment parameters to >3.  Ok to proceed with HR 102.  Niels Molt, PharmD Clinical Pharmacist

## 2024-03-27 ENCOUNTER — Other Ambulatory Visit: Payer: Self-pay | Admitting: Oncology

## 2024-03-27 DIAGNOSIS — C20 Malignant neoplasm of rectum: Secondary | ICD-10-CM

## 2024-04-02 NOTE — Progress Notes (Signed)
 " Patient Care Team: Alphonsa Glendia LABOR, MD as PCP - General (Family Medicine)  Clinic Day:  04/03/2024  Referring physician: Alphonsa Glendia LABOR, MD   CHIEF COMPLAINT:  CC: Metastatic rectal carcinoma  ASSESSMENT & PLAN:   Assessment & Plan: Samantha Reynolds  is a 59 y.o. 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  Initially diagnosed in 2019.  Extensive oncology history below NGS: KRAS/NRAS: Wild-type, BRAF: Negative Patient had chemotherapy with FOLFOX and panitumumab .  S/p APR with aggravating ileostomy. Multiple SBRT to liver, lung and C6 vertebral lesion Also had brain metastasis for which she underwent SRS Recent CEA uptrending.  Increasing lung nodule on PET scan.  - Patient discussed SBRT with radiation oncology and would like to do it after her cruise trip in February.  Patient also requested for chemotherapy break during radiation for fatigue - Uptrending CEA likely consistent with enlarging lung nodule. - Will continue PET scans every 3 months.  Next one due 03/2024. Scheduled for 04/25/2024. - We reviewed the MRI brain findings and there is no evidence of metastatic disease. Will continue MRI brain every 3 months.   -Labs reviewed today: CMP: Normal creatinine, alkaline phosphatase elevated at 194, potassium: 3.5, total bilirubin: 2.5.  CBC: WBC: 1.9, hemoglobin: 10.7, platelets: 66. Latest CEA: 10.4-slightly elevated from prior. - Physical exam stable today.  Patient does not report any complaints today.  Will proceed with chemotherapy today. -Continue 5-FU and Avastin  every 4 weeks. (Dose reduced per patient's wishes).  Patient would like to change the schedule to align with her cruise.  Will make the necessary changes as requested by the patient.   Return to clinic in 4 weeks for follow-up with PET scan  Chemotherapy induced nausea   Resolved  Chemotherapy-induced diarrhea Most often occurrence during the first week after chemotherapy.  Patient is mostly homebound during this period.   - Continue Imodium  as prescribed.  Gastroesophageal reflux disease, unspecified whether esophagitis present Chronic cough and hoarseness potentially related to GERD.  Pepcid  discontinued due to side effects.  20 mg pantoprazole  has been effective so far.  - Continue pantoprazole  20 mg for GERD. - Evaluated by ENT and was started on Flonase .  Thrombocytopenia Platelet count stable at 66,000. - Continue current management.   Wheezing and cough Likely due to post-nasal drip and weather changes. Nasal sprays provided some relief. More in the PM. Cough improved.   Chronic hyperbilirubinemia Chronic elevation of total bilirubin, stable and asymptomatic. Treatment parameters adjusted.  Cancer-related weight loss and anorexia Four-pound weight loss over five months, not clinically significant. Early satiety and decreased appetite, working on nutritional intake. - Scheduled nutritionist consultation for dietary counseling.   The patient understands the plans discussed today and is in agreement with them.  She knows to contact our office if she develops concerns prior to her next appointment.  The total time spent in the appointment was 23 minutes for the encounter with patient, including review of chart and various tests results, discussions about plan of care and coordination of care plan    [name], MD  [facility] [place] - A DEPT OF [facility] [address] [place] [address] Dept: [phone] Dept Fax: [phone]   No orders of the defined types were placed in this encounter.    ONCOLOGY HISTORY:   I have reviewed her chart and materials  related to her cancer extensively and collaborated history with the patient. Summary of oncologic history is  as follows:   Stage IV rectal adenocarcinoma with liver metastasis:  -Presentation with intermittent rectal bleeding and weight loss of 20lbs in 3 months -02/19/2018: Flexible sigmoidoscopy: Rectal mass 2 to 4 cm from anal verge. The mass was noncircumferential and located predominantly at the posterior bowel wall.   -02/19/2018:Rectum biopsy: Adenocarcinoma -02/21/2018: MR pelvis: Rectal adenocarcinoma: T3, N1 equivocal N2 nodes. -02/21/2018: CT CAP: Rectal wall thickening, likely representing the primary lesion.  Perirectal nodes of maximally 7 mm, suspicious for nodal metastasis.  Isolated 9 mm left lower lobe pulmonary nodule.  Right hepatic lobe hypoattenuation, including central more nodular component and peripheral more wedge-shaped component. -03/05/2018: PET scan: Hypermetabolic rectal mass consistent with known neoplasm.  Small but hypermetabolic left perirectal and sigmoid mesocolon lymph nodes.  Single hepatic metastatic lesion in the right hepatic lobe.  7 mm pulmonary nodule at the left lung base is suspicious for metastasis. -03/08/2018: MRI liver: 1.5 cm lesion in the posterior right hepatic lobe, highly concerning for metastatic disease. -03/14/2018-06/27/2018: FOLFOX + Panitumumab  (7 cycles). Panitumumab  (cycles 3-6) discontinued after 6 cycles for severe rash.  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  Persistent but significantly improved radiotracer uptake associated with rectal neoplasm. -06/25/2018: MR liver: Hepatic metastasis in the right hepatic lobe with decreased significantly in size and demonstrate no perceptible  enhancement. -07/20/2018-09/06/2018: Chemo RT to rectum/pelvis with Capecitabine  -09/27/2018: CT CAP: Stable post treatment findings of rectal neoplasm, metastatic liver lesion and presumed metastatic left lung nodule- stable. No evidence of new metastatic disease in the chest, abdomen, or pelvis. -10/17/2018: Right liver lesion microwave ablation  -12/07/2018: Low anterior resection and diverting ileostomy , Colon resection:  - Invasive adenocarcinoma, well-differentiated, scattered foci over 2.0 cm span - Adenocarcinoma extends into but not through the muscularis propria - The surgical resection margins are negative for carcinoma - No evidence of carcinoma in 0/4 lymph nodes -ypT2, ypN0. -p-MMR -12/11/2018: CT CAP: Postoperative changes from low APR with colo anal anastomosis and right lower quadrant ileostomy.Previously treated liver metastases within posterior right lobe of liver appears increased in size.  -12/2018-03/2019: CT CAP: Stable -07/10/2019: CT CAP: A left lower lobe pulmonary nodule is felt to have enlarged.  New inferior right hepatic lobe hypoattenuating lesion, most consistent with metastatic disease. -07/16/2019: MRI abdomen: Two enhancing lesions in the right hepatic lobe seen on recent CT are consistent with metastatic disease.No other evidence of metastatic disease in the abdomen.  -08/15/2019: Microwave ablation of the liver lesion -10/21/2019: CT CAP: Pulmonary nodules in the chest, largest in the LEFT lower lobe has enlarged since the prior study. Interval microwave ablation of lesions in the RIGHT hepatic lobe with NEW LEFT hepatic lobe lesion compatible with additional site of metastasis. -11/05/2019: MR liver: Isolated left hepatic metastasis, as on prior CT. Treated metastasis within the right hepatic lobe, without residual or recurrent disease. -11/15/2019-11/22/2019: SBRT to lung lesion -12/04/2019-12/13/2019: SBRT to liver lesion -01/22/2020: CT CAP: Evaluation  of a new lesion of the inferior left lobe of the liver, previously better characterized by MR, is limited by the placement of adjacent metallic fiducial marking clips, however this lesion does appear to have somewhat diminished  and decreased in density compared to prior examinations, consistent with treatment response to radiation therapy.Redemonstrated nonenhancing low-attenuation ablation sites of the inferior right lobe of the liver, hepatic segment VI and posterior liver dome, hepatic segment VII. No evidence of local recurrence at these sites. Interval enlargement of a nodule of the medial right pulmonary apex. Interval decrease in size of an irregular nodule of the dependent left lung base. -04/01/2020:CT CAP: Stable exam -07/01/2020: CT CAP: Interval enlargement of a pulmonary nodule of the medial right lung apex measuring 0.9 x 0.7 cm, previously 0.7 x 0.5 cm, highly suspicious for pulmonary metastatic disease.Interval increase in somewhat nodular consolidation of the dependent medial left lung base, measuring approximately 2.6 x 1.1 cm.  -08/06/2020-08/13/2020: SBRT to RUL lung lesion -11/06/2020: CT CAP: Stable disease -05/12/2021: CT CAP: Progression in pulmonary metastatic disease, namely in the left lower lobe. Likely associated increase in size of a subcarinal lymph node. -08/09/2021: CT chest: Enlarging LEFT lower lobe pulmonary nodule and tubular and irregular areas of nodularity and discrete nodularity at the medial aspect of the RIGHT lower lobe. Findings suspicious for worsening of disease and endobronchial dissemination of tumor. Decreasing size of RIGHT hepatic lobe metastasis.  -08/19/2021: PET scan: Multifocal left lung nodules, compatible with metastases. Radiation changes in the right lung apex. Associated subcarinal nodal metastasis. Treated metastasis in the posterior right hepatic lobe. -09/11/2021-10/05/2021: SBRT left lung lesion -01/06/2022: CT CAP:  Interval response to  therapy with decreased size of the dominant left infrahilar pulmonary nodule and subcarinal lymph node. New part solid subpleural density in the left lower lobe is likely related to interval therapy. The treated right upper lobe lesion is unchanged. Slowly enlarging solid nodule posteriorly in the left upper lobe, suspicious for a metastasis. No other suspicious nodules. The treated hepatic metastases are stable to decreased in size. No new or enlarging lesions. No evidence of progressive metastatic disease in the abdomen or pelvis. -05/26/2022: PET: Hypermetabolic LEFT upper lobe nodule along the fissures concerning for pulmonary metastasis. Interval resolution of hypermetabolic metastasis in the LEFT lower lobe, hilum, and mediastinum.There is evidence of NEW METASTATIC DISEASE to the RIGHT adrenal gland and recurrent metastatic malignancy in the LEFT hepatic lobe of the liver. New activity in this C6 vertebral body is consistent with new skeletal metastasis. - 06/09/2022: Liver, biopsy: - Metastatic moderately differentiated colonic adenocarcinoma -06/27/2022: [facility] NGS: KRAS/NRAS: Not detected, BRAF: Not detected.  MSI-stable, P-MMR, TMB: Low, 3mut/Mb.  - NTRK 1/2/3, RET, BRAF, eGFR, ERBB2, NF1, PD-L1, PIK 3 CA, POLE, PTEN: Negative -07/01/2022: MRI cervical spine: Osseous metastatic disease in the C6 vertebral body extending to the right posterior elements with extraosseous tumor along the posterior endplate and extending into the right C5-C6 and C6-C7 neural foramina, likely affecting the exiting C6 and C7 nerve roots. -07/06/2022-10/26/2022: FOLFOX + Bevacizumab .  -07/14/2022-08/03/2022: XRT to C6 vertebral lesion -08/18/2022: MRI brain:  3 mm nodular enhancing lesion within the anterior right frontal lobe white matter consistent with a metastasis. 4 mm enhancing focus within the left frontal calvarium -08/31/2022: SRS to the brain lesion -11/03/2022: PET scan: Overall improvement in most of the  metastatic areas -11/09/2022-Current: Maintenance 5FU + Bevacizumab    -01/25/2023-current: Changed to every 4 weeks infusion for tolerability and convenience. -11/2022-04/17/2023: PET scan every 3 months with no evidence of new disease -11/30/2022-Current: MRI brain every 3 months: NED -09/07/2023: PET: Decreasing low-level uptake along the rectum with surgical changes. Increased size and uptake of a nodule in the superior segment left lower lobe  medially worrisome for a developing lung metastasis. Other areas of lung nodularity with low-level uptake are stable including some ill-defined areas in the left lower lobe and right lung apex and more defined area in the left upper lobe.  -11/01/2023: MR Brain with and without Contrast: No evidence of metastatic disease to the brain. Stable small focal area of contrast enhancement within the left frontal bone  along the inner table of the skull.  -12/12/2023: CT Chest with Contrast:  13 mm nodule seen previously in the medial right apex has become incorporated into more confluent architectural distortion and consolidative opacity suggesting post radiation scarring. Patchy consolidative disease seen previously in the retro hilar left ower lobe has become more confluent in the interval with air bronchograms. This may be related to evolving post treatment scarring although superimposed infection is not excluded. 10 mm posterior left upper lobe pulmonary nodule was approximately 8 mm previously. Increasing right paratracheal lymph node. 9 mm ill-defined hypoattenuating lesion in the medial right liver was not well seen on previous PET-CT but has decreased compared to previous contrast infused CT of 05/13/2022. Stable 2 cm right adrenal nodule. Splenomegaly with similar appearance of cystic lesion anteriorly. 4.6 cm diameter ascending thoracic aorta.  -01/11/2024: PET Scan: Hypermetabolic left upper and left lower lobe pulmonary nodules again noted consistent with  metastatic disease. No substantial change in size although some lesion show mild interval increase in FDG uptake. Nodular consolidative opacity in the posterior left lower lobe shows modest hypermetabolism. This may be in part related to treatment related change. Low level FDG uptake associated with the right apical scarring. No evidence for hypermetabolic metastatic disease in the abdomen or pelvis. Stable mild hypermetabolism associated with the low rectum/anal region.  -03/05/2024: MRI brain: No evidence of metastasis.   Current Treatment:  Maintenance 5FU + Bevacizumab  every 4 weeks  INTERVAL HISTORY:   Discussed the use of AI scribe software for clinical note transcription with the patient, who gave verbal consent to proceed.  History of Present Illness Samantha Reynolds is a 59 year old female with metastatic colorectal cancer and chronic cytopenias who presents for routine oncology follow-up and management of ongoing symptoms.  Over the past three weeks, she has experienced significant improvement in overall well-being, attributing this to recent omission of Avastin  due to proteinuria and administration of intravenous fluids. She reports increased activity, participation in holiday events, and reduced fatigue. She has coordinated chemotherapy and an upcoming PET scan with a planned cruise, and is in contact with radiation oncology to initiate therapy after her return.  Cough has decreased in frequency and severity, with intermittent use of nasal sprays and discontinuation of nebulizer therapy due to lack of benefit. Nocturnal cough and sleep disturbance are absent.  She experiences alternating constipation and diarrhea following chemotherapy. She does not receive growth factor injections due to constipation, which is followed by episodes of diarrhea. She uses loperamide  as needed but has not required it recently. Stools are looser but not frankly diarrheal except for a few days  post-chemotherapy. She takes pantoprazole , which she feels contributes to softer stools. Nausea is absent with the most recent chemotherapy cycle.  Appetite remains decreased with early satiety, resulting in a four-pound weight loss over five months. She is making efforts to improve nutrition and has a nutritionist visit scheduled.  She is aware of chronic leukopenia and thrombocytopenia, with current laboratory values at the lower end of her typical range. She also has chronic hyperbilirubinemia, with recent total bilirubin at 2.5  mg/dL and prior values as high as 5.5 mg/dL.  She recently saw Dr. Debby, who recommended lubricants for bowel symptoms and advised against stretching. She does not have regular, normal stools due to her medication regimen and underlying condition.    I have reviewed the past medical history, past surgical history, social history and family history with the patient and they are unchanged from previous note.  ALLERGIES:  is allergic to oxaliplatin , demerol  [meperidine ], penicillins, levaquin  [levofloxacin ], other, and vancomycin .  MEDICATIONS:  Current Outpatient Medications  Medication Sig Dispense Refill   albuterol  (PROVENTIL ) (2.5 MG/3ML) 0.083% nebulizer solution Take 3 mLs (2.5 mg total) by nebulization every 6 (six) hours as needed for wheezing or shortness of breath. 75 mL 12   ALPRAZolam  (XANAX ) 0.5 MG tablet Take 1 tablet (0.5 mg total) by mouth 3 (three) times daily as needed for anxiety. 90 tablet 5   amitriptyline  (ELAVIL ) 10 MG tablet Take 1 tablet (10 mg total) by mouth 2 (two) times daily. 180 tablet 3   ARMOUR THYROID  60 MG tablet Take 1 tablet (60 mg total) by mouth daily. 90 tablet 2   azelastine  (ASTELIN ) 0.1 % nasal spray Place 2 sprays into both nostrils 2 (two) times daily. Use in each nostril as directed 30 mL 12   benzonatate  (TESSALON ) 100 MG capsule Take 1 capsule (100 mg total) by mouth 2 (two) times daily as needed for cough. 20 capsule 2    fluticasone  (FLONASE ) 50 MCG/ACT nasal spray Place 2 sprays into both nostrils daily. 16 g 6   loperamide  (IMODIUM ) 2 MG capsule Take 1 capsule (2 mg total) by mouth 3 (three) times daily. 30 capsule 0   Nebulizer System All-In-One MISC Use as directed. 1 each 0   pantoprazole  (PROTONIX ) 20 MG tablet Take 1 tablet (20 mg total) by mouth daily. 90 tablet 0   prochlorperazine  (COMPAZINE ) 10 MG tablet Take 1 tablet (10 mg total) by mouth every 6 (six) hours as needed for nausea or vomiting. 30 tablet 4   thyroid  (ARMOUR THYROID ) 30 MG tablet Take 1 tablet (30 mg total) by mouth daily before breakfast every Monday. 21 tablet 5   No current facility-administered medications for this visit.   Facility-Administered Medications Ordered in Other Visits  Medication Dose Route Frequency Provider Last Rate Last Admin   clindamycin  (CLEOCIN ) 900 mg in dextrose  5 % 50 mL IVPB  900 mg Intravenous 60 min Pre-Op Debby Hila, MD       And   gentamicin  (GARAMYCIN ) 350 mg in dextrose  5 % 50 mL IVPB  5 mg/kg Intravenous 60 min Pre-Op Debby Hila, MD       clindamycin  (CLEOCIN ) 900 mg in dextrose  5 % 50 mL IVPB  900 mg Intravenous 60 min Pre-Op Debby Hila, MD       And   gentamicin  (GARAMYCIN ) 5 mg/kg in dextrose  5 % 50 mL IVPB  5 mg/kg Intravenous 60 min Pre-Op Debby Hila, MD       sodium chloride  0.9 % 1,000 mL with potassium chloride  20 mEq, magnesium  sulfate 2 g infusion   Intravenous Once Katragadda, Sreedhar, MD        VITALS:  Last menstrual period 02/16/2015.  Wt Readings from Last 3 Encounters:  04/03/24 119 lb 14.9 oz (54.4 kg)  03/13/24 118 lb 2.7 oz (53.6 kg)  02/14/24 121 lb 12.8 oz (55.2 kg)    There is no height or weight on file to calculate BMI.  Performance status (ECOG): 1 -  Symptomatic but completely ambulatory  PHYSICAL EXAM:   GENERAL:alert, no distress and comfortable SKIN: skin color, texture, turgor are normal, no rashes or significant lesions LYMPH:  no  palpable lymphadenopathy in the cervical, axillary or inguinal LUNGS: Wheezing bilaterally on ascultation HEART: regular rate & rhythm and no murmurs and no lower extremity edema ABDOMEN:abdomen soft, non-tender and normal bowel sounds Musculoskeletal:no cyanosis of digits and no clubbing  NEURO: alert & oriented x 3 with fluent speech   LABORATORY DATA:  I have reviewed the data as listed   Lab Results  Component Value Date   WBC 1.9 (L) 04/03/2024   NEUTROABS 1.3 (L) 04/03/2024   HGB 10.7 (L) 04/03/2024   HCT 32.4 (L) 04/03/2024   MCV 105.2 (H) 04/03/2024   PLT 66 (L) 04/03/2024      Chemistry      Component Value Date/Time   NA 139 04/03/2024 0854   NA 140 11/03/2015 0841   K 3.5 04/03/2024 0854   CL 104 04/03/2024 0854   CO2 20 (L) 04/03/2024 0854   BUN 11 04/03/2024 0854   BUN 10 11/03/2015 0841   CREATININE 0.87 04/03/2024 0854      Component Value Date/Time   CALCIUM  8.7 (L) 04/03/2024 0854   ALKPHOS 194 (H) 04/03/2024 0854   AST 27 04/03/2024 0854   ALT 12 04/03/2024 0854   BILITOT 2.5 (H) 04/03/2024 0854   BILITOT 2.1 (H) 11/03/2015 0841      Latest Reference Range & Units 02/14/24 09:39  CEA 0.0 - 4.7 ng/mL 7.7 (H)  (H): Data is abnormally high  RADIOGRAPHIC STUDIES: I have personally reviewed the radiological images as listed and agreed with the findings in the report.  MR BRAIN W WO CONTRAST EXAM: MRI BRAIN WITH AND WITHOUT CONTRAST 03/05/2024 04:45:21 PM  TECHNIQUE: Multiplanar multisequence MRI of the head/brain was performed with and without the administration of intravenous contrast.  CONTRAST: 5.5 mL of Gadobutrol .  COMPARISON: MR Head Wo/w Cm 11/01/2023, brain MRI 06/28/23, and earlier.  CLINICAL HISTORY: 59 year old female, rectal cancer . History of a transient right frontal lobe white matter enhancing lesion last year . No evidence of metastatic disease to the brain since that time. Restaging.  FINDINGS: BRAIN AND VENTRICLES: No  acute infarct. No acute intracranial hemorrhage. No mass effect or midline shift. No hydrocephalus. The sella is unremarkable. Normal flow voids. Brain volume remains normal. Widely scattered but small bilateral frontal lobes white matter, mostly subcortical, T2 and FLAIR hyperintense foci without discernible enhancement are stable. No cortical encephalomalacia or chronic cerebral blood products identified. And otherwise normal for age gray and white matter signal. No abnormal intraaxial enhancement. No abnormal dural thickening.  ORBITS: No abnormality.  SINUSES: No abnormality. Grossly normal visible internal auditory structures.  BONES AND SOFT TISSUES: No acute soft tissue abnormality. Bone marrow signal stable since last year and within normal limits. Normal visible cervical spine.  IMPRESSION: 1. Stable MRI appearance of the brain. No metastatic disease or acute intracranial abnormality.  Electronically signed by: Helayne Hurst MD 03/06/2024 10:12 AM EST RP Workstation: [id number]      "

## 2024-04-03 ENCOUNTER — Inpatient Hospital Stay: Attending: Hematology | Admitting: Oncology

## 2024-04-03 ENCOUNTER — Inpatient Hospital Stay

## 2024-04-03 VITALS — BP 119/81 | HR 85 | Temp 97.8°F | Resp 18

## 2024-04-03 DIAGNOSIS — K219 Gastro-esophageal reflux disease without esophagitis: Secondary | ICD-10-CM | POA: Insufficient documentation

## 2024-04-03 DIAGNOSIS — Z880 Allergy status to penicillin: Secondary | ICD-10-CM | POA: Insufficient documentation

## 2024-04-03 DIAGNOSIS — Z881 Allergy status to other antibiotic agents status: Secondary | ICD-10-CM | POA: Diagnosis not present

## 2024-04-03 DIAGNOSIS — Z5112 Encounter for antineoplastic immunotherapy: Secondary | ICD-10-CM | POA: Insufficient documentation

## 2024-04-03 DIAGNOSIS — E876 Hypokalemia: Secondary | ICD-10-CM

## 2024-04-03 DIAGNOSIS — R110 Nausea: Secondary | ICD-10-CM

## 2024-04-03 DIAGNOSIS — Y842 Radiological procedure and radiotherapy as the cause of abnormal reaction of the patient, or of later complication, without mention of misadventure at the time of the procedure: Secondary | ICD-10-CM | POA: Insufficient documentation

## 2024-04-03 DIAGNOSIS — Z932 Ileostomy status: Secondary | ICD-10-CM | POA: Insufficient documentation

## 2024-04-03 DIAGNOSIS — D696 Thrombocytopenia, unspecified: Secondary | ICD-10-CM | POA: Insufficient documentation

## 2024-04-03 DIAGNOSIS — K5900 Constipation, unspecified: Secondary | ICD-10-CM | POA: Diagnosis not present

## 2024-04-03 DIAGNOSIS — C787 Secondary malignant neoplasm of liver and intrahepatic bile duct: Secondary | ICD-10-CM | POA: Diagnosis not present

## 2024-04-03 DIAGNOSIS — R112 Nausea with vomiting, unspecified: Secondary | ICD-10-CM

## 2024-04-03 DIAGNOSIS — C7931 Secondary malignant neoplasm of brain: Secondary | ICD-10-CM | POA: Insufficient documentation

## 2024-04-03 DIAGNOSIS — C20 Malignant neoplasm of rectum: Secondary | ICD-10-CM

## 2024-04-03 DIAGNOSIS — K521 Toxic gastroenteritis and colitis: Secondary | ICD-10-CM | POA: Diagnosis not present

## 2024-04-03 DIAGNOSIS — E278 Other specified disorders of adrenal gland: Secondary | ICD-10-CM | POA: Insufficient documentation

## 2024-04-03 DIAGNOSIS — C7801 Secondary malignant neoplasm of right lung: Secondary | ICD-10-CM | POA: Diagnosis not present

## 2024-04-03 DIAGNOSIS — Z5111 Encounter for antineoplastic chemotherapy: Secondary | ICD-10-CM | POA: Diagnosis present

## 2024-04-03 DIAGNOSIS — C779 Secondary and unspecified malignant neoplasm of lymph node, unspecified: Secondary | ICD-10-CM | POA: Insufficient documentation

## 2024-04-03 DIAGNOSIS — Z79890 Hormone replacement therapy (postmenopausal): Secondary | ICD-10-CM | POA: Diagnosis not present

## 2024-04-03 DIAGNOSIS — C7971 Secondary malignant neoplasm of right adrenal gland: Secondary | ICD-10-CM | POA: Insufficient documentation

## 2024-04-03 DIAGNOSIS — C7951 Secondary malignant neoplasm of bone: Secondary | ICD-10-CM | POA: Diagnosis not present

## 2024-04-03 DIAGNOSIS — T451X5A Adverse effect of antineoplastic and immunosuppressive drugs, initial encounter: Secondary | ICD-10-CM | POA: Diagnosis not present

## 2024-04-03 DIAGNOSIS — Z885 Allergy status to narcotic agent status: Secondary | ICD-10-CM | POA: Diagnosis not present

## 2024-04-03 DIAGNOSIS — D72819 Decreased white blood cell count, unspecified: Secondary | ICD-10-CM | POA: Insufficient documentation

## 2024-04-03 DIAGNOSIS — Z79899 Other long term (current) drug therapy: Secondary | ICD-10-CM | POA: Diagnosis not present

## 2024-04-03 LAB — COMPREHENSIVE METABOLIC PANEL WITH GFR
ALT: 12 U/L (ref 0–44)
AST: 27 U/L (ref 15–41)
Albumin: 4 g/dL (ref 3.5–5.0)
Alkaline Phosphatase: 194 U/L — ABNORMAL HIGH (ref 38–126)
Anion gap: 15 (ref 5–15)
BUN: 11 mg/dL (ref 6–20)
CO2: 20 mmol/L — ABNORMAL LOW (ref 22–32)
Calcium: 8.7 mg/dL — ABNORMAL LOW (ref 8.9–10.3)
Chloride: 104 mmol/L (ref 98–111)
Creatinine, Ser: 0.87 mg/dL (ref 0.44–1.00)
GFR, Estimated: >60 mL/min
Glucose, Bld: 82 mg/dL (ref 70–99)
Potassium: 3.5 mmol/L (ref 3.5–5.1)
Sodium: 139 mmol/L (ref 135–145)
Total Bilirubin: 2.5 mg/dL — ABNORMAL HIGH (ref 0.0–1.2)
Total Protein: 6.8 g/dL (ref 6.5–8.1)

## 2024-04-03 LAB — CBC WITH DIFFERENTIAL/PLATELET
Abs Immature Granulocytes: 0.01 K/uL (ref 0.00–0.07)
Basophils Absolute: 0 K/uL (ref 0.0–0.1)
Basophils Relative: 1 %
Eosinophils Absolute: 0 K/uL (ref 0.0–0.5)
Eosinophils Relative: 1 %
HCT: 32.4 % — ABNORMAL LOW (ref 36.0–46.0)
Hemoglobin: 10.7 g/dL — ABNORMAL LOW (ref 12.0–15.0)
Immature Granulocytes: 1 %
Lymphocytes Relative: 21 %
Lymphs Abs: 0.4 K/uL — ABNORMAL LOW (ref 0.7–4.0)
MCH: 34.7 pg — ABNORMAL HIGH (ref 26.0–34.0)
MCHC: 33 g/dL (ref 30.0–36.0)
MCV: 105.2 fL — ABNORMAL HIGH (ref 80.0–100.0)
Monocytes Absolute: 0.2 K/uL (ref 0.1–1.0)
Monocytes Relative: 8 %
Neutro Abs: 1.3 K/uL — ABNORMAL LOW (ref 1.7–7.7)
Neutrophils Relative %: 68 %
Platelets: 66 K/uL — ABNORMAL LOW (ref 150–400)
RBC: 3.08 MIL/uL — ABNORMAL LOW (ref 3.87–5.11)
RDW: 17.1 % — ABNORMAL HIGH (ref 11.5–15.5)
WBC: 1.9 K/uL — ABNORMAL LOW (ref 4.0–10.5)
nRBC: 0 % (ref 0.0–0.2)

## 2024-04-03 LAB — URINALYSIS, DIPSTICK ONLY
Bilirubin Urine: NEGATIVE
Glucose, UA: NEGATIVE mg/dL
Hgb urine dipstick: NEGATIVE
Ketones, ur: NEGATIVE mg/dL
Nitrite: NEGATIVE
Protein, ur: NEGATIVE mg/dL
Specific Gravity, Urine: 1.01 (ref 1.005–1.030)
pH: 5 (ref 5.0–8.0)

## 2024-04-03 LAB — MAGNESIUM: Magnesium: 2.2 mg/dL (ref 1.7–2.4)

## 2024-04-03 MED ORDER — FLUOROURACIL CHEMO INJECTION 500 MG/10ML
320.0000 mg/m2 | Freq: Once | INTRAVENOUS | Status: AC
  Administered 2024-04-03: 500 mg via INTRAVENOUS
  Filled 2024-04-03: qty 10

## 2024-04-03 MED ORDER — DEXAMETHASONE SOD PHOSPHATE PF 10 MG/ML IJ SOLN
10.0000 mg | Freq: Once | INTRAMUSCULAR | Status: AC
  Administered 2024-04-03: 10 mg via INTRAVENOUS
  Filled 2024-04-03: qty 1

## 2024-04-03 MED ORDER — SODIUM CHLORIDE 0.9 % IV SOLN
320.0000 mg/m2 | Freq: Once | INTRAVENOUS | Status: AC
  Administered 2024-04-03: 540 mg via INTRAVENOUS
  Filled 2024-04-03: qty 27

## 2024-04-03 MED ORDER — SODIUM CHLORIDE 0.9 % IV SOLN: Freq: Once | INTRAVENOUS | Status: AC

## 2024-04-03 MED ORDER — SODIUM CHLORIDE 0.9 % IV SOLN
1920.0000 mg/m2 | INTRAVENOUS | Status: DC
  Administered 2024-04-03: 3100 mg via INTRAVENOUS
  Filled 2024-04-03: qty 62

## 2024-04-03 MED ORDER — PALONOSETRON HCL INJECTION 0.25 MG/5ML
0.2500 mg | Freq: Once | INTRAVENOUS | Status: AC
  Administered 2024-04-03: 0.25 mg via INTRAVENOUS
  Filled 2024-04-03: qty 5

## 2024-04-03 MED ORDER — FAMOTIDINE IN NACL 20-0.9 MG/50ML-% IV SOLN
20.0000 mg | Freq: Once | INTRAVENOUS | Status: AC
  Administered 2024-04-03: 20 mg via INTRAVENOUS
  Filled 2024-04-03: qty 50

## 2024-04-03 MED ORDER — SODIUM CHLORIDE 0.9 % IV SOLN
5.0000 mg/kg | Freq: Once | INTRAVENOUS | Status: AC
  Administered 2024-04-03: 300 mg via INTRAVENOUS
  Filled 2024-04-03: qty 12

## 2024-04-03 NOTE — Progress Notes (Signed)
 Patient has been examined by Dr. Davonna. Vital signs and labs have been reviewed by MD - ANC (1.3), Creatinine, LFTs (total bilirubin 2.5), hemoglobin, and platelets (66) have been reviewed by M.D. - pt may proceed with treatment.  Primary RN and pharmacy notified.

## 2024-04-03 NOTE — Patient Instructions (Signed)
 [facility] [place] - A DEPT OF [place]  Discharge Instructions: Thank you for choosing [facility] to provide your oncology and hematology care.  If you have a lab appointment with the Cancer Center - please note that after April 8th, 2024, all labs will be drawn in the cancer center.  You do not have to check in or register with the main entrance as you have in the past but will complete your check-in in the cancer center.  Wear comfortable clothing and clothing appropriate for easy access to any Portacath or PICC line.   We strive to give you quality time with your provider. You may need to reschedule your appointment if you arrive late (15 or more minutes).  Arriving late affects you and other patients whose appointments are after yours.  Also, if you miss three or more appointments without notifying the office, you may be dismissed from the clinic at the providers discretion.      For prescription refill requests, have your pharmacy contact our office and allow 72 hours for refills to be completed.    Today you received the following chemotherapy and/or immunotherapy agents Avastin , Leucovorin , and Adrucil     To help prevent nausea and vomiting after your treatment, we encourage you to take your nausea medication as directed.  BELOW ARE SYMPTOMS THAT SHOULD BE REPORTED IMMEDIATELY: *FEVER GREATER THAN 100.4 F (38 C) OR HIGHER *CHILLS OR SWEATING *NAUSEA AND VOMITING THAT IS NOT CONTROLLED WITH YOUR NAUSEA MEDICATION *UNUSUAL SHORTNESS OF BREATH *UNUSUAL BRUISING OR BLEEDING *URINARY PROBLEMS (pain or burning when urinating, or frequent urination) *BOWEL PROBLEMS (unusual diarrhea, constipation, pain near the anus) TENDERNESS IN MOUTH AND THROAT WITH OR WITHOUT PRESENCE OF ULCERS (sore throat, sores in mouth, or a toothache) UNUSUAL RASH, SWELLING OR PAIN  UNUSUAL VAGINAL DISCHARGE OR ITCHING   Items with * indicate a potential emergency and  should be followed up as soon as possible or go to the Emergency Department if any problems should occur.  Please show the CHEMOTHERAPY ALERT CARD or IMMUNOTHERAPY ALERT CARD at check-in to the Emergency Department and triage nurse.  Should you have questions after your visit or need to cancel or reschedule your appointment, please contact [facility] [place] - A DEPT OF [facility] [phone]  and follow the prompts.  Office hours are 8:00 a.m. to 4:30 p.m. Monday - Friday. Please note that voicemails left after 4:00 p.m. may not be returned until the following business day.  We are closed weekends and major holidays. You have access to a nurse at all times for urgent questions. Please call the main number to the clinic [phone] and follow the prompts.  For any non-urgent questions, you may also contact your provider using MyChart. We now offer e-Visits for anyone 37 and older to request care online for non-urgent symptoms. For details visit [email or web address].   Also download the MyChart app! Go to the app store, search MyChart, open the app, select [place], and log in with your MyChart username and password.

## 2024-04-03 NOTE — Progress Notes (Signed)
 Patient tolerated injection with no complaints voiced.  Site clean and dry with no bruising or swelling noted at site.  See MAR for details. 5FU pump started for home use, pt due to return to clinic 04/05/2024 for pump stop. Patient stable during and after injection.  Vss with discharge and left in satisfactory condition with no s/s of distress noted. All follow ups as scheduled.   Cinzia Devos

## 2024-04-03 NOTE — Patient Instructions (Signed)

## 2024-04-04 ENCOUNTER — Encounter (HOSPITAL_COMMUNITY): Payer: Self-pay | Admitting: Oncology

## 2024-04-04 ENCOUNTER — Other Ambulatory Visit: Payer: Self-pay

## 2024-04-04 ENCOUNTER — Encounter: Payer: Self-pay | Admitting: Oncology

## 2024-04-04 ENCOUNTER — Other Ambulatory Visit (HOSPITAL_COMMUNITY): Payer: Self-pay

## 2024-04-04 LAB — CEA: CEA: 10.4 ng/mL — ABNORMAL HIGH (ref 0.0–4.7)

## 2024-04-05 ENCOUNTER — Inpatient Hospital Stay

## 2024-04-05 VITALS — BP 122/77 | HR 100 | Temp 97.3°F | Resp 20

## 2024-04-05 DIAGNOSIS — C20 Malignant neoplasm of rectum: Secondary | ICD-10-CM

## 2024-04-05 DIAGNOSIS — Z5112 Encounter for antineoplastic immunotherapy: Secondary | ICD-10-CM | POA: Diagnosis not present

## 2024-04-05 NOTE — Progress Notes (Addendum)
 Patient here today for pump d/c per MD orders.  She reports feeling fatigued right now that is relieved by rest.  She denies any other issues.  Her pump had infused with zero remaining.  She had stopped the pump when infusion complete.  She tolerated pump discontinue and port deaccess without incidence. She was discharged ambulatory and in stable condition to self.  She will follow up next week as scheduled.

## 2024-04-05 NOTE — Patient Instructions (Signed)
 You had your pump d/c'd today.  We will see you next week as scheduled.

## 2024-04-06 ENCOUNTER — Encounter (HOSPITAL_COMMUNITY): Payer: Self-pay | Admitting: Oncology

## 2024-04-06 ENCOUNTER — Encounter: Payer: Self-pay | Admitting: Oncology

## 2024-04-07 IMAGING — PT NM PET TUM IMG RESTAG (PS) SKULL BASE T - THIGH
7 series · 25 of 25 positions shown · non-contrast
Comparison: CT chest dated 08/09/2021. CT abdomen/pelvis dated
05/12/2021.

CLINICAL DATA: Subsequent treatment strategy for colon cancer with
bilateral pulmonary nodules.

EXAM:
NUCLEAR MEDICINE PET SKULL BASE TO THIGH
TECHNIQUE: 7.3 mCi F-18 FDG was injected intravenously. Full-ring PET imaging
was performed from the skull base to thigh after the radiotracer. CT
data was obtained and used for attenuation correction and anatomic
localization.
Fasting blood glucose: 79 mg/dl

[Series 3: ctac · axial · 3.0mm · 0.98mm/px · z∈[-900,-24]mm · 4 of 293 slices shown]
[im 1/293]
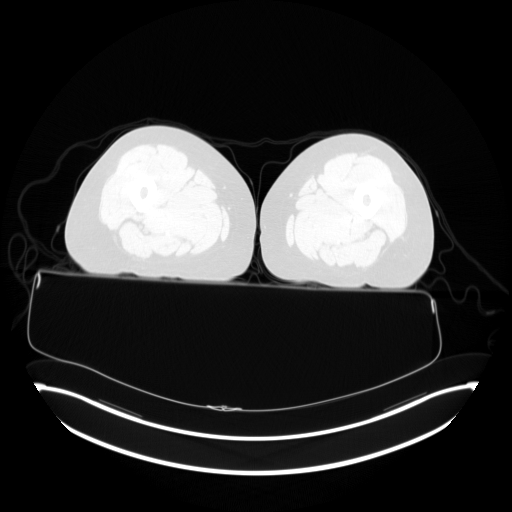
[im 98/293]
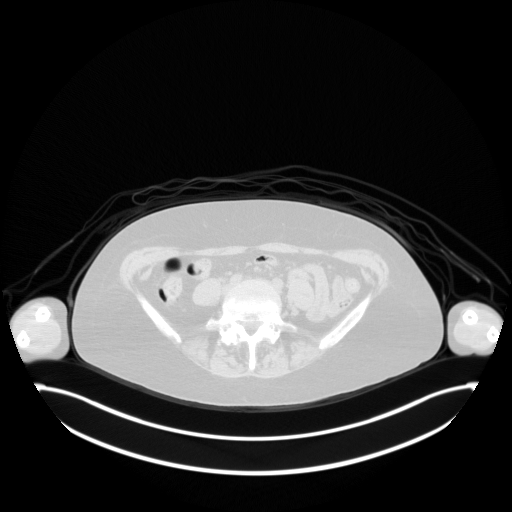
[im 195/293]
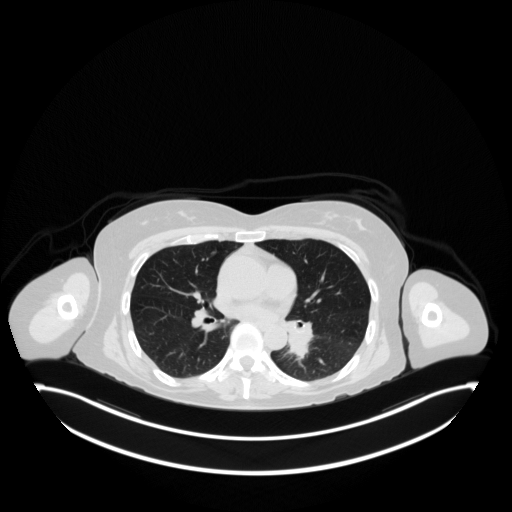
[im 293/293  brain]
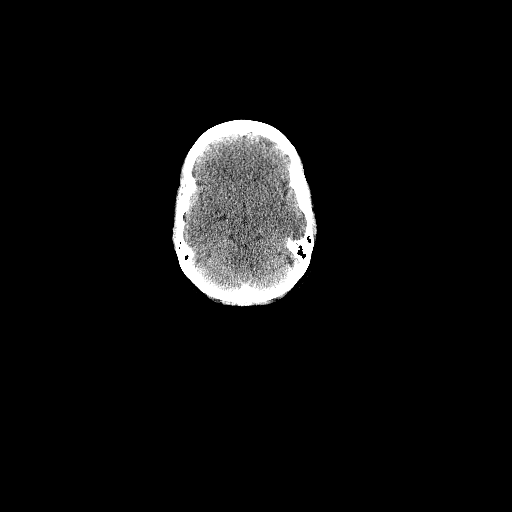

[Series 4: pet ac · axial · 3.0mm · 4.11mm/px · z∈[-900,-24]mm · 4 of 293 slices shown]
[im 1/293]
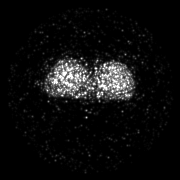
[im 98/293]
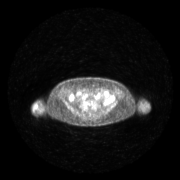
[im 195/293]
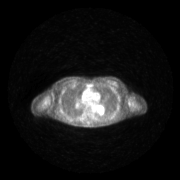
[im 293/293]
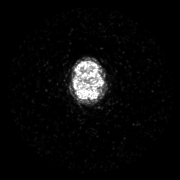

[Series 5: pet nac · axial · 3.0mm · 4.11mm/px · z∈[-900,-24]mm · 5 of 293 slices shown]
[im 1/293]
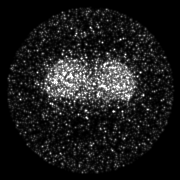
[im 74/293]
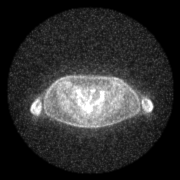
[im 147/293]
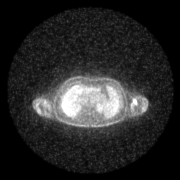
[im 220/293]
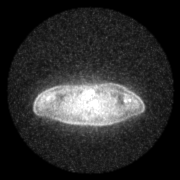
[im 293/293]
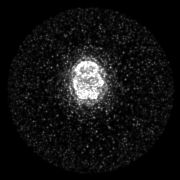

[Series 7: ct lung · axial · 3.0mm · 0.98mm/px · z∈[-484,-145]mm · 2 of 114 slices shown]
[im 1/114]
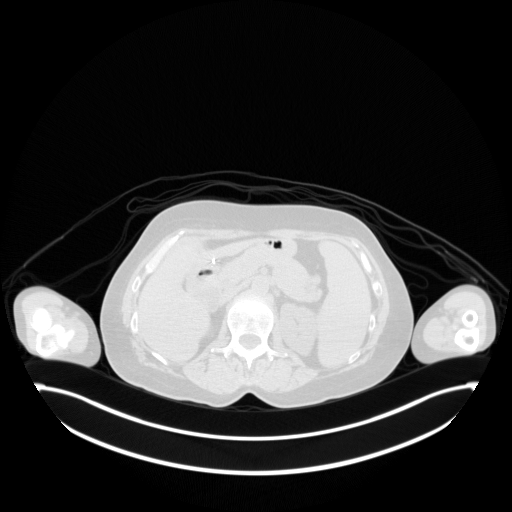
[im 114/114]
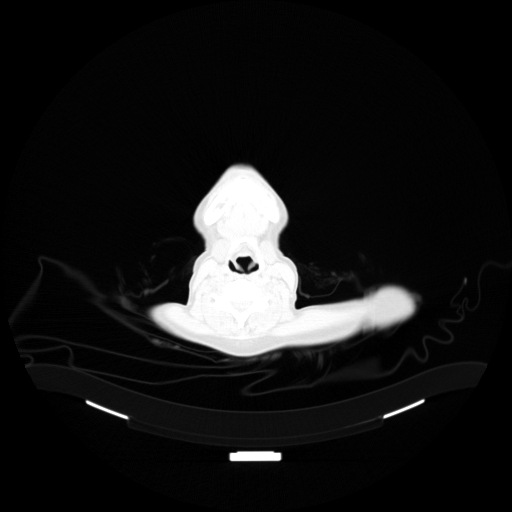

[Series 606: fused tra · 7 of 438 slices shown]
[im 1/438]
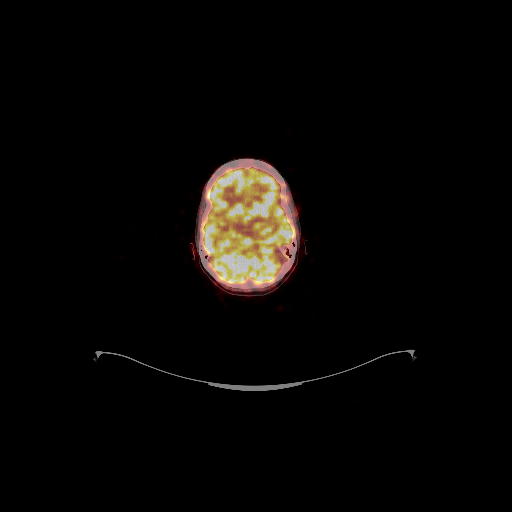
[im 73/438]
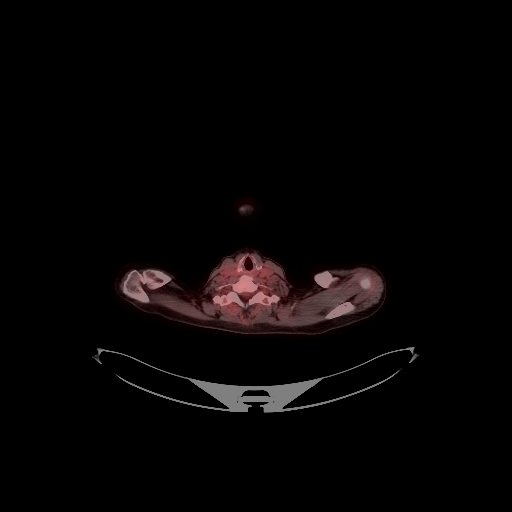
[im 146/438]
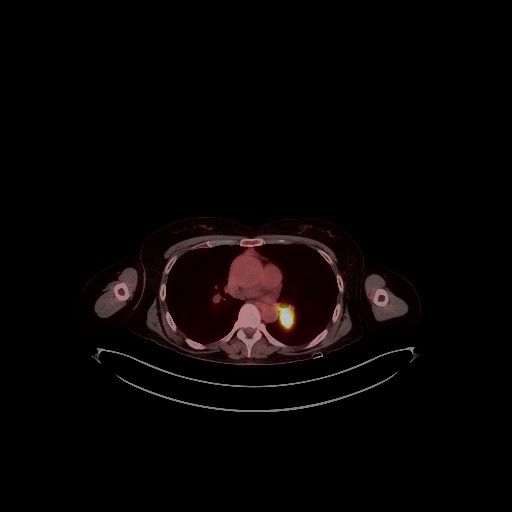
[im 219/438]
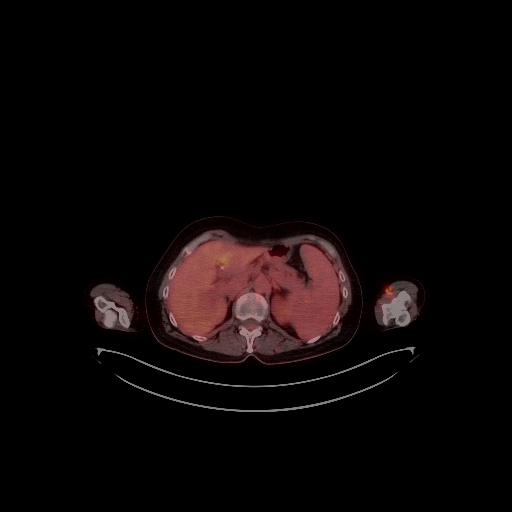
[im 292/438]
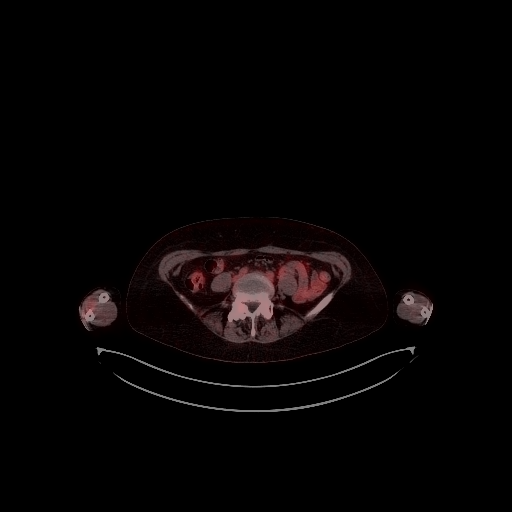
[im 365/438]
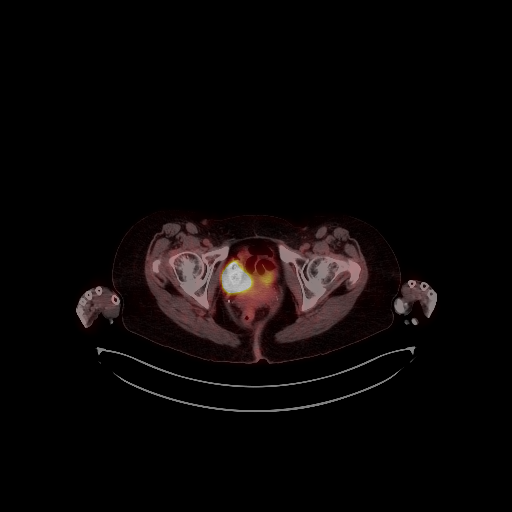
[im 438/438]
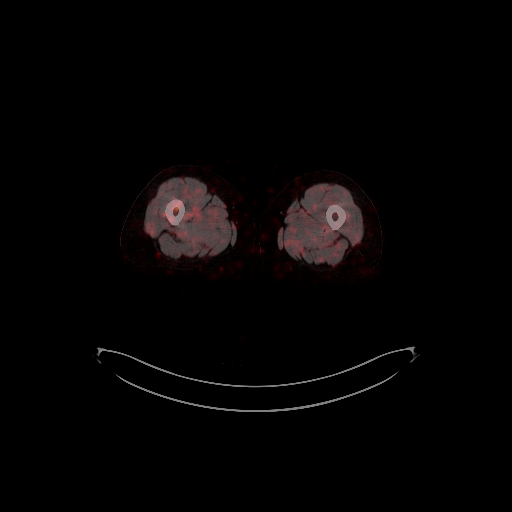

[Series 608: fused cor · 2 of 120 slices shown]
[im 1/120]
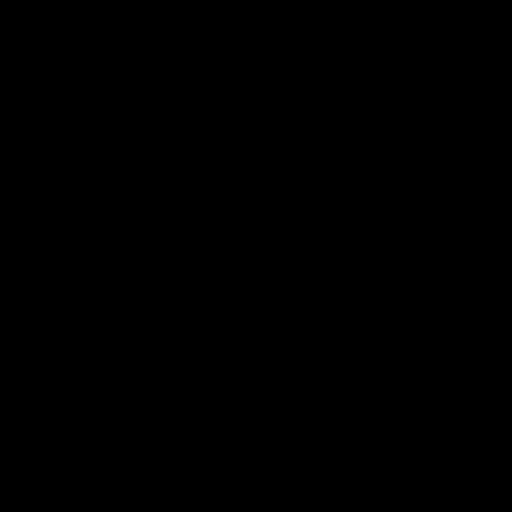
[im 120/120]
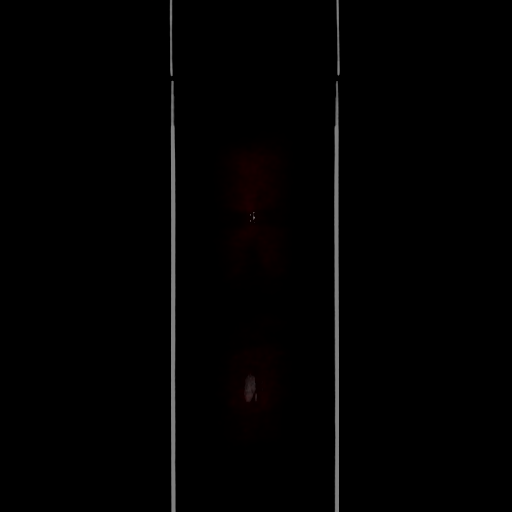

[Series 609: mip cine · coronal · 1.82mm/px · 1 of 48 slices shown]
[im 1/48]
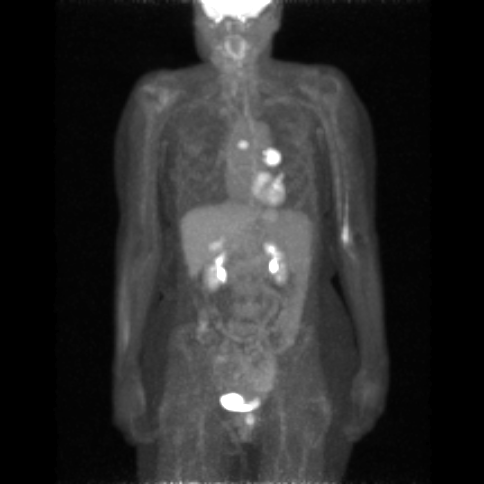

[25 of 25 positions shown; findings below may reference images not displayed]

FINDINGS: Mediastinal blood pool activity: SUV max

Liver activity: SUV max NA

NECK: No hypermetabolic cervical lymphadenopathy.

Incidental CT findings: none

CHEST: [DATE] x 2.3 cm central left lower lobe pulmonary nodule (series
7/image 87), max SUV 13.5, suspicious for metastasis (in this
clinical context) versus primary bronchogenic neoplasm.

Additional scattered left lower lobe nodules, including a 12 mm
nodule in the medial left lower lobe (series 7/image 53) and a 14 mm
nodule in the posterior left lower lobe (series 7/image 70), max SUV
6.9 and 6.3 respectively, suspicious for metastases.

7 mm nodule with surrounding radiation changes in the medial right
lung apex (series 7/image 28), max SUV 2.3. This low level
hypermetabolism is not suspicious for viable tumor.

Additional subcentimeter nodules in the left lower lobe, many of
which are beneath the size threshold for PET sensitivity.

12 mm subcarinal node (series 3/image 90), max SUV 7.7, compatible
with nodal metastasis.

Left chest port terminates in the lower SVC.

Incidental CT findings: 4.4 cm ascending thoracic aortic aneurysm,
unchanged.

ABDOMEN/PELVIS: 19 mm lesion in the posterior right hepatic lobe
(series 3/image 142) at the site of known prior hepatic metastasis,
without residual hypermetabolism. Fiducial markers along the
gallbladder fossa.

Splenomegaly. No abnormal hypermetabolism in the spleen, pancreas,
or adrenal glands.

No hypermetabolic abdominopelvic lymphadenopathy.

Prior low anterior resection with suture line in the pelvis (series
3/image 252).

Incidental CT findings: Splenic cysts measuring up to 11 mm (series
3/image 132). Atherosclerotic calcifications the abdominal aorta and
branch vessels. Prior small bowel resection with suture line in the
central abdomen (series 3/image 217).

SKELETON: No focal hypermetabolic activity to suggest skeletal
metastasis.

Incidental CT findings: none
IMPRESSION: Multifocal left lung nodules, as above, compatible with metastases
in this clinical context. Radiation changes in the right lung apex.

Associated subcarinal nodal metastasis.

Treated metastasis in the posterior right hepatic lobe.

Additional postsurgical and ancillary findings as above.

## 2024-04-08 ENCOUNTER — Telehealth: Payer: Self-pay | Admitting: Dietician

## 2024-04-08 ENCOUNTER — Inpatient Hospital Stay: Admitting: Dietician

## 2024-04-08 NOTE — Telephone Encounter (Unsigned)
 Nutrition Follow-up:  Patient with progression of rectal cancer. She is currently receiving Folfox + Bevacizumab  q14d (start 4/9). Patient planning palliative radiation to cervical spine under the care of Dr. Dewey.   Last seen by this RD 07/11/22  Feels hungry, but after a few bites she is full. Occasional nausea. Has yogurt every morning which she tolerates well. Can eat cucumbers and tomatoes if pilled and deseeded. Once or twice a month, she will go and get a filet mignon. Most of the time, she digest well. Eats chicken and dumplings at [employer], 5 cheese pasta, cheese ravioli. Can tolerate spoonfuls of green beans, fried okra, few onions.  Biggest problem is not wanting anything. Lunch is the difficult meal to get in. Drinking fairlife shakes, but fond of them. Eating candy (mini chocolate bars, life savors, caramel chews). Drinking a lot of tea, some kool-aid and occasional soda.   Medications: reviewed  Labs: reviewed  Anthropometrics: Wt 119 lb 14.9 oz  (1/7)  12/17 - 118 lb 2.7 oz 11/19 - 121 lb 12.8 oz  10/22 - 123 lb 3.2 oz    NUTRITION DIAGNOSIS: Inadequate oral intake continues   INTERVENTION: ***    MONITORING, EVALUATION, GOAL: wt trends, intake   NEXT VISIT:

## 2024-04-11 ENCOUNTER — Inpatient Hospital Stay

## 2024-04-11 ENCOUNTER — Encounter: Payer: Self-pay | Admitting: Oncology

## 2024-04-11 DIAGNOSIS — Z5112 Encounter for antineoplastic immunotherapy: Secondary | ICD-10-CM | POA: Diagnosis not present

## 2024-04-11 MED ORDER — SODIUM CHLORIDE 0.9 % IV SOLN: Freq: Once | INTRAVENOUS | Status: AC

## 2024-04-11 NOTE — Patient Instructions (Signed)
 [facility] [place] - A DEPT OF [place]. [facility]  Discharge Instructions: Thank you for choosing [facility] to provide your oncology and hematology care.  If you have a lab appointment with the Cancer Center - please note that after April 8th, 2024, all labs will be drawn in the cancer center.  You do not have to check in or register with the main entrance as you have in the past but will complete your check-in in the cancer center.  Wear comfortable clothing and clothing appropriate for easy access to any Portacath or PICC line.   We strive to give you quality time with your provider. You may need to reschedule your appointment if you arrive late (15 or more minutes).  Arriving late affects you and other patients whose appointments are after yours.  Also, if you miss three or more appointments without notifying the office, you may be dismissed from the clinic at the providers discretion.      For prescription refill requests, have your pharmacy contact our office and allow 72 hours for refills to be completed.    Today you received 1 Liter of NS.     BELOW ARE SYMPTOMS THAT SHOULD BE REPORTED IMMEDIATELY: *FEVER GREATER THAN 100.4 F (38 C) OR HIGHER *CHILLS OR SWEATING *NAUSEA AND VOMITING THAT IS NOT CONTROLLED WITH YOUR NAUSEA MEDICATION *UNUSUAL SHORTNESS OF BREATH *UNUSUAL BRUISING OR BLEEDING *URINARY PROBLEMS (pain or burning when urinating, or frequent urination) *BOWEL PROBLEMS (unusual diarrhea, constipation, pain near the anus) TENDERNESS IN MOUTH AND THROAT WITH OR WITHOUT PRESENCE OF ULCERS (sore throat, sores in mouth, or a toothache) UNUSUAL RASH, SWELLING OR PAIN  UNUSUAL VAGINAL DISCHARGE OR ITCHING   Items with * indicate a potential emergency and should be followed up as soon as possible or go to the Emergency Department if any problems should occur.  Please show the CHEMOTHERAPY ALERT CARD or IMMUNOTHERAPY ALERT CARD at check-in to  the Emergency Department and triage nurse.  Should you have questions after your visit or need to cancel or reschedule your appointment, please contact [facility] [place] - A DEPT OF [facility] [phone]  and follow the prompts.  Office hours are 8:00 a.m. to 4:30 p.m. Monday - Friday. Please note that voicemails left after 4:00 p.m. may not be returned until the following business day.  We are closed weekends and major holidays. You have access to a nurse at all times for urgent questions. Please call the main number to the clinic [phone] and follow the prompts.  For any non-urgent questions, you may also contact your provider using MyChart. We now offer e-Visits for anyone 56 and older to request care online for non-urgent symptoms. For details visit [email or web address].   Also download the MyChart app! Go to the app store, search MyChart, open the app, select [place], and log in with your MyChart username and password.

## 2024-04-11 NOTE — Progress Notes (Signed)
 Patient presents today for 1 Liter of normal saline over per Dr.Kandala. Vital signs stable and patient voiced no new complaints at this time.  Discharged from clinic ambulatory in stable condition. Alert and oriented x 3. F/U with [facility] as scheduled.

## 2024-04-16 ENCOUNTER — Other Ambulatory Visit: Payer: Self-pay

## 2024-04-25 ENCOUNTER — Other Ambulatory Visit: Payer: Self-pay | Admitting: Oncology

## 2024-04-25 ENCOUNTER — Other Ambulatory Visit: Payer: Self-pay

## 2024-04-25 ENCOUNTER — Encounter (HOSPITAL_COMMUNITY)
Admission: RE | Admit: 2024-04-25 | Discharge: 2024-04-25 | Disposition: A | Discharge status: Home / Self Care | Source: Ambulatory Visit | Attending: Oncology | Admitting: Oncology

## 2024-04-25 DIAGNOSIS — C20 Malignant neoplasm of rectum: Secondary | ICD-10-CM

## 2024-04-25 DIAGNOSIS — C787 Secondary malignant neoplasm of liver and intrahepatic bile duct: Secondary | ICD-10-CM | POA: Insufficient documentation

## 2024-04-25 MED ORDER — FLUDEOXYGLUCOSE F - 18 (FDG) INJECTION
5.8100 | Freq: Once | INTRAVENOUS | Status: AC | PRN
  Administered 2024-04-25: 5.81 via INTRAVENOUS

## 2024-04-26 ENCOUNTER — Other Ambulatory Visit: Payer: Self-pay

## 2024-05-01 ENCOUNTER — Inpatient Hospital Stay: Attending: Hematology | Admitting: Oncology

## 2024-05-01 ENCOUNTER — Inpatient Hospital Stay

## 2024-05-01 DIAGNOSIS — C20 Malignant neoplasm of rectum: Secondary | ICD-10-CM

## 2024-05-01 LAB — CBC WITH DIFFERENTIAL/PLATELET
Abs Immature Granulocytes: 0.01 10*3/uL (ref 0.00–0.07)
Basophils Absolute: 0 10*3/uL (ref 0.0–0.1)
Basophils Relative: 0 %
Eosinophils Absolute: 0 10*3/uL (ref 0.0–0.5)
Eosinophils Relative: 1 %
HCT: 33.3 % — ABNORMAL LOW (ref 36.0–46.0)
Hemoglobin: 11 g/dL — ABNORMAL LOW (ref 12.0–15.0)
Immature Granulocytes: 0 %
Lymphocytes Relative: 17 %
Lymphs Abs: 0.6 10*3/uL — ABNORMAL LOW (ref 0.7–4.0)
MCH: 33.8 pg (ref 26.0–34.0)
MCHC: 33 g/dL (ref 30.0–36.0)
MCV: 102.5 fL — ABNORMAL HIGH (ref 80.0–100.0)
Monocytes Absolute: 0.2 10*3/uL (ref 0.1–1.0)
Monocytes Relative: 8 %
Neutro Abs: 2.4 10*3/uL (ref 1.7–7.7)
Neutrophils Relative %: 74 %
Platelets: 78 10*3/uL — ABNORMAL LOW (ref 150–400)
RBC: 3.25 MIL/uL — ABNORMAL LOW (ref 3.87–5.11)
RDW: 15.6 % — ABNORMAL HIGH (ref 11.5–15.5)
WBC: 3.2 10*3/uL — ABNORMAL LOW (ref 4.0–10.5)
nRBC: 0 % (ref 0.0–0.2)

## 2024-05-01 LAB — COMPREHENSIVE METABOLIC PANEL WITH GFR
ALT: 7 U/L (ref 0–44)
AST: 24 U/L (ref 15–41)
Albumin: 3.9 g/dL (ref 3.5–5.0)
Alkaline Phosphatase: 169 U/L — ABNORMAL HIGH (ref 38–126)
Anion gap: 14 (ref 5–15)
BUN: 12 mg/dL (ref 6–20)
CO2: 24 mmol/L (ref 22–32)
Calcium: 8.7 mg/dL — ABNORMAL LOW (ref 8.9–10.3)
Chloride: 104 mmol/L (ref 98–111)
Creatinine, Ser: 0.91 mg/dL (ref 0.44–1.00)
GFR, Estimated: >60 mL/min
Glucose, Bld: 92 mg/dL (ref 70–99)
Potassium: 3.7 mmol/L (ref 3.5–5.1)
Sodium: 141 mmol/L (ref 135–145)
Total Bilirubin: 2.7 mg/dL — ABNORMAL HIGH (ref 0.0–1.2)
Total Protein: 6.7 g/dL (ref 6.5–8.1)

## 2024-05-01 LAB — MAGNESIUM: Magnesium: 2.2 mg/dL (ref 1.7–2.4)

## 2024-05-01 MED ORDER — SODIUM CHLORIDE 0.9 % IV SOLN
5.0000 mg/kg | Freq: Once | INTRAVENOUS | Status: AC
  Administered 2024-05-01: 300 mg via INTRAVENOUS
  Filled 2024-05-01: qty 12

## 2024-05-01 MED ORDER — FLUOROURACIL CHEMO INJECTION 500 MG/10ML
320.0000 mg/m2 | Freq: Once | INTRAVENOUS | Status: AC
  Administered 2024-05-01: 500 mg via INTRAVENOUS
  Filled 2024-05-01: qty 10

## 2024-05-01 MED ORDER — SODIUM CHLORIDE 0.9 % IV SOLN
320.0000 mg/m2 | Freq: Once | INTRAVENOUS | Status: AC
  Administered 2024-05-01: 540 mg via INTRAVENOUS
  Filled 2024-05-01: qty 27

## 2024-05-01 MED ORDER — DEXAMETHASONE SOD PHOSPHATE PF 10 MG/ML IJ SOLN
10.0000 mg | Freq: Once | INTRAMUSCULAR | Status: AC
  Administered 2024-05-01: 10 mg via INTRAVENOUS
  Filled 2024-05-01: qty 1

## 2024-05-01 MED ORDER — PALONOSETRON HCL INJECTION 0.25 MG/5ML
0.2500 mg | Freq: Once | INTRAVENOUS | Status: AC
  Administered 2024-05-01: 0.25 mg via INTRAVENOUS
  Filled 2024-05-01: qty 5

## 2024-05-01 MED ORDER — SODIUM CHLORIDE 0.9 % IV SOLN
1920.0000 mg/m2 | INTRAVENOUS | Status: DC
  Administered 2024-05-01: 3100 mg via INTRAVENOUS
  Filled 2024-05-01: qty 62

## 2024-05-01 MED ORDER — SODIUM CHLORIDE 0.9 % IV SOLN: Freq: Once | INTRAVENOUS | Status: AC

## 2024-05-01 MED ORDER — FAMOTIDINE IN NACL 20-0.9 MG/50ML-% IV SOLN
20.0000 mg | Freq: Once | INTRAVENOUS | Status: AC
  Administered 2024-05-01: 20 mg via INTRAVENOUS
  Filled 2024-05-01: qty 50

## 2024-05-01 NOTE — Progress Notes (Signed)
 No urine protein needed today - last in January 2026 and negative.  Niels Molt, PharmD Clinical Pharmacist

## 2024-05-01 NOTE — Progress Notes (Unsigned)
 Patient has been examined by Dr. Davonna. Vital signs (HR 108) and labs have been reviewed by MD - ANC, Creatinine, LFTs, hemoglobin, and platelets (78) have been reviewed by M.D. - pt may proceed with treatment.  Primary RN and pharmacy notified.

## 2024-05-01 NOTE — Patient Instructions (Signed)
 [facility] [place] - A DEPT OF [place]. [facility]  Discharge Instructions: Thank you for choosing [facility] to provide your oncology and hematology care.  If you have a lab appointment with the Cancer Center - please note that after April 8th, 2024, all labs will be drawn in the cancer center.  You do not have to check in or register with the main entrance as you have in the past but will complete your check-in in the cancer center.  Wear comfortable clothing and clothing appropriate for easy access to any Portacath or PICC line.   We strive to give you quality time with your provider. You may need to reschedule your appointment if you arrive late (15 or more minutes).  Arriving late affects you and other patients whose appointments are after yours.  Also, if you miss three or more appointments without notifying the office, you may be dismissed from the clinic at the providers discretion.      For prescription refill requests, have your pharmacy contact our office and allow 72 hours for refills to be completed.    Today you received the following chemotherapy and/or immunotherapy agents Leucovorin /MVASI /5FU      To help prevent nausea and vomiting after your treatment, we encourage you to take your nausea medication as directed.  BELOW ARE SYMPTOMS THAT SHOULD BE REPORTED IMMEDIATELY: *FEVER GREATER THAN 100.4 F (38 C) OR HIGHER *CHILLS OR SWEATING *NAUSEA AND VOMITING THAT IS NOT CONTROLLED WITH YOUR NAUSEA MEDICATION *UNUSUAL SHORTNESS OF BREATH *UNUSUAL BRUISING OR BLEEDING *URINARY PROBLEMS (pain or burning when urinating, or frequent urination) *BOWEL PROBLEMS (unusual diarrhea, constipation, pain near the anus) TENDERNESS IN MOUTH AND THROAT WITH OR WITHOUT PRESENCE OF ULCERS (sore throat, sores in mouth, or a toothache) UNUSUAL RASH, SWELLING OR PAIN  UNUSUAL VAGINAL DISCHARGE OR ITCHING   Items with * indicate a potential emergency and should be  followed up as soon as possible or go to the Emergency Department if any problems should occur.  Please show the CHEMOTHERAPY ALERT CARD or IMMUNOTHERAPY ALERT CARD at check-in to the Emergency Department and triage nurse.  Should you have questions after your visit or need to cancel or reschedule your appointment, please contact [facility] [place] - A DEPT [facility] [phone]  and follow the prompts.  Office hours are 8:00 a.m. to 4:30 p.m. Monday - Friday. Please note that voicemails left after 4:00 p.m. may not be returned until the following business day.  We are closed weekends and major holidays. You have access to a nurse at all times for urgent questions. Please call the main number to the clinic [phone] and follow the prompts.  For any non-urgent questions, you may also contact your provider using MyChart. We now offer e-Visits for anyone 37 and older to request care online for non-urgent symptoms. For details visit [email or web address].   Also download the MyChart app! Go to the app store, search MyChart, open the app, select [place], and log in with your MyChart username and password.

## 2024-05-01 NOTE — Progress Notes (Signed)
 Patient presents today for MVASI /Leucovorin /Fluorouracil  with 5FU pump start per providers order.  Vital signs and labs reviewed by MD.  Per Dr. Davonna okay to proceed with treatment with elevated HR.  Treatment given today per MD orders.  Stable during infusion without adverse affects.  Vital signs stable.  5FU connected and verified RUN on the screen with the patient.  No complaints at this time.  Discharge from clinic ambulatory in stable condition.  Alert and oriented X 3.  Follow up with [facility] as scheduled.

## 2024-05-01 NOTE — Patient Instructions (Addendum)
 [facility] at [facility] Discharge Instructions   You were seen and examined today by Dr. Davonna.  She reviewed the results of your lab work which are normal/stable.   She reviewed the results of your PET scan. The lung nodules have grown minimally. We will proceed with the plan to treat these areas with radiation therapy.   We will proceed with your treatment today.   Return as scheduled.    Thank you for choosing  Cancer Center at [facility] to provide your oncology and hematology care.  To afford each patient quality time with our provider, please arrive at least 15 minutes before your scheduled appointment time.   If you have a lab appointment with the Cancer Center please come in thru the Main Entrance and check in at the main information desk.  You need to re-schedule your appointment should you arrive 10 or more minutes late.  We strive to give you quality time with our providers, and arriving late affects you and other patients whose appointments are after yours.  Also, if you no show three or more times for appointments you may be dismissed from the clinic at the providers discretion.     Again, thank you for choosing [facility].  Our hope is that these requests will decrease the amount of time that you wait before being seen by our physicians.       _____________________________________________________________  Should you have questions after your visit to [facility], please contact our office at [phone] and follow the prompts.  Our office hours are 8:00 a.m. and 4:30 p.m. Monday - Friday.  Please note that voicemails left after 4:00 p.m. may not be returned until the following business day.  We are closed weekends and major holidays.  You do have access to a nurse 24-7, just call the main number to the clinic [phone] and do not press any options, hold on the line and a nurse will answer the phone.     For prescription refill requests, have your pharmacy contact our office and allow 72 hours.    Due to Covid, you will need to wear a mask upon entering the hospital. If you do not have a mask, a mask will be given to you at the Main Entrance upon arrival. For doctor visits, patients may have 1 support person age 27 or older with them. For treatment visits, patients can not have anyone with them due to social distancing guidelines and our immunocompromised population.

## 2024-05-01 NOTE — Progress Notes (Unsigned)
 " Patient Care Team: Alphonsa Glendia LABOR, MD as PCP - General (Family Medicine)  Clinic Day:  04/03/2024  Referring physician: Alphonsa Glendia LABOR, MD   CHIEF COMPLAINT:  CC: Metastatic rectal carcinoma  ASSESSMENT & PLAN:   Assessment & Plan: Samantha Reynolds  is a 59 y.o. 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  Initially diagnosed in 2019.  Extensive oncology history below NGS: KRAS/NRAS: Wild-type, BRAF: Negative Patient had chemotherapy with FOLFOX and panitumumab .  S/p APR with aggravating ileostomy. Multiple SBRT to liver, lung and C6 vertebral lesion Also had brain metastasis for which she underwent SRS Recent CEA uptrending.  Increasing lung nodule on PET scan.  - SBRT scheduled for *** -We reviewed the PET scan findings together. Mild progression of pulmonary metastasis. - Uptrending CEA likely consistent with enlarging lung nodule. - Will continue MRI brain every 3 months. Due 05/2024. -Labs reviewed today: CMP: Normal creatinine, alkaline phosphatase elevated at 194, potassium: 3.5, total bilirubin: 2.5.  CBC: WBC: 1.9, hemoglobin: 10.7, platelets: 66. Latest CEA: 10.4-slightly elevated from prior. - Physical exam stable today.  Patient does not report any complaints today.  Will proceed with chemotherapy today. -Continue 5-FU and Avastin  every 4 weeks. (Dose reduced per patient's wishes).  Patient would like to change the schedule to align with her cruise.  Will make the necessary changes as requested by the patient.   Return to clinic in 4 weeks for follow-up with PET scan  Chemotherapy induced nausea  Resolved  Chemotherapy-induced diarrhea Most often occurrence during the first week after chemotherapy.  Patient is mostly homebound during this period.   - Continue Imodium  as prescribed.  Gastroesophageal reflux disease, unspecified whether esophagitis  present Chronic cough and hoarseness potentially related to GERD.  Pepcid  discontinued due to side effects.  20 mg pantoprazole  has been effective so far.  - Continue pantoprazole  20 mg for GERD. - Evaluated by ENT and was started on Flonase .  Thrombocytopenia Platelet count stable at 66,000. - Continue current management.   Wheezing and cough Likely due to post-nasal drip and weather changes. Nasal sprays provided some relief. More in the PM. Cough improved.   Chronic hyperbilirubinemia Chronic elevation of total bilirubin, stable and asymptomatic. Treatment parameters adjusted.  Cancer-related weight loss and anorexia Four-pound weight loss over five months, not clinically significant. Early satiety and decreased appetite, working on nutritional intake. - Scheduled nutritionist consultation for dietary counseling.   The patient understands the plans discussed today and is in agreement with them.  She knows to contact our office if she develops concerns prior to her next appointment.  The total time spent in the appointment was 23 minutes for the encounter with patient, including review of chart and various tests results, discussions about plan of care and coordination of care plan    [name], MD  [facility] [place] - A DEPT OF [facility] [address] [place] [address] Dept: [phone] Dept Fax: [phone]   No orders of the defined types were placed in this encounter.    ONCOLOGY HISTORY:   I have reviewed her chart and materials related to her cancer extensively and collaborated history with the patient. Summary of oncologic history is as follows:   Stage IV rectal adenocarcinoma with liver metastasis:  -Presentation with intermittent rectal bleeding and weight loss of 20lbs in 3 months -02/19/2018: Flexible  sigmoidoscopy: Rectal mass 2 to 4 cm from anal verge. The mass was  noncircumferential and located predominantly at the posterior bowel wall.   -02/19/2018:Rectum biopsy: Adenocarcinoma -02/21/2018: MR pelvis: Rectal adenocarcinoma: T3, N1 equivocal N2 nodes. -02/21/2018: CT CAP: Rectal wall thickening, likely representing the primary lesion.  Perirectal nodes of maximally 7 mm, suspicious for nodal metastasis.  Isolated 9 mm left lower lobe pulmonary nodule.  Right hepatic lobe hypoattenuation, including central more nodular component and peripheral more wedge-shaped component. -03/05/2018: PET scan: Hypermetabolic rectal mass consistent with known neoplasm.  Small but hypermetabolic left perirectal and sigmoid mesocolon lymph nodes.  Single hepatic metastatic lesion in the right hepatic lobe.  7 mm pulmonary nodule at the left lung base is suspicious for metastasis. -03/08/2018: MRI liver: 1.5 cm lesion in the posterior right hepatic lobe, highly concerning for metastatic disease. -03/14/2018-06/27/2018: FOLFOX + Panitumumab  (7 cycles). Panitumumab  (cycles 3-6) discontinued after 6 cycles for severe rash.  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  Persistent but significantly improved radiotracer uptake associated with rectal neoplasm. -06/25/2018: MR liver: Hepatic metastasis in the right hepatic lobe with decreased significantly in size and demonstrate no perceptible enhancement. -07/20/2018-09/06/2018: Chemo RT to rectum/pelvis with Capecitabine  -09/27/2018: CT CAP: Stable post treatment findings of rectal neoplasm, metastatic liver lesion and presumed metastatic left lung nodule- stable. No evidence of new metastatic disease in the  chest, abdomen, or pelvis. -10/17/2018: Right liver lesion microwave ablation  -12/07/2018: Low anterior resection and diverting ileostomy , Colon resection:  - Invasive adenocarcinoma, well-differentiated, scattered foci over 2.0 cm span - Adenocarcinoma extends into but not through the muscularis propria - The surgical resection margins are negative for carcinoma - No evidence of carcinoma in 0/4 lymph nodes -ypT2, ypN0. -p-MMR -12/11/2018: CT CAP: Postoperative changes from low APR with colo anal anastomosis and right lower quadrant ileostomy.Previously treated liver metastases within posterior right lobe of liver appears increased in size.  -12/2018-03/2019: CT CAP: Stable -07/10/2019: CT CAP: A left lower lobe pulmonary nodule is felt to have enlarged.  New inferior right hepatic lobe hypoattenuating lesion, most consistent with metastatic disease. -07/16/2019: MRI abdomen: Two enhancing lesions in the right hepatic lobe seen on recent CT are consistent with metastatic disease.No other evidence of metastatic disease in the abdomen.  -08/15/2019: Microwave ablation of the liver lesion -10/21/2019: CT CAP: Pulmonary nodules in the chest, largest in the LEFT lower lobe has enlarged since the prior study. Interval microwave ablation of lesions in the RIGHT hepatic lobe with NEW LEFT hepatic lobe lesion compatible with additional site of metastasis. -11/05/2019: MR liver: Isolated left hepatic metastasis, as on prior CT. Treated metastasis within the right hepatic lobe, without residual or recurrent disease. -11/15/2019-11/22/2019: SBRT to lung lesion -12/04/2019-12/13/2019: SBRT to liver lesion -01/22/2020: CT CAP: Evaluation of a new lesion of the inferior left lobe of the liver, previously better characterized by MR, is limited by the placement of adjacent metallic fiducial marking clips, however this lesion does appear to have somewhat diminished and decreased in density compared to prior  examinations, consistent with treatment response to radiation therapy.Redemonstrated nonenhancing low-attenuation ablation sites of the inferior right lobe of the liver, hepatic segment VI and posterior liver dome, hepatic segment VII. No evidence of local recurrence at  these sites. Interval enlargement of a nodule of the medial right pulmonary apex. Interval decrease in size of an irregular nodule of the dependent left lung base. -04/01/2020:CT CAP: Stable exam -07/01/2020: CT CAP: Interval enlargement of a pulmonary nodule of the medial right lung apex measuring 0.9 x 0.7 cm, previously 0.7 x 0.5 cm, highly suspicious for pulmonary metastatic disease.Interval increase in somewhat nodular consolidation of the dependent medial left lung base, measuring approximately 2.6 x 1.1 cm.  -08/06/2020-08/13/2020: SBRT to RUL lung lesion -11/06/2020: CT CAP: Stable disease -05/12/2021: CT CAP: Progression in pulmonary metastatic disease, namely in the left lower lobe. Likely associated increase in size of a subcarinal lymph node. -08/09/2021: CT chest: Enlarging LEFT lower lobe pulmonary nodule and tubular and irregular areas of nodularity and discrete nodularity at the medial aspect of the RIGHT lower lobe. Findings suspicious for worsening of disease and endobronchial dissemination of tumor. Decreasing size of RIGHT hepatic lobe metastasis.  -08/19/2021: PET scan: Multifocal left lung nodules, compatible with metastases. Radiation changes in the right lung apex. Associated subcarinal nodal metastasis. Treated metastasis in the posterior right hepatic lobe. -09/11/2021-10/05/2021: SBRT left lung lesion -01/06/2022: CT CAP:  Interval response to therapy with decreased size of the dominant left infrahilar pulmonary nodule and subcarinal lymph node. New part solid subpleural density in the left lower lobe is likely related to interval therapy. The treated right upper lobe lesion is unchanged. Slowly enlarging solid  nodule posteriorly in the left upper lobe, suspicious for a metastasis. No other suspicious nodules. The treated hepatic metastases are stable to decreased in size. No new or enlarging lesions. No evidence of progressive metastatic disease in the abdomen or pelvis. -05/26/2022: PET: Hypermetabolic LEFT upper lobe nodule along the fissures concerning for pulmonary metastasis. Interval resolution of hypermetabolic metastasis in the LEFT lower lobe, hilum, and mediastinum.There is evidence of NEW METASTATIC DISEASE to the RIGHT adrenal gland and recurrent metastatic malignancy in the LEFT hepatic lobe of the liver. New activity in this C6 vertebral body is consistent with new skeletal metastasis. - 06/09/2022: Liver, biopsy: - Metastatic moderately differentiated colonic adenocarcinoma -06/27/2022: [facility] NGS: KRAS/NRAS: Not detected, BRAF: Not detected.  MSI-stable, P-MMR, TMB: Low, 3mut/Mb.  - NTRK 1/2/3, RET, BRAF, eGFR, ERBB2, NF1, PD-L1, PIK 3 CA, POLE, PTEN: Negative -07/01/2022: MRI cervical spine: Osseous metastatic disease in the C6 vertebral body extending to the right posterior elements with extraosseous tumor along the posterior endplate and extending into the right C5-C6 and C6-C7 neural foramina, likely affecting the exiting C6 and C7 nerve roots. -07/06/2022-10/26/2022: FOLFOX + Bevacizumab .  -07/14/2022-08/03/2022: XRT to C6 vertebral lesion -08/18/2022: MRI brain:  3 mm nodular enhancing lesion within the anterior right frontal lobe white matter consistent with a metastasis. 4 mm enhancing focus within the left frontal calvarium -08/31/2022: SRS to the brain lesion -11/03/2022: PET scan: Overall improvement in most of the metastatic areas -11/09/2022-Current: Maintenance 5FU + Bevacizumab    -01/25/2023-current: Changed to every 4 weeks infusion for tolerability and convenience. -11/2022-04/17/2023: PET scan every 3 months with no evidence of new disease -11/30/2022-Current: MRI brain  every 3 months: NED -09/07/2023: PET: Decreasing low-level uptake along the rectum with surgical changes. Increased size and uptake of a nodule in the superior segment left lower lobe medially worrisome for a developing lung metastasis. Other areas of lung nodularity with low-level uptake are stable including some ill-defined areas in the left lower lobe and right lung apex and more defined area in the left upper lobe.  -11/01/2023: MR Brain  with and without Contrast: No evidence of metastatic disease to the brain. Stable small focal area of contrast enhancement within the left frontal bone  along the inner table of the skull.  -12/12/2023: CT Chest with Contrast:  13 mm nodule seen previously in the medial right apex has become incorporated into more confluent architectural distortion and consolidative opacity suggesting post radiation scarring. Patchy consolidative disease seen previously in the retro hilar left ower lobe has become more confluent in the interval with air bronchograms. This may be related to evolving post treatment scarring although superimposed infection is not excluded. 10 mm posterior left upper lobe pulmonary nodule was approximately 8 mm previously. Increasing right paratracheal lymph node. 9 mm ill-defined hypoattenuating lesion in the medial right liver was not well seen on previous PET-CT but has decreased compared to previous contrast infused CT of 05/13/2022. Stable 2 cm right adrenal nodule. Splenomegaly with similar appearance of cystic lesion anteriorly. 4.6 cm diameter ascending thoracic aorta.  -01/11/2024: PET Scan: Hypermetabolic left upper and left lower lobe pulmonary nodules again noted consistent with metastatic disease. No substantial change in size although some lesion show mild interval increase in FDG uptake. Nodular consolidative opacity in the posterior left lower lobe shows modest hypermetabolism. This may be in part related to treatment related change. Low level  FDG uptake associated with the right apical scarring. No evidence for hypermetabolic metastatic disease in the abdomen or pelvis. Stable mild hypermetabolism associated with the low rectum/anal region.  -03/05/2024: MRI brain: No evidence of metastasis. -04/25/2024: PET scan: Mild progression of pulmonary metastasis. Increase in mild central lateral segment left liver lobe hypermetabolism, without well-defined CT correlate. Cannot exclude isolated metastasis. This could be re-evaluated at follow-up or more entirely characterized with pre and post contrast abdominal MRI. Similar nonspecific anorectal hypermetabolism and ill-defined fluid within the perirectal fat. New hypermetabolism within the terminal ileum and ileocecal junction, without CT correlate.   Current Treatment:  Maintenance 5FU + Bevacizumab  every 4 weeks  INTERVAL HISTORY:   Discussed the use of AI scribe software for clinical note transcription with the patient, who gave verbal consent to proceed.  History of Present Illness Samantha Reynolds is a 59 year old female with metastatic rectal adenocarcinoma who presents for oncology follow-up to review recent imaging and coordinate chemotherapy and radiation scheduling.  A PET scan is pending. She is actively coordinating with her care team to clarify the timing of chemotherapy and radiation. She is concerned about a potential two-month gap in systemic therapy and prefers to minimize the interval without therapy to no more than five weeks.  She inquired about additional findings on her recent scan and was informed of a liver lesion without CT correlate and some fluid in the anorectal area, likely inflammatory, but no mass was identified. The lung lesions remain the most significant finding.  She experiences mild, intermittent nausea for a few days post-chemotherapy, which is not persistent. She recently restarted bevacizumab  and is uncertain whether her improvement was due to the  medication or intravenous fluids, but prefers to continue receiving fluids after chemotherapy for improved recovery and energy. She also experiences transient worsening of reflux for two to three days after chemotherapy, managed with Gaviscon at bedtime.  She notes improvement in hoarseness. She continues to have a persistent cough described as a tickle that is difficult to stop once initiated, sometimes triggered by deep breaths and exhalation. Nebulizer use has not provided significant benefit. Wheezing is not prominent and does not appear directly related to  the cough. She denies abnormal bleeding, bruising, or new palpable masses.  Apr 03, 2024: Follow-up for metastatic rectal adenocarcinoma with progression noted on PET scan (increasing lung nodule, uptrending CEA). Discussed further SBRT post-cruise and requested chemotherapy break due to fatigue. Labs showed thrombocytopenia, leukopenia, elevated alkaline phosphatase and bilirubin; physical exam stable. Chemotherapy-induced diarrhea managed with Imodium , GERD controlled with pantoprazole , and improved well-being after omitting Avastin  due to proteinuria. Treatment schedule coordinated around upcoming cruise; next PET scan scheduled for April 25, 2024.    I have reviewed the past medical history, past surgical history, social history and family history with the patient and they are unchanged from previous note.  ALLERGIES:  is allergic to oxaliplatin , demerol  [meperidine ], penicillins, levaquin  [levofloxacin ], other, and vancomycin .  MEDICATIONS:  Current Outpatient Medications  Medication Sig Dispense Refill   albuterol  (PROVENTIL ) (2.5 MG/3ML) 0.083% nebulizer solution Take 3 mLs (2.5 mg total) by nebulization every 6 (six) hours as needed for wheezing or shortness of breath. 75 mL 12   ALPRAZolam  (XANAX ) 0.5 MG tablet Take 1 tablet (0.5 mg total) by mouth 3 (three) times daily as needed for anxiety. 90 tablet 5   amitriptyline  (ELAVIL ) 10  MG tablet Take 1 tablet (10 mg total) by mouth 2 (two) times daily. 180 tablet 3   ARMOUR THYROID  60 MG tablet Take 1 tablet (60 mg total) by mouth daily. 90 tablet 2   azelastine  (ASTELIN ) 0.1 % nasal spray Place 2 sprays into both nostrils 2 (two) times daily. Use in each nostril as directed 30 mL 12   benzonatate  (TESSALON ) 100 MG capsule Take 1 capsule (100 mg total) by mouth 2 (two) times daily as needed for cough. 20 capsule 2   fluticasone  (FLONASE ) 50 MCG/ACT nasal spray Place 2 sprays into both nostrils daily. 16 g 6   loperamide  (IMODIUM ) 2 MG capsule Take 1 capsule (2 mg total) by mouth 3 (three) times daily. 30 capsule 0   Nebulizer System All-In-One MISC Use as directed. 1 each 0   pantoprazole  (PROTONIX ) 20 MG tablet Take 1 tablet (20 mg total) by mouth daily. 90 tablet 0   prochlorperazine  (COMPAZINE ) 10 MG tablet Take 1 tablet (10 mg total) by mouth every 6 (six) hours as needed for nausea or vomiting. 30 tablet 4   thyroid  (ARMOUR THYROID ) 30 MG tablet Take 1 tablet (30 mg total) by mouth daily before breakfast every Monday. 21 tablet 5   No current facility-administered medications for this visit.   Facility-Administered Medications Ordered in Other Visits  Medication Dose Route Frequency Provider Last Rate Last Admin   clindamycin  (CLEOCIN ) 900 mg in dextrose  5 % 50 mL IVPB  900 mg Intravenous 60 min Pre-Op Debby Hila, MD       And   gentamicin  (GARAMYCIN ) 350 mg in dextrose  5 % 50 mL IVPB  5 mg/kg Intravenous 60 min Pre-Op Debby Hila, MD       clindamycin  (CLEOCIN ) 900 mg in dextrose  5 % 50 mL IVPB  900 mg Intravenous 60 min Pre-Op Debby Hila, MD       And   gentamicin  (GARAMYCIN ) 5 mg/kg in dextrose  5 % 50 mL IVPB  5 mg/kg Intravenous 60 min Pre-Op Debby Hila, MD       sodium chloride  0.9 % 1,000 mL with potassium chloride  20 mEq, magnesium  sulfate 2 g infusion   Intravenous Once Katragadda, Sreedhar, MD        VITALS:  Last menstrual period 02/16/2015.   Wt Readings from  Last 3 Encounters:  04/03/24 119 lb 14.9 oz (54.4 kg)  03/13/24 118 lb 2.7 oz (53.6 kg)  02/14/24 121 lb 12.8 oz (55.2 kg)    There is no height or weight on file to calculate BMI.  Performance status (ECOG): 1 - Symptomatic but completely ambulatory  PHYSICAL EXAM:   GENERAL:alert, no distress and comfortable SKIN: skin color, texture, turgor are normal, no rashes or significant lesions LYMPH:  no palpable lymphadenopathy in the cervical, axillary or inguinal LUNGS: Wheezing bilaterally on ascultation HEART: regular rate & rhythm and no murmurs and no lower extremity edema ABDOMEN:abdomen soft, non-tender and normal bowel sounds Musculoskeletal:no cyanosis of digits and no clubbing  NEURO: alert & oriented x 3 with fluent speech   LABORATORY DATA:  I have reviewed the data as listed   Lab Results  Component Value Date   WBC 1.9 (L) 04/03/2024   NEUTROABS 1.3 (L) 04/03/2024   HGB 10.7 (L) 04/03/2024   HCT 32.4 (L) 04/03/2024   MCV 105.2 (H) 04/03/2024   PLT 66 (L) 04/03/2024      Chemistry      Component Value Date/Time   NA 139 04/03/2024 0854   NA 140 11/03/2015 0841   K 3.5 04/03/2024 0854   CL 104 04/03/2024 0854   CO2 20 (L) 04/03/2024 0854   BUN 11 04/03/2024 0854   BUN 10 11/03/2015 0841   CREATININE 0.87 04/03/2024 0854      Component Value Date/Time   CALCIUM  8.7 (L) 04/03/2024 0854   ALKPHOS 194 (H) 04/03/2024 0854   AST 27 04/03/2024 0854   ALT 12 04/03/2024 0854   BILITOT 2.5 (H) 04/03/2024 0854   BILITOT 2.1 (H) 11/03/2015 0841      Latest Reference Range & Units 02/14/24 09:39  CEA 0.0 - 4.7 ng/mL 7.7 (H)  (H): Data is abnormally high  RADIOGRAPHIC STUDIES: I have personally reviewed the radiological images as listed and agreed with the findings in the report.  NM PET Image Restage (PS) Skull Base to Thigh (F-18 FDG) CLINICAL DATA:  Subsequent treatment strategy for rectal cancer, with liver metastasis. Status post  chemotherapy including earlier this month.  EXAM: NUCLEAR MEDICINE PET SKULL BASE TO THIGH  TECHNIQUE: 5.8 mCi F-18 FDG was injected intravenously. Full-ring PET imaging was performed from the skull base to thigh after the radiotracer. CT data was obtained and used for attenuation correction and anatomic localization.  Fasting blood glucose: 88 mg/dl  COMPARISON:  89/83/7974  FINDINGS: Mediastinal blood pool activity: SUV max 2.8  Liver activity: SUV max NA  NECK: No areas of abnormal hypermetabolism.  Incidental CT findings: No cervical adenopathy.  CHEST: No thoracic nodal hypermetabolism.  Areas of bilateral upper and left lower lobe presumably radiation induced consolidation.  An index posterior left upper lobe pulmonary nodule measures 1.0 cm and SUV 6.0 on 83/202 versus 9 mm and SUV 4.8 on the prior.  Superior segment left lower lobe pulmonary nodule measures 1.3 cm and SUV 9.3 on 99/202. Compare 1.0 cm and SUV 8.1 on the prior.  An area of hypermetabolism within presumed radiation induced consolidation within the more inferior left lower lobe medially. Measures SUV 5.5 today versus SUV 4.7 on the prior.  Incidental CT findings: Partially cavitary right upper lobe pulmonary nodule of 6 mm on 77/202 measured 5 mm previously.  Left Port-A-Cath tip low SVC.  Ascending aortic aneurysm at 4.5 cm is unchanged.  Aortic and coronary artery calcification.  ABDOMEN/PELVIS: Central lateral segment left liver  lobe focus of mild hypermetabolism at SUV 5.1 on image 147. No well-defined correlate mass. SUV 3.6 on the prior.  No abdominopelvic nodal hypermetabolism.  Hypermetabolism about the terminal ileum and ileocecal junction at SUV 9.3, without CT abnormality.  Anorectal hypermetabolism is again nonspecific at SUV 5.9 today. SUV 5.4 on the prior.  Incidental CT findings: Splenomegaly. 1.4 cm right adrenal nodule is similar to the prior and not  hypermetabolic.  Surgical sutures about the anorectal junction. Fluid within the eccentric right mesorectum is similar.  SKELETON: No abnormal marrow activity.  Incidental CT findings: Osteopenia.  IMPRESSION: 1. Mild progression of pulmonary metastasis. 2. Increase in mild central lateral segment left liver lobe hypermetabolism, without well-defined CT correlate. Cannot exclude isolated metastasis. This could be re-evaluated at follow-up or more entirely characterized with pre and post contrast abdominal MRI. 3. Similar nonspecific anorectal hypermetabolism and ill-defined fluid within the perirectal fat. 4. New hypermetabolism within the terminal ileum and ileocecal junction, without CT correlate. Correlate with symptoms of enteritis. 5. Ascending aortic aneurysm of 4.5 cm, similar. This can be re-evaluated on routine staging CTs and/or PET. 6. Incidental findings, including: Splenomegaly. Coronary artery atherosclerosis. Aortic Atherosclerosis (ICD10-I70.0).  Electronically Signed   By: Rockey Kilts M.D.   On: 04/29/2024 10:24      "

## 2024-05-02 ENCOUNTER — Other Ambulatory Visit: Payer: Self-pay | Admitting: Radiation Therapy

## 2024-05-02 ENCOUNTER — Telehealth: Payer: Self-pay | Admitting: Radiation Oncology

## 2024-05-02 DIAGNOSIS — C7931 Secondary malignant neoplasm of brain: Secondary | ICD-10-CM

## 2024-05-02 LAB — CEA: CEA: 11.8 ng/mL — ABNORMAL HIGH (ref 0.0–4.7)

## 2024-05-02 NOTE — Telephone Encounter (Signed)
 I spoke with the patient and her husband after reviewing her PET scan with Dr. Dewey and speaking with Dr. Davonna. Dr. Dewey will fuse her PET with prior treatment plans but would still offer ultrahypofractionated radiation to the central LLL up to 10 fxns, and then SBRT to the 1.3 cm LLL. The LUL nodule seen has increased SUV so we will evaluate if that or the 6 mm RUL nodule need to also be treated. Dr. Davonna is in agreement with this plan and ideally we would avoid further delays in her chemotherapy but she is comfortable with the patient having her chemo break while getting ready for and having radiation. We will plan simulation 2/17, to start on 3/2 and anticipate finishing by 06/07/24, and the patient wants to have her chemotherapy resume on 06/19/24.

## 2024-05-03 ENCOUNTER — Inpatient Hospital Stay

## 2024-05-03 VITALS — BP 117/81 | HR 95 | Temp 97.6°F | Resp 19

## 2024-05-03 DIAGNOSIS — C20 Malignant neoplasm of rectum: Secondary | ICD-10-CM

## 2024-05-03 NOTE — Patient Instructions (Signed)
 [facility] - A DEPT OF [facility]  Discharge Instructions: Thank you for choosing [facility] to provide your oncology and hematology care.  If you have a lab appointment with the Cancer Center - please note that after April 8th, 2024, all labs will be drawn in the cancer center.  You do not have to check in or register with the main entrance as you have in the past but will complete your check-in in the cancer center.  Wear comfortable clothing and clothing appropriate for easy access to any Portacath or PICC line.   We strive to give you quality time with your provider. You may need to reschedule your appointment if you arrive late (15 or more minutes).  Arriving late affects you and other patients whose appointments are after yours.  Also, if you miss three or more appointments without notifying the office, you may be dismissed from the clinic at the providers discretion.      For prescription refill requests, have your pharmacy contact our office and allow 72 hours for refills to be completed.    Today you received the following chemotherapy and/or immunotherapy agents : disconnected continuous 5FU pump per protocol   To help prevent nausea and vomiting after your treatment, we encourage you to take your nausea medication as directed.  BELOW ARE SYMPTOMS THAT SHOULD BE REPORTED IMMEDIATELY: *FEVER GREATER THAN 100.4 F (38 C) OR HIGHER *CHILLS OR SWEATING *NAUSEA AND VOMITING THAT IS NOT CONTROLLED WITH YOUR NAUSEA MEDICATION *UNUSUAL SHORTNESS OF BREATH *UNUSUAL BRUISING OR BLEEDING *URINARY PROBLEMS (pain or burning when urinating, or frequent urination) *BOWEL PROBLEMS (unusual diarrhea, constipation, pain near the anus) TENDERNESS IN MOUTH AND THROAT WITH OR WITHOUT PRESENCE OF ULCERS (sore throat, sores in mouth, or a toothache) UNUSUAL RASH, SWELLING OR PAIN  UNUSUAL VAGINAL DISCHARGE OR ITCHING   Items with * indicate a potential  emergency and should be followed up as soon as possible or go to the Emergency Department if any problems should occur.  Please show the CHEMOTHERAPY ALERT CARD or IMMUNOTHERAPY ALERT CARD at check-in to the Emergency Department and triage nurse.  Should you have questions after your visit or need to cancel or reschedule your appointment, please contact [facility] [place] - A DEPT OF [facility] [phone]  and follow the prompts.  Office hours are 8:00 a.m. to 4:30 p.m. Monday - Friday. Please note that voicemails left after 4:00 p.m. may not be returned until the following business day.  We are closed weekends and major holidays. You have access to a nurse at all times for urgent questions. Please call the main number to the clinic [phone] and follow the prompts.  For any non-urgent questions, you may also contact your provider using MyChart. We now offer e-Visits for anyone 3 and older to request care online for non-urgent symptoms. For details visit [email or web address].   Also download the MyChart app! Go to the app store, search MyChart, open the app, select [facility], and log in with your MyChart username and password.

## 2024-05-03 NOTE — Progress Notes (Signed)
 Samantha Reynolds presents to have home infusion pump d/c'd and for port-a-cath deaccess with flush.  Portacath located left chest wall accessed with  H 20 needle.  Good blood return present. Portacath flushed with NS needle removed intact.  Procedure tolerated well and without incident.

## 2024-05-09 ENCOUNTER — Inpatient Hospital Stay

## 2024-05-14 ENCOUNTER — Ambulatory Visit: Admitting: Radiation Oncology

## 2024-05-27 ENCOUNTER — Ambulatory Visit: Admitting: Radiation Oncology

## 2024-05-28 ENCOUNTER — Ambulatory Visit

## 2024-05-29 ENCOUNTER — Inpatient Hospital Stay: Admitting: Oncology

## 2024-05-29 ENCOUNTER — Ambulatory Visit: Admitting: Radiation Oncology

## 2024-05-29 ENCOUNTER — Inpatient Hospital Stay

## 2024-05-30 ENCOUNTER — Ambulatory Visit

## 2024-05-31 ENCOUNTER — Ambulatory Visit: Admitting: Radiation Oncology

## 2024-05-31 ENCOUNTER — Inpatient Hospital Stay

## 2024-06-03 ENCOUNTER — Ambulatory Visit: Admitting: Radiation Oncology

## 2024-06-03 ENCOUNTER — Ambulatory Visit

## 2024-06-04 ENCOUNTER — Ambulatory Visit: Admitting: Radiation Oncology

## 2024-06-05 ENCOUNTER — Ambulatory Visit: Admitting: Radiation Oncology

## 2024-06-05 ENCOUNTER — Ambulatory Visit

## 2024-06-06 ENCOUNTER — Ambulatory Visit: Admitting: Radiation Oncology

## 2024-06-07 ENCOUNTER — Ambulatory Visit

## 2024-06-19 ENCOUNTER — Inpatient Hospital Stay

## 2024-06-19 ENCOUNTER — Inpatient Hospital Stay: Admitting: Oncology

## 2024-06-21 ENCOUNTER — Inpatient Hospital Stay
